# Patient Record
Sex: Female | Born: 1940
Health system: Southern US, Community
[De-identification: ages and names within clinical notes are randomized; demographics above are authoritative.]

## PROBLEM LIST (undated history)

## (undated) DIAGNOSIS — J45909 Unspecified asthma, uncomplicated: Secondary | ICD-10-CM

## (undated) DIAGNOSIS — S91105A Unspecified open wound of left lesser toe(s) without damage to nail, initial encounter: Secondary | ICD-10-CM

## (undated) DIAGNOSIS — K604 Rectal fistula, unspecified: Secondary | ICD-10-CM

## (undated) DIAGNOSIS — H409 Unspecified glaucoma: Secondary | ICD-10-CM

## (undated) DIAGNOSIS — F419 Anxiety disorder, unspecified: Secondary | ICD-10-CM

## (undated) DIAGNOSIS — R0600 Dyspnea, unspecified: Secondary | ICD-10-CM

## (undated) DIAGNOSIS — K589 Irritable bowel syndrome without diarrhea: Secondary | ICD-10-CM

## (undated) DIAGNOSIS — L309 Dermatitis, unspecified: Secondary | ICD-10-CM

## (undated) DIAGNOSIS — M419 Scoliosis, unspecified: Secondary | ICD-10-CM

## (undated) DIAGNOSIS — T7840XA Allergy, unspecified, initial encounter: Secondary | ICD-10-CM

## (undated) DIAGNOSIS — C50919 Malignant neoplasm of unspecified site of unspecified female breast: Secondary | ICD-10-CM

## (undated) DIAGNOSIS — Z9889 Other specified postprocedural states: Secondary | ICD-10-CM

## (undated) DIAGNOSIS — K760 Fatty (change of) liver, not elsewhere classified: Secondary | ICD-10-CM

## (undated) DIAGNOSIS — K5792 Diverticulitis of intestine, part unspecified, without perforation or abscess without bleeding: Secondary | ICD-10-CM

## (undated) DIAGNOSIS — R112 Nausea with vomiting, unspecified: Secondary | ICD-10-CM

## (undated) DIAGNOSIS — G629 Polyneuropathy, unspecified: Secondary | ICD-10-CM

## (undated) DIAGNOSIS — M8580 Other specified disorders of bone density and structure, unspecified site: Secondary | ICD-10-CM

## (undated) DIAGNOSIS — R531 Weakness: Secondary | ICD-10-CM

## (undated) DIAGNOSIS — D649 Anemia, unspecified: Secondary | ICD-10-CM

## (undated) DIAGNOSIS — M2040 Other hammer toe(s) (acquired), unspecified foot: Secondary | ICD-10-CM

## (undated) DIAGNOSIS — G479 Sleep disorder, unspecified: Secondary | ICD-10-CM

## (undated) DIAGNOSIS — E559 Vitamin D deficiency, unspecified: Secondary | ICD-10-CM

## (undated) DIAGNOSIS — B029 Zoster without complications: Secondary | ICD-10-CM

## (undated) DIAGNOSIS — N200 Calculus of kidney: Secondary | ICD-10-CM

## (undated) DIAGNOSIS — Q398 Other congenital malformations of esophagus: Secondary | ICD-10-CM

## (undated) DIAGNOSIS — I10 Essential (primary) hypertension: Secondary | ICD-10-CM

## (undated) DIAGNOSIS — N809 Endometriosis, unspecified: Secondary | ICD-10-CM

## (undated) DIAGNOSIS — E1169 Type 2 diabetes mellitus with other specified complication: Secondary | ICD-10-CM

## (undated) DIAGNOSIS — K2289 Other specified disease of esophagus: Secondary | ICD-10-CM

## (undated) DIAGNOSIS — M2550 Pain in unspecified joint: Secondary | ICD-10-CM

## (undated) DIAGNOSIS — Z87442 Personal history of urinary calculi: Secondary | ICD-10-CM

## (undated) DIAGNOSIS — G63 Polyneuropathy in diseases classified elsewhere: Secondary | ICD-10-CM

## (undated) DIAGNOSIS — H269 Unspecified cataract: Secondary | ICD-10-CM

## (undated) DIAGNOSIS — G2581 Restless legs syndrome: Secondary | ICD-10-CM

## (undated) DIAGNOSIS — M1990 Unspecified osteoarthritis, unspecified site: Secondary | ICD-10-CM

## (undated) DIAGNOSIS — C801 Malignant (primary) neoplasm, unspecified: Secondary | ICD-10-CM

## (undated) DIAGNOSIS — F329 Major depressive disorder, single episode, unspecified: Secondary | ICD-10-CM

## (undated) DIAGNOSIS — S32009A Unspecified fracture of unspecified lumbar vertebra, initial encounter for closed fracture: Secondary | ICD-10-CM

## (undated) DIAGNOSIS — M76829 Posterior tibial tendinitis, unspecified leg: Secondary | ICD-10-CM

## (undated) DIAGNOSIS — M869 Osteomyelitis, unspecified: Secondary | ICD-10-CM

## (undated) DIAGNOSIS — Q399 Congenital malformation of esophagus, unspecified: Secondary | ICD-10-CM

## (undated) DIAGNOSIS — K2970 Gastritis, unspecified, without bleeding: Secondary | ICD-10-CM

## (undated) DIAGNOSIS — R51 Headache: Secondary | ICD-10-CM

## (undated) DIAGNOSIS — E079 Disorder of thyroid, unspecified: Secondary | ICD-10-CM

## (undated) DIAGNOSIS — J479 Bronchiectasis, uncomplicated: Secondary | ICD-10-CM

## (undated) DIAGNOSIS — F32A Depression, unspecified: Secondary | ICD-10-CM

## (undated) HISTORY — DX: Gastritis, unspecified, without bleeding: K29.70

## (undated) HISTORY — PX: FOOT SURGERY: SHX648

## (undated) HISTORY — PX: OTHER SURGICAL HISTORY: SHX169

## (undated) HISTORY — PX: ADENOIDECTOMY: SUR15

## (undated) HISTORY — PX: DILATION AND CURETTAGE OF UTERUS: SHX78

## (undated) HISTORY — DX: Endometriosis, unspecified: N80.9

## (undated) HISTORY — DX: Disorder of thyroid, unspecified: E07.9

## (undated) HISTORY — PX: TONSILLECTOMY: SUR1361

## (undated) HISTORY — DX: Weakness: R53.1

## (undated) HISTORY — PX: HYPERBARIC OXYGEN THERAPY: SHX673

## (undated) HISTORY — DX: Polyneuropathy, unspecified: G62.9

## (undated) HISTORY — DX: Anemia, unspecified: D64.9

## (undated) HISTORY — PX: THYROIDECTOMY: SHX17

## (undated) HISTORY — PX: MASTECTOMY: SHX3

## (undated) HISTORY — DX: Anxiety disorder, unspecified: F41.9

## (undated) HISTORY — DX: Diverticulitis of intestine, part unspecified, without perforation or abscess without bleeding: K57.92

## (undated) HISTORY — DX: Zoster without complications: B02.9

## (undated) HISTORY — DX: Allergy, unspecified, initial encounter: T78.40XA

## (undated) HISTORY — PX: JOINT REPLACEMENT: SHX530

## (undated) HISTORY — PX: LAPAROTOMY: SHX154

## (undated) HISTORY — DX: Unspecified fracture of unspecified lumbar vertebra, initial encounter for closed fracture: S32.009A

## (undated) HISTORY — DX: Essential (primary) hypertension: I10

## (undated) HISTORY — PX: CATARACT EXTRACTION: SUR2

## (undated) HISTORY — DX: Calculus of kidney: N20.0

---
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
## 1973-05-10 HISTORY — PX: THYROIDECTOMY: SHX17

## 1975-05-11 HISTORY — PX: LAPAROTOMY: SHX154

## 1997-10-18 ENCOUNTER — Ambulatory Visit (HOSPITAL_COMMUNITY): Admission: RE | Admit: 1997-10-18 | Discharge: 1997-10-18 | Payer: Self-pay | Admitting: *Deleted

## 1998-09-08 ENCOUNTER — Other Ambulatory Visit: Admission: RE | Admit: 1998-09-08 | Discharge: 1998-09-08 | Payer: Self-pay | Admitting: *Deleted

## 1998-11-20 ENCOUNTER — Ambulatory Visit (HOSPITAL_COMMUNITY): Admission: RE | Admit: 1998-11-20 | Discharge: 1998-11-20 | Payer: Self-pay | Admitting: Internal Medicine

## 1999-07-02 ENCOUNTER — Ambulatory Visit (HOSPITAL_COMMUNITY): Admission: RE | Admit: 1999-07-02 | Discharge: 1999-07-02 | Payer: Self-pay | Admitting: Surgery

## 1999-07-02 ENCOUNTER — Encounter: Payer: Self-pay | Admitting: Surgery

## 1999-07-08 ENCOUNTER — Ambulatory Visit (HOSPITAL_COMMUNITY): Admission: RE | Admit: 1999-07-08 | Discharge: 1999-07-08 | Payer: Self-pay | Admitting: Surgery

## 1999-07-08 ENCOUNTER — Encounter: Payer: Self-pay | Admitting: Surgery

## 1999-07-13 ENCOUNTER — Encounter (INDEPENDENT_AMBULATORY_CARE_PROVIDER_SITE_OTHER): Payer: Self-pay

## 1999-07-13 ENCOUNTER — Ambulatory Visit (HOSPITAL_COMMUNITY): Admission: RE | Admit: 1999-07-13 | Discharge: 1999-07-13 | Payer: Self-pay | Admitting: Surgery

## 1999-09-11 ENCOUNTER — Ambulatory Visit (HOSPITAL_COMMUNITY): Admission: RE | Admit: 1999-09-11 | Discharge: 1999-09-11 | Payer: Self-pay | Admitting: Surgery

## 1999-09-11 ENCOUNTER — Encounter: Payer: Self-pay | Admitting: Surgery

## 1999-09-21 ENCOUNTER — Ambulatory Visit (HOSPITAL_COMMUNITY): Admission: RE | Admit: 1999-09-21 | Discharge: 1999-09-21 | Payer: Self-pay | Admitting: Surgery

## 1999-09-21 ENCOUNTER — Encounter: Payer: Self-pay | Admitting: Surgery

## 1999-09-25 ENCOUNTER — Other Ambulatory Visit: Admission: RE | Admit: 1999-09-25 | Discharge: 1999-09-25 | Payer: Self-pay | Admitting: *Deleted

## 2000-02-19 ENCOUNTER — Emergency Department (HOSPITAL_COMMUNITY): Admission: EM | Admit: 2000-02-19 | Discharge: 2000-02-19 | Payer: Self-pay | Admitting: Emergency Medicine

## 2000-03-24 ENCOUNTER — Encounter (INDEPENDENT_AMBULATORY_CARE_PROVIDER_SITE_OTHER): Payer: Self-pay | Admitting: Specialist

## 2000-03-24 ENCOUNTER — Ambulatory Visit (HOSPITAL_BASED_OUTPATIENT_CLINIC_OR_DEPARTMENT_OTHER): Admission: RE | Admit: 2000-03-24 | Discharge: 2000-03-24 | Payer: Self-pay | Admitting: Surgery

## 2000-09-28 ENCOUNTER — Other Ambulatory Visit: Admission: RE | Admit: 2000-09-28 | Discharge: 2000-09-28 | Payer: Self-pay | Admitting: *Deleted

## 2001-01-08 ENCOUNTER — Emergency Department (HOSPITAL_COMMUNITY): Admission: EM | Admit: 2001-01-08 | Discharge: 2001-01-08 | Payer: Self-pay | Admitting: Emergency Medicine

## 2001-07-12 ENCOUNTER — Encounter: Admission: RE | Admit: 2001-07-12 | Discharge: 2001-10-10 | Payer: Self-pay | Admitting: Internal Medicine

## 2001-10-04 ENCOUNTER — Other Ambulatory Visit: Admission: RE | Admit: 2001-10-04 | Discharge: 2001-10-04 | Payer: Self-pay | Admitting: *Deleted

## 2001-10-09 ENCOUNTER — Ambulatory Visit (HOSPITAL_COMMUNITY): Admission: RE | Admit: 2001-10-09 | Discharge: 2001-10-09 | Payer: Self-pay | Admitting: Oncology

## 2001-10-09 ENCOUNTER — Encounter: Payer: Self-pay | Admitting: Oncology

## 2002-10-10 ENCOUNTER — Other Ambulatory Visit: Admission: RE | Admit: 2002-10-10 | Discharge: 2002-10-10 | Payer: Self-pay | Admitting: *Deleted

## 2002-10-26 ENCOUNTER — Emergency Department (HOSPITAL_COMMUNITY): Admission: EM | Admit: 2002-10-26 | Discharge: 2002-10-26 | Payer: Self-pay | Admitting: Emergency Medicine

## 2002-11-02 ENCOUNTER — Encounter: Payer: Self-pay | Admitting: Internal Medicine

## 2002-11-02 ENCOUNTER — Encounter: Admission: RE | Admit: 2002-11-02 | Discharge: 2002-11-02 | Payer: Self-pay | Admitting: Internal Medicine

## 2002-11-05 ENCOUNTER — Encounter: Admission: RE | Admit: 2002-11-05 | Discharge: 2002-11-05 | Payer: Self-pay | Admitting: Neurosurgery

## 2003-10-11 ENCOUNTER — Other Ambulatory Visit: Admission: RE | Admit: 2003-10-11 | Discharge: 2003-10-11 | Payer: Self-pay | Admitting: *Deleted

## 2004-01-17 ENCOUNTER — Encounter: Admission: RE | Admit: 2004-01-17 | Discharge: 2004-01-17 | Payer: Self-pay | Admitting: Internal Medicine

## 2004-01-26 ENCOUNTER — Encounter: Admission: RE | Admit: 2004-01-26 | Discharge: 2004-01-26 | Payer: Self-pay | Admitting: Internal Medicine

## 2004-03-19 ENCOUNTER — Ambulatory Visit: Payer: Self-pay | Admitting: Internal Medicine

## 2004-06-15 ENCOUNTER — Ambulatory Visit: Payer: Self-pay | Admitting: Internal Medicine

## 2004-09-11 ENCOUNTER — Ambulatory Visit: Payer: Self-pay | Admitting: Internal Medicine

## 2004-10-01 ENCOUNTER — Ambulatory Visit: Payer: Self-pay | Admitting: Internal Medicine

## 2004-11-03 ENCOUNTER — Ambulatory Visit: Payer: Self-pay | Admitting: Internal Medicine

## 2004-11-12 ENCOUNTER — Ambulatory Visit: Payer: Self-pay | Admitting: Internal Medicine

## 2004-12-01 ENCOUNTER — Ambulatory Visit: Admission: RE | Admit: 2004-12-01 | Discharge: 2004-12-01 | Payer: Self-pay | Admitting: Gynecologic Oncology

## 2004-12-08 HISTORY — PX: LAPAROSCOPIC OOPHORECTOMY: SUR783

## 2004-12-22 ENCOUNTER — Ambulatory Visit (HOSPITAL_COMMUNITY): Admission: RE | Admit: 2004-12-22 | Discharge: 2004-12-22 | Payer: Self-pay | Admitting: Gynecologic Oncology

## 2004-12-22 ENCOUNTER — Encounter (INDEPENDENT_AMBULATORY_CARE_PROVIDER_SITE_OTHER): Payer: Self-pay | Admitting: *Deleted

## 2005-01-19 ENCOUNTER — Ambulatory Visit: Admission: RE | Admit: 2005-01-19 | Discharge: 2005-01-19 | Payer: Self-pay | Admitting: Gynecologic Oncology

## 2005-02-01 ENCOUNTER — Ambulatory Visit: Payer: Self-pay | Admitting: Internal Medicine

## 2005-02-08 ENCOUNTER — Ambulatory Visit: Payer: Self-pay | Admitting: Internal Medicine

## 2005-02-26 ENCOUNTER — Encounter: Admission: RE | Admit: 2005-02-26 | Discharge: 2005-02-26 | Payer: Self-pay | Admitting: Neurosurgery

## 2005-06-29 ENCOUNTER — Ambulatory Visit: Payer: Self-pay | Admitting: Internal Medicine

## 2005-07-12 ENCOUNTER — Ambulatory Visit: Payer: Self-pay | Admitting: Internal Medicine

## 2005-10-11 ENCOUNTER — Ambulatory Visit: Payer: Self-pay | Admitting: Internal Medicine

## 2005-10-14 ENCOUNTER — Other Ambulatory Visit: Admission: RE | Admit: 2005-10-14 | Discharge: 2005-10-14 | Payer: Self-pay | Admitting: Obstetrics & Gynecology

## 2005-10-22 ENCOUNTER — Ambulatory Visit: Payer: Self-pay | Admitting: Internal Medicine

## 2005-11-02 ENCOUNTER — Ambulatory Visit: Payer: Self-pay | Admitting: Internal Medicine

## 2005-11-02 HISTORY — PX: COLONOSCOPY: SHX174

## 2005-12-21 ENCOUNTER — Ambulatory Visit: Payer: Self-pay | Admitting: Internal Medicine

## 2005-12-28 ENCOUNTER — Ambulatory Visit: Payer: Self-pay | Admitting: Internal Medicine

## 2006-03-15 ENCOUNTER — Ambulatory Visit: Payer: Self-pay | Admitting: Internal Medicine

## 2006-03-22 ENCOUNTER — Ambulatory Visit: Payer: Self-pay | Admitting: Internal Medicine

## 2006-05-10 HISTORY — PX: OTHER SURGICAL HISTORY: SHX169

## 2006-07-22 ENCOUNTER — Ambulatory Visit: Payer: Self-pay | Admitting: Internal Medicine

## 2006-08-01 ENCOUNTER — Ambulatory Visit: Payer: Self-pay | Admitting: Internal Medicine

## 2006-08-01 LAB — CONVERTED CEMR LAB
Basophils Absolute: 0.1 10*3/uL (ref 0.0–0.1)
Eosinophils Relative: 2.3 % (ref 0.0–5.0)
HCT: 40.7 % (ref 36.0–46.0)
Hemoglobin: 13.5 g/dL (ref 12.0–15.0)
MCHC: 33.2 g/dL (ref 30.0–36.0)
MCV: 91.3 fL (ref 78.0–100.0)
Monocytes Absolute: 0.6 10*3/uL (ref 0.2–0.7)
Monocytes Relative: 7.5 % (ref 3.0–11.0)
Neutro Abs: 4.7 10*3/uL (ref 1.4–7.7)
Neutrophils Relative %: 64.3 % (ref 43.0–77.0)
Platelets: 280 10*3/uL (ref 150–400)
T4, Total: 7.9 ug/dL (ref 5.0–12.5)

## 2006-08-24 ENCOUNTER — Ambulatory Visit (HOSPITAL_BASED_OUTPATIENT_CLINIC_OR_DEPARTMENT_OTHER): Admission: RE | Admit: 2006-08-24 | Discharge: 2006-08-24 | Payer: Self-pay | Admitting: Internal Medicine

## 2006-09-06 ENCOUNTER — Ambulatory Visit: Payer: Self-pay | Admitting: Pulmonary Disease

## 2006-09-16 ENCOUNTER — Ambulatory Visit: Payer: Self-pay | Admitting: Internal Medicine

## 2006-10-20 ENCOUNTER — Other Ambulatory Visit: Admission: RE | Admit: 2006-10-20 | Discharge: 2006-10-20 | Payer: Self-pay | Admitting: Obstetrics and Gynecology

## 2006-10-28 ENCOUNTER — Ambulatory Visit: Payer: Self-pay | Admitting: Internal Medicine

## 2006-12-12 DIAGNOSIS — Z853 Personal history of malignant neoplasm of breast: Secondary | ICD-10-CM | POA: Insufficient documentation

## 2006-12-12 DIAGNOSIS — N951 Menopausal and female climacteric states: Secondary | ICD-10-CM | POA: Insufficient documentation

## 2006-12-12 DIAGNOSIS — M810 Age-related osteoporosis without current pathological fracture: Secondary | ICD-10-CM | POA: Insufficient documentation

## 2006-12-28 ENCOUNTER — Ambulatory Visit: Payer: Self-pay | Admitting: Internal Medicine

## 2006-12-28 DIAGNOSIS — G609 Hereditary and idiopathic neuropathy, unspecified: Secondary | ICD-10-CM | POA: Insufficient documentation

## 2006-12-28 DIAGNOSIS — E1129 Type 2 diabetes mellitus with other diabetic kidney complication: Secondary | ICD-10-CM | POA: Insufficient documentation

## 2006-12-28 DIAGNOSIS — E119 Type 2 diabetes mellitus without complications: Secondary | ICD-10-CM | POA: Insufficient documentation

## 2006-12-28 DIAGNOSIS — G608 Other hereditary and idiopathic neuropathies: Secondary | ICD-10-CM | POA: Insufficient documentation

## 2006-12-28 DIAGNOSIS — E1165 Type 2 diabetes mellitus with hyperglycemia: Secondary | ICD-10-CM | POA: Insufficient documentation

## 2006-12-28 DIAGNOSIS — G2581 Restless legs syndrome: Secondary | ICD-10-CM | POA: Insufficient documentation

## 2006-12-28 LAB — CONVERTED CEMR LAB
Folate: >20 ng/mL
Hgb A1c MFr Bld: 6.3 % — ABNORMAL HIGH (ref 4.6–6.0)

## 2006-12-29 ENCOUNTER — Ambulatory Visit: Payer: Self-pay | Admitting: Internal Medicine

## 2006-12-29 DIAGNOSIS — D518 Other vitamin B12 deficiency anemias: Secondary | ICD-10-CM | POA: Insufficient documentation

## 2007-01-12 ENCOUNTER — Ambulatory Visit: Payer: Self-pay | Admitting: Internal Medicine

## 2007-01-26 ENCOUNTER — Ambulatory Visit: Payer: Self-pay | Admitting: Internal Medicine

## 2007-02-02 ENCOUNTER — Encounter: Payer: Self-pay | Admitting: Internal Medicine

## 2007-02-21 ENCOUNTER — Encounter: Payer: Self-pay | Admitting: Internal Medicine

## 2007-02-21 ENCOUNTER — Encounter: Admission: RE | Admit: 2007-02-21 | Discharge: 2007-03-22 | Payer: Self-pay | Admitting: Internal Medicine

## 2007-03-01 ENCOUNTER — Ambulatory Visit: Payer: Self-pay | Admitting: Internal Medicine

## 2007-03-07 ENCOUNTER — Ambulatory Visit: Payer: Self-pay | Admitting: Internal Medicine

## 2007-03-07 LAB — CONVERTED CEMR LAB
Folate: >20 ng/mL
Hgb A1c MFr Bld: 6.5 % — ABNORMAL HIGH (ref 4.6–6.0)

## 2007-03-22 ENCOUNTER — Encounter: Payer: Self-pay | Admitting: Internal Medicine

## 2007-03-22 ENCOUNTER — Telehealth: Payer: Self-pay | Admitting: Internal Medicine

## 2007-03-30 ENCOUNTER — Ambulatory Visit: Payer: Self-pay | Admitting: Internal Medicine

## 2007-05-02 ENCOUNTER — Ambulatory Visit: Payer: Self-pay | Admitting: Internal Medicine

## 2007-05-02 LAB — CONVERTED CEMR LAB: Microalb Creat Ratio: 28.1 mg/g (ref 0.0–30.0)

## 2007-05-29 ENCOUNTER — Telehealth: Payer: Self-pay | Admitting: Internal Medicine

## 2007-06-02 ENCOUNTER — Ambulatory Visit: Payer: Self-pay | Admitting: Internal Medicine

## 2007-06-08 ENCOUNTER — Encounter: Payer: Self-pay | Admitting: Internal Medicine

## 2007-07-03 ENCOUNTER — Ambulatory Visit: Payer: Self-pay | Admitting: Internal Medicine

## 2007-07-18 ENCOUNTER — Ambulatory Visit: Payer: Self-pay | Admitting: Internal Medicine

## 2007-07-20 ENCOUNTER — Encounter: Admission: RE | Admit: 2007-07-20 | Discharge: 2007-07-20 | Payer: Self-pay | Admitting: Internal Medicine

## 2007-07-21 LAB — CONVERTED CEMR LAB
ALT: 21 units/L (ref 0–35)
AST: 18 units/L (ref 0–37)
Albumin: 3.7 g/dL (ref 3.5–5.2)
Alkaline Phosphatase: 70 units/L (ref 39–117)
Basophils Absolute: 0.1 10*3/uL (ref 0.0–0.1)
Basophils Relative: 0.7 % (ref 0.0–1.0)
Bilirubin, Direct: 0.1 mg/dL (ref 0.0–0.3)
Eosinophils Absolute: 0.2 10*3/uL (ref 0.0–0.6)
Eosinophils Relative: 3 % (ref 0.0–5.0)
Neutro Abs: 5 10*3/uL (ref 1.4–7.7)
Total Bilirubin: 0.5 mg/dL (ref 0.3–1.2)

## 2007-07-26 ENCOUNTER — Ambulatory Visit: Payer: Self-pay | Admitting: Internal Medicine

## 2007-07-26 DIAGNOSIS — E1169 Type 2 diabetes mellitus with other specified complication: Secondary | ICD-10-CM

## 2007-07-26 DIAGNOSIS — E785 Hyperlipidemia, unspecified: Secondary | ICD-10-CM | POA: Insufficient documentation

## 2007-07-26 HISTORY — DX: Type 2 diabetes mellitus with other specified complication: E11.69

## 2007-07-26 LAB — CONVERTED CEMR LAB
HDL: 43 mg/dL (ref >39.0)
LDL Cholesterol: 105 mg/dL — ABNORMAL HIGH (ref 0–99)
Total CHOL/HDL Ratio: 3.9
Triglycerides: 103 mg/dL (ref 0–149)

## 2007-08-01 ENCOUNTER — Telehealth: Payer: Self-pay | Admitting: Internal Medicine

## 2007-08-01 DIAGNOSIS — K8020 Calculus of gallbladder without cholecystitis without obstruction: Secondary | ICD-10-CM | POA: Insufficient documentation

## 2007-08-08 ENCOUNTER — Ambulatory Visit: Payer: Self-pay | Admitting: Internal Medicine

## 2007-08-09 ENCOUNTER — Ambulatory Visit: Payer: Self-pay | Admitting: Internal Medicine

## 2007-08-09 DIAGNOSIS — T887XXA Unspecified adverse effect of drug or medicament, initial encounter: Secondary | ICD-10-CM | POA: Insufficient documentation

## 2007-08-09 LAB — CONVERTED CEMR LAB: Creatinine, Ser: 0.8 mg/dL (ref 0.4–1.2)

## 2007-08-10 ENCOUNTER — Ambulatory Visit: Payer: Self-pay | Admitting: Internal Medicine

## 2007-08-14 ENCOUNTER — Telehealth: Payer: Self-pay | Admitting: Internal Medicine

## 2007-08-29 ENCOUNTER — Ambulatory Visit: Payer: Self-pay | Admitting: Internal Medicine

## 2007-09-20 ENCOUNTER — Ambulatory Visit: Payer: Self-pay | Admitting: Internal Medicine

## 2007-09-27 ENCOUNTER — Telehealth: Payer: Self-pay | Admitting: Internal Medicine

## 2007-10-06 ENCOUNTER — Encounter: Payer: Self-pay | Admitting: Internal Medicine

## 2007-10-16 ENCOUNTER — Ambulatory Visit: Payer: Self-pay | Admitting: Internal Medicine

## 2007-10-26 ENCOUNTER — Encounter: Payer: Self-pay | Admitting: Internal Medicine

## 2007-10-31 ENCOUNTER — Ambulatory Visit: Payer: Self-pay | Admitting: Internal Medicine

## 2007-10-31 LAB — CONVERTED CEMR LAB: Vitamin B-12: 400 pg/mL (ref 211–911)

## 2007-11-07 ENCOUNTER — Ambulatory Visit: Payer: Self-pay | Admitting: Internal Medicine

## 2007-11-13 ENCOUNTER — Telehealth: Payer: Self-pay | Admitting: Internal Medicine

## 2007-11-22 ENCOUNTER — Ambulatory Visit: Payer: Self-pay | Admitting: Internal Medicine

## 2007-11-30 ENCOUNTER — Telehealth: Payer: Self-pay

## 2007-12-08 ENCOUNTER — Observation Stay (HOSPITAL_COMMUNITY): Admission: EM | Admit: 2007-12-08 | Discharge: 2007-12-09 | Payer: Self-pay | Admitting: Emergency Medicine

## 2007-12-08 ENCOUNTER — Ambulatory Visit: Payer: Self-pay | Admitting: Internal Medicine

## 2007-12-11 ENCOUNTER — Encounter: Payer: Self-pay | Admitting: Internal Medicine

## 2007-12-13 ENCOUNTER — Ambulatory Visit: Payer: Self-pay | Admitting: Internal Medicine

## 2007-12-20 ENCOUNTER — Encounter: Admission: RE | Admit: 2007-12-20 | Discharge: 2007-12-20 | Payer: Self-pay | Admitting: Specialist

## 2007-12-20 ENCOUNTER — Encounter: Payer: Self-pay | Admitting: Internal Medicine

## 2007-12-25 ENCOUNTER — Ambulatory Visit: Payer: Self-pay | Admitting: Internal Medicine

## 2007-12-25 DIAGNOSIS — K219 Gastro-esophageal reflux disease without esophagitis: Secondary | ICD-10-CM | POA: Insufficient documentation

## 2007-12-27 ENCOUNTER — Encounter: Payer: Self-pay | Admitting: Internal Medicine

## 2008-01-03 ENCOUNTER — Ambulatory Visit: Payer: Self-pay | Admitting: Internal Medicine

## 2008-01-24 ENCOUNTER — Ambulatory Visit: Payer: Self-pay | Admitting: Internal Medicine

## 2008-02-13 ENCOUNTER — Ambulatory Visit: Payer: Self-pay | Admitting: Internal Medicine

## 2008-03-05 ENCOUNTER — Ambulatory Visit: Payer: Self-pay | Admitting: Internal Medicine

## 2008-03-06 ENCOUNTER — Encounter: Payer: Self-pay | Admitting: Internal Medicine

## 2008-03-14 ENCOUNTER — Telehealth: Payer: Self-pay | Admitting: Internal Medicine

## 2008-03-14 ENCOUNTER — Encounter: Payer: Self-pay | Admitting: Internal Medicine

## 2008-03-19 ENCOUNTER — Ambulatory Visit: Payer: Self-pay | Admitting: Internal Medicine

## 2008-03-19 LAB — CONVERTED CEMR LAB
BUN: 18 mg/dL (ref 6–23)
Calcium: 9.3 mg/dL (ref 8.4–10.5)
Chloride: 108 meq/L (ref 96–112)
GFR calc Af Amer: 107 mL/min
Potassium: 4.8 meq/L (ref 3.5–5.1)
Sodium: 145 meq/L (ref 135–145)

## 2008-03-27 ENCOUNTER — Ambulatory Visit: Payer: Self-pay | Admitting: Internal Medicine

## 2008-04-19 ENCOUNTER — Ambulatory Visit: Payer: Self-pay | Admitting: Internal Medicine

## 2008-05-15 ENCOUNTER — Ambulatory Visit: Payer: Self-pay | Admitting: Internal Medicine

## 2008-06-05 ENCOUNTER — Ambulatory Visit: Payer: Self-pay | Admitting: Internal Medicine

## 2008-06-10 HISTORY — PX: CHOLECYSTECTOMY: SHX55

## 2008-06-26 ENCOUNTER — Ambulatory Visit: Payer: Self-pay | Admitting: Internal Medicine

## 2008-07-02 ENCOUNTER — Encounter (INDEPENDENT_AMBULATORY_CARE_PROVIDER_SITE_OTHER): Payer: Self-pay | Admitting: Surgery

## 2008-07-02 ENCOUNTER — Ambulatory Visit (HOSPITAL_COMMUNITY): Admission: RE | Admit: 2008-07-02 | Discharge: 2008-07-03 | Payer: Self-pay | Admitting: Surgery

## 2008-07-18 ENCOUNTER — Ambulatory Visit: Payer: Self-pay | Admitting: Internal Medicine

## 2008-08-07 ENCOUNTER — Ambulatory Visit: Payer: Self-pay | Admitting: Internal Medicine

## 2008-08-07 LAB — CONVERTED CEMR LAB
Albumin: 3.6 g/dL (ref 3.5–5.2)
Alkaline Phosphatase: 86 units/L (ref 39–117)
BUN: 18 mg/dL (ref 6–23)
CO2: 30 meq/L (ref 19–32)
Chloride: 107 meq/L (ref 96–112)
Cholesterol: 168 mg/dL (ref 0–200)
Creatinine, Ser: 0.7 mg/dL (ref 0.4–1.2)
GFR calc non Af Amer: 88.3 mL/min (ref >60)
Glucose, Bld: 132 mg/dL — ABNORMAL HIGH (ref 70–99)
HDL: 52.7 mg/dL (ref >39.00)
Hgb A1c MFr Bld: 6.3 % (ref 4.6–6.5)
Potassium: 4 meq/L (ref 3.5–5.1)
TSH: 1.64 microintl units/mL (ref 0.35–5.50)
Total Protein: 6.7 g/dL (ref 6.0–8.3)

## 2008-08-12 ENCOUNTER — Encounter: Payer: Self-pay | Admitting: Internal Medicine

## 2008-08-12 ENCOUNTER — Encounter: Payer: Self-pay | Admitting: Nurse Practitioner

## 2008-08-14 ENCOUNTER — Ambulatory Visit: Payer: Self-pay | Admitting: Gastroenterology

## 2008-08-14 ENCOUNTER — Encounter: Payer: Self-pay | Admitting: Internal Medicine

## 2008-08-15 ENCOUNTER — Encounter: Payer: Self-pay | Admitting: Internal Medicine

## 2008-08-15 ENCOUNTER — Ambulatory Visit: Payer: Self-pay | Admitting: Internal Medicine

## 2008-08-17 ENCOUNTER — Encounter: Payer: Self-pay | Admitting: Internal Medicine

## 2008-08-21 ENCOUNTER — Ambulatory Visit: Payer: Self-pay | Admitting: Internal Medicine

## 2008-08-21 DIAGNOSIS — S32009A Unspecified fracture of unspecified lumbar vertebra, initial encounter for closed fracture: Secondary | ICD-10-CM | POA: Insufficient documentation

## 2008-08-21 HISTORY — DX: Unspecified fracture of unspecified lumbar vertebra, initial encounter for closed fracture: S32.009A

## 2008-08-21 LAB — CONVERTED CEMR LAB
HDL goal, serum: 40 mg/dL
LDL Goal: 100 mg/dL

## 2008-08-28 ENCOUNTER — Telehealth (INDEPENDENT_AMBULATORY_CARE_PROVIDER_SITE_OTHER): Payer: Self-pay | Admitting: *Deleted

## 2008-08-28 ENCOUNTER — Ambulatory Visit: Payer: Self-pay | Admitting: Internal Medicine

## 2008-08-28 ENCOUNTER — Telehealth: Payer: Self-pay | Admitting: Internal Medicine

## 2008-08-30 ENCOUNTER — Encounter: Payer: Self-pay | Admitting: Internal Medicine

## 2008-09-03 ENCOUNTER — Telehealth: Payer: Self-pay | Admitting: Internal Medicine

## 2008-09-09 ENCOUNTER — Encounter: Payer: Self-pay | Admitting: Internal Medicine

## 2008-09-18 ENCOUNTER — Ambulatory Visit: Payer: Self-pay | Admitting: Internal Medicine

## 2008-10-23 ENCOUNTER — Ambulatory Visit: Payer: Self-pay | Admitting: Internal Medicine

## 2008-11-20 ENCOUNTER — Ambulatory Visit: Payer: Self-pay | Admitting: Internal Medicine

## 2008-12-09 ENCOUNTER — Ambulatory Visit: Payer: Self-pay | Admitting: Internal Medicine

## 2008-12-16 ENCOUNTER — Telehealth (INDEPENDENT_AMBULATORY_CARE_PROVIDER_SITE_OTHER): Payer: Self-pay | Admitting: *Deleted

## 2008-12-16 ENCOUNTER — Ambulatory Visit: Payer: Self-pay | Admitting: Internal Medicine

## 2008-12-16 DIAGNOSIS — R10811 Right upper quadrant abdominal tenderness: Secondary | ICD-10-CM | POA: Insufficient documentation

## 2008-12-19 ENCOUNTER — Encounter: Admission: RE | Admit: 2008-12-19 | Discharge: 2008-12-19 | Payer: Self-pay | Admitting: Internal Medicine

## 2008-12-20 LAB — CONVERTED CEMR LAB
Alkaline Phosphatase: 72 units/L (ref 39–117)
Basophils Absolute: 0 10*3/uL (ref 0.0–0.1)
Bilirubin, Direct: 0.1 mg/dL (ref 0.0–0.3)
Eosinophils Absolute: 0.2 10*3/uL (ref 0.0–0.7)
Folate: >20 ng/mL
HCT: 37.8 % (ref 36.0–46.0)
Lymphocytes Relative: 25.5 % (ref 12.0–46.0)
Lymphs Abs: 1.6 10*3/uL (ref 0.7–4.0)
MCV: 92.1 fL (ref 78.0–100.0)
Neutrophils Relative %: 63.5 % (ref 43.0–77.0)
Platelets: 226 10*3/uL (ref 150.0–400.0)
RBC: 4.1 M/uL (ref 3.87–5.11)
Total Bilirubin: 0.5 mg/dL (ref 0.3–1.2)
Vitamin B-12: 418 pg/mL (ref 211–911)
WBC: 6.4 10*3/uL (ref 4.5–10.5)

## 2009-01-14 ENCOUNTER — Telehealth: Payer: Self-pay | Admitting: Internal Medicine

## 2009-01-15 ENCOUNTER — Ambulatory Visit: Payer: Self-pay | Admitting: Family Medicine

## 2009-02-05 ENCOUNTER — Ambulatory Visit: Payer: Self-pay | Admitting: Internal Medicine

## 2009-03-10 ENCOUNTER — Ambulatory Visit: Payer: Self-pay | Admitting: Internal Medicine

## 2009-03-18 ENCOUNTER — Ambulatory Visit: Payer: Self-pay | Admitting: Internal Medicine

## 2009-03-18 DIAGNOSIS — K760 Fatty (change of) liver, not elsewhere classified: Secondary | ICD-10-CM | POA: Insufficient documentation

## 2009-03-24 ENCOUNTER — Telehealth: Payer: Self-pay | Admitting: Internal Medicine

## 2009-03-31 ENCOUNTER — Ambulatory Visit: Payer: Self-pay | Admitting: Internal Medicine

## 2009-04-09 ENCOUNTER — Encounter (INDEPENDENT_AMBULATORY_CARE_PROVIDER_SITE_OTHER): Payer: Self-pay | Admitting: *Deleted

## 2009-04-16 ENCOUNTER — Ambulatory Visit: Payer: Self-pay | Admitting: Internal Medicine

## 2009-04-17 LAB — CONVERTED CEMR LAB
Bilirubin, Direct: 0 mg/dL (ref 0.0–0.3)
Direct LDL: 106.4 mg/dL
Pro B Natriuretic peptide (BNP): 22 pg/mL (ref 0.0–100.0)
Total Bilirubin: 0.5 mg/dL (ref 0.3–1.2)
Total Protein: 6.5 g/dL (ref 6.0–8.3)

## 2009-04-22 ENCOUNTER — Ambulatory Visit: Payer: Self-pay | Admitting: Internal Medicine

## 2009-05-21 ENCOUNTER — Ambulatory Visit: Payer: Self-pay | Admitting: Internal Medicine

## 2009-06-18 ENCOUNTER — Ambulatory Visit: Payer: Self-pay | Admitting: Internal Medicine

## 2009-07-16 ENCOUNTER — Ambulatory Visit: Payer: Self-pay | Admitting: Internal Medicine

## 2009-07-24 ENCOUNTER — Encounter: Payer: Self-pay | Admitting: Internal Medicine

## 2009-07-25 ENCOUNTER — Encounter: Payer: Self-pay | Admitting: Internal Medicine

## 2009-07-25 DIAGNOSIS — G4700 Insomnia, unspecified: Secondary | ICD-10-CM | POA: Insufficient documentation

## 2009-08-06 ENCOUNTER — Ambulatory Visit: Payer: Self-pay | Admitting: Internal Medicine

## 2009-08-27 ENCOUNTER — Ambulatory Visit: Payer: Self-pay | Admitting: Internal Medicine

## 2009-09-16 ENCOUNTER — Ambulatory Visit: Payer: Self-pay | Admitting: Internal Medicine

## 2009-09-16 ENCOUNTER — Telehealth: Payer: Self-pay | Admitting: Internal Medicine

## 2009-09-25 ENCOUNTER — Telehealth: Payer: Self-pay | Admitting: Internal Medicine

## 2009-09-26 ENCOUNTER — Encounter: Payer: Self-pay | Admitting: Internal Medicine

## 2009-10-07 ENCOUNTER — Telehealth: Payer: Self-pay | Admitting: Internal Medicine

## 2009-10-28 ENCOUNTER — Ambulatory Visit: Payer: Self-pay | Admitting: Internal Medicine

## 2009-11-18 ENCOUNTER — Ambulatory Visit: Payer: Self-pay | Admitting: Internal Medicine

## 2009-11-26 ENCOUNTER — Ambulatory Visit: Payer: Self-pay | Admitting: Family Medicine

## 2009-12-10 ENCOUNTER — Ambulatory Visit: Payer: Self-pay | Admitting: Internal Medicine

## 2009-12-12 ENCOUNTER — Emergency Department (HOSPITAL_COMMUNITY): Admission: EM | Admit: 2009-12-12 | Discharge: 2009-12-12 | Payer: Self-pay | Admitting: Emergency Medicine

## 2009-12-16 ENCOUNTER — Emergency Department (HOSPITAL_COMMUNITY): Admission: EM | Admit: 2009-12-16 | Discharge: 2009-12-16 | Payer: Self-pay | Admitting: Family Medicine

## 2009-12-22 ENCOUNTER — Encounter (HOSPITAL_BASED_OUTPATIENT_CLINIC_OR_DEPARTMENT_OTHER)
Admission: RE | Admit: 2009-12-22 | Discharge: 2010-02-07 | Payer: Self-pay | Source: Home / Self Care | Admitting: General Surgery

## 2009-12-24 ENCOUNTER — Ambulatory Visit (HOSPITAL_COMMUNITY): Admission: RE | Admit: 2009-12-24 | Discharge: 2009-12-24 | Payer: Self-pay | Admitting: General Surgery

## 2009-12-25 ENCOUNTER — Ambulatory Visit: Payer: Self-pay | Admitting: Internal Medicine

## 2009-12-26 ENCOUNTER — Ambulatory Visit: Payer: Self-pay | Admitting: Surgery

## 2010-01-05 ENCOUNTER — Ambulatory Visit (HOSPITAL_COMMUNITY): Admission: RE | Admit: 2010-01-05 | Discharge: 2010-01-05 | Payer: Self-pay | Admitting: General Surgery

## 2010-01-20 ENCOUNTER — Ambulatory Visit: Payer: Self-pay | Admitting: Internal Medicine

## 2010-02-09 ENCOUNTER — Encounter (HOSPITAL_BASED_OUTPATIENT_CLINIC_OR_DEPARTMENT_OTHER): Admission: RE | Admit: 2010-02-09 | Discharge: 2010-03-03 | Payer: Self-pay | Admitting: General Surgery

## 2010-02-10 ENCOUNTER — Ambulatory Visit: Payer: Self-pay | Admitting: Internal Medicine

## 2010-03-03 ENCOUNTER — Ambulatory Visit: Payer: Self-pay | Admitting: Internal Medicine

## 2010-03-09 ENCOUNTER — Encounter (HOSPITAL_BASED_OUTPATIENT_CLINIC_OR_DEPARTMENT_OTHER)
Admission: RE | Admit: 2010-03-09 | Discharge: 2010-04-10 | Payer: Self-pay | Source: Home / Self Care | Admitting: Internal Medicine

## 2010-03-26 ENCOUNTER — Ambulatory Visit: Payer: Self-pay | Admitting: Internal Medicine

## 2010-04-23 ENCOUNTER — Encounter: Payer: Self-pay | Admitting: Internal Medicine

## 2010-04-28 ENCOUNTER — Ambulatory Visit: Payer: Self-pay | Admitting: Internal Medicine

## 2010-05-19 ENCOUNTER — Ambulatory Visit
Admission: RE | Admit: 2010-05-19 | Discharge: 2010-05-19 | Payer: Self-pay | Source: Home / Self Care | Attending: Internal Medicine | Admitting: Internal Medicine

## 2010-05-31 ENCOUNTER — Encounter: Payer: Self-pay | Admitting: Neurosurgery

## 2010-05-31 ENCOUNTER — Encounter (HOSPITAL_BASED_OUTPATIENT_CLINIC_OR_DEPARTMENT_OTHER): Payer: Self-pay | Admitting: General Surgery

## 2010-06-09 ENCOUNTER — Ambulatory Visit
Admission: RE | Admit: 2010-06-09 | Discharge: 2010-06-09 | Payer: Self-pay | Source: Home / Self Care | Attending: Internal Medicine | Admitting: Internal Medicine

## 2010-06-09 NOTE — Assessment & Plan Note (Signed)
Summary: severe joint pain in foot/very sore and red/swollen/hurts to ...   Vital Signs:  Patient profile:   70 year old female Weight:      229 pounds Temp:     98 degrees F oral BP sitting:   140 / 80  (left arm) Cuff size:   large  Vitals Entered By: Sid Falcon LPN (November 26, 2009 12:08 PM)  History of Present Illness: Patient seen with one-week history of left foot pain.  No injury. Started after some increased ambulation. Pain is centered around the navicular region. Has noticed some slight swelling but no definite warmth. No definite history of gout. Denies other arthralgias. Pain worse with ambulation. Not improved with Tylenol. Has not tried any anti-inflammatories. No recent change of shoe wear. Pain is achy quality and moderate severity.  Allergies: 1)  ! Percocet 2)  Keflex (Cephalexin) 3)  Erythromycin Ethylsuccinate (Erythromycin Ethylsuccinate) 4)  Macrodantin (Nitrofurantoin Macrocrystal)  Past History:  Past Medical History: Last updated: 08/14/2008 Breast cancer, hx of Osteoporosis shingles Diabetes mellitus, type II neuropathy heel spurs Diverticulosis Heartburn PMH reviewed for relevance  Review of Systems  The patient denies anorexia, fever, and weight loss.    Physical Exam  General:  Well-developed,well-nourished,in no acute distress; alert,appropriate and cooperative throughout examination Extremities:  patient has some mild swelling left foot around the navicular region. No definite warmth. Very light pink color to skin no erythema. Mild tenderness to palpation. Good range of motion ankle with no distal tibia tenderness   Impression & Recommendations:  Problem # 1:  FOOT PAIN (ICD-729.5)  ?etiology.  ?post tibial tendonitis.  Doubt gout.  Trial of Aleve twice daily along with icing and 4 inch Ace wrap provided. Consider x-rays and further evaluation in 2 weeks if no better. Patient does have type 2 diabetes and history of neuropathy but  no history of Charcot joint changes  Orders: Ace Wraps 3-5 in/yard  (O5366)  Complete Medication List: 1)  Atenolol 25 Mg Tabs (Atenolol) .... Once daily 2)  Lunesta 3 Mg Tabs (Eszopiclone) .... At bedtime 3)  Mirapex 0.5 Mg Tabs (Pramipexole dihydrochloride) .... One by mouth q hs 4)  Fluoxetine Hcl 20 Mg Caps (Fluoxetine hcl) .... Once daily 5)  Calcium 600/vitamin D 600-200 Mg-unit Tabs (Calcium carbonate-vitamin d) .[name].. 1200 total perday 6)  Fish Oil Oil (Fish oil) .... Once daily 7)  Ester-c 500-550 Mg Tabs (Bioflavonoid products) .[name].. 1 once daily 8)  Bayer Low Strength 81 Mg Tbec (Aspirin) .... Once daily 9)  Co Q-10 120 Mg Caps (Coenzyme q10) .... Once daily 10)  Freestyle Lite Strp (Glucose blood) .... Qd 11)  Lumigan 0.03 % Soln (Bimatoprost) .[name].. 1 drop both eyes at bedtime 12)  Amaryl 4 Mg Tabs (Glimepiride) .... One tab daily 13)  Cobal-1000 1000 Mcg/ml Inj Soln (Cyanocobalamin) .... 1.5 ml every 3 weeks 14)  Omeprazole 20 Mg Cpdr (Omeprazole) .[name].. 1 once daily 15)  Estring 2 Mg Ring (Estradiol) .... As directed 16)  Centrum Silver Tabs (Multiple vitamins-minerals) .... Once daily 17)  Grape Seed Extract 100 Mg Caps (Grape seed) .... Take 150 mg daily 18)  Aspirin 81 Mg Tbec (Aspirin) .... Take as directed 19)  Ecpirin 325 Mg Tbec (Aspirin) .... Take on monday only 20)  Bentyl 20 Mg Tabs (Dicyclomine hcl) .... One by mouth two times a day 21)  Tylenol  .... 2 at bedtime 22)  Sm Flax Seed Oil 1000 Mg Caps (Flaxseed (linseed)) .[name].. 1 once daily 23)  Vitamin D 500  .... 2 once daily 24)  Silver Sulfadiazine 1 % Crea (Silver sulfadiazine) .... Aplly to site bid 25)  Cipro 500 Mg Tabs (Ciprofloxacin hcl) .... One by mouth two times a day x 10 days 26)  Flagyl 500 Mg Tabs (Metronidazole) .... One by mouth two times a day x 10 days  no alcohol

## 2010-06-09 NOTE — Medication Information (Signed)
Summary: Alfonso Patten Approved  Lunesta Approved   Imported By: Maryln Gottron 12/15/2009 10:46:01  _____________________________________________________________________  External Attachment:    Type:   Image     Comment:   External Document

## 2010-06-09 NOTE — Letter (Signed)
Summary: [facility]  [facility]   Imported By: Maryln Gottron 10/29/2009 12:44:05  _____________________________________________________________________  External Attachment:    Type:   Image     Comment:   External Document

## 2010-06-09 NOTE — Letter (Signed)
Summary: [facility]  [facility]   Imported By: Maryln Gottron 07/03/2009 12:14:35  _____________________________________________________________________  External Attachment:    Type:   Image     Comment:   External Document

## 2010-06-09 NOTE — Assessment & Plan Note (Signed)
Summary: b12 inj/njr  Nurse Visit   Allergies: 1)  ! Percocet 2)  Keflex (Cephalexin) 3)  Erythromycin Ethylsuccinate (Erythromycin Ethylsuccinate) 4)  Macrodantin (Nitrofurantoin Macrocrystal)  Medication Administration  Injection # 1:    Medication: Vit B12 1000 mcg    Diagnosis: ANEMIA, B12 DEFICIENCY (ICD-281.1)    Route: IM    Site: R deltoid    Exp Date: 03/06/2010    Lot #: [id number]    Mfr: American Regent    Comments: 1.71ml/1500mcg given    Patient tolerated injection without complications    Given by: Willy Eddy, LPN (December 10, 2009 12:32 PM)  Orders Added: 1)  Vit B12 1000 mcg [J3420] 2)  Admin of Therapeutic Inj  intramuscular or subcutaneous [[id number]]

## 2010-06-09 NOTE — Progress Notes (Signed)
Summary: Pt checking on status of refill for Amaryl  Phone Note Call from Patient   Caller: Patient Summary of Call: Pt called and wanted to check on status of refill for Amaryl. Pt is leaving to go out of town today and needs this called in to [facility] on [address] before she leaves today.  Initial call taken by: Lucy Antigua,  Oct 07, 2009 9:29 AM    Prescriptions: AMARYL 4 MG  TABS (GLIMEPIRIDE) one tab daily  #30 Each x 5   Entered by:   Rudy Jew, RN   Authorized by:   Stacie Glaze MD   Signed by:   Rudy Jew, RN on 10/07/2009   Method used:   Electronically to        [facility]  [phone]* (retail)       [address]       [place], [place]  [address]       Ph: [phone] or [phone]       Fax: [phone]   RxID:   [id number]

## 2010-06-09 NOTE — Assessment & Plan Note (Signed)
Summary: b-12 inj with bonnye/cjr  Nurse Visit   Allergies: 1)  ! Percocet 2)  Keflex (Cephalexin) 3)  Erythromycin Ethylsuccinate (Erythromycin Ethylsuccinate) 4)  Macrodantin (Nitrofurantoin Macrocrystal)  Medication Administration  Injection # 1:    Medication: Vit B12 1000 mcg    Diagnosis: ANEMIA, B12 DEFICIENCY (ICD-281.1)    Route: IM    Site: R deltoid    Exp Date: 12/08/2011    Lot #: [id number]    Mfr: American Regent    Comments: 1.35ml.1500mcg given    Patient tolerated injection without complications    Given by: Willy Eddy, LPN (February 10, 2010 4:10 PM)  Orders Added: 1)  Vit B12 1000 mcg [J3420] 2)  Admin of Therapeutic Inj  intramuscular or subcutaneous [04540]

## 2010-06-09 NOTE — Assessment & Plan Note (Signed)
Summary: b12 inj/njr  Nurse Visit   Allergies: 1)  ! Percocet 2)  Keflex (Cephalexin) 3)  Erythromycin Ethylsuccinate (Erythromycin Ethylsuccinate) 4)  Macrodantin (Nitrofurantoin Macrocrystal)  Medication Administration  Injection # 1:    Medication: Vit B12 1000 mcg    Diagnosis: ANEMIA, B12 DEFICIENCY (ICD-281.1)    Route: IM    Site: L deltoid    Exp Date: 02/06/2011    Lot #: [id number]    Mfr: American Regent    Comments: 1.5/1500mcg given    Patient tolerated injection without complications    Given by: Willy Eddy, LPN (August 27, 2009 12:15 PM)  Orders Added: 1)  Vit B12 1000 mcg [J3420] 2)  Admin of Therapeutic Inj  intramuscular or subcutaneous [16109]

## 2010-06-09 NOTE — Assessment & Plan Note (Signed)
Summary: B-12 INJ/CJR  Nurse Visit   Allergies: 1)  ! Percocet 2)  Keflex (Cephalexin) 3)  Erythromycin Ethylsuccinate (Erythromycin Ethylsuccinate) 4)  Macrodantin (Nitrofurantoin Macrocrystal)

## 2010-06-09 NOTE — Assessment & Plan Note (Signed)
Summary: B-12 INJ/CJR  Nurse Visit   Allergies: 1)  ! Percocet 2)  Keflex (Cephalexin) 3)  Erythromycin Ethylsuccinate (Erythromycin Ethylsuccinate) 4)  Macrodantin (Nitrofurantoin Macrocrystal)  Medication Administration  Injection # 1:    Medication: Vit B12 1000 mcg    Diagnosis: ANEMIA, B12 DEFICIENCY (ICD-281.1)    Route: IM    Site: R deltoid    Exp Date: 12/08/2011    Lot #: [id number]    Mfr: American Regent    Comments: 1.23ml given    Given by: Willy Eddy, LPN (January 20, 2010 12:13 PM)  Orders Added: 1)  Vit B12 1000 mcg [J3420] 2)  Admin of Therapeutic Inj  intramuscular or subcutaneous [96372] 3)  Flu Vaccine 56yrs + MEDICARE PATIENTS [Q2039] 4)  Administration Flu vaccine - MCR [G0008]  Review of Systems       Flu Vaccine Consent Questions     Do you have a history of severe allergic reactions to this vaccine? no    Any prior history of allergic reactions to egg and/or gelatin? no    Do you have a sensitivity to the preservative Thimersol? no    Do you have a past history of Guillan-Barre Syndrome? no    Do you currently have an acute febrile illness? no    Have you ever had a severe reaction to latex? no    Vaccine information given and explained to patient? yes    Are you currently pregnant? no    Lot Number:[id number]   Exp Date:11/07/2010   Site Given  Left Deltoid IM

## 2010-06-09 NOTE — Medication Information (Signed)
Summary: Prior Authorization Request and Approval for Lunesta  Prior Authorization Request and Approval for Lunesta   Imported By: Maryln Gottron 07/30/2009 15:06:53  _____________________________________________________________________  External Attachment:    Type:   Image     Comment:   External Document

## 2010-06-09 NOTE — Assessment & Plan Note (Signed)
Summary: B-12 INJ/CJR  Nurse Visit   Allergies: 1)  ! Percocet 2)  Keflex (Cephalexin) 3)  Erythromycin Ethylsuccinate (Erythromycin Ethylsuccinate) 4)  Macrodantin (Nitrofurantoin Macrocrystal)  Appended Document: Orders Update    Clinical Lists Changes  Orders: Added new Service order of Vit B12 1000 mcg (Z6109) - Signed Added new Service order of Admin of Therapeutic Inj  intramuscular or subcutaneous (60454) - Signed       Medication Administration  Injection # 1:    Medication: Vit B12 1000 mcg    Diagnosis: ANEMIA, B12 DEFICIENCY (ICD-281.1)    Route: IM    Site: L deltoid    Exp Date: 03/10/2011    Lot #: [id number]    Mfr: American Regent    Patient tolerated injection without complications    Given by: Willy Eddy, LPN (January 20, 2010 7:54 AM)  Orders Added: 1)  Vit B12 1000 mcg [J3420] 2)  Admin of Therapeutic Inj  intramuscular or subcutaneous [09811]

## 2010-06-09 NOTE — Assessment & Plan Note (Signed)
Summary: b12 with bonnye//ccm  Nurse Visit   Allergies: 1)  ! Percocet 2)  Keflex (Cephalexin) 3)  Erythromycin Ethylsuccinate (Erythromycin Ethylsuccinate) 4)  Macrodantin (Nitrofurantoin Macrocrystal)  Appended Document: Orders Update    Clinical Lists Changes  Orders: Added new Service order of Vit B12 1000 mcg (Z6109) - Signed Added new Service order of Admin of Therapeutic Inj  intramuscular or subcutaneous (60454) - Signed       Medication Administration  Injection # 1:    Medication: Vit B12 1000 mcg    Diagnosis: ANEMIA, B12 DEFICIENCY (ICD-281.1)    Route: IM    Site: L deltoid    Exp Date: 01/08/2011    Lot #: [id number]    Mfr: American Regent    Comments: 1.66ml.1500mcg given    Patient tolerated injection without complications    Given by: Willy Eddy, LPN (June 12, 2009 12:09 PM)  Orders Added: 1)  Vit B12 1000 mcg [J3420] 2)  Admin of Therapeutic Inj  intramuscular or subcutaneous [19147]

## 2010-06-09 NOTE — Progress Notes (Signed)
Summary: Pt req med called in for Diverticulitis  Phone Note Call from Patient Call back at Home Phone [phone]   Caller: Patient Summary of Call: Pt says her diverticulitis is flaring up and she would like a med called in to [facility] on [address].  Initial call taken by: Lucy Antigua,  Sep 16, 2009 8:31 AM  Follow-up for Phone Call        please triage Follow-up by: Willy Eddy, LPN,  Sep 16, 2009 8:36 AM  Additional Follow-up for Phone Call Additional follow up Details #1::        Pt ate popcorn last night, and has epigasric pain with fever of 102 today.  Feels just like diverticulitis episodes. Additional Follow-up by: Lynann Beaver CMA,  Sep 16, 2009 8:41 AM    Additional Follow-up for Phone Call Additional follow up Details #2::    per dr Lovell Sheehan- may have flagyl 500 two times a day for 10 days and cipro 500 two times a day for 10 days Follow-up by: Willy Eddy, LPN,  Sep 16, 2009 10:30 AM  New/Updated Medications: CIPRO 500 MG TABS (CIPROFLOXACIN HCL) one by mouth two times a day x 10 days FLAGYL 500 MG TABS (METRONIDAZOLE) one by mouth two times a day x 10 days  NO ALCOHOL Prescriptions: FLAGYL 500 MG TABS (METRONIDAZOLE) one by mouth two times a day x 10 days  NO ALCOHOL  #20 x 0   Entered by:   Lynann Beaver CMA   Authorized by:   Stacie Glaze MD   Signed by:   Lynann Beaver CMA on 09/16/2009   Method used:   Electronically to        [facility]  [phone]* (retail)       [address]       [place], [place]  [address]       Ph: [phone] or [phone]       Fax: [phone]   RxID:   [id number] CIPRO 500 MG TABS (CIPROFLOXACIN HCL) one by mouth two times a day x 10 days  #20 x 0   Entered by:   Lynann Beaver CMA   Authorized by:   Stacie Glaze MD   Signed by:   Lynann Beaver CMA on 09/16/2009   Method used:   Electronically to        [facility]  [phone]* (retail)       [address]       [place], [place]  [address]       Ph: [phone] or [phone]       Fax: [phone]   RxID:   [id number]  Pt notified.

## 2010-06-09 NOTE — Assessment & Plan Note (Signed)
Summary: b-12 inj/with bonnye/cjr  Nurse Visit   Allergies: 1)  ! Percocet 2)  Keflex (Cephalexin) 3)  Erythromycin Ethylsuccinate (Erythromycin Ethylsuccinate) 4)  Macrodantin (Nitrofurantoin Macrocrystal)  Medication Administration  Injection # 1:    Medication: Vit B12 1000 mcg    Diagnosis: ANEMIA, B12 DEFICIENCY (ICD-281.1)    Route: IM    Site: R deltoid    Exp Date: 12/08/2011    Lot #: [id number]    Mfr: American Regent    Comments: 1.71ml.1500mcg ggiven    Patient tolerated injection without complications    Given by: Willy Eddy, LPN (March 03, 2010 9:09 AM)  Orders Added: 1)  Vit B12 1000 mcg [J3420] 2)  Admin of Therapeutic Inj  intramuscular or subcutaneous [16109]

## 2010-06-09 NOTE — Medication Information (Signed)
Summary: Coverage Approval for Lunesta  Coverage Approval for Lunesta   Imported By: Maryln Gottron 07/31/2009 12:45:51  _____________________________________________________________________  External Attachment:    Type:   Image     Comment:   External Document

## 2010-06-09 NOTE — Assessment & Plan Note (Signed)
Summary: 2 MTH ROV // RS   Vital Signs:  Patient profile:   70 year old female Height:      71 inches Weight:      235 pounds BMI:     32.89 Temp:     98.2 degrees F oral Pulse rate:   76 / minute Resp:     14 per minute BP sitting:   134 / 80  (left arm) Cuff size:   large  Vitals Entered By: Willy Eddy, LPN (June 18, 2009 10:42 AM)  Nutrition Counseling: Patient's BMI is greater than 25 and therefore counseled on weight management options. CC: ROA   Primary Care Provider:  Darryll Capers, MD  CC:  ROA.  History of Present Illness: the pt presents for follow up for DM and HTN she has not taken any cbgs since her last report was good  Diabetes Management History:      The patient is a 70 years old female who comes in for evaluation of DM Type 2.  She has not been enrolled in the "Diabetic Education Program".  She states understanding of dietary principles and is following her diet appropriately.  No sensory loss is reported.  Self foot exams are being performed.  She is checking home blood sugars.  She says that she is exercising.  Type of exercise includes: walking.  Duration of exercise is estimated to be 15 min.  She is doing this 1 time per week.        Hypoglycemic symptoms are not occurring.  No hyperglycemic symptoms are reported.    Preventive Screening-Counseling & Management  Alcohol-Tobacco     Smoking Status: never     Passive Smoke Exposure: no  Current Problems (verified): 1)  Fatty Liver Disease  (ICD-571.8) 2)  Abdominal Tenderness, Right Upper Quadrant  (ICD-789.61) 3)  Compression Fracture, Lumbar Vertebrae  (ICD-805.4) 4)  Ruq Pain  (ICD-789.01) 5)  Need Prophylactic Vaccination&inoculation Flu  (ICD-V04.81) 6)  Gerd  (ICD-530.81) 7)  Uns Advrs Eff Uns Rx Medicinal&biological Sbstnc  (ICD-995.20) 8)  Acute Bronchitis  (ICD-466.0) 9)  Gallstones  (ICD-574.20) 10)  Hyperlipidemia  (ICD-272.4) 11)  Abdominal Pain Right Upper Quadrant   (ICD-789.01) 12)  Anemia, B12 Deficiency  (ICD-281.1) 13)  Restless Leg Syndrome, Mild  (ICD-333.94) 14)  Neuropathy, Idiopathic Peripheral Nec  (ICD-356.8) 15)  Diabetes Mellitus, Type II  (ICD-250.00) 16)  Menopausal Syndrome  (ICD-627.2) 17)  Osteoporosis  (ICD-733.00) 18)  Breast Cancer, Hx of  (ICD-V10.3)  Allergies: 1)  ! Percocet 2)  Keflex (Cephalexin) 3)  Erythromycin Ethylsuccinate (Erythromycin Ethylsuccinate) 4)  Macrodantin (Nitrofurantoin Macrocrystal)  Past History:  Family History: Last updated: Aug 21, 2008 mother RA died from bleeding complications father  COPD,CHF, HTN and hyperlipidemia  Social History: Last updated: 03/07/2007 Never Smoked Alcohol use-no Drug use-no  Risk Factors: Exercise: yes (03/07/2007)  Risk Factors: Smoking Status: never (06/18/2009) Passive Smoke Exposure: no (06/18/2009)  Past medical, surgical, family and social histories (including risk factors) reviewed, and no changes noted (except as noted below).  Past Medical History: Reviewed history from August 21, 2008 and no changes required. Breast cancer, hx of Osteoporosis shingles Diabetes mellitus, type II neuropathy heel spurs Diverticulosis Heartburn  Past Surgical History: Reviewed history from Aug 21, 2008 and no changes required. [id number] TAH-2005 Colonoscopy-10/2005 Thyroidectomy Tonsillectomy Cholecystectomy 06/2008  Family History: Reviewed history from 2008-08-21 and no changes required. mother RA died from bleeding complications father  COPD,CHF, HTN and hyperlipidemia  Social History: Reviewed history from 03/07/2007 and no  changes required. Never Smoked Alcohol use-no Drug use-no  Review of Systems  The patient denies anorexia, fever, weight loss, weight gain, vision loss, decreased hearing, hoarseness, chest pain, syncope, dyspnea on exertion, peripheral edema, prolonged cough, headaches, hemoptysis, abdominal pain, melena, hematochezia,  severe indigestion/heartburn, hematuria, incontinence, genital sores, muscle weakness, suspicious skin lesions, transient blindness, difficulty walking, depression, unusual weight change, abnormal bleeding, enlarged lymph nodes, angioedema, and breast masses.    Physical Exam  General:  Well developed, well nourished, no acute distress. Head:  Normocephalic and atraumatic. Eyes:  pupils equal and pupils reactive to light.   Ears:  R ear normal and L ear normal.   Nose:  no external deformity and no nasal discharge.   Mouth:  good dentition and pharynx pink and moist.   Neck:  Supple; no masses. Lungs:  Normal respiratory effort, chest expands symmetrically. Lungs are clear to auscultation, no crackles or wheezes. Heart:  normal rate and regular rhythm.   Abdomen:  soft, non-tender, normal bowel sounds, and no distention.    Diabetes Management Exam:    Foot Exam (with socks and/or shoes not present):       Sensory-Pinprick/Light touch:          Left medial foot (L-4): diminished          Left dorsal foot (L-5): diminished          Left lateral foot (S-1): diminished          Right medial foot (L-4): diminished          Right dorsal foot (L-5): diminished          Right lateral foot (S-1): diminished   Impression & Recommendations:  Problem # 1:  DIABETES MELLITUS, TYPE II (ICD-250.00) reveiwed the aic and she is at goal Her updated medication list for this problem includes:    Bayer Low Strength 81 Mg Tbec (Aspirin) ..... Once daily    Amaryl 4 Mg Tabs (Glimepiride) ..... One tab daily    Aspirin 81 Mg Tbec (Aspirin) .[name]... Take as directed    Ecpirin 325 Mg Tbec (Aspirin) .[name]... Take on monday only  Labs Reviewed: Creat: 0.7 (08/07/2008)     Last Eye Exam: glaucoma (08/22/2008) Reviewed HgBA1c results: 6.2 (04/17/2009)  6.2 (12/09/2008)  Problem # 2:  HYPERLIPIDEMIA (ICD-272.4) set goal of 215 Labs Reviewed: SGOT: 22 (04/17/2009)   SGPT: 25 (04/17/2009)  Lipid  Goals: Chol Goal: 200 (08/21/2008)   HDL Goal: 40 (08/21/2008)   LDL Goal: 100 (08/21/2008)   TG Goal: 150 (08/21/2008)  Prior 10 Yr Risk Heart Disease: 13 % (03/18/2009)   HDL:41.50 (04/17/2009), 52.70 (08/07/2008)  LDL:99 (08/07/2008), 105 (63/05/6008)  Chol:166 (04/17/2009), 168 (08/07/2008)  Trig:94.0 (04/17/2009), 81.0 (08/07/2008)  Problem # 3:  FATTY LIVER DISEASE (ICD-571.8) set goal weight of 215  Complete Medication List: 1)  Atenolol 25 Mg Tabs (Atenolol) .... Once daily 2)  Lunesta 3 Mg Tabs (Eszopiclone) .... At bedtime 3)  Mirapex 0.5 Mg Tabs (Pramipexole dihydrochloride) .... One by mouth q hs 4)  Fluoxetine Hcl 20 Mg Caps (Fluoxetine hcl) .... Once daily 5)  Calcium 600/vitamin D 600-200 Mg-unit Tabs (Calcium carbonate-vitamin d) .[name].. 1200 total perday 6)  Fish Oil Oil (Fish oil) .... Once daily 7)  Ester-c 500-550 Mg Tabs (Bioflavonoid products) .[name].. 1 once daily 8)  Bayer Low Strength 81 Mg Tbec (Aspirin) .... Once daily 9)  Co Q-10 120 Mg Caps (Coenzyme q10) .... Once daily 10)  Freestyle Lite Strp (Glucose blood) .Marland KitchenMarland KitchenMarland Kitchen  Qd 11)  Lumigan 0.03 % Soln (Bimatoprost) .[name].. 1 drop both eyes at bedtime 12)  Amaryl 4 Mg Tabs (Glimepiride) .... One tab daily 13)  Cobal-1000 1000 Mcg/ml Inj Soln (Cyanocobalamin) .... 1.5 ml every 3 weeks 14)  Omeprazole 20 Mg Cpdr (Omeprazole) .[name].. 1 once daily 15)  Estring 2 Mg Ring (Estradiol) .... As directed 16)  Centrum Silver Tabs (Multiple vitamins-minerals) .... Once daily 17)  Grape Seed Extract 100 Mg Caps (Grape seed) .... Take 150 mg daily 18)  Aspirin 81 Mg Tbec (Aspirin) .... Take as directed 19)  Ecpirin 325 Mg Tbec (Aspirin) .... Take on monday only 20)  Bentyl 20 Mg Tabs (Dicyclomine hcl) .... One by mouth two times a day 21)  Tylenol  .... 2 at bedtime 22)  Sm Flax Seed Oil 1000 Mg Caps (Flaxseed (linseed)) .[name].. 1 once daily 23)  Vitamin D 500  .... 2 once daily 24)  Silver Sulfadiazine 1 % Crea (Silver sulfadiazine) ....  Aplly to site bid  Other Orders: Admin of Therapeutic Inj  intramuscular or subcutaneous ([address]75) Vit B12 1000 mcg (I4332)  Diabetes Management Assessment/Plan:      The following lipid goals have been established for the patient: Total cholesterol goal of 200; LDL cholesterol goal of 100; HDL cholesterol goal of 40; Triglyceride goal of 150.    Patient Instructions: 1)  Please schedule a follow-up appointment in 4 months. 2)  BMP prior to visit, ICD-9:401.9 3)  Hepatic Panel prior to visit, ICD-9:995.20 4)  Lipid Panel prior to visit, ICD-9:272.4 5)  TSH prior to visit, ICD-9:244.9 6)  HbgA1C prior to visit, ICD-9:250.00   Medication Administration  Injection # 1:    Medication: Vit B12 1000 mcg    Diagnosis: ANEMIA, B12 DEFICIENCY (ICD-281.1)    Route: IM    Site: L deltoid    Exp Date: 01/09/2011    Lot #: [id number]    Mfr: American Regent    Comments: 1.5ML/1500MCG GIVEN    Patient tolerated injection without complications    Given by: Willy Eddy, LPN (June 18, 2009 10:45 AM)  Orders Added: 1)  Admin of Therapeutic Inj  intramuscular or subcutaneous [96372] 2)  Vit B12 1000 mcg [J3420] 3)  Est. Patient Level IV [84166]

## 2010-06-09 NOTE — Assessment & Plan Note (Signed)
Summary: B12 INJ // RS  Nurse Visit   Allergies: 1)  ! Percocet 2)  Keflex (Cephalexin) 3)  Erythromycin Ethylsuccinate (Erythromycin Ethylsuccinate) 4)  Macrodantin (Nitrofurantoin Macrocrystal)  Medication Administration  Injection # 1:    Medication: Vit B12 1000 mcg    Diagnosis: ANEMIA, B12 DEFICIENCY (ICD-281.1)    Route: IM    Site: R deltoid    Exp Date: 02/06/2011    Lot #: [id number]    Mfr: American Regent    Patient tolerated injection without complications    Given by: Willy Eddy, LPN (July 17, 9602 12:18 PM)  Orders Added: 1)  Vit B12 1000 mcg [J3420] 2)  Admin of Therapeutic Inj  intramuscular or subcutaneous [96372]  Appended Document: Orders Update    Clinical Lists Changes  Problems: Added new problem of INSOMNIA, CHRONIC (ICD-307.42)

## 2010-06-09 NOTE — Assessment & Plan Note (Signed)
Summary: b-12 inj/cjr  Nurse Visit   Allergies: 1)  ! Percocet 2)  Keflex (Cephalexin) 3)  Erythromycin Ethylsuccinate (Erythromycin Ethylsuccinate) 4)  Macrodantin (Nitrofurantoin Macrocrystal)  Appended Document: Orders Update    Clinical Lists Changes  Orders: Added new Service order of Vit B12 1000 mcg (E4540) - Signed Added new Service order of Admin of Therapeutic Inj  intramuscular or subcutaneous (98119) - Signed       Medication Administration  Injection # 1:    Medication: Vit B12 1000 mcg    Diagnosis: ANEMIA, B12 DEFICIENCY (ICD-281.1)    Route: IM    Site: R deltoid    Exp Date: 12/08/2010    Lot #: [id number]    Mfr: American Regent    Comments: 1.62ml given    Patient tolerated injection without complications    Given by: Willy Eddy, LPN (March 27, 2010 9:14 AM)  Orders Added: 1)  Vit B12 1000 mcg [J3420] 2)  Admin of Therapeutic Inj  intramuscular or subcutaneous [[id number]]

## 2010-06-09 NOTE — Progress Notes (Signed)
Summary: loose stools and concentrated Urine  Phone Note Call from Patient   Caller: Patient Call For: Stacie Glaze MD Summary of Call: Pt is finishing 10 days of Cipro and Flagyl today, and diverticulitis symptoms are much better.  She is concerned about continued loose stools, and very concentrated Urine.  Leaving for beach Monday, and wants to know if she needs to be seen or do something differently. [phone] Initial call taken by: Lynann Beaver CMA,  Sep 25, 2009 10:01 AM  Follow-up for Phone Call        cipro can cause diarrhea- drink plenty of water and it will get better-watch diet and try to stay on liquids for a few days per dr Lovell Sheehan- pt informed Follow-up by: Willy Eddy, LPN,  Sep 25, 2009 12:17 PM

## 2010-06-09 NOTE — Assessment & Plan Note (Signed)
Summary: b12//ccm/pt rsc/cjr  Nurse Visit   Allergies: 1)  ! Percocet 2)  Keflex (Cephalexin) 3)  Erythromycin Ethylsuccinate (Erythromycin Ethylsuccinate) 4)  Macrodantin (Nitrofurantoin Macrocrystal)  Medication Administration  Injection # 1:    Medication: Vit B12 1000 mcg    Diagnosis: ANEMIA, B12 DEFICIENCY (ICD-281.1)    Route: IM    Site: L deltoid    Exp Date: 02/06/2011    Lot #: [id number]    Mfr: American Regent    Patient tolerated injection without complications    Given by: Willy Eddy, LPN (Sep 16, 2009 12:05 PM)  Orders Added: 1)  Vit B12 1000 mcg [J3420] 2)  Admin of Therapeutic Inj  intramuscular or subcutaneous [[id number]]

## 2010-06-09 NOTE — Assessment & Plan Note (Signed)
Summary: B12 INJ // RS  Nurse Visit   Allergies: 1)  ! Percocet 2)  Keflex (Cephalexin) 3)  Erythromycin Ethylsuccinate (Erythromycin Ethylsuccinate) 4)  Macrodantin (Nitrofurantoin Macrocrystal)  Medication Administration  Injection # 1:    Medication: Vit B12 1000 mcg    Diagnosis: ANEMIA, B12 DEFICIENCY (ICD-281.1)    Route: IM    Site: L deltoid    Exp Date: 02/05/2011    Lot #: [id number]    Mfr: American Regent    Comments: 1.18ml/1500mg     Patient tolerated injection without complications    Given by: Willy Eddy, LPN (October 28, 2009 12:21 PM)  Orders Added: 1)  Vit B12 1000 mcg [J3420] 2)  Admin of Therapeutic Inj  intramuscular or subcutaneous [[id number]]

## 2010-06-09 NOTE — Assessment & Plan Note (Signed)
Summary: B12 INJ // RS  Nurse Visit   Allergies: 1)  ! Percocet 2)  Keflex (Cephalexin) 3)  Erythromycin Ethylsuccinate (Erythromycin Ethylsuccinate) 4)  Macrodantin (Nitrofurantoin Macrocrystal)  Appended Document: Orders Update    Clinical Lists Changes  Orders: Added new Service order of Vit B12 1000 mcg (E4540) - Signed Added new Service order of Admin of Therapeutic Inj  intramuscular or subcutaneous (98119) - Signed       Medication Administration  Injection # 1:    Medication: Vit B12 1000 mcg    Diagnosis: ANEMIA, B12 DEFICIENCY (ICD-281.1)    Route: IM    Site: R deltoid    Exp Date: 03/10/2011    Lot #: [id number]    Mfr: American Regent    Comments: 1.33ml/1500mcg given    Patient tolerated injection without complications    Given by: Willy Eddy, LPN (August 12, 1476 8:15 AM)  Orders Added: 1)  Vit B12 1000 mcg [J3420] 2)  Admin of Therapeutic Inj  intramuscular or subcutaneous [[id number]]

## 2010-06-11 NOTE — Assessment & Plan Note (Signed)
Summary: b-12 inj/cjr  Nurse Visit   Allergies: 1)  ! Percocet 2)  Keflex (Cephalexin) 3)  Erythromycin Ethylsuccinate (Erythromycin Ethylsuccinate) 4)  Macrodantin (Nitrofurantoin Macrocrystal)  Medication Administration  Injection # 1:    Medication: Vit B12 1000 mcg    Diagnosis: ANEMIA, B12 DEFICIENCY (ICD-281.1)    Route: IM    Site: L deltoid    Exp Date: 12/08/2011    Lot #: [id number]    Mfr: American Regent    Comments: 1.95ml/1500mg  given    Patient tolerated injection without complications    Given by: Willy Eddy, LPN (May 19, 2010 12:13 PM)  Orders Added: 1)  Vit B12 1000 mcg [J3420] 2)  Admin of Therapeutic Inj  intramuscular or subcutaneous [04540]

## 2010-06-11 NOTE — Assessment & Plan Note (Signed)
Summary: b12 inj//ccm  Nurse Visit   Allergies: 1)  ! Percocet 2)  Keflex (Cephalexin) 3)  Erythromycin Ethylsuccinate (Erythromycin Ethylsuccinate) 4)  Macrodantin (Nitrofurantoin Macrocrystal)  Medication Administration  Injection # 1:    Medication: Vit B12 1000 mcg    Diagnosis: ANEMIA, B12 DEFICIENCY (ICD-281.1)    Route: IM    Site: R deltoid    Exp Date: 11/2011    Lot #: [id number]    Mfr: American Regent    Patient tolerated injection without complications    Given by: Brenton Grills CMA Duncan Dull) (April 28, 2010 12:29 PM)  Orders Added: 1)  Admin of Therapeutic Inj  intramuscular or subcutaneous [96372] 2)  Vit B12 1000 mcg [J3420]

## 2010-06-11 NOTE — Letter (Signed)
Summary: [facility]  [facility]   Imported By: Maryln Gottron 05/15/2010 14:25:38  _____________________________________________________________________  External Attachment:    Type:   Image     Comment:   External Document

## 2010-06-17 ENCOUNTER — Telehealth: Payer: Self-pay | Admitting: *Deleted

## 2010-06-17 DIAGNOSIS — I10 Essential (primary) hypertension: Secondary | ICD-10-CM

## 2010-06-17 DIAGNOSIS — E538 Deficiency of other specified B group vitamins: Secondary | ICD-10-CM

## 2010-06-17 DIAGNOSIS — E785 Hyperlipidemia, unspecified: Secondary | ICD-10-CM

## 2010-06-17 NOTE — Telephone Encounter (Signed)
We'll check a lipid panel a basic metabolic panel a CBC and a B12 level prior to her office visit

## 2010-06-17 NOTE — Telephone Encounter (Signed)
Please schedule with pt--thanks

## 2010-06-17 NOTE — Telephone Encounter (Signed)
Pt needs to know if she should schedule labs before her next appt in March.

## 2010-06-17 NOTE — Assessment & Plan Note (Signed)
Summary: b-12 inj/cjr  Nurse Visit   Allergies: 1)  ! Percocet 2)  Keflex (Cephalexin) 3)  Erythromycin Ethylsuccinate (Erythromycin Ethylsuccinate) 4)  Macrodantin (Nitrofurantoin Macrocrystal)  Medication Administration  Injection # 1:    Medication: Vit B12 1000 mcg    Diagnosis: ANEMIA, B12 DEFICIENCY (ICD-281.1)    Route: IM    Site: L deltoid    Exp Date: 01/07/2012    Lot #: [id number]    Mfr: American Regent    Patient tolerated injection without complications    Given by: Willy Eddy, LPN (June 09, 2010 12:23 PM)  Orders Added: 1)  Vit B12 1000 mcg [J3420] 2)  Admin of Therapeutic Inj  intramuscular or subcutaneous [[id number]]

## 2010-06-18 NOTE — Telephone Encounter (Signed)
Pt has been sch for labs on 07/10/10 10am as noted.

## 2010-06-18 NOTE — Telephone Encounter (Signed)
Lft message with pts spouse, for pt to call back to sch labs prior to ov in March. Waiting on call back.

## 2010-06-29 ENCOUNTER — Encounter: Payer: Self-pay | Admitting: Internal Medicine

## 2010-06-30 ENCOUNTER — Ambulatory Visit (INDEPENDENT_AMBULATORY_CARE_PROVIDER_SITE_OTHER): Payer: Medicare Other | Admitting: Internal Medicine

## 2010-06-30 DIAGNOSIS — D518 Other vitamin B12 deficiency anemias: Secondary | ICD-10-CM

## 2010-06-30 DIAGNOSIS — D519 Vitamin B12 deficiency anemia, unspecified: Secondary | ICD-10-CM

## 2010-06-30 MED ORDER — CYANOCOBALAMIN 1000 MCG/ML IJ SOLN
1000.0000 ug | Freq: Once | INTRAMUSCULAR | Status: AC
  Administered 2010-06-30: 1000 ug via INTRAMUSCULAR

## 2010-07-06 ENCOUNTER — Other Ambulatory Visit: Payer: Self-pay | Admitting: Internal Medicine

## 2010-07-07 ENCOUNTER — Other Ambulatory Visit: Payer: Self-pay | Admitting: *Deleted

## 2010-07-10 ENCOUNTER — Other Ambulatory Visit (INDEPENDENT_AMBULATORY_CARE_PROVIDER_SITE_OTHER): Payer: Medicare Other | Admitting: Internal Medicine

## 2010-07-10 DIAGNOSIS — D649 Anemia, unspecified: Secondary | ICD-10-CM

## 2010-07-10 DIAGNOSIS — E119 Type 2 diabetes mellitus without complications: Secondary | ICD-10-CM

## 2010-07-10 DIAGNOSIS — D519 Vitamin B12 deficiency anemia, unspecified: Secondary | ICD-10-CM

## 2010-07-10 DIAGNOSIS — E785 Hyperlipidemia, unspecified: Secondary | ICD-10-CM

## 2010-07-10 DIAGNOSIS — E538 Deficiency of other specified B group vitamins: Secondary | ICD-10-CM

## 2010-07-10 DIAGNOSIS — I10 Essential (primary) hypertension: Secondary | ICD-10-CM

## 2010-07-10 LAB — BASIC METABOLIC PANEL
CO2: 30 mEq/L (ref 19–32)
Chloride: 108 mEq/L (ref 96–112)
Creatinine, Ser: 0.8 mg/dL (ref 0.4–1.2)
Potassium: 4.9 mEq/L (ref 3.5–5.1)
Sodium: 144 mEq/L (ref 135–145)

## 2010-07-10 LAB — CBC WITH DIFFERENTIAL/PLATELET
Basophils Relative: 0.6 % (ref 0.0–3.0)
Eosinophils Absolute: 0.2 10*3/uL (ref 0.0–0.7)
Eosinophils Relative: 3.8 % (ref 0.0–5.0)
HCT: 37 % (ref 36.0–46.0)
Hemoglobin: 12.3 g/dL (ref 12.0–15.0)
Lymphs Abs: 1.6 10*3/uL (ref 0.7–4.0)
MCHC: 33.1 g/dL (ref 30.0–36.0)
MCV: 93.3 fl (ref 78.0–100.0)
Monocytes Absolute: 0.5 10*3/uL (ref 0.1–1.0)
Neutro Abs: 3.7 10*3/uL (ref 1.4–7.7)
RBC: 3.97 Mil/uL (ref 3.87–5.11)

## 2010-07-10 LAB — LIPID PANEL
Cholesterol: 164 mg/dL (ref 0–200)
LDL Cholesterol: 96 mg/dL (ref 0–99)
Total CHOL/HDL Ratio: 3

## 2010-07-20 ENCOUNTER — Encounter: Payer: Self-pay | Admitting: Internal Medicine

## 2010-07-20 ENCOUNTER — Ambulatory Visit: Payer: Medicare Other | Admitting: Internal Medicine

## 2010-07-20 DIAGNOSIS — E119 Type 2 diabetes mellitus without complications: Secondary | ICD-10-CM

## 2010-07-20 DIAGNOSIS — E785 Hyperlipidemia, unspecified: Secondary | ICD-10-CM

## 2010-07-20 DIAGNOSIS — G608 Other hereditary and idiopathic neuropathies: Secondary | ICD-10-CM

## 2010-07-20 DIAGNOSIS — G609 Hereditary and idiopathic neuropathy, unspecified: Secondary | ICD-10-CM

## 2010-07-20 DIAGNOSIS — K7689 Other specified diseases of liver: Secondary | ICD-10-CM

## 2010-07-20 DIAGNOSIS — M7750 Other enthesopathy of unspecified foot: Secondary | ICD-10-CM

## 2010-07-20 MED ORDER — SITAGLIPTIN PHOSPHATE 100 MG PO TABS: 100.0000 mg | ORAL_TABLET | Freq: Every day | ORAL | Status: DC

## 2010-07-20 NOTE — Assessment & Plan Note (Signed)
Liver functions are completely normal

## 2010-07-20 NOTE — Assessment & Plan Note (Signed)
The neuropathy posterior history of chemotherapy is stable not progressive but has not improved

## 2010-07-20 NOTE — Assessment & Plan Note (Signed)
She is not able to exercise and she has gained back weight both of these factors influencing hemoglobin A1c increasing to 6.8 and a fasting blood glucose being in the 140 range. The LAD and insulin sensitizers she cannot tolerate metformin therefore we will add Januvia 100 mg by mouth daily and will recheck

## 2010-07-22 LAB — GLUCOSE, CAPILLARY: Glucose-Capillary: 154 mg/dL — ABNORMAL HIGH (ref 70–99)

## 2010-07-23 LAB — GLUCOSE, CAPILLARY
Glucose-Capillary: 103 mg/dL — ABNORMAL HIGH (ref 70–99)
Glucose-Capillary: 108 mg/dL — ABNORMAL HIGH (ref 70–99)
Glucose-Capillary: 117 mg/dL — ABNORMAL HIGH (ref 70–99)
Glucose-Capillary: 124 mg/dL — ABNORMAL HIGH (ref 70–99)
Glucose-Capillary: 128 mg/dL — ABNORMAL HIGH (ref 70–99)
Glucose-Capillary: 130 mg/dL — ABNORMAL HIGH (ref 70–99)
Glucose-Capillary: 131 mg/dL — ABNORMAL HIGH (ref 70–99)
Glucose-Capillary: 132 mg/dL — ABNORMAL HIGH (ref 70–99)
Glucose-Capillary: 132 mg/dL — ABNORMAL HIGH (ref 70–99)
Glucose-Capillary: 136 mg/dL — ABNORMAL HIGH (ref 70–99)
Glucose-Capillary: 136 mg/dL — ABNORMAL HIGH (ref 70–99)
Glucose-Capillary: 137 mg/dL — ABNORMAL HIGH (ref 70–99)
Glucose-Capillary: 137 mg/dL — ABNORMAL HIGH (ref 70–99)
Glucose-Capillary: 138 mg/dL — ABNORMAL HIGH (ref 70–99)
Glucose-Capillary: 153 mg/dL — ABNORMAL HIGH (ref 70–99)
Glucose-Capillary: 166 mg/dL — ABNORMAL HIGH (ref 70–99)
Glucose-Capillary: 168 mg/dL — ABNORMAL HIGH (ref 70–99)
Glucose-Capillary: 170 mg/dL — ABNORMAL HIGH (ref 70–99)
Glucose-Capillary: 172 mg/dL — ABNORMAL HIGH (ref 70–99)
Glucose-Capillary: 172 mg/dL — ABNORMAL HIGH (ref 70–99)
Glucose-Capillary: 172 mg/dL — ABNORMAL HIGH (ref 70–99)
Glucose-Capillary: 179 mg/dL — ABNORMAL HIGH (ref 70–99)
Glucose-Capillary: 182 mg/dL — ABNORMAL HIGH (ref 70–99)
Glucose-Capillary: 187 mg/dL — ABNORMAL HIGH (ref 70–99)
Glucose-Capillary: 191 mg/dL — ABNORMAL HIGH (ref 70–99)
Glucose-Capillary: 196 mg/dL — ABNORMAL HIGH (ref 70–99)

## 2010-07-24 LAB — COMPREHENSIVE METABOLIC PANEL
AST: 19 U/L (ref 0–37)
Albumin: 3.6 g/dL (ref 3.5–5.2)
Alkaline Phosphatase: 76 U/L (ref 39–117)
Chloride: 105 mEq/L (ref 96–112)
GFR calc Af Amer: >60 mL/min (ref >60)
Potassium: 4.1 mEq/L (ref 3.5–5.1)
Sodium: 139 mEq/L (ref 135–145)
Total Bilirubin: 0.4 mg/dL (ref 0.3–1.2)
Total Protein: 6.5 g/dL (ref 6.0–8.3)

## 2010-07-24 LAB — DIFFERENTIAL
Basophils Absolute: 0 10*3/uL (ref 0.0–0.1)
Basophils Relative: 0 % (ref 0–1)
Eosinophils Relative: 3 % (ref 0–5)
Monocytes Absolute: 0.5 10*3/uL (ref 0.1–1.0)
Monocytes Relative: 7 % (ref 3–12)

## 2010-07-24 LAB — CBC
Platelets: 244 10*3/uL (ref 150–400)
RBC: 3.98 MIL/uL (ref 3.87–5.11)
RDW: 15.2 % (ref 11.5–15.5)
WBC: 7.3 10*3/uL (ref 4.0–10.5)

## 2010-07-24 LAB — HEMOGLOBIN A1C: Mean Plasma Glucose: 126 mg/dL — ABNORMAL HIGH (ref <117)

## 2010-08-03 ENCOUNTER — Other Ambulatory Visit: Payer: Self-pay | Admitting: Internal Medicine

## 2010-08-19 ENCOUNTER — Ambulatory Visit (INDEPENDENT_AMBULATORY_CARE_PROVIDER_SITE_OTHER): Payer: Medicare Other | Admitting: Internal Medicine

## 2010-08-19 DIAGNOSIS — E538 Deficiency of other specified B group vitamins: Secondary | ICD-10-CM

## 2010-08-19 LAB — GLUCOSE, CAPILLARY: Glucose-Capillary: 118 mg/dL — ABNORMAL HIGH (ref 70–99)

## 2010-08-19 MED ORDER — CYANOCOBALAMIN 1000 MCG/ML IJ SOLN
1000.0000 ug | Freq: Once | INTRAMUSCULAR | Status: AC
  Administered 2010-08-19: 1000 ug via INTRAMUSCULAR

## 2010-08-25 LAB — CBC
Hemoglobin: 12.3 g/dL (ref 12.0–15.0)
MCHC: 33.1 g/dL (ref 30.0–36.0)
MCV: 91.5 fL (ref 78.0–100.0)
RBC: 4.05 MIL/uL (ref 3.87–5.11)
WBC: 5 10*3/uL (ref 4.0–10.5)

## 2010-08-25 LAB — COMPREHENSIVE METABOLIC PANEL
ALT: 20 U/L (ref 0–35)
AST: 19 U/L (ref 0–37)
CO2: 25 mEq/L (ref 19–32)
Calcium: 9 mg/dL (ref 8.4–10.5)
Chloride: 108 mEq/L (ref 96–112)
GFR calc Af Amer: >60 mL/min (ref >60)
GFR calc non Af Amer: >60 mL/min (ref >60)
Glucose, Bld: 158 mg/dL — ABNORMAL HIGH (ref 70–99)
Sodium: 141 mEq/L (ref 135–145)
Total Bilirubin: 0.6 mg/dL (ref 0.3–1.2)

## 2010-08-25 LAB — URINALYSIS, ROUTINE W REFLEX MICROSCOPIC
Ketones, ur: NEGATIVE mg/dL
Leukocytes, UA: NEGATIVE
Nitrite: NEGATIVE
Protein, ur: 30 mg/dL — AB
Urobilinogen, UA: 0.2 mg/dL (ref 0.0–1.0)

## 2010-08-25 LAB — DIFFERENTIAL
Basophils Absolute: 0 10*3/uL (ref 0.0–0.1)
Basophils Relative: 1 % (ref 0–1)
Eosinophils Absolute: 0.2 10*3/uL (ref 0.0–0.7)
Eosinophils Relative: 3 % (ref 0–5)
Neutrophils Relative %: 67 % (ref 43–77)

## 2010-08-25 LAB — URINE MICROSCOPIC-ADD ON

## 2010-08-25 LAB — GLUCOSE, CAPILLARY: Glucose-Capillary: 138 mg/dL — ABNORMAL HIGH (ref 70–99)

## 2010-08-31 ENCOUNTER — Other Ambulatory Visit: Payer: Self-pay | Admitting: Internal Medicine

## 2010-09-11 ENCOUNTER — Other Ambulatory Visit: Payer: Medicare Other

## 2010-09-18 ENCOUNTER — Ambulatory Visit: Payer: Medicare Other | Admitting: Internal Medicine

## 2010-09-22 NOTE — Op Note (Signed)
[name], [name]              ACCOUNT NO.:  [id number]   MEDICAL RECORD NO.:  [id number]          PATIENT TYPE:  AMB   LOCATION:  DAY                          FACILITY:  [facility]   PHYSICIAN:  Currie Paris, M.D.DATE OF BIRTH:  Aug 17, 1940   DATE OF PROCEDURE:  07/02/2008  DATE OF DISCHARGE:                               OPERATIVE REPORT   Office medical record [phone].   PREOPERATIVE DIAGNOSIS:  Gallstones with recent biliary colic.   POSTOPERATIVE DIAGNOSIS:  Gallstones with recent biliary colic.   OPERATION:  Laparoscopic cholecystectomy with operative cholangiogram.   SURGEON:  Currie Paris, M.D.   ASSISTANT:  Alfonse Ras, M.D.   ANESTHESIA:  General.   CLINICAL HISTORY:  This is a 70 year old lady with biliary type symptoms  and 1 episode of a severe biliary colic that ended up with a cardiac  workup that was otherwise negative.  She elected to proceed to  cholecystectomy.   DESCRIPTION OF PROCEDURE:  The patient was seen in the holding area and  she had no further questions.  We confirmed cholecystectomy as the  planned procedure.   The patient was taken to the operating room and after satisfactory  general endotracheal anesthesia had been obtained, the abdomen was  prepped and draped.  The time-out was done.   I used 0.25% plain Marcaine for each incision.  The umbilical incision  was made, the fascia identified and opened, and the peritoneal cavity  entered under direct vision.  A pursestring was placed, the Hasson  introduced, and the abdomen insufflated to 15.   The patient was placed in reverse Trendelenburg and tilted to the left.  A 10/11 trocar was placed in the epigastrium and two 5's laterally.   The gallbladder had multiple adhesions of omentum, which were taken down  with the cautery and blunt dissection.  Once I got down to the ampulla,  I was able to grasp it and retract it laterally and open the peritoneum  and identify a nice  long segment of cystic duct and cystic artery.  I  thought I saw a stone in the cystic duct.   I put 1 clip on the duct at the junction with the gallbladder and  another on the artery.  The cystic duct was opened and a Cook catheter  introduced and operative cholangiography done.  On the first run, we got  filling of the cystic duct and good filling of the duodenum and common  bile duct, but no filling of the common hepatic duct.  The patient was  then placed in some Trendelenburg and a second set of films done and  this showed good filling of the common hepatic duct and up to the  bifurcation.  On the second run, there was a question of a small air  bubble or stone in the cystic duct, but there was nothing in the common  duct.   The cystic duct catheter was removed.  I carefully inspected the cystic  duct and saw no evidence of any distal stone.  I went ahead and put 3  clips on the  cystic duct and divided it.  The cystic artery was  dissected out a little bit further and 2 additional clips placed and it  was divided, leaving 2 clips on the stay side.   The gallbladder was removed from below to above using coagulation  current of the cautery.  It was placed in the bag.   I irrigated and made sure the bed of the gallbladder was dry.  The  gallbladder was then brought out the umbilical site.  This was occluded  while we did a final irrigation and checked for hemostasis.  The lateral  ports were then removed under direct vision.  The pursestring was used  to close the umbilical site.  The abdomen was deflated through the  epigastric port site.  Skin was closed with 4-0 Monocryl subcuticular  plus Dermabond.   The patient tolerated the procedure well.  There were no operative  complications.  All counts were correct.      Currie Paris, M.D.  Electronically Signed     CJS/MEDQ  D:  07/02/2008  T:  07/03/2008  Job:  [id number]   cc:   Stacie Glaze, MD  [address] [place]  [place] [address]

## 2010-09-22 NOTE — Consult Note (Signed)
NAMEJAIDENCE, [name]              ACCOUNT NO.:  [id number]   MEDICAL RECORD NO.:  [id number]          PATIENT TYPE:  INP   LOCATION:  4707                         FACILITY:  [facility]   PHYSICIAN:  Elmore Guise., M.D.DATE OF BIRTH:  [birth year]   DATE OF CONSULTATION:  12/08/2007  DATE OF DISCHARGE:                                 CONSULTATION   INDICATION FOR CONSULTATION:  Chest discomfort.   PRIMARY CARDIOLOGIST:  Primary cardiologist per patient preference is  Dr. Delfin Edis of [facility]   HISTORY OF PRESENT ILLNESS:  [name] is a very pleasant 70 year old  white female with past medical history of breast cancer, hypertension,  diabetes mellitus, dyslipidemia, osteoarthritis who presents after  waking with substernal chest discomfort.  The patient actually notes  normal state of health.  She is very active with yard work and her daily  activities with no significant limitations.  She does have off-and-on  knee  problems and has undergone injections of her knees, but she is  able to do her normal ADLs without restrictions.  Last night she did her  normal activities with no significant changes in her diet or eating  habits.  She went to bed.  She awoke at 3 a.m. with upper chest burning.  This continued off and on for approximately 30 minutes.  The patient got  up and around with no significant changes.  She denied any shortness of  breath or exertional worsening.  She did have mild nausea.  Her burning  pain in her upper chest subsided and she reported some left chest  pressure.  Still waxing and waning.  No exertional component.  She  presented to the emergency room.  There she was given aspirin and Nitrol  paste with total resolution.  She is now chest pain free.  When she  quantitates her pain, she says that worse at worst, it was 8 out of 10.  However, after she was given her aspirin and nitroglycerin, her pain  subsided.  Other than knee pain, she will also  report some difficulty  with peripheral neuropathy which she has been suffering from for  sometime.  Otherwise review of systems are negative.   CURRENT MEDICATIONS:  1. Atenolol 25 mg daily.  2. Lunesta p.r.n.  3. Mirapex 0.5 mg daily.  4. Fluoxetine 20 mg daily.  5. Fish old once daily.  6. Aspirin once daily.  7. Amaryl 4 mg daily.   ALLERGIES:  ERYTHROMYCIN, KEFLEX, MACRODANTIN.   FAMILY HISTORY:  Positive for peptic ulcer disease, rheumatoid  arthritis.   PAST SURGICAL HISTORY:  '  1. Mastectomy in 1994 and 1995.  2. Total abdominal hysterectomy in 2005.  3. Partial thyroidectomy.  4. She has also had a tonsillectomy   SOCIAL HISTORY:  She is married.  No tobacco or alcohol use.   PHYSICAL EXAMINATION:  VITAL SIGNS:  She is afebrile.  Blood pressure  138/50, heart rate 85 and regular, respirations are 18, sat 93% on room  air.  GENERAL:  She is a very pleasant white female, alert and oriented x4.  No  acute distress.  HEENT:  Appear normal.  NECK:  Supple.  No lymphadenopathy.  2+ carotids with soft bruit on the  left.  She has no JVD.  She has an old thyroidectomy scar noted.  LUNGS:  Clear.  HEART:  Regular with soft 2/6 holosystolic murmur noted.  ABDOMEN:  Obese, soft, nontender, nondistended. No rebound or guarding.  No right upper quadrant pain.  EXTREMITIES:  Warm with 2+ pulses and no edema.  NEUROLOGICAL:  Cranial nerves are intact and strength 5/5 and equal  bilaterally.   LABORATORY DATA:  Her blood work shows a white count of 7.3, hemoglobin  12.6, platelet count of 260,000.  Her D-dimer 0.25.  BNP is less than  30.  Her myoglobin is 56 and 85 with MBs of 1.2 and 1.8, troponin-I less  than 0.05 x3.  Her BUN and creatinine are 16 and 0.8 with potassium  level 3.9.  Her LFTs and lipase are normal.  ECG shows normal sinus  rhythm 85 per minute with no ST or T-wave changes.  Normal ECG noted.  Chest x-ray shows no acute cardiopulmonary disease.  Her last  stress  test was done November 2005 showing normal perfusion and EF of 81%.   IMPRESSION:  1. Chest pain with typical and atypical qualities.  2. Multiple cardiac risk factors (diabetes mellitus, dyslipidemia,      hypertension).   PLAN:  1. At this time I would continue aspirin, Nitro paste as well as her      atenolol.  She is currently chest pain free.  She can hold off on      full anticoagulation unless chest pain returns or her enzymes      become elevated.  She does need serial cardiac markers.  If her      enzymes are negative and no further symptoms, likely okay for      discharge in a.m. with outpatient evaluation.  However, if      continued symptoms or positive enzymes, will need inpatient workup.      Further measures per Dr. Deborah Hahn who will see her in the morning.      Please call if any further cardiac concerns arise this evening.      Elmore Guise., M.D.  Electronically Signed     TWK/MEDQ  D:  12/08/2007  T:  12/08/2007  Job:  [id number]   cc:   [name], M.D.

## 2010-09-22 NOTE — Discharge Summary (Signed)
NAMEGWENDOLYN, [name]              ACCOUNT NO.:  [id number]   MEDICAL RECORD NO.:  [id number]          PATIENT TYPE:  INP   LOCATION:  4707                         FACILITY:  [facility]   PHYSICIAN:  Valetta Mole. Swords, MD    DATE OF BIRTH:  1940/09/16   DATE OF ADMISSION:  12/08/2007  DATE OF DISCHARGE:  12/09/2007                               DISCHARGE SUMMARY   DISCHARGE DIAGNOSES:  1. Chest pain, secondary to gastroesophageal reflux disease and      esophageal spasm.  2. Osteoporosis.  3. B12 deficiency.  4. Hypertension.  5. History of breast cancer.  6. Glaucoma.  7. Peripheral neuropathy.  8. Restless leg syndrome.  9. Depression.  10.Hypertension.  11.Dyslipidemia.  12.Diabetes.   Chest x-ray without acute disease, but there is thought to be some  atelectasis, but needs followup.  This was written on the patient's  discharge sheet.   DISCHARGE MEDICATIONS:  New medication include omeprazole 20 mg p.o.  daily.  She also takes:  1. Vitamin D 1000 units daily.  2. Calcium 600 mg p.o. b.i.d.  3. Aspirin 81 mg p.o. daily.  4. Atenolol 25 mg p.o. daily.  5. Amaryl 4 mg p.o. daily.  6. Mirapex 0.5 mg p.o. daily.  7. Prozac 20 mg p.o. daily.  8. Lunesta 3 mg p.o. nightly.  9. Lumigan eye drops   CONDITION ON DISCHARGE:  Improved.   FOLLOWUP PLANS:  Dr. Ronnald Nian office will call the patient regarding  followup.  She has appointment in 4-6 weeks with Dr. Lovell Sheehan.  She  understands she needs chest x-ray at that time.   HOSPITAL COURSE:  The patient was admitted to the hospital service with  chest discomfort.  The patient was treated with nitroglycerin and proton  pump inhibitor.  Symptoms quickly resolved.  The patient was seen in  consultation with Cardiology.  D-dimer was performed, which was  unremarkable.  Cardiac enzymes remained negative.  The patient is  discharged home safely.  Greater than 30 minutes spent in discharge  planning and discussion with the patient and  family.      Olivia Rexene Edison Swords, MD  Electronically Signed     BHS/MEDQ  D:  12/09/2007  T:  12/09/2007  Job:  [id number]

## 2010-09-22 NOTE — Discharge Summary (Signed)
NAMEGAY, MONCIVAIS              ACCOUNT NO.:  [id number]   MEDICAL RECORD NO.:  [id number]          PATIENT TYPE:  INP   LOCATION:  4707                         FACILITY:  [facility]   PHYSICIAN:  Valetta Mole. Swords, MD    DATE OF BIRTH:  1941-02-26   DATE OF ADMISSION:  12/08/2007  DATE OF DISCHARGE:  12/09/2007                               DISCHARGE SUMMARY   DISCHARGE DIAGNOSES:  1. Chest discomfort, presumed gastroesophageal reflux disease with      esophageal spasm.  2. Diabetes.  3. Dyslipidemia.  4. Hypertension.  5. History of depression.  6. Glaucoma.  7. Peripheral neuropathy.  8. Restless leg syndrome.  9. B12 deficiency.  10.History of breast cancer.  11.Osteoporosis.   DISCHARGE MEDICATIONS:  She takes numerous over-the-counter vitamin  preparations.  She prefers to continue these.  I have asked her to  discuss these with Dr. Lovell Sheehan.  The following are indicated for her  current medical conditions.  She takes:  1. Vitamin D 1000 units daily.  2. Calcium 600 mg p.o. b.i.d.  3. Fish oil 1200 mg b.i.d.  4. Aspirin 81 mg p.o. daily.  5. Atenolol 25 mg p.o. daily.  6. Amaryl 4 mg p.o. daily.  7. Mirapex 0.5 mg p.o. at bedtime.  8. Prozac 20 mg p.o. daily.  9. Lunesta 3 mg p.o. at bedtime.  10.Lumigan eye drops.   New medications:  Omeprazole 20 mg p.o. daily.   Followup plans to be made by Dr. Deborah Chalk, presumed outpatient nuclear  stress test.   Followup plans with Dr. Lovell Sheehan as scheduled in the next 4-6 weeks.   CONDITION ON DISCHARGE:  Improved without chest pain.   HOSPITAL LABORATORIES:  Lipid profile with a cholesterol of 164 and LDL  100.  Cardiac panel negative.  D-dimer normal at 0.25.  Urinalysis  unremarkable.  BNP less than 30.  Lipase normal at 37.  CMET on  admission normal except for a glucose of 108.   Chest x-ray demonstrated no acute cardiopulmonary disease.  There is  stable scarring at the left lung base.  There is presumed right lower  lobe segmental atelectasis.  Recommend followup chest x-ray in 2-4  weeks.  This is written on the patient's discharge summary.  She  understands that this is her responsibility to have this done.   HOSPITAL COURSE:  The patient was admitted to the hospital service on  12/08/2007.  The patient was admitted with chest discomfort.  The  patient was treated with nitroglycerin and proton pump inhibitor.  At  the time of discharge, the patient was feeling well without any chest  discomfort.  The patient will be discharged on current medications.   Other medical problems seem stable.  The patient was seen in  consultation by Cardiology.  Dr. Daneil Dolin office to call the patient to  schedule outpatient followup.   Greater than 30 minutes in discharge planning and discussion with the  patient.      Bruce Rexene Edison Swords, MD  Electronically Signed     BHS/MEDQ  D:  12/09/2007  T:  12/09/2007  Job:  [id number] 

## 2010-09-25 NOTE — Procedures (Signed)
[name], [name]              ACCOUNT NO.:  [id number]   MEDICAL RECORD NO.:  [id number]          PATIENT TYPE:  OUT   LOCATION:  SLEEP CENTER                 FACILITY:  [facility]   PHYSICIAN:  Barbaraann Share, MD,FCCPDATE OF BIRTH:  30-Jan-1940   DATE OF STUDY:  08/24/2006                            NOCTURNAL POLYSOMNOGRAM   REFERRING PHYSICIAN:  Stacie Glaze, MD   INDICATION FOR STUDY:  Hypersomnia with sleep apnea.   EPWORTH SLEEPINESS SCORE:  8.   MEDICATIONS:   SLEEP ARCHITECTURE:  Patient had a total sleep time of 258 minutes with  very little REM and never achieved slow wave sleep.  Sleep onset latency  was very prolonged at 102 minutes, and REM onset was very prolonged at  262 minutes.  Sleep efficiency was decreased at 61%.   RESPIRATORY DATA:  The patient was found to have 18 hypopneas and 1  apnea, for an apnea-hypopnea index of 4 events per hour.  The events  were not positional but there was moderate to loud snoring noted  throughout.   OXYGEN DATA:  There was O2 desaturation transiently to 86% with the  patient's obstructive events.   CARDIAC DATA:  Rare PVCs were noted, but no clinically significant  cardiac arrhythmias.   MOVEMENT-PARASOMNIA:  The patient was found to have 355 leg jerks with  11 per hour resulting in arousal or awakening.   IMPRESSIONS-RECOMMENDATIONS:  1. Small numbers of obstructive events which do not meet the apnea-      hypopnea index criteria for the obstructive sleep apnea syndrome.      Patient did have moderate to loud snoring noted.  2. Rare premature ventricular contraction.  3. Very large numbers of leg jerks with significant sleep disruption.      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  Clinical correlation is suggested.      Barbaraann Share, MD,FCCP  Diplomate, American Board of Sleep  Medicine  Electronically  Signed     K[facility]/MEDQ  D:  09/06/2006 15:56:34  T:  09/06/2006 17:55:14  Job:  [id number]

## 2010-09-25 NOTE — Consult Note (Signed)
[name], [name]              ACCOUNT NO.:  [id number]   MEDICAL RECORD NO.:  [id number]          PATIENT TYPE:  OUT   LOCATION:  GYN                          FACILITY:  [facility]   PHYSICIAN:  Paola A. Duard Brady, MD    DATE OF BIRTH:  Jun 08, 1940   DATE OF CONSULTATION:  12/01/2004  DATE OF DISCHARGE:                                   CONSULTATION   PRIMARY PHYSICIAN:  Dr. Katherine Mantle, [address], [address],  [place], [place], [address].   The patient is seen today in consultation as a self-referral second opinion,  also at the request of Dr. Laurena Bering. [name] is a 70 year old gravida 0 who  has been menopausal since 1994. She was diagnosed with right-sided focal  intraductal carcinoma with microinvasion and underwent a right modified  radical mastectomy in April 1994. Her tumor was ER/PR negative was stage  IIa. Subsequently she underwent chemotherapy and became amenorrheic and  menopausal. In May 2005 she underwent a left-sided modified radical  mastectomy for an ER/PR positive intraductal carcinoma. She states since  that time she has been worried about her ovaries and over the last 10 years  has intermittently consider having an oophorectomy and has undergone a  yearly ultrasound for evaluation. Her most recent ultrasound was October 21, 2004. The uterus measured 6.5 x 2.9 cm with a 1.9 mm endometrial stripe. She  had fibroids measuring 7 x 4 mm, 6 x 7 mm, and 6 x 7 mm. The right adnexa  measured 9.6 mm, the left adnexa 12 x 6 mm. There was no cul-de-sac fluid  and it was felt that she had a normal evaluation. She otherwise has no  complaints.   REVIEW OF SYSTEMS:  She denies any chest pain, shortness of breath, nausea,  vomiting, fevers, chills, headaches, or visual changes. She denies any  change in her bowel or bladder habits. She no longer has any hot flashes.  The only medical therapy she is on is an E-string for vaginal symptoms.   PAST MEDICAL HISTORY:   Significant for hypertension, diabetes, bilateral  breast cancer.   MEDICATIONS:  1.  E-string 2 mg every 3 months.  2.  Fosamax 70 mg weekly.  3.  Calcium.  4.  Multivitamins.  5.  Ambien p.r.n.  6.  Prozac 10 mg daily.  7.  Atenolol 25 mg daily.   ALLERGIES:  1.  ERYTHROMYCIN which causes a rash, nausea, and vomiting.  2.  MACROBID which causes a headache.  3.  KEFLEX - she does not recall the rash.   PAST SURGICAL HISTORY:  1.  Tonsillectomy.  2.  In 1980 she underwent exploratory laparotomy, uterine suspension, and      cautery of endometriosis.  3.  In 1980 - diagnostic laparoscopy for endometriosis.  4.  In 1974 - partial thyroidectomy.  5.  November 1993 - fracture of metacarpal repair.  6.  July 1994 - right foot fracture.  7.  November 1997 - left foot fracture.  8.  In 1995 - bilateral subcutaneous mastectomies.   HEALTH MAINTENANCE:  Her last  Pap smear was June 2005 and it was negative.  She had a negative colonoscopy in June 2000.   FAMILY HISTORY:  Her mother died at a 63 with rheumatoid arthritis. She has  a maternal aunt with breast cancer who is alive and well at the age of 70.  Maternal grandmother and maternal grandfather both died of coronary disease.  Her father died of complications of COPD. She has a half aunt with  premenopausal breast cancer.   SOCIAL HISTORY:  She denies the use of tobacco or alcohol. She is married.   PHYSICAL EXAMINATION:  VITAL SIGNS:  Height 6 feet tall, weight 229 pounds,  blood pressure 152/82, pulse 80, respirations 18.  GENERAL:  Well-nourished, well-developed female in no acute distress.  NECK:  Supple. There is no lymphadenopathy, no thyromegaly.  LUNGS:  Clear to auscultation bilaterally.  CARDIOVASCULAR:  Regular rate and rhythm.  ABDOMEN:  She has a infraumbilical incision. Abdomen is soft, nontender,  nondistended. There are no palpable masses or hepatosplenomegaly. Groins are  negative for adenopathy.  PELVIC:   Bimanual examination:  The corpus of normal size, shape,  consistency. There are no adnexal masses.   ASSESSMENT:  A 70 year old with history of bilateral breast cancers who has  been followed by Dr. Darnelle Catalan for this. She states she has long considered  the possibility of undergoing a therapeutic prophylactic oophorectomy and  wishes to do so at this time. Pros and cons of surgery include injury to  surrounding organs, bleeding, infection, risks of general anesthesia were  discussed with the patient. The also possibility of needing to proceed with  an open procedure were discussed with the patient. She will consider these  issues and get back to [name]. We will try to schedule her for August 15. We  will be in contact with Dr. Felipe Drone office to see if they would like to  perform a concomitant procedure with [name]. She will call me and we will  determine disposition pending these. Alternatives of surgery  included close follow-up with what she has been doing. I do not feel that  she is at significant risk for a primary ovarian carcinoma. I discussed that  her risk is most likely approximately 5%. However, that, she states, is 5%  more than she is wiling to deal with or accept.       PAG/MEDQ  D:  12/02/2004  T:  12/02/2004  Job:  [id number]   cc:   Stacie Glaze, M.D. [facility]   Valentino Hue. Magrinat, M.D.  [address] [facility]  [place]  [place] [address]  Fax: [phone]   Andres Ege, M.D.  [address]  [place]  [place] [address]  Fax: [phone]   Telford Nab, R.N.  [phone]  [place], [place] [address]

## 2010-09-25 NOTE — Consult Note (Signed)
NAMEJAYDAN, CHRETIEN              ACCOUNT NO.:  [id number]   MEDICAL RECORD NO.:  [id number]          PATIENT TYPE:  OUT   LOCATION:  GYN                          FACILITY:  [facility]   PHYSICIAN:  Paola A. Duard Brady, MD    DATE OF BIRTH:  05-Dec-1940   DATE OF CONSULTATION:  01/19/2005  DATE OF DISCHARGE:                                   CONSULTATION   Ms. Butkus is a very pleasant 70 year old with a history of breast cancer.  She wished to proceed with prophylactic/therapeutic oophorectomy.  She  underwent a diagnostic laparoscopy, lysis of adhesions, and laparoscopic  right-sided salpingo-oophorectomy by myself and Dr. Roseanna Rainbow,  on December 22, 2004.  Findings included significant adhesive disease of the  rectosigmoid colon, mesentery to the left ovarian fossa, evidence of a right  uterine suspension of the right ligament.  The right ovary was normal in  appearance, though atrophic.  There was surgically absent right adnexa.  Final pathology was consistent with a benign ovary with surface adhesions  and a benign fallopian tube.  She comes in today for her postoperative  check.  She is overall doing fairly well, except over this weekend she  developed her first shingles outbreak involving her left eye.  She is  currently on Valtrex and using a steroid ointment but otherwise feels that  she recovered from the surgery well.   PHYSICAL EXAMINATION:  VITAL SIGNS:  Weight 223 pounds.  ABDOMEN:  She has well-healed laparoscopy skin incisions.  Abdomen is soft  and nontender.  Estring was replaced at the patient's request.   ASSESSMENT:  A 70 year old with history of breast cancer, status post  laparoscopic right-sided oophorectomy.   PLAN:  She is attempting to obtain her records from when she had her uterine  suspension and other abdominal surgeries to see if she has had her left  ovary removed in the past.  Discussed with her that we spent a significant  amount of time trying to  identify a left adnexa, and it did appear to be  surgically absent.  She will follow up with her  primary gynecologist.  She is interested in considering the possibility of a  compounded Tamoxifen cream rather than an Estring for her vaginal dryness.  She knows that we will be happy to see her in the future should the need  arise.  She will return to see [name] on p.r.n. basis.      Paola A. Duard Brady, MD  Electronically Signed     PAG/MEDQ  D:  01/19/2005  T:  01/19/2005  Job:  [id number]   cc:   Andres Ege, M.D.  [address]  [place]  [place] [address]  Fax: [phone]   Valentino Hue. Magrinat, M.D.  [address] [facility]  [place]  [place] [address]  Fax: [phone]   Telford Nab, R.N.  [phone] N. [address]  [place], [place] [address]   Roseanna Rainbow, M.D.

## 2010-09-25 NOTE — Op Note (Signed)
[name], [name]              ACCOUNT NO.:  [id number]   MEDICAL RECORD NO.:  [id number]          PATIENT TYPE:  AMB   LOCATION:  DAY                          FACILITY:  [facility]   PHYSICIAN:  Paola A. Duard Brady, MD    DATE OF BIRTH:  [birth year]   DATE OF PROCEDURE:  12/22/2004  DATE OF DISCHARGE:                                 OPERATIVE REPORT   PREOPERATIVE DIAGNOSES:  1.  History of breast cancer.  2.  Desire for bilateral salpingo-oophorectomy.   POSTOPERATIVE DIAGNOSES:  1.  History of breast cancer.  2.  Desire for bilateral salpingo-oophorectomy.   PROCEDURE:  1.  Diagnostic laparoscopy.  2.  Lysis of adhesions.  3.  Laparoscopic right salpingo-oophorectomy.   SURGEONS:  Paola A. Duard Brady, M.D.; Roseanna Rainbow, M.D.   ANESTHESIA:  General.   SPECIMENS:  Right tube and ovary.   ESTIMATED BLOOD LOSS:  Minimal.   COMPLICATIONS:  None.   FINDINGS:  Significant adhesive disease of the rectosigmoid colon mesentery  to the left ovarian fossa. Evidence of right uterine suspension of the round  ligament was noted. The right ovary was normal in appearance, though  atrophic. There was adhesive disease of the colon and small bowel to the  anterior abdominal wall.   DESCRIPTION OF PROCEDURE:  The patient was taken to the operating room,  placed in supine position. The arms were tucked at the sides with the  patient awake to ensure she was comfortable. Legs were then placed in the  dorsal lithotomy position, again with the patient asleep to ensure comfort,  and all appropriate precautions were taken. Shoulder blocks were then  placed. General anesthesia was then induced without difficulty. The abdomen  was prepped and draped in usual sterile fashion. The perineum was prepped in  usual sterile fashion. Foley catheter was inserted into the bladder under  sterile conditions. Speculum was placed into the vagina. Cervix was  visualized. It was nulliparous in appearance and  the Hulka was placed  without difficulty. The remainder of the prepping was completed. Time-out  was then performed. The patient had evidence of previous infraumbilical  incision. Then 2 cc of 1% lidocaine was injected into the prior  infraumbilical skin incision. A vertical skin incision was made with the  knife and carried down to the underlying fascia using Mayos. The fascia was  grasped with Kocher clamps, elevated, transected and entered with Mayo  scissors. The posterior sheath was then identified. It was grasped with  Allis clamps, tented and entered. Both the fascia and the posterior sheath  of the fascia were secured using 0 Vicryl on a UR6. The Hasson was then  placed. The abdomen was insufflated with CO2 gas. Never did the patient's  pressure exceed 15 mmHg. She was then placed in Trendelenburg position and  findings as above were noted. Two 5 mm bilateral lower quadrant ports were  placed under direct visualization after confirming appropriate positioning  and placement with lidocaine. The 5 mm ports were placed. The appropriate  suprapubic site was then also identified. She had a prior transverse  suprapubic incision.  Lidocaine was used to infiltrate the area. A 15 mm  incision was made with the knife and the bladeless 10/12 trocar was placed  under direct visualization in the peritoneal cavity.   Our attention was first drawn to the left lower quadrant. There was adhesive  disease of the rectosigmoid colon, epiploica and mesentery to the left  pelvic sidewall. These areas of adhesions were taken down sharply.  Investigation revealed an absent left fallopian tube and a surgically absent  left ovary. The fossa was investigated and there was no evidence of any left  adnexa.   Our attention was then drawn to the patient's right side. The  infundibulopelvic vessels were deviated medially. The round ligament was  grasped. We entered the peritoneal cavity. Retroperitoneal  dissection  revealed the ureter to be medial and inferior to the infundibulopelvic  vessels. A window was made in the peritoneum between the IP and the ureter.  It was extended superiorly and inferiorly. Bipolar cautery was used to  cauterize the IP as well as the utero-ovarian. The utero-ovarian was  transected first with monopolar cautery and sharp dissection.   We then again turned our attention to the IP which was cauterized again with  visualization of the underlying ureter. It was then transected with  monopolar cautery. EndoCatch bag was placed through the suprapubic port and  the specimen was delivered through the suprapubic port without difficulty.  All pedicles were noted to be hemostatic and they were visualized under low  flow. There was no evidence of any bleeding. The trocars were removed under  direct visualization. The fascia was closed and reinforced with 0 Vicryl in  the supra-infraumbilical port site. A figure-of-  eight was used to close the fascia of the suprapubic port. A 4-0 Vicryl was  used to close the skin incisions. Steri-Strips and Band-Aids were applied.  All instrument, needle and Ray-Tec counts were correct x2. The patient  tolerated the procedure well and was taken to the recovery room in stable  condition.      Paola A. Duard Brady, MD  Electronically Signed     PAG/MEDQ  D:  12/22/2004  T:  12/22/2004  Job:  [id number]   cc:   Andres Ege, M.D.  [address]  [place]  [place] [address]  Fax: [phone]   Valentino Hue. Magrinat, M.D.  [address] [facility]  [place]  [place] [address]  Fax: [phone]   Telford Nab, R.N.  [phone] [address]  [place], [place] [address]   Roseanna Rainbow, M.D.

## 2010-09-29 ENCOUNTER — Telehealth: Payer: Self-pay | Admitting: *Deleted

## 2010-09-29 NOTE — Telephone Encounter (Signed)
Would not recommend bentyl for gas  Would recommend pepcid complete BID

## 2010-09-29 NOTE — Telephone Encounter (Signed)
Pt. Is at [facility] and is having a bout of "gastritis".  She is taking Mobic, and does not want to stop it.  Dr. Juanda Chance gave her Bentyl one time and that helped?   What does Dr. Lovell Sheehan think?

## 2010-09-29 NOTE — Telephone Encounter (Signed)
Notified pt. 

## 2010-10-13 ENCOUNTER — Other Ambulatory Visit (INDEPENDENT_AMBULATORY_CARE_PROVIDER_SITE_OTHER): Payer: Medicare Other

## 2010-10-13 DIAGNOSIS — D519 Vitamin B12 deficiency anemia, unspecified: Secondary | ICD-10-CM

## 2010-10-13 DIAGNOSIS — D518 Other vitamin B12 deficiency anemias: Secondary | ICD-10-CM

## 2010-10-13 DIAGNOSIS — E119 Type 2 diabetes mellitus without complications: Secondary | ICD-10-CM

## 2010-10-13 MED ORDER — CYANOCOBALAMIN 1000 MCG/ML IJ SOLN
1500.0000 ug | Freq: Once | INTRAMUSCULAR | Status: AC
  Administered 2010-10-13: 1500 ug via INTRAMUSCULAR

## 2010-10-20 ENCOUNTER — Encounter: Payer: Self-pay | Admitting: Internal Medicine

## 2010-10-20 ENCOUNTER — Ambulatory Visit (INDEPENDENT_AMBULATORY_CARE_PROVIDER_SITE_OTHER): Payer: Medicare Other | Admitting: Internal Medicine

## 2010-10-20 VITALS — BP 140/82 | HR 76 | Temp 98.2°F | Resp 16 | Ht 72.0 in | Wt 242.0 lb

## 2010-10-20 DIAGNOSIS — E785 Hyperlipidemia, unspecified: Secondary | ICD-10-CM

## 2010-10-20 DIAGNOSIS — K7689 Other specified diseases of liver: Secondary | ICD-10-CM

## 2010-10-20 DIAGNOSIS — D518 Other vitamin B12 deficiency anemias: Secondary | ICD-10-CM

## 2010-10-20 DIAGNOSIS — K219 Gastro-esophageal reflux disease without esophagitis: Secondary | ICD-10-CM

## 2010-10-20 DIAGNOSIS — E119 Type 2 diabetes mellitus without complications: Secondary | ICD-10-CM

## 2010-10-20 MED ORDER — DICYCLOMINE HCL 20 MG PO TABS: 20.0000 mg | ORAL_TABLET | Freq: Two times a day (BID) | ORAL | Status: DC

## 2010-10-20 NOTE — Progress Notes (Signed)
  Subjective:    Patient ID: [name], female    DOB: 08-11-1940, 70 y.o.   MRN: [id number]  HPI Increased GERD Hx of endoscopy proven gastritis pepcid helped but resumed after stopping Blood sugars are better She stopped the janumet    Review of Systems     Objective:   Physical Exam        Assessment & Plan:

## 2010-10-27 ENCOUNTER — Ambulatory Visit: Payer: Medicare Other | Admitting: Internal Medicine

## 2010-11-02 ENCOUNTER — Other Ambulatory Visit: Payer: Self-pay | Admitting: Internal Medicine

## 2010-11-03 ENCOUNTER — Ambulatory Visit: Payer: Medicare Other | Admitting: Internal Medicine

## 2010-11-03 DIAGNOSIS — E538 Deficiency of other specified B group vitamins: Secondary | ICD-10-CM

## 2010-11-03 DIAGNOSIS — D519 Vitamin B12 deficiency anemia, unspecified: Secondary | ICD-10-CM

## 2010-11-03 LAB — HM PAP SMEAR: HM Pap smear: NORMAL

## 2010-11-03 MED ORDER — CYANOCOBALAMIN 1000 MCG/ML IJ SOLN: 1000.0000 ug | Freq: Once | INTRAMUSCULAR | Status: DC

## 2010-11-24 ENCOUNTER — Ambulatory Visit (INDEPENDENT_AMBULATORY_CARE_PROVIDER_SITE_OTHER): Payer: Medicare Other | Admitting: Internal Medicine

## 2010-11-24 DIAGNOSIS — D518 Other vitamin B12 deficiency anemias: Secondary | ICD-10-CM

## 2010-11-24 DIAGNOSIS — D519 Vitamin B12 deficiency anemia, unspecified: Secondary | ICD-10-CM

## 2010-11-24 MED ORDER — CYANOCOBALAMIN 1000 MCG/ML IJ SOLN
1000.0000 ug | Freq: Once | INTRAMUSCULAR | Status: AC
  Administered 2010-11-24: 1000 ug via INTRAMUSCULAR

## 2010-12-07 ENCOUNTER — Other Ambulatory Visit: Payer: Self-pay | Admitting: Internal Medicine

## 2010-12-07 MED ORDER — MELOXICAM 15 MG PO TABS: 15.0000 mg | ORAL_TABLET | Freq: Every day | ORAL | Status: DC

## 2010-12-15 ENCOUNTER — Ambulatory Visit (INDEPENDENT_AMBULATORY_CARE_PROVIDER_SITE_OTHER): Payer: Medicare Other | Admitting: Internal Medicine

## 2010-12-15 DIAGNOSIS — D518 Other vitamin B12 deficiency anemias: Secondary | ICD-10-CM

## 2010-12-15 DIAGNOSIS — D519 Vitamin B12 deficiency anemia, unspecified: Secondary | ICD-10-CM

## 2010-12-15 MED ORDER — CYANOCOBALAMIN 1000 MCG/ML IJ SOLN
1000.0000 ug | INTRAMUSCULAR | Status: DC
  Administered 2010-12-15: 1000 ug via INTRAMUSCULAR

## 2010-12-21 ENCOUNTER — Other Ambulatory Visit (INDEPENDENT_AMBULATORY_CARE_PROVIDER_SITE_OTHER): Payer: Medicare Other

## 2010-12-21 DIAGNOSIS — Z131 Encounter for screening for diabetes mellitus: Secondary | ICD-10-CM

## 2010-12-24 ENCOUNTER — Ambulatory Visit (HOSPITAL_COMMUNITY)
Admission: RE | Admit: 2010-12-24 | Discharge: 2010-12-24 | Disposition: A | Discharge status: Home / Self Care | Payer: Medicare Other | Source: Ambulatory Visit | Attending: Internal Medicine | Admitting: Internal Medicine

## 2010-12-24 ENCOUNTER — Other Ambulatory Visit (HOSPITAL_BASED_OUTPATIENT_CLINIC_OR_DEPARTMENT_OTHER): Payer: Self-pay | Admitting: Internal Medicine

## 2010-12-24 ENCOUNTER — Encounter (HOSPITAL_BASED_OUTPATIENT_CLINIC_OR_DEPARTMENT_OTHER): Payer: Medicare Other | Attending: Internal Medicine

## 2010-12-24 DIAGNOSIS — T148XXA Other injury of unspecified body region, initial encounter: Secondary | ICD-10-CM

## 2010-12-24 DIAGNOSIS — G5790 Unspecified mononeuropathy of unspecified lower limb: Secondary | ICD-10-CM | POA: Insufficient documentation

## 2010-12-24 DIAGNOSIS — L97509 Non-pressure chronic ulcer of other part of unspecified foot with unspecified severity: Secondary | ICD-10-CM | POA: Insufficient documentation

## 2010-12-24 DIAGNOSIS — X58XXXA Exposure to other specified factors, initial encounter: Secondary | ICD-10-CM | POA: Insufficient documentation

## 2010-12-24 DIAGNOSIS — Z79899 Other long term (current) drug therapy: Secondary | ICD-10-CM | POA: Insufficient documentation

## 2010-12-24 DIAGNOSIS — S91109A Unspecified open wound of unspecified toe(s) without damage to nail, initial encounter: Secondary | ICD-10-CM | POA: Insufficient documentation

## 2010-12-24 DIAGNOSIS — L84 Corns and callosities: Secondary | ICD-10-CM | POA: Insufficient documentation

## 2010-12-24 DIAGNOSIS — Z853 Personal history of malignant neoplasm of breast: Secondary | ICD-10-CM | POA: Insufficient documentation

## 2010-12-24 DIAGNOSIS — E1169 Type 2 diabetes mellitus with other specified complication: Secondary | ICD-10-CM | POA: Insufficient documentation

## 2010-12-24 DIAGNOSIS — Z9221 Personal history of antineoplastic chemotherapy: Secondary | ICD-10-CM | POA: Insufficient documentation

## 2010-12-24 DIAGNOSIS — Z9010 Acquired absence of unspecified breast and nipple: Secondary | ICD-10-CM | POA: Insufficient documentation

## 2010-12-26 ENCOUNTER — Other Ambulatory Visit: Payer: Self-pay | Admitting: Internal Medicine

## 2010-12-28 ENCOUNTER — Telehealth: Payer: Self-pay | Admitting: *Deleted

## 2010-12-28 NOTE — Telephone Encounter (Signed)
Pharmacy needs new prescription with diagnosis code on it, please.

## 2010-12-29 ENCOUNTER — Telehealth: Payer: Self-pay | Admitting: *Deleted

## 2010-12-29 NOTE — Telephone Encounter (Signed)
done

## 2010-12-29 NOTE — Telephone Encounter (Signed)
completed

## 2010-12-31 ENCOUNTER — Other Ambulatory Visit (HOSPITAL_BASED_OUTPATIENT_CLINIC_OR_DEPARTMENT_OTHER): Payer: Self-pay | Admitting: Internal Medicine

## 2011-01-04 ENCOUNTER — Ambulatory Visit (INDEPENDENT_AMBULATORY_CARE_PROVIDER_SITE_OTHER): Payer: Medicare Other | Admitting: Internal Medicine

## 2011-01-04 ENCOUNTER — Other Ambulatory Visit: Payer: Self-pay | Admitting: Internal Medicine

## 2011-01-04 VITALS — BP 150/70 | HR 76 | Temp 98.2°F | Resp 16 | Ht 72.0 in | Wt 245.0 lb

## 2011-01-04 DIAGNOSIS — E119 Type 2 diabetes mellitus without complications: Secondary | ICD-10-CM

## 2011-01-04 DIAGNOSIS — E1165 Type 2 diabetes mellitus with hyperglycemia: Secondary | ICD-10-CM

## 2011-01-04 DIAGNOSIS — E1169 Type 2 diabetes mellitus with other specified complication: Secondary | ICD-10-CM

## 2011-01-04 DIAGNOSIS — I10 Essential (primary) hypertension: Secondary | ICD-10-CM

## 2011-01-04 NOTE — Progress Notes (Signed)
Subjective:    Patient ID: [name], female    DOB: [birth year], 70 y.o.   MRN: [id number]  HPI Follow up of DM and weight issues Blood pressure is stable reviewed vit d levels Patient has not been able to lose weight she states she has not been exercising due to foot pain we discussed alternative exercises that may not exacerbate her foot pain.  The patient is a moderately elevated blood pressure today on recheck it is still elevated this is secondary to weight gain and lack of exercise discussed salt limitation in diet changes   Review of Systems  Constitutional: Negative for activity change, appetite change and fatigue.  HENT: Negative for ear pain, congestion, neck pain, postnasal drip and sinus pressure.   Eyes: Negative for redness and visual disturbance.  Respiratory: Negative for cough, shortness of breath and wheezing.   Gastrointestinal: Negative for abdominal pain and abdominal distention.  Genitourinary: Negative for dysuria, frequency and menstrual problem.  Musculoskeletal: Negative for myalgias, joint swelling and arthralgias.  Skin: Negative for rash and wound.  Neurological: Negative for dizziness, weakness and headaches.  Hematological: Negative for adenopathy. Does not bruise/bleed easily.  Psychiatric/Behavioral: Negative for sleep disturbance and decreased concentration.   Past Medical History  Diagnosis Date  . Hx of breast cancer   . Osteoporosis   . Shingles   . Diabetes mellitus   . Neuropathy   . AC (acromioclavicular) joint bone spurs   . Diverticulitis   . Heartburn   . Hypertension    Past Surgical History  Procedure Date  . Cholecystectomy 06/2008  . Tonsillectomy   . Thyroidectomy   . Total abdominal hysterectomy 2005  . Mastectomy 1994, 1995    reports that she has never smoked. She does not have any smokeless tobacco history on file. She reports that she does not drink alcohol or use illicit drugs. family history includes Arthritis  in her mother; COPD in her father; Heart disease in her father; Hyperlipidemia in her father; and Hypertension in her father. Allergies  Allergen Reactions  . Cephalexin     REACTION: unspecified  . Erythromycin Ethylsuccinate     REACTION: unspecified  . Nitrofurantoin     REACTION: unspecified  . Oxycodone-Acetaminophen        Objective:   Physical Exam  Nursing note and vitals reviewed. Constitutional: She is oriented to person, place, and time. She appears well-developed and well-nourished. No distress.  HENT:  Head: Normocephalic and atraumatic.  Right Ear: External ear normal.  Left Ear: External ear normal.  Nose: Nose normal.  Mouth/Throat: Oropharynx is clear and moist.  Eyes: Conjunctivae and EOM are normal. Pupils are equal, round, and reactive to light.  Neck: Normal range of motion. Neck supple. No JVD present. No tracheal deviation present. No thyromegaly present.  Cardiovascular: Normal rate, regular rhythm, normal heart sounds and intact distal pulses.   No murmur heard. Pulmonary/Chest: Effort normal and breath sounds normal. She has no wheezes. She exhibits no tenderness.  Abdominal: Soft. Bowel sounds are normal.  Musculoskeletal: Normal range of motion. She exhibits no edema and no tenderness.  Lymphadenopathy:    She has no cervical adenopathy.  Neurological: She is alert and oriented to person, place, and time. She has normal reflexes. No cranial nerve deficit.  Skin: Skin is warm and dry. She is not diaphoretic.  Psychiatric: She has a normal mood and affect. Her behavior is normal.          Assessment & Plan:  Follow up DM with hemoglobin A1c and basic metabolic panel monitoring.  Hypertension is moderately elevated today on her current medications weight gain salt use with moderate volume overload play a role discussed salt limitation and and exercise as keys to control and blood pressure without adding additional medications she will monitor her  blood pressure remains elevated she will call the office back for an increase her blood pressure medications however we are hopeful with dietary salt limitation as well as weight loss and exercise her blood pressure will regain control.

## 2011-01-07 ENCOUNTER — Encounter: Payer: Self-pay | Admitting: Internal Medicine

## 2011-01-14 ENCOUNTER — Encounter (HOSPITAL_BASED_OUTPATIENT_CLINIC_OR_DEPARTMENT_OTHER): Payer: Medicare Other | Attending: Internal Medicine

## 2011-01-14 DIAGNOSIS — L97509 Non-pressure chronic ulcer of other part of unspecified foot with unspecified severity: Secondary | ICD-10-CM | POA: Insufficient documentation

## 2011-01-14 DIAGNOSIS — L84 Corns and callosities: Secondary | ICD-10-CM | POA: Insufficient documentation

## 2011-01-14 DIAGNOSIS — Z853 Personal history of malignant neoplasm of breast: Secondary | ICD-10-CM | POA: Insufficient documentation

## 2011-01-14 DIAGNOSIS — Z79899 Other long term (current) drug therapy: Secondary | ICD-10-CM | POA: Insufficient documentation

## 2011-01-14 DIAGNOSIS — Z9010 Acquired absence of unspecified breast and nipple: Secondary | ICD-10-CM | POA: Insufficient documentation

## 2011-01-14 DIAGNOSIS — Z9221 Personal history of antineoplastic chemotherapy: Secondary | ICD-10-CM | POA: Insufficient documentation

## 2011-01-14 DIAGNOSIS — G5790 Unspecified mononeuropathy of unspecified lower limb: Secondary | ICD-10-CM | POA: Insufficient documentation

## 2011-01-14 DIAGNOSIS — E1169 Type 2 diabetes mellitus with other specified complication: Secondary | ICD-10-CM | POA: Insufficient documentation

## 2011-01-25 ENCOUNTER — Ambulatory Visit: Payer: Medicare Other | Admitting: *Deleted

## 2011-02-05 LAB — LIPID PANEL
Cholesterol: 164
HDL: 42
LDL Cholesterol: 100 — ABNORMAL HIGH
Triglycerides: 111

## 2011-02-05 LAB — CARDIAC PANEL(CRET KIN+CKTOT+MB+TROPI)
CK, MB: 1
Total CK: 48
Troponin I: <0.01

## 2011-02-05 LAB — URINALYSIS, ROUTINE W REFLEX MICROSCOPIC
Glucose, UA: NEGATIVE
Hgb urine dipstick: NEGATIVE
Ketones, ur: NEGATIVE
Protein, ur: NEGATIVE
Urobilinogen, UA: 0.2

## 2011-02-05 LAB — D-DIMER, QUANTITATIVE: D-Dimer, Quant: 0.25

## 2011-02-05 LAB — COMPREHENSIVE METABOLIC PANEL
ALT: 23
Albumin: 3.7
Alkaline Phosphatase: 67
BUN: 16
Potassium: 3.9
Sodium: 139
Total Protein: 6.6

## 2011-02-05 LAB — DIFFERENTIAL
Basophils Relative: 1
Monocytes Absolute: 0.6
Monocytes Relative: 9
Neutro Abs: 4.4

## 2011-02-05 LAB — TROPONIN I: Troponin I: 0.01

## 2011-02-05 LAB — POCT CARDIAC MARKERS
CKMB, poc: 1.2
Myoglobin, poc: 56.8

## 2011-02-05 LAB — CK TOTAL AND CKMB (NOT AT ARMC)
Relative Index: INVALID
Total CK: 61

## 2011-02-05 LAB — CBC
Platelets: 260
RDW: 15

## 2011-02-05 LAB — B-NATRIURETIC PEPTIDE (CONVERTED LAB): Pro B Natriuretic peptide (BNP): <30

## 2011-02-11 ENCOUNTER — Encounter (HOSPITAL_BASED_OUTPATIENT_CLINIC_OR_DEPARTMENT_OTHER): Payer: Medicare Other | Attending: Internal Medicine

## 2011-02-11 DIAGNOSIS — I10 Essential (primary) hypertension: Secondary | ICD-10-CM | POA: Insufficient documentation

## 2011-02-11 DIAGNOSIS — L03119 Cellulitis of unspecified part of limb: Secondary | ICD-10-CM | POA: Insufficient documentation

## 2011-02-11 DIAGNOSIS — Z79899 Other long term (current) drug therapy: Secondary | ICD-10-CM | POA: Insufficient documentation

## 2011-02-11 DIAGNOSIS — Z853 Personal history of malignant neoplasm of breast: Secondary | ICD-10-CM | POA: Insufficient documentation

## 2011-02-11 DIAGNOSIS — E1169 Type 2 diabetes mellitus with other specified complication: Secondary | ICD-10-CM | POA: Insufficient documentation

## 2011-02-11 DIAGNOSIS — L02619 Cutaneous abscess of unspecified foot: Secondary | ICD-10-CM | POA: Insufficient documentation

## 2011-02-11 DIAGNOSIS — L97509 Non-pressure chronic ulcer of other part of unspecified foot with unspecified severity: Secondary | ICD-10-CM | POA: Insufficient documentation

## 2011-02-15 ENCOUNTER — Ambulatory Visit (INDEPENDENT_AMBULATORY_CARE_PROVIDER_SITE_OTHER): Payer: Medicare Other | Admitting: Internal Medicine

## 2011-02-15 DIAGNOSIS — D518 Other vitamin B12 deficiency anemias: Secondary | ICD-10-CM

## 2011-02-15 DIAGNOSIS — D519 Vitamin B12 deficiency anemia, unspecified: Secondary | ICD-10-CM

## 2011-02-15 MED ORDER — CYANOCOBALAMIN 1000 MCG/ML IJ SOLN
1000.0000 ug | INTRAMUSCULAR | Status: DC
  Administered 2011-02-15: 1500 ug via INTRAMUSCULAR

## 2011-03-05 ENCOUNTER — Encounter: Payer: Self-pay | Admitting: Internal Medicine

## 2011-03-05 ENCOUNTER — Ambulatory Visit (INDEPENDENT_AMBULATORY_CARE_PROVIDER_SITE_OTHER): Payer: Medicare Other | Admitting: Internal Medicine

## 2011-03-05 VITALS — BP 122/72 | HR 76 | Temp 98.2°F | Resp 16 | Ht 72.0 in | Wt 245.0 lb

## 2011-03-05 DIAGNOSIS — M1990 Unspecified osteoarthritis, unspecified site: Secondary | ICD-10-CM

## 2011-03-05 DIAGNOSIS — D518 Other vitamin B12 deficiency anemias: Secondary | ICD-10-CM

## 2011-03-05 DIAGNOSIS — T887XXA Unspecified adverse effect of drug or medicament, initial encounter: Secondary | ICD-10-CM

## 2011-03-05 DIAGNOSIS — M79609 Pain in unspecified limb: Secondary | ICD-10-CM

## 2011-03-05 DIAGNOSIS — M79671 Pain in right foot: Secondary | ICD-10-CM

## 2011-03-05 DIAGNOSIS — J40 Bronchitis, not specified as acute or chronic: Secondary | ICD-10-CM

## 2011-03-05 DIAGNOSIS — D519 Vitamin B12 deficiency anemia, unspecified: Secondary | ICD-10-CM

## 2011-03-05 MED ORDER — CYANOCOBALAMIN 1000 MCG/ML IJ SOLN
1000.0000 ug | INTRAMUSCULAR | Status: DC
  Administered 2011-03-05: 1000 ug via INTRAMUSCULAR

## 2011-03-05 MED ORDER — AMOXICILLIN-POT CLAVULANATE 875-125 MG PO TABS: 1.0000 | ORAL_TABLET | Freq: Two times a day (BID) | ORAL | Status: AC

## 2011-03-05 NOTE — Patient Instructions (Signed)
Patient was instructed to continue all medications as prescribed. To stop at the checkout desk and schedule a followup appointment  

## 2011-03-06 LAB — BASIC METABOLIC PANEL
BUN: 19 mg/dL (ref 6–23)
CO2: 29 mEq/L (ref 19–32)
Chloride: 105 mEq/L (ref 96–112)
Creat: 0.71 mg/dL (ref 0.50–1.10)
Glucose, Bld: 88 mg/dL (ref 70–99)

## 2011-03-11 ENCOUNTER — Encounter (HOSPITAL_BASED_OUTPATIENT_CLINIC_OR_DEPARTMENT_OTHER): Payer: Medicare Other | Attending: Internal Medicine

## 2011-03-11 DIAGNOSIS — Z79899 Other long term (current) drug therapy: Secondary | ICD-10-CM | POA: Insufficient documentation

## 2011-03-11 DIAGNOSIS — Z853 Personal history of malignant neoplasm of breast: Secondary | ICD-10-CM | POA: Insufficient documentation

## 2011-03-11 DIAGNOSIS — I10 Essential (primary) hypertension: Secondary | ICD-10-CM | POA: Insufficient documentation

## 2011-03-11 DIAGNOSIS — E1169 Type 2 diabetes mellitus with other specified complication: Secondary | ICD-10-CM | POA: Insufficient documentation

## 2011-03-11 DIAGNOSIS — L02619 Cutaneous abscess of unspecified foot: Secondary | ICD-10-CM | POA: Insufficient documentation

## 2011-03-11 DIAGNOSIS — L97509 Non-pressure chronic ulcer of other part of unspecified foot with unspecified severity: Secondary | ICD-10-CM | POA: Insufficient documentation

## 2011-03-11 DIAGNOSIS — L03119 Cellulitis of unspecified part of limb: Secondary | ICD-10-CM | POA: Insufficient documentation

## 2011-03-26 ENCOUNTER — Other Ambulatory Visit: Payer: Medicare Other

## 2011-03-29 ENCOUNTER — Ambulatory Visit (INDEPENDENT_AMBULATORY_CARE_PROVIDER_SITE_OTHER): Payer: Medicare Other | Admitting: Internal Medicine

## 2011-03-29 DIAGNOSIS — D518 Other vitamin B12 deficiency anemias: Secondary | ICD-10-CM

## 2011-03-29 DIAGNOSIS — D519 Vitamin B12 deficiency anemia, unspecified: Secondary | ICD-10-CM

## 2011-03-29 MED ORDER — CYANOCOBALAMIN 1000 MCG/ML IJ SOLN
1000.0000 ug | INTRAMUSCULAR | Status: DC
  Administered 2011-03-29 – 2011-05-10 (×2): 1500 ug via INTRAMUSCULAR

## 2011-04-05 ENCOUNTER — Other Ambulatory Visit: Payer: Self-pay | Admitting: Internal Medicine

## 2011-04-06 ENCOUNTER — Ambulatory Visit: Payer: Medicare Other | Admitting: Internal Medicine

## 2011-04-27 ENCOUNTER — Telehealth: Payer: Self-pay | Admitting: *Deleted

## 2011-04-27 MED ORDER — OSELTAMIVIR PHOSPHATE 75 MG PO CAPS: 75.0000 mg | ORAL_CAPSULE | Freq: Two times a day (BID) | ORAL | Status: AC

## 2011-04-27 NOTE — Telephone Encounter (Signed)
Rx sent to pharmacy. Pt aware.  

## 2011-04-27 NOTE — Telephone Encounter (Signed)
Pt is having sore throat, fever 102.6, head and chest congestion.  Advise pt that it's probably the flu and to take tylenol and drink plenty of fluids.

## 2011-04-27 NOTE — Telephone Encounter (Signed)
Per dr Lovell Sheehan- may have tamiflu 75 bid for 5 days

## 2011-04-29 ENCOUNTER — Encounter (HOSPITAL_BASED_OUTPATIENT_CLINIC_OR_DEPARTMENT_OTHER): Payer: Medicare Other

## 2011-05-03 ENCOUNTER — Other Ambulatory Visit: Payer: Self-pay | Admitting: Internal Medicine

## 2011-05-10 ENCOUNTER — Ambulatory Visit (INDEPENDENT_AMBULATORY_CARE_PROVIDER_SITE_OTHER): Payer: Medicare Other | Admitting: *Deleted

## 2011-05-10 DIAGNOSIS — D518 Other vitamin B12 deficiency anemias: Secondary | ICD-10-CM

## 2011-05-13 DIAGNOSIS — N905 Atrophy of vulva: Secondary | ICD-10-CM | POA: Diagnosis not present

## 2011-05-24 DIAGNOSIS — L0291 Cutaneous abscess, unspecified: Secondary | ICD-10-CM | POA: Diagnosis not present

## 2011-05-24 DIAGNOSIS — L0390 Cellulitis, unspecified: Secondary | ICD-10-CM | POA: Diagnosis not present

## 2011-05-27 ENCOUNTER — Ambulatory Visit (HOSPITAL_BASED_OUTPATIENT_CLINIC_OR_DEPARTMENT_OTHER): Payer: Medicare Other

## 2011-05-28 ENCOUNTER — Other Ambulatory Visit: Payer: Medicare Other

## 2011-05-31 ENCOUNTER — Other Ambulatory Visit (INDEPENDENT_AMBULATORY_CARE_PROVIDER_SITE_OTHER): Payer: Medicare Other

## 2011-05-31 DIAGNOSIS — E119 Type 2 diabetes mellitus without complications: Secondary | ICD-10-CM | POA: Diagnosis not present

## 2011-05-31 DIAGNOSIS — T887XXA Unspecified adverse effect of drug or medicament, initial encounter: Secondary | ICD-10-CM

## 2011-05-31 LAB — BASIC METABOLIC PANEL
BUN: 18 mg/dL (ref 6–23)
Calcium: 8.8 mg/dL (ref 8.4–10.5)
Creatinine, Ser: 1 mg/dL (ref 0.4–1.2)
GFR: 57.37 mL/min — ABNORMAL LOW (ref >60.00)
Glucose, Bld: 206 mg/dL — ABNORMAL HIGH (ref 70–99)
Potassium: 4.6 mEq/L (ref 3.5–5.1)

## 2011-06-03 DIAGNOSIS — L97509 Non-pressure chronic ulcer of other part of unspecified foot with unspecified severity: Secondary | ICD-10-CM | POA: Diagnosis not present

## 2011-06-03 DIAGNOSIS — M2040 Other hammer toe(s) (acquired), unspecified foot: Secondary | ICD-10-CM | POA: Diagnosis not present

## 2011-06-03 DIAGNOSIS — I739 Peripheral vascular disease, unspecified: Secondary | ICD-10-CM | POA: Diagnosis not present

## 2011-06-03 DIAGNOSIS — E1159 Type 2 diabetes mellitus with other circulatory complications: Secondary | ICD-10-CM | POA: Diagnosis not present

## 2011-06-03 DIAGNOSIS — L608 Other nail disorders: Secondary | ICD-10-CM | POA: Diagnosis not present

## 2011-06-04 ENCOUNTER — Ambulatory Visit (INDEPENDENT_AMBULATORY_CARE_PROVIDER_SITE_OTHER): Payer: Medicare Other | Admitting: Internal Medicine

## 2011-06-04 ENCOUNTER — Encounter: Payer: Self-pay | Admitting: Internal Medicine

## 2011-06-04 VITALS — BP 140/72 | HR 76 | Temp 98.2°F | Resp 16 | Ht 72.0 in | Wt 245.0 lb

## 2011-06-04 DIAGNOSIS — D518 Other vitamin B12 deficiency anemias: Secondary | ICD-10-CM

## 2011-06-04 DIAGNOSIS — G608 Other hereditary and idiopathic neuropathies: Secondary | ICD-10-CM | POA: Diagnosis not present

## 2011-06-04 DIAGNOSIS — N950 Postmenopausal bleeding: Secondary | ICD-10-CM | POA: Diagnosis not present

## 2011-06-04 DIAGNOSIS — E1165 Type 2 diabetes mellitus with hyperglycemia: Secondary | ICD-10-CM

## 2011-06-04 DIAGNOSIS — E1169 Type 2 diabetes mellitus with other specified complication: Secondary | ICD-10-CM

## 2011-06-04 DIAGNOSIS — IMO0002 Reserved for concepts with insufficient information to code with codable children: Secondary | ICD-10-CM | POA: Diagnosis not present

## 2011-06-04 DIAGNOSIS — D519 Vitamin B12 deficiency anemia, unspecified: Secondary | ICD-10-CM

## 2011-06-04 MED ORDER — CYANOCOBALAMIN 1000 MCG/ML IJ SOLN
1000.0000 ug | INTRAMUSCULAR | Status: DC
  Administered 2011-06-04: 1000 ug via INTRAMUSCULAR

## 2011-06-04 NOTE — Progress Notes (Signed)
Subjective:    Patient ID: [name], female    DOB: 05-Jun-1940, 71 y.o.   MRN: [id number]  HPI  The pt has a diabetic ulcer on her left foot second toe ( has a hammer toe). The patients CBG's are running in the 120 range but she does not check often Weight is stable and cannot loose but is not exercising. She has a history of hyperlipidemia B12 deficiency anemia peripheral neuropathy secondary to diabetes and B12 deficiency and a history of restless leg syndrome  Review of Systems  Constitutional: Negative for activity change, appetite change and fatigue.  HENT: Negative for ear pain, congestion, neck pain, postnasal drip and sinus pressure.   Eyes: Negative for redness and visual disturbance.  Respiratory: Negative for cough, shortness of breath and wheezing.   Gastrointestinal: Negative for abdominal pain and abdominal distention.  Genitourinary: Negative for dysuria, frequency and menstrual problem.  Musculoskeletal: Negative for myalgias, joint swelling and arthralgias.  Skin: Negative for rash and wound.  Neurological: Negative for dizziness, weakness and headaches.  Hematological: Negative for adenopathy. Does not bruise/bleed easily.  Psychiatric/Behavioral: Negative for sleep disturbance and decreased concentration.   Past Medical History  Diagnosis Date  . Hx of breast cancer   . Osteoporosis   . Shingles   . Diabetes mellitus   . Neuropathy   . AC (acromioclavicular) joint bone spurs   . Diverticulitis   . Heartburn   . Hypertension     History   Social History  . Marital Status: Married    Spouse Name: N/A    Number of Children: N/A  . Years of Education: N/A   Occupational History  . Not on file.   Social History Main Topics  . Smoking status: Never Smoker   . Smokeless tobacco: Not on file  . Alcohol Use: No  . Drug Use: No  . Sexually Active: Not Currently   Other Topics Concern  . Not on file   Social History Narrative  . No narrative  on file    Past Surgical History  Procedure Date  . Cholecystectomy 06/2008  . Tonsillectomy   . Thyroidectomy   . Total abdominal hysterectomy 2005  . Mastectomy 1994, 1995    Family History  Problem Relation Age of Onset  . Arthritis Mother   . COPD Father   . Heart disease Father   . Hypertension Father   . Hyperlipidemia Father     Allergies  Allergen Reactions  . Cephalexin     REACTION: unspecified  . Erythromycin Ethylsuccinate     REACTION: unspecified  . Nitrofurantoin     REACTION: unspecified  . Oxycodone-Acetaminophen     Current Outpatient Prescriptions on File Prior to Visit  Medication Sig Dispense Refill  . acetaminophen (TYLENOL) 500 MG tablet Take 500 mg by mouth every 6 (six) hours as needed.        [name] APAP-Isometheptene-Caffeine (PRODRIN) 500-130-20 MG TABS Take 2 tablets by mouth 2 (two) times daily as needed.        [name] aspirin 325 MG EC tablet Take 325 mg by mouth once a week.       [name] aspirin 81 MG tablet Take 81 mg by mouth. Daily 6 days a week      . atenolol (TENORMIN) 25 MG tablet TAKE ONE TABLET BY MOUTH EVERY DAY  30 tablet  11  . bimatoprost (LUMIGAN) 0.03 % ophthalmic drops Place 1 drop into both eyes at bedtime.        [name]  Bioflavonoid Products (ESTER-C) 500-550 MG TABS Take by mouth daily.        . Calcium Carbonate-Vitamin D (CALCIUM-VITAMIN D) 500-200 MG-UNIT per tablet Take 1 tablet by mouth 2 (two) times daily with meals.        . DHA-EPA-Coenzyme Q10-Vitamin E 120-180-50-30 CAPS Take by mouth.        . estradiol (ESTRING) 2 MG vaginal ring Place 2 mg vaginally every 3 (three) months. follow package directions       . ESZOPICLONE 3 MG tablet TAKE ONE TABLET BY MOUTH AT BEDTIME  30 tablet  3  . fish oil-omega-3 fatty acids 1000 MG capsule Take 1 g by mouth daily.        . Flaxseed, Linseed, (FLAX SEED OIL) 1000 MG CAPS Take by mouth daily.        [name] FLUoxetine (PROZAC) 20 MG capsule TAKE ONE CAPSULE BY MOUTH EVERY DAY  30 capsule  11  .  FREESTYLE LITE test strip ONE EVERY DAY  100 each  9  . glimepiride (AMARYL) 4 MG tablet TAKE ONE TABLET BY MOUTH EVERY DAY  30 tablet  11  . Grape Seed Extract 100 MG CAPS Take by mouth.        . magnesium gluconate (MAGONATE) 500 MG tablet Take 500 mg by mouth at bedtime.        . meloxicam (MOBIC) 15 MG tablet Take 1 tablet (15 mg total) by mouth daily.  30 tablet  6  . Multiple Vitamins-Minerals (CENTRUM SILVER PO) Take by mouth.        . pramipexole (MIRAPEX) 0.5 MG tablet TAKE ONE TABLET BY MOUTH AT BEDTIME  30 tablet  11  . sitaGLIPtan (JANUVIA) 100 MG tablet Take 1 tablet (100 mg total) by mouth daily.  30 tablet  11   Current Facility-Administered Medications on File Prior to Visit  Medication Dose Route Frequency Provider Last Rate Last Dose  . cyanocobalamin ((VITAMIN B-12)) injection 1,000 mcg  1,000 mcg Intramuscular Q30 days Carrie Mew, MD   1,500 mcg at 05/10/11 1219    BP 140/72  Pulse 76  Temp 98.2 F (36.8 C)  Resp 16  Ht 6' (1.829 m)  Wt 245 lb (111.131 kg)  BMI 33.23 kg/m2       Objective:   Physical Exam  Nursing note and vitals reviewed. Constitutional: She is oriented to person, place, and time. She appears well-developed and well-nourished. No distress.  HENT:  Head: Normocephalic and atraumatic.  Right Ear: External ear normal.  Left Ear: External ear normal.  Nose: Nose normal.  Mouth/Throat: Oropharynx is clear and moist.  Eyes: Conjunctivae and EOM are normal. Pupils are equal, round, and reactive to light.  Neck: Normal range of motion. Neck supple. No JVD present. No tracheal deviation present. No thyromegaly present.  Cardiovascular: Normal rate, regular rhythm, normal heart sounds and intact distal pulses.   No murmur heard. Pulmonary/Chest: Effort normal and breath sounds normal. She has no wheezes. She exhibits no tenderness.  Abdominal: Soft. Bowel sounds are normal.  Musculoskeletal: Normal range of motion. She exhibits no edema  and no tenderness.  Lymphadenopathy:    She has no cervical adenopathy.  Neurological: She is alert and oriented to person, place, and time. She has normal reflexes. No cranial nerve deficit.  Skin: Skin is warm and dry. She is not diaphoretic.  Psychiatric: She has a normal mood and affect. Her behavior is normal.  Assessment & Plan:  The ulcer on her foot it is worrisome because of her diabetes and peripheral neuropathy. We will begin treatment with Bactroban ointment 3 times a day to the site and recommend a podiatrist.  Her B12 deficiency will be treated with a B12 injection today. She has neuropathy which also complicates the foot ulcer treatment.  Her blood pressure is moderately well controlled but she admits to dietary noncompliance especially when it comes to salt we reviewed dietary compliance with patient both of her diabetes and hypertension

## 2011-06-04 NOTE — Patient Instructions (Signed)
The patient is instructed to continue all medications as prescribed. Schedule followup with check out clerk upon leaving the clinic  

## 2011-06-16 DIAGNOSIS — D251 Intramural leiomyoma of uterus: Secondary | ICD-10-CM | POA: Diagnosis not present

## 2011-06-16 DIAGNOSIS — N840 Polyp of corpus uteri: Secondary | ICD-10-CM | POA: Diagnosis not present

## 2011-06-16 DIAGNOSIS — N950 Postmenopausal bleeding: Secondary | ICD-10-CM | POA: Diagnosis not present

## 2011-06-18 ENCOUNTER — Encounter (HOSPITAL_COMMUNITY): Payer: Self-pay | Admitting: Pharmacist

## 2011-06-21 ENCOUNTER — Other Ambulatory Visit: Payer: Self-pay

## 2011-06-21 ENCOUNTER — Encounter (HOSPITAL_COMMUNITY)
Admission: RE | Admit: 2011-06-21 | Discharge: 2011-06-21 | Disposition: A | Discharge status: Home / Self Care | Payer: Medicare Other | Source: Ambulatory Visit | Attending: Obstetrics and Gynecology | Admitting: Obstetrics and Gynecology

## 2011-06-21 ENCOUNTER — Encounter (HOSPITAL_COMMUNITY): Payer: Self-pay

## 2011-06-21 DIAGNOSIS — Z01818 Encounter for other preprocedural examination: Secondary | ICD-10-CM | POA: Diagnosis not present

## 2011-06-21 DIAGNOSIS — N950 Postmenopausal bleeding: Secondary | ICD-10-CM | POA: Diagnosis not present

## 2011-06-21 DIAGNOSIS — D250 Submucous leiomyoma of uterus: Secondary | ICD-10-CM | POA: Diagnosis not present

## 2011-06-21 DIAGNOSIS — Z01812 Encounter for preprocedural laboratory examination: Secondary | ICD-10-CM | POA: Diagnosis not present

## 2011-06-21 HISTORY — DX: Other hammer toe(s) (acquired), unspecified foot: M20.40

## 2011-06-21 HISTORY — DX: Unspecified osteoarthritis, unspecified site: M19.90

## 2011-06-21 HISTORY — DX: Headache: R51

## 2011-06-21 LAB — COMPREHENSIVE METABOLIC PANEL
Alkaline Phosphatase: 76 U/L (ref 39–117)
BUN: 19 mg/dL (ref 6–23)
CO2: 29 mEq/L (ref 19–32)
GFR calc Af Amer: >90 mL/min (ref >90)
GFR calc non Af Amer: 85 mL/min — ABNORMAL LOW (ref >90)
Glucose, Bld: 175 mg/dL — ABNORMAL HIGH (ref 70–99)
Potassium: 4.4 mEq/L (ref 3.5–5.1)
Total Bilirubin: 0.4 mg/dL (ref 0.3–1.2)
Total Protein: 7 g/dL (ref 6.0–8.3)

## 2011-06-21 LAB — CBC
HCT: 41.1 % (ref 36.0–46.0)
Hemoglobin: 13 g/dL (ref 12.0–15.0)
MCHC: 31.6 g/dL (ref 30.0–36.0)
WBC: 7.5 10*3/uL (ref 4.0–10.5)

## 2011-06-21 NOTE — H&P (Signed)
[name] is an 71 y.o. female.   Chief Complaint:  Bleeding per vagina HPI:  71 yo G0 P0 with PMB since early Jan 2013  Past Medical History  Diagnosis Date  . Hx of breast cancer   . Osteoporosis   . Shingles   . Diabetes mellitus   . Neuropathy   . AC (acromioclavicular) joint bone spurs   . Diverticulitis   . Heartburn   . Hypertension   . Arthritis   . Headache     Migraines  . Hammer toe   . Skin ulcer     On end of left foot, second toe    Past Surgical History  Procedure Date  . Cholecystectomy 06/2008  . Tonsillectomy   . Thyroidectomy   . Mastectomy 1994, 1995  . Laparoscopic oophorectomy   . Laparotomy   . Diagnostic laparoscopy     Family History  Problem Relation Age of Onset  . Arthritis Mother   . COPD Father   . Heart disease Father   . Hypertension Father   . Hyperlipidemia Father    Social History:  reports that she has never smoked. She does not have any smokeless tobacco history on file. She reports that she does not drink alcohol or use illicit drugs.  Allergies:  Allergies  Allergen Reactions  . Cephalexin Other (See Comments)    Patient can't remember reaction  . Erythromycin Ethylsuccinate Hives and Diarrhea  . Nitrofurantoin Other (See Comments)    Severe headache    Medications Prior to Admission  Medication Dose Route Frequency Provider Last Rate Last Dose  . DISCONTD: cyanocobalamin ((VITAMIN B-12)) injection 1,000 mcg  1,000 mcg Intramuscular Q30 days Carrie Mew, MD   1,500 mcg at 05/10/11 1219  . DISCONTD: cyanocobalamin ((VITAMIN B-12)) injection 1,000 mcg  1,000 mcg Intramuscular Q30 days Carrie Mew, MD   1,000 mcg at 06/04/11 1110   Medications Prior to Admission  Medication Sig Dispense Refill  . APAP-Isometheptene-Caffeine (PRODRIN) 500-130-20 MG TABS Take 1 tablet by mouth as needed. For migraine       . aspirin 325 MG EC tablet Take 325 mg by mouth once a week. On Sundays      . aspirin 81 MG  tablet Take 81 mg by mouth. Daily Monday through Saturday      . atenolol (TENORMIN) 25 MG tablet TAKE ONE TABLET BY MOUTH EVERY DAY  30 tablet  11  . bimatoprost (LUMIGAN) 0.03 % ophthalmic drops Place 1 drop into both eyes at bedtime.        [name] Bioflavonoid Products (ESTER-C) 500-550 MG TABS Take 1 capsule by mouth daily.       [name] estradiol (ESTRING) 2 MG vaginal ring Place 2 mg vaginally every 3 (three) months. follow package directions       . ESZOPICLONE 3 MG tablet TAKE ONE TABLET BY MOUTH AT BEDTIME  30 tablet  3  . Flaxseed, Linseed, (FLAX SEED OIL) 1000 MG CAPS Take 1 capsule by mouth daily.       [name] FLUoxetine (PROZAC) 20 MG capsule TAKE ONE CAPSULE BY MOUTH EVERY DAY  30 capsule  11  . glimepiride (AMARYL) 4 MG tablet TAKE ONE TABLET BY MOUTH EVERY DAY  30 tablet  11  . Grape Seed Extract 100 MG CAPS Take 100 mg by mouth daily.       . magnesium gluconate (MAGONATE) 500 MG tablet Take 500 mg by mouth at bedtime.        [name]  meloxicam (MOBIC) 15 MG tablet Take 15 mg by mouth every other day.      . Multiple Vitamins-Minerals (CENTRUM SILVER PO) Take 1 tablet by mouth daily.       . pramipexole (MIRAPEX) 0.5 MG tablet TAKE ONE TABLET BY MOUTH AT BEDTIME  30 tablet  11  . sitaGLIPtan (JANUVIA) 100 MG tablet Take 1 tablet (100 mg total) by mouth daily.  30 tablet  11    Results for orders placed during the hospital encounter of 06/21/11 (from the past 48 hour(s))  CBC     Status: Normal   Collection Time   06/21/11 12:15 PM      Component Value Range Comment   WBC 7.5  4.0 - 10.5 (K/uL)    RBC 4.42  3.87 - 5.11 (MIL/uL)    Hemoglobin 13.0  12.0 - 15.0 (g/dL)    HCT 16.1  09.6 - 04.5 (%)    MCV 93.0  78.0 - 100.0 (fL)    MCH 29.4  26.0 - 34.0 (pg)    MCHC 31.6  30.0 - 36.0 (g/dL)    RDW 40.9  81.1 - 91.4 (%)    Platelets 226  150 - 400 (K/uL)   COMPREHENSIVE METABOLIC PANEL     Status: Abnormal   Collection Time   06/21/11 12:15 PM      Component Value Range Comment   Sodium 138  135  - 145 (mEq/L)    Potassium 4.4  3.5 - 5.1 (mEq/L)    Chloride 101  96 - 112 (mEq/L)    CO2 29  19 - 32 (mEq/L)    Glucose, Bld 175 (*) 70 - 99 (mg/dL)    BUN 19  6 - 23 (mg/dL)    Creatinine, Ser 7.82  0.50 - 1.10 (mg/dL)    Calcium 9.3  8.4 - 10.5 (mg/dL)    Total Protein 7.0  6.0 - 8.3 (g/dL)    Albumin 3.6  3.5 - 5.2 (g/dL)    AST 19  0 - 37 (U/L)    ALT 26  0 - 35 (U/L)    Alkaline Phosphatase 76  39 - 117 (U/L)    Total Bilirubin 0.4  0.3 - 1.2 (mg/dL)    GFR calc non Af Amer 85 (*) >90 (mL/min)    GFR calc Af Amer >90  >90 (mL/min)    No results found.  Review of Systems  All other systems reviewed and are negative.    There were no vitals taken for this visit. Physical Exam  Constitutional: She is oriented to person, place, and time. She appears well-developed and well-nourished.       obese  HENT:  Head: Normocephalic and atraumatic.  Eyes: Pupils are equal, round, and reactive to light.  Neck: Normal range of motion.  Cardiovascular: Normal rate and regular rhythm.   Respiratory: Effort normal.  GI: Soft. Bowel sounds are normal.  Genitourinary:       Uterus 6 x 3 x 4 cm with 4 small fibroids about 1 cm each or less.  SHSG showed a 1.4 cm polyp vs fibroid in the cavity.  Adnexa absent surgically.  Neurological: She is alert and oriented to person, place, and time.  Skin: Skin is warm and dry.  Psychiatric: She has a normal mood and affect.     Assessment/Plan Post menopausal bleeding.  Plan hysteroscopic resection of intrauterine mass, D & C.  Emmy Keng P 06/21/2011, 5:31 PM

## 2011-06-21 NOTE — Patient Instructions (Signed)
   Your procedure is scheduled on: Tuesday February 12 at 11:00am  Enter through the Main Entrance of [facility] at: 9:30am Pick up the phone at the desk and dial [phone] and inform us of your arrival.  Please call this number if you have any problems the morning of surgery: [phone]  Remember: Do not eat food after midnight: Monday Do not drink clear liquids after: Monday Take these medicines the morning of surgery with a SIP OF WATER:  Atenolol  Do not wear jewelry, make-up, or FINGER nail polish Do not wear lotions, powders, perfumes or deodorant. Do not shave 48 hours prior to surgery. Do not bring valuables to the hospital.  Patients discharged on the day of surgery will not be allowed to drive home.  Remember to use your hibiclens as instructed.Please shower with 1/2 bottle the evening before your surgery and the other 1/2 bottle the morning of surgery.

## 2011-06-21 NOTE — Anesthesia Preprocedure Evaluation (Addendum)
Anesthesia Evaluation  Patient identified by MRN, date of birth, ID band Patient awake    Reviewed: Allergy & Precautions, H&P , NPO status , Patient's Chart, lab work & pertinent test results, reviewed documented beta blocker date and time   Airway Mallampati: I TM Distance: <3 FB Neck ROM: Full    Dental No notable dental hx.    Pulmonary    Pulmonary exam normal       Cardiovascular hypertension, Pt. on medications and Pt. on home beta blockers     Neuro/Psych  Headaches,  Neuromuscular disease    GI/Hepatic Neg liver ROS, GERD-  ,  Endo/Other  Diabetes mellitus-, Type 2, Oral Hypoglycemic Agents  Renal/GU negative Renal ROS     Musculoskeletal   Abdominal Normal abdominal exam  (+)   Peds negative pediatric ROS (+)  Hematology negative hematology ROS (+)   Anesthesia Other Findings   Reproductive/Obstetrics negative OB ROS                          Anesthesia Physical Anesthesia Plan  ASA: II  Anesthesia Plan: General   Post-op Pain Management:    Induction: Intravenous  Airway Management Planned: LMA  Additional Equipment:   Intra-op Plan:   Post-operative Plan:   Informed Consent: I have reviewed the patients History and Physical, chart, labs and discussed the procedure including the risks, benefits and alternatives for the proposed anesthesia with the patient or authorized representative who has indicated his/her understanding and acceptance.     Plan Discussed with:   Anesthesia Plan Comments: (1. Pt will take her Atenolol the morning of surgery)       Anesthesia Quick Evaluation

## 2011-06-22 ENCOUNTER — Encounter (HOSPITAL_COMMUNITY)
Admission: RE | Disposition: A | Discharge status: Home / Self Care | Payer: Self-pay | Source: Ambulatory Visit | Attending: Obstetrics and Gynecology

## 2011-06-22 ENCOUNTER — Encounter (HOSPITAL_COMMUNITY): Payer: Self-pay | Admitting: *Deleted

## 2011-06-22 ENCOUNTER — Other Ambulatory Visit: Payer: Self-pay | Admitting: Obstetrics and Gynecology

## 2011-06-22 ENCOUNTER — Ambulatory Visit (HOSPITAL_COMMUNITY)
Admission: RE | Admit: 2011-06-22 | Discharge: 2011-06-22 | Disposition: A | Discharge status: Home / Self Care | Payer: Medicare Other | Source: Ambulatory Visit | Attending: Obstetrics and Gynecology | Admitting: Obstetrics and Gynecology

## 2011-06-22 ENCOUNTER — Encounter (HOSPITAL_COMMUNITY): Payer: Self-pay | Admitting: Anesthesiology

## 2011-06-22 ENCOUNTER — Ambulatory Visit (HOSPITAL_COMMUNITY): Payer: Medicare Other | Admitting: Anesthesiology

## 2011-06-22 DIAGNOSIS — Z01812 Encounter for preprocedural laboratory examination: Secondary | ICD-10-CM | POA: Diagnosis not present

## 2011-06-22 DIAGNOSIS — N841 Polyp of cervix uteri: Secondary | ICD-10-CM | POA: Diagnosis not present

## 2011-06-22 DIAGNOSIS — N924 Excessive bleeding in the premenopausal period: Secondary | ICD-10-CM | POA: Diagnosis not present

## 2011-06-22 DIAGNOSIS — Z01818 Encounter for other preprocedural examination: Secondary | ICD-10-CM | POA: Insufficient documentation

## 2011-06-22 DIAGNOSIS — N950 Postmenopausal bleeding: Secondary | ICD-10-CM | POA: Insufficient documentation

## 2011-06-22 DIAGNOSIS — D250 Submucous leiomyoma of uterus: Secondary | ICD-10-CM | POA: Insufficient documentation

## 2011-06-22 HISTORY — PX: HYSTEROSCOPY: SHX211

## 2011-06-22 HISTORY — PX: ENDOMETRIAL BIOPSY: SHX622

## 2011-06-22 LAB — GLUCOSE, CAPILLARY
Glucose-Capillary: 154 mg/dL — ABNORMAL HIGH (ref 70–99)
Glucose-Capillary: 119 mg/dL — ABNORMAL HIGH (ref 70–99)

## 2011-06-22 SURGERY — DILATATION & CURETTAGE/HYSTEROSCOPY WITH RESECTOCOPE
Anesthesia: General

## 2011-06-22 MED ORDER — MIDAZOLAM HCL 5 MG/5ML IJ SOLN
INTRAMUSCULAR | Status: DC | PRN
  Administered 2011-06-22: 1 mg via INTRAVENOUS

## 2011-06-22 MED ORDER — PROMETHAZINE HCL 25 MG/ML IJ SOLN: 6.2500 mg | INTRAMUSCULAR | Status: DC | PRN

## 2011-06-22 MED ORDER — PROPOFOL 10 MG/ML IV EMUL
INTRAVENOUS | Status: AC
  Filled 2011-06-22: qty 20

## 2011-06-22 MED ORDER — EPHEDRINE SULFATE 50 MG/ML IJ SOLN
INTRAMUSCULAR | Status: DC | PRN
  Administered 2011-06-22 (×2): 5 mg via INTRAVENOUS

## 2011-06-22 MED ORDER — FENTANYL CITRATE 0.05 MG/ML IJ SOLN
INTRAMUSCULAR | Status: AC
  Filled 2011-06-22: qty 2

## 2011-06-22 MED ORDER — LACTATED RINGERS IV SOLN
INTRAVENOUS | Status: DC
  Administered 2011-06-22: 10:00:00 via INTRAVENOUS

## 2011-06-22 MED ORDER — FENTANYL CITRATE 0.05 MG/ML IJ SOLN
INTRAMUSCULAR | Status: AC
  Administered 2011-06-22: 50 ug via INTRAVENOUS
  Filled 2011-06-22: qty 2

## 2011-06-22 MED ORDER — MEPERIDINE HCL 25 MG/ML IJ SOLN: 6.2500 mg | INTRAMUSCULAR | Status: DC | PRN

## 2011-06-22 MED ORDER — FAMOTIDINE 20 MG PO TABS
ORAL_TABLET | ORAL | Status: AC
  Administered 2011-06-22: 20 mg via ORAL
  Filled 2011-06-22: qty 1

## 2011-06-22 MED ORDER — LIDOCAINE HCL (CARDIAC) 20 MG/ML IV SOLN
INTRAVENOUS | Status: DC | PRN
  Administered 2011-06-22: 50 mg via INTRAVENOUS

## 2011-06-22 MED ORDER — PROPOFOL 10 MG/ML IV EMUL
INTRAVENOUS | Status: DC | PRN
  Administered 2011-06-22: 150 mg via INTRAVENOUS

## 2011-06-22 MED ORDER — LIDOCAINE HCL (CARDIAC) 20 MG/ML IV SOLN
INTRAVENOUS | Status: AC
  Filled 2011-06-22: qty 5

## 2011-06-22 MED ORDER — FENTANYL CITRATE 0.05 MG/ML IJ SOLN
25.0000 ug | INTRAMUSCULAR | Status: DC | PRN
  Administered 2011-06-22: 50 ug via INTRAVENOUS

## 2011-06-22 MED ORDER — INDIGOTINDISULFONATE SODIUM 8 MG/ML IJ SOLN
INTRAMUSCULAR | Status: AC
  Filled 2011-06-22: qty 5

## 2011-06-22 MED ORDER — FAMOTIDINE 20 MG PO TABS
20.0000 mg | ORAL_TABLET | Freq: Once | ORAL | Status: AC
  Administered 2011-06-22: 20 mg via ORAL

## 2011-06-22 MED ORDER — MIDAZOLAM HCL 2 MG/2ML IJ SOLN
INTRAMUSCULAR | Status: AC
  Filled 2011-06-22: qty 2

## 2011-06-22 MED ORDER — ONDANSETRON HCL 4 MG/2ML IJ SOLN
INTRAMUSCULAR | Status: DC | PRN
  Administered 2011-06-22: 4 mg via INTRAVENOUS

## 2011-06-22 MED ORDER — KETOROLAC TROMETHAMINE 30 MG/ML IJ SOLN: 15.0000 mg | Freq: Once | INTRAMUSCULAR | Status: DC | PRN

## 2011-06-22 MED ORDER — FENTANYL CITRATE 0.05 MG/ML IJ SOLN
INTRAMUSCULAR | Status: DC | PRN
  Administered 2011-06-22: 100 ug via INTRAVENOUS

## 2011-06-22 MED ORDER — EPHEDRINE 5 MG/ML INJ
INTRAVENOUS | Status: AC
  Filled 2011-06-22: qty 10

## 2011-06-22 MED ORDER — ONDANSETRON HCL 4 MG/2ML IJ SOLN
INTRAMUSCULAR | Status: AC
  Filled 2011-06-22: qty 2

## 2011-06-22 SURGICAL SUPPLY — 18 items
CANISTER SUCTION 2500CC (MISCELLANEOUS) ×2 IMPLANT
CATH ROBINSON RED A/P 16FR (CATHETERS) ×2 IMPLANT
CONTAINER PREFILL 10% NBF 60ML (FORM) ×4 IMPLANT
CORD ACTIVE DISPOSABLE (ELECTRODE) ×1
CORD ELECTRO ACTIVE DISP (ELECTRODE) ×1 IMPLANT
ELECT LOOP GYNE PRO 24FR (CUTTING LOOP)
ELECT REM PT RETURN 9FT ADLT (ELECTROSURGICAL) ×2
ELECT VAPORTRODE GRVD BAR (ELECTRODE) IMPLANT
ELECTRODE LOOP GYNE PRO 24FR (CUTTING LOOP) IMPLANT
ELECTRODE REM PT RTRN 9FT ADLT (ELECTROSURGICAL) ×1 IMPLANT
GLOVE BIOGEL PI IND STRL 7.0 (GLOVE) ×2 IMPLANT
GLOVE BIOGEL PI INDICATOR 7.0 (GLOVE) ×2
GLOVE ECLIPSE 6.5 STRL STRAW (GLOVE) ×2 IMPLANT
GOWN PREVENTION PLUS LG XLONG (DISPOSABLE) ×4 IMPLANT
GOWN STRL REIN XL XLG (GOWN DISPOSABLE) ×2 IMPLANT
PACK HYSTEROSCOPY LF (CUSTOM PROCEDURE TRAY) ×2 IMPLANT
TOWEL OR 17X24 6PK STRL BLUE (TOWEL DISPOSABLE) ×4 IMPLANT
WATER STERILE IRR 1000ML POUR (IV SOLUTION) ×2 IMPLANT

## 2011-06-22 NOTE — Op Note (Signed)
Preoperative diagnosis: Postmenopausal bleeding, and known intrauterine mass Postoperative diagnosis: Same, path pending  procedure:  Hysteroscopy with resection of submucous myoma, D&C Surgeon: Dr. Aram Beecham Carsten Carstarphen Anesthesia Gen. by LMA Estimated blood loss: Minimal Glycine deficit: 40 cc Procedure: The patient was taken to the operating room and after induction of adequate general anesthesia by LMA, she was placed in the dorsolithotomy position and prepped and draped in usual fashion the bladder was drained was drained with an in and out catheter.  The vaginal caliber was noted to be quite narrow and a posterior weighted retractor would not fit in the vagina, therefore a bivalve Graves speculum was placed and the vagina and the cervix visualized. The cervix was tilted quite posteriorly and was quite difficult to visualize.  The cervix was grasped on its anterior lip with a single-tooth tenaculum.  The uterus sounded to 7 cm.  The cervix was dilated to #31 Shawnie Pons.  The bivalve speculum was removed and anterior and posteriorDfevers were placed to ease insertion of the hysteroscope.  A tenaculum was placed on the posterior lip of the cervix.  The hysteroscope was introduced into the uterine cavity using glycine as a distention medium with the pressure pump set at 80 mm of mercury.  Hysteroscopy revealed there to be a pedunculated polyp in the lower uterine segment that was on a stalk that was quite thin and this was scraped off with a single loop without requiring cautery.  Further up in the uterus on the left uterine sidewall there were 2 submucous myomas that indented the cavity.  Photographic documentation was taken.  Single loop cautery was used to remove these myomas.  The specimen was sent to pathology.  The hysteroscope was removed.  Gentle sharp curettage was carried out and the specimen sent to pathology.  The instruments were  removed the vagina and the procedure was terminated.  Patient tolerated it  well when satisfactory condition to post anesthesia recovery.

## 2011-06-22 NOTE — Discharge Instructions (Signed)
Hysteroscopy  Hysteroscopy is a procedure used for looking inside the womb (uterus). It may be done for many different reasons.  AFTER THE PROCEDURE  If you had a general anesthetic, you may be groggy for a couple hours after the procedure.   If you had a local anesthetic, you will be advised to rest at the surgical center or caregiver's office until you are stable and feel ready to go home.   You may have some cramping for a couple days.   You may have bleeding, which varies from light spotting for a few days to menstrual-like bleeding for up to 3 to 7 days. This is normal.   Have someone take you home.    HOME CARE INSTRUCTIONS  Do not drive for 24 hours or as instructed.   Only take over-the-counter or prescription medicines for pain, discomfort, or fever as directed by your caregiver.   Do not take aspirin. It can cause or aggravate bleeding.   Do not drive or drink alcohol while taking pain medicine.   You may resume your usual diet.   Do not use tampons, douche, or have sexual intercourse for 2 weeks, or as advised by your caregiver.   Rest and sleep for the first 24 to 48 hours.   Take your temperature twice a day for 4 to 5 days. Write it down. Give these temperatures to your caregiver if they are abnormal (above 98.6 F or 37.0 C).   Take medicines your caregiver has ordered as directed.   Follow your caregiver's advice regarding diet, exercise, lifting, driving, and general activities.   Take showers instead of baths for 2 weeks, or as recommended by your caregiver.   If you develop constipation: Take a mild laxative with the advice of your caregiver.   Eat bran foods.   Drink enough water and fluids to keep your urine clear or pale yellow.   Try to have someone with you or available to you for the first 24 to 48 hours, especially if you had a general anesthetic.   Make sure you and your family understand everything about your operation and recovery.    Follow your caregiver's advice regarding follow-up appointments.   FINDING OUT THE RESULTS OF YOUR TEST Not all test results are available during your visit. If your test results are not back during the visit, make an appointment with your caregiver to find out the results. Do not assume everything is normal if you have not heard from your caregiver or the medical facility. It is important for you to follow up on all of your test results.  SEEK MEDICAL CARE IF:  You feel dizzy or lightheaded.   You feel sick to your stomach (nauseous).   You develop abnormal vaginal discharge.   You develop a rash.   You have an abnormal reaction or allergy to your medicine.   You need stronger pain medicine.    SEEK IMMEDIATE MEDICAL CARE IF:  Bleeding is heavier than a normal menstrual period   You have an oral temperature above 100.5 not controlled by medicine.   You have increasing cramps or pains not relieved with medicine.   You develop belly (abdominal) pain that does not seem to be related to the same area of earlier cramping and pain.   You pass out.   You develop shortness of breath.   MAKE SURE YOU:  Understand these instructions.   Will watch your condition.  Will get help right away if you  are not doing well or get worse.  Hysteroscopy  Hysteroscopy is a procedure used for looking inside the womb (uterus). It may be done for many different reasons.  AFTER THE PROCEDURE If you had a general anesthetic, you may be groggy for a couple hours after the procedure.  If you had a local anesthetic, you will be advised to rest at the surgical center or caregiver's office until you are stable and feel ready to go home.  You may have some cramping for a couple days.  You may have bleeding, which varies from light spotting for a few days to menstrual-like bleeding for up to 3 to 7 days. This is normal.  Have someone take you home.   HOME CARE INSTRUCTIONS Do not drive for 24  hours or as instructed.  Only take over-the-counter or prescription medicines for pain, discomfort, or fever as directed by your caregiver.  Do not take aspirin. It can cause or aggravate bleeding.  Do not drive or drink alcohol while taking pain medicine.  You may resume your usual diet.  Do not use tampons, douche, or have sexual intercourse for 2 weeks, or as advised by your caregiver.  Rest and sleep for the first 24 to 48 hours.  Take your temperature twice a day for 4 to 5 days. Write it down. Give these temperatures to your caregiver if they are abnormal (above 98.6 F or 37.0 C).  Take medicines your caregiver has ordered as directed.  Follow your caregiver's advice regarding diet, exercise, lifting, driving, and general activities.  Take showers instead of baths for 2 weeks, or as recommended by your caregiver.  If you develop constipation: Take a mild laxative with the advice of your caregiver.  Eat bran foods.  Drink enough water and fluids to keep your urine clear or pale yellow.  Try to have someone with you or available to you for the first 24 to 48 hours, especially if you had a general anesthetic.  Make sure you and your family understand everything about your operation and recovery.  Follow your caregiver's advice regarding follow-up appointments.   FINDING OUT THE RESULTS OF YOUR TEST Not all test results are available during your visit. If your test results are not back during the visit, make an appointment with your caregiver to find out the results. Do not assume everything is normal if you have not heard from your caregiver or the medical facility. It is important for you to follow up on all of your test results.  SEEK MEDICAL CARE IF: You feel dizzy or lightheaded.  You feel sick to your stomach (nauseous).  You develop abnormal vaginal discharge.  You develop a rash.  You have an abnormal reaction or allergy to your medicine.  You need stronger pain medicine.     SEEK IMMEDIATE MEDICAL CARE IF: Bleeding is heavier than a normal menstrual period  You have an oral temperature above 100.5 not controlled by medicine.  You have increasing cramps or pains not relieved with medicine.  You develop belly (abdominal) pain that does not seem to be related to the same area of earlier cramping and pain.  You pass out.  You develop shortness of breath.   MAKE SURE YOU: Understand these instructions.  Will watch your condition.   Will get help right away if you are not doing well or get worse.

## 2011-06-22 NOTE — Anesthesia Postprocedure Evaluation (Signed)
Anesthesia Post Note  Patient: [name]  Procedure(s) Performed: Procedure(s) (LRB): DILATATION & CURETTAGE/HYSTEROSCOPY WITH RESECTOCOPE (N/A)  Anesthesia type: General  Patient location: PACU  Post pain: Pain level controlled  Post assessment: Post-op Vital signs reviewed  Last Vitals:  Filed Vitals:   06/22/11 0935  BP: 150/72  Pulse: 81  Temp: 37 C  Resp: 16    Post vital signs: Reviewed  Level of consciousness: sedated  Complications: No apparent anesthesia complicationsfj

## 2011-06-22 NOTE — Transfer of Care (Signed)
Immediate Anesthesia Transfer of Care Note  Patient: [name]  Procedure(s) Performed: Procedure(s) (LRB): DILATATION & CURETTAGE/HYSTEROSCOPY WITH RESECTOCOPE (N/A)  Patient Location: PACU  Anesthesia Type: General  Level of Consciousness: awake, alert  and oriented  Airway & Oxygen Therapy: Patient Spontanous Breathing and Patient connected to nasal cannula oxygen  Post-op Assessment: Report given to PACU RN and Post -op Vital signs reviewed and stable  Post vital signs: Reviewed and stable  Complications: No apparent anesthesia complications

## 2011-06-22 NOTE — Interval H&P Note (Signed)
History and Physical Interval Note:  06/22/2011 10:49 AM  [name]  has presented today for surgery, with the diagnosis of post-menopausal bleeding  The various methods of treatment have been discussed with the patient and family. After consideration of risks, benefits and other options for treatment, the patient has consented to  Procedure(s) (LRB): DILATATION & CURETTAGE/HYSTEROSCOPY WITH RESECTOCOPE (N/A) as a surgical intervention .  The patients' history has been reviewed, patient examined, no change in status, stable for surgery.  I have reviewed the patients' chart and labs.  Questions were answered to the patient's satisfaction.     ROMINE,CYNTHIA P

## 2011-06-22 NOTE — Anesthesia Procedure Notes (Signed)
Procedure Name: LMA Insertion Date/Time: 06/22/2011 11:08 AM Performed by: Karleen Dolphin Pre-anesthesia Checklist: Patient identified, Timeout performed, Emergency Drugs available, Suction available and Patient being monitored Patient Re-evaluated:Patient Re-evaluated prior to inductionOxygen Delivery Method: Circle System Utilized Preoxygenation: Pre-oxygenation with 100% oxygen Intubation Type: IV induction LMA: LMA inserted LMA Size: 4.0 Number of attempts: 1 Placement Confirmation: positive ETCO2 and breath sounds checked- equal and bilateral Tube secured with: Tape Dental Injury: Teeth and Oropharynx as per pre-operative assessment

## 2011-07-15 ENCOUNTER — Ambulatory Visit (INDEPENDENT_AMBULATORY_CARE_PROVIDER_SITE_OTHER): Payer: Medicare Other | Admitting: Internal Medicine

## 2011-07-15 DIAGNOSIS — D518 Other vitamin B12 deficiency anemias: Secondary | ICD-10-CM | POA: Diagnosis not present

## 2011-07-15 DIAGNOSIS — D519 Vitamin B12 deficiency anemia, unspecified: Secondary | ICD-10-CM

## 2011-07-15 DIAGNOSIS — H4011X0 Primary open-angle glaucoma, stage unspecified: Secondary | ICD-10-CM | POA: Diagnosis not present

## 2011-07-15 DIAGNOSIS — H409 Unspecified glaucoma: Secondary | ICD-10-CM | POA: Diagnosis not present

## 2011-07-15 MED ORDER — CYANOCOBALAMIN 1000 MCG/ML IJ SOLN
1500.0000 ug | Freq: Once | INTRAMUSCULAR | Status: AC
  Administered 2011-07-15: 1500 ug via INTRAMUSCULAR

## 2011-08-01 ENCOUNTER — Other Ambulatory Visit: Payer: Self-pay | Admitting: Internal Medicine

## 2011-08-10 ENCOUNTER — Ambulatory Visit (INDEPENDENT_AMBULATORY_CARE_PROVIDER_SITE_OTHER): Payer: Medicare Other | Admitting: Internal Medicine

## 2011-08-10 DIAGNOSIS — D518 Other vitamin B12 deficiency anemias: Secondary | ICD-10-CM | POA: Diagnosis not present

## 2011-08-10 DIAGNOSIS — D519 Vitamin B12 deficiency anemia, unspecified: Secondary | ICD-10-CM

## 2011-08-10 MED ORDER — CYANOCOBALAMIN 1000 MCG/ML IJ SOLN
1500.0000 ug | INTRAMUSCULAR | Status: AC
  Administered 2011-08-10: 1500 ug via INTRAMUSCULAR

## 2011-08-11 DIAGNOSIS — N952 Postmenopausal atrophic vaginitis: Secondary | ICD-10-CM | POA: Diagnosis not present

## 2011-08-12 DIAGNOSIS — L84 Corns and callosities: Secondary | ICD-10-CM | POA: Diagnosis not present

## 2011-08-12 DIAGNOSIS — L608 Other nail disorders: Secondary | ICD-10-CM | POA: Diagnosis not present

## 2011-08-12 DIAGNOSIS — I739 Peripheral vascular disease, unspecified: Secondary | ICD-10-CM | POA: Diagnosis not present

## 2011-08-12 DIAGNOSIS — E1159 Type 2 diabetes mellitus with other circulatory complications: Secondary | ICD-10-CM | POA: Diagnosis not present

## 2011-08-18 DIAGNOSIS — M1710 Unilateral primary osteoarthritis, unspecified knee: Secondary | ICD-10-CM | POA: Diagnosis not present

## 2011-08-18 DIAGNOSIS — IMO0002 Reserved for concepts with insufficient information to code with codable children: Secondary | ICD-10-CM | POA: Diagnosis not present

## 2011-08-25 DIAGNOSIS — M1710 Unilateral primary osteoarthritis, unspecified knee: Secondary | ICD-10-CM | POA: Diagnosis not present

## 2011-08-25 DIAGNOSIS — IMO0002 Reserved for concepts with insufficient information to code with codable children: Secondary | ICD-10-CM | POA: Diagnosis not present

## 2011-08-30 ENCOUNTER — Other Ambulatory Visit: Payer: Self-pay | Admitting: Internal Medicine

## 2011-09-01 DIAGNOSIS — IMO0002 Reserved for concepts with insufficient information to code with codable children: Secondary | ICD-10-CM | POA: Diagnosis not present

## 2011-09-01 DIAGNOSIS — M1710 Unilateral primary osteoarthritis, unspecified knee: Secondary | ICD-10-CM | POA: Diagnosis not present

## 2011-09-03 ENCOUNTER — Ambulatory Visit (INDEPENDENT_AMBULATORY_CARE_PROVIDER_SITE_OTHER): Payer: Medicare Other | Admitting: Internal Medicine

## 2011-09-03 ENCOUNTER — Encounter: Payer: Self-pay | Admitting: Internal Medicine

## 2011-09-03 VITALS — BP 142/80 | HR 80 | Temp 98.6°F | Resp 16 | Ht 72.0 in | Wt 246.0 lb

## 2011-09-03 DIAGNOSIS — G4700 Insomnia, unspecified: Secondary | ICD-10-CM | POA: Diagnosis not present

## 2011-09-03 DIAGNOSIS — Z0000 Encounter for general adult medical examination without abnormal findings: Secondary | ICD-10-CM

## 2011-09-03 DIAGNOSIS — IMO0001 Reserved for inherently not codable concepts without codable children: Secondary | ICD-10-CM

## 2011-09-03 DIAGNOSIS — D518 Other vitamin B12 deficiency anemias: Secondary | ICD-10-CM | POA: Diagnosis not present

## 2011-09-03 MED ORDER — QUETIAPINE FUMARATE 25 MG PO TABS: 25.0000 mg | ORAL_TABLET | Freq: Every day | ORAL | Status: DC

## 2011-09-03 MED ORDER — CYANOCOBALAMIN 1000 MCG/ML IJ SOLN
1000.0000 ug | INTRAMUSCULAR | Status: DC
  Administered 2011-09-03: 11:00:00 via INTRAMUSCULAR

## 2011-09-03 NOTE — Patient Instructions (Signed)
Trial of Seroquel for sleep

## 2011-09-03 NOTE — Progress Notes (Signed)
  Subjective:    Patient ID: [name], female    DOB: [birth year], 71 y.o.   MRN: [id number]  HPI Elevated blood glucose, weight gain, sitting too much Motivation Mood stable but not motivated   Review of Systems  Constitutional: Negative for activity change, appetite change and fatigue.  HENT: Negative for ear pain, congestion, neck pain, postnasal drip and sinus pressure.   Eyes: Negative for redness and visual disturbance.  Respiratory: Negative for cough, shortness of breath and wheezing.   Gastrointestinal: Negative for abdominal pain and abdominal distention.  Genitourinary: Negative for dysuria, frequency and menstrual problem.  Musculoskeletal: Negative for myalgias, joint swelling and arthralgias.  Skin: Negative for rash and wound.  Neurological: Negative for dizziness, weakness and headaches.  Hematological: Negative for adenopathy. Does not bruise/bleed easily.  Psychiatric/Behavioral: Negative for sleep disturbance and decreased concentration.       Objective:   Physical Exam  Constitutional: She is oriented to person, place, and time. She appears well-developed and well-nourished. No distress.  HENT:  Head: Normocephalic and atraumatic.  Right Ear: External ear normal.  Left Ear: External ear normal.  Nose: Nose normal.  Mouth/Throat: Oropharynx is clear and moist.  Eyes: Conjunctivae and EOM are normal. Pupils are equal, round, and reactive to light.  Neck: Normal range of motion. Neck supple. No JVD present. No tracheal deviation present. No thyromegaly present.  Cardiovascular: Normal rate, regular rhythm, normal heart sounds and intact distal pulses.   No murmur heard. Pulmonary/Chest: Effort normal and breath sounds normal. She has no wheezes. She exhibits no tenderness.  Abdominal: Soft. Bowel sounds are normal.  Musculoskeletal: Normal range of motion. She exhibits no edema and no tenderness.  Lymphadenopathy:    She has no cervical adenopathy.    Neurological: She is alert and oriented to person, place, and time. She has normal reflexes. No cranial nerve deficit.  Skin: Skin is warm and dry. She is not diaphoretic.  Psychiatric: She has a normal mood and affect. Her behavior is normal.          Assessment & Plan:  We discussed the role of obesity and hypertension her diabetes and her osteoarthritis primary intervention today is to discuss obesity and the fact that diet and exercise her primary interventions for treating her diabetes and her hypertension.  She'll continue all current medications review the diet plan for diabetes  I have spent more than 30 minutes examining this patient face-to-face of which over half was spent in counseling about weight loss diabetes and hyperlipidemia.

## 2011-09-06 ENCOUNTER — Telehealth: Payer: Self-pay | Admitting: *Deleted

## 2011-09-06 MED ORDER — ESZOPICLONE 3 MG PO TABS: 3.0000 mg | ORAL_TABLET | Freq: Every day | ORAL | Status: DC

## 2011-09-06 NOTE — Telephone Encounter (Signed)
Pt states the Seroquel is not working.  Causing her to urinate frequently, neuropathy has started up again, restless leg is worse.  Try something different? Or change back to Lunesta?

## 2011-09-08 DIAGNOSIS — IMO0002 Reserved for concepts with insufficient information to code with codable children: Secondary | ICD-10-CM | POA: Diagnosis not present

## 2011-09-08 DIAGNOSIS — M1710 Unilateral primary osteoarthritis, unspecified knee: Secondary | ICD-10-CM | POA: Diagnosis not present

## 2011-09-15 DIAGNOSIS — IMO0002 Reserved for concepts with insufficient information to code with codable children: Secondary | ICD-10-CM | POA: Diagnosis not present

## 2011-09-15 DIAGNOSIS — M1710 Unilateral primary osteoarthritis, unspecified knee: Secondary | ICD-10-CM | POA: Diagnosis not present

## 2011-09-30 ENCOUNTER — Ambulatory Visit (INDEPENDENT_AMBULATORY_CARE_PROVIDER_SITE_OTHER): Payer: Medicare Other | Admitting: *Deleted

## 2011-09-30 DIAGNOSIS — D519 Vitamin B12 deficiency anemia, unspecified: Secondary | ICD-10-CM

## 2011-09-30 DIAGNOSIS — D518 Other vitamin B12 deficiency anemias: Secondary | ICD-10-CM | POA: Diagnosis not present

## 2011-09-30 MED ORDER — CYANOCOBALAMIN 1000 MCG/ML IJ SOLN
1500.0000 ug | INTRAMUSCULAR | Status: DC
  Administered 2011-09-30: 1500 ug via INTRAMUSCULAR

## 2011-10-01 ENCOUNTER — Telehealth: Payer: Self-pay | Admitting: *Deleted

## 2011-10-01 MED ORDER — SULFAMETHOXAZOLE-TRIMETHOPRIM 800-160 MG PO TABS: 1.0000 | ORAL_TABLET | Freq: Two times a day (BID) | ORAL | Status: AC

## 2011-10-01 NOTE — Telephone Encounter (Signed)
Rx called in and patient is aware 

## 2011-10-01 NOTE — Telephone Encounter (Signed)
Per dr Lovell Sheehan- may have septra ds bid for 10 days

## 2011-10-01 NOTE — Telephone Encounter (Signed)
Patient is calling because she believes she may have bronchitis again.  She has had a chest cough for about a week with some fever.  She would like a prescription called in if possible.  She states that Septra or the last Antibiotic prescribed worked well for her.  She does not want a Z-Pak.  Statistician

## 2011-10-05 ENCOUNTER — Other Ambulatory Visit: Payer: Self-pay | Admitting: *Deleted

## 2011-10-05 MED ORDER — ATENOLOL 25 MG PO TABS: 25.0000 mg | ORAL_TABLET | Freq: Every day | ORAL | Status: DC

## 2011-10-07 ENCOUNTER — Encounter: Payer: Self-pay | Admitting: Family Medicine

## 2011-10-07 ENCOUNTER — Ambulatory Visit (INDEPENDENT_AMBULATORY_CARE_PROVIDER_SITE_OTHER): Payer: Medicare Other | Admitting: Family Medicine

## 2011-10-07 ENCOUNTER — Telehealth: Payer: Self-pay | Admitting: Internal Medicine

## 2011-10-07 VITALS — BP 138/76 | HR 76 | Temp 98.5°F | Resp 16 | Wt 246.0 lb

## 2011-10-07 DIAGNOSIS — J209 Acute bronchitis, unspecified: Secondary | ICD-10-CM

## 2011-10-07 MED ORDER — HYDROCODONE-HOMATROPINE 5-1.5 MG/5ML PO SYRP: 5.0000 mL | ORAL_SOLUTION | Freq: Four times a day (QID) | ORAL | Status: AC | PRN

## 2011-10-07 NOTE — Progress Notes (Signed)
  Subjective:    Patient ID: [name], female    DOB: Mar 14, 1941, 71 y.o.   MRN: [id number]  HPI  Patient seen with cough over the past couple weeks. Mostly dry but recently productive of some green sputum. 6 days ago to Septra DS called in which she is taking. She's had no fever. No hemoptysis. Possibly mild wheezing off and on. Never smoked. No history of asthma. She has type 2 diabetes among other medical problems. Cough is especially bothersome at night.   Review of Systems  Constitutional: Negative for fever and chills.  HENT: Negative for congestion.   Respiratory: Positive for cough. Negative for shortness of breath.   Cardiovascular: Negative for chest pain, palpitations and leg swelling.       Objective:   Physical Exam  Constitutional: She appears well-developed and well-nourished.  HENT:  Right Ear: External ear normal.  Left Ear: External ear normal.  Mouth/Throat: Oropharynx is clear and moist.  Neck: Neck supple.  Cardiovascular: Normal rate and regular rhythm.   Pulmonary/Chest: Effort normal and breath sounds normal. No respiratory distress. She has no wheezes. She has no rales.  Musculoskeletal: She exhibits no edema.  Lymphadenopathy:    She has no cervical adenopathy.          Assessment & Plan:  Acute bronchitis. Suspect viral. She will finish out Septra DS which has already started. Hydromet cough syrup 1 teaspoon every 6 hours when necessary. Follow up promptly for any fever or worsening symptoms

## 2011-10-07 NOTE — Telephone Encounter (Signed)
Appointment made with dr burchette this  p m

## 2011-10-07 NOTE — Patient Instructions (Signed)

## 2011-10-07 NOTE — Telephone Encounter (Signed)
Patient came in stating that her bronchitis is no better and would like to come in to see the MD. Patient declined to see the NP. Patient would like the advice of the MD. Please advise.

## 2011-10-21 ENCOUNTER — Ambulatory Visit (INDEPENDENT_AMBULATORY_CARE_PROVIDER_SITE_OTHER): Payer: Medicare Other | Admitting: Internal Medicine

## 2011-10-21 ENCOUNTER — Other Ambulatory Visit: Payer: Self-pay | Admitting: *Deleted

## 2011-10-21 DIAGNOSIS — E538 Deficiency of other specified B group vitamins: Secondary | ICD-10-CM

## 2011-10-21 MED ORDER — CYANOCOBALAMIN 1000 MCG/ML IJ SOLN
1500.0000 ug | INTRAMUSCULAR | Status: DC
  Administered 2011-10-21: 1500 ug via INTRAMUSCULAR

## 2011-10-25 DIAGNOSIS — L84 Corns and callosities: Secondary | ICD-10-CM | POA: Diagnosis not present

## 2011-10-25 DIAGNOSIS — L608 Other nail disorders: Secondary | ICD-10-CM | POA: Diagnosis not present

## 2011-10-25 DIAGNOSIS — I739 Peripheral vascular disease, unspecified: Secondary | ICD-10-CM | POA: Diagnosis not present

## 2011-10-25 DIAGNOSIS — E1159 Type 2 diabetes mellitus with other circulatory complications: Secondary | ICD-10-CM | POA: Diagnosis not present

## 2011-10-30 ENCOUNTER — Emergency Department (INDEPENDENT_AMBULATORY_CARE_PROVIDER_SITE_OTHER): Payer: Medicare Other

## 2011-10-30 ENCOUNTER — Encounter (HOSPITAL_COMMUNITY): Payer: Self-pay | Admitting: *Deleted

## 2011-10-30 ENCOUNTER — Emergency Department (INDEPENDENT_AMBULATORY_CARE_PROVIDER_SITE_OTHER)
Admission: EM | Admit: 2011-10-30 | Discharge: 2011-10-30 | Disposition: A | Discharge status: Home / Self Care | Payer: Medicare Other | Source: Home / Self Care | Attending: Emergency Medicine | Admitting: Emergency Medicine

## 2011-10-30 DIAGNOSIS — M25572 Pain in left ankle and joints of left foot: Secondary | ICD-10-CM

## 2011-10-30 DIAGNOSIS — M25579 Pain in unspecified ankle and joints of unspecified foot: Secondary | ICD-10-CM

## 2011-10-30 DIAGNOSIS — M19079 Primary osteoarthritis, unspecified ankle and foot: Secondary | ICD-10-CM | POA: Diagnosis not present

## 2011-10-30 DIAGNOSIS — M7730 Calcaneal spur, unspecified foot: Secondary | ICD-10-CM | POA: Diagnosis not present

## 2011-10-30 DIAGNOSIS — M25879 Other specified joint disorders, unspecified ankle and foot: Secondary | ICD-10-CM | POA: Diagnosis not present

## 2011-10-30 MED ORDER — MELOXICAM 15 MG PO TABS: 15.0000 mg | ORAL_TABLET | Freq: Every day | ORAL | Status: DC

## 2011-10-30 MED ORDER — HYDROCODONE-ACETAMINOPHEN 5-325 MG PO TABS
2.0000 | ORAL_TABLET | ORAL | Status: AC | PRN
Start: 2011-10-30 — End: 2011-11-09

## 2011-10-30 NOTE — Discharge Instructions (Signed)
You need to use a walker or a cane to help stabilize you while wearing the ASO. Keep your ankle elevated above your heart as much as possible. Ice it for 20 minutes a time. You may take meloxicam once daily. Take the Norco only for severe pain. this medicine can increase your risk for fall, as it is very sedating.  You may take 1 g of Tylenol up to 4 times a day for mild to moderate pain. Do not take the Norco and the Tylenol, as they both have Tylenol in them, and too much can hurt your liver. Do not exceed 4 g of Tylenol from all sources per day. Return if you've a fever above 100.4, if you get worse, or any other concerns.

## 2011-10-30 NOTE — ED Provider Notes (Signed)
History     CSN: [id number]  Arrival date & time 10/30/11  1640   First MD Initiated Contact with Patient 10/30/11 1726      Chief Complaint  Patient presents with  . Foot Pain  . Ankle Pain  . Leg Swelling    (Consider location/radiation/quality/duration/timing/severity/associated sxs/prior treatment) HPI Comments: Patient reports several weeks of increasing medial left ankle pain. She states that has become especially bothersome over the past week, when she changed to some less supportive shoes. Reports swelling. Has history of injury to this ankle, and was placed in a Cam Walker by her orthopedic surgeon. She tried using this, but states that her ankle is swollen and painful for her to wear this consistently. No recent injury to the ankle.  Patient is a 71 y.o. female presenting with ankle pain. The history is provided by the patient. No language interpreter was used.  Ankle Pain  The incident occurred more than 1 week ago. The injury mechanism is unknown. The pain is present in the left ankle. The quality of the pain is described as aching. The pain has been constant since onset. Pertinent negatives include no numbness, no loss of sensation and no tingling. The symptoms are aggravated by bearing weight, activity and palpation. She has tried ice, rest and immobilization for the symptoms. The treatment provided no relief.    Past Medical History  Diagnosis Date  . Hx of breast cancer   . Osteoporosis   . Shingles   . Diabetes mellitus   . Neuropathy   . AC (acromioclavicular) joint bone spurs   . Diverticulitis   . Heartburn   . Hypertension   . Arthritis   . Headache     Migraines  . Hammer toe   . Skin ulcer     On end of left foot, second toe    Past Surgical History  Procedure Date  . Cholecystectomy 06/2008  . Tonsillectomy   . Thyroidectomy   . Mastectomy 1994, 1995  . Laparoscopic oophorectomy   . Laparotomy   . Diagnostic laparoscopy     Family History    Problem Relation Age of Onset  . Arthritis Mother   . COPD Father   . Heart disease Father   . Hypertension Father   . Hyperlipidemia Father     History  Substance Use Topics  . Smoking status: Never Smoker   . Smokeless tobacco: Not on file  . Alcohol Use: No    OB History    Grav Para Term Preterm Abortions TAB SAB Ect Mult Living                  Review of Systems  Neurological: Negative for tingling and numbness.    Allergies  Cephalexin; Erythromycin ethylsuccinate; and Nitrofurantoin  Home Medications   Current Outpatient Rx  Name Route Sig Dispense Refill  . ALPHA-LIPOIC ACID 600 MG PO CAPS Oral Take 1 capsule by mouth daily.    [name] APAP-ISOMETHEPTENE-CAFFEINE 500-130-20 MG PO TABS Oral Take 1 tablet by mouth as needed. For migraine     . ASPIRIN 81 MG PO TABS Oral Take 81 mg by mouth. Daily Monday through Saturday    . ATENOLOL 25 MG PO TABS Oral Take 1 tablet (25 mg total) by mouth daily. 90 tablet 3  . BIMATOPROST 0.03 % OP SOLN Both Eyes Place 1 drop into both eyes at bedtime.      . ESTER-C 500-550 MG PO TABS Oral Take 1 capsule  by mouth daily.     [name] BRINZOLAMIDE 1 % OP SUSP Both Eyes Place 1 drop into both eyes 2 (two) times daily.    [name] POSTURE-D PO Oral Take 1 tablet by mouth 2 (two) times daily.    [name] VITAMIN D 1000 UNITS PO TABS Oral Take 1,000 Units by mouth daily.    [name] CO-ENZYME Q-10 50 MG PO CAPS Oral Take 150 mg by mouth daily.    . CYANOCOBALAMIN 1000 MCG/ML IJ SOLN Intramuscular Inject 1,000 mcg into the muscle every 21 ( twenty-one) days. Last dose 1/25    . ESZOPICLONE 3 MG PO TABS Oral Take 1 tablet (3 mg total) by mouth at bedtime. Take immediately before bedtime 30 tablet 2  . FLAX SEED OIL 1000 MG PO CAPS Oral Take 1 capsule by mouth daily.     [name] FLUOXETINE HCL 20 MG PO CAPS  TAKE ONE CAPSULE BY MOUTH EVERY DAY 30 capsule 6  . GLIMEPIRIDE 4 MG PO TABS  TAKE ONE TABLET BY MOUTH EVERY DAY 30 tablet 11  . GRAPE SEED EXTRACT 100 MG PO CAPS Oral  Take 100 mg by mouth daily.     [name] MAGNESIUM GLUCONATE 500 MG PO TABS Oral Take 500 mg by mouth at bedtime.      . MELOXICAM 15 MG PO TABS Oral Take 15 mg by mouth daily.    . METHYLCELLULOSE (LAXATIVE) 500 MG PO TABS Oral Take 2 tablets by mouth daily after supper.    . CENTRUM SILVER PO Oral Take 1 tablet by mouth daily.     [name] FISH OIL 1200 MG PO CAPS Oral Take 1 capsule by mouth 2 (two) times daily.    [name] PRAMIPEXOLE DIHYDROCHLORIDE 0.5 MG PO TABS  TAKE ONE TABLET BY MOUTH AT BEDTIME 30 tablet 11  . SELENIUM 200 MCG PO CAPS Oral Take 200 mcg by mouth daily.    [name] SITAGLIPTIN PHOSPHATE 100 MG PO TABS Oral Take 100 mg by mouth daily.    [name] ESTRADIOL 2 MG VA RING Vaginal Place 2 mg vaginally every 3 (three) months. follow package directions     . HYDROCODONE-ACETAMINOPHEN 5-325 MG PO TABS Oral Take 2 tablets by mouth every 4 (four) hours as needed for pain. 20 tablet 0  . MELOXICAM 15 MG PO TABS Oral Take 1 tablet (15 mg total) by mouth daily. 14 tablet 0    BP 133/90  Pulse 86  Temp 99.1 F (37.3 C)  Resp 18  SpO2 97%  Physical Exam  Nursing note and vitals reviewed. Constitutional: She is oriented to person, place, and time. She appears well-developed and well-nourished. No distress.  HENT:  Head: Normocephalic and atraumatic.  Eyes: Conjunctivae and EOM are normal.  Neck: Normal range of motion.  Cardiovascular: Normal rate.   Pulmonary/Chest: Effort normal.  Abdominal: She exhibits no distension.  Musculoskeletal: Normal range of motion.       Extensive soft tissue swelling medial left ankle, tenderness posterior and inferior to the medial malleolus. No bruising. Distal fibula NT, Medial malleolus NT,  Deltoid ligament NT, Lateral ligaments NT, Achilles NT, Proximal fibula NT, Proximal 5th metatarsal NT, Midfoot NT, distal NVI with baseline sensation / motor to foot intact with CR<2 seconds. Bilateral flatfoot L>R   Neurological: She is alert and oriented to person, place, and time.   Skin: Skin is warm and dry.  Psychiatric: She has a normal mood and affect. Her behavior is normal. Judgment and thought content normal.  ED Course  Procedures (including critical care time)  Labs Reviewed - No data to display Dg Ankle Complete Left  10/30/2011  *RADIOLOGY REPORT*  Clinical Data: Medial left ankle pain and swelling over the past week.  No known injury.  History of breast cancer.  LEFT ANKLE COMPLETE - 3+ VIEW  Comparison: None.  Findings: Marked medial soft tissue swelling.  Well corticated ossific fragment in the soft tissues adjacent to the navicular bone on the oblique image, either an accessory ossicle or a chronic ununited avulsion fragment.  No evidence of acute fracture or dislocation.  Ankle mortise intact with joint space narrowing. Hypertrophic spurring involving the anterior and posterior aspect of the distal tibia at the ankle joint.  Moderate sized plantar calcaneal spur.  Enthesophyte at the insertion of the Achilles tendon.  IMPRESSION: No acute osseous abnormality.  Medial soft tissue swelling. Moderate degenerative changes.  Moderate sized plantar calcaneal spur.  Original Report Authenticated By: Arnell Sieving, M.D.     1. Ankle pain, left     No information on file.   MDM  X-ray reviewed. Osteophyte medial  ankle. No fracture. Full report per radiology  Patient's ankles roll inward, has poor mechanics. She changed shoes recently, but don't have a whole lot of support in them. Suspect repetitive injury. Place in ASO, and will have her start a cane or walker, since the boot hurts too much, but she needs to have some support. She'll followup with Dr. Lestine Box, her orthopedic surgeon. Instructed meloxicam once a day, ice, elevate. Tylenol as well. Norco only for severe pain. caution patient that this will increase her risk for fall. They agree with plan.      Luiz Blare, MD 10/31/11 1126

## 2011-10-30 NOTE — ED Notes (Signed)
Per pt c/o left ankle pain x approx one week progressively worse no known injury -  Has had same type of pain last year seen by ortho dr bednar - boot and physical therapy resolved

## 2011-11-02 DIAGNOSIS — IMO0002 Reserved for concepts with insufficient information to code with codable children: Secondary | ICD-10-CM | POA: Diagnosis not present

## 2011-11-02 DIAGNOSIS — M1710 Unilateral primary osteoarthritis, unspecified knee: Secondary | ICD-10-CM | POA: Diagnosis not present

## 2011-11-04 DIAGNOSIS — N905 Atrophy of vulva: Secondary | ICD-10-CM | POA: Diagnosis not present

## 2011-11-05 ENCOUNTER — Encounter: Payer: Self-pay | Admitting: Internal Medicine

## 2011-11-05 ENCOUNTER — Ambulatory Visit (INDEPENDENT_AMBULATORY_CARE_PROVIDER_SITE_OTHER): Payer: Medicare Other | Admitting: Internal Medicine

## 2011-11-05 VITALS — BP 140/76 | HR 80 | Temp 98.2°F | Resp 16 | Ht 72.0 in | Wt 247.0 lb

## 2011-11-05 DIAGNOSIS — R5381 Other malaise: Secondary | ICD-10-CM

## 2011-11-05 DIAGNOSIS — R5383 Other fatigue: Secondary | ICD-10-CM | POA: Diagnosis not present

## 2011-11-05 DIAGNOSIS — I10 Essential (primary) hypertension: Secondary | ICD-10-CM

## 2011-11-05 DIAGNOSIS — R635 Abnormal weight gain: Secondary | ICD-10-CM | POA: Diagnosis not present

## 2011-11-05 NOTE — Patient Instructions (Signed)
The patient is instructed to continue all medications as prescribed. Schedule followup with check out clerk upon leaving the clinic  

## 2011-11-05 NOTE — Progress Notes (Signed)
Subjective:    Patient ID: [name], female    DOB: October 28, 1940, 71 y.o.   MRN: [id number]  HPI Patient is a 71 year old female who is followed for hypertension diabetes and osteoarthritis.  She also has mild to moderate hyperlipidemia. The syndrome of diabetes hyperlipidemia and hypertension described syndrome attacks.  She has struggled with her weight and her obesity is a primary contributor to all 3 diagnoses.     Review of Systems  Constitutional: Negative for activity change, appetite change and fatigue.  HENT: Negative for ear pain, congestion, neck pain, postnasal drip and sinus pressure.   Eyes: Negative for redness and visual disturbance.  Respiratory: Negative for cough, shortness of breath and wheezing.   Gastrointestinal: Negative for abdominal pain and abdominal distention.  Genitourinary: Negative for dysuria, frequency and menstrual problem.  Musculoskeletal: Negative for myalgias, joint swelling and arthralgias.  Skin: Negative for rash and wound.  Neurological: Negative for dizziness, weakness and headaches.  Hematological: Negative for adenopathy. Does not bruise/bleed easily.  Psychiatric/Behavioral: Negative for disturbed wake/sleep cycle and decreased concentration.   Past Medical History  Diagnosis Date  . Hx of breast cancer   . Osteoporosis   . Shingles   . Diabetes mellitus   . Neuropathy   . AC (acromioclavicular) joint bone spurs   . Diverticulitis   . Heartburn   . Hypertension   . Arthritis   . Headache     Migraines  . Hammer toe   . Skin ulcer     On end of left foot, second toe    History   Social History  . Marital Status: Married    Spouse Name: N/A    Number of Children: N/A  . Years of Education: N/A   Occupational History  . Not on file.   Social History Main Topics  . Smoking status: Never Smoker   . Smokeless tobacco: Not on file  . Alcohol Use: No  . Drug Use: No  . Sexually Active: Not Currently   Other  Topics Concern  . Not on file   Social History Narrative  . No narrative on file    Past Surgical History  Procedure Date  . Cholecystectomy 06/2008  . Tonsillectomy   . Thyroidectomy   . Mastectomy 1994, 1995  . Laparoscopic oophorectomy   . Laparotomy   . Diagnostic laparoscopy     Family History  Problem Relation Age of Onset  . Arthritis Mother   . COPD Father   . Heart disease Father   . Hypertension Father   . Hyperlipidemia Father     Allergies  Allergen Reactions  . Cephalexin Other (See Comments)    Patient can't remember reaction  . Erythromycin Ethylsuccinate Hives and Diarrhea  . Nitrofurantoin Other (See Comments)    Severe headache    Current Outpatient Prescriptions on File Prior to Visit  Medication Sig Dispense Refill  . Alpha-Lipoic Acid 600 MG CAPS Take 1 capsule by mouth daily.      [name] APAP-Isometheptene-Caffeine (PRODRIN) 500-130-20 MG TABS Take 1 tablet by mouth as needed. For migraine       . aspirin 81 MG tablet Take 81 mg by mouth. Daily Monday through Saturday      . atenolol (TENORMIN) 25 MG tablet Take 1 tablet (25 mg total) by mouth daily.  90 tablet  3  . bimatoprost (LUMIGAN) 0.03 % ophthalmic drops Place 1 drop into both eyes at bedtime.        [name]  Bioflavonoid Products (ESTER-C) 500-550 MG TABS Take 1 capsule by mouth daily.       . brinzolamide (AZOPT) 1 % ophthalmic suspension Place 1 drop into both eyes 2 (two) times daily.      . Calcium-Vitamin D (POSTURE-D PO) Take 1 tablet by mouth 2 (two) times daily.      . cholecalciferol (VITAMIN D) 1000 UNITS tablet Take 1,000 Units by mouth daily.      [name] co-enzyme Q-10 50 MG capsule Take 150 mg by mouth daily.      . cyanocobalamin (,VITAMIN B-12,) 1000 MCG/ML injection Inject 1,000 mcg into the muscle every 21 ( twenty-one) days. Last dose 1/25      . estradiol (ESTRING) 2 MG vaginal ring Place 2 mg vaginally every 3 (three) months. follow package directions       . Eszopiclone 3 MG TABS  Take 1 tablet (3 mg total) by mouth at bedtime. Take immediately before bedtime  30 tablet  2  . Flaxseed, Linseed, (FLAX SEED OIL) 1000 MG CAPS Take 1 capsule by mouth daily.       [name] FLUoxetine (PROZAC) 20 MG capsule TAKE ONE CAPSULE BY MOUTH EVERY DAY  30 capsule  6  . glimepiride (AMARYL) 4 MG tablet TAKE ONE TABLET BY MOUTH EVERY DAY  30 tablet  11  . Grape Seed Extract 100 MG CAPS Take 100 mg by mouth daily.       [name] HYDROcodone-acetaminophen (NORCO) 5-325 MG per tablet Take 2 tablets by mouth every 4 (four) hours as needed for pain.  20 tablet  0  . magnesium gluconate (MAGONATE) 500 MG tablet Take 500 mg by mouth at bedtime.        . meloxicam (MOBIC) 15 MG tablet Take 1 tablet (15 mg total) by mouth daily.  14 tablet  0  . Methylcellulose, Laxative, (CITRUCEL) 500 MG TABS Take 2 tablets by mouth daily after supper.      . Multiple Vitamins-Minerals (CENTRUM SILVER PO) Take 1 tablet by mouth daily.       . Omega-3 Fatty Acids (FISH OIL) 1200 MG CAPS Take 1 capsule by mouth 2 (two) times daily.      . pramipexole (MIRAPEX) 0.5 MG tablet TAKE ONE TABLET BY MOUTH AT BEDTIME  30 tablet  11  . Selenium 200 MCG CAPS Take 200 mcg by mouth daily.      . sitaGLIPtin (JANUVIA) 100 MG tablet Take 100 mg by mouth daily.       Current Facility-Administered Medications on File Prior to Visit  Medication Dose Route Frequency Provider Last Rate Last Dose  . cyanocobalamin ((VITAMIN B-12)) injection 1,000 mcg  1,000 mcg Intramuscular Q30 days Stacie Glaze, MD      . cyanocobalamin ((VITAMIN B-12)) injection 1,500 mcg  1,500 mcg Intramuscular Q30 days Stacie Glaze, MD   1,500 mcg at 09/30/11 1003    BP 146/74  Pulse 80  Temp 98.2 F (36.8 C)  Resp 16  Ht 6' (1.829 m)  Wt 247 lb (112.038 kg)  BMI 33.50 kg/m2       Objective:   Physical Exam  Nursing note and vitals reviewed. Constitutional: She is oriented to person, place, and time. She appears well-developed and well-nourished. No  distress.  HENT:  Head: Normocephalic and atraumatic.  Right Ear: External ear normal.  Left Ear: External ear normal.  Nose: Nose normal.  Mouth/Throat: Oropharynx is clear and moist.  Eyes: Conjunctivae and EOM are normal. Pupils are  equal, round, and reactive to light.  Neck: Normal range of motion. Neck supple. No JVD present. No tracheal deviation present. No thyromegaly present.  Cardiovascular: Normal rate, regular rhythm, normal heart sounds and intact distal pulses.   No murmur heard. Pulmonary/Chest: Effort normal and breath sounds normal. She has no wheezes. She exhibits no tenderness.  Abdominal: Soft. Bowel sounds are normal.  Musculoskeletal: Normal range of motion. She exhibits no edema and no tenderness.  Lymphadenopathy:    She has no cervical adenopathy.  Neurological: She is alert and oriented to person, place, and time. She has normal reflexes. No cranial nerve deficit.  Skin: Skin is warm and dry. She is not diaphoretic.  Psychiatric: She has a normal mood and affect. Her behavior is normal.          Assessment & Plan:  We discussed her diabetes and the with worsened control but she has several blood sugars as well as elevated triglycerides.  Weight loss and diet are the key interventions to controlling her diabetes as well as controlling her triglycerides.  She has musculoskeletal issues including chronic joint pain that is interfered with walking we discussed going to the [facility] and participating in water classes as a alternative exercise therapy for her.  We reviewed a gluten-free diet

## 2011-11-10 ENCOUNTER — Ambulatory Visit (INDEPENDENT_AMBULATORY_CARE_PROVIDER_SITE_OTHER): Payer: Medicare Other | Admitting: Internal Medicine

## 2011-11-10 DIAGNOSIS — E538 Deficiency of other specified B group vitamins: Secondary | ICD-10-CM

## 2011-11-10 MED ORDER — CYANOCOBALAMIN 1000 MCG/ML IJ SOLN
1000.0000 ug | Freq: Once | INTRAMUSCULAR | Status: DC
  Administered 2011-11-10: 1500 ug via INTRAMUSCULAR

## 2011-11-25 DIAGNOSIS — H409 Unspecified glaucoma: Secondary | ICD-10-CM | POA: Diagnosis not present

## 2011-11-25 DIAGNOSIS — H4011X0 Primary open-angle glaucoma, stage unspecified: Secondary | ICD-10-CM | POA: Diagnosis not present

## 2011-12-01 ENCOUNTER — Telehealth: Payer: Self-pay | Admitting: Internal Medicine

## 2011-12-01 ENCOUNTER — Other Ambulatory Visit: Payer: Self-pay | Admitting: Internal Medicine

## 2011-12-01 NOTE — Telephone Encounter (Signed)
Pt states she was here for an office visit and we had no samples of Januvia. Pt requesting samples. Please contact

## 2011-12-01 NOTE — Telephone Encounter (Signed)
Non avaiable and pt informed - offered to call in and she refused

## 2011-12-02 ENCOUNTER — Ambulatory Visit (INDEPENDENT_AMBULATORY_CARE_PROVIDER_SITE_OTHER): Payer: Medicare Other | Admitting: Internal Medicine

## 2011-12-02 DIAGNOSIS — D519 Vitamin B12 deficiency anemia, unspecified: Secondary | ICD-10-CM

## 2011-12-02 DIAGNOSIS — D518 Other vitamin B12 deficiency anemias: Secondary | ICD-10-CM | POA: Diagnosis not present

## 2011-12-02 MED ORDER — CYANOCOBALAMIN 1000 MCG/ML IJ SOLN
1000.0000 ug | INTRAMUSCULAR | Status: DC
  Administered 2011-12-02: 1000 ug via INTRAMUSCULAR

## 2011-12-06 ENCOUNTER — Other Ambulatory Visit: Payer: Self-pay | Admitting: *Deleted

## 2011-12-06 MED ORDER — ESZOPICLONE 3 MG PO TABS: 3.0000 mg | ORAL_TABLET | Freq: Every day | ORAL | Status: DC

## 2011-12-20 DIAGNOSIS — M76829 Posterior tibial tendinitis, unspecified leg: Secondary | ICD-10-CM | POA: Diagnosis not present

## 2011-12-25 DIAGNOSIS — X58XXXA Exposure to other specified factors, initial encounter: Secondary | ICD-10-CM | POA: Diagnosis not present

## 2011-12-25 DIAGNOSIS — W010XXA Fall on same level from slipping, tripping and stumbling without subsequent striking against object, initial encounter: Secondary | ICD-10-CM | POA: Diagnosis not present

## 2011-12-25 DIAGNOSIS — M549 Dorsalgia, unspecified: Secondary | ICD-10-CM | POA: Diagnosis not present

## 2011-12-25 DIAGNOSIS — S42213A Unspecified displaced fracture of surgical neck of unspecified humerus, initial encounter for closed fracture: Secondary | ICD-10-CM | POA: Diagnosis not present

## 2011-12-25 DIAGNOSIS — R296 Repeated falls: Secondary | ICD-10-CM | POA: Diagnosis not present

## 2011-12-25 DIAGNOSIS — Z9010 Acquired absence of unspecified breast and nipple: Secondary | ICD-10-CM | POA: Diagnosis not present

## 2011-12-25 DIAGNOSIS — S42293A Other displaced fracture of upper end of unspecified humerus, initial encounter for closed fracture: Secondary | ICD-10-CM | POA: Diagnosis not present

## 2011-12-27 ENCOUNTER — Ambulatory Visit
Admission: RE | Admit: 2011-12-27 | Discharge: 2011-12-27 | Disposition: A | Discharge status: Home / Self Care | Payer: Medicare Other | Source: Ambulatory Visit | Attending: Orthopedic Surgery | Admitting: Orthopedic Surgery

## 2011-12-27 ENCOUNTER — Other Ambulatory Visit: Payer: Self-pay | Admitting: Orthopedic Surgery

## 2011-12-27 DIAGNOSIS — S42293A Other displaced fracture of upper end of unspecified humerus, initial encounter for closed fracture: Secondary | ICD-10-CM | POA: Diagnosis not present

## 2011-12-27 DIAGNOSIS — S42213A Unspecified displaced fracture of surgical neck of unspecified humerus, initial encounter for closed fracture: Secondary | ICD-10-CM | POA: Diagnosis not present

## 2011-12-30 ENCOUNTER — Encounter (HOSPITAL_COMMUNITY): Payer: Self-pay | Admitting: Pharmacy Technician

## 2011-12-31 ENCOUNTER — Encounter (HOSPITAL_COMMUNITY)
Admission: RE | Admit: 2011-12-31 | Discharge: 2011-12-31 | Disposition: A | Discharge status: Home / Self Care | Payer: Medicare Other | Source: Ambulatory Visit | Attending: Orthopedic Surgery | Admitting: Orthopedic Surgery

## 2011-12-31 ENCOUNTER — Ambulatory Visit (HOSPITAL_COMMUNITY)
Admission: RE | Admit: 2011-12-31 | Discharge: 2011-12-31 | Disposition: A | Discharge status: Home / Self Care | Payer: Medicare Other | Source: Ambulatory Visit | Attending: Orthopedic Surgery | Admitting: Orthopedic Surgery

## 2011-12-31 ENCOUNTER — Other Ambulatory Visit: Payer: Self-pay | Admitting: Orthopedic Surgery

## 2011-12-31 ENCOUNTER — Encounter (HOSPITAL_COMMUNITY): Payer: Self-pay

## 2011-12-31 ENCOUNTER — Other Ambulatory Visit (HOSPITAL_COMMUNITY): Payer: Self-pay | Admitting: Orthopedic Surgery

## 2011-12-31 DIAGNOSIS — Z01811 Encounter for preprocedural respiratory examination: Secondary | ICD-10-CM | POA: Diagnosis not present

## 2011-12-31 DIAGNOSIS — E119 Type 2 diabetes mellitus without complications: Secondary | ICD-10-CM | POA: Insufficient documentation

## 2011-12-31 DIAGNOSIS — M4120 Other idiopathic scoliosis, site unspecified: Secondary | ICD-10-CM | POA: Insufficient documentation

## 2011-12-31 DIAGNOSIS — I10 Essential (primary) hypertension: Secondary | ICD-10-CM | POA: Insufficient documentation

## 2011-12-31 DIAGNOSIS — Z01818 Encounter for other preprocedural examination: Secondary | ICD-10-CM | POA: Insufficient documentation

## 2011-12-31 DIAGNOSIS — Z853 Personal history of malignant neoplasm of breast: Secondary | ICD-10-CM | POA: Insufficient documentation

## 2011-12-31 DIAGNOSIS — J9819 Other pulmonary collapse: Secondary | ICD-10-CM | POA: Insufficient documentation

## 2011-12-31 DIAGNOSIS — Z01812 Encounter for preprocedural laboratory examination: Secondary | ICD-10-CM | POA: Insufficient documentation

## 2011-12-31 DIAGNOSIS — I700 Atherosclerosis of aorta: Secondary | ICD-10-CM | POA: Insufficient documentation

## 2011-12-31 HISTORY — DX: Nausea with vomiting, unspecified: R11.2

## 2011-12-31 HISTORY — DX: Vitamin D deficiency, unspecified: E55.9

## 2011-12-31 HISTORY — DX: Irritable bowel syndrome, unspecified: K58.9

## 2011-12-31 HISTORY — DX: Unspecified cataract: H26.9

## 2011-12-31 HISTORY — DX: Malignant (primary) neoplasm, unspecified: C80.1

## 2011-12-31 HISTORY — DX: Scoliosis, unspecified: M41.9

## 2011-12-31 HISTORY — DX: Pain in unspecified joint: M25.50

## 2011-12-31 HISTORY — DX: Other specified postprocedural states: Z98.890

## 2011-12-31 HISTORY — DX: Unspecified glaucoma: H40.9

## 2011-12-31 LAB — BASIC METABOLIC PANEL
CO2: 31 mEq/L (ref 19–32)
Chloride: 97 mEq/L (ref 96–112)
Creatinine, Ser: 0.67 mg/dL (ref 0.50–1.10)
GFR calc Af Amer: >90 mL/min (ref >90)
Potassium: 4.2 mEq/L (ref 3.5–5.1)

## 2011-12-31 LAB — CBC
HCT: 35.4 % — ABNORMAL LOW (ref 36.0–46.0)
Hemoglobin: 11.5 g/dL — ABNORMAL LOW (ref 12.0–15.0)
MCV: 90.8 fL (ref 78.0–100.0)
RBC: 3.9 MIL/uL (ref 3.87–5.11)
RDW: 14 % (ref 11.5–15.5)
WBC: 9.6 10*3/uL (ref 4.0–10.5)

## 2011-12-31 LAB — TYPE AND SCREEN: Antibody Screen: NEGATIVE

## 2011-12-31 LAB — ABO/RH: ABO/RH(D): O POS

## 2011-12-31 LAB — SURGICAL PCR SCREEN
MRSA, PCR: NEGATIVE
Staphylococcus aureus: NEGATIVE

## 2011-12-31 MED ORDER — CHLORHEXIDINE GLUCONATE 4 % EX LIQD: 60.0000 mL | Freq: Once | CUTANEOUS | Status: DC

## 2011-12-31 MED ORDER — VANCOMYCIN HCL 1000 MG IV SOLR
1500.0000 mg | INTRAVENOUS | Status: AC
  Administered 2012-01-01: 1500 mg via INTRAVENOUS
  Filled 2011-12-31 (×2): qty 1500

## 2011-12-31 NOTE — Progress Notes (Signed)
Saw Dr.Tennant 8-71yrs ago-stress test was done that was normal;pt states they were done d/t chest pain but found it was indigestion  Denies ever having an echo or heart cath  Medical Dr.John Jenkins  EKG in epic from 06/2011 CXR done >36yr ago

## 2011-12-31 NOTE — Progress Notes (Signed)
Sleep study in epic from 2008

## 2011-12-31 NOTE — Pre-Procedure Instructions (Signed)
20 [name]  12/31/2011   Your procedure is scheduled on:  Sat, Aug 24 @ 7:30 AM  Report to [facility] at 6:00 AM.  Call this number if you have problems the morning of surgery: [phone]   Remember:   Take these medicines the morning of surgery with A SIP OF WATER: Atenolol(Tenormin),Eye Drops,Prozac(Fluoxetine),and Pain Pill(if needed)   Do not wear jewelry, make-up or nail polish.  Do not wear lotions, powders, or perfumes.   Do not shave 48 hours prior to surgery.  Do not bring valuables to the hospital.  Contacts, dentures or bridgework may not be worn into surgery.  Leave suitcase in the car. After surgery it may be brought to your room.  For patients admitted to the hospital, checkout time is 11:00 AM the day of discharge.   Patients discharged the day of surgery will not be allowed to drive home.    Special Instructions: CHG Shower Use Special Wash: 1/2 bottle night before surgery and 1/2 bottle morning of surgery.   Please read over the following fact sheets that you were given: Pain Booklet, Coughing and Deep Breathing, Blood Transfusion Information, MRSA Information and Surgical Site Infection Prevention

## 2011-12-31 NOTE — Progress Notes (Signed)
Notified dr.ossey that xray tech couldn't get a 2view bc pt couldn't tolerate it;1view will be acceptable

## 2012-01-01 ENCOUNTER — Encounter (HOSPITAL_COMMUNITY): Payer: Self-pay | Admitting: *Deleted

## 2012-01-01 ENCOUNTER — Encounter (HOSPITAL_COMMUNITY): Payer: Self-pay | Admitting: Anesthesiology

## 2012-01-01 ENCOUNTER — Inpatient Hospital Stay (HOSPITAL_COMMUNITY)
Admission: RE | Admit: 2012-01-01 | Discharge: 2012-01-05 | DRG: 483 | Disposition: A | Discharge status: Home Health | Payer: Medicare Other | Source: Ambulatory Visit | Attending: Orthopedic Surgery | Admitting: Orthopedic Surgery

## 2012-01-01 ENCOUNTER — Ambulatory Visit (HOSPITAL_COMMUNITY): Payer: Medicare Other | Admitting: Anesthesiology

## 2012-01-01 ENCOUNTER — Encounter (HOSPITAL_COMMUNITY)
Admission: RE | Disposition: A | Discharge status: Home Health | Payer: Self-pay | Source: Ambulatory Visit | Attending: Orthopedic Surgery

## 2012-01-01 DIAGNOSIS — N951 Menopausal and female climacteric states: Secondary | ICD-10-CM | POA: Diagnosis present

## 2012-01-01 DIAGNOSIS — D62 Acute posthemorrhagic anemia: Secondary | ICD-10-CM | POA: Diagnosis not present

## 2012-01-01 DIAGNOSIS — D518 Other vitamin B12 deficiency anemias: Secondary | ICD-10-CM | POA: Diagnosis present

## 2012-01-01 DIAGNOSIS — G4700 Insomnia, unspecified: Secondary | ICD-10-CM | POA: Diagnosis present

## 2012-01-01 DIAGNOSIS — Z7982 Long term (current) use of aspirin: Secondary | ICD-10-CM

## 2012-01-01 DIAGNOSIS — W1809XA Striking against other object with subsequent fall, initial encounter: Secondary | ICD-10-CM | POA: Diagnosis present

## 2012-01-01 DIAGNOSIS — Z471 Aftercare following joint replacement surgery: Secondary | ICD-10-CM | POA: Diagnosis not present

## 2012-01-01 DIAGNOSIS — Z79899 Other long term (current) drug therapy: Secondary | ICD-10-CM

## 2012-01-01 DIAGNOSIS — Z8249 Family history of ischemic heart disease and other diseases of the circulatory system: Secondary | ICD-10-CM | POA: Diagnosis not present

## 2012-01-01 DIAGNOSIS — M25519 Pain in unspecified shoulder: Secondary | ICD-10-CM | POA: Diagnosis not present

## 2012-01-01 DIAGNOSIS — G2581 Restless legs syndrome: Secondary | ICD-10-CM | POA: Diagnosis present

## 2012-01-01 DIAGNOSIS — Z888 Allergy status to other drugs, medicaments and biological substances status: Secondary | ICD-10-CM | POA: Diagnosis not present

## 2012-01-01 DIAGNOSIS — G8918 Other acute postprocedural pain: Secondary | ICD-10-CM | POA: Diagnosis not present

## 2012-01-01 DIAGNOSIS — G609 Hereditary and idiopathic neuropathy, unspecified: Secondary | ICD-10-CM | POA: Diagnosis present

## 2012-01-01 DIAGNOSIS — Z881 Allergy status to other antibiotic agents status: Secondary | ICD-10-CM

## 2012-01-01 DIAGNOSIS — M659 Synovitis and tenosynovitis, unspecified: Secondary | ICD-10-CM | POA: Diagnosis present

## 2012-01-01 DIAGNOSIS — K219 Gastro-esophageal reflux disease without esophagitis: Secondary | ICD-10-CM | POA: Diagnosis present

## 2012-01-01 DIAGNOSIS — I10 Essential (primary) hypertension: Secondary | ICD-10-CM | POA: Diagnosis present

## 2012-01-01 DIAGNOSIS — Z853 Personal history of malignant neoplasm of breast: Secondary | ICD-10-CM

## 2012-01-01 DIAGNOSIS — K7689 Other specified diseases of liver: Secondary | ICD-10-CM | POA: Diagnosis present

## 2012-01-01 DIAGNOSIS — E119 Type 2 diabetes mellitus without complications: Secondary | ICD-10-CM | POA: Diagnosis not present

## 2012-01-01 DIAGNOSIS — Z8261 Family history of arthritis: Secondary | ICD-10-CM

## 2012-01-01 DIAGNOSIS — M810 Age-related osteoporosis without current pathological fracture: Secondary | ICD-10-CM | POA: Diagnosis present

## 2012-01-01 DIAGNOSIS — S42213A Unspecified displaced fracture of surgical neck of unspecified humerus, initial encounter for closed fracture: Secondary | ICD-10-CM | POA: Diagnosis not present

## 2012-01-01 DIAGNOSIS — Z836 Family history of other diseases of the respiratory system: Secondary | ICD-10-CM | POA: Diagnosis not present

## 2012-01-01 DIAGNOSIS — Z23 Encounter for immunization: Secondary | ICD-10-CM

## 2012-01-01 DIAGNOSIS — M65979 Unspecified synovitis and tenosynovitis, unspecified ankle and foot: Secondary | ICD-10-CM | POA: Diagnosis present

## 2012-01-01 DIAGNOSIS — E785 Hyperlipidemia, unspecified: Secondary | ICD-10-CM | POA: Diagnosis present

## 2012-01-01 DIAGNOSIS — Z9010 Acquired absence of unspecified breast and nipple: Secondary | ICD-10-CM | POA: Diagnosis not present

## 2012-01-01 DIAGNOSIS — S42209A Unspecified fracture of upper end of unspecified humerus, initial encounter for closed fracture: Secondary | ICD-10-CM | POA: Diagnosis not present

## 2012-01-01 DIAGNOSIS — S42293A Other displaced fracture of upper end of unspecified humerus, initial encounter for closed fracture: Secondary | ICD-10-CM | POA: Diagnosis not present

## 2012-01-01 DIAGNOSIS — Z96619 Presence of unspecified artificial shoulder joint: Secondary | ICD-10-CM | POA: Diagnosis not present

## 2012-01-01 DIAGNOSIS — Z01812 Encounter for preprocedural laboratory examination: Secondary | ICD-10-CM | POA: Diagnosis not present

## 2012-01-01 HISTORY — PX: SHOULDER HEMI-ARTHROPLASTY: SHX5049

## 2012-01-01 LAB — GLUCOSE, CAPILLARY
Glucose-Capillary: 212 mg/dL — ABNORMAL HIGH (ref 70–99)
Glucose-Capillary: 171 mg/dL — ABNORMAL HIGH (ref 70–99)

## 2012-01-01 SURGERY — HEMIARTHROPLASTY, SHOULDER
Anesthesia: General | Site: Shoulder | Laterality: Left

## 2012-01-01 MED ORDER — MAGNESIUM GLUCONATE 500 MG PO TABS
500.0000 mg | ORAL_TABLET | Freq: Every day | ORAL | Status: DC
  Administered 2012-01-01 – 2012-01-04 (×4): 500 mg via ORAL
  Filled 2012-01-01 (×5): qty 1

## 2012-01-01 MED ORDER — PNEUMOCOCCAL VAC POLYVALENT 25 MCG/0.5ML IJ INJ
0.5000 mL | INJECTION | INTRAMUSCULAR | Status: AC
  Administered 2012-01-02: 0.5 mL via INTRAMUSCULAR
  Filled 2012-01-01: qty 0.5

## 2012-01-01 MED ORDER — BIMATOPROST 0.01 % OP SOLN
1.0000 [drp] | Freq: Every day | OPHTHALMIC | Status: DC
  Administered 2012-01-01 – 2012-01-04 (×4): 1 [drp] via OPHTHALMIC
  Filled 2012-01-01: qty 2.5

## 2012-01-01 MED ORDER — HYDROMORPHONE HCL PF 1 MG/ML IJ SOLN
INTRAMUSCULAR | Status: AC
  Administered 2012-01-01: 0.5 mg via INTRAVENOUS
  Filled 2012-01-01: qty 1

## 2012-01-01 MED ORDER — ALPRAZOLAM 0.5 MG PO TABS
0.5000 mg | ORAL_TABLET | Freq: Four times a day (QID) | ORAL | Status: DC | PRN
  Administered 2012-01-04 (×2): 0.5 mg via ORAL
  Filled 2012-01-01 (×2): qty 1

## 2012-01-01 MED ORDER — HYDROMORPHONE HCL 2 MG PO TABS
2.0000 mg | ORAL_TABLET | ORAL | Status: DC | PRN
  Administered 2012-01-01: 2 mg via ORAL
  Administered 2012-01-01: 4 mg via ORAL
  Administered 2012-01-01: 2 mg via ORAL
  Administered 2012-01-02: 4 mg via ORAL
  Filled 2012-01-01 (×2): qty 1
  Filled 2012-01-01 (×2): qty 2
  Filled 2012-01-01: qty 1

## 2012-01-01 MED ORDER — FENTANYL CITRATE 0.05 MG/ML IJ SOLN
INTRAMUSCULAR | Status: DC | PRN
  Administered 2012-01-01 (×2): 100 ug via INTRAVENOUS

## 2012-01-01 MED ORDER — BUPIVACAINE-EPINEPHRINE PF 0.25-1:200000 % IJ SOLN
INTRAMUSCULAR | Status: AC
  Filled 2012-01-01: qty 30

## 2012-01-01 MED ORDER — ONDANSETRON HCL 4 MG/2ML IJ SOLN
INTRAMUSCULAR | Status: DC | PRN
  Administered 2012-01-01: 4 mg via INTRAVENOUS

## 2012-01-01 MED ORDER — LINAGLIPTIN 5 MG PO TABS
5.0000 mg | ORAL_TABLET | Freq: Every day | ORAL | Status: DC
  Administered 2012-01-01 – 2012-01-05 (×5): 5 mg via ORAL
  Filled 2012-01-01 (×5): qty 1

## 2012-01-01 MED ORDER — ONDANSETRON HCL 4 MG PO TABS
4.0000 mg | ORAL_TABLET | Freq: Four times a day (QID) | ORAL | Status: DC | PRN
  Administered 2012-01-03: 4 mg via ORAL
  Filled 2012-01-01: qty 1

## 2012-01-01 MED ORDER — ONDANSETRON HCL 4 MG/2ML IJ SOLN: 4.0000 mg | Freq: Four times a day (QID) | INTRAMUSCULAR | Status: DC | PRN

## 2012-01-01 MED ORDER — ASPIRIN EC 325 MG PO TBEC
325.0000 mg | DELAYED_RELEASE_TABLET | Freq: Every day | ORAL | Status: DC
  Administered 2012-01-01 – 2012-01-05 (×5): 325 mg via ORAL
  Filled 2012-01-01 (×5): qty 1

## 2012-01-01 MED ORDER — ROCURONIUM BROMIDE 100 MG/10ML IV SOLN
INTRAVENOUS | Status: DC | PRN
Start: 2012-01-01 — End: 2012-01-01
  Administered 2012-01-01: 50 mg via INTRAVENOUS

## 2012-01-01 MED ORDER — VITAMIN C 500 MG PO TABS
1000.0000 mg | ORAL_TABLET | Freq: Every day | ORAL | Status: DC
  Administered 2012-01-01 – 2012-01-05 (×5): 1000 mg via ORAL
  Filled 2012-01-01 (×5): qty 2

## 2012-01-01 MED ORDER — BUPIVACAINE-EPINEPHRINE PF 0.5-1:200000 % IJ SOLN
INTRAMUSCULAR | Status: DC | PRN
  Administered 2012-01-01: 30 mL

## 2012-01-01 MED ORDER — SODIUM CHLORIDE 0.9 % IR SOLN
Status: DC | PRN
  Administered 2012-01-01: 3000 mL

## 2012-01-01 MED ORDER — HYDROMORPHONE HCL PF 1 MG/ML IJ SOLN: 0.2500 mg | INTRAMUSCULAR | Status: DC | PRN

## 2012-01-01 MED ORDER — FAMOTIDINE 20 MG PO TABS
20.0000 mg | ORAL_TABLET | Freq: Two times a day (BID) | ORAL | Status: DC | PRN
  Filled 2012-01-01: qty 1

## 2012-01-01 MED ORDER — PROPOFOL 10 MG/ML IV EMUL
INTRAVENOUS | Status: DC | PRN
  Administered 2012-01-01: 100 mg via INTRAVENOUS

## 2012-01-01 MED ORDER — LIDOCAINE HCL (CARDIAC) 20 MG/ML IV SOLN
INTRAVENOUS | Status: DC | PRN
  Administered 2012-01-01: 50 mg via INTRAVENOUS

## 2012-01-01 MED ORDER — DIPHENHYDRAMINE HCL 25 MG PO CAPS: 25.0000 mg | ORAL_CAPSULE | Freq: Four times a day (QID) | ORAL | Status: DC | PRN

## 2012-01-01 MED ORDER — MEPERIDINE HCL 25 MG/ML IJ SOLN: 6.2500 mg | INTRAMUSCULAR | Status: DC | PRN

## 2012-01-01 MED ORDER — INSULIN ASPART 100 UNIT/ML ~~LOC~~ SOLN
0.0000 [IU] | Freq: Three times a day (TID) | SUBCUTANEOUS | Status: DC
  Administered 2012-01-01 – 2012-01-03 (×7): 2 [IU] via SUBCUTANEOUS
  Administered 2012-01-04: 1 [IU] via SUBCUTANEOUS
  Administered 2012-01-04: 2 [IU] via SUBCUTANEOUS
  Administered 2012-01-04: 1 [IU] via SUBCUTANEOUS

## 2012-01-01 MED ORDER — BIMATOPROST 0.03 % OP SOLN: 1.0000 [drp] | Freq: Every day | OPHTHALMIC | Status: DC

## 2012-01-01 MED ORDER — NEOSTIGMINE METHYLSULFATE 1 MG/ML IJ SOLN
INTRAMUSCULAR | Status: DC | PRN
  Administered 2012-01-01: 2.5 mg via INTRAVENOUS

## 2012-01-01 MED ORDER — HYDROMORPHONE HCL PF 1 MG/ML IJ SOLN
0.5000 mg | INTRAMUSCULAR | Status: DC | PRN
  Administered 2012-01-01 (×3): 0.5 mg via INTRAVENOUS
  Administered 2012-01-02 (×5): 1 mg via INTRAVENOUS
  Filled 2012-01-01 (×7): qty 1

## 2012-01-01 MED ORDER — METHOCARBAMOL 500 MG PO TABS
500.0000 mg | ORAL_TABLET | Freq: Four times a day (QID) | ORAL | Status: DC | PRN
  Administered 2012-01-01 – 2012-01-05 (×7): 500 mg via ORAL
  Filled 2012-01-01 (×7): qty 1

## 2012-01-01 MED ORDER — TEMAZEPAM 15 MG PO CAPS: 15.0000 mg | ORAL_CAPSULE | Freq: Every evening | ORAL | Status: DC | PRN

## 2012-01-01 MED ORDER — SODIUM CHLORIDE 0.45 % IV SOLN: INTRAVENOUS | Status: DC

## 2012-01-01 MED ORDER — GLIMEPIRIDE 4 MG PO TABS
4.0000 mg | ORAL_TABLET | Freq: Every day | ORAL | Status: DC
  Administered 2012-01-02 – 2012-01-05 (×4): 4 mg via ORAL
  Filled 2012-01-01 (×5): qty 1

## 2012-01-01 MED ORDER — SENNA 8.6 MG PO TABS
1.0000 | ORAL_TABLET | Freq: Two times a day (BID) | ORAL | Status: DC
  Administered 2012-01-01 – 2012-01-05 (×8): 8.6 mg via ORAL
  Filled 2012-01-01 (×10): qty 1

## 2012-01-01 MED ORDER — ATENOLOL 25 MG PO TABS
25.0000 mg | ORAL_TABLET | Freq: Every day | ORAL | Status: DC
  Administered 2012-01-02 – 2012-01-05 (×4): 25 mg via ORAL
  Filled 2012-01-01 (×4): qty 1

## 2012-01-01 MED ORDER — VANCOMYCIN HCL 1000 MG IV SOLR
1500.0000 mg | INTRAVENOUS | Status: DC
  Administered 2012-01-02: 1500 mg via INTRAVENOUS
  Filled 2012-01-01 (×4): qty 1500

## 2012-01-01 MED ORDER — PHENYLEPHRINE HCL 10 MG/ML IJ SOLN
10.0000 mg | INTRAVENOUS | Status: DC | PRN
  Administered 2012-01-01: 25 ug/min via INTRAVENOUS

## 2012-01-01 MED ORDER — PROMETHAZINE HCL 25 MG RE SUPP: 12.5000 mg | Freq: Four times a day (QID) | RECTAL | Status: DC | PRN

## 2012-01-01 MED ORDER — 0.9 % SODIUM CHLORIDE (POUR BTL) OPTIME
TOPICAL | Status: DC | PRN
  Administered 2012-01-01: 1000 mL

## 2012-01-01 MED ORDER — LACTATED RINGERS IV SOLN
INTRAVENOUS | Status: DC | PRN
  Administered 2012-01-01 (×3): via INTRAVENOUS

## 2012-01-01 MED ORDER — FLUOXETINE HCL 20 MG PO CAPS
20.0000 mg | ORAL_CAPSULE | Freq: Every day | ORAL | Status: DC
  Administered 2012-01-02 – 2012-01-05 (×4): 20 mg via ORAL
  Filled 2012-01-01 (×4): qty 1

## 2012-01-01 MED ORDER — LACTATED RINGERS IV SOLN
INTRAVENOUS | Status: DC
  Administered 2012-01-01 – 2012-01-02 (×3): via INTRAVENOUS

## 2012-01-01 MED ORDER — VITAMIN D3 25 MCG (1000 UNIT) PO TABS
1000.0000 [IU] | ORAL_TABLET | Freq: Every day | ORAL | Status: DC
  Administered 2012-01-01 – 2012-01-05 (×5): 1000 [IU] via ORAL
  Filled 2012-01-01 (×5): qty 1

## 2012-01-01 MED ORDER — PHENYLEPHRINE HCL 10 MG/ML IJ SOLN
INTRAMUSCULAR | Status: DC | PRN
  Administered 2012-01-01: 40 ug via INTRAVENOUS
  Administered 2012-01-01 (×2): 80 ug via INTRAVENOUS
  Administered 2012-01-01 (×3): 40 ug via INTRAVENOUS
  Administered 2012-01-01: 80 ug via INTRAVENOUS

## 2012-01-01 MED ORDER — ASPIRIN 325 MG PO TABS: 325.0000 mg | ORAL_TABLET | ORAL | Status: DC

## 2012-01-01 MED ORDER — GLYCOPYRROLATE 0.2 MG/ML IJ SOLN
INTRAMUSCULAR | Status: DC | PRN
  Administered 2012-01-01: 0.4 mg via INTRAVENOUS

## 2012-01-01 MED ORDER — CIPROFLOXACIN HCL 500 MG PO TABS
500.0000 mg | ORAL_TABLET | Freq: Two times a day (BID) | ORAL | Status: DC
  Administered 2012-01-01 – 2012-01-05 (×8): 500 mg via ORAL
  Filled 2012-01-01 (×10): qty 1

## 2012-01-01 MED ORDER — METHOCARBAMOL 100 MG/ML IJ SOLN
500.0000 mg | Freq: Four times a day (QID) | INTRAVENOUS | Status: DC | PRN
  Administered 2012-01-02: 500 mg via INTRAVENOUS
  Filled 2012-01-01 (×2): qty 5

## 2012-01-01 MED ORDER — MIDAZOLAM HCL 5 MG/5ML IJ SOLN
INTRAMUSCULAR | Status: DC | PRN
  Administered 2012-01-01: 2 mg via INTRAVENOUS

## 2012-01-01 MED ORDER — BRINZOLAMIDE 1 % OP SUSP
1.0000 [drp] | Freq: Two times a day (BID) | OPHTHALMIC | Status: DC
  Administered 2012-01-01 – 2012-01-05 (×8): 1 [drp] via OPHTHALMIC
  Filled 2012-01-01 (×2): qty 10

## 2012-01-01 MED ORDER — ONDANSETRON HCL 4 MG/2ML IJ SOLN: 4.0000 mg | Freq: Once | INTRAMUSCULAR | Status: DC | PRN

## 2012-01-01 SURGICAL SUPPLY — 56 items
BENZOIN TINCTURE PRP APPL 2/3 (GAUZE/BANDAGES/DRESSINGS) IMPLANT
BLADE SAW SGTL 83.5X18.5 (BLADE) ×2 IMPLANT
BRUSH FEMORAL CANAL (MISCELLANEOUS) IMPLANT
CEMENT BONE DEPUY (Cement) ×4 IMPLANT
CEMENT RESTRICTOR DEPUY SZ 3 (Cement) ×2 IMPLANT
CHLORAPREP W/TINT 26ML (MISCELLANEOUS) ×4 IMPLANT
CLOTH BEACON ORANGE TIMEOUT ST (SAFETY) ×2 IMPLANT
CLSR STERI-STRIP ANTIMIC 1/2X4 (GAUZE/BANDAGES/DRESSINGS) ×2 IMPLANT
COVER SURGICAL LIGHT HANDLE (MISCELLANEOUS) ×2 IMPLANT
DRAPE C-ARM 42X72 X-RAY (DRAPES) ×2 IMPLANT
DRAPE INCISE IOBAN 66X45 STRL (DRAPES) ×4 IMPLANT
DRAPE OEC MINIVIEW 54X84 (DRAPES) ×2 IMPLANT
DRAPE SURG 17X11 SM STRL (DRAPES) IMPLANT
DRAPE SURG 17X23 STRL (DRAPES) ×2 IMPLANT
DRAPE U-SHAPE 47X51 STRL (DRAPES) ×2 IMPLANT
DRSG ADAPTIC 3X8 NADH LF (GAUZE/BANDAGES/DRESSINGS) IMPLANT
DRSG MEPILEX BORDER 4X12 (GAUZE/BANDAGES/DRESSINGS) ×2 IMPLANT
DRSG PAD ABDOMINAL 8X10 ST (GAUZE/BANDAGES/DRESSINGS) ×2 IMPLANT
DURAPREP 26ML APPLICATOR (WOUND CARE) IMPLANT
ELECT NEEDLE BLADE 2-5/6 (NEEDLE) ×2 IMPLANT
ELECT REM PT RETURN 9FT ADLT (ELECTROSURGICAL) ×2
ELECTRODE REM PT RTRN 9FT ADLT (ELECTROSURGICAL) ×1 IMPLANT
GLOVE BIOGEL M STRL SZ7.5 (GLOVE) ×2 IMPLANT
GLOVE SS BIOGEL STRL SZ 8 (GLOVE) ×3 IMPLANT
GLOVE SUPERSENSE BIOGEL SZ 8 (GLOVE) ×3
GOWN PREVENTION PLUS XLARGE (GOWN DISPOSABLE) ×4 IMPLANT
GOWN STRL NON-REIN LRG LVL3 (GOWN DISPOSABLE) IMPLANT
GOWN STRL REIN XL XLG (GOWN DISPOSABLE) ×2 IMPLANT
HANDPIECE INTERPULSE COAX TIP (DISPOSABLE) ×1
KIT BASIN OR (CUSTOM PROCEDURE TRAY) ×2 IMPLANT
KIT ROOM TURNOVER OR (KITS) ×2 IMPLANT
MANIFOLD NEPTUNE II (INSTRUMENTS) ×2 IMPLANT
NEEDLE HYPO 21X1.5 SAFETY (NEEDLE) IMPLANT
NS IRRIG 1000ML POUR BTL (IV SOLUTION) ×4 IMPLANT
PACK SHOULDER (CUSTOM PROCEDURE TRAY) ×2 IMPLANT
PAD ARMBOARD 7.5X6 YLW CONV (MISCELLANEOUS) ×4 IMPLANT
PASSER SUT SWANSON 36MM LOOP (INSTRUMENTS) ×2 IMPLANT
SET HNDPC FAN SPRY TIP SCT (DISPOSABLE) ×1 IMPLANT
SLING ARM IMMOBILIZER XL (CAST SUPPLIES) ×2 IMPLANT
SPONGE GAUZE 4X4 12PLY (GAUZE/BANDAGES/DRESSINGS) IMPLANT
SPONGE LAP 4X18 X RAY DECT (DISPOSABLE) ×4 IMPLANT
STRIP CLOSURE SKIN 1/2X4 (GAUZE/BANDAGES/DRESSINGS) ×2 IMPLANT
SUCTION FRAZIER TIP 10 FR DISP (SUCTIONS) ×2 IMPLANT
SUT BONE WAX W31G (SUTURE) IMPLANT
SUT FIBERWIRE #2 38 T-5 BLUE (SUTURE) ×26
SUT MNCRL AB 4-0 PS2 18 (SUTURE) IMPLANT
SUT PROLENE 3 0 PS 2 (SUTURE) ×4 IMPLANT
SUT VIC AB 0 CT1 27 (SUTURE) ×1
SUT VIC AB 0 CT1 27XBRD ANBCTR (SUTURE) ×1 IMPLANT
SUT VIC AB 2-0 CT1 36 (SUTURE) ×2 IMPLANT
SUT VIC AB 3-0 X1 27 (SUTURE) ×4 IMPLANT
SUTURE FIBERWR #2 38 T-5 BLUE (SUTURE) ×13 IMPLANT
TOWEL OR 17X24 6PK STRL BLUE (TOWEL DISPOSABLE) ×4 IMPLANT
TOWER CARTRIDGE SMART MIX (DISPOSABLE) ×2 IMPLANT
WATER STERILE IRR 1000ML POUR (IV SOLUTION) IMPLANT
YANKAUER SUCT BULB TIP NO VENT (SUCTIONS) ×2 IMPLANT

## 2012-01-01 NOTE — Anesthesia Preprocedure Evaluation (Addendum)
Anesthesia Evaluation  Patient identified by MRN, date of birth, ID band Patient awake    Reviewed: Allergy & Precautions, H&P , NPO status , Patient's Chart, lab work & pertinent test results, reviewed documented beta blocker date and time   History of Anesthesia Complications Negative for: history of anesthetic complications  Airway Mallampati: II TM Distance: >3 FB Neck ROM: Full    Dental  (+) Teeth Intact and Dental Advisory Given   Pulmonary pneumonia -, resolved,  Pt c/o coughing up green sputum. Not feeling bad. CXR clear. Chest clear to auscultation. Plan proceed. breath sounds clear to auscultation        Cardiovascular hypertension, Pt. on medications and Pt. on home beta blockers     Neuro/Psych  Headaches,  Neuromuscular disease    GI/Hepatic GERD-  Medicated,  Endo/Other  Oral Hypoglycemic Agents  Renal/GU      Musculoskeletal   Abdominal   Peds  Hematology   Anesthesia Other Findings   Reproductive/Obstetrics                          Anesthesia Physical Anesthesia Plan  ASA: III  Anesthesia Plan: General   Post-op Pain Management: MAC Combined w/ Regional for Post-op pain   Induction: Intravenous  Airway Management Planned: Oral ETT  Additional Equipment:   Intra-op Plan:   Post-operative Plan: Extubation in OR  Informed Consent: I have reviewed the patients History and Physical, chart, labs and discussed the procedure including the risks, benefits and alternatives for the proposed anesthesia with the patient or authorized representative who has indicated his/her understanding and acceptance.   Dental advisory given  Plan Discussed with: CRNA  Anesthesia Plan Comments:        Anesthesia Quick Evaluation

## 2012-01-01 NOTE — Progress Notes (Addendum)
ANTIBIOTIC CONSULT NOTE - INITIAL  Pharmacy Consult for vancomycin Indication: post op prophylaxis  Allergies  Allergen Reactions  . Cephalexin Other (See Comments)    Patient can't remember reaction  . Erythromycin Ethylsuccinate Hives and Diarrhea  . Nitrofurantoin Other (See Comments)    Severe headache  . Percocet (Oxycodone-Acetaminophen) Itching    Patient Measurements:   Wt 12/31/11: 111.1 kg  Vital Signs: Temp: 99.1 F (37.3 C) (08/24 1345) Temp src: Oral (08/24 1345) BP: 154/73 mmHg (08/24 1345) Pulse Rate: 107  (08/24 1345) Intake/Output from previous day:   Intake/Output from this shift: Total I/O In: 2000 [I.V.:2000] Out: 400 [Blood:400]  Labs:  [facility] 12/31/11 1453  WBC 9.6  HGB 11.5*  PLT 265  LABCREA --  CREATININE 0.67   The CrCl is unknown because both a height and weight (above a minimum accepted value) are required for this calculation. No results found for this basename: VANCOTROUGH:2,VANCOPEAK:2,VANCORANDOM:2,GENTTROUGH:2,GENTPEAK:2,GENTRANDOM:2,TOBRATROUGH:2,TOBRAPEAK:2,TOBRARND:2,AMIKACINPEAK:2,AMIKACINTROU:2,AMIKACIN:2, in the last 72 hours   Microbiology: Recent Results (from the past 720 hour(s))  SURGICAL PCR SCREEN     Status: Normal   Collection Time   12/31/11  2:53 PM      Component Value Range Status Comment   MRSA, PCR NEGATIVE  NEGATIVE Final    Staphylococcus aureus NEGATIVE  NEGATIVE Final     Medical History: Past Medical History  Diagnosis Date  . Hx of breast cancer   . Osteoporosis   . Shingles   . Neuropathy   . AC (acromioclavicular) joint bone spurs   . Diverticulitis   . Heartburn   . Headache     Migraines  . Hammer toe   . PONV (postoperative nausea and vomiting)   . Hypertension     takes Atenolol daily  . History of bronchitis   . Pneumonia     history of;many yrs ago  . History of migraine     last one about 6-68months ago;Prodrin prn  . Joint pain   . Arthritis     right knee;injections every  6months   . Scoliosis   . Sensitive skin     sees a dermatoligist yrly  . IBS (irritable bowel syndrome)   . Vitamin d deficiency     takes Vit d daily  . Diabetes mellitus     takes Amaryl and Januvia daily  . Early cataracts, bilateral   . Glaucoma   . Cancer     hx of breast ca    Medications:  Prescriptions prior to admission  Medication Sig Dispense Refill  . Alpha-Lipoic Acid 600 MG CAPS Take 1 capsule by mouth daily.      [name] aspirin 325 MG tablet Take 325 mg by mouth once a week. Sunday only      . aspirin 81 MG tablet Take 81 mg by mouth. Daily Monday through Saturday      . atenolol (TENORMIN) 25 MG tablet Take 1 tablet (25 mg total) by mouth daily.  90 tablet  3  . bimatoprost (LUMIGAN) 0.03 % ophthalmic drops Place 1 drop into both eyes at bedtime.        . brinzolamide (AZOPT) 1 % ophthalmic suspension Place 1 drop into both eyes 2 (two) times daily.      . cholecalciferol (VITAMIN D) 1000 UNITS tablet Take 1,000 Units by mouth daily.      [name] co-enzyme Q-10 50 MG capsule Take 150 mg by mouth daily.      . cyanocobalamin (,VITAMIN B-12,) 1000 MCG/ML injection Inject 1,000  mcg into the muscle every 21 ( twenty-one) days. Last dose 1/25      . estradiol (ESTRING) 2 MG vaginal ring Place 2 mg vaginally every 3 (three) months. follow package directions       . Eszopiclone (LUNESTA PO) Take 5 mg by mouth at bedtime. lunesta 5mg       . Flaxseed, Linseed, (FLAX SEED OIL) 1000 MG CAPS Take 1 capsule by mouth daily.       [name] FLUoxetine (PROZAC) 20 MG capsule TAKE ONE CAPSULE BY MOUTH EVERY DAY  30 capsule  6  . glimepiride (AMARYL) 4 MG tablet TAKE ONE TABLET BY MOUTH EVERY DAY  30 tablet  11  . Grape Seed Extract 100 MG CAPS Take 150 mg by mouth daily.       [name] HYDROmorphone (DILAUDID) 2 MG tablet Take 2-4 mg by mouth every 4 (four) hours as needed. For pain      . magnesium gluconate (MAGONATE) 500 MG tablet Take 500 mg by mouth at bedtime.        . meloxicam (MOBIC) 15 MG tablet  Take 1 tablet (15 mg total) by mouth daily.  14 tablet  0  . Methylcellulose, Laxative, (CITRUCEL) 500 MG TABS Take 2 tablets by mouth daily after supper.      . Multiple Vitamins-Minerals (CENTRUM SILVER PO) Take 1 tablet by mouth daily.       . Omega-3 Fatty Acids (FISH OIL) 1200 MG CAPS Take 1 capsule by mouth 2 (two) times daily.      . pramipexole (MIRAPEX) 0.5 MG tablet TAKE ONE TABLET BY MOUTH AT BEDTIME  30 tablet  11  . Selenium 200 MCG CAPS Take 200 mcg by mouth daily.      . sitaGLIPtin (JANUVIA) 100 MG tablet Take 100 mg by mouth daily.      . Vitamin Mixture (ESTER-C) 500-60 MG TABS Take 1 tablet by mouth daily.      [name] APAP-Isometheptene-Caffeine (PRODRIN) 500-130-20 MG TABS Take 2 tablets by mouth daily as needed. At the onset of migraine       Assessment: 71 y/o female s/p left shoulder hemiarthroplasty. Received 1500 mg vanc preop at 0800 today (8/24) and started on cipro post op.  Pt currently afebrile, and WBC wnl.  Normalized CrCl ~59 ml/min.   Goal of Therapy:  Vancomycin trough level 15-20 mcg/ml  Plan:  1. Vancomycin 1500 mg q24 h starting tomorrow. 2. Follow up renal function and troughs as necessary.      Doris Cheadle, PharmD Clinical Pharmacist Pager: [phone] Phone: [phone] 01/01/2012 2:14 PM

## 2012-01-01 NOTE — Anesthesia Postprocedure Evaluation (Signed)
  Anesthesia Post-op Note  Patient: [name]  Procedure(s) Performed: Procedure(s) (LRB): SHOULDER HEMI-ARTHROPLASTY (Left)  Patient Location: PACU  Anesthesia Type: GA combined with regional for post-op pain  Level of Consciousness: awake, alert  and oriented  Airway and Oxygen Therapy: Patient Spontanous Breathing and Patient connected to nasal cannula oxygen  Post-op Pain: none  Post-op Assessment: Post-op Vital signs reviewed and Patient's Cardiovascular Status Stable  Post-op Vital Signs: Reviewed and stable  Complications: No apparent anesthesia complications

## 2012-01-01 NOTE — Anesthesia Postprocedure Evaluation (Signed)
Anesthesia Post Note  Patient: [name]  Procedure(s) Performed: Procedure(s) (LRB): SHOULDER HEMI-ARTHROPLASTY (Left)  Anesthesia type: general  Patient location: PACU  Post pain: Pain level controlled  Post assessment: Patient's Cardiovascular Status Stable  Last Vitals:  Filed Vitals:   01/01/12 1345  BP: 154/73  Pulse: 107  Temp: 37.3 C  Resp: 16    Post vital signs: Reviewed and stable  Level of consciousness: sedated  Complications: No apparent anesthesia complications

## 2012-01-01 NOTE — Op Note (Signed)
[name], [name]              ACCOUNT NO.:  [id number]  MEDICAL RECORD NO.:  [id number]  LOCATION:  [facility]                         FACILITY:  [facility]  PHYSICIAN:  Dionne Ano. Rodgers Likes, M.D.DATE OF BIRTH:  06-Jul-1940  DATE OF PROCEDURE: DATE OF DISCHARGE:                              OPERATIVE REPORT   PREOPERATIVE DIAGNOSIS:  Four-part head splitting comminuted humerus fracture about the glenohumeral joint with intact glenoid.  POSTOPERATIVE DIAGNOSIS:  Four-part head splitting comminuted humerus fracture about the glenohumeral joint with intact glenoid.  PROCEDURE: 1. Hemiarthroplasty left shoulder secondary to four-part head     splitting fracture. 2. Biceps tendon tenolysis. 3. Stress radiography.  SURGEON:  Dionne Ano. Amanda Pea, M.D.  ASSISTANT:  Karie Chimera, P.A.-C.  COMPLICATIONS:  None.  ANESTHESIA:  General.  TOURNIQUET TIME:  0.  ESTIMATED BLOOD LOSS:  Less than 250 mL.  INDICATIONS FOR THE PROCEDURE:  A 71 year old female with history of medical problems who presents for evaluation and treatment.  I have counseled in regards to risks and benefits of surgery.  I discussed with her that given the head splitting four-part fracture that I would recommend hemiarthroplasty.  She understood the risks and benefits and desired to proceed.  OPERATION IN DETAIL:  The patient was seen by myself and Anesthesia, preoperative block was placed, she was taken to the operative suite. She was placed in a semi-Fowler position.  I made sure that we had plenty of room for the arm to be manipulated.  At this time, she was prepped with Hibiclens followed by an additional prep about the skin surface.  Time-out was called.  Pre and postop check list complete and under sterile field with drapes applied a deltopectoral incision was made.  I placed the incision from just below the coracoid process to the anterior aspect of the arm.  Once the incision was made, I located the cephalic  pain and took it with the pectoralis given the fact that she is a bilateral mastectomy patient and has a history of slight lymphedema. I was more concerned about the pectoralis and its venous drainage than the deltoid.  Once this was mobilized, I identified the clavipectoral fascia and at this time evaluated the conjoined tendon.  I made sure to protect the musculocutaneous nerve and placed retractor underneath the conjoined tendon.  Following this, I then palpated the long head of the biceps and the pectoralis.  I recessed the pectoralis major slightly with cautery and following this placed retractors.  At this point in time, I was able to manipulate the arm and the fracture.  I placed tag stitches around the subscapularis and this achieved good fixation of the subscapularis and the bony anchorage about the lesser tuberosity.  I then did a similar procedure for the greater tuberosity and rotator cuff superiorly.  This was done without difficulty.  Following this, the biceps tendon tenolysis, tenosynovectomy and the rotator cuff interval was opened.  I then excised the radial head.  The radial head was trialed and it fit nicely to a 21 mm 48 variety.  This was 48 mm head time with height of 21 mm.  At this time, I took the head, crushed it and  prepared for cancellous bone.  Following this, I then mobilized the area.  I then sequentially performed medullary reaming.  The patient was reamed to a tanned and following this I placed a trial stem size 10, head size 48 mm in place.  This achieved what I felt was a good reduction.  I marked the area for a proper positioning given the degree of retroversion, etc.  I wanted to make sure that the patient did not have excessive anterior or posterior translation or retroversion issues and thus I was very careful with my trialing.  The shaft was very comminuted thus it was difficult to tell precisely the biceps tendon groove, but by keeping the biceps  intact I felt comfortable with this. The patient had the trials removed and at this juncture, I placed irrigation in the wound, pre-placed sutures for tie-down of the prosthesis after cementation.  Once this was done, I then pre-placed the shaft sutures and following this prepared the shaft of the humerus.  A bone plug size 3 was used.  The patient tolerated this well.  Following this, I then placed the stem and cement in place.  Cement was placed followed by the stem.  Cement was allowed to cure and I made sure there was ample room below the fracture surface for incorporation of bone graft and the tuberosities.  Following allowing the cement to harden with third generation cement technique, I then placed the assembled humerus into the joint and checked it.  I was pleased with this.  I checked the joint multiple times and was happy with the look to the joint and all surfaces.  There was no excessive anterior-posterior translation, no tendency towards dislocation, and under live fluoro in the operative suite, I verified a good axillary view.  I was pleased with the findings.  Following this, I then bone grafted the remaining area and tied down with multiple #2 FiberWire sutures, the left serving greater tuberosities as well as the rotator cuff and subscap.  The biceps was left free.  I did not tenodese it.  The patient tolerated this quite well.  There were no complicating features.  X-rays looked excellent.  I was pleased with the range of motion and the stability of the construct.  The patient tolerated the procedure well.  We are going to hold her still and participate in the postop rehabilitation program of course. She had adequate hemostasis and thus we did not place a drain.  She had a preoperative block and thus no further anesthetic administration was given or made.  Following this, the wound was closed with Vicryl in the deltopectoral interval and 3-0 Vicryl in the subcu  followed by subcuticular Prolene. A shoulder immobilizer was placed.  She was awoken from anesthesia and taken to recovery room.  Wound closure went without difficulty.  We did irrigate with greater then 3-4 L of saline at the end of the case and throughout the case we also irrigated.  Overall, I was very pleased with the recreation of the tuberosities and shoulder.  I wanted to go very slow with her given all issues and watch out for any lymphedema of course.  She tolerated the procedure well.  There were no complications.  She did have a rather comminuted complex fracture and thus I will want to be very careful, slow, and cautious with her.  The patient understands the do's and don'ts etc., and all questions have been encouraged and answered.     Dionne Ano.  Amanda Pea, M.D.     [facility]  D:  01/01/2012  T:  01/01/2012  Job:  [id number]

## 2012-01-01 NOTE — Transfer of Care (Signed)
Immediate Anesthesia Transfer of Care Note  Patient: [name]  Procedure(s) Performed: Procedure(s) (LRB): SHOULDER HEMI-ARTHROPLASTY (Left)  Patient Location: PACU  Anesthesia Type: General and GA combined with regional for post-op pain  Level of Consciousness: awake and alert   Airway & Oxygen Therapy: Patient Spontanous Breathing and Patient connected to face mask oxygen  Post-op Assessment: Report given to PACU RN and Post -op Vital signs reviewed and stable  Post vital signs: Reviewed and stable  Complications: No apparent anesthesia complications

## 2012-01-01 NOTE — Anesthesia Procedure Notes (Addendum)
Anesthesia Regional Block:  Interscalene brachial plexus block  Pre-Anesthetic Checklist: ,, timeout performed, Correct Patient, Correct Site, Correct Laterality, Correct Procedure, Correct Position, site marked, Risks and benefits discussed,  Surgical consent,  Pre-op evaluation,  At surgeon's request and post-op pain management  Laterality: Left  Prep: chloraprep       Needles:  Injection technique: Single-shot  Needle Type: Echogenic Stimulator Needle     Needle Length: 5cm 5 cm     Additional Needles:  Procedures: ultrasound guided and nerve stimulator Interscalene brachial plexus block  Nerve Stimulator or Paresthesia:  Response: 0.4 mA,   Additional Responses:   Narrative:  Start time: 01/01/2012 7:15 AM End time: 01/01/2012 7:30 AM  Performed by: Personally  Anesthesiologist: Arta Bruce MD  Additional Notes: Monitors applied. Patient sedated. Sterile prep and drape,hand hygiene and sterile gloves were used. Relevant anatomy identified.Needle position confirmed.Local anesthetic injected incrementally after negative aspiration. Local anesthetic spread visualized around nerve(s). Vascular puncture avoided. No complications. Image printed for medical record.The patient tolerated the procedure well.       Interscalene brachial plexus block Procedure Name: Intubation Date/Time: 01/01/2012 8:16 AM Performed by: Garen Lah Pre-anesthesia Checklist: Patient identified, Timeout performed, Emergency Drugs available, Suction available and Patient being monitored Patient Re-evaluated:Patient Re-evaluated prior to inductionOxygen Delivery Method: Circle system utilized Preoxygenation: Pre-oxygenation with 100% oxygen Intubation Type: IV induction Ventilation: Mask ventilation without difficulty and Oral airway inserted - appropriate to patient size Laryngoscope Size: Mac and 4 Grade View: Grade II Tube type: Oral Tube size: 7.5 mm Number of attempts: 1 Airway  Equipment and Method: Stylet Placement Confirmation: ETT inserted through vocal cords under direct vision,  positive ETCO2 and breath sounds checked- equal and bilateral Secured at: 21 cm Tube secured with: Tape Dental Injury: Teeth and Oropharynx as per pre-operative assessment

## 2012-01-01 NOTE — Op Note (Signed)
See Dictation #[id number] Dominica Severin MD

## 2012-01-01 NOTE — Preoperative (Signed)
Beta Blockers   Reason not to administer Beta Blockers:Not Applicable. Atenolol 25 mg @ 0630 01/01/12

## 2012-01-01 NOTE — H&P (Signed)
.. [name] is an 71 y.o. female.    Chief Complaint: I broke my shoulder HPI: The patient is a very pleasant 71 year old female who presented to [facility] orth[facility] for evaluation of her left shoulder. She unfortunately sustained a fall onto the left upper extremity visiting outside of [place] she was noted to have comminuted four-part humeral head fracture about the left upper extremity. She was placed into a sling and referred for further followup during [place]. Was seen and evaluated at the office setting. She was noted have pain about the left upper extremity she was neurovascularly intact. Radiographs reveal a comminuted 4 part humeral head fracture. We discussed with her these issues, given the degree of comminution, her activity level and need for a good upper extremity for activities of daily living we discussed with her proceeding with surgical intervention in the form of a left shoulder hemiarthroplasty. Risk and benefits of the surgery were discussed at length. The patient desires to proceed. She presents to [facility] cone morning for surgical fixation. We discussed with her all issues preoperatively, all questions were encouraged and answered.  Past Medical History  Diagnosis Date  . Hx of breast cancer   . Osteoporosis   . Shingles   . Neuropathy   . AC (acromioclavicular) joint bone spurs   . Diverticulitis   . Heartburn   . Headache     Migraines  . Hammer toe   . PONV (postoperative nausea and vomiting)   . Hypertension     takes Atenolol daily  . History of bronchitis   . Pneumonia     history of;many yrs ago  . History of migraine     last one about 6-78months ago;Prodrin prn  . Joint pain   . Arthritis     right knee;injections every 6months   . Scoliosis   . Sensitive skin     sees a dermatoligist yrly  . IBS (irritable bowel syndrome)   . Vitamin d deficiency     takes Vit d daily  . Diabetes mellitus     takes Amaryl and Januvia daily  .  Early cataracts, bilateral   . Glaucoma   . Cancer     hx of breast ca    Past Surgical History  Procedure Date  . Cholecystectomy 06/2008  . Tonsillectomy   . Thyroidectomy     54yrs ago  . Mastectomy 1994, 1995  . Laparoscopic oophorectomy   . Laparotomy   . Diagnostic laparoscopy   . Adenoidectomy     at age 51  . Excision removed from neck   . Hysteroscopy   . Colonoscopy     Family History  Problem Relation Age of Onset  . Arthritis Mother   . COPD Father   . Heart disease Father   . Hypertension Father   . Hyperlipidemia Father     Social History:  reports that she has never smoked. She does not have any smokeless tobacco history on file. She reports that she does not drink alcohol or use illicit drugs.  Allergies:  Allergies  Allergen Reactions  . Cephalexin Other (See Comments)    Patient can't remember reaction  . Erythromycin Ethylsuccinate Hives and Diarrhea  . Nitrofurantoin Other (See Comments)    Severe headache  . Percocet (Oxycodone-Acetaminophen) Itching    Medications Prior to Admission  Medication Sig Dispense Refill  . Alpha-Lipoic Acid 600 MG CAPS Take 1 capsule by mouth daily.      [name] aspirin 325  MG tablet Take 325 mg by mouth once a week. Sunday only      . aspirin 81 MG tablet Take 81 mg by mouth. Daily Monday through Saturday      . atenolol (TENORMIN) 25 MG tablet Take 1 tablet (25 mg total) by mouth daily.  90 tablet  3  . bimatoprost (LUMIGAN) 0.03 % ophthalmic drops Place 1 drop into both eyes at bedtime.        . brinzolamide (AZOPT) 1 % ophthalmic suspension Place 1 drop into both eyes 2 (two) times daily.      . cholecalciferol (VITAMIN D) 1000 UNITS tablet Take 1,000 Units by mouth daily.      [name] co-enzyme Q-10 50 MG capsule Take 150 mg by mouth daily.      . cyanocobalamin (,VITAMIN B-12,) 1000 MCG/ML injection Inject 1,000 mcg into the muscle every 21 ( twenty-one) days. Last dose 1/25      . estradiol (ESTRING) 2 MG vaginal ring  Place 2 mg vaginally every 3 (three) months. follow package directions       . Eszopiclone (LUNESTA PO) Take 5 mg by mouth at bedtime. lunesta 5mg       . Flaxseed, Linseed, (FLAX SEED OIL) 1000 MG CAPS Take 1 capsule by mouth daily.       [name] FLUoxetine (PROZAC) 20 MG capsule TAKE ONE CAPSULE BY MOUTH EVERY DAY  30 capsule  6  . glimepiride (AMARYL) 4 MG tablet TAKE ONE TABLET BY MOUTH EVERY DAY  30 tablet  11  . Grape Seed Extract 100 MG CAPS Take 150 mg by mouth daily.       [name] HYDROmorphone (DILAUDID) 2 MG tablet Take 2-4 mg by mouth every 4 (four) hours as needed. For pain      . magnesium gluconate (MAGONATE) 500 MG tablet Take 500 mg by mouth at bedtime.        . meloxicam (MOBIC) 15 MG tablet Take 1 tablet (15 mg total) by mouth daily.  14 tablet  0  . Methylcellulose, Laxative, (CITRUCEL) 500 MG TABS Take 2 tablets by mouth daily after supper.      . Multiple Vitamins-Minerals (CENTRUM SILVER PO) Take 1 tablet by mouth daily.       . Omega-3 Fatty Acids (FISH OIL) 1200 MG CAPS Take 1 capsule by mouth 2 (two) times daily.      . pramipexole (MIRAPEX) 0.5 MG tablet TAKE ONE TABLET BY MOUTH AT BEDTIME  30 tablet  11  . Selenium 200 MCG CAPS Take 200 mcg by mouth daily.      . sitaGLIPtin (JANUVIA) 100 MG tablet Take 100 mg by mouth daily.      . Vitamin Mixture (ESTER-C) 500-60 MG TABS Take 1 tablet by mouth daily.      [name] APAP-Isometheptene-Caffeine (PRODRIN) 500-130-20 MG TABS Take 2 tablets by mouth daily as needed. At the onset of migraine        Results for orders placed during the hospital encounter of 01/01/12 (from the past 48 hour(s))  GLUCOSE, CAPILLARY     Status: Abnormal   Collection Time   01/01/12  6:30 AM      Component Value Range Comment   Glucose-Capillary 212 (*) 70 - 99 mg/dL    Dg Chest 1 View  4/54/0981  *RADIOLOGY REPORT*  Clinical Data: Preoperative assessment for left shoulder hemiarthroplasty; history diabetes, hypertension, breast cancer  CHEST - 1 VIEW   Comparison: 12/24/2010  Findings: Normal heart size, mediastinal contours,  and pulmonary vascularity. Calcified thoracic aorta. Linear subsegmental atelectasis left base. Lungs otherwise clear. No pleural effusion or pneumothorax. Surgical clips within the axillae bilaterally with prior bilateral mastectomy. Mild broad-based levoconvex thoracic scoliosis with scattered degenerative disc disease changes.  IMPRESSION: Subsegmental atelectasis left base.   Original Report Authenticated By: Lollie Marrow, M.D.      Review of systems: Negative For any recent weight loss, malaise, fevers ,chills, chest pain, shortness of breath difficulties with bowel or bladder function. Noted pain about the left upper extremity given her recent fracture process. Blood pressure 146/78, pulse 98, temperature 99.1 F (37.3 C), temperature source Oral, resp. rate 20, SpO2 92.00%.  Head: Atraumatic normocephalic  Chest: Equal expansions noted respirations nonlabored no wheezing or rhonchi present.  Abdomen bowel sounds are positive abdomen is nontender  Upper extremity with notable ecchymosis and associated pain and swelling given her fracture, radial ulnar and median nerve are intact, digital range of motion is intact neurovascularly she is intact lower extremities stable and nontender Assessment/Plan Left comminuted, four-part, humeral head fracture DIABETES MELLITUS, TYPE II HYPERLIPIDEMIA ANEMIA, B12 DEFICIENCY INSOMNIA, CHRONIC RESTLESS LEG SYNDROME, MILD NEUROPATHY, IDIOPATHIC PERIPHERAL NEC GERD FATTY LIVER DISEASE MENOPAUSAL SYNDROME OSTEOPOROSIS ABDOMINAL TENDERNESS, RIGHT UPPER QUADRANT COMPRESSION FRACTURE, LUMBAR VERTEBRAE UNS ADVRS EFF UNS RX MEDICINAL&BIOLOGICAL SBSTNC BREAST CANCER, HX OF Foot tendinitis  .Marland KitchenWe are planning surgery for your upper extremity. The risk and benefits of surgery include risk of bleeding infection anesthesia damage to normal structures and failure of the surgery to accomplish its  intended goals of relieving symptoms and restoring function with this in mind we'll going to proceed. I have specifically discussed with the patient the pre-and postoperative regime and the does and don'ts and risk and benefits in great detail. Risk and benefits of surgery also include risk of dystrophy chronic nerve pain failure of the healing process to go onto completion and other inherent risks of surgery The relavent the pathophysiology of the disease/injury process, as well as the alternatives for treatment and postoperative course of action has been discussed in great detail with the patient who desires to proceed.  We will do everything in our power to help you (the patient) restore function to the upper extremity. Is a pleasure to see this patient today.  Shelbia Scinto L 01/01/2012, 7:33 AM

## 2012-01-01 NOTE — Progress Notes (Signed)
OR ADVISED OF PTS  "READY FOR OR" ALSO AMY WILL SPEAK TO DR. JACKSON ABOUT CBG.

## 2012-01-02 LAB — GLUCOSE, CAPILLARY
Glucose-Capillary: 166 mg/dL — ABNORMAL HIGH (ref 70–99)
Glucose-Capillary: 179 mg/dL — ABNORMAL HIGH (ref 70–99)
Glucose-Capillary: 188 mg/dL — ABNORMAL HIGH (ref 70–99)
Glucose-Capillary: 177 mg/dL — ABNORMAL HIGH (ref 70–99)

## 2012-01-02 MED ORDER — HYDROMORPHONE HCL 2 MG PO TABS
2.0000 mg | ORAL_TABLET | ORAL | Status: DC | PRN
  Administered 2012-01-02 – 2012-01-05 (×11): 2 mg via ORAL
  Filled 2012-01-02 (×11): qty 1

## 2012-01-02 MED ORDER — BISACODYL 5 MG PO TBEC
5.0000 mg | DELAYED_RELEASE_TABLET | Freq: Every day | ORAL | Status: DC | PRN
  Filled 2012-01-02: qty 1

## 2012-01-02 MED ORDER — ACETAMINOPHEN 325 MG PO TABS
325.0000 mg | ORAL_TABLET | Freq: Four times a day (QID) | ORAL | Status: DC | PRN
  Administered 2012-01-02: 650 mg via ORAL
  Filled 2012-01-02: qty 2

## 2012-01-02 MED ORDER — MAGNESIUM CITRATE PO SOLN
1.0000 | Freq: Once | ORAL | Status: AC
  Administered 2012-01-02: 1 via ORAL
  Filled 2012-01-02: qty 296

## 2012-01-02 MED ORDER — KETOROLAC TROMETHAMINE 30 MG/ML IJ SOLN
30.0000 mg | Freq: Once | INTRAMUSCULAR | Status: AC
  Administered 2012-01-02: 30 mg via INTRAVENOUS
  Filled 2012-01-02: qty 1

## 2012-01-02 NOTE — Progress Notes (Signed)
Physical Therapy Evaluation Patient Details Name: [name] MRN: [id number] DOB: 1940/12/18 Today's Date: 01/02/2012 Time: 9528-4132 PT Time Calculation (min): 26 min  PT Assessment / Plan / Recommendation Clinical Impression  71 yo female admitted post fall with L humeral head comminuted fracture, now s/p L shoulder hemiarthroplasty; Presents to PT with decr functional mobility; Will benefot from acute PT to maximize independence and safety with mobiltiy, amb, balance, and stair negotiation to enable safe dc home    PT Assessment  Patient needs continued PT services    Follow Up Recommendations  Home health PT;Supervision/Assistance - 24 hour (Husband educated that he must stay very close to pt whenever pt is on her feet; Will need very close guard when walking or transferring to prevent falls; Husband verbalized understanding)    Barriers to Discharge        Equipment Recommendations  3 in 1 bedside comode (Will defer shower equip recs to OT)    Recommendations for Other Services OT consult (as ordered)   Frequency Min 6X/week    Precautions / Restrictions Precautions Precautions: Shoulder Required Braces or Orthoses: Other Brace/Splint Other Brace/Splint: Sling Restrictions Weight Bearing Restrictions: Yes LUE Weight Bearing: Non weight bearing   Pertinent Vitals/Pain Grimace with supine to sit; was premedicated      Mobility  Bed Mobility Bed Mobility: Supine to Sit;Sitting - Scoot to Edge of Bed Supine to Sit: 4: Min assist;With rails;HOB elevated (slightly elevated) Sitting - Scoot to Edge of Bed: 4: Min guard;With rail Details for Bed Mobility Assistance: Cues for technique and precautions; Of note, pt sleeps in recliner Transfers Transfers: Sit to Stand;Stand to Sit Sit to Stand: 3: Mod assist;From bed;With upper extremity assist Stand to Sit: 4: Min assist;To chair/3-in-1;With upper extremity assist;With armrests Details for Transfer Assistance: Light mod  assist for sit to sand as pt needed anti-gravity lift assist; She reports was dependent on UE assistance for sit to/from stand prior to this fall Ambulation/Gait Ambulation/Gait Assistance: 4: Min assist;3: Mod assist Ambulation Distance (Feet): 25 Feet Assistive device: 1 person hand held assist (second person helpful for IV) Ambulation/Gait Assistance Details: Unsteady gait, with occasional posterior lean; Occasionally requiring mod assist for balance, in particular with turns Gait Pattern: Decreased step length - right;Decreased step length - left    Exercises     PT Diagnosis: Difficulty walking;Generalized weakness;Acute pain  PT Problem List: Decreased strength;Decreased activity tolerance;Decreased balance;Decreased mobility;Decreased knowledge of use of DME;Pain PT Treatment Interventions: DME instruction;Gait training;Stair training;Functional mobility training;Therapeutic activities;Therapeutic exercise;Balance training;Patient/family education   PT Goals Acute Rehab PT Goals PT Goal Formulation: With patient Time For Goal Achievement: 01/16/12 Potential to Achieve Goals: Good Pt will go Supine/Side to Sit: with min assist PT Goal: Supine/Side to Sit - Progress: Goal set today Pt will go Sit to Supine/Side: with min assist PT Goal: Sit to Supine/Side - Progress: Goal set today Pt will go Sit to Stand: with min assist PT Goal: Sit to Stand - Progress: Goal set today Pt will go Stand to Sit: with min assist PT Goal: Stand to Sit - Progress: Goal set today Pt will Ambulate: 51 - 150 feet;with min assist;with cane (Quad cane versus single point cane) PT Goal: Ambulate - Progress: Goal set today Pt will Go Up / Down Stairs: 3-5 stairs;with min assist;with rail(s) (One rail left) PT Goal: Up/Down Stairs - Progress: Goal set today  Visit Information  Last PT Received On: 01/02/12 Assistance Needed: +1 (+2 was helpful with IV ; tall  lady) PT/OT Co-Evaluation/Treatment: Yes      Subjective Data  Subjective: Agreeable to amb Patient Stated Goal: be able to manage   Prior Functioning  Home Living Lives With: Spouse Available Help at Discharge: Family;Available 24 hours/day Type of Home: House Home Access: Stairs to enter [employer] of Steps: 3 Entrance Stairs-Rails: Left Home Layout: One level;Able to live on main level with bedroom/bathroom Bathroom Shower/Tub: Tub/shower unit Bathroom Toilet: Handicapped height Home Adaptive Equipment: Straight cane;Walker - rolling Prior Function Level of Independence: Independent Able to Take Stairs?: Yes Driving: Yes Communication Communication: No difficulties Dominant Hand: Right    Cognition  Overall Cognitive Status: Appears within functional limits for tasks assessed/performed Arousal/Alertness: Awake/alert Orientation Level: Appears intact for tasks assessed Behavior During Session: [facility] for tasks performed    Extremity/Trunk Assessment Right Upper Extremity Assessment RUE ROM/Strength/Tone: [facility] for tasks assessed Left Upper Extremity Assessment LUE ROM/Strength/Tone: Deficits;Due to pain;Due to precautions LUE ROM/Strength/Tone Deficits: NWB left shoulder; in sling; Sling positioned for appropriate fit Right Lower Extremity Assessment RLE ROM/Strength/Tone: Deficits RLE ROM/Strength/Tone Deficits: Grossly decr strength, with dependence on RUE assist for successful sit to/from stand; Reports meniscal problems R knee RLE Sensation: History of peripheral neuropathy Left Lower Extremity Assessment LLE ROM/Strength/Tone: Deficits LLE ROM/Strength/Tone Deficits: Grossly decr strength, with dependence on RUE assist for successful sit to/from stand; Noted Left foot deformity (resembles Charcot-type collapse); Pt is being followed by Dr. Lestine Box, and was considering surgery before shoulder fracture; wears a boot for amb community distances LLE Sensation: History of peripheral neuropathy   Balance     End of Session PT - End of Session Equipment Utilized During Treatment: Gait belt (slign) Activity Tolerance: Patient tolerated treatment well Patient left: in chair;with call bell/phone within reach;with family/visitor present Nurse Communication: Mobility status  GP     [facility] [place], [place] [phone]  01/02/2012, 11:32 AM

## 2012-01-02 NOTE — Progress Notes (Signed)
Occupational Therapy Treatment Patient Details Name: [name] MRN: [id number] DOB: 08-08-1940 Today's Date: 01/02/2012 Time: 7829-5621 OT Time Calculation (min): 12 min  OT Assessment / Plan / Recommendation Comments on Treatment Session Focus of session on digit/wrist/elbow ROM.      Follow Up Recommendations  Home health OT;Supervision/Assistance - 24 hour    Barriers to Discharge       Equipment Recommendations  3 in 1 bedside comode;Tub/shower bench    Recommendations for Other Services    Frequency Min 2X/week   Plan Discharge plan remains appropriate    Precautions / Restrictions Precautions Precautions: Shoulder Type of Shoulder Precautions: MD did not mention ROM limitations in chart.  Limited pt to no shoulder ROM during session until further notified. Precaution Booklet Issued: Yes (comment) Required Braces or Orthoses: Other Brace/Splint Other Brace/Splint: Sling Restrictions Weight Bearing Restrictions: Yes LUE Weight Bearing: Non weight bearing   Pertinent Vitals/Pain See vitals    ADL   Equipment Used:  (LUE sling) Transfers/Ambulation Related to ADLs: not assessed ADL Comments: Pt in chair during session.   Pt concerned about how she will put on her wig. Educated pt that she will be unable to lift L UE to don wig for a while and husband will need to assist.  Focus of session on HEP due to pt too lethargic to benefit from ADL education at this time.    OT Diagnosis: Generalized weakness;Acute pain  OT Problem List: Impaired balance (sitting and/or standing);Decreased strength;Decreased range of motion;Pain;Impaired UE functional use;Decreased knowledge of precautions OT Treatment Interventions: Self-care/ADL training;Therapeutic exercise;Therapeutic activities;Patient/family education;Balance training;DME and/or AE instruction   OT Goals Acute Rehab OT Goals OT Goal Formulation: With patient Time For Goal Achievement: 01/09/12 Potential to Achieve  Goals: Good ADL Goals Miscellaneous OT Goals today Miscellaneous OT Goal #2: Pt will independently perform digit, wrist, elbow AROM. OT Goal: Miscellaneous Goal #2 - Progress: Progressing toward goals  Visit Information  Last OT Received On: 01/02/12 Assistance Needed: +1 (+2 helpful with IV)    Subjective Data      Prior Functioning  Home Living Lives With: Spouse Available Help at Discharge: Family;Available 24 hours/day Type of Home: House Home Access: Stairs to enter [employer] of Steps: 3 Entrance Stairs-Rails: Left Home Layout: One level;Able to live on main level with bedroom/bathroom Bathroom Shower/Tub: [employer] manufacturing systems: Handicapped height Home Adaptive Equipment: Straight cane;Walker - rolling Prior Function Level of Independence: Independent Able to Take Stairs?: Yes Driving: Yes Communication Communication: No difficulties Dominant Hand: Right    Cognition  Overall Cognitive Status: Appears within functional limits for tasks assessed/performed Arousal/Alertness: Lethargic Orientation Level: Appears intact for tasks assessed Behavior During Session: [facility] for tasks performed    Mobility Bed Mobility Bed Mobility: Not assessed  Transfers Transfers: Not assessed l   Exercises General Exercises - Upper Extremity Elbow Flexion: AROM;Left;10 reps;Seated Elbow Extension: AROM;Left;10 reps;Seated Wrist Flexion: AROM;Left;10 reps;Seated Wrist Extension: AROM;Left;10 reps;Seated Digit Composite Flexion: AROM;Left;10 reps;Seated Composite Extension: AROM;Left;10 reps;Seated  Balance    End of Session OT - End of Session Equipment Utilized During Treatment:  (LUE sling) Activity Tolerance: Patient limited by fatigue Patient left: in chair;with call bell/phone within reach;with family/visitor present Nurse Communication: Mobility status  GO    01/02/2012 Cipriano Mile OTR/L Pager [phone] Office [phone]  Cipriano Mile 01/02/2012, 3:56 PM

## 2012-01-02 NOTE — Progress Notes (Addendum)
Occupational Therapy Evaluation Patient Details Name: [name] MRN: [id number] DOB: [birth year] Today's Date: 01/02/2012 Time: 4098-1191 OT Time Calculation (min): 26 min  OT Assessment / Plan / Recommendation Clinical Impression  Pt s/p with L humeral head comminuted fracture, now s/p L shoulder hemiarthroplasty thus affecting PLOF.  Pt has been at home for 1 week post fall with LUE sling prior to surgery.  Will benefit from acute OT services to address below problem list in prep for d/c home with close supervision from husband.    OT Assessment  Patient needs continued OT Services    Follow Up Recommendations  Home health OT;Supervision/Assistance - 24 hour    Barriers to Discharge      Equipment Recommendations  3 in 1 bedside comode;Tub/shower bench    Recommendations for Other Services    Frequency  Min 2X/week    Precautions / Restrictions Precautions Precautions: Shoulder Type of Shoulder Precautions: MD did not mention ROM limitations in chart.  Limited pt to no shoulder ROM during session until further notified. Precaution Booklet Issued: Yes (comment) Required Braces or Orthoses: Other Brace/Splint Other Brace/Splint: Sling Restrictions Weight Bearing Restrictions: Yes LUE Weight Bearing: Non weight bearing   Pertinent Vitals/Pain See vitals    ADL  Upper Body Dressing: Performed;+1 Total assistance Where Assessed - Upper Body Dressing: Unsupported sitting Toilet Transfer: Simulated;Moderate assistance Toilet Transfer Method: Sit to Barista:  (chair) Equipment Used: Gait belt (LUE sling) Transfers/Ambulation Related to ADLs: min -mod assist for ambulation throughout room.  (1 person HHA)  Mod assist required for balance during turns. Occasional posterior lean. ADL Comments: Pt required +1 total assist to reposition LUE sling.  Pt's husband present during session and educated on sling donning/doffing and positioning.    OT  Diagnosis: Generalized weakness;Acute pain  OT Problem List: Impaired balance (sitting and/or standing);Decreased strength;Decreased range of motion;Pain;Impaired UE functional use;Decreased knowledge of precautions OT Treatment Interventions: Self-care/ADL training;Therapeutic exercise;Therapeutic activities;Patient/family education;Balance training;DME and/or AE instruction   OT Goals Acute Rehab OT Goals OT Goal Formulation: With patient Time For Goal Achievement: 01/09/12 Potential to Achieve Goals: Good ADL Goals Pt Will Perform Upper Body Bathing: with supervision;Sitting, chair;Sitting, edge of bed ADL Goal: Upper Body Bathing - Progress: Goal set today Pt Will Perform Upper Body Dressing: with supervision;Sitting, chair;Sitting, bed ADL Goal: Upper Body Dressing - Progress: Goal set today Pt Will Transfer to Toilet: with supervision;Ambulation;3-in-1 ADL Goal: Toilet Transfer - Progress: Goal set today Pt Will Perform Tub/Shower Transfer: Tub transfer;with supervision;Ambulation;Transfer tub bench ADL Goal: Tub/Shower Transfer - Progress: Goal set today Miscellaneous OT Goals Miscellaneous OT Goal #1: Pt's husband will be independent in assisting pt don/doff L UE sling. OT Goal: Miscellaneous Goal #1 - Progress: Goal set today Miscellaneous OT Goal #2: Pt will independently perform digit, wrist, elbow AROM. OT Goal: Miscellaneous Goal #2 - Progress: Goal set today  Visit Information  Last OT Received On: 01/02/12 Assistance Needed: +1 (+2 helpful with IV)    Subjective Data      Prior Functioning  Vision/Perception  Home Living Lives With: Spouse Available Help at Discharge: Family;Available 24 hours/day Type of Home: House Home Access: Stairs to enter [employer] of Steps: 3 Entrance Stairs-Rails: Left Home Layout: One level;Able to live on main level with bedroom/bathroom Bathroom Shower/Tub: Tub/shower unit Bathroom Toilet: Handicapped height Home  Adaptive Equipment: Straight cane;Walker - rolling Prior Function Level of Independence: Independent Able to Take Stairs?: Yes Driving: Yes Communication Communication: No difficulties Dominant Hand:  Right      Cognition  Overall Cognitive Status: Appears within functional limits for tasks assessed/performed Arousal/Alertness: Awake/alert Orientation Level: Appears intact for tasks assessed Behavior During Session: [facility] for tasks performed    Extremity/Trunk Assessment Right Upper Extremity Assessment RUE ROM/Strength/Tone: Within functional levels Left Upper Extremity Assessment LUE ROM/Strength/Tone: Deficits;Unable to fully assess;Due to precautions;Due to pain LUE ROM/Strength/Tone Deficits: Shoulder strength/ROM not assessed. Digits, elbow and wrist ROM WFL.   Mobility Bed Mobility Bed Mobility: Supine to Sit;Sitting - Scoot to Edge of Bed Supine to Sit: 4: Min assist;With rails;HOB elevated (slightly elevated) Sitting - Scoot to Edge of Bed: 4: Min guard;With rail Details for Bed Mobility Assistance: Cues for technique and precautions; Of note, pt sleeps in recliner Transfers Sit to Stand: 3: Mod assist;From bed;With upper extremity assist Stand to Sit: 4: Min assist;To chair/3-in-1;With upper extremity assist;With armrests Details for Transfer Assistance: Light mod assist for sit to sand as pt needed anti-gravity lift assist; She reports was dependent on UE assistance for sit to/from stand prior to this fall   Exercise    Balance    End of Session OT - End of Session Equipment Utilized During Treatment: Gait belt (LUE sling) Activity Tolerance: Patient tolerated treatment well Patient left: in chair;with call bell/phone within reach;with family/visitor present Nurse Communication: Mobility status  GO   01/02/2012 Cipriano Mile OTR/L Pager [phone] Office [phone]   Cipriano Mile 01/02/2012, 3:39 PM

## 2012-01-02 NOTE — Progress Notes (Signed)
Subjective: 1 Day Post-Op Procedure(s) (LRB): SHOULDER HEMI-ARTHROPLASTY (Left) Patient reports pain as moderate.  Patient notes no locking popping catching in her hand she has excellent movement in her hand she states the pain is rather significant. Her block wore off late yesterday. She is icing the shoulder and in her sling. Her husband is at her bedside. There is no complications to date. She is using her incentive spur on occur. She is moving her feet. She's been up to the bathroom and voiding well.  Objective: Vital signs in last 24 hours: Temp:  [98.3 F (36.8 C)-101.8 F (38.8 C)] 99.3 F (37.4 C) (08/25 0626) Pulse Rate:  [97-114] 106  (08/25 0626) Resp:  [11-29] 18  (08/25 0626) BP: (113-163)/(39-73) 148/39 mmHg (08/25 0626) SpO2:  [82 %-100 %] 96 % (08/25 0626)  Intake/Output from previous day: 08/24 0701 - 08/25 0700 In: 2720 [P.O.:720; I.V.:2000] Out: 400 [Blood:400] Intake/Output this shift:     Basename 12/31/11 1453  HGB 11.5*    Basename 12/31/11 1453  WBC 9.6  RBC 3.90  HCT 35.4*  PLT 265    Basename 12/31/11 1453  NA 136  K 4.2  CL 97  CO2 31  BUN 18  CREATININE 0.67  GLUCOSE 139*  CALCIUM 9.5   No results found for this basename: LABPT:2,INR:2 in the last 72 hours She has excellent sensation to the elbow wrist and forearm she has good refill good pulse and excellent motor function. Her shoulder is stable there is no signs of compartment syndrome. She is alert her lower stem examination is benign there's no signs of DVT infection or vascular compromise.  Her abdomen is nontender stable and without complications. Her chest is clear, heart regular rate. HEENT is within normal limits. She is actually doing quite well on her incentive spirometer and her lung fields are clear  today. Neurologically intact Sensation intact distally Intact pulses distally No cellulitis present Compartment soft  Assessment/Plan: 1 Day Post-Op Procedure(s)  (LRB): SHOULDER HEMI-ARTHROPLASTY (Left) Continue antibiotics. We will cover her with Cipro given her pulmonary issues. I would recommend continued pain management. We discussed this. Overall she looks very well given the significant surgery she had. She is generally stable and has no complications. She is voiding well lung fields are stable and will have her out of bed today. The patient I discussed all issues at length. I would expect a 2-3 day hospitalization for postop pain control antibiotics and other measures. Will continue close observatory care.  Cassidi Modesitt III,Treyshawn Muldrew M 01/02/2012, 10:10 AM

## 2012-01-03 ENCOUNTER — Inpatient Hospital Stay (HOSPITAL_COMMUNITY): Payer: Medicare Other

## 2012-01-03 LAB — CBC WITH DIFFERENTIAL/PLATELET
Basophils Absolute: 0 10*3/uL (ref 0.0–0.1)
Lymphocytes Relative: 12 % (ref 12–46)
Neutro Abs: 8.1 10*3/uL — ABNORMAL HIGH (ref 1.7–7.7)
Platelets: 220 10*3/uL (ref 150–400)
RDW: 14.8 % (ref 11.5–15.5)
WBC: 10.4 10*3/uL (ref 4.0–10.5)

## 2012-01-03 LAB — BASIC METABOLIC PANEL
CO2: 30 mEq/L (ref 19–32)
Calcium: 8.3 mg/dL — ABNORMAL LOW (ref 8.4–10.5)
Chloride: 94 mEq/L — ABNORMAL LOW (ref 96–112)
Potassium: 4 mEq/L (ref 3.5–5.1)
Sodium: 131 mEq/L — ABNORMAL LOW (ref 135–145)

## 2012-01-03 LAB — GLUCOSE, CAPILLARY
Glucose-Capillary: 175 mg/dL — ABNORMAL HIGH (ref 70–99)
Glucose-Capillary: 169 mg/dL — ABNORMAL HIGH (ref 70–99)
Glucose-Capillary: 159 mg/dL — ABNORMAL HIGH (ref 70–99)

## 2012-01-03 NOTE — Progress Notes (Signed)
PT Cancellation Note  Treatment cancelled today due to patient's refusal to participate. Pt politely declined PT x 3 today. First attempt pt tired from OT.  Second attempt pt had small child in her room with no one to watch while pt did PT.  Third attempt pt c/o fatigue from having just returned to bed. Acute PT will follow-up tomorrow.     [name] 01/03/2012, 6:19 PM Allan Minotti L. Pistol Kessenich DPT [phone]

## 2012-01-03 NOTE — Progress Notes (Deleted)
Pt had 14 beat run of V Tach at 1609, soon after picc placement.  Pt was asymptomatic, bp was 139/54 heart rate 61.  Ralene Bathe, PA was notified who said to contact [facility].  Dr. Lucia Gaskins notified, he thought VTach was related to picc placement, no further orders received.

## 2012-01-03 NOTE — Progress Notes (Signed)
Subjective: 2 Days Post-Op Procedure(s) (LRB): SHOULDER HEMI-ARTHROPLASTY (Left) Patient reports pain as moderate.  Patient had a better day yesterday. She was able to get to some tremendous pain issues yesterday. She is awake cemented chair. She has no calf pain she moves her feet nicely. She states she is tolerating her diet and had a bowel movement yesterday. Although the pain is improved it is still present. She is still coughing up some degree of sputum to a moderate degree.  She notes no new issues. She actually looks much better than yesterday and feels more comfortable.  Objective: Vital signs in last 24 hours: Temp:  [98.4 F (36.9 C)-101.9 F (38.8 C)] 99 F (37.2 C) (08/26 0512) Pulse Rate:  [74-97] 88  (08/26 0512) Resp:  [18-20] 18  (08/26 0512) BP: (130-161)/(52-67) 161/67 mmHg (08/26 0512) SpO2:  [93 %-99 %] 93 % (08/26 0512)  Intake/Output from previous day: 08/25 0701 - 08/26 0700 In: 240 [P.O.:240] Out: -  Intake/Output this shift:     Basename 01/03/12 0544 12/31/11 1453  HGB 8.4* 11.5*    Basename 01/03/12 0544 12/31/11 1453  WBC 10.4 9.6  RBC 2.75* 3.90  HCT 25.4* 35.4*  PLT 220 265    Basename 01/03/12 0544 12/31/11 1453  NA 131* 136  K 4.0 4.2  CL 94* 97  CO2 30 31  BUN 16 18  CREATININE 0.70 0.67  GLUCOSE 165* 139*  CALCIUM 8.3* 9.5   No results found for this basename: LABPT:2,INR:2 in the last 72 hours Lung fields appear to be clear she has no signs of infection dystrophy or vascular compromise. She is stable about her left upper extremity. She has normal sensation motor function to the hand elbow wrist and forearm left upper extremity. The upper extremities normal lower extremity has no signs of DVT infection or vascular compromise. She has a stable ligamentous appearance to the extremities. Her chest has no wheeze. She is still having some productive sputum at times. She is on Cipro for this. Neurologically intact Neurovascular  intact Sensation intact distally No cellulitis present Compartment soft  Assessment/Plan: 2 Days Post-Op Procedure(s) (LRB): SHOULDER HEMI-ARTHROPLASTY (Left) Up with therapy  She understands that she is not to move her shoulder. She should move her elbow wrist forearm and hand and she is doing already and doing quite well I should note.  I would recommend continued hospitalization for IV antibiotics pain management and other measures. I discussed her I would like for to continue his Cipro after the operation secondary to the productive sputum and we'll obtain a chest x-ray and AP shoulder x-ray today.  I feel she has acute blood loss anemia secondary to the surgery itself and have reviewed her CBC which does show a drop in her hemoglobin.  We will look to discharge date of Tuesday or Thursday. I would recommend home health to see her at home with home PT. This was recommended by therapy department and I would certainly concur.  Patient her family in her stance the plans at length.  Toniette Devera III,Avrian Delfavero M 01/03/2012, 8:03 AM

## 2012-01-03 NOTE — Progress Notes (Signed)
Occupational Therapy Treatment Patient Details Name: [name] MRN: [id number] DOB: 02/22/1941 Today's Date: 01/03/2012 Time: 3086-5784 OT Time Calculation (min): 14 min  OT Assessment / Plan / Recommendation Comments on Treatment Session Pt is aware that she is not to move her L shoulder.  Instructed in AROM of L elbow, wrist, forearm, fingers, sling use. Reviewed hemi techniques for dressing. Pt limited by nausea.    Follow Up Recommendations  Home health OT;Supervision/Assistance - 24 hour    Barriers to Discharge       Equipment Recommendations  3 in 1 bedside comode;Tub/shower bench    Recommendations for Other Services    Frequency Min 2X/week   Plan Discharge plan remains appropriate    Precautions / Restrictions Precautions Precautions: Shoulder;Fall Type of Shoulder Precautions: No shoulder movement, AROM elbow to hand on L. Required Braces or Orthoses: Other Brace/Splint Other Brace/Splint: Sling Restrictions Weight Bearing Restrictions: Yes LUE Weight Bearing: Non weight bearing   Pertinent Vitals/Pain     ADL  Upper Body Dressing: Performed;Minimal assistance Where Assessed - Upper Body Dressing: Unsupported sitting Equipment Used: Gait belt;Other (comment) (sling) ADL Comments: Pt experiencing nausea throughout session, premedicated.  Limited activity to seated grooming, L elbow to hand AROM and sling education.    OT Diagnosis:    OT Problem List:   OT Treatment Interventions:     OT Goals Acute Rehab OT Goals OT Goal Formulation: With patient Time For Goal Achievement: 01/09/12 Potential to Achieve Goals: Good ADL Goals Pt Will Perform Upper Body Bathing: with supervision;Sitting, chair;Sitting, edge of bed Pt Will Perform Upper Body Dressing: with supervision;Sitting, chair;Sitting, bed ADL Goal: Upper Body Dressing - Progress: Progressing toward goals Pt Will Transfer to Toilet: with supervision;Ambulation;3-in-1 Pt Will Perform Tub/Shower  Transfer: Tub transfer;with supervision;Ambulation;Transfer tub bench Miscellaneous OT Goals Miscellaneous OT Goal #1: Pt's husband will be independent in assisting pt don/doff L UE sling. OT Goal: Miscellaneous Goal #1 - Progress: Progressing toward goals Miscellaneous OT Goal #2: Pt will independently perform digit, wrist, elbow AROM. OT Goal: Miscellaneous Goal #2 - Progress: Progressing toward goals  Visit Information  Last OT Received On: 01/03/12 Assistance Needed: +1    Subjective Data      Prior Functioning       Cognition  Overall Cognitive Status: Appears within functional limits for tasks assessed/performed Arousal/Alertness: Awake/alert Orientation Level: Appears intact for tasks assessed Behavior During Session: [facility] for tasks performed    Mobility Bed Mobility Bed Mobility: Not assessed   Exercises General Exercises - Upper Extremity Elbow Flexion: AROM;Left;10 reps;Seated Elbow Extension: AROM;Left;10 reps;Seated Wrist Flexion: AROM;Left;10 reps;Seated Wrist Extension: AROM;Left;10 reps;Seated Digit Composite Flexion: AROM;Left;10 reps;Seated Composite Extension: AROM;Left;10 reps;Seated  Balance    End of Session OT - End of Session Activity Tolerance: Treatment limited secondary to medical complications (Comment) (nausea) Patient left: in chair;with call bell/phone within reach;with family/visitor present  GO     Evern Bio 01/03/2012, 12:55 PM [phone]

## 2012-01-04 ENCOUNTER — Other Ambulatory Visit: Payer: Self-pay | Admitting: Internal Medicine

## 2012-01-04 ENCOUNTER — Encounter (HOSPITAL_COMMUNITY): Payer: Self-pay | Admitting: Orthopedic Surgery

## 2012-01-04 LAB — CBC WITH DIFFERENTIAL/PLATELET
Basophils Absolute: 0 10*3/uL (ref 0.0–0.1)
Basophils Relative: 0 % (ref 0–1)
Eosinophils Absolute: 0.2 10*3/uL (ref 0.0–0.7)
Eosinophils Relative: 2 % (ref 0–5)
MCH: 29.5 pg (ref 26.0–34.0)
MCHC: 32.1 g/dL (ref 30.0–36.0)
MCV: 92 fL (ref 78.0–100.0)
Monocytes Absolute: 0.7 10*3/uL (ref 0.1–1.0)
Platelets: 205 10*3/uL (ref 150–400)
RDW: 14.8 % (ref 11.5–15.5)
WBC: 10.4 10*3/uL (ref 4.0–10.5)

## 2012-01-04 LAB — GLUCOSE, CAPILLARY: Glucose-Capillary: 170 mg/dL — ABNORMAL HIGH (ref 70–99)

## 2012-01-04 MED ORDER — FERROUS SULFATE 325 (65 FE) MG PO TABS
325.0000 mg | ORAL_TABLET | Freq: Two times a day (BID) | ORAL | Status: DC
  Administered 2012-01-04 – 2012-01-05 (×2): 325 mg via ORAL
  Filled 2012-01-04 (×4): qty 1

## 2012-01-04 MED ORDER — SITAGLIPTIN PHOSPHATE 100 MG PO TABS: 100.0000 mg | ORAL_TABLET | Freq: Every day | ORAL | Status: DC

## 2012-01-04 NOTE — Progress Notes (Signed)
Occupational Therapy Treatment Patient Details Name: [name] MRN: [id number] DOB: 30-Jan-1941 Today's Date: 01/04/2012 Time: 5188-4166 OT Time Calculation (min): 35 min  OT Assessment / Plan / Recommendation Comments on Treatment Session Excellent ADL session. Pt wants to explore technique for donning wig. Pt states she has had many questions answered during this session. will see again for ther ex for LUE.     Follow Up Recommendations  Home health OT;Supervision/Assistance - 24 hour    Barriers to Discharge   none    Equipment Recommendations  3 in 1 bedside comode;Tub/shower bench    Recommendations for Other Services  none  Frequency Min 3X/week   Plan Discharge plan remains appropriate    Precautions / Restrictions Precautions Precautions: Shoulder;Fall Type of Shoulder Precautions: No shoulder movement, AROM elbow to hand on L. Precaution Booklet Issued: Yes (comment) Required Braces or Orthoses: Other Brace/Splint Other Brace/Splint: Sling Restrictions LUE Weight Bearing: Non weight bearing   Pertinent Vitals/Pain 3    ADL  Upper Body Bathing: Performed;Minimal assistance Where Assessed - Upper Body Bathing: Supported sitting Lower Body Bathing: Simulated;Minimal assistance;Other (comment) (educated on use of long handled sponge for bathing) Upper Body Dressing: Performed;Minimal assistance Where Assessed - Upper Body Dressing: Unsupported sitting Toilet Transfer: Performed;Minimal assistance Toilet Transfer Method: Sit to stand Toilet Transfer Equipment: Bedside commode Toileting - Clothing Manipulation and Hygiene: Performed;Minimal assistance Where Assessed - Engineer, mining and Hygiene: Standing Tub/Shower Transfer: Performed;Minimal assistance Tub/Shower Transfer Method: Science writer: Counsellor Used: Gait belt;Long-handled sponge;Reacher;Rolling walker Transfers/Ambulation Related to  ADLs: Min A ADL Comments: Educating pt on compensatory techniiques for ADL.    OT Diagnosis:    OT Problem List:   OT Treatment Interventions:     OT Goals Acute Rehab OT Goals OT Goal Formulation: With patient Time For Goal Achievement: 01/09/12 Potential to Achieve Goals: Good ADL Goals Pt Will Perform Upper Body Bathing: with supervision;Sitting, chair;Sitting, edge of bed ADL Goal: Upper Body Bathing - Progress: Progressing toward goals Pt Will Perform Upper Body Dressing: with supervision;Sitting, chair;Sitting, bed ADL Goal: Upper Body Dressing - Progress: Progressing toward goals Pt Will Transfer to Toilet: with supervision;Ambulation;3-in-1 ADL Goal: Toilet Transfer - Progress: Progressing toward goals Pt Will Perform Tub/Shower Transfer: Tub transfer;with supervision;Ambulation;Transfer tub bench ADL Goal: Tub/Shower Transfer - Progress: Progressing toward goals  Visit Information  Last OT Received On: 01/04/12    Subjective Data      Prior Functioning       Cognition  Overall Cognitive Status: Appears within functional limits for tasks assessed/performed Arousal/Alertness: Awake/alert Orientation Level: Appears intact for tasks assessed Behavior During Session: [facility] for tasks performed    Mobility  Shoulder Instructions Transfers Transfers: Sit to Stand;Stand to Sit Sit to Stand: 4: Min assist;With upper extremity assist;From chair/3-in-1 Stand to Sit: 4: Min assist;With upper extremity assist;To chair/3-in-1 Details for Transfer Assistance: tactile cues to reach back with non operated arm and to control descent       Exercises      Balance  min A. Post lean at times   End of Session OT - End of Session Equipment Utilized During Treatment: Gait belt Activity Tolerance: Patient tolerated treatment well Patient left: in chair;with call bell/phone within reach;with family/visitor present Nurse Communication: Mobility status;Precautions  GO      Velta Rockholt,HILLARY 01/04/2012, 4:57 PM [facility], OTR/L  [phone] 01/04/2012

## 2012-01-04 NOTE — Progress Notes (Signed)
Subjective: 3 Days Post-Op Procedure(s) (LRB): SHOULDER HEMI-ARTHROPLASTY (Left) Patient reports pain as improved with her current medication regime. She is tolerating pain medications without difficulties. She denies any current nausea, vomiting, diarrhea, fevers, chills, shortness of breath, productive cough she is tolerating a regular diet at this juncture and voiding without difficulties. She had a bowel movement yesterday without difficulties. Is not yet have an opportunity to work with therapy and will certainly need to do so before discharge. In addition, I should note her hemoglobin today is 7.7, she does have postoperative anemia present but currently is not symptomatic as she does not feel overly fatigue dizzy or short of breath. In fact today she states she fell the day she is felt since her admit. She denies any history of anemia in the past.  Objective: Vital signs in last 24 hours: Temp:  [98.4 F (36.9 C)-99.9 F (37.7 C)] 98.4 F (36.9 C) (08/27 0549) Pulse Rate:  [94-109] 99  (08/27 0549) Resp:  [18-20] 20  (08/27 0549) BP: (133-153)/(54-60) 151/54 mmHg (08/27 0549) SpO2:  [91 %-94 %] 94 % (08/27 0549)  Intake/Output from previous day: 08/26 0701 - 08/27 0700 In: 240 [P.O.:240] Out: -  Intake/Output this shift: Total I/O In: 480 [P.O.:480] Out: -    Basename 01/04/12 0502 01/03/12 0544  HGB 7.7* 8.4*    Basename 01/04/12 0502 01/03/12 0544  WBC 10.4 10.4  RBC 2.61* 2.75*  HCT 24.0* 25.4*  PLT 205 220    Basename 01/03/12 0544  NA 131*  K 4.0  CL 94*  CO2 30  BUN 16  CREATININE 0.70  GLUCOSE 165*  CALCIUM 8.3*   No results found for this basename: LABPT:2,INR:2 in the last 72 hours  Physical examination  Patient is awake, alert and oriented, sitting up in a chair, eating breakfast without difficulties. She is very pleasant and in good spirits today.  Head atraumatic normocephalic  Chest is clear to auscultation bilaterally she has no wheezing or  rhonchi present, she denies productive cough at this juncture as noted at time of her admit.  Heart S1-S2 Abdomen nontender bowel sounds are positive  Left upper extremity shows she has excellent range of motion of the digits wrist and elbow neurovascularly she is intact, dressings are intact, clean and dry, she has no signs of cellulitis or postop infection at this juncture her sling is adjusted to fit more appropriately  Assessment/Plan: 3 Days Post-Op Procedure(s) (LRB): SHOULDER HEMI-ARTHROPLASTY (Left) Acute blood loss postoperatively, hemoglobin 7.7 admit hemoglobin 11.5, patient is asymptomatic  DIABETES MELLITUS, TYPE II HYPERLIPIDEMIA ANEMIA, B12 DEFICIENCY INSOMNIA, CHRONIC RESTLESS LEG SYNDROME, MILD NEUROPATHY, IDIOPATHIC PERIPHERAL NEC GERD FATTY LIVER DISEASE MENOPAUSAL SYNDROME OSTEOPOROSIS ABDOMINAL TENDERNESS, RIGHT UPPER QUADRANT COMPRESSION FRACTURE, LUMBAR VERTEBRAE UNS ADVRS EFF UNS RX MEDICINAL&BIOLOGICAL SBSTNC BREAST CANCER, HX OF Foot tendinitis  Discussed with the patient at length today the need for occupational and physical therapy as she is not therapeutic endeavors as of yet. To insure her gait is appropriate as she's had prior lower extremity difficulties with her foot, one make sure she is stable from a therapy standpoint. In addition, I discussed with her her low blood count she is currently asymptomatic I discussed with her options of close observation versus transfusion, she prefers to avoid a transfusion if possible. We'll continue to watch her hemoglobin daily and in addition we'll begin iron supplementation today. We will continue pain management oral antibiotics implemented therapeutic endeavors and potentially discharge her tomorrow if she looks well. We  discussed all issues with her at length. Spiros Greenfeld L 01/04/2012, 8:12 AM

## 2012-01-04 NOTE — Progress Notes (Signed)
Occupational Therapy Treatment Patient Details Name: [name] MRN: [id number] DOB: January 27, 1941 Today's Date: 01/04/2012 Time: 9147-8295 OT Time Calculation (min): 30 min  OT Assessment / Plan / Recommendation Comments on Treatment Session Pt able to demonstrate AROM LUE with the exception of shoulder. Able to verbalize technique for B/D. Will see again in am prior to  D/C. Written information reviewed. Able to verbalized learned techniques from earlier session this am.    Follow Up Recommendations  Home health OT;Supervision/Assistance - 24 hour    Barriers to Discharge   none    Equipment Recommendations  3 in 1 bedside comode;Tub/shower bench    Recommendations for Other Services  none  Frequency Min 3X/week   Plan Discharge plan remains appropriate    Precautions / Restrictions Precautions Precautions: Shoulder;Fall Type of Shoulder Precautions: No shoulder movement, AROM elbow to hand on L. Precaution Booklet Issued: Yes (comment) Required Braces or Orthoses: Other Brace/Splint Other Brace/Splint: Sling Restrictions LUE Weight Bearing: Non weight bearing   Pertinent Vitals/Pain 4    ADL  Upper Body Bathing: Performed;Minimal assistance Where Assessed - Upper Body Bathing: Supported sitting Lower Body Bathing: Simulated;Minimal assistance;Other (comment) (educated on use of long handled sponge for bathing) Upper Body Dressing: Performed;Minimal assistance Where Assessed - Upper Body Dressing: Unsupported sitting Toilet Transfer: Performed;Minimal assistance Toilet Transfer Method: Sit to stand Toilet Transfer Equipment: Bedside commode Toileting - Clothing Manipulation and Hygiene: Performed;Minimal assistance Where Assessed - Engineer, mining and Hygiene: Standing Tub/Shower Transfer: Performed;Minimal assistance Tub/Shower Transfer Method: Science writer: Counsellor Used: Gait belt;Long-handled  sponge;Reacher;Rolling walker Transfers/Ambulation Related to ADLs: Min A ADL Comments: pt/husband taught how to donn/doff sling. Reviewed with pt that she was not allowed any shoulder ROM. Discussed protocol with PA, who states it was OK to use pendulum type movement for B/D. (Educated on proper positioning of LUE in chair and in standi)    OT Diagnosis:    OT Problem List:   OT Treatment Interventions:     OT Goals Acute Rehab OT Goals OT Goal Formulation: With patient Time For Goal Achievement: 01/09/12 Potential to Achieve Goals: Good ADL Goals Pt Will Perform Upper Body Bathing: with supervision;Sitting, chair;Sitting, edge of bed ADL Goal: Upper Body Bathing - Progress: Progressing toward goals Pt Will Perform Upper Body Dressing: with supervision;Sitting, chair;Sitting, bed ADL Goal: Upper Body Dressing - Progress: Progressing toward goals Pt Will Transfer to Toilet: with supervision;Ambulation;3-in-1 ADL Goal: Toilet Transfer - Progress: Progressing toward goals Pt Will Perform Tub/Shower Transfer: Tub transfer;with supervision;Ambulation;Transfer tub bench ADL Goal: Tub/Shower Transfer - Progress: Progressing toward goals Miscellaneous OT Goals Miscellaneous OT Goal #1: Pt's husband will be independent in assisting pt don/doff L UE sling. OT Goal: Miscellaneous Goal #1 - Progress: Progressing toward goals Miscellaneous OT Goal #2: Pt will independently perform digit, wrist, elbow AROM. OT Goal: Miscellaneous Goal #2 - Progress: Progressing toward goals  Visit Information  Last OT Received On: 01/04/12    Subjective Data      Prior Functioning       Cognition  Overall Cognitive Status: Appears within functional limits for tasks assessed/performed Arousal/Alertness: Awake/alert Orientation Level: Appears intact for tasks assessed Behavior During Session: [facility] for tasks performed    Mobility  Shoulder Instructions Transfers Transfers: Sit to Stand;Stand to Sit Sit  to Stand: 4: Min assist;From chair/3-in-1 Stand to Sit: 4: Min assist;To chair/3-in-1 Details for Transfer Assistance: tactile cues to reach back with non operated arm and to control descent  Exercises  General Exercises - Upper Extremity Elbow Flexion: AROM;Left;10 reps;Standing Elbow Extension: AROM;Left;10 reps;Standing Wrist Flexion: AROM;Left;10 reps;Seated Wrist Extension: AROM;Left;10 reps;Seated Digit Composite Flexion: AROM;Left;10 reps;Seated;Squeeze ball Composite Extension: AROM;Left;10 reps;Seated   Balance  min A   End of Session OT - End of Session Equipment Utilized During Treatment: Gait belt Activity Tolerance: Patient tolerated treatment well Patient left: in chair;with call bell/phone within reach;with family/visitor present Nurse Communication: Mobility status;Precautions  GO     Joel Mericle,HILLARY 01/04/2012, 5:04 PM [facility], OTR/L  [phone] 01/04/2012

## 2012-01-04 NOTE — Progress Notes (Signed)
CARE MANAGEMENT NOTE 01/04/2012  Patient:  [name], [name]   Account Number:  [id number]  Date Initiated:  01/04/2012  Documentation initiated by:  Vance Peper  Subjective/Objective Assessment:   71 yr old female s/p Left shoulder hemiarthroplasty     Action/Plan:   CM spoke with patient and husbandconcerning home health, choice offered.   Anticipated DC Date:  01/05/2012   Anticipated DC Plan:  HOME W HOME HEALTH SERVICES      DC Planning Services  CM consult      PAC Choice  DURABLE MEDICAL EQUIPMENT  HOME HEALTH   Choice offered to / List presented to:  C-1 Patient   DME arranged  3-N-1  TUB BENCH      DME agency  [facility]     HH arranged  HH-2 PT  HH-3 OT      [facility] agency  [facility]   Status of service:  Completed, signed off Medicare Important Message given?   (If response is "NO", the following Medicare IM given date fields will be blank) Date Medicare IM given:   Date Additional Medicare IM given:    Discharge Disposition:  HOME W HOME HEALTH SERVICES  Per UR Regulation:    If discussed at [name] Length of Stay Meetings, dates discussed:    Comments:

## 2012-01-04 NOTE — Progress Notes (Signed)
Physical Therapy Treatment Patient Details Name: [name] MRN: [id number] DOB: [birth year] Today's Date: 01/04/2012 Time: 0900-0930 PT Time Calculation (min): 30 min  PT Assessment / Plan / Recommendation Comments on Treatment Session  Pt showing improvement with use of SPC. Pt able to increase ambulation distance and perform stair negotiation with assistance from pt's husband.      Follow Up Recommendations  Home health PT;Supervision/Assistance - 24 hour    Barriers to Discharge        Equipment Recommendations  3 in 1 bedside comode;Tub/shower bench    Recommendations for Other Services    Frequency Min 6X/week   Plan Discharge plan remains appropriate;Frequency remains appropriate    Precautions / Restrictions Precautions Precautions: Shoulder;Fall Type of Shoulder Precautions: No shoulder movement, AROM elbow to hand on L. Precaution Booklet Issued: Yes (comment) Required Braces or Orthoses: Other Brace/Splint Other Brace/Splint: Sling Restrictions Weight Bearing Restrictions: Yes LUE Weight Bearing: Non weight bearing   Pertinent Vitals/Pain No c/o pain    Mobility  Bed Mobility Bed Mobility: Not assessed (Pt plans to sleep in recliner at home) Transfers Transfers: Sit to Stand;Stand to Sit Sit to Stand: 4: Min assist;From chair/3-in-1;With armrests Stand to Sit: 4: Min guard;To chair/3-in-1 Details for Transfer Assistance: (A) to initiate transfer with cues for hand placement.  Pt's husband able to assist with transfer. Ambulation/Gait Ambulation/Gait Assistance: 4: Min guard;5: Supervision Ambulation Distance (Feet): 100 Feet Assistive device: Straight cane Ambulation/Gait Assistance Details: Minguard for safety progressing to supervision.  Pt with correct gait sequence with use of SPC. Gait Pattern: Decreased step length - right;Decreased step length - left Stairs: Yes Stairs Assistance: 4: Min assist;4: Min guard Stairs Assistance Details (indicate  cue type and reason): (A) to maintain balance. Pt's husband able to assist.  Pt performed stairs x 2:  sideways with left rail, forward with SPC.   Stair Management Technique: One rail Left;Sideways;Forwards;With cane (performed x 2) Number of Stairs: 3     Exercises     PT Diagnosis:    PT Problem List:   PT Treatment Interventions:     PT Goals Acute Rehab PT Goals PT Goal Formulation: With patient Time For Goal Achievement: 01/16/12 Potential to Achieve Goals: Good Pt will go Sit to Stand: with min assist PT Goal: Sit to Stand - Progress: Partly met Pt will go Stand to Sit: with min assist PT Goal: Stand to Sit - Progress: Partly met Pt will Ambulate: 51 - 150 feet;with min assist;with cane PT Goal: Ambulate - Progress: Partly met Pt will Go Up / Down Stairs: 3-5 stairs;with min assist;with rail(s) PT Goal: Up/Down Stairs - Progress: Partly met  Visit Information  Last PT Received On: 01/04/12 Assistance Needed: +1    Subjective Data      Cognition  Overall Cognitive Status: Appears within functional limits for tasks assessed/performed Arousal/Alertness: Awake/alert Orientation Level: Appears intact for tasks assessed Behavior During Session: [facility] for tasks performed    Balance     End of Session PT - End of Session Equipment Utilized During Treatment: Gait belt;Other (comment) (LUE sling) Activity Tolerance: Patient tolerated treatment well Patient left: in chair;with call bell/phone within reach;with family/visitor present Nurse Communication: Mobility status   GP     Anis Degidio 01/04/2012, 9:44 AM Jake Shark, PT DPT [phone]

## 2012-01-04 NOTE — Telephone Encounter (Signed)
Pt needs samples of januvia. Pt in [place]  dx shoulder replacement. If samples not avail [facility].

## 2012-01-04 NOTE — Progress Notes (Signed)
UR COMPLETED  

## 2012-01-04 NOTE — Telephone Encounter (Signed)
No samples available- sent to pharmacy a nd husb and informed

## 2012-01-05 LAB — CBC
Platelets: 244 10*3/uL (ref 150–400)
RBC: 2.76 MIL/uL — ABNORMAL LOW (ref 3.87–5.11)
RDW: 15.2 % (ref 11.5–15.5)
WBC: 7.8 10*3/uL (ref 4.0–10.5)

## 2012-01-05 LAB — GLUCOSE, CAPILLARY: Glucose-Capillary: 99 mg/dL (ref 70–99)

## 2012-01-05 MED ORDER — HYDROMORPHONE HCL 2 MG PO TABS: 2.0000 mg | ORAL_TABLET | ORAL | Status: AC | PRN

## 2012-01-05 MED ORDER — ALPRAZOLAM 0.5 MG PO TABS: 0.5000 mg | ORAL_TABLET | Freq: Four times a day (QID) | ORAL | Status: AC | PRN

## 2012-01-05 MED ORDER — METHOCARBAMOL 500 MG PO TABS: 500.0000 mg | ORAL_TABLET | Freq: Four times a day (QID) | ORAL | Status: AC | PRN

## 2012-01-05 NOTE — Discharge Summary (Signed)
Physician Discharge Summary  Patient ID: [name] MRN: [id number] DOB/AGE: 1940-12-04 71 y.o.  Admit date: 01/01/2012 Discharge date: 01/05/2012  Admission Diagnoses: Status post comminuted, four-part, left humeral head fracture   DIABETES MELLITUS, TYPE II  HYPERLIPIDEMIA  ANEMIA, B12 DEFICIENCY  INSOMNIA, CHRONIC  RESTLESS LEG SYNDROME, MILD  NEUROPATHY, IDIOPATHIC PERIPHERAL NEC  GERD  FATTY LIVER DISEASE  MENOPAUSAL SYNDROME  OSTEOPOROSIS  ABDOMINAL TENDERNESS, RIGHT UPPER QUADRANT  COMPRESSION FRACTURE, LUMBAR VERTEBRAE  UNS ADVRS EFF UNS RX MEDICINAL&BIOLOGICAL SBSTNC  BREAST CANCER, HX OF  Foot tendinitis      Discharge Diagnoses:  status post left shoulder hemiarthroplasty  Acute blood loss secondary to postoperative status  See above admission diagnosis    Discharged Condition: Stable, improved   Hospital Course: the patient is a very pleasant 71 year old female who is admitted to [facility] for a highly comminuted four-part left humeral head fracture. We discussed with her these issues and a recommendation for surgical intervention. The patient was admitted on 8/24, to undergo the above mentioned procedure. Laboratory data and preoperative chest x-ray were stable. The patient underwent a left shoulder hemiarthroplasty on 08/24, she tolerated the procedure quite well there was no complications intraoperatively. Please see operative report for full details.  The patient was admitted to the orthopedic unit with orders for IV pain management, therapeutic endeavors, close observation. It was noted preoperatively that she had had a mild productive cough however she did denied fevers or chills at the time. Thus, in addition to her vancomycin, she was given Cipro for pulmonary coverage. She had no pulmonary complications her cough resolved nicely maintained the sats quite well. Her hospital course was uncomplicated. Her pain was currently controlled with the pain  medications, she tolerated therapy well. It was noted that she did have acute blood loss secondary to her surgery her hemoglobin was noted to stabilize on the day of discharge as she was started on iron supplementation. The patient, however, it was asymptomatic in regards to her postoperative anemia. She tolerated regular diet quite nicely, she was voiding without difficulties, she was noted to have bowel movements during her hospital stay. On postoperative day #4 she was improved overall she stated that her pain was well controlled with the pain medications. She felt improved overall having more energy and, she denied any nausea, fever, chills, shortness of breath, or productive cough. The patient was noted to have a chronic left ankle tendinopathy with surgical endeavors tentatively planned, she noted some mild swelling during her hospital course of the left ankle. She had no calf tenderness or signs of DVT . On postop day 4, patient is doing quite well and was eager for discharge. We've discussed with her at length wound care, utilizing her sling at all times except if she is in sedentary position she can take from out of the sling and place it on up the with the forearm over her stomach which is a safe position. We discussed with her at length wound care, showers versus baths and all issues. We'll discharge her home in stable condition .  Consults: None    Discharge Exam: Blood pressure 121/53, pulse 95, temperature 98.2 F (36.8 C), temperature source Oral, resp. rate 18, SpO2 97.00%. glucose 106, hemoglobin 8.4  The patient is pleasant, alert and oriented, up in a chair today. Her husband accompanies her   head atraumatic normocephalic  Chest has equal bilateral expansion she has no wheezing or rhonchi present  Abdomen soft nontender  Left  upper extremity shows that her wound is clean and intact no signs of infection addition she has mild postoperative swelling and ecchymosis about the shoulder  not unexpected. Neurovascularly she is intact. Lower extremity evaluation shows she has no signs of DVT tenderness she has swelling about the medial aspect of her ankle which is chronic and intermittent in nature given chronic tendinopathy  Disposition: 01-Home or Self Care  Discharge Orders    Future Appointments: Provider: Department: Dept Phone: Center:   03/21/2012 11:00 AM Stacie Glaze, MD [facility] [phone] [facility]     Future Orders Please Complete By Expires   Diet - low sodium heart healthy      Call MD / Call 911      Comments:   If you experience chest pain or shortness of breath, CALL 911 and be transported to the hospital emergency room.  If you develope a fever above 101 F, pus (white drainage) or increased drainage or redness at the wound, or calf pain, call your surgeon's office.   Constipation Prevention      Comments:   Drink plenty of fluids.  Prune juice may be helpful.  You may use a stool softener, such as Colace (over the counter) 100 mg twice a day.  Use MiraLax (over the counter) for constipation as needed.   Increase activity slowly as tolerated      Discharge instructions      Comments:   Marland KitchenMarland KitchenKeep bandage clean and dry.  Call for any problems.  No smoking.  Criteria for driving a car: you should be off your pain medicine for 7-8 hours, able to drive one handed(confident), thinking clearly and feeling able in your judgement to drive. Continue elevation as it will decrease swelling.  If instructed by MD move your fingers within the confines of the bandage/splint.  Use ice if instructed by your MD. Call immediately for any sudden loss of feeling in your hand/arm or change in functional abilities of the extremity. [name].We recommend that you to take vitamin C 1000 mg a day to promote healing we also recommend that if you require her pain medicine that he take a stool softener to prevent constipation as most pain medicines will have constipation side effects. We  recommend either Peri-Colace or Senokot and recommend that you also consider adding MiraLAX to prevent the constipation affects from pain medicine if you are required to use them. These medicines are over the counter and maybe purchased at a local pharmacy. Keep incision clean and dry, wear sling at all times, except when awake and sitting you may rest arm on pillow across your lap.     Medication List  As of 01/05/2012  2:12 PM   TAKE these medications         Alpha-Lipoic Acid 600 MG Caps   Take 1 capsule by mouth daily.      ALPRAZolam 0.5 MG tablet   Commonly known as: XANAX   Take 1 tablet (0.5 mg total) by mouth every 6 (six) hours as needed for sleep.      aspirin 81 MG tablet   Take 81 mg by mouth. Daily Monday through Saturday      aspirin 325 MG tablet   Take 325 mg by mouth once a week. Sunday only      atenolol 25 MG tablet   Commonly known as: TENORMIN   Take 1 tablet (25 mg total) by mouth daily.      bimatoprost 0.03 % ophthalmic solution  Commonly known as: LUMIGAN   Place 1 drop into both eyes at bedtime.      brinzolamide 1 % ophthalmic suspension   Commonly known as: AZOPT   Place 1 drop into both eyes 2 (two) times daily.      CENTRUM SILVER PO   Take 1 tablet by mouth daily.      cholecalciferol 1000 UNITS tablet   Commonly known as: VITAMIN D   Take 1,000 Units by mouth daily.      CITRUCEL 500 MG Tabs   Generic drug: Methylcellulose (Laxative)   Take 2 tablets by mouth daily after supper.      co-enzyme Q-10 50 MG capsule   Take 150 mg by mouth daily.      cyanocobalamin 1000 MCG/ML injection   Commonly known as: (VITAMIN B-12)   Inject 1,000 mcg into the muscle every 21 ( twenty-one) days. Last dose 1/25      Ester-C 500-60 MG Tabs   Take 1 tablet by mouth daily.      ESTRING 2 MG vaginal ring   Generic drug: estradiol   Place 2 mg vaginally every 3 (three) months. follow package directions      Fish Oil 1200 MG Caps   Take 1 capsule  by mouth 2 (two) times daily.      Flax Seed Oil 1000 MG Caps   Take 1 capsule by mouth daily.      FLUoxetine 20 MG capsule   Commonly known as: PROZAC   TAKE ONE CAPSULE BY MOUTH EVERY DAY      glimepiride 4 MG tablet   Commonly known as: AMARYL   TAKE ONE TABLET BY MOUTH EVERY DAY      Grape Seed Extract 100 MG Caps   Take 150 mg by mouth daily.      HYDROmorphone 2 MG tablet   Commonly known as: DILAUDID   Take 1 tablet (2 mg total) by mouth every 3 (three) hours as needed.      magnesium gluconate 500 MG tablet   Commonly known as: MAGONATE   Take 500 mg by mouth at bedtime.      meloxicam 15 MG tablet   Commonly known as: MOBIC   Take 1 tablet (15 mg total) by mouth daily.      methocarbamol 500 MG tablet   Commonly known as: ROBAXIN   Take 1 tablet (500 mg total) by mouth every 6 (six) hours as needed.      pramipexole 0.5 MG tablet   Commonly known as: MIRAPEX   TAKE ONE TABLET BY MOUTH AT BEDTIME      PRODRIN 500-130-20 MG Tabs   Generic drug: APAP-Isometheptene-Caffeine   Take 2 tablets by mouth daily as needed. At the onset of migraine      Selenium 200 MCG Caps   Take 200 mcg by mouth daily.      sitaGLIPtin 100 MG tablet   Commonly known as: JANUVIA   Take 1 tablet (100 mg total) by mouth daily.         ASK your doctor about these medications         LUNESTA PO   Take 5 mg by mouth at bedtime. lunesta 5mg            Follow-up Information    Follow up with Karen Chafe, MD. Schedule an appointment as soon as possible for a visit in 1 week. (call for an appt questions or concerns)    Contact  information:   [address] [place] [address] [phone]          Signed: Sheran Lawless 01/05/2012, 2:12 PM

## 2012-01-05 NOTE — Progress Notes (Signed)
Occupational Therapy Treatment Patient Details Name: BRITANNI YARDE MRN: [id number] DOB: 1940/10/07 Today's Date: 01/05/2012 Time: 9629-5284 OT Time Calculation (min): 35 min  OT Assessment / Plan / Recommendation Comments on Treatment Session Pt has met all OT acute goals. Pt will benefit from continued OT [facility] services    Follow Up Recommendations  Home health OT;Supervision/Assistance - 24 hour    Barriers to Discharge       Equipment Recommendations  3 in 1 bedside comode;Tub/shower bench    Recommendations for Other Services    Frequency Min 3X/week   Plan Discharge plan remains appropriate    Precautions / Restrictions Precautions Precautions: Shoulder;Fall Type of Shoulder Precautions: No shoulder movement, AROM elbow to hand on L. Required Braces or Orthoses: Other Brace/Splint Other Brace/Splint: Sling Restrictions Weight Bearing Restrictions: Yes LUE Weight Bearing: Non weight bearing   Pertinent Vitals/Pain 4    ADL  Grooming: Performed;Wash/dry hands;Teeth care;Brushing hair;Supervision/safety;Other (comment) (compensatory technique for donning/doffing wig 1 handed) Where Assessed - Grooming: Unsupported sitting;Unsupported standing Upper Body Bathing: Performed;Set up Where Assessed - Upper Body Bathing: Unsupported sitting Upper Body Dressing: Performed;Minimal assistance Where Assessed - Upper Body Dressing: Unsupported sitting;Unsupported standing Lower Body Dressing: Performed;Supervision/safety Where Assessed - Lower Body Dressing: Unsupported sit to stand;Other (comment) (with reacher) Toilet Transfer: Performed;Min Pension scheme manager Method: Sit to Barista: Comfort height toilet Transfers/Ambulation Related to ADLs: min Guard ADL Comments: Pt using compensatory techniques for grooming/bathing/dressing    OT Diagnosis:    OT Problem List:   OT Treatment Interventions:     OT Goals Acute Rehab OT Goals OT Goal  Formulation: With patient Time For Goal Achievement: 01/09/12 Potential to Achieve Goals: Good ADL Goals Pt Will Perform Upper Body Bathing: with supervision;Sitting, chair;Sitting, edge of bed ADL Goal: Upper Body Bathing - Progress: Progressing toward goals Pt Will Perform Upper Body Dressing: with supervision;Sitting, chair;Sitting, bed ADL Goal: Upper Body Dressing - Progress: Progressing toward goals Pt Will Transfer to Toilet: with supervision;Ambulation;3-in-1 ADL Goal: Toilet Transfer - Progress: Met Pt Will Perform Tub/Shower Transfer: Tub transfer;with supervision;Ambulation;Transfer tub bench ADL Goal: Tub/Shower Transfer - Progress: Met Miscellaneous OT Goals Miscellaneous OT Goal #1: Pt's husband will be independent in assisting pt don/doff L UE sling. OT Goal: Miscellaneous Goal #1 - Progress: Met Miscellaneous OT Goal #2: Pt will independently perform digit, wrist, elbow AROM. OT Goal: Miscellaneous Goal #2 - Progress: Met  Visit Information  Last OT Received On: 01/05/12 Assistance Needed: +1    Subjective Data      Prior Functioning       Cognition  Overall Cognitive Status: Appears within functional limits for tasks assessed/performed Arousal/Alertness: Awake/alert Orientation Level: Appears intact for tasks assessed Behavior During Session: [facility] for tasks performed    Mobility  Shoulder Instructions Bed Mobility Bed Mobility: Not assessed Transfers Sit to Stand: 6: Modified independent (Device/Increase time);From chair/3-in-1;With upper extremity assist;With armrests Stand to Sit: 6: Modified independent (Device/Increase time);With upper extremity assist;With armrests;To chair/3-in-1 Details for Transfer Assistance: increased time to complete task       Exercises  General Exercises - Upper Extremity Elbow Flexion: AROM;Left;10 reps;Standing Elbow Extension: AROM;Left;10 reps;Standing Wrist Flexion: AROM;Left;10 reps;Seated Wrist Extension:  AROM;Left;10 reps;Seated Digit Composite Flexion: AROM;Left;10 reps;Seated;Squeeze ball Composite Extension: AROM;Left;10 reps;Seated   Balance     End of Session OT - End of Session Activity Tolerance: Patient tolerated treatment well Patient left: Other (comment) (with PT) Nurse Communication: Mobility status;Precautions  GO     [facility] 01/05/2012,  2:41 PM [facility], OTR/L  [phone] 01/05/2012

## 2012-01-05 NOTE — Clinical Documentation Improvement (Signed)
Abnormal Labs Clarification  THIS DOCUMENT IS NOT A PERMANENT PART OF THE MEDICAL RECORD  TO RESPOND TO THE THIS QUERY, FOLLOW THE INSTRUCTIONS BELOW:  1. If needed, update documentation for the patient's encounter via the notes activity.  2. Access this query again and click edit on the [employer].  3. After updating, or not, click F2 to complete all highlighted (required) fields concerning your review. Select "additional documentation in the medical record" OR "no additional documentation provided".  4. Click Sign note button.  5. The deficiency will fall out of your InBasket *Please let us know if you are not able to complete this workflow by phone or e-mail (listed below).  Please update your documentation within the medical record to reflect your response to this query.                                                                                   01/05/12  Dear [facility]  In a better effort to capture your patient's severity of illness, reflect appropriate length of stay and utilization of resources, a review of the medical record has revealed the following indicators.    Based on your clinical judgment, please clarify and document in a progress note and/or discharge summary the clinical condition associated with the following supporting information:  In responding to this query please exercise your independent judgment.  The fact that a query is asked, does not imply that any particular answer is desired or expected.  Abnormal findings (laboratory, x-ray, pathologic, and other diagnostic results) are not coded and reported unless the physician indicates their clinical significance.   The medical record reflects the following clinical findings, please clarify the diagnostic and/or clinical significance:       Noted NA level down to 131 ( 8/26 ) from 136 (8/21 ), patient continues on IVF @ 75 ml/hr.  Please document secondary diagnosis if appropriate.  Thank  you   Possible Clinical Conditions?                                   Hyponatremia  Hyposmolality  Abnormal lab finding  Other Condition  Cannot Clinically Determine     Evaluation: BMET   IVF: LR @ 75 ml/ hr    Reviewed:  no additional documentation provided Pleas Koch for [facility] on 01/12/12   Thank You,  Leonette Most Addison  Clinical Documentation Specialist RN, BSN:  Pager [phone] HIM off # [phone]  Health Information Management [place]

## 2012-01-05 NOTE — Progress Notes (Signed)
Physical Therapy Treatment Patient Details Name: [name] MRN: [id number] DOB: 04-Jan-1941 Today's Date: 01/05/2012 Time: 8413-2440 PT Time Calculation (min): 12 min  PT Assessment / Plan / Recommendation Comments on Treatment Session  Pt for d/c today per pt.  Pt able to increase distance and at supervision level.      Follow Up Recommendations  Home health PT;Supervision/Assistance - 24 hour    Barriers to Discharge        Equipment Recommendations  3 in 1 bedside comode;Tub/shower bench    Recommendations for Other Services    Frequency Min 6X/week   Plan Discharge plan remains appropriate;Frequency remains appropriate    Precautions / Restrictions Precautions Precautions: Shoulder;Fall Type of Shoulder Precautions: No shoulder movement, AROM elbow to hand on L. Required Braces or Orthoses: Other Brace/Splint Other Brace/Splint: Sling Restrictions Weight Bearing Restrictions: Yes LUE Weight Bearing: Non weight bearing   Pertinent Vitals/Pain No c/o during treatment.  Premedicated.    Mobility  Bed Mobility Bed Mobility: Not assessed Transfers Transfers: Sit to Stand;Stand to Sit Sit to Stand: 6: Modified independent (Device/Increase time);From chair/3-in-1;With upper extremity assist;With armrests Stand to Sit: 6: Modified independent (Device/Increase time);With upper extremity assist;With armrests;To chair/3-in-1 Details for Transfer Assistance: increased time to complete task Ambulation/Gait Ambulation/Gait Assistance: 5: Supervision Ambulation Distance (Feet): 150 Feet Assistive device: Straight cane Ambulation/Gait Assistance Details: No assist required for balance.  Supervision for safety.  Gait improved. Gait Pattern: Decreased step length - right;Decreased step length - left Stairs: No    PT Goals Acute Rehab PT Goals PT Goal: Sit to Stand - Progress: Met PT Goal: Stand to Sit - Progress: Met PT Goal: Ambulate - Progress: Met  Visit Information  Last PT Received On: 01/05/12 Assistance Needed: +1    Subjective Data      Cognition  Overall Cognitive Status: Appears within functional limits for tasks assessed/performed Arousal/Alertness: Awake/alert Orientation Level: Appears intact for tasks assessed Behavior During Session: [facility] for tasks performed    Balance     End of Session PT - End of Session Equipment Utilized During Treatment: Other (comment) (sling) Activity Tolerance: Patient tolerated treatment well Patient left: in chair;with call bell/phone within reach;with family/visitor present Nurse Communication: Mobility status   Newell Coral 01/05/2012, 12:32 PM  Newell Coral, PTA [phone]

## 2012-01-06 ENCOUNTER — Ambulatory Visit: Payer: Medicare Other | Admitting: Internal Medicine

## 2012-01-12 DIAGNOSIS — S42293A Other displaced fracture of upper end of unspecified humerus, initial encounter for closed fracture: Secondary | ICD-10-CM | POA: Diagnosis not present

## 2012-01-13 DIAGNOSIS — L821 Other seborrheic keratosis: Secondary | ICD-10-CM | POA: Diagnosis not present

## 2012-01-13 DIAGNOSIS — L608 Other nail disorders: Secondary | ICD-10-CM | POA: Diagnosis not present

## 2012-01-13 DIAGNOSIS — L84 Corns and callosities: Secondary | ICD-10-CM | POA: Diagnosis not present

## 2012-01-13 DIAGNOSIS — E1159 Type 2 diabetes mellitus with other circulatory complications: Secondary | ICD-10-CM | POA: Diagnosis not present

## 2012-01-13 DIAGNOSIS — D239 Other benign neoplasm of skin, unspecified: Secondary | ICD-10-CM | POA: Diagnosis not present

## 2012-01-13 DIAGNOSIS — I739 Peripheral vascular disease, unspecified: Secondary | ICD-10-CM | POA: Diagnosis not present

## 2012-01-28 DIAGNOSIS — S42293A Other displaced fracture of upper end of unspecified humerus, initial encounter for closed fracture: Secondary | ICD-10-CM | POA: Diagnosis not present

## 2012-02-03 DIAGNOSIS — H409 Unspecified glaucoma: Secondary | ICD-10-CM | POA: Diagnosis not present

## 2012-02-03 DIAGNOSIS — H4011X0 Primary open-angle glaucoma, stage unspecified: Secondary | ICD-10-CM | POA: Diagnosis not present

## 2012-02-07 ENCOUNTER — Encounter: Payer: Self-pay | Admitting: Internal Medicine

## 2012-02-07 ENCOUNTER — Ambulatory Visit (INDEPENDENT_AMBULATORY_CARE_PROVIDER_SITE_OTHER): Payer: Medicare Other | Admitting: Internal Medicine

## 2012-02-07 VITALS — BP 146/70 | HR 76 | Temp 98.6°F | Resp 16 | Ht 72.0 in | Wt 241.0 lb

## 2012-02-07 DIAGNOSIS — IMO0002 Reserved for concepts with insufficient information to code with codable children: Secondary | ICD-10-CM | POA: Diagnosis not present

## 2012-02-07 DIAGNOSIS — E1169 Type 2 diabetes mellitus with other specified complication: Secondary | ICD-10-CM

## 2012-02-07 DIAGNOSIS — Z23 Encounter for immunization: Secondary | ICD-10-CM

## 2012-02-07 DIAGNOSIS — E538 Deficiency of other specified B group vitamins: Secondary | ICD-10-CM | POA: Diagnosis not present

## 2012-02-07 DIAGNOSIS — D500 Iron deficiency anemia secondary to blood loss (chronic): Secondary | ICD-10-CM

## 2012-02-07 DIAGNOSIS — E1165 Type 2 diabetes mellitus with hyperglycemia: Secondary | ICD-10-CM | POA: Diagnosis not present

## 2012-02-07 LAB — CBC WITH DIFFERENTIAL/PLATELET
Basophils Absolute: 0 10*3/uL (ref 0.0–0.1)
Basophils Relative: 0.5 % (ref 0.0–3.0)
Eosinophils Relative: 3.4 % (ref 0.0–5.0)
HCT: 37.2 % (ref 36.0–46.0)
Hemoglobin: 11.9 g/dL — ABNORMAL LOW (ref 12.0–15.0)
Lymphocytes Relative: 19.7 % (ref 12.0–46.0)
Lymphs Abs: 1.5 10*3/uL (ref 0.7–4.0)
Monocytes Relative: 6.3 % (ref 3.0–12.0)
Neutro Abs: 5.3 10*3/uL (ref 1.4–7.7)
RBC: 4.01 Mil/uL (ref 3.87–5.11)
RDW: 14.9 % — ABNORMAL HIGH (ref 11.5–14.6)

## 2012-02-07 LAB — IRON: Iron: 62 ug/dL (ref 42–145)

## 2012-02-07 MED ORDER — CYANOCOBALAMIN 1000 MCG/ML IJ SOLN
1000.0000 ug | INTRAMUSCULAR | Status: DC
  Administered 2012-02-07: 1000 ug via INTRAMUSCULAR

## 2012-02-07 NOTE — Patient Instructions (Signed)
The patient is instructed to continue all medications as prescribed. Schedule followup with check out clerk upon leaving the clinic  

## 2012-02-07 NOTE — Progress Notes (Signed)
  Subjective:    Patient ID: [name], female    DOB: 19-Sep-1940, 71 y.o.   MRN: [id number]  HPI Pt has a fall and was seen by orthopedics.... Fall with use of a surgical boot on August 17 and has surgery on August 24th. Lost 3 units of blood and was placed on iron. Has not had follow up CBC and iron Has been out to the [place] and it is now off her formulary   Review of Systems  Constitutional: Negative for activity change, appetite change and fatigue.  HENT: Negative for ear pain, congestion, neck pain, postnasal drip and sinus pressure.   Eyes: Negative for redness and visual disturbance.  Respiratory: Negative for cough, shortness of breath and wheezing.   Gastrointestinal: Negative for abdominal pain and abdominal distention.  Genitourinary: Negative for dysuria, frequency and menstrual problem.  Musculoskeletal: Negative for myalgias, joint swelling and arthralgias.  Skin: Negative for rash and wound.  Neurological: Negative for dizziness, weakness and headaches.  Hematological: Negative for adenopathy. Does not bruise/bleed easily.  Psychiatric/Behavioral: Negative for disturbed wake/sleep cycle and decreased concentration.       Objective:   Physical Exam  Nursing note and vitals reviewed. Constitutional: She is oriented to person, place, and time. She appears well-developed and well-nourished. No distress.  HENT:  Head: Normocephalic and atraumatic.  Left Ear: External ear normal.  Eyes: Pupils are equal, round, and reactive to light.  Neck: Normal range of motion. Neck supple. No JVD present. No tracheal deviation present. No thyromegaly present.  Cardiovascular: Normal rate and regular rhythm.   Murmur heard. Pulmonary/Chest: Effort normal and breath sounds normal. She has no wheezes. She exhibits no tenderness.  Abdominal: Soft. Bowel sounds are normal.  Musculoskeletal: She exhibits edema and tenderness.  Lymphadenopathy:    She has no cervical adenopathy.    Neurological: She is alert and oriented to person, place, and time. She has normal reflexes. No cranial nerve deficit.  Skin: Skin is warm and dry. She is not diaphoretic.  Psychiatric: She has a normal mood and affect. Her behavior is normal.          Assessment & Plan:  The patient lost blood following surgery has not been monitored for iron and CBC seen she got out of the hospital The A1c prior to surgery on August 24 was 6.5 she has tolerated Januvia well and even though it is not on her formulary for next year we will continue Januvia at the beginning of the year which time we can assess formulary alternatives.

## 2012-02-07 NOTE — Progress Notes (Signed)
  Subjective:    Patient ID: [name], female    DOB: 1940-09-10, 71 y.o.   MRN: [id number]  HPI Patient presents to discuss osteoarthritic pain and hypertension she has been on Mobic and has not noticed any increased inflammatory control of her arthritic pain.  Patient's blood pressure is stable   Review of Systems  Constitutional: Negative for activity change, appetite change and fatigue.  HENT: Negative for ear pain, congestion, neck pain, postnasal drip and sinus pressure.   Eyes: Negative for redness and visual disturbance.  Respiratory: Negative for cough, shortness of breath and wheezing.   Gastrointestinal: Negative for abdominal pain and abdominal distention.  Genitourinary: Negative for dysuria, frequency and menstrual problem.  Musculoskeletal: Positive for myalgias, joint swelling and gait problem. Negative for arthralgias.  Skin: Negative for rash and wound.  Neurological: Negative for dizziness, weakness and headaches.  Hematological: Negative for adenopathy. Does not bruise/bleed easily.  Psychiatric/Behavioral: Negative for disturbed wake/sleep cycle and decreased concentration.       Objective:   Physical Exam  Constitutional: She is oriented to person, place, and time. She appears well-developed and well-nourished.  Cardiovascular: Normal rate and regular rhythm.   Pulmonary/Chest: Effort normal and breath sounds normal.  Abdominal: Soft. Bowel sounds are normal.  Musculoskeletal: She exhibits edema and tenderness.  Neurological: She is alert and oriented to person, place, and time.          Assessment & Plan:  Incomplete response to Mobic as an anti-inflammatory for her arthritic pain.  Will increase Mobic to 7.5-50 mg by mouth daily and monitor affect

## 2012-02-08 DIAGNOSIS — S42293A Other displaced fracture of upper end of unspecified humerus, initial encounter for closed fracture: Secondary | ICD-10-CM | POA: Diagnosis not present

## 2012-02-10 DIAGNOSIS — N952 Postmenopausal atrophic vaginitis: Secondary | ICD-10-CM | POA: Diagnosis not present

## 2012-02-11 DIAGNOSIS — S42293A Other displaced fracture of upper end of unspecified humerus, initial encounter for closed fracture: Secondary | ICD-10-CM | POA: Diagnosis not present

## 2012-02-14 DIAGNOSIS — S42293A Other displaced fracture of upper end of unspecified humerus, initial encounter for closed fracture: Secondary | ICD-10-CM | POA: Diagnosis not present

## 2012-02-17 DIAGNOSIS — S42293A Other displaced fracture of upper end of unspecified humerus, initial encounter for closed fracture: Secondary | ICD-10-CM | POA: Diagnosis not present

## 2012-02-18 DIAGNOSIS — S42293A Other displaced fracture of upper end of unspecified humerus, initial encounter for closed fracture: Secondary | ICD-10-CM | POA: Diagnosis not present

## 2012-02-22 ENCOUNTER — Ambulatory Visit (INDEPENDENT_AMBULATORY_CARE_PROVIDER_SITE_OTHER): Payer: Medicare Other | Admitting: Internal Medicine

## 2012-02-22 DIAGNOSIS — E538 Deficiency of other specified B group vitamins: Secondary | ICD-10-CM | POA: Diagnosis not present

## 2012-02-22 MED ORDER — CYANOCOBALAMIN 1000 MCG/ML IJ SOLN
1000.0000 ug | Freq: Once | INTRAMUSCULAR | Status: AC
  Administered 2012-02-22: 1000 ug via INTRAMUSCULAR

## 2012-02-23 DIAGNOSIS — S42293A Other displaced fracture of upper end of unspecified humerus, initial encounter for closed fracture: Secondary | ICD-10-CM | POA: Diagnosis not present

## 2012-02-25 DIAGNOSIS — S42293A Other displaced fracture of upper end of unspecified humerus, initial encounter for closed fracture: Secondary | ICD-10-CM | POA: Diagnosis not present

## 2012-02-29 DIAGNOSIS — S42293A Other displaced fracture of upper end of unspecified humerus, initial encounter for closed fracture: Secondary | ICD-10-CM | POA: Diagnosis not present

## 2012-03-01 DIAGNOSIS — B373 Candidiasis of vulva and vagina: Secondary | ICD-10-CM | POA: Diagnosis not present

## 2012-03-01 DIAGNOSIS — B3731 Acute candidiasis of vulva and vagina: Secondary | ICD-10-CM | POA: Diagnosis not present

## 2012-03-03 DIAGNOSIS — S42293A Other displaced fracture of upper end of unspecified humerus, initial encounter for closed fracture: Secondary | ICD-10-CM | POA: Diagnosis not present

## 2012-03-06 DIAGNOSIS — S42293A Other displaced fracture of upper end of unspecified humerus, initial encounter for closed fracture: Secondary | ICD-10-CM | POA: Diagnosis not present

## 2012-03-07 ENCOUNTER — Other Ambulatory Visit: Payer: Self-pay | Admitting: Internal Medicine

## 2012-03-09 DIAGNOSIS — L259 Unspecified contact dermatitis, unspecified cause: Secondary | ICD-10-CM | POA: Diagnosis not present

## 2012-03-13 ENCOUNTER — Other Ambulatory Visit: Payer: Self-pay | Admitting: Internal Medicine

## 2012-03-13 ENCOUNTER — Ambulatory Visit (INDEPENDENT_AMBULATORY_CARE_PROVIDER_SITE_OTHER): Payer: Medicare Other | Admitting: Internal Medicine

## 2012-03-13 DIAGNOSIS — H66009 Acute suppurative otitis media without spontaneous rupture of ear drum, unspecified ear: Secondary | ICD-10-CM | POA: Diagnosis not present

## 2012-03-13 DIAGNOSIS — D518 Other vitamin B12 deficiency anemias: Secondary | ICD-10-CM | POA: Diagnosis not present

## 2012-03-13 DIAGNOSIS — H6980 Other specified disorders of Eustachian tube, unspecified ear: Secondary | ICD-10-CM | POA: Diagnosis not present

## 2012-03-13 DIAGNOSIS — D519 Vitamin B12 deficiency anemia, unspecified: Secondary | ICD-10-CM

## 2012-03-13 DIAGNOSIS — Z23 Encounter for immunization: Secondary | ICD-10-CM

## 2012-03-13 MED ORDER — CYANOCOBALAMIN 1000 MCG/ML IJ SOLN
1000.0000 ug | INTRAMUSCULAR | Status: AC
  Administered 2012-03-13: 1000 ug via INTRAMUSCULAR

## 2012-03-20 DIAGNOSIS — S42293A Other displaced fracture of upper end of unspecified humerus, initial encounter for closed fracture: Secondary | ICD-10-CM | POA: Diagnosis not present

## 2012-03-21 ENCOUNTER — Ambulatory Visit: Payer: Medicare Other | Admitting: Internal Medicine

## 2012-03-23 DIAGNOSIS — L259 Unspecified contact dermatitis, unspecified cause: Secondary | ICD-10-CM | POA: Diagnosis not present

## 2012-03-23 DIAGNOSIS — S42293A Other displaced fracture of upper end of unspecified humerus, initial encounter for closed fracture: Secondary | ICD-10-CM | POA: Diagnosis not present

## 2012-03-24 ENCOUNTER — Ambulatory Visit (INDEPENDENT_AMBULATORY_CARE_PROVIDER_SITE_OTHER): Payer: Medicare Other | Admitting: Family

## 2012-03-24 ENCOUNTER — Encounter: Payer: Self-pay | Admitting: Family

## 2012-03-24 ENCOUNTER — Telehealth: Payer: Self-pay | Admitting: Internal Medicine

## 2012-03-24 VITALS — BP 140/70 | HR 92 | Temp 98.8°F | Wt 255.0 lb

## 2012-03-24 DIAGNOSIS — R1013 Epigastric pain: Secondary | ICD-10-CM

## 2012-03-24 DIAGNOSIS — E538 Deficiency of other specified B group vitamins: Secondary | ICD-10-CM | POA: Diagnosis not present

## 2012-03-24 DIAGNOSIS — K219 Gastro-esophageal reflux disease without esophagitis: Secondary | ICD-10-CM

## 2012-03-24 LAB — BASIC METABOLIC PANEL
BUN: 19 mg/dL (ref 6–23)
CO2: 29 mEq/L (ref 19–32)
Calcium: 9.3 mg/dL (ref 8.4–10.5)
Chloride: 101 mEq/L (ref 96–112)
Creatinine, Ser: 0.8 mg/dL (ref 0.4–1.2)
Glucose, Bld: 205 mg/dL — ABNORMAL HIGH (ref 70–99)

## 2012-03-24 LAB — CBC WITH DIFFERENTIAL/PLATELET
Basophils Absolute: 0 10*3/uL (ref 0.0–0.1)
Basophils Relative: 0.3 % (ref 0.0–3.0)
Eosinophils Absolute: 0.1 10*3/uL (ref 0.0–0.7)
Hemoglobin: 12.5 g/dL (ref 12.0–15.0)
Lymphocytes Relative: 10.5 % — ABNORMAL LOW (ref 12.0–46.0)
MCHC: 32.4 g/dL (ref 30.0–36.0)
Monocytes Relative: 8.2 % (ref 3.0–12.0)
Neutro Abs: 9.5 10*3/uL — ABNORMAL HIGH (ref 1.4–7.7)
Neutrophils Relative %: 79.9 % — ABNORMAL HIGH (ref 43.0–77.0)
RBC: 4.33 Mil/uL (ref 3.87–5.11)

## 2012-03-24 LAB — HEPATIC FUNCTION PANEL
ALT: 20 U/L (ref 0–35)
Albumin: 3.8 g/dL (ref 3.5–5.2)
Bilirubin, Direct: 0.1 mg/dL (ref 0.0–0.3)
Total Protein: 7.4 g/dL (ref 6.0–8.3)

## 2012-03-24 MED ORDER — CYANOCOBALAMIN 1000 MCG/ML IJ SOLN
1000.0000 ug | Freq: Once | INTRAMUSCULAR | Status: AC
  Administered 2012-03-24: 1000 ug via INTRAMUSCULAR

## 2012-03-24 MED ORDER — DOXYCYCLINE HYCLATE 100 MG PO TABS: 100.0000 mg | ORAL_TABLET | Freq: Two times a day (BID) | ORAL | Status: DC

## 2012-03-24 MED ORDER — OMEPRAZOLE 20 MG PO CPDR: 20.0000 mg | DELAYED_RELEASE_CAPSULE | Freq: Every day | ORAL | Status: DC

## 2012-03-24 NOTE — Telephone Encounter (Signed)
No follow up required, closing note.

## 2012-03-24 NOTE — Patient Instructions (Addendum)
Diet for Gastroesophageal Reflux Disease, Adult  Reflux (acid reflux) is when acid from your stomach flows up into the esophagus. When acid comes in contact with the esophagus, the acid causes irritation and soreness (inflammation) in the esophagus. When reflux happens often or so severely that it causes damage to the esophagus, it is called gastroesophageal reflux disease (GERD). Nutrition therapy can help ease the discomfort of GERD.  FOODS OR DRINKS TO AVOID OR LIMIT   Smoking or chewing tobacco. Nicotine is one of the most potent stimulants to acid production in the gastrointestinal tract.   Caffeinated and decaffeinated coffee and black tea.   Regular or low-calorie carbonated beverages or energy drinks (caffeine-free carbonated beverages are allowed).    Strong spices, such as black pepper, white pepper, red pepper, cayenne, curry powder, and chili powder.   Peppermint or spearmint.   Chocolate.   High-fat foods, including meats and fried foods. Extra added fats including oils, butter, salad dressings, and nuts. Limit these to less than 8 tsp per day.   Fruits and vegetables if they are not tolerated, such as citrus fruits or tomatoes.   Alcohol.   Any food that seems to aggravate your condition.  If you have questions regarding your diet, call your caregiver or a registered dietitian.  OTHER THINGS THAT MAY HELP GERD INCLUDE:    Eating your meals slowly, in a relaxed setting.   Eating 5 to 6 small meals per day instead of 3 large meals.   Eliminating food for a period of time if it causes distress.   Not lying down until 3 hours after eating a meal.   Keeping the head of your bed raised 6 to 9 inches (15 to 23 cm) by using a foam wedge or blocks under the legs of the bed. Lying flat may make symptoms worse.   Being physically active. Weight loss may be helpful in reducing reflux in overweight or obese adults.   Wear loose fitting clothing  EXAMPLE MEAL PLAN  This meal plan is approximately  2,000 calories based on [employer] meal planning guidelines.  Breakfast    cup cooked oatmeal.   1 cup strawberries.   1 cup low-fat milk.   1 oz almonds.  Snack   1 cup cucumber slices.   6 oz yogurt (made from low-fat or fat-free milk).  Lunch   2 slice whole-wheat bread.   2 oz sliced turkey.   2 tsp mayonnaise.   1 cup blueberries.   1 cup snap peas.  Snack   6 whole-wheat crackers.   1 oz string cheese.  Dinner    cup Bugge rice.   1 cup mixed veggies.   1 tsp olive oil.   3 oz grilled fish.  Document Released: 04/26/2005 Document Revised: 07/19/2011 Document Reviewed: 03/12/2011  [employer] Patient Information 2013 [employer].

## 2012-03-24 NOTE — Progress Notes (Signed)
Subjective:    Patient ID: [name], female    DOB: 08/14/1940, 71 y.o.   MRN: [id number]  HPI  71 year old white female, nonsmoker, is in today with c/o abdominal discomfort x 4 days off and on. She has also had nausea but no vomiting. Reports an increase in gas. Has moderate caffeine intake and has been eating more chocolate since halloween. Has a h/o gastritis in the past. No currently taking meds. No alcohol use. Reports having fever 102 only at bedtime, relieved by tylenol.   Review of Systems  Constitutional: Positive for fever. Negative for chills, appetite change, fatigue and unexpected weight change.  HENT: Negative.   Respiratory: Negative.   Cardiovascular: Negative.   Gastrointestinal: Positive for abdominal pain. Negative for diarrhea, constipation and anal bleeding.  Genitourinary: Negative.   Musculoskeletal: Negative.   Skin: Negative.   Neurological: Negative.   Hematological: Negative.   Psychiatric/Behavioral: Negative.    Past Medical History  Diagnosis Date  . Hx of breast cancer   . Osteoporosis   . Shingles   . Neuropathy   . AC (acromioclavicular) joint bone spurs   . Diverticulitis   . Heartburn   . Headache     Migraines  . Hammer toe   . PONV (postoperative nausea and vomiting)   . Hypertension     takes Atenolol daily  . History of bronchitis   . Pneumonia     history of;many yrs ago  . History of migraine     last one about 6-65months ago;Prodrin prn  . Joint pain   . Arthritis     right knee;injections every 6months   . Scoliosis   . Sensitive skin     sees a dermatoligist yrly  . IBS (irritable bowel syndrome)   . Vitamin D deficiency     takes Vit d daily  . Diabetes mellitus     takes Amaryl and Januvia daily  . Early cataracts, bilateral   . Glaucoma   . Cancer     hx of breast ca    History   Social History  . Marital Status: Married    Spouse Name: N/A    Number of Children: N/A  . Years of Education: N/A    Occupational History  . Not on file.   Social History Main Topics  . Smoking status: Never Smoker   . Smokeless tobacco: Not on file  . Alcohol Use: No  . Drug Use: No  . Sexually Active: Not Currently   Other Topics Concern  . Not on file   Social History Narrative  . No narrative on file    Past Surgical History  Procedure Date  . Cholecystectomy 06/2008  . Tonsillectomy   . Thyroidectomy     26yrs ago  . Mastectomy 1994, 1995  . Laparoscopic oophorectomy   . Laparotomy   . Diagnostic laparoscopy   . Adenoidectomy     at age 39  . Excision removed from neck   . Hysteroscopy   . Colonoscopy   . Shoulder hemi-arthroplasty 01/01/2012    Procedure: SHOULDER HEMI-ARTHROPLASTY;  Surgeon: Dominica Severin, MD;  Location: [facility];  Service: Orthopedics;  Laterality: Left;  Left Shoulder Hemi Arthroplasty with Repair and Reconstruction as Necessary     Family History  Problem Relation Age of Onset  . Arthritis Mother   . COPD Father   . Heart disease Father   . Hypertension Father   . Hyperlipidemia Father  Allergies  Allergen Reactions  . Cephalexin Other (See Comments)    Patient can't remember reaction  . Erythromycin Ethylsuccinate Hives and Diarrhea  . Nitrofurantoin Other (See Comments)    Severe headache  . Percocet (Oxycodone-Acetaminophen) Itching    Current Outpatient Prescriptions on File Prior to Visit  Medication Sig Dispense Refill  . Alpha-Lipoic Acid 600 MG CAPS Take 1 capsule by mouth daily.      [name] APAP-Isometheptene-Caffeine (PRODRIN) 500-130-20 MG TABS Take 2 tablets by mouth daily as needed. At the onset of migraine      . aspirin 325 MG tablet Take 325 mg by mouth once a week. Sunday only      . aspirin 81 MG tablet Take 81 mg by mouth. Daily Monday through Saturday      . atenolol (TENORMIN) 25 MG tablet Take 1 tablet (25 mg total) by mouth daily.  90 tablet  3  . bimatoprost (LUMIGAN) 0.03 % ophthalmic drops Place 1 drop into both eyes at  bedtime.        . brinzolamide (AZOPT) 1 % ophthalmic suspension Place 1 drop into both eyes 2 (two) times daily.      . cholecalciferol (VITAMIN D) 1000 UNITS tablet Take 1,000 Units by mouth daily.      [name] co-enzyme Q-10 50 MG capsule Take 150 mg by mouth daily.      . cyanocobalamin (,VITAMIN B-12,) 1000 MCG/ML injection Inject 1,000 mcg into the muscle every 21 ( twenty-one) days. Last dose 1/25      . estradiol (ESTRING) 2 MG vaginal ring Place 2 mg vaginally every 3 (three) months. follow package directions       . ESZOPICLONE 3 MG tablet TAKE ONE TABLET BY MOUTH AT BEDTIME AS NEEDED FOR SLEEP  30 tablet  2  . Flaxseed, Linseed, (FLAX SEED OIL) 1000 MG CAPS Take 1 capsule by mouth daily.       [name] FLUoxetine (PROZAC) 20 MG capsule TAKE ONE CAPSULE BY MOUTH EVERY DAY  30 capsule  6  . glimepiride (AMARYL) 4 MG tablet TAKE ONE TABLET BY MOUTH EVERY DAY  30 tablet  11  . Grape Seed Extract 100 MG CAPS Take 150 mg by mouth daily.       . magnesium gluconate (MAGONATE) 500 MG tablet Take 500 mg by mouth at bedtime.        . meloxicam (MOBIC) 15 MG tablet Take 1 tablet (15 mg total) by mouth daily.  14 tablet  0  . Methylcellulose, Laxative, (CITRUCEL) 500 MG TABS Take 2 tablets by mouth daily after supper.      . Multiple Vitamins-Minerals (CENTRUM SILVER PO) Take 1 tablet by mouth daily.       . Omega-3 Fatty Acids (FISH OIL) 1200 MG CAPS Take 1 capsule by mouth 2 (two) times daily.      . pramipexole (MIRAPEX) 0.5 MG tablet TAKE ONE TABLET BY MOUTH AT BEDTIME  30 tablet  11  . Selenium 200 MCG CAPS Take 200 mcg by mouth daily.      . sitaGLIPtin (JANUVIA) 100 MG tablet Take 1 tablet (100 mg total) by mouth daily.  90 tablet  3  . Vitamin Mixture (ESTER-C) 500-60 MG TABS Take 1 tablet by mouth daily.      [name] omeprazole (PRILOSEC) 20 MG capsule Take 1 capsule (20 mg total) by mouth daily.  30 capsule  3   No current facility-administered medications on file prior to visit.    BP  140/70  Pulse  92  Temp 98.8 F (37.1 C) (Oral)  Wt 255 lb (115.667 kg)  SpO2 93%chart    Objective:   Physical Exam  Constitutional: She is oriented to person, place, and time. She appears well-developed and well-nourished.  Neck: Normal range of motion. Neck supple.  Cardiovascular: Normal rate, regular rhythm and normal heart sounds.   Pulmonary/Chest: Effort normal and breath sounds normal.  Abdominal: Soft. There is tenderness. There is no rebound and no guarding.  Musculoskeletal: Normal range of motion.  Neurological: She is alert and oriented to person, place, and time.  Skin: Skin is warm and dry.  Psychiatric: She has a normal mood and affect.          Assessment & Plan:  Assessment: Abdominal Pain, GERD  Plan: Omeprazole 20mg  once a day. GERD friendly diet. Labs sent. Call the office if symptoms worsen or persist. Recheck as scheduled and sooner as needed.

## 2012-03-24 NOTE — Telephone Encounter (Signed)
Caller: [name]/Patient; Patient Name: [name]; PCP: Darryll Capers (Adults only); Best Callback Phone Number: [phone] Onset- 03/20/12 Temp- 103  Pt c/o fever at night since Monday. She feels nauseated on/off. She has toe on right foot that is swollen and painful. Emergent s/s of Foot non-injury and Diabetes protocol r/o. Pt to see provider within 4hrs. Appointment scheduled today with Orvan Falconer, [place]. at 10:45am.

## 2012-03-27 ENCOUNTER — Other Ambulatory Visit: Payer: Self-pay | Admitting: Family

## 2012-03-27 DIAGNOSIS — L089 Local infection of the skin and subcutaneous tissue, unspecified: Secondary | ICD-10-CM | POA: Diagnosis not present

## 2012-03-27 DIAGNOSIS — Z4789 Encounter for other orthopedic aftercare: Secondary | ICD-10-CM | POA: Diagnosis not present

## 2012-03-27 DIAGNOSIS — L03039 Cellulitis of unspecified toe: Secondary | ICD-10-CM | POA: Diagnosis not present

## 2012-03-27 DIAGNOSIS — H2510 Age-related nuclear cataract, unspecified eye: Secondary | ICD-10-CM | POA: Diagnosis not present

## 2012-03-29 DIAGNOSIS — L03039 Cellulitis of unspecified toe: Secondary | ICD-10-CM | POA: Diagnosis not present

## 2012-04-10 ENCOUNTER — Other Ambulatory Visit: Payer: Self-pay | Admitting: Internal Medicine

## 2012-04-10 DIAGNOSIS — L84 Corns and callosities: Secondary | ICD-10-CM | POA: Diagnosis not present

## 2012-04-10 DIAGNOSIS — S42293A Other displaced fracture of upper end of unspecified humerus, initial encounter for closed fracture: Secondary | ICD-10-CM | POA: Diagnosis not present

## 2012-04-10 DIAGNOSIS — I739 Peripheral vascular disease, unspecified: Secondary | ICD-10-CM | POA: Diagnosis not present

## 2012-04-10 DIAGNOSIS — M2040 Other hammer toe(s) (acquired), unspecified foot: Secondary | ICD-10-CM | POA: Diagnosis not present

## 2012-04-10 DIAGNOSIS — E1159 Type 2 diabetes mellitus with other circulatory complications: Secondary | ICD-10-CM | POA: Diagnosis not present

## 2012-04-12 ENCOUNTER — Ambulatory Visit (INDEPENDENT_AMBULATORY_CARE_PROVIDER_SITE_OTHER): Payer: Medicare Other | Admitting: Internal Medicine

## 2012-04-12 VITALS — BP 146/84 | HR 76 | Temp 98.2°F | Resp 16 | Ht 72.0 in | Wt 245.0 lb

## 2012-04-12 DIAGNOSIS — D500 Iron deficiency anemia secondary to blood loss (chronic): Secondary | ICD-10-CM

## 2012-04-12 DIAGNOSIS — D519 Vitamin B12 deficiency anemia, unspecified: Secondary | ICD-10-CM

## 2012-04-12 DIAGNOSIS — E1165 Type 2 diabetes mellitus with hyperglycemia: Secondary | ICD-10-CM | POA: Diagnosis not present

## 2012-04-12 DIAGNOSIS — M2040 Other hammer toe(s) (acquired), unspecified foot: Secondary | ICD-10-CM

## 2012-04-12 DIAGNOSIS — E1169 Type 2 diabetes mellitus with other specified complication: Secondary | ICD-10-CM | POA: Diagnosis not present

## 2012-04-12 DIAGNOSIS — D518 Other vitamin B12 deficiency anemias: Secondary | ICD-10-CM

## 2012-04-12 DIAGNOSIS — IMO0002 Reserved for concepts with insufficient information to code with codable children: Secondary | ICD-10-CM | POA: Diagnosis not present

## 2012-04-12 LAB — CBC WITH DIFFERENTIAL/PLATELET
Basophils Relative: 0.6 % (ref 0.0–3.0)
Eosinophils Relative: 5.2 % — ABNORMAL HIGH (ref 0.0–5.0)
Hemoglobin: 12 g/dL (ref 12.0–15.0)
Lymphocytes Relative: 24.1 % (ref 12.0–46.0)
MCV: 89.4 fl (ref 78.0–100.0)
Neutro Abs: 3.9 10*3/uL (ref 1.4–7.7)
Neutrophils Relative %: 62.2 % (ref 43.0–77.0)
RBC: 4.12 Mil/uL (ref 3.87–5.11)
WBC: 6.2 10*3/uL (ref 4.5–10.5)

## 2012-04-12 MED ORDER — LIRAGLUTIDE 18 MG/3ML ~~LOC~~ SOLN
1.8000 mg | Freq: Every day | SUBCUTANEOUS | Status: DC
Start: 2012-04-12 — End: 2012-05-23

## 2012-04-12 MED ORDER — CYANOCOBALAMIN 1000 MCG/ML IJ SOLN: 1000.0000 ug | INTRAMUSCULAR | Status: AC

## 2012-04-12 NOTE — Patient Instructions (Addendum)
Inject in abdomin daily as direct  .6 then 1.2 then 1.8 monitor the CBG's and call if they do no drop to below 130 fasting  The patient is instructed to continue all medications as prescribed. Schedule followup with check out clerk upon leaving the clinic

## 2012-04-20 ENCOUNTER — Telehealth: Payer: Self-pay | Admitting: Internal Medicine

## 2012-04-20 NOTE — Telephone Encounter (Signed)
Patient Information:  Caller Name: Prabhjot  Phone: [phone]  Patient: [name], [name]  Gender: Female  DOB: Feb 24, 1941  Age: 71 Years  PCP: Darryll Capers (Adults only)  Office Follow Up:  Does the office need to follow up with this patient?: Yes  Instructions For The Office: Please follow up with patient if any changes need to be made to medication or if advised to come in for evaluation.  RN Note:  No loose stools today only occurs after main meals.  Itching to back is present no rash noted. States has had previous issues with back itching and gastrointestinal issue.  Patient states she is not sure if the itching and loose stools is related to starting the Victosa and Trusopt or if it is related to previous issues.  Patient states that she just wants to bring about awareness since the side effects for these medications are Diarrhea and itching.  Symptoms  Reason For Call & Symptoms: Diarrhea and itching  Reviewed Health History In EMR: Yes  Reviewed Medications In EMR: Yes  Reviewed Allergies In EMR: Yes  Reviewed Surgeries / Procedures: Yes  Date of Onset of Symptoms: 04/13/2012  Guideline(s) Used:  Itching - Localized  Disposition Per Guideline:   Home Care  Reason For Disposition Reached:   Mild localized itching  Advice Given:  Call Back If:  Itching becomes severe  You become worse.

## 2012-04-20 NOTE — Telephone Encounter (Signed)
Give it a week or so and see if it gets better- try imodium for diarrhea

## 2012-04-26 DIAGNOSIS — H60399 Other infective otitis externa, unspecified ear: Secondary | ICD-10-CM | POA: Diagnosis not present

## 2012-04-26 DIAGNOSIS — T169XXA Foreign body in ear, unspecified ear, initial encounter: Secondary | ICD-10-CM | POA: Diagnosis not present

## 2012-04-26 DIAGNOSIS — H6980 Other specified disorders of Eustachian tube, unspecified ear: Secondary | ICD-10-CM | POA: Diagnosis not present

## 2012-04-26 DIAGNOSIS — H7290 Unspecified perforation of tympanic membrane, unspecified ear: Secondary | ICD-10-CM | POA: Diagnosis not present

## 2012-04-28 DIAGNOSIS — M2040 Other hammer toe(s) (acquired), unspecified foot: Secondary | ICD-10-CM | POA: Diagnosis not present

## 2012-04-28 DIAGNOSIS — M76829 Posterior tibial tendinitis, unspecified leg: Secondary | ICD-10-CM | POA: Diagnosis not present

## 2012-04-29 ENCOUNTER — Encounter: Payer: Self-pay | Admitting: Internal Medicine

## 2012-05-01 ENCOUNTER — Ambulatory Visit (INDEPENDENT_AMBULATORY_CARE_PROVIDER_SITE_OTHER): Payer: Medicare Other | Admitting: Internal Medicine

## 2012-05-01 DIAGNOSIS — D518 Other vitamin B12 deficiency anemias: Secondary | ICD-10-CM

## 2012-05-01 MED ORDER — CYANOCOBALAMIN 1000 MCG/ML IJ SOLN
1500.0000 ug | Freq: Once | INTRAMUSCULAR | Status: AC
  Administered 2012-05-01: 1500 ug via INTRAMUSCULAR

## 2012-05-08 ENCOUNTER — Other Ambulatory Visit: Payer: Self-pay | Admitting: Internal Medicine

## 2012-05-08 DIAGNOSIS — H4011X0 Primary open-angle glaucoma, stage unspecified: Secondary | ICD-10-CM | POA: Diagnosis not present

## 2012-05-08 DIAGNOSIS — H409 Unspecified glaucoma: Secondary | ICD-10-CM | POA: Diagnosis not present

## 2012-05-08 DIAGNOSIS — T148XXA Other injury of unspecified body region, initial encounter: Secondary | ICD-10-CM

## 2012-05-08 DIAGNOSIS — M2040 Other hammer toe(s) (acquired), unspecified foot: Secondary | ICD-10-CM

## 2012-05-09 ENCOUNTER — Other Ambulatory Visit: Payer: Self-pay | Admitting: Internal Medicine

## 2012-05-10 ENCOUNTER — Other Ambulatory Visit: Payer: Self-pay | Admitting: Internal Medicine

## 2012-05-10 HISTORY — PX: JOINT REPLACEMENT: SHX530

## 2012-05-11 ENCOUNTER — Other Ambulatory Visit: Payer: Self-pay | Admitting: Internal Medicine

## 2012-05-11 ENCOUNTER — Other Ambulatory Visit: Payer: Self-pay | Admitting: *Deleted

## 2012-05-11 DIAGNOSIS — B373 Candidiasis of vulva and vagina: Secondary | ICD-10-CM | POA: Diagnosis not present

## 2012-05-11 DIAGNOSIS — B3731 Acute candidiasis of vulva and vagina: Secondary | ICD-10-CM | POA: Diagnosis not present

## 2012-05-21 ENCOUNTER — Encounter: Payer: Self-pay | Admitting: Internal Medicine

## 2012-05-21 NOTE — Progress Notes (Signed)
Subjective:    Patient ID: [name], female    DOB: Sep 23, 1940, 72 y.o.   MRN: [id number]  HPI Multiple Musculoskeletal issues including foot surgery Healing complicated by DM poor control Increased obesity GERD flair with pain medications    Review of Systems  Constitutional: Negative for activity change, appetite change and fatigue.       Pale  HENT: Negative for ear pain, congestion, neck pain, postnasal drip and sinus pressure.   Eyes: Negative for redness and visual disturbance.  Respiratory: Negative for cough, shortness of breath and wheezing.   Gastrointestinal: Negative for abdominal pain and abdominal distention.  Genitourinary: Negative for dysuria, frequency and menstrual problem.  Musculoskeletal: Positive for myalgias, joint swelling and gait problem. Negative for arthralgias.  Skin: Negative for rash and wound.  Neurological: Negative for dizziness, weakness and headaches.  Hematological: Negative for adenopathy. Does not bruise/bleed easily.  Psychiatric/Behavioral: Negative for sleep disturbance and decreased concentration.   Past Medical History  Diagnosis Date  . Hx of breast cancer   . Osteoporosis   . Shingles   . Neuropathy   . AC (acromioclavicular) joint bone spurs   . Diverticulitis   . Heartburn   . Headache     Migraines  . Hammer toe   . PONV (postoperative nausea and vomiting)   . Hypertension     takes Atenolol daily  . History of bronchitis   . Pneumonia     history of;many yrs ago  . History of migraine     last one about 6-39months ago;Prodrin prn  . Joint pain   . Arthritis     right knee;injections every 6months   . Scoliosis   . Sensitive skin     sees a dermatoligist yrly  . IBS (irritable bowel syndrome)   . Vitamin D deficiency     takes Vit d daily  . Diabetes mellitus     takes Amaryl and Januvia daily  . Early cataracts, bilateral   . Glaucoma   . Cancer     hx of breast ca    History   Social History  .  Marital Status: Married    Spouse Name: N/A    Number of Children: N/A  . Years of Education: N/A   Occupational History  . Not on file.   Social History Main Topics  . Smoking status: Never Smoker   . Smokeless tobacco: Not on file  . Alcohol Use: No  . Drug Use: No  . Sexually Active: Not Currently   Other Topics Concern  . Not on file   Social History Narrative  . No narrative on file    Past Surgical History  Procedure Date  . Cholecystectomy 06/2008  . Tonsillectomy   . Thyroidectomy     41yrs ago  . Mastectomy 1994, 1995  . Laparoscopic oophorectomy   . Laparotomy   . Diagnostic laparoscopy   . Adenoidectomy     at age 61  . Excision removed from neck   . Hysteroscopy   . Colonoscopy   . Shoulder hemi-arthroplasty 01/01/2012    Procedure: SHOULDER HEMI-ARTHROPLASTY;  Surgeon: Dominica Severin, MD;  Location: [facility] OR;  Service: Orthopedics;  Laterality: Left;  Left Shoulder Hemi Arthroplasty with Repair and Reconstruction as Necessary     Family History  Problem Relation Age of Onset  . Arthritis Mother   . COPD Father   . Heart disease Father   . Hypertension Father   . Hyperlipidemia Father  Allergies  Allergen Reactions  . Cephalexin Other (See Comments)    Patient can't remember reaction  . Erythromycin Ethylsuccinate Hives and Diarrhea  . Nitrofurantoin Other (See Comments)    Severe headache  . Percocet (Oxycodone-Acetaminophen) Itching    Current Outpatient Prescriptions on File Prior to Visit  Medication Sig Dispense Refill  . Alpha-Lipoic Acid 600 MG CAPS Take 1 capsule by mouth daily.      [name] APAP-Isometheptene-Caffeine (PRODRIN) 500-130-20 MG TABS Take 2 tablets by mouth daily as needed. At the onset of migraine      . aspirin 325 MG tablet Take 325 mg by mouth once a week. Sunday only      . aspirin 81 MG tablet Take 81 mg by mouth. Daily Monday through Saturday      . atenolol (TENORMIN) 25 MG tablet Take 1 tablet (25 mg total) by mouth  daily.  90 tablet  3  . bimatoprost (LUMIGAN) 0.03 % ophthalmic drops Place 1 drop into both eyes at bedtime.        . cholecalciferol (VITAMIN D) 1000 UNITS tablet Take 1,000 Units by mouth daily.      [name] co-enzyme Q-10 50 MG capsule Take 150 mg by mouth daily.      . cyanocobalamin (,VITAMIN B-12,) 1000 MCG/ML injection Inject 1,000 mcg into the muscle every 21 ( twenty-one) days. Last dose 1/25      . doxycycline (VIBRA-TABS) 100 MG tablet Take 1 tablet (100 mg total) by mouth 2 (two) times daily.  14 tablet  0  . estradiol (ESTRING) 2 MG vaginal ring Place 2 mg vaginally every 3 (three) months. follow package directions       . Flaxseed, Linseed, (FLAX SEED OIL) 1000 MG CAPS Take 1 capsule by mouth daily.       [name] FLUoxetine (PROZAC) 20 MG capsule TAKE ONE CAPSULE BY MOUTH EVERY DAY  30 capsule  0  . glimepiride (AMARYL) 4 MG tablet TAKE ONE TABLET BY MOUTH EVERY DAY  30 tablet  10  . Grape Seed Extract 100 MG CAPS Take 150 mg by mouth daily.       . Liraglutide (VICTOZA) 18 MG/3ML SOLN Inject 0.3 mLs (1.8 mg total) into the skin daily.  6 mL  11  . magnesium gluconate (MAGONATE) 500 MG tablet Take 500 mg by mouth at bedtime.        . meloxicam (MOBIC) 15 MG tablet Take 1 tablet (15 mg total) by mouth daily.  14 tablet  0  . Methylcellulose, Laxative, (CITRUCEL) 500 MG TABS Take 2 tablets by mouth daily after supper.      . Multiple Vitamins-Minerals (CENTRUM SILVER PO) Take 1 tablet by mouth daily.       . Omega-3 Fatty Acids (FISH OIL) 1200 MG CAPS Take 1 capsule by mouth 2 (two) times daily.      [name] omeprazole (PRILOSEC) 20 MG capsule Take 1 capsule (20 mg total) by mouth daily.  30 capsule  3  . pramipexole (MIRAPEX) 0.5 MG tablet TAKE ONE TABLET BY MOUTH AT BEDTIME  30 tablet  0  . Selenium 200 MCG CAPS Take 200 mcg by mouth daily.      . Vitamin Mixture (ESTER-C) 500-60 MG TABS Take 1 tablet by mouth daily.        BP 146/84  Pulse 76  Temp 98.2 F (36.8 C)  Resp 16  Ht 6' (1.829 m)   Wt 245 lb (111.131 kg)  BMI 33.23 kg/m2  Objective:   Physical Exam  Constitutional: She is oriented to person, place, and time. She appears well-developed and well-nourished.  HENT:  Head: Atraumatic.  Eyes: Conjunctivae normal are normal. Pupils are equal, round, and reactive to light.  Cardiovascular: Normal rate and regular rhythm.   Pulmonary/Chest: Effort normal and breath sounds normal.  Abdominal: Soft. Bowel sounds are normal.  Neurological: She is alert and oriented to person, place, and time.          Assessment & Plan:  Change DM protocol to Victoza trial for both better control and weight management  I have spent more than 30 minutes examining this patient face-to-face of which over half was spent in counseling demonstrated use of and dosing of Victoza and demonstrated injection

## 2012-05-22 ENCOUNTER — Other Ambulatory Visit: Payer: Self-pay | Admitting: Internal Medicine

## 2012-05-22 DIAGNOSIS — Z4789 Encounter for other orthopedic aftercare: Secondary | ICD-10-CM | POA: Diagnosis not present

## 2012-05-23 ENCOUNTER — Other Ambulatory Visit: Payer: Self-pay | Admitting: *Deleted

## 2012-05-23 DIAGNOSIS — E1165 Type 2 diabetes mellitus with hyperglycemia: Secondary | ICD-10-CM

## 2012-05-23 MED ORDER — LIRAGLUTIDE 18 MG/3ML ~~LOC~~ SOLN: 1.8000 mg | Freq: Every day | SUBCUTANEOUS | Status: DC

## 2012-05-30 ENCOUNTER — Ambulatory Visit (INDEPENDENT_AMBULATORY_CARE_PROVIDER_SITE_OTHER): Payer: Medicare Other | Admitting: *Deleted

## 2012-05-30 DIAGNOSIS — D518 Other vitamin B12 deficiency anemias: Secondary | ICD-10-CM

## 2012-05-30 DIAGNOSIS — D519 Vitamin B12 deficiency anemia, unspecified: Secondary | ICD-10-CM

## 2012-05-30 MED ORDER — CYANOCOBALAMIN 1000 MCG/ML IJ SOLN
1000.0000 ug | INTRAMUSCULAR | Status: AC
  Administered 2012-05-30: 1000 ug via INTRAMUSCULAR

## 2012-05-31 DIAGNOSIS — D259 Leiomyoma of uterus, unspecified: Secondary | ICD-10-CM | POA: Diagnosis not present

## 2012-05-31 DIAGNOSIS — N950 Postmenopausal bleeding: Secondary | ICD-10-CM | POA: Diagnosis not present

## 2012-06-08 DIAGNOSIS — M25579 Pain in unspecified ankle and joints of unspecified foot: Secondary | ICD-10-CM | POA: Diagnosis not present

## 2012-06-08 DIAGNOSIS — M899 Disorder of bone, unspecified: Secondary | ICD-10-CM | POA: Diagnosis not present

## 2012-06-08 DIAGNOSIS — M2040 Other hammer toe(s) (acquired), unspecified foot: Secondary | ICD-10-CM | POA: Diagnosis not present

## 2012-06-08 DIAGNOSIS — M79609 Pain in unspecified limb: Secondary | ICD-10-CM | POA: Diagnosis not present

## 2012-06-08 DIAGNOSIS — R52 Pain, unspecified: Secondary | ICD-10-CM | POA: Diagnosis not present

## 2012-06-08 DIAGNOSIS — M6789 Other specified disorders of synovium and tendon, multiple sites: Secondary | ICD-10-CM | POA: Diagnosis not present

## 2012-06-12 ENCOUNTER — Other Ambulatory Visit: Payer: Self-pay | Admitting: Internal Medicine

## 2012-06-20 DIAGNOSIS — I739 Peripheral vascular disease, unspecified: Secondary | ICD-10-CM | POA: Diagnosis not present

## 2012-06-20 DIAGNOSIS — L84 Corns and callosities: Secondary | ICD-10-CM | POA: Diagnosis not present

## 2012-06-20 DIAGNOSIS — E1159 Type 2 diabetes mellitus with other circulatory complications: Secondary | ICD-10-CM | POA: Diagnosis not present

## 2012-07-03 DIAGNOSIS — E1159 Type 2 diabetes mellitus with other circulatory complications: Secondary | ICD-10-CM | POA: Diagnosis not present

## 2012-07-03 DIAGNOSIS — L84 Corns and callosities: Secondary | ICD-10-CM | POA: Diagnosis not present

## 2012-07-03 DIAGNOSIS — I739 Peripheral vascular disease, unspecified: Secondary | ICD-10-CM | POA: Diagnosis not present

## 2012-07-03 DIAGNOSIS — L608 Other nail disorders: Secondary | ICD-10-CM | POA: Diagnosis not present

## 2012-07-04 ENCOUNTER — Other Ambulatory Visit: Payer: Self-pay | Admitting: Internal Medicine

## 2012-07-06 ENCOUNTER — Other Ambulatory Visit: Payer: Self-pay | Admitting: *Deleted

## 2012-07-06 MED ORDER — MELOXICAM 15 MG PO TABS: 15.0000 mg | ORAL_TABLET | Freq: Every day | ORAL | Status: DC

## 2012-07-12 ENCOUNTER — Ambulatory Visit (INDEPENDENT_AMBULATORY_CARE_PROVIDER_SITE_OTHER): Payer: Medicare Other | Admitting: Internal Medicine

## 2012-07-12 DIAGNOSIS — D518 Other vitamin B12 deficiency anemias: Secondary | ICD-10-CM | POA: Diagnosis not present

## 2012-07-12 DIAGNOSIS — D519 Vitamin B12 deficiency anemia, unspecified: Secondary | ICD-10-CM

## 2012-07-12 MED ORDER — CYANOCOBALAMIN 1000 MCG/ML IJ SOLN
1000.0000 ug | INTRAMUSCULAR | Status: AC
  Administered 2012-07-12: 1000 ug via INTRAMUSCULAR

## 2012-07-17 ENCOUNTER — Encounter: Payer: Self-pay | Admitting: Internal Medicine

## 2012-07-17 ENCOUNTER — Ambulatory Visit (INDEPENDENT_AMBULATORY_CARE_PROVIDER_SITE_OTHER): Payer: Medicare Other | Admitting: Internal Medicine

## 2012-07-17 VITALS — BP 140/80 | HR 80 | Temp 98.9°F | Wt 243.0 lb

## 2012-07-17 DIAGNOSIS — E1149 Type 2 diabetes mellitus with other diabetic neurological complication: Secondary | ICD-10-CM

## 2012-07-17 DIAGNOSIS — K219 Gastro-esophageal reflux disease without esophagitis: Secondary | ICD-10-CM | POA: Diagnosis not present

## 2012-07-17 DIAGNOSIS — L84 Corns and callosities: Secondary | ICD-10-CM

## 2012-07-17 DIAGNOSIS — E1142 Type 2 diabetes mellitus with diabetic polyneuropathy: Secondary | ICD-10-CM

## 2012-07-17 DIAGNOSIS — I10 Essential (primary) hypertension: Secondary | ICD-10-CM

## 2012-07-17 LAB — BASIC METABOLIC PANEL
BUN: 19 mg/dL (ref 6–23)
CO2: 26 mEq/L (ref 19–32)
Glucose, Bld: 135 mg/dL — ABNORMAL HIGH (ref 70–99)
Potassium: 4.4 mEq/L (ref 3.5–5.1)
Sodium: 141 mEq/L (ref 135–145)

## 2012-07-17 MED ORDER — FAMOTIDINE 40 MG PO TABS: 40.0000 mg | ORAL_TABLET | Freq: Every day | ORAL | Status: DC

## 2012-07-17 NOTE — Progress Notes (Signed)
Subjective:    Patient ID: [name], female    DOB: [birth year], 72 y.o.   MRN: [id number]  HPI b12 shortage discussed Cannot have b12 shots regularly DM foot ulcer and callus  the right foot reveiwed diet and exercize      Review of Systems  Constitutional: Negative for activity change, appetite change and fatigue.  HENT: Negative for ear pain, congestion, neck pain, postnasal drip and sinus pressure.   Eyes: Negative for redness and visual disturbance.  Respiratory: Negative for cough, shortness of breath and wheezing.   Gastrointestinal: Negative for abdominal pain and abdominal distention.  Genitourinary: Negative for dysuria, frequency and menstrual problem.  Musculoskeletal: Positive for myalgias and joint swelling. Negative for arthralgias.  Skin: Negative for rash and wound.  Neurological: Negative for dizziness, weakness and headaches.  Hematological: Negative for adenopathy. Does not bruise/bleed easily.  Psychiatric/Behavioral: Negative for sleep disturbance and decreased concentration.   Past Medical History  Diagnosis Date  . Hx of breast cancer   . Osteoporosis   . Shingles   . Neuropathy   . AC (acromioclavicular) joint bone spurs   . Diverticulitis   . Heartburn   . Headache     Migraines  . Hammer toe   . PONV (postoperative nausea and vomiting)   . Hypertension     takes Atenolol daily  . History of bronchitis   . Pneumonia     history of;many yrs ago  . History of migraine     last one about 6-46months ago;Prodrin prn  . Joint pain   . Arthritis     right knee;injections every 6months   . Scoliosis   . Sensitive skin     sees a dermatoligist yrly  . IBS (irritable bowel syndrome)   . Vitamin D deficiency     takes Vit d daily  . Diabetes mellitus     takes Amaryl and Januvia daily  . Early cataracts, bilateral   . Glaucoma   . Cancer     hx of breast ca    History   Social History  . Marital Status: Married    Spouse Name:  N/A    Number of Children: N/A  . Years of Education: N/A   Occupational History  . Not on file.   Social History Main Topics  . Smoking status: Never Smoker   . Smokeless tobacco: Not on file  . Alcohol Use: No  . Drug Use: No  . Sexually Active: Not Currently   Other Topics Concern  . Not on file   Social History Narrative  . No narrative on file    Past Surgical History  Procedure Laterality Date  . Cholecystectomy  06/2008  . Tonsillectomy    . Thyroidectomy      24yrs ago  . Mastectomy  1994, 1995  . Laparoscopic oophorectomy    . Laparotomy    . Diagnostic laparoscopy    . Adenoidectomy      at age 4  . Excision removed from neck    . Hysteroscopy    . Colonoscopy    . Shoulder hemi-arthroplasty  01/01/2012    Procedure: SHOULDER HEMI-ARTHROPLASTY;  Surgeon: Dominica Severin, MD;  Location: [facility] OR;  Service: Orthopedics;  Laterality: Left;  Left Shoulder Hemi Arthroplasty with Repair and Reconstruction as Necessary     Family History  Problem Relation Age of Onset  . Arthritis Mother   . COPD Father   . Heart disease Father   .  Hypertension Father   . Hyperlipidemia Father     Allergies  Allergen Reactions  . Cephalexin Other (See Comments)    Patient can't remember reaction  . Erythromycin Ethylsuccinate Hives and Diarrhea  . Nitrofurantoin Other (See Comments)    Severe headache  . Percocet (Oxycodone-Acetaminophen) Itching    Current Outpatient Prescriptions on File Prior to Visit  Medication Sig Dispense Refill  . Alpha-Lipoic Acid 600 MG CAPS Take 1 capsule by mouth daily.      [name] APAP-Isometheptene-Caffeine (PRODRIN) 500-130-20 MG TABS Take 2 tablets by mouth daily as needed. At the onset of migraine      . aspirin 325 MG tablet Take 325 mg by mouth once a week. Sunday only      . aspirin 81 MG tablet Take 81 mg by mouth. Daily Monday through Saturday      . atenolol (TENORMIN) 25 MG tablet Take 1 tablet (25 mg total) by mouth daily.  90 tablet   3  . bimatoprost (LUMIGAN) 0.03 % ophthalmic drops Place 1 drop into both eyes at bedtime.        . cholecalciferol (VITAMIN D) 1000 UNITS tablet Take 1,000 Units by mouth daily.      [name] co-enzyme Q-10 50 MG capsule Take 150 mg by mouth daily.      . cyanocobalamin (,VITAMIN B-12,) 1000 MCG/ML injection Inject 1,000 mcg into the muscle every 21 ( twenty-one) days. Last dose 1/25      . dorzolamide (TRUSOPT) 2 % ophthalmic solution 1 drop 3 (three) times daily.      [name] doxycycline (VIBRA-TABS) 100 MG tablet Take 1 tablet (100 mg total) by mouth 2 (two) times daily.  14 tablet  0  . estradiol (ESTRING) 2 MG vaginal ring Place 2 mg vaginally every 3 (three) months. follow package directions       . ESZOPICLONE 3 MG tablet TAKE ONE TABLET BY MOUTH AT BEDTIME AS NEEDED FOR SLEEP  30 tablet  3  . Flaxseed, Linseed, (FLAX SEED OIL) 1000 MG CAPS Take 1 capsule by mouth daily.       [name] FLUoxetine (PROZAC) 20 MG capsule TAKE ONE CAPSULE BY MOUTH EVERY DAY  30 capsule  0  . FREESTYLE LITE test strip USE ONE STRIP TO CHECK GLUCOSE EVERY DAY  100 each  4  . glimepiride (AMARYL) 4 MG tablet TAKE ONE TABLET BY MOUTH EVERY DAY  30 tablet  10  . Grape Seed Extract 100 MG CAPS Take 150 mg by mouth daily.       . Lancets (FREESTYLE) lancets AS DIRECTED  100 each  0  . Liraglutide (VICTOZA) 18 MG/3ML SOLN Inject 0.3 mLs (1.8 mg total) into the skin daily.  9 mL  11  . magnesium gluconate (MAGONATE) 500 MG tablet Take 500 mg by mouth at bedtime.        . meloxicam (MOBIC) 15 MG tablet Take 1 tablet (15 mg total) by mouth daily.  30 tablet  1  . Methylcellulose, Laxative, (CITRUCEL) 500 MG TABS Take 2 tablets by mouth daily after supper.      . Multiple Vitamins-Minerals (CENTRUM SILVER PO) Take 1 tablet by mouth daily.       . Omega-3 Fatty Acids (FISH OIL) 1200 MG CAPS Take 1 capsule by mouth 2 (two) times daily.      [name] omeprazole (PRILOSEC) 20 MG capsule Take 1 capsule (20 mg total) by mouth daily.  30 capsule  3  .  pramipexole (MIRAPEX)  0.5 MG tablet TAKE ONE TABLET BY MOUTH AT BEDTIME  30 tablet  10  . Selenium 200 MCG CAPS Take 200 mcg by mouth daily.      . Vitamin Mixture (ESTER-C) 500-60 MG TABS Take 1 tablet by mouth daily.       No current facility-administered medications on file prior to visit.    BP 140/80  Pulse 80  Temp(Src) 98.9 F (37.2 C) (Oral)  Wt 243 lb (110.224 kg)  BMI 32.95 kg/m2   '    Objective:   Physical Exam  Nursing note and vitals reviewed. Constitutional: She appears well-developed and well-nourished.  HENT:  Head: Normocephalic and atraumatic.  Cardiovascular: Normal rate.   Murmur heard. Abdominal: Soft. Bowel sounds are normal.  Musculoskeletal: She exhibits edema and tenderness.  Neurological:  neuropathy  Skin: Skin is warm and dry.  Psychiatric: She has a normal mood and affect. Her behavior is normal.   Foot ulcerations and callus formation       Assessment & Plan:  DM with neuropathy and foot pain Hammer toe surgery planned without pins DM foot/ show to prevent callus and ulceration Need for exercise and diet reveiwed

## 2012-08-02 DIAGNOSIS — H4011X0 Primary open-angle glaucoma, stage unspecified: Secondary | ICD-10-CM | POA: Diagnosis not present

## 2012-08-02 DIAGNOSIS — H2510 Age-related nuclear cataract, unspecified eye: Secondary | ICD-10-CM | POA: Diagnosis not present

## 2012-08-02 DIAGNOSIS — H401190 Primary open-angle glaucoma, unspecified eye, stage unspecified: Secondary | ICD-10-CM | POA: Insufficient documentation

## 2012-08-02 DIAGNOSIS — H409 Unspecified glaucoma: Secondary | ICD-10-CM | POA: Diagnosis not present

## 2012-08-04 ENCOUNTER — Encounter: Payer: Self-pay | Admitting: Nurse Practitioner

## 2012-08-07 ENCOUNTER — Ambulatory Visit (INDEPENDENT_AMBULATORY_CARE_PROVIDER_SITE_OTHER): Payer: Medicare Other | Admitting: Internal Medicine

## 2012-08-07 DIAGNOSIS — D518 Other vitamin B12 deficiency anemias: Secondary | ICD-10-CM | POA: Diagnosis not present

## 2012-08-07 MED ORDER — CYANOCOBALAMIN 1000 MCG/ML IJ SOLN
1000.0000 ug | Freq: Once | INTRAMUSCULAR | Status: AC
  Administered 2012-08-07: 1000 ug via INTRAMUSCULAR

## 2012-08-08 ENCOUNTER — Ambulatory Visit (INDEPENDENT_AMBULATORY_CARE_PROVIDER_SITE_OTHER): Payer: Medicare Other | Admitting: Nurse Practitioner

## 2012-08-08 ENCOUNTER — Ambulatory Visit: Payer: Self-pay | Admitting: Nurse Practitioner

## 2012-08-08 ENCOUNTER — Encounter: Payer: Self-pay | Admitting: Nurse Practitioner

## 2012-08-08 ENCOUNTER — Other Ambulatory Visit: Payer: Self-pay | Admitting: Internal Medicine

## 2012-08-08 VITALS — BP 132/86 | HR 78 | Resp 18 | Wt 243.0 lb

## 2012-08-08 DIAGNOSIS — N952 Postmenopausal atrophic vaginitis: Secondary | ICD-10-CM | POA: Diagnosis not present
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
## 2012-08-08 NOTE — Progress Notes (Deleted)
,,,

## 2012-08-09 NOTE — Progress Notes (Signed)
Encounter reviewed by Dr. Savvas Roper Silva.  

## 2012-08-15 ENCOUNTER — Encounter: Payer: Self-pay | Admitting: Internal Medicine

## 2012-08-18 DIAGNOSIS — L821 Other seborrheic keratosis: Secondary | ICD-10-CM | POA: Diagnosis not present

## 2012-08-18 DIAGNOSIS — D1801 Hemangioma of skin and subcutaneous tissue: Secondary | ICD-10-CM | POA: Diagnosis not present

## 2012-08-18 DIAGNOSIS — L819 Disorder of pigmentation, unspecified: Secondary | ICD-10-CM | POA: Diagnosis not present

## 2012-08-18 DIAGNOSIS — L820 Inflamed seborrheic keratosis: Secondary | ICD-10-CM | POA: Diagnosis not present

## 2012-08-18 DIAGNOSIS — D510 Vitamin B12 deficiency anemia due to intrinsic factor deficiency: Secondary | ICD-10-CM | POA: Diagnosis not present

## 2012-09-05 ENCOUNTER — Other Ambulatory Visit: Payer: Self-pay | Admitting: Internal Medicine

## 2012-09-08 DIAGNOSIS — D510 Vitamin B12 deficiency anemia due to intrinsic factor deficiency: Secondary | ICD-10-CM | POA: Diagnosis not present

## 2012-09-11 DIAGNOSIS — I739 Peripheral vascular disease, unspecified: Secondary | ICD-10-CM | POA: Diagnosis not present

## 2012-09-11 DIAGNOSIS — L608 Other nail disorders: Secondary | ICD-10-CM | POA: Diagnosis not present

## 2012-09-11 DIAGNOSIS — L84 Corns and callosities: Secondary | ICD-10-CM | POA: Diagnosis not present

## 2012-09-11 DIAGNOSIS — E1159 Type 2 diabetes mellitus with other circulatory complications: Secondary | ICD-10-CM | POA: Diagnosis not present

## 2012-09-12 DIAGNOSIS — H4011X0 Primary open-angle glaucoma, stage unspecified: Secondary | ICD-10-CM | POA: Diagnosis not present

## 2012-09-12 DIAGNOSIS — H409 Unspecified glaucoma: Secondary | ICD-10-CM | POA: Diagnosis not present

## 2012-09-22 DIAGNOSIS — N390 Urinary tract infection, site not specified: Secondary | ICD-10-CM | POA: Diagnosis not present

## 2012-09-26 ENCOUNTER — Encounter: Payer: Self-pay | Admitting: Internal Medicine

## 2012-10-06 DIAGNOSIS — D510 Vitamin B12 deficiency anemia due to intrinsic factor deficiency: Secondary | ICD-10-CM | POA: Diagnosis not present

## 2012-10-07 ENCOUNTER — Other Ambulatory Visit: Payer: Self-pay | Admitting: Internal Medicine

## 2012-10-07 ENCOUNTER — Other Ambulatory Visit: Payer: Self-pay | Admitting: Family

## 2012-10-09 ENCOUNTER — Other Ambulatory Visit: Payer: Self-pay | Admitting: *Deleted

## 2012-10-09 MED ORDER — MELOXICAM 15 MG PO TABS: 15.0000 mg | ORAL_TABLET | Freq: Every day | ORAL | Status: DC

## 2012-10-30 ENCOUNTER — Encounter: Payer: Self-pay | Admitting: Internal Medicine

## 2012-10-30 ENCOUNTER — Other Ambulatory Visit: Payer: Self-pay | Admitting: *Deleted

## 2012-10-30 ENCOUNTER — Ambulatory Visit (INDEPENDENT_AMBULATORY_CARE_PROVIDER_SITE_OTHER): Payer: Medicare Other | Admitting: Internal Medicine

## 2012-10-30 VITALS — BP 130/80 | HR 76 | Temp 98.2°F | Resp 16 | Ht 72.0 in | Wt 245.0 lb

## 2012-10-30 DIAGNOSIS — IMO0001 Reserved for inherently not codable concepts without codable children: Secondary | ICD-10-CM | POA: Diagnosis not present

## 2012-10-30 DIAGNOSIS — G4700 Insomnia, unspecified: Secondary | ICD-10-CM

## 2012-10-30 DIAGNOSIS — I10 Essential (primary) hypertension: Secondary | ICD-10-CM | POA: Diagnosis not present

## 2012-10-30 LAB — BASIC METABOLIC PANEL
CO2: 29 mEq/L (ref 19–32)
Calcium: 9.3 mg/dL (ref 8.4–10.5)
GFR: 96.73 mL/min (ref >60.00)
Glucose, Bld: 115 mg/dL — ABNORMAL HIGH (ref 70–99)
Potassium: 3.9 mEq/L (ref 3.5–5.1)
Sodium: 140 mEq/L (ref 135–145)

## 2012-10-30 LAB — HEMOGLOBIN A1C: Hgb A1c MFr Bld: 6.1 % (ref 4.6–6.5)

## 2012-10-30 MED ORDER — TEMAZEPAM 15 MG PO CAPS: 15.0000 mg | ORAL_CAPSULE | Freq: Every evening | ORAL | Status: DC | PRN

## 2012-10-30 MED ORDER — ACIDOPHILUS PROBIOTIC 100 MG PO CAPS: 100.0000 mg | ORAL_CAPSULE | Freq: Every day | ORAL | Status: DC

## 2012-10-30 NOTE — Telephone Encounter (Signed)
ok 

## 2012-10-30 NOTE — Patient Instructions (Signed)
The patient is instructed to continue all medications as prescribed. Schedule followup with check out clerk upon leaving the clinic  

## 2012-10-30 NOTE — Progress Notes (Signed)
Subjective:    Patient ID: [name], female    DOB: Apr 04, 1941, 72 y.o.   MRN: [id number]  HPI Right ear hearing loss  With history of tube removed DM with good CBG's and need a1c GERD and obesity multiple orthopedic conditions   Review of Systems  Constitutional: Negative for activity change, appetite change and fatigue.  HENT: Negative for ear pain, congestion, neck pain, postnasal drip and sinus pressure.   Eyes: Negative for redness and visual disturbance.  Respiratory: Negative for cough, shortness of breath and wheezing.   Gastrointestinal: Positive for abdominal pain. Negative for abdominal distention.  Genitourinary: Negative for dysuria, frequency and menstrual problem.  Musculoskeletal: Positive for joint swelling and gait problem. Negative for myalgias and arthralgias.  Skin: Positive for pallor and wound. Negative for rash.  Neurological: Negative for dizziness, weakness and headaches.  Hematological: Negative for adenopathy. Does not bruise/bleed easily.  Psychiatric/Behavioral: Negative for sleep disturbance and decreased concentration.   Past Medical History  Diagnosis Date  . Hx of breast cancer 08/1992 right    intraductal ca/ w/ microinvasion ER/PR -  . Osteoporosis   . Shingles   . Neuropathy   . AC (acromioclavicular) joint bone spurs   . Diverticulitis   . Heartburn   . Headache(784.0)     Migraines  . Hammer toe   . PONV (postoperative nausea and vomiting)   . Hypertension     takes Atenolol daily  . History of bronchitis   . Pneumonia     history of;many yrs ago  . History of migraine     last one about 6-40months ago;Prodrin prn  . Joint pain   . Arthritis     right knee;injections every 6months   . Scoliosis   . Sensitive skin     sees a dermatoligist yrly  . IBS (irritable bowel syndrome)   . Vitamin D deficiency     takes Vit d daily  . Diabetes mellitus     takes Amaryl and Januvia daily  . Early cataracts, bilateral   .  Glaucoma   . Cancer     hx of breast ca  . Endometriosis   . Postmenopausal atrophic vaginitis     uses Estring    History   Social History  . Marital Status: Married    Spouse Name: N/A    Number of Children: N/A  . Years of Education: N/A   Occupational History  . Not on file.   Social History Main Topics  . Smoking status: Never Smoker   . Smokeless tobacco: Never Used  . Alcohol Use: No  . Drug Use: No  . Sexually Active: Not Currently   Other Topics Concern  . Not on file   Social History Narrative  . No narrative on file    Past Surgical History  Procedure Laterality Date  . Cholecystectomy  06/2008  . Tonsillectomy    . Thyroidectomy      59yrs ago  . Mastectomy Bilateral 1994 right, 1995 left  . Laparoscopic oophorectomy Right 12/2004    absent LSO  . Laparotomy    . Diagnostic laparoscopy    . Adenoidectomy      at age 10  . Excision removed from neck    . Colonoscopy  11/02/05    Diverticulosis, recheck 7 years.  Yearly HOC  . Shoulder hemi-arthroplasty  01/01/2012    Procedure: SHOULDER HEMI-ARTHROPLASTY;  Surgeon: Dominica Severin, MD;  Location: [facility] OR;  Service: Orthopedics;  Laterality:  Left;  Left Shoulder Hemi Arthroplasty with Repair and Reconstruction as Necessary   . Endometrial biopsy  06/22/2011    benign polyp, no hyperplasia  . Hysteroscopy  06/22/11    PMB submucosal myoma    Family History  Problem Relation Age of Onset  . Arthritis Mother   . COPD Father   . Heart disease Father   . Hypertension Father   . Hyperlipidemia Father     Allergies  Allergen Reactions  . Cephalexin Diarrhea    Patient can't remember reaction (per chart at [facility] states diarrhea)  . Erythromycin Ethylsuccinate Hives and Diarrhea  . Nitrofurantoin Other (See Comments)    Severe headache  . Percocet (Oxycodone-Acetaminophen) Itching    Current Outpatient Prescriptions on File Prior to Visit  Medication Sig Dispense Refill  . Alpha-Lipoic Acid 600 MG  CAPS Take 1 capsule by mouth daily.      [name] APAP-Isometheptene-Caffeine (PRODRIN) 500-130-20 MG TABS Take 2 tablets by mouth daily as needed. At the onset of migraine      . aspirin 325 MG tablet Take 325 mg by mouth once a week. Sunday only      . aspirin 81 MG tablet Take 81 mg by mouth. Daily Monday through Saturday      . atenolol (TENORMIN) 25 MG tablet TAKE ONE TABLET BY MOUTH DAILY  90 tablet  0  . bimatoprost (LUMIGAN) 0.03 % ophthalmic drops Place 1 drop into both eyes at bedtime.        . cholecalciferol (VITAMIN D) 1000 UNITS tablet Take 1,000 Units by mouth daily.      [name] co-enzyme Q-10 50 MG capsule Take 150 mg by mouth daily.      . cyanocobalamin (,VITAMIN B-12,) 1000 MCG/ML injection Inject 1,000 mcg into the muscle every 21 ( twenty-one) days. Last dose 1/25      . dorzolamide (TRUSOPT) 2 % ophthalmic solution 1 drop 3 (three) times daily.      . dorzolamide-timolol (COSOPT) 22.3-6.8 MG/ML ophthalmic solution 2 (two) times daily.       [name] estradiol (ESTRING) 2 MG vaginal ring Place 2 mg vaginally every 3 (three) months. follow package directions       . Flaxseed, Linseed, (FLAX SEED OIL) 1000 MG CAPS Take 1 capsule by mouth daily.       [name] FLUoxetine (PROZAC) 20 MG capsule TAKE ONE CAPSULE BY MOUTH ONCE DAILY  90 capsule  3  . FREESTYLE LITE test strip USE ONE STRIP TO CHECK GLUCOSE EVERY DAY  100 each  4  . glimepiride (AMARYL) 4 MG tablet TAKE ONE TABLET BY MOUTH EVERY DAY  30 tablet  10  . Grape Seed Extract 100 MG CAPS Take 150 mg by mouth daily.       . Lancets (FREESTYLE) lancets AS DIRECTED  100 each  0  . Liraglutide (VICTOZA) 18 MG/3ML SOLN Inject 0.3 mLs (1.8 mg total) into the skin daily.  9 mL  11  . magnesium gluconate (MAGONATE) 500 MG tablet Take 500 mg by mouth at bedtime.        . meloxicam (MOBIC) 15 MG tablet Take 1 tablet (15 mg total) by mouth daily.  90 tablet  3  . Methylcellulose, Laxative, (CITRUCEL) 500 MG TABS Take 2 tablets by mouth daily after supper.       . Multiple Vitamins-Minerals (CENTRUM SILVER PO) Take 1 tablet by mouth daily.       . Omega-3 Fatty Acids (FISH OIL) 1200 MG CAPS  Take 1 capsule by mouth 2 (two) times daily.      [name] omeprazole (PRILOSEC) 20 MG capsule       . omeprazole (PRILOSEC) 20 MG capsule TAKE ONE CAPSULE BY MOUTH EVERY DAY  90 capsule  0  . pramipexole (MIRAPEX) 0.5 MG tablet TAKE ONE TABLET BY MOUTH AT BEDTIME  30 tablet  10  . Selenium 200 MCG CAPS Take 200 mcg by mouth daily.      . sitaGLIPtin (JANUVIA) 100 MG tablet Take 100 mg by mouth daily.      . Vitamin Mixture (ESTER-C) 500-60 MG TABS Take 1 tablet by mouth daily.       No current facility-administered medications on file prior to visit.    BP 130/80  Pulse 76  Temp(Src) 98.2 F (36.8 C)  Resp 16  Ht 6' (1.829 m)  Wt 245 lb (111.131 kg)  BMI 33.22 kg/m2       Objective:   Physical Exam  Constitutional: She is oriented to person, place, and time. She appears well-developed and well-nourished.  HENT:  Head: Normocephalic and atraumatic.  Wax impaction bilateral  Cardiovascular: Normal rate and regular rhythm.   Murmur heard. Pulmonary/Chest: She has no wheezes. She has no rales.  Abdominal: Soft. Bowel sounds are normal.  Neurological: She is alert and oriented to person, place, and time.          Assessment & Plan:  Wax in ears Lavage  Informed consent was obtained and peroxide gel was inserted into the ears bilaterally using the lavage kit the ears were lavaged until clean.Inspection with a cerumen spoon removed residual wax. Patient tolerated the procedure well. Stable DM monitor A1C HTN stable monitor bmet restoril trial for sleep

## 2012-11-01 ENCOUNTER — Telehealth: Payer: Self-pay | Admitting: Internal Medicine

## 2012-11-01 DIAGNOSIS — H7290 Unspecified perforation of tympanic membrane, unspecified ear: Secondary | ICD-10-CM | POA: Diagnosis not present

## 2012-11-01 DIAGNOSIS — H6980 Other specified disorders of Eustachian tube, unspecified ear: Secondary | ICD-10-CM | POA: Diagnosis not present

## 2012-11-01 DIAGNOSIS — H902 Conductive hearing loss, unspecified: Secondary | ICD-10-CM | POA: Diagnosis not present

## 2012-11-01 NOTE — Telephone Encounter (Signed)
See other note

## 2012-11-01 NOTE — Telephone Encounter (Signed)
Patient Information:  Caller Name: Ayako  Phone: [phone]  Patient: [name], [name]  Gender: Female  DOB: 01-15-1941  Age: 72 Years  PCP: Darryll Capers (Adults only)  Office Follow Up:  Does the office need to follow up with this patient?: Yes  Instructions For The Office: Office please follow up with patient regarding ear drops (should she continue the Cortisporin Otic or be prescribed new drops or be seen by ENT?  RN Note:  RN pulled up patient chart in EPIC and saw where previous RN had triaged patient; so this RN did not retriage.  Symptoms  Reason For Call & Symptoms: pt states that she is using drops that were prescribed by Dr Lovell Sheehan on 10/30/12. Pt used the first time on 10/31/12 and had severe pain (like knives in her ear).  The pain lasted for 1 hour.  Pt is not sure what to do about continuing the drops.  Reviewed Health History In EMR: N/A  Reviewed Medications In EMR: N/A  Reviewed Allergies In EMR: No  Reviewed Surgeries / Procedures: No  Date of Onset of Symptoms: 10/31/2012  Treatments Tried: Tylenol  Treatments Tried Worked: No  Guideline(s) Used:  No Protocol Available - Sick Adult  Disposition Per Guideline:   Discuss with PCP and Callback by Nurse Today  Reason For Disposition Reached:   Nursing judgment  Advice Given:  N/A  Patient Will Follow Care Advice:  YES

## 2012-11-01 NOTE — Telephone Encounter (Signed)
Stop ear drops and call ent that did surgery

## 2012-11-01 NOTE — Telephone Encounter (Signed)
Patient Information:  Caller Name: Nanetta  Phone: [phone]  Patient: [name], [name]  Gender: Female  DOB: [birth year]  Age: 72 Years  PCP: Darryll Capers (Adults only)  Office Follow Up:  Does the office need to follow up with this patient?: Yes  Instructions For The Office: Patient asks only if she needs to continue drops that caused her significant pain for an hour after use and/ or does she need to follow up with ENT who removed her ear tubes.   Symptoms  Reason For Call & Symptoms: Patient reports she has right ear infection; when she instilled Rx drops it caused intense pain in the ear for over an hour. Neo/Poly/HC Otic 1% suspension (Cortisporin Otic) is Rx medication.  Note to office for follow up. Caller asks if she should return to ENT who removed her ear tubes. Pain up to 10 with instilling ear drops; currently 0. See Today in Office per Earache protocol due to "All other earaches".  Caller asks only if she needs to continue Cortisporin drops and/ or if she needs to follow up with ENT.  Reviewed Health History In EMR: Yes  Reviewed Medications In EMR: Yes  Reviewed Allergies In EMR: Yes  Reviewed Surgeries / Procedures: Yes  Date of Onset of Symptoms: 10/31/2012  Treatments Tried: Tylenol  Treatments Tried Worked: Yes  Guideline(s) Used:  Earache  Disposition Per Guideline:   See Today in Office  Reason For Disposition Reached:   All other earaches (Exceptions: earache lasting < 1 hour, and earache from air travel)  Advice Given:  Call Back If  High fever, severe headache, or stiff neck occurs  You become worse.  Contagiousness:  Ear infections are not contagious.  Patient Refused Recommendation:  Patient Refused Care Advice  Patient asks only if she needs to continue drops that caused her significant pain for an hour after use and/ or does she need to follow up with ENT who removed her ear tubes.

## 2012-11-04 DIAGNOSIS — D510 Vitamin B12 deficiency anemia due to intrinsic factor deficiency: Secondary | ICD-10-CM | POA: Diagnosis not present

## 2012-11-04 DIAGNOSIS — R350 Frequency of micturition: Secondary | ICD-10-CM | POA: Diagnosis not present

## 2012-11-06 DIAGNOSIS — H409 Unspecified glaucoma: Secondary | ICD-10-CM | POA: Diagnosis not present

## 2012-11-06 DIAGNOSIS — H4011X0 Primary open-angle glaucoma, stage unspecified: Secondary | ICD-10-CM | POA: Diagnosis not present

## 2012-11-11 ENCOUNTER — Other Ambulatory Visit: Payer: Self-pay | Admitting: Family

## 2012-11-13 ENCOUNTER — Ambulatory Visit (INDEPENDENT_AMBULATORY_CARE_PROVIDER_SITE_OTHER): Payer: Medicare Other | Admitting: Nurse Practitioner

## 2012-11-13 VITALS — BP 128/74 | HR 76 | Resp 14 | Ht 71.5 in | Wt 254.2 lb

## 2012-11-13 DIAGNOSIS — N952 Postmenopausal atrophic vaginitis: Secondary | ICD-10-CM | POA: Diagnosis not present

## 2012-11-13 NOTE — Patient Instructions (Addendum)
Return for annual exam at next visit.

## 2012-11-13 NOTE — Progress Notes (Signed)
Subjective:     Patient ID: [name], female   DOB: Jul 13, 1940, 72 y.o.   MRN: [id number]  HPI 72 yo WM Fe presents for change of Estring vaginal ring. She has done well without urinary symptoms.  She is past due for her AEX.   Estring ring this time was costly because she is in the 'donut hole'.  Her son with bipolar is acting out right now and this has caused her some distress.  Dr. Lovell Sheehan tried her on Restoril 15 mg to help with sleep - but not working.   Review of Systems  Constitutional: Negative for fever, chills, appetite change, fatigue and unexpected weight change.  HENT: Negative.   Respiratory: Negative.   Cardiovascular: Negative.   Gastrointestinal: Negative.   Endocrine: Negative.   Genitourinary: Negative for dysuria, urgency, frequency, hematuria, flank pain, decreased urine volume, vaginal discharge, vaginal pain, pelvic pain and dyspareunia.  Musculoskeletal: Negative.   Skin: Negative.   Psychiatric/Behavioral: Positive for sleep disturbance.       Normally in past has not done well with other sleep med's.       Objective:   Physical Exam  Constitutional: She is oriented to person, place, and time. She appears well-developed and well-nourished.  Abdominal: Soft. She exhibits no distension. There is no tenderness. There is no rebound and no guarding.  Genitourinary:  Estring removed with use of forceps. Some discomfort, no erosions.  New Estring replaced. Normal vaginal tissue and urethra.  Musculoskeletal: Normal range of motion.  Neurological: She is alert and oriented to person, place, and time.  Psychiatric: She has a normal mood and affect. Her behavior is normal. Judgment and thought content normal.       Assessment:     Atrophic Vaginitis Insomnia secondary to situational stressors    Plan:     Estring replaced and will plan for AEX at next recheck in 3 months She will try Restoril at 30 mg every other day for this week and then contact PCP if  either not helping or changing doses.

## 2012-11-14 NOTE — Progress Notes (Signed)
Encounter reviewed by Dr. Brook Silva.  

## 2012-11-16 ENCOUNTER — Encounter: Payer: Self-pay | Admitting: Internal Medicine

## 2012-11-20 ENCOUNTER — Other Ambulatory Visit: Payer: Self-pay | Admitting: *Deleted

## 2012-11-20 DIAGNOSIS — G4700 Insomnia, unspecified: Secondary | ICD-10-CM

## 2012-11-20 MED ORDER — TEMAZEPAM 15 MG PO CAPS: ORAL_CAPSULE | ORAL | Status: DC

## 2012-11-21 DIAGNOSIS — H7290 Unspecified perforation of tympanic membrane, unspecified ear: Secondary | ICD-10-CM | POA: Diagnosis not present

## 2012-11-24 ENCOUNTER — Telehealth: Payer: Self-pay | Admitting: Internal Medicine

## 2012-11-24 DIAGNOSIS — G4700 Insomnia, unspecified: Secondary | ICD-10-CM

## 2012-11-24 NOTE — Telephone Encounter (Signed)
callled in as requested

## 2012-11-24 NOTE — Telephone Encounter (Signed)
Pt states that her insurance will not pay for the temazepam (RESTORIL) 15 MG capsule 1-2 times per day. The rx will not cover the "1-2" SIG. Pt requesting rx be written for 30mg  once daily.  [facility].

## 2012-11-30 DIAGNOSIS — E1159 Type 2 diabetes mellitus with other circulatory complications: Secondary | ICD-10-CM | POA: Diagnosis not present

## 2012-11-30 DIAGNOSIS — L84 Corns and callosities: Secondary | ICD-10-CM | POA: Diagnosis not present

## 2012-11-30 DIAGNOSIS — I739 Peripheral vascular disease, unspecified: Secondary | ICD-10-CM | POA: Diagnosis not present

## 2012-11-30 DIAGNOSIS — L608 Other nail disorders: Secondary | ICD-10-CM | POA: Diagnosis not present

## 2012-12-05 DIAGNOSIS — D510 Vitamin B12 deficiency anemia due to intrinsic factor deficiency: Secondary | ICD-10-CM | POA: Diagnosis not present

## 2012-12-07 ENCOUNTER — Encounter: Payer: Self-pay | Admitting: Internal Medicine

## 2012-12-19 ENCOUNTER — Encounter: Payer: Self-pay | Admitting: Internal Medicine

## 2012-12-20 DIAGNOSIS — M1710 Unilateral primary osteoarthritis, unspecified knee: Secondary | ICD-10-CM | POA: Diagnosis not present

## 2012-12-22 DIAGNOSIS — H7290 Unspecified perforation of tympanic membrane, unspecified ear: Secondary | ICD-10-CM | POA: Diagnosis not present

## 2012-12-26 DIAGNOSIS — D510 Vitamin B12 deficiency anemia due to intrinsic factor deficiency: Secondary | ICD-10-CM | POA: Diagnosis not present

## 2012-12-26 DIAGNOSIS — M1710 Unilateral primary osteoarthritis, unspecified knee: Secondary | ICD-10-CM | POA: Diagnosis not present

## 2013-01-02 DIAGNOSIS — M1710 Unilateral primary osteoarthritis, unspecified knee: Secondary | ICD-10-CM | POA: Diagnosis not present

## 2013-01-03 ENCOUNTER — Encounter: Payer: Self-pay | Admitting: Nurse Practitioner

## 2013-01-09 DIAGNOSIS — M1710 Unilateral primary osteoarthritis, unspecified knee: Secondary | ICD-10-CM | POA: Diagnosis not present

## 2013-01-16 DIAGNOSIS — Z23 Encounter for immunization: Secondary | ICD-10-CM | POA: Diagnosis not present

## 2013-01-16 DIAGNOSIS — D510 Vitamin B12 deficiency anemia due to intrinsic factor deficiency: Secondary | ICD-10-CM | POA: Diagnosis not present

## 2013-01-17 DIAGNOSIS — M1710 Unilateral primary osteoarthritis, unspecified knee: Secondary | ICD-10-CM | POA: Diagnosis not present

## 2013-02-08 ENCOUNTER — Ambulatory Visit: Payer: Medicare Other | Admitting: Nurse Practitioner

## 2013-02-09 ENCOUNTER — Ambulatory Visit: Payer: Medicare Other | Admitting: Nurse Practitioner

## 2013-02-12 ENCOUNTER — Ambulatory Visit: Payer: Medicare Other | Admitting: Nurse Practitioner

## 2013-02-12 ENCOUNTER — Ambulatory Visit (INDEPENDENT_AMBULATORY_CARE_PROVIDER_SITE_OTHER): Payer: Medicare Other | Admitting: Internal Medicine

## 2013-02-12 ENCOUNTER — Encounter: Payer: Self-pay | Admitting: Internal Medicine

## 2013-02-12 VITALS — BP 160/84 | HR 80 | Temp 98.2°F | Resp 16 | Ht 71.5 in | Wt 246.0 lb

## 2013-02-12 DIAGNOSIS — M5416 Radiculopathy, lumbar region: Secondary | ICD-10-CM

## 2013-02-12 DIAGNOSIS — G608 Other hereditary and idiopathic neuropathies: Secondary | ICD-10-CM | POA: Diagnosis not present

## 2013-02-12 DIAGNOSIS — K7689 Other specified diseases of liver: Secondary | ICD-10-CM

## 2013-02-12 DIAGNOSIS — E785 Hyperlipidemia, unspecified: Secondary | ICD-10-CM

## 2013-02-12 DIAGNOSIS — G4700 Insomnia, unspecified: Secondary | ICD-10-CM

## 2013-02-12 DIAGNOSIS — E119 Type 2 diabetes mellitus without complications: Secondary | ICD-10-CM | POA: Diagnosis not present

## 2013-02-12 DIAGNOSIS — IMO0002 Reserved for concepts with insufficient information to code with codable children: Secondary | ICD-10-CM

## 2013-02-12 DIAGNOSIS — J302 Other seasonal allergic rhinitis: Secondary | ICD-10-CM

## 2013-02-12 DIAGNOSIS — J309 Allergic rhinitis, unspecified: Secondary | ICD-10-CM

## 2013-02-12 DIAGNOSIS — D510 Vitamin B12 deficiency anemia due to intrinsic factor deficiency: Secondary | ICD-10-CM | POA: Diagnosis not present

## 2013-02-12 LAB — COMPREHENSIVE METABOLIC PANEL
AST: 22 U/L (ref 0–37)
Albumin: 3.8 g/dL (ref 3.5–5.2)
Alkaline Phosphatase: 72 U/L (ref 39–117)
BUN: 20 mg/dL (ref 6–23)
Calcium: 9 mg/dL (ref 8.4–10.5)
Chloride: 103 mEq/L (ref 96–112)
Glucose, Bld: 98 mg/dL (ref 70–99)
Potassium: 4.1 mEq/L (ref 3.5–5.1)
Sodium: 139 mEq/L (ref 135–145)
Total Protein: 7 g/dL (ref 6.0–8.3)

## 2013-02-12 LAB — LIPID PANEL
Cholesterol: 155 mg/dL (ref 0–200)
Triglycerides: 84 mg/dL (ref 0.0–149.0)

## 2013-02-12 MED ORDER — ZALEPLON 10 MG PO CAPS: 10.0000 mg | ORAL_CAPSULE | Freq: Every day | ORAL | Status: DC

## 2013-02-12 NOTE — Patient Instructions (Signed)
The patient is instructed to continue all medications as prescribed. Schedule followup with check out clerk upon leaving the clinic  

## 2013-02-12 NOTE — Progress Notes (Signed)
Subjective:    Patient ID: [name], female    DOB: Jan 10, 1941, 72 y.o.   MRN: [id number]  HPI monitoring for DM Has neuropathy and radiculopathy on the left leg Marked weakness on the left    Review of Systems  Constitutional: Negative for activity change, appetite change and fatigue.  HENT: Negative for ear pain, congestion, neck pain, postnasal drip and sinus pressure.   Eyes: Negative for redness and visual disturbance.  Respiratory: Negative for cough, shortness of breath and wheezing.   Gastrointestinal: Negative for abdominal pain and abdominal distention.  Genitourinary: Negative for dysuria, frequency and menstrual problem.  Musculoskeletal: Negative for myalgias, joint swelling and arthralgias.  Skin: Negative for rash and wound.  Neurological: Negative for dizziness, weakness and headaches.  Hematological: Negative for adenopathy. Does not bruise/bleed easily.  Psychiatric/Behavioral: Negative for sleep disturbance and decreased concentration.   Past Medical History  Diagnosis Date  . Hx of breast cancer 08/1992 right    intraductal ca/ w/ microinvasion ER/PR -  . Osteoporosis   . Shingles   . Neuropathy   . AC (acromioclavicular) joint bone spurs   . Diverticulitis   . Heartburn   . Headache(784.0)     Migraines  . Hammer toe   . PONV (postoperative nausea and vomiting)   . Hypertension     takes Atenolol daily  . History of bronchitis   . Pneumonia     history of;many yrs ago  . History of migraine     last one about 6-25months ago;Prodrin prn  . Joint pain   . Arthritis     right knee;injections every 6months   . Scoliosis   . Sensitive skin     sees a dermatoligist yrly  . IBS (irritable bowel syndrome)   . Vitamin D deficiency     takes Vit d daily  . Diabetes mellitus     takes Amaryl and Januvia daily  . Early cataracts, bilateral   . Glaucoma   . Cancer     hx of breast ca  . Endometriosis   . Postmenopausal atrophic vaginitis    uses Estring    History   Social History  . Marital Status: Married    Spouse Name: N/A    Number of Children: N/A  . Years of Education: N/A   Occupational History  . Not on file.   Social History Main Topics  . Smoking status: Never Smoker   . Smokeless tobacco: Never Used  . Alcohol Use: No  . Drug Use: No  . Sexual Activity: Not Currently   Other Topics Concern  . Not on file   Social History Narrative  . No narrative on file    Past Surgical History  Procedure Laterality Date  . Cholecystectomy  06/2008  . Tonsillectomy    . Thyroidectomy      75yrs ago  . Mastectomy Bilateral 1994 right, 1995 left  . Laparoscopic oophorectomy Right 12/2004    absent LSO  . Laparotomy    . Diagnostic laparoscopy    . Adenoidectomy      at age 21  . Excision removed from neck    . Colonoscopy  11/02/05    Diverticulosis, recheck 7 years.  Yearly HOC  . Shoulder hemi-arthroplasty  01/01/2012    Procedure: SHOULDER HEMI-ARTHROPLASTY;  Surgeon: Dominica Severin, MD;  Location: [facility] OR;  Service: Orthopedics;  Laterality: Left;  Left Shoulder Hemi Arthroplasty with Repair and Reconstruction as Necessary   . Endometrial biopsy  06/22/2011    benign polyp, no hyperplasia  . Hysteroscopy  06/22/11    PMB submucosal myoma    Family History  Problem Relation Age of Onset  . Arthritis Mother   . COPD Father   . Heart disease Father   . Hypertension Father   . Hyperlipidemia Father     Allergies  Allergen Reactions  . Cephalexin Diarrhea    Patient can't remember reaction (per chart at [facility] states diarrhea)  . Erythromycin Ethylsuccinate Hives and Diarrhea  . Nitrofurantoin Other (See Comments)    Severe headache  . Percocet [Oxycodone-Acetaminophen] Itching    Current Outpatient Prescriptions on File Prior to Visit  Medication Sig Dispense Refill  . Alpha-Lipoic Acid 600 MG CAPS Take 1 capsule by mouth daily.      [name] APAP-Isometheptene-Caffeine (PRODRIN) 500-130-20 MG TABS Take  2 tablets by mouth daily as needed. At the onset of migraine      . aspirin 325 MG tablet Take 325 mg by mouth once a week. Sunday only      . aspirin 81 MG tablet Take 81 mg by mouth. Daily Monday through Saturday      . atenolol (TENORMIN) 25 MG tablet TAKE ONE TABLET BY MOUTH DAILY  90 tablet  0  . bimatoprost (LUMIGAN) 0.03 % ophthalmic drops Place 1 drop into both eyes at bedtime.        . cholecalciferol (VITAMIN D) 1000 UNITS tablet Take 1,000 Units by mouth daily.      [name] co-enzyme Q-10 50 MG capsule Take 150 mg by mouth daily.      . cyanocobalamin (,VITAMIN B-12,) 1000 MCG/ML injection Inject 1,000 mcg into the muscle every 21 ( twenty-one) days. Last dose 1/25      . estradiol (ESTRING) 2 MG vaginal ring Place 2 mg vaginally every 3 (three) months. follow package directions       . Flaxseed, Linseed, (FLAX SEED OIL) 1000 MG CAPS Take 1 capsule by mouth daily.       [name] FLUoxetine (PROZAC) 20 MG capsule TAKE ONE CAPSULE BY MOUTH ONCE DAILY  90 capsule  3  . FREESTYLE LITE test strip USE ONE STRIP TO CHECK GLUCOSE EVERY DAY  100 each  4  . glimepiride (AMARYL) 4 MG tablet TAKE ONE TABLET BY MOUTH EVERY DAY  30 tablet  10  . Grape Seed Extract 100 MG CAPS Take 150 mg by mouth daily.       . Lactobacillus (ACIDOPHILUS PROBIOTIC) 100 MG CAPS Take 1 capsule (100 mg total) by mouth daily at 6 (six) AM.  30 capsule  11  . Lancets (FREESTYLE) lancets AS DIRECTED  100 each  0  . Liraglutide (VICTOZA) 18 MG/3ML SOLN Inject 0.3 mLs (1.8 mg total) into the skin daily.  9 mL  11  . magnesium gluconate (MAGONATE) 500 MG tablet Take 500 mg by mouth at bedtime.        . meloxicam (MOBIC) 15 MG tablet Take 1 tablet (15 mg total) by mouth daily.  90 tablet  3  . Methylcellulose, Laxative, (CITRUCEL) 500 MG TABS Take 2 tablets by mouth daily after supper.      . Multiple Vitamins-Minerals (CENTRUM SILVER PO) Take 1 tablet by mouth daily.       . Omega-3 Fatty Acids (FISH OIL) 1200 MG CAPS Take 1 capsule by  mouth 2 (two) times daily.      [name] omeprazole (PRILOSEC) 20 MG capsule       .  omeprazole (PRILOSEC) 20 MG capsule TAKE ONE CAPSULE BY MOUTH ONCE DAILY  90 capsule  3  . pramipexole (MIRAPEX) 0.5 MG tablet TAKE ONE TABLET BY MOUTH AT BEDTIME  30 tablet  10  . Selenium 200 MCG CAPS Take 200 mcg by mouth daily.      . temazepam (RESTORIL) 30 MG capsule Take 30 mg by mouth at bedtime as needed for sleep.      . Vitamin Mixture (ESTER-C) 500-60 MG TABS Take 1 tablet by mouth daily.       No current facility-administered medications on file prior to visit.    BP 160/84  Pulse 80  Temp(Src) 98.2 F (36.8 C)  Resp 16  Ht 5' 11.5" (1.816 m)  Wt 246 lb (111.585 kg)  BMI 33.84 kg/m2       Objective:   Physical Exam  Nursing note and vitals reviewed. Constitutional: She appears well-developed and well-nourished.  HENT:  Head: Normocephalic and atraumatic.  Cardiovascular: Normal rate and regular rhythm.   Pulmonary/Chest: Effort normal and breath sounds normal.  Skin: Skin is warm and dry.    Muscle wasting and lateralized neuropathy      Assessment & Plan:  Refer to Pt Monitor A1c Lipid monitoring bmet a1c and lipid today

## 2013-02-13 ENCOUNTER — Ambulatory Visit (INDEPENDENT_AMBULATORY_CARE_PROVIDER_SITE_OTHER): Payer: Medicare Other | Admitting: Obstetrics & Gynecology

## 2013-02-13 ENCOUNTER — Encounter: Payer: Self-pay | Admitting: Obstetrics & Gynecology

## 2013-02-13 VITALS — BP 158/82 | HR 80 | Resp 20 | Ht 71.5 in | Wt 252.2 lb

## 2013-02-13 DIAGNOSIS — Z01419 Encounter for gynecological examination (general) (routine) without abnormal findings: Secondary | ICD-10-CM | POA: Diagnosis not present

## 2013-02-13 DIAGNOSIS — Z124 Encounter for screening for malignant neoplasm of cervix: Secondary | ICD-10-CM | POA: Diagnosis not present

## 2013-02-13 MED ORDER — ESTRADIOL 10 MCG VA TABS: 1.0000 | ORAL_TABLET | VAGINAL | Status: DC

## 2013-02-13 NOTE — Patient Instructions (Addendum)

## 2013-02-13 NOTE — Progress Notes (Signed)
72 y.o. G0P0 MarriedCaucasianF here for annual exam.  Has questions about Estring.  Not covered at all.  Brought formulary list with her.  No vaginal bleeding since January.  Had u/s and attempted biopsy in office.  Pt refuses any future office biopsies.  Labs were done yesterday with Dr. Lovell Sheehan.  Reviewed with patient.    Patient's last menstrual period was 05/10/1992.          Sexually active: no  The current method of family planning is none.    Exercising: no  not regularly Smoker:  no  Health Maintenance: Pap:  11/03/10 WNL History of abnormal Pap:  no MMG:  1994 double mastectomy Colonoscopy:  2007 Repeat in 7 years, pt got reminder BMD:   2007, declines any treatemnt TDaP:  2009 Screening Labs: PCP, Hb today: PCP, Urine today: PCP   reports that she has never smoked. She has never used smokeless tobacco. She reports that she does not drink alcohol or use illicit drugs.  Past Medical History  Diagnosis Date  . Hx of breast cancer 08/1992 right    intraductal ca/ w/ microinvasion ER/PR -  . Osteoporosis   . Shingles   . Neuropathy   . AC (acromioclavicular) joint bone spurs   . Diverticulitis   . Heartburn   . Headache(784.0)     Migraines  . Hammer toe   . PONV (postoperative nausea and vomiting)   . Hypertension     takes Atenolol daily  . History of bronchitis   . Pneumonia     history of;many yrs ago  . History of migraine     last one about 6-37months ago;Prodrin prn  . Joint pain   . Arthritis     right knee;injections every 6months   . Scoliosis   . Sensitive skin     sees a dermatoligist yrly  . IBS (irritable bowel syndrome)   . Vitamin D deficiency     takes Vit d daily  . Diabetes mellitus     takes Amaryl and Januvia daily  . Early cataracts, bilateral   . Glaucoma   . Cancer     hx of breast ca  . Endometriosis   . Postmenopausal atrophic vaginitis     uses Estring    Past Surgical History  Procedure Laterality Date  . Cholecystectomy   06/2008  . Tonsillectomy    . Thyroidectomy      42yrs ago  . Mastectomy Bilateral 1994 right, 1995 left  . Laparoscopic oophorectomy Right 12/2004    absent LSO  . Laparotomy    . Diagnostic laparoscopy    . Adenoidectomy      at age 73  . Excision removed from neck      infected lymph node  . Colonoscopy  11/02/05    Diverticulosis, recheck 7 years.  Yearly HOC  . Shoulder hemi-arthroplasty  01/01/2012    Procedure: SHOULDER HEMI-ARTHROPLASTY;  Surgeon: Dominica Severin, MD;  Location: [facility] OR;  Service: Orthopedics;  Laterality: Left;  Left Shoulder Hemi Arthroplasty with Repair and Reconstruction as Necessary   . Endometrial biopsy  06/22/2011    benign polyp, no hyperplasia  . Hysteroscopy  06/22/11    PMB submucosal myoma  . Biopsy on back      benign  . Excision on breast      internal infected suture from breast surgery    Current Outpatient Prescriptions  Medication Sig Dispense Refill  . Alpha-Lipoic Acid 600 MG CAPS Take 1 capsule  by mouth daily.      [name] APAP-Isometheptene-Caffeine (PRODRIN) 500-130-20 MG TABS Take 2 tablets by mouth daily as needed. At the onset of migraine      . aspirin 325 MG tablet Take 325 mg by mouth once a week. Sunday only      . aspirin 81 MG tablet Take 81 mg by mouth. Daily Monday through Saturday      . atenolol (TENORMIN) 25 MG tablet TAKE ONE TABLET BY MOUTH DAILY  90 tablet  0  . bimatoprost (LUMIGAN) 0.03 % ophthalmic drops Place 1 drop into both eyes at bedtime.        . cholecalciferol (VITAMIN D) 1000 UNITS tablet Take 1,000 Units by mouth daily.      [name] co-enzyme Q-10 50 MG capsule Take 150 mg by mouth daily.      . cyanocobalamin (,VITAMIN B-12,) 1000 MCG/ML injection Inject 1,000 mcg into the muscle every 21 ( twenty-one) days. Last dose 1/25      . estradiol (ESTRING) 2 MG vaginal ring Place 2 mg vaginally every 3 (three) months. follow package directions       . Flaxseed, Linseed, (FLAX SEED OIL) 1000 MG CAPS Take 1 capsule by mouth  daily.       [name] FLUoxetine (PROZAC) 20 MG capsule TAKE ONE CAPSULE BY MOUTH ONCE DAILY  90 capsule  3  . FREESTYLE LITE test strip USE ONE STRIP TO CHECK GLUCOSE EVERY DAY  100 each  4  . glimepiride (AMARYL) 4 MG tablet TAKE ONE TABLET BY MOUTH EVERY DAY  30 tablet  10  . Grape Seed Extract 100 MG CAPS Take 150 mg by mouth daily.       . Lactobacillus (ACIDOPHILUS PROBIOTIC) 100 MG CAPS Take 1 capsule (100 mg total) by mouth daily at 6 (six) AM.  30 capsule  11  . Lancets (FREESTYLE) lancets AS DIRECTED  100 each  0  . Liraglutide (VICTOZA) 18 MG/3ML SOLN Inject 0.3 mLs (1.8 mg total) into the skin daily.  9 mL  11  . magnesium gluconate (MAGONATE) 500 MG tablet Take 500 mg by mouth at bedtime.        . meloxicam (MOBIC) 15 MG tablet Take 1 tablet (15 mg total) by mouth daily.  90 tablet  3  . Methylcellulose, Laxative, (CITRUCEL) 500 MG TABS Take 2 tablets by mouth daily after supper.      . Multiple Vitamins-Minerals (CENTRUM SILVER PO) Take 1 tablet by mouth daily.       . Omega-3 Fatty Acids (FISH OIL) 1200 MG CAPS Take 1 capsule by mouth 2 (two) times daily.      [name] omeprazole (PRILOSEC) 20 MG capsule TAKE ONE CAPSULE BY MOUTH ONCE DAILY  90 capsule  3  . pramipexole (MIRAPEX) 0.5 MG tablet TAKE ONE TABLET BY MOUTH AT BEDTIME  30 tablet  10  . Selenium 200 MCG CAPS Take 200 mcg by mouth daily.      . Vitamin Mixture (ESTER-C) 500-60 MG TABS Take 1 tablet by mouth daily.      . zaleplon (SONATA) 10 MG capsule Take 1 capsule (10 mg total) by mouth at bedtime.  30 capsule  5  . dorzolamide-timolol (COSOPT) 22.3-6.8 MG/ML ophthalmic solution        No current facility-administered medications for this visit.    Family History  Problem Relation Age of Onset  . Arthritis Mother   . COPD Father   . Heart disease Father   .  Hypertension Father   . Hyperlipidemia Father     ROS:  Pertinent items are noted in HPI.  Otherwise, a comprehensive ROS was negative.  Exam:   BP 158/82  Pulse  80  Resp 20  Ht 5' 11.5" (1.816 m)  Wt 252 lb 3.2 oz (114.397 kg)  BMI 34.69 kg/m2  LMP 05/10/1992    Height: 5' 11.5" (181.6 cm)  Ht Readings from Last 3 Encounters:  02/13/13 5' 11.5" (1.816 m)  02/12/13 5' 11.5" (1.816 m)  11/13/12 5' 11.5" (1.816 m)    General appearance: alert, cooperative and appears stated age Head: Normocephalic, without obvious abnormality, atraumatic Neck: no adenopathy, supple, symmetrical, trachea midline and thyroid normal to inspection and palpation Lungs: clear to auscultation bilaterally Breasts: bilaterally abent with no masses Heart: regular rate and rhythm Abdomen: soft, non-tender; bowel sounds normal; no masses,  no organomegaly Extremities: extremities normal, atraumatic, no cyanosis or edema Skin: Skin color, texture, turgor normal. No rashes or lesions Lymph nodes: Cervical, supraclavicular, and axillary nodes normal. No abnormal inguinal nodes palpated Neurologic: Grossly normal   Pelvic: External genitalia:  no lesions              Urethra:  normal appearing urethra with no masses, tenderness or lesions              Bartholins and Skenes: normal                 Vagina: normal appearing vagina with normal color and discharge, no lesions              Cervix: no lesions              Pap taken: yes Bimanual Exam:  Uterus:  normal size, contour, position, consistency, mobility, non-tender              Adnexa: normal adnexa and no mass, fullness, tenderness               Rectovaginal: Confirms               Anus:  normal sphincter tone, no lesions  A:  Well Woman with normal exam PMP, No HRT H/O breast cancer s/p bilateral mastectomy 1995 Atrophic changes with dyspareunia H/O RSO 8/06 by Dr. Duard Brady H/O endometriosis and uterine fibroids  P:   Mammogram not indicated due to bilateral mastectomies pap smear today Trial of Vagifem pv twice weekly.  #24/4 RF Pt to call GI about colonoscopy.  Knows due.  Guidelines may have changed  and she may not need for three more yrs but needs to check with GI. return annually or prn  An After Visit Summary was printed and given to the patient.

## 2013-02-14 LAB — IPS PAP SMEAR ONLY

## 2013-02-16 MED ORDER — FLUCONAZOLE 150 MG PO TABS: 150.0000 mg | ORAL_TABLET | Freq: Once | ORAL | Status: DC

## 2013-02-16 NOTE — Addendum Note (Signed)
Addended by: Jerene Bears on: 02/16/2013 05:18 PM   Modules accepted: Orders

## 2013-02-26 DIAGNOSIS — IMO0002 Reserved for concepts with insufficient information to code with codable children: Secondary | ICD-10-CM | POA: Diagnosis not present

## 2013-02-27 ENCOUNTER — Encounter: Payer: Self-pay | Admitting: Obstetrics & Gynecology

## 2013-03-01 DIAGNOSIS — IMO0002 Reserved for concepts with insufficient information to code with codable children: Secondary | ICD-10-CM | POA: Diagnosis not present

## 2013-03-02 ENCOUNTER — Telehealth: Payer: Self-pay | Admitting: Emergency Medicine

## 2013-03-02 NOTE — Telephone Encounter (Signed)
Called for prior authorization review. Requested fax for clinical information.  States that fax can be expected in 10 minutes.  Medicare ID # [id number]

## 2013-03-05 ENCOUNTER — Encounter: Payer: Self-pay | Admitting: Obstetrics & Gynecology

## 2013-03-07 ENCOUNTER — Other Ambulatory Visit: Payer: Self-pay | Admitting: *Deleted

## 2013-03-07 DIAGNOSIS — IMO0002 Reserved for concepts with insufficient information to code with codable children: Secondary | ICD-10-CM | POA: Diagnosis not present

## 2013-03-07 MED ORDER — AMOXICILLIN 500 MG PO CAPS: ORAL_CAPSULE | ORAL | Status: DC

## 2013-03-09 DIAGNOSIS — E1159 Type 2 diabetes mellitus with other circulatory complications: Secondary | ICD-10-CM | POA: Diagnosis not present

## 2013-03-09 DIAGNOSIS — I739 Peripheral vascular disease, unspecified: Secondary | ICD-10-CM | POA: Diagnosis not present

## 2013-03-09 DIAGNOSIS — L84 Corns and callosities: Secondary | ICD-10-CM | POA: Diagnosis not present

## 2013-03-09 DIAGNOSIS — L608 Other nail disorders: Secondary | ICD-10-CM | POA: Diagnosis not present

## 2013-03-09 DIAGNOSIS — IMO0002 Reserved for concepts with insufficient information to code with codable children: Secondary | ICD-10-CM | POA: Diagnosis not present

## 2013-03-12 DIAGNOSIS — IMO0002 Reserved for concepts with insufficient information to code with codable children: Secondary | ICD-10-CM | POA: Diagnosis not present

## 2013-03-13 DIAGNOSIS — D510 Vitamin B12 deficiency anemia due to intrinsic factor deficiency: Secondary | ICD-10-CM | POA: Diagnosis not present

## 2013-03-19 DIAGNOSIS — IMO0002 Reserved for concepts with insufficient information to code with codable children: Secondary | ICD-10-CM | POA: Diagnosis not present

## 2013-03-21 ENCOUNTER — Other Ambulatory Visit: Payer: Self-pay | Admitting: Internal Medicine

## 2013-03-23 DIAGNOSIS — IMO0002 Reserved for concepts with insufficient information to code with codable children: Secondary | ICD-10-CM | POA: Diagnosis not present

## 2013-03-27 DIAGNOSIS — IMO0002 Reserved for concepts with insufficient information to code with codable children: Secondary | ICD-10-CM | POA: Diagnosis not present

## 2013-03-29 DIAGNOSIS — IMO0002 Reserved for concepts with insufficient information to code with codable children: Secondary | ICD-10-CM | POA: Diagnosis not present

## 2013-04-03 DIAGNOSIS — D510 Vitamin B12 deficiency anemia due to intrinsic factor deficiency: Secondary | ICD-10-CM | POA: Diagnosis not present

## 2013-04-04 ENCOUNTER — Other Ambulatory Visit: Payer: Self-pay | Admitting: Internal Medicine

## 2013-04-12 DIAGNOSIS — M7120 Synovial cyst of popliteal space [Baker], unspecified knee: Secondary | ICD-10-CM | POA: Diagnosis not present

## 2013-04-13 DIAGNOSIS — IMO0002 Reserved for concepts with insufficient information to code with codable children: Secondary | ICD-10-CM | POA: Diagnosis not present

## 2013-04-16 DIAGNOSIS — IMO0002 Reserved for concepts with insufficient information to code with codable children: Secondary | ICD-10-CM | POA: Diagnosis not present

## 2013-04-18 ENCOUNTER — Encounter: Payer: Self-pay | Admitting: Internal Medicine

## 2013-04-19 DIAGNOSIS — IMO0002 Reserved for concepts with insufficient information to code with codable children: Secondary | ICD-10-CM | POA: Diagnosis not present

## 2013-04-24 DIAGNOSIS — IMO0002 Reserved for concepts with insufficient information to code with codable children: Secondary | ICD-10-CM | POA: Diagnosis not present

## 2013-04-25 DIAGNOSIS — D510 Vitamin B12 deficiency anemia due to intrinsic factor deficiency: Secondary | ICD-10-CM | POA: Diagnosis not present

## 2013-05-08 ENCOUNTER — Encounter: Payer: Self-pay | Admitting: *Deleted

## 2013-05-08 ENCOUNTER — Encounter: Payer: Self-pay | Admitting: Internal Medicine

## 2013-05-21 ENCOUNTER — Ambulatory Visit: Payer: No Typology Code available for payment source | Admitting: Obstetrics & Gynecology

## 2013-05-22 ENCOUNTER — Other Ambulatory Visit: Payer: Self-pay | Admitting: Internal Medicine

## 2013-05-22 DIAGNOSIS — D510 Vitamin B12 deficiency anemia due to intrinsic factor deficiency: Secondary | ICD-10-CM | POA: Diagnosis not present

## 2013-05-22 DIAGNOSIS — R030 Elevated blood-pressure reading, without diagnosis of hypertension: Secondary | ICD-10-CM | POA: Diagnosis not present

## 2013-05-22 DIAGNOSIS — N390 Urinary tract infection, site not specified: Secondary | ICD-10-CM | POA: Diagnosis not present

## 2013-06-05 ENCOUNTER — Ambulatory Visit (INDEPENDENT_AMBULATORY_CARE_PROVIDER_SITE_OTHER): Payer: Medicare Other | Admitting: Obstetrics & Gynecology

## 2013-06-05 ENCOUNTER — Other Ambulatory Visit: Payer: Self-pay | Admitting: Internal Medicine

## 2013-06-05 VITALS — BP 130/82 | HR 60 | Resp 16 | Ht 71.5 in | Wt 245.0 lb

## 2013-06-05 DIAGNOSIS — R5381 Other malaise: Secondary | ICD-10-CM | POA: Diagnosis not present

## 2013-06-05 DIAGNOSIS — R5383 Other fatigue: Secondary | ICD-10-CM | POA: Diagnosis not present

## 2013-06-05 DIAGNOSIS — N952 Postmenopausal atrophic vaginitis: Secondary | ICD-10-CM

## 2013-06-05 LAB — COMPREHENSIVE METABOLIC PANEL
ALT: 44 U/L — ABNORMAL HIGH (ref 0–35)
AST: 31 U/L (ref 0–37)
Albumin: 3.9 g/dL (ref 3.5–5.2)
Alkaline Phosphatase: 63 U/L (ref 39–117)
BUN: 18 mg/dL (ref 6–23)
CO2: 27 mEq/L (ref 19–32)
CREATININE: 0.75 mg/dL (ref 0.50–1.10)
Calcium: 9.3 mg/dL (ref 8.4–10.5)
Chloride: 103 mEq/L (ref 96–112)
Glucose, Bld: 90 mg/dL (ref 70–99)
Potassium: 4.2 mEq/L (ref 3.5–5.3)
Sodium: 139 mEq/L (ref 135–145)
TOTAL PROTEIN: 6.2 g/dL (ref 6.0–8.3)
Total Bilirubin: 0.4 mg/dL (ref 0.3–1.2)

## 2013-06-05 LAB — CBC
HCT: 38.3 % (ref 36.0–46.0)
Hemoglobin: 12.7 g/dL (ref 12.0–15.0)
MCH: 30 pg (ref 26.0–34.0)
MCHC: 33.2 g/dL (ref 30.0–36.0)
MCV: 90.5 fL (ref 78.0–100.0)
Platelets: 263 10*3/uL (ref 150–400)
RBC: 4.23 MIL/uL (ref 3.87–5.11)
RDW: 14.8 % (ref 11.5–15.5)
WBC: 7.6 10*3/uL (ref 4.0–10.5)

## 2013-06-05 MED ORDER — ESTRADIOL 2 MG VA RING: 2.0000 mg | VAGINAL_RING | VAGINAL | Status: DC

## 2013-06-05 NOTE — Progress Notes (Signed)
Subjective:     Patient ID: [name], female   DOB: 1940/09/16, 73 y.o.   MRN: [id number]  HPI 73 yo G0 MWF with atrophic vaginal changes who is here for estring change.  Patient has not had any trouble with the Estring.  Considered starting oral HRT due to cost but has decided against that.  She just likes the Estring and is so happy with the improvement in symptoms she gets with it.    Had GI virus about 7 days ago.  Had a day where she had diarrhea 16 times.  She is eating fine now and her bowel movements are back to "normal" for her.  She does have some   Dr. Dennie Fetters.  Has appointment with her next week.  Thinks she needs an upper endoscopy.  Review of Systems Complete ROS was negative.    Objective:   Physical Exam Gen:  Alert, NAD CV:  RRR with M/R/G Lungs:  CTA bilaterally Abdomen:  Soft, non tender, normal bowel sounds, no HSM/hernias/masses GYN:  NAEFG Vagina with old Estring which was removed.  No lesions notes.  No bleeding or discharge.  Ring replaced without difficulty with new one.    Assessment:     Symptomatic vaginal atrophy Recent diarrhea and fatigue    Plan:     Rx for Estring 2mg  every three months. CBC and CMP today.  Pt requests I call husband's cell [phone] with results as they will be traveling. Pt has appt with Dr. Olevia Perches next week to discuss possible upper GI.

## 2013-06-07 ENCOUNTER — Encounter: Payer: Self-pay | Admitting: Obstetrics & Gynecology

## 2013-06-11 ENCOUNTER — Telehealth: Payer: Self-pay | Admitting: Obstetrics & Gynecology

## 2013-06-11 DIAGNOSIS — E1159 Type 2 diabetes mellitus with other circulatory complications: Secondary | ICD-10-CM | POA: Diagnosis not present

## 2013-06-11 DIAGNOSIS — H409 Unspecified glaucoma: Secondary | ICD-10-CM | POA: Diagnosis not present

## 2013-06-11 DIAGNOSIS — I739 Peripheral vascular disease, unspecified: Secondary | ICD-10-CM | POA: Diagnosis not present

## 2013-06-11 DIAGNOSIS — H4011X0 Primary open-angle glaucoma, stage unspecified: Secondary | ICD-10-CM | POA: Diagnosis not present

## 2013-06-11 DIAGNOSIS — L84 Corns and callosities: Secondary | ICD-10-CM | POA: Diagnosis not present

## 2013-06-11 DIAGNOSIS — H2510 Age-related nuclear cataract, unspecified eye: Secondary | ICD-10-CM | POA: Diagnosis not present

## 2013-06-11 DIAGNOSIS — L608 Other nail disorders: Secondary | ICD-10-CM | POA: Diagnosis not present

## 2013-06-11 NOTE — Telephone Encounter (Signed)
Call to patient, advised that labs WNL and recent illness does not appear to have adversley affected these lab values.  If Dr Sabra Heck disagrees or has additional information, I will call her back.  She requests results be released on MyChart. Advised that this will be completed by Dr Sabra Heck at final review of this message.

## 2013-06-11 NOTE — Telephone Encounter (Signed)
Patient had appointment with Dr. Sabra Heck last Tuesday 1/27 and is calling for lab results.  Can either call back at [phone] (husband's cell) or send via MyChart.

## 2013-06-12 DIAGNOSIS — D510 Vitamin B12 deficiency anemia due to intrinsic factor deficiency: Secondary | ICD-10-CM | POA: Diagnosis not present

## 2013-06-12 NOTE — Telephone Encounter (Signed)
Looks like they have auto-released.  Encounter closed.

## 2013-06-13 ENCOUNTER — Ambulatory Visit (INDEPENDENT_AMBULATORY_CARE_PROVIDER_SITE_OTHER): Payer: Medicare Other | Admitting: Internal Medicine

## 2013-06-13 ENCOUNTER — Encounter: Payer: Self-pay | Admitting: Internal Medicine

## 2013-06-13 VITALS — BP 130/80 | HR 91 | Ht 71.0 in | Wt 247.0 lb

## 2013-06-13 DIAGNOSIS — K7689 Other specified diseases of liver: Secondary | ICD-10-CM | POA: Diagnosis not present

## 2013-06-13 DIAGNOSIS — Z1211 Encounter for screening for malignant neoplasm of colon: Secondary | ICD-10-CM

## 2013-06-13 DIAGNOSIS — K760 Fatty (change of) liver, not elsewhere classified: Secondary | ICD-10-CM

## 2013-06-13 MED ORDER — MOVIPREP 100 G PO SOLR: 1.0000 | Freq: Once | ORAL | Status: DC

## 2013-06-13 MED ORDER — DICYCLOMINE HCL 20 MG PO TABS: ORAL_TABLET | ORAL | Status: DC

## 2013-06-13 NOTE — Patient Instructions (Addendum)
You have been scheduled for a colonoscopy with propofol. Please follow written instructions given to you at your visit today.  Please pick up your prep kit at the pharmacy within the next 1-3 days. If you use inhalers (even only as needed), please bring them with you on the day of your procedure. Your physician has requested that you go to www.startemmi.com and enter the access code given to you at your visit today. This web site gives a general overview about your procedure. However, you should still follow specific instructions given to you by our office regarding your preparation for the procedure.  We have sent the following medications to your pharmacy for you to pick up at your convenience: Bentyl   Please discontinue taking Magnesium and Prilosec  You have been scheduled for an abdominal ultrasound at [facility] (1st floor of hospital) on Tuesday, 06/19/13 at 11:00 am. Please arrive 15 minutes prior to your appointment for registration. Make certain not to have anything to eat or drink 6 hours prior to your appointment. Should you need to reschedule your appointment, please contact radiology at [phone]. This test typically takes about 30 minutes to perform.  CC: Dr Benay Pillow, Dr Jana Hakim  _ x _   ORAL DIABETIC MEDICATION INSTRUCTIONS  The day before your procedure:  Take your diabetic pill as you do normally  The day of your procedure:  Do not take your diabetic pill   We will check your blood sugar levels during the admission process and again in Recovery before discharging you home  ________________________________________________________________________  _ x _   INSULIN (LONG ACTING) MEDICATION INSTRUCTIONS (Victoza)   The day before your procedure:  Take  your regular evening dose    The day of your procedure:  Do not take your morning dose   (patient advised over the phone regarding victoza instructions on 06/13/13 @ 2:45 pm -Dixon Boos,  [place])

## 2013-06-13 NOTE — Progress Notes (Signed)
[name] February 24, 1941 [id number]  Note: This dictation was prepared with Dragon digital system. Any transcriptional errors that result from this procedure are unintentional.   History of Present Illness:  This is a 73 year old white female with irritable bowel syndrome who has been experiencing urgent bowel movements within 30 minutes after she eats out at a restaurant. The episodes do not occur at home. She is an insulin-dependent diabetic. She has had 2 prior colonoscopies. Her first one was in 2000 and her most recent one was in June 2007 which showed mild diverticulosis and hemorrhoids. She has recently started taking Citrucel 2 tablets daily which resulted in improved bowel habits. An upper abdominal ultrasound in August 2010 showed her to be status post cholecystectomy , liver was fatty.. Her common bile duct was 5 mm and splenic size was 12 cm. She has a history of breast cancer. She is status post bilateral mastectomy in 1994 and 1995. She takes magnesium gluconate daily and Prilosec 20 mg daily.     Past Medical History  Diagnosis Date  . Hx of breast cancer 08/1992 right    intraductal ca/ w/ microinvasion ER/PR -  . Osteoporosis   . Shingles   . Neuropathy   . AC (acromioclavicular) joint bone spurs   . Diverticulitis   . Heartburn   . Headache(784.0)     Migraines  . Hammer toe   . PONV (postoperative nausea and vomiting)   . Hypertension     takes Atenolol daily  . History of bronchitis   . Pneumonia     history of;many yrs ago  . History of migraine     last one about 6-72months ago;Prodrin prn  . Joint pain   . Arthritis     right knee;injections every 65months   . Scoliosis   . Sensitive skin     sees a dermatoligist yrly  . IBS (irritable bowel syndrome)   . Vitamin D deficiency     takes Vit d daily  . Diabetes mellitus     takes Amaryl and Januvia daily  . Early cataracts, bilateral   . Glaucoma   . Cancer     hx of breast ca  . Endometriosis   .  Postmenopausal atrophic vaginitis     uses Estring  . Gastritis     Past Surgical History  Procedure Laterality Date  . Cholecystectomy  06/2008  . Tonsillectomy    . Thyroidectomy      40yrs ago  . Mastectomy Bilateral 1994 right, 1995 left  . Laparoscopic oophorectomy Right 12/2004    absent LSO  . Laparotomy    . Diagnostic laparoscopy    . Adenoidectomy      at age 64  . Excision removed from neck      infected lymph node  . Colonoscopy  11/02/05    Diverticulosis, recheck 7 years.  Yearly HOC  . Shoulder hemi-arthroplasty  01/01/2012    Procedure: SHOULDER HEMI-ARTHROPLASTY;  Surgeon: Roseanne Kaufman, MD;  Location: [facility];  Service: Orthopedics;  Laterality: Left;  Left Shoulder Hemi Arthroplasty with Repair and Reconstruction as Necessary   . Endometrial biopsy  06/22/2011    benign polyp, no hyperplasia  . Hysteroscopy  06/22/11    PMB submucosal myoma  . Biopsy on back      benign  . Excision on breast      internal infected suture from breast surgery    Allergies  Allergen Reactions  . Cephalexin Diarrhea  Patient can't remember reaction (per chart at [facility] states diarrhea)  . Erythromycin Ethylsuccinate Hives and Diarrhea  . Nitrofurantoin Other (See Comments)    Severe headache  . Percocet [Oxycodone-Acetaminophen] Itching    Family history and social history have been reviewed.  Review of Systems: Denies dysphagia heartburn  The remainder of the 10 point ROS is negative except as outlined in the H&P  Physical Exam: General Appearance Well developed, in no distress overweight Eyes  Non icteric  HEENT  Non traumatic, normocephalic  Mouth No lesion, tongue papillated, no cheilosis Neck Supple without adenopathy, thyroid not enlarged, no carotid bruits, no JVD Lungs Clear to auscultation bilaterally COR Normal S1, normal S2, regular rhythm, no murmur, quiet precordium Abdomen protuberant soft nontender. No ascites. Liver edge not palpable. Splenic tip not  palpable. Normal active bowel sounds Rectal normal rectal sphincter tone. Soft Hemoccult negative stool Extremities  No pedal edema Skin No lesions Neurological Alert and oriented x 3 Psychological Normal mood and affect  Assessment and Plan:   Problem #1 Irritable bowel syndrome with gastrocolic reflex following meals specifically after lunch and when eating out . This occurs several times a week. She has been improved on fiber supplements. I have asked her to decrease the size of the meal in the middle of the day . She will take Bentyl 20 mg before lunch before she goes out to eat. She does not have to take it when she is at home. We will also increase her Citrucel to 4 tablets a day instead of 2.. She is due for a colonoscopy which will be scheduled. She will stop magnesium gluconate which she takes for nocturnal rest. and Prilosec.  Problem #2 Fatty liver on ultrasound in 2010 with borderline splenomegaly. We will repeat her upper abdominal ultrasound to followup on the borderline splenomegaly.    Delfin Edis 06/13/2013

## 2013-06-15 ENCOUNTER — Ambulatory Visit (INDEPENDENT_AMBULATORY_CARE_PROVIDER_SITE_OTHER): Payer: Medicare Other | Admitting: Internal Medicine

## 2013-06-15 ENCOUNTER — Encounter: Payer: Self-pay | Admitting: Internal Medicine

## 2013-06-15 VITALS — BP 130/80 | HR 76 | Temp 97.9°F | Resp 16 | Ht 71.0 in | Wt 247.0 lb

## 2013-06-15 DIAGNOSIS — E669 Obesity, unspecified: Secondary | ICD-10-CM | POA: Insufficient documentation

## 2013-06-15 DIAGNOSIS — K7689 Other specified diseases of liver: Secondary | ICD-10-CM

## 2013-06-15 DIAGNOSIS — Z1329 Encounter for screening for other suspected endocrine disorder: Secondary | ICD-10-CM | POA: Diagnosis not present

## 2013-06-15 DIAGNOSIS — E119 Type 2 diabetes mellitus without complications: Secondary | ICD-10-CM | POA: Diagnosis not present

## 2013-06-15 DIAGNOSIS — E039 Hypothyroidism, unspecified: Secondary | ICD-10-CM | POA: Diagnosis not present

## 2013-06-15 DIAGNOSIS — E785 Hyperlipidemia, unspecified: Secondary | ICD-10-CM

## 2013-06-15 DIAGNOSIS — Z79899 Other long term (current) drug therapy: Secondary | ICD-10-CM | POA: Diagnosis not present

## 2013-06-15 LAB — POCT URINALYSIS DIPSTICK
Bilirubin, UA: NEGATIVE
Glucose, UA: NEGATIVE
KETONES UA: NEGATIVE
Leukocytes, UA: NEGATIVE
Nitrite, UA: NEGATIVE
PH UA: 7
RBC UA: NEGATIVE
SPEC GRAV UA: 1.02
Urobilinogen, UA: 0.2

## 2013-06-15 LAB — HEMOGLOBIN A1C: HEMOGLOBIN A1C: 6.4 % (ref 4.6–6.5)

## 2013-06-15 NOTE — Patient Instructions (Signed)
The patient is instructed to continue all medications as prescribed. Schedule followup with check out clerk upon leaving the clinic  

## 2013-06-15 NOTE — Progress Notes (Signed)
Pre visit review using our clinic review tool, if applicable. No additional management support is needed unless otherwise documented below in the visit note. 

## 2013-06-15 NOTE — Progress Notes (Signed)
Subjective:    Patient ID: [name], female    DOB: 1940/11/10, 73 y.o.   MRN: [id number]  Diabetes Pertinent negatives for hypoglycemia include no dizziness or headaches. Associated symptoms include fatigue and polyuria. Pertinent negatives for diabetes include no weakness.  Hypertension Pertinent negatives include no headaches, neck pain or shortness of breath.  Hyperlipidemia Associated symptoms include myalgias. Pertinent negatives include no shortness of breath.   A gastroenterology for fatty liver has an ultrasound of the abdomen plan a colonoscopy for routine not management.  Patient had an elevated SGOT last A1c was in the mid sixes CBG's are under 150    Review of Systems  Constitutional: Positive for activity change, appetite change, fatigue and unexpected weight change.  HENT: Positive for postnasal drip and rhinorrhea. Negative for congestion, ear pain and sinus pressure.   Eyes: Negative.  Negative for redness and visual disturbance.  Respiratory: Negative for cough, shortness of breath and wheezing.   Gastrointestinal: Positive for abdominal pain and abdominal distention.  Endocrine: Positive for polyuria.  Genitourinary: Positive for urgency. Negative for dysuria, frequency and menstrual problem.  Musculoskeletal: Positive for arthralgias, back pain, gait problem and myalgias. Negative for joint swelling and neck pain.  Skin: Negative for rash and wound.  Neurological: Negative for dizziness, weakness and headaches.  Hematological: Negative for adenopathy. Does not bruise/bleed easily.  Psychiatric/Behavioral: Negative for sleep disturbance and decreased concentration.   Past Medical History  Diagnosis Date  . Hx of breast cancer 08/1992 right    intraductal ca/ w/ microinvasion ER/PR -  . Osteoporosis   . Shingles   . Neuropathy   . AC (acromioclavicular) joint bone spurs   . Diverticulitis   . Heartburn   . Headache(784.0)     Migraines  . Hammer  toe   . PONV (postoperative nausea and vomiting)   . Hypertension     takes Atenolol daily  . History of bronchitis   . Pneumonia     history of;many yrs ago  . History of migraine     last one about 6-55months ago;Prodrin prn  . Joint pain   . Arthritis     right knee;injections every 84months   . Scoliosis   . Sensitive skin     sees a dermatoligist yrly  . IBS (irritable bowel syndrome)   . Vitamin D deficiency     takes Vit d daily  . Diabetes mellitus     takes Amaryl and Januvia daily  . Early cataracts, bilateral   . Glaucoma   . Cancer     hx of breast ca  . Endometriosis   . Postmenopausal atrophic vaginitis     uses Estring  . Gastritis     History   Social History  . Marital Status: Married    Spouse Name: N/A    Number of Children: N/A  . Years of Education: N/A   Occupational History  . Not on file.   Social History Main Topics  . Smoking status: Never Smoker   . Smokeless tobacco: Never Used  . Alcohol Use: No  . Drug Use: No  . Sexual Activity: No   Other Topics Concern  . Not on file   Social History Narrative  . No narrative on file    Past Surgical History  Procedure Laterality Date  . Cholecystectomy  06/2008  . Tonsillectomy    . Thyroidectomy      54yrs ago  . Mastectomy Bilateral 1994 right, 1995 left  .  Laparoscopic oophorectomy Right 12/2004    absent LSO  . Laparotomy    . Diagnostic laparoscopy    . Adenoidectomy      at age 66  . Excision removed from neck      infected lymph node  . Colonoscopy  11/02/05    Diverticulosis, recheck 7 years.  Yearly HOC  . Shoulder hemi-arthroplasty  01/01/2012    Procedure: SHOULDER HEMI-ARTHROPLASTY;  Surgeon: Roseanne Kaufman, MD;  Location: [facility];  Service: Orthopedics;  Laterality: Left;  Left Shoulder Hemi Arthroplasty with Repair and Reconstruction as Necessary   . Endometrial biopsy  06/22/2011    benign polyp, no hyperplasia  . Hysteroscopy  06/22/11    PMB submucosal myoma  .  Biopsy on back      benign  . Excision on breast      internal infected suture from breast surgery    Family History  Problem Relation Age of Onset  . Arthritis Mother   . COPD Father   . Heart disease Father   . Hypertension Father   . Hyperlipidemia Father     Allergies  Allergen Reactions  . Cephalexin Diarrhea    Patient can't remember reaction (per chart at [facility] states diarrhea)  . Erythromycin Ethylsuccinate Hives and Diarrhea  . Nitrofurantoin Other (See Comments)    Severe headache  . Percocet [Oxycodone-Acetaminophen] Itching    Current Outpatient Prescriptions on File Prior to Visit  Medication Sig Dispense Refill  . amoxicillin (AMOXIL) 500 MG capsule Take 4 tabs 1/2 to 1 hour prior to dental work  12 capsule  1  . APAP-Isometheptene-Caffeine (PRODRIN) 500-130-20 MG TABS Take 2 tablets by mouth daily as needed. At the onset of migraine      . aspirin 325 MG tablet Take 325 mg by mouth once a week. Sunday only      . aspirin 81 MG tablet Take 81 mg by mouth. Daily Monday through Saturday      . atenolol (TENORMIN) 25 MG tablet TAKE ONE TABLET BY MOUTH ONCE DAILY  90 tablet  3  . bimatoprost (LUMIGAN) 0.03 % ophthalmic drops Place 1 drop into both eyes at bedtime.        . cholecalciferol (VITAMIN D) 1000 UNITS tablet Take 1,000 Units by mouth daily.      [name] co-enzyme Q-10 50 MG capsule Take 150 mg by mouth daily.      . cyanocobalamin (,VITAMIN B-12,) 1000 MCG/ML injection Inject 1,000 mcg into the muscle every 21 ( twenty-one) days. Last dose 1/25      . dicyclomine (BENTYL) 20 MG tablet Take 1 tablet before lunch daily as needed for diarrhea  30 tablet  1  . dorzolamide-timolol (COSOPT) 22.3-6.8 MG/ML ophthalmic solution Place into both eyes 2 (two) times daily.       [name] estradiol (ESTRING) 2 MG vaginal ring Place 2 mg vaginally every 3 (three) months. follow package directions  1 each  4  . Flaxseed, Linseed, (FLAX SEED OIL) 1000 MG CAPS Take 1 capsule by mouth daily.        [name] FLUoxetine (PROZAC) 20 MG capsule TAKE ONE CAPSULE BY MOUTH ONCE DAILY  90 capsule  3  . FREESTYLE LITE test strip USE ONE STRIP TO CHECK GLUCOSE EVERY DAY  100 each  4  . glimepiride (AMARYL) 4 MG tablet TAKE ONE TABLET BY MOUTH EVERY DAY.  30 tablet  11  . Grape Seed Extract 100 MG CAPS Take 150 mg by mouth daily.       [name]  Lactobacillus (ACIDOPHILUS PROBIOTIC) 100 MG CAPS Take 1 capsule (100 mg total) by mouth daily at 6 (six) AM.  30 capsule  11  . Lancets (FREESTYLE) lancets AS DIRECTED  100 each  0  . meloxicam (MOBIC) 15 MG tablet Take 1 tablet (15 mg total) by mouth daily.  90 tablet  3  . Methylcellulose, Laxative, (CITRUCEL) 500 MG TABS Take 4 tablets by mouth daily after supper.       [name] MOVIPREP 100 G SOLR Take 1 kit (200 g total) by mouth once.  1 kit  0  . Multiple Vitamins-Minerals (CENTRUM SILVER PO) Take 1 tablet by mouth daily.       . Omega-3 Fatty Acids (FISH OIL) 1200 MG CAPS Take 1 capsule by mouth 2 (two) times daily.      . pramipexole (MIRAPEX) 0.5 MG tablet TAKE ONE TABLET BY MOUTH AT BEDTIME  30 tablet  10  . Selenium 200 MCG CAPS Take 200 mcg by mouth daily.      . temazepam (RESTORIL) 30 MG capsule TAKE ONE CAPSULE BY MOUTH AT BEDTIME AS NEEDED FOR SLEEP  30 capsule  2  . VICTOZA 18 MG/3ML SOPN INJECT 0.3 MLS (1.8 MG) INTO THE SKIN DAILY  9 mL  6  . Vitamin Mixture (ESTER-C) 500-60 MG TABS Take 1 tablet by mouth daily.       No current facility-administered medications on file prior to visit.    BP 130/80  Pulse 76  Temp(Src) 97.9 F (36.6 C)  Resp 16  Ht $R'5\' 11"'Wm$  (1.803 m)  Wt 247 lb (112.038 kg)  BMI 34.46 kg/m2  LMP 05/10/1992       Objective:   Physical Exam  Nursing note and vitals reviewed. Constitutional: She is oriented to person, place, and time. She appears well-nourished.  HENT:  Head: Normocephalic and atraumatic.  Eyes: Conjunctivae are normal.  Neck: Normal range of motion. Neck supple.  Cardiovascular: Normal rate.   Murmur  heard. Pulmonary/Chest: Effort normal and breath sounds normal.  Abdominal: Bowel sounds are normal. There is tenderness.  Musculoskeletal: She exhibits edema and tenderness.  Neurological: She is alert and oriented to person, place, and time.          Assessment & Plan:  Spiral of inactivity Easy fatigability DM monitoring Check TSH and liver If liver enzymes increased will need to add infection screening  Mood and its roll.

## 2013-06-15 NOTE — Addendum Note (Signed)
Addended by: Elmer Picker on: 06/15/2013 12:43 PM   Modules accepted: Orders

## 2013-06-18 LAB — HEPATIC FUNCTION PANEL
ALT: 39 U/L — ABNORMAL HIGH (ref 0–35)
AST: 32 U/L (ref 0–37)
Albumin: 3.8 g/dL (ref 3.5–5.2)
Alkaline Phosphatase: 65 U/L (ref 39–117)
BILIRUBIN TOTAL: 0.5 mg/dL (ref 0.3–1.2)
Bilirubin, Direct: 0 mg/dL (ref 0.0–0.3)
Total Protein: 7 g/dL (ref 6.0–8.3)

## 2013-06-18 LAB — MICROALBUMIN / CREATININE URINE RATIO
Creatinine,U: 127.3 mg/dL
MICROALB UR: 4 mg/dL — AB (ref 0.0–1.9)
Microalb Creat Ratio: 3.1 mg/g (ref 0.0–30.0)

## 2013-06-18 LAB — T4, FREE: FREE T4: 0.95 ng/dL (ref 0.60–1.60)

## 2013-06-18 LAB — TSH: TSH: 1.25 u[IU]/mL (ref 0.35–5.50)

## 2013-06-19 ENCOUNTER — Ambulatory Visit (HOSPITAL_COMMUNITY)
Admission: RE | Admit: 2013-06-19 | Discharge: 2013-06-19 | Disposition: A | Discharge status: Home / Self Care | Payer: Medicare Other | Source: Ambulatory Visit | Attending: Internal Medicine | Admitting: Internal Medicine

## 2013-06-19 DIAGNOSIS — K7689 Other specified diseases of liver: Secondary | ICD-10-CM | POA: Insufficient documentation

## 2013-06-19 DIAGNOSIS — K760 Fatty (change of) liver, not elsewhere classified: Secondary | ICD-10-CM

## 2013-06-20 ENCOUNTER — Telehealth: Payer: Self-pay

## 2013-06-20 ENCOUNTER — Other Ambulatory Visit: Payer: Self-pay | Admitting: Internal Medicine

## 2013-06-20 NOTE — Telephone Encounter (Signed)
Relevant patient education assigned to patient using Emmi. ° °

## 2013-06-21 ENCOUNTER — Other Ambulatory Visit: Payer: Self-pay | Admitting: Internal Medicine

## 2013-06-22 ENCOUNTER — Ambulatory Visit (AMBULATORY_SURGERY_CENTER): Payer: Medicare Other | Admitting: Internal Medicine

## 2013-06-22 ENCOUNTER — Encounter: Payer: Self-pay | Admitting: Internal Medicine

## 2013-06-22 VITALS — BP 127/72 | HR 82 | Temp 96.2°F | Resp 19

## 2013-06-22 DIAGNOSIS — E669 Obesity, unspecified: Secondary | ICD-10-CM | POA: Diagnosis not present

## 2013-06-22 DIAGNOSIS — K5730 Diverticulosis of large intestine without perforation or abscess without bleeding: Secondary | ICD-10-CM | POA: Diagnosis not present

## 2013-06-22 DIAGNOSIS — Z1211 Encounter for screening for malignant neoplasm of colon: Secondary | ICD-10-CM

## 2013-06-22 DIAGNOSIS — E119 Type 2 diabetes mellitus without complications: Secondary | ICD-10-CM | POA: Diagnosis not present

## 2013-06-22 DIAGNOSIS — I10 Essential (primary) hypertension: Secondary | ICD-10-CM | POA: Diagnosis not present

## 2013-06-22 LAB — GLUCOSE, CAPILLARY
GLUCOSE-CAPILLARY: 124 mg/dL — AB (ref 70–99)
Glucose-Capillary: 106 mg/dL — ABNORMAL HIGH (ref 70–99)

## 2013-06-22 MED ORDER — SODIUM CHLORIDE 0.9 % IV SOLN: 500.0000 mL | INTRAVENOUS | Status: DC

## 2013-06-22 NOTE — Patient Instructions (Signed)
YOU HAD AN ENDOSCOPIC PROCEDURE TODAY AT THE [facility]: Refer to the procedure report that was given to you for any specific questions about what was found during the examination.  If the procedure report does not answer your questions, please call your gastroenterologist to clarify.  If you requested that your care partner not be given the details of your procedure findings, then the procedure report has been included in a sealed envelope for you to review at your convenience later.  YOU SHOULD EXPECT: Some feelings of bloating in the abdomen. Passage of more gas than usual.  Walking can help get rid of the air that was put into your GI tract during the procedure and reduce the bloating. If you had a lower endoscopy (such as a colonoscopy or flexible sigmoidoscopy) you may notice spotting of blood in your stool or on the toilet paper. If you underwent a bowel prep for your procedure, then you may not have a normal bowel movement for a few days.  DIET: Your first meal following the procedure should be a light meal and then it is ok to progress to your normal diet.  A half-sandwich or bowl of soup is an example of a good first meal.  Heavy or fried foods are harder to digest and may make you feel nauseous or bloated.  Likewise meals heavy in dairy and vegetables can cause extra gas to form and this can also increase the bloating.  Drink plenty of fluids but you should avoid alcoholic beverages for 24 hours.  ACTIVITY: Your care partner should take you home directly after the procedure.  You should plan to take it easy, moving slowly for the rest of the day.  You can resume normal activity the day after the procedure however you should NOT DRIVE or use heavy machinery for 24 hours (because of the sedation medicines used during the test).    SYMPTOMS TO REPORT IMMEDIATELY: A gastroenterologist can be reached at any hour.  During normal business hours, 8:30 AM to 5:00 PM Monday through Friday,  call [phone].  After hours and on weekends, please call the GI answering service at [phone] who will take a message and have the physician on call contact you.   Following lower endoscopy (colonoscopy or flexible sigmoidoscopy):  Excessive amounts of blood in the stool  Significant tenderness or worsening of abdominal pains  Swelling of the abdomen that is new, acute  Fever of 100F or higher    FOLLOW UP: If any biopsies were taken you will be contacted by phone or by letter within the next 1-3 weeks.  Call your gastroenterologist if you have not heard about the biopsies in 3 weeks.  Our staff will call the home number listed on your records the next business day following your procedure to check on you and address any questions or concerns that you may have at that time regarding the information given to you following your procedure. This is a courtesy call and so if there is no answer at the home number and we have not heard from you through the emergency physician on call, we will assume that you have returned to your regular daily activities without incident.  SIGNATURES/CONFIDENTIALITY: You and/or your care partner have signed paperwork which will be entered into your electronic medical record.  These signatures attest to the fact that that the information above on your After Visit Summary has been reviewed and is understood.  Full responsibility of the confidentiality   of this discharge information lies with you and/or your care-partner.  Hemorrhoid, diverticulosis and high fiber diet information given.  No further colonoscopies due to age!

## 2013-06-22 NOTE — Op Note (Signed)
[place]  [address] [place], [id number]   COLONOSCOPY PROCEDURE REPORT  PATIENT: Olivia, Hahn  MR#: [id number] BIRTHDATE: 1940/10/08 , 10  yrs. old GENDER: Female ENDOSCOPIST: Lafayette Dragon, MD REFERRED QD:IYME Vear Clock, M.D., Dr G.Magrinat PROCEDURE DATE:  06/22/2013 PROCEDURE:   Colonoscopy, screening First Screening Colonoscopy - Avg.  risk and is 50 yrs.  old or older - No.  Prior Negative Screening - Now for repeat screening. 10 or more years since last screening Prior Negative Screening - Now for repeat screening.  Above average risk  History of Adenoma - Now for follow-up colonoscopy & has been > or = to 3 yrs.  N/A Polyps Removed Today? No.  Recommend repeat exam, <10 yrs? No. ASA CLASS:   Class II INDICATIONS: MEDICATIONS: MAC sedation, administered by CRNA and Propofol (Diprivan) 130 mg IV  DESCRIPTION OF PROCEDURE:   After the risks benefits and alternatives of the procedure were thoroughly explained, informed consent was obtained.  A digital rectal exam revealed no abnormalities of the rectum.   The LB [id number]  endoscope was introduced through the anus and advanced to the cecum, which was identified by both the appendix and ileocecal valve. No adverse events experienced.   The quality of the prep was good, using MoviPrep  The instrument was then slowly withdrawn as the colon was fully examined.      COLON FINDINGS: There were scattered diverticuli in the sigmoid colon Small internal hemorrhoids were found.  Retroflexed views revealed no abnormalities. The time to cecum=1 minutes 59 seconds. Withdrawal time=6 minutes 27 seconds.  The scope was withdrawn and the procedure completed. COMPLICATIONS: There were no complications.  ENDOSCOPIC IMPRESSION: Small internal hemorrhoids Mild sigmoid diverticulosis  RECOMMENDATIONS: high fiber diet no recall  due to age   eSigned:  Lafayette Dragon, MD 06/22/2013 9:46  AM   cc:   PATIENT NAME:  Olivia, Hahn MR#: [id number]

## 2013-06-22 NOTE — Progress Notes (Signed)
Procdure ends, to recovery, report given and VSS.

## 2013-06-22 NOTE — Progress Notes (Signed)
NO EGG OR SOY ALLERGY. EWM NO PROBLEMS WITH PAST SEDATION. EMW 

## 2013-06-25 ENCOUNTER — Telehealth: Payer: Self-pay | Admitting: *Deleted

## 2013-06-25 NOTE — Telephone Encounter (Signed)
  Follow up Call-  Call back number 06/22/2013  Post procedure Call Back phone  # [phone]  Permission to leave phone message Yes     Patient questions:  Do you have a fever, pain , or abdominal swelling? no Pain Score  0 *  Have you tolerated food without any problems? yes  Have you been able to return to your normal activities? yes  Do you have any questions about your discharge instructions: Diet   no Medications  no Follow up visit  no  Do you have questions or concerns about your Care? no  Actions: * If pain score is 4 or above: No action needed, pain <4.

## 2013-07-08 ENCOUNTER — Other Ambulatory Visit: Payer: Self-pay | Admitting: Internal Medicine

## 2013-07-09 DIAGNOSIS — D510 Vitamin B12 deficiency anemia due to intrinsic factor deficiency: Secondary | ICD-10-CM | POA: Diagnosis not present

## 2013-07-17 DIAGNOSIS — M1710 Unilateral primary osteoarthritis, unspecified knee: Secondary | ICD-10-CM | POA: Diagnosis not present

## 2013-07-18 DIAGNOSIS — M1710 Unilateral primary osteoarthritis, unspecified knee: Secondary | ICD-10-CM | POA: Diagnosis not present

## 2013-07-25 DIAGNOSIS — M1710 Unilateral primary osteoarthritis, unspecified knee: Secondary | ICD-10-CM | POA: Diagnosis not present

## 2013-07-31 DIAGNOSIS — D510 Vitamin B12 deficiency anemia due to intrinsic factor deficiency: Secondary | ICD-10-CM | POA: Diagnosis not present

## 2013-08-02 DIAGNOSIS — M1710 Unilateral primary osteoarthritis, unspecified knee: Secondary | ICD-10-CM | POA: Diagnosis not present

## 2013-08-08 DIAGNOSIS — M1710 Unilateral primary osteoarthritis, unspecified knee: Secondary | ICD-10-CM | POA: Diagnosis not present

## 2013-08-16 DIAGNOSIS — M1710 Unilateral primary osteoarthritis, unspecified knee: Secondary | ICD-10-CM | POA: Diagnosis not present

## 2013-08-23 ENCOUNTER — Encounter: Payer: Self-pay | Admitting: Internal Medicine

## 2013-09-03 ENCOUNTER — Other Ambulatory Visit: Payer: Self-pay | Admitting: Internal Medicine

## 2013-09-03 ENCOUNTER — Ambulatory Visit: Payer: Medicare Other | Admitting: Obstetrics & Gynecology

## 2013-09-03 ENCOUNTER — Encounter: Payer: Self-pay | Admitting: Internal Medicine

## 2013-09-03 DIAGNOSIS — R0989 Other specified symptoms and signs involving the circulatory and respiratory systems: Secondary | ICD-10-CM

## 2013-09-07 ENCOUNTER — Encounter (HOSPITAL_COMMUNITY): Payer: Medicare Other

## 2013-09-10 ENCOUNTER — Encounter: Payer: Self-pay | Admitting: Internal Medicine

## 2013-09-10 DIAGNOSIS — E041 Nontoxic single thyroid nodule: Secondary | ICD-10-CM

## 2013-09-11 ENCOUNTER — Encounter: Payer: Self-pay | Admitting: Internal Medicine

## 2013-09-11 DIAGNOSIS — E538 Deficiency of other specified B group vitamins: Secondary | ICD-10-CM | POA: Diagnosis not present

## 2013-09-12 ENCOUNTER — Ambulatory Visit (HOSPITAL_COMMUNITY): Payer: Medicare Other | Attending: Cardiology | Admitting: Cardiology

## 2013-09-12 DIAGNOSIS — E785 Hyperlipidemia, unspecified: Secondary | ICD-10-CM | POA: Insufficient documentation

## 2013-09-12 DIAGNOSIS — E119 Type 2 diabetes mellitus without complications: Secondary | ICD-10-CM | POA: Insufficient documentation

## 2013-09-12 DIAGNOSIS — I6529 Occlusion and stenosis of unspecified carotid artery: Secondary | ICD-10-CM | POA: Diagnosis not present

## 2013-09-12 DIAGNOSIS — I658 Occlusion and stenosis of other precerebral arteries: Secondary | ICD-10-CM | POA: Insufficient documentation

## 2013-09-12 DIAGNOSIS — R0989 Other specified symptoms and signs involving the circulatory and respiratory systems: Secondary | ICD-10-CM | POA: Diagnosis not present

## 2013-09-12 NOTE — Progress Notes (Signed)
Carotid duplex complete 

## 2013-09-13 ENCOUNTER — Encounter: Payer: Self-pay | Admitting: Obstetrics & Gynecology

## 2013-09-13 ENCOUNTER — Ambulatory Visit (INDEPENDENT_AMBULATORY_CARE_PROVIDER_SITE_OTHER): Payer: Medicare Other | Admitting: Obstetrics & Gynecology

## 2013-09-13 VITALS — BP 136/78 | HR 68 | Resp 20 | Ht 71.5 in | Wt 251.0 lb

## 2013-09-13 DIAGNOSIS — N952 Postmenopausal atrophic vaginitis: Secondary | ICD-10-CM

## 2013-09-13 DIAGNOSIS — N766 Ulceration of vulva: Secondary | ICD-10-CM

## 2013-09-13 DIAGNOSIS — I6529 Occlusion and stenosis of unspecified carotid artery: Secondary | ICD-10-CM | POA: Diagnosis not present

## 2013-09-13 MED ORDER — MUPIROCIN 2 % EX OINT: 1.0000 "application " | TOPICAL_OINTMENT | Freq: Four times a day (QID) | CUTANEOUS | Status: DC

## 2013-09-13 NOTE — Progress Notes (Signed)
Patient ID: [name], female   DOB: January 07, 1941, 73 y.o.   MRN: [id number]  Subjective:     Patient ID: [name], female   DOB: April 05, 1941, 73 y.o.   MRN: [id number]  HPI 73 yo G0 MWF with atrophic vaginal changes who is here for estring change.  Patient has not had any trouble with the Estring.  No vaginal bleeding or discharge.  Dr. Arnoldo Morale is going to administration.  Pt wants my opinion about who to see.  She would prefer Internal medicine.    Reports haivng area on right buttocks that feels like it starts as a blister, then "bursts" and then bleeds.  Has bled through a pair of white pants and onto her sheet at night.  Noticed "several weeks ago".  Hasn't told anyone about it except her husband.  Non tender.  Using a steroid ointment on it.  No recent travels.   Review of Systems Complete ROS was negative.    Objective:   Physical Exam Gen:  Alert, NAD GYN:  NAEFG, vaginal without lesions, cervix without lesions, Estring removed without difficulty.  New estring replaced.  No lesions notes.  No bleeding or discharge.      Recommended biopsy of lesion.  Verbal consent obtained.  Risks and benefits disucssed.  Area cleansed with Betadine x 3.  1/2 cc of 1% plain Lidocaine instilled.  47mm punch biopsy obtained.  Silver nitrate used for excellent hemostasis.  Pt tolerated procedure well.  Dressing applied.        Assessment:     Symptomatic vaginal atrophy, using Estring Right buttocks ulceration    Plan:     Estring replaced.  Repeat 3 months. Skin biopsy on right buttocks.  Pt will be called with pathology and plan made for treatment. Bacitracin ointment to lesion.  Rx to pharmacy.

## 2013-09-14 DIAGNOSIS — I739 Peripheral vascular disease, unspecified: Secondary | ICD-10-CM | POA: Diagnosis not present

## 2013-09-14 DIAGNOSIS — L84 Corns and callosities: Secondary | ICD-10-CM | POA: Diagnosis not present

## 2013-09-14 DIAGNOSIS — E1159 Type 2 diabetes mellitus with other circulatory complications: Secondary | ICD-10-CM | POA: Diagnosis not present

## 2013-09-14 DIAGNOSIS — L608 Other nail disorders: Secondary | ICD-10-CM | POA: Diagnosis not present

## 2013-09-17 ENCOUNTER — Other Ambulatory Visit: Payer: Self-pay | Admitting: Internal Medicine

## 2013-09-21 ENCOUNTER — Encounter: Payer: Self-pay | Admitting: Internal Medicine

## 2013-09-21 ENCOUNTER — Encounter: Payer: Self-pay | Admitting: Obstetrics & Gynecology

## 2013-09-21 DIAGNOSIS — E0789 Other specified disorders of thyroid: Secondary | ICD-10-CM

## 2013-09-24 ENCOUNTER — Telehealth: Payer: Self-pay | Admitting: *Deleted

## 2013-09-24 NOTE — Telephone Encounter (Signed)
Follow-up call to patient to check on healing of biopsy site. Patient reports are is not fully healed but her husband is treating it and feels the are is healing and improving every day.  She will call to have dr Sabra Heck look at [facility] if it does not continue to improve. Instructed she should call if are gives her any problems in future as well and she is agreeable.  Routing to provider for final review. Patient agreeable to disposition. Will close encounter

## 2013-09-24 NOTE — Telephone Encounter (Signed)
Can you put on the schedule for recheck one month.  If it is not fully gone then, I will remove.

## 2013-09-26 ENCOUNTER — Encounter: Payer: Self-pay | Admitting: Internal Medicine

## 2013-09-26 ENCOUNTER — Ambulatory Visit
Admission: RE | Admit: 2013-09-26 | Discharge: 2013-09-26 | Disposition: A | Discharge status: Home / Self Care | Payer: Medicare Other | Source: Ambulatory Visit | Attending: Internal Medicine | Admitting: Internal Medicine

## 2013-09-26 DIAGNOSIS — E0789 Other specified disorders of thyroid: Secondary | ICD-10-CM

## 2013-09-26 DIAGNOSIS — E119 Type 2 diabetes mellitus without complications: Secondary | ICD-10-CM

## 2013-09-26 DIAGNOSIS — E042 Nontoxic multinodular goiter: Secondary | ICD-10-CM | POA: Diagnosis not present

## 2013-09-27 ENCOUNTER — Telehealth: Payer: Self-pay | Admitting: Emergency Medicine

## 2013-09-27 NOTE — Telephone Encounter (Signed)
Attempted phone call to home number. Unable to leave message. Home phone only number listed in designated party release form.

## 2013-09-27 NOTE — Telephone Encounter (Signed)
Message copied by Michele Mcalpine on Thu Sep 27, 2013  1:13 PM ------      Message from: Megan Salon      Created: Thu Sep 20, 2013  5:47 PM       Please inform pt her biopsy showed no abnormal cells.  Exact cause of the area is not clear.  If she is still having issues, it needs to be fully excised and I can do this in the office. ------

## 2013-10-02 NOTE — Addendum Note (Signed)
Addended by: Colleen Can on: 10/02/2013 05:15 PM   Modules accepted: Orders

## 2013-10-04 NOTE — Telephone Encounter (Signed)
Patient returned call and message from Dr. Sabra Heck given. She states that "the area is 100% cleared up and I'm feeling much better!". Advised to monitor area of prior lesion and call back if symptoms return or change. She is agreeable.  Routing to provider for final review. Patient agreeable to disposition. Will close encounter

## 2013-10-04 NOTE — Telephone Encounter (Signed)
Message left to return call to Makalah Asberry at [phone].    

## 2013-10-10 ENCOUNTER — Encounter: Payer: Self-pay | Admitting: Internal Medicine

## 2013-10-10 ENCOUNTER — Encounter: Payer: Self-pay | Admitting: Obstetrics & Gynecology

## 2013-10-12 DIAGNOSIS — D2370 Other benign neoplasm of skin of unspecified lower limb, including hip: Secondary | ICD-10-CM | POA: Diagnosis not present

## 2013-10-12 DIAGNOSIS — L738 Other specified follicular disorders: Secondary | ICD-10-CM | POA: Diagnosis not present

## 2013-10-12 DIAGNOSIS — D1801 Hemangioma of skin and subcutaneous tissue: Secondary | ICD-10-CM | POA: Diagnosis not present

## 2013-10-12 DIAGNOSIS — L821 Other seborrheic keratosis: Secondary | ICD-10-CM | POA: Diagnosis not present

## 2013-10-13 ENCOUNTER — Encounter: Payer: Self-pay | Admitting: Obstetrics & Gynecology

## 2013-10-15 ENCOUNTER — Telehealth: Payer: Self-pay | Admitting: Internal Medicine

## 2013-10-15 ENCOUNTER — Other Ambulatory Visit: Payer: Self-pay | Admitting: Internal Medicine

## 2013-10-15 DIAGNOSIS — D518 Other vitamin B12 deficiency anemias: Secondary | ICD-10-CM | POA: Diagnosis not present

## 2013-10-15 NOTE — Telephone Encounter (Signed)
Pt came in.Olivia KitchenMarland KitchenMarland KitchenMarland Kitchenpt is requesting Dr. Legrand Como Altheimer at [facility] for her Endocrinology referral, I do not see a current referral, pt states last referral was cancelled because she was told it was for her diabetes and that is not what she expected.  Pt also states she was supposed to have a biopspy of thyroid and wants to know if this is supposed to take place before or after she sees an endocrinologist.  [name]...Olivia Kitchenpt will be relocating to [facility], Dr. Shon Baton

## 2013-10-16 ENCOUNTER — Telehealth: Payer: Self-pay | Admitting: Internal Medicine

## 2013-10-16 ENCOUNTER — Other Ambulatory Visit: Payer: Self-pay | Admitting: Internal Medicine

## 2013-10-16 DIAGNOSIS — E041 Nontoxic single thyroid nodule: Secondary | ICD-10-CM

## 2013-10-16 NOTE — Telephone Encounter (Signed)
Left message on machine for patient to schedule an appointment with Endo.  Bx scheduled.

## 2013-10-16 NOTE — Telephone Encounter (Signed)
Pt called stating she needs a referral to see an Endocrinologist for her thyroid nodule.  She wants to see Dr. Legrand Como Altheimer (p) [phone] and (f) [phone] DX: 241.0 Thyroid Nodule.  [name] aware of this pt and can help you with any extra details needed.

## 2013-10-22 DIAGNOSIS — H2510 Age-related nuclear cataract, unspecified eye: Secondary | ICD-10-CM | POA: Diagnosis not present

## 2013-10-22 DIAGNOSIS — H4011X0 Primary open-angle glaucoma, stage unspecified: Secondary | ICD-10-CM | POA: Diagnosis not present

## 2013-10-22 DIAGNOSIS — H409 Unspecified glaucoma: Secondary | ICD-10-CM | POA: Diagnosis not present

## 2013-10-23 ENCOUNTER — Ambulatory Visit
Admission: RE | Admit: 2013-10-23 | Discharge: 2013-10-23 | Disposition: A | Discharge status: Home / Self Care | Payer: Medicare Other | Source: Ambulatory Visit | Attending: Internal Medicine | Admitting: Internal Medicine

## 2013-10-23 ENCOUNTER — Encounter: Payer: Self-pay | Admitting: Endocrinology

## 2013-10-23 ENCOUNTER — Other Ambulatory Visit (HOSPITAL_COMMUNITY)
Admission: RE | Admit: 2013-10-23 | Discharge: 2013-10-23 | Disposition: A | Discharge status: Home / Self Care | Payer: Medicare Other | Source: Ambulatory Visit | Attending: Interventional Radiology | Admitting: Interventional Radiology

## 2013-10-23 ENCOUNTER — Other Ambulatory Visit: Payer: Self-pay | Admitting: Internal Medicine

## 2013-10-23 DIAGNOSIS — E041 Nontoxic single thyroid nodule: Secondary | ICD-10-CM

## 2013-10-23 DIAGNOSIS — E042 Nontoxic multinodular goiter: Secondary | ICD-10-CM | POA: Diagnosis not present

## 2013-10-29 ENCOUNTER — Telehealth: Payer: Self-pay | Admitting: Obstetrics & Gynecology

## 2013-10-29 NOTE — Telephone Encounter (Signed)
Patient has a question regarding a past surgery she had with Dr.Romine.

## 2013-10-30 DIAGNOSIS — M7120 Synovial cyst of popliteal space [Baker], unspecified knee: Secondary | ICD-10-CM | POA: Diagnosis not present

## 2013-10-30 NOTE — Telephone Encounter (Signed)
Spoke with patient. She has questions regarding surgery date with Dr. Joan Flores. Per paper chart in office, on 06/22/11 patient had hysteroscopy with resection of polyp done by Dr. Joan Flores. She needs this surgery date for her records.  Date and type of surgery given. Patient will follow up prn.   Routing to provider for final review. Patient agreeable to disposition. Will close encounter

## 2013-11-02 ENCOUNTER — Telehealth: Payer: Self-pay | Admitting: Internal Medicine

## 2013-11-02 NOTE — Telephone Encounter (Signed)
Pt would like a call back about her biopsy

## 2013-11-05 DIAGNOSIS — R5381 Other malaise: Secondary | ICD-10-CM | POA: Diagnosis not present

## 2013-11-05 DIAGNOSIS — R5383 Other fatigue: Secondary | ICD-10-CM | POA: Diagnosis not present

## 2013-11-05 DIAGNOSIS — E042 Nontoxic multinodular goiter: Secondary | ICD-10-CM | POA: Diagnosis not present

## 2013-11-06 ENCOUNTER — Telehealth: Payer: Self-pay | Admitting: Internal Medicine

## 2013-11-06 ENCOUNTER — Telehealth: Payer: Self-pay | Admitting: *Deleted

## 2013-11-06 NOTE — Telephone Encounter (Signed)
Follow up call to patient, see previous phone note.  Recheck biopsy site from [facility] 2015. If not fully healed needs OV. Female states patient is asleep, LMTCB at her convenience, no emergency.

## 2013-11-06 NOTE — Telephone Encounter (Signed)
Pt would like to become new pt to dr kim. Can I sch?

## 2013-11-06 NOTE — Telephone Encounter (Signed)
Patient returned call. She states that biopsy site is well healed and she is not having any issues with biopsy area. She states she has been feeling increased fatigue and states "I just don't feel well."  Patient states she saw Dr. Elyse Hsu yesterday and had labs rechecked with tsh, t3 and t4 and is waiting to hear back about results. States that Dr. Elyse Hsu feels it is mood related and is waiting for results to move forward.  Patient will follow up with our office prn. Routing to provider for final review. Patient agreeable to disposition. Will close encounter

## 2013-11-07 NOTE — Telephone Encounter (Signed)
Advise appt with providers taking over for Dr. Arnoldo Morale - Roslynn Amble or Dr. Yong Channel as next new pt appointment not until 2016.

## 2013-11-08 NOTE — Telephone Encounter (Addendum)
Pt will wait on hunter

## 2013-11-11 ENCOUNTER — Emergency Department (HOSPITAL_COMMUNITY): Payer: Medicare Other

## 2013-11-11 ENCOUNTER — Encounter (HOSPITAL_COMMUNITY): Payer: Self-pay | Admitting: Emergency Medicine

## 2013-11-11 ENCOUNTER — Emergency Department (HOSPITAL_COMMUNITY)
Admission: EM | Admit: 2013-11-11 | Discharge: 2013-11-11 | Disposition: A | Discharge status: Home / Self Care | Payer: Medicare Other | Attending: Emergency Medicine | Admitting: Emergency Medicine

## 2013-11-11 DIAGNOSIS — Z79899 Other long term (current) drug therapy: Secondary | ICD-10-CM | POA: Diagnosis not present

## 2013-11-11 DIAGNOSIS — Z9889 Other specified postprocedural states: Secondary | ICD-10-CM | POA: Insufficient documentation

## 2013-11-11 DIAGNOSIS — Z7982 Long term (current) use of aspirin: Secondary | ICD-10-CM | POA: Insufficient documentation

## 2013-11-11 DIAGNOSIS — Z8669 Personal history of other diseases of the nervous system and sense organs: Secondary | ICD-10-CM | POA: Insufficient documentation

## 2013-11-11 DIAGNOSIS — G43109 Migraine with aura, not intractable, without status migrainosus: Secondary | ICD-10-CM | POA: Insufficient documentation

## 2013-11-11 DIAGNOSIS — Z853 Personal history of malignant neoplasm of breast: Secondary | ICD-10-CM | POA: Insufficient documentation

## 2013-11-11 DIAGNOSIS — H40009 Preglaucoma, unspecified, unspecified eye: Secondary | ICD-10-CM | POA: Insufficient documentation

## 2013-11-11 DIAGNOSIS — Z872 Personal history of diseases of the skin and subcutaneous tissue: Secondary | ICD-10-CM | POA: Insufficient documentation

## 2013-11-11 DIAGNOSIS — M810 Age-related osteoporosis without current pathological fracture: Secondary | ICD-10-CM | POA: Diagnosis not present

## 2013-11-11 DIAGNOSIS — Z8701 Personal history of pneumonia (recurrent): Secondary | ICD-10-CM | POA: Diagnosis not present

## 2013-11-11 DIAGNOSIS — E559 Vitamin D deficiency, unspecified: Secondary | ICD-10-CM | POA: Insufficient documentation

## 2013-11-11 DIAGNOSIS — N35919 Unspecified urethral stricture, male, unspecified site: Secondary | ICD-10-CM | POA: Insufficient documentation

## 2013-11-11 DIAGNOSIS — M129 Arthropathy, unspecified: Secondary | ICD-10-CM | POA: Diagnosis not present

## 2013-11-11 DIAGNOSIS — I10 Essential (primary) hypertension: Secondary | ICD-10-CM | POA: Insufficient documentation

## 2013-11-11 DIAGNOSIS — K589 Irritable bowel syndrome without diarrhea: Secondary | ICD-10-CM | POA: Insufficient documentation

## 2013-11-11 DIAGNOSIS — N201 Calculus of ureter: Secondary | ICD-10-CM | POA: Insufficient documentation

## 2013-11-11 DIAGNOSIS — R109 Unspecified abdominal pain: Secondary | ICD-10-CM | POA: Diagnosis not present

## 2013-11-11 DIAGNOSIS — Z791 Long term (current) use of non-steroidal anti-inflammatories (NSAID): Secondary | ICD-10-CM | POA: Diagnosis not present

## 2013-11-11 DIAGNOSIS — E119 Type 2 diabetes mellitus without complications: Secondary | ICD-10-CM | POA: Insufficient documentation

## 2013-11-11 DIAGNOSIS — Z8709 Personal history of other diseases of the respiratory system: Secondary | ICD-10-CM | POA: Diagnosis not present

## 2013-11-11 DIAGNOSIS — N1330 Unspecified hydronephrosis: Secondary | ICD-10-CM | POA: Insufficient documentation

## 2013-11-11 DIAGNOSIS — N135 Crossing vessel and stricture of ureter without hydronephrosis: Secondary | ICD-10-CM | POA: Diagnosis not present

## 2013-11-11 DIAGNOSIS — Z8742 Personal history of other diseases of the female genital tract: Secondary | ICD-10-CM | POA: Insufficient documentation

## 2013-11-11 HISTORY — DX: Unspecified asthma, uncomplicated: J45.909

## 2013-11-11 LAB — URINE MICROSCOPIC-ADD ON

## 2013-11-11 LAB — COMPREHENSIVE METABOLIC PANEL
ALK PHOS: 81 U/L (ref 39–117)
ALT: 25 U/L (ref 0–35)
AST: 19 U/L (ref 0–37)
Albumin: 3.4 g/dL — ABNORMAL LOW (ref 3.5–5.2)
Anion gap: 14 (ref 5–15)
BUN: 24 mg/dL — ABNORMAL HIGH (ref 6–23)
CHLORIDE: 103 meq/L (ref 96–112)
CO2: 25 meq/L (ref 19–32)
Calcium: 8.9 mg/dL (ref 8.4–10.5)
Creatinine, Ser: 0.78 mg/dL (ref 0.50–1.10)
GFR, EST NON AFRICAN AMERICAN: 81 mL/min — AB (ref >90)
GLUCOSE: 214 mg/dL — AB (ref 70–99)
POTASSIUM: 4.1 meq/L (ref 3.7–5.3)
SODIUM: 142 meq/L (ref 137–147)
Total Bilirubin: 0.3 mg/dL (ref 0.3–1.2)
Total Protein: 6.7 g/dL (ref 6.0–8.3)

## 2013-11-11 LAB — CBC WITH DIFFERENTIAL/PLATELET
BASOS ABS: 0 10*3/uL (ref 0.0–0.1)
Basophils Relative: 0 % (ref 0–1)
Eosinophils Absolute: 0.4 10*3/uL (ref 0.0–0.7)
Eosinophils Relative: 4 % (ref 0–5)
HCT: 38.6 % (ref 36.0–46.0)
Hemoglobin: 12.6 g/dL (ref 12.0–15.0)
LYMPHS ABS: 1.6 10*3/uL (ref 0.7–4.0)
LYMPHS PCT: 16 % (ref 12–46)
MCH: 29.8 pg (ref 26.0–34.0)
MCHC: 32.6 g/dL (ref 30.0–36.0)
MCV: 91.3 fL (ref 78.0–100.0)
Monocytes Absolute: 0.6 10*3/uL (ref 0.1–1.0)
Monocytes Relative: 6 % (ref 3–12)
NEUTROS ABS: 7.7 10*3/uL (ref 1.7–7.7)
NEUTROS PCT: 74 % (ref 43–77)
PLATELETS: 225 10*3/uL (ref 150–400)
RBC: 4.23 MIL/uL (ref 3.87–5.11)
RDW: 13.9 % (ref 11.5–15.5)
WBC: 10.3 10*3/uL (ref 4.0–10.5)

## 2013-11-11 LAB — URINALYSIS, ROUTINE W REFLEX MICROSCOPIC
Bilirubin Urine: NEGATIVE
Glucose, UA: NEGATIVE mg/dL
KETONES UR: 15 mg/dL — AB
LEUKOCYTES UA: NEGATIVE
Nitrite: NEGATIVE
PROTEIN: NEGATIVE mg/dL
Specific Gravity, Urine: >1.046 — ABNORMAL HIGH (ref 1.005–1.030)
Urobilinogen, UA: 0.2 mg/dL (ref 0.0–1.0)
pH: 5 (ref 5.0–8.0)

## 2013-11-11 LAB — LIPASE, BLOOD: Lipase: 32 U/L (ref 11–59)

## 2013-11-11 MED ORDER — KETOROLAC TROMETHAMINE 15 MG/ML IJ SOLN
15.0000 mg | Freq: Once | INTRAMUSCULAR | Status: AC
  Administered 2013-11-11: 15 mg via INTRAVENOUS
  Filled 2013-11-11: qty 1

## 2013-11-11 MED ORDER — ONDANSETRON HCL 4 MG/2ML IJ SOLN
4.0000 mg | Freq: Once | INTRAMUSCULAR | Status: AC
  Administered 2013-11-11: 4 mg via INTRAVENOUS
  Filled 2013-11-11: qty 2

## 2013-11-11 MED ORDER — TAMSULOSIN HCL 0.4 MG PO CAPS: 0.4000 mg | ORAL_CAPSULE | Freq: Every day | ORAL | Status: DC

## 2013-11-11 MED ORDER — MORPHINE SULFATE 4 MG/ML IJ SOLN
4.0000 mg | Freq: Once | INTRAMUSCULAR | Status: AC
  Administered 2013-11-11: 4 mg via INTRAVENOUS
  Filled 2013-11-11: qty 1

## 2013-11-11 MED ORDER — KETOROLAC TROMETHAMINE 30 MG/ML IJ SOLN: 30.0000 mg | Freq: Once | INTRAMUSCULAR | Status: DC

## 2013-11-11 MED ORDER — METOCLOPRAMIDE HCL 5 MG/ML IJ SOLN
10.0000 mg | Freq: Once | INTRAMUSCULAR | Status: AC
  Administered 2013-11-11: 10 mg via INTRAVENOUS
  Filled 2013-11-11: qty 2

## 2013-11-11 MED ORDER — ONDANSETRON HCL 4 MG PO TABS: 4.0000 mg | ORAL_TABLET | Freq: Three times a day (TID) | ORAL | Status: DC | PRN

## 2013-11-11 MED ORDER — SODIUM CHLORIDE 0.9 % IV BOLUS (SEPSIS)
500.0000 mL | Freq: Once | INTRAVENOUS | Status: AC
  Administered 2013-11-11: 500 mL via INTRAVENOUS

## 2013-11-11 MED ORDER — IOHEXOL 300 MG/ML  SOLN
100.0000 mL | Freq: Once | INTRAMUSCULAR | Status: AC | PRN
  Administered 2013-11-11: 100 mL via INTRAVENOUS

## 2013-11-11 MED ORDER — HYDROCODONE-ACETAMINOPHEN 5-325 MG PO TABS: 1.0000 | ORAL_TABLET | ORAL | Status: DC | PRN

## 2013-11-11 MED ORDER — SODIUM CHLORIDE 0.9 % IV SOLN: Freq: Once | INTRAVENOUS | Status: DC

## 2013-11-11 MED ORDER — HYDROMORPHONE HCL PF 1 MG/ML IJ SOLN
0.5000 mg | Freq: Once | INTRAMUSCULAR | Status: AC
  Administered 2013-11-11: 0.5 mg via INTRAVENOUS
  Filled 2013-11-11: qty 1

## 2013-11-11 MED ORDER — IOHEXOL 300 MG/ML  SOLN
25.0000 mL | Freq: Once | INTRAMUSCULAR | Status: AC | PRN
  Administered 2013-11-11: 25 mL via ORAL

## 2013-11-11 NOTE — ED Notes (Signed)
Pt from home with c/o RLQ pain starting at 6 am today radiating to right flank.  Pt has nausea but denies V/D, last BM yesterday.  Pt in NAD, A&O.

## 2013-11-11 NOTE — Discharge Instructions (Signed)
Read the information below.  Use the prescribed medication as directed.  Please discuss all new medications with your pharmacist.  Do not take additional tylenol while taking the prescribed pain medication to avoid overdose.  You may return to the Emergency Department at any time for worsening condition or any new symptoms that concern you.  If you develop high fevers, worsening abdominal pain, uncontrolled vomiting, or are unable to tolerate fluids by mouth, return to the ER for a recheck.      Kidney Stones Kidney stones (urolithiasis) are deposits that form inside your kidneys. The intense pain is caused by the stone moving through the urinary tract. When the stone moves, the ureter goes into spasm around the stone. The stone is usually passed in the urine.  CAUSES   A disorder that makes certain neck glands produce too much parathyroid hormone (primary hyperparathyroidism).  A buildup of uric acid crystals, similar to gout in your joints.  Narrowing (stricture) of the ureter.  A kidney obstruction present at birth (congenital obstruction).  Previous surgery on the kidney or ureters.  Numerous kidney infections. SYMPTOMS   Feeling sick to your stomach (nauseous).  Throwing up (vomiting).  Blood in the urine (hematuria).  Pain that usually spreads (radiates) to the groin.  Frequency or urgency of urination. DIAGNOSIS   Taking a history and physical exam.  Blood or urine tests.  CT scan.  Occasionally, an examination of the inside of the urinary bladder (cystoscopy) is performed. TREATMENT   Observation.  Increasing your fluid intake.  Extracorporeal shock wave lithotripsy--This is a noninvasive procedure that uses shock waves to break up kidney stones.  Surgery may be needed if you have severe pain or persistent obstruction. There are various surgical procedures. Most of the procedures are performed with the use of small instruments. Only small incisions are needed to  accommodate these instruments, so recovery time is minimized. The size, location, and chemical composition are all important variables that will determine the proper choice of action for you. Talk to your health care provider to better understand your situation so that you will minimize the risk of injury to yourself and your kidney.  HOME CARE INSTRUCTIONS   Drink enough water and fluids to keep your urine clear or pale yellow. This will help you to pass the stone or stone fragments.  Strain all urine through the provided strainer. Keep all particulate matter and stones for your health care provider to see. The stone causing the pain may be as small as a grain of salt. It is very important to use the strainer each and every time you pass your urine. The collection of your stone will allow your health care provider to analyze it and verify that a stone has actually passed. The stone analysis will often identify what you can do to reduce the incidence of recurrences.  Only take over-the-counter or prescription medicines for pain, discomfort, or fever as directed by your health care provider.  Make a follow-up appointment with your health care provider as directed.  Get follow-up X-rays if required. The absence of pain does not always mean that the stone has passed. It may have only stopped moving. If the urine remains completely obstructed, it can cause loss of kidney function or even complete destruction of the kidney. It is your responsibility to make sure X-rays and follow-ups are completed. Ultrasounds of the kidney can show blockages and the status of the kidney. Ultrasounds are not associated with any radiation and  can be performed easily in a matter of minutes. SEEK MEDICAL CARE IF:  You experience pain that is progressive and unresponsive to any pain medicine you have been prescribed. SEEK IMMEDIATE MEDICAL CARE IF:   Pain cannot be controlled with the prescribed medicine.  You have a  fever or shaking chills.  The severity or intensity of pain increases over 18 hours and is not relieved by pain medicine.  You develop a new onset of abdominal pain.  You feel faint or pass out.  You are unable to urinate. MAKE SURE YOU:   Understand these instructions.  Will watch your condition.  Will get help right away if you are not doing well or get worse. Document Released: 04/26/2005 Document Revised: 12/27/2012 Document Reviewed: 09/27/2012 [facility] Patient Information 2015 [place], [place]. This information is not intended to replace advice given to you by your health care provider. Make sure you discuss any questions you have with your health care provider.  Hydronephrosis Hydronephrosis is an abnormal enlargement of your kidney. It can affect one or both the kidneys. It results from the backward pressure of urine on the kidneys, when the flow of urine is blocked. Normally, the urine drains from the kidney through the urine tube (ureter), into a sac which holds the urine until urination (bladder). When the urinary flow is blocked, the urine collects above the block. This causes an increase in the pressure inside the kidney, which in turn leads to its enlargement. The block can occur at the point where the kidney joins the ureter. Treatment depends on the cause and location of the block.  CAUSES  The causes of this condition include:  Birth defect of the kidney or ureter.  Kink at the point where the kidney joins the ureter.  Stones and blood clots in the kidney or ureter.  Cancer, injury, or infection of the ureter.  Scar tissue formation.  Backflow of urine (reflux).  Cancer of bladder or prostate gland.  Abnormality of the nerves or muscles of the kidney or ureter.  Lower part of the ureter protruding into the bladder (ureterocele).  Abnormal contractions of the bladder.  Both the kidneys can be affected during pregnancy. This is because the enlarging uterus  presses on the ureters and blocks the flow of urine. SYMPTOMS  The symptoms depend on the location of the block. They also depend on how long the block has been present. You may feel pain on the affected side. Sometimes, you may not have any symptoms. There may be a dull ache or discomfort in the flank. The common symptoms are:  Flank pain.  Swelling of the abdomen.  Pain in the abdomen.  Nausea and vomiting.  Fever.  Pain while passing urine.  Urgency for urination.  Frequent or urgent urination.  Infection of the urinary tract. DIAGNOSIS  Your caregiver will examine you after asking about your symptoms. You may be asked to do blood and urine tests. Your caregiver may order a special X-ray, ultrasound, or CT scan. Sometimes a rigid or flexible telescope (cystoscope) is used to view the site of the blockage.  TREATMENT  Treatment depends on the site, cause, and duration of the block. The goal of treatment is to remove the blockage. Your caregiver will plan the treatment based on your condition. The different types of treatment are:   Putting in a soft plastic tube (ureteral stent) to connect the bladder with the kidney. This will help in draining the urine.  Putting in a  soft tube (nephrostomy tube). This is placed through skin into the kidney. The trapped urine is drained out through the back. A plastic bag is attached to your skin to hold the urine that has drained out.  Antibiotics to treat or prevent infection.  Breaking down of the stone (lithotripsy). HOME CARE INSTRUCTIONS   It may take some time for the hydronephrosis to go away (resolve). Drink fluids as directed by your caregiver , and get a lot of rest.  If you have a drain in, your caregiver will give you directions about how to care for it. Be sure you understand these directions completely before you go home.  Take any antibiotics, pain medications, or other prescriptions exactly as prescribed.  Follow-up with  your caregivers as directed. SEEK MEDICAL CARE IF:   You continue to have flank pain, nausea, or difficulty with urination.  You have any problem with any type of drainage device.  Your urine becomes cloudy or bloody. SEEK IMMEDIATE MEDICAL CARE IF:   You have severe flank and/or abdominal pain.  You develop vomiting and are unable to hold down fluids.  You develop a fever above 100.5 F (38.1 C), or as per your caregiver. MAKE SURE YOU:   Understand these instructions.  Will watch your condition.  Will get help right away if you are not doing well or get worse. Document Released: 02/21/2007 Document Revised: 07/19/2011 Document Reviewed: 04/09/2010 [facility] Patient Information 2015 [place], [place]. This information is not intended to replace advice given to you by your health care provider. Make sure you discuss any questions you have with your health care provider.  Dietary Guidelines to Help Prevent Kidney Stones Your risk of kidney stones can be decreased by adjusting the foods you eat. The most important thing you can do is drink enough fluid. You should drink enough fluid to keep your urine clear or pale yellow. The following guidelines provide specific information for the type of kidney stone you have had. GUIDELINES ACCORDING TO TYPE OF KIDNEY STONE Calcium Oxalate Kidney Stones  Reduce the amount of salt you eat. Foods that have a lot of salt cause your body to release excess calcium into your urine. The excess calcium can combine with a substance called oxalate to form kidney stones.  Reduce the amount of animal protein you eat if the amount you eat is excessive. Animal protein causes your body to release excess calcium into your urine. Ask your dietitian how much protein from animal sources you should be eating.  Avoid foods that are high in oxalates. If you take vitamins, they should have less than 500 mg of vitamin C. Your body turns vitamin C into oxalates. You do not  need to avoid fruits and vegetables high in vitamin C. Calcium Phosphate Kidney Stones  Reduce the amount of salt you eat to help prevent the release of excess calcium into your urine.  Reduce the amount of animal protein you eat if the amount you eat is excessive. Animal protein causes your body to release excess calcium into your urine. Ask your dietitian how much protein from animal sources you should be eating.  Get enough calcium from food or take a calcium supplement (ask your dietitian for recommendations). Food sources of calcium that do not increase your risk of kidney stones include:  Broccoli.  Dairy products, such as cheese and yogurt.  Pudding. Uric Acid Kidney Stones  Do not have more than 6 oz of animal protein per day. FOOD SOURCES Animal  Protein Sources  Meat (all types).  Poultry.  Eggs.  Fish, seafood. Foods High in [employer] seasonings.  Soy sauce.  Teriyaki sauce.  Cured and processed meats.  Salted crackers and snack foods.  Fast food.  Canned soups and most canned foods. Foods High in Oxalates  Grains:  Amaranth.  Barley.  Grits.  Wheat germ.  Bran.  Buckwheat flour.  All bran cereals.  Pretzels.  Whole wheat bread.  Vegetables:  Beans (wax).  Beets and beet greens.  Collard greens.  Eggplant.  [place].  Leeks.  Okra.  Parsley.  Rutabagas.  Spinach.  Swiss chard.  Tomato paste.  Fried potatoes.  Sweet potatoes.  Fruits:  Red currants.  Figs.  Kiwi.  Rhubarb.  Meat and Other Protein Sources:  Beans (dried).  Soy burgers and other soybean products.  Miso.  Nuts (peanuts, almonds, pecans, cashews, hazelnuts).  Nut butters.  Sesame seeds and tahini (paste made of sesame seeds).  Poppy seeds.  Beverages:  Chocolate drink mixes.  Soy milk.  Instant iced tea.  Juices made from high-oxalate fruits or  vegetables.  Other:  Carob.  Chocolate.  Fruitcake.  Marmalades. Document Released: 08/21/2010 Document Revised: 05/01/2013 Document Reviewed: 03/23/2013 [facility] Patient Information 2015 [place], [place]. This information is not intended to replace advice given to you by your health care provider. Make sure you discuss any questions you have with your health care provider.

## 2013-11-11 NOTE — ED Notes (Signed)
Patient transported to X-ray 

## 2013-11-11 NOTE — ED Provider Notes (Signed)
CSN: [id number]     Arrival date & time 11/11/13  0725 History   First MD Initiated Contact with Patient 11/11/13 0730     Chief Complaint  Patient presents with  . Flank Pain  . Abdominal Pain     (Consider location/radiation/quality/duration/timing/severity/associated sxs/prior Treatment) The history is provided by the patient.    Patient reports acute onset right sided abdominal pain that woke her from sleep at 6am.  Associated nausea.  Pain is crampy and constant, worsening, 10/10 intensity.  No exacerbating or palliative factors but feels that she would feel better with BM.  Not passing flatus.  S/P abdominal surgeries: cholecystectomy, bilateral oophorectomy.  Had colonoscopy 3-4 months ago with good results.  No hx kidney stones.  No fevers, no vomiting, no change in bowel habits, no urinary symptoms.  No recent illness.  Yesterday she was feeling completely fine.    Past Medical History  Diagnosis Date  . Hx of breast cancer 08/1992 right    intraductal ca/ w/ microinvasion ER/PR -  . Osteoporosis   . Shingles   . Neuropathy   . AC (acromioclavicular) joint bone spurs   . Diverticulitis   . Heartburn   . Headache(784.0)     Migraines  . Hammer toe   . PONV (postoperative nausea and vomiting)   . Hypertension     takes Atenolol daily  . History of bronchitis   . Pneumonia     history of;many yrs ago  . History of migraine     last one about 6-5months ago;Prodrin prn  . Joint pain   . Arthritis     right knee;injections every 67months   . Scoliosis   . Sensitive skin     sees a dermatoligist yrly  . IBS (irritable bowel syndrome)   . Vitamin D deficiency     takes Vit d daily  . Diabetes mellitus     takes Amaryl and Januvia daily  . Early cataracts, bilateral   . Glaucoma   . Cancer     hx of breast ca  . Endometriosis   . Postmenopausal atrophic vaginitis     uses Estring  . Gastritis    Past Surgical History  Procedure Laterality Date  .  Cholecystectomy  06/2008  . Tonsillectomy    . Thyroidectomy      68yrs ago  . Mastectomy Bilateral 1994 right, 1995 left  . Laparoscopic oophorectomy Right 12/2004    absent LSO  . Laparotomy    . Diagnostic laparoscopy    . Adenoidectomy      at age 61  . Excision removed from neck      infected lymph node  . Colonoscopy  11/02/05    Diverticulosis, recheck 7 years.  Yearly HOC  . Shoulder hemi-arthroplasty  01/01/2012    Procedure: SHOULDER HEMI-ARTHROPLASTY;  Surgeon: Roseanne Kaufman, MD;  Location: [facility];  Service: Orthopedics;  Laterality: Left;  Left Shoulder Hemi Arthroplasty with Repair and Reconstruction as Necessary   . Endometrial biopsy  06/22/2011    benign polyp, no hyperplasia  . Hysteroscopy  06/22/11    PMB submucosal myoma  . Biopsy on back      benign  . Excision on breast      internal infected suture from breast surgery   Family History  Problem Relation Age of Onset  . Arthritis Mother   . COPD Father   . Heart disease Father   . Hypertension Father   . Hyperlipidemia Father   .  Colon cancer Neg Hx   . Esophageal cancer Neg Hx   . Rectal cancer Neg Hx   . Stomach cancer Neg Hx    History  Substance Use Topics  . Smoking status: Never Smoker   . Smokeless tobacco: Never Used  . Alcohol Use: No   OB History   Grav Para Term Preterm Abortions TAB SAB Ect Mult Living   0              Obstetric Comments   1 adopted     Review of Systems  All other systems reviewed and are negative.     Allergies  Cephalexin; Erythromycin ethylsuccinate; Nitrofurantoin; and Percocet  Home Medications   Prior to Admission medications   Medication Sig Start Date End Date Taking? Authorizing Provider  amoxicillin (AMOXIL) 500 MG capsule Take 4 tabs 1/2 to 1 hour prior to dental work 03/07/13   Ricard Dillon, MD  APAP-Isometheptene-Caffeine (PRODRIN) 500-130-20 MG TABS Take 2 tablets by mouth daily as needed. At the onset of migraine    Historical Provider, MD   aspirin 325 MG tablet Take 325 mg by mouth once a week. Monday only    Historical Provider, MD  aspirin 81 MG tablet Take 81 mg by mouth. Daily, except on Mondays    Historical Provider, MD  atenolol (TENORMIN) 25 MG tablet TAKE ONE TABLET BY MOUTH ONCE DAILY 06/05/13   Ricard Dillon, MD  bimatoprost (LUMIGAN) 0.03 % ophthalmic drops Place 1 drop into both eyes at bedtime.      Historical Provider, MD  cholecalciferol (VITAMIN D) 1000 UNITS tablet Take 1,000 Units by mouth daily.    Historical Provider, MD  clobetasol ointment (TEMOVATE) 7.34 % Apply 1 application topically. As needed    Historical Provider, MD  co-enzyme Q-10 50 MG capsule Take 150 mg by mouth daily.    Historical Provider, MD  cyanocobalamin (,VITAMIN B-12,) 1000 MCG/ML injection Inject 1,000 mcg into the muscle every 21 ( twenty-one) days. Last dose 1/25    Historical Provider, MD  dicyclomine (BENTYL) 20 MG tablet Take 1 tablet before lunch daily as needed for diarrhea 06/13/13   Lafayette Dragon, MD  dorzolamide-timolol (COSOPT) 22.3-6.8 MG/ML ophthalmic solution Place into both eyes 2 (two) times daily.  02/08/13   Historical Provider, MD  estradiol (ESTRING) 2 MG vaginal ring Place 2 mg vaginally every 3 (three) months. follow package directions 06/05/13   Lyman Speller, MD  Flaxseed, Linseed, (FLAX SEED OIL) 1000 MG CAPS Take 1 capsule by mouth daily.     Historical Provider, MD  FLUoxetine (PROZAC) 20 MG capsule TAKE ONE CAPSULE BY MOUTH ONCE DAILY 09/17/13   Ricard Dillon, MD  FREESTYLE LITE test strip USE ONE STRIP TO CHECK GLUCOSE EVERY DAY    Ricard Dillon, MD  glimepiride (AMARYL) 4 MG tablet TAKE ONE TABLET BY MOUTH EVERY DAY. 04/04/13   Ricard Dillon, MD  Grape Seed Extract 100 MG CAPS Take 150 mg by mouth daily.     Historical Provider, MD  Lactobacillus (ACIDOPHILUS PROBIOTIC) 100 MG CAPS Take 1 capsule (100 mg total) by mouth daily at 6 (six) AM. 10/30/12   Ricard Dillon, MD  Lancets (FREESTYLE) lancets AS  DIRECTED 05/11/12   Ricard Dillon, MD  meloxicam (MOBIC) 15 MG tablet TAKE ONE TABLET BY MOUTH ONCE DAILY    Ricard Dillon, MD  Methylcellulose, Laxative, (CITRUCEL) 500 MG TABS Take 4 tablets by mouth daily after supper.  Historical Provider, MD  Multiple Vitamins-Minerals (CENTRUM SILVER PO) Take 1 tablet by mouth daily.     Historical Provider, MD  mupirocin ointment (BACTROBAN) 2 % Apply 1 application topically 4 (four) times daily. 09/13/13   Lyman Speller, MD  Omega-3 Fatty Acids (FISH OIL) 1200 MG CAPS Take 1 capsule by mouth 2 (two) times daily.    Historical Provider, MD  pramipexole (MIRAPEX) 0.5 MG tablet TAKE ONE TABLET BY MOUTH AT BEDTIME    Ricard Dillon, MD  Selenium 200 MCG CAPS Take 200 mcg by mouth daily.    Historical Provider, MD  temazepam (RESTORIL) 30 MG capsule TAKE ONE CAPSULE BY MOUTH AT BEDTIME 06/21/13   Ricard Dillon, MD  VICTOZA 18 MG/3ML SOPN INJECT 0.3 MLS (1.8 MG) INTO THE SKIN DAILY 05/22/13   Ricard Dillon, MD  Vitamin Mixture (ESTER-C) 500-60 MG TABS Take 1 tablet by mouth daily.    Historical Provider, MD   BP 162/80  Pulse 89  Temp(Src) 98.3 F (36.8 C) (Oral)  Resp 22  Ht 6' (1.829 m)  Wt 248 lb (112.492 kg)  BMI 33.63 kg/m2  SpO2 94%  LMP 05/10/1992 Physical Exam  Nursing note and vitals reviewed. Constitutional: She appears well-developed and well-nourished. No distress.  HENT:  Head: Normocephalic and atraumatic.  Neck: Neck supple.  Cardiovascular: Normal rate and regular rhythm.   Pulmonary/Chest: Effort normal and breath sounds normal. No respiratory distress. She has no wheezes. She has no rales.  Abdominal: Soft. Bowel sounds are normal. She exhibits distension. There is tenderness. There is no rebound and no guarding.  Diffuse tenderness through right side.   Obese  Neurological: She is alert.  Skin: She is not diaphoretic.    ED Course  Procedures (including critical care time) Labs Review Labs Reviewed  COMPREHENSIVE  METABOLIC PANEL - Abnormal; Notable for the following:    Glucose, Bld 214 (*)    BUN 24 (*)    Albumin 3.4 (*)    GFR calc non Af Amer 81 (*)    All other components within normal limits  URINALYSIS, ROUTINE W REFLEX MICROSCOPIC - Abnormal; Notable for the following:    Specific Gravity, Urine >1.046 (*)    Hgb urine dipstick TRACE (*)    Ketones, ur 15 (*)    All other components within normal limits  URINE MICROSCOPIC-ADD ON - Abnormal; Notable for the following:    Crystals CA OXALATE CRYSTALS (*)    All other components within normal limits  URINE CULTURE  CBC WITH DIFFERENTIAL  LIPASE, BLOOD    Imaging Review Ct Abdomen Pelvis W Contrast  11/11/2013   CLINICAL DATA:  Right lower quadrant abdominal pain. Right flank pain.  EXAM: CT ABDOMEN AND PELVIS WITH CONTRAST  TECHNIQUE: Multidetector CT imaging of the abdomen and pelvis was performed using the standard protocol following bolus administration of intravenous contrast.  CONTRAST:  151mL OMNIPAQUE IOHEXOL 300 MG/ML  SOLN  COMPARISON:  CT abdomen without contrast 08/10/2007  FINDINGS: The lung bases are clear without focal nodule, mass, or airspace disease. Heart size is normal. No significant pleural or pericardial effusion is present.  The liver and spleen are within normal limits. The stomach is normal. There is some inflammation about the second portion of the duodenum which is likely secondary. The pancreas is within normal limits. The common bile duct is dilated measuring 12 mm. The patient is status post cholecystectomy. No obstructing mass lesion or stone is present. The adrenal glands  are normal bilaterally.  Mild to moderate right-sided hydronephrosis is present. There is significant stranding within the retro tear peritoneal space surrounding the right ureter. A punctate obstructing stone is present at the right UPJ. Underlying UPJ stenosis is suspected. The more distal ureter is unremarkable. The left ureter is within normal  limits. The right kidney is positioned somewhat low. Minimal atherosclerotic calcifications are present in the aorta without aneurysm.  Mild diverticular changes are present within the sigmoid colon. The remainder the colon is unremarkable. The appendix is visualized and normal. Small bowel is within normal limits. No significant adenopathy or free fluid is present. The uterus and adnexa are within normal limits for age. A pessary is in place.  A remote L4 compression fracture is noted. Rightward curvature of the lumbar spine is stable. Chronic endplate changes are stable.  IMPRESSION: 1. Obstructing punctate right UPJ stone. Underlying UPJ stenosis is suspected. 2. Congenitally low positioning of the right kidney is stable. 3. Mild dilation of the common bile duct following cholecystectomy. No obstructing lesion is evident. 4. Atherosclerosis. 5. Remote L4 compression fracture. 6. Sigmoid diverticulosis without diverticulitis.   Electronically Signed   By: Lawrence Santiago M.D.   On: 11/11/2013 10:22   Dg Abd Acute W/chest  11/11/2013   CLINICAL DATA:  Abdominal pain  EXAM: ACUTE ABDOMEN SERIES (ABDOMEN 2 VIEW & CHEST 1 VIEW)  COMPARISON:  01/03/2012  FINDINGS: Mild cardiac enlargement is noted. The lungs are clear. Bilateral axillary surgical changes are noted. A left shoulder prosthesis is again noted.  Scattered large and small bowel gas is seen. No obstructive changes are noted. No free air is seen degenerative changes of the lumbar spine are noted.  IMPRESSION: Nonspecific chest and abdomen.   Electronically Signed   By: Inez Catalina M.D.   On: 11/11/2013 09:49     EKG Interpretation None      9:09 AM Dr Reather Converse made aware of the patient.    MDM   Final diagnoses:  Right ureteral stone  Ureteral stricture, right  Hydronephrosis, right    Afebrile nontoxic patient with acute onset right sided abdominal pain, bloating, nausea, not passing flatus.  Prior abdominal surgeries: cholecystectomy,  bilateral oophorectomy.  Found to have right ureteral stone and underlying ureteral stenosis, hydronephrosis.  No infection evident on UA.  Sent for culture.  D/C home with norco, zofran, flomax, urology follow up.  Discussed results, findings, treatment, and follow up  with patient.  Pt given return precautions.  Pt verbalizes understanding and agrees with plan.       [place], PA-C 11/11/13 1303

## 2013-11-11 NOTE — ED Provider Notes (Signed)
Medical screening examination/treatment/procedure(s) were conducted as a shared visit with non-physician practitioner(s) or resident and myself. I personally evaluated the patient during the encounter and agree with the findings and plan unless otherwise indicated.  I have personally reviewed any xrays and/ or EKG's with the provider and I agree with interpretation.  Patient presented with intermittent severe right lower quadrant abdominal pain since this morning, sudden onset. On exam patient has moderate tenderness right lower quadrant and right flank. Mild dry mucous membranes and overall well-appearing. Discussed differential including appendix/colitis versus kidney stone versus other. CT scan ordered showing small kidney stone. Repeat pain medicines and IV fluids urine pending. Plan for outpatient followup with urology.  Right flank pain, right kidney stone    Mariea Clonts, MD 11/11/13 1630

## 2013-11-12 ENCOUNTER — Telehealth: Payer: Self-pay | Admitting: Internal Medicine

## 2013-11-12 LAB — URINE CULTURE
Colony Count: NO GROWTH
Culture: NO GROWTH

## 2013-11-12 NOTE — Telephone Encounter (Signed)
yes

## 2013-11-12 NOTE — Telephone Encounter (Signed)
Pt went to ED 7/5 and has kidney stone in urethra and there is a narrowing that will not allow stone to pass. They gave pt 20 pain pills to get her through.  However, pt will be out in a day. Pt states she is in a lot of pain.  Pt recieved referral for [facility] Money but will need pain med. No appt. Is it ok to work pt in tomorrow am. Or sooner? pls advise.

## 2013-11-13 ENCOUNTER — Encounter: Payer: Self-pay | Admitting: Family

## 2013-11-13 ENCOUNTER — Ambulatory Visit (INDEPENDENT_AMBULATORY_CARE_PROVIDER_SITE_OTHER): Payer: Medicare Other | Admitting: Family

## 2013-11-13 VITALS — BP 146/80 | HR 91 | Temp 99.4°F | Ht 72.0 in | Wt 261.0 lb

## 2013-11-13 DIAGNOSIS — F3289 Other specified depressive episodes: Secondary | ICD-10-CM

## 2013-11-13 DIAGNOSIS — F32A Depression, unspecified: Secondary | ICD-10-CM

## 2013-11-13 DIAGNOSIS — F329 Major depressive disorder, single episode, unspecified: Secondary | ICD-10-CM

## 2013-11-13 DIAGNOSIS — I6529 Occlusion and stenosis of unspecified carotid artery: Secondary | ICD-10-CM

## 2013-11-13 DIAGNOSIS — N200 Calculus of kidney: Secondary | ICD-10-CM

## 2013-11-13 MED ORDER — FLUOXETINE HCL 40 MG PO CAPS: 40.0000 mg | ORAL_CAPSULE | Freq: Every day | ORAL | Status: DC

## 2013-11-13 MED ORDER — HYDROCODONE-ACETAMINOPHEN 5-325 MG PO TABS: 1.0000 | ORAL_TABLET | Freq: Three times a day (TID) | ORAL | Status: DC | PRN

## 2013-11-13 NOTE — Progress Notes (Signed)
Pre visit review using our clinic review tool, if applicable. No additional management support is needed unless otherwise documented below in the visit note. 

## 2013-11-13 NOTE — Telephone Encounter (Signed)
appt scheduled

## 2013-11-13 NOTE — Patient Instructions (Signed)
Follow up with [name] for Psychology Follow up with Urology for Kidney stones Depression, Adult Depression refers to feeling sad, low, down in the dumps, blue, gloomy, or empty. In general, there are two kinds of depression: 1. Depression that we all experience from time to time because of upsetting life experiences, including the loss of a job or the ending of a relationship (normal sadness or normal grief). This kind of depression is considered normal, is short lived, and resolves within a few days to 2 weeks. (Depression experienced after the loss of a loved one is called bereavement. Bereavement often lasts longer than 2 weeks but normally gets better with time.) 2. Clinical depression, which lasts longer than normal sadness or normal grief or interferes with your ability to function at home, at work, and in school. It also interferes with your personal relationships. It affects almost every aspect of your life. Clinical depression is an illness. Symptoms of depression also can be caused by conditions other than normal sadness and grief or clinical depression. Examples of these conditions are listed as follows:  Physical illness--Some physical illnesses, including underactive thyroid gland (hypothyroidism), severe anemia, specific types of cancer, diabetes, uncontrolled seizures, heart and lung problems, strokes, and chronic pain are commonly associated with symptoms of depression.  Side effects of some prescription medicine--In some people, certain types of prescription medicine can cause symptoms of depression.  Substance abuse--Abuse of alcohol and illicit drugs can cause symptoms of depression. SYMPTOMS Symptoms of normal sadness and normal grief include the following:  Feeling sad or crying for short periods of time.  Not caring about anything (apathy).  Difficulty sleeping or sleeping too much.  No longer able to enjoy the things you used to enjoy.  Desire to be by oneself all  the time (social isolation).  Lack of energy or motivation.  Difficulty concentrating or remembering.  Change in appetite or weight.  Restlessness or agitation. Symptoms of clinical depression include the same symptoms of normal sadness or normal grief and also the following symptoms:  Feeling sad or crying all the time.  Feelings of guilt or worthlessness.  Feelings of hopelessness or helplessness.  Thoughts of suicide or the desire to harm yourself (suicidal ideation).  Loss of touch with reality (psychotic symptoms). Seeing or hearing things that are not real (hallucinations) or having false beliefs about your life or the people around you (delusions and paranoia). DIAGNOSIS  The diagnosis of clinical depression usually is based on the severity and duration of the symptoms. Your caregiver also will ask you questions about your medical history and substance use to find out if physical illness, use of prescription medicine, or substance abuse is causing your depression. Your caregiver also may order blood tests. TREATMENT  Typically, normal sadness and normal grief do not require treatment. However, sometimes antidepressant medicine is prescribed for bereavement to ease the depressive symptoms until they resolve. The treatment for clinical depression depends on the severity of your symptoms but typically includes antidepressant medicine, counseling with a mental health professional, or a combination of both. Your caregiver will help to determine what treatment is best for you. Depression caused by physical illness usually goes away with appropriate medical treatment of the illness. If prescription medicine is causing depression, talk with your caregiver about stopping the medicine, decreasing the dose, or substituting another medicine. Depression caused by abuse of alcohol or illicit drugs abuse goes away with abstinence from these substances. Some adults need professional help in order to  stop drinking or using drugs. SEEK IMMEDIATE CARE IF:  You have thoughts about hurting yourself or others.  You lose touch with reality (have psychotic symptoms).  You are taking medicine for depression and have a serious side effect. FOR MORE INFORMATION [employer] Mental [employer]ness: www.nami.[employer]: https://carter.com/ Document Released: 04/23/2000 Document Revised: 10/26/2011 Document Reviewed: 07/26/2011 [facility] Patient Information 2015 [place], [place]. This information is not intended to replace advice given to you by your health care provider. Make sure you discuss any questions you have with your health care provider.

## 2013-11-13 NOTE — Progress Notes (Signed)
Subjective:    Patient ID: [name], female    DOB: [birth year], 73 y.o.   MRN: [id number]  HPI 73 y.o. White female presents for hospital follow up. Pt was seen on July 5th for kidney stones, she was xrayed and showed the stone is currently in the urethra. Pt was given flomax and Norco for pain control and sent home. Pt states she had significant pain the day after discharge and was taking the Vicodin 2 pills Q4 hours, however she had a large urine void at 1 am and has only needed the pain medicine once since. Pt currently denies pain. Pt primary complaint is "depression and fatigue". Pt states she has had depression a long time but currently it is very bad. She states she is not suicidal but does not know why she needs to keep living. She acknowledges fatigue. Denies fever, chills, SOB and change in appetite.      Review of Systems  Constitutional: Positive for fatigue.  HENT: Negative.   Eyes: Negative.   Respiratory: Negative.   Cardiovascular: Negative.   Gastrointestinal: Negative.   Endocrine: Negative.   Genitourinary: Negative.   Musculoskeletal: Negative.   Skin: Negative.   Allergic/Immunologic: Negative.   Neurological: Negative.   Hematological: Negative.   Psychiatric/Behavioral:       Acknowledges depression.    Past Medical History  Diagnosis Date  . Hx of breast cancer 08/1992 right    intraductal ca/ w/ microinvasion ER/PR -  . Osteoporosis   . Shingles   . Neuropathy   . AC (acromioclavicular) joint bone spurs   . Diverticulitis   . Heartburn   . Headache(784.0)     Migraines  . Hammer toe   . PONV (postoperative nausea and vomiting)   . Hypertension     takes Atenolol daily  . History of bronchitis   . Pneumonia     history of;many yrs ago  . History of migraine     last one about 6-51months ago;Prodrin prn  . Joint pain   . Arthritis     right knee;injections every 31months   . Scoliosis   . Sensitive skin     sees a dermatoligist yrly  .  IBS (irritable bowel syndrome)   . Vitamin D deficiency     takes Vit d daily  . Diabetes mellitus     takes Amaryl and Januvia daily  . Early cataracts, bilateral   . Glaucoma   . Cancer     hx of breast ca  . Endometriosis   . Postmenopausal atrophic vaginitis     uses Estring  . Gastritis   . Asthma     History   Social History  . Marital Status: Married    Spouse Name: N/A    Number of Children: N/A  . Years of Education: N/A   Occupational History  . Not on file.   Social History Main Topics  . Smoking status: Never Smoker   . Smokeless tobacco: Never Used  . Alcohol Use: No  . Drug Use: No  . Sexual Activity: No   Other Topics Concern  . Not on file   Social History Narrative  . No narrative on file    Past Surgical History  Procedure Laterality Date  . Cholecystectomy  06/2008  . Tonsillectomy    . Thyroidectomy      58yrs ago  . Mastectomy Bilateral 1994 right, 1995 left  . Laparoscopic oophorectomy Right 12/2004    absent  LSO  . Laparotomy    . Diagnostic laparoscopy    . Adenoidectomy      at age 49  . Excision removed from neck      infected lymph node  . Colonoscopy  11/02/05    Diverticulosis, recheck 7 years.  Yearly HOC  . Shoulder hemi-arthroplasty  01/01/2012    Procedure: SHOULDER HEMI-ARTHROPLASTY;  Surgeon: Roseanne Kaufman, MD;  Location: [facility];  Service: Orthopedics;  Laterality: Left;  Left Shoulder Hemi Arthroplasty with Repair and Reconstruction as Necessary   . Endometrial biopsy  06/22/2011    benign polyp, no hyperplasia  . Hysteroscopy  06/22/11    PMB submucosal myoma  . Biopsy on back      benign  . Excision on breast      internal infected suture from breast surgery    Family History  Problem Relation Age of Onset  . Arthritis Mother   . COPD Father   . Heart disease Father   . Hypertension Father   . Hyperlipidemia Father   . Colon cancer Neg Hx   . Esophageal cancer Neg Hx   . Rectal cancer Neg Hx   . Stomach  cancer Neg Hx     Allergies  Allergen Reactions  . Cephalexin Diarrhea    Patient can't remember reaction (per chart at [facility] states diarrhea)  . Erythromycin Ethylsuccinate Hives and Diarrhea  . Nitrofurantoin Other (See Comments)    Severe headache  . Percocet [Oxycodone-Acetaminophen] Itching    Current Outpatient Prescriptions on File Prior to Visit  Medication Sig Dispense Refill  . aspirin 325 MG tablet Take 325 mg by mouth once a week. Monday only      . aspirin 81 MG tablet Take 81 mg by mouth. Daily, except on Mondays      . atenolol (TENORMIN) 25 MG tablet Take 25 mg by mouth daily.      . bimatoprost (LUMIGAN) 0.03 % ophthalmic drops Place 1 drop into both eyes at bedtime.        . cholecalciferol (VITAMIN D) 1000 UNITS tablet Take 1,000 Units by mouth daily.      [name] co-enzyme Q-10 50 MG capsule Take 150 mg by mouth daily.      . cyanocobalamin (,VITAMIN B-12,) 1000 MCG/ML injection Inject 1,000 mcg into the muscle every 21 ( twenty-one) days. Last dose 1/25      . dorzolamide-timolol (COSOPT) 22.3-6.8 MG/ML ophthalmic solution Place into both eyes 2 (two) times daily.       [name] estradiol (ESTRING) 2 MG vaginal ring Place 2 mg vaginally every 3 (three) months. follow package directions  1 each  4  . Flaxseed, Linseed, (FLAX SEED OIL) 1000 MG CAPS Take 1 capsule by mouth daily.       [name] FREESTYLE LITE test strip USE ONE STRIP TO CHECK GLUCOSE EVERY DAY  100 each  0  . glimepiride (AMARYL) 4 MG tablet Take 4 mg by mouth daily with breakfast.      . Grape Seed Extract 100 MG CAPS Take 150 mg by mouth daily.       . Lactobacillus (ACIDOPHILUS PROBIOTIC) 100 MG CAPS Take 1 capsule (100 mg total) by mouth daily at 6 (six) AM.  30 capsule  11  . Lancets (FREESTYLE) lancets AS DIRECTED  100 each  0  . Liraglutide (VICTOZA) 18 MG/3ML SOPN Inject 1.8 mg into the skin daily.      . meloxicam (MOBIC) 15 MG tablet Take 15 mg by  mouth daily.      . Methylcellulose, Laxative, (CITRUCEL) 500 MG  TABS Take 2 tablets by mouth daily after supper.       . Multiple Vitamins-Minerals (CENTRUM SILVER PO) Take 1 tablet by mouth daily.       . Omega-3 Fatty Acids (FISH OIL) 1200 MG CAPS Take 1 capsule by mouth 2 (two) times daily.      . ondansetron (ZOFRAN) 4 MG tablet Take 1 tablet (4 mg total) by mouth every 8 (eight) hours as needed for nausea or vomiting.  20 tablet  0  . pramipexole (MIRAPEX) 0.5 MG tablet Take 0.5 mg by mouth at bedtime.      . Selenium 200 MCG CAPS Take 200 mcg by mouth daily.      . tamsulosin (FLOMAX) 0.4 MG CAPS capsule Take 1 capsule (0.4 mg total) by mouth daily.  30 capsule  0  . temazepam (RESTORIL) 30 MG capsule Take 30 mg by mouth at bedtime as needed for sleep.      . Vitamin Mixture (ESTER-C) 500-60 MG TABS Take 1 tablet by mouth daily.       No current facility-administered medications on file prior to visit.    BP 146/80  Pulse 91  Temp(Src) 99.4 F (37.4 C) (Oral)  Ht 6' (1.829 m)  Wt 261 lb (118.389 kg)  BMI 35.39 kg/m2  LMP 01/01/1994chart    Objective:   Physical Exam  Constitutional: She is oriented to person, place, and time. She appears well-developed and well-nourished. She is active.  Cardiovascular: Normal rate, regular rhythm, normal heart sounds and normal pulses.   Pulmonary/Chest: Effort normal and breath sounds normal.  Abdominal: Soft. Normal appearance and bowel sounds are normal.  Neurological: She is alert and oriented to person, place, and time.  Skin: Skin is warm, dry and intact.  Psychiatric: Her speech is normal. Judgment and thought content normal. She is slowed and withdrawn. Cognition and memory are normal. She exhibits a depressed mood.          Assessment & Plan:  [name] was seen today for no specified reason.  Diagnoses and associated orders for this visit:  Depression  Kidney stone  Other Orders - Discontinue: FLUoxetine (PROZAC) 40 MG capsule; Take 1 capsule (40 mg total) by mouth  daily. - FLUoxetine (PROZAC) 40 MG capsule; Take 1 capsule (40 mg total) by mouth daily. - HYDROcodone-acetaminophen (NORCO/VICODIN) 5-325 MG per tablet; Take 1 tablet by mouth every 8 (eight) hours as needed for severe pain.   Educaiton: Drink plenty of water to stay hydrated. Take pain medication for severe pain. Follow up with Richardo Priest for psychology and follow up with Urology.   Follow up: 3 weeks for depression recheck.

## 2013-11-14 ENCOUNTER — Ambulatory Visit: Payer: Medicare Other | Admitting: Family

## 2013-11-20 ENCOUNTER — Ambulatory Visit: Payer: Medicare Other | Admitting: Licensed Clinical Social Worker

## 2013-11-20 DIAGNOSIS — M7120 Synovial cyst of popliteal space [Baker], unspecified knee: Secondary | ICD-10-CM | POA: Diagnosis not present

## 2013-11-22 ENCOUNTER — Telehealth: Payer: Self-pay | Admitting: *Deleted

## 2013-11-22 NOTE — Telephone Encounter (Signed)
Follow up call to patient, see previous phone message. Checking to see if vulvar area has healed and if not schedule OV with Dr Sabra Heck. LMTCB.

## 2013-11-26 ENCOUNTER — Other Ambulatory Visit: Payer: Self-pay | Admitting: Internal Medicine

## 2013-11-26 ENCOUNTER — Ambulatory Visit: Payer: Medicare Other | Admitting: Family

## 2013-11-27 NOTE — Telephone Encounter (Signed)
See previous phone messages with instruction from Dr Sabra Heck to check on healing of vulvar lesion.  Return call to patient, She states area is fully healed. Instructed to call if reoccurs. Patient agreeable.  Routing to provider for final review. Patient agreeable to disposition. Will close encounter  Routed to Dr Quincy Simmonds while Dr Sabra Heck out of office.

## 2013-11-27 NOTE — Telephone Encounter (Signed)
Patient is calling [name] back

## 2013-12-04 ENCOUNTER — Encounter: Payer: Self-pay | Admitting: Family

## 2013-12-04 ENCOUNTER — Ambulatory Visit (INDEPENDENT_AMBULATORY_CARE_PROVIDER_SITE_OTHER): Payer: Medicare Other | Admitting: Family

## 2013-12-04 VITALS — BP 130/84 | HR 79 | Ht 72.0 in | Wt 247.0 lb

## 2013-12-04 DIAGNOSIS — F3289 Other specified depressive episodes: Secondary | ICD-10-CM | POA: Diagnosis not present

## 2013-12-04 DIAGNOSIS — IMO0002 Reserved for concepts with insufficient information to code with codable children: Secondary | ICD-10-CM

## 2013-12-04 DIAGNOSIS — S90422A Blister (nonthermal), left great toe, initial encounter: Secondary | ICD-10-CM

## 2013-12-04 DIAGNOSIS — F329 Major depressive disorder, single episode, unspecified: Secondary | ICD-10-CM | POA: Diagnosis not present

## 2013-12-04 DIAGNOSIS — I6529 Occlusion and stenosis of unspecified carotid artery: Secondary | ICD-10-CM | POA: Diagnosis not present

## 2013-12-04 DIAGNOSIS — E119 Type 2 diabetes mellitus without complications: Secondary | ICD-10-CM

## 2013-12-04 DIAGNOSIS — F32A Depression, unspecified: Secondary | ICD-10-CM

## 2013-12-04 MED ORDER — MUPIROCIN 2 % EX OINT: 1.0000 "application " | TOPICAL_OINTMENT | Freq: Two times a day (BID) | CUTANEOUS | Status: DC

## 2013-12-04 NOTE — Progress Notes (Signed)
Subjective:    Patient ID: [name], female    DOB: 06/15/1940, 73 y.o.   MRN: [id number]  HPI 73 year old white female, nonsmoker with a history of type 2 diabetes, depression is in today for recheck of depression. At last office visit we increased Prozac to 20 mg once daily for which she is tolerating well. Reports feeling much better. She was advised to seek therapy she has not pursued. Denies any feelings of helplessness, hopelessness, thoughts of death or dying.  Has concerns of an ulcer that she developed the left great toe 2 weeks ago. Has been applying Bactroban ointment twice a day and it appears to be healing well. Denies any drainage or discharge.     Review of Systems  Constitutional: Negative.   HENT: Negative.   Respiratory: Negative.   Cardiovascular: Negative.   Gastrointestinal: Negative.   Endocrine: Negative for polydipsia and polyphagia.  Genitourinary: Negative.   Musculoskeletal: Negative.   Skin: Positive for wound.       Left great toe healing ulcer   Allergic/Immunologic: Negative.   Psychiatric/Behavioral: Negative.    Past Medical History  Diagnosis Date  . Hx of breast cancer 08/1992 right    intraductal ca/ w/ microinvasion ER/PR -  . Osteoporosis   . Shingles   . Neuropathy   . AC (acromioclavicular) joint bone spurs   . Diverticulitis   . Heartburn   . Headache(784.0)     Migraines  . Hammer toe   . PONV (postoperative nausea and vomiting)   . Hypertension     takes Atenolol daily  . History of bronchitis   . Pneumonia     history of;many yrs ago  . History of migraine     last one about 6-21months ago;Prodrin prn  . Joint pain   . Arthritis     right knee;injections every 58months   . Scoliosis   . Sensitive skin     sees a dermatoligist yrly  . IBS (irritable bowel syndrome)   . Vitamin D deficiency     takes Vit d daily  . Diabetes mellitus     takes Amaryl and Januvia daily  . Early cataracts, bilateral   . Glaucoma     . Cancer     hx of breast ca  . Endometriosis   . Postmenopausal atrophic vaginitis     uses Estring  . Gastritis   . Asthma     History   Social History  . Marital Status: Married    Spouse Name: N/A    Number of Children: N/A  . Years of Education: N/A   Occupational History  . Not on file.   Social History Main Topics  . Smoking status: Never Smoker   . Smokeless tobacco: Never Used  . Alcohol Use: No  . Drug Use: No  . Sexual Activity: No   Other Topics Concern  . Not on file   Social History Narrative  . No narrative on file    Past Surgical History  Procedure Laterality Date  . Cholecystectomy  06/2008  . Tonsillectomy    . Thyroidectomy      35yrs ago  . Mastectomy Bilateral 1994 right, 1995 left  . Laparoscopic oophorectomy Right 12/2004    absent LSO  . Laparotomy    . Diagnostic laparoscopy    . Adenoidectomy      at age 52  . Excision removed from neck      infected lymph node  .  Colonoscopy  11/02/05    Diverticulosis, recheck 7 years.  Yearly HOC  . Shoulder hemi-arthroplasty  01/01/2012    Procedure: SHOULDER HEMI-ARTHROPLASTY;  Surgeon: Roseanne Kaufman, MD;  Location: [facility];  Service: Orthopedics;  Laterality: Left;  Left Shoulder Hemi Arthroplasty with Repair and Reconstruction as Necessary   . Endometrial biopsy  06/22/2011    benign polyp, no hyperplasia  . Hysteroscopy  06/22/11    PMB submucosal myoma  . Biopsy on back      benign  . Excision on breast      internal infected suture from breast surgery    Family History  Problem Relation Age of Onset  . Arthritis Mother   . COPD Father   . Heart disease Father   . Hypertension Father   . Hyperlipidemia Father   . Colon cancer Neg Hx   . Esophageal cancer Neg Hx   . Rectal cancer Neg Hx   . Stomach cancer Neg Hx     Allergies  Allergen Reactions  . Cephalexin Diarrhea    Patient can't remember reaction (per chart at [facility] states diarrhea)  . Erythromycin Ethylsuccinate Hives  and Diarrhea  . Nitrofurantoin Other (See Comments)    Severe headache  . Percocet [Oxycodone-Acetaminophen] Itching    Current Outpatient Prescriptions on File Prior to Visit  Medication Sig Dispense Refill  . aspirin 325 MG tablet Take 325 mg by mouth once a week. Monday only      . aspirin 81 MG tablet Take 81 mg by mouth. Daily, except on Mondays      . atenolol (TENORMIN) 25 MG tablet Take 25 mg by mouth daily.      . bimatoprost (LUMIGAN) 0.03 % ophthalmic drops Place 1 drop into both eyes at bedtime.        . cholecalciferol (VITAMIN D) 1000 UNITS tablet Take 1,000 Units by mouth daily.      [name] co-enzyme Q-10 50 MG capsule Take 150 mg by mouth daily.      . cyanocobalamin (,VITAMIN B-12,) 1000 MCG/ML injection Inject 1,000 mcg into the muscle every 21 ( twenty-one) days. Last dose 1/25      . dorzolamide-timolol (COSOPT) 22.3-6.8 MG/ML ophthalmic solution Place into both eyes 2 (two) times daily.       [name] estradiol (ESTRING) 2 MG vaginal ring Place 2 mg vaginally every 3 (three) months. follow package directions  1 each  4  . Flaxseed, Linseed, (FLAX SEED OIL) 1000 MG CAPS Take 1 capsule by mouth daily.       [name] FLUoxetine (PROZAC) 40 MG capsule Take 1 capsule (40 mg total) by mouth daily.  30 capsule  0  . FREESTYLE LITE test strip USE ONE STRIP TO CHECK GLUCOSE EVERY DAY  100 each  0  . glimepiride (AMARYL) 4 MG tablet Take 4 mg by mouth daily with breakfast.      . Grape Seed Extract 100 MG CAPS Take 150 mg by mouth daily.       [name] HYDROcodone-acetaminophen (NORCO/VICODIN) 5-325 MG per tablet Take 1 tablet by mouth every 8 (eight) hours as needed for severe pain.  12 tablet  0  . Lactobacillus (ACIDOPHILUS PROBIOTIC) 100 MG CAPS Take 1 capsule (100 mg total) by mouth daily at 6 (six) AM.  30 capsule  11  . Lancets (FREESTYLE) lancets AS DIRECTED  100 each  0  . Liraglutide (VICTOZA) 18 MG/3ML SOPN Inject 1.8 mg into the skin daily.      [name]  meloxicam (MOBIC) 15 MG tablet Take 15 mg by  mouth daily.      . meloxicam (MOBIC) 15 MG tablet TAKE ONE TABLET BY MOUTH ONCE DAILY  90 tablet  0  . Methylcellulose, Laxative, (CITRUCEL) 500 MG TABS Take 2 tablets by mouth daily after supper.       . Multiple Vitamins-Minerals (CENTRUM SILVER PO) Take 1 tablet by mouth daily.       . Omega-3 Fatty Acids (FISH OIL) 1200 MG CAPS Take 1 capsule by mouth 2 (two) times daily.      . ondansetron (ZOFRAN) 4 MG tablet Take 1 tablet (4 mg total) by mouth every 8 (eight) hours as needed for nausea or vomiting.  20 tablet  0  . pramipexole (MIRAPEX) 0.5 MG tablet Take 0.5 mg by mouth at bedtime.      . Selenium 200 MCG CAPS Take 200 mcg by mouth daily.      . tamsulosin (FLOMAX) 0.4 MG CAPS capsule Take 1 capsule (0.4 mg total) by mouth daily.  30 capsule  0  . temazepam (RESTORIL) 30 MG capsule Take 30 mg by mouth at bedtime as needed for sleep.      . Vitamin Mixture (ESTER-C) 500-60 MG TABS Take 1 tablet by mouth daily.       No current facility-administered medications on file prior to visit.    BP 130/84  Pulse 79  Ht 6' (1.829 m)  Wt 247 lb (112.038 kg)  BMI 33.49 kg/m2  LMP 01/01/1994chart     Objective:   Physical Exam  Constitutional: She is oriented to person, place, and time. She appears well-developed and well-nourished.  Neck: Normal range of motion. Neck supple.  Cardiovascular: Normal rate, regular rhythm and normal heart sounds.   Pulmonary/Chest: Effort normal and breath sounds normal.  Musculoskeletal: Normal range of motion.  Neurological: She is alert and oriented to person, place, and time.  Skin: Skin is warm and dry.  Left great toe: no drainage or discharge. No erythema. Dime sized healing ulceration noted.   Psychiatric: She has a normal mood and affect.          Assessment & Plan:  See  Dr. Yong Channel to get established. Continue Bactroban as needed twice a day to the left great toe. Warned against signs and symptoms of infection. Call with any concerns.  Continue Prozac at 20 mg.

## 2013-12-04 NOTE — Progress Notes (Signed)
Pre visit review using our clinic review tool, if applicable. No additional management support is needed unless otherwise documented below in the visit note. 

## 2013-12-04 NOTE — Patient Instructions (Signed)
Generalized Anxiety Disorder Generalized anxiety disorder (GAD) is a mental disorder. It interferes with life functions, including relationships, work, and school. GAD is different from normal anxiety, which everyone experiences at some point in their lives in response to specific life events and activities. Normal anxiety actually helps us prepare for and get through these life events and activities. Normal anxiety goes away after the event or activity is over.  GAD causes anxiety that is not necessarily related to specific events or activities. It also causes excess anxiety in proportion to specific events or activities. The anxiety associated with GAD is also difficult to control. GAD can vary from mild to severe. People with severe GAD can have intense waves of anxiety with physical symptoms (panic attacks).  SYMPTOMS The anxiety and worry associated with GAD are difficult to control. This anxiety and worry are related to many life events and activities and also occur more days than not for 6 months or longer. People with GAD also have three or more of the following symptoms (one or more in children):  Restlessness.   Fatigue.  Difficulty concentrating.   Irritability.  Muscle tension.  Difficulty sleeping or unsatisfying sleep. DIAGNOSIS GAD is diagnosed through an assessment by your health care provider. Your health care provider will ask you questions aboutyour mood,physical symptoms, and events in your life. Your health care provider may ask you about your medical history and use of alcohol or drugs, including prescription medicines. Your health care provider may also do a physical exam and blood tests. Certain medical conditions and the use of certain substances can cause symptoms similar to those associated with GAD. Your health care provider may refer you to a mental health specialist for further evaluation. TREATMENT The following therapies are usually used to treat GAD:    Medication. Antidepressant medication usually is prescribed for long-term daily control. Antianxiety medicines may be added in severe cases, especially when panic attacks occur.   Talk therapy (psychotherapy). Certain types of talk therapy can be helpful in treating GAD by providing support, education, and guidance. A form of talk therapy called cognitive behavioral therapy can teach you healthy ways to think about and react to daily life events and activities.  Stress managementtechniques. These include yoga, meditation, and exercise and can be very helpful when they are practiced regularly. A mental health specialist can help determine which treatment is best for you. Some people see improvement with one therapy. However, other people require a combination of therapies. Document Released: 08/21/2012 Document Revised: 09/10/2013 Document Reviewed: 08/21/2012 Exit[employer] Patient Information 2015 [employer]. This information is not intended to replace advice given to you by your health care provider. Make sure you discuss any questions you have with your health care provider.  

## 2013-12-11 DIAGNOSIS — I739 Peripheral vascular disease, unspecified: Secondary | ICD-10-CM | POA: Diagnosis not present

## 2013-12-11 DIAGNOSIS — L84 Corns and callosities: Secondary | ICD-10-CM | POA: Diagnosis not present

## 2013-12-11 DIAGNOSIS — L608 Other nail disorders: Secondary | ICD-10-CM | POA: Diagnosis not present

## 2013-12-11 DIAGNOSIS — E1159 Type 2 diabetes mellitus with other circulatory complications: Secondary | ICD-10-CM | POA: Diagnosis not present

## 2013-12-13 DIAGNOSIS — D518 Other vitamin B12 deficiency anemias: Secondary | ICD-10-CM | POA: Diagnosis not present

## 2013-12-14 ENCOUNTER — Ambulatory Visit (INDEPENDENT_AMBULATORY_CARE_PROVIDER_SITE_OTHER): Payer: Medicare Other | Admitting: Obstetrics & Gynecology

## 2013-12-14 ENCOUNTER — Ambulatory Visit: Payer: Medicare Other | Admitting: Internal Medicine

## 2013-12-14 VITALS — BP 142/58 | HR 72 | Resp 16 | Ht 71.5 in | Wt 247.0 lb

## 2013-12-14 DIAGNOSIS — L98491 Non-pressure chronic ulcer of skin of other sites limited to breakdown of skin: Secondary | ICD-10-CM

## 2013-12-14 DIAGNOSIS — N952 Postmenopausal atrophic vaginitis: Secondary | ICD-10-CM | POA: Diagnosis not present

## 2013-12-14 DIAGNOSIS — L98499 Non-pressure chronic ulcer of skin of other sites with unspecified severity: Secondary | ICD-10-CM | POA: Diagnosis not present

## 2013-12-14 DIAGNOSIS — I6529 Occlusion and stenosis of unspecified carotid artery: Secondary | ICD-10-CM | POA: Diagnosis not present

## 2013-12-14 NOTE — Progress Notes (Signed)
Subjective:     Patient ID: [name], female   DOB: 1940-10-25, 73 y.o.   MRN: [id number]  HPI 73 yo here for estring removal and recheck of ulcerated buttocks lesion.  Pt reports when she uses the Clobetasol ointment, the lesion on the buttocks will completely resolve.  She is not faithful with using it.  Area was biopsied on 09/13/13 showing ulcerations and necrosis consistent with trauma.  She and I have discussed the products she uses but today she admits to using a feminine wipe after BMs and this seems to irritate the area.  Wash clothes or sensitive baby wipes wihtout fragrance suggested.  She will try this.  If area does not improve, will re biopsy at next follow up.  Estring change is due today as pt cannot remove estring on her own.  Paying $200/ring.  D/w decreasing frequency of change from every 3 to every 4 months.  She is in agreement.  Review of Systems  All other systems reviewed and are negative.      Objective:   Physical Exam  Constitutional: She appears well-developed and well-nourished.  Genitourinary: Vagina normal.    There is no rash, tenderness, lesion or injury on the right labia. There is no rash, tenderness, lesion or injury on the left labia. No erythema, tenderness or bleeding around the vagina. No signs of injury around the vagina. No vaginal discharge found.  estring easily removed and new one placed.  Lymphadenopathy:       Right: No inguinal adenopathy present.       Left: No inguinal adenopathy present.       Assessment:     Vaginal atrophic changes with Estring use Skin lesion on buttocks, biopsies 5/15     Plan:     Change estring in 4 months.  If no change to skin at that time, repeat biopsy.  No rx needed right now.

## 2013-12-17 ENCOUNTER — Encounter: Payer: Self-pay | Admitting: Obstetrics & Gynecology

## 2013-12-19 ENCOUNTER — Other Ambulatory Visit: Payer: Self-pay | Admitting: Internal Medicine

## 2014-01-03 ENCOUNTER — Ambulatory Visit (INDEPENDENT_AMBULATORY_CARE_PROVIDER_SITE_OTHER): Payer: Medicare Other | Admitting: Family Medicine

## 2014-01-03 ENCOUNTER — Encounter: Payer: Self-pay | Admitting: Family Medicine

## 2014-01-03 VITALS — BP 148/72 | HR 96 | Temp 98.8°F | Wt 249.0 lb

## 2014-01-03 DIAGNOSIS — F3341 Major depressive disorder, recurrent, in partial remission: Secondary | ICD-10-CM | POA: Insufficient documentation

## 2014-01-03 DIAGNOSIS — M179 Osteoarthritis of knee, unspecified: Secondary | ICD-10-CM | POA: Insufficient documentation

## 2014-01-03 DIAGNOSIS — F3342 Major depressive disorder, recurrent, in full remission: Secondary | ICD-10-CM | POA: Insufficient documentation

## 2014-01-03 DIAGNOSIS — D518 Other vitamin B12 deficiency anemias: Secondary | ICD-10-CM | POA: Diagnosis not present

## 2014-01-03 DIAGNOSIS — M76829 Posterior tibial tendinitis, unspecified leg: Secondary | ICD-10-CM | POA: Insufficient documentation

## 2014-01-03 DIAGNOSIS — I10 Essential (primary) hypertension: Secondary | ICD-10-CM | POA: Insufficient documentation

## 2014-01-03 DIAGNOSIS — M6789 Other specified disorders of synovium and tendon, multiple sites: Secondary | ICD-10-CM

## 2014-01-03 DIAGNOSIS — M1710 Unilateral primary osteoarthritis, unspecified knee: Secondary | ICD-10-CM | POA: Insufficient documentation

## 2014-01-03 DIAGNOSIS — H409 Unspecified glaucoma: Secondary | ICD-10-CM | POA: Insufficient documentation

## 2014-01-03 DIAGNOSIS — I152 Hypertension secondary to endocrine disorders: Secondary | ICD-10-CM | POA: Insufficient documentation

## 2014-01-03 MED ORDER — LISINOPRIL 20 MG PO TABS
10.0000 mg | ORAL_TABLET | Freq: Every day | ORAL | Status: DC
Start: 2014-01-03 — End: 2014-01-22

## 2014-01-03 NOTE — Patient Instructions (Signed)
Blood Pressure  Up some  Start lisinopril  See me in 2 weeks  We will check all your labs in 2 weeks. Don't get the vitamin b12 shot today.   See you soon and great to meet you, Dr. Yong Channel

## 2014-01-03 NOTE — Assessment & Plan Note (Signed)
Poorly controlled on last 3 of 4 measures. Also has CKD stage II. Decision made to start 10mg  lisinopril and follow up in 2 weeks to recheck bmet.

## 2014-01-03 NOTE — Assessment & Plan Note (Signed)
Severe pes planus. Refer to Dr. Oneida Alar for consideration of referral for surgery vs. Orthopedics or other adjustments to help make patient more comfortable.

## 2014-01-03 NOTE — Progress Notes (Signed)
Olivia Reddish, MD Phone: [phone]  Subjective:  Patient presents today to establish care. Chief complaint-noted.   Hypertension BP Readings from Last 3 Encounters:  01/03/14 148/72  12/14/13 142/58  12/04/13 130/84  Home BP monitoring-typically 213Y-865H at home systolic, have never compared cuff here Last 3/4 systolics elevated above 846 and patient with DM Compliant with medications-yes without side effects, atenolol ROS-Denies any CP, HA, SOB.   Vitamin B12 deficiency Last level >1500 3 years ago, despite this has received injections every 3 weeks. Asked patient not to get shot today.  ROS- does have history of neuropathy after chemothreapy in feet, no recent falls  Posterior tibial tendon dysfunction Patient has had long term issues with this. [facility] but adjustment was never to her desired fit. Has had orthotics in past with minimal help. Was told she was not a candidate for surgery due to risks (no evidence of stress testing and DM has been well controlled). Her foot position limits her actvity ROS- denies pain (used to be very painful)  The following were reviewed and entered/updated in epic: Past Medical History  Diagnosis Date  . Hx of breast cancer 08/1992 right    intraductal ca/ w/ microinvasion ER/PR -  . Osteoporosis   . 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 Neuropathy   . AC (acromioclavicular) joint bone spurs   . Diverticulitis   . Headache(784.0)     Migraines  . Hammer toe   . PONV (postoperative nausea and vomiting)   . Hypertension     takes Atenolol daily  . History of bronchitis   . Pneumonia     history of;many yrs ago  . History of migraine     last one about 6-21months ago;Prodrin prn  . Joint pain   . Arthritis     right knee;injections every 57months   . Scoliosis   . Sensitive skin     sees a dermatoligist yrly  . IBS (irritable bowel syndrome)   . Vitamin D  deficiency     takes Vit d daily  . Diabetes mellitus     takes Amaryl and Januvia daily  . Early cataracts, bilateral   . Glaucoma   . Endometriosis   . Postmenopausal atrophic vaginitis     uses Estring  . Gastritis   . Asthma   . COMPRESSION FRACTURE, LUMBAR VERTEBRAE 08/21/2008    Qualifier: Diagnosis of  By: Arnoldo Morale MD, Balinda Quails Kidney stone    Patient Active Problem List   Diagnosis Date Noted  . DIABETES MELLITUS, TYPE II 12/28/2006    Priority: High  . Depression 01/03/2014    Priority: Medium  . Glaucoma 01/03/2014    Priority: Medium  . Essential hypertension, benign 01/03/2014    Priority: Medium  . FATTY LIVER DISEASE 03/18/2009    Priority: Medium  . HYPERLIPIDEMIA 07/26/2007    Priority: Medium  . ANEMIA, B12 DEFICIENCY 12/29/2006    Priority: Medium  . RESTLESS LEG SYNDROME, MILD 12/28/2006    Priority: Medium  . OSTEOPOROSIS 12/12/2006    Priority: Medium  . Posterior tibial tendon dysfunction 01/03/2014    Priority: Low  . Osteoarthritis, knee 01/03/2014    Priority: Low  . Obesity (BMI 30-39.9) 06/15/2013    Priority: Low  . Foot tendinitis 07/20/2010    Priority: Low  . INSOMNIA, CHRONIC 07/25/2009    Priority: Low  . GERD 12/25/2007  Priority: Low  . NEUROPATHY, IDIOPATHIC PERIPHERAL NEC 12/28/2006    Priority: Low  . BREAST CANCER, HX OF 12/12/2006    Priority: Low   Past Surgical History  Procedure Laterality Date  . Cholecystectomy  06/2008  . Tonsillectomy    . Thyroidectomy      51yrs ago. follows endocrine  . Mastectomy Bilateral 1994 right, 1995 left  . Laparoscopic oophorectomy Right 12/2004    absent LSO  . Laparotomy    . Diagnostic laparoscopy    . Adenoidectomy      at age 43  . Excision removed from neck      infected lymph node  . Colonoscopy  11/02/05  . Shoulder hemi-arthroplasty  01/01/2012    Procedure: SHOULDER HEMI-ARTHROPLASTY;  Surgeon: Roseanne Kaufman, MD;  Location: [facility];  Service: Orthopedics;   Laterality: Left;  Left Shoulder Hemi Arthroplasty with Repair and Reconstruction as Necessary   . Endometrial biopsy  06/22/2011    benign polyp, no hyperplasia  . Hysteroscopy  06/22/11    PMB submucosal myoma  . Biopsy on back      benign  . Excision on breast      internal infected suture from breast surgery    Family History  Problem Relation Age of Onset  . Arthritis Mother   . COPD Father   . Heart disease Father     MI 31  . Hypertension Father   . Hyperlipidemia Father   . Colon cancer Neg Hx   . Esophageal cancer Neg Hx   . Rectal cancer Neg Hx   . Stomach cancer Neg Hx     Medications- reviewed and updated Current Outpatient Prescriptions  Medication Sig Dispense Refill  . aspirin 325 MG tablet Take 325 mg by mouth once a week. Monday only      . aspirin 81 MG tablet Take 81 mg by mouth. Daily, except on Mondays      . atenolol (TENORMIN) 25 MG tablet Take 25 mg by mouth daily.      . bimatoprost (LUMIGAN) 0.03 % ophthalmic drops Place 1 drop into both eyes at bedtime.        [name] co-enzyme Q-10 50 MG capsule Take 150 mg by mouth daily.      . cyanocobalamin (,VITAMIN B-12,) 1000 MCG/ML injection Inject 1,000 mcg into the muscle every 21 ( twenty-one) days. Last dose 1/25      . dorzolamide-timolol (COSOPT) 22.3-6.8 MG/ML ophthalmic solution Place into both eyes 2 (two) times daily.       [name] estradiol (ESTRING) 2 MG vaginal ring Place 2 mg vaginally every 3 (three) months. follow package directions  1 each  4  . Flaxseed, Linseed, (FLAX SEED OIL) 1000 MG CAPS Take 1 capsule by mouth daily.       [name] FLUoxetine (PROZAC) 40 MG capsule Take 1 capsule (40 mg total) by mouth daily.  30 capsule  0  . FREESTYLE LITE test strip USE ONE STRIP TO CHECK GLUCOSE EVERY DAY  100 each  0  . glimepiride (AMARYL) 4 MG tablet Take 4 mg by mouth daily with breakfast.      . Grape Seed Extract 100 MG CAPS Take 150 mg by mouth daily.       . Lactobacillus (ACIDOPHILUS PROBIOTIC) 100 MG CAPS  Take 1 capsule (100 mg total) by mouth daily at 6 (six) AM.  30 capsule  11  . Lancets (FREESTYLE) lancets AS DIRECTED  100 each  0  . Liraglutide (  VICTOZA) 18 MG/3ML SOPN Inject 1.8 mg into the skin daily.      . meloxicam (MOBIC) 15 MG tablet TAKE ONE TABLET BY MOUTH ONCE DAILY  90 tablet  0  . Methylcellulose, Laxative, (CITRUCEL) 500 MG TABS Take 2 tablets by mouth daily after supper.       . Multiple Vitamins-Minerals (CENTRUM SILVER PO) Take 1 tablet by mouth daily.       . Omega-3 Fatty Acids (FISH OIL) 1200 MG CAPS Take 1 capsule by mouth 2 (two) times daily.      . pramipexole (MIRAPEX) 0.5 MG tablet Take 0.5 mg by mouth at bedtime.      . Selenium 200 MCG CAPS Take 200 mcg by mouth daily.      . temazepam (RESTORIL) 30 MG capsule TAKE ONE CAPSULE BY MOUTH AT BEDTIME  30 capsule  2  . Vitamin Mixture (ESTER-C) 500-60 MG TABS Take 1 tablet by mouth daily.      [name] lisinopril (PRINIVIL,ZESTRIL) 20 MG tablet Take 0.5 tablets (10 mg total) by mouth daily.  30 tablet  3   No current facility-administered medications for this visit.    Allergies-reviewed and updated Allergies  Allergen Reactions  . Cephalexin Diarrhea    Patient can't remember reaction (per chart at [facility] states diarrhea)  . Erythromycin Ethylsuccinate Hives and Diarrhea  . Nitrofurantoin Other (See Comments)    Severe headache  . Percocet [Oxycodone-Acetaminophen] Itching    History   Social History  . Marital Status: Married    Spouse Name: N/A    Number of Children: N/A  . Years of Education: N/A   Social History Main Topics  . Smoking status: Never Smoker   . Smokeless tobacco: Never Used  . Alcohol Use: No  . Drug Use: No  . Sexual Activity: No   Other Topics Concern  . Not on file   Social History Narrative   Married 39 years in 2015. 1 adopted son. 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   Essential hypertension, benign Poorly controlled on last 3 of 4 measures. Also has CKD stage II. Decision made to start 10mg  lisinopril and follow up in 2 weeks to recheck bmet.   Posterior tibial tendon dysfunction Severe pes planus. Refer to Dr. Oneida Alar for consideration of referral for surgery vs. Orthopedics or other adjustments to help make patient more comfortable.     Orders Placed This Encounter  Procedures  . Ambulatory referral to Sports Medicine    Referral Priority:  Routine    Referral Type:  Consultation    Referred to Provider:  Stefanie Libel, MD    Number of Visits Requested:  1    Meds ordered this encounter  Medications  . lisinopril (PRINIVIL,ZESTRIL) 20 MG tablet    Sig: Take 0.5 tablets (10 mg total) by mouth daily.    Dispense:  30 tablet    Refill:  3

## 2014-01-03 NOTE — Assessment & Plan Note (Signed)
Check b12 at next visit when checking BMET for starting lisinopril.

## 2014-01-15 ENCOUNTER — Other Ambulatory Visit: Payer: Self-pay | Admitting: Internal Medicine

## 2014-01-17 ENCOUNTER — Encounter: Payer: Self-pay | Admitting: Family Medicine

## 2014-01-17 ENCOUNTER — Ambulatory Visit (INDEPENDENT_AMBULATORY_CARE_PROVIDER_SITE_OTHER): Payer: Medicare Other | Admitting: Family Medicine

## 2014-01-17 VITALS — BP 140/62 | HR 88 | Temp 99.8°F | Wt 247.0 lb

## 2014-01-17 DIAGNOSIS — E785 Hyperlipidemia, unspecified: Secondary | ICD-10-CM | POA: Diagnosis not present

## 2014-01-17 DIAGNOSIS — D518 Other vitamin B12 deficiency anemias: Secondary | ICD-10-CM | POA: Diagnosis not present

## 2014-01-17 DIAGNOSIS — Z23 Encounter for immunization: Secondary | ICD-10-CM | POA: Diagnosis not present

## 2014-01-17 DIAGNOSIS — I10 Essential (primary) hypertension: Secondary | ICD-10-CM

## 2014-01-17 DIAGNOSIS — E119 Type 2 diabetes mellitus without complications: Secondary | ICD-10-CM

## 2014-01-17 LAB — HEMOGLOBIN A1C: HEMOGLOBIN A1C: 6.3 % (ref 4.6–6.5)

## 2014-01-17 LAB — BASIC METABOLIC PANEL
BUN: 21 mg/dL (ref 6–23)
CHLORIDE: 104 meq/L (ref 96–112)
CO2: 28 meq/L (ref 19–32)
Calcium: 9.2 mg/dL (ref 8.4–10.5)
Creatinine, Ser: 1 mg/dL (ref 0.4–1.2)
GFR: 56.95 mL/min — ABNORMAL LOW (ref >60.00)
Glucose, Bld: 142 mg/dL — ABNORMAL HIGH (ref 70–99)
Potassium: 4.4 mEq/L (ref 3.5–5.1)
Sodium: 138 mEq/L (ref 135–145)

## 2014-01-17 LAB — VITAMIN B12: Vitamin B-12: 382 pg/mL (ref 211–911)

## 2014-01-17 LAB — LDL CHOLESTEROL, DIRECT: LDL DIRECT: 108.8 mg/dL

## 2014-01-17 MED ORDER — CYANOCOBALAMIN 1000 MCG/ML IJ SOLN: 1000.0000 ug | INTRAMUSCULAR | Status: DC

## 2014-01-17 NOTE — Addendum Note (Signed)
Addended by: Marin Olp on: 01/17/2014 05:05 PM   Modules accepted: Level of Service

## 2014-01-17 NOTE — Patient Instructions (Addendum)
Blood Pressure-take full pill of lisinopril. Check kidney function today.   B12-take once monthly instead of every 3 weeks. Check lab today to see how level is doing. Could also consider trial off medicine and check every 3-6 months.   Cholesterol-well controlled with diet and exercise before. Check LDL to make sure remains well controlle.d   Thanks for sharing about your issues currently going on. We are here for you if you need more support  Prevnar and flu today.

## 2014-01-17 NOTE — Assessment & Plan Note (Signed)
Previously well controlled. Check ldl today. Diet/exercise control

## 2014-01-17 NOTE — Assessment & Plan Note (Signed)
Previously well controlled. unclear underling reason for deficiency. Check b12 today. Gave rx for once monthly instead of q3 weeks with 104mcg. Could consider q 3 month checks if not precipitous drop with 7 weeks no rx.

## 2014-01-17 NOTE — Progress Notes (Signed)
[name], MD Phone: [phone]  Subjective:   [name] is a 73 y.o. year old very pleasant female patient who presents with the following:  Hypertension BP Readings from Last 3 Encounters:  01/17/14 140/62  01/03/14 148/72  12/14/13 142/58  control: goal <140 so slightly above goal Home BP monitoring-no Compliant with medications-yes without side effects ROS-Denies any CP, HA, SOB, blurry vision, LE edema  Vitamin b12 deficiency, ? Pernicious anemia Well controlled previously on q3 week injections. Patient has not had shot in 7 weeks. Not clear if truly has pernicious anemia so plan to check today and consider q3-6 months check if no precipitous drop ROS- no paresthesias. Original symptom was fatigue but did not improve on medicatoin.   Hyperlipidemia LDL several years ago above 100 but on last check <100 within a year-reasonable control. Not on medicatoin ROS- no chest pain or shortness of breath   Past Medical History- Patient Active Problem List   Diagnosis Date Noted  . DIABETES MELLITUS, TYPE II 12/28/2006    Priority: High  . Depression 01/03/2014    Priority: Medium  . Essential hypertension, benign 01/03/2014    Priority: Medium  . FATTY LIVER DISEASE 03/18/2009    Priority: Medium  . HYPERLIPIDEMIA 07/26/2007    Priority: Medium  . ANEMIA, B12 DEFICIENCY 12/29/2006    Priority: Medium  . RESTLESS LEG SYNDROME, MILD 12/28/2006    Priority: Medium  . OSTEOPOROSIS 12/12/2006    Priority: Medium  . Posterior tibial tendon dysfunction 01/03/2014    Priority: Low  . Osteoarthritis, knee 01/03/2014    Priority: Low  . Glaucoma 01/03/2014    Priority: Low  . Obesity (BMI 30-39.9) 06/15/2013    Priority: Low  . Foot tendinitis 07/20/2010    Priority: Low  . INSOMNIA, CHRONIC 07/25/2009    Priority: Low  . GERD 12/25/2007    Priority: Low  . NEUROPATHY, IDIOPATHIC PERIPHERAL NEC 12/28/2006    Priority: Low  . BREAST CANCER, HX OF  12/12/2006    Priority: Low   Medications- reviewed and updated Current Outpatient Prescriptions  Medication Sig Dispense Refill  . aspirin 325 MG tablet Take 325 mg by mouth once a week. Monday only      . aspirin 81 MG tablet Take 81 mg by mouth. Daily, except on Mondays      . atenolol (TENORMIN) 25 MG tablet Take 25 mg by mouth daily.      . bimatoprost (LUMIGAN) 0.03 % ophthalmic drops Place 1 drop into both eyes at bedtime.        [name] co-enzyme Q-10 50 MG capsule Take 150 mg by mouth daily.      . cyanocobalamin (,VITAMIN B-12,) 1000 MCG/ML injection Inject 1 mL (1,000 mcg total) into the muscle every 30 (thirty) days.  1 mL  11  . dorzolamide-timolol (COSOPT) 22.3-6.8 MG/ML ophthalmic solution Place into both eyes 2 (two) times daily.       [name] estradiol (ESTRING) 2 MG vaginal ring Place 2 mg vaginally every 3 (three) months. follow package directions  1 each  4  . Flaxseed, Linseed, (FLAX SEED OIL) 1000 MG CAPS Take 1 capsule by mouth daily.       [name] FLUoxetine (PROZAC) 40 MG capsule Take 1 capsule (40 mg total) by mouth daily.  30 capsule  0  . FREESTYLE LITE test strip USE ONE STRIP TO CHECK GLUCOSE EVERY DAY  100 each  0  . glimepiride (AMARYL) 4 MG tablet Take 4 mg  by mouth daily with breakfast.      . Grape Seed Extract 100 MG CAPS Take 150 mg by mouth daily.       . Lactobacillus (ACIDOPHILUS PROBIOTIC) 100 MG CAPS Take 1 capsule (100 mg total) by mouth daily at 6 (six) AM.  30 capsule  11  . Lancets (FREESTYLE) lancets AS DIRECTED  100 each  0  . Liraglutide (VICTOZA) 18 MG/3ML SOPN Inject 1.8 mg into the skin daily.      [name] lisinopril (PRINIVIL,ZESTRIL) 20 MG tablet Take 0.5 tablets (10 mg total) by mouth daily.  30 tablet  3  . meloxicam (MOBIC) 15 MG tablet TAKE ONE TABLET BY MOUTH ONCE DAILY  90 tablet  0  . Methylcellulose, Laxative, (CITRUCEL) 500 MG TABS Take 2 tablets by mouth daily after supper.       . Multiple Vitamins-Minerals (CENTRUM SILVER PO) Take 1 tablet by mouth  daily.       . Omega-3 Fatty Acids (FISH OIL) 1200 MG CAPS Take 1 capsule by mouth 2 (two) times daily.      . pramipexole (MIRAPEX) 0.5 MG tablet Take 0.5 mg by mouth at bedtime.      . Selenium 200 MCG CAPS Take 200 mcg by mouth daily.      . temazepam (RESTORIL) 30 MG capsule TAKE ONE CAPSULE BY MOUTH AT BEDTIME  30 capsule  2  . VICTOZA 18 MG/3ML SOPN INJECT 0.3MLS (1.8MG ) INTO THE SKIN DAILY  9 pen  1  . Vitamin Mixture (ESTER-C) 500-60 MG TABS Take 1 tablet by mouth daily.       No current facility-administered medications for this visit.    Objective: BP 140/62  Pulse 88  Temp(Src) 99.8 F (37.7 C)  Wt 247 lb (112.038 kg)  LMP 05/10/1992 Gen: NAD, resting comfortably in chair CV: RRR no murmurs rubs or gallops Lungs: CTAB no crackles, wheeze, rhonchi Abdomen: soft/nontender/nondistended/normal bowel sounds.  Ext: no edema Skin: warm, dry, no rash Neuro: grossly normal, moves all extremities  Assessment/Plan:  ANEMIA, B12 DEFICIENCY Previously well controlled. unclear underling reason for deficiency. Check b12 today. Gave rx for once monthly instead of q3 weeks with 1060mcg. Could consider q 3 month checks if not precipitous drop with 7 weeks no rx.   HYPERLIPIDEMIA Previously well controlled. Check ldl today. Diet/exercise control  Essential hypertension, benign Poor control with systolic at 704 with DM. Increase lisinopril to full pill. Follow up 3 months. Check bmet today to ensure no kidney dysfunction on lisinopril.     Orders Placed This Encounter  Procedures  . Pneumococcal conjugate vaccine 13-valent  . LDL cholesterol, direct    [place]  . Basic metabolic panel    [place]  . Vitamin B12  . Hemoglobin A1c    [place]    Meds ordered this encounter  Medications  . cyanocobalamin (,VITAMIN B-12,) 1000 MCG/ML injection    Sig: Inject 1 mL (1,000 mcg total) into the muscle every 30 (thirty) days.    Dispense:  1 mL    Refill:  11

## 2014-01-17 NOTE — Assessment & Plan Note (Signed)
Poor control with systolic at 335 with DM. Increase lisinopril to full pill. Follow up 3 months. Check bmet today to ensure no kidney dysfunction on lisinopril.

## 2014-01-21 ENCOUNTER — Ambulatory Visit (INDEPENDENT_AMBULATORY_CARE_PROVIDER_SITE_OTHER): Payer: Medicare Other | Admitting: *Deleted

## 2014-01-21 VITALS — BP 148/80

## 2014-01-21 DIAGNOSIS — E538 Deficiency of other specified B group vitamins: Secondary | ICD-10-CM | POA: Diagnosis not present

## 2014-01-21 NOTE — Progress Notes (Signed)
Pt comes in today for a nurse visit BP check for the diabetic bundle.  She states that Dr Yong Channel told her to increase her lisinopril 20 mg to 1 tablet once daily.  When she did, she experienced heart palpitations and was a little woozy.  She skipped 1 day and then yesterday she took 1/2 tablet.  She stated she did not experience palpitations but was lightheaded.  Spoke with Dr Yong Channel and Dr Yong Channel stated that he could wait to see her tomorrow 01/22/14.  Per Dr Ansel Bong lab notes he said he was going to consider taking her off of the lisinopril because of her kidney function.  Told pt to take the 1/2 pill tonight and scheduled an OV with Dr Yong Channel at 11 on 01/22/14.  Also gave b12 injection, cause she was due.

## 2014-01-22 ENCOUNTER — Ambulatory Visit (INDEPENDENT_AMBULATORY_CARE_PROVIDER_SITE_OTHER): Payer: Medicare Other | Admitting: Family Medicine

## 2014-01-22 ENCOUNTER — Encounter: Payer: Self-pay | Admitting: Family Medicine

## 2014-01-22 VITALS — BP 120/64 | Temp 99.0°F | Wt 247.0 lb

## 2014-01-22 DIAGNOSIS — I10 Essential (primary) hypertension: Secondary | ICD-10-CM | POA: Diagnosis not present

## 2014-01-22 DIAGNOSIS — N182 Chronic kidney disease, stage 2 (mild): Secondary | ICD-10-CM | POA: Diagnosis not present

## 2014-01-22 LAB — BASIC METABOLIC PANEL
BUN: 19 mg/dL (ref 6–23)
CALCIUM: 9.3 mg/dL (ref 8.4–10.5)
CO2: 29 meq/L (ref 19–32)
Chloride: 102 mEq/L (ref 96–112)
Creatinine, Ser: 0.8 mg/dL (ref 0.4–1.2)
GFR: 77.88 mL/min (ref >60.00)
Glucose, Bld: 135 mg/dL — ABNORMAL HIGH (ref 70–99)
Potassium: 4.5 mEq/L (ref 3.5–5.1)
SODIUM: 139 meq/L (ref 135–145)

## 2014-01-22 MED ORDER — PRAMIPEXOLE DIHYDROCHLORIDE 0.5 MG PO TABS: 0.5000 mg | ORAL_TABLET | Freq: Every day | ORAL | Status: DC

## 2014-01-22 MED ORDER — FLUOXETINE HCL 40 MG PO CAPS: 40.0000 mg | ORAL_CAPSULE | Freq: Every day | ORAL | Status: DC

## 2014-01-22 MED ORDER — GLIMEPIRIDE 4 MG PO TABS: 4.0000 mg | ORAL_TABLET | Freq: Every day | ORAL | Status: DC

## 2014-01-22 MED ORDER — LISINOPRIL 10 MG PO TABS: 10.0000 mg | ORAL_TABLET | Freq: Every day | ORAL | Status: DC

## 2014-01-22 MED ORDER — CYANOCOBALAMIN 1000 MCG/ML IJ SOLN
1000.0000 ug | Freq: Once | INTRAMUSCULAR | Status: AC
  Administered 2014-01-21: 1000 ug via INTRAMUSCULAR

## 2014-01-22 NOTE — Assessment & Plan Note (Addendum)
Hopeful to be able to continue lisinopril. Recheck creatinine today to ensure safety

## 2014-01-22 NOTE — Patient Instructions (Addendum)
Check kidney function today.   Only take 1/2 pill until you run out (10mg ) then I sent in a 10mg  only pill for you.   See you in 3 months

## 2014-01-22 NOTE — Progress Notes (Signed)
[name], MD Phone: [phone]  Subjective:   [name] is a 73 y.o. year old very pleasant female patient who presents with the following:  Hypertension-well controlled CKD stage II-mild worsening with last creatinine check BP Readings from Last 3 Encounters:  01/22/14 120/64  01/21/14 148/80  01/17/14 140/62  Compliant with medications-yes,  With 10 mg of lisinopril but did not tolerate full 20 mg as felt "loopy" and lightheaded. None of these symptoms on half pill Patient did have a slight bump in her creatinine with last check but had been getting over a stomach illness. Discussed need to monitor creatinine by rechecking today. ROS-Denies any CP, HA, SOB, blurry vision, LE edema, transient weakness.   Past Medical History- Patient Active Problem List   Diagnosis Date Noted  . DIABETES MELLITUS, TYPE II 12/28/2006    Priority: High  . CKD (chronic kidney disease), stage II 01/22/2014    Priority: Medium  . Depression 01/03/2014    Priority: Medium  . Essential hypertension, benign 01/03/2014    Priority: Medium  . FATTY LIVER DISEASE 03/18/2009    Priority: Medium  . HYPERLIPIDEMIA 07/26/2007    Priority: Medium  . ANEMIA, B12 DEFICIENCY 12/29/2006    Priority: Medium  . RESTLESS LEG SYNDROME, MILD 12/28/2006    Priority: Medium  . OSTEOPOROSIS 12/12/2006    Priority: Medium  . Posterior tibial tendon dysfunction 01/03/2014    Priority: Low  . Osteoarthritis, knee 01/03/2014    Priority: Low  . Glaucoma 01/03/2014    Priority: Low  . Obesity (BMI 30-39.9) 06/15/2013    Priority: Low  . Foot tendinitis 07/20/2010    Priority: Low  . INSOMNIA, CHRONIC 07/25/2009    Priority: Low  . GERD 12/25/2007    Priority: Low  . NEUROPATHY, IDIOPATHIC PERIPHERAL NEC 12/28/2006    Priority: Low  . BREAST CANCER, HX OF 12/12/2006    Priority: Low   Medications- reviewed and updated Current Outpatient Prescriptions  Medication Sig Dispense Refill  .  aspirin 325 MG tablet Take 325 mg by mouth once a week. Monday only      . aspirin 81 MG tablet Take 81 mg by mouth. Daily, except on Mondays      . atenolol (TENORMIN) 25 MG tablet Take 25 mg by mouth daily.      . bimatoprost (LUMIGAN) 0.03 % ophthalmic drops Place 1 drop into both eyes at bedtime.        [name] co-enzyme Q-10 50 MG capsule Take 150 mg by mouth daily.      . cyanocobalamin (,VITAMIN B-12,) 1000 MCG/ML injection Inject 1 mL (1,000 mcg total) into the muscle every 30 (thirty) days.  1 mL  11  . dorzolamide-timolol (COSOPT) 22.3-6.8 MG/ML ophthalmic solution Place into both eyes 2 (two) times daily.       [name] estradiol (ESTRING) 2 MG vaginal ring Place 2 mg vaginally every 3 (three) months. follow package directions  1 each  4  . Flaxseed, Linseed, (FLAX SEED OIL) 1000 MG CAPS Take 1 capsule by mouth daily.       [name] FLUoxetine (PROZAC) 40 MG capsule Take 1 capsule (40 mg total) by mouth daily.  90 capsule  3  . FREESTYLE LITE test strip USE ONE STRIP TO CHECK GLUCOSE EVERY DAY  100 each  0  . glimepiride (AMARYL) 4 MG tablet Take 1 tablet (4 mg total) by mouth daily with breakfast.  90 tablet  3  . Grape Seed Extract 100 MG  CAPS Take 150 mg by mouth daily.       . Lactobacillus (ACIDOPHILUS PROBIOTIC) 100 MG CAPS Take 1 capsule (100 mg total) by mouth daily at 6 (six) AM.  30 capsule  11  . Lancets (FREESTYLE) lancets AS DIRECTED  100 each  0  . Liraglutide (VICTOZA) 18 MG/3ML SOPN Inject 1.8 mg into the skin daily.      [name] lisinopril (PRINIVIL,ZESTRIL) 10 MG tablet Take 1 tablet (10 mg total) by mouth daily.  90 tablet  3  . meloxicam (MOBIC) 15 MG tablet TAKE ONE TABLET BY MOUTH ONCE DAILY  90 tablet  0  . Methylcellulose, Laxative, (CITRUCEL) 500 MG TABS Take 2 tablets by mouth daily after supper.       . Multiple Vitamins-Minerals (CENTRUM SILVER PO) Take 1 tablet by mouth daily.       . Omega-3 Fatty Acids (FISH OIL) 1200 MG CAPS Take 1 capsule by mouth 2 (two) times daily.      .  pramipexole (MIRAPEX) 0.5 MG tablet Take 1 tablet (0.5 mg total) by mouth at bedtime.  90 tablet  3  . Selenium 200 MCG CAPS Take 200 mcg by mouth daily.      . temazepam (RESTORIL) 30 MG capsule TAKE ONE CAPSULE BY MOUTH AT BEDTIME  30 capsule  2  . VICTOZA 18 MG/3ML SOPN INJECT 0.3MLS (1.8MG ) INTO THE SKIN DAILY  9 pen  1  . Vitamin Mixture (ESTER-C) 500-60 MG TABS Take 1 tablet by mouth daily.       No current facility-administered medications for this visit.    Objective: BP 120/64  Temp(Src) 99 F (37.2 C)  Wt 247 lb (112.038 kg)  LMP 05/10/1992 Gen: NAD, resting comfortably in chair CV: RRR no murmurs rubs or gallops Lungs: CTAB no crackles, wheeze, rhonchi Abdomen: soft/nontender/nondistended/normal bowel sounds.  Ext: no edema Skin: warm, dry Neuro: grossly normal, moves all extremities  Assessment/Plan:  CKD (chronic kidney disease), stage II Hopeful to be able to continue lisinopril. Recheck creatinine today to ensure safety  Essential hypertension, benign Well-controlled with 10 mg lisinopril as well as atenolol 25 mg. Continue. Patient did not tolerate 20 mg lisinopril   Updated medicines for 3 months supply Meds ordered this encounter  Medications  . lisinopril (PRINIVIL,ZESTRIL) 10 MG tablet    Sig: Take 1 tablet (10 mg total) by mouth daily.    Dispense:  90 tablet    Refill:  3  . pramipexole (MIRAPEX) 0.5 MG tablet    Sig: Take 1 tablet (0.5 mg total) by mouth at bedtime.    Dispense:  90 tablet    Refill:  3  . glimepiride (AMARYL) 4 MG tablet    Sig: Take 1 tablet (4 mg total) by mouth daily with breakfast.    Dispense:  90 tablet    Refill:  3  . FLUoxetine (PROZAC) 40 MG capsule    Sig: Take 1 capsule (40 mg total) by mouth daily.    Dispense:  90 capsule    Refill:  3

## 2014-01-22 NOTE — Assessment & Plan Note (Signed)
Well-controlled with 10 mg lisinopril as well as atenolol 25 mg. Continue. Patient did not tolerate 20 mg lisinopril

## 2014-01-24 ENCOUNTER — Encounter: Payer: Self-pay | Admitting: Family Medicine

## 2014-01-30 ENCOUNTER — Ambulatory Visit (INDEPENDENT_AMBULATORY_CARE_PROVIDER_SITE_OTHER): Payer: Medicare Other | Admitting: Sports Medicine

## 2014-01-30 ENCOUNTER — Encounter: Payer: Self-pay | Admitting: Sports Medicine

## 2014-01-30 VITALS — BP 140/56 | Ht 72.0 in | Wt 245.0 lb

## 2014-01-30 DIAGNOSIS — I6529 Occlusion and stenosis of unspecified carotid artery: Secondary | ICD-10-CM

## 2014-01-30 DIAGNOSIS — M25579 Pain in unspecified ankle and joints of unspecified foot: Secondary | ICD-10-CM | POA: Diagnosis not present

## 2014-01-30 DIAGNOSIS — M6789 Other specified disorders of synovium and tendon, multiple sites: Secondary | ICD-10-CM | POA: Diagnosis not present

## 2014-01-30 DIAGNOSIS — M76829 Posterior tibial tendinitis, unspecified leg: Secondary | ICD-10-CM

## 2014-01-30 NOTE — Assessment & Plan Note (Signed)
-  Added soft padded diabetic insoles to her shoes. Added a hammertoe crest to the left 3rd-5th toes. The 2 ulcers were cleaned with betadine and alcohol. Moleskin was formed with central cut-out and applied over 1st toe ulcer to try to reduce the amount of pressure. Tubular foam was cut and fitted around the 1st, 2nd, and 3rd toes. -We fitted and applied a body helix compression sleeve at the left ankle with some correction of her foot deformity. -We will attempt to unload her pressure areas with ulceration. She may end up needing ankle fusion surgery, but her risk factors of diabetes and cardiac risk, as well as her weight make this an understandably challenging clinical situation. -She will follow-up in 1-2 months or sooner if worsening. -Precautions discussed for potentially worsening diabetic pressure ulcers.

## 2014-01-30 NOTE — Progress Notes (Signed)
   Subjective:    Patient ID: [name], female    DOB: [birth year], 73 y.o.   MRN: [id number]  HPI [name] is a 73 year old female who presents at the referral of Dr. [name] with left foot pain and deformity. She has a history of chronic left posterior tibialis tendon dysfunction, which has progressed to cause a callus over the first toe and blistering of possible infection. He just says hammertoe deformities of the lateral 3 toes of her left foot that are somewhat painful. She has been seen at [facility] for consideration of the posterior tibialis tendon surgical correction, but was told that she was not a candidate for surgery due to her diabetes and cardiac risk. She was sent to have a custom rigid left ankle brace orthosis crafted at [facility] in [place]. She says that the rigid brace would cause pain at her medial ankle to to however would rub her foot, and despite multiple adjustments made by the manufacturer, she is still uncomfortable in the custom rigid brace. She has also tried custom foot orthotics, last crafted many years ago that she still wears. She did not bring these with her today. She is now wearing any sort of ankle brace for compression. She is not doing any home exercises. Symptoms are aggravated with walking and relieved with rest. She denies any associated great toe numbness, tingling, or weakness. She denies any joint pain at the great toe.  Past medical, social, medications, and allergies were reviewed and are up-to-date in the chart. Review of Systems 11 point review of systems was performed is otherwise negative, unless noted in the history of present illness.    Objective:   Physical Exam BP 140/56  Ht 6' (1.829 m)  Wt 245 lb (111.131 kg)  BMI 33.22 kg/m2  LMP 05/10/1992 GEN: The patient is well-developed well-nourished female and in no acute distress.  She is awake alert and oriented x3. SKIN: Stage 2 ulcer breakdown at plantar surfaces of left great  toe and 3rd toe. EXTR: No lower extremity edema or calf tenderness Neuro: Strength 5/5 globally. +Diabetic neuropathy throughout bilateral feet. Otherwise sensation intact. DTRs 2/4 bilaterally. No focal deficits. Vasc: +2 bilateral distal pulses. No edema.  MSK: Examination of the bilateral feet reveals a left foot that is held significantly in abduction with passive correction to neutral. She has hammertoe deformities of the left third through fifth toes, and her first toe hyper pronates on the left. She has posterior tibialis tendon weakness. She is barely able to raise her heel off the ground if she is going to stand on her tiptoes on the left. She has a flattened medial longitudinal arch.    Assessment & Plan:  Please see problem based assessment and plan in the problem list.

## 2014-02-21 DIAGNOSIS — L603 Nail dystrophy: Secondary | ICD-10-CM | POA: Diagnosis not present

## 2014-02-21 DIAGNOSIS — L84 Corns and callosities: Secondary | ICD-10-CM | POA: Diagnosis not present

## 2014-02-21 DIAGNOSIS — I739 Peripheral vascular disease, unspecified: Secondary | ICD-10-CM | POA: Diagnosis not present

## 2014-02-21 DIAGNOSIS — L97529 Non-pressure chronic ulcer of other part of left foot with unspecified severity: Secondary | ICD-10-CM | POA: Diagnosis not present

## 2014-02-21 DIAGNOSIS — E1151 Type 2 diabetes mellitus with diabetic peripheral angiopathy without gangrene: Secondary | ICD-10-CM | POA: Diagnosis not present

## 2014-02-22 DIAGNOSIS — D518 Other vitamin B12 deficiency anemias: Secondary | ICD-10-CM | POA: Diagnosis not present

## 2014-03-08 DIAGNOSIS — M7122 Synovial cyst of popliteal space [Baker], left knee: Secondary | ICD-10-CM | POA: Diagnosis not present

## 2014-03-11 ENCOUNTER — Encounter: Payer: Self-pay | Admitting: Sports Medicine

## 2014-03-13 ENCOUNTER — Encounter: Payer: Self-pay | Admitting: Sports Medicine

## 2014-03-13 ENCOUNTER — Ambulatory Visit (INDEPENDENT_AMBULATORY_CARE_PROVIDER_SITE_OTHER): Payer: Medicare Other | Admitting: Sports Medicine

## 2014-03-13 VITALS — BP 132/77 | Ht 72.0 in | Wt 240.0 lb

## 2014-03-13 DIAGNOSIS — M1712 Unilateral primary osteoarthritis, left knee: Secondary | ICD-10-CM | POA: Diagnosis not present

## 2014-03-13 DIAGNOSIS — M1711 Unilateral primary osteoarthritis, right knee: Secondary | ICD-10-CM | POA: Diagnosis not present

## 2014-03-13 DIAGNOSIS — M6789 Other specified disorders of synovium and tendon, multiple sites: Secondary | ICD-10-CM | POA: Diagnosis not present

## 2014-03-13 DIAGNOSIS — E11621 Type 2 diabetes mellitus with foot ulcer: Secondary | ICD-10-CM

## 2014-03-13 DIAGNOSIS — M76829 Posterior tibial tendinitis, unspecified leg: Secondary | ICD-10-CM

## 2014-03-13 DIAGNOSIS — I6529 Occlusion and stenosis of unspecified carotid artery: Secondary | ICD-10-CM | POA: Diagnosis not present

## 2014-03-13 DIAGNOSIS — L97529 Non-pressure chronic ulcer of other part of left foot with unspecified severity: Secondary | ICD-10-CM

## 2014-03-13 NOTE — Assessment & Plan Note (Signed)
-   We are assisting her in arranging an appointment with the [facility] to see if further treatment modalities might be helpful for her first and third left toe ulcers. - We emphasized that if she notices her ulcer is growing in size, fevers, chills, or any concern for infection, that she should get into see the diabetic foot Center, her primary care physician, or return to our clinic if needed. She verbalized understanding and agreement. - She will follow-up with [name] in 3 months or sooner if needed.

## 2014-03-13 NOTE — Progress Notes (Signed)
   Subjective:    Patient ID: [name], female    DOB: [birth year], 73 y.o.   MRN: [id number]  HPI Ms. Viverette presents for follow-up of left foot pain. She has a history of severe posterior tibialis tendon dysfunction, diabetic foot neuropathy, and ulcers of the left first and third toes. Since we last saw her she has been wearing her ankle compression sleeve, which she says has significantly helped her pain. She has been wearing the hammertoe pad, which has helped as well. Despite this, the ulcers on her toes have persisted. She has seen a diabetes counselor, however has not been to the [facility]. She denies any pain which wakes her at night. She is not requiring any medication for her condition. She denies any associated fevers, chills, malaise, or myalgias.  Past medical history, social history, medications, and allergies were reviewed and are up to date in the chart.  Review of Systems 7 point review of systems was performed and was otherwise negative unless noted in the history of present illness.     Objective:   Physical Exam BP 132/77 mmHg  Ht 6' (1.829 m)  Wt 240 lb (108.863 kg)  BMI 32.54 kg/m2  LMP 05/10/1992 GEN: The patient is well-developed well-nourished female and in no acute distress. She is awake alert and oriented x3. SKIN: Stage 2 ulcer breakdown at plantar surfaces of left great toe and 3rd toe, without purulence and minimal surrounding erythema. EXTR: No lower extremity edema or calf tenderness Neuro: Strength 5/5 globally except for PTT weakness on the left. +Diabetic neuropathy throughout bilateral feet. Otherwise sensation intact. DTRs 2/4 bilaterally. No focal deficits. Vasc: +2 bilateral distal pulses. No edema.  MSK: Examination of the bilateral feet reveals a left foot that is held significantly in abduction with passive correction to neutral. She has hammertoe deformities of the left third through fifth toes, and her first toe hyper pronates on  the left. She has posterior tibialis tendon weakness. She is barely able to raise her heel off the ground if she is going to stand on her tiptoes on the left. She has a flattened medial longitudinal arch.     Assessment & Plan:  Please see problem based assessment and plan in the problem list.

## 2014-03-13 NOTE — Assessment & Plan Note (Signed)
-   Scaphoid pads were added to all of the shoes she brought with her to correct some of her medial longitudinal arch flattening. - She was given new hammertoe pads for the left foot as well. - She will continue to wear the left ankle compression sleeve. - Again, she has been told that she would not be a candidate for surgical correction due to her co-morbidities. - She will follow-up with [name] in 3 months or sooner if needed.

## 2014-03-19 ENCOUNTER — Other Ambulatory Visit: Payer: Self-pay | Admitting: Family Medicine

## 2014-03-19 ENCOUNTER — Telehealth: Payer: Self-pay | Admitting: Family Medicine

## 2014-03-19 NOTE — Telephone Encounter (Signed)
[facility] [place], [place] - [address] Is requesting re-fill on temazepam (RESTORIL) 30 MG capsule

## 2014-03-20 DIAGNOSIS — M1711 Unilateral primary osteoarthritis, right knee: Secondary | ICD-10-CM | POA: Diagnosis not present

## 2014-03-20 DIAGNOSIS — M1712 Unilateral primary osteoarthritis, left knee: Secondary | ICD-10-CM | POA: Diagnosis not present

## 2014-03-20 MED ORDER — TEMAZEPAM 30 MG PO CAPS: 30.0000 mg | ORAL_CAPSULE | Freq: Every evening | ORAL | Status: DC | PRN

## 2014-03-20 NOTE — Telephone Encounter (Signed)
yes

## 2014-03-20 NOTE — Telephone Encounter (Signed)
Is this ok to refill?  

## 2014-03-20 NOTE — Telephone Encounter (Signed)
Medication refilled

## 2014-03-26 DIAGNOSIS — H4011X1 Primary open-angle glaucoma, mild stage: Secondary | ICD-10-CM | POA: Diagnosis not present

## 2014-03-27 DIAGNOSIS — D518 Other vitamin B12 deficiency anemias: Secondary | ICD-10-CM | POA: Diagnosis not present

## 2014-03-27 DIAGNOSIS — M170 Bilateral primary osteoarthritis of knee: Secondary | ICD-10-CM | POA: Diagnosis not present

## 2014-04-15 ENCOUNTER — Other Ambulatory Visit: Payer: Self-pay | Admitting: Family Medicine

## 2014-04-16 ENCOUNTER — Ambulatory Visit (INDEPENDENT_AMBULATORY_CARE_PROVIDER_SITE_OTHER): Payer: Medicare Other | Admitting: Obstetrics & Gynecology

## 2014-04-16 VITALS — BP 135/72 | HR 60 | Resp 20 | Wt 243.0 lb

## 2014-04-16 DIAGNOSIS — B373 Candidiasis of vulva and vagina: Secondary | ICD-10-CM | POA: Diagnosis not present

## 2014-04-16 DIAGNOSIS — I6529 Occlusion and stenosis of unspecified carotid artery: Secondary | ICD-10-CM

## 2014-04-16 DIAGNOSIS — N9089 Other specified noninflammatory disorders of vulva and perineum: Secondary | ICD-10-CM

## 2014-04-16 DIAGNOSIS — B3731 Acute candidiasis of vulva and vagina: Secondary | ICD-10-CM

## 2014-04-16 DIAGNOSIS — N952 Postmenopausal atrophic vaginitis: Secondary | ICD-10-CM | POA: Diagnosis not present

## 2014-04-16 MED ORDER — ESTRADIOL 2 MG VA RING: VAGINAL_RING | VAGINAL | Status: DC

## 2014-04-16 MED ORDER — FLUCONAZOLE 150 MG PO TABS: 150.0000 mg | ORAL_TABLET | Freq: Once | ORAL | Status: DC

## 2014-04-16 NOTE — Progress Notes (Signed)
Subjective:     Patient ID: [name], female   DOB: [birth year], 73 y.o.   MRN: [id number]  HPI 73 yo MWF here for estring removal and for vulvar recheck.  Pt has area on right buttocks that was scaly and rough that was biopsies.  No significant finding noted on biopsy with result showing "epidermal hyperplasia and necrosis".  Pt finally figured out that a feminine wipe she was using was the culprit and causing her to have significant irritation.  It is all resolved, she thinks, but wants me to check.  She can't see this area herself, only feel it.    Using Estring every 4 months instead of every 3 so that she can try and have some cost savings.  Denies issues with this change in usage.  No vaginal bleeding.  Pt admits to vaginal discharge that it white and itchy.  Prefers not to use OTC products.   Review of Systems  All other systems reviewed and are negative.      Objective:   Physical Exam  Constitutional: She is oriented to person, place, and time. She appears well-developed and well-nourished.  Genitourinary:    There is no rash, tenderness, lesion or injury on the right labia. There is no rash, tenderness, lesion or injury on the left labia. Right adnexum displays no mass and no tenderness. Left adnexum displays no mass and no tenderness. Vaginal discharge (whitish adherent discharge) found.  Lymphadenopathy:       Right: No inguinal adenopathy present.       Left: No inguinal adenopathy present.  Neurological: She is alert and oriented to person, place, and time.  Skin: Skin is warm and dry.  Psychiatric: She has a normal mood and affect.   Estring removed without difficulty and replaced without difficulty    Assessment:     Vulvar irritation/skin changes that are resolved.  Vaginal atrophic changes Vaginal yeast    Plan:     Diflucan 150mg  po x i, repeat 48 hours if needed  Estring 2mg  every 4 months.  We are stretching this out for pt due to cost.   Return 4  months for replacement

## 2014-04-18 ENCOUNTER — Ambulatory Visit (INDEPENDENT_AMBULATORY_CARE_PROVIDER_SITE_OTHER): Payer: Medicare Other | Admitting: Family Medicine

## 2014-04-18 ENCOUNTER — Encounter: Payer: Self-pay | Admitting: Family Medicine

## 2014-04-18 DIAGNOSIS — F329 Major depressive disorder, single episode, unspecified: Secondary | ICD-10-CM | POA: Diagnosis not present

## 2014-04-18 DIAGNOSIS — F32A Depression, unspecified: Secondary | ICD-10-CM

## 2014-04-18 MED ORDER — MELOXICAM 15 MG PO TABS: 15.0000 mg | ORAL_TABLET | Freq: Every day | ORAL | Status: DC

## 2014-04-18 MED ORDER — TEMAZEPAM 30 MG PO CAPS
30.0000 mg | ORAL_CAPSULE | Freq: Every evening | ORAL | Status: DC | PRN
Start: 2014-04-18 — End: 2014-10-14

## 2014-04-18 NOTE — Progress Notes (Signed)
[name], MD Phone: [phone]  Subjective:   [name] is a 73 y.o. year old very pleasant female patient who presents with the following:  Depression//stress  son with alcohol issue and [place] he [place] on foot to walk back home. While he was there, they were caring for young grandson 39 yo. Now that son is back they are concerned for example for grandson. We discussed many other issues related to this. Patient was considering counseling but is doing much better currently.  ROS- Did have SI by "stopping all medications" for short period but this resolved through talk therapy at [facility].   Past Medical History- Patient Active Problem List   Diagnosis Date Noted  . Diabetes mellitus type II, controlled 12/28/2006    Priority: High  . CKD (chronic kidney disease), stage II 01/22/2014    Priority: Medium  . Depression 01/03/2014    Priority: Medium  . Essential hypertension, benign 01/03/2014    Priority: Medium  . FATTY LIVER DISEASE 03/18/2009    Priority: Medium  . HYPERLIPIDEMIA 07/26/2007    Priority: Medium  . ANEMIA, B12 DEFICIENCY 12/29/2006    Priority: Medium  . RESTLESS LEG SYNDROME, MILD 12/28/2006    Priority: Medium  . OSTEOPOROSIS 12/12/2006    Priority: Medium  . Posterior tibial tendon dysfunction 01/03/2014    Priority: Low  . Osteoarthritis, knee 01/03/2014    Priority: Low  . Glaucoma 01/03/2014    Priority: Low  . Obesity (BMI 30-39.9) 06/15/2013    Priority: Low  . Foot tendinitis 07/20/2010    Priority: Low  . INSOMNIA, CHRONIC 07/25/2009    Priority: Low  . GERD 12/25/2007    Priority: Low  . NEUROPATHY, IDIOPATHIC PERIPHERAL NEC 12/28/2006    Priority: Low  . BREAST CANCER, HX OF 12/12/2006    Priority: Low  . Diabetic ulcer of left foot associated with type 2 diabetes mellitus 03/13/2014   Medications- reviewed and updated Current Outpatient Prescriptions  Medication Sig Dispense Refill  .  atenolol (TENORMIN) 25 MG tablet Take 25 mg by mouth daily.    [name] co-enzyme Q-10 50 MG capsule Take 150 mg by mouth daily.    . cyanocobalamin (,VITAMIN B-12,) 1000 MCG/ML injection Inject 1 mL (1,000 mcg total) into the muscle every 30 (thirty) days. 1 mL 11  . dorzolamide-timolol (COSOPT) 22.3-6.8 MG/ML ophthalmic solution Place into both eyes 2 (two) times daily.     [name] estradiol (ESTRING) 2 MG vaginal ring 1 ring vaginally every four months 1 each 4  . Flaxseed, Linseed, (FLAX SEED OIL) 1000 MG CAPS Take 1 capsule by mouth daily.     . fluconazole (DIFLUCAN) 150 MG tablet Take 1 tablet (150 mg total) by mouth once. Take one tablet.  Repeat in 48 hours if symptoms are not completely resolved. 2 tablet 0  . FLUoxetine (PROZAC) 40 MG capsule Take 1 capsule (40 mg total) by mouth daily. 90 capsule 3  . FREESTYLE LITE test strip USE ONE STRIP TO CHECK GLUCOSE EVERY DAY 100 each 0  . glimepiride (AMARYL) 4 MG tablet Take 1 tablet (4 mg total) by mouth daily with breakfast. 90 tablet 3  . Grape Seed Extract 100 MG CAPS Take 150 mg by mouth daily.     . Lactobacillus (ACIDOPHILUS PROBIOTIC) 100 MG CAPS Take 1 capsule (100 mg total) by mouth daily at 6 (six) AM. 30 capsule 11  . Lancets (FREESTYLE) lancets AS DIRECTED 100 each  0  . lisinopril (PRINIVIL,ZESTRIL) 10 MG tablet Take 1 tablet (10 mg total) by mouth daily. 90 tablet 3  . LUMIGAN 0.01 % SOLN     . meloxicam (MOBIC) 15 MG tablet Take 1 tablet (15 mg total) by mouth daily. 90 tablet 3  . Methylcellulose, Laxative, (CITRUCEL) 500 MG TABS Take 2 tablets by mouth daily after supper.     . Multiple Vitamins-Minerals (CENTRUM SILVER PO) Take 1 tablet by mouth daily.     . mupirocin ointment (BACTROBAN) 2 %     . Omega-3 Fatty Acids (FISH OIL) 1200 MG CAPS Take 1 capsule by mouth 2 (two) times daily.    . pramipexole (MIRAPEX) 0.5 MG tablet Take 1 tablet (0.5 mg total) by mouth at bedtime. 90 tablet 3  . Selenium 200 MCG CAPS Take 200 mcg by mouth  daily.    . temazepam (RESTORIL) 30 MG capsule Take 1 capsule (30 mg total) by mouth at bedtime as needed for sleep. 30 capsule 5  . VICTOZA 18 MG/3ML SOPN INJECT 1.8 MG INTO THE SKIN DAILY 9 pen 0  . Vitamin Mixture (ESTER-C) 500-60 MG TABS Take 1 tablet by mouth daily.     No current facility-administered medications for this visit.     Objective: BP 130/80 mmHg  Pulse 92  Temp(Src) 99 F (37.2 C) (Oral)  Wt 241 lb (109.317 kg)  SpO2 98%  LMP 05/10/1992 Gen: NAD, resting comfortably CV: RRR no murmurs rubs or gallops Lungs: CTAB no crackles, wheeze, rhonchi Abdomen: soft/nontender/nondistended/normal bowel sounds.  Ext: no edema Skin: warm, dry, no rash   Assessment/Plan:  Depression Stressors related to alcoholic adopted son and caring for 35 year old grandson in his absence at times.   >50% of 20 minute office visit was spent on counseling (comforting over diagnosis, CBT techniques) and coordination of care   Return precautions advised. 3 month follow up planned. Refilled temazepam for sleep. Refilled mobic and discussed CKD stage ii -should monitor and consider tramadol as alternate if progresses.   Meds ordered this encounter  Medications  . meloxicam (MOBIC) 15 MG tablet    Sig: Take 1 tablet (15 mg total) by mouth daily.    Dispense:  90 tablet    Refill:  3  . temazepam (RESTORIL) 30 MG capsule    Sig: Take 1 capsule (30 mg total) by mouth at bedtime as needed for sleep.    Dispense:  30 capsule    Refill:  5

## 2014-04-18 NOTE — Assessment & Plan Note (Signed)
Stressors related to alcoholic adopted son and caring for 74 year old grandson in his absence at times.   >50% of 20 minute office visit was spent on counseling (comforting over diagnosis, CBT techniques) and coordination of care

## 2014-04-18 NOTE — Progress Notes (Signed)
Pre visit review using our clinic review tool, if applicable. No additional management support is needed unless otherwise documented below in the visit note. 

## 2014-04-18 NOTE — Patient Instructions (Addendum)
I am glad to hear you had such a positive experience last weekend. Keep using your helper! Your son is truly blessed to have you in his life-keep loving on him like you are!   Refilled temazepam

## 2014-04-26 ENCOUNTER — Encounter: Payer: Self-pay | Admitting: Obstetrics & Gynecology

## 2014-05-06 ENCOUNTER — Other Ambulatory Visit: Payer: Self-pay | Admitting: Family

## 2014-05-17 ENCOUNTER — Other Ambulatory Visit: Payer: Self-pay | Admitting: Family Medicine

## 2014-05-20 DIAGNOSIS — E042 Nontoxic multinodular goiter: Secondary | ICD-10-CM | POA: Diagnosis not present

## 2014-05-22 DIAGNOSIS — E042 Nontoxic multinodular goiter: Secondary | ICD-10-CM | POA: Diagnosis not present

## 2014-05-22 DIAGNOSIS — R5382 Chronic fatigue, unspecified: Secondary | ICD-10-CM | POA: Diagnosis not present

## 2014-05-23 DIAGNOSIS — L84 Corns and callosities: Secondary | ICD-10-CM | POA: Diagnosis not present

## 2014-05-23 DIAGNOSIS — L603 Nail dystrophy: Secondary | ICD-10-CM | POA: Diagnosis not present

## 2014-05-23 DIAGNOSIS — I739 Peripheral vascular disease, unspecified: Secondary | ICD-10-CM | POA: Diagnosis not present

## 2014-05-23 DIAGNOSIS — E1151 Type 2 diabetes mellitus with diabetic peripheral angiopathy without gangrene: Secondary | ICD-10-CM | POA: Diagnosis not present

## 2014-05-24 DIAGNOSIS — D518 Other vitamin B12 deficiency anemias: Secondary | ICD-10-CM | POA: Diagnosis not present

## 2014-05-27 DIAGNOSIS — M7122 Synovial cyst of popliteal space [Baker], left knee: Secondary | ICD-10-CM | POA: Diagnosis not present

## 2014-06-04 ENCOUNTER — Other Ambulatory Visit: Payer: Self-pay

## 2014-06-04 MED ORDER — ATENOLOL 25 MG PO TABS: 25.0000 mg | ORAL_TABLET | Freq: Every day | ORAL | Status: DC

## 2014-06-28 DIAGNOSIS — D518 Other vitamin B12 deficiency anemias: Secondary | ICD-10-CM | POA: Diagnosis not present

## 2014-06-28 DIAGNOSIS — D511 Vitamin B12 deficiency anemia due to selective vitamin B12 malabsorption with proteinuria: Secondary | ICD-10-CM | POA: Diagnosis not present

## 2014-07-15 ENCOUNTER — Other Ambulatory Visit: Payer: Self-pay | Admitting: Family Medicine

## 2014-07-17 ENCOUNTER — Ambulatory Visit (INDEPENDENT_AMBULATORY_CARE_PROVIDER_SITE_OTHER): Payer: Medicare Other | Admitting: Family Medicine

## 2014-07-17 ENCOUNTER — Encounter: Payer: Self-pay | Admitting: Family Medicine

## 2014-07-17 VITALS — BP 128/68 | Temp 98.3°F | Wt 243.0 lb

## 2014-07-17 DIAGNOSIS — L97529 Non-pressure chronic ulcer of other part of left foot with unspecified severity: Secondary | ICD-10-CM

## 2014-07-17 DIAGNOSIS — K589 Irritable bowel syndrome without diarrhea: Secondary | ICD-10-CM

## 2014-07-17 DIAGNOSIS — E11621 Type 2 diabetes mellitus with foot ulcer: Secondary | ICD-10-CM

## 2014-07-17 DIAGNOSIS — R29898 Other symptoms and signs involving the musculoskeletal system: Secondary | ICD-10-CM

## 2014-07-17 DIAGNOSIS — Z789 Other specified health status: Secondary | ICD-10-CM

## 2014-07-17 DIAGNOSIS — M6281 Muscle weakness (generalized): Secondary | ICD-10-CM | POA: Diagnosis not present

## 2014-07-17 NOTE — Progress Notes (Signed)
Olivia Reddish, MD Phone: [phone]  Subjective:   [name] is a 74 y.o. year old very pleasant female patient who presents with the following:  Low back weakness. Sits after 5 mins due to feeling of back weakness and mild pain. Had seen PT previously and requests to return. Core exercises were very helpful but later devleoped depression and did not continue to go to visits.  ROS- no fecal or urinary incontinence or leg weakness.   Dm foot ulcer. Worsening.  l foot toe toe 1 and 3. Had seen sports medicine and had recommended wound care who she had seen before. She held off on referral at that time but requests my opinion today. No pain but has no sensation in her feet due to neuropathy after prior chemo from breast cancer. Does not wear diabetic shoes-always wants to wear higher fashion shoes.  ROS- denies pain, fever ,expanding redness  IBS-D mild poor control Diarrhea after most meals. Has tried bentyl per GI but causes constipation ROS- denies melena or brbpr. Colonoscopy up to date 2015.   Past Medical History- Patient Active Problem List   Diagnosis Date Noted  . Diabetic ulcer of left foot associated with type 2 diabetes mellitus 03/13/2014    Priority: High  . Diabetes mellitus type II, controlled 12/28/2006    Priority: High  . IBS (irritable bowel syndrome) 07/17/2014    Priority: Medium  . Depression 01/03/2014    Priority: Medium  . Essential hypertension, benign 01/03/2014    Priority: Medium  . FATTY LIVER DISEASE 03/18/2009    Priority: Medium  . HYPERLIPIDEMIA 07/26/2007    Priority: Medium  . ANEMIA, B12 DEFICIENCY 12/29/2006    Priority: Medium  . RESTLESS LEG SYNDROME, MILD 12/28/2006    Priority: Medium  . OSTEOPOROSIS 12/12/2006    Priority: Medium  . CKD (chronic kidney disease), stage II 01/22/2014    Priority: Low  . Posterior tibial tendon dysfunction 01/03/2014    Priority: Low  . Osteoarthritis, knee 01/03/2014    Priority: Low  .  Glaucoma 01/03/2014    Priority: Low  . Obesity (BMI 30-39.9) 06/15/2013    Priority: Low  . Foot tendinitis 07/20/2010    Priority: Low  . INSOMNIA, CHRONIC 07/25/2009    Priority: Low  . GERD 12/25/2007    Priority: Low  . NEUROPATHY, IDIOPATHIC PERIPHERAL NEC 12/28/2006    Priority: Low  . BREAST CANCER, HX OF 12/12/2006    Priority: Low   Medications- reviewed and updated Current Outpatient Prescriptions  Medication Sig Dispense Refill  . atenolol (TENORMIN) 25 MG tablet Take 1 tablet (25 mg total) by mouth daily. 90 tablet 1  . co-enzyme Q-10 50 MG capsule Take 150 mg by mouth daily.    . cyanocobalamin (,VITAMIN B-12,) 1000 MCG/ML injection Inject 1 mL (1,000 mcg total) into the muscle every 30 (thirty) days. 1 mL 11  . dorzolamide-timolol (COSOPT) 22.3-6.8 MG/ML ophthalmic solution Place into both eyes 2 (two) times daily.     [name] estradiol (ESTRING) 2 MG vaginal ring 1 ring vaginally every four months 1 each 4  . Flaxseed, Linseed, (FLAX SEED OIL) 1000 MG CAPS Take 1 capsule by mouth daily.     [name] FLUoxetine (PROZAC) 40 MG capsule Take 1 capsule (40 mg total) by mouth daily. 90 capsule 3  . FREESTYLE LITE test strip USE ONE STRIP TO CHECK GLUCOSE EVERY DAY 100 each 0  . glimepiride (AMARYL) 4 MG tablet Take 1 tablet (4 mg total) by  mouth daily with breakfast. 90 tablet 3  . Grape Seed Extract 100 MG CAPS Take 150 mg by mouth daily.     . Lactobacillus (ACIDOPHILUS PROBIOTIC) 100 MG CAPS Take 1 capsule (100 mg total) by mouth daily at 6 (six) AM. 30 capsule 11  . Lancets (FREESTYLE) lancets AS DIRECTED 100 each 0  . lisinopril (PRINIVIL,ZESTRIL) 10 MG tablet Take 1 tablet (10 mg total) by mouth daily. 90 tablet 3  . meloxicam (MOBIC) 15 MG tablet Take 1 tablet (15 mg total) by mouth daily. 90 tablet 3  . Methylcellulose, Laxative, (CITRUCEL) 500 MG TABS Take 2 tablets by mouth daily after supper.     . Multiple Vitamins-Minerals (CENTRUM SILVER PO) Take 1 tablet by mouth daily.      . Omega-3 Fatty Acids (FISH OIL) 1200 MG CAPS Take 1 capsule by mouth 2 (two) times daily.    . pramipexole (MIRAPEX) 0.5 MG tablet Take 1 tablet (0.5 mg total) by mouth at bedtime. 90 tablet 3  . Selenium 200 MCG CAPS Take 200 mcg by mouth daily.    . temazepam (RESTORIL) 30 MG capsule Take 1 capsule (30 mg total) by mouth at bedtime as needed for sleep. 30 capsule 5  . VICTOZA 18 MG/3ML SOPN INJECT 1.8 MG INTO THE SKIN DAILY 9 pen 3  . Vitamin Mixture (ESTER-C) 500-60 MG TABS Take 1 tablet by mouth daily.    . fluconazole (DIFLUCAN) 150 MG tablet Take 1 tablet (150 mg total) by mouth once. Take one tablet.  Repeat in 48 hours if symptoms are not completely resolved. (Patient not taking: Reported on 07/17/2014) 2 tablet 0  . LUMIGAN 0.01 % SOLN     . mupirocin ointment (BACTROBAN) 2 %     . mupirocin ointment (BACTROBAN) 2 % PLACE ONE APPLICATION INTO THE NOSE 2 TIMES DAILY (Patient not taking: Reported on 07/17/2014) 15 g 0   No current facility-administered medications for this visit.    Objective: BP 128/68 mmHg  Temp(Src) 98.3 F (36.8 C)  Wt 243 lb (110.224 kg)  LMP 05/10/1992 Gen: NAD, resting comfortably CV: RRR no murmurs rubs or gallops Lungs: CTAB no crackles, wheeze, rhonchi Abdomen: soft/nontender/nondistended/normal bowel sounds.  Ext: no edema Skin: warm, dry, no rash  L foot on 1st toe has at least 1 cm open ulceration with 2 cm diameter callous (including open and unopened portion. Good granulation tissue underneath. Also 5 mm open ulceration on toe 3.   Diabetic Foot Exam - Simple   Simple Foot Form  Visual Inspection  See comments:  Yes  Sensation Testing  See comments:  Yes  Pulse Check  Posterior Tibialis and Dorsalis pulse intact bilaterally:  Yes  Comments  Ulceration as noted above. Also callous formation toe 2 on left foot. No sensation to monofilament until above ankle.      Assessment/Plan:  IBS (irritable bowel syndrome) Recommended trial of  immodium instead of bentyl to see if less constipation.    Diabetic ulcer of left foot associated with type 2 diabetes mellitus L foot 1st and 3rd toes. Refer to wound care. Needs diabetic shoes ultimately.    Low back weakness. Refer to PT  3 month follow up but sooner Return precautions advised.   Orders Placed This Encounter  Procedures  . AMB referral to wound care center    Referral Priority:  Routine    Referral Type:  Consultation    Number of Visits Requested:  1

## 2014-07-17 NOTE — Assessment & Plan Note (Signed)
Recommended trial of immodium instead of bentyl to see if less constipation.

## 2014-07-17 NOTE — Assessment & Plan Note (Signed)
L foot 1st and 3rd toes. Refer to wound care. Needs diabetic shoes ultimately.

## 2014-07-17 NOTE — Patient Instructions (Addendum)
Diabetic foot exam today.  Eye exam scheduled for 07/29/14, have them send records of exam. Fax # [phone].  Declined any more DEXA scans  Send me the name of the physical therapist and we can refer you  Referred to wound care center  Try immodium 45 minutes before meals instead of bentyl to see if that helps without causing constipation  See you in 3 months. Check a1c then or 6 months.

## 2014-07-27 DIAGNOSIS — D518 Other vitamin B12 deficiency anemias: Secondary | ICD-10-CM | POA: Diagnosis not present

## 2014-07-29 DIAGNOSIS — H4011X3 Primary open-angle glaucoma, severe stage: Secondary | ICD-10-CM | POA: Diagnosis not present

## 2014-07-29 DIAGNOSIS — H4011X1 Primary open-angle glaucoma, mild stage: Secondary | ICD-10-CM | POA: Diagnosis not present

## 2014-07-29 DIAGNOSIS — H2513 Age-related nuclear cataract, bilateral: Secondary | ICD-10-CM | POA: Diagnosis not present

## 2014-08-02 ENCOUNTER — Other Ambulatory Visit: Payer: Self-pay | Admitting: Internal Medicine

## 2014-08-02 ENCOUNTER — Ambulatory Visit (HOSPITAL_COMMUNITY)
Admission: RE | Admit: 2014-08-02 | Discharge: 2014-08-02 | Disposition: A | Discharge status: Home / Self Care | Payer: Medicare Other | Source: Ambulatory Visit | Attending: Internal Medicine | Admitting: Internal Medicine

## 2014-08-02 ENCOUNTER — Encounter (HOSPITAL_BASED_OUTPATIENT_CLINIC_OR_DEPARTMENT_OTHER): Payer: Medicare Other | Attending: Internal Medicine

## 2014-08-02 DIAGNOSIS — M869 Osteomyelitis, unspecified: Secondary | ICD-10-CM

## 2014-08-02 DIAGNOSIS — E1140 Type 2 diabetes mellitus with diabetic neuropathy, unspecified: Secondary | ICD-10-CM | POA: Insufficient documentation

## 2014-08-02 DIAGNOSIS — L97521 Non-pressure chronic ulcer of other part of left foot limited to breakdown of skin: Secondary | ICD-10-CM | POA: Diagnosis not present

## 2014-08-02 DIAGNOSIS — Z9221 Personal history of antineoplastic chemotherapy: Secondary | ICD-10-CM | POA: Insufficient documentation

## 2014-08-02 DIAGNOSIS — E11621 Type 2 diabetes mellitus with foot ulcer: Secondary | ICD-10-CM | POA: Diagnosis not present

## 2014-08-02 DIAGNOSIS — L97529 Non-pressure chronic ulcer of other part of left foot with unspecified severity: Secondary | ICD-10-CM | POA: Diagnosis not present

## 2014-08-02 DIAGNOSIS — Z794 Long term (current) use of insulin: Secondary | ICD-10-CM | POA: Insufficient documentation

## 2014-08-02 DIAGNOSIS — M7989 Other specified soft tissue disorders: Secondary | ICD-10-CM | POA: Diagnosis not present

## 2014-08-02 DIAGNOSIS — Z853 Personal history of malignant neoplasm of breast: Secondary | ICD-10-CM | POA: Diagnosis not present

## 2014-08-02 DIAGNOSIS — Z9013 Acquired absence of bilateral breasts and nipples: Secondary | ICD-10-CM | POA: Insufficient documentation

## 2014-08-05 DIAGNOSIS — I739 Peripheral vascular disease, unspecified: Secondary | ICD-10-CM | POA: Diagnosis not present

## 2014-08-05 DIAGNOSIS — E1151 Type 2 diabetes mellitus with diabetic peripheral angiopathy without gangrene: Secondary | ICD-10-CM | POA: Diagnosis not present

## 2014-08-05 DIAGNOSIS — L603 Nail dystrophy: Secondary | ICD-10-CM | POA: Diagnosis not present

## 2014-08-05 DIAGNOSIS — L84 Corns and callosities: Secondary | ICD-10-CM | POA: Diagnosis not present

## 2014-08-15 ENCOUNTER — Encounter (HOSPITAL_BASED_OUTPATIENT_CLINIC_OR_DEPARTMENT_OTHER): Payer: Medicare Other | Attending: Internal Medicine

## 2014-08-15 ENCOUNTER — Other Ambulatory Visit: Payer: Self-pay | Admitting: Internal Medicine

## 2014-08-15 DIAGNOSIS — L97521 Non-pressure chronic ulcer of other part of left foot limited to breakdown of skin: Secondary | ICD-10-CM | POA: Diagnosis not present

## 2014-08-15 DIAGNOSIS — L84 Corns and callosities: Secondary | ICD-10-CM | POA: Diagnosis not present

## 2014-08-15 DIAGNOSIS — E1140 Type 2 diabetes mellitus with diabetic neuropathy, unspecified: Secondary | ICD-10-CM | POA: Diagnosis not present

## 2014-08-15 DIAGNOSIS — E11621 Type 2 diabetes mellitus with foot ulcer: Secondary | ICD-10-CM | POA: Diagnosis not present

## 2014-08-15 DIAGNOSIS — M2040 Other hammer toe(s) (acquired), unspecified foot: Secondary | ICD-10-CM | POA: Diagnosis not present

## 2014-08-15 DIAGNOSIS — R52 Pain, unspecified: Secondary | ICD-10-CM

## 2014-08-15 DIAGNOSIS — M2140 Flat foot [pes planus] (acquired), unspecified foot: Secondary | ICD-10-CM | POA: Diagnosis not present

## 2014-08-16 ENCOUNTER — Ambulatory Visit (INDEPENDENT_AMBULATORY_CARE_PROVIDER_SITE_OTHER): Payer: Medicare Other | Admitting: Obstetrics & Gynecology

## 2014-08-16 ENCOUNTER — Encounter: Payer: Self-pay | Admitting: Obstetrics & Gynecology

## 2014-08-16 VITALS — BP 122/58 | HR 68 | Resp 16 | Wt 245.0 lb

## 2014-08-16 DIAGNOSIS — N952 Postmenopausal atrophic vaginitis: Secondary | ICD-10-CM

## 2014-08-16 NOTE — Progress Notes (Signed)
Subjective:     Patient ID: [name], female   DOB: 04-05-1941, 74 y.o.   MRN: [id number]  HPI 74 yo G0 (adopted 1) MWF here for Estring removal and replacement.  Using every four months instead of three for some cost savings.  Hasn't really noted any difference doing it this way.  Denies vaginal bleeding or pain.  Reports has tried several other wipes for cleanliness and had irritation with all of them.  Decided not to use any more wipes and is using toilet paper and wash clothes only.  Review of Systems  All other systems reviewed and are negative.      Objective:   Physical Exam  Constitutional: She appears well-developed and well-nourished.  Genitourinary: Vagina normal and uterus normal. There is no rash, tenderness, lesion or injury on the right labia. There is no rash, tenderness, lesion or injury on the left labia. Uterus is not fixed and not tender. Cervix exhibits no motion tenderness.  Vulvar lesion that was previously biopsies appears completely normal.  Estring was removed without difficulty and new one was replaced.    Lymphadenopathy:       Right: No inguinal adenopathy present.       Left: No inguinal adenopathy present.  Skin: Skin is warm and dry.  Psychiatric: She has a normal mood and affect.       Assessment:     Vaginal atrophic changes     Plan:     Plan removal and replacement of Estring in 4 months.  No RX needed.

## 2014-08-18 ENCOUNTER — Encounter: Payer: Self-pay | Admitting: Obstetrics & Gynecology

## 2014-08-19 ENCOUNTER — Other Ambulatory Visit (INDEPENDENT_AMBULATORY_CARE_PROVIDER_SITE_OTHER): Payer: Medicare Other

## 2014-08-19 DIAGNOSIS — E11621 Type 2 diabetes mellitus with foot ulcer: Secondary | ICD-10-CM

## 2014-08-19 DIAGNOSIS — E118 Type 2 diabetes mellitus with unspecified complications: Secondary | ICD-10-CM

## 2014-08-19 LAB — BASIC METABOLIC PANEL
BUN: 23 mg/dL (ref 6–23)
CO2: 27 meq/L (ref 19–32)
CREATININE: 0.62 mg/dL (ref 0.40–1.20)
Calcium: 9.1 mg/dL (ref 8.4–10.5)
Chloride: 105 mEq/L (ref 96–112)
GFR: 99.85 mL/min (ref >60.00)
Glucose, Bld: 143 mg/dL — ABNORMAL HIGH (ref 70–99)
Potassium: 4 mEq/L (ref 3.5–5.1)
SODIUM: 137 meq/L (ref 135–145)

## 2014-08-21 ENCOUNTER — Ambulatory Visit (HOSPITAL_COMMUNITY)
Admission: RE | Admit: 2014-08-21 | Discharge: 2014-08-21 | Disposition: A | Discharge status: Home / Self Care | Payer: Medicare Other | Source: Ambulatory Visit | Attending: Internal Medicine | Admitting: Internal Medicine

## 2014-08-21 DIAGNOSIS — E11621 Type 2 diabetes mellitus with foot ulcer: Secondary | ICD-10-CM | POA: Insufficient documentation

## 2014-08-21 DIAGNOSIS — R52 Pain, unspecified: Secondary | ICD-10-CM

## 2014-08-21 DIAGNOSIS — M2040 Other hammer toe(s) (acquired), unspecified foot: Secondary | ICD-10-CM | POA: Diagnosis not present

## 2014-08-21 DIAGNOSIS — M868X7 Other osteomyelitis, ankle and foot: Secondary | ICD-10-CM | POA: Diagnosis not present

## 2014-08-21 DIAGNOSIS — L97529 Non-pressure chronic ulcer of other part of left foot with unspecified severity: Secondary | ICD-10-CM | POA: Diagnosis not present

## 2014-08-21 MED ORDER — GADOBENATE DIMEGLUMINE 529 MG/ML IV SOLN
20.0000 mL | Freq: Once | INTRAVENOUS | Status: AC | PRN
  Administered 2014-08-21: 20 mL via INTRAVENOUS

## 2014-08-22 ENCOUNTER — Encounter: Payer: Self-pay | Admitting: Family Medicine

## 2014-08-22 DIAGNOSIS — M2140 Flat foot [pes planus] (acquired), unspecified foot: Secondary | ICD-10-CM | POA: Diagnosis not present

## 2014-08-22 DIAGNOSIS — E1140 Type 2 diabetes mellitus with diabetic neuropathy, unspecified: Secondary | ICD-10-CM | POA: Diagnosis not present

## 2014-08-22 DIAGNOSIS — L97521 Non-pressure chronic ulcer of other part of left foot limited to breakdown of skin: Secondary | ICD-10-CM | POA: Diagnosis not present

## 2014-08-22 DIAGNOSIS — M2040 Other hammer toe(s) (acquired), unspecified foot: Secondary | ICD-10-CM | POA: Diagnosis not present

## 2014-08-22 DIAGNOSIS — E11621 Type 2 diabetes mellitus with foot ulcer: Secondary | ICD-10-CM | POA: Diagnosis not present

## 2014-08-22 DIAGNOSIS — L84 Corns and callosities: Secondary | ICD-10-CM | POA: Diagnosis not present

## 2014-08-23 ENCOUNTER — Other Ambulatory Visit: Payer: Self-pay | Admitting: *Deleted

## 2014-08-23 ENCOUNTER — Encounter: Payer: Self-pay | Admitting: Family Medicine

## 2014-08-23 MED ORDER — GLUCOSE BLOOD VI STRP: ORAL_STRIP | Status: DC

## 2014-08-29 ENCOUNTER — Other Ambulatory Visit: Payer: Self-pay | Admitting: Internal Medicine

## 2014-08-29 ENCOUNTER — Ambulatory Visit (HOSPITAL_COMMUNITY)
Admission: RE | Admit: 2014-08-29 | Discharge: 2014-08-29 | Disposition: A | Discharge status: Home / Self Care | Payer: Medicare Other | Source: Ambulatory Visit | Attending: Internal Medicine | Admitting: Internal Medicine

## 2014-08-29 DIAGNOSIS — Z01818 Encounter for other preprocedural examination: Secondary | ICD-10-CM | POA: Diagnosis not present

## 2014-08-29 DIAGNOSIS — R05 Cough: Secondary | ICD-10-CM | POA: Diagnosis not present

## 2014-08-29 DIAGNOSIS — L97529 Non-pressure chronic ulcer of other part of left foot with unspecified severity: Principal | ICD-10-CM

## 2014-08-29 DIAGNOSIS — E1140 Type 2 diabetes mellitus with diabetic neuropathy, unspecified: Secondary | ICD-10-CM | POA: Diagnosis not present

## 2014-08-29 DIAGNOSIS — E11621 Type 2 diabetes mellitus with foot ulcer: Secondary | ICD-10-CM

## 2014-08-29 DIAGNOSIS — L84 Corns and callosities: Secondary | ICD-10-CM | POA: Diagnosis not present

## 2014-08-29 DIAGNOSIS — M2040 Other hammer toe(s) (acquired), unspecified foot: Secondary | ICD-10-CM | POA: Diagnosis not present

## 2014-08-29 DIAGNOSIS — R509 Fever, unspecified: Secondary | ICD-10-CM | POA: Insufficient documentation

## 2014-08-29 DIAGNOSIS — M2140 Flat foot [pes planus] (acquired), unspecified foot: Secondary | ICD-10-CM | POA: Diagnosis not present

## 2014-08-29 DIAGNOSIS — L97521 Non-pressure chronic ulcer of other part of left foot limited to breakdown of skin: Secondary | ICD-10-CM | POA: Diagnosis not present

## 2014-09-02 ENCOUNTER — Encounter (HOSPITAL_COMMUNITY): Payer: Self-pay | Admitting: Emergency Medicine

## 2014-09-02 ENCOUNTER — Emergency Department (HOSPITAL_COMMUNITY): Payer: Medicare Other

## 2014-09-02 ENCOUNTER — Emergency Department (HOSPITAL_COMMUNITY)
Admission: EM | Admit: 2014-09-02 | Discharge: 2014-09-02 | Disposition: A | Discharge status: Home / Self Care | Payer: Medicare Other | Attending: Emergency Medicine | Admitting: Emergency Medicine

## 2014-09-02 DIAGNOSIS — Z79899 Other long term (current) drug therapy: Secondary | ICD-10-CM | POA: Diagnosis not present

## 2014-09-02 DIAGNOSIS — Z8776 Personal history of (corrected) congenital malformations of integument, limbs and musculoskeletal system: Secondary | ICD-10-CM | POA: Diagnosis not present

## 2014-09-02 DIAGNOSIS — G43909 Migraine, unspecified, not intractable, without status migrainosus: Secondary | ICD-10-CM | POA: Diagnosis not present

## 2014-09-02 DIAGNOSIS — J45909 Unspecified asthma, uncomplicated: Secondary | ICD-10-CM | POA: Diagnosis not present

## 2014-09-02 DIAGNOSIS — Z8781 Personal history of (healed) traumatic fracture: Secondary | ICD-10-CM | POA: Diagnosis not present

## 2014-09-02 DIAGNOSIS — Z853 Personal history of malignant neoplasm of breast: Secondary | ICD-10-CM | POA: Diagnosis not present

## 2014-09-02 DIAGNOSIS — J208 Acute bronchitis due to other specified organisms: Secondary | ICD-10-CM

## 2014-09-02 DIAGNOSIS — Z8669 Personal history of other diseases of the nervous system and sense organs: Secondary | ICD-10-CM | POA: Insufficient documentation

## 2014-09-02 DIAGNOSIS — Z8701 Personal history of pneumonia (recurrent): Secondary | ICD-10-CM | POA: Insufficient documentation

## 2014-09-02 DIAGNOSIS — Z792 Long term (current) use of antibiotics: Secondary | ICD-10-CM | POA: Insufficient documentation

## 2014-09-02 DIAGNOSIS — Z87442 Personal history of urinary calculi: Secondary | ICD-10-CM | POA: Diagnosis not present

## 2014-09-02 DIAGNOSIS — H409 Unspecified glaucoma: Secondary | ICD-10-CM | POA: Insufficient documentation

## 2014-09-02 DIAGNOSIS — Z791 Long term (current) use of non-steroidal anti-inflammatories (NSAID): Secondary | ICD-10-CM | POA: Diagnosis not present

## 2014-09-02 DIAGNOSIS — Z8619 Personal history of other infectious and parasitic diseases: Secondary | ICD-10-CM | POA: Diagnosis not present

## 2014-09-02 DIAGNOSIS — M1711 Unilateral primary osteoarthritis, right knee: Secondary | ICD-10-CM | POA: Diagnosis not present

## 2014-09-02 DIAGNOSIS — R05 Cough: Secondary | ICD-10-CM | POA: Diagnosis present

## 2014-09-02 DIAGNOSIS — H269 Unspecified cataract: Secondary | ICD-10-CM | POA: Diagnosis not present

## 2014-09-02 DIAGNOSIS — E119 Type 2 diabetes mellitus without complications: Secondary | ICD-10-CM | POA: Insufficient documentation

## 2014-09-02 DIAGNOSIS — R0989 Other specified symptoms and signs involving the circulatory and respiratory systems: Secondary | ICD-10-CM | POA: Diagnosis not present

## 2014-09-02 DIAGNOSIS — Z8742 Personal history of other diseases of the female genital tract: Secondary | ICD-10-CM | POA: Diagnosis not present

## 2014-09-02 DIAGNOSIS — I10 Essential (primary) hypertension: Secondary | ICD-10-CM | POA: Diagnosis not present

## 2014-09-02 NOTE — Discharge Instructions (Signed)

## 2014-09-02 NOTE — ED Provider Notes (Signed)
CSN: [id number]     Arrival date & time 09/02/14  1455 History  This chart was scribed for non-physician practitioner, Etta Quill, NP working with Orlie Dakin, MD by Frederich Balding, ED scribe. This patient was seen in room TR06C/TR06C and the patient's care was started at 4:15 PM.    Chief Complaint  Patient presents with  . Cough   The history is provided by the patient. No language interpreter was used.    HPI Comments: [name] is a 74 y.o. female who presents to the Emergency Department complaining of a possible foreign body in her throat. Pt was eating an apple and almonds last night, choked, and aspirated something. It now feels like something is stuck in her throat. Pt has been having cough over the last week but thinks it is due to a virus. She denies trouble swallowing, difficulty breathing.  Past Medical History  Diagnosis Date  . Hx of breast cancer 08/1992 right    intraductal ca/ w/ microinvasion ER/PR -  . Osteoporosis   . 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 Neuropathy   . AC (acromioclavicular) joint bone spurs   . Diverticulitis   . Headache(784.0)     Migraines  . Hammer toe   . PONV (postoperative nausea and vomiting)   . Hypertension     takes Atenolol daily  . History of bronchitis   . Pneumonia     history of;many yrs ago  . History of migraine     last one about 6-37months ago;Prodrin prn  . Joint pain   . Arthritis     right knee;injections every 57months   . Scoliosis   . Sensitive skin     sees a dermatoligist yrly  . IBS (irritable bowel syndrome)   . Vitamin D deficiency     takes Vit d daily  . Diabetes mellitus     takes Amaryl and Januvia daily  . Early cataracts, bilateral   . Glaucoma   . Endometriosis   . Postmenopausal atrophic vaginitis     uses Estring  . Gastritis   . Asthma   . COMPRESSION FRACTURE, LUMBAR VERTEBRAE 08/21/2008    Qualifier: Diagnosis of  By: Arnoldo Morale MD, Balinda Quails  Kidney stone    Past Surgical History  Procedure Laterality Date  . Cholecystectomy  06/2008  . Tonsillectomy    . Thyroidectomy      38yrs ago. follows endocrine  . Mastectomy Bilateral 1994 right, 1995 left  . Laparoscopic oophorectomy Right 12/2004    absent LSO  . Laparotomy    . Diagnostic laparoscopy    . Adenoidectomy      at age 52  . Excision removed from neck      infected lymph node  . Colonoscopy  11/02/05  . Shoulder hemi-arthroplasty  01/01/2012    Procedure: SHOULDER HEMI-ARTHROPLASTY;  Surgeon: Roseanne Kaufman, MD;  Location: [facility];  Service: Orthopedics;  Laterality: Left;  Left Shoulder Hemi Arthroplasty with Repair and Reconstruction as Necessary   . Endometrial biopsy  06/22/2011    benign polyp, no hyperplasia  . Hysteroscopy  06/22/11    PMB submucosal myoma  . Biopsy on back      benign  . Excision on breast      internal infected suture from breast surgery  . Foot surgery      left foot-cleaning out areas on toes at the wound center-having MRI  to determine if osteomyelitis   Family History  Problem Relation Age of Onset  . Arthritis Mother   . COPD Father   . Heart disease Father     MI 13  . Hypertension Father   . Hyperlipidemia Father   . Colon cancer Neg Hx   . Esophageal cancer Neg Hx   . Rectal cancer Neg Hx   . Stomach cancer Neg Hx    History  Substance Use Topics  . Smoking status: Never Smoker   . Smokeless tobacco: Never Used  . Alcohol Use: No   OB History    Gravida Para Term Preterm AB TAB SAB Ectopic Multiple Living   0               Obstetric Comments   1 adopted     Review of Systems  HENT: Negative for trouble swallowing.   Respiratory: Positive for cough.   All other systems reviewed and are negative.  Allergies  Cephalexin; Erythromycin ethylsuccinate; Nitrofurantoin; and Percocet  Home Medications   Prior to Admission medications   Medication Sig Start Date End Date Taking? Authorizing Provider  atenolol  (TENORMIN) 25 MG tablet Take 1 tablet (25 mg total) by mouth daily. 06/04/14  Yes Marin Olp, MD  co-enzyme Q-10 50 MG capsule Take 150 mg by mouth daily.   Yes Historical Provider, MD  cyanocobalamin (,VITAMIN B-12,) 1000 MCG/ML injection Inject 1 mL (1,000 mcg total) into the muscle every 30 (thirty) days. 01/17/14  Yes Marin Olp, MD  dorzolamide-timolol (COSOPT) 22.3-6.8 MG/ML ophthalmic solution Place 1 drop into both eyes 2 (two) times daily.  02/08/13  Yes Historical Provider, MD  estradiol (ESTRING) 2 MG vaginal ring 1 ring vaginally every four months 04/16/14  Yes Megan Salon, MD  Flaxseed, Linseed, (FLAX SEED OIL) 1000 MG CAPS Take 1 capsule by mouth daily.    Yes Historical Provider, MD  FLUoxetine (PROZAC) 40 MG capsule Take 1 capsule (40 mg total) by mouth daily. 01/22/14  Yes Marin Olp, MD  glimepiride (AMARYL) 4 MG tablet Take 1 tablet (4 mg total) by mouth daily with breakfast. 01/22/14  Yes Marin Olp, MD  Grape Seed Extract 100 MG CAPS Take 150 mg by mouth daily.    Yes Historical Provider, MD  Lactobacillus (ACIDOPHILUS PROBIOTIC) 100 MG CAPS Take 1 capsule (100 mg total) by mouth daily at 6 (six) AM. 10/30/12  Yes Ricard Dillon, MD  levofloxacin (LEVAQUIN) 500 MG tablet Take 500 mg by mouth daily.   Yes Historical Provider, MD  lisinopril (PRINIVIL,ZESTRIL) 10 MG tablet Take 1 tablet (10 mg total) by mouth daily. 01/22/14  Yes Marin Olp, MD  LUMIGAN 0.01 % SOLN Place 1 drop into both eyes at bedtime.  03/26/14  Yes Historical Provider, MD  meloxicam (MOBIC) 15 MG tablet Take 1 tablet (15 mg total) by mouth daily. 04/18/14  Yes Marin Olp, MD  Methylcellulose, Laxative, (CITRUCEL) 500 MG TABS Take 2 tablets by mouth daily after supper.    Yes Historical Provider, MD  Multiple Vitamins-Minerals (CENTRUM SILVER PO) Take 1 tablet by mouth daily.    Yes Historical Provider, MD  mupirocin ointment (BACTROBAN) 2 % Apply 1 application topically daily as  needed. For rash 12/04/13  Yes Historical Provider, MD  Omega-3 Fatty Acids (FISH OIL) 1200 MG CAPS Take 1 capsule by mouth 2 (two) times daily.   Yes Historical Provider, MD  pramipexole (MIRAPEX) 0.5 MG tablet Take 1 tablet (  0.5 mg total) by mouth at bedtime. 01/22/14  Yes Marin Olp, MD  Selenium 200 MCG CAPS Take 200 mcg by mouth daily.   Yes Historical Provider, MD  temazepam (RESTORIL) 30 MG capsule Take 1 capsule (30 mg total) by mouth at bedtime as needed for sleep. 04/18/14  Yes Marin Olp, MD  VICTOZA 18 MG/3ML SOPN INJECT 1.8 MG INTO THE SKIN DAILY 07/15/14  Yes Marin Olp, MD  Vitamin Mixture (ESTER-C) 500-60 MG TABS Take 1 tablet by mouth daily.   Yes Historical Provider, MD  fluconazole (DIFLUCAN) 150 MG tablet Take 1 tablet (150 mg total) by mouth once. Take one tablet.  Repeat in 48 hours if symptoms are not completely resolved. Patient not taking: Reported on 09/02/2014 04/16/14   Megan Salon, MD  glucose blood (FREESTYLE LITE) test strip USE TO CHECK BLOOD SUGAR DAILY AND PRN 08/23/14   Marin Olp, MD  Lancets (FREESTYLE) lancets AS DIRECTED 05/11/12   Ricard Dillon, MD  mupirocin ointment (BACTROBAN) 2 % PLACE ONE APPLICATION INTO THE NOSE 2 TIMES DAILY Patient not taking: Reported on 09/02/2014 05/06/14   Marin Olp, MD   BP 144/60 mmHg  Pulse 88  Temp(Src) 97.9 F (36.6 C) (Oral)  Resp 22  Ht 6' (1.829 m)  Wt 245 lb (111.131 kg)  BMI 33.22 kg/m2  SpO2 94%  LMP 05/10/1992   Physical Exam  Constitutional: She is oriented to person, place, and time. She appears well-developed and well-nourished. No distress.  HENT:  Head: Normocephalic and atraumatic.  Mouth/Throat: Uvula is midline and oropharynx is clear and moist.  Eyes: Conjunctivae and EOM are normal.  Neck: Neck supple. No tracheal deviation present.  Cardiovascular: Normal rate, regular rhythm and normal heart sounds.   Pulmonary/Chest: Effort normal and breath sounds normal. No  respiratory distress.  Musculoskeletal: Normal range of motion.  Neurological: She is alert and oriented to person, place, and time.  Skin: Skin is warm and dry.  Psychiatric: She has a normal mood and affect. Her behavior is normal.  Nursing note and vitals reviewed.   ED Course  Procedures (including critical care time)  DIAGNOSTIC STUDIES: Oxygen Saturation is 94% on RA, adequate by my interpretation.    COORDINATION OF CARE: 4:18 PM-Discussed treatment plan which includes xray with pt at bedside and pt agreed to plan.   Labs Review Labs Reviewed - No data to display  Imaging Review Dg Chest 2 View  09/02/2014   CLINICAL DATA:  Choking episode with possible aspiration.  EXAM: CHEST  2 VIEW  COMPARISON:  08/29/2014; 11/11/2013  FINDINGS: Grossly unchanged cardiac silhouette and mediastinal contours with minimal atherosclerotic plaque within a tortuous thoracic aorta. The lungs are hyperexpanded with flattening the bilateral diaphragms. There is mild diffuse slightly nodular thickening of the pulmonary interstitium. No discrete focal airspace opacities. No pleural effusion or pneumothorax. No evidence of edema. Unchanged bones including thin flowing syndesmophytes throughout the thoracic spine. Surgical clips overlie the axilla bilaterally. Post left-sided hemi humeral arthroplasty, incompletely evaluated.  IMPRESSION: Findings suggestive of mild lung hyperexpansion and airways disease / bronchitis. No focal airspace opacities to suggest pneumonia or aspiration.   Electronically Signed   By: Sandi Mariscal M.D.   On: 09/02/2014 16:47     EKG Interpretation None     Radiology results reviewed and shared with patient. No indication of foreign body or aspiration pneumonia.  Bronchitic changes noted.  Discharged home, follow-up with PCP, return precautions discussed. MDM  Final diagnoses:  None    Bronchitis.  I personally performed the services described in this documentation,  which was scribed in my presence. The recorded information has been reviewed and is accurate.   Etta Quill, NP 09/02/14 [id number]  Orlie Dakin, MD 09/03/14 Lupita Shutter

## 2014-09-02 NOTE — ED Notes (Signed)
Pt st's she was eating a apple and almonds last night and got choked.  Pt st's she thinks she aspirated and just wants to be checked out.  Pt thinks something is lodged.  No difficulty swallowing and resp distress present

## 2014-09-05 ENCOUNTER — Telehealth: Payer: Self-pay

## 2014-09-05 DIAGNOSIS — L97521 Non-pressure chronic ulcer of other part of left foot limited to breakdown of skin: Secondary | ICD-10-CM | POA: Diagnosis not present

## 2014-09-05 DIAGNOSIS — L84 Corns and callosities: Secondary | ICD-10-CM | POA: Diagnosis not present

## 2014-09-05 DIAGNOSIS — E1140 Type 2 diabetes mellitus with diabetic neuropathy, unspecified: Secondary | ICD-10-CM | POA: Diagnosis not present

## 2014-09-05 DIAGNOSIS — M2040 Other hammer toe(s) (acquired), unspecified foot: Secondary | ICD-10-CM | POA: Diagnosis not present

## 2014-09-05 DIAGNOSIS — M2140 Flat foot [pes planus] (acquired), unspecified foot: Secondary | ICD-10-CM | POA: Diagnosis not present

## 2014-09-05 DIAGNOSIS — E11621 Type 2 diabetes mellitus with foot ulcer: Secondary | ICD-10-CM | POA: Diagnosis not present

## 2014-09-05 NOTE — Telephone Encounter (Signed)
Pt states that the wound care center is currently prescribing that medicine so shes not sure why they sent it to [name].

## 2014-09-05 NOTE — Telephone Encounter (Signed)
Refill request for Levaquin 500mg  #14. Should pt still be on this?

## 2014-09-05 NOTE — Telephone Encounter (Signed)
Likely yes, she has osteomyelitis (infection of bone). Wound care center is prescribing this though from my understanding. Would be my preference that they prescribe it as they are managing the infection-can you ask her to ask them?

## 2014-09-09 ENCOUNTER — Encounter (HOSPITAL_BASED_OUTPATIENT_CLINIC_OR_DEPARTMENT_OTHER): Payer: Medicare Other | Attending: Internal Medicine

## 2014-09-09 DIAGNOSIS — E1140 Type 2 diabetes mellitus with diabetic neuropathy, unspecified: Secondary | ICD-10-CM | POA: Diagnosis not present

## 2014-09-09 DIAGNOSIS — L84 Corns and callosities: Secondary | ICD-10-CM | POA: Insufficient documentation

## 2014-09-09 DIAGNOSIS — L97521 Non-pressure chronic ulcer of other part of left foot limited to breakdown of skin: Secondary | ICD-10-CM | POA: Insufficient documentation

## 2014-09-09 DIAGNOSIS — E11621 Type 2 diabetes mellitus with foot ulcer: Secondary | ICD-10-CM | POA: Insufficient documentation

## 2014-09-09 DIAGNOSIS — M2140 Flat foot [pes planus] (acquired), unspecified foot: Secondary | ICD-10-CM | POA: Diagnosis not present

## 2014-09-09 LAB — GLUCOSE, CAPILLARY
GLUCOSE-CAPILLARY: 178 mg/dL — AB (ref 70–99)
Glucose-Capillary: 122 mg/dL — ABNORMAL HIGH (ref 70–99)

## 2014-09-10 DIAGNOSIS — M2140 Flat foot [pes planus] (acquired), unspecified foot: Secondary | ICD-10-CM | POA: Diagnosis not present

## 2014-09-10 DIAGNOSIS — E1140 Type 2 diabetes mellitus with diabetic neuropathy, unspecified: Secondary | ICD-10-CM | POA: Diagnosis not present

## 2014-09-10 DIAGNOSIS — L84 Corns and callosities: Secondary | ICD-10-CM | POA: Diagnosis not present

## 2014-09-10 DIAGNOSIS — E11621 Type 2 diabetes mellitus with foot ulcer: Secondary | ICD-10-CM | POA: Diagnosis not present

## 2014-09-10 DIAGNOSIS — L97521 Non-pressure chronic ulcer of other part of left foot limited to breakdown of skin: Secondary | ICD-10-CM | POA: Diagnosis not present

## 2014-09-10 LAB — GLUCOSE, CAPILLARY
GLUCOSE-CAPILLARY: 193 mg/dL — AB (ref 70–99)
Glucose-Capillary: 167 mg/dL — ABNORMAL HIGH (ref 70–99)

## 2014-09-11 DIAGNOSIS — E11621 Type 2 diabetes mellitus with foot ulcer: Secondary | ICD-10-CM | POA: Diagnosis not present

## 2014-09-11 DIAGNOSIS — M2140 Flat foot [pes planus] (acquired), unspecified foot: Secondary | ICD-10-CM | POA: Diagnosis not present

## 2014-09-11 DIAGNOSIS — E1140 Type 2 diabetes mellitus with diabetic neuropathy, unspecified: Secondary | ICD-10-CM | POA: Diagnosis not present

## 2014-09-11 DIAGNOSIS — L97521 Non-pressure chronic ulcer of other part of left foot limited to breakdown of skin: Secondary | ICD-10-CM | POA: Diagnosis not present

## 2014-09-11 DIAGNOSIS — L84 Corns and callosities: Secondary | ICD-10-CM | POA: Diagnosis not present

## 2014-09-11 DIAGNOSIS — E0842 Diabetes mellitus due to underlying condition with diabetic polyneuropathy: Secondary | ICD-10-CM | POA: Diagnosis not present

## 2014-09-11 LAB — GLUCOSE, CAPILLARY
Glucose-Capillary: 128 mg/dL — ABNORMAL HIGH (ref 70–99)
Glucose-Capillary: 115 mg/dL — ABNORMAL HIGH (ref 70–99)

## 2014-09-12 DIAGNOSIS — E1140 Type 2 diabetes mellitus with diabetic neuropathy, unspecified: Secondary | ICD-10-CM | POA: Diagnosis not present

## 2014-09-12 DIAGNOSIS — L97521 Non-pressure chronic ulcer of other part of left foot limited to breakdown of skin: Secondary | ICD-10-CM | POA: Diagnosis not present

## 2014-09-12 DIAGNOSIS — E11621 Type 2 diabetes mellitus with foot ulcer: Secondary | ICD-10-CM | POA: Diagnosis not present

## 2014-09-12 DIAGNOSIS — L84 Corns and callosities: Secondary | ICD-10-CM | POA: Diagnosis not present

## 2014-09-12 DIAGNOSIS — M2140 Flat foot [pes planus] (acquired), unspecified foot: Secondary | ICD-10-CM | POA: Diagnosis not present

## 2014-09-12 LAB — GLUCOSE, CAPILLARY
Glucose-Capillary: 159 mg/dL — ABNORMAL HIGH (ref 70–99)
Glucose-Capillary: 131 mg/dL — ABNORMAL HIGH (ref 70–99)

## 2014-09-13 DIAGNOSIS — L97521 Non-pressure chronic ulcer of other part of left foot limited to breakdown of skin: Secondary | ICD-10-CM | POA: Diagnosis not present

## 2014-09-13 DIAGNOSIS — M2140 Flat foot [pes planus] (acquired), unspecified foot: Secondary | ICD-10-CM | POA: Diagnosis not present

## 2014-09-13 DIAGNOSIS — L84 Corns and callosities: Secondary | ICD-10-CM | POA: Diagnosis not present

## 2014-09-13 DIAGNOSIS — E11621 Type 2 diabetes mellitus with foot ulcer: Secondary | ICD-10-CM | POA: Diagnosis not present

## 2014-09-13 DIAGNOSIS — E1140 Type 2 diabetes mellitus with diabetic neuropathy, unspecified: Secondary | ICD-10-CM | POA: Diagnosis not present

## 2014-09-13 LAB — GLUCOSE, CAPILLARY
Glucose-Capillary: 126 mg/dL — ABNORMAL HIGH (ref 70–99)
Glucose-Capillary: 81 mg/dL (ref 70–99)

## 2014-09-14 DIAGNOSIS — D518 Other vitamin B12 deficiency anemias: Secondary | ICD-10-CM | POA: Diagnosis not present

## 2014-09-16 DIAGNOSIS — L84 Corns and callosities: Secondary | ICD-10-CM | POA: Diagnosis not present

## 2014-09-16 DIAGNOSIS — E11621 Type 2 diabetes mellitus with foot ulcer: Secondary | ICD-10-CM | POA: Diagnosis not present

## 2014-09-16 DIAGNOSIS — L97521 Non-pressure chronic ulcer of other part of left foot limited to breakdown of skin: Secondary | ICD-10-CM | POA: Diagnosis not present

## 2014-09-16 DIAGNOSIS — E1140 Type 2 diabetes mellitus with diabetic neuropathy, unspecified: Secondary | ICD-10-CM | POA: Diagnosis not present

## 2014-09-16 DIAGNOSIS — M2140 Flat foot [pes planus] (acquired), unspecified foot: Secondary | ICD-10-CM | POA: Diagnosis not present

## 2014-09-17 ENCOUNTER — Encounter: Payer: Self-pay | Admitting: Family Medicine

## 2014-09-17 DIAGNOSIS — E11621 Type 2 diabetes mellitus with foot ulcer: Secondary | ICD-10-CM | POA: Diagnosis not present

## 2014-09-17 DIAGNOSIS — M2140 Flat foot [pes planus] (acquired), unspecified foot: Secondary | ICD-10-CM | POA: Diagnosis not present

## 2014-09-17 DIAGNOSIS — E1140 Type 2 diabetes mellitus with diabetic neuropathy, unspecified: Secondary | ICD-10-CM | POA: Diagnosis not present

## 2014-09-17 DIAGNOSIS — L97521 Non-pressure chronic ulcer of other part of left foot limited to breakdown of skin: Secondary | ICD-10-CM | POA: Diagnosis not present

## 2014-09-17 DIAGNOSIS — L84 Corns and callosities: Secondary | ICD-10-CM | POA: Diagnosis not present

## 2014-09-17 LAB — GLUCOSE, CAPILLARY
GLUCOSE-CAPILLARY: 140 mg/dL — AB (ref 70–99)
Glucose-Capillary: 171 mg/dL — ABNORMAL HIGH (ref 70–99)
Glucose-Capillary: 91 mg/dL (ref 70–99)
Glucose-Capillary: 88 mg/dL (ref 70–99)

## 2014-09-18 DIAGNOSIS — L84 Corns and callosities: Secondary | ICD-10-CM | POA: Diagnosis not present

## 2014-09-18 DIAGNOSIS — L97521 Non-pressure chronic ulcer of other part of left foot limited to breakdown of skin: Secondary | ICD-10-CM | POA: Diagnosis not present

## 2014-09-18 DIAGNOSIS — E11621 Type 2 diabetes mellitus with foot ulcer: Secondary | ICD-10-CM | POA: Diagnosis not present

## 2014-09-18 DIAGNOSIS — M2140 Flat foot [pes planus] (acquired), unspecified foot: Secondary | ICD-10-CM | POA: Diagnosis not present

## 2014-09-18 DIAGNOSIS — E1140 Type 2 diabetes mellitus with diabetic neuropathy, unspecified: Secondary | ICD-10-CM | POA: Diagnosis not present

## 2014-09-18 LAB — GLUCOSE, CAPILLARY
Glucose-Capillary: 224 mg/dL — ABNORMAL HIGH (ref 70–99)
Glucose-Capillary: 127 mg/dL — ABNORMAL HIGH (ref 70–99)

## 2014-09-19 DIAGNOSIS — E11621 Type 2 diabetes mellitus with foot ulcer: Secondary | ICD-10-CM | POA: Diagnosis not present

## 2014-09-19 DIAGNOSIS — E1140 Type 2 diabetes mellitus with diabetic neuropathy, unspecified: Secondary | ICD-10-CM | POA: Diagnosis not present

## 2014-09-19 DIAGNOSIS — L84 Corns and callosities: Secondary | ICD-10-CM | POA: Diagnosis not present

## 2014-09-19 DIAGNOSIS — L97521 Non-pressure chronic ulcer of other part of left foot limited to breakdown of skin: Secondary | ICD-10-CM | POA: Diagnosis not present

## 2014-09-19 DIAGNOSIS — M2140 Flat foot [pes planus] (acquired), unspecified foot: Secondary | ICD-10-CM | POA: Diagnosis not present

## 2014-09-19 LAB — GLUCOSE, CAPILLARY
Glucose-Capillary: 191 mg/dL — ABNORMAL HIGH (ref 65–99)
Glucose-Capillary: 150 mg/dL — ABNORMAL HIGH (ref 65–99)

## 2014-09-20 ENCOUNTER — Ambulatory Visit (INDEPENDENT_AMBULATORY_CARE_PROVIDER_SITE_OTHER): Payer: Medicare Other | Admitting: Family Medicine

## 2014-09-20 ENCOUNTER — Encounter: Payer: Self-pay | Admitting: Family Medicine

## 2014-09-20 VITALS — BP 110/48 | HR 86 | Temp 98.6°F | Wt 245.0 lb

## 2014-09-20 DIAGNOSIS — M2550 Pain in unspecified joint: Secondary | ICD-10-CM | POA: Diagnosis not present

## 2014-09-20 DIAGNOSIS — E1140 Type 2 diabetes mellitus with diabetic neuropathy, unspecified: Secondary | ICD-10-CM | POA: Diagnosis not present

## 2014-09-20 DIAGNOSIS — E11621 Type 2 diabetes mellitus with foot ulcer: Secondary | ICD-10-CM | POA: Diagnosis not present

## 2014-09-20 DIAGNOSIS — L97521 Non-pressure chronic ulcer of other part of left foot limited to breakdown of skin: Secondary | ICD-10-CM | POA: Diagnosis not present

## 2014-09-20 DIAGNOSIS — M2140 Flat foot [pes planus] (acquired), unspecified foot: Secondary | ICD-10-CM | POA: Diagnosis not present

## 2014-09-20 DIAGNOSIS — L84 Corns and callosities: Secondary | ICD-10-CM | POA: Diagnosis not present

## 2014-09-20 LAB — GLUCOSE, CAPILLARY
GLUCOSE-CAPILLARY: 173 mg/dL — AB (ref 65–99)
GLUCOSE-CAPILLARY: 116 mg/dL — AB (ref 65–99)

## 2014-09-20 MED ORDER — TRAMADOL HCL 50 MG PO TABS: 25.0000 mg | ORAL_TABLET | Freq: Three times a day (TID) | ORAL | Status: DC | PRN

## 2014-09-20 NOTE — Progress Notes (Signed)
[name], MD  Subjective:  [name] is a 74 y.o. year old very pleasant female patient who presents with:  Joint pain -Waking up in the middle of the night at times with body aches, usually starts out with severe R knee with planned replacement 12/29/13. Taking meloxicam 15 mg for arthritis in knees , hands, shoulders, elbows, wrists. This feels like she is aching in all those areas Has not happened in last 2 days. Has had this issue for a few years but increased frequency since going through hyperbaric over last 2 weeks. Sporadic. Took extra aleve and it helped some. On levaquin for 2-3 weeks  ROS- no night sweats, fevers, unintentional weight loss  Past Medical History- DM, diabetic ulcer currently under care of wound center for osteomyelitis which may have led to amputation but improving, HLD, restless legs, fatty liver, HTN, IBS  Medications- reviewed and updated Current Outpatient Prescriptions  Medication Sig Dispense Refill  . atenolol (TENORMIN) 25 MG tablet Take 1 tablet (25 mg total) by mouth daily. 90 tablet 1  . co-enzyme Q-10 50 MG capsule Take 150 mg by mouth daily.    . cyanocobalamin (,VITAMIN B-12,) 1000 MCG/ML injection Inject 1 mL (1,000 mcg total) into the muscle every 30 (thirty) days. 1 mL 11  . dorzolamide-timolol (COSOPT) 22.3-6.8 MG/ML ophthalmic solution Place 1 drop into both eyes 2 (two) times daily.     [name] estradiol (ESTRING) 2 MG vaginal ring 1 ring vaginally every four months 1 each 4  . Flaxseed, Linseed, (FLAX SEED OIL) 1000 MG CAPS Take 1 capsule by mouth daily.     [name] FLUoxetine (PROZAC) 40 MG capsule Take 1 capsule (40 mg total) by mouth daily. 90 capsule 3  . glimepiride (AMARYL) 4 MG tablet Take 1 tablet (4 mg total) by mouth daily with breakfast. 90 tablet 3  . glucose blood (FREESTYLE LITE) test strip USE TO CHECK BLOOD SUGAR DAILY AND PRN 100 each 12  . Grape Seed Extract 100 MG CAPS Take 150 mg by mouth daily.     . Lactobacillus  (ACIDOPHILUS PROBIOTIC) 100 MG CAPS Take 1 capsule (100 mg total) by mouth daily at 6 (six) AM. 30 capsule 11  . Lancets (FREESTYLE) lancets AS DIRECTED 100 each 0  . levofloxacin (LEVAQUIN) 500 MG tablet Take 500 mg by mouth daily.    [name] lisinopril (PRINIVIL,ZESTRIL) 10 MG tablet Take 1 tablet (10 mg total) by mouth daily. 90 tablet 3  . LUMIGAN 0.01 % SOLN Place 1 drop into both eyes at bedtime.     . meloxicam (MOBIC) 15 MG tablet Take 1 tablet (15 mg total) by mouth daily. 90 tablet 3  . Methylcellulose, Laxative, (CITRUCEL) 500 MG TABS Take 2 tablets by mouth daily after supper.     . Multiple Vitamins-Minerals (CENTRUM SILVER PO) Take 1 tablet by mouth daily.     . mupirocin ointment (BACTROBAN) 2 % Apply 1 application topically daily as needed. For rash    . mupirocin ointment (BACTROBAN) 2 % PLACE ONE APPLICATION INTO THE NOSE 2 TIMES DAILY 15 g 0  . Omega-3 Fatty Acids (FISH OIL) 1200 MG CAPS Take 1 capsule by mouth 2 (two) times daily.    . pramipexole (MIRAPEX) 0.5 MG tablet Take 1 tablet (0.5 mg total) by mouth at bedtime. 90 tablet 3  . Selenium 200 MCG CAPS Take 200 mcg by mouth daily.    . temazepam (RESTORIL) 30 MG capsule Take 1 capsule (30 mg total) by  mouth at bedtime as needed for sleep. 30 capsule 5  . VICTOZA 18 MG/3ML SOPN INJECT 1.8 MG INTO THE SKIN DAILY 9 pen 3  . Vitamin Mixture (ESTER-C) 500-60 MG TABS Take 1 tablet by mouth daily.     No current facility-administered medications for this visit.    Objective: BP 110/48 mmHg  Pulse 86  Temp(Src) 98.6 F (37 C)  Wt 245 lb (111.131 kg)  LMP 05/10/1992 Gen: NAD, resting comfortably, not with make up as normal CV: RRR no murmurs rubs or gallops Lungs: CTAB no crackles, wheeze, rhonchi Abdomen: soft/nontender/nondistended/normal bowel sounds. Ext: no edema, L foot in post op shoe with toes 1 and 3 covered Skin: warm, dry  Neuro: grossly normal, moves all extremities, walks with slight limp with post op shoe on    Assessment/Plan:  Joint pain I am suspicious levaquin may be causing the increased arthralgias given reported time course. That being said, she is making imrpovement with her osteomyelitis and think it is important to remain on this. For this reason, will trial tramadol as temporizing pain measure to help her deal with difficulties. This is to avoid the extra aleve she has been taking on top of mobic 15mg .   Meds ordered this encounter  Medications  . traMADol (ULTRAM) 50 MG tablet    Sig: Take 0.5-1 tablets (25-50 mg total) by mouth every 8 (eight) hours as needed.    Dispense:  60 tablet    Refill:  1

## 2014-09-20 NOTE — Patient Instructions (Signed)
Try tramadol for pain. Think flare in pain could be related to levaquin but with your toe progress, don't think we should stop at this point. Try 1/2 tab at night if you wake up.

## 2014-09-23 DIAGNOSIS — M2140 Flat foot [pes planus] (acquired), unspecified foot: Secondary | ICD-10-CM | POA: Diagnosis not present

## 2014-09-23 DIAGNOSIS — E1140 Type 2 diabetes mellitus with diabetic neuropathy, unspecified: Secondary | ICD-10-CM | POA: Diagnosis not present

## 2014-09-23 DIAGNOSIS — L97521 Non-pressure chronic ulcer of other part of left foot limited to breakdown of skin: Secondary | ICD-10-CM | POA: Diagnosis not present

## 2014-09-23 DIAGNOSIS — L84 Corns and callosities: Secondary | ICD-10-CM | POA: Diagnosis not present

## 2014-09-23 DIAGNOSIS — E11621 Type 2 diabetes mellitus with foot ulcer: Secondary | ICD-10-CM | POA: Diagnosis not present

## 2014-09-23 LAB — GLUCOSE, CAPILLARY
Glucose-Capillary: 115 mg/dL — ABNORMAL HIGH (ref 65–99)
Glucose-Capillary: 128 mg/dL — ABNORMAL HIGH (ref 65–99)

## 2014-09-24 ENCOUNTER — Other Ambulatory Visit: Payer: Self-pay | Admitting: Family Medicine

## 2014-09-24 DIAGNOSIS — L97521 Non-pressure chronic ulcer of other part of left foot limited to breakdown of skin: Secondary | ICD-10-CM | POA: Diagnosis not present

## 2014-09-24 DIAGNOSIS — M2140 Flat foot [pes planus] (acquired), unspecified foot: Secondary | ICD-10-CM | POA: Diagnosis not present

## 2014-09-24 DIAGNOSIS — E1140 Type 2 diabetes mellitus with diabetic neuropathy, unspecified: Secondary | ICD-10-CM | POA: Diagnosis not present

## 2014-09-24 DIAGNOSIS — E11621 Type 2 diabetes mellitus with foot ulcer: Secondary | ICD-10-CM | POA: Diagnosis not present

## 2014-09-24 DIAGNOSIS — L84 Corns and callosities: Secondary | ICD-10-CM | POA: Diagnosis not present

## 2014-09-24 LAB — GLUCOSE, CAPILLARY
GLUCOSE-CAPILLARY: 117 mg/dL — AB (ref 65–99)
Glucose-Capillary: 219 mg/dL — ABNORMAL HIGH (ref 65–99)

## 2014-09-25 DIAGNOSIS — E11621 Type 2 diabetes mellitus with foot ulcer: Secondary | ICD-10-CM | POA: Diagnosis not present

## 2014-09-25 DIAGNOSIS — M2140 Flat foot [pes planus] (acquired), unspecified foot: Secondary | ICD-10-CM | POA: Diagnosis not present

## 2014-09-25 DIAGNOSIS — L97521 Non-pressure chronic ulcer of other part of left foot limited to breakdown of skin: Secondary | ICD-10-CM | POA: Diagnosis not present

## 2014-09-25 DIAGNOSIS — E0842 Diabetes mellitus due to underlying condition with diabetic polyneuropathy: Secondary | ICD-10-CM | POA: Diagnosis not present

## 2014-09-25 DIAGNOSIS — E1140 Type 2 diabetes mellitus with diabetic neuropathy, unspecified: Secondary | ICD-10-CM | POA: Diagnosis not present

## 2014-09-25 DIAGNOSIS — L84 Corns and callosities: Secondary | ICD-10-CM | POA: Diagnosis not present

## 2014-09-25 LAB — GLUCOSE, CAPILLARY
Glucose-Capillary: 184 mg/dL — ABNORMAL HIGH (ref 65–99)
Glucose-Capillary: 118 mg/dL — ABNORMAL HIGH (ref 65–99)

## 2014-09-26 DIAGNOSIS — E1140 Type 2 diabetes mellitus with diabetic neuropathy, unspecified: Secondary | ICD-10-CM | POA: Diagnosis not present

## 2014-09-26 DIAGNOSIS — L84 Corns and callosities: Secondary | ICD-10-CM | POA: Diagnosis not present

## 2014-09-26 DIAGNOSIS — E11621 Type 2 diabetes mellitus with foot ulcer: Secondary | ICD-10-CM | POA: Diagnosis not present

## 2014-09-26 DIAGNOSIS — M2140 Flat foot [pes planus] (acquired), unspecified foot: Secondary | ICD-10-CM | POA: Diagnosis not present

## 2014-09-26 DIAGNOSIS — L97521 Non-pressure chronic ulcer of other part of left foot limited to breakdown of skin: Secondary | ICD-10-CM | POA: Diagnosis not present

## 2014-09-26 LAB — GLUCOSE, CAPILLARY
GLUCOSE-CAPILLARY: 163 mg/dL — AB (ref 65–99)
Glucose-Capillary: 103 mg/dL — ABNORMAL HIGH (ref 65–99)

## 2014-09-27 DIAGNOSIS — M2140 Flat foot [pes planus] (acquired), unspecified foot: Secondary | ICD-10-CM | POA: Diagnosis not present

## 2014-09-27 DIAGNOSIS — L97521 Non-pressure chronic ulcer of other part of left foot limited to breakdown of skin: Secondary | ICD-10-CM | POA: Diagnosis not present

## 2014-09-27 DIAGNOSIS — E11621 Type 2 diabetes mellitus with foot ulcer: Secondary | ICD-10-CM | POA: Diagnosis not present

## 2014-09-27 DIAGNOSIS — L84 Corns and callosities: Secondary | ICD-10-CM | POA: Diagnosis not present

## 2014-09-27 DIAGNOSIS — E1140 Type 2 diabetes mellitus with diabetic neuropathy, unspecified: Secondary | ICD-10-CM | POA: Diagnosis not present

## 2014-09-30 DIAGNOSIS — L97521 Non-pressure chronic ulcer of other part of left foot limited to breakdown of skin: Secondary | ICD-10-CM | POA: Diagnosis not present

## 2014-09-30 DIAGNOSIS — M2140 Flat foot [pes planus] (acquired), unspecified foot: Secondary | ICD-10-CM | POA: Diagnosis not present

## 2014-09-30 DIAGNOSIS — L84 Corns and callosities: Secondary | ICD-10-CM | POA: Diagnosis not present

## 2014-09-30 DIAGNOSIS — E11621 Type 2 diabetes mellitus with foot ulcer: Secondary | ICD-10-CM | POA: Diagnosis not present

## 2014-09-30 DIAGNOSIS — E1140 Type 2 diabetes mellitus with diabetic neuropathy, unspecified: Secondary | ICD-10-CM | POA: Diagnosis not present

## 2014-09-30 LAB — GLUCOSE, CAPILLARY
GLUCOSE-CAPILLARY: 101 mg/dL — AB (ref 65–99)
GLUCOSE-CAPILLARY: 115 mg/dL — AB (ref 65–99)
Glucose-Capillary: 136 mg/dL — ABNORMAL HIGH (ref 65–99)
Glucose-Capillary: 182 mg/dL — ABNORMAL HIGH (ref 65–99)

## 2014-10-01 DIAGNOSIS — L84 Corns and callosities: Secondary | ICD-10-CM | POA: Diagnosis not present

## 2014-10-01 DIAGNOSIS — M2140 Flat foot [pes planus] (acquired), unspecified foot: Secondary | ICD-10-CM | POA: Diagnosis not present

## 2014-10-01 DIAGNOSIS — E11621 Type 2 diabetes mellitus with foot ulcer: Secondary | ICD-10-CM | POA: Diagnosis not present

## 2014-10-01 DIAGNOSIS — L97521 Non-pressure chronic ulcer of other part of left foot limited to breakdown of skin: Secondary | ICD-10-CM | POA: Diagnosis not present

## 2014-10-01 DIAGNOSIS — E1140 Type 2 diabetes mellitus with diabetic neuropathy, unspecified: Secondary | ICD-10-CM | POA: Diagnosis not present

## 2014-10-01 LAB — GLUCOSE, CAPILLARY
Glucose-Capillary: 184 mg/dL — ABNORMAL HIGH (ref 65–99)
Glucose-Capillary: 118 mg/dL — ABNORMAL HIGH (ref 65–99)

## 2014-10-02 DIAGNOSIS — E1140 Type 2 diabetes mellitus with diabetic neuropathy, unspecified: Secondary | ICD-10-CM | POA: Diagnosis not present

## 2014-10-02 DIAGNOSIS — E11621 Type 2 diabetes mellitus with foot ulcer: Secondary | ICD-10-CM | POA: Diagnosis not present

## 2014-10-02 DIAGNOSIS — E0842 Diabetes mellitus due to underlying condition with diabetic polyneuropathy: Secondary | ICD-10-CM | POA: Diagnosis not present

## 2014-10-02 DIAGNOSIS — L84 Corns and callosities: Secondary | ICD-10-CM | POA: Diagnosis not present

## 2014-10-02 DIAGNOSIS — M2140 Flat foot [pes planus] (acquired), unspecified foot: Secondary | ICD-10-CM | POA: Diagnosis not present

## 2014-10-02 DIAGNOSIS — L97521 Non-pressure chronic ulcer of other part of left foot limited to breakdown of skin: Secondary | ICD-10-CM | POA: Diagnosis not present

## 2014-10-03 DIAGNOSIS — E1140 Type 2 diabetes mellitus with diabetic neuropathy, unspecified: Secondary | ICD-10-CM | POA: Diagnosis not present

## 2014-10-03 DIAGNOSIS — L84 Corns and callosities: Secondary | ICD-10-CM | POA: Diagnosis not present

## 2014-10-03 DIAGNOSIS — L97521 Non-pressure chronic ulcer of other part of left foot limited to breakdown of skin: Secondary | ICD-10-CM | POA: Diagnosis not present

## 2014-10-03 DIAGNOSIS — M2140 Flat foot [pes planus] (acquired), unspecified foot: Secondary | ICD-10-CM | POA: Diagnosis not present

## 2014-10-03 DIAGNOSIS — E11621 Type 2 diabetes mellitus with foot ulcer: Secondary | ICD-10-CM | POA: Diagnosis not present

## 2014-10-03 LAB — GLUCOSE, CAPILLARY
GLUCOSE-CAPILLARY: 197 mg/dL — AB (ref 65–99)
Glucose-Capillary: 207 mg/dL — ABNORMAL HIGH (ref 65–99)
Glucose-Capillary: 139 mg/dL — ABNORMAL HIGH (ref 65–99)
Glucose-Capillary: 136 mg/dL — ABNORMAL HIGH (ref 65–99)

## 2014-10-04 DIAGNOSIS — L97521 Non-pressure chronic ulcer of other part of left foot limited to breakdown of skin: Secondary | ICD-10-CM | POA: Diagnosis not present

## 2014-10-04 DIAGNOSIS — E1140 Type 2 diabetes mellitus with diabetic neuropathy, unspecified: Secondary | ICD-10-CM | POA: Diagnosis not present

## 2014-10-04 DIAGNOSIS — L84 Corns and callosities: Secondary | ICD-10-CM | POA: Diagnosis not present

## 2014-10-04 DIAGNOSIS — M2140 Flat foot [pes planus] (acquired), unspecified foot: Secondary | ICD-10-CM | POA: Diagnosis not present

## 2014-10-04 DIAGNOSIS — E11621 Type 2 diabetes mellitus with foot ulcer: Secondary | ICD-10-CM | POA: Diagnosis not present

## 2014-10-08 DIAGNOSIS — L84 Corns and callosities: Secondary | ICD-10-CM | POA: Diagnosis not present

## 2014-10-08 DIAGNOSIS — M2140 Flat foot [pes planus] (acquired), unspecified foot: Secondary | ICD-10-CM | POA: Diagnosis not present

## 2014-10-08 DIAGNOSIS — L97521 Non-pressure chronic ulcer of other part of left foot limited to breakdown of skin: Secondary | ICD-10-CM | POA: Diagnosis not present

## 2014-10-08 DIAGNOSIS — E11621 Type 2 diabetes mellitus with foot ulcer: Secondary | ICD-10-CM | POA: Diagnosis not present

## 2014-10-08 DIAGNOSIS — E1140 Type 2 diabetes mellitus with diabetic neuropathy, unspecified: Secondary | ICD-10-CM | POA: Diagnosis not present

## 2014-10-08 LAB — GLUCOSE, CAPILLARY
GLUCOSE-CAPILLARY: 110 mg/dL — AB (ref 65–99)
GLUCOSE-CAPILLARY: 172 mg/dL — AB (ref 65–99)
GLUCOSE-CAPILLARY: 137 mg/dL — AB (ref 65–99)
Glucose-Capillary: 152 mg/dL — ABNORMAL HIGH (ref 65–99)

## 2014-10-09 ENCOUNTER — Encounter (HOSPITAL_BASED_OUTPATIENT_CLINIC_OR_DEPARTMENT_OTHER): Payer: Medicare Other | Attending: Internal Medicine

## 2014-10-09 DIAGNOSIS — H409 Unspecified glaucoma: Secondary | ICD-10-CM | POA: Diagnosis not present

## 2014-10-09 DIAGNOSIS — M1990 Unspecified osteoarthritis, unspecified site: Secondary | ICD-10-CM | POA: Diagnosis not present

## 2014-10-09 DIAGNOSIS — L309 Dermatitis, unspecified: Secondary | ICD-10-CM | POA: Diagnosis not present

## 2014-10-09 DIAGNOSIS — E0842 Diabetes mellitus due to underlying condition with diabetic polyneuropathy: Secondary | ICD-10-CM | POA: Diagnosis not present

## 2014-10-09 DIAGNOSIS — I890 Lymphedema, not elsewhere classified: Secondary | ICD-10-CM | POA: Diagnosis not present

## 2014-10-09 DIAGNOSIS — E1142 Type 2 diabetes mellitus with diabetic polyneuropathy: Secondary | ICD-10-CM | POA: Insufficient documentation

## 2014-10-09 DIAGNOSIS — L97521 Non-pressure chronic ulcer of other part of left foot limited to breakdown of skin: Secondary | ICD-10-CM | POA: Diagnosis not present

## 2014-10-09 DIAGNOSIS — I10 Essential (primary) hypertension: Secondary | ICD-10-CM | POA: Diagnosis not present

## 2014-10-09 DIAGNOSIS — E11621 Type 2 diabetes mellitus with foot ulcer: Secondary | ICD-10-CM | POA: Insufficient documentation

## 2014-10-09 LAB — GLUCOSE, CAPILLARY
GLUCOSE-CAPILLARY: 172 mg/dL — AB (ref 65–99)
Glucose-Capillary: 81 mg/dL (ref 65–99)

## 2014-10-10 DIAGNOSIS — E11621 Type 2 diabetes mellitus with foot ulcer: Secondary | ICD-10-CM | POA: Diagnosis not present

## 2014-10-10 DIAGNOSIS — E1142 Type 2 diabetes mellitus with diabetic polyneuropathy: Secondary | ICD-10-CM | POA: Diagnosis not present

## 2014-10-10 DIAGNOSIS — L97521 Non-pressure chronic ulcer of other part of left foot limited to breakdown of skin: Secondary | ICD-10-CM | POA: Diagnosis not present

## 2014-10-10 DIAGNOSIS — I890 Lymphedema, not elsewhere classified: Secondary | ICD-10-CM | POA: Diagnosis not present

## 2014-10-10 DIAGNOSIS — I10 Essential (primary) hypertension: Secondary | ICD-10-CM | POA: Diagnosis not present

## 2014-10-10 DIAGNOSIS — H409 Unspecified glaucoma: Secondary | ICD-10-CM | POA: Diagnosis not present

## 2014-10-10 LAB — GLUCOSE, CAPILLARY
GLUCOSE-CAPILLARY: 166 mg/dL — AB (ref 65–99)
Glucose-Capillary: 91 mg/dL (ref 65–99)

## 2014-10-11 DIAGNOSIS — I890 Lymphedema, not elsewhere classified: Secondary | ICD-10-CM | POA: Diagnosis not present

## 2014-10-11 DIAGNOSIS — H409 Unspecified glaucoma: Secondary | ICD-10-CM | POA: Diagnosis not present

## 2014-10-11 DIAGNOSIS — L97521 Non-pressure chronic ulcer of other part of left foot limited to breakdown of skin: Secondary | ICD-10-CM | POA: Diagnosis not present

## 2014-10-11 DIAGNOSIS — E1142 Type 2 diabetes mellitus with diabetic polyneuropathy: Secondary | ICD-10-CM | POA: Diagnosis not present

## 2014-10-11 DIAGNOSIS — E11621 Type 2 diabetes mellitus with foot ulcer: Secondary | ICD-10-CM | POA: Diagnosis not present

## 2014-10-11 DIAGNOSIS — I10 Essential (primary) hypertension: Secondary | ICD-10-CM | POA: Diagnosis not present

## 2014-10-14 ENCOUNTER — Telehealth: Payer: Self-pay | Admitting: Family Medicine

## 2014-10-14 ENCOUNTER — Telehealth: Payer: Self-pay

## 2014-10-14 DIAGNOSIS — E1142 Type 2 diabetes mellitus with diabetic polyneuropathy: Secondary | ICD-10-CM | POA: Diagnosis not present

## 2014-10-14 DIAGNOSIS — I890 Lymphedema, not elsewhere classified: Secondary | ICD-10-CM | POA: Diagnosis not present

## 2014-10-14 DIAGNOSIS — E11621 Type 2 diabetes mellitus with foot ulcer: Secondary | ICD-10-CM | POA: Diagnosis not present

## 2014-10-14 DIAGNOSIS — I10 Essential (primary) hypertension: Secondary | ICD-10-CM | POA: Diagnosis not present

## 2014-10-14 DIAGNOSIS — H409 Unspecified glaucoma: Secondary | ICD-10-CM | POA: Diagnosis not present

## 2014-10-14 DIAGNOSIS — L97521 Non-pressure chronic ulcer of other part of left foot limited to breakdown of skin: Secondary | ICD-10-CM | POA: Diagnosis not present

## 2014-10-14 LAB — GLUCOSE, CAPILLARY
Glucose-Capillary: 138 mg/dL — ABNORMAL HIGH (ref 65–99)
Glucose-Capillary: 195 mg/dL — ABNORMAL HIGH (ref 65–99)

## 2014-10-14 MED ORDER — TEMAZEPAM 30 MG PO CAPS: 30.0000 mg | ORAL_CAPSULE | Freq: Every evening | ORAL | Status: DC | PRN

## 2014-10-14 NOTE — Telephone Encounter (Signed)
Ok to fill 

## 2014-10-14 NOTE — Telephone Encounter (Signed)
Medication called in 

## 2014-10-14 NOTE — Telephone Encounter (Signed)
Patient would like re-fill on temazepam (RESTORIL) 30 MG capsule sent to [facility] [place], [place] - [address]

## 2014-10-14 NOTE — Telephone Encounter (Signed)
yes

## 2014-10-14 NOTE — Telephone Encounter (Signed)
Left message to call us back (concerning overdue mammogram.

## 2014-10-14 NOTE — Telephone Encounter (Signed)
History of breast cancer, bi lateral mastectomy.  Has no breast.

## 2014-10-15 DIAGNOSIS — H409 Unspecified glaucoma: Secondary | ICD-10-CM | POA: Diagnosis not present

## 2014-10-15 DIAGNOSIS — I10 Essential (primary) hypertension: Secondary | ICD-10-CM | POA: Diagnosis not present

## 2014-10-15 DIAGNOSIS — E1142 Type 2 diabetes mellitus with diabetic polyneuropathy: Secondary | ICD-10-CM | POA: Diagnosis not present

## 2014-10-15 DIAGNOSIS — L97521 Non-pressure chronic ulcer of other part of left foot limited to breakdown of skin: Secondary | ICD-10-CM | POA: Diagnosis not present

## 2014-10-15 DIAGNOSIS — I890 Lymphedema, not elsewhere classified: Secondary | ICD-10-CM | POA: Diagnosis not present

## 2014-10-15 DIAGNOSIS — E11621 Type 2 diabetes mellitus with foot ulcer: Secondary | ICD-10-CM | POA: Diagnosis not present

## 2014-10-15 LAB — GLUCOSE, CAPILLARY
GLUCOSE-CAPILLARY: 77 mg/dL (ref 65–99)
GLUCOSE-CAPILLARY: 84 mg/dL (ref 65–99)
Glucose-Capillary: 137 mg/dL — ABNORMAL HIGH (ref 65–99)
Glucose-Capillary: 165 mg/dL — ABNORMAL HIGH (ref 65–99)

## 2014-10-16 DIAGNOSIS — L97521 Non-pressure chronic ulcer of other part of left foot limited to breakdown of skin: Secondary | ICD-10-CM | POA: Diagnosis not present

## 2014-10-16 DIAGNOSIS — I890 Lymphedema, not elsewhere classified: Secondary | ICD-10-CM | POA: Diagnosis not present

## 2014-10-16 DIAGNOSIS — E0842 Diabetes mellitus due to underlying condition with diabetic polyneuropathy: Secondary | ICD-10-CM | POA: Diagnosis not present

## 2014-10-16 DIAGNOSIS — E1142 Type 2 diabetes mellitus with diabetic polyneuropathy: Secondary | ICD-10-CM | POA: Diagnosis not present

## 2014-10-16 DIAGNOSIS — H409 Unspecified glaucoma: Secondary | ICD-10-CM | POA: Diagnosis not present

## 2014-10-16 DIAGNOSIS — I10 Essential (primary) hypertension: Secondary | ICD-10-CM | POA: Diagnosis not present

## 2014-10-16 DIAGNOSIS — E11621 Type 2 diabetes mellitus with foot ulcer: Secondary | ICD-10-CM | POA: Diagnosis not present

## 2014-10-17 ENCOUNTER — Ambulatory Visit (INDEPENDENT_AMBULATORY_CARE_PROVIDER_SITE_OTHER): Payer: Medicare Other | Admitting: Family Medicine

## 2014-10-17 ENCOUNTER — Encounter: Payer: Self-pay | Admitting: Family Medicine

## 2014-10-17 VITALS — BP 106/64 | HR 98 | Temp 98.7°F | Wt 245.0 lb

## 2014-10-17 DIAGNOSIS — L97529 Non-pressure chronic ulcer of other part of left foot with unspecified severity: Secondary | ICD-10-CM

## 2014-10-17 DIAGNOSIS — H409 Unspecified glaucoma: Secondary | ICD-10-CM | POA: Diagnosis not present

## 2014-10-17 DIAGNOSIS — I10 Essential (primary) hypertension: Secondary | ICD-10-CM

## 2014-10-17 DIAGNOSIS — E11621 Type 2 diabetes mellitus with foot ulcer: Secondary | ICD-10-CM | POA: Diagnosis not present

## 2014-10-17 DIAGNOSIS — E785 Hyperlipidemia, unspecified: Secondary | ICD-10-CM | POA: Diagnosis not present

## 2014-10-17 DIAGNOSIS — D518 Other vitamin B12 deficiency anemias: Secondary | ICD-10-CM

## 2014-10-17 DIAGNOSIS — E119 Type 2 diabetes mellitus without complications: Secondary | ICD-10-CM | POA: Diagnosis not present

## 2014-10-17 DIAGNOSIS — K760 Fatty (change of) liver, not elsewhere classified: Secondary | ICD-10-CM | POA: Diagnosis not present

## 2014-10-17 DIAGNOSIS — L97521 Non-pressure chronic ulcer of other part of left foot limited to breakdown of skin: Secondary | ICD-10-CM | POA: Diagnosis not present

## 2014-10-17 DIAGNOSIS — I890 Lymphedema, not elsewhere classified: Secondary | ICD-10-CM | POA: Diagnosis not present

## 2014-10-17 DIAGNOSIS — E1142 Type 2 diabetes mellitus with diabetic polyneuropathy: Secondary | ICD-10-CM | POA: Diagnosis not present

## 2014-10-17 LAB — HEPATIC FUNCTION PANEL
ALK PHOS: 80 U/L (ref 39–117)
ALT: 15 U/L (ref 0–35)
AST: 12 U/L (ref 0–37)
Albumin: 3.8 g/dL (ref 3.5–5.2)
BILIRUBIN DIRECT: 0.1 mg/dL (ref 0.0–0.3)
Total Bilirubin: 0.4 mg/dL (ref 0.2–1.2)
Total Protein: 6.5 g/dL (ref 6.0–8.3)

## 2014-10-17 LAB — GLUCOSE, CAPILLARY
GLUCOSE-CAPILLARY: 158 mg/dL — AB (ref 65–99)
GLUCOSE-CAPILLARY: 151 mg/dL — AB (ref 65–99)
Glucose-Capillary: 104 mg/dL — ABNORMAL HIGH (ref 65–99)
Glucose-Capillary: 118 mg/dL — ABNORMAL HIGH (ref 65–99)

## 2014-10-17 LAB — HEMOGLOBIN A1C: Hgb A1c MFr Bld: 6 % (ref 4.6–6.5)

## 2014-10-17 LAB — LDL CHOLESTEROL, DIRECT: LDL DIRECT: 74 mg/dL

## 2014-10-17 NOTE — Progress Notes (Signed)
[name], MD  Subjective:  [name] is a 74 y.o. year old very pleasant female patient who presents with:  Patient presents for evaluation for medical maximization of surgery for knee replacement on 12/30/14. See problem oriented charting for assessment of pertinent medical problems.  -Of note, Nuclear stress test 12/27/2007 for unknown reason. Normal nuclear stress test. No chest pain or shortness of breath walking up inclined hill or up stairs.  ROS- no chest pain or shortness of breath as above, no fever, chills, nausea, vomiting, unintentional weigh tloss.   Past Medical History- depression, IBS, osteoporosis, restless legs, history breast cancer  Medications- reviewed and updated Current Outpatient Prescriptions  Medication Sig Dispense Refill  . atenolol (TENORMIN) 25 MG tablet TAKE ONE TABLET BY MOUTH ONCE DAILY 90 tablet 3  . co-enzyme Q-10 50 MG capsule Take 150 mg by mouth daily.    . cyanocobalamin (,VITAMIN B-12,) 1000 MCG/ML injection Inject 1 mL (1,000 mcg total) into the muscle every 30 (thirty) days. 1 mL 11  . estradiol (ESTRING) 2 MG vaginal ring 1 ring vaginally every four months 1 each 4  . Flaxseed, Linseed, (FLAX SEED OIL) 1000 MG CAPS Take 1 capsule by mouth daily.     [name] FLUoxetine (PROZAC) 40 MG capsule Take 1 capsule (40 mg total) by mouth daily. 90 capsule 3  . glimepiride (AMARYL) 4 MG tablet Take 1 tablet (4 mg total) by mouth daily with breakfast. 90 tablet 3  . glucose blood (FREESTYLE LITE) test strip USE TO CHECK BLOOD SUGAR DAILY AND PRN 100 each 12  . Grape Seed Extract 100 MG CAPS Take 150 mg by mouth daily.     . Lactobacillus (ACIDOPHILUS PROBIOTIC) 100 MG CAPS Take 1 capsule (100 mg total) by mouth daily at 6 (six) AM. 30 capsule 11  . Lancets (FREESTYLE) lancets AS DIRECTED 100 each 0  . levofloxacin (LEVAQUIN) 500 MG tablet Take 500 mg by mouth daily.    [name] lisinopril (PRINIVIL,ZESTRIL) 10 MG tablet Take 1 tablet (10 mg total) by mouth  daily. 90 tablet 3  . LUMIGAN 0.01 % SOLN Place 1 drop into both eyes at bedtime.     . meloxicam (MOBIC) 15 MG tablet Take 1 tablet (15 mg total) by mouth daily. 90 tablet 3  . Methylcellulose, Laxative, (CITRUCEL) 500 MG TABS Take 2 tablets by mouth daily after supper.     . Multiple Vitamins-Minerals (CENTRUM SILVER PO) Take 1 tablet by mouth daily.     . mupirocin ointment (BACTROBAN) 2 % Apply 1 application topically daily as needed. For rash    . mupirocin ointment (BACTROBAN) 2 % PLACE ONE APPLICATION INTO THE NOSE 2 TIMES DAILY 15 g 0  . Omega-3 Fatty Acids (FISH OIL) 1200 MG CAPS Take 1 capsule by mouth 2 (two) times daily.    . pramipexole (MIRAPEX) 0.5 MG tablet TAKE ONE TABLET BY MOUTH AT BEDTIME 90 tablet 3  . Selenium 200 MCG CAPS Take 200 mcg by mouth daily.    [name] VICTOZA 18 MG/3ML SOPN INJECT 1.8 MG INTO THE SKIN DAILY 9 pen 3  . Vitamin Mixture (ESTER-C) 500-60 MG TABS Take 1 tablet by mouth daily.    . dorzolamide-timolol (COSOPT) 22.3-6.8 MG/ML ophthalmic solution Place 1 drop into both eyes 2 (two) times daily.     . temazepam (RESTORIL) 30 MG capsule Take 1 capsule (30 mg total) by mouth at bedtime as needed for sleep. (Patient not taking: Reported on 10/17/2014) 30 capsule 5  .  traMADol (ULTRAM) 50 MG tablet Take 0.5-1 tablets (25-50 mg total) by mouth every 8 (eight) hours as needed. (Patient not taking: Reported on 10/17/2014) 60 tablet 1   Objective: BP 106/64 mmHg  Pulse 98  Temp(Src) 98.7 F (37.1 C)  Wt 245 lb (111.131 kg)  LMP 05/10/1992 Gen: NAD, resting comfortably CV: RRR no murmurs rubs or gallops Lungs: CTAB no crackles, wheeze, rhonchi Abdomen: soft/nontender/nondistended/normal bowel sounds. Obese.   Ext: no edema, healing ulcers on bottom of foot- completely closed Skin: warm, dry, no rash other than healing ulcers Neuro: grossly normal, moves all extremities   Assessment/Plan:  WIll Route this note to Dr. Sedalia Muta orthopedics. In regards to  medical maximization for surgery, please read H+P above as well as commentary below on each pertinent medical problem. Patient is able to complete 4 METS without symptoms as well. Do not believe needs further cardiac clearance/EKG/CXR. Patient is medically maximized for surgery.   Diabetes mellitus type II, controlled S: controlled on amaryl 4mg  and victoza with a1c of 6 today.  A/P: very well controlled, honestly could even consider cutting amaryl to 2mg , especially if had hypoglycemia (has not)   Diabetic ulcer of left foot associated with type 2 diabetes mellitus S: healing wounds on 1st and 3rd toes. Finished prolonged course of antibiotics for osteomyelitis. Has 2 sessions hyperbaric left. levaquin was apparently causing arthralgias as drastically improved once patient stopped taking.  A/P: Drastic improvement and will be well healed before surgical date. Safe to proceed.    ANEMIA, B12 DEFICIENCY S: Anemia resolved on b12 injections A/P: continue once a month b12 injections   Essential hypertension, benign S:controlled on Atenolol 25mg , lisinopril 10mg  A/P: continue current rx   Fatty liver LFTs normalized-needs weight loss. Surgery should help patient achieve this in long run due to increased mobility  Hyperlipidemia S: Diet/exercise controlled in past but last LDL near 110 A/P: repeat LDL today <80. Maximized for surgery without statin.     Return precautions advised.   Orders Placed This Encounter  Procedures  . LDL cholesterol, direct    [place]  . Hemoglobin A1c    [place]  . Hepatic Function Panel    No orders of the defined types were placed in this encounter.

## 2014-10-17 NOTE — Assessment & Plan Note (Signed)
S: Diet/exercise controlled in past but last LDL near 110 A/P: repeat LDL today <80. Maximized for surgery without statin.

## 2014-10-17 NOTE — Patient Instructions (Signed)
We will write a letter that you are medically maximized for surgery to Dr. Maureen Ralphs  Thrilled you are feeling better off the levaquin.   Ok to use the tramadol as needed for the knee pain until your surgery.

## 2014-10-17 NOTE — Assessment & Plan Note (Signed)
S: Anemia resolved on b12 injections A/P: continue once a month b12 injections

## 2014-10-17 NOTE — Assessment & Plan Note (Signed)
S:controlled on Atenolol 25mg , lisinopril 10mg  A/P: continue current rx

## 2014-10-17 NOTE — Assessment & Plan Note (Addendum)
S: healing wounds on 1st and 3rd toes. Finished prolonged course of antibiotics for osteomyelitis. Has 2 sessions hyperbaric left. levaquin was apparently causing arthralgias as drastically improved once patient stopped taking.  A/P: Drastic improvement and will be well healed before surgical date. Safe to proceed.

## 2014-10-17 NOTE — Assessment & Plan Note (Signed)
LFTs normalized-needs weight loss. Surgery should help patient achieve this in long run due to increased mobility

## 2014-10-17 NOTE — Assessment & Plan Note (Signed)
S: controlled on amaryl 4mg  and victoza with a1c of 6 today.  A/P: very well controlled, honestly could even consider cutting amaryl to 2mg , especially if had hypoglycemia (has not)

## 2014-10-18 DIAGNOSIS — I10 Essential (primary) hypertension: Secondary | ICD-10-CM | POA: Diagnosis not present

## 2014-10-18 DIAGNOSIS — E11621 Type 2 diabetes mellitus with foot ulcer: Secondary | ICD-10-CM | POA: Diagnosis not present

## 2014-10-18 DIAGNOSIS — I890 Lymphedema, not elsewhere classified: Secondary | ICD-10-CM | POA: Diagnosis not present

## 2014-10-18 DIAGNOSIS — H409 Unspecified glaucoma: Secondary | ICD-10-CM | POA: Diagnosis not present

## 2014-10-18 DIAGNOSIS — E1142 Type 2 diabetes mellitus with diabetic polyneuropathy: Secondary | ICD-10-CM | POA: Diagnosis not present

## 2014-10-18 DIAGNOSIS — L97521 Non-pressure chronic ulcer of other part of left foot limited to breakdown of skin: Secondary | ICD-10-CM | POA: Diagnosis not present

## 2014-10-18 NOTE — Progress Notes (Signed)
Note faxed to Dr. Synthia Innocent

## 2014-10-19 DIAGNOSIS — D518 Other vitamin B12 deficiency anemias: Secondary | ICD-10-CM | POA: Diagnosis not present

## 2014-10-21 DIAGNOSIS — E11621 Type 2 diabetes mellitus with foot ulcer: Secondary | ICD-10-CM | POA: Diagnosis not present

## 2014-10-21 DIAGNOSIS — I10 Essential (primary) hypertension: Secondary | ICD-10-CM | POA: Diagnosis not present

## 2014-10-21 DIAGNOSIS — L97521 Non-pressure chronic ulcer of other part of left foot limited to breakdown of skin: Secondary | ICD-10-CM | POA: Diagnosis not present

## 2014-10-21 DIAGNOSIS — H409 Unspecified glaucoma: Secondary | ICD-10-CM | POA: Diagnosis not present

## 2014-10-21 DIAGNOSIS — E1142 Type 2 diabetes mellitus with diabetic polyneuropathy: Secondary | ICD-10-CM | POA: Diagnosis not present

## 2014-10-21 DIAGNOSIS — I890 Lymphedema, not elsewhere classified: Secondary | ICD-10-CM | POA: Diagnosis not present

## 2014-10-21 LAB — GLUCOSE, CAPILLARY
GLUCOSE-CAPILLARY: 113 mg/dL — AB (ref 65–99)
GLUCOSE-CAPILLARY: 211 mg/dL — AB (ref 65–99)
Glucose-Capillary: 133 mg/dL — ABNORMAL HIGH (ref 65–99)
Glucose-Capillary: 172 mg/dL — ABNORMAL HIGH (ref 65–99)

## 2014-10-25 DIAGNOSIS — L309 Dermatitis, unspecified: Secondary | ICD-10-CM | POA: Diagnosis not present

## 2014-11-16 DIAGNOSIS — D518 Other vitamin B12 deficiency anemias: Secondary | ICD-10-CM | POA: Diagnosis not present

## 2014-11-19 ENCOUNTER — Other Ambulatory Visit: Payer: Self-pay | Admitting: Family Medicine

## 2014-11-19 DIAGNOSIS — E042 Nontoxic multinodular goiter: Secondary | ICD-10-CM | POA: Diagnosis not present

## 2014-11-20 ENCOUNTER — Other Ambulatory Visit: Payer: Self-pay | Admitting: *Deleted

## 2014-11-21 DIAGNOSIS — R5382 Chronic fatigue, unspecified: Secondary | ICD-10-CM | POA: Diagnosis not present

## 2014-11-21 DIAGNOSIS — E042 Nontoxic multinodular goiter: Secondary | ICD-10-CM | POA: Diagnosis not present

## 2014-11-27 DIAGNOSIS — L309 Dermatitis, unspecified: Secondary | ICD-10-CM | POA: Diagnosis not present

## 2014-11-30 ENCOUNTER — Encounter: Payer: Self-pay | Admitting: Family Medicine

## 2014-12-04 DIAGNOSIS — M1711 Unilateral primary osteoarthritis, right knee: Secondary | ICD-10-CM | POA: Diagnosis not present

## 2014-12-05 ENCOUNTER — Telehealth: Payer: Self-pay | Admitting: *Deleted

## 2014-12-05 NOTE — Telephone Encounter (Signed)
Patient called to reschedule her appointment from 12/17/14 with Dr. Sabra Heck due to her having a knee replacement on 12/30/14 and per her physician she needs to have her estring removed 3 days before surgery. Patient said she was okay to see Ms. Patty, scheduled her for 12/27/14 at 10:15.  Routed to Ms. Patty CC: Dr. Sabra Heck

## 2014-12-06 ENCOUNTER — Ambulatory Visit: Payer: Self-pay | Admitting: Orthopedic Surgery

## 2014-12-06 NOTE — H&P (Signed)
Midge Minium. Hanover DOB: [birth year] Married / Language: English / Race: White Female Date of Admission:  12/30/2014 CC:  Right Knee Pain History of Present Illness The patient is a 74 year old female who comes in for a preoperative History and Physical. The patient is scheduled for a right total knee arthroplasty to be performed by Dr. Dione Plover. Aluisio, MD at [facility] on 12/30/2014. The patient is a 74 year old female who presentedfor follow up of their knee. The patient is being followed for their right knee pain and Baker's Cyst. They have undegone several gel injection series. Symptoms reported include: pain and difficulty ambulating. The patient feels that they are doing poorly and report their pain level to be moderate. The patient has reported only temporary improvement of their symptoms with: viscosupplementation. Trenia comes in for recheck of her left and right knees and Baker cyst on the left. This round of viscosupplementation only lasted about 3-4 weeks and has worn off. She had her Baker cyst aspirated several times by Dr. Delilah Shan and had it injected with some breif relief. She states that the aspiration helped, but she did not feel that the cortisone injection really helped much. She has now reached a point where she would like to get something more permanant done about her knee. They have been treated conservatively in the past for the above stated problem and despite conservative measures, they continue to have progressive pain and severe functional limitations and dysfunction. They have failed non-operative management including home exercise, medications, and injections. It is felt that they would benefit from undergoing total joint replacement. Risks and benefits of the procedure have been discussed with the patient and they elect to proceed with surgery. There are no active contraindications to surgery such as ongoing infection or rapidly progressive neurological  disease.  Problem List/Past Medical Hammer toe (M20.40) Foot infection (L08.9) Flat foot (M21.40) Posterior tibial tendinitis (I43.329) Primary osteoarthritis of left knee (M17.12) Synovial cyst of popliteal space, left (M71.22) Fracture, humerus, head (J18.841Y) Hyperthyroidism Migraine Headache Peripheral Neuropathy Osteoarthritis High blood pressure Diabetes Mellitus Hypertension Autoimmune disorder mother Breast Cancer Shingles Depression Glaucoma Cataracts History of Bronchitis History of Pneumonia Hemorrhoids Irritable Bowel Syndrome Diverticulosis History of Cystitis Measles Mumps Rubella Eczema  Allergies Macrodantin *URINARY ANTI-INFECTIVES* Keflex *CEPHALOSPORINS* Erythromycin *MACROLIDES Percocet - Itching Levaquin - Tendon Pain  Family History Chronic Obstructive Lung Disease father Heart Disease father Osteoarthritis father Osteoporosis mother Rheumatoid Arthritis Mother. mother  Social History No alcohol use Tobacco use Never smoker. never smoker Alcohol use never consumed alcohol Children 1 Current work status retired Engineer, agricultural (Currently) no Drug/Alcohol Rehab (Previously) no Exercise Exercises rarely Illicit drug use no Living situation live with spouse Marital status married Number of flights of stairs before winded greater than 5 Pain Contract no Living Will and Healthcare POA  Medication History  Acidophilus Aspirin Atenolol Bimatoprost Ophthalmic Centrum Silver Citrucel Co-Q-10 Vit B-12 injection Dorzolamide-timolol opthalmic Ester-C Estradiol Ring Fish Oil Flax Seed Oil Fluoxetine Glimepiride Grape Seed Extract Meloxicam Pramipexole Selenium Temazepam Victoza  Past Surgical History  Mastectomy - Both Gallbladder Surgery laporoscopic Cholecystectomy Dilation and Curettage of Uterus Ovary Removal - Both Tonsillectomy Mastectomy - Bilateral  bilateral Thyroidectomy; Total Hysterectomy partial (non-cancerous)  Review of Systems  Constitutional: Negative.   HENT: Negative.   Respiratory: Negative.   Cardiovascular: Negative.   Gastrointestinal: Positive for diarrhea.  Genitourinary: Negative.   Musculoskeletal: Positive for joint pain.  Psychiatric/Behavioral: The patient has insomnia.    Vitals  12/05/2014  3:38 PM Weight: 245 lb Height: 72in Weight was reported by patient. Height was reported by patient. Body Surface Area: 2.32 m Body Mass Index: 33.23 kg/m  BP: 132/72 (Sitting, Right Arm, Standard)  Physical Exam  General Mental Status -Alert, cooperative and good historian. General Appearance-pleasant, Not in acute distress. Orientation-Oriented X3. Build & Nutrition-Well nourished and Well developed.  Head and Neck Head-normocephalic, atraumatic . Neck Global Assessment - supple, no bruit auscultated on the right, no bruit auscultated on the left.  Eye Pupil - Bilateral-Regular and Round. Motion - Bilateral-EOMI.  Chest and Lung Exam Auscultation Breath sounds - clear at anterior chest wall and clear at posterior chest wall. Adventitious sounds - No Adventitious sounds.  Cardiovascular Auscultation Rhythm - Regular rate and rhythm. Heart Sounds - S1 WNL and S2 WNL. Murmurs & Other Heart Sounds - Auscultation of the heart reveals - No Murmurs.  Abdomen Palpation/Percussion Tenderness - Abdomen is non-tender to palpation. Rigidity (guarding) - Abdomen is soft. Auscultation Auscultation of the abdomen reveals - Bowel sounds normal.  Female Genitourinary Note: Not done, not pertinent to present illness  Musculoskeletal Note: Right Knee: Inspection and Palpation - Tenderness - lateral knee tender to palpation, no tenderness to palpation of the medial knee, no tenderness to palpation of the posterior knee. Swelling - mild. ROM: Flexion - AROM - 115 . Extension - AROM - -10  . Gait and Station: Evaluation of Gait - Gait Pattern - antalgic.  Assessment & Plan Primary osteoarthritis of right knee (M17.11) Note:Surgical Plans: Right Total Knee Replacement  Disposition: Home  PCP: Dr. Yong Channel - Patient has been seen preop and felt to be stable for dsurgery  Topical TXA - History of Breast Cancer  Arlee Muslim, PA-C

## 2014-12-15 DIAGNOSIS — D518 Other vitamin B12 deficiency anemias: Secondary | ICD-10-CM | POA: Diagnosis not present

## 2014-12-17 ENCOUNTER — Ambulatory Visit: Payer: Self-pay | Admitting: Orthopedic Surgery

## 2014-12-17 ENCOUNTER — Ambulatory Visit: Payer: Medicare Other | Admitting: Obstetrics & Gynecology

## 2014-12-17 DIAGNOSIS — M7122 Synovial cyst of popliteal space [Baker], left knee: Secondary | ICD-10-CM | POA: Diagnosis not present

## 2014-12-17 NOTE — Progress Notes (Signed)
Preoperative surgical orders have been place into the Epic hospital system for [name] on 12/17/2014, 12:24 PM  by Mickel Crow for surgery on 12/30/2014.  Preop Total Knee orders including Experal, IV Tylenol, and IV Decadron as long as there are no contraindications to the above medications. Arlee Muslim, PA-C

## 2014-12-20 NOTE — Patient Instructions (Addendum)
YOUR PROCEDURE IS SCHEDULED ON : 12/30/14  REPORT TO [facility] MAIN ENTRANCE FOLLOW SIGNS TO EAST ELEVATOR - GO TO 3rd FLOOR CHECK IN AT 3 EAST NURSES STATION (SHORT STAY) AT:  9:40 AM  CALL THIS NUMBER IF YOU HAVE PROBLEMS THE MORNING OF SURGERY [phone]  REMEMBER:ONLY 1 PER PERSON MAY GO TO SHORT STAY WITH YOU TO GET READY THE MORNING OF YOUR SURGERY  DO NOT EAT FOOD OR DRINK LIQUIDS AFTER MIDNIGHT  TAKE THESE MEDICINES THE MORNING OF SURGERY:  FLUOXETINE / ATENOLOL  MAY HAVE WATER UNTIL 6:40 AM  YOU MAY NOT HAVE ANY METAL ON YOUR BODY INCLUDING HAIR PINS AND PIERCING'S. DO NOT WEAR JEWELRY, MAKEUP, LOTIONS, POWDERS OR PERFUMES. DO NOT WEAR NAIL POLISH. DO NOT SHAVE 48 HRS PRIOR TO SURGERY. MEN MAY SHAVE FACE AND NECK.  DO NOT [place]. [facility] IS NOT RESPONSIBLE FOR VALUABLES.  CONTACTS, DENTURES OR PARTIALS MAY NOT BE WORN TO SURGERY. LEAVE SUITCASE IN CAR. CAN BE BROUGHT TO ROOM AFTER SURGERY.  PATIENTS DISCHARGED THE DAY OF SURGERY WILL NOT BE ALLOWED TO DRIVE HOME.  PLEASE READ OVER THE FOLLOWING INSTRUCTION SHEETS _________________________________________________________________________________                                          [facility] - PREPARING FOR SURGERY  Before surgery, you can play an important role.  Because skin is not sterile, your skin needs to be as free of germs as possible.  You can reduce the number of germs on your skin by washing with CHG (chlorahexidine gluconate) soap before surgery.  CHG is an antiseptic cleaner which kills germs and bonds with the skin to continue killing germs even after washing. Please DO NOT use if you have an allergy to CHG or antibacterial soaps.  If your skin becomes reddened/irritated stop using the CHG and inform your nurse when you arrive at Short Stay. Do not shave (including legs and underarms) for at least 48 hours prior to the first CHG shower.  You may shave your  face. Please follow these instructions carefully:   1.  Shower with CHG Soap the night before surgery and the  morning of Surgery.   2.  If you choose to wash your hair, wash your hair first as usual with your  normal  Shampoo.   3.  After you shampoo, rinse your hair and body thoroughly to remove the  shampoo.                                         4.  Use CHG as you would any other liquid soap.  You can apply chg directly  to the skin and wash . Gently wash with scrungie or clean wascloth    5.  Apply the CHG Soap to your body ONLY FROM THE NECK DOWN.   Do not use on open                           Wound or open sores. Avoid contact with eyes, ears mouth and genitals (private parts).  Genitals (private parts) with your normal soap.              6.  Wash thoroughly, paying special attention to the area where your surgery  will be performed.   7.  Thoroughly rinse your body with warm water from the neck down.   8.  DO NOT shower/wash with your normal soap after using and rinsing off  the CHG Soap .                9.  Pat yourself dry with a clean towel.             10.  Wear clean night clothes to bed after shower             11.  Place clean sheets on your bed the night of your first shower and do not  sleep with pets.  Day of Surgery : Do not apply any lotions/deodorants the morning of surgery.  Please wear clean clothes to the hospital/surgery center.  FAILURE TO FOLLOW THESE INSTRUCTIONS MAY RESULT IN THE CANCELLATION OF YOUR SURGERY    PATIENT SIGNATURE_________________________________  ______________________________________________________________________     Adam Phenix  An incentive spirometer is a tool that can help keep your lungs clear and active. This tool measures how well you are filling your lungs with each breath. Taking long deep breaths may help reverse or decrease the chance of developing breathing (pulmonary) problems  (especially infection) following:  A long period of time when you are unable to move or be active. BEFORE THE PROCEDURE   If the spirometer includes an indicator to show your best effort, your nurse or respiratory therapist will set it to a desired goal.  If possible, sit up straight or lean slightly forward. Try not to slouch.  Hold the incentive spirometer in an upright position. INSTRUCTIONS FOR USE   Sit on the edge of your bed if possible, or sit up as far as you can in bed or on a chair.  Hold the incentive spirometer in an upright position.  Breathe out normally.  Place the mouthpiece in your mouth and seal your lips tightly around it.  Breathe in slowly and as deeply as possible, raising the piston or the ball toward the top of the column.  Hold your breath for 3-5 seconds or for as long as possible. Allow the piston or ball to fall to the bottom of the column.  Remove the mouthpiece from your mouth and breathe out normally.  Rest for a few seconds and repeat Steps 1 through 7 at least 10 times every 1-2 hours when you are awake. Take your time and take a few normal breaths between deep breaths.  The spirometer may include an indicator to show your best effort. Use the indicator as a goal to work toward during each repetition.  After each set of 10 deep breaths, practice coughing to be sure your lungs are clear. If you have an incision (the cut made at the time of surgery), support your incision when coughing by placing a pillow or rolled up towels firmly against it. Once you are able to get out of bed, walk around indoors and cough well. You may stop using the incentive spirometer when instructed by your caregiver.  RISKS AND COMPLICATIONS  Take your time so you do not get dizzy or light-headed.  If you are in pain, you may need to take or ask for pain medication before doing incentive spirometry. It is  harder to take a deep breath if you are having pain. AFTER  USE  Rest and breathe slowly and easily.  It can be helpful to keep track of a log of your progress. Your caregiver can provide you with a simple table to help with this. If you are using the spirometer at home, follow these instructions: [place] IF:   You are having difficultly using the spirometer.  You have trouble using the spirometer as often as instructed.  Your pain medication is not giving enough relief while using the spirometer.  You develop fever of 100.5 F (38.1 C) or higher. SEEK IMMEDIATE MEDICAL CARE IF:   You cough up bloody sputum that had not been present before.  You develop fever of 102 F (38.9 C) or greater.  You develop worsening pain at or near the incision site. MAKE SURE YOU:   Understand these instructions.  Will watch your condition.  Will get help right away if you are not doing well or get worse. Document Released: 09/06/2006 Document Revised: 07/19/2011 Document Reviewed: 11/07/2006 ExitCare Patient Information 2014 ExitCare, [place].   ________________________________________________________________________  WHAT IS A BLOOD TRANSFUSION? Blood Transfusion Information  A transfusion is the replacement of blood or some of its parts. Blood is made up of multiple cells which provide different functions.  Red blood cells carry oxygen and are used for blood loss replacement.  White blood cells fight against infection.  Platelets control bleeding.  Plasma helps clot blood.  Other blood products are available for specialized needs, such as hemophilia or other clotting disorders. BEFORE THE TRANSFUSION  Who gives blood for transfusions?   Healthy volunteers who are fully evaluated to make sure their blood is safe. This is blood bank blood. Transfusion therapy is the safest it has ever been in the practice of medicine. Before blood is taken from a donor, a complete history is taken to make sure that person has no history of diseases  nor engages in risky social behavior (examples are intravenous drug use or sexual activity with multiple partners). The donor's travel history is screened to minimize risk of transmitting infections, such as malaria. The donated blood is tested for signs of infectious diseases, such as HIV and hepatitis. The blood is then tested to be sure it is compatible with you in order to minimize the chance of a transfusion reaction. If you or a relative donates blood, this is often done in anticipation of surgery and is not appropriate for emergency situations. It takes many days to process the donated blood. RISKS AND COMPLICATIONS Although transfusion therapy is very safe and saves many lives, the main dangers of transfusion include:   Getting an infectious disease.  Developing a transfusion reaction. This is an allergic reaction to something in the blood you were given. Every precaution is taken to prevent this. The decision to have a blood transfusion has been considered carefully by your caregiver before blood is given. Blood is not given unless the benefits outweigh the risks. AFTER THE TRANSFUSION  Right after receiving a blood transfusion, you will usually feel much better and more energetic. This is especially true if your red blood cells have gotten low (anemic). The transfusion raises the level of the red blood cells which carry oxygen, and this usually causes an energy increase.  The nurse administering the transfusion will monitor you carefully for complications. HOME CARE INSTRUCTIONS  No special instructions are needed after a transfusion. You may find your energy is better. Speak  with your caregiver about any limitations on activity for underlying diseases you may have. SEEK MEDICAL CARE IF:   Your condition is not improving after your transfusion.  You develop redness or irritation at the intravenous (IV) site. SEEK IMMEDIATE MEDICAL CARE IF:  Any of the following symptoms occur over the  next 12 hours:  Shaking chills.  You have a temperature by mouth above 102 F (38.9 C), not controlled by medicine.  Chest, back, or muscle pain.  People around you feel you are not acting correctly or are confused.  Shortness of breath or difficulty breathing.  Dizziness and fainting.  You get a rash or develop hives.  You have a decrease in urine output.  Your urine turns a dark color or changes to pink, red, or [place]. Any of the following symptoms occur over the next 10 days:  You have a temperature by mouth above 102 F (38.9 C), not controlled by medicine.  Shortness of breath.  Weakness after normal activity.  The white part of the eye turns yellow (jaundice).  You have a decrease in the amount of urine or are urinating less often.  Your urine turns a dark color or changes to pink, red, or [place]. Document Released: 04/23/2000 Document Revised: 07/19/2011 Document Reviewed: 12/11/2007 [facility] Patient Information 2014 [place], [place].  _______________________________________________________________________

## 2014-12-23 ENCOUNTER — Encounter: Payer: Self-pay | Admitting: Family Medicine

## 2014-12-24 ENCOUNTER — Encounter (HOSPITAL_COMMUNITY): Payer: Self-pay

## 2014-12-24 ENCOUNTER — Encounter (HOSPITAL_COMMUNITY)
Admission: RE | Admit: 2014-12-24 | Discharge: 2014-12-24 | Disposition: A | Discharge status: Home / Self Care | Payer: Medicare Other | Source: Ambulatory Visit | Attending: Orthopedic Surgery | Admitting: Orthopedic Surgery

## 2014-12-24 DIAGNOSIS — Z01818 Encounter for other preprocedural examination: Secondary | ICD-10-CM | POA: Insufficient documentation

## 2014-12-24 DIAGNOSIS — M179 Osteoarthritis of knee, unspecified: Secondary | ICD-10-CM | POA: Diagnosis not present

## 2014-12-24 DIAGNOSIS — Z7901 Long term (current) use of anticoagulants: Secondary | ICD-10-CM | POA: Insufficient documentation

## 2014-12-24 HISTORY — DX: Osteomyelitis, unspecified: M86.9

## 2014-12-24 HISTORY — DX: Dermatitis, unspecified: L30.9

## 2014-12-24 HISTORY — DX: Posterior tibial tendinitis, unspecified leg: M76.829

## 2014-12-24 HISTORY — DX: Depression, unspecified: F32.A

## 2014-12-24 HISTORY — DX: Major depressive disorder, single episode, unspecified: F32.9

## 2014-12-24 HISTORY — DX: Sleep disorder, unspecified: G47.9

## 2014-12-24 HISTORY — DX: Unspecified open wound of left lesser toe(s) without damage to nail, initial encounter: S91.105A

## 2014-12-24 HISTORY — DX: Other specified disorders of bone density and structure, unspecified site: M85.80

## 2014-12-24 LAB — URINALYSIS, ROUTINE W REFLEX MICROSCOPIC
BILIRUBIN URINE: NEGATIVE
Glucose, UA: NEGATIVE mg/dL
Hgb urine dipstick: NEGATIVE
Ketones, ur: NEGATIVE mg/dL
LEUKOCYTES UA: NEGATIVE
NITRITE: NEGATIVE
Protein, ur: NEGATIVE mg/dL
SPECIFIC GRAVITY, URINE: 1.015 (ref 1.005–1.030)
UROBILINOGEN UA: 1 mg/dL (ref 0.0–1.0)
pH: 7.5 (ref 5.0–8.0)

## 2014-12-24 LAB — SURGICAL PCR SCREEN
MRSA, PCR: NEGATIVE
Staphylococcus aureus: NEGATIVE

## 2014-12-24 LAB — CBC
HEMATOCRIT: 40.7 % (ref 36.0–46.0)
HEMOGLOBIN: 13.2 g/dL (ref 12.0–15.0)
MCH: 30.4 pg (ref 26.0–34.0)
MCHC: 32.4 g/dL (ref 30.0–36.0)
MCV: 93.8 fL (ref 78.0–100.0)
Platelets: 262 10*3/uL (ref 150–400)
RBC: 4.34 MIL/uL (ref 3.87–5.11)
RDW: 14.2 % (ref 11.5–15.5)
WBC: 9.4 10*3/uL (ref 4.0–10.5)

## 2014-12-24 LAB — COMPREHENSIVE METABOLIC PANEL
ALBUMIN: 3.8 g/dL (ref 3.5–5.0)
ALK PHOS: 71 U/L (ref 38–126)
ALT: 26 U/L (ref 14–54)
AST: 24 U/L (ref 15–41)
Anion gap: 7 (ref 5–15)
BILIRUBIN TOTAL: 0.6 mg/dL (ref 0.3–1.2)
BUN: 21 mg/dL — AB (ref 6–20)
CALCIUM: 9.2 mg/dL (ref 8.9–10.3)
CO2: 28 mmol/L (ref 22–32)
CREATININE: 0.49 mg/dL (ref 0.44–1.00)
Chloride: 104 mmol/L (ref 101–111)
GFR calc Af Amer: >60 mL/min (ref >60)
GLUCOSE: 119 mg/dL — AB (ref 65–99)
POTASSIUM: 4.2 mmol/L (ref 3.5–5.1)
Sodium: 139 mmol/L (ref 135–145)
TOTAL PROTEIN: 7.2 g/dL (ref 6.5–8.1)

## 2014-12-24 LAB — APTT: APTT: 26 s (ref 24–37)

## 2014-12-24 LAB — PROTIME-INR
INR: 0.99 (ref 0.00–1.49)
Prothrombin Time: 13.3 seconds (ref 11.6–15.2)

## 2014-12-24 LAB — ABO/RH: ABO/RH(D): O POS

## 2014-12-24 NOTE — Progress Notes (Signed)
Spoke with [name] concerning the pt's left 2nd toe. Pt has been to wound clinic and had hyperbaric treatments.Last visit at wound clinic 10/21/14. Toe is slightly pink with pin size opening at tip and pt states there is drainage from wound. [name] said pt needs to go to wound clinic appt Friday 12/27/14 for evaluation and must be cleared from their standpoint or surgery will need to be canceled. Pt was informed and will go to clinic appt as scheduled.

## 2014-12-24 NOTE — Progress Notes (Signed)
EKG reviewed by Dr.Ewell - no action needed 

## 2014-12-24 NOTE — Progress Notes (Signed)
   12/24/14 1437  OBSTRUCTIVE SLEEP APNEA  Have you ever been diagnosed with sleep apnea through a sleep study? No (had sleep study done 10 yrs ago - no sleep apnea per pt)  Do you snore loudly (loud enough to be heard through closed doors)?  1  Do you often feel tired, fatigued, or sleepy during the daytime? 1  Has anyone observed you stop breathing during your sleep? 0  Do you have, or are you being treated for high blood pressure? 1  BMI more than 35 kg/m2? 0  Age over 60 years old? 1  Neck circumference greater than 40 cm/16 inches? 1  Gender: 0

## 2014-12-25 ENCOUNTER — Encounter (HOSPITAL_COMMUNITY): Payer: Self-pay

## 2014-12-25 ENCOUNTER — Ambulatory Visit (INDEPENDENT_AMBULATORY_CARE_PROVIDER_SITE_OTHER): Payer: Medicare Other | Admitting: Family Medicine

## 2014-12-25 ENCOUNTER — Encounter: Payer: Self-pay | Admitting: Family Medicine

## 2014-12-25 ENCOUNTER — Ambulatory Visit: Payer: Self-pay | Admitting: Family Medicine

## 2014-12-25 VITALS — BP 112/70 | HR 88 | Temp 98.8°F | Wt 245.0 lb

## 2014-12-25 DIAGNOSIS — L97529 Non-pressure chronic ulcer of other part of left foot with unspecified severity: Secondary | ICD-10-CM

## 2014-12-25 DIAGNOSIS — D2272 Melanocytic nevi of left lower limb, including hip: Secondary | ICD-10-CM | POA: Diagnosis not present

## 2014-12-25 DIAGNOSIS — L720 Epidermal cyst: Secondary | ICD-10-CM | POA: Diagnosis not present

## 2014-12-25 DIAGNOSIS — D225 Melanocytic nevi of trunk: Secondary | ICD-10-CM | POA: Diagnosis not present

## 2014-12-25 DIAGNOSIS — L820 Inflamed seborrheic keratosis: Secondary | ICD-10-CM | POA: Diagnosis not present

## 2014-12-25 DIAGNOSIS — L309 Dermatitis, unspecified: Secondary | ICD-10-CM | POA: Diagnosis not present

## 2014-12-25 DIAGNOSIS — L821 Other seborrheic keratosis: Secondary | ICD-10-CM | POA: Diagnosis not present

## 2014-12-25 DIAGNOSIS — L814 Other melanin hyperpigmentation: Secondary | ICD-10-CM | POA: Diagnosis not present

## 2014-12-25 DIAGNOSIS — E11621 Type 2 diabetes mellitus with foot ulcer: Secondary | ICD-10-CM

## 2014-12-25 DIAGNOSIS — D1801 Hemangioma of skin and subcutaneous tissue: Secondary | ICD-10-CM | POA: Diagnosis not present

## 2014-12-25 NOTE — Patient Instructions (Signed)
Need MRI first then plan from there. We will be in touch after results

## 2014-12-25 NOTE — Progress Notes (Signed)
[name], MD  Subjective:  [name] is a 74 y.o. year old very pleasant female patient who presents with:  Diabetic Ulcer of left foot with concern for osteomyelitis -R knee surgery planned 12/30/14. Woke up 2 nights ago with pain in left 3rd toe/middle toe. History of osteomyelitis 3rd toe left foot treated with prolonged antibiotic course and hyperbaric treatment through wound care center. Has been told cannot have knee replacement without infection being healed first. Was to return to wound care on Friday but would not give enough time to know if infected. Presentst to me for my opinion.   ROS- no worsening pain or redness, no expansion of wound- about 2-3 mm opening, no fever, chills, nausea, vomiting  Past Medical History- Diabetes controlled, restless legs, IBS, HLD, fatty liver, HTN, depression, b12 deficiency  Medications- reviewed and updated Current Outpatient Prescriptions  Medication Sig Dispense Refill  . atenolol (TENORMIN) 25 MG tablet TAKE ONE TABLET BY MOUTH ONCE DAILY 90 tablet 3  . betamethasone dipropionate (DIPROLENE) 0.05 % cream Apply 1 application topically 2 (two) times daily as needed.    [name] co-enzyme Q-10 50 MG capsule Take 150 mg by mouth daily.    . cyanocobalamin (,VITAMIN B-12,) 1000 MCG/ML injection Inject 1 mL (1,000 mcg total) into the muscle every 30 (thirty) days. 1 mL 11  . dorzolamide-timolol (COSOPT) 22.3-6.8 MG/ML ophthalmic solution Place 1 drop into both eyes 2 (two) times daily.     [name] estradiol (ESTRING) 2 MG vaginal ring 1 ring vaginally every four months 1 each 4  . Flaxseed, Linseed, (FLAX SEED OIL) 1000 MG CAPS Take 1 capsule by mouth daily.     [name] FLUoxetine (PROZAC) 40 MG capsule Take 1 capsule (40 mg total) by mouth daily. 90 capsule 3  . glimepiride (AMARYL) 4 MG tablet Take 1 tablet (4 mg total) by mouth daily with breakfast. 90 tablet 3  . Grape Seed Extract 100 MG CAPS Take 150 mg by mouth daily.     . Lactobacillus  (ACIDOPHILUS PROBIOTIC) 100 MG CAPS Take 1 capsule (100 mg total) by mouth daily at 6 (six) AM. 30 capsule 11  . lisinopril (PRINIVIL,ZESTRIL) 10 MG tablet Take 1 tablet (10 mg total) by mouth daily. 90 tablet 3  . LUMIGAN 0.01 % SOLN Place 1 drop into both eyes at bedtime.     . meloxicam (MOBIC) 15 MG tablet Take 1 tablet (15 mg total) by mouth daily. 90 tablet 3  . Methylcellulose, Laxative, (CITRUCEL) 500 MG TABS Take 2 tablets by mouth daily after supper.     . Multiple Vitamins-Minerals (CENTRUM SILVER PO) Take 1 tablet by mouth daily.     . mupirocin ointment (BACTROBAN) 2 % PLACE ONE APPLICATION INTO THE NOSE 2 TIMES DAILY (Patient not taking: Reported on 12/18/2014) 15 g 0  . Omega-3 Fatty Acids (FISH OIL) 1200 MG CAPS Take 1 capsule by mouth 2 (two) times daily.    . pramipexole (MIRAPEX) 0.5 MG tablet TAKE ONE TABLET BY MOUTH AT BEDTIME 90 tablet 3  . Selenium 200 MCG CAPS Take 200 mcg by mouth daily.    . temazepam (RESTORIL) 30 MG capsule Take 1 capsule (30 mg total) by mouth at bedtime as needed for sleep. 30 capsule 5  . traMADol (ULTRAM) 50 MG tablet Take 0.5-1 tablets (25-50 mg total) by mouth every 8 (eight) hours as needed. (Patient not taking: Reported on 10/17/2014) 60 tablet 1  . VICTOZA 18 MG/3ML SOPN INJECT 1.8MG  INTO THE  SKIN DAILY 9 pen 1  . Vitamin Mixture (ESTER-C) 500-60 MG TABS Take 1 tablet by mouth daily.     No current facility-administered medications for this visit.    Objective: BP 112/70 mmHg  Pulse 88  Temp(Src) 98.8 F (37.1 C)  Wt 245 lb (111.131 kg)  LMP 05/10/1992 Gen: NAD, resting comfortably CV: RRR no murmurs rubs or gallops Lungs: CTAB no crackles, wheeze, rhonchi Ext: no edema Skin: warm, dry Left 3rd toe on dorsal side with 2-3 mm ulceration, with thickened callus around, nontender, no active drainage Neuro: grossly normal, moves all extremities, antalgic gait  Assessment/Plan:  Diabetic ulcer of left foot associated with type 2 diabetes  mellitus MRI Left foot ordered -concern for osteo. Stat ordered to see if no osteo before surgery for R knee. If osteo, will have to cancel surgery. Would likely refer to either Dr. Paulla Dolly of podiatry or Dr. Sharol Given of orthopedics. Will discuss pending results- results will be evaluated by me, Wound care on Friday, and likely ortho as well [place] [facility].    Phone number to be used [phone]  Orders Placed This Encounter  Procedures  . MR Foot Left W Wo Contrast    Standing Status: Future     Number of Occurrences:      Standing Expiration Date: 02/24/2016    Scheduling Instructions:     Patient has surgery 12/30/14. Want to have MRI done today or tomorrow as sees wound care on Friday. Number for patient is [phone]    Order Specific Question:  Reason for Exam (SYMPTOM  OR DIAGNOSIS REQUIRED)    Answer:  history osteomyelitis L 3rd toe. Upcoming R knee surgery- will not complete without clearane of infection. Concern for recurrence after prolonged antibiotics and hyperbaric treatment.    Order Specific Question:  Preferred imaging location?    Answer:  [facility]    Order Specific Question:  Does the patient have a pacemaker or implanted devices?    Answer:  No    Order Specific Question:  What is the patient's sedation requirement?    Answer:  No Sedation

## 2014-12-25 NOTE — Assessment & Plan Note (Signed)
MRI Left foot ordered -concern for osteo. Stat ordered to see if no osteo before surgery for R knee. If osteo, will have to cancel surgery. Would likely refer to either Dr. Paulla Dolly of podiatry or Dr. Sharol Given of orthopedics. Will discuss pending results- results will be evaluated by me, Wound care on Friday, and likely ortho as well [place] [facility].

## 2014-12-26 ENCOUNTER — Ambulatory Visit
Admission: RE | Admit: 2014-12-26 | Discharge: 2014-12-26 | Disposition: A | Discharge status: Home / Self Care | Payer: Medicare Other | Source: Ambulatory Visit | Attending: Family Medicine | Admitting: Family Medicine

## 2014-12-26 DIAGNOSIS — E11621 Type 2 diabetes mellitus with foot ulcer: Secondary | ICD-10-CM

## 2014-12-26 DIAGNOSIS — M19072 Primary osteoarthritis, left ankle and foot: Secondary | ICD-10-CM | POA: Diagnosis not present

## 2014-12-26 DIAGNOSIS — L97529 Non-pressure chronic ulcer of other part of left foot with unspecified severity: Principal | ICD-10-CM

## 2014-12-26 MED ORDER — GADOBENATE DIMEGLUMINE 529 MG/ML IV SOLN
10.0000 mL | Freq: Once | INTRAVENOUS | Status: AC | PRN
  Administered 2014-12-26: 10 mL via INTRAVENOUS

## 2014-12-27 ENCOUNTER — Encounter: Payer: Self-pay | Admitting: Nurse Practitioner

## 2014-12-27 ENCOUNTER — Ambulatory Visit (INDEPENDENT_AMBULATORY_CARE_PROVIDER_SITE_OTHER): Payer: Medicare Other | Admitting: Nurse Practitioner

## 2014-12-27 ENCOUNTER — Encounter: Payer: Self-pay | Admitting: Family Medicine

## 2014-12-27 ENCOUNTER — Encounter (HOSPITAL_BASED_OUTPATIENT_CLINIC_OR_DEPARTMENT_OTHER): Payer: Medicare Other

## 2014-12-27 VITALS — BP 124/70 | Resp 20 | Wt 245.0 lb

## 2014-12-27 DIAGNOSIS — N952 Postmenopausal atrophic vaginitis: Secondary | ICD-10-CM | POA: Diagnosis not present

## 2014-12-27 DIAGNOSIS — M1711 Unilateral primary osteoarthritis, right knee: Secondary | ICD-10-CM | POA: Diagnosis not present

## 2014-12-27 DIAGNOSIS — M2042 Other hammer toe(s) (acquired), left foot: Secondary | ICD-10-CM | POA: Diagnosis not present

## 2014-12-27 NOTE — Progress Notes (Signed)
S:  This 74 yo MW Female presents to have her vaginal Estring removed.  She is now leaving the ring in for 4 months at a time secondary to cost.  This one has been in place since 08/16/14. Will be having right knee replacement on Monday.  So she is here today to have the ring removed as she is aware of risk factors post surgery for having DVT.  She plans to have another one replaced in 2-3 months after recovery.  She feels well otherwise except for bilateral knee pain and is ready to proceed with surgery.  She has an apt. Today to see surgeon.  O: External: no lesions  Vaginal: No vaginal discharge - she is quite dry and a ring forceps is used to remove the ring.  There are no excoriations or lesions.  No tenderness at the bladder.  A:   Atrophic vaginitis with use of vaginal estrogen  Upcoming right knee replacement on Monday  Plan: She can re insert the vaginal ring on her own in 2-3 months after recovery  She is aware of risk of DVT and will mobilize ASAP

## 2014-12-27 NOTE — Patient Instructions (Signed)
As discussed leave ring out for surgery

## 2014-12-27 NOTE — Progress Notes (Addendum)
SEE MRI NOTE 12-26-14 EPIC, LEFT MESSAGE FOR SHERRY WILL IS THERE A CLEARANCE NOTE FOR SURGERY FOR Monday 12-30-14 CONCERNING OSTEOMYELITIS OF 2nd  LEFT toe AND TO FAX CLEARANACE NOTE IF NOTE IS AVAILABLE.spoke with wound care center and patient cancelled appointment for 12-27-2014

## 2014-12-27 NOTE — Progress Notes (Signed)
[facility] note dr hunter on chart for 12-30-14 surgery

## 2014-12-29 NOTE — Progress Notes (Signed)
Encounter reviewed by Dr. Brook Amundson C. Silva.  

## 2014-12-29 NOTE — H&P (Signed)
[name]. Barthel DOB: 1940-12-31 Married / Language: English / Race: White Female Date of Admission: 12/30/2014 CC: Right Knee Pain History of Present Illness The patient is a 74 year old female who comes in for a preoperative History and Physical. The patient is scheduled for a right total knee arthroplasty to be performed by Dr. Dione Plover. Aluisio, MD at [facility] on 12/30/2014. The patient is a 74 year old female who presentedfor follow up of their knee. The patient is being followed for their right knee pain and Baker's Cyst. They have undegone several gel injection series. Symptoms reported include: pain and difficulty ambulating. The patient feels that they are doing poorly and report their pain level to be moderate. The patient has reported only temporary improvement of their symptoms with: viscosupplementation. [name] comes in for recheck of her left and right knees and Baker cyst on the left. This round of viscosupplementation only lasted about 3-4 weeks and has worn off. She had her Baker cyst aspirated several times by Dr. Delilah Shan and had it injected with some breif relief. She states that the aspiration helped, but she did not feel that the cortisone injection really helped much. She has now reached a point where she would like to get something more permanant done about her knee. They have been treated conservatively in the past for the above stated problem and despite conservative measures, they continue to have progressive pain and severe functional limitations and dysfunction. They have failed non-operative management including home exercise, medications, and injections. It is felt that they would benefit from undergoing total joint replacement. Risks and benefits of the procedure have been discussed with the patient and they elect to proceed with surgery. There are no active contraindications to surgery such as ongoing infection or rapidly progressive neurological  disease.  Problem List/Past Medical Hammer toe (M20.40) Foot infection (L08.9) Flat foot (M21.40) Posterior tibial tendinitis (V03.500) Primary osteoarthritis of left knee (M17.12) Synovial cyst of popliteal space, left (M71.22) Fracture, humerus, head (X38.182X) Hyperthyroidism Migraine Headache Peripheral Neuropathy Osteoarthritis High blood pressure Diabetes Mellitus Hypertension Autoimmune disorder mother Breast Cancer Shingles Depression Glaucoma Cataracts History of Bronchitis History of Pneumonia Hemorrhoids Irritable Bowel Syndrome Diverticulosis History of Cystitis Measles Mumps Rubella Eczema  Allergies Macrodantin *URINARY ANTI-INFECTIVES* Keflex *CEPHALOSPORINS* Erythromycin *MACROLIDES Percocet - Itching Levaquin - Tendon Pain  Family History Chronic Obstructive Lung Disease father Heart Disease father Osteoarthritis father Osteoporosis mother Rheumatoid Arthritis Mother. mother  Social History No alcohol use Tobacco use Never smoker. never smoker Alcohol use never consumed alcohol Children 1 Current work status retired Engineer, agricultural (Currently) no Drug/Alcohol Rehab (Previously) no Exercise Exercises rarely Illicit drug use no Living situation live with spouse Marital status married Number of flights of stairs before winded greater than 5 Pain Contract no Living Will and Healthcare POA  Medication History  Acidophilus Aspirin Atenolol Bimatoprost Ophthalmic Centrum Silver Citrucel Co-Q-10 Vit B-12 injection Dorzolamide-timolol opthalmic Ester-C Estradiol Ring Fish Oil Flax Seed Oil Fluoxetine Glimepiride Grape Seed Extract Meloxicam Pramipexole Selenium Temazepam Victoza  Past Surgical History  Mastectomy - Both Gallbladder Surgery laporoscopic Cholecystectomy Dilation and Curettage of Uterus Ovary Removal - Both Tonsillectomy Mastectomy - Bilateral  bilateral Thyroidectomy; Total Hysterectomy partial (non-cancerous)  Review of Systems  Constitutional: Negative.  HENT: Negative.  Respiratory: Negative.  Cardiovascular: Negative.  Gastrointestinal: Positive for diarrhea.  Genitourinary: Negative.  Musculoskeletal: Positive for joint pain.  Psychiatric/Behavioral: The patient has insomnia.   Vitals  12/05/2014 3:38 PM Weight: 245 lb Height: 72in Weight  was reported by patient. Height was reported by patient. Body Surface Area: 2.32 m Body Mass Index: 33.23 kg/m  BP: 132/72 (Sitting, Right Arm, Standard)  Physical Exam  General Mental Status -Alert, cooperative and good historian. General Appearance-pleasant, Not in acute distress. Orientation-Oriented X3. Build & Nutrition-Well nourished and Well developed.  Head and Neck Head-normocephalic, atraumatic . Neck Global Assessment - supple, no bruit auscultated on the right, no bruit auscultated on the left.  Eye Pupil - Bilateral-Regular and Round. Motion - Bilateral-EOMI.  Chest and Lung Exam Auscultation Breath sounds - clear at anterior chest wall and clear at posterior chest wall. Adventitious sounds - No Adventitious sounds.  Cardiovascular Auscultation Rhythm - Regular rate and rhythm. Heart Sounds - S1 WNL and S2 WNL. Murmurs & Other Heart Sounds - Auscultation of the heart reveals - No Murmurs.  Abdomen Palpation/Percussion Tenderness - Abdomen is non-tender to palpation. Rigidity (guarding) - Abdomen is soft. Auscultation Auscultation of the abdomen reveals - Bowel sounds normal.  Female Genitourinary Note: Not done, not pertinent to present illness  Musculoskeletal Note: Right Knee: Inspection and Palpation - Tenderness - lateral knee tender to palpation, no tenderness to palpation of the medial knee, no tenderness to palpation of the posterior knee. Swelling - mild. ROM: Flexion - AROM - 115 . Extension - AROM -  -10 . Gait and Station: Evaluation of Gait - Gait Pattern - antalgic.  Assessment & Plan Primary osteoarthritis of right knee (M17.11) Note:Surgical Plans: Right Total Knee Replacement  Disposition: Home  PCP: Dr. Yong Channel - Patient has been seen preop and felt to be stable for dsurgery  Topical TXA - History of Breast Cancer  Arlee Muslim, PA-C

## 2014-12-30 ENCOUNTER — Inpatient Hospital Stay (HOSPITAL_COMMUNITY)
Admission: RE | Admit: 2014-12-30 | Discharge: 2015-01-02 | DRG: 470 | Disposition: A | Discharge status: Home Health | Payer: Medicare Other | Source: Ambulatory Visit | Attending: Orthopedic Surgery | Admitting: Orthopedic Surgery

## 2014-12-30 ENCOUNTER — Encounter (HOSPITAL_COMMUNITY): Payer: Self-pay | Admitting: Certified Registered"

## 2014-12-30 ENCOUNTER — Inpatient Hospital Stay (HOSPITAL_COMMUNITY): Payer: Medicare Other | Admitting: Certified Registered"

## 2014-12-30 ENCOUNTER — Encounter (HOSPITAL_COMMUNITY)
Admission: RE | Disposition: A | Discharge status: Home Health | Payer: Self-pay | Source: Ambulatory Visit | Attending: Orthopedic Surgery

## 2014-12-30 DIAGNOSIS — Z8261 Family history of arthritis: Secondary | ICD-10-CM

## 2014-12-30 DIAGNOSIS — Z7982 Long term (current) use of aspirin: Secondary | ICD-10-CM

## 2014-12-30 DIAGNOSIS — M1710 Unilateral primary osteoarthritis, unspecified knee: Secondary | ICD-10-CM | POA: Diagnosis present

## 2014-12-30 DIAGNOSIS — Z9049 Acquired absence of other specified parts of digestive tract: Secondary | ICD-10-CM | POA: Diagnosis present

## 2014-12-30 DIAGNOSIS — E119 Type 2 diabetes mellitus without complications: Secondary | ICD-10-CM | POA: Diagnosis not present

## 2014-12-30 DIAGNOSIS — I10 Essential (primary) hypertension: Secondary | ICD-10-CM | POA: Diagnosis present

## 2014-12-30 DIAGNOSIS — Z853 Personal history of malignant neoplasm of breast: Secondary | ICD-10-CM

## 2014-12-30 DIAGNOSIS — M1711 Unilateral primary osteoarthritis, right knee: Secondary | ICD-10-CM | POA: Diagnosis not present

## 2014-12-30 DIAGNOSIS — G629 Polyneuropathy, unspecified: Secondary | ICD-10-CM | POA: Diagnosis present

## 2014-12-30 DIAGNOSIS — Z9013 Acquired absence of bilateral breasts and nipples: Secondary | ICD-10-CM | POA: Diagnosis present

## 2014-12-30 DIAGNOSIS — M25561 Pain in right knee: Secondary | ICD-10-CM | POA: Diagnosis not present

## 2014-12-30 DIAGNOSIS — Z01812 Encounter for preprocedural laboratory examination: Secondary | ICD-10-CM

## 2014-12-30 DIAGNOSIS — M8580 Other specified disorders of bone density and structure, unspecified site: Secondary | ICD-10-CM | POA: Diagnosis present

## 2014-12-30 DIAGNOSIS — E0590 Thyrotoxicosis, unspecified without thyrotoxic crisis or storm: Secondary | ICD-10-CM | POA: Diagnosis present

## 2014-12-30 DIAGNOSIS — M179 Osteoarthritis of knee, unspecified: Secondary | ICD-10-CM | POA: Diagnosis not present

## 2014-12-30 DIAGNOSIS — M7120 Synovial cyst of popliteal space [Baker], unspecified knee: Secondary | ICD-10-CM | POA: Diagnosis present

## 2014-12-30 DIAGNOSIS — E559 Vitamin D deficiency, unspecified: Secondary | ICD-10-CM | POA: Diagnosis present

## 2014-12-30 DIAGNOSIS — Z79899 Other long term (current) drug therapy: Secondary | ICD-10-CM

## 2014-12-30 HISTORY — PX: TOTAL KNEE ARTHROPLASTY: SHX125

## 2014-12-30 LAB — GLUCOSE, CAPILLARY
GLUCOSE-CAPILLARY: 228 mg/dL — AB (ref 65–99)
Glucose-Capillary: 142 mg/dL — ABNORMAL HIGH (ref 65–99)
Glucose-Capillary: 90 mg/dL (ref 65–99)
Glucose-Capillary: 143 mg/dL — ABNORMAL HIGH (ref 65–99)

## 2014-12-30 LAB — TYPE AND SCREEN
ABO/RH(D): O POS
ANTIBODY SCREEN: NEGATIVE

## 2014-12-30 SURGERY — ARTHROPLASTY, KNEE, TOTAL
Anesthesia: Monitor Anesthesia Care | Site: Knee | Laterality: Right

## 2014-12-30 MED ORDER — ACETAMINOPHEN 650 MG RE SUPP: 650.0000 mg | Freq: Four times a day (QID) | RECTAL | Status: DC | PRN

## 2014-12-30 MED ORDER — BUPIVACAINE LIPOSOME 1.3 % IJ SUSP
20.0000 mL | Freq: Once | INTRAMUSCULAR | Status: DC
  Filled 2014-12-30: qty 20

## 2014-12-30 MED ORDER — DORZOLAMIDE HCL-TIMOLOL MAL 2-0.5 % OP SOLN
1.0000 [drp] | Freq: Two times a day (BID) | OPHTHALMIC | Status: DC
  Administered 2014-12-30 – 2015-01-02 (×6): 1 [drp] via OPHTHALMIC
  Filled 2014-12-30: qty 10

## 2014-12-30 MED ORDER — SODIUM CHLORIDE 0.9 % IJ SOLN
INTRAMUSCULAR | Status: DC | PRN
  Administered 2014-12-30: 50 mL

## 2014-12-30 MED ORDER — DEXAMETHASONE SODIUM PHOSPHATE 10 MG/ML IJ SOLN
10.0000 mg | Freq: Once | INTRAMUSCULAR | Status: AC
  Administered 2014-12-30: 10 mg via INTRAVENOUS

## 2014-12-30 MED ORDER — DEXAMETHASONE SODIUM PHOSPHATE 10 MG/ML IJ SOLN
10.0000 mg | Freq: Once | INTRAMUSCULAR | Status: AC
  Administered 2014-12-31: 10 mg via INTRAVENOUS
  Filled 2014-12-30: qty 1

## 2014-12-30 MED ORDER — TRANEXAMIC ACID 1000 MG/10ML IV SOLN
2000.0000 mg | INTRAVENOUS | Status: DC | PRN
  Administered 2014-12-30: 2000 mg via TOPICAL

## 2014-12-30 MED ORDER — FLEET ENEMA 7-19 GM/118ML RE ENEM: 1.0000 | ENEMA | Freq: Once | RECTAL | Status: DC | PRN

## 2014-12-30 MED ORDER — METOCLOPRAMIDE HCL 10 MG PO TABS: 5.0000 mg | ORAL_TABLET | Freq: Three times a day (TID) | ORAL | Status: DC | PRN

## 2014-12-30 MED ORDER — DIPHENHYDRAMINE HCL 12.5 MG/5ML PO ELIX: 12.5000 mg | ORAL_SOLUTION | ORAL | Status: DC | PRN

## 2014-12-30 MED ORDER — CEFAZOLIN SODIUM-DEXTROSE 2-3 GM-% IV SOLR
INTRAVENOUS | Status: AC
  Filled 2014-12-30: qty 50

## 2014-12-30 MED ORDER — ATENOLOL 25 MG PO TABS
25.0000 mg | ORAL_TABLET | Freq: Every day | ORAL | Status: DC
  Administered 2014-12-31 – 2015-01-02 (×3): 25 mg via ORAL
  Filled 2014-12-30 (×3): qty 1

## 2014-12-30 MED ORDER — GLIMEPIRIDE 4 MG PO TABS
4.0000 mg | ORAL_TABLET | Freq: Every day | ORAL | Status: DC
  Administered 2014-12-31 – 2015-01-02 (×3): 4 mg via ORAL
  Filled 2014-12-30 (×4): qty 1

## 2014-12-30 MED ORDER — PRAMIPEXOLE DIHYDROCHLORIDE 0.25 MG PO TABS
0.5000 mg | ORAL_TABLET | Freq: Every day | ORAL | Status: DC
  Administered 2014-12-30 – 2015-01-01 (×3): 0.5 mg via ORAL
  Filled 2014-12-30 (×4): qty 2

## 2014-12-30 MED ORDER — PHENYLEPHRINE HCL 10 MG/ML IJ SOLN
INTRAMUSCULAR | Status: DC | PRN
  Administered 2014-12-30 (×3): 80 ug via INTRAVENOUS
  Administered 2014-12-30 (×3): 40 ug via INTRAVENOUS
  Administered 2014-12-30: 80 ug via INTRAVENOUS

## 2014-12-30 MED ORDER — SODIUM CHLORIDE 0.9 % IR SOLN
Status: DC | PRN
  Administered 2014-12-30: 1000 mL

## 2014-12-30 MED ORDER — MIDAZOLAM HCL 5 MG/5ML IJ SOLN
INTRAMUSCULAR | Status: DC | PRN
  Administered 2014-12-30: 2 mg via INTRAVENOUS

## 2014-12-30 MED ORDER — METHOCARBAMOL 1000 MG/10ML IJ SOLN
500.0000 mg | Freq: Four times a day (QID) | INTRAVENOUS | Status: DC | PRN
  Filled 2014-12-30: qty 5

## 2014-12-30 MED ORDER — 0.9 % SODIUM CHLORIDE (POUR BTL) OPTIME
TOPICAL | Status: DC | PRN
  Administered 2014-12-30: 1000 mL

## 2014-12-30 MED ORDER — FLUOXETINE HCL 20 MG PO CAPS
40.0000 mg | ORAL_CAPSULE | Freq: Every day | ORAL | Status: DC
  Administered 2014-12-31 – 2015-01-02 (×3): 40 mg via ORAL
  Filled 2014-12-30 (×3): qty 2

## 2014-12-30 MED ORDER — METHOCARBAMOL 500 MG PO TABS
500.0000 mg | ORAL_TABLET | Freq: Four times a day (QID) | ORAL | Status: DC | PRN
  Administered 2014-12-30 – 2015-01-02 (×8): 500 mg via ORAL
  Filled 2014-12-30 (×8): qty 1

## 2014-12-30 MED ORDER — SODIUM CHLORIDE 0.9 % IV SOLN: INTRAVENOUS | Status: DC

## 2014-12-30 MED ORDER — INSULIN ASPART 100 UNIT/ML ~~LOC~~ SOLN
0.0000 [IU] | Freq: Three times a day (TID) | SUBCUTANEOUS | Status: DC
  Administered 2014-12-30: 2 [IU] via SUBCUTANEOUS
  Administered 2014-12-31: 5 [IU] via SUBCUTANEOUS
  Administered 2014-12-31: 2 [IU] via SUBCUTANEOUS
  Administered 2014-12-31 – 2015-01-02 (×4): 3 [IU] via SUBCUTANEOUS

## 2014-12-30 MED ORDER — MIDAZOLAM HCL 2 MG/2ML IJ SOLN
INTRAMUSCULAR | Status: AC
  Filled 2014-12-30: qty 4

## 2014-12-30 MED ORDER — DEXAMETHASONE SODIUM PHOSPHATE 10 MG/ML IJ SOLN
INTRAMUSCULAR | Status: AC
  Filled 2014-12-30: qty 1

## 2014-12-30 MED ORDER — LACTATED RINGERS IV SOLN: INTRAVENOUS | Status: DC

## 2014-12-30 MED ORDER — BUPIVACAINE LIPOSOME 1.3 % IJ SUSP
INTRAMUSCULAR | Status: DC | PRN
  Administered 2014-12-30: 20 mL

## 2014-12-30 MED ORDER — PROPOFOL INFUSION 10 MG/ML OPTIME
INTRAVENOUS | Status: DC | PRN
  Administered 2014-12-30: 75 ug/kg/min via INTRAVENOUS
  Administered 2014-12-30: 50 ug/kg/min via INTRAVENOUS

## 2014-12-30 MED ORDER — SODIUM CHLORIDE 0.9 % IJ SOLN
INTRAMUSCULAR | Status: AC
  Filled 2014-12-30: qty 50

## 2014-12-30 MED ORDER — HYDROMORPHONE HCL 1 MG/ML IJ SOLN
0.5000 mg | INTRAMUSCULAR | Status: DC | PRN
  Administered 2014-12-30: 1 mg via INTRAVENOUS
  Administered 2014-12-30: 0.5 mg via INTRAVENOUS
  Administered 2014-12-31: 1 mg via INTRAVENOUS
  Filled 2014-12-30 (×3): qty 1

## 2014-12-30 MED ORDER — ONDANSETRON HCL 4 MG/2ML IJ SOLN: 4.0000 mg | Freq: Four times a day (QID) | INTRAMUSCULAR | Status: DC | PRN

## 2014-12-30 MED ORDER — MENTHOL 3 MG MT LOZG: 1.0000 | LOZENGE | OROMUCOSAL | Status: DC | PRN

## 2014-12-30 MED ORDER — VANCOMYCIN HCL IN DEXTROSE 1-5 GM/200ML-% IV SOLN
1000.0000 mg | Freq: Two times a day (BID) | INTRAVENOUS | Status: AC
  Administered 2014-12-31: 1000 mg via INTRAVENOUS
  Filled 2014-12-30: qty 200

## 2014-12-30 MED ORDER — ONDANSETRON HCL 4 MG/2ML IJ SOLN
INTRAMUSCULAR | Status: DC | PRN
  Administered 2014-12-30: 4 mg via INTRAVENOUS

## 2014-12-30 MED ORDER — PROPOFOL 10 MG/ML IV BOLUS
INTRAVENOUS | Status: AC
  Filled 2014-12-30: qty 20

## 2014-12-30 MED ORDER — HYDROMORPHONE HCL 2 MG PO TABS
2.0000 mg | ORAL_TABLET | ORAL | Status: DC | PRN
  Administered 2014-12-30 – 2015-01-01 (×7): 4 mg via ORAL
  Filled 2014-12-30 (×8): qty 2

## 2014-12-30 MED ORDER — TRANEXAMIC ACID 1000 MG/10ML IV SOLN
2000.0000 mg | Freq: Once | INTRAVENOUS | Status: DC
  Filled 2014-12-30: qty 20

## 2014-12-30 MED ORDER — POLYETHYLENE GLYCOL 3350 17 G PO PACK: 17.0000 g | PACK | Freq: Every day | ORAL | Status: DC | PRN

## 2014-12-30 MED ORDER — ONDANSETRON HCL 4 MG/2ML IJ SOLN
INTRAMUSCULAR | Status: AC
  Filled 2014-12-30: qty 2

## 2014-12-30 MED ORDER — SODIUM CHLORIDE 0.9 % IV SOLN
INTRAVENOUS | Status: DC
  Administered 2014-12-30: 16:00:00 via INTRAVENOUS

## 2014-12-30 MED ORDER — TEMAZEPAM 15 MG PO CAPS
30.0000 mg | ORAL_CAPSULE | Freq: Every evening | ORAL | Status: DC | PRN
  Administered 2014-12-31 – 2015-01-01 (×2): 30 mg via ORAL
  Filled 2014-12-30 (×3): qty 2

## 2014-12-30 MED ORDER — DOCUSATE SODIUM 100 MG PO CAPS
100.0000 mg | ORAL_CAPSULE | Freq: Two times a day (BID) | ORAL | Status: DC
  Administered 2014-12-30 – 2015-01-02 (×6): 100 mg via ORAL

## 2014-12-30 MED ORDER — CEFAZOLIN SODIUM-DEXTROSE 2-3 GM-% IV SOLR
2.0000 g | INTRAVENOUS | Status: AC
  Administered 2014-12-30: 2 g via INTRAVENOUS

## 2014-12-30 MED ORDER — BUPIVACAINE HCL 0.25 % IJ SOLN
INTRAMUSCULAR | Status: DC | PRN
Start: 2014-12-30 — End: 2014-12-30
  Administered 2014-12-30: 20 mL

## 2014-12-30 MED ORDER — METOCLOPRAMIDE HCL 5 MG/ML IJ SOLN: 5.0000 mg | Freq: Three times a day (TID) | INTRAMUSCULAR | Status: DC | PRN

## 2014-12-30 MED ORDER — BISACODYL 10 MG RE SUPP: 10.0000 mg | Freq: Every day | RECTAL | Status: DC | PRN

## 2014-12-30 MED ORDER — BUPIVACAINE IN DEXTROSE 0.75-8.25 % IT SOLN
INTRATHECAL | Status: DC | PRN
  Administered 2014-12-30: 1.8 mL via INTRATHECAL

## 2014-12-30 MED ORDER — ONDANSETRON HCL 4 MG PO TABS: 4.0000 mg | ORAL_TABLET | Freq: Four times a day (QID) | ORAL | Status: DC | PRN

## 2014-12-30 MED ORDER — TRAMADOL HCL 50 MG PO TABS
50.0000 mg | ORAL_TABLET | Freq: Four times a day (QID) | ORAL | Status: DC | PRN
Start: 2014-12-30 — End: 2015-01-02
  Administered 2014-12-30 – 2015-01-01 (×4): 100 mg via ORAL
  Administered 2015-01-01: 50 mg via ORAL
  Administered 2015-01-02 (×2): 100 mg via ORAL
  Filled 2014-12-30: qty 1
  Filled 2014-12-30 (×5): qty 2
  Filled 2014-12-30: qty 1
  Filled 2014-12-30 (×2): qty 2

## 2014-12-30 MED ORDER — FENTANYL CITRATE (PF) 100 MCG/2ML IJ SOLN
INTRAMUSCULAR | Status: DC | PRN
  Administered 2014-12-30: 100 ug via INTRAVENOUS

## 2014-12-30 MED ORDER — ACETAMINOPHEN 500 MG PO TABS
1000.0000 mg | ORAL_TABLET | Freq: Four times a day (QID) | ORAL | Status: AC
  Administered 2014-12-30 – 2014-12-31 (×3): 1000 mg via ORAL
  Filled 2014-12-30 (×5): qty 2

## 2014-12-30 MED ORDER — ACETAMINOPHEN 10 MG/ML IV SOLN
INTRAVENOUS | Status: AC
  Filled 2014-12-30: qty 100

## 2014-12-30 MED ORDER — BUPIVACAINE HCL (PF) 0.25 % IJ SOLN
INTRAMUSCULAR | Status: AC
  Filled 2014-12-30: qty 30

## 2014-12-30 MED ORDER — PHENYLEPHRINE 40 MCG/ML (10ML) SYRINGE FOR IV PUSH (FOR BLOOD PRESSURE SUPPORT)
PREFILLED_SYRINGE | INTRAVENOUS | Status: AC
  Filled 2014-12-30: qty 10

## 2014-12-30 MED ORDER — LIDOCAINE HCL (CARDIAC) 20 MG/ML IV SOLN
INTRAVENOUS | Status: AC
  Filled 2014-12-30: qty 5

## 2014-12-30 MED ORDER — FENTANYL CITRATE (PF) 100 MCG/2ML IJ SOLN: 25.0000 ug | INTRAMUSCULAR | Status: DC | PRN

## 2014-12-30 MED ORDER — ACETAMINOPHEN 325 MG PO TABS: 650.0000 mg | ORAL_TABLET | Freq: Four times a day (QID) | ORAL | Status: DC | PRN

## 2014-12-30 MED ORDER — FENTANYL CITRATE (PF) 100 MCG/2ML IJ SOLN
INTRAMUSCULAR | Status: AC
  Filled 2014-12-30: qty 4

## 2014-12-30 MED ORDER — LACTATED RINGERS IV SOLN
INTRAVENOUS | Status: DC | PRN
  Administered 2014-12-30 (×3): via INTRAVENOUS

## 2014-12-30 MED ORDER — LIDOCAINE HCL (CARDIAC) 20 MG/ML IV SOLN
INTRAVENOUS | Status: DC | PRN
  Administered 2014-12-30: 50 mg via INTRAVENOUS

## 2014-12-30 MED ORDER — LATANOPROST 0.005 % OP SOLN
1.0000 [drp] | Freq: Every day | OPHTHALMIC | Status: DC
  Administered 2014-12-30 – 2015-01-01 (×3): 1 [drp] via OPHTHALMIC
  Filled 2014-12-30: qty 2.5

## 2014-12-30 MED ORDER — CHLORHEXIDINE GLUCONATE 4 % EX LIQD: 60.0000 mL | Freq: Once | CUTANEOUS | Status: DC

## 2014-12-30 MED ORDER — PHENOL 1.4 % MT LIQD
1.0000 | OROMUCOSAL | Status: DC | PRN
  Filled 2014-12-30: qty 177

## 2014-12-30 MED ORDER — ACETAMINOPHEN 10 MG/ML IV SOLN
1000.0000 mg | Freq: Once | INTRAVENOUS | Status: AC
  Administered 2014-12-30: 1000 mg via INTRAVENOUS
  Filled 2014-12-30: qty 100

## 2014-12-30 MED ORDER — RIVAROXABAN 10 MG PO TABS
10.0000 mg | ORAL_TABLET | Freq: Every day | ORAL | Status: DC
  Administered 2014-12-31 – 2015-01-02 (×3): 10 mg via ORAL
  Filled 2014-12-30 (×4): qty 1

## 2014-12-30 SURGICAL SUPPLY — 59 items
BAG DECANTER FOR FLEXI CONT (MISCELLANEOUS) ×2 IMPLANT
BAG ZIPLOCK 12X15 (MISCELLANEOUS) ×2 IMPLANT
BANDAGE ELASTIC 6 VELCRO ST LF (GAUZE/BANDAGES/DRESSINGS) ×2 IMPLANT
BANDAGE ESMARK 6X9 LF (GAUZE/BANDAGES/DRESSINGS) ×1 IMPLANT
BLADE SAG 18X100X1.27 (BLADE) ×2 IMPLANT
BLADE SAW SGTL 11.0X1.19X90.0M (BLADE) ×2 IMPLANT
BNDG ESMARK 6X9 LF (GAUZE/BANDAGES/DRESSINGS) ×2
BOWL SMART MIX CTS (DISPOSABLE) ×2 IMPLANT
CAP KNEE TOTAL 3 SIGMA ×2 IMPLANT
CEMENT HV SMART SET (Cement) ×4 IMPLANT
CUFF TOURN SGL QUICK 34 (TOURNIQUET CUFF) ×1
CUFF TRNQT CYL 34X4X40X1 (TOURNIQUET CUFF) ×1 IMPLANT
DECANTER SPIKE VIAL GLASS SM (MISCELLANEOUS) ×2 IMPLANT
DRAPE EXTREMITY T 121X128X90 (DRAPE) ×2 IMPLANT
DRAPE POUCH INSTRU U-SHP 10X18 (DRAPES) ×2 IMPLANT
DRAPE U-SHAPE 47X51 STRL (DRAPES) ×2 IMPLANT
DRSG ADAPTIC 3X8 NADH LF (GAUZE/BANDAGES/DRESSINGS) ×2 IMPLANT
DURAPREP 26ML APPLICATOR (WOUND CARE) ×2 IMPLANT
ELECT REM PT RETURN 9FT ADLT (ELECTROSURGICAL) ×2
ELECTRODE REM PT RTRN 9FT ADLT (ELECTROSURGICAL) ×1 IMPLANT
EVACUATOR 1/8 PVC DRAIN (DRAIN) ×2 IMPLANT
FACESHIELD WRAPAROUND (MASK) ×10 IMPLANT
GAUZE SPONGE 4X4 12PLY STRL (GAUZE/BANDAGES/DRESSINGS) ×2 IMPLANT
GLOVE BIO SURGEON STRL SZ7.5 (GLOVE) IMPLANT
GLOVE BIO SURGEON STRL SZ8 (GLOVE) ×2 IMPLANT
GLOVE BIOGEL PI IND STRL 6.5 (GLOVE) IMPLANT
GLOVE BIOGEL PI IND STRL 8 (GLOVE) ×1 IMPLANT
GLOVE BIOGEL PI INDICATOR 6.5 (GLOVE)
GLOVE BIOGEL PI INDICATOR 8 (GLOVE) ×1
GLOVE SURG SS PI 6.5 STRL IVOR (GLOVE) IMPLANT
GOWN STRL REUS W/TWL LRG LVL3 (GOWN DISPOSABLE) ×2 IMPLANT
GOWN STRL REUS W/TWL XL LVL3 (GOWN DISPOSABLE) IMPLANT
HANDPIECE INTERPULSE COAX TIP (DISPOSABLE) ×1
IMMOBILIZER KNEE 22 UNIV (SOFTGOODS) ×2 IMPLANT
KIT BASIN OR (CUSTOM PROCEDURE TRAY) ×2 IMPLANT
MANIFOLD NEPTUNE II (INSTRUMENTS) ×2 IMPLANT
NDL SAFETY ECLIPSE 18X1.5 (NEEDLE) ×3 IMPLANT
NEEDLE HYPO 18GX1.5 SHARP (NEEDLE) ×3
NS IRRIG 1000ML POUR BTL (IV SOLUTION) ×2 IMPLANT
PACK TOTAL JOINT (CUSTOM PROCEDURE TRAY) ×2 IMPLANT
PAD ABD 8X10 STRL (GAUZE/BANDAGES/DRESSINGS) ×2 IMPLANT
PADDING CAST COTTON 6X4 STRL (CAST SUPPLIES) ×2 IMPLANT
PEN SKIN MARKING BROAD (MISCELLANEOUS) ×2 IMPLANT
POSITIONER SURGICAL ARM (MISCELLANEOUS) ×2 IMPLANT
SET HNDPC FAN SPRY TIP SCT (DISPOSABLE) ×1 IMPLANT
STRIP CLOSURE SKIN 1/2X4 (GAUZE/BANDAGES/DRESSINGS) ×2 IMPLANT
SUCTION FRAZIER 12FR DISP (SUCTIONS) ×2 IMPLANT
SUT MNCRL AB 4-0 PS2 18 (SUTURE) ×2 IMPLANT
SUT VIC AB 2-0 CT1 27 (SUTURE) ×3
SUT VIC AB 2-0 CT1 TAPERPNT 27 (SUTURE) ×3 IMPLANT
SUT VLOC 180 0 24IN GS25 (SUTURE) ×2 IMPLANT
SYR 20CC LL (SYRINGE) ×2 IMPLANT
SYR 50ML LL SCALE MARK (SYRINGE) ×2 IMPLANT
TOWEL OR 17X26 10 PK STRL BLUE (TOWEL DISPOSABLE) ×2 IMPLANT
TOWEL OR NON WOVEN STRL DISP B (DISPOSABLE) IMPLANT
TRAY FOLEY W/METER SILVER 14FR (SET/KITS/TRAYS/PACK) ×2 IMPLANT
WATER STERILE IRR 1500ML POUR (IV SOLUTION) ×2 IMPLANT
WRAP KNEE MAXI GEL POST OP (GAUZE/BANDAGES/DRESSINGS) ×2 IMPLANT
YANKAUER SUCT BULB TIP 10FT TU (MISCELLANEOUS) ×2 IMPLANT

## 2014-12-30 NOTE — Transfer of Care (Signed)
Immediate Anesthesia Transfer of Care Note  Patient: [name]  Procedure(s) Performed: Procedure(s): RIGHT TOTAL KNEE ARTHROPLASTY (Right)  Patient Location: PACU  Anesthesia Type:Regional  Level of Consciousness: awake, alert  and oriented  Airway & Oxygen Therapy: Patient Spontanous Breathing and Patient connected to face mask oxygen  Post-op Assessment: Report given to RN and Post -op Vital signs reviewed and stable  Post vital signs: Reviewed and stable  Last Vitals:  Filed Vitals:   12/30/14 1008  BP: 147/69  Pulse: 85  Temp: 36.7 C  Resp: 16    Complications: No apparent anesthesia complications

## 2014-12-30 NOTE — Anesthesia Postprocedure Evaluation (Signed)
  Anesthesia Post-op Note  Patient: [name]  Procedure(s) Performed: Procedure(s): RIGHT TOTAL KNEE ARTHROPLASTY (Right)  Patient Location: PACU  Anesthesia Type:Spinal  Level of Consciousness: awake, alert  and oriented  Airway and Oxygen Therapy: Patient Spontanous Breathing  Post-op Pain: none  Post-op Assessment: Post-op Vital signs reviewed, Patient's Cardiovascular Status Stable and Respiratory Function Stable LLE Motor Response: Purposeful movement LLE Sensation: Tingling RLE Motor Response: Purposeful movement RLE Sensation: Tingling L Sensory Level: S1-Sole of foot, small toes R Sensory Level: S1-Sole of foot, small toes  Post-op Vital Signs: Reviewed and stable  Last Vitals:  Filed Vitals:   12/30/14 1631  BP: 123/42  Pulse: 83  Temp: 36.6 C  Resp: 16    Complications: No apparent anesthesia complications

## 2014-12-30 NOTE — Plan of Care (Signed)
Problem: Consults Goal: Diagnosis- Total Joint Replacement Outcome: Completed/Met Date Met:  12/30/14 Primary Total Knee

## 2014-12-30 NOTE — Interval H&P Note (Signed)
History and Physical Interval Note:  12/30/2014 12:42 PM  [name]  has presented today for surgery, with the diagnosis of right knee osteoarthritis  The various methods of treatment have been discussed with the patient and family. After consideration of risks, benefits and other options for treatment, the patient has consented to  Procedure(s): RIGHT TOTAL KNEE ARTHROPLASTY (Right) as a surgical intervention .  The patient's history has been reviewed, patient examined, no change in status, stable for surgery.  I have reviewed the patient's chart and labs.  Questions were answered to the patient's satisfaction.     [name]

## 2014-12-30 NOTE — Anesthesia Procedure Notes (Signed)
Spinal Patient location during procedure: OR End time: 12/30/2014 12:54 PM Staffing Resident/CRNA: Noralyn Pick D Performed by: anesthesiologist and resident/CRNA  Preanesthetic Checklist Completed: patient identified, site marked, surgical consent, pre-op evaluation, timeout performed, IV checked, risks and benefits discussed and monitors and equipment checked Spinal Block Patient position: sitting Prep: Betadine Patient monitoring: heart rate, continuous pulse ox and blood pressure Approach: midline Location: L3-4 Injection technique: single-shot Needle Needle type: Spinocan  Needle gauge: 22 G Needle length: 9 cm Additional Notes Expiration date of kit checked and confirmed. Patient tolerated procedure well, without complications.

## 2014-12-30 NOTE — Op Note (Signed)
Pre-operative diagnosis- Osteoarthritis  Right knee(s)  Post-operative diagnosis- Osteoarthritis Right knee(s)  Procedure-  Right  Total Knee Arthroplasty  Surgeon- Dione Plover. Duaine Radin, MD  Assistant- Arlee Muslim, PA-C   Anesthesia-  Spinal  EBL-* No blood loss amount entered *   Drains Hemovac  Tourniquet time-  Total Tourniquet Time Documented: Thigh (Right) - 28 minutes Total: Thigh (Right) - 28 minutes     Complications- None  Condition-PACU - hemodynamically stable.   Brief Clinical Note  [name] is a 74 y.o. year old female with end stage OA of her right knee with progressively worsening pain and dysfunction. She has constant pain, with activity and at rest and significant functional deficits with difficulties even with ADLs. She has had extensive non-op management including analgesics, injections of cortisone and viscosupplements, and home exercise program, but remains in significant pain with significant dysfunction.Radiographs show bone on bone arthritis medial and patellofemoral. She presents now for right Total Knee Arthroplasty.    Procedure in detail---   The patient is brought into the operating room and positioned supine on the operating table. After successful administration of  Spinal,   a tourniquet is placed high on the  Right thigh(s) and the lower extremity is prepped and draped in the usual sterile fashion. Time out is performed by the operating team and then the  Right lower extremity is wrapped in Esmarch, knee flexed and the tourniquet inflated to 300 mmHg.       A midline incision is made with a ten blade through the subcutaneous tissue to the level of the extensor mechanism. A fresh blade is used to make a medial parapatellar arthrotomy. Soft tissue over the proximal medial tibia is subperiosteally elevated to the joint line with a knife and into the semimembranosus bursa with a Cobb elevator. Soft tissue over the proximal lateral tibia is elevated  with attention being paid to avoiding the patellar tendon on the tibial tubercle. The patella is everted, knee flexed 90 degrees and the ACL and PCL are removed. Findings are bone on bone all 3 compartments with large global osteophytes.        The drill is used to create a starting hole in the distal femur and the canal is thoroughly irrigated with sterile saline to remove the fatty contents. The 5 degree Right  valgus alignment guide is placed into the femoral canal and the distal femoral cutting block is pinned to remove 10 mm off the distal femur. Resection is made with an oscillating saw.      The tibia is subluxed forward and the menisci are removed. The extramedullary alignment guide is placed referencing proximally at the medial aspect of the tibial tubercle and distally along the second metatarsal axis and tibial crest. The block is pinned to remove 42mm off the more deficient medial  side. Resection is made with an oscillating saw. Size 5is the most appropriate size for the tibia and the proximal tibia is prepared with the modular drill and keel punch for that size.      The femoral sizing guide is placed and size 5 is most appropriate. Rotation is marked off the epicondylar axis and confirmed by creating a rectangular flexion gap at 90 degrees. The size 5 cutting block is pinned in this rotation and the anterior, posterior and chamfer cuts are made with the oscillating saw. The intercondylar block is then placed and that cut is made.      Trial size 5 tibial component, trial  size 5 posterior stabilized femur and a 10  mm posterior stabilized rotating platform insert trial is placed. Full extension is achieved with excellent varus/valgus and anterior/posterior balance throughout full range of motion. The patella is everted and thickness measured to be 22  mm. Free hand resection is taken to 12 mm, a 38 template is placed, lug holes are drilled, trial patella is placed, and it tracks normally.  Osteophytes are removed off the posterior femur with the trial in place. All trials are removed and the cut bone surfaces prepared with pulsatile lavage. Cement is mixed and once ready for implantation, the size 5 tibial implant, size  5 posterior stabilized femoral component, and the size 38 patella are cemented in place and the patella is held with the clamp. The trial insert is placed and the knee held in full extension. The Exparel (20 ml mixed with 30 ml saline) and .25% Bupivicaine, are injected into the extensor mechanism, posterior capsule, medial and lateral gutters and subcutaneous tissues.  All extruded cement is removed and once the cement is hard the permanent 10 mm posterior stabilized rotating platform insert is placed into the tibial tray.      The wound is copiously irrigated with saline solution and the extensor mechanism closed over a hemovac drain with #1 V-loc suture. The tourniquet is released for a total tourniquet time of 28  minutes. Flexion against gravity is 140 degrees and the patella tracks normally. Subcutaneous tissue is closed with 2.0 vicryl and subcuticular with running 4.0 Monocryl. The incision is cleaned and dried and steri-strips and a bulky sterile dressing are applied. The limb is placed into a knee immobilizer and the patient is awakened and transported to recovery in stable condition.      Please note that a surgical assistant was a medical necessity for this procedure in order to perform it in a safe and expeditious manner. Surgical assistant was necessary to retract the ligaments and vital neurovascular structures to prevent injury to them and also necessary for proper positioning of the limb to allow for anatomic placement of the prosthesis.   Dione Plover Ellenor Wisniewski, MD    12/30/2014, 1:49 PM

## 2014-12-30 NOTE — Anesthesia Preprocedure Evaluation (Signed)
Anesthesia Evaluation  Patient identified by MRN, date of birth, ID band Patient awake    Reviewed: Allergy & Precautions, NPO status , Patient's Chart, lab work & pertinent test results  History of Anesthesia Complications (+) PONV and history of anesthetic complications  Airway Mallampati: II   Neck ROM: full    Dental   Pulmonary asthma ,  breath sounds clear to auscultation        Cardiovascular hypertension, Rhythm:regular Rate:Normal     Neuro/Psych  Headaches, Depression  Neuromuscular disease    GI/Hepatic GERD-  ,  Endo/Other  diabetes, Type 2obese  Renal/GU      Musculoskeletal  (+) Arthritis -,   Abdominal   Peds  Hematology   Anesthesia Other Findings   Reproductive/Obstetrics                             Anesthesia Physical Anesthesia Plan  ASA: II  Anesthesia Plan: MAC and Spinal   Post-op Pain Management:    Induction: Intravenous  Airway Management Planned: Simple Face Mask  Additional Equipment:   Intra-op Plan:   Post-operative Plan:   Informed Consent: I have reviewed the patients History and Physical, chart, labs and discussed the procedure including the risks, benefits and alternatives for the proposed anesthesia with the patient or authorized representative who has indicated his/her understanding and acceptance.     Plan Discussed with: CRNA, Anesthesiologist and Surgeon  Anesthesia Plan Comments:         Anesthesia Quick Evaluation

## 2014-12-31 ENCOUNTER — Encounter (HOSPITAL_COMMUNITY): Payer: Self-pay | Admitting: Orthopedic Surgery

## 2014-12-31 LAB — CBC
HCT: 32.6 % — ABNORMAL LOW (ref 36.0–46.0)
HEMOGLOBIN: 10.6 g/dL — AB (ref 12.0–15.0)
MCH: 30.3 pg (ref 26.0–34.0)
MCHC: 32.5 g/dL (ref 30.0–36.0)
MCV: 93.1 fL (ref 78.0–100.0)
Platelets: 191 10*3/uL (ref 150–400)
RBC: 3.5 MIL/uL — ABNORMAL LOW (ref 3.87–5.11)
RDW: 13.6 % (ref 11.5–15.5)
WBC: 9.7 10*3/uL (ref 4.0–10.5)

## 2014-12-31 LAB — GLUCOSE, CAPILLARY
GLUCOSE-CAPILLARY: 148 mg/dL — AB (ref 65–99)
GLUCOSE-CAPILLARY: 236 mg/dL — AB (ref 65–99)
GLUCOSE-CAPILLARY: 185 mg/dL — AB (ref 65–99)
Glucose-Capillary: 153 mg/dL — ABNORMAL HIGH (ref 65–99)

## 2014-12-31 LAB — BASIC METABOLIC PANEL
Anion gap: 4 — ABNORMAL LOW (ref 5–15)
BUN: 13 mg/dL (ref 6–20)
CHLORIDE: 101 mmol/L (ref 101–111)
CO2: 31 mmol/L (ref 22–32)
CREATININE: 0.53 mg/dL (ref 0.44–1.00)
Calcium: 8.4 mg/dL — ABNORMAL LOW (ref 8.9–10.3)
GFR calc Af Amer: >60 mL/min (ref >60)
GFR calc non Af Amer: >60 mL/min (ref >60)
GLUCOSE: 164 mg/dL — AB (ref 65–99)
Potassium: 4.4 mmol/L (ref 3.5–5.1)
SODIUM: 136 mmol/L (ref 135–145)

## 2014-12-31 NOTE — Progress Notes (Signed)
Subjective: 1 Day Post-Op Procedure(s) (LRB): RIGHT TOTAL KNEE ARTHROPLASTY (Right) Patient reports pain as mild.   Patient seen in rounds with Dr. Wynelle Link. Sitting up in bed eating breakfast.  Husband in room at bedside. Patient is well, but has had some minor complaints of pain in the knee, requiring pain medications We will start therapy today.  Plan is to go Home after hospital stay.  Objective: Vital signs in last 24 hours: Temp:  [97.5 F (36.4 C)-98.3 F (36.8 C)] 98.3 F (36.8 C) (08/23 0602) Pulse Rate:  [64-85] 72 (08/23 0602) Resp:  [14-21] 14 (08/23 0602) BP: (112-147)/(42-70) 124/50 mmHg (08/23 0602) SpO2:  [92 %-100 %] 97 % (08/23 0602) Weight:  [111.131 kg (245 lb)] 111.131 kg (245 lb) (08/22 0958)  Intake/Output from previous day:  Intake/Output Summary (Last 24 hours) at 12/31/14 0909 Last data filed at 12/31/14 0743  Gross per 24 hour  Intake 6063.33 ml  Output   2020 ml  Net 4043.33 ml    Intake/Output this shift: Total I/O In: 575 [P.O.:480; I.V.:95] Out: -   Labs:  Recent Labs  12/31/14 0500  HGB 10.6*    Recent Labs  12/31/14 0500  WBC 9.7  RBC 3.50*  HCT 32.6*  PLT 191    Recent Labs  12/31/14 0500  NA 136  K 4.4  CL 101  CO2 31  BUN 13  CREATININE 0.53  GLUCOSE 164*  CALCIUM 8.4*   No results for input(s): LABPT, INR in the last 72 hours.  EXAM General - Patient is Alert, Appropriate and Oriented Extremity - Neurovascular intact Sensation intact distally Dorsiflexion/Plantar flexion intact Dressing - dressing C/D/I Motor Function - intact, moving foot and toes well on exam.  Hemovac pulled without difficulty.  Past Medical History  Diagnosis Date  . 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 Neuropathy   . Diverticulitis   . Headache(784.0)     Migraines  . Hammer toe   . PONV (postoperative nausea and vomiting)   . Hypertension     takes Atenolol daily  . Joint pain     . Arthritis     right knee;injections every 53months   . Scoliosis   . IBS (irritable bowel syndrome)   . Vitamin D deficiency     takes Vit d daily  . Diabetes mellitus     takes Amaryl and Januvia daily  . Early cataracts, bilateral   . Glaucoma   . Endometriosis   . Gastritis   . Asthma   . COMPRESSION FRACTURE, LUMBAR VERTEBRAE 08/21/2008    Qualifier: Diagnosis of  By: Arnoldo Morale MD, Balinda Quails   . Kidney stone   . Cancer     HX BREAST CANCER  . Osteopenia   . Posterior tibial tendon dysfunction     left foot  . Eczema   . Osteomyelitis     left 2nd toe  . Difficulty sleeping   . Depression   . Open wound of second toe of left foot     Assessment/Plan: 1 Day Post-Op Procedure(s) (LRB): RIGHT TOTAL KNEE ARTHROPLASTY (Right) Principal Problem:   OA (osteoarthritis) of knee  Estimated body mass index is 33.22 kg/(m^2) as calculated from the following:   Height as of this encounter: 6' (1.829 m).   Weight as of this encounter: 111.131 kg (245 lb). Advance diet Up with therapy Plan for discharge tomorrow Discharge home with home health  DVT Prophylaxis -  Xarelto Weight-Bearing as tolerated to right leg D/C O2 and Pulse OX and try on Room Air  Arlee Muslim, PA-C Orthopaedic Surgery 12/31/2014, 9:09 AM

## 2014-12-31 NOTE — Discharge Instructions (Addendum)
° °Dr. Frank Aluisio °Total Joint Specialist °[facility] °[address] °[place], [place] [address] °[phone] ° °TOTAL KNEE REPLACEMENT POSTOPERATIVE DIRECTIONS ° °Knee Rehabilitation, Guidelines Following Surgery  °Results after knee surgery are often greatly improved when you follow the exercise, range of motion and muscle strengthening exercises prescribed by your doctor. Safety measures are also important to protect the knee from further injury. Any time any of these exercises cause you to have increased pain or swelling in your knee joint, decrease the amount until you are comfortable again and slowly increase them. If you have problems or questions, call your caregiver or physical therapist for advice.  ° °HOME CARE INSTRUCTIONS  °Remove items at home which could result in a fall. This includes throw rugs or furniture in walking pathways.  °· ICE to the affected knee every three hours for 30 minutes at a time and then as needed for pain and swelling.  Continue to use ice on the knee for pain and swelling from surgery. You may notice swelling that will progress down to the foot and ankle.  This is normal after surgery.  Elevate the leg when you are not up walking on it.   °· Continue to use the breathing machine which will help keep your temperature down.  It is common for your temperature to cycle up and down following surgery, especially at night when you are not up moving around and exerting yourself.  The breathing machine keeps your lungs expanded and your temperature down. °· Do not place pillow under knee, focus on keeping the knee straight while resting ° °DIET °You may resume your previous home diet once your are discharged from the hospital. ° °DRESSING / WOUND CARE / SHOWERING °You may shower 3 days after surgery, but keep the wounds dry during showering.  You may use an occlusive plastic wrap (Press'n Seal for example), NO SOAKING/SUBMERGING IN THE BATHTUB.  If the  bandage gets wet, change with a clean dry gauze.  If the incision gets wet, pat the wound dry with a clean towel. °You may start showering once you are discharged home but do not submerge the incision under water. Just pat the incision dry and apply a dry gauze dressing on daily. °Change the surgical dressing daily and reapply a dry dressing each time. ° °ACTIVITY °Walk with your walker as instructed. °Use walker as long as suggested by your caregivers. °Avoid periods of inactivity such as sitting longer than an hour when not asleep. This helps prevent blood clots.  °You may resume a sexual relationship in one month or when given the OK by your doctor.  °You may return to work once you are cleared by your doctor.  °Do not drive a car for 6 weeks or until released by you surgeon.  °Do not drive while taking narcotics. ° °WEIGHT BEARING °Weight bearing as tolerated with assist device (walker, cane, etc) as directed, use it as long as suggested by your surgeon or therapist, typically at least 4-6 weeks. ° °POSTOPERATIVE CONSTIPATION PROTOCOL °Constipation - defined medically as fewer than three stools per week and severe constipation as less than one stool per week. ° °One of the most common issues patients have following surgery is constipation.  Even if you have a regular bowel pattern at home, your normal regimen is likely to be disrupted due to multiple reasons following surgery.  Combination of anesthesia, postoperative narcotics, change in appetite and fluid intake all can affect your bowels.    In order to avoid complications following surgery, here are some recommendations in order to help you during your recovery period. ° °Colace (docusate) - Pick up an over-the-counter form of Colace or another stool softener and take twice a day as long as you are requiring postoperative pain medications.  Take with a full glass of water daily.  If you experience loose stools or diarrhea, hold the colace until you stool forms  back up.  If your symptoms do not get better within 1 week or if they get worse, check with your doctor. ° °Dulcolax (bisacodyl) - Pick up over-the-counter and take as directed by the product packaging as needed to assist with the movement of your bowels.  Take with a full glass of water.  Use this product as needed if not relieved by Colace only.  ° °MiraLax (polyethylene glycol) - Pick up over-the-counter to have on hand.  MiraLax is a solution that will increase the amount of water in your bowels to assist with bowel movements.  Take as directed and can mix with a glass of water, juice, soda, coffee, or tea.  Take if you go more than two days without a movement. °Do not use MiraLax more than once per day. Call your doctor if you are still constipated or irregular after using this medication for 7 days in a row. ° °If you continue to have problems with postoperative constipation, please contact the office for further assistance and recommendations.  If you experience "the worst abdominal pain ever" or develop nausea or vomiting, please contact the office immediatly for further recommendations for treatment. ° °ITCHING ° If you experience itching with your medications, try taking only a single pain pill, or even half a pain pill at a time.  You can also use Benadryl over the counter for itching or also to help with sleep.  ° °TED HOSE STOCKINGS °Wear the elastic stockings on both legs for three weeks following surgery during the day but you may remove then at night for sleeping. ° °MEDICATIONS °See your medication summary on the “After Visit Summary” that the nursing staff will review with you prior to discharge.  You may have some home medications which will be placed on hold until you complete the course of blood thinner medication.  It is important for you to complete the blood thinner medication as prescribed by your surgeon.  Continue your approved medications as instructed at time of  discharge. ° °PRECAUTIONS °If you experience chest pain or shortness of breath - call 911 immediately for transfer to the hospital emergency department.  °If you develop a fever greater that 101 F, purulent drainage from wound, increased redness or drainage from wound, foul odor from the wound/dressing, or calf pain - CONTACT YOUR SURGEON.   °                                                °FOLLOW-UP APPOINTMENTS °Make sure you keep all of your appointments after your operation with your surgeon and caregivers. You should call the office at the above phone number and make an appointment for approximately two weeks after the date of your surgery or on the date instructed by your surgeon outlined in the "After Visit Summary". ° ° °RANGE OF MOTION AND STRENGTHENING EXERCISES  °Rehabilitation of the knee is important following a knee injury or   an operation. After just a few days of immobilization, the muscles of the thigh which control the knee become weakened and shrink (atrophy). Knee exercises are designed to build up the tone and strength of the thigh muscles and to improve knee motion. Often times heat used for twenty to thirty minutes before working out will loosen up your tissues and help with improving the range of motion but do not use heat for the first two weeks following surgery. These exercises can be done on a training (exercise) mat, on the floor, on a table or on a bed. Use what ever works the best and is most comfortable for you Knee exercises include:  °Leg Lifts - While your knee is still immobilized in a splint or cast, you can do straight leg raises. Lift the leg to 60 degrees, hold for 3 sec, and slowly lower the leg. Repeat 10-20 times 2-3 times daily. Perform this exercise against resistance later as your knee gets better.  °Quad and Hamstring Sets - Tighten up the muscle on the front of the thigh (Quad) and hold for 5-10 sec. Repeat this 10-20 times hourly. Hamstring sets are done by pushing the  foot backward against an object and holding for 5-10 sec. Repeat as with quad sets.  °· Leg Slides: Lying on your back, slowly slide your foot toward your buttocks, bending your knee up off the floor (only go as far as is comfortable). Then slowly slide your foot back down until your leg is flat on the floor again. °· Angel Wings: Lying on your back spread your legs to the side as far apart as you can without causing discomfort.  °A rehabilitation program following serious knee injuries can speed recovery and prevent re-injury in the future due to weakened muscles. Contact your doctor or a physical therapist for more information on knee rehabilitation.  ° °IF YOU ARE TRANSFERRED TO A SKILLED REHAB FACILITY °If the patient is transferred to a skilled rehab facility following release from the hospital, a list of the current medications will be sent to the facility for the patient to continue.  When discharged from the skilled rehab facility, please have the facility set up the patient's Home Health Physical Therapy prior to being released. Also, the skilled facility will be responsible for providing the patient with their medications at time of release from the facility to include their pain medication, the muscle relaxants, and their blood thinner medication. If the patient is still at the rehab facility at time of the two week follow up appointment, the skilled rehab facility will also need to assist the patient in arranging follow up appointment in our office and any transportation needs. ° °MAKE SURE YOU:  °Understand these instructions.  °Get help right away if you are not doing well or get worse.  ° ° °Pick up stool softner and laxative for home use following surgery while on pain medications. °Do not submerge incision under water. °Please use good hand washing techniques while changing dressing each day. °May shower starting three days after surgery. °Please use a clean towel to pat the incision dry following  showers. °Continue to use ice for pain and swelling after surgery. °Do not use any lotions or creams on the incision until instructed by your surgeon. ° °Take Xarelto for two and a half more weeks, then discontinue Xarelto. °Once the patient has completed the blood thinner regimen, then take a Baby 81 mg Aspirin daily for three more weeks. ° ° °Information   on my medicine - XARELTO (Rivaroxaban)  This medication education was reviewed with me or my healthcare representative as part of my discharge preparation.  The pharmacist that spoke with me during my hospital stay was:  Luiz Ochoa [facility]  Why was Xarelto prescribed for you? Xarelto was prescribed for you to reduce the risk of blood clots forming after orthopedic surgery. The medical term for these abnormal blood clots is venous thromboembolism (VTE).  What do you need to know about xarelto ? Take your Xarelto ONCE DAILY at the same time every day. You may take it either with or without food.  If you have difficulty swallowing the tablet whole, you may crush it and mix in applesauce just prior to taking your dose.  Take Xarelto exactly as prescribed by your doctor and DO NOT stop taking Xarelto without talking to the doctor who prescribed the medication.  Stopping without other VTE prevention medication to take the place of Xarelto may increase your risk of developing a clot.  After discharge, you should have regular check-up appointments with your healthcare provider that is prescribing your Xarelto.    What do you do if you miss a dose? If you miss a dose, take it as soon as you remember on the same day then continue your regularly scheduled once daily regimen the next day. Do not take two doses of Xarelto on the same day.   Important Safety Information A possible side effect of Xarelto is bleeding. You should call your healthcare provider right away if you experience any of the following: ? Bleeding from an injury or your  nose that does not stop. ? Unusual colored urine (red or dark Legere) or unusual colored stools (red or black). ? Unusual bruising for unknown reasons. ? A serious fall or if you hit your head (even if there is no bleeding).  Some medicines may interact with Xarelto and might increase your risk of bleeding while on Xarelto. To help avoid this, consult your healthcare provider or pharmacist prior to using any new prescription or non-prescription medications, including herbals, vitamins, non-steroidal anti-inflammatory drugs (NSAIDs) and supplements.  This website has more information on Xarelto: [email or web address]

## 2014-12-31 NOTE — Evaluation (Signed)
Physical Therapy Evaluation Patient Details Name: [name] MRN: [id number] DOB: October 27, 1940 Today's Date: 12/31/2014   History of Present Illness  s/p R TKA  Clinical Impression  Pt admitted with above diagnosis. Pt currently with functional limitations due to the deficits listed below (see PT Problem List).  Pt will benefit from skilled PT to increase their independence and safety with mobility to allow discharge to the venue listed below.  Pt will benefit from HHPT at D/C, needs RW     Follow Up Recommendations Home health PT    Equipment Recommendations  Rolling walker with 5" wheels    Recommendations for Other Services       Precautions / Restrictions Precautions Precautions: Knee Required Braces or Orthoses: Knee Immobilizer - Right Restrictions Weight Bearing Restrictions: No Other Position/Activity Restrictions: WBAT      Mobility  Bed Mobility Overal bed mobility: Needs Assistance Bed Mobility: Supine to Sit     Supine to sit: Min assist     General bed mobility comments: cues for technique  Transfers Overall transfer level: Needs assistance Equipment used: Rolling walker (2 wheeled) Transfers: Sit to/from Stand Sit to Stand: Min assist         General transfer comment: cues for  hand placement and RLE position  Ambulation/Gait Ambulation/Gait assistance: Min assist;Min guard Ambulation Distance (Feet): 40 Feet Assistive device: Rolling walker (2 wheeled) Gait Pattern/deviations: Antalgic;Trunk flexed;Step-to pattern     General Gait Details: cues for sequence, posture, RW position from self  Stairs            Wheelchair Mobility    Modified Rankin (Stroke Patients Only)       Balance Overall balance assessment: Needs assistance           Standing balance-Leahy Scale: Poor                               Pertinent Vitals/Pain Pain Assessment: 0-10 Pain Score: 2  Pain Location: right knee Pain Descriptors  / Indicators: Aching;Sore Pain Intervention(s): Limited activity within patient's tolerance;Monitored during session;Premedicated before session    [place] expects to be discharged to:: Private residence Living Arrangements: Spouse/significant other Available Help at Discharge: Family Type of Home: House Home Access: Stairs to enter   [employer] of Steps: 1 and threshold Home Layout: One level;Able to live on main level with bedroom/bathroom Home Equipment: Kasandra [place]ud[place] - single point      Prior Function Level of Independence: Independent;Independent with assistive device(s)         Comments: using cane     Hand Dominance        Extremity/Trunk Assessment               Lower Extremity Assessment: RLE deficits/detail RLE Deficits / Details: knee extension and hip flexion 2/5; ankle grossly WFL; AAROM 10-30*       Communication   Communication: No difficulties  Cognition Arousal/Alertness: Awake/alert Behavior During Therapy: WFL for tasks assessed/performed Overall Cognitive Status: Within Functional Limits for tasks assessed                      General Comments      Exercises        Assessment/Plan    PT Assessment Patient needs continued PT services  PT Diagnosis Difficulty walking   PT Problem List Decreased strength;Decreased range of motion;Decreased activity tolerance;Decreased mobility;Pain  PT Treatment Interventions DME  instruction;Gait training;Functional mobility training;Stair training;Therapeutic activities;Patient/family education;Therapeutic exercise   PT Goals (Current goals can be found in the Care Plan section) Acute Rehab PT Goals Patient Stated Goal:  back to I with less pain PT Goal Formulation: With patient Time For Goal Achievement: 12/27/14 Potential to Achieve Goals: Good    Frequency 7X/week   Barriers to discharge        Co-evaluation               End of Session Equipment  Utilized During Treatment: Gait belt Activity Tolerance: Patient tolerated treatment well Patient left: with call bell/phone within reach;in chair;with family/visitor present           Time: 9381-8299 PT Time Calculation (min) (ACUTE ONLY): 27 min   Charges:   PT Evaluation $Initial PT Evaluation Tier I: 1 Procedure PT Treatments $Gait Training: 8-22 mins   PT G Codes:        Trevia Nop 01/22/2015, 11:36 AM

## 2014-12-31 NOTE — Progress Notes (Signed)
Occupational Therapy Evaluation Patient Details Name: [name] MRN: [id number] DOB: 07-26-1940 Today's Date: 12/31/2014    History of Present Illness s/p R TKA   Clinical Impression   Patient presents to OT with decreased ADL independence s/p R TKA. Will benefit from skilled OT to maximize independence and to facilitate a safe discharge.    Follow Up Recommendations  No OT follow up;Supervision/Assistance - 24 hour    Equipment Recommendations  3 in 1 bedside comode    Recommendations for Other Services       Precautions / Restrictions Precautions Precautions: Knee Required Braces or Orthoses: Knee Immobilizer - Right Restrictions Weight Bearing Restrictions: No Other Position/Activity Restrictions: WBAT      Mobility Bed Mobility Overal bed mobility: Needs Assistance Bed Mobility: Supine to Sit     Supine to sit: Min assist     General bed mobility comments: cues for technique  Transfers Overall transfer level: Needs assistance Equipment used: Rolling walker (2 wheeled) Transfers: Sit to/from Stand Sit to Stand: Min assist         General transfer comment: cues for  hand placement and RLE position    Balance Overall balance assessment: Needs assistance           Standing balance-Leahy Scale: Poor                              ADL Overall ADL's : Needs assistance/impaired Eating/Feeding: Set up;Sitting   Grooming: Wash/dry hands;Set up;Sitting   Upper Body Bathing: Set up;Sitting   Lower Body Bathing: Moderate assistance;Sit to/from stand   Upper Body Dressing : Set up;Sitting   Lower Body Dressing: Moderate assistance;Sit to/from stand   Toilet Transfer: Minimal assistance;Ambulation;BSC;RW   Toileting- Clothing Manipulation and Hygiene: Minimal assistance;Sit to/from stand       Functional mobility during ADLs: Minimal assistance;Rolling walker General ADL Comments: Patient received up in recliner. Agreeable to  try toileting. Ambulated to bathroom with RW min A. Toilet transfer min A. Toileting min A. Patient reports she needs 3 in 1 for home as her set up will not provide enough assistance. While organizing furniture, this therapist accidently pulled her IV. It did not come out, but nurse made aware and will check it. OT will follow for LB dressing techniques (though no AE due to husband will assist), tub transfer with bench (pt already has), general walker safety.      Vision     Perception     Praxis      Pertinent Vitals/Pain Pain Assessment: 0-10 Pain Score: 2  Pain Location: R knee Pain Descriptors / Indicators: Aching;Sore Pain Intervention(s): Limited activity within patient's tolerance;Monitored during session     Hand Dominance Right   Extremity/Trunk Assessment Upper Extremity Assessment Upper Extremity Assessment: LUE deficits/detail LUE Deficits / Details: shoulder injury but functional   Lower Extremity Assessment Lower Extremity Assessment: Defer to PT evaluation RLE Deficits / Details: knee extension and hip flexion 2/5; ankle grossly WFL; AAROM 10-30* RLE: Unable to fully assess due to pain       Communication Communication Communication: No difficulties   Cognition Arousal/Alertness: Awake/alert Behavior During Therapy: WFL for tasks assessed/performed Overall Cognitive Status: Within Functional Limits for tasks assessed                     General Comments       Exercises       Shoulder Instructions  Home Living Family/patient expects to be discharged to:: Private residence Living Arrangements: Spouse/significant other Available Help at Discharge: Family Type of Home: House Home Access: Stairs to enter [employer] of Steps: 1 and threshold   Home Layout: One level;Able to live on main level with bedroom/bathroom         Bathroom Toilet: Handicapped height     Home Equipment: [place] - single point;Tub bench   Additional  Comments: has low rails on toilet but patient does not think they will work and would like a BSC      Prior Functioning/Environment Level of Independence: Independent;Independent with assistive device(s)        Comments: using cane    OT Diagnosis: Acute pain   OT Problem List: Decreased strength;Decreased range of motion;Impaired balance (sitting and/or standing);Decreased knowledge of use of DME or AE;Pain   OT Treatment/Interventions: Self-care/ADL training;Therapeutic exercise;DME and/or AE instruction;Therapeutic activities;Patient/family education    OT Goals(Current goals can be found in the care plan section) Acute Rehab OT Goals Patient Stated Goal:  back to I with less pain OT Goal Formulation: With patient Time For Goal Achievement: 01/07/15 Potential to Achieve Goals: Good  OT Frequency: Min 2X/week   Barriers to D/C:            Co-evaluation              End of Session Equipment Utilized During Treatment: Rolling walker;Right knee immobilizer CPM Right Knee CPM Right Knee: Off Nurse Communication: Mobility status;Other (comment) (check pt's IV)  Activity Tolerance: Patient tolerated treatment well Patient left: in chair;with call bell/phone within reach;with family/visitor present   Time: 6659-9357 OT Time Calculation (min): 23 min Charges:  OT General Charges $OT Visit: 1 Procedure OT Evaluation $Initial OT Evaluation Tier I: 1 Procedure OT Treatments $Self Care/Home Management : 8-22 mins G-Codes:    Cruzito Standre A 2015-01-09, 11:59 AM

## 2014-12-31 NOTE — Progress Notes (Signed)
Physical Therapy Treatment Patient Details Name: [name] MRN: [id number] DOB: 02-20-1941 Today's Date: 12/31/2014    History of Present Illness s/p R TKA    PT Comments    Pt  Doing well  But slightly limited by pain this pm;  Will see in am, should be ready to D/C tomorrow from PT standpoint  Follow Up Recommendations  Home health PT     Equipment Recommendations  Rolling walker with 5" wheels    Recommendations for Other Services       Precautions / Restrictions Precautions Precautions: Knee Precaution Comments: I SLR this pm Required Braces or Orthoses: Knee Immobilizer - Right Knee Immobilizer - Right: Discontinue once straight leg raise with < 10 degree lag Restrictions Weight Bearing Restrictions: No Other Position/Activity Restrictions: WBAT    Mobility  Bed Mobility Overal bed mobility: Needs Assistance Bed Mobility: Sit to Supine     Supine to sit: Min assist Sit to supine: Min assist   General bed mobility comments: cues for technique  Transfers Overall transfer level: Needs assistance Equipment used: Rolling walker (2 wheeled) Transfers: Sit to/from Stand Sit to Stand: Min assist         General transfer comment: cues for  hand placement and RLE position  Ambulation/Gait Ambulation/Gait assistance: Min guard Ambulation Distance (Feet): 45 Feet (10' more) Assistive device: Rolling walker (2 wheeled) Gait Pattern/deviations: Step-to pattern;Antalgic     General Gait Details: cues for sequence, posture, RW position from self   Stairs            Wheelchair Mobility    Modified Rankin (Stroke Patients Only)       Balance Overall balance assessment: Needs assistance           Standing balance-Leahy Scale: Poor                      Cognition Arousal/Alertness: Awake/alert Behavior During Therapy: WFL for tasks assessed/performed Overall Cognitive Status: Within Functional Limits for tasks assessed                       Exercises Total Joint Exercises Ankle Circles/Pumps: AROM;10 reps;Right Quad Sets: AROM;Strengthening;Right;10 reps Heel Slides: AAROM;Right;5 reps    General Comments        Pertinent Vitals/Pain Pain Assessment: 0-10 Pain Score: 4  Pain Location: R knee Pain Descriptors / Indicators: Aching;Sore Pain Intervention(s): Limited activity within patient's tolerance;Monitored during session;Repositioned;Ice applied    Home Living Family/patient expects to be discharged to:: Private residence Living Arrangements: Spouse/significant other Available Help at Discharge: Family Type of Home: House Home Access: Stairs to enter   Home Layout: One level;Able to live on main level with bedroom/bathroom Home Equipment: Kasandra Knudsen - single point;Tub bench Additional Comments: has low rails on toilet but patient does not think they will work and would like a BSC    Prior Function Level of Independence: Independent;Independent with assistive device(s)      Comments: using cane   PT Goals (current goals can now be found in the care plan section) Acute Rehab PT Goals Patient Stated Goal:  back to I with less pain PT Goal Formulation: With patient Time For Goal Achievement: 12/27/14 Potential to Achieve Goals: Good Progress towards PT goals: Progressing toward goals    Frequency  7X/week    PT Plan Current plan remains appropriate    Co-evaluation             End of Session Equipment  Utilized During Treatment: Gait belt Activity Tolerance: Patient tolerated treatment well Patient left: in bed;with call bell/phone within reach     Time: 1456-1521 PT Time Calculation (min) (ACUTE ONLY): 25 min  Charges:  $Gait Training: 8-22 mins $Therapeutic Activity: 8-22 mins                    G Codes:      [name] 01/14/2015, 3:30 PM

## 2015-01-01 LAB — BASIC METABOLIC PANEL
Anion gap: 5 (ref 5–15)
BUN: 16 mg/dL (ref 6–20)
CHLORIDE: 100 mmol/L — AB (ref 101–111)
CO2: 32 mmol/L (ref 22–32)
CREATININE: 0.51 mg/dL (ref 0.44–1.00)
Calcium: 8.7 mg/dL — ABNORMAL LOW (ref 8.9–10.3)
Glucose, Bld: 151 mg/dL — ABNORMAL HIGH (ref 65–99)
POTASSIUM: 3.8 mmol/L (ref 3.5–5.1)
SODIUM: 137 mmol/L (ref 135–145)

## 2015-01-01 LAB — CBC
HCT: 32.2 % — ABNORMAL LOW (ref 36.0–46.0)
HEMOGLOBIN: 10.4 g/dL — AB (ref 12.0–15.0)
MCH: 30.1 pg (ref 26.0–34.0)
MCHC: 32.3 g/dL (ref 30.0–36.0)
MCV: 93.3 fL (ref 78.0–100.0)
Platelets: 214 10*3/uL (ref 150–400)
RBC: 3.45 MIL/uL — AB (ref 3.87–5.11)
RDW: 13.8 % (ref 11.5–15.5)
WBC: 11.5 10*3/uL — ABNORMAL HIGH (ref 4.0–10.5)

## 2015-01-01 LAB — GLUCOSE, CAPILLARY
GLUCOSE-CAPILLARY: 173 mg/dL — AB (ref 65–99)
Glucose-Capillary: 114 mg/dL — ABNORMAL HIGH (ref 65–99)
Glucose-Capillary: 133 mg/dL — ABNORMAL HIGH (ref 65–99)
Glucose-Capillary: 191 mg/dL — ABNORMAL HIGH (ref 65–99)

## 2015-01-01 MED ORDER — HYDROCODONE-ACETAMINOPHEN 7.5-325 MG PO TABS: 1.0000 | ORAL_TABLET | ORAL | Status: DC | PRN

## 2015-01-01 MED ORDER — TRAMADOL HCL 50 MG PO TABS: 50.0000 mg | ORAL_TABLET | Freq: Four times a day (QID) | ORAL | Status: DC | PRN

## 2015-01-01 MED ORDER — HYDROCODONE-ACETAMINOPHEN 7.5-325 MG PO TABS
1.0000 | ORAL_TABLET | Freq: Four times a day (QID) | ORAL | Status: DC | PRN
  Administered 2015-01-01 (×2): 1 via ORAL
  Administered 2015-01-01 – 2015-01-02 (×2): 2 via ORAL
  Filled 2015-01-01: qty 2
  Filled 2015-01-01: qty 1
  Filled 2015-01-01 (×2): qty 2

## 2015-01-01 MED ORDER — RIVAROXABAN 10 MG PO TABS: 10.0000 mg | ORAL_TABLET | Freq: Every day | ORAL | Status: DC

## 2015-01-01 MED ORDER — METHOCARBAMOL 500 MG PO TABS: 500.0000 mg | ORAL_TABLET | Freq: Four times a day (QID) | ORAL | Status: DC | PRN

## 2015-01-01 NOTE — Progress Notes (Signed)
Occupational Therapy Treatment Patient Details Name: [name] MRN: [id number] DOB: [birth year] Today's Date: 01/01/2015    History of present illness s/p R TKA   OT comments  Pt with increased pain when standing but willing to participate in OT  Follow Up Recommendations  No OT follow up;Supervision/Assistance - 24 hour    Equipment Recommendations  3 in 1 bedside comode (pt is checking to see if she has this)    Recommendations for Other Services      Precautions / Restrictions Precautions Precautions: Knee Precaution Comments: could not do SLR--used KI today Required Braces or Orthoses: Knee Immobilizer - Right Knee Immobilizer - Right: Discontinue once straight leg raise with < 10 degree lag Restrictions Weight Bearing Restrictions: No Other Position/Activity Restrictions: WBAT       Mobility Bed Mobility           Sit to supine: Min assist   General bed mobility comments: alternated between assistance for leg (light) and trunk.  HOB was raised.  Pt tends to sleep in recliner  Transfers   Equipment used: Rolling walker (2 wheeled) Transfers: Sit to/from Stand Sit to Stand: Min assist         General transfer comment: cues for  hand placement and RLE position    Balance                                   ADL                           Toilet Transfer: Minimal assistance;Ambulation;BSC;RW             General ADL Comments: Pt initially needs min A assistance to stand/min guard for steps; cues for LE placement. Also needed cues for walker distance when ambulating to bathroom.  Pt has a reacher, but plans to have husband assist her at home.  She has been using tub bench at home.  Pt sits in a recliner with pillows but armrests are low:  she is not sure if she has a sturdy chair she can place next to it to use chair back to push up from.      Vision                     Perception     Praxis      Cognition    Behavior During Therapy: WFL for tasks assessed/performed Overall Cognitive Status: Within Functional Limits for tasks assessed                       Extremity/Trunk Assessment               Exercises     Shoulder Instructions       General Comments      Pertinent Vitals/ Pain       Pain Score: 8  (with weightbearing back to 5 in chair) Pain Location: R knee Pain Descriptors / Indicators: Sore Pain Intervention(s): Limited activity within patient's tolerance  Home Living                                          Prior Functioning/Environment              Frequency Min  2X/week     Progress Toward Goals  OT Goals(current goals can now be found in the care plan section)  Progress towards OT goals: Progressing toward goals     Plan      Co-evaluation                 End of Session CPM Right Knee CPM Right Knee: Off   Activity Tolerance Patient tolerated treatment well   Patient Left in chair;with call bell/phone within reach;with family/visitor present   Nurse Communication          Time: 1610-9604 OT Time Calculation (min): 23 min  Charges: OT General Charges $OT Visit: 1 Procedure OT Treatments $Self Care/Home Management : 8-22 mins $Therapeutic Activity: 8-22 mins  [name] 01/01/2015, 10:20 AM  [name], OTR/L [phone] 01/01/2015

## 2015-01-01 NOTE — Care Management Note (Signed)
Case Management Note  Patient Details  Name: SHEMEKA WARDLE MRN: [id number] Date of Birth: 1940-11-11  Subjective/Objective:                   RIGHT TOTAL KNEE ARTHROPLASTY (Right) Action/Plan:  discharge planning Expected Discharge Date:  01/01/15              Expected Discharge Plan:  [place]  In-House Referral:     Discharge planning Services  CM Consult  Post Acute Care Choice:  Home Health Choice offered to:  Patient  DME Arranged:  Walker rolling DME Agency:  [place] Arranged:  PT [facility] Agency:  [place]  Status of Service:  Completed, signed off  Medicare Important Message Given:    Date Medicare IM Given:    Medicare IM give by:    Date Additional Medicare IM Given:    Additional Medicare Important Message give by:     If discussed at [place] of Stay Meetings, dates discussed:    Additional Comments: CM met with pt in room to offer choice of home health agency.  Pt chooses [facility] to render HHPT.  Address and contact information verified by pt.  Referral given to [facility] rep, Tim (on unit).  CM called [facility] DME rep, Lecretia to please deliver the rolling walker to room so pt can discharge.  No other CM needs were communicated. Dellie Catholic, RN 01/01/2015, 1:30 PM

## 2015-01-01 NOTE — Progress Notes (Signed)
Physical Therapy Treatment Patient Details Name: BLAKELEY SCHEIER MRN: [id number] DOB: April 11, 1941 Today's Date: 2015-01-18    History of Present Illness s/p R TKA    PT Comments    Pt progressing with ambulation and also performed exercises this morning.  Pt did not feel able to attempt stairs, so will return to practice steps prior to d/c.  Follow Up Recommendations  Home health PT     Equipment Recommendations  Rolling walker with 5" wheels    Recommendations for Other Services       Precautions / Restrictions Precautions Precautions: Knee Precaution Comments: able to perform SLR Required Braces or Orthoses: Knee Immobilizer - Right Knee Immobilizer - Right: Discontinue once straight leg raise with < 10 degree lag Restrictions Weight Bearing Restrictions: No Other Position/Activity Restrictions: WBAT    Mobility  Bed Mobility           Sit to supine: Min assist   General bed mobility comments: pt in recliner on arrival  Transfers Overall transfer level: Needs assistance Equipment used: Rolling walker (2 wheeled) Transfers: Sit to/from Stand Sit to Stand: Min guard         General transfer comment: verbal cues for UE and LE positioning  Ambulation/Gait Ambulation/Gait assistance: Min guard Ambulation Distance (Feet): 60 Feet Assistive device: Rolling walker (2 wheeled) Gait Pattern/deviations: Step-to pattern;Antalgic     General Gait Details: verbal cues for sequence, RW positioning, posture   Stairs            Wheelchair Mobility    Modified Rankin (Stroke Patients Only)       Balance                                    Cognition Arousal/Alertness: Awake/alert Behavior During Therapy: WFL for tasks assessed/performed Overall Cognitive Status: Within Functional Limits for tasks assessed                      Exercises Total Joint Exercises Ankle Circles/Pumps: AROM;10 reps;Right Quad Sets:  AROM;Strengthening;Right;10 reps Short Arc Quad: AROM;Right;10 reps Heel Slides: AAROM;Right;10 reps Hip ABduction/ADduction: AROM;Right;10 reps Straight Leg Raises: AROM;Right;10 reps    General Comments        Pertinent Vitals/Pain Pain Assessment: 0-10 Pain Score: 6  Pain Location: R knee Pain Descriptors / Indicators: Sore Pain Intervention(s): Limited activity within patient's tolerance;Monitored during session;Repositioned;Ice applied    Home Living                      Prior Function            PT Goals (current goals can now be found in the care plan section) Progress towards PT goals: Progressing toward goals    Frequency  7X/week    PT Plan Current plan remains appropriate    Co-evaluation             End of Session Equipment Utilized During Treatment: Gait belt Activity Tolerance: Patient tolerated treatment well Patient left: with call bell/phone within reach;in chair     Time: 1013-1036 PT Time Calculation (min) (ACUTE ONLY): 23 min  Charges:  $Gait Training: 8-22 mins $Therapeutic Exercise: 8-22 mins                    G Codes:      Ernesteen Mihalic,KATHrine E 18-Jan-2015, 11:05 AM Carmelia Bake, PT, DPT 2015-01-18 Pager: [phone]

## 2015-01-01 NOTE — Progress Notes (Signed)
Subjective: 2 Days Post-Op Procedure(s) (LRB): RIGHT TOTAL KNEE ARTHROPLASTY (Right) Patient reports pain as mild and moderate.   Patient seen in rounds for Dr. Wynelle Link. Patient is having problems with pain in the knee, requiring pain medications. Increase in pain today and felt that the Dilaudid was not helping.  Will try Norco. Patient is concerned about being ready to go home today.  Will get two sessions of therapy today and see how she does.  Objective: Vital signs in last 24 hours: Temp:  [97.6 F (36.4 C)-98.7 F (37.1 C)] 98.7 F (37.1 C) (08/24 0524) Pulse Rate:  [66-80] 73 (08/24 0524) Resp:  [15-16] 16 (08/24 0524) BP: (108-121)/(47-65) 121/64 mmHg (08/24 0524) SpO2:  [94 %-97 %] 94 % (08/24 0524)  Intake/Output from previous day:  Intake/Output Summary (Last 24 hours) at 01/01/15 0839 Last data filed at 01/01/15 0524  Gross per 24 hour  Intake    400 ml  Output    850 ml  Net   -450 ml    Labs:  Recent Labs  12/31/14 0500 01/01/15 0423  HGB 10.6* 10.4*    Recent Labs  12/31/14 0500 01/01/15 0423  WBC 9.7 11.5*  RBC 3.50* 3.45*  HCT 32.6* 32.2*  PLT 191 214    Recent Labs  12/31/14 0500 01/01/15 0423  NA 136 137  K 4.4 3.8  CL 101 100*  CO2 31 32  BUN 13 16  CREATININE 0.53 0.51  GLUCOSE 164* 151*  CALCIUM 8.4* 8.7*   No results for input(s): LABPT, INR in the last 72 hours.  EXAM: General - Patient is Alert, Appropriate and Oriented Extremity - Neurovascular intact Sensation intact distally Incision - clean, dry, no drainage Motor Function - intact, moving foot and toes well on exam.   Assessment/Plan: 2 Days Post-Op Procedure(s) (LRB): RIGHT TOTAL KNEE ARTHROPLASTY (Right) Procedure(s) (LRB): RIGHT TOTAL KNEE ARTHROPLASTY (Right) Past Medical History  Diagnosis Date  . 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 Neuropathy   . Diverticulitis   . Headache(784.0)     Migraines  . Hammer  toe   . PONV (postoperative nausea and vomiting)   . Hypertension     takes Atenolol daily  . Joint pain   . Arthritis     right knee;injections every 32months   . Scoliosis   . IBS (irritable bowel syndrome)   . Vitamin D deficiency     takes Vit d daily  . Diabetes mellitus     takes Amaryl and Januvia daily  . Early cataracts, bilateral   . Glaucoma   . Endometriosis   . Gastritis   . Asthma   . COMPRESSION FRACTURE, LUMBAR VERTEBRAE 08/21/2008    Qualifier: Diagnosis of  By: Arnoldo Morale MD, Balinda Quails   . Kidney stone   . Cancer     HX BREAST CANCER  . Osteopenia   . Posterior tibial tendon dysfunction     left foot  . Eczema   . Osteomyelitis     left 2nd toe  . Difficulty sleeping   . Depression   . Open wound of second toe of left foot    Principal Problem:   OA (osteoarthritis) of knee  Estimated body mass index is 33.22 kg/(m^2) as calculated from the following:   Height as of this encounter: 6' (1.829 m).   Weight as of this encounter: 111.131 kg (245 lb). Up with therapy Discharge home with home  health if she meets all goals Diet - Cardiac diet and Diabetic diet Follow up - in 2 weeks Activity - WBAT Disposition - Home Condition Upon Discharge - pending D/C Meds - See DC Summary DVT Prophylaxis - Xarelto  Arlee Muslim, PA-C Orthopaedic Surgery 01/01/2015, 8:39 AM

## 2015-01-01 NOTE — Care Management Important Message (Signed)
Important Message  Patient Details  Name: [name] MRN: [id number] Date of Birth: [birth year]   Medicare Important Message Given:  [facility] notification given    [name] 01/01/2015, 2:05 [place] Message  Patient Details  Name: [name] MRN: [id number] Date of Birth: 02-16-1941   Medicare Important Message Given:  Yes-second notification given    [name] 01/01/2015, 2:05 PM

## 2015-01-01 NOTE — Discharge Summary (Signed)
Physician Discharge Summary   Patient ID: [name] MRN: [id number] DOB/AGE: 1940/12/10 74 y.o.  Admit date: 12/30/2014 Discharge date: 01/02/2015  Primary Diagnosis:  Osteoarthritis Right knee(s)  Admission Diagnoses:  Past Medical History  Diagnosis Date  . 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 Neuropathy   . Diverticulitis   . Headache(784.0)     Migraines  . Hammer toe   . PONV (postoperative nausea and vomiting)   . Hypertension     takes Atenolol daily  . Joint pain   . Arthritis     right knee;injections every 69months   . Scoliosis   . IBS (irritable bowel syndrome)   . Vitamin D deficiency     takes Vit d daily  . Diabetes mellitus     takes Amaryl and Januvia daily  . Early cataracts, bilateral   . Glaucoma   . Endometriosis   . Gastritis   . Asthma   . COMPRESSION FRACTURE, LUMBAR VERTEBRAE 08/21/2008    Qualifier: Diagnosis of  By: Arnoldo Morale MD, Balinda Quails   . Kidney stone   . Cancer     HX BREAST CANCER  . Osteopenia   . Posterior tibial tendon dysfunction     left foot  . Eczema   . Osteomyelitis     left 2nd toe  . Difficulty sleeping   . Depression   . Open wound of second toe of left foot    Discharge Diagnoses:   Principal Problem:   OA (osteoarthritis) of knee  Estimated body mass index is 33.22 kg/(m^2) as calculated from the following:   Height as of this encounter: 6' (1.829 m).   Weight as of this encounter: 111.131 kg (245 lb).  Procedure:  Procedure(s) (LRB): RIGHT TOTAL KNEE ARTHROPLASTY (Right)   Consults: None  HPI: [name] is a 74 y.o. year old female with end stage OA of her right knee with progressively worsening pain and dysfunction. She has constant pain, with activity and at rest and significant functional deficits with difficulties even with ADLs. She has had extensive non-op management including analgesics, injections of cortisone and viscosupplements, and home exercise  program, but remains in significant pain with significant dysfunction.Radiographs show bone on bone arthritis medial and patellofemoral. She presents now for right Total Knee Arthroplasty.  Laboratory Data: Admission on 12/30/2014  Component Date Value Ref Range Status  . Glucose-Capillary 12/30/2014 142* 65 - 99 mg/dL Final  . Glucose-Capillary 12/30/2014 90  65 - 99 mg/dL Final  . Glucose-Capillary 12/30/2014 143* 65 - 99 mg/dL Final  . WBC 12/31/2014 9.7  4.0 - 10.5 K/uL Final  . RBC 12/31/2014 3.50* 3.87 - 5.11 MIL/uL Final  . Hemoglobin 12/31/2014 10.6* 12.0 - 15.0 g/dL Final  . HCT 12/31/2014 32.6* 36.0 - 46.0 % Final  . MCV 12/31/2014 93.1  78.0 - 100.0 fL Final  . MCH 12/31/2014 30.3  26.0 - 34.0 pg Final  . MCHC 12/31/2014 32.5  30.0 - 36.0 g/dL Final  . RDW 12/31/2014 13.6  11.5 - 15.5 % Final  . Platelets 12/31/2014 191  150 - 400 K/uL Final  . Sodium 12/31/2014 136  135 - 145 mmol/L Final  . Potassium 12/31/2014 4.4  3.5 - 5.1 mmol/L Final  . Chloride 12/31/2014 101  101 - 111 mmol/L Final  . CO2 12/31/2014 31  22 - 32 mmol/L Final  . Glucose, Bld 12/31/2014 164* 65 - 99 mg/dL Final  .  BUN 12/31/2014 13  6 - 20 mg/dL Final  . Creatinine, Ser 12/31/2014 0.53  0.44 - 1.00 mg/dL Final  . Calcium 12/31/2014 8.4* 8.9 - 10.3 mg/dL Final  . GFR calc non Af Amer 12/31/2014 >60  >60 mL/min Final  . GFR calc Af Amer 12/31/2014 >60  >60 mL/min Final   Comment: (NOTE) The eGFR has been calculated using the CKD EPI equation. This calculation has not been validated in all clinical situations. eGFR's persistently <60 mL/min signify possible Chronic Kidney Disease.   . Anion gap 12/31/2014 4* 5 - 15 Final  . Glucose-Capillary 12/30/2014 228* 65 - 99 mg/dL Final  . Comment 1 12/30/2014 Notify RN   Final  . Glucose-Capillary 12/31/2014 153* 65 - 99 mg/dL Final  . Glucose-Capillary 12/31/2014 148* 65 - 99 mg/dL Final  . WBC 01/01/2015 11.5* 4.0 - 10.5 K/uL Final  . RBC 01/01/2015  3.45* 3.87 - 5.11 MIL/uL Final  . Hemoglobin 01/01/2015 10.4* 12.0 - 15.0 g/dL Final  . HCT 01/01/2015 32.2* 36.0 - 46.0 % Final  . MCV 01/01/2015 93.3  78.0 - 100.0 fL Final  . MCH 01/01/2015 30.1  26.0 - 34.0 pg Final  . MCHC 01/01/2015 32.3  30.0 - 36.0 g/dL Final  . RDW 01/01/2015 13.8  11.5 - 15.5 % Final  . Platelets 01/01/2015 214  150 - 400 K/uL Final  . Sodium 01/01/2015 137  135 - 145 mmol/L Final  . Potassium 01/01/2015 3.8  3.5 - 5.1 mmol/L Final  . Chloride 01/01/2015 100* 101 - 111 mmol/L Final  . CO2 01/01/2015 32  22 - 32 mmol/L Final  . Glucose, Bld 01/01/2015 151* 65 - 99 mg/dL Final  . BUN 01/01/2015 16  6 - 20 mg/dL Final  . Creatinine, Ser 01/01/2015 0.51  0.44 - 1.00 mg/dL Final  . Calcium 01/01/2015 8.7* 8.9 - 10.3 mg/dL Final  . GFR calc non Af Amer 01/01/2015 >60  >60 mL/min Final  . GFR calc Af Amer 01/01/2015 >60  >60 mL/min Final   Comment: (NOTE) The eGFR has been calculated using the CKD EPI equation. This calculation has not been validated in all clinical situations. eGFR's persistently <60 mL/min signify possible Chronic Kidney Disease.   . Anion gap 01/01/2015 5  5 - 15 Final  . Glucose-Capillary 12/31/2014 236* 65 - 99 mg/dL Final  . Glucose-Capillary 12/31/2014 185* 65 - 99 mg/dL Final  . Comment 1 12/31/2014 Notify RN   Final  . Glucose-Capillary 01/01/2015 114* 65 - 99 mg/dL Final  . Glucose-Capillary 01/01/2015 173* 65 - 99 mg/dL Final  Hospital Outpatient Visit on 12/24/2014  Component Date Value Ref Range Status  . MRSA, PCR 12/24/2014 NEGATIVE  NEGATIVE Final  . Staphylococcus aureus 12/24/2014 NEGATIVE  NEGATIVE Final   Comment:        The Xpert SA Assay (FDA approved for NASAL specimens in patients over 9 years of age), is one component of a comprehensive surveillance program.  Test performance has been validated by [facility] for patients greater than or equal to 14 year old. It is not intended to diagnose infection nor  to guide or monitor treatment.   [name] aPTT 12/24/2014 26  24 - 37 seconds Final  . WBC 12/24/2014 9.4  4.0 - 10.5 K/uL Final  . RBC 12/24/2014 4.34  3.87 - 5.11 MIL/uL Final  . Hemoglobin 12/24/2014 13.2  12.0 - 15.0 g/dL Final  . HCT 12/24/2014 40.7  36.0 - 46.0 % Final  . MCV  12/24/2014 93.8  78.0 - 100.0 fL Final  . MCH 12/24/2014 30.4  26.0 - 34.0 pg Final  . MCHC 12/24/2014 32.4  30.0 - 36.0 g/dL Final  . RDW 12/24/2014 14.2  11.5 - 15.5 % Final  . Platelets 12/24/2014 262  150 - 400 K/uL Final  . Sodium 12/24/2014 139  135 - 145 mmol/L Final  . Potassium 12/24/2014 4.2  3.5 - 5.1 mmol/L Final  . Chloride 12/24/2014 104  101 - 111 mmol/L Final  . CO2 12/24/2014 28  22 - 32 mmol/L Final  . Glucose, Bld 12/24/2014 119* 65 - 99 mg/dL Final  . BUN 12/24/2014 21* 6 - 20 mg/dL Final  . Creatinine, Ser 12/24/2014 0.49  0.44 - 1.00 mg/dL Final  . Calcium 12/24/2014 9.2  8.9 - 10.3 mg/dL Final  . Total Protein 12/24/2014 7.2  6.5 - 8.1 g/dL Final  . Albumin 12/24/2014 3.8  3.5 - 5.0 g/dL Final  . AST 12/24/2014 24  15 - 41 U/L Final  . ALT 12/24/2014 26  14 - 54 U/L Final  . Alkaline Phosphatase 12/24/2014 71  38 - 126 U/L Final  . Total Bilirubin 12/24/2014 0.6  0.3 - 1.2 mg/dL Final  . GFR calc non Af Amer 12/24/2014 >60  >60 mL/min Final  . GFR calc Af Amer 12/24/2014 >60  >60 mL/min Final   Comment: (NOTE) The eGFR has been calculated using the CKD EPI equation. This calculation has not been validated in all clinical situations. eGFR's persistently <60 mL/min signify possible Chronic Kidney Disease.   . Anion gap 12/24/2014 7  5 - 15 Final  . Prothrombin Time 12/24/2014 13.3  11.6 - 15.2 seconds Final  . INR 12/24/2014 0.99  0.00 - 1.49 Final  . ABO/RH(D) 12/24/2014 O POS   Final  . Antibody Screen 12/24/2014 NEG   Final  . Sample Expiration 12/24/2014 01/02/2015   Final  . Color, Urine 12/24/2014 YELLOW  YELLOW Final  . APPearance 12/24/2014 CLEAR  CLEAR Final  . Specific  Gravity, Urine 12/24/2014 1.015  1.005 - 1.030 Final  . pH 12/24/2014 7.5  5.0 - 8.0 Final  . Glucose, UA 12/24/2014 NEGATIVE  NEGATIVE mg/dL Final  . Hgb urine dipstick 12/24/2014 NEGATIVE  NEGATIVE Final  . Bilirubin Urine 12/24/2014 NEGATIVE  NEGATIVE Final  . Ketones, ur 12/24/2014 NEGATIVE  NEGATIVE mg/dL Final  . Protein, ur 12/24/2014 NEGATIVE  NEGATIVE mg/dL Final  . Urobilinogen, UA 12/24/2014 1.0  0.0 - 1.0 mg/dL Final  . Nitrite 12/24/2014 NEGATIVE  NEGATIVE Final  . Leukocytes, UA 12/24/2014 NEGATIVE  NEGATIVE Final   MICROSCOPIC NOT DONE ON URINES WITH NEGATIVE PROTEIN, BLOOD, LEUKOCYTES, NITRITE, OR GLUCOSE <1000 mg/dL.  . ABO/RH(D) 12/24/2014 O POS   Final     X-Rays:Mr Foot Left W Wo Contrast  12/26/2014   CLINICAL DATA:  PRIOR LEFT THIRD TOE OSTEOTOMY FOR OSTEOMYELITIS. REVALUATION FOR INFECTION.  EXAM: MRI OF THE LEFT FOREFOOT WITHOUT AND WITH CONTRAST  TECHNIQUE: Multiplanar, multisequence MR imaging was performed both before and after administration of intravenous contrast.  CONTRAST:  53mL MULTIHANCE GADOBENATE DIMEGLUMINE 529 MG/ML IV SOLN  COMPARISON:  None.  FINDINGS: There is significant patient motion degrading image quality limiting evaluation.  There is no focal marrow signal abnormality. There is no fracture or dislocation. There is evidence of prior osteotomy of the third distal phalanx. There is no area of abnormal marrow enhancement.  There is mild osteoarthritis of the first TMT joint.  There is no  fluid collection or hematoma. There is no abscess. There is a healed ulcer versus necrotic tissue at the tip of the great toe. There is no underlying osseous abnormality. The visualized flexor and extensor compartment tendons are grossly intact. The muscles are normal in signal.  IMPRESSION: There is significant patient motion degrading image quality limiting evaluation.  1. No evidence of osteomyelitis of the left foot.   Electronically Signed   By: Kathreen Devoid   On:  12/26/2014 11:38    EKG: Orders placed or performed during the hospital encounter of 12/24/14  . EKG 12-Lead  . EKG 12-Lead     Hospital Course: [name] is a 74 y.o. who was admitted to [facility]. They were brought to the operating room on 12/30/2014 and underwent Procedure(s): RIGHT TOTAL KNEE ARTHROPLASTY.  Patient tolerated the procedure well and was later transferred to the recovery room and then to the orthopaedic floor for postoperative care.  They were given PO and IV analgesics for pain control following their surgery.  They were given 24 hours of postoperative antibiotics of  Anti-infectives    Start     Dose/Rate Route Frequency Ordered Stop   12/31/14 0100  vancomycin (VANCOCIN) IVPB 1000 mg/200 mL premix     1,000 mg 200 mL/hr over 60 Minutes Intravenous Every 12 hours 12/30/14 1541 12/31/14 0120   12/30/14 0957  ceFAZolin (ANCEF) IVPB 2 g/50 mL premix     2 g 100 mL/hr over 30 Minutes Intravenous On call to O.R. 12/30/14 0957 12/30/14 1256     and started on DVT prophylaxis in the form of Xarelto.   PT and OT were ordered for total joint protocol.  Discharge planning consulted to help with postop disposition and equipment needs.  Patient had a decent night on the evening of surgery.  They started to get up OOB with therapy on day one. Hemovac drain was pulled without difficulty.  Continued to work with therapy into day two.  Dressing was changed on day two and the incision was healing well.  By day three, the patient had progressed with therapy and meeting their goals.  Incision was healing well.  Patient was seen in rounds and was ready to go home.  Discharge home with home health Diet - Cardiac diet and Diabetic diet Follow up - in 2 weeks Activity - WBAT Disposition - Home Condition Upon Discharge - Good D/C Meds - See DC Summary DVT Prophylaxis - Xarelto   Discharge Instructions    Call MD / Call 911    Complete by:  As directed   If you  experience chest pain or shortness of breath, CALL 911 and be transported to the hospital emergency room.  If you develope a fever above 101 F, pus (white drainage) or increased drainage or redness at the wound, or calf pain, call your surgeon's office.     Change dressing    Complete by:  As directed   Change dressing daily with sterile 4 x 4 inch gauze dressing and apply TED hose. Do not submerge the incision under water.     Constipation Prevention    Complete by:  As directed   Drink plenty of fluids.  Prune juice may be helpful.  You may use a stool softener, such as Colace (over the counter) 100 mg twice a day.  Use MiraLax (over the counter) for constipation as needed.     Diet - low sodium heart healthy    Complete  by:  As directed      Diet Carb Modified    Complete by:  As directed      Discharge instructions    Complete by:  As directed   Pick up stool softner and laxative for home use following surgery while on pain medications. Do not submerge incision under water. Please use good hand washing techniques while changing dressing each day. May shower starting three days after surgery. Please use a clean towel to pat the incision dry following showers. Continue to use ice for pain and swelling after surgery. Do not use any lotions or creams on the incision until instructed by your surgeon.  Take Xarelto for two and a half more weeks, then discontinue Xarelto. Once the patient has completed the blood thinner regimen, then take a Baby 81 mg Aspirin daily for three more weeks.  Postoperative Constipation Protocol  Constipation - defined medically as fewer than three stools per week and severe constipation as less than one stool per week.  One of the most common issues patients have following surgery is constipation.  Even if you have a regular bowel pattern at home, your normal regimen is likely to be disrupted due to multiple reasons following surgery.  Combination of anesthesia,  postoperative narcotics, change in appetite and fluid intake all can affect your bowels.  In order to avoid complications following surgery, here are some recommendations in order to help you during your recovery period.  Colace (docusate) - Pick up an over-the-counter form of Colace or another stool softener and take twice a day as long as you are requiring postoperative pain medications.  Take with a full glass of water daily.  If you experience loose stools or diarrhea, hold the colace until you stool forms back up.  If your symptoms do not get better within 1 week or if they get worse, check with your doctor.  Dulcolax (bisacodyl) - Pick up over-the-counter and take as directed by the product packaging as needed to assist with the movement of your bowels.  Take with a full glass of water.  Use this product as needed if not relieved by Colace only.   MiraLax (polyethylene glycol) - Pick up over-the-counter to have on hand.  MiraLax is a solution that will increase the amount of water in your bowels to assist with bowel movements.  Take as directed and can mix with a glass of water, juice, soda, coffee, or tea.  Take if you go more than two days without a movement. Do not use MiraLax more than once per day. Call your doctor if you are still constipated or irregular after using this medication for 7 days in a row.  If you continue to have problems with postoperative constipation, please contact the office for further assistance and recommendations.  If you experience "the worst abdominal pain ever" or develop nausea or vomiting, please contact the office immediatly for further recommendations for treatment.     Do not put a pillow under the knee. Place it under the heel.    Complete by:  As directed      Do not sit on low chairs, stoools or toilet seats, as it may be difficult to get up from low surfaces    Complete by:  As directed      Driving restrictions    Complete by:  As directed   No driving  until released by the physician.     Increase activity slowly as tolerated  Complete by:  As directed      Lifting restrictions    Complete by:  As directed   No lifting until released by the physician.     Patient may shower    Complete by:  As directed   You may shower without a dressing once there is no drainage.  Do not wash over the wound.  If drainage remains, do not shower until drainage stops.     TED hose    Complete by:  As directed   Use stockings (TED hose) for 3 weeks on both leg(s).  You may remove them at night for sleeping.     Weight bearing as tolerated    Complete by:  As directed   Laterality:  right  Extremity:  Lower            Medication List    STOP taking these medications        ACIDOPHILUS PROBIOTIC 100 MG Caps     CENTRUM SILVER PO     co-enzyme Q-10 50 MG capsule     cyanocobalamin 1000 MCG/ML injection  Commonly known as:  (VITAMIN B-12)     ESTER-C 500-60 MG Tabs     estradiol 2 MG vaginal ring  Commonly known as:  ESTRING     Fish Oil 1200 MG Caps     Flax Seed Oil 1000 MG Caps     Grape Seed Extract 100 MG Caps     meloxicam 15 MG tablet  Commonly known as:  MOBIC     mupirocin ointment 2 %  Commonly known as:  BACTROBAN     Selenium 200 MCG Caps      TAKE these medications        atenolol 25 MG tablet  Commonly known as:  TENORMIN  TAKE ONE TABLET BY MOUTH ONCE DAILY     betamethasone dipropionate 0.05 % cream  Commonly known as:  DIPROLENE  Apply 1 application topically 2 (two) times daily as needed.     CITRUCEL 500 MG Tabs  Generic drug:  Methylcellulose (Laxative)  Take 2 tablets by mouth daily after supper.     dorzolamide-timolol 22.3-6.8 MG/ML ophthalmic solution  Commonly known as:  COSOPT  Place 1 drop into both eyes 2 (two) times daily.     FLUoxetine 40 MG capsule  Commonly known as:  PROZAC  Take 1 capsule (40 mg total) by mouth daily.     glimepiride 4 MG tablet  Commonly known as:  AMARYL    Take 1 tablet (4 mg total) by mouth daily with breakfast.     HYDROcodone-acetaminophen 7.5-325 MG per tablet  Commonly known as:  NORCO  Take 1-2 tablets by mouth every 4 (four) hours as needed for moderate pain or severe pain.     lisinopril 10 MG tablet  Commonly known as:  PRINIVIL,ZESTRIL  Take 1 tablet (10 mg total) by mouth daily.     LUMIGAN 0.01 % Soln  Generic drug:  bimatoprost  Place 1 drop into both eyes at bedtime.     methocarbamol 500 MG tablet  Commonly known as:  ROBAXIN  Take 1 tablet (500 mg total) by mouth every 6 (six) hours as needed for muscle spasms.     pramipexole 0.5 MG tablet  Commonly known as:  MIRAPEX  TAKE ONE TABLET BY MOUTH AT BEDTIME     rivaroxaban 10 MG Tabs tablet  Commonly known as:  XARELTO  Take 1 tablet (10 mg total)  by mouth daily with breakfast. Take Xarelto for two and a half more weeks, then discontinue Xarelto. Once the patient has completed the blood thinner regimen, then take a Baby 81 mg Aspirin daily for three more weeks.     temazepam 30 MG capsule  Commonly known as:  RESTORIL  Take 1 capsule (30 mg total) by mouth at bedtime as needed for sleep.     traMADol 50 MG tablet  Commonly known as:  ULTRAM  Take 1-2 tablets (50-100 mg total) by mouth every 6 (six) hours as needed for moderate pain.     VICTOZA 18 MG/3ML Sopn  Generic drug:  Liraglutide  INJECT 1.$RemoveB'8MG'[id number]$  INTO THE SKIN DAILY           Follow-up Information    Follow up with [facility].   Why:  home health physical therapy   Contact information:   [address] [place] [address] [phone]       Follow up with [facility].   Why:  rolling walker   Contact information:   [address] [place] [address] [phone]       Follow up with [name], MD On 01/14/2015.   Specialty:  Orthopedic Surgery   Why:  Call office at [phone] to setup appointment on Tuesday 01/14/2015 with Dr. Wynelle Link.    Contact information:   [address] [place] [address] [phone]       Signed: Arlee Muslim, PA-C Orthopaedic Surgery 01/01/2015, 3:14 PM

## 2015-01-02 LAB — CBC
HEMATOCRIT: 30.4 % — AB (ref 36.0–46.0)
HEMOGLOBIN: 9.9 g/dL — AB (ref 12.0–15.0)
MCH: 30.4 pg (ref 26.0–34.0)
MCHC: 32.6 g/dL (ref 30.0–36.0)
MCV: 93.3 fL (ref 78.0–100.0)
Platelets: 211 10*3/uL (ref 150–400)
RBC: 3.26 MIL/uL — AB (ref 3.87–5.11)
RDW: 13.9 % (ref 11.5–15.5)
WBC: 7.9 10*3/uL (ref 4.0–10.5)

## 2015-01-02 LAB — GLUCOSE, CAPILLARY
GLUCOSE-CAPILLARY: 176 mg/dL — AB (ref 65–99)
Glucose-Capillary: 162 mg/dL — ABNORMAL HIGH (ref 65–99)

## 2015-01-02 MED ORDER — HYDROCODONE-ACETAMINOPHEN 7.5-325 MG PO TABS
1.0000 | ORAL_TABLET | ORAL | Status: DC | PRN
  Administered 2015-01-02: 1 via ORAL
  Filled 2015-01-02: qty 1

## 2015-01-02 NOTE — Plan of Care (Signed)
Problem: Phase III Progression Outcomes Goal: Discharge plan remains appropriate-arrangements made Outcome: Completed/Met Date Met:  01/02/15 . Goal: Anticoagulant follow-up in place Outcome: Not Applicable Date Met:  26/83/41 Xarelto VTE, no f/u needed.

## 2015-01-02 NOTE — Progress Notes (Signed)
Physical Therapy Treatment Note    01/01/15 1600  PT Visit Information  Last PT Received On 01/02/15  Assistance Needed +1  History of Present Illness s/p R TKA  PT Time Calculation  PT Start Time (ACUTE ONLY) 1403  PT Stop Time (ACUTE ONLY) 1425  PT Time Calculation (min) (ACUTE ONLY) 22 min  Subjective Data  Subjective Pt ambulated short distance and practiced safe stair technique.  Pt used KI due to 7/10 pain.  Precautions  Precautions Knee  Required Braces or Orthoses Knee Immobilizer - Right  Knee Immobilizer - Right Discontinue once straight leg raise with < 10 degree lag  Restrictions  Other Position/Activity Restrictions WBAT  Pain Assessment  Pain Assessment 0-10  Pain Score 7  Pain Location R knee  Pain Descriptors / Indicators Sore  Pain Intervention(s) Limited activity within patient's tolerance;Monitored during session;Premedicated before session;Repositioned;Ice applied  Cognition  Arousal/Alertness Awake/alert  Behavior During Therapy WFL for tasks assessed/performed  Overall Cognitive Status Within Functional Limits for tasks assessed  Bed Mobility  General bed mobility comments pt in recliner on arrival  Transfers  Overall transfer level Needs assistance  Equipment used Rolling walker (2 wheeled)  Transfers Sit to/from Stand  Sit to Stand Min guard  General transfer comment verbal cues for UE and LE positioning  Ambulation/Gait  Ambulation/Gait assistance Min guard  Ambulation Distance (Feet) 40 Feet  Assistive device Rolling walker (2 wheeled)  Gait Pattern/deviations Step-to pattern;Antalgic  General Gait Details verbal cues for short steps, RW positioning; distance limited due to pain  Stairs Yes  Stairs assistance Min guard  Stair Management Step to pattern;Backwards;With walker  Number of Stairs 2  General stair comments verbal cues for sequence, safety, RW positioning, provided handout  PT - End of Session  Equipment Utilized During Treatment  Gait belt  Activity Tolerance Patient limited by pain  Patient left in chair;with call bell/phone within reach  PT - Assessment/Plan  PT Plan Current plan remains appropriate  PT Frequency (ACUTE ONLY) 7X/week  Follow Up Recommendations Home health PT  PT equipment Rolling walker with 5" wheels  PT Goal Progression  Progress towards PT goals Progressing toward goals  PT General Charges  $$ ACUTE PT VISIT 1 Procedure  PT Treatments  $Gait Training 8-22 mins   Carmelia Bake, PT, DPT 01/02/2015 Pager: [phone]  (Late entry due to EPIC downtime 8/24)

## 2015-01-02 NOTE — Progress Notes (Signed)
Physical Therapy Treatment Note    01/02/15 1300  PT Visit Information  Last PT Received On 01/02/15  Assistance Needed +1  History of Present Illness s/p R TKA  PT Time Calculation  PT Start Time (ACUTE ONLY) 1130  PT Stop Time (ACUTE ONLY) 1144  PT Time Calculation (min) (ACUTE ONLY) 14 min  Subjective Data  Subjective Pt reports spouse to arrive after lunch so performed LE exercises this morning and will return when spouse available to observe ambulation and steps.  Precautions  Precautions Knee  Required Braces or Orthoses Knee Immobilizer - Right  Knee Immobilizer - Right Discontinue once straight leg raise with < 10 degree lag  Restrictions  Other Position/Activity Restrictions WBAT  Pain Assessment  Pain Assessment 0-10  Pain Score 3  Pain Location R knee  Pain Descriptors / Indicators Sore  Pain Intervention(s) Limited activity within patient's tolerance;Ice applied  Cognition  Arousal/Alertness Awake/alert  Behavior During Therapy WFL for tasks assessed/performed  Overall Cognitive Status Within Functional Limits for tasks assessed  Exercises  Exercises Total Joint  Total Joint Exercises  Ankle Circles/Pumps AROM;Both;15 reps  Quad Sets AROM;Both;15 reps  Heel Slides AAROM;Right;15 reps;Supine  Short Arc Quad AAROM;Right;15 reps  Hip ABduction/ADduction AROM;Right;15 reps  Straight Leg Raises AAROM;Right;10 reps  Towel Squeeze AROM;Both;15 reps  Goniometric ROM approx 70* AAROM knee flexion during heel slides  PT - End of Session  Activity Tolerance Patient tolerated treatment well  Patient left in chair;with call bell/phone within reach  PT - Assessment/Plan  PT Plan Current plan remains appropriate  PT Frequency (ACUTE ONLY) 7X/week  Follow Up Recommendations Home health PT  PT equipment Rolling walker with 5" wheels  PT Goal Progression  Progress towards PT goals Progressing toward goals  PT General Charges  $$ ACUTE PT VISIT 1 Procedure  PT Treatments   $Therapeutic Exercise 8-22 mins   Carmelia Bake, PT, DPT 01/02/2015 Pager: [phone]

## 2015-01-02 NOTE — Progress Notes (Signed)
Occupational Therapy Treatment Patient Details Name: AZADEH HYDER MRN: [id number] DOB: 12-01-1940 Today's Date: 01/02/2015    History of present illness s/p R TKA   OT comments  Did much better than yesterday. Will not need follow up OT  Follow Up Recommendations  No OT follow up;Supervision/Assistance - 24 hour    Equipment Recommendations  3 in 1 bedside comode (delivered)    Recommendations for Other Services      Precautions / Restrictions Precautions Precautions: Knee Required Braces or Orthoses: Knee Immobilizer - Right Knee Immobilizer - Right: Discontinue once straight leg raise with < 10 degree lag Restrictions Weight Bearing Restrictions: No Other Position/Activity Restrictions: WBAT       Mobility Bed Mobility               General bed mobility comments: oob  Transfers   Equipment used: Rolling walker (2 wheeled) Transfers: Sit to/from Stand Sit to Stand: Min guard;Min assist         General transfer comment: min A from recliner    Balance                                   ADL       Grooming: Oral care;Supervision/safety;Standing                   Toilet Transfer: Min guard;Minimal assistance;BSC;RW             General ADL Comments: ambulated to bathroom wtih min guard, cues for walker distance.  Stood from recliner with min A and from 3:1 with min guard assistance.        Vision                     Perception     Praxis      Cognition   Behavior During Therapy: WFL for tasks assessed/performed Overall Cognitive Status: Within Functional Limits for tasks assessed                       Extremity/Trunk Assessment               Exercises     Shoulder Instructions       General Comments      Pertinent Vitals/ Pain       Pain Score: 2  Pain Location: R knee Pain Descriptors / Indicators: Sore Pain Intervention(s): Limited activity within patient's tolerance  Home  Living                                          Prior Functioning/Environment              Frequency       Progress Toward Goals  OT Goals(current goals can now be found in the care plan section)  Progress towards OT goals: Progressing toward goals (no further OT is needed)     Plan      Co-evaluation                 End of Session CPM Right Knee CPM Right Knee: Off   Activity Tolerance Patient tolerated treatment well   Patient Left in chair;with call bell/phone within reach;with family/visitor present   Nurse Communication          Time:  4621-9471 OT Time Calculation (min): 22 min  Charges: OT General Charges $OT Visit: 1 Procedure OT Treatments $Self Care/Home Management : 8-22 mins  Merridy Pascoe 01/02/2015, 10:04 AM  Lesle Chris, OTR/L [phone] 01/02/2015

## 2015-01-02 NOTE — Progress Notes (Signed)
Physical Therapy Treatment Note    01/02/15 1400  PT Visit Information  Last PT Received On 01/02/15  Assistance Needed +1  History of Present Illness s/p R TKA  PT Time Calculation  PT Start Time (ACUTE ONLY) 1332  PT Stop Time (ACUTE ONLY) 1345  PT Time Calculation (min) (ACUTE ONLY) 13 min  Subjective Data  Subjective Pt ambulated short distance and practiced safe stair technique with spouse present and assisting with holding RW.  Pt used KI due to 7/10 pain and unable to perform SLR today.  Pt feels ready to d/c home and spouse and pt had no further questions.  Precautions  Precautions Knee  Required Braces or Orthoses Knee Immobilizer - Right  Knee Immobilizer - Right Discontinue once straight leg raise with < 10 degree lag  Restrictions  Other Position/Activity Restrictions WBAT  Pain Assessment  Pain Assessment 0-10  Pain Score 7  Pain Location R knee  Pain Descriptors / Indicators Sore  Pain Intervention(s) Limited activity within patient's tolerance;Monitored during session;Repositioned;Patient requesting pain meds-RN notified  Cognition  Arousal/Alertness Awake/alert  Behavior During Therapy WFL for tasks assessed/performed  Overall Cognitive Status Within Functional Limits for tasks assessed  Bed Mobility  General bed mobility comments pt in recliner on arrival  Transfers  Overall transfer level Needs assistance  Equipment used Rolling walker (2 wheeled)  Sit to Stand Supervision  General transfer comment verbal cues for UE and LE positioning  Ambulation/Gait  Ambulation/Gait assistance Min guard;Supervision  Ambulation Distance (Feet) 25 Feet  Assistive device Rolling walker (2 wheeled)  General Gait Details verbal cues for short steps, RW positioning; distance limited due to pain and to conserve energy for getting home later  Stairs Yes  Stairs assistance Min guard  Stair Management Backwards;Step to pattern;With walker  Number of Stairs 2  General stair  comments verbal cues for sequence, safety, RW positioning, spouse present and educated  PT - End of Session  Activity Tolerance Patient limited by pain  Patient left in chair;with call bell/phone within reach  PT - Assessment/Plan  PT Plan Current plan remains appropriate  PT Frequency (ACUTE ONLY) 7X/week  Follow Up Recommendations Home health PT  PT equipment Rolling walker with 5" wheels  PT Goal Progression  Progress towards PT goals Progressing toward goals  PT General Charges  $$ ACUTE PT VISIT 1 Procedure  PT Treatments  $Gait Training 8-22 mins   Carmelia Bake, PT, DPT 01/02/2015 Pager: [phone]

## 2015-01-02 NOTE — Progress Notes (Signed)
Subjective: 3 Days Post-Op Procedure(s) (LRB): RIGHT TOTAL KNEE ARTHROPLASTY (Right) Patient reports pain as mild.   Patient seen in rounds for Dr. Wynelle Link. Patient is well, but has had some minor complaints of pain in the knee, requiring pain medications Patient is ready to go home later today.  Feels much better after the one day.  Objective: Vital signs in last 24 hours: Temp:  [98.4 F (36.9 C)-99 F (37.2 C)] 98.9 F (37.2 C) (08/25 0639) Pulse Rate:  [74-92] 92 (08/25 0639) Resp:  [16-18] 18 (08/25 0639) BP: (120-152)/(58-67) 152/67 mmHg (08/25 0639) SpO2:  [94 %-97 %] 94 % (08/25 0639)  Intake/Output from previous day:  Intake/Output Summary (Last 24 hours) at 01/02/15 0926 Last data filed at 01/02/15 0640  Gross per 24 hour  Intake   1160 ml  Output      0 ml  Net   1160 ml     Labs:  Recent Labs  12/31/14 0500 01/01/15 0423 01/02/15 0436  HGB 10.6* 10.4* 9.9*    Recent Labs  01/01/15 0423 01/02/15 0436  WBC 11.5* 7.9  RBC 3.45* 3.26*  HCT 32.2* 30.4*  PLT 214 211    Recent Labs  12/31/14 0500 01/01/15 0423  NA 136 137  K 4.4 3.8  CL 101 100*  CO2 31 32  BUN 13 16  CREATININE 0.53 0.51  GLUCOSE 164* 151*  CALCIUM 8.4* 8.7*   No results for input(s): LABPT, INR in the last 72 hours.  EXAM: General - Patient is Alert, Appropriate and Oriented Extremity - Neurovascular intact Sensation intact distally Dorsiflexion/Plantar flexion intact Incision - clean, dry, no drainage Motor Function - intact, moving foot and toes well on exam.   Assessment/Plan: 3 Days Post-Op Procedure(s) (LRB): RIGHT TOTAL KNEE ARTHROPLASTY (Right) Procedure(s) (LRB): RIGHT TOTAL KNEE ARTHROPLASTY (Right) Past Medical History  Diagnosis Date  . 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 Neuropathy   . Diverticulitis   . Headache(784.0)     Migraines  . Hammer toe   . PONV (postoperative nausea and vomiting)   .  Hypertension     takes Atenolol daily  . Joint pain   . Arthritis     right knee;injections every 10months   . Scoliosis   . IBS (irritable bowel syndrome)   . Vitamin D deficiency     takes Vit d daily  . Diabetes mellitus     takes Amaryl and Januvia daily  . Early cataracts, bilateral   . Glaucoma   . Endometriosis   . Gastritis   . Asthma   . COMPRESSION FRACTURE, LUMBAR VERTEBRAE 08/21/2008    Qualifier: Diagnosis of  By: Arnoldo Morale MD, Balinda Quails   . Kidney stone   . Cancer     HX BREAST CANCER  . Osteopenia   . Posterior tibial tendon dysfunction     left foot  . Eczema   . Osteomyelitis     left 2nd toe  . Difficulty sleeping   . Depression   . Open wound of second toe of left foot    Principal Problem:   OA (osteoarthritis) of knee  Estimated body mass index is 33.22 kg/(m^2) as calculated from the following:   Height as of this encounter: 6' (1.829 m).   Weight as of this encounter: 111.131 kg (245 lb). Up with therapy Discharge home with home health Diet - Cardiac diet and Diabetic diet Follow up - in  2 weeks Activity - WBAT Disposition - Home Condition Upon Discharge - Good D/C Meds - See DC Summary DVT Prophylaxis - Xarelto  Arlee Muslim, PA-C Orthopaedic Surgery 01/02/2015, 9:26 AM

## 2015-01-03 DIAGNOSIS — M7122 Synovial cyst of popliteal space [Baker], left knee: Secondary | ICD-10-CM | POA: Diagnosis not present

## 2015-01-03 DIAGNOSIS — I129 Hypertensive chronic kidney disease with stage 1 through stage 4 chronic kidney disease, or unspecified chronic kidney disease: Secondary | ICD-10-CM | POA: Diagnosis not present

## 2015-01-03 DIAGNOSIS — E119 Type 2 diabetes mellitus without complications: Secondary | ICD-10-CM | POA: Diagnosis not present

## 2015-01-03 DIAGNOSIS — G629 Polyneuropathy, unspecified: Secondary | ICD-10-CM | POA: Diagnosis not present

## 2015-01-03 DIAGNOSIS — Z471 Aftercare following joint replacement surgery: Secondary | ICD-10-CM | POA: Diagnosis not present

## 2015-01-03 DIAGNOSIS — M1712 Unilateral primary osteoarthritis, left knee: Secondary | ICD-10-CM | POA: Diagnosis not present

## 2015-01-06 DIAGNOSIS — G629 Polyneuropathy, unspecified: Secondary | ICD-10-CM | POA: Diagnosis not present

## 2015-01-06 DIAGNOSIS — M7122 Synovial cyst of popliteal space [Baker], left knee: Secondary | ICD-10-CM | POA: Diagnosis not present

## 2015-01-06 DIAGNOSIS — M1712 Unilateral primary osteoarthritis, left knee: Secondary | ICD-10-CM | POA: Diagnosis not present

## 2015-01-06 DIAGNOSIS — Z471 Aftercare following joint replacement surgery: Secondary | ICD-10-CM | POA: Diagnosis not present

## 2015-01-06 DIAGNOSIS — I129 Hypertensive chronic kidney disease with stage 1 through stage 4 chronic kidney disease, or unspecified chronic kidney disease: Secondary | ICD-10-CM | POA: Diagnosis not present

## 2015-01-06 DIAGNOSIS — E119 Type 2 diabetes mellitus without complications: Secondary | ICD-10-CM | POA: Diagnosis not present

## 2015-01-09 DIAGNOSIS — G629 Polyneuropathy, unspecified: Secondary | ICD-10-CM | POA: Diagnosis not present

## 2015-01-09 DIAGNOSIS — Z471 Aftercare following joint replacement surgery: Secondary | ICD-10-CM | POA: Diagnosis not present

## 2015-01-09 DIAGNOSIS — M1712 Unilateral primary osteoarthritis, left knee: Secondary | ICD-10-CM | POA: Diagnosis not present

## 2015-01-09 DIAGNOSIS — E119 Type 2 diabetes mellitus without complications: Secondary | ICD-10-CM | POA: Diagnosis not present

## 2015-01-09 DIAGNOSIS — I129 Hypertensive chronic kidney disease with stage 1 through stage 4 chronic kidney disease, or unspecified chronic kidney disease: Secondary | ICD-10-CM | POA: Diagnosis not present

## 2015-01-09 DIAGNOSIS — M7122 Synovial cyst of popliteal space [Baker], left knee: Secondary | ICD-10-CM | POA: Diagnosis not present

## 2015-01-10 DIAGNOSIS — M1712 Unilateral primary osteoarthritis, left knee: Secondary | ICD-10-CM | POA: Diagnosis not present

## 2015-01-10 DIAGNOSIS — M7122 Synovial cyst of popliteal space [Baker], left knee: Secondary | ICD-10-CM | POA: Diagnosis not present

## 2015-01-10 DIAGNOSIS — E119 Type 2 diabetes mellitus without complications: Secondary | ICD-10-CM | POA: Diagnosis not present

## 2015-01-10 DIAGNOSIS — I129 Hypertensive chronic kidney disease with stage 1 through stage 4 chronic kidney disease, or unspecified chronic kidney disease: Secondary | ICD-10-CM | POA: Diagnosis not present

## 2015-01-10 DIAGNOSIS — G629 Polyneuropathy, unspecified: Secondary | ICD-10-CM | POA: Diagnosis not present

## 2015-01-10 DIAGNOSIS — Z471 Aftercare following joint replacement surgery: Secondary | ICD-10-CM | POA: Diagnosis not present

## 2015-01-13 DIAGNOSIS — G629 Polyneuropathy, unspecified: Secondary | ICD-10-CM | POA: Diagnosis not present

## 2015-01-13 DIAGNOSIS — I129 Hypertensive chronic kidney disease with stage 1 through stage 4 chronic kidney disease, or unspecified chronic kidney disease: Secondary | ICD-10-CM | POA: Diagnosis not present

## 2015-01-13 DIAGNOSIS — E119 Type 2 diabetes mellitus without complications: Secondary | ICD-10-CM | POA: Diagnosis not present

## 2015-01-13 DIAGNOSIS — Z471 Aftercare following joint replacement surgery: Secondary | ICD-10-CM | POA: Diagnosis not present

## 2015-01-13 DIAGNOSIS — M1712 Unilateral primary osteoarthritis, left knee: Secondary | ICD-10-CM | POA: Diagnosis not present

## 2015-01-13 DIAGNOSIS — M7122 Synovial cyst of popliteal space [Baker], left knee: Secondary | ICD-10-CM | POA: Diagnosis not present

## 2015-01-14 DIAGNOSIS — Z471 Aftercare following joint replacement surgery: Secondary | ICD-10-CM | POA: Diagnosis not present

## 2015-01-14 DIAGNOSIS — Z96651 Presence of right artificial knee joint: Secondary | ICD-10-CM | POA: Diagnosis not present

## 2015-01-14 DIAGNOSIS — M1711 Unilateral primary osteoarthritis, right knee: Secondary | ICD-10-CM | POA: Diagnosis not present

## 2015-01-15 ENCOUNTER — Ambulatory Visit: Payer: Medicare Other | Admitting: Family Medicine

## 2015-01-16 DIAGNOSIS — Z471 Aftercare following joint replacement surgery: Secondary | ICD-10-CM | POA: Diagnosis not present

## 2015-01-16 DIAGNOSIS — Z96651 Presence of right artificial knee joint: Secondary | ICD-10-CM | POA: Diagnosis not present

## 2015-01-20 ENCOUNTER — Other Ambulatory Visit: Payer: Self-pay | Admitting: Family Medicine

## 2015-01-23 DIAGNOSIS — M1711 Unilateral primary osteoarthritis, right knee: Secondary | ICD-10-CM | POA: Diagnosis not present

## 2015-01-25 ENCOUNTER — Other Ambulatory Visit: Payer: Self-pay | Admitting: Family Medicine

## 2015-02-03 DIAGNOSIS — I739 Peripheral vascular disease, unspecified: Secondary | ICD-10-CM | POA: Diagnosis not present

## 2015-02-03 DIAGNOSIS — E1151 Type 2 diabetes mellitus with diabetic peripheral angiopathy without gangrene: Secondary | ICD-10-CM | POA: Diagnosis not present

## 2015-02-03 DIAGNOSIS — L603 Nail dystrophy: Secondary | ICD-10-CM | POA: Diagnosis not present

## 2015-02-04 DIAGNOSIS — Z471 Aftercare following joint replacement surgery: Secondary | ICD-10-CM | POA: Diagnosis not present

## 2015-02-04 DIAGNOSIS — Z96651 Presence of right artificial knee joint: Secondary | ICD-10-CM | POA: Diagnosis not present

## 2015-02-10 ENCOUNTER — Encounter: Payer: Self-pay | Admitting: Family Medicine

## 2015-02-10 MED ORDER — CIPROFLOXACIN HCL 500 MG PO TABS: 500.0000 mg | ORAL_TABLET | Freq: Two times a day (BID) | ORAL | Status: DC

## 2015-02-10 MED ORDER — METRONIDAZOLE 500 MG PO TABS: 500.0000 mg | ORAL_TABLET | Freq: Three times a day (TID) | ORAL | Status: DC

## 2015-02-10 NOTE — Telephone Encounter (Signed)
Patient with symptoms by phone consistent with diverticulitis (fever now resolved if on tylenol but with abdominal pain consistent with prior episodes). Advised patient to see me at this time. She states would be incredibly difficult with her knee. I agreed to cipro/flagyl but if no improvement within 48 hours or worsening needs to be seen. Also advised 10 day follow up to see if need to extend course.

## 2015-02-13 DIAGNOSIS — D518 Other vitamin B12 deficiency anemias: Secondary | ICD-10-CM | POA: Diagnosis not present

## 2015-02-14 ENCOUNTER — Ambulatory Visit (INDEPENDENT_AMBULATORY_CARE_PROVIDER_SITE_OTHER): Payer: Medicare Other | Admitting: Family Medicine

## 2015-02-14 ENCOUNTER — Encounter: Payer: Self-pay | Admitting: Family Medicine

## 2015-02-14 ENCOUNTER — Telehealth: Payer: Self-pay | Admitting: Family Medicine

## 2015-02-14 VITALS — BP 140/78 | HR 83 | Temp 98.4°F

## 2015-02-14 DIAGNOSIS — K5732 Diverticulitis of large intestine without perforation or abscess without bleeding: Secondary | ICD-10-CM

## 2015-02-14 DIAGNOSIS — R197 Diarrhea, unspecified: Secondary | ICD-10-CM

## 2015-02-14 NOTE — Telephone Encounter (Signed)
Patient Name: [name]  DOB: 06-26-1940    Initial Comment Caller states she has been on cipro and flagel since Monday, now having watery diarrhea.   Nurse Assessment  Nurse: Verlin Fester RN, [facility]ney Date/Time Eilene Ghazi Time): 02/14/2015 12:59:25 PM  Confirm and document reason for call. If symptomatic, describe symptoms. ---Patient states she has diverticulitis and she is taking Flagyl and Cipro since Monday and she is having watery diarrhea X 6-7 today.  Has the patient traveled out of the country within the last 30 days? ---No  Does the patient have any new or worsening symptoms? ---Yes  Will a triage be completed? ---Yes  Related visit to physician within the last 2 weeks? ---No  Does the PT have any chronic conditions? (i.e. diabetes, asthma, etc.) ---Yes  List chronic conditions. ---"diabetes, HTN, recent knee replacement     Guidelines    Guideline Title Affirmed Question Affirmed Notes  Diarrhea [1] SEVERE diarrhea (e.g., 7 or more times / day more than normal) AND [2] age > 60 years    Final Disposition User   See Physician within 4 Hours (or PCP triage) Verlin Fester, RN, [facility]ney    Comments  Unable to schedule appointment with Dr. Yong Channel for this afternoon. Patient refused to go to [facility] and states she will only see Dr. Yong Channel. Instructed her someone from the office will call her back   Referrals  REFERRED TO PCP OFFICE   Disagree/Comply: Disagree  Disagree/Comply Reason: Disagree with instructions

## 2015-02-14 NOTE — Telephone Encounter (Signed)
Mrs. Malachy Mood per Dr. Yong Channel can you put pt on schedule and have her come in today, he will work her in but she may have to wait a few minutes.

## 2015-02-14 NOTE — Patient Instructions (Addendum)
I think this is antibiotic related diarrhea  Diverticulitis seems drastically improved but still want you to get 7 days worth so take through Sunday evening  If you are still having diarrhea by next Thursday, then we will likely get stool studies a tthat time  imodium is ok to take (not more than 3 in 24 hours)   If you have fever or increasing diarrhea please still call before thursday

## 2015-02-14 NOTE — Telephone Encounter (Signed)
Pt coming in at 4:15 today to see Dr. Yong Channel per MRs. Malachy Mood

## 2015-02-14 NOTE — Progress Notes (Signed)
[name], MD  Subjective:  [name] is a 74 y.o. year old very pleasant female patient who presents with:  Diverticulitis/diarrhea -Started with epigastric abdominal pain and fever up to 103, typical of prior transverse  Colon diverticulitis that she had been treated for and responded to previously. Pain was 8/10 cramping feeling. Within a day of antibiotics- cipro/flagyl- fever broke completely. Within a few days pain had completely resolved. Last night started with watery diarrhea 6-7 times. Took immodium once earlier today and has not had any more diarrhea. Eating soups/watery food. States last food before diarrhea was yogurt. Not probiotic yogurt.   ROS- No blood or mucus in stool. No longer with fever, chills, nausea, vomiting. No chest pain or shortness of breath.   Past Medical History-  Patient Active Problem List   Diagnosis Date Noted  . Diabetic ulcer of left foot associated with type 2 diabetes mellitus ([place]) 03/13/2014    Priority: High  . Diabetes mellitus type II, controlled ([place]) 12/28/2006    Priority: High  . IBS (irritable bowel syndrome) 07/17/2014    Priority: Medium  . Depression 01/03/2014    Priority: Medium  . Essential hypertension, benign 01/03/2014    Priority: Medium  . Fatty liver 03/18/2009    Priority: Medium  . Hyperlipidemia 07/26/2007    Priority: Medium  . ANEMIA, B12 DEFICIENCY 12/29/2006    Priority: Medium  . RESTLESS LEG SYNDROME, MILD 12/28/2006    Priority: Medium  . OSTEOPOROSIS 12/12/2006    Priority: Medium  . CKD (chronic kidney disease), stage II 01/22/2014    Priority: Low  . Posterior tibial tendon dysfunction 01/03/2014    Priority: Low  . Osteoarthritis, knee 01/03/2014    Priority: Low  . Glaucoma 01/03/2014    Priority: Low  . Obesity (BMI 30-39.9) 06/15/2013    Priority: Low  . Foot tendinitis 07/20/2010    Priority: Low  . INSOMNIA, CHRONIC 07/25/2009    Priority: Low  . GERD 12/25/2007    Priority:  Low  . NEUROPATHY, IDIOPATHIC PERIPHERAL NEC 12/28/2006    Priority: Low  . BREAST CANCER, HX OF 12/12/2006    Priority: Low  . OA (osteoarthritis) of knee 12/30/2014    Medications- reviewed and updated Current Outpatient Prescriptions  Medication Sig Dispense Refill  . atenolol (TENORMIN) 25 MG tablet TAKE ONE TABLET BY MOUTH ONCE DAILY 90 tablet 3  . betamethasone dipropionate (DIPROLENE) 0.05 % cream Apply 1 application topically 2 (two) times daily as needed.    . dorzolamide-timolol (COSOPT) 22.3-6.8 MG/ML ophthalmic solution Place 1 drop into both eyes 2 (two) times daily.     [name] FLUoxetine (PROZAC) 40 MG capsule Take 1 capsule (40 mg total) by mouth daily. 90 capsule 3  . glimepiride (AMARYL) 4 MG tablet TAKE ONE TABLET BY MOUTH DAILY WITH BREAKFAST. 90 tablet 3  . lisinopril (PRINIVIL,ZESTRIL) 10 MG tablet TAKE ONE TABLET BY MOUTH DAILY 90 tablet 3  . LUMIGAN 0.01 % SOLN Place 1 drop into both eyes at bedtime.     . methocarbamol (ROBAXIN) 500 MG tablet Take 1 tablet (500 mg total) by mouth every 6 (six) hours as needed for muscle spasms. 80 tablet 0  . Methylcellulose, Laxative, (CITRUCEL) 500 MG TABS Take 2 tablets by mouth daily after supper.     . pramipexole (MIRAPEX) 0.5 MG tablet TAKE ONE TABLET BY MOUTH AT BEDTIME 90 tablet 3  . rivaroxaban (XARELTO) 10 MG TABS tablet Take 1 tablet (10 mg total) by mouth  daily with breakfast. Take Xarelto for two and a half more weeks, then discontinue Xarelto. Once the patient has completed the blood thinner regimen, then take a Baby 81 mg Aspirin daily for three more weeks. 19 tablet 0  . VICTOZA 18 MG/3ML SOPN INJECT 1.8 MG INTO THE SKIN DAILY 9 pen 6  . ciprofloxacin (CIPRO) 500 MG tablet Take 1 tablet (500 mg total) by mouth 2 (two) times daily. 20 tablet 0  . HYDROcodone-acetaminophen (NORCO) 7.5-325 MG per tablet Take 1-2 tablets by mouth every 4 (four) hours as needed for moderate pain or severe pain. (Patient not taking: Reported on  02/14/2015) 80 tablet 0  . metroNIDAZOLE (FLAGYL) 500 MG tablet Take 1 tablet (500 mg total) by mouth 3 (three) times daily. 30 tablet 0  . temazepam (RESTORIL) 30 MG capsule Take 1 capsule (30 mg total) by mouth at bedtime as needed for sleep. (Patient not taking: Reported on 02/14/2015) 30 capsule 5  . traMADol (ULTRAM) 50 MG tablet Take 1-2 tablets (50-100 mg total) by mouth every 6 (six) hours as needed for moderate pain. (Patient not taking: Reported on 02/14/2015) 80 tablet 0     Objective: BP 140/78 mmHg  Pulse 83  Temp(Src) 98.4 F (36.9 C)  LMP 05/10/1992 Gen: NAD, resting comfortably Mucous membranes are moist. CV: RRR no murmurs rubs or gallops Lungs: CTAB no crackles, wheeze, rhonchi Abdomen: soft/nontender/nondistended/normal bowel sounds. No rebound or guarding.  Ext: no edema Skin: warm, dry, no rash over abdomen Neuro: grossly normal, moves all extremities  Assessment/Plan:  Diverticulitis/diarrhea Diverticulitis is resolving but would prefer 7 days of treatment at least still. Likely antibiotic associated diarrhea. Given on flagyl doubt this is C. Diff. Patient will finish week course through Sunday of abx and if continued symptoms by Thursday of diarrhea alone, get stool studies at that time- patient to call to update me. Advised consider probiotics.   Sooner Return precautions advised.

## 2015-02-17 DIAGNOSIS — L97529 Non-pressure chronic ulcer of other part of left foot with unspecified severity: Secondary | ICD-10-CM | POA: Diagnosis not present

## 2015-02-17 DIAGNOSIS — M79675 Pain in left toe(s): Secondary | ICD-10-CM | POA: Diagnosis not present

## 2015-03-04 DIAGNOSIS — M25561 Pain in right knee: Secondary | ICD-10-CM | POA: Diagnosis not present

## 2015-03-04 DIAGNOSIS — M179 Osteoarthritis of knee, unspecified: Secondary | ICD-10-CM | POA: Diagnosis not present

## 2015-03-05 DIAGNOSIS — E119 Type 2 diabetes mellitus without complications: Secondary | ICD-10-CM | POA: Diagnosis not present

## 2015-03-05 DIAGNOSIS — H25813 Combined forms of age-related cataract, bilateral: Secondary | ICD-10-CM | POA: Diagnosis not present

## 2015-03-05 DIAGNOSIS — H401130 Primary open-angle glaucoma, bilateral, stage unspecified: Secondary | ICD-10-CM | POA: Diagnosis not present

## 2015-03-06 DIAGNOSIS — M25561 Pain in right knee: Secondary | ICD-10-CM | POA: Diagnosis not present

## 2015-03-06 DIAGNOSIS — M179 Osteoarthritis of knee, unspecified: Secondary | ICD-10-CM | POA: Diagnosis not present

## 2015-03-10 DIAGNOSIS — M25561 Pain in right knee: Secondary | ICD-10-CM | POA: Diagnosis not present

## 2015-03-10 DIAGNOSIS — M179 Osteoarthritis of knee, unspecified: Secondary | ICD-10-CM | POA: Diagnosis not present

## 2015-03-11 ENCOUNTER — Encounter: Payer: Self-pay | Admitting: Internal Medicine

## 2015-03-11 DIAGNOSIS — Z96651 Presence of right artificial knee joint: Secondary | ICD-10-CM | POA: Diagnosis not present

## 2015-03-11 DIAGNOSIS — Z471 Aftercare following joint replacement surgery: Secondary | ICD-10-CM | POA: Diagnosis not present

## 2015-03-12 DIAGNOSIS — M179 Osteoarthritis of knee, unspecified: Secondary | ICD-10-CM | POA: Diagnosis not present

## 2015-03-12 DIAGNOSIS — M25561 Pain in right knee: Secondary | ICD-10-CM | POA: Diagnosis not present

## 2015-03-18 DIAGNOSIS — M25561 Pain in right knee: Secondary | ICD-10-CM | POA: Diagnosis not present

## 2015-03-18 DIAGNOSIS — M179 Osteoarthritis of knee, unspecified: Secondary | ICD-10-CM | POA: Diagnosis not present

## 2015-03-20 DIAGNOSIS — M179 Osteoarthritis of knee, unspecified: Secondary | ICD-10-CM | POA: Diagnosis not present

## 2015-03-20 DIAGNOSIS — M25561 Pain in right knee: Secondary | ICD-10-CM | POA: Diagnosis not present

## 2015-03-24 DIAGNOSIS — M179 Osteoarthritis of knee, unspecified: Secondary | ICD-10-CM | POA: Diagnosis not present

## 2015-03-24 DIAGNOSIS — M25561 Pain in right knee: Secondary | ICD-10-CM | POA: Diagnosis not present

## 2015-03-27 ENCOUNTER — Ambulatory Visit (INDEPENDENT_AMBULATORY_CARE_PROVIDER_SITE_OTHER): Payer: Medicare Other | Admitting: Family Medicine

## 2015-03-27 DIAGNOSIS — Z23 Encounter for immunization: Secondary | ICD-10-CM | POA: Diagnosis not present

## 2015-03-27 DIAGNOSIS — M179 Osteoarthritis of knee, unspecified: Secondary | ICD-10-CM | POA: Diagnosis not present

## 2015-03-27 DIAGNOSIS — M25561 Pain in right knee: Secondary | ICD-10-CM | POA: Diagnosis not present

## 2015-04-07 ENCOUNTER — Other Ambulatory Visit: Payer: Self-pay | Admitting: Family Medicine

## 2015-04-07 DIAGNOSIS — M25561 Pain in right knee: Secondary | ICD-10-CM | POA: Diagnosis not present

## 2015-04-07 DIAGNOSIS — M179 Osteoarthritis of knee, unspecified: Secondary | ICD-10-CM | POA: Diagnosis not present

## 2015-04-07 NOTE — Telephone Encounter (Signed)
Yes thanks 

## 2015-04-07 NOTE — Telephone Encounter (Signed)
Ok to refill Restoril?

## 2015-04-09 DIAGNOSIS — M25561 Pain in right knee: Secondary | ICD-10-CM | POA: Diagnosis not present

## 2015-04-09 DIAGNOSIS — M179 Osteoarthritis of knee, unspecified: Secondary | ICD-10-CM | POA: Diagnosis not present

## 2015-04-10 DIAGNOSIS — Z96651 Presence of right artificial knee joint: Secondary | ICD-10-CM | POA: Diagnosis not present

## 2015-04-10 DIAGNOSIS — Z471 Aftercare following joint replacement surgery: Secondary | ICD-10-CM | POA: Diagnosis not present

## 2015-04-10 DIAGNOSIS — M1712 Unilateral primary osteoarthritis, left knee: Secondary | ICD-10-CM | POA: Diagnosis not present

## 2015-04-21 ENCOUNTER — Other Ambulatory Visit: Payer: Self-pay | Admitting: Family Medicine

## 2015-04-21 DIAGNOSIS — M25561 Pain in right knee: Secondary | ICD-10-CM | POA: Diagnosis not present

## 2015-04-21 DIAGNOSIS — M179 Osteoarthritis of knee, unspecified: Secondary | ICD-10-CM | POA: Diagnosis not present

## 2015-04-23 DIAGNOSIS — M179 Osteoarthritis of knee, unspecified: Secondary | ICD-10-CM | POA: Diagnosis not present

## 2015-04-23 DIAGNOSIS — M25561 Pain in right knee: Secondary | ICD-10-CM | POA: Diagnosis not present

## 2015-04-28 DIAGNOSIS — D518 Other vitamin B12 deficiency anemias: Secondary | ICD-10-CM | POA: Diagnosis not present

## 2015-04-30 DIAGNOSIS — M179 Osteoarthritis of knee, unspecified: Secondary | ICD-10-CM | POA: Diagnosis not present

## 2015-04-30 DIAGNOSIS — M25561 Pain in right knee: Secondary | ICD-10-CM | POA: Diagnosis not present

## 2015-05-01 DIAGNOSIS — L97529 Non-pressure chronic ulcer of other part of left foot with unspecified severity: Secondary | ICD-10-CM | POA: Diagnosis not present

## 2015-05-01 DIAGNOSIS — I739 Peripheral vascular disease, unspecified: Secondary | ICD-10-CM | POA: Diagnosis not present

## 2015-05-01 DIAGNOSIS — L603 Nail dystrophy: Secondary | ICD-10-CM | POA: Diagnosis not present

## 2015-05-01 DIAGNOSIS — E1151 Type 2 diabetes mellitus with diabetic peripheral angiopathy without gangrene: Secondary | ICD-10-CM | POA: Diagnosis not present

## 2015-05-05 ENCOUNTER — Other Ambulatory Visit: Payer: Self-pay | Admitting: Family Medicine

## 2015-05-06 NOTE — Telephone Encounter (Signed)
Yes thanks 

## 2015-05-06 NOTE — Telephone Encounter (Signed)
Refill ok? 

## 2015-05-07 ENCOUNTER — Encounter: Payer: Self-pay | Admitting: *Deleted

## 2015-05-07 ENCOUNTER — Encounter: Payer: Self-pay | Admitting: Family Medicine

## 2015-05-08 DIAGNOSIS — M25561 Pain in right knee: Secondary | ICD-10-CM | POA: Diagnosis not present

## 2015-05-08 DIAGNOSIS — M179 Osteoarthritis of knee, unspecified: Secondary | ICD-10-CM | POA: Diagnosis not present

## 2015-05-14 ENCOUNTER — Other Ambulatory Visit: Payer: Self-pay | Admitting: Obstetrics & Gynecology

## 2015-05-14 NOTE — Telephone Encounter (Signed)
Medication refill request: Estring  Last OV: 12-27-14  Next AEX: 05-20-15  Last MMG (if hormonal medication request):?  Refill authorized: please advise

## 2015-05-15 DIAGNOSIS — M179 Osteoarthritis of knee, unspecified: Secondary | ICD-10-CM | POA: Diagnosis not present

## 2015-05-15 DIAGNOSIS — M25561 Pain in right knee: Secondary | ICD-10-CM | POA: Diagnosis not present

## 2015-05-20 ENCOUNTER — Ambulatory Visit: Payer: Self-pay | Admitting: Obstetrics & Gynecology

## 2015-05-21 ENCOUNTER — Other Ambulatory Visit: Payer: Self-pay | Admitting: Family Medicine

## 2015-05-21 MED ORDER — CYANOCOBALAMIN 1000 MCG/ML IJ SOLN: 1000.0000 ug | INTRAMUSCULAR | Status: DC

## 2015-05-26 ENCOUNTER — Ambulatory Visit (INDEPENDENT_AMBULATORY_CARE_PROVIDER_SITE_OTHER)
Admission: RE | Admit: 2015-05-26 | Discharge: 2015-05-26 | Disposition: A | Discharge status: Home / Self Care | Payer: Medicare Other | Source: Ambulatory Visit | Attending: Family Medicine | Admitting: Family Medicine

## 2015-05-26 ENCOUNTER — Other Ambulatory Visit: Payer: Self-pay | Admitting: Family Medicine

## 2015-05-26 ENCOUNTER — Ambulatory Visit (INDEPENDENT_AMBULATORY_CARE_PROVIDER_SITE_OTHER): Payer: Medicare Other | Admitting: Family Medicine

## 2015-05-26 ENCOUNTER — Encounter: Payer: Self-pay | Admitting: Family Medicine

## 2015-05-26 VITALS — BP 122/60 | HR 101 | Temp 98.4°F

## 2015-05-26 DIAGNOSIS — R05 Cough: Secondary | ICD-10-CM

## 2015-05-26 DIAGNOSIS — J40 Bronchitis, not specified as acute or chronic: Secondary | ICD-10-CM

## 2015-05-26 DIAGNOSIS — R509 Fever, unspecified: Secondary | ICD-10-CM

## 2015-05-26 DIAGNOSIS — R059 Cough, unspecified: Secondary | ICD-10-CM

## 2015-05-26 MED ORDER — BENZONATATE 100 MG PO CAPS: 100.0000 mg | ORAL_CAPSULE | Freq: Three times a day (TID) | ORAL | Status: DC | PRN

## 2015-05-26 MED ORDER — ALBUTEROL SULFATE HFA 108 (90 BASE) MCG/ACT IN AERS: 2.0000 | INHALATION_SPRAY | Freq: Four times a day (QID) | RESPIRATORY_TRACT | Status: DC | PRN

## 2015-05-26 NOTE — Patient Instructions (Signed)
Suspect Bronchitis but rule out pneumonia with chest x-ray  Symptomatic care- albuterol for cough or wheeze Mucinex to help loosen secretions Tessalon pearls to help with cough  If fever gets above 101.5 or worsening shortness of breath, seek care immediately. I am still tempted to treat you with antibiotics even if negative for pneumonia but I worry for you in the long run if we use our antibiotics on a not clear infection (likely viral over bacterial)

## 2015-05-26 NOTE — Progress Notes (Signed)
PCP: [name], MD  Subjective:  [name] is a 75 y.o. year old very pleasant female patient who presents with concern for bonchitis with symptoms including cough, fatigue, fever at peak 100.5, nasal congestion -started: January 7th (day 10 today) - symptoms are worsening in regards to fatigue. Cough is largely stable. States has temperature 99 to up to 100.5 each night and not increasing in elevation. She states she is sore in her outer ribs from couhing so much -previous treatments: delsym (not helping) -sick contacts/travel/risks: denies flu exposure or other sick contacts   ROS-denies SOB (does endorse overall fatigue), NVD, tooth pain or sinus pressure  Pertinent Past Medical History- diabets controlled, depression, hypertension, hyperlipidemia, IBS, osteoporosis  Medications- reviewed  Current Outpatient Prescriptions  Medication Sig Dispense Refill  . atenolol (TENORMIN) 25 MG tablet TAKE ONE TABLET BY MOUTH ONCE DAILY 90 tablet 3  . betamethasone dipropionate (DIPROLENE) 0.05 % cream Apply 1 application topically 2 (two) times daily as needed.    . cyanocobalamin (,VITAMIN B-12,) 1000 MCG/ML injection Inject 1 mL (1,000 mcg total) into the muscle every 30 (thirty) days. 1 mL 11  . dorzolamide-timolol (COSOPT) 22.3-6.8 MG/ML ophthalmic solution Place 1 drop into both eyes 2 (two) times daily.     [name] ESTRING 2 MG vaginal ring INSERT 1 RING VAGINALLY EVERY 4 MONTHS 1 each 3  . FLUoxetine (PROZAC) 40 MG capsule TAKE ONE CAPSULE BY MOUTH DAILY 90 capsule 1  . glimepiride (AMARYL) 4 MG tablet TAKE ONE TABLET BY MOUTH DAILY WITH BREAKFAST. 90 tablet 3  . lisinopril (PRINIVIL,ZESTRIL) 10 MG tablet TAKE ONE TABLET BY MOUTH DAILY 90 tablet 3  . LUMIGAN 0.01 % SOLN Place 1 drop into both eyes at bedtime.     . meloxicam (MOBIC) 15 MG tablet TAKE ONE TABLET BY MOUTH ONCE DAILY 90 tablet 2  . Methylcellulose, Laxative, (CITRUCEL) 500 MG TABS Take 2 tablets by mouth daily after  supper.     . pramipexole (MIRAPEX) 0.5 MG tablet TAKE ONE TABLET BY MOUTH AT BEDTIME 90 tablet 3  . VICTOZA 18 MG/3ML SOPN INJECT 1.8 MG INTO THE SKIN DAILY 9 pen 6  . HYDROcodone-acetaminophen (NORCO) 7.5-325 MG per tablet Take 1-2 tablets by mouth every 4 (four) hours as needed for moderate pain or severe pain. (Patient not taking: Reported on 02/14/2015) 80 tablet 0  . methocarbamol (ROBAXIN) 500 MG tablet Take 1 tablet (500 mg total) by mouth every 6 (six) hours as needed for muscle spasms. (Patient not taking: Reported on 05/26/2015) 80 tablet 0  . temazepam (RESTORIL) 30 MG capsule TAKE ONE CAPSULE BY MOUTH AT BEDTIME AS NEEDED FOR SLEEP (Patient not taking: Reported on 05/26/2015) 30 capsule 5  . traMADol (ULTRAM) 50 MG tablet Take 1-2 tablets (50-100 mg total) by mouth every 6 (six) hours as needed for moderate pain. (Patient not taking: Reported on 02/14/2015) 80 tablet 0   Objective: BP 122/60 mmHg  Pulse 101  Temp(Src) 98.4 F (36.9 C)  Wt   SpO2 93%  LMP 05/10/1992 Gen: NAD, resting comfortably HEENT: Turbinatesmildly  erythematous, TM normal, pharynx mildly erythematous with no tonsilar exudate or edema, no sinus tenderness CV: RRR no murmurs rubs or gallops Lungs: diffuse rhonchi noted. No wheeze. No crackles or decreased breath sounds. No signs respiratory distress.  Abdomen: soft/nontender/nondistended/normal bowel sounds. No rebound or guarding.  Ext: no edema Skin: warm, dry, no rash Neuro: grossly normal, moves all extremities  Assessment/Plan:  Cough, fever Suspect bronchitis given breath  sounds, fatigue, continued cough at 10 days without improvement. Given fevers noted at home, get CXR to rule out pneumonia. On my exam patient ranges from 92-95% Pulse ox. She has apparently had similar illnesses in past almost on yearly basis and tended to recover from antibiotics. Stated chance of this being bacterial if no pneumonia is low but given overall clinical picture would be  willing to send in doxycycline if symptoms worsen or if are not more manageable with symptomatic care By Wednesday. If it is not- we discussed this would likely last from start 4-6 weeks unfortunately  Symptomatic treatment with: tessalon pearls, albuterol. We held off on prednisone with her diabetes.   Finally, we reviewed reasons to return to care including if symptoms worsen or persist or new concerns arise.  Meds ordered this encounter  Medications  . albuterol (PROVENTIL HFA;VENTOLIN HFA) 108 (90 Base) MCG/ACT inhaler    Sig: Inhale 2 puffs into the lungs every 6 (six) hours as needed for wheezing or shortness of breath.    Dispense:  1 Inhaler    Refill:  0  . benzonatate (TESSALON) 100 MG capsule    Sig: Take 1 capsule (100 mg total) by mouth 3 (three) times daily as needed for cough.    Dispense:  40 capsule    Refill:  0

## 2015-05-28 ENCOUNTER — Telehealth: Payer: Self-pay | Admitting: Family Medicine

## 2015-05-28 MED ORDER — GUAIFENESIN-CODEINE 100-10 MG/5ML PO SOLN: 5.0000 mL | Freq: Four times a day (QID) | ORAL | Status: DC | PRN

## 2015-05-28 MED ORDER — DOXYCYCLINE HYCLATE 100 MG PO TABS: 100.0000 mg | ORAL_TABLET | Freq: Two times a day (BID) | ORAL | Status: DC

## 2015-05-28 NOTE — Telephone Encounter (Signed)
Patient said the doctor wanted to know how she was feeling before he started seeing patients today.  She expressed that it looks like she is going to need an antibiotic because she has a temperature of 100 and she needs a cough syrup with codeine in it.

## 2015-05-28 NOTE — Telephone Encounter (Signed)
See below

## 2015-05-28 NOTE — Telephone Encounter (Signed)
Sent in doxycycline. Can you fax the codeine in? Make sure [facility] can fill that way. If not- she needs to come pick it up

## 2015-05-28 NOTE — Telephone Encounter (Signed)
Pt notified and Rx upfront for pick up.

## 2015-05-28 NOTE — Telephone Encounter (Signed)
Pt following up on request. [facility]/ [address]

## 2015-06-02 ENCOUNTER — Telehealth: Payer: Self-pay | Admitting: Obstetrics & Gynecology

## 2015-06-02 NOTE — Telephone Encounter (Signed)
Patient last here 12-2014 for Estring removal prior to bilateral knee replacement surgery. Patient requesting office visit for reinsertion with Dr Sabra Heck. Last office visit with Dr Sabra Heck 08-2014. Appointment scheduled for 06-06-15-at 1pm.  Routing to provider for final review. Patient agreeable to disposition. Will close encounter.

## 2015-06-02 NOTE — Telephone Encounter (Signed)
Patient wants to schedule an appointment to have Estring put in.

## 2015-06-03 ENCOUNTER — Encounter: Payer: Self-pay | Admitting: Family Medicine

## 2015-06-06 ENCOUNTER — Encounter: Payer: Self-pay | Admitting: Obstetrics & Gynecology

## 2015-06-06 ENCOUNTER — Ambulatory Visit (INDEPENDENT_AMBULATORY_CARE_PROVIDER_SITE_OTHER): Payer: Medicare Other | Admitting: Obstetrics & Gynecology

## 2015-06-06 VITALS — BP 132/70 | HR 92 | Resp 16 | Ht 72.0 in | Wt 245.0 lb

## 2015-06-06 DIAGNOSIS — B3731 Acute candidiasis of vulva and vagina: Secondary | ICD-10-CM

## 2015-06-06 DIAGNOSIS — N952 Postmenopausal atrophic vaginitis: Secondary | ICD-10-CM

## 2015-06-06 DIAGNOSIS — B373 Candidiasis of vulva and vagina: Secondary | ICD-10-CM

## 2015-06-06 MED ORDER — FLUCONAZOLE 150 MG PO TABS: 150.0000 mg | ORAL_TABLET | Freq: Once | ORAL | Status: DC

## 2015-06-06 MED ORDER — TERCONAZOLE 0.4 % VA CREA: 1.0000 | TOPICAL_CREAM | Freq: Every day | VAGINAL | Status: DC

## 2015-06-06 NOTE — Patient Instructions (Signed)
Leeroy Bock, office manager

## 2015-06-07 LAB — WET PREP BY MOLECULAR PROBE
CANDIDA SPECIES: POSITIVE — AB
GARDNERELLA VAGINALIS: POSITIVE — AB
Trichomonas vaginosis: NEGATIVE

## 2015-06-09 NOTE — Telephone Encounter (Signed)
Issue is no space this week then I am out of town until 14th. Bumping appointment up would not be by much unfortunately so i would just keep that slot

## 2015-06-10 ENCOUNTER — Encounter: Payer: Self-pay | Admitting: Family Medicine

## 2015-06-10 ENCOUNTER — Encounter: Payer: Self-pay | Admitting: Obstetrics & Gynecology

## 2015-06-11 ENCOUNTER — Ambulatory Visit (INDEPENDENT_AMBULATORY_CARE_PROVIDER_SITE_OTHER): Payer: Medicare Other | Admitting: Family Medicine

## 2015-06-11 ENCOUNTER — Encounter: Payer: Self-pay | Admitting: Family Medicine

## 2015-06-11 VITALS — BP 150/80 | HR 97 | Temp 98.9°F | Wt 245.0 lb

## 2015-06-11 DIAGNOSIS — J0101 Acute recurrent maxillary sinusitis: Secondary | ICD-10-CM

## 2015-06-11 MED ORDER — AMOXICILLIN-POT CLAVULANATE 875-125 MG PO TABS: 1.0000 | ORAL_TABLET | Freq: Two times a day (BID) | ORAL | Status: DC

## 2015-06-11 NOTE — Patient Instructions (Signed)
Bacterial Sinus infection possibly. Hard to tell at day 2-3 honestly and even more confusing in context of prior illness.   Antibiotic augmentin given. I want you to take this only if you have a fever over 100.5, symptoms worsen, or have not improved by next Wednesday  If symptoms recur after antibiotics- would advise you to be see again. See [name] for new symptoms other than those listed above

## 2015-06-11 NOTE — Progress Notes (Signed)
PCP: [name], MD  Subjective:  [name] is a 75 y.o. year old very pleasant female patient who presents with concern for bronchitis but also with sinusitis symptoms including nasal congestion, sinus tenderness.   1/16 and had 10 days of cough, congestion, shortness of breath. Treated on 05/28/15 with doxycycline for 7 days for bronchitis- we discussed high probability viral but has only had response to antibiotics in past reportedly. She did have significant improvement and Patient was only coughing up clear phlegm and this continued for 4-5 days then for 2 days has had worsening cough, more fatigue, coughing up  yellowish to greenish sputum with same nasal discharge. Denies reexposure to sick contact. Does spend a lot of time with 78 year old grandson. She does report more clearly this time that she had sinus congestion and pressure that had improved with prior doxycycline. Symptoms are now worsening daily.   -sick contacts/travel/risks: denies flu or sinusitis or URI exposure.   ROS-denies fever, SOB (no longer noting), NVD, tooth pain  Pertinent Past Medical History-  Patient Active Problem List   Diagnosis Date Noted  . Diabetic ulcer of left foot associated with type 2 diabetes mellitus ([place]) 03/13/2014    Priority: High  . Diabetes mellitus type II, controlled ([place]) 12/28/2006    Priority: High  . IBS (irritable bowel syndrome) 07/17/2014    Priority: Medium  . Depression 01/03/2014    Priority: Medium  . Essential hypertension, benign 01/03/2014    Priority: Medium  . Fatty liver 03/18/2009    Priority: Medium  . Hyperlipidemia 07/26/2007    Priority: Medium  . ANEMIA, B12 DEFICIENCY 12/29/2006    Priority: Medium  . RESTLESS LEG SYNDROME, MILD 12/28/2006    Priority: Medium  . OSTEOPOROSIS 12/12/2006    Priority: Medium  . CKD (chronic kidney disease), stage II 01/22/2014    Priority: Low  . Posterior tibial tendon dysfunction 01/03/2014    Priority: Low  .  Osteoarthritis, knee 01/03/2014    Priority: Low  . Glaucoma 01/03/2014    Priority: Low  . Obesity (BMI 30-39.9) 06/15/2013    Priority: Low  . Foot tendinitis 07/20/2010    Priority: Low  . INSOMNIA, CHRONIC 07/25/2009    Priority: Low  . GERD 12/25/2007    Priority: Low  . NEUROPATHY, IDIOPATHIC PERIPHERAL NEC 12/28/2006    Priority: Low  . BREAST CANCER, HX OF 12/12/2006    Priority: Low  . OA (osteoarthritis) of knee 12/30/2014    Medications- reviewed  Current Outpatient Prescriptions  Medication Sig Dispense Refill  . atenolol (TENORMIN) 25 MG tablet TAKE ONE TABLET BY MOUTH ONCE DAILY 90 tablet 3  . betamethasone dipropionate (DIPROLENE) 0.05 % cream Apply 1 application topically 2 (two) times daily as needed.    . cyanocobalamin (,VITAMIN B-12,) 1000 MCG/ML injection Inject 1 mL (1,000 mcg total) into the muscle every 30 (thirty) days. 1 mL 11  . dorzolamide-timolol (COSOPT) 22.3-6.8 MG/ML ophthalmic solution Place 1 drop into both eyes 2 (two) times daily.     [name] ESTRING 2 MG vaginal ring INSERT 1 RING VAGINALLY EVERY 4 MONTHS 1 each 3  . fluconazole (DIFLUCAN) 150 MG tablet Take 1 tablet (150 mg total) by mouth once. Take one tablet.  Repeat in 72 hours if symptoms are not completely resolved. 2 tablet 0  . FLUoxetine (PROZAC) 40 MG capsule TAKE ONE CAPSULE BY MOUTH DAILY 90 capsule 1  . glimepiride (AMARYL) 4 MG tablet TAKE ONE TABLET BY MOUTH  DAILY WITH BREAKFAST. 90 tablet 3  . lisinopril (PRINIVIL,ZESTRIL) 10 MG tablet TAKE ONE TABLET BY MOUTH DAILY 90 tablet 3  . LUMIGAN 0.01 % SOLN Place 1 drop into both eyes at bedtime.     . meloxicam (MOBIC) 15 MG tablet TAKE ONE TABLET BY MOUTH ONCE DAILY 90 tablet 2  . Methylcellulose, Laxative, (CITRUCEL) 500 MG TABS Take 2 tablets by mouth daily after supper.     . pramipexole (MIRAPEX) 0.5 MG tablet TAKE ONE TABLET BY MOUTH AT BEDTIME 90 tablet 3  . terconazole (TERAZOL 7) 0.4 % vaginal cream Place 1 applicator vaginally at  bedtime. 45 g 0  . VICTOZA 18 MG/3ML SOPN INJECT 1.8 MG INTO THE SKIN DAILY 9 pen 6  . albuterol (PROVENTIL HFA;VENTOLIN HFA) 108 (90 Base) MCG/ACT inhaler Inhale 2 puffs into the lungs every 6 (six) hours as needed for wheezing or shortness of breath. (Patient not taking: Reported on 06/11/2015) 1 Inhaler 0  . amoxicillin-clavulanate (AUGMENTIN) 875-125 MG tablet Take 1 tablet by mouth 2 (two) times daily. 14 tablet 0  . temazepam (RESTORIL) 30 MG capsule TAKE ONE CAPSULE BY MOUTH AT BEDTIME AS NEEDED FOR SLEEP (Patient not taking: Reported on 05/26/2015) 30 capsule 5   No current facility-administered medications for this visit.    Objective: BP 150/80 mmHg  Pulse 97  Temp(Src) 98.9 F (37.2 C)  Wt 245 lb (111.131 kg)  SpO2 95%  LMP 05/10/1992 Gen: NAD, resting comfortably HEENT: Turbinates erythematous with yellow drainage, TM normal, pharynx mildly erythematous with no tonsilar exudate or edema, maxillary sinus tenderness CV: RRR no murmurs rubs or gallops Lungs: CTAB no crackles, wheeze, rhonchi Abdomen: soft/nontender/nondistended/normal bowel sounds. Ext: no edema Skin: warm, dry, no rash Neuro: grossly normal, moves all extremities  Assessment/Plan:  Sinsusitis Bacterial based on: Symptoms >10 days, double sickening though it is tough to tell if this could be a new infection entirely but never had full clearing of nasal congestion so favor this as double sickening. Regardless, antibiotic options for her are few and would not want to reuse doxycyline. States azithromycin never works for her.   Patient was concerned about bronchitis but symptoms have largely improved with no shortness of breath, afebrile, lungs now clear when previously with rhonchi.   Treatment: -considered steroid: patient opted out with diabetes -other symptomatic care with mucinex -Antibiotic indicated: yes potentially. We ultimately decided to only treat with augmentin if febrile, worsening symptoms, or  persist for another week. Printed rx given.   Finally, we reviewed reasons to return to care including if symptoms worsen or persist or new concerns arise. She would need another office visit if symptoms either recur or continue  Meds ordered this encounter  Medications  . amoxicillin-clavulanate (AUGMENTIN) 875-125 MG tablet    Sig: Take 1 tablet by mouth 2 (two) times daily.    Dispense:  14 tablet    Refill:  0   >50% of 15 minute office visit was spent on counseling (discussing antibiotic use in her and avoiding resistance, another prolonged discussion that bronchitis likely viral though i was thankful she improved on antibiotics) and coordination of care

## 2015-06-12 ENCOUNTER — Other Ambulatory Visit: Payer: Self-pay | Admitting: *Deleted

## 2015-06-12 MED ORDER — METRONIDAZOLE 0.75 % VA GEL: 1.0000 | Freq: Every day | VAGINAL | Status: DC

## 2015-06-12 MED ORDER — FLUCONAZOLE 150 MG PO TABS: 150.0000 mg | ORAL_TABLET | Freq: Once | ORAL | Status: DC

## 2015-06-13 ENCOUNTER — Telehealth: Payer: Self-pay | Admitting: Emergency Medicine

## 2015-06-13 NOTE — Telephone Encounter (Signed)
Telephone call for triage created to discuss message with patient and disposition as appropriate.   

## 2015-06-13 NOTE — Telephone Encounter (Signed)
I do not know any of the policies of practices.  I'm sorry.  So I do not know what to suggest except to call and find out individually.

## 2015-06-13 NOTE — Telephone Encounter (Signed)
Chief Complaint  Patient presents with  . Advice Only    Patient sent mychart message     ===View-only below this line===   ----- Message -----    From: [name]    Sent: 06/10/2015 11:10 PM EST      To: Lyman Speller, MD Subject: Non-Urgent Medical Question  Amber and I were disappointed to learn that your practice does not accept Medicare supplement plans.  I would appreciate any suggested name of a  gynecologist that accepts this insurance whom you would deem acceptable to handle the problems she has which I described to you. Thank you.  By the way ,my yeast infection has abated.

## 2015-06-13 NOTE — Telephone Encounter (Signed)
Routing to Dr. Sabra Heck to review.  Do you have suggestions for local gyn I can offer? Will advise patient to check with plan to ensure this provider accepts her insurance plan.

## 2015-06-15 NOTE — Progress Notes (Signed)
Subjective:     Patient ID: [name], female   DOB: [birth year], 75 y.o.   MRN: [id number]  HPI 75 y.o. Married Caucasian female [id number] here for Estring removal and replacement.  Pt cannot do this on her own.  Pt also reports she's had vulvar itching and irritation for several weeks.  She reports there seems to be a place on the left vulva that is particularly itching.  Not sure is she's having discharge.  It's a little difficult for her to reach her vulva due to flexibility.  Denies vaginal odor.  Denies dysuria.  Denies pelvic pain  Review of Systems  All other systems reviewed and are negative.      Objective:   Physical Exam  Constitutional: She is oriented to person, place, and time. She appears well-developed and well-nourished.  Genitourinary: Uterus normal.    There is no rash, tenderness, lesion or injury on the right labia. There is rash on the left labia. There is no tenderness, lesion or injury on the left labia. Cervix exhibits no motion tenderness, no discharge and no friability. Right adnexum displays no mass, no tenderness and no fullness. Left adnexum displays no mass, no tenderness and no fullness. There is erythema and tenderness in the vagina. No bleeding in the vagina. No foreign body around the vagina. No signs of injury around the vagina. Vaginal discharge (white, copious, without odor) found.  Estring removed and replaced without difficulty.  Lymphadenopathy:       Right: No inguinal adenopathy present.       Left: No inguinal adenopathy present.  Neurological: She is alert and oriented to person, place, and time.  Skin: Skin is warm and dry.  Psychiatric: She has a normal mood and affect.   Wet smear and affirm obtained due to amount of discharge present.  Wet smear:  Ph 5.0, saline: neg clue cells, neg trich, atrophic epithelial cells, ++WBCs.  KOH: neg whiff, +yeast hyphae and buds    Assessment:     Vaginal atrophy, using estring Yeast  vaginitis/vulvitis Skin changes most likely from excoriation     Plan:     Affirm pending Terazol 7 qhs x 7 days Pt knows to call if symptoms do not resolve Estring was removed and replaced

## 2015-06-16 NOTE — Telephone Encounter (Signed)
Called and spoke with [name]. She is advised of our policy regarding new Medicare patients.   Gave patient number of [facility] referrals at [phone] to help patient try to find new provider for her family member.  Routing to provider for final review. Patient agreeable to disposition. Will close encounter.

## 2015-06-19 ENCOUNTER — Other Ambulatory Visit: Payer: Self-pay | Admitting: Family Medicine

## 2015-06-19 NOTE — Telephone Encounter (Signed)
Yes thanks may call in

## 2015-06-20 ENCOUNTER — Other Ambulatory Visit: Payer: Self-pay | Admitting: Family Medicine

## 2015-06-20 NOTE — Telephone Encounter (Signed)
Pt said she contacted the pharmacy and they said they don't have it guaiFENesin-codeine 100-10 MG/5ML syrup    [place]

## 2015-06-20 NOTE — Telephone Encounter (Signed)
I called the pharmacy and spoke with Maudie Mercury and she stated the Rx is ready for pick up.  I called the pt and informed her of this as well.

## 2015-07-03 DIAGNOSIS — Z96651 Presence of right artificial knee joint: Secondary | ICD-10-CM | POA: Diagnosis not present

## 2015-07-03 DIAGNOSIS — Z471 Aftercare following joint replacement surgery: Secondary | ICD-10-CM | POA: Diagnosis not present

## 2015-07-03 DIAGNOSIS — M1712 Unilateral primary osteoarthritis, left knee: Secondary | ICD-10-CM | POA: Diagnosis not present

## 2015-07-07 DIAGNOSIS — D518 Other vitamin B12 deficiency anemias: Secondary | ICD-10-CM | POA: Diagnosis not present

## 2015-07-24 DIAGNOSIS — H25813 Combined forms of age-related cataract, bilateral: Secondary | ICD-10-CM | POA: Diagnosis not present

## 2015-07-24 DIAGNOSIS — H524 Presbyopia: Secondary | ICD-10-CM | POA: Diagnosis not present

## 2015-07-24 DIAGNOSIS — H401133 Primary open-angle glaucoma, bilateral, severe stage: Secondary | ICD-10-CM | POA: Diagnosis not present

## 2015-07-24 DIAGNOSIS — E119 Type 2 diabetes mellitus without complications: Secondary | ICD-10-CM | POA: Diagnosis not present

## 2015-07-28 DIAGNOSIS — I739 Peripheral vascular disease, unspecified: Secondary | ICD-10-CM | POA: Diagnosis not present

## 2015-07-28 DIAGNOSIS — L603 Nail dystrophy: Secondary | ICD-10-CM | POA: Diagnosis not present

## 2015-07-28 DIAGNOSIS — E1151 Type 2 diabetes mellitus with diabetic peripheral angiopathy without gangrene: Secondary | ICD-10-CM | POA: Diagnosis not present

## 2015-07-28 DIAGNOSIS — L84 Corns and callosities: Secondary | ICD-10-CM | POA: Diagnosis not present

## 2015-08-04 DIAGNOSIS — D518 Other vitamin B12 deficiency anemias: Secondary | ICD-10-CM | POA: Diagnosis not present

## 2015-08-13 ENCOUNTER — Ambulatory Visit (INDEPENDENT_AMBULATORY_CARE_PROVIDER_SITE_OTHER)
Admission: RE | Admit: 2015-08-13 | Discharge: 2015-08-13 | Disposition: A | Discharge status: Home / Self Care | Payer: Medicare Other | Source: Ambulatory Visit | Attending: Family Medicine | Admitting: Family Medicine

## 2015-08-13 ENCOUNTER — Ambulatory Visit (INDEPENDENT_AMBULATORY_CARE_PROVIDER_SITE_OTHER): Payer: Medicare Other | Admitting: Family Medicine

## 2015-08-13 ENCOUNTER — Encounter: Payer: Self-pay | Admitting: Family Medicine

## 2015-08-13 VITALS — BP 140/64 | HR 99 | Temp 99.0°F | Wt 241.0 lb

## 2015-08-13 DIAGNOSIS — R0602 Shortness of breath: Secondary | ICD-10-CM

## 2015-08-13 DIAGNOSIS — R509 Fever, unspecified: Secondary | ICD-10-CM

## 2015-08-13 DIAGNOSIS — R059 Cough, unspecified: Secondary | ICD-10-CM

## 2015-08-13 DIAGNOSIS — R05 Cough: Secondary | ICD-10-CM

## 2015-08-13 MED ORDER — AMOXICILLIN-POT CLAVULANATE 875-125 MG PO TABS: 1.0000 | ORAL_TABLET | Freq: Two times a day (BID) | ORAL | Status: DC

## 2015-08-13 NOTE — Patient Instructions (Addendum)
Potential sinusitis- treat with augmentin for 7 days  Potential pneumonia- get x-ray  Potential flu - if x-rays are negative for pneumonia I may go ahead and treat you with tamiflu given your fevers though generally doesn't help if not given in first 48 hours of illness  If you get more short of breath- go to hospital  See me Friday morning in one of the same day slots for a recheck (11:15 or 11:30). Sign up at check out before you leave

## 2015-08-13 NOTE — Progress Notes (Signed)
[name], MD  Subjective:  [name] is a 75 y.o. year old very pleasant female patient who presents for/with See problem oriented charting ROS- reports fever and chills. dyspnea on exertion noted. No chest pain. Some sinus pressure and congestion.   Past Medical History-  Patient Active Problem List   Diagnosis Date Noted  . Diabetic ulcer of left foot associated with type 2 diabetes mellitus ([place]) 03/13/2014    Priority: High  . Diabetes mellitus type II, controlled ([place]) 12/28/2006    Priority: High  . IBS (irritable bowel syndrome) 07/17/2014    Priority: Medium  . Depression 01/03/2014    Priority: Medium  . Essential hypertension, benign 01/03/2014    Priority: Medium  . Fatty liver 03/18/2009    Priority: Medium  . Hyperlipidemia 07/26/2007    Priority: Medium  . ANEMIA, B12 DEFICIENCY 12/29/2006    Priority: Medium  . RESTLESS LEG SYNDROME, MILD 12/28/2006    Priority: Medium  . OSTEOPOROSIS 12/12/2006    Priority: Medium  . CKD (chronic kidney disease), stage II 01/22/2014    Priority: Low  . Posterior tibial tendon dysfunction 01/03/2014    Priority: Low  . Osteoarthritis, knee 01/03/2014    Priority: Low  . Glaucoma 01/03/2014    Priority: Low  . Obesity (BMI 30-39.9) 06/15/2013    Priority: Low  . Foot tendinitis 07/20/2010    Priority: Low  . INSOMNIA, CHRONIC 07/25/2009    Priority: Low  . GERD 12/25/2007    Priority: Low  . NEUROPATHY, IDIOPATHIC PERIPHERAL NEC 12/28/2006    Priority: Low  . BREAST CANCER, HX OF 12/12/2006    Priority: Low  . OA (osteoarthritis) of knee 12/30/2014    Medications- reviewed and updated Current Outpatient Prescriptions  Medication Sig Dispense Refill  . atenolol (TENORMIN) 25 MG tablet TAKE ONE TABLET BY MOUTH ONCE DAILY 90 tablet 3  . betamethasone dipropionate (DIPROLENE) 0.05 % cream Apply 1 application topically 2 (two) times daily as needed.    . cyanocobalamin (,VITAMIN B-12,) 1000 MCG/ML  injection Inject 1 mL (1,000 mcg total) into the muscle every 30 (thirty) days. 1 mL 11  . dorzolamide-timolol (COSOPT) 22.3-6.8 MG/ML ophthalmic solution Place 1 drop into both eyes 2 (two) times daily.     [name] ESTRING 2 MG vaginal ring INSERT 1 RING VAGINALLY EVERY 4 MONTHS 1 each 3  . FLUoxetine (PROZAC) 40 MG capsule TAKE ONE CAPSULE BY MOUTH DAILY 90 capsule 1  . glimepiride (AMARYL) 4 MG tablet TAKE ONE TABLET BY MOUTH DAILY WITH BREAKFAST. 90 tablet 3  . lisinopril (PRINIVIL,ZESTRIL) 10 MG tablet TAKE ONE TABLET BY MOUTH DAILY 90 tablet 3  . LUMIGAN 0.01 % SOLN Place 1 drop into both eyes at bedtime.     . meloxicam (MOBIC) 15 MG tablet TAKE ONE TABLET BY MOUTH ONCE DAILY 90 tablet 2  . Methylcellulose, Laxative, (CITRUCEL) 500 MG TABS Take 2 tablets by mouth daily after supper.     . metroNIDAZOLE (METROGEL) 0.75 % vaginal gel Place 1 Applicatorful vaginally at bedtime. For 5 nights only 70 g 0  . pramipexole (MIRAPEX) 0.5 MG tablet TAKE ONE TABLET BY MOUTH AT BEDTIME 90 tablet 3  . terconazole (TERAZOL 7) 0.4 % vaginal cream Place 1 applicator vaginally at bedtime. 45 g 0  . VICTOZA 18 MG/3ML SOPN INJECT 1.8 MG INTO THE SKIN DAILY 9 pen 6  . albuterol (PROVENTIL HFA;VENTOLIN HFA) 108 (90 Base) MCG/ACT inhaler Inhale 2 puffs into the lungs every 6 (  six) hours as needed for wheezing or shortness of breath. (Patient not taking: Reported on 06/11/2015) 1 Inhaler 0  . amoxicillin-clavulanate (AUGMENTIN) 875-125 MG tablet Take 1 tablet by mouth 2 (two) times daily. 14 tablet 0  . guaiFENesin-codeine 100-10 MG/5ML syrup TAKE 5 MLS BY MOUTH EVERY 6 HOURS AS NEEDED FOR COUGH (Patient not taking: Reported on 08/13/2015) 180 mL 0  . temazepam (RESTORIL) 30 MG capsule TAKE ONE CAPSULE BY MOUTH AT BEDTIME AS NEEDED FOR SLEEP (Patient not taking: Reported on 05/26/2015) 30 capsule 5   No current facility-administered medications for this visit.    Objective: BP 140/64 mmHg  Pulse 99  Temp(Src) 99 F  (37.2 C)  Wt 241 lb (109.317 kg)  SpO2 92%  LMP 05/10/1992 Gen: NAD, resting comfortably when seated, appears fatigued climbing onto table TM perforation chronic on right, TM normal on left. Oropharynx normal, no obvious sinus tenderness CV: RRR no murmurs rubs or gallops Lungs: Diffuse rhonchi and wheeze bilaterally. no crackles.  Abdomen: soft/nontender/nondistended/normal bowel sounds. No rebound or guarding.  Skin: warm, dry, no rash Neuro: grossly normal, moves all extremities  Assessment/Plan:  Cough - Fever, unspecified fever cause  (new acute illness) Shortness of breath - Plan: DG Chest 2 View S: Started symptoms a week ago on Tuesday. Started out with some sinus pressure and then felt like it went down into chest. 101.6 for 3 nights in a row recently. Chills. Very fatigued. Feels short of breath with any activity but ok at rest.. Had sinusitis in February and augmentin cleared symptoms completely. Bronchitis in January- eventually we treated with doxycycline and that cleared symptoms completely. Her symptoms seem to be worsening.  A/P: I suspected pneumonia with fever and shortness of breath but no clear crackles on exam. Will get CXR. With continued congestion, sinus symptoms will cover with augmentin for potential bacterial sinusitis while awaiting x-ray. If positive, consider adding azithromycin. If negative, consider adding tamiflu (we are out of flu swabs and outside of window 48 hours but given her age and comorbidities, fever, and worsening symptoms would treat anyway). Could continue augmentin in 2nd case.   Friday in office follow up planned. Return precautions advised.   Orders Placed This Encounter  Procedures  . DG Chest 2 View    Standing Status: Future     Number of Occurrences:      Standing Expiration Date: 10/12/2016    Order Specific Question:  Reason for Exam (SYMPTOM  OR DIAGNOSIS REQUIRED)    Answer:  shortness of breath, fever- rule out pneumonia.    Order  Specific Question:  Preferred imaging location?    Answer:  [facility]    Meds ordered this encounter  Medications  . amoxicillin-clavulanate (AUGMENTIN) 875-125 MG tablet    Sig: Take 1 tablet by mouth 2 (two) times daily.    Dispense:  14 tablet    Refill:  0

## 2015-08-15 ENCOUNTER — Encounter: Payer: Self-pay | Admitting: Family Medicine

## 2015-08-15 ENCOUNTER — Ambulatory Visit (INDEPENDENT_AMBULATORY_CARE_PROVIDER_SITE_OTHER): Payer: Medicare Other | Admitting: Family Medicine

## 2015-08-15 VITALS — BP 142/66 | HR 90 | Temp 98.7°F | Wt 241.0 lb

## 2015-08-15 DIAGNOSIS — J209 Acute bronchitis, unspecified: Secondary | ICD-10-CM

## 2015-08-15 NOTE — Progress Notes (Signed)
[name], MD  Subjective:  [name] is a 75 y.o. year old very pleasant female patient who presents for/with See problem oriented charting ROS- fever curve has bent - no fever for first time last night at 100.3 max. Still waith fatigue. dyspnea on exertion has resolved. No chest pain. sinus pressure and congestion improved  Past Medical History-  Patient Active Problem List   Diagnosis Date Noted  . Diabetic ulcer of left foot associated with type 2 diabetes mellitus ([place]) 03/13/2014    Priority: High  . Diabetes mellitus type II, controlled ([place]) 12/28/2006    Priority: High  . IBS (irritable bowel syndrome) 07/17/2014    Priority: Medium  . Depression 01/03/2014    Priority: Medium  . Essential hypertension, benign 01/03/2014    Priority: Medium  . Fatty liver 03/18/2009    Priority: Medium  . Hyperlipidemia 07/26/2007    Priority: Medium  . ANEMIA, B12 DEFICIENCY 12/29/2006    Priority: Medium  . RESTLESS LEG SYNDROME, MILD 12/28/2006    Priority: Medium  . OSTEOPOROSIS 12/12/2006    Priority: Medium  . CKD (chronic kidney disease), stage II 01/22/2014    Priority: Low  . Posterior tibial tendon dysfunction 01/03/2014    Priority: Low  . Osteoarthritis, knee 01/03/2014    Priority: Low  . Glaucoma 01/03/2014    Priority: Low  . Obesity (BMI 30-39.9) 06/15/2013    Priority: Low  . Foot tendinitis 07/20/2010    Priority: Low  . INSOMNIA, CHRONIC 07/25/2009    Priority: Low  . GERD 12/25/2007    Priority: Low  . NEUROPATHY, IDIOPATHIC PERIPHERAL NEC 12/28/2006    Priority: Low  . BREAST CANCER, HX OF 12/12/2006    Priority: Low  . OA (osteoarthritis) of knee 12/30/2014    Medications- reviewed and updated Current Outpatient Prescriptions  Medication Sig Dispense Refill  . amoxicillin-clavulanate (AUGMENTIN) 875-125 MG tablet Take 1 tablet by mouth 2 (two) times daily. 14 tablet 0  . atenolol (TENORMIN) 25 MG tablet TAKE ONE TABLET BY MOUTH ONCE  DAILY 90 tablet 3  . betamethasone dipropionate (DIPROLENE) 0.05 % cream Apply 1 application topically 2 (two) times daily as needed.    . cyanocobalamin (,VITAMIN B-12,) 1000 MCG/ML injection Inject 1 mL (1,000 mcg total) into the muscle every 30 (thirty) days. 1 mL 11  . dorzolamide-timolol (COSOPT) 22.3-6.8 MG/ML ophthalmic solution Place 1 drop into both eyes 2 (two) times daily.     [name] ESTRING 2 MG vaginal ring INSERT 1 RING VAGINALLY EVERY 4 MONTHS 1 each 3  . FLUoxetine (PROZAC) 40 MG capsule TAKE ONE CAPSULE BY MOUTH DAILY 90 capsule 1  . glimepiride (AMARYL) 4 MG tablet TAKE ONE TABLET BY MOUTH DAILY WITH BREAKFAST. 90 tablet 3  . lisinopril (PRINIVIL,ZESTRIL) 10 MG tablet TAKE ONE TABLET BY MOUTH DAILY 90 tablet 3  . LUMIGAN 0.01 % SOLN Place 1 drop into both eyes at bedtime.     . meloxicam (MOBIC) 15 MG tablet TAKE ONE TABLET BY MOUTH ONCE DAILY 90 tablet 2  . Methylcellulose, Laxative, (CITRUCEL) 500 MG TABS Take 2 tablets by mouth daily after supper.     . metroNIDAZOLE (METROGEL) 0.75 % vaginal gel Place 1 Applicatorful vaginally at bedtime. For 5 nights only 70 g 0  . pramipexole (MIRAPEX) 0.5 MG tablet TAKE ONE TABLET BY MOUTH AT BEDTIME 90 tablet 3  . terconazole (TERAZOL 7) 0.4 % vaginal cream Place 1 applicator vaginally at bedtime. 45 g 0  .  VICTOZA 18 MG/3ML SOPN INJECT 1.8 MG INTO THE SKIN DAILY 9 pen 6  . albuterol (PROVENTIL HFA;VENTOLIN HFA) 108 (90 Base) MCG/ACT inhaler Inhale 2 puffs into the lungs every 6 (six) hours as needed for wheezing or shortness of breath. (Patient not taking: Reported on 06/11/2015) 1 Inhaler 0  . guaiFENesin-codeine 100-10 MG/5ML syrup TAKE 5 MLS BY MOUTH EVERY 6 HOURS AS NEEDED FOR COUGH (Patient not taking: Reported on 08/13/2015) 180 mL 0  . temazepam (RESTORIL) 30 MG capsule TAKE ONE CAPSULE BY MOUTH AT BEDTIME AS NEEDED FOR SLEEP (Patient not taking: Reported on 05/26/2015) 30 capsule 5   No current facility-administered medications for this  visit.    Objective: BP 142/66 mmHg  Pulse 90  Temp(Src) 98.7 F (37.1 C)  Wt 241 lb (109.317 kg)  SpO2 93%  LMP 05/10/1992 Gen: NAD, resting comfortably when seated and with movement (improved) TM perforation chronic on right, TM normal on left. Oropharynx normal, no sinus tenderness CV: RRR no murmurs rubs or gallops Lungs: rhonchi in bilateral bases- no crackles.   Abdomen: soft/nontender/nondistended/normal bowel sounds. No rebound or guarding.  Skin: warm, dry, no rash Neuro: grossly normal, moves all extremities  Assessment/Plan:  Bronchitis follow up  S: Seen 2 days ago with concern for pneumonia given fever and SOB but x-ray negative for pneumonia. Had rhonchi so likely bronchitis. She was treated with augmentin as also had worsening sinus congestion to cover for sinusitis (also patient generally only responds to antibiotics for bronchitis). She remains fatigued but improving. Shortness of breath have improved and she had her first night without fever last night A/P: Improving on augmentin- treat for full 7 days. We discussed referral to pulmonology given 3 episodes of bronchitis this year (but review really shows 2 episodes with 3rd- being sinusiitis after prior bronchitis treatment). She has been on inhalers in past under Dr. Arnoldo Morale. If were to have bronchitis every 1-2 months on continued basis would refer to pulmonology  Prn follow up. Discussed inf continued issues- likely viral condition instead of bacterial.  Return precautions advised.   The duration of face-to-face time during this visit was 20 minutes. Greater than 50% of this time was spent in counseling, explanation of diagnosis, planning of further management, and/or coordination of care.

## 2015-08-15 NOTE — Patient Instructions (Signed)
Glad you are doing better.   I think a lot of your illness is coming from caring for grandson.   I would still consider pulmonology if you are having multiple continued cases through the summer and fall

## 2015-08-20 ENCOUNTER — Ambulatory Visit: Payer: Medicare Other | Admitting: Family Medicine

## 2015-08-25 DIAGNOSIS — H2511 Age-related nuclear cataract, right eye: Secondary | ICD-10-CM | POA: Diagnosis not present

## 2015-08-27 ENCOUNTER — Telehealth: Payer: Self-pay | Admitting: Obstetrics & Gynecology

## 2015-08-27 ENCOUNTER — Telehealth: Payer: Self-pay | Admitting: Family Medicine

## 2015-08-27 ENCOUNTER — Other Ambulatory Visit: Payer: Self-pay

## 2015-08-27 MED ORDER — FLUCONAZOLE 150 MG PO TABS: 150.0000 mg | ORAL_TABLET | Freq: Once | ORAL | Status: DC

## 2015-08-27 NOTE — Telephone Encounter (Signed)
Spoke with patient. Patient states that she was recently treated for Bronchitis with antibiotics by her PCP. OV note is available in EPIC dated 08/15/2015. Patient reports she has developed a yeast infection from taking her antibiotics. "It is like every time I take an antibiotic I get a yeast infection if I do not get ahead of it with eating yogurt." Reports vaginal itching and white vaginal discharge. Patient is requesting an rx for Diflucan for yeast infection. Reports this has occurred in the past and Dr.Miller has prescribed medication for her. Advised we typically do not treat over the phone and require an office visit for evaluation. She recommends I speak with the provider and return call.  Routing to Dr.Silva for review as Dr.Miller is out of the office today.

## 2015-08-27 NOTE — Telephone Encounter (Signed)
May order diflucan 150mg  PO to be taken once #1. The metronidazole is for bacterial vaginosis- would want to confirm that on wet prep before treatment. If she has continued issues after diflucan have her come in

## 2015-08-27 NOTE — Telephone Encounter (Signed)
Please make an appointment for a vaginitis check.  Patient has been treated for both yeast infection and bacterial vaginosis in the past.  It is hard to know by phone which it is that is occurring currently.  Thank you!

## 2015-08-27 NOTE — Telephone Encounter (Signed)
Pt was on abx and would like diflucan and metronidazole vaginal gel .75 % for yeast infection. [facility] on [address].

## 2015-08-27 NOTE — Telephone Encounter (Signed)
Called and lm on pt vm. 

## 2015-08-27 NOTE — Telephone Encounter (Signed)
Spoke with patient. Advised of message as seen below from [place]. Patient would like to contact her PCP prior to scheduling an appointment with our office as he prescribed her the antibiotic. She will return call to the office to schedule an appointment with Dr.Miller if needed.  Cc: Dr.Miller  Routing to provider for final review. Patient agreeable to disposition. Will close encounter.

## 2015-08-27 NOTE — Telephone Encounter (Signed)
Patient is asking to talk with Dr.Miller's nurse about her recurring yeast infections. Patient declined an appointment today.

## 2015-08-27 NOTE — Telephone Encounter (Signed)
See below

## 2015-08-28 ENCOUNTER — Encounter: Payer: Self-pay | Admitting: Obstetrics & Gynecology

## 2015-08-28 ENCOUNTER — Ambulatory Visit (INDEPENDENT_AMBULATORY_CARE_PROVIDER_SITE_OTHER): Payer: Medicare Other | Admitting: Obstetrics & Gynecology

## 2015-08-28 VITALS — BP 130/62 | HR 88 | Resp 16 | Wt 249.0 lb

## 2015-08-28 DIAGNOSIS — B373 Candidiasis of vulva and vagina: Secondary | ICD-10-CM | POA: Diagnosis not present

## 2015-08-28 DIAGNOSIS — B3731 Acute candidiasis of vulva and vagina: Secondary | ICD-10-CM

## 2015-08-28 MED ORDER — FLUCONAZOLE 150 MG PO TABS: ORAL_TABLET | ORAL | Status: DC

## 2015-08-28 MED ORDER — TERCONAZOLE 0.4 % VA CREA: 1.0000 | TOPICAL_CREAM | Freq: Every day | VAGINAL | Status: DC

## 2015-08-28 NOTE — Progress Notes (Signed)
GYNECOLOGY  VISIT   HPI: 75 y.o. G0P0 Married Caucasian female with 4 day hx of vulvar and vaginal itching.  She thinks there is a little odor as well that is associated.  Pt is unsure of the presence of vaginal discharge.  She feels this is related to being on Augmentin for a bronchitis for seven days earlier in the month.  Pt called her PCP who called in a Diflucan.  Reports symptoms are some improved but not gone. She took this yesterday.    Denies fever, vaginal bleeding, back or pelvic pain, or urinary changes.  GYNECOLOGIC HISTORY: Patient's last menstrual period was 05/10/1992.  Past Medical History  Diagnosis Date  . 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 Neuropathy ([place])   . Diverticulitis   . Headache(784.0)     Migraines  . Hammer toe   . PONV (postoperative nausea and vomiting)   . Hypertension     takes Atenolol daily  . Joint pain   . Arthritis     right knee;injections every 39months   . Scoliosis   . IBS (irritable bowel syndrome)   . Vitamin D deficiency     takes Vit d daily  . Diabetes mellitus     takes Amaryl and Januvia daily  . Early cataracts, bilateral   . Glaucoma   . Endometriosis   . Gastritis   . Asthma   . COMPRESSION FRACTURE, LUMBAR VERTEBRAE 08/21/2008    Qualifier: Diagnosis of  By: Arnoldo Morale MD, Balinda Quails   . Kidney stone   . Cancer (HCC)     HX BREAST CANCER  . Osteopenia   . Posterior tibial tendon dysfunction     left foot  . Eczema   . Osteomyelitis ([place])     left 2nd toe  . Difficulty sleeping   . Depression   . Open wound of second toe of left foot     Past Surgical History  Procedure Laterality Date  . Cholecystectomy  06/2008  . Tonsillectomy    . Thyroidectomy      80yrs ago. follows endocrine  . Mastectomy Bilateral 1994 right, 1995 left  . Laparoscopic oophorectomy Right 12/2004    absent LSO  . Laparotomy    . Diagnostic laparoscopy    . Adenoidectomy      at age 61  . Excision  removed from neck      infected lymph node  . Colonoscopy  11/02/05  . Shoulder hemi-arthroplasty  01/01/2012    Procedure: SHOULDER HEMI-ARTHROPLASTY;  Surgeon: Roseanne Kaufman, MD;  Location: [facility];  Service: Orthopedics;  Laterality: Left;  Left Shoulder Hemi Arthroplasty with Repair and Reconstruction as Necessary   . Endometrial biopsy  06/22/2011    benign polyp, no hyperplasia  . Hysteroscopy  06/22/11    PMB submucosal myoma  . Biopsy on back      benign  . Excision on breast      internal infected suture from breast surgery  . Foot surgery      left foot-cleaning out areas on toes at the wound center-having MRI to determine if osteomyelitis  . Joint replacement      left total shoulder  . Dilation and curettage of uterus    . Total knee arthroplasty Right 12/30/2014    Procedure: RIGHT TOTAL KNEE ARTHROPLASTY;  Surgeon: Gaynelle Arabian, MD;  Location: [facility];  Service: Orthopedics;  Laterality: Right;    MEDS:  Reviewed  in EPIC and UTD  ALLERGIES: Cephalexin; Erythromycin ethylsuccinate; Levaquin; Nitrofurantoin; and Percocet  Family History  Problem Relation Age of Onset  . Arthritis Mother   . COPD Father   . Heart disease Father     MI 81  . Hypertension Father   . Hyperlipidemia Father   . Colon cancer Neg Hx   . Esophageal cancer Neg Hx   . Rectal cancer Neg Hx   . Stomach cancer Neg Hx     SH:  Married, non smoker  Review of Systems  Genitourinary: Negative for dysuria, urgency, frequency and hematuria.  All other systems reviewed and are negative.  PHYSICAL EXAMINATION:    BP 130/62 mmHg  Pulse 88  Resp 16  Wt 249 lb (112.946 kg)  LMP 05/10/1992     Physical Exam  Constitutional: She is oriented to person, place, and time. She appears well-developed and well-nourished.  Abdominal: Soft. Bowel sounds are normal.  Genitourinary: Uterus normal.    No labial fusion. There is rash on the right labia. There is no tenderness, lesion or injury on the right  labia. There is rash on the left labia. There is no tenderness, lesion or injury on the left labia. Cervix exhibits no motion tenderness. Right adnexum displays no mass and no tenderness. Left adnexum displays no mass and no tenderness. Vaginal discharge (whitish around Estring) found.  Lymphadenopathy:       Right: No inguinal adenopathy present.       Left: No inguinal adenopathy present.  Neurological: She is alert and oriented to person, place, and time.  Skin: Skin is warm and dry.  Psychiatric: She has a normal mood and affect.   Wet smear:  PH:  5.0.  Saline: epithelial cells noted, no WBCs, no clue cells, no trich.  KOH:  + yeast.  Neg Whiff.  Chaperone was present for exam.  Assessment: Yeast vulvitis Diabetes PMP, no HRT, but uses estring for vaginal atrophic changes Incontinence H/O breast cnacer s/p bilateral mastectomy 1995 H/O RSO 8/06 by Dr. Alycia Rossetti H/O endometriosis and uterine fibroids  Plan: Diflucan 150mg  po x 1, repeat 72 hrs.  Can repeat for up to two dosages. Terazol 7 qhs nightly for 7 nights.  Can use externally two to three times daily.  Pt aware to call if symptoms do not fully resolve.

## 2015-09-02 DIAGNOSIS — D518 Other vitamin B12 deficiency anemias: Secondary | ICD-10-CM | POA: Diagnosis not present

## 2015-09-05 ENCOUNTER — Other Ambulatory Visit: Payer: Self-pay | Admitting: Family Medicine

## 2015-09-08 DIAGNOSIS — H2512 Age-related nuclear cataract, left eye: Secondary | ICD-10-CM | POA: Diagnosis not present

## 2015-09-08 DIAGNOSIS — H25811 Combined forms of age-related cataract, right eye: Secondary | ICD-10-CM | POA: Diagnosis not present

## 2015-09-08 DIAGNOSIS — H2511 Age-related nuclear cataract, right eye: Secondary | ICD-10-CM | POA: Diagnosis not present

## 2015-09-10 DIAGNOSIS — M7122 Synovial cyst of popliteal space [Baker], left knee: Secondary | ICD-10-CM | POA: Diagnosis not present

## 2015-09-10 DIAGNOSIS — M1712 Unilateral primary osteoarthritis, left knee: Secondary | ICD-10-CM | POA: Diagnosis not present

## 2015-09-12 ENCOUNTER — Encounter: Payer: Self-pay | Admitting: Obstetrics & Gynecology

## 2015-09-12 ENCOUNTER — Encounter: Payer: Self-pay | Admitting: Family Medicine

## 2015-09-14 NOTE — Telephone Encounter (Signed)
Call to patient. Advised that office had been closed due to water issue in building and apologized for delay in response. Have reviewed message with Dr Sabra Heck who recommends office visit for evaluation since previous Diflucan and Terazol have not worked. Patient agreeable but states symptoms have improved significantly since she sent message. Itching is better but not gone so would like appointment. Appointment scheduled for 09-15-15 at 1145. Patient advised if she continues to improve and decides she does not need appointment, she may call back and cancel without cancel charge.   Routing to provider for final review. Patient agreeable to disposition. Will close encounter.

## 2015-09-15 ENCOUNTER — Ambulatory Visit: Payer: Medicare Other | Admitting: Obstetrics & Gynecology

## 2015-09-15 ENCOUNTER — Telehealth: Payer: Self-pay | Admitting: Obstetrics & Gynecology

## 2015-09-15 NOTE — Telephone Encounter (Signed)
Patient cancelled her appointment for a "possible yeast infection" today with Dr. Sabra Heck. She said, "I am feeling better and don't think I need this appointment. I'll call back if I need to."

## 2015-09-16 NOTE — Telephone Encounter (Signed)
Ok to close encounter.  Thanks. 

## 2015-09-22 ENCOUNTER — Other Ambulatory Visit: Payer: Self-pay | Admitting: Family Medicine

## 2015-09-22 ENCOUNTER — Other Ambulatory Visit: Payer: Self-pay | Admitting: Internal Medicine

## 2015-09-22 DIAGNOSIS — I6523 Occlusion and stenosis of bilateral carotid arteries: Secondary | ICD-10-CM

## 2015-09-29 ENCOUNTER — Other Ambulatory Visit: Payer: Self-pay | Admitting: Family Medicine

## 2015-09-30 ENCOUNTER — Telehealth: Payer: Self-pay | Admitting: Pediatrics

## 2015-09-30 NOTE — Telephone Encounter (Signed)
Yes thanks 

## 2015-09-30 NOTE — Telephone Encounter (Signed)
Okay to refill Temazepam?

## 2015-09-30 NOTE — Telephone Encounter (Signed)
Rx for Temazeam called to pharmacy.

## 2015-10-03 DIAGNOSIS — D518 Other vitamin B12 deficiency anemias: Secondary | ICD-10-CM | POA: Diagnosis not present

## 2015-10-04 ENCOUNTER — Ambulatory Visit (INDEPENDENT_AMBULATORY_CARE_PROVIDER_SITE_OTHER): Payer: Medicare Other

## 2015-10-04 ENCOUNTER — Ambulatory Visit (INDEPENDENT_AMBULATORY_CARE_PROVIDER_SITE_OTHER)
Admission: EM | Admit: 2015-10-04 | Discharge: 2015-10-04 | Disposition: A | Discharge status: Nursing Home (Includes ICF) | Payer: Medicare Other | Source: Home / Self Care

## 2015-10-04 ENCOUNTER — Emergency Department (HOSPITAL_COMMUNITY): Payer: Medicare Other

## 2015-10-04 ENCOUNTER — Inpatient Hospital Stay (HOSPITAL_COMMUNITY)
Admission: EM | Admit: 2015-10-04 | Discharge: 2015-10-08 | DRG: 392 | Disposition: A | Discharge status: Home / Self Care | Payer: Medicare Other | Attending: Surgery | Admitting: Surgery

## 2015-10-04 ENCOUNTER — Encounter (HOSPITAL_COMMUNITY): Payer: Self-pay | Admitting: *Deleted

## 2015-10-04 ENCOUNTER — Encounter (HOSPITAL_COMMUNITY): Payer: Self-pay | Admitting: Emergency Medicine

## 2015-10-04 DIAGNOSIS — N182 Chronic kidney disease, stage 2 (mild): Secondary | ICD-10-CM | POA: Diagnosis not present

## 2015-10-04 DIAGNOSIS — Z96612 Presence of left artificial shoulder joint: Secondary | ICD-10-CM | POA: Diagnosis present

## 2015-10-04 DIAGNOSIS — K5720 Diverticulitis of large intestine with perforation and abscess without bleeding: Principal | ICD-10-CM | POA: Diagnosis present

## 2015-10-04 DIAGNOSIS — R0602 Shortness of breath: Secondary | ICD-10-CM

## 2015-10-04 DIAGNOSIS — E1165 Type 2 diabetes mellitus with hyperglycemia: Secondary | ICD-10-CM

## 2015-10-04 DIAGNOSIS — Z888 Allergy status to other drugs, medicaments and biological substances status: Secondary | ICD-10-CM

## 2015-10-04 DIAGNOSIS — R05 Cough: Secondary | ICD-10-CM | POA: Diagnosis not present

## 2015-10-04 DIAGNOSIS — R509 Fever, unspecified: Secondary | ICD-10-CM

## 2015-10-04 DIAGNOSIS — R062 Wheezing: Secondary | ICD-10-CM

## 2015-10-04 DIAGNOSIS — Z8249 Family history of ischemic heart disease and other diseases of the circulatory system: Secondary | ICD-10-CM

## 2015-10-04 DIAGNOSIS — E559 Vitamin D deficiency, unspecified: Secondary | ICD-10-CM | POA: Diagnosis present

## 2015-10-04 DIAGNOSIS — K5792 Diverticulitis of intestine, part unspecified, without perforation or abscess without bleeding: Secondary | ICD-10-CM | POA: Diagnosis present

## 2015-10-04 DIAGNOSIS — E119 Type 2 diabetes mellitus without complications: Secondary | ICD-10-CM

## 2015-10-04 DIAGNOSIS — Z881 Allergy status to other antibiotic agents status: Secondary | ICD-10-CM

## 2015-10-04 DIAGNOSIS — M419 Scoliosis, unspecified: Secondary | ICD-10-CM | POA: Diagnosis present

## 2015-10-04 DIAGNOSIS — Z79899 Other long term (current) drug therapy: Secondary | ICD-10-CM

## 2015-10-04 DIAGNOSIS — I129 Hypertensive chronic kidney disease with stage 1 through stage 4 chronic kidney disease, or unspecified chronic kidney disease: Secondary | ICD-10-CM | POA: Diagnosis not present

## 2015-10-04 DIAGNOSIS — E1122 Type 2 diabetes mellitus with diabetic chronic kidney disease: Secondary | ICD-10-CM | POA: Diagnosis present

## 2015-10-04 DIAGNOSIS — K589 Irritable bowel syndrome without diarrhea: Secondary | ICD-10-CM | POA: Diagnosis present

## 2015-10-04 DIAGNOSIS — Z885 Allergy status to narcotic agent status: Secondary | ICD-10-CM

## 2015-10-04 DIAGNOSIS — J40 Bronchitis, not specified as acute or chronic: Secondary | ICD-10-CM

## 2015-10-04 DIAGNOSIS — Z9013 Acquired absence of bilateral breasts and nipples: Secondary | ICD-10-CM

## 2015-10-04 DIAGNOSIS — F329 Major depressive disorder, single episode, unspecified: Secondary | ICD-10-CM | POA: Diagnosis present

## 2015-10-04 DIAGNOSIS — E1129 Type 2 diabetes mellitus with other diabetic kidney complication: Secondary | ICD-10-CM

## 2015-10-04 DIAGNOSIS — Z853 Personal history of malignant neoplasm of breast: Secondary | ICD-10-CM

## 2015-10-04 DIAGNOSIS — R1030 Lower abdominal pain, unspecified: Secondary | ICD-10-CM | POA: Diagnosis not present

## 2015-10-04 DIAGNOSIS — H409 Unspecified glaucoma: Secondary | ICD-10-CM | POA: Diagnosis present

## 2015-10-04 LAB — CBC WITH DIFFERENTIAL/PLATELET
BASOS PCT: 0 %
Basophils Absolute: 0 10*3/uL (ref 0.0–0.1)
Eosinophils Absolute: 0 10*3/uL (ref 0.0–0.7)
Eosinophils Relative: 0 %
HCT: 40.7 % (ref 36.0–46.0)
Hemoglobin: 13 g/dL (ref 12.0–15.0)
LYMPHS ABS: 1.3 10*3/uL (ref 0.7–4.0)
Lymphocytes Relative: 9 %
MCH: 28.7 pg (ref 26.0–34.0)
MCHC: 31.9 g/dL (ref 30.0–36.0)
MCV: 89.8 fL (ref 78.0–100.0)
MONO ABS: 0.7 10*3/uL (ref 0.1–1.0)
Monocytes Relative: 5 %
NEUTROS PCT: 86 %
Neutro Abs: 12.1 10*3/uL — ABNORMAL HIGH (ref 1.7–7.7)
PLATELETS: 250 10*3/uL (ref 150–400)
RBC: 4.53 MIL/uL (ref 3.87–5.11)
RDW: 14.2 % (ref 11.5–15.5)
WBC: 14.1 10*3/uL — AB (ref 4.0–10.5)

## 2015-10-04 LAB — URINALYSIS, ROUTINE W REFLEX MICROSCOPIC
Bilirubin Urine: NEGATIVE
Glucose, UA: NEGATIVE mg/dL
HGB URINE DIPSTICK: NEGATIVE
Ketones, ur: 15 mg/dL — AB
Leukocytes, UA: NEGATIVE
Nitrite: NEGATIVE
PH: 5.5 (ref 5.0–8.0)
Protein, ur: 100 mg/dL — AB
SPECIFIC GRAVITY, URINE: 1.024 (ref 1.005–1.030)

## 2015-10-04 LAB — URINE MICROSCOPIC-ADD ON: RBC / HPF: NONE SEEN RBC/hpf (ref 0–5)

## 2015-10-04 LAB — COMPREHENSIVE METABOLIC PANEL
ALBUMIN: 3.4 g/dL — AB (ref 3.5–5.0)
ALK PHOS: 68 U/L (ref 38–126)
ALT: 22 U/L (ref 14–54)
AST: 23 U/L (ref 15–41)
Anion gap: 11 (ref 5–15)
BUN: 16 mg/dL (ref 6–20)
CALCIUM: 9 mg/dL (ref 8.9–10.3)
CHLORIDE: 100 mmol/L — AB (ref 101–111)
CO2: 22 mmol/L (ref 22–32)
CREATININE: 0.8 mg/dL (ref 0.44–1.00)
GFR calc non Af Amer: >60 mL/min (ref >60)
GLUCOSE: 204 mg/dL — AB (ref 65–99)
Potassium: 4.3 mmol/L (ref 3.5–5.1)
SODIUM: 133 mmol/L — AB (ref 135–145)
Total Bilirubin: 0.6 mg/dL (ref 0.3–1.2)
Total Protein: 6.8 g/dL (ref 6.5–8.1)

## 2015-10-04 LAB — LIPASE, BLOOD: Lipase: 22 U/L (ref 11–51)

## 2015-10-04 MED ORDER — ALBUTEROL SULFATE (2.5 MG/3ML) 0.083% IN NEBU
INHALATION_SOLUTION | RESPIRATORY_TRACT | Status: AC
  Filled 2015-10-04: qty 3

## 2015-10-04 MED ORDER — IPRATROPIUM BROMIDE 0.02 % IN SOLN
0.5000 mg | Freq: Once | RESPIRATORY_TRACT | Status: AC
  Administered 2015-10-04: 0.5 mg via RESPIRATORY_TRACT

## 2015-10-04 MED ORDER — IPRATROPIUM-ALBUTEROL 0.5-2.5 (3) MG/3ML IN SOLN
RESPIRATORY_TRACT | Status: AC
  Filled 2015-10-04: qty 3

## 2015-10-04 MED ORDER — ALBUTEROL SULFATE (2.5 MG/3ML) 0.083% IN NEBU
5.0000 mg | INHALATION_SOLUTION | Freq: Once | RESPIRATORY_TRACT | Status: AC
  Administered 2015-10-04: 5 mg via RESPIRATORY_TRACT

## 2015-10-04 MED ORDER — SODIUM CHLORIDE 0.9 % IN NEBU
INHALATION_SOLUTION | RESPIRATORY_TRACT | Status: AC
  Filled 2015-10-04: qty 3

## 2015-10-04 MED ORDER — IOPAMIDOL (ISOVUE-300) INJECTION 61%
INTRAVENOUS | Status: AC
  Administered 2015-10-04: 100 mL
  Filled 2015-10-04: qty 100

## 2015-10-04 NOTE — ED Provider Notes (Signed)
CSN: [id number]     Arrival date & time 10/04/15  1853 History   None    Chief Complaint  Patient presents with  . Fever  . Abdominal Pain  . Cough   (Consider location/radiation/quality/duration/timing/severity/associated sxs/prior Treatment)  HPI   Patient is a 75 year old female presenting today with complaints of shortness of breath cough, congestion, fever 3 days. Patient states she has a frequent history of bronchitis with last diagnosis just a few months ago. Patient states that she does not move much and stays in her recliner most days. Reports some lower abdominal pain with an onset of "severe diarrhea" today. States she has been febrile on and off for several days.     Past Medical History  Diagnosis Date  . 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 Neuropathy ([place])   . Diverticulitis   . Headache(784.0)     Migraines  . Hammer toe   . PONV (postoperative nausea and vomiting)   . Hypertension     takes Atenolol daily  . Joint pain   . Arthritis     right knee;injections every 46months   . Scoliosis   . IBS (irritable bowel syndrome)   . Vitamin D deficiency     takes Vit d daily  . Diabetes mellitus     takes Amaryl and Januvia daily  . Early cataracts, bilateral   . Glaucoma   . Endometriosis   . Gastritis   . Asthma   . COMPRESSION FRACTURE, LUMBAR VERTEBRAE 08/21/2008    Qualifier: Diagnosis of  By: Arnoldo Morale MD, Balinda Quails   . Kidney stone   . Osteopenia   . Posterior tibial tendon dysfunction     left foot  . Eczema   . Osteomyelitis ([place])     left 2nd toe  . Difficulty sleeping   . Depression   . Open wound of second toe of left foot   . Cancer [facility])     HX BREAST CANCER   Past Surgical History  Procedure Laterality Date  . Cholecystectomy  06/2008  . Tonsillectomy    . Thyroidectomy      16yrs ago. follows endocrine  . Mastectomy Bilateral 1994 right, 1995 left  . Laparoscopic oophorectomy Right 12/2004   absent LSO  . Laparotomy    . Diagnostic laparoscopy    . Adenoidectomy      at age 28  . Excision removed from neck      infected lymph node  . Colonoscopy  11/02/05  . Shoulder hemi-arthroplasty  01/01/2012    Procedure: SHOULDER HEMI-ARTHROPLASTY;  Surgeon: Roseanne Kaufman, MD;  Location: [facility];  Service: Orthopedics;  Laterality: Left;  Left Shoulder Hemi Arthroplasty with Repair and Reconstruction as Necessary   . Endometrial biopsy  06/22/2011    benign polyp, no hyperplasia  . Hysteroscopy  06/22/11    PMB submucosal myoma  . Biopsy on back      benign  . Excision on breast      internal infected suture from breast surgery  . Foot surgery      left foot-cleaning out areas on toes at the wound center-having MRI to determine if osteomyelitis  . Joint replacement      left total shoulder  . Dilation and curettage of uterus    . Total knee arthroplasty Right 12/30/2014    Procedure: RIGHT TOTAL KNEE ARTHROPLASTY;  Surgeon: Gaynelle Arabian, MD;  Location: [facility];  Service: Orthopedics;  Laterality: Right;  . Cataract extraction    . Breast surgery    . Mastectomy      Bilateral   Family History  Problem Relation Age of Onset  . Arthritis Mother   . COPD Father   . Heart disease Father     MI 40  . Hypertension Father   . Hyperlipidemia Father   . Colon cancer Neg Hx   . Esophageal cancer Neg Hx   . Rectal cancer Neg Hx   . Stomach cancer Neg Hx    Social History  Substance Use Topics  . Smoking status: Never Smoker   . Smokeless tobacco: Never Used  . Alcohol Use: No   OB History    Gravida Para Term Preterm AB TAB SAB Ectopic Multiple Living   0               Obstetric Comments   1 adopted     Review of Systems  Constitutional: Negative.  Negative for fever and fatigue.  HENT: Negative.   Eyes: Negative.  Negative for visual disturbance.  Respiratory: Negative.  Negative for cough and shortness of breath.   Cardiovascular: Negative.  Negative for chest pain and  leg swelling.  Gastrointestinal: Negative.   Endocrine: Negative.   Genitourinary: Negative.   Musculoskeletal: Negative.  Negative for gait problem and neck stiffness.  Skin: Negative.   Allergic/Immunologic: Negative.   Neurological: Negative.  Negative for dizziness and headaches.  Hematological: Negative.   Psychiatric/Behavioral: Negative.     Allergies  Cephalexin; Erythromycin ethylsuccinate; Levaquin; Nitrofurantoin; and Percocet  Home Medications   Prior to Admission medications   Medication Sig Start Date End Date Taking? Authorizing Provider  atenolol (TENORMIN) 25 MG tablet TAKE ONE TABLET BY MOUTH ONCE DAILY 09/24/14  Yes Marin Olp, MD  cyanocobalamin (,VITAMIN B-12,) 1000 MCG/ML injection Inject 1 mL (1,000 mcg total) into the muscle every 30 (thirty) days. 05/21/15 05/05/16 Yes Marin Olp, MD  dorzolamide-timolol (COSOPT) 22.3-6.8 MG/ML ophthalmic solution Place 1 drop into both eyes 2 (two) times daily.  02/08/13  Yes Historical Provider, MD  ESTRING 2 MG vaginal ring INSERT 1 RING VAGINALLY EVERY 4 MONTHS 05/14/15  Yes Megan Salon, MD  FLUoxetine (PROZAC) 40 MG capsule TAKE ONE CAPSULE BY MOUTH DAILY 04/07/15  Yes Marin Olp, MD  FREESTYLE LITE test strip USE TO CHECK BLOOD SUGAR DAILY AND AS NEEDED 09/30/15  Yes Marin Olp, MD  glimepiride (AMARYL) 4 MG tablet TAKE ONE TABLET BY MOUTH DAILY WITH BREAKFAST. 01/20/15  Yes Marin Olp, MD  lisinopril (PRINIVIL,ZESTRIL) 10 MG tablet TAKE ONE TABLET BY MOUTH DAILY 01/20/15  Yes Marin Olp, MD  LUMIGAN 0.01 % SOLN Place 1 drop into both eyes at bedtime.  03/26/14  Yes Historical Provider, MD  meloxicam (MOBIC) 15 MG tablet TAKE ONE TABLET BY MOUTH ONCE DAILY 04/21/15  Yes Marin Olp, MD  Methylcellulose, Laxative, (CITRUCEL) 500 MG TABS Take 2 tablets by mouth daily after supper.    Yes Historical Provider, MD  pramipexole (MIRAPEX) 0.5 MG tablet TAKE ONE TABLET BY MOUTH AT BEDTIME 09/24/14   Yes Marin Olp, MD  temazepam (RESTORIL) 30 MG capsule TAKE ONE CAPSULE BY MOUTH AT BEDTIME AS NEEDED FOR SLEEP 09/30/15  Yes Marin Olp, MD  VICTOZA 18 MG/3ML SOPN INJECT 1.8MG  INTO THE SKIN DAILY. 09/05/15  Yes Marin Olp, MD  albuterol (PROVENTIL HFA;VENTOLIN HFA) 108 (90 Base) MCG/ACT inhaler Inhale 2 puffs into  the lungs every 6 (six) hours as needed for wheezing or shortness of breath. 05/26/15   Marin Olp, MD  betamethasone dipropionate (DIPROLENE) 0.05 % cream Apply 1 application topically 2 (two) times daily as needed. 10/09/14   Historical Provider, MD  fluconazole (DIFLUCAN) 150 MG tablet Take one every three days 08/28/15   Megan Salon, MD  terconazole (TERAZOL 7) 0.4 % vaginal cream Place 1 applicator vaginally at bedtime. 08/28/15   Megan Salon, MD   Meds Ordered and Administered this Visit   Medications  albuterol (PROVENTIL) (2.5 MG/3ML) 0.083% nebulizer solution 5 mg (5 mg Nebulization Given 10/04/15 1958)  ipratropium (ATROVENT) nebulizer solution 0.5 mg (0.5 mg Nebulization Given 10/04/15 1958)    BP 137/81 mmHg  Pulse 113  Temp(Src) 99.5 F (37.5 C) (Oral)  Resp 18  SpO2 94%  LMP 05/10/1992 No data found.   Physical Exam  Constitutional: She is oriented to person, place, and time. She appears well-developed and well-nourished.  Eyes: Conjunctivae are normal. Pupils are equal, round, and reactive to light. Right eye exhibits no discharge. Left eye exhibits no discharge. No scleral icterus.  Neck: Normal range of motion. Neck supple. No thyromegaly present.  Cardiovascular: Normal rate, regular rhythm, normal heart sounds and intact distal pulses.  Exam reveals no gallop and no friction rub.   No murmur heard. Pulmonary/Chest: Effort normal. No respiratory distress. She has wheezes. She has rales. She exhibits no tenderness.  There is increased work of breathing noted. Rales noted bilaterally in bases particularly in right lower lobe.  They do  not clear to coughing.  Abdominal: Soft. She exhibits no distension.  Diminished bowel sounds RU and RL quadrants.   Neurological: She is alert and oriented to person, place, and time.  Skin: Skin is warm. No rash noted. She is diaphoretic.  The patient is diaphoretic.    Nursing note and vitals reviewed.  Patient states she "feels better" following breathing treatment however coarse lung sounds noted throughout. The patient's pulse ox on room air is 92% and does not increase significantly with good full deep breathing.  The patient appears tired.  ED Course  Procedures (including critical care time)  Labs Review Labs Reviewed - No data to display  Imaging Review Dg Chest 2 View  10/04/2015  CLINICAL DATA:  Cough and congestion for 3 days EXAM: CHEST  2 VIEW COMPARISON:  08/13/2015 FINDINGS: Cardiac shadow is within normal limits. The lungs are well aerated bilaterally. No focal infiltrate or sizable effusion is seen. Bilateral surgical changes are noted within the axilla as well as in the left shoulder. No acute bony abnormality seen. IMPRESSION: No acute abnormality noted. Electronically Signed   By: Inez Catalina M.D.   On: 10/04/2015 20:24    MDM   1. Bronchitis   2. Wheezing   3. Shortness of breath   4. Fever, unspecified fever cause    Plan of care discussed with Dr. Bridgett Larsson.  Patient agrees to transfer to [facility] ED for further evaluation of fever and breathing.  She does not have a nebulizer at home and states her inhaler does not help.  The patient verbalizes understanding and agrees to plan of care.       Nehemiah Settle, NP 10/04/15 2035

## 2015-10-04 NOTE — ED Notes (Signed)
Patient transported to CT 

## 2015-10-04 NOTE — ED Notes (Signed)
C/O bronchitis 4x this yr; started 2 days ago with productive cough and wheezing.  Has not used previous inhaler - feels it never helped before for bronchitis.  Started with fever and chills today.  Also started with low abd pain at 1200 today with one episode of liquidy stool.  Pain continues.

## 2015-10-04 NOTE — ED Notes (Signed)
Per pt, she had a temp of 103, with a productive cough, c/o abdominal pain and diarrhea x 1. O2 sat at 89 upon arrival.

## 2015-10-04 NOTE — ED Notes (Signed)
Pt ambulated to the bathroom, maintained a 90-94% O2 on room air. Pt currently sitting on the bed with an O2 sat of 96% room air.

## 2015-10-04 NOTE — ED Notes (Signed)
Report called to [facility], ED Triage RN.

## 2015-10-04 NOTE — ED Provider Notes (Signed)
CSN: [id number]     Arrival date & time 10/04/15  2053 History   First MD Initiated Contact with Patient 10/04/15 2138     Chief Complaint  Patient presents with  . Cough   HPI  Hx of chronic bronchitis.  1-2 days onset of sx.  Fever to 102.7.  Sharp abd pains in b/l lower abd with bloating sensation.  Improved with BM but still has the pain and bloating.  Has hx of diverticulitis.  HAd diarrhea today.  No blood in stool.  No emesis. +nausea.  No chest pain.  +SOB mild with wheezing. Coughing with sputum clear, no blood.   Has chronic bronchitis. Not on O2 at home.  No dysuria or hematuria.  No constipation.  HAs had laparascopy and oophorectomy.     Past Medical History  Diagnosis Date  . 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 Neuropathy ([place])   . Diverticulitis   . Headache(784.0)     Migraines  . Hammer toe   . PONV (postoperative nausea and vomiting)   . Hypertension     takes Atenolol daily  . Joint pain   . Arthritis     right knee;injections every 68months   . Scoliosis   . IBS (irritable bowel syndrome)   . Vitamin D deficiency     takes Vit d daily  . Diabetes mellitus     takes Amaryl and Januvia daily  . Early cataracts, bilateral   . Glaucoma   . Endometriosis   . Gastritis   . Asthma   . COMPRESSION FRACTURE, LUMBAR VERTEBRAE 08/21/2008    Qualifier: Diagnosis of  By: Arnoldo Morale MD, Balinda Quails   . Kidney stone   . Osteopenia   . Posterior tibial tendon dysfunction     left foot  . Eczema   . Osteomyelitis ([place])     left 2nd toe  . Difficulty sleeping   . Depression   . Open wound of second toe of left foot   . Cancer [facility])     HX BREAST CANCER   Past Surgical History  Procedure Laterality Date  . Cholecystectomy  06/2008  . Tonsillectomy    . Thyroidectomy      11yrs ago. follows endocrine  . Mastectomy Bilateral 1994 right, 1995 left  . Laparoscopic oophorectomy Right 12/2004    absent LSO  . Laparotomy    .  Diagnostic laparoscopy    . Adenoidectomy      at age 59  . Excision removed from neck      infected lymph node  . Colonoscopy  11/02/05  . Shoulder hemi-arthroplasty  01/01/2012    Procedure: SHOULDER HEMI-ARTHROPLASTY;  Surgeon: Roseanne Kaufman, MD;  Location: [place];  Service: Orthopedics;  Laterality: Left;  Left Shoulder Hemi Arthroplasty with Repair and Reconstruction as Necessary   . Endometrial biopsy  06/22/2011    benign polyp, no hyperplasia  . Hysteroscopy  06/22/11    PMB submucosal myoma  . Biopsy on back      benign  . Excision on breast      internal infected suture from breast surgery  . Foot surgery      left foot-cleaning out areas on toes at the wound center-having MRI to determine if osteomyelitis  . Joint replacement      left total shoulder  . Dilation and curettage of uterus    . Total knee arthroplasty Right 12/30/2014  Procedure: RIGHT TOTAL KNEE ARTHROPLASTY;  Surgeon: Gaynelle Arabian, MD;  Location: [facility];  Service: Orthopedics;  Laterality: Right;  . Cataract extraction    . Breast surgery    . Mastectomy      Bilateral   Family History  Problem Relation Age of Onset  . Arthritis Mother   . COPD Father   . Heart disease Father     MI 36  . Hypertension Father   . Hyperlipidemia Father   . Colon cancer Neg Hx   . Esophageal cancer Neg Hx   . Rectal cancer Neg Hx   . Stomach cancer Neg Hx    Social History  Substance Use Topics  . Smoking status: Never Smoker   . Smokeless tobacco: Never Used  . Alcohol Use: No   OB History    Gravida Para Term Preterm AB TAB SAB Ectopic Multiple Living   0               Obstetric Comments   1 adopted     Review of Systems  Constitutional: Positive for fever, appetite change and fatigue. Negative for chills.  Respiratory: Positive for cough and shortness of breath.   Cardiovascular: Negative for chest pain.  Gastrointestinal: Positive for nausea, abdominal pain and diarrhea. Negative for vomiting, blood  in stool and abdominal distention.  Genitourinary: Negative for dysuria and flank pain.  Musculoskeletal: Negative for back pain.  Skin: Negative for rash.  Psychiatric/Behavioral: Negative for confusion.  All other systems reviewed and are negative.     Allergies  Cephalexin; Erythromycin ethylsuccinate; Levaquin; Nitrofurantoin; and Percocet  Home Medications   Prior to Admission medications   Medication Sig Start Date End Date Taking? Authorizing Provider  albuterol (PROVENTIL HFA;VENTOLIN HFA) 108 (90 Base) MCG/ACT inhaler Inhale 2 puffs into the lungs every 6 (six) hours as needed for wheezing or shortness of breath. 05/26/15  Yes Marin Olp, MD  atenolol (TENORMIN) 25 MG tablet TAKE ONE TABLET BY MOUTH ONCE DAILY 09/24/14  Yes Marin Olp, MD  betamethasone dipropionate (DIPROLENE) 0.05 % cream Apply 1 application topically 2 (two) times daily as needed (dermatitis).  10/09/14  Yes Historical Provider, MD  cyanocobalamin (,VITAMIN B-12,) 1000 MCG/ML injection Inject 1 mL (1,000 mcg total) into the muscle every 30 (thirty) days. 05/21/15 05/05/16 Yes Marin Olp, MD  Difluprednate (DUREZOL) 0.05 % EMUL Place 1 drop into the right eye every morning.   Yes Historical Provider, MD  dorzolamide-timolol (COSOPT) 22.3-6.8 MG/ML ophthalmic solution Place 1 drop into both eyes 2 (two) times daily.  02/08/13  Yes Historical Provider, MD  ESTRING 2 MG vaginal ring INSERT 1 RING VAGINALLY EVERY 4 MONTHS 05/14/15  Yes Megan Salon, MD  Flaxseed, Linseed, (FLAX SEED OIL PO) Take 1 tablet by mouth daily.   Yes Historical Provider, MD  FLUoxetine (PROZAC) 40 MG capsule TAKE ONE CAPSULE BY MOUTH DAILY 04/07/15  Yes Marin Olp, MD  glimepiride (AMARYL) 4 MG tablet TAKE ONE TABLET BY MOUTH DAILY WITH BREAKFAST. 01/20/15  Yes Marin Olp, MD  lisinopril (PRINIVIL,ZESTRIL) 10 MG tablet TAKE ONE TABLET BY MOUTH DAILY 01/20/15  Yes Marin Olp, MD  Loteprednol Etabonate (LOTEMAX)  0.5 % GEL Place 1 drop into the right eye 2 (two) times daily.   Yes Historical Provider, MD  LUMIGAN 0.01 % SOLN Place 1 drop into both eyes at bedtime.  03/26/14  Yes Historical Provider, MD  meloxicam (MOBIC) 15 MG tablet TAKE ONE TABLET BY  MOUTH ONCE DAILY 04/21/15  Yes Marin Olp, MD  Methylcellulose, Laxative, (CITRUCEL) 500 MG TABS Take 2 tablets by mouth daily after supper.    Yes Historical Provider, MD  Omega-3 Fatty Acids (FISH OIL PO) Take 2 tablets by mouth daily.   Yes Historical Provider, MD  pramipexole (MIRAPEX) 0.5 MG tablet TAKE ONE TABLET BY MOUTH AT BEDTIME 09/24/14  Yes Marin Olp, MD  temazepam (RESTORIL) 30 MG capsule TAKE ONE CAPSULE BY MOUTH AT BEDTIME AS NEEDED FOR SLEEP Patient taking differently: TAKE ONE CAPSULE BY MOUTH AT BEDTIME. 09/30/15  Yes Marin Olp, MD  terconazole (TERAZOL 7) 0.4 % vaginal cream Place 1 applicator vaginally at bedtime. Patient taking differently: Place 1 applicator vaginally daily as needed (problems).  08/28/15  Yes Megan Salon, MD  VICTOZA 18 MG/3ML SOPN INJECT 1.8MG  INTO THE SKIN DAILY. 09/05/15  Yes Marin Olp, MD   BP 142/47 mmHg  Pulse 97  Temp(Src) 98.2 F (36.8 C) (Oral)  Resp 17  Ht 6' (1.829 m)  Wt 110.859 kg  BMI 33.14 kg/m2  SpO2 96%  LMP 05/10/1992 Physical Exam  Constitutional: She is oriented to person, place, and time. She appears well-developed and well-nourished. No distress.  comfortable  HENT:  Head: Normocephalic and atraumatic.  Nose: Nose normal.  Eyes: Conjunctivae are normal.  Neck: Normal range of motion. Neck supple. No tracheal deviation present.  Cardiovascular: Normal rate, regular rhythm and normal heart sounds.   No murmur heard. Pulmonary/Chest: Effort normal and breath sounds normal. No respiratory distress. She has no wheezes. She has no rales.  nml effort and exp phase  Abdominal: Soft. Bowel sounds are normal. She exhibits no distension and no mass. There is tenderness  (LLQ). There is no rebound and no guarding.  Musculoskeletal: Normal range of motion. She exhibits no edema.  Neurological: She is alert and oriented to person, place, and time.  Skin: Skin is warm and dry. No rash noted.  Psychiatric: She has a normal mood and affect.  Nursing note and vitals reviewed.   ED Course  Procedures (including critical care time) Labs Review Labs Reviewed  COMPREHENSIVE METABOLIC PANEL - Abnormal; Notable for the following:    Sodium 133 (*)    Chloride 100 (*)    Glucose, Bld 204 (*)    Albumin 3.4 (*)    All other components within normal limits  URINALYSIS, ROUTINE W REFLEX MICROSCOPIC (NOT AT [facility]) - Abnormal; Notable for the following:    Color, Urine AMBER (*)    APPearance CLOUDY (*)    Ketones, ur 15 (*)    Protein, ur 100 (*)    All other components within normal limits  CBC WITH DIFFERENTIAL/PLATELET - Abnormal; Notable for the following:    WBC 14.1 (*)    Neutro Abs 12.1 (*)    All other components within normal limits  URINE MICROSCOPIC-ADD ON - Abnormal; Notable for the following:    Squamous Epithelial / LPF 0-5 (*)    Bacteria, UA RARE (*)    Casts HYALINE CASTS (*)    All other components within normal limits  LIPASE, BLOOD  COMPREHENSIVE METABOLIC PANEL  CBC    Imaging Review Dg Chest 2 View  10/04/2015  CLINICAL DATA:  Cough and congestion for 3 days EXAM: CHEST  2 VIEW COMPARISON:  08/13/2015 FINDINGS: Cardiac shadow is within normal limits. The lungs are well aerated bilaterally. No focal infiltrate or sizable effusion is seen. Bilateral surgical changes are  noted within the axilla as well as in the left shoulder. No acute bony abnormality seen. IMPRESSION: No acute abnormality noted. Electronically Signed   By: Inez Catalina M.D.   On: 10/04/2015 20:24   Ct Abdomen Pelvis W Contrast  10/05/2015  CLINICAL DATA:  Lower abdominal pain and tenderness. Diarrhea. History diverticulitis. Cholecystectomy. EXAM: CT ABDOMEN AND PELVIS  WITH CONTRAST TECHNIQUE: Multidetector CT imaging of the abdomen and pelvis was performed using the standard protocol following bolus administration of intravenous contrast. CONTRAST:  140mL ISOVUE-300 IOPAMIDOL (ISOVUE-300) INJECTION 61% COMPARISON:  11/11/2013 CT abdomen/ pelvis. FINDINGS: Lower chest: Mild bronchiectasis at the medial left lung base, which appears worsened. Hepatobiliary: Diffuse hepatic steatosis. Normal liver size. No liver mass. Cholecystectomy. No intrahepatic biliary ductal dilatation. Common bile duct diameter 8 mm, within expected post cholecystectomy limits. No radiopaque choledocholithiasis. Pancreas: Normal, with no mass or duct dilation. Spleen: Normal size. No mass. Adrenals/Urinary Tract: Normal adrenals. Normal kidneys with no hydronephrosis and no renal mass. Normal bladder. Stomach/Bowel: Small hiatal hernia. Otherwise collapsed and grossly normal stomach. Normal caliber small bowel with no small bowel wall thickening. Normal appendix . Mild sigmoid diverticulosis. There is wall thickening, 1 wall hyperenhancement and associated pericolonic fat stranding in the proximal sigmoid colon, consistent with acute sigmoid diverticulitis. There is a tiny amount of pericolonic gas anteriorly at the site of diverticulitis without focal fluid collection. Vascular/Lymphatic: Atherosclerotic nonaneurysmal abdominal aorta. Patent portal, splenic, hepatic and renal veins. No pathologically enlarged lymph nodes in the abdomen or pelvis. Reproductive: Grossly normal uterus. No adnexal masses. Vaginal pessary is in place. Other: No ascites. Musculoskeletal: No aggressive appearing focal osseous lesions. Severe degenerative changes in the visualized thoracolumbar spine. Stable chronic moderate L4 compression deformity. IMPRESSION: 1. Acute perforated diverticulitis of the proximal sigmoid colon. No abscess. 2. Additional findings include diffuse hepatic steatosis, small hiatal hernia and mild medial  left lung base bronchiectasis. Electronically Signed   By: Ilona Sorrel M.D.   On: 10/05/2015 00:04   I have personally reviewed and evaluated these images and lab results as part of my medical decision-making.   EKG Interpretation   Date/Time:  Saturday Oct 04 2015 21:33:44 EDT Ventricular Rate:  102 PR Interval:  185 QRS Duration: 88 QT Interval:  349 QTC Calculation: 455 R Axis:   86 Text Interpretation:  Sinus tachycardia Borderline right axis deviation No  significant change since last tracing Confirmed by [facility] MD, [place]  ([id number]) on 10/04/2015 11:24:44 PM      MDM   Final diagnoses:  Acute diverticulitis  Perforated diverticulum of large intestine  Fever and chills    CT proven perforated diverticulitis. Abdomen without signs of peritonitis or systemic toxicity on examination. Surgery consultation. Given IV antibiotics. I reviewed outside urgent care chest x-ray without evidence of pneumonia. Ambulated in the ED maintain O2 sats 94%.  Her lungs are clear to auscultation. Not in COPD exacerbation. Surgery to evaluate and disposition.   Tammy Sours, MD 10/05/15 [id number]  Tammy Sours, MD 10/05/15 [id number]  Gareth Morgan, MD 10/06/15 1302

## 2015-10-05 DIAGNOSIS — E559 Vitamin D deficiency, unspecified: Secondary | ICD-10-CM | POA: Diagnosis present

## 2015-10-05 DIAGNOSIS — R1032 Left lower quadrant pain: Secondary | ICD-10-CM | POA: Diagnosis not present

## 2015-10-05 DIAGNOSIS — M419 Scoliosis, unspecified: Secondary | ICD-10-CM | POA: Diagnosis present

## 2015-10-05 DIAGNOSIS — F329 Major depressive disorder, single episode, unspecified: Secondary | ICD-10-CM | POA: Diagnosis present

## 2015-10-05 DIAGNOSIS — Z885 Allergy status to narcotic agent status: Secondary | ICD-10-CM | POA: Diagnosis not present

## 2015-10-05 DIAGNOSIS — K5792 Diverticulitis of intestine, part unspecified, without perforation or abscess without bleeding: Secondary | ICD-10-CM | POA: Diagnosis present

## 2015-10-05 DIAGNOSIS — H409 Unspecified glaucoma: Secondary | ICD-10-CM | POA: Diagnosis present

## 2015-10-05 DIAGNOSIS — Z79899 Other long term (current) drug therapy: Secondary | ICD-10-CM | POA: Diagnosis not present

## 2015-10-05 DIAGNOSIS — Z853 Personal history of malignant neoplasm of breast: Secondary | ICD-10-CM | POA: Diagnosis not present

## 2015-10-05 DIAGNOSIS — N182 Chronic kidney disease, stage 2 (mild): Secondary | ICD-10-CM | POA: Diagnosis present

## 2015-10-05 DIAGNOSIS — Z96612 Presence of left artificial shoulder joint: Secondary | ICD-10-CM | POA: Diagnosis present

## 2015-10-05 DIAGNOSIS — Z881 Allergy status to other antibiotic agents status: Secondary | ICD-10-CM | POA: Diagnosis not present

## 2015-10-05 DIAGNOSIS — E1122 Type 2 diabetes mellitus with diabetic chronic kidney disease: Secondary | ICD-10-CM | POA: Diagnosis present

## 2015-10-05 DIAGNOSIS — I129 Hypertensive chronic kidney disease with stage 1 through stage 4 chronic kidney disease, or unspecified chronic kidney disease: Secondary | ICD-10-CM | POA: Diagnosis present

## 2015-10-05 DIAGNOSIS — Z888 Allergy status to other drugs, medicaments and biological substances status: Secondary | ICD-10-CM | POA: Diagnosis not present

## 2015-10-05 DIAGNOSIS — Z8249 Family history of ischemic heart disease and other diseases of the circulatory system: Secondary | ICD-10-CM | POA: Diagnosis not present

## 2015-10-05 DIAGNOSIS — K5720 Diverticulitis of large intestine with perforation and abscess without bleeding: Secondary | ICD-10-CM | POA: Diagnosis not present

## 2015-10-05 DIAGNOSIS — K589 Irritable bowel syndrome without diarrhea: Secondary | ICD-10-CM | POA: Diagnosis present

## 2015-10-05 DIAGNOSIS — Z9013 Acquired absence of bilateral breasts and nipples: Secondary | ICD-10-CM | POA: Diagnosis not present

## 2015-10-05 LAB — GLUCOSE, CAPILLARY
GLUCOSE-CAPILLARY: 113 mg/dL — AB (ref 65–99)
GLUCOSE-CAPILLARY: 158 mg/dL — AB (ref 65–99)
GLUCOSE-CAPILLARY: 168 mg/dL — AB (ref 65–99)
Glucose-Capillary: 157 mg/dL — ABNORMAL HIGH (ref 65–99)
Glucose-Capillary: 164 mg/dL — ABNORMAL HIGH (ref 65–99)
Glucose-Capillary: 182 mg/dL — ABNORMAL HIGH (ref 65–99)
Glucose-Capillary: 150 mg/dL — ABNORMAL HIGH (ref 65–99)

## 2015-10-05 LAB — COMPREHENSIVE METABOLIC PANEL
ALT: 20 U/L (ref 14–54)
AST: 20 U/L (ref 15–41)
Albumin: 3 g/dL — ABNORMAL LOW (ref 3.5–5.0)
Alkaline Phosphatase: 60 U/L (ref 38–126)
Anion gap: 9 (ref 5–15)
BILIRUBIN TOTAL: 1.2 mg/dL (ref 0.3–1.2)
BUN: 14 mg/dL (ref 6–20)
CO2: 25 mmol/L (ref 22–32)
CREATININE: 0.73 mg/dL (ref 0.44–1.00)
Calcium: 8.8 mg/dL — ABNORMAL LOW (ref 8.9–10.3)
Chloride: 98 mmol/L — ABNORMAL LOW (ref 101–111)
GFR calc Af Amer: >60 mL/min (ref >60)
Glucose, Bld: 160 mg/dL — ABNORMAL HIGH (ref 65–99)
Potassium: 3.6 mmol/L (ref 3.5–5.1)
Sodium: 132 mmol/L — ABNORMAL LOW (ref 135–145)
TOTAL PROTEIN: 6 g/dL — AB (ref 6.5–8.1)

## 2015-10-05 LAB — CBC
HCT: 37.3 % (ref 36.0–46.0)
Hemoglobin: 11.8 g/dL — ABNORMAL LOW (ref 12.0–15.0)
MCH: 28.6 pg (ref 26.0–34.0)
MCHC: 31.6 g/dL (ref 30.0–36.0)
MCV: 90.5 fL (ref 78.0–100.0)
PLATELETS: 232 10*3/uL (ref 150–400)
RBC: 4.12 MIL/uL (ref 3.87–5.11)
RDW: 14.4 % (ref 11.5–15.5)
WBC: 11 10*3/uL — AB (ref 4.0–10.5)

## 2015-10-05 MED ORDER — METHOCARBAMOL 500 MG PO TABS: 500.0000 mg | ORAL_TABLET | Freq: Four times a day (QID) | ORAL | Status: DC | PRN

## 2015-10-05 MED ORDER — LOTEPREDNOL ETABONATE 0.5 % OP SUSP
1.0000 [drp] | Freq: Two times a day (BID) | OPHTHALMIC | Status: DC
  Administered 2015-10-05 – 2015-10-08 (×8): 1 [drp] via OPHTHALMIC
  Filled 2015-10-05: qty 5

## 2015-10-05 MED ORDER — LISINOPRIL 10 MG PO TABS
10.0000 mg | ORAL_TABLET | Freq: Every day | ORAL | Status: DC
  Administered 2015-10-05 – 2015-10-08 (×4): 10 mg via ORAL
  Filled 2015-10-05 (×4): qty 1

## 2015-10-05 MED ORDER — ACETAMINOPHEN 325 MG PO TABS: 650.0000 mg | ORAL_TABLET | Freq: Four times a day (QID) | ORAL | Status: DC | PRN

## 2015-10-05 MED ORDER — ONDANSETRON HCL 4 MG/2ML IJ SOLN: 4.0000 mg | Freq: Four times a day (QID) | INTRAMUSCULAR | Status: DC | PRN

## 2015-10-05 MED ORDER — TEMAZEPAM 15 MG PO CAPS
30.0000 mg | ORAL_CAPSULE | Freq: Every evening | ORAL | Status: DC | PRN
  Administered 2015-10-05 – 2015-10-07 (×4): 30 mg via ORAL
  Filled 2015-10-05 (×4): qty 2

## 2015-10-05 MED ORDER — PIPERACILLIN-TAZOBACTAM 3.375 G IVPB
3.3750 g | Freq: Three times a day (TID) | INTRAVENOUS | Status: DC
  Administered 2015-10-05 – 2015-10-07 (×7): 3.375 g via INTRAVENOUS
  Filled 2015-10-05 (×9): qty 50

## 2015-10-05 MED ORDER — PRAMIPEXOLE DIHYDROCHLORIDE 0.25 MG PO TABS
0.5000 mg | ORAL_TABLET | Freq: Every day | ORAL | Status: DC
  Administered 2015-10-05 – 2015-10-07 (×4): 0.5 mg via ORAL
  Filled 2015-10-05 (×4): qty 2

## 2015-10-05 MED ORDER — DIFLUPREDNATE 0.05 % OP EMUL: 1.0000 [drp] | OPHTHALMIC | Status: DC

## 2015-10-05 MED ORDER — HYDROMORPHONE HCL 1 MG/ML IJ SOLN: 1.0000 mg | INTRAMUSCULAR | Status: DC | PRN

## 2015-10-05 MED ORDER — PIPERACILLIN-TAZOBACTAM 3.375 G IVPB 30 MIN
3.3750 g | Freq: Once | INTRAVENOUS | Status: AC
  Administered 2015-10-05: 3.375 g via INTRAVENOUS
  Filled 2015-10-05: qty 50

## 2015-10-05 MED ORDER — GLIMEPIRIDE 2 MG PO TABS
2.0000 mg | ORAL_TABLET | Freq: Every day | ORAL | Status: DC
  Administered 2015-10-05 – 2015-10-08 (×4): 2 mg via ORAL
  Filled 2015-10-05 (×4): qty 1

## 2015-10-05 MED ORDER — ACETAMINOPHEN 650 MG RE SUPP: 650.0000 mg | Freq: Four times a day (QID) | RECTAL | Status: DC | PRN

## 2015-10-05 MED ORDER — ATENOLOL 25 MG PO TABS
25.0000 mg | ORAL_TABLET | Freq: Every day | ORAL | Status: DC
  Administered 2015-10-05 – 2015-10-08 (×4): 25 mg via ORAL
  Filled 2015-10-05 (×4): qty 1

## 2015-10-05 MED ORDER — ONDANSETRON 4 MG PO TBDP: 4.0000 mg | ORAL_TABLET | Freq: Four times a day (QID) | ORAL | Status: DC | PRN

## 2015-10-05 MED ORDER — INSULIN ASPART 100 UNIT/ML ~~LOC~~ SOLN
0.0000 [IU] | SUBCUTANEOUS | Status: DC
  Administered 2015-10-05 (×4): 4 [IU] via SUBCUTANEOUS
  Administered 2015-10-05: 3 [IU] via SUBCUTANEOUS
  Administered 2015-10-05: 4 [IU] via SUBCUTANEOUS
  Administered 2015-10-06: 3 [IU] via SUBCUTANEOUS
  Administered 2015-10-06 (×2): 4 [IU] via SUBCUTANEOUS
  Administered 2015-10-06 – 2015-10-07 (×3): 3 [IU] via SUBCUTANEOUS
  Administered 2015-10-07: 4 [IU] via SUBCUTANEOUS
  Administered 2015-10-07 – 2015-10-08 (×2): 3 [IU] via SUBCUTANEOUS

## 2015-10-05 MED ORDER — LATANOPROST 0.005 % OP SOLN
1.0000 [drp] | Freq: Every day | OPHTHALMIC | Status: DC
  Administered 2015-10-05 – 2015-10-07 (×4): 1 [drp] via OPHTHALMIC
  Filled 2015-10-05: qty 2.5

## 2015-10-05 MED ORDER — TRIAMCINOLONE ACETONIDE 0.5 % EX CREA
TOPICAL_CREAM | Freq: Two times a day (BID) | CUTANEOUS | Status: DC | PRN
  Administered 2015-10-05: 03:00:00 via TOPICAL
  Filled 2015-10-05: qty 15

## 2015-10-05 MED ORDER — ALBUTEROL SULFATE (2.5 MG/3ML) 0.083% IN NEBU
2.5000 mg | INHALATION_SOLUTION | Freq: Four times a day (QID) | RESPIRATORY_TRACT | Status: DC | PRN
Start: 2015-10-05 — End: 2015-10-08

## 2015-10-05 MED ORDER — KCL IN DEXTROSE-NACL 20-5-0.9 MEQ/L-%-% IV SOLN
INTRAVENOUS | Status: DC
  Administered 2015-10-05 – 2015-10-06 (×4): via INTRAVENOUS
  Filled 2015-10-05 (×7): qty 1000

## 2015-10-05 MED ORDER — DORZOLAMIDE HCL-TIMOLOL MAL 2-0.5 % OP SOLN
1.0000 [drp] | Freq: Two times a day (BID) | OPHTHALMIC | Status: DC
  Administered 2015-10-05 – 2015-10-08 (×8): 1 [drp] via OPHTHALMIC
  Filled 2015-10-05: qty 10

## 2015-10-05 MED ORDER — ENOXAPARIN SODIUM 40 MG/0.4ML ~~LOC~~ SOLN
40.0000 mg | SUBCUTANEOUS | Status: DC
  Administered 2015-10-05 – 2015-10-08 (×4): 40 mg via SUBCUTANEOUS
  Filled 2015-10-05 (×4): qty 0.4

## 2015-10-05 MED ORDER — FLUOXETINE HCL 20 MG PO CAPS
40.0000 mg | ORAL_CAPSULE | Freq: Every day | ORAL | Status: DC
  Administered 2015-10-05 – 2015-10-08 (×4): 40 mg via ORAL
  Filled 2015-10-05 (×4): qty 2

## 2015-10-05 MED ORDER — HYDRALAZINE HCL 20 MG/ML IJ SOLN
10.0000 mg | INTRAMUSCULAR | Status: DC | PRN
Start: 2015-10-05 — End: 2015-10-08

## 2015-10-05 NOTE — H&P (Signed)
[name] is an 75 y.o. female.   Chief Complaint: abdominal pain HPI: asked to see pt per EDP for 1 day hx of LLQ abdominal pain  No vomiting and some diarrrhea  CT showed diverticulitis with microperforation.  Pain is left lower abdomen sharp made worse with movement or compression.  Pt had a fever   Past Medical History  Diagnosis Date  . 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 Neuropathy ([place])   . Diverticulitis   . Headache(784.0)     Migraines  . Hammer toe   . PONV (postoperative nausea and vomiting)   . Hypertension     takes Atenolol daily  . Joint pain   . Arthritis     right knee;injections every 59month   . Scoliosis   . IBS (irritable bowel syndrome)   . Vitamin D deficiency     takes Vit d daily  . Diabetes mellitus     takes Amaryl and Januvia daily  . Early cataracts, bilateral   . Glaucoma   . Endometriosis   . Gastritis   . Asthma   . COMPRESSION FRACTURE, LUMBAR VERTEBRAE 08/21/2008    Qualifier: Diagnosis of  By: JArnoldo MoraleMD, JBalinda Quails  . Kidney stone   . Osteopenia   . Posterior tibial tendon dysfunction     left foot  . Eczema   . Osteomyelitis ([place]     left 2nd toe  . Difficulty sleeping   . Depression   . Open wound of second toe of left foot   . Cancer ([facility]     HX BREAST CANCER    Past Surgical History  Procedure Laterality Date  . Cholecystectomy  06/2008  . Tonsillectomy    . Thyroidectomy      463[place]. follows endocrine  . Mastectomy Bilateral 1994 right, 1995 left  . Laparoscopic oophorectomy Right 12/2004    absent LSO  . Laparotomy    . Diagnostic laparoscopy    . Adenoidectomy      at age [age]. Excision removed from neck      infected lymph node  . Colonoscopy  11/02/05  . Shoulder hemi-arthroplasty  01/01/2012    Procedure: SHOULDER HEMI-ARTHROPLASTY;  Surgeon: WiRoseanne KaufmanMD;  [facility];  Laterality: Left;  Left Shoulder Hemi Arthroplasty with  Repair and Reconstruction as Necessary   . Endometrial biopsy  06/22/2011    benign polyp, no hyperplasia  . Hysteroscopy  06/22/11    PMB submucosal myoma  . Biopsy on back      benign  . Excision on breast      internal infected suture from breast surgery  . Foot surgery      left foot-cleaning out areas on toes at the wound center-having MRI to determine if osteomyelitis  . Joint replacement      left total shoulder  . Dilation and curettage of uterus    . Total knee arthroplasty Right 12/30/2014    Procedure: RIGHT TOTAL KNEE ARTHROPLASTY;  Surgeon: FrGaynelle ArabianMD;  Location: [facility];  Service: Orthopedics;  Laterality: Right;  . Cataract extraction    . Breast surgery    . Mastectomy      Bilateral    Family History  Problem Relation Age of Onset  . Arthritis Mother   . COPD Father   . Heart disease Father     MI 8510. Hypertension  Father   . Hyperlipidemia Father   . Colon cancer Neg Hx   . Esophageal cancer Neg Hx   . Rectal cancer Neg Hx   . Stomach cancer Neg Hx    Social History:  reports that she has never smoked. She has never used smokeless tobacco. She reports that she does not drink alcohol or use illicit drugs.  Allergies:  Allergies  Allergen Reactions  . Cephalexin Diarrhea    Patient can't remember reaction (per chart at [facility] states diarrhea)  . Erythromycin Ethylsuccinate Hives and Diarrhea  . Levaquin [Levofloxacin In D5w] Other (See Comments)    Pain in tendons   . Nitrofurantoin Other (See Comments)    Severe headache  . Percocet [Oxycodone-Acetaminophen] Itching     (Not in a hospital admission)  Results for orders placed or performed during the hospital encounter of 10/04/15 (from the past 48 hour(s))  Comprehensive metabolic panel     Status: Abnormal   Collection Time: 10/04/15  9:58 PM  Result Value Ref Range   Sodium 133 (L) 135 - 145 mmol/L   Potassium 4.3 3.5 - 5.1 mmol/L   Chloride 100 (L) 101 - 111 mmol/L   CO2 22 22 - 32  mmol/L   Glucose, Bld 204 (H) 65 - 99 mg/dL   BUN 16 6 - 20 mg/dL   Creatinine, Ser 0.80 0.44 - 1.00 mg/dL   Calcium 9.0 8.9 - 10.3 mg/dL   Total Protein 6.8 6.5 - 8.1 g/dL   Albumin 3.4 (L) 3.5 - 5.0 g/dL   AST 23 15 - 41 U/L   ALT 22 14 - 54 U/L   Alkaline Phosphatase 68 38 - 126 U/L   Total Bilirubin 0.6 0.3 - 1.2 mg/dL   GFR calc non Af Amer >60 >60 mL/min   GFR calc Af Amer >60 >60 mL/min    Comment: (NOTE) The eGFR has been calculated using the CKD EPI equation. This calculation has not been validated in all clinical situations. eGFR's persistently <60 mL/min signify possible Chronic Kidney Disease.    Anion gap 11 5 - 15  Lipase, blood     Status: None   Collection Time: 10/04/15  9:58 PM  Result Value Ref Range   Lipase 22 11 - 51 U/L  CBC WITH DIFFERENTIAL     Status: Abnormal   Collection Time: 10/04/15  9:58 PM  Result Value Ref Range   WBC 14.1 (H) 4.0 - 10.5 K/uL   RBC 4.53 3.87 - 5.11 MIL/uL   Hemoglobin 13.0 12.0 - 15.0 g/dL   HCT 40.7 36.0 - 46.0 %   MCV 89.8 78.0 - 100.0 fL   MCH 28.7 26.0 - 34.0 pg   MCHC 31.9 30.0 - 36.0 g/dL   RDW 14.2 11.5 - 15.5 %   Platelets 250 150 - 400 K/uL   Neutrophils Relative % 86 %   Lymphocytes Relative 9 %   Monocytes Relative 5 %   Eosinophils Relative 0 %   Basophils Relative 0 %   Neutro Abs 12.1 (H) 1.7 - 7.7 K/uL   Lymphs Abs 1.3 0.7 - 4.0 K/uL   Monocytes Absolute 0.7 0.1 - 1.0 K/uL   Eosinophils Absolute 0.0 0.0 - 0.7 K/uL   Basophils Absolute 0.0 0.0 - 0.1 K/uL   Smear Review MORPHOLOGY UNREMARKABLE   Urinalysis, Routine w reflex microscopic (not at [facility])     Status: Abnormal   Collection Time: 10/04/15 10:15 PM  Result Value Ref Range  Color, Urine AMBER (A) YELLOW    Comment: BIOCHEMICALS MAY BE AFFECTED BY COLOR   APPearance CLOUDY (A) CLEAR   Specific Gravity, Urine 1.024 1.005 - 1.030   pH 5.5 5.0 - 8.0   Glucose, UA NEGATIVE NEGATIVE mg/dL   Hgb urine dipstick NEGATIVE NEGATIVE   Bilirubin Urine  NEGATIVE NEGATIVE   Ketones, ur 15 (A) NEGATIVE mg/dL   Protein, ur 100 (A) NEGATIVE mg/dL   Nitrite NEGATIVE NEGATIVE   Leukocytes, UA NEGATIVE NEGATIVE  Urine microscopic-add on     Status: Abnormal   Collection Time: 10/04/15 10:15 PM  Result Value Ref Range   Squamous Epithelial / LPF 0-5 (A) NONE SEEN   WBC, UA 0-5 0 - 5 WBC/hpf   RBC / HPF NONE SEEN 0 - 5 RBC/hpf   Bacteria, UA RARE (A) NONE SEEN   Casts HYALINE CASTS (A) NEGATIVE   Dg Chest 2 View  10/04/2015  CLINICAL DATA:  Cough and congestion for 3 days EXAM: CHEST  2 VIEW COMPARISON:  08/13/2015 FINDINGS: Cardiac shadow is within normal limits. The lungs are well aerated bilaterally. No focal infiltrate or sizable effusion is seen. Bilateral surgical changes are noted within the axilla as well as in the left shoulder. No acute bony abnormality seen. IMPRESSION: No acute abnormality noted. Electronically Signed   By: Inez Catalina M.D.   On: 10/04/2015 20:24   Ct Abdomen Pelvis W Contrast  10/05/2015  CLINICAL DATA:  Lower abdominal pain and tenderness. Diarrhea. History diverticulitis. Cholecystectomy. EXAM: CT ABDOMEN AND PELVIS WITH CONTRAST TECHNIQUE: Multidetector CT imaging of the abdomen and pelvis was performed using the standard protocol following bolus administration of intravenous contrast. CONTRAST:  171m ISOVUE-300 IOPAMIDOL (ISOVUE-300) INJECTION 61% COMPARISON:  11/11/2013 CT abdomen/ pelvis. FINDINGS: Lower chest: Mild bronchiectasis at the medial left lung base, which appears worsened. Hepatobiliary: Diffuse hepatic steatosis. Normal liver size. No liver mass. Cholecystectomy. No intrahepatic biliary ductal dilatation. Common bile duct diameter 8 mm, within expected post cholecystectomy limits. No radiopaque choledocholithiasis. Pancreas: Normal, with no mass or duct dilation. Spleen: Normal size. No mass. Adrenals/Urinary Tract: Normal adrenals. Normal kidneys with no hydronephrosis and no renal mass. Normal bladder.  Stomach/Bowel: Small hiatal hernia. Otherwise collapsed and grossly normal stomach. Normal caliber small bowel with no small bowel wall thickening. Normal appendix . Mild sigmoid diverticulosis. There is wall thickening, 1 wall hyperenhancement and associated pericolonic fat stranding in the proximal sigmoid colon, consistent with acute sigmoid diverticulitis. There is a tiny amount of pericolonic gas anteriorly at the site of diverticulitis without focal fluid collection. Vascular/Lymphatic: Atherosclerotic nonaneurysmal abdominal aorta. Patent portal, splenic, hepatic and renal veins. No pathologically enlarged lymph nodes in the abdomen or pelvis. Reproductive: Grossly normal uterus. No adnexal masses. Vaginal pessary is in place. Other: No ascites. Musculoskeletal: No aggressive appearing focal osseous lesions. Severe degenerative changes in the visualized thoracolumbar spine. Stable chronic moderate L4 compression deformity. IMPRESSION: 1. Acute perforated diverticulitis of the proximal sigmoid colon. No abscess. 2. Additional findings include diffuse hepatic steatosis, small hiatal hernia and mild medial left lung base bronchiectasis. Electronically Signed   By: JIlona SorrelM.D.   On: 10/05/2015 00:04    Review of Systems  Constitutional: Positive for fever and malaise/fatigue.  Respiratory: Positive for cough.   Cardiovascular: Negative for chest pain.  Gastrointestinal: Positive for abdominal pain and diarrhea.  Genitourinary: Negative for dysuria.  Musculoskeletal: Negative for myalgias.  Skin: Negative.   Neurological: Negative for dizziness.  Endo/Heme/Allergies: Does not bruise/bleed easily.  Psychiatric/Behavioral: Negative for depression.    Blood pressure 128/49, pulse 95, temperature 98.8 F (37.1 C), temperature source Oral, resp. rate 14, height 6' (1.829 m), weight 111.131 kg (245 lb), last menstrual period 05/10/1992, SpO2 94 %. Physical Exam  Constitutional: She is oriented  to person, place, and time. She appears well-developed and well-nourished. No distress.  HENT:  Head: Normocephalic and atraumatic.  Eyes: Pupils are equal, round, and reactive to light.  Neck: Normal range of motion. Neck supple.  Cardiovascular: Normal rate and regular rhythm.   Respiratory: Effort normal and breath sounds normal. No respiratory distress.  GI: She exhibits distension. There is tenderness in the suprapubic area and left lower quadrant. There is no rigidity, no rebound and no guarding.  Musculoskeletal: Normal range of motion.  Neurological: She is alert and oriented to person, place, and time.  Skin: Skin is warm and dry.  Psychiatric: She has a normal mood and affect. Her behavior is normal. Judgment and thought content normal.     Assessment/Plan Diverticulitis with microperforation  Admit for IV ABX and bowel rest No acute surgical need   Patient Active Problem List   Diagnosis Date Noted  . OA (osteoarthritis) of knee 12/30/2014  . IBS (irritable bowel syndrome) 07/17/2014  . Diabetic ulcer of left foot associated with type 2 diabetes mellitus ([place]) 03/13/2014  . CKD (chronic kidney disease), stage II 01/22/2014  . Depression 01/03/2014  . Posterior tibial tendon dysfunction 01/03/2014  . Osteoarthritis, knee 01/03/2014  . Glaucoma 01/03/2014  . Essential hypertension, benign 01/03/2014  . Obesity (BMI 30-39.9) 06/15/2013  . Foot tendinitis 07/20/2010  . INSOMNIA, CHRONIC 07/25/2009  . Fatty liver 03/18/2009  . GERD 12/25/2007  . Hyperlipidemia 07/26/2007  . ANEMIA, B12 DEFICIENCY 12/29/2006  . Diabetes mellitus type II, controlled ([place]) 12/28/2006  . RESTLESS LEG SYNDROME, MILD 12/28/2006  . NEUROPATHY, IDIOPATHIC PERIPHERAL NEC 12/28/2006  . OSTEOPOROSIS 12/12/2006  . BREAST CANCER, HX OF 12/12/2006    Derel Mcglasson A., MD 10/05/2015, 12:57 AM

## 2015-10-05 NOTE — Progress Notes (Addendum)
Subjective: LLQ soreness.  No n/v.    Objective: Vital signs in last 24 hours: Temp:  [98.2 F (36.8 C)-99.5 F (37.5 C)] 99 F (37.2 C) (05/28 0446) Pulse Rate:  [95-113] 100 (05/28 0446) Resp:  [14-25] 18 (05/28 0446) BP: (112-152)/(47-81) 152/57 mmHg (05/28 0446) SpO2:  [92 %-97 %] 92 % (05/28 0446) Weight:  [110.859 kg (244 lb 6.4 oz)-111.131 kg (245 lb)] 110.859 kg (244 lb 6.4 oz) (05/28 0201) Last BM Date: 10/04/15  Intake/Output from previous day: 05/27 0701 - 05/28 0700 In: 120 [P.O.:120] Out: -  Intake/Output this shift:    PE: General- In NAD Abdomen-soft, mild to moderated LLQ tenderness  Lab Results:   Recent Labs  10/04/15 2158 10/05/15 0527  WBC 14.1* 11.0*  HGB 13.0 11.8*  HCT 40.7 37.3  PLT 250 232   BMET  Recent Labs  10/04/15 2158 10/05/15 0527  NA 133* 132*  K 4.3 3.6  CL 100* 98*  CO2 22 25  GLUCOSE 204* 160*  BUN 16 14  CREATININE 0.80 0.73  CALCIUM 9.0 8.8*   PT/INR No results for input(s): LABPROT, INR in the last 72 hours. Comprehensive Metabolic Panel:    Component Value Date/Time   NA 132* 10/05/2015 0527   NA 133* 10/04/2015 2158   K 3.6 10/05/2015 0527   K 4.3 10/04/2015 2158   CL 98* 10/05/2015 0527   CL 100* 10/04/2015 2158   CO2 25 10/05/2015 0527   CO2 22 10/04/2015 2158   BUN 14 10/05/2015 0527   BUN 16 10/04/2015 2158   CREATININE 0.73 10/05/2015 0527   CREATININE 0.80 10/04/2015 2158   CREATININE 0.75 06/05/2013 1558   CREATININE 0.71 03/05/2011 1724   GLUCOSE 160* 10/05/2015 0527   GLUCOSE 204* 10/04/2015 2158   CALCIUM 8.8* 10/05/2015 0527   CALCIUM 9.0 10/04/2015 2158   AST 20 10/05/2015 0527   AST 23 10/04/2015 2158   ALT 20 10/05/2015 0527   ALT 22 10/04/2015 2158   ALKPHOS 60 10/05/2015 0527   ALKPHOS 68 10/04/2015 2158   BILITOT 1.2 10/05/2015 0527   BILITOT 0.6 10/04/2015 2158   PROT 6.0* 10/05/2015 0527   PROT 6.8 10/04/2015 2158   ALBUMIN 3.0* 10/05/2015 0527   ALBUMIN 3.4*  10/04/2015 2158     Studies/Results: Dg Chest 2 View  10/04/2015  CLINICAL DATA:  Cough and congestion for 3 days EXAM: CHEST  2 VIEW COMPARISON:  08/13/2015 FINDINGS: Cardiac shadow is within normal limits. The lungs are well aerated bilaterally. No focal infiltrate or sizable effusion is seen. Bilateral surgical changes are noted within the axilla as well as in the left shoulder. No acute bony abnormality seen. IMPRESSION: No acute abnormality noted. Electronically Signed   By: Inez Catalina M.D.   On: 10/04/2015 20:24   Ct Abdomen Pelvis W Contrast  10/05/2015  CLINICAL DATA:  Lower abdominal pain and tenderness. Diarrhea. History diverticulitis. Cholecystectomy. EXAM: CT ABDOMEN AND PELVIS WITH CONTRAST TECHNIQUE: Multidetector CT imaging of the abdomen and pelvis was performed using the standard protocol following bolus administration of intravenous contrast. CONTRAST:  131mL ISOVUE-300 IOPAMIDOL (ISOVUE-300) INJECTION 61% COMPARISON:  11/11/2013 CT abdomen/ pelvis. FINDINGS: Lower chest: Mild bronchiectasis at the medial left lung base, which appears worsened. Hepatobiliary: Diffuse hepatic steatosis. Normal liver size. No liver mass. Cholecystectomy. No intrahepatic biliary ductal dilatation. Common bile duct diameter 8 mm, within expected post cholecystectomy limits. No radiopaque choledocholithiasis. Pancreas: Normal, with no mass or duct dilation. Spleen: Normal size. No mass. Adrenals/Urinary  Tract: Normal adrenals. Normal kidneys with no hydronephrosis and no renal mass. Normal bladder. Stomach/Bowel: Small hiatal hernia. Otherwise collapsed and grossly normal stomach. Normal caliber small bowel with no small bowel wall thickening. Normal appendix . Mild sigmoid diverticulosis. There is wall thickening, 1 wall hyperenhancement and associated pericolonic fat stranding in the proximal sigmoid colon, consistent with acute sigmoid diverticulitis. There is a tiny amount of pericolonic gas anteriorly  at the site of diverticulitis without focal fluid collection. Vascular/Lymphatic: Atherosclerotic nonaneurysmal abdominal aorta. Patent portal, splenic, hepatic and renal veins. No pathologically enlarged lymph nodes in the abdomen or pelvis. Reproductive: Grossly normal uterus. No adnexal masses. Vaginal pessary is in place. Other: No ascites. Musculoskeletal: No aggressive appearing focal osseous lesions. Severe degenerative changes in the visualized thoracolumbar spine. Stable chronic moderate L4 compression deformity. IMPRESSION: 1. Acute perforated diverticulitis of the proximal sigmoid colon. No abscess. 2. Additional findings include diffuse hepatic steatosis, small hiatal hernia and mild medial left lung base bronchiectasis. Electronically Signed   By: Ilona Sorrel M.D.   On: 10/05/2015 00:04    Anti-infectives: Anti-infectives    Start     Dose/Rate Route Frequency Ordered Stop   10/05/15 0700  piperacillin-tazobactam (ZOSYN) IVPB 3.375 g     3.375 g 12.5 mL/hr over 240 Minutes Intravenous Every 8 hours 10/05/15 0118     10/05/15 0030  piperacillin-tazobactam (ZOSYN) IVPB 3.375 g     3.375 g 100 mL/hr over 30 Minutes Intravenous  Once 10/05/15 0026 10/05/15 0125      Assessment Principal Problem:   Acute sigmoid diverticulitis-first confirmed episode Active Problems:   Diabetes mellitus type II, controlled (HCC)-cbgs  157-168    LOS: 0 days   Plan: Clear liquids.  IV abxs.  Recheck WBC tomorrow.   [name] 10/05/2015

## 2015-10-06 LAB — GLUCOSE, CAPILLARY
GLUCOSE-CAPILLARY: 127 mg/dL — AB (ref 65–99)
GLUCOSE-CAPILLARY: 176 mg/dL — AB (ref 65–99)
GLUCOSE-CAPILLARY: 148 mg/dL — AB (ref 65–99)
GLUCOSE-CAPILLARY: 142 mg/dL — AB (ref 65–99)
Glucose-Capillary: 169 mg/dL — ABNORMAL HIGH (ref 65–99)

## 2015-10-06 LAB — CBC
HCT: 34.2 % — ABNORMAL LOW (ref 36.0–46.0)
HEMOGLOBIN: 10.7 g/dL — AB (ref 12.0–15.0)
MCH: 28.4 pg (ref 26.0–34.0)
MCHC: 31.3 g/dL (ref 30.0–36.0)
MCV: 90.7 fL (ref 78.0–100.0)
Platelets: 210 10*3/uL (ref 150–400)
RBC: 3.77 MIL/uL — AB (ref 3.87–5.11)
RDW: 14.6 % (ref 11.5–15.5)
WBC: 8.8 10*3/uL (ref 4.0–10.5)

## 2015-10-06 NOTE — Progress Notes (Signed)
Subjective: Feeling much better.     Objective: Vital signs in last 24 hours: Temp:  [98.2 F (36.8 C)-98.9 F (37.2 C)] 98.3 F (36.8 C) (05/29 0622) Pulse Rate:  [86-101] 86 (05/29 0622) Resp:  [18-19] 18 (05/29 0622) BP: (123-144)/(43-66) 142/59 mmHg (05/29 0622) SpO2:  [94 %-96 %] 96 % (05/29 0622) Last BM Date: 10/06/15  Intake/Output from previous day: 05/28 0701 - 05/29 0700 In: 1680 [P.O.:480; I.V.:1200] Out: 775 [Urine:775] Intake/Output this shift:    PE: General- In NAD Abdomen-soft, minimal LLQ tenderness  Lab Results:   Recent Labs  10/05/15 0527 10/06/15 0324  WBC 11.0* 8.8  HGB 11.8* 10.7*  HCT 37.3 34.2*  PLT 232 210   BMET  Recent Labs  10/04/15 2158 10/05/15 0527  NA 133* 132*  K 4.3 3.6  CL 100* 98*  CO2 22 25  GLUCOSE 204* 160*  BUN 16 14  CREATININE 0.80 0.73  CALCIUM 9.0 8.8*   PT/INR No results for input(s): LABPROT, INR in the last 72 hours. Comprehensive Metabolic Panel:    Component Value Date/Time   NA 132* 10/05/2015 0527   NA 133* 10/04/2015 2158   K 3.6 10/05/2015 0527   K 4.3 10/04/2015 2158   CL 98* 10/05/2015 0527   CL 100* 10/04/2015 2158   CO2 25 10/05/2015 0527   CO2 22 10/04/2015 2158   BUN 14 10/05/2015 0527   BUN 16 10/04/2015 2158   CREATININE 0.73 10/05/2015 0527   CREATININE 0.80 10/04/2015 2158   CREATININE 0.75 06/05/2013 1558   CREATININE 0.71 03/05/2011 1724   GLUCOSE 160* 10/05/2015 0527   GLUCOSE 204* 10/04/2015 2158   CALCIUM 8.8* 10/05/2015 0527   CALCIUM 9.0 10/04/2015 2158   AST 20 10/05/2015 0527   AST 23 10/04/2015 2158   ALT 20 10/05/2015 0527   ALT 22 10/04/2015 2158   ALKPHOS 60 10/05/2015 0527   ALKPHOS 68 10/04/2015 2158   BILITOT 1.2 10/05/2015 0527   BILITOT 0.6 10/04/2015 2158   PROT 6.0* 10/05/2015 0527   PROT 6.8 10/04/2015 2158   ALBUMIN 3.0* 10/05/2015 0527   ALBUMIN 3.4* 10/04/2015 2158     Studies/Results: Dg Chest 2 View  10/04/2015  CLINICAL DATA:   Cough and congestion for 3 days EXAM: CHEST  2 VIEW COMPARISON:  08/13/2015 FINDINGS: Cardiac shadow is within normal limits. The lungs are well aerated bilaterally. No focal infiltrate or sizable effusion is seen. Bilateral surgical changes are noted within the axilla as well as in the left shoulder. No acute bony abnormality seen. IMPRESSION: No acute abnormality noted. Electronically Signed   By: Inez Catalina M.D.   On: 10/04/2015 20:24   Ct Abdomen Pelvis W Contrast  10/05/2015  CLINICAL DATA:  Lower abdominal pain and tenderness. Diarrhea. History diverticulitis. Cholecystectomy. EXAM: CT ABDOMEN AND PELVIS WITH CONTRAST TECHNIQUE: Multidetector CT imaging of the abdomen and pelvis was performed using the standard protocol following bolus administration of intravenous contrast. CONTRAST:  151mL ISOVUE-300 IOPAMIDOL (ISOVUE-300) INJECTION 61% COMPARISON:  11/11/2013 CT abdomen/ pelvis. FINDINGS: Lower chest: Mild bronchiectasis at the medial left lung base, which appears worsened. Hepatobiliary: Diffuse hepatic steatosis. Normal liver size. No liver mass. Cholecystectomy. No intrahepatic biliary ductal dilatation. Common bile duct diameter 8 mm, within expected post cholecystectomy limits. No radiopaque choledocholithiasis. Pancreas: Normal, with no mass or duct dilation. Spleen: Normal size. No mass. Adrenals/Urinary Tract: Normal adrenals. Normal kidneys with no hydronephrosis and no renal mass. Normal bladder. Stomach/Bowel: Small hiatal hernia. Otherwise collapsed and  grossly normal stomach. Normal caliber small bowel with no small bowel wall thickening. Normal appendix . Mild sigmoid diverticulosis. There is wall thickening, 1 wall hyperenhancement and associated pericolonic fat stranding in the proximal sigmoid colon, consistent with acute sigmoid diverticulitis. There is a tiny amount of pericolonic gas anteriorly at the site of diverticulitis without focal fluid collection. Vascular/Lymphatic:  Atherosclerotic nonaneurysmal abdominal aorta. Patent portal, splenic, hepatic and renal veins. No pathologically enlarged lymph nodes in the abdomen or pelvis. Reproductive: Grossly normal uterus. No adnexal masses. Vaginal pessary is in place. Other: No ascites. Musculoskeletal: No aggressive appearing focal osseous lesions. Severe degenerative changes in the visualized thoracolumbar spine. Stable chronic moderate L4 compression deformity. IMPRESSION: 1. Acute perforated diverticulitis of the proximal sigmoid colon. No abscess. 2. Additional findings include diffuse hepatic steatosis, small hiatal hernia and mild medial left lung base bronchiectasis. Electronically Signed   By: Ilona Sorrel M.D.   On: 10/05/2015 00:04    Anti-infectives: Anti-infectives    Start     Dose/Rate Route Frequency Ordered Stop   10/05/15 0700  piperacillin-tazobactam (ZOSYN) IVPB 3.375 g     3.375 g 12.5 mL/hr over 240 Minutes Intravenous Every 8 hours 10/05/15 0118     10/05/15 0030  piperacillin-tazobactam (ZOSYN) IVPB 3.375 g     3.375 g 100 mL/hr over 30 Minutes Intravenous  Once 10/05/15 0026 10/05/15 0125      Assessment Principal Problem:   Acute sigmoid diverticulitis-improving on current regimen; WBC normal. Active Problems:   Diabetes mellitus type II, controlled (HCC)-cbgs  113-176    LOS: 1 day   Plan: Advance to full liquids.  Continue IV abxs.   Jadore Veals J 10/06/2015

## 2015-10-06 NOTE — Care Management Note (Signed)
Case Management Note  Patient Details  Name: [name] MRN: [id number] Date of Birth: 1940-11-01  Subjective/Objective:                    Action/Plan:  Diverticulitis with microperforation  Admit for IV ABX and bowel rest No acute surgical need  Expected Discharge Date:                  Expected Discharge Plan:  Home/Self Care  In-House Referral:     Discharge planning Services     Post Acute Care Choice:    Choice offered to:     DME Arranged:    DME Agency:     HH Arranged:    HH Agency:     Status of Service:  In process, will continue to follow  Medicare Important Message Given:    Date Medicare IM Given:    Medicare IM give by:    Date Additional Medicare IM Given:    Additional Medicare Important Message give by:     If discussed at [place] of Stay Meetings, dates discussed:    Additional Comments:  Marilu Favre, RN 10/06/2015, 8:13 AM

## 2015-10-06 NOTE — Progress Notes (Signed)
Pt. Stated BM was a bit watery

## 2015-10-07 ENCOUNTER — Other Ambulatory Visit: Payer: Self-pay | Admitting: *Deleted

## 2015-10-07 LAB — GLUCOSE, CAPILLARY
GLUCOSE-CAPILLARY: 114 mg/dL — AB (ref 65–99)
GLUCOSE-CAPILLARY: 130 mg/dL — AB (ref 65–99)
GLUCOSE-CAPILLARY: 99 mg/dL (ref 65–99)
Glucose-Capillary: 111 mg/dL — ABNORMAL HIGH (ref 65–99)
Glucose-Capillary: 119 mg/dL — ABNORMAL HIGH (ref 65–99)
Glucose-Capillary: 139 mg/dL — ABNORMAL HIGH (ref 65–99)
Glucose-Capillary: 143 mg/dL — ABNORMAL HIGH (ref 65–99)
Glucose-Capillary: 158 mg/dL — ABNORMAL HIGH (ref 65–99)

## 2015-10-07 MED ORDER — SODIUM CHLORIDE 0.9% FLUSH
3.0000 mL | Freq: Two times a day (BID) | INTRAVENOUS | Status: DC
  Administered 2015-10-07 (×2): 3 mL via INTRAVENOUS

## 2015-10-07 MED ORDER — AMOXICILLIN-POT CLAVULANATE 875-125 MG PO TABS
1.0000 | ORAL_TABLET | Freq: Two times a day (BID) | ORAL | Status: DC
  Administered 2015-10-07 – 2015-10-08 (×3): 1 via ORAL
  Filled 2015-10-07 (×3): qty 1

## 2015-10-07 MED ORDER — SODIUM CHLORIDE 0.9% FLUSH: 3.0000 mL | INTRAVENOUS | Status: DC | PRN

## 2015-10-07 MED ORDER — GLUCOSE BLOOD VI STRP: ORAL_STRIP | Status: DC

## 2015-10-07 MED ORDER — SACCHAROMYCES BOULARDII 250 MG PO CAPS
250.0000 mg | ORAL_CAPSULE | Freq: Two times a day (BID) | ORAL | Status: DC
  Administered 2015-10-07 – 2015-10-08 (×3): 250 mg via ORAL
  Filled 2015-10-07 (×3): qty 1

## 2015-10-07 NOTE — Progress Notes (Signed)
Patient ID: [name], female   DOB: [birth year], 75 y.o.   MRN: [id number]     [address] [place], [place] [id number]    Phone: [phone] FAX: [phone]     Subjective: No pain, but still some bloating.  2 loose BMs this AM. No n/v.  Tolerating fulls. Afebrile.  VSS.  Walking in the hallways.   Objective:  Vital signs:  Filed Vitals:   10/06/15 0622 10/06/15 2011 10/07/15 0436 10/07/15 0936  BP: 142/59 132/51 153/73 124/49  Pulse: 86 86 85 65  Temp: 98.3 F (36.8 C) 98.4 F (36.9 C) 98.5 F (36.9 C)   TempSrc: Oral Oral Oral   Resp: 18 17 18    Height:      Weight:      SpO2: 96% 96% 98%     Last BM Date: 10/06/15  Intake/Output   Yesterday:  05/29 0701 - 05/30 0700 In: 3241.7 [P.O.:700; I.V.:2391.7; IV Piggyback:150] Out: 650 [Urine:650] This shift: I/O last 3 completed shifts: In: 4681.7 [P.O.:940; I.V.:3591.7; IV Piggyback:150] Out: [id number] [Urine:925]    Physical Exam: General: Pt awake/alert/oriented x4 in no acute distress Chest: cta.  No chest wall pain w good excursion CV:  Pulses intact.  Regular rhythm MS: Normal AROM mjr joints.  No obvious deformity Abdomen: Soft.  Nondistended.  Non tender.  No evidence of peritonitis.  No incarcerated hernias. Ext:  SCDs BLE.  No mjr edema.  No cyanosis Skin: No petechiae / purpura   Problem List:   Principal Problem:   Acute diverticulitis Active Problems:   Diabetes mellitus type II, controlled ([place])    Results:   Labs: Results for orders placed or performed during the hospital encounter of 10/04/15 (from the past 48 hour(s))  Glucose, capillary     Status: Abnormal   Collection Time: 10/05/15 12:13 PM  Result Value Ref Range   Glucose-Capillary 164 (H) 65 - 99 mg/dL  Glucose, capillary     Status: Abnormal   Collection Time: 10/05/15  4:00 PM  Result Value Ref Range   Glucose-Capillary 182 (H) 65 - 99 mg/dL  Glucose,  capillary     Status: Abnormal   Collection Time: 10/05/15  8:10 PM  Result Value Ref Range   Glucose-Capillary 150 (H) 65 - 99 mg/dL   Comment 1 Notify RN    Comment 2 Document in Chart   Glucose, capillary     Status: Abnormal   Collection Time: 10/05/15 11:56 PM  Result Value Ref Range   Glucose-Capillary 113 (H) 65 - 99 mg/dL   Comment 1 Notify RN    Comment 2 Document in Chart   CBC     Status: Abnormal   Collection Time: 10/06/15  3:24 AM  Result Value Ref Range   WBC 8.8 4.0 - 10.5 K/uL   RBC 3.77 (L) 3.87 - 5.11 MIL/uL   Hemoglobin 10.7 (L) 12.0 - 15.0 g/dL   HCT 34.2 (L) 36.0 - 46.0 %   MCV 90.7 78.0 - 100.0 fL   MCH 28.4 26.0 - 34.0 pg   MCHC 31.3 30.0 - 36.0 g/dL   RDW 14.6 11.5 - 15.5 %   Platelets 210 150 - 400 K/uL  Glucose, capillary     Status: Abnormal   Collection Time: 10/06/15  4:08 AM  Result Value Ref Range   Glucose-Capillary 143 (H) 65 - 99 mg/dL   Comment 1 Notify RN  Comment 2 Document in Chart   Glucose, capillary     Status: Abnormal   Collection Time: 10/06/15  4:19 AM  Result Value Ref Range   Glucose-Capillary 127 (H) 65 - 99 mg/dL   Comment 1 Notify RN    Comment 2 Document in Chart   Glucose, capillary     Status: Abnormal   Collection Time: 10/06/15  8:01 AM  Result Value Ref Range   Glucose-Capillary 176 (H) 65 - 99 mg/dL  Glucose, capillary     Status: Abnormal   Collection Time: 10/06/15 11:34 AM  Result Value Ref Range   Glucose-Capillary 148 (H) 65 - 99 mg/dL  Glucose, capillary     Status: Abnormal   Collection Time: 10/06/15  5:56 PM  Result Value Ref Range   Glucose-Capillary 169 (H) 65 - 99 mg/dL   Comment 1 Notify RN   Glucose, capillary     Status: Abnormal   Collection Time: 10/06/15  8:09 PM  Result Value Ref Range   Glucose-Capillary 142 (H) 65 - 99 mg/dL   Comment 1 Notify RN   Glucose, capillary     Status: Abnormal   Collection Time: 10/06/15 11:59 PM  Result Value Ref Range   Glucose-Capillary 111 (H) 65 -  99 mg/dL  Glucose, capillary     Status: Abnormal   Collection Time: 10/07/15  4:09 AM  Result Value Ref Range   Glucose-Capillary 139 (H) 65 - 99 mg/dL  Glucose, capillary     Status: Abnormal   Collection Time: 10/07/15  8:18 AM  Result Value Ref Range   Glucose-Capillary 130 (H) 65 - 99 mg/dL    Imaging / Studies: No results found.  Medications / Allergies:  Scheduled Meds: . amoxicillin-clavulanate  1 tablet Oral Q12H  . atenolol  25 mg Oral Daily  . dorzolamide-timolol  1 drop Both Eyes BID  . enoxaparin (LOVENOX) injection  40 mg Subcutaneous Q24H  . FLUoxetine  40 mg Oral Daily  . glimepiride  2 mg Oral Q breakfast  . insulin aspart  0-20 Units Subcutaneous Q4H  . latanoprost  1 drop Both Eyes QHS  . lisinopril  10 mg Oral Daily  . loteprednol  1 drop Right Eye BID  . pramipexole  0.5 mg Oral QHS  . saccharomyces boulardii  250 mg Oral BID   Continuous Infusions: . dextrose 5 % and 0.9 % NaCl with KCl 20 mEq/L 100 mL/hr at 10/06/15 2337   PRN Meds:.acetaminophen **OR** acetaminophen, albuterol, hydrALAZINE, HYDROmorphone (DILAUDID) injection, methocarbamol, ondansetron **OR** ondansetron (ZOFRAN) IV, temazepam, triamcinolone cream  Antibiotics: Anti-infectives    Start     Dose/Rate Route Frequency Ordered Stop   10/07/15 1045  amoxicillin-clavulanate (AUGMENTIN) 875-125 MG per tablet 1 tablet     1 tablet Oral Every 12 hours 10/07/15 1037     10/05/15 0700  piperacillin-tazobactam (ZOSYN) IVPB 3.375 g  Status:  Discontinued     3.375 g 12.5 mL/hr over 240 Minutes Intravenous Every 8 hours 10/05/15 0118 10/07/15 1037   10/05/15 0030  piperacillin-tazobactam (ZOSYN) IVPB 3.375 g     3.375 g 100 mL/hr over 30 Minutes Intravenous  Once 10/05/15 0026 10/05/15 0125        Assessment/Plan Acute sigmoid diverticulitis-improving.  Benign abdominal exam.  Advance to a soft diet.  Change zosyn to augmentin and add florastor.  CBC in AM.  Nutrition  consult. -colonoscopy 06/2013-Dr. Olevia Perches FEN-soft diet, sliv DVT prophylaxis-scd/lovenox Dispo-anticipate in AM.  OP f/u Dr. Zella Richer  Erby Pian, [facility] Surgery Pager [phone]) For consults and floor pages call [phone])  10/07/2015 10:59 AM

## 2015-10-07 NOTE — Telephone Encounter (Signed)
Rx sent with diagnosis also.

## 2015-10-07 NOTE — Plan of Care (Signed)
Problem: Food- and Nutrition-Related Knowledge Deficit (NB-1.1) Goal: Nutrition education Formal process to instruct or train a patient/client in a skill or to impart knowledge to help patients/clients voluntarily manage or modify food choices and eating behavior to maintain or improve health. Outcome: Adequate for Discharge Nutrition Education Note  RD consulted for nutrition education regarding diverticulitis.  RD provided "Fiber Restricted Nutrition Therapy" handout from the Academy of [employer]etics. Reviewed patient's dietary recall. Provided examples on ways to decrease fiber intake in diet. Discouraged fresh fruits and vegetables as well as whole grain sources of carbohydrates to minimize fiber intake. Provided specific examples of ways pt could modify food choices to become compliant with diet recommendations.  RD discussed why it is important for patient to adhere to diet recommendations. Also discussed importance of slowly adding back high fiber foods and compliance to a high fiber diet per MD discretion (typically 4-6 weeks) to prevent future flare-ups. Teach back method used.  Expect fair to good compliance.  Body mass index is 33.14 kg/(m^2). Pt meets criteria for obesity, class I based on current BMI.  Current diet order is soft, patient is consuming approximately 50-100% of meals at this time. Labs and medications reviewed. No further nutrition interventions warranted at this time. RD contact information provided. If additional nutrition issues arise, please re-consult RD.   Lilla Callejo A. Jimmye Norman, RD, LDN, CDE Pager: [phone] After hours Pager: [phone]

## 2015-10-07 NOTE — Care Management Important Message (Signed)
Important Message  Patient Details  Name: [name] MRN: [id number] Date of Birth: 29-Mar-1941   Medicare Important Message Given:  Yes    [name] 10/07/2015, 8:05 AM

## 2015-10-08 LAB — CBC
HCT: 32.4 % — ABNORMAL LOW (ref 36.0–46.0)
Hemoglobin: 10.2 g/dL — ABNORMAL LOW (ref 12.0–15.0)
MCH: 28.3 pg (ref 26.0–34.0)
MCHC: 31.5 g/dL (ref 30.0–36.0)
MCV: 90 fL (ref 78.0–100.0)
PLATELETS: 247 10*3/uL (ref 150–400)
RBC: 3.6 MIL/uL — AB (ref 3.87–5.11)
RDW: 14.1 % (ref 11.5–15.5)
WBC: 5.9 10*3/uL (ref 4.0–10.5)

## 2015-10-08 LAB — GLUCOSE, CAPILLARY
GLUCOSE-CAPILLARY: 115 mg/dL — AB (ref 65–99)
GLUCOSE-CAPILLARY: 144 mg/dL — AB (ref 65–99)
Glucose-Capillary: 109 mg/dL — ABNORMAL HIGH (ref 65–99)

## 2015-10-08 MED ORDER — SACCHAROMYCES BOULARDII 250 MG PO CAPS
250.0000 mg | ORAL_CAPSULE | Freq: Two times a day (BID) | ORAL | Status: DC
Start: 2015-10-08 — End: 2016-02-16

## 2015-10-08 MED ORDER — AMOXICILLIN-POT CLAVULANATE 875-125 MG PO TABS: 1.0000 | ORAL_TABLET | Freq: Two times a day (BID) | ORAL | Status: DC

## 2015-10-08 NOTE — Discharge Summary (Signed)
Physician Discharge Summary  [name] [id number] DOB: September 05, 1940 DOA: 10/04/2015  PCP: [name], MD  Consultation: none  Admit date: 10/04/2015 Discharge date: 10/08/2015  Recommendations for Outpatient Follow-up:   Follow-up Information    Follow up with Odis Hollingshead, MD On 10/22/2015.   Specialty:  General Surgery   Why:  arrive by 9AM for a 9:30AM follow up for your diverticulitis    Contact information:   [address] [place] [address] [phone]       Follow up with [name], MD In 2 weeks.   Specialty:  Family Medicine   Contact information:   [address] [place]  [address] [phone]      Discharge Diagnoses:  1. Acute sigmoid diverticulitis   Surgical Procedure: non3  Discharge Condition: stable Disposition: home  Diet recommendation: low fiber  [facility] Weights   10/04/15 2104 10/05/15 0201  Weight: 111.131 kg (245 lb) 110.859 kg (244 lb 6.4 oz)    Filed Vitals:   10/07/15 1937 10/08/15 0356  BP: 149/61 136/49  Pulse: 85 76  Temp: 98.8 F (37.1 C) 98.5 F (36.9 C)  Resp: 18 18      Hospital Course:  [name] is a 75 year old female who presented to [facility] with abdominal pain.  CT showed diverticulitis.  She was admitted for IV antibiotics and bowel rest.  Her symptoms improved and diet was advanced.  She was transitioned from zosyn from Augmentin and florastor.  Nutritionist was consulted for help with diet at discharged.  On HD#4 she was felt stable for discharge home with additional 11 days of antibiotics.  Medication risks, benefits and therapeutic alternatives were reviewed with the patient.  She/he verbalizes understanding.  A follow up was arranged at her request with Dr. Zella Richer.  She was asked to follow up with her PCP as well.  Warning signs that warrant further evaluation were discussed.  Physical Exam: General: Pt awake/alert/oriented x4 in no acute distress Chest: cta. No  chest wall pain w good excursion CV: Pulses intact. Regular rhythm MS: Normal AROM mjr joints. No obvious deformity Abdomen: Soft. Nondistended. Non tender. No evidence of peritonitis. No incarcerated hernias. Ext: SCDs BLE. No mjr edema. No cyanosis Skin: No petechiae / purpura   Discharge Instructions     Medication List    TAKE these medications        albuterol 108 (90 Base) MCG/ACT inhaler  Commonly known as:  PROVENTIL HFA;VENTOLIN HFA  Inhale 2 puffs into the lungs every 6 (six) hours as needed for wheezing or shortness of breath.     amoxicillin-clavulanate 875-125 MG tablet  Commonly known as:  AUGMENTIN  Take 1 tablet by mouth every 12 (twelve) hours.     atenolol 25 MG tablet  Commonly known as:  TENORMIN  TAKE ONE TABLET BY MOUTH ONCE DAILY     betamethasone dipropionate 0.05 % cream  Commonly known as:  DIPROLENE  Apply 1 application topically 2 (two) times daily as needed (dermatitis).     CITRUCEL 500 MG Tabs  Generic drug:  Methylcellulose (Laxative)  Take 2 tablets by mouth daily after supper.     cyanocobalamin 1000 MCG/ML injection  Commonly known as:  (VITAMIN B-12)  Inject 1 mL (1,000 mcg total) into the muscle every 30 (thirty) days.     dorzolamide-timolol 22.3-6.8 MG/ML ophthalmic solution  Commonly known as:  COSOPT  Place 1 drop into both eyes 2 (two) times daily.  DUREZOL 0.05 % Emul  Generic drug:  Difluprednate  Place 1 drop into the right eye every morning.     ESTRING 2 MG vaginal ring  Generic drug:  estradiol  INSERT 1 RING VAGINALLY EVERY 4 MONTHS     FISH OIL PO  Take 2 tablets by mouth daily.     FLAX SEED OIL PO  Take 1 tablet by mouth daily.     FLUoxetine 40 MG capsule  Commonly known as:  PROZAC  TAKE ONE CAPSULE BY MOUTH DAILY     glimepiride 4 MG tablet  Commonly known as:  AMARYL  TAKE ONE TABLET BY MOUTH DAILY WITH BREAKFAST.     glucose blood test strip  Commonly known as:  FREESTYLE LITE   Use as instructed to check blood sugar once a day     lisinopril 10 MG tablet  Commonly known as:  PRINIVIL,ZESTRIL  TAKE ONE TABLET BY MOUTH DAILY     LOTEMAX 0.5 % Gel  Generic drug:  Loteprednol Etabonate  Place 1 drop into the right eye 2 (two) times daily.     LUMIGAN 0.01 % Soln  Generic drug:  bimatoprost  Place 1 drop into both eyes at bedtime.     meloxicam 15 MG tablet  Commonly known as:  MOBIC  TAKE ONE TABLET BY MOUTH ONCE DAILY     pramipexole 0.5 MG tablet  Commonly known as:  MIRAPEX  TAKE ONE TABLET BY MOUTH AT BEDTIME     saccharomyces boulardii 250 MG capsule  Commonly known as:  FLORASTOR  Take 1 capsule (250 mg total) by mouth 2 (two) times daily.     temazepam 30 MG capsule  Commonly known as:  RESTORIL  TAKE ONE CAPSULE BY MOUTH AT BEDTIME AS NEEDED FOR SLEEP     terconazole 0.4 % vaginal cream  Commonly known as:  TERAZOL 7  Place 1 applicator vaginally at bedtime.     VICTOZA 18 MG/3ML Sopn  Generic drug:  Liraglutide  INJECT 1.8MG  INTO THE SKIN DAILY.           Follow-up Information    Follow up with Odis Hollingshead, MD.   Specialty:  General Surgery   Contact information:   [place] [address] [phone]        The results of significant diagnostics from this hospitalization (including imaging, microbiology, ancillary and laboratory) are listed below for reference.    Significant Diagnostic Studies: Dg Chest 2 View  10/04/2015  CLINICAL DATA:  Cough and congestion for 3 days EXAM: CHEST  2 VIEW COMPARISON:  08/13/2015 FINDINGS: Cardiac shadow is within normal limits. The lungs are well aerated bilaterally. No focal infiltrate or sizable effusion is seen. Bilateral surgical changes are noted within the axilla as well as in the left shoulder. No acute bony abnormality seen. IMPRESSION: No acute abnormality noted. Electronically Signed   By: Inez Catalina M.D.   On: 10/04/2015 20:24   Ct Abdomen Pelvis  W Contrast  10/05/2015  CLINICAL DATA:  Lower abdominal pain and tenderness. Diarrhea. History diverticulitis. Cholecystectomy. EXAM: CT ABDOMEN AND PELVIS WITH CONTRAST TECHNIQUE: Multidetector CT imaging of the abdomen and pelvis was performed using the standard protocol following bolus administration of intravenous contrast. CONTRAST:  127mL ISOVUE-300 IOPAMIDOL (ISOVUE-300) INJECTION 61% COMPARISON:  11/11/2013 CT abdomen/ pelvis. FINDINGS: Lower chest: Mild bronchiectasis at the medial left lung base, which appears worsened. Hepatobiliary: Diffuse hepatic steatosis. Normal liver size. No liver mass. Cholecystectomy.  No intrahepatic biliary ductal dilatation. Common bile duct diameter 8 mm, within expected post cholecystectomy limits. No radiopaque choledocholithiasis. Pancreas: Normal, with no mass or duct dilation. Spleen: Normal size. No mass. Adrenals/Urinary Tract: Normal adrenals. Normal kidneys with no hydronephrosis and no renal mass. Normal bladder. Stomach/Bowel: Small hiatal hernia. Otherwise collapsed and grossly normal stomach. Normal caliber small bowel with no small bowel wall thickening. Normal appendix . Mild sigmoid diverticulosis. There is wall thickening, 1 wall hyperenhancement and associated pericolonic fat stranding in the proximal sigmoid colon, consistent with acute sigmoid diverticulitis. There is a tiny amount of pericolonic gas anteriorly at the site of diverticulitis without focal fluid collection. Vascular/Lymphatic: Atherosclerotic nonaneurysmal abdominal aorta. Patent portal, splenic, hepatic and renal veins. No pathologically enlarged lymph nodes in the abdomen or pelvis. Reproductive: Grossly normal uterus. No adnexal masses. Vaginal pessary is in place. Other: No ascites. Musculoskeletal: No aggressive appearing focal osseous lesions. Severe degenerative changes in the visualized thoracolumbar spine. Stable chronic moderate L4 compression deformity. IMPRESSION: 1. Acute  perforated diverticulitis of the proximal sigmoid colon. No abscess. 2. Additional findings include diffuse hepatic steatosis, small hiatal hernia and mild medial left lung base bronchiectasis. Electronically Signed   By: Ilona Sorrel M.D.   On: 10/05/2015 00:04    Microbiology: No results found for this or any previous visit (from the past 240 hour(s)).   Labs: Basic Metabolic Panel:  Recent Labs Lab 10/04/15 2158 10/05/15 0527  NA 133* 132*  K 4.3 3.6  CL 100* 98*  CO2 22 25  GLUCOSE 204* 160*  BUN 16 14  CREATININE 0.80 0.73  CALCIUM 9.0 8.8*   Liver Function Tests:  Recent Labs Lab 10/04/15 2158 10/05/15 0527  AST 23 20  ALT 22 20  ALKPHOS 68 60  BILITOT 0.6 1.2  PROT 6.8 6.0*  ALBUMIN 3.4* 3.0*    Recent Labs Lab 10/04/15 2158  LIPASE 22   No results for input(s): AMMONIA in the last 168 hours. CBC:  Recent Labs Lab 10/04/15 2158 10/05/15 0527 10/06/15 0324 10/08/15 0519  WBC 14.1* 11.0* 8.8 5.9  NEUTROABS 12.1*  --   --   --   HGB 13.0 11.8* 10.7* 10.2*  HCT 40.7 37.3 34.2* 32.4*  MCV 89.8 90.5 90.7 90.0  PLT 250 232 210 247   Cardiac Enzymes: No results for input(s): CKTOTAL, CKMB, CKMBINDEX, TROPONINI in the last 168 hours. BNP: BNP (last 3 results) No results for input(s): BNP in the last 8760 hours.  ProBNP (last 3 results) No results for input(s): PROBNP in the last 8760 hours.  CBG:  Recent Labs Lab 10/07/15 1708 10/07/15 1931 10/07/15 2343 10/08/15 0355 10/08/15 0741  GLUCAP 119* 158* 99 115* 109*    Principal Problem:   Acute diverticulitis Active Problems:   Diabetes mellitus type II, controlled ([place])   Time coordinating discharge: <17min  Signed:  Daisean Brodhead, ANP-BC

## 2015-10-08 NOTE — Progress Notes (Signed)
Discharge instructions reviewed with pt and instructed on where to pick up prescriptions. Pt verbalized understanding and had no questions.  Pt discharged in stable condition via wheelchair with husband.  [name]   

## 2015-10-08 NOTE — Discharge Instructions (Signed)
Diverticulitis °Diverticulitis is inflammation or infection of small pouches in your colon that form when you have a condition called diverticulosis. The pouches in your colon are called diverticula. Your colon, or large intestine, is where water is absorbed and stool is formed. °Complications of diverticulitis can include: °· Bleeding. °· Severe infection. °· Severe pain. °· Perforation of your colon. °· Obstruction of your colon. °CAUSES  °Diverticulitis is caused by bacteria. °Diverticulitis happens when stool becomes trapped in diverticula. This allows bacteria to grow in the diverticula, which can lead to inflammation and infection. °RISK FACTORS °People with diverticulosis are at risk for diverticulitis. Eating a diet that does not include enough fiber from fruits and vegetables may make diverticulitis more likely to develop. °SYMPTOMS  °Symptoms of diverticulitis may include: °· Abdominal pain and tenderness. The pain is normally located on the left side of the abdomen, but may occur in other areas. °· Fever and chills. °· Bloating. °· Cramping. °· Nausea. °· Vomiting. °· Constipation. °· Diarrhea. °· Blood in your stool. °DIAGNOSIS  °Your health care provider will ask you about your medical history and do a physical exam. You may need to have tests done because many medical conditions can cause the same symptoms as diverticulitis. Tests may include: °· Blood tests. °· Urine tests. °· Imaging tests of the abdomen, including X-rays and CT scans. °When your condition is under control, your health care provider may recommend that you have a colonoscopy. A colonoscopy can show how severe your diverticula are and whether something else is causing your symptoms. °TREATMENT  °Most cases of diverticulitis are mild and can be treated at home. Treatment may include: °· Taking over-the-counter pain medicines. °· Following a clear liquid diet. °· Taking antibiotic medicines by mouth for 7-10 days. °More severe cases may  be treated at a hospital. Treatment may include: °· Not eating or drinking. °· Taking prescription pain medicine. °· Receiving antibiotic medicines through an IV tube. °· Receiving fluids and nutrition through an IV tube. °· Surgery. °HOME CARE INSTRUCTIONS  °· Follow your health care provider's instructions carefully. °· Follow a full liquid diet or other diet as directed by your health care provider. After your symptoms improve, your health care provider may tell you to change your diet. He or she may recommend you eat a high-fiber diet. Fruits and vegetables are good sources of fiber. Fiber makes it easier to pass stool. °· Take fiber supplements or probiotics as directed by your health care provider. °· Only take medicines as directed by your health care provider. °· Keep all your follow-up appointments. °SEEK MEDICAL CARE IF:  °· Your pain does not improve. °· You have a hard time eating food. °· Your bowel movements do not return to normal. °SEEK IMMEDIATE MEDICAL CARE IF:  °· Your pain becomes worse. °· Your symptoms do not get better. °· Your symptoms suddenly get worse. °· You have a fever. °· You have repeated vomiting. °· You have bloody or black, tarry stools. °MAKE SURE YOU:  °· Understand these instructions. °· Will watch your condition. °· Will get help right away if you are not doing well or get worse. °  °This information is not intended to replace advice given to you by your health care provider. Make sure you discuss any questions you have with your health care provider. °  °Document Released: 02/03/2005 Document Revised: 05/01/2013 Document Reviewed: 03/21/2013 °Elsevier Interactive Patient Education ©2016 [employer] ° °

## 2015-10-09 ENCOUNTER — Telehealth: Payer: Self-pay | Admitting: General Practice

## 2015-10-09 NOTE — Telephone Encounter (Signed)
Transition Care Management Follow-up Telephone Call   Date discharged? 10/08/2015   How have you been since you were released from the hospital? Doing better.   Do you understand why you were in the hospital? yes   Do you understand the discharge instructions? yes   Where were you discharged to? Home   Items Reviewed:  Medications reviewed: yes  Allergies reviewed: yes  Dietary changes reviewed: yes  Referrals reviewed: yes   Functional Questionnaire:   Activities of Daily Living (ADLs):   She states they are independent in the following: ambulation, bathing and hygiene, feeding, continence, grooming, toileting and dressing States they require assistance with the following: n/a   Any transportation issues/concerns?: no   Any patient concerns? no   Confirmed importance and date/time of follow-up visits scheduled yes  Provider Appointment booked with Dr. Yong Channel on 6/5 @ 4:30.  Confirmed with patient if condition begins to worsen call PCP or go to the ER.  Patient was given the office number and encouraged to call back with question or concerns.  : yes

## 2015-10-11 ENCOUNTER — Other Ambulatory Visit: Payer: Self-pay | Admitting: Family Medicine

## 2015-10-13 ENCOUNTER — Telehealth: Payer: Self-pay | Admitting: Family Medicine

## 2015-10-13 ENCOUNTER — Ambulatory Visit (INDEPENDENT_AMBULATORY_CARE_PROVIDER_SITE_OTHER): Payer: Medicare Other | Admitting: Family Medicine

## 2015-10-13 ENCOUNTER — Encounter: Payer: Self-pay | Admitting: Family Medicine

## 2015-10-13 VITALS — BP 140/78 | HR 76 | Temp 97.9°F | Ht 72.0 in | Wt 243.0 lb

## 2015-10-13 DIAGNOSIS — I6523 Occlusion and stenosis of bilateral carotid arteries: Secondary | ICD-10-CM | POA: Diagnosis not present

## 2015-10-13 DIAGNOSIS — R062 Wheezing: Secondary | ICD-10-CM | POA: Diagnosis not present

## 2015-10-13 DIAGNOSIS — J40 Bronchitis, not specified as acute or chronic: Secondary | ICD-10-CM

## 2015-10-13 DIAGNOSIS — E119 Type 2 diabetes mellitus without complications: Secondary | ICD-10-CM | POA: Diagnosis not present

## 2015-10-13 LAB — POCT GLYCOSYLATED HEMOGLOBIN (HGB A1C): Hemoglobin A1C: 6

## 2015-10-13 MED ORDER — ISOMETHEPTENE-CAFFEINE-APAP 65-20-325 MG PO TABS: ORAL_TABLET | ORAL | Status: DC

## 2015-10-13 MED ORDER — ALBUTEROL SULFATE HFA 108 (90 BASE) MCG/ACT IN AERS: 2.0000 | INHALATION_SPRAY | Freq: Four times a day (QID) | RESPIRATORY_TRACT | Status: DC | PRN

## 2015-10-13 NOTE — Telephone Encounter (Signed)
Pt has been scheduled for today. Pt states she will discuss with you if she needs to keep the Friday appointment, but for  now I have left the Friday hospital follow up appointment as is. Thank you.

## 2015-10-13 NOTE — Telephone Encounter (Signed)
Absolutely to same day- can use 4 30 today (if did not already use for other patient I just messaged you about). Or other.

## 2015-10-13 NOTE — Patient Instructions (Signed)
Refilled albuterol  Continue augmentin  See you on Friday  We will call you within a week about your referral to pulmonology. If you do not hear within 2 weeks, give [name] a call.

## 2015-10-13 NOTE — Telephone Encounter (Signed)
Pt was dc'd from the hospital last wed, 5/31. Pt was in for perforated colon,. However now pt has developed an upper respiratory issue with wheezing and would like to see you asap. Pt refused another provider.  Pt has hospital follow up on Friday, but does not feel this can wait until Friday. Ok to use a same day?

## 2015-10-13 NOTE — Progress Notes (Signed)
Subjective:  [name] is a 75 y.o. year old very pleasant female patient who presents for/with See problem oriented charting ROS- see any ROS included in HPI as well.   Past Medical History-  Patient Active Problem List   Diagnosis Date Noted  . Diabetic ulcer of left foot associated with type 2 diabetes mellitus ([place]) 03/13/2014    Priority: High  . Diabetes mellitus type II, controlled ([place]) 12/28/2006    Priority: High  . IBS (irritable bowel syndrome) 07/17/2014    Priority: Medium  . Depression 01/03/2014    Priority: Medium  . Essential hypertension, benign 01/03/2014    Priority: Medium  . Fatty liver 03/18/2009    Priority: Medium  . Hyperlipidemia 07/26/2007    Priority: Medium  . ANEMIA, B12 DEFICIENCY 12/29/2006    Priority: Medium  . RESTLESS LEG SYNDROME, MILD 12/28/2006    Priority: Medium  . OSTEOPOROSIS 12/12/2006    Priority: Medium  . CKD (chronic kidney disease), stage II 01/22/2014    Priority: Low  . Posterior tibial tendon dysfunction 01/03/2014    Priority: Low  . Osteoarthritis, knee 01/03/2014    Priority: Low  . Glaucoma 01/03/2014    Priority: Low  . Obesity (BMI 30-39.9) 06/15/2013    Priority: Low  . Foot tendinitis 07/20/2010    Priority: Low  . INSOMNIA, CHRONIC 07/25/2009    Priority: Low  . GERD 12/25/2007    Priority: Low  . NEUROPATHY, IDIOPATHIC PERIPHERAL NEC 12/28/2006    Priority: Low  . BREAST CANCER, HX OF 12/12/2006    Priority: Low  . Acute diverticulitis 10/05/2015  . OA (osteoarthritis) of knee 12/30/2014    Medications- reviewed and updated Current Outpatient Prescriptions  Medication Sig Dispense Refill  . albuterol (PROVENTIL HFA;VENTOLIN HFA) 108 (90 Base) MCG/ACT inhaler Inhale 2 puffs into the lungs every 6 (six) hours as needed for wheezing or shortness of breath. 1 Inhaler 2  . amoxicillin-clavulanate (AUGMENTIN) 875-125 MG tablet Take 1 tablet by mouth every 12 (twelve) hours. 22 tablet 0  . atenolol  (TENORMIN) 25 MG tablet TAKE ONE TABLET BY MOUTH ONCE DAILY 90 tablet 3  . betamethasone dipropionate (DIPROLENE) 0.05 % cream Apply 1 application topically 2 (two) times daily as needed (dermatitis).     . cyanocobalamin (,VITAMIN B-12,) 1000 MCG/ML injection Inject 1 mL (1,000 mcg total) into the muscle every 30 (thirty) days. 1 mL 11  . Difluprednate (DUREZOL) 0.05 % EMUL Place 1 drop into the right eye every morning.    . dorzolamide-timolol (COSOPT) 22.3-6.8 MG/ML ophthalmic solution Place 1 drop into both eyes 2 (two) times daily.     [name] ESTRING 2 MG vaginal ring INSERT 1 RING VAGINALLY EVERY 4 MONTHS 1 each 3  . Flaxseed, Linseed, (FLAX SEED OIL PO) Take 1 tablet by mouth daily.    [name] FLUoxetine (PROZAC) 40 MG capsule TAKE ONE CAPSULE BY MOUTH DAILY 90 capsule 1  . glimepiride (AMARYL) 4 MG tablet TAKE ONE TABLET BY MOUTH DAILY WITH BREAKFAST. 90 tablet 3  . glucose blood (FREESTYLE LITE) test strip Use as instructed to check blood sugar once a day 100 each 1  . lisinopril (PRINIVIL,ZESTRIL) 10 MG tablet TAKE ONE TABLET BY MOUTH DAILY 90 tablet 3  . Loteprednol Etabonate (LOTEMAX) 0.5 % GEL Place 1 drop into the right eye 2 (two) times daily.    [name] LUMIGAN 0.01 % SOLN Place 1 drop into both eyes at bedtime.     . meloxicam (MOBIC) 15  MG tablet TAKE ONE TABLET BY MOUTH ONCE DAILY 90 tablet 2  . Methylcellulose, Laxative, (CITRUCEL) 500 MG TABS Take 2 tablets by mouth daily after supper.     . Omega-3 Fatty Acids (FISH OIL PO) Take 2 tablets by mouth daily.    . pramipexole (MIRAPEX) 0.5 MG tablet TAKE ONE TABLET BY MOUTH AT BEDTIME 90 tablet 3  . saccharomyces boulardii (FLORASTOR) 250 MG capsule Take 1 capsule (250 mg total) by mouth 2 (two) times daily. 60 capsule 0  . temazepam (RESTORIL) 30 MG capsule TAKE ONE CAPSULE BY MOUTH AT BEDTIME AS NEEDED FOR SLEEP (Patient taking differently: TAKE ONE CAPSULE BY MOUTH AT BEDTIME.) 30 capsule 0  . terconazole (TERAZOL 7) 0.4 % vaginal cream Place 1  applicator vaginally at bedtime. (Patient taking differently: Place 1 applicator vaginally daily as needed (problems). ) 45 g 0  . VICTOZA 18 MG/3ML SOPN INJECT 1.8MG  INTO THE SKIN DAILY. 9 pen 6  . Isometheptene-Caffeine-APAP 65-20-325 MG TABS Take 1-2 tablets as needed for migraine twice a day maximum 30 tablet 5   No current facility-administered medications for this visit.    Objective: BP 140/78 mmHg  Pulse 76  Temp(Src) 97.9 F (36.6 C) (Oral)  Ht 6' (1.829 m)  Wt 243 lb (110.224 kg)  BMI 32.95 kg/m2  SpO2 94%  LMP 05/10/1992 Gen: NAD, resting comfortably, Occasional hacking cough Oropharynx normal, nares normal CV: RRR no murmurs rubs or gallops Lungs: Diffuse rhonchi and wheeze. No crackles  Abdomen: soft/nontender/nondistended/normal bowel sounds. Obese Ext: no edema Skin: warm, dry, no rash Neuro: grossly normal, moves all extremities  Assessment/Plan:  Recurrent bronchitis S: This is now the fourth or fifth episode this year where patient has presented with cough, mild shortness of breath and wheeze. Previously had been healthy from respiratory standpoint. She seems to be more sick sense having exposure to her grandson who is 7. On prior visits as well as Today, she has rhonchi on exam as well as wheeze. 2 separate x-rays have shown no pneumonia.. She states she has had 2 weeks of wheeze at least. Nebulizer in urgent care but not in hospital. She was hospitalized last week for diverticulitis but was having these symptoms during that time and has upcoming TCM visit on Friday but was concerned about her breathing pattern. She could not find her inhaler which usually helps her. Not on oxygen. About the same since starting antibiotics if not slightly worse-she had been on Zosyn and is now on Augmentin and Augmentin previously helped her with similar illness. Also had migraine with recent illness and requests a refill on her prodrin. ROS-no fever, chills, nausea, vomiting, lower  abdominal tenderness. A/P: Discussed once again that most bronchitis is viral. She is already on Augmentin and would not change antibiotics. Her primary request today is for refill on albuterol. We refill this. We also provided a pulmonology referral given 4- 5 episodes of bronchitis within a 6 month for consideration of pulmonary function testing although she is not former smoker.  Return precautions advised.   Orders Placed This Encounter  Procedures  . Ambulatory referral to Pulmonology    Referral Priority:  Routine    Referral Type:  Consultation    Referral Reason:  Specialty Services Required    Requested Specialty:  Pulmonary Disease    Number of Visits Requested:  1  . POCT glycosylated hemoglobin (Hb A1C)    Meds ordered this encounter  Medications  . albuterol (PROVENTIL HFA;VENTOLIN HFA) 108 (90  Base) MCG/ACT inhaler    Sig: Inhale 2 puffs into the lungs every 6 (six) hours as needed for wheezing or shortness of breath.    Dispense:  1 Inhaler    Refill:  2  . Isometheptene-Caffeine-APAP 65-20-325 MG TABS    Sig: Take 1-2 tablets as needed for migraine twice a day maximum    Dispense:  30 tablet    Refill:  5   The duration of face-to-face time during this visit was 25 minutes. Greater than 50% of this time was spent in counseling, explanation of diagnosis, planning of further management, and/or coordination of care.   [name], MD

## 2015-10-14 ENCOUNTER — Ambulatory Visit (HOSPITAL_COMMUNITY)
Admission: RE | Admit: 2015-10-14 | Discharge: 2015-10-14 | Disposition: A | Discharge status: Home / Self Care | Payer: Medicare Other | Source: Ambulatory Visit | Attending: Cardiovascular Disease | Admitting: Cardiovascular Disease

## 2015-10-14 DIAGNOSIS — I10 Essential (primary) hypertension: Secondary | ICD-10-CM | POA: Diagnosis not present

## 2015-10-14 DIAGNOSIS — E1142 Type 2 diabetes mellitus with diabetic polyneuropathy: Secondary | ICD-10-CM | POA: Diagnosis not present

## 2015-10-14 DIAGNOSIS — F329 Major depressive disorder, single episode, unspecified: Secondary | ICD-10-CM | POA: Diagnosis not present

## 2015-10-14 DIAGNOSIS — I6523 Occlusion and stenosis of bilateral carotid arteries: Secondary | ICD-10-CM | POA: Diagnosis not present

## 2015-10-15 DIAGNOSIS — E1151 Type 2 diabetes mellitus with diabetic peripheral angiopathy without gangrene: Secondary | ICD-10-CM | POA: Diagnosis not present

## 2015-10-15 DIAGNOSIS — L603 Nail dystrophy: Secondary | ICD-10-CM | POA: Diagnosis not present

## 2015-10-15 DIAGNOSIS — L84 Corns and callosities: Secondary | ICD-10-CM | POA: Diagnosis not present

## 2015-10-15 DIAGNOSIS — I739 Peripheral vascular disease, unspecified: Secondary | ICD-10-CM | POA: Diagnosis not present

## 2015-10-15 NOTE — Telephone Encounter (Signed)
Appt 10/17/15, discuss refill at that time

## 2015-10-16 ENCOUNTER — Other Ambulatory Visit: Payer: Self-pay | Admitting: General Practice

## 2015-10-16 MED ORDER — FLUOXETINE HCL 40 MG PO CAPS
40.0000 mg | ORAL_CAPSULE | Freq: Every day | ORAL | Status: DC
Start: 2015-10-16 — End: 2015-10-17

## 2015-10-17 ENCOUNTER — Ambulatory Visit (INDEPENDENT_AMBULATORY_CARE_PROVIDER_SITE_OTHER): Payer: Medicare Other | Admitting: Family Medicine

## 2015-10-17 ENCOUNTER — Encounter: Payer: Self-pay | Admitting: Family Medicine

## 2015-10-17 VITALS — BP 148/72 | HR 84 | Temp 98.0°F | Ht 72.0 in

## 2015-10-17 DIAGNOSIS — K5792 Diverticulitis of intestine, part unspecified, without perforation or abscess without bleeding: Secondary | ICD-10-CM

## 2015-10-17 DIAGNOSIS — Z5189 Encounter for other specified aftercare: Secondary | ICD-10-CM | POA: Diagnosis not present

## 2015-10-17 DIAGNOSIS — J40 Bronchitis, not specified as acute or chronic: Secondary | ICD-10-CM | POA: Diagnosis not present

## 2015-10-17 MED ORDER — FLUOXETINE HCL 40 MG PO CAPS: 40.0000 mg | ORAL_CAPSULE | Freq: Every day | ORAL | Status: DC

## 2015-10-17 NOTE — Patient Instructions (Signed)
Very pleased with your progress! Lungs are clearer, abdominal pain better. Finish antibiotics, move albuterol to as needed  Call us with any change in your status

## 2015-10-17 NOTE — Progress Notes (Signed)
Pre visit review using our clinic review tool, if applicable. No additional management support is needed unless otherwise documented below in the visit note. 

## 2015-10-17 NOTE — Progress Notes (Signed)
Subjective:  [name] is a 75 y.o. year old very pleasant female patient who presents for transitional care management and hospital follow up for diverticulitis with perforation. Patient was hospitalized from 10/04/15 to 10/08/15. A TCM phone call was completed on 10/09/15. Medical complexity medium  Patient presented to [facility] urgent care on 10/04/15 with complaints of SOB, congestion, fever for 3 days. History of recurrent bronchitis this year. Patient had primarily just been sitting in her recliner for 3 days. She also noted some lowre abdoiminal pain with severe diarrhea on the day of the visit. She was found to be wheezing and sats 92%- albuterol was not helping so decision made to transfer to [facility]. At [facility], CT abd/pelvis done and showing " 1. Acute perforated diverticulitis of the proximal sigmoid colon. No abscess. 2. Additional findings include diffuse hepatic steatosis, small hiatal hernia and mild medial left lung base bronchiectasis.".   She has known about fatty livr for some time. Patient was treated with IV zosyn and bowel rest with slow advancement of diet. She was transitioned to augmentin and also placed on florastor before discharge with 14 day antibiotic course planned. She has follow up with Dr. Zella Richer planned but has not seen yet. She was seen by me earlier this week for acute bronchitis like symptoms and had run out of albuterol. We restarted albuterol but did not place on antibiotic as already on augmentin and high probability of viral cause for viral symptoms. Did place a referral to pulmonary given several cases of bronchitis this year (at least 3-4) and bronchiectasis noted on CT. Patient states her abdominal pain has completely resolved. She will finish antibiotics on Sunday. She has been able to advance her diet. She states from pulmonary perspective that albuterol does not help much and not improving but we noted during visit much less frequent cough frequency, and  she appears more comfortable ovrall and with no wheeze on exam- after examination she admits that perhaps she has made significant improvements.    Past Medical History-  Patient Active Problem List   Diagnosis Date Noted  . Diabetic ulcer of left foot associated with type 2 diabetes mellitus ([place]) 03/13/2014    Priority: High  . Diabetes mellitus type II, controlled ([place]) 12/28/2006    Priority: High  . IBS (irritable bowel syndrome) 07/17/2014    Priority: Medium  . Depression 01/03/2014    Priority: Medium  . Essential hypertension, benign 01/03/2014    Priority: Medium  . Fatty liver 03/18/2009    Priority: Medium  . Hyperlipidemia 07/26/2007    Priority: Medium  . ANEMIA, B12 DEFICIENCY 12/29/2006    Priority: Medium  . RESTLESS LEG SYNDROME, MILD 12/28/2006    Priority: Medium  . OSTEOPOROSIS 12/12/2006    Priority: Medium  . CKD (chronic kidney disease), stage II 01/22/2014    Priority: Low  . Posterior tibial tendon dysfunction 01/03/2014    Priority: Low  . Osteoarthritis, knee 01/03/2014    Priority: Low  . Glaucoma 01/03/2014    Priority: Low  . Obesity (BMI 30-39.9) 06/15/2013    Priority: Low  . Foot tendinitis 07/20/2010    Priority: Low  . INSOMNIA, CHRONIC 07/25/2009    Priority: Low  . GERD 12/25/2007    Priority: Low  . NEUROPATHY, IDIOPATHIC PERIPHERAL NEC 12/28/2006    Priority: Low  . BREAST CANCER, HX OF 12/12/2006    Priority: Low  . Bronchitis 10/18/2015  . Acute diverticulitis 10/05/2015  . OA (osteoarthritis)  of knee 12/30/2014    Medications- reviewed and updated Current Outpatient Prescriptions  Medication Sig Dispense Refill  . albuterol (PROVENTIL HFA;VENTOLIN HFA) 108 (90 Base) MCG/ACT inhaler Inhale 2 puffs into the lungs every 6 (six) hours as needed for wheezing or shortness of breath. 1 Inhaler 2  . amoxicillin-clavulanate (AUGMENTIN) 875-125 MG tablet Take 1 tablet by mouth every 12 (twelve) hours. 22 tablet 0  . atenolol  (TENORMIN) 25 MG tablet TAKE ONE TABLET BY MOUTH ONCE DAILY 90 tablet 3  . betamethasone dipropionate (DIPROLENE) 0.05 % cream Apply 1 application topically 2 (two) times daily as needed (dermatitis).     . cyanocobalamin (,VITAMIN B-12,) 1000 MCG/ML injection Inject 1 mL (1,000 mcg total) into the muscle every 30 (thirty) days. 1 mL 11  . Difluprednate (DUREZOL) 0.05 % EMUL Place 1 drop into the right eye every morning.    . dorzolamide-timolol (COSOPT) 22.3-6.8 MG/ML ophthalmic solution Place 1 drop into both eyes 2 (two) times daily.     [name] ESTRING 2 MG vaginal ring INSERT 1 RING VAGINALLY EVERY 4 MONTHS 1 each 3  . Flaxseed, Linseed, (FLAX SEED OIL PO) Take 1 tablet by mouth daily.    [name] FLUoxetine (PROZAC) 40 MG capsule Take 1 capsule (40 mg total) by mouth daily. 90 capsule 3  . glimepiride (AMARYL) 4 MG tablet TAKE ONE TABLET BY MOUTH DAILY WITH BREAKFAST. 90 tablet 3  . glucose blood (FREESTYLE LITE) test strip Use as instructed to check blood sugar once a day 100 each 1  . Isometheptene-Caffeine-APAP 65-20-325 MG TABS Take 1-2 tablets as needed for migraine twice a day maximum 30 tablet 5  . lisinopril (PRINIVIL,ZESTRIL) 10 MG tablet TAKE ONE TABLET BY MOUTH DAILY 90 tablet 3  . Loteprednol Etabonate (LOTEMAX) 0.5 % GEL Place 1 drop into the right eye 2 (two) times daily.    [name] LUMIGAN 0.01 % SOLN Place 1 drop into both eyes at bedtime.     . meloxicam (MOBIC) 15 MG tablet TAKE ONE TABLET BY MOUTH ONCE DAILY 90 tablet 2  . Methylcellulose, Laxative, (CITRUCEL) 500 MG TABS Take 2 tablets by mouth daily after supper.     . Omega-3 Fatty Acids (FISH OIL PO) Take 2 tablets by mouth daily.    . pramipexole (MIRAPEX) 0.5 MG tablet TAKE ONE TABLET BY MOUTH AT BEDTIME 90 tablet 3  . saccharomyces boulardii (FLORASTOR) 250 MG capsule Take 1 capsule (250 mg total) by mouth 2 (two) times daily. 60 capsule 0  . temazepam (RESTORIL) 30 MG capsule TAKE ONE CAPSULE BY MOUTH AT BEDTIME AS NEEDED FOR SLEEP  (Patient taking differently: TAKE ONE CAPSULE BY MOUTH AT BEDTIME.) 30 capsule 0  . terconazole (TERAZOL 7) 0.4 % vaginal cream Place 1 applicator vaginally at bedtime. (Patient taking differently: Place 1 applicator vaginally daily as needed (problems). ) 45 g 0  . VICTOZA 18 MG/3ML SOPN INJECT 1.8MG  INTO THE SKIN DAILY. 9 pen 6   No current facility-administered medications for this visit.    Objective: BP 148/72 mmHg  Pulse 84  Temp(Src) 98 F (36.7 C) (Oral)  Ht 6' (1.829 m)  SpO2 97%  LMP 05/10/1992 Gen: NAD, resting comfortably, appears less fatigued CV: RRR no murmurs rubs or gallops Lungs: CTAB no crackles, wheeze, rhonchi Abdomen: soft/nontender/nondistended/normal bowel sounds. No rebound or guarding. obese Ext: no edema Skin: warm, dry Neuro: grossly normal, moves all extremities, needs assist by hand to get on table  Assessment/Plan:  Acute diverticulitis Patient's symptoms have  completely resolved. She did have perforation noted but no signs of worsening or untreated condition today. She will finish 14 days of augmentin and is aware to follow up for new or worsening symptoms. Did encourage her to follow through with surgery follow up  Bronchitis Patient episodes of bronchitis included notes from 10/04/15. 08/15/15. 06/11/15- thought this was more of sinusitis. 05/26/15. She is improving from most recent episode with no wheeze on exam and improved cough frequency. Other concern is the "medial left lung base bronchiectasis on CT". She has scheduled to see Dr. Lake Bells later this month to discuss her case further. Return precautions given    Return in about 4 months (around 02/16/2016). Return precautions advised.   Meds ordered this encounter  Medications  . FLUoxetine (PROZAC) 40 MG capsule    Sig: Take 1 capsule (40 mg total) by mouth daily.    Dispense:  90 capsule    Refill:  3    [name], MD

## 2015-10-18 DIAGNOSIS — J479 Bronchiectasis, uncomplicated: Secondary | ICD-10-CM | POA: Insufficient documentation

## 2015-10-18 NOTE — Assessment & Plan Note (Signed)
Patient's symptoms have completely resolved. She did have perforation noted but no signs of worsening or untreated condition today. She will finish 14 days of augmentin and is aware to follow up for new or worsening symptoms. Did encourage her to follow through with surgery follow up

## 2015-10-18 NOTE — Assessment & Plan Note (Signed)
Patient episodes of bronchitis included notes from 10/04/15. 08/15/15. 06/11/15- thought this was more of sinusitis. 05/26/15. She is improving from most recent episode with no wheeze on exam and improved cough frequency. Other concern is the "medial left lung base bronchiectasis on CT". She has scheduled to see Dr. Lake Bells later this month to discuss her case further. Return precautions given

## 2015-10-22 ENCOUNTER — Telehealth: Payer: Self-pay | Admitting: Family Medicine

## 2015-10-22 DIAGNOSIS — K5792 Diverticulitis of intestine, part unspecified, without perforation or abscess without bleeding: Secondary | ICD-10-CM | POA: Diagnosis not present

## 2015-10-22 NOTE — Telephone Encounter (Signed)
Pt need dr.Hunter to send authorization to the pharmacy for her fluoxetine 40 MG Capsule that was lost. Need asap send to [place].

## 2015-10-23 NOTE — Telephone Encounter (Signed)
Sopke with patient for clearification, Pt's husband picked up the Rx on 10/16/15, lost the filled bottle of medication.  Advised pt that she would have to pay cost for another Rx but there are refills at that pharmacy available, she'll just need to contact them to let them know she's willing to pay cash.

## 2015-10-30 DIAGNOSIS — H2512 Age-related nuclear cataract, left eye: Secondary | ICD-10-CM | POA: Diagnosis not present

## 2015-10-31 ENCOUNTER — Encounter: Payer: Self-pay | Admitting: Obstetrics & Gynecology

## 2015-10-31 ENCOUNTER — Ambulatory Visit (INDEPENDENT_AMBULATORY_CARE_PROVIDER_SITE_OTHER): Payer: Medicare Other | Admitting: Obstetrics & Gynecology

## 2015-10-31 VITALS — BP 108/58 | HR 88 | Resp 16

## 2015-10-31 DIAGNOSIS — B373 Candidiasis of vulva and vagina: Secondary | ICD-10-CM

## 2015-10-31 DIAGNOSIS — I6523 Occlusion and stenosis of bilateral carotid arteries: Secondary | ICD-10-CM | POA: Diagnosis not present

## 2015-10-31 DIAGNOSIS — B3731 Acute candidiasis of vulva and vagina: Secondary | ICD-10-CM

## 2015-10-31 DIAGNOSIS — N952 Postmenopausal atrophic vaginitis: Secondary | ICD-10-CM

## 2015-10-31 MED ORDER — TERCONAZOLE 0.4 % VA CREA: 1.0000 | TOPICAL_CREAM | Freq: Every day | VAGINAL | Status: DC

## 2015-10-31 MED ORDER — NYSTATIN 100000 UNIT/GM EX CREA: 1.0000 "application " | TOPICAL_CREAM | Freq: Two times a day (BID) | CUTANEOUS | Status: DC

## 2015-10-31 MED ORDER — FLUCONAZOLE 150 MG PO TABS: ORAL_TABLET | ORAL | Status: DC

## 2015-11-01 ENCOUNTER — Other Ambulatory Visit: Payer: Self-pay | Admitting: Family Medicine

## 2015-11-01 NOTE — Progress Notes (Signed)
GYNECOLOGY  VISIT   HPI: 75 y.o. G0P0 Married Caucasian female here for Estring removal and replacement.  She cannot do this on her own.  Reports having perforated diverticula in late May.  Was hospitalized and on antibiotics, IV, for four days.  Then treated with outpatient antibiotics for another 10 days.  She then had a Bronchitis in June and was on antibiotics again.  She gets a yeast infection with almost any antibiotics and states "it's a mess down there".  She's having itching and discharge.  Denies vaginal bleeding.  Denies dysuria, urgency or hesitancy.    Patient's last menstrual period was 05/10/1992.    Patient Active Problem List   Diagnosis Date Noted  . Bronchitis 10/18/2015  . Acute diverticulitis 10/05/2015  . OA (osteoarthritis) of knee 12/30/2014  . IBS (irritable bowel syndrome) 07/17/2014  . Diabetic ulcer of left foot associated with type 2 diabetes mellitus ([place]) 03/13/2014  . CKD (chronic kidney disease), stage II 01/22/2014  . Depression 01/03/2014  . Posterior tibial tendon dysfunction 01/03/2014  . Osteoarthritis, knee 01/03/2014  . Glaucoma 01/03/2014  . Essential hypertension, benign 01/03/2014  . Obesity (BMI 30-39.9) 06/15/2013  . Foot tendinitis 07/20/2010  . INSOMNIA, CHRONIC 07/25/2009  . Fatty liver 03/18/2009  . GERD 12/25/2007  . Hyperlipidemia 07/26/2007  . ANEMIA, B12 DEFICIENCY 12/29/2006  . Diabetes mellitus type II, controlled ([place]) 12/28/2006  . RESTLESS LEG SYNDROME, MILD 12/28/2006  . NEUROPATHY, IDIOPATHIC PERIPHERAL NEC 12/28/2006  . OSTEOPOROSIS 12/12/2006  . BREAST CANCER, HX OF 12/12/2006    Past Medical History  Diagnosis Date  . 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 Neuropathy ([place])   . Diverticulitis   . Headache(784.0)     Migraines  . Hammer toe   . PONV (postoperative nausea and vomiting)   . Hypertension     takes Atenolol daily  . Joint pain   . Arthritis     right  knee;injections every 33months   . Scoliosis   . IBS (irritable bowel syndrome)   . Vitamin D deficiency     takes Vit d daily  . Diabetes mellitus     takes Amaryl and Januvia daily  . Early cataracts, bilateral   . Glaucoma   . Endometriosis   . Gastritis   . Asthma   . COMPRESSION FRACTURE, LUMBAR VERTEBRAE 08/21/2008    Qualifier: Diagnosis of  By: Arnoldo Morale MD, Balinda Quails   . Kidney stone   . Osteopenia   . Posterior tibial tendon dysfunction     left foot  . Eczema   . Osteomyelitis ([place])     left 2nd toe  . Difficulty sleeping   . Depression   . Open wound of second toe of left foot   . Cancer [facility])     HX BREAST CANCER    Past Surgical History  Procedure Laterality Date  . Cholecystectomy  06/2008  . Tonsillectomy    . Thyroidectomy      40yrs ago. follows endocrine  . Mastectomy Bilateral 1994 right, 1995 left  . Laparoscopic oophorectomy Right 12/2004    absent LSO  . Laparotomy    . Diagnostic laparoscopy    . Adenoidectomy      at age 42  . Excision removed from neck      infected lymph node  . Colonoscopy  11/02/05  . Shoulder hemi-arthroplasty  01/01/2012    Procedure: SHOULDER HEMI-ARTHROPLASTY;  Surgeon: Roseanne Kaufman,  MD;  Location: [facility];  Service: Orthopedics;  Laterality: Left;  Left Shoulder Hemi Arthroplasty with Repair and Reconstruction as Necessary   . Endometrial biopsy  06/22/2011    benign polyp, no hyperplasia  . Hysteroscopy  06/22/11    PMB submucosal myoma  . Biopsy on back      benign  . Excision on breast      internal infected suture from breast surgery  . Foot surgery      left foot-cleaning out areas on toes at the wound center-having MRI to determine if osteomyelitis  . Joint replacement      left total shoulder  . Dilation and curettage of uterus    . Total knee arthroplasty Right 12/30/2014    Procedure: RIGHT TOTAL KNEE ARTHROPLASTY;  Surgeon: Gaynelle Arabian, MD;  Location: [facility];  Service: Orthopedics;  Laterality: Right;  .  Cataract extraction    . Breast surgery    . Mastectomy      Bilateral    MEDS:  Reviewed in EPIC and UTD  ALLERGIES: Cephalexin; Erythromycin ethylsuccinate; Levaquin; Nitrofurantoin; and Percocet  Family History  Problem Relation Age of Onset  . Arthritis Mother   . COPD Father   . Heart disease Father     MI 63  . Hypertension Father   . Hyperlipidemia Father   . Colon cancer Neg Hx   . Esophageal cancer Neg Hx   . Rectal cancer Neg Hx   . Stomach cancer Neg Hx     SH:  Married, non smoker  Review of Systems  All other systems reviewed and are negative.   PHYSICAL EXAMINATION:    BP 108/58 mmHg  Pulse 88  Resp 16  Wt   LMP 05/10/1992    Physical Exam  Constitutional: She appears well-developed and well-nourished.  GI: Soft. Bowel sounds are normal. She exhibits no distension. There is no tenderness. There is no rebound and no guarding.  Genitourinary: There is no rash, tenderness, lesion or injury on the right labia. There is no rash, tenderness, lesion or injury on the left labia. There is erythema in the vagina. Vaginal discharge found.  Estring removed without difficulty.  Significant whitish discharge noted.  Lymphadenopathy:       Right: No inguinal adenopathy present.   Wet smear:  Ph: 5.0, KOH with yeast.  Assessment: Atrophic vaginal findings Yeast vaginitis Estring replaced today  Plan: Pt given rx for Diflucan 150mg  po x 1, repeat in 72 hours for two more doses. Nystatin powder for external skin creases given. As well, Terazol 7 qhs x 7 days given.  She can use this for external itching as well.

## 2015-11-03 ENCOUNTER — Other Ambulatory Visit: Payer: Self-pay | Admitting: Family Medicine

## 2015-11-03 NOTE — Telephone Encounter (Signed)
Yes thanks, 5 refills ok

## 2015-11-04 ENCOUNTER — Telehealth: Payer: Self-pay | Admitting: *Deleted

## 2015-11-04 NOTE — Telephone Encounter (Signed)
Received prior authorization request for Restoril 30mg  from [facility].  Submitted PA via phone.  PA approved from 05/09/15 through 05/09/16.

## 2015-11-05 DIAGNOSIS — D518 Other vitamin B12 deficiency anemias: Secondary | ICD-10-CM | POA: Diagnosis not present

## 2015-12-01 ENCOUNTER — Encounter: Payer: Self-pay | Admitting: Pulmonary Disease

## 2015-12-01 ENCOUNTER — Ambulatory Visit (INDEPENDENT_AMBULATORY_CARE_PROVIDER_SITE_OTHER): Payer: Medicare Other | Admitting: Pulmonary Disease

## 2015-12-01 VITALS — BP 132/80 | HR 85 | Ht 72.0 in | Wt 252.0 lb

## 2015-12-01 DIAGNOSIS — J40 Bronchitis, not specified as acute or chronic: Secondary | ICD-10-CM

## 2015-12-01 DIAGNOSIS — R05 Cough: Secondary | ICD-10-CM

## 2015-12-01 DIAGNOSIS — R0602 Shortness of breath: Secondary | ICD-10-CM

## 2015-12-01 DIAGNOSIS — I6523 Occlusion and stenosis of bilateral carotid arteries: Secondary | ICD-10-CM | POA: Diagnosis not present

## 2015-12-01 DIAGNOSIS — R059 Cough, unspecified: Secondary | ICD-10-CM

## 2015-12-01 NOTE — Assessment & Plan Note (Signed)
She has had recurrent episodes of bronchitis for most of her life at this is been more severe here recently. Objectively, her lungs are clear on exam but her CT imaging revealed some bronchiectasis in the left lower lobe. Overall, her symptoms would be consistent with a diagnosis of bronchiectasis. We have yet to diagnose this as she has not had a high-resolution CT scan of the chest. The differential diagnosis would include asthma or an atypical infection but I think bronchiectasis is most likely.  Plan: High-resolution CT scan of the chest Pulmonary function test Follow-up in 2-3 weeks to go over those results  If she is seen here by one of our nurse practitioners and the a CT scan confirms a diagnosis of bronchiectasis I recommend the following: Sputum culture for bacteria, fungal, AFB Educational mucociliary clearance with a flutter valve Trial of a long-acting bronchodilator and inhaled corticosteroid As needed albuterol Serum IgM, IgG, IgA levels, alpha-1 antitrypsin testing, and consideration of cystic fibrosis testing.

## 2015-12-01 NOTE — Patient Instructions (Signed)
We will call you with the results of the CT scan the lung function test We will see you back in 2-3 weeks ago over these results

## 2015-12-01 NOTE — Progress Notes (Signed)
Subjective:    Patient ID: [name], female    DOB: 1941/02/12, 75 y.o.   MRN: [id number]  HPI Chief Complaint  Patient presents with  . Advice Only    Referred by Dr. Yong Channel for Wheezing.  Pt has had 4 rounds of bronchitis since Jan. 2017.   Joycelyn SchmidMargie") was sent to me by Dr. Yong Channel for further evaluation of recurrent bronchitis.  She notes that she has had problems with recurrent bronchitis ever since childhood but she was never told why this was the case.  She grew up in [place] and she lived in [place], [place].    She notes that she had been doing fairly well for a number of years without too much difficulty.  She says that in the last few months things had been worse since her grandson has been living with them.  He is six and is frequently ill.  She has never been told that she had asthma. She never smoked, but she has been around second hand smoke. 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  Her father had COPD after smoking his entire life.  Her mother had rheumatoid arthritis.  She has diabetes and hypertension.  When she has the bronchitis she wheezes and produces large amounts of mucus.  She has tried various medicines including mucinex but they don't help much.  Even in between episodes of bronchitis she may wheeze.  She "always" needs an antibiotic to get over her bronchitis.      Past Medical History:  Diagnosis Date  . Arthritis    right knee;injections every 48months   . Asthma   . Cancer (HCC)    HX BREAST CANCER  . COMPRESSION FRACTURE, LUMBAR VERTEBRAE 08/21/2008   Qualifier: Diagnosis of  By: Arnoldo Morale MD, Balinda Quails   . Depression   . Diabetes mellitus    takes Amaryl and Januvia daily  . Difficulty sleeping   . Diverticulitis   . Early cataracts, bilateral   . Eczema   . Endometriosis   . Gastritis   . Glaucoma   . Hammer toe   . Headache(784.0)    Migraines  . Hypertension      takes Atenolol daily  . IBS (irritable bowel syndrome)   . Joint pain   . Kidney stone   . Neuropathy ([place])   . Open wound of second toe of left foot   . Osteomyelitis ([place])    left 2nd toe  . Osteopenia   . PONV (postoperative nausea and vomiting)   . Posterior tibial tendon dysfunction    left foot  . Scoliosis   . 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 Vitamin D deficiency    takes Vit d daily     Family History  Problem Relation Age of Onset  . Arthritis Mother   . COPD Father   . Heart disease Father     MI 54  . Hypertension Father   . Hyperlipidemia Father   . Colon cancer Neg Hx   . Esophageal cancer Neg Hx   . Rectal cancer Neg Hx   . Stomach cancer Neg Hx      Social History   Social History  . Marital status: Married    Spouse name: N/A  . Number of children: N/A  . Years of education: N/A   Occupational  History  . Not on file.   Social History Main Topics  . Smoking status: Never Smoker  . Smokeless tobacco: Never Used  . Alcohol use No  . Drug use: No  . Sexual activity: Not on file   Other Topics Concern  . Not on file   Social History Narrative   Married 39 years in 2015. 1 adopted son. 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 Cephalexin Diarrhea    Patient can't remember reaction (per chart at [facility] states diarrhea)  . Erythromycin Ethylsuccinate Hives and Diarrhea  . Levaquin [Levofloxacin In D5w] Other (See Comments)    Pain in tendons   . Nitrofurantoin Other (See Comments)    Severe headache  . Percocet [Oxycodone-Acetaminophen] Itching     Outpatient Medications Prior to Visit  Medication Sig Dispense Refill  . albuterol (PROVENTIL HFA;VENTOLIN HFA) 108 (90 Base) MCG/ACT inhaler Inhale 2 puffs into the lungs every 6 (six) hours as needed for wheezing or shortness of breath. 1  Inhaler 2  . atenolol (TENORMIN) 25 MG tablet TAKE ONE TABLET BY MOUTH ONCE DAILY 90 tablet 3  . betamethasone dipropionate (DIPROLENE) 0.05 % cream Apply 1 application topically 2 (two) times daily as needed (dermatitis).     . cyanocobalamin (,VITAMIN B-12,) 1000 MCG/ML injection Inject 1 mL (1,000 mcg total) into the muscle every 30 (thirty) days. 1 mL 11  . Difluprednate (DUREZOL) 0.05 % EMUL Place 1 drop into the right eye every morning.    . dorzolamide-timolol (COSOPT) 22.3-6.8 MG/ML ophthalmic solution Place 1 drop into both eyes 2 (two) times daily.     [name] ESTRING 2 MG vaginal ring INSERT 1 RING VAGINALLY EVERY 4 MONTHS 1 each 3  . Flaxseed, Linseed, (FLAX SEED OIL PO) Take 1 tablet by mouth daily.    [name] FLUoxetine (PROZAC) 40 MG capsule Take 1 capsule (40 mg total) by mouth daily. 90 capsule 3  . glimepiride (AMARYL) 4 MG tablet TAKE ONE TABLET BY MOUTH DAILY WITH BREAKFAST. 90 tablet 3  . glucose blood (FREESTYLE LITE) test strip Use as instructed to check blood sugar once a day 100 each 1  . Isometheptene-Caffeine-APAP 65-20-325 MG TABS Take 1-2 tablets as needed for migraine twice a day maximum 30 tablet 5  . lisinopril (PRINIVIL,ZESTRIL) 10 MG tablet TAKE ONE TABLET BY MOUTH DAILY 90 tablet 3  . Loteprednol Etabonate (LOTEMAX) 0.5 % GEL Place 1 drop into the right eye 2 (two) times daily.    [name] LUMIGAN 0.01 % SOLN Place 1 drop into both eyes at bedtime.     . meloxicam (MOBIC) 15 MG tablet TAKE ONE TABLET BY MOUTH ONCE DAILY 90 tablet 2  . Methylcellulose, Laxative, (CITRUCEL) 500 MG TABS Take 2 tablets by mouth daily after supper.     . nystatin cream (MYCOSTATIN) Apply 1 application topically 2 (two) times daily. Apply to affected area BID for up to 7 days.  Use externally. 30 g 1  . Omega-3 Fatty Acids (FISH OIL PO) Take 2 tablets by mouth daily.    . pramipexole (MIRAPEX) 0.5 MG tablet TAKE ONE TABLET BY MOUTH AT BEDTIME 90 tablet 0  . saccharomyces boulardii (FLORASTOR) 250 MG  capsule Take 1 capsule (250 mg total) by mouth 2 (two) times daily. 60 capsule 0  . temazepam (RESTORIL) 30 MG capsule TAKE  ONE CAPSULE BY MOUTH AT BEDTIME AS NEEDED FOR SLEEP 30 capsule 5  . terconazole (TERAZOL 7) 0.4 % vaginal cream Place 1 applicator vaginally at bedtime. 45 g 1  . VICTOZA 18 MG/3ML SOPN INJECT 1.8MG  INTO THE SKIN DAILY. 9 pen 6  . fluconazole (DIFLUCAN) 150 MG tablet 1 tab po x 1, repeat in 3 days and then again in 3 days (Patient not taking: Reported on 12/01/2015) 3 tablet 1   No facility-administered medications prior to visit.       Review of Systems  Constitutional: Negative for fever and unexpected weight change.  HENT: Negative for congestion, dental problem, ear pain, nosebleeds, postnasal drip, rhinorrhea, sinus pressure, sneezing, sore throat and trouble swallowing.   Eyes: Negative for redness and itching.  Respiratory: Positive for cough, shortness of breath and wheezing. Negative for chest tightness.   Cardiovascular: Negative for palpitations and leg swelling.  Gastrointestinal: Negative for nausea and vomiting.  Genitourinary: Negative for dysuria.  Musculoskeletal: Negative for joint swelling.  Skin: Negative for rash.  Neurological: Negative for headaches.  Hematological: Does not bruise/bleed easily.  Psychiatric/Behavioral: Negative for dysphoric mood. The patient is not nervous/anxious.        Objective:   Physical Exam Vitals:   12/01/15 1606  BP: 132/80  Pulse: 85  SpO2: 96%  Weight: 252 lb (114.3 kg)  Height: 6' (1.829 m)   RA  Gen: well appearing, no acute distress HENT: NCAT, OP clear, neck supple without masses Eyes: PERRL, EOMi Lymph: no cervical lymphadenopathy PULM: CTA B CV: RRR, no mgr, no JVD GI: BS+, soft, nontender, no hsm Derm: no rash or skin breakdown MSK: normal bulk and tone Neuro: A&Ox4, CN II-XII intact, strength 5/5 in all 4 extremities Psyche: normal mood and affect  June 2017 records from Dr. Rushie Chestnut reviewed where she was referred to our office. He notes that she has had multiple rounds of bronchitis this year and on a CT scan of her abdomen she was noted to have bronchiectasis. She was also hospitalized for diverticulitis this year.    Assessment & Plan:  Bronchitis She has had recurrent episodes of bronchitis for most of her life at this is been more severe here recently. Objectively, her lungs are clear on exam but her CT imaging revealed some bronchiectasis in the left lower lobe. Overall, her symptoms would be consistent with a diagnosis of bronchiectasis. We have yet to diagnose this as she has not had a high-resolution CT scan of the chest. The differential diagnosis would include asthma or an atypical infection but I think bronchiectasis is most likely.  Plan: High-resolution CT scan of the chest Pulmonary function test Follow-up in 2-3 weeks to go over those results  If she is seen here by one of our nurse practitioners and the a CT scan confirms a diagnosis of bronchiectasis I recommend the following: Sputum culture for bacteria, fungal, AFB Educational mucociliary clearance with a flutter valve Trial of a long-acting bronchodilator and inhaled corticosteroid As needed albuterol Serum IgM, IgG, IgA levels, alpha-1 antitrypsin testing, and consideration of cystic fibrosis testing.    Current Outpatient Prescriptions:  .  albuterol (PROVENTIL HFA;VENTOLIN HFA) 108 (90 Base) MCG/ACT inhaler, Inhale 2 puffs into the lungs every 6 (six) hours as needed for wheezing or shortness of breath., Disp: 1 Inhaler, Rfl: 2 .  atenolol (TENORMIN) 25 MG tablet, TAKE ONE TABLET BY MOUTH ONCE DAILY, Disp: 90 tablet, Rfl: 3 .  betamethasone dipropionate (DIPROLENE) 0.05 % cream,  Apply 1 application topically 2 (two) times daily as needed (dermatitis). , Disp: , Rfl:  .  cyanocobalamin (,VITAMIN B-12,) 1000 MCG/ML injection, Inject 1 mL (1,000 mcg total) into the muscle every 30 (thirty)  days., Disp: 1 mL, Rfl: 11 .  Difluprednate (DUREZOL) 0.05 % EMUL, Place 1 drop into the right eye every morning., Disp: , Rfl:  .  dorzolamide-timolol (COSOPT) 22.3-6.8 MG/ML ophthalmic solution, Place 1 drop into both eyes 2 (two) times daily. , Disp: , Rfl:  .  ESTRING 2 MG vaginal ring, INSERT 1 RING VAGINALLY EVERY 4 MONTHS, Disp: 1 each, Rfl: 3 .  Flaxseed, Linseed, (FLAX SEED OIL PO), Take 1 tablet by mouth daily., Disp: , Rfl:  .  FLUoxetine (PROZAC) 40 MG capsule, Take 1 capsule (40 mg total) by mouth daily., Disp: 90 capsule, Rfl: 3 .  glimepiride (AMARYL) 4 MG tablet, TAKE ONE TABLET BY MOUTH DAILY WITH BREAKFAST., Disp: 90 tablet, Rfl: 3 .  glucose blood (FREESTYLE LITE) test strip, Use as instructed to check blood sugar once a day, Disp: 100 each, Rfl: 1 .  Isometheptene-Caffeine-APAP 65-20-325 MG TABS, Take 1-2 tablets as needed for migraine twice a day maximum, Disp: 30 tablet, Rfl: 5 .  lisinopril (PRINIVIL,ZESTRIL) 10 MG tablet, TAKE ONE TABLET BY MOUTH DAILY, Disp: 90 tablet, Rfl: 3 .  Loteprednol Etabonate (LOTEMAX) 0.5 % GEL, Place 1 drop into the right eye 2 (two) times daily., Disp: , Rfl:  .  LUMIGAN 0.01 % SOLN, Place 1 drop into both eyes at bedtime. , Disp: , Rfl:  .  meloxicam (MOBIC) 15 MG tablet, TAKE ONE TABLET BY MOUTH ONCE DAILY, Disp: 90 tablet, Rfl: 2 .  Methylcellulose, Laxative, (CITRUCEL) 500 MG TABS, Take 2 tablets by mouth daily after supper. , Disp: , Rfl:  .  nystatin cream (MYCOSTATIN), Apply 1 application topically 2 (two) times daily. Apply to affected area BID for up to 7 days.  Use externally., Disp: 30 g, Rfl: 1 .  Omega-3 Fatty Acids (FISH OIL PO), Take 2 tablets by mouth daily., Disp: , Rfl:  .  pramipexole (MIRAPEX) 0.5 MG tablet, TAKE ONE TABLET BY MOUTH AT BEDTIME, Disp: 90 tablet, Rfl: 0 .  saccharomyces boulardii (FLORASTOR) 250 MG capsule, Take 1 capsule (250 mg total) by mouth 2 (two) times daily., Disp: 60 capsule, Rfl: 0 .  temazepam  (RESTORIL) 30 MG capsule, TAKE ONE CAPSULE BY MOUTH AT BEDTIME AS NEEDED FOR SLEEP, Disp: 30 capsule, Rfl: 5 .  terconazole (TERAZOL 7) 0.4 % vaginal cream, Place 1 applicator vaginally at bedtime., Disp: 45 g, Rfl: 1 .  VICTOZA 18 MG/3ML SOPN, INJECT 1.8MG  INTO THE SKIN DAILY., Disp: 9 pen, Rfl: 6

## 2015-12-02 ENCOUNTER — Encounter (INDEPENDENT_AMBULATORY_CARE_PROVIDER_SITE_OTHER): Payer: Medicare Other | Admitting: Pulmonary Disease

## 2015-12-02 DIAGNOSIS — R0602 Shortness of breath: Secondary | ICD-10-CM

## 2015-12-02 DIAGNOSIS — R059 Cough, unspecified: Secondary | ICD-10-CM

## 2015-12-02 DIAGNOSIS — R05 Cough: Secondary | ICD-10-CM

## 2015-12-02 LAB — PULMONARY FUNCTION TEST
DL/VA % PRED: 94 %
DL/VA: 5.28 ml/min/mmHg/L
DLCO cor % pred: 75 %
DLCO cor: 25.96 ml/min/mmHg
DLCO unc % pred: 74 %
DLCO unc: 25.72 ml/min/mmHg
FEF 25-75 Post: 1.84 L/sec
FEF 25-75 Pre: 1.75 L/sec
FEF2575-%CHANGE-POST: 5 %
FEF2575-%Pred-Post: 89 %
FEF2575-%Pred-Pre: 84 %
FEV1-%Change-Post: 3 %
FEV1-%PRED-PRE: 68 %
FEV1-%Pred-Post: 71 %
FEV1-PRE: 1.94 L
FEV1-Post: 2.02 L
FEV1FVC-%CHANGE-POST: 0 %
FEV1FVC-%Pred-Pre: 102 %
FEV6-%CHANGE-POST: 5 %
FEV6-%PRED-PRE: 68 %
FEV6-%Pred-Post: 72 %
FEV6-POST: 2.6 L
FEV6-PRE: 2.47 L
FEV6FVC-%Change-Post: 0 %
FEV6FVC-%PRED-PRE: 105 %
FEV6FVC-%Pred-Post: 104 %
FVC-%CHANGE-POST: 4 %
FVC-%PRED-POST: 70 %
FVC-%PRED-PRE: 67 %
FVC-POST: 2.63 L
FVC-Pre: 2.52 L
POST FEV6/FVC RATIO: 99 %
Post FEV1/FVC ratio: 77 %
Pre FEV1/FVC ratio: 77 %
Pre FEV6/FVC Ratio: 100 %
RV % PRED: 91 %
RV: 2.45 L
TLC % pred: 81 %
TLC: 5.02 L

## 2015-12-04 ENCOUNTER — Ambulatory Visit (HOSPITAL_COMMUNITY)
Admission: RE | Admit: 2015-12-04 | Discharge: 2015-12-04 | Disposition: A | Discharge status: Home / Self Care | Payer: Medicare Other | Source: Ambulatory Visit | Attending: Pulmonary Disease | Admitting: Pulmonary Disease

## 2015-12-04 DIAGNOSIS — R05 Cough: Secondary | ICD-10-CM | POA: Insufficient documentation

## 2015-12-04 DIAGNOSIS — I2510 Atherosclerotic heart disease of native coronary artery without angina pectoris: Secondary | ICD-10-CM | POA: Insufficient documentation

## 2015-12-04 DIAGNOSIS — R0602 Shortness of breath: Secondary | ICD-10-CM | POA: Insufficient documentation

## 2015-12-04 DIAGNOSIS — R918 Other nonspecific abnormal finding of lung field: Secondary | ICD-10-CM | POA: Diagnosis not present

## 2015-12-04 DIAGNOSIS — J479 Bronchiectasis, uncomplicated: Secondary | ICD-10-CM | POA: Diagnosis not present

## 2015-12-04 DIAGNOSIS — I700 Atherosclerosis of aorta: Secondary | ICD-10-CM | POA: Diagnosis not present

## 2015-12-04 DIAGNOSIS — R059 Cough, unspecified: Secondary | ICD-10-CM

## 2015-12-04 DIAGNOSIS — J398 Other specified diseases of upper respiratory tract: Secondary | ICD-10-CM | POA: Diagnosis not present

## 2015-12-05 ENCOUNTER — Encounter: Payer: Self-pay | Admitting: Pulmonary Disease

## 2015-12-05 ENCOUNTER — Other Ambulatory Visit: Payer: Self-pay | Admitting: Family Medicine

## 2015-12-05 DIAGNOSIS — J398 Other specified diseases of upper respiratory tract: Secondary | ICD-10-CM | POA: Insufficient documentation

## 2015-12-08 DIAGNOSIS — H25812 Combined forms of age-related cataract, left eye: Secondary | ICD-10-CM | POA: Diagnosis not present

## 2015-12-08 DIAGNOSIS — H2512 Age-related nuclear cataract, left eye: Secondary | ICD-10-CM | POA: Diagnosis not present

## 2015-12-09 DIAGNOSIS — D518 Other vitamin B12 deficiency anemias: Secondary | ICD-10-CM | POA: Diagnosis not present

## 2015-12-11 ENCOUNTER — Encounter: Payer: Self-pay | Admitting: Family Medicine

## 2015-12-11 ENCOUNTER — Other Ambulatory Visit: Payer: Self-pay

## 2015-12-11 DIAGNOSIS — J479 Bronchiectasis, uncomplicated: Secondary | ICD-10-CM

## 2015-12-15 ENCOUNTER — Other Ambulatory Visit: Payer: Medicare Other

## 2015-12-15 DIAGNOSIS — J479 Bronchiectasis, uncomplicated: Secondary | ICD-10-CM | POA: Diagnosis not present

## 2015-12-16 LAB — IGG, IGA, IGM
IGA: 270 mg/dL (ref 81–463)
IgG (Immunoglobin G), Serum: 943 mg/dL (ref 694–1618)
IgM, Serum: 67 mg/dL (ref 48–271)

## 2015-12-18 ENCOUNTER — Telehealth: Payer: Self-pay | Admitting: Pulmonary Disease

## 2015-12-18 NOTE — Telephone Encounter (Signed)
Per lab result note: A,  Please let the patient know this was OK  Thanks,  B  -----  LMTCB x1

## 2015-12-19 NOTE — Telephone Encounter (Signed)
Called and spoke with pt and she is aware of lab results per BQ. Nothing further is needed.

## 2015-12-20 LAB — ALPHA-1 ANTITRYPSIN PHENOTYPE: A-1 Antitrypsin: 99 mg/dL (ref 83–199)

## 2015-12-25 ENCOUNTER — Other Ambulatory Visit: Payer: Self-pay | Admitting: Family Medicine

## 2015-12-29 ENCOUNTER — Telehealth: Payer: Self-pay | Admitting: Family Medicine

## 2015-12-29 NOTE — Telephone Encounter (Signed)
Pt request refill  FREESTYLE LITE test strip  We sent last week, but they needs DX code and rx cannot have more than 5 refills. Can you resend?  [facility]/ [address]

## 2015-12-30 ENCOUNTER — Other Ambulatory Visit: Payer: Self-pay

## 2015-12-30 DIAGNOSIS — E119 Type 2 diabetes mellitus without complications: Secondary | ICD-10-CM

## 2015-12-30 DIAGNOSIS — Z794 Long term (current) use of insulin: Principal | ICD-10-CM

## 2015-12-30 MED ORDER — GLUCOSE BLOOD VI STRP: ORAL_STRIP | 5 refills | Status: DC

## 2015-12-30 NOTE — Telephone Encounter (Signed)
Prescription sent to pharmacy as requested.

## 2015-12-31 ENCOUNTER — Other Ambulatory Visit: Payer: Self-pay | Admitting: *Deleted

## 2016-01-03 ENCOUNTER — Ambulatory Visit (HOSPITAL_COMMUNITY)
Admission: EM | Admit: 2016-01-03 | Discharge: 2016-01-03 | Disposition: A | Discharge status: Home / Self Care | Payer: Medicare Other | Attending: Radiology | Admitting: Radiology

## 2016-01-03 ENCOUNTER — Encounter (HOSPITAL_COMMUNITY): Payer: Self-pay | Admitting: Emergency Medicine

## 2016-01-03 DIAGNOSIS — L03818 Cellulitis of other sites: Secondary | ICD-10-CM

## 2016-01-03 MED ORDER — SULFAMETHOXAZOLE-TRIMETHOPRIM 800-160 MG PO TABS: 1.0000 | ORAL_TABLET | Freq: Two times a day (BID) | ORAL | 0 refills | Status: DC

## 2016-01-03 MED ORDER — BETAMETHASONE DIPROPIONATE 0.05 % EX CREA: 1.0000 "application " | TOPICAL_CREAM | Freq: Two times a day (BID) | CUTANEOUS | 1 refills | Status: DC | PRN

## 2016-01-03 NOTE — ED Notes (Signed)
Pt d/c by Anderson Malta, NP

## 2016-01-03 NOTE — ED Provider Notes (Signed)
CSN: [id number]     Arrival date & time 01/03/16  1603 History   First MD Initiated Contact with Patient 01/03/16 1833     Chief Complaint  Patient presents with  . Insect Bite   (Consider location/radiation/quality/duration/timing/severity/associated sxs/prior Treatment) 75 y.o. Female  presents with erythem to lateral aspect of right arm after what is thought to be a bug bite 2 days prior. Condition is acute in nature. Condition is made better by nothing. Condition is made worse by nothing. Patient denies any treatment prior to there arrival at this facility. Patient denies any fevers.       Past Medical History:  Diagnosis Date  . Arthritis    right knee;injections every 79months   . Asthma   . Cancer (HCC)    HX BREAST CANCER  . COMPRESSION FRACTURE, LUMBAR VERTEBRAE 08/21/2008   Qualifier: Diagnosis of  By: Arnoldo Morale MD, Balinda Quails   . Depression   . Diabetes mellitus    takes Amaryl and Januvia daily  . Difficulty sleeping   . Diverticulitis   . Early cataracts, bilateral   . Eczema   . Endometriosis   . Gastritis   . Glaucoma   . Hammer toe   . Headache(784.0)    Migraines  . Hypertension    takes Atenolol daily  . IBS (irritable bowel syndrome)   . Joint pain   . Kidney stone   . Neuropathy ([place])   . Open wound of second toe of left foot   . Osteomyelitis ([place])    left 2nd toe  . Osteopenia   . PONV (postoperative nausea and vomiting)   . Posterior tibial tendon dysfunction    left foot  . Scoliosis   . 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 Vitamin D deficiency    takes Vit d daily   Past Surgical History:  Procedure Laterality Date  . ADENOIDECTOMY     at age 52  . biopsy on back     benign  . BREAST SURGERY    . CATARACT EXTRACTION    . CHOLECYSTECTOMY  06/2008  . COLONOSCOPY  11/02/05  . DIAGNOSTIC LAPAROSCOPY    . DILATION AND CURETTAGE OF UTERUS    . ENDOMETRIAL BIOPSY  06/22/2011   benign polyp, no  hyperplasia  . excision on breast     internal infected suture from breast surgery  . excision removed from neck     infected lymph node  . FOOT SURGERY     left foot-cleaning out areas on toes at the wound center-having MRI to determine if osteomyelitis  . HYSTEROSCOPY  06/22/11   PMB submucosal myoma  . JOINT REPLACEMENT     left total shoulder  . LAPAROSCOPIC OOPHORECTOMY Right 12/2004   absent LSO  . LAPAROTOMY    . MASTECTOMY Bilateral 1994 right, 1995 left  . MASTECTOMY     Bilateral  . SHOULDER HEMI-ARTHROPLASTY  01/01/2012   Procedure: SHOULDER HEMI-ARTHROPLASTY;  Surgeon: Roseanne Kaufman, MD;  Location: [facility];  Service: Orthopedics;  Laterality: Left;  Left Shoulder Hemi Arthroplasty with Repair and Reconstruction as Necessary   . THYROIDECTOMY     21yrs ago. follows endocrine  . TONSILLECTOMY    . TOTAL KNEE ARTHROPLASTY Right 12/30/2014   Procedure: RIGHT TOTAL KNEE ARTHROPLASTY;  Surgeon: Gaynelle Arabian, MD;  Location: [facility];  Service: Orthopedics;  Laterality: Right;   Family History  Problem Relation Age of Onset  . Arthritis  Mother   . COPD Father   . Heart disease Father     MI 37  . Hypertension Father   . Hyperlipidemia Father   . Colon cancer Neg Hx   . Esophageal cancer Neg Hx   . Rectal cancer Neg Hx   . Stomach cancer Neg Hx    Social History  Substance Use Topics  . Smoking status: Never Smoker  . Smokeless tobacco: Never Used  . Alcohol use No   OB History    Gravida Para Term Preterm AB Living   0             SAB TAB Ectopic Multiple Live Births                  Obstetric Comments   1 adopted     Review of Systems  Constitutional: Negative.   Skin: Negative for color change.       Redness to lateral portion of right upper arm.    Allergies  Cephalexin; Erythromycin ethylsuccinate; Levaquin [levofloxacin in d5w]; Nitrofurantoin; and Percocet [oxycodone-acetaminophen]  Home Medications   Prior to Admission medications   Medication  Sig Start Date End Date Taking? Authorizing Provider  atenolol (TENORMIN) 25 MG tablet TAKE ONE TABLET BY MOUTH ONCE DAILY 12/08/15  Yes Marin Olp, MD  cyanocobalamin (,VITAMIN B-12,) 1000 MCG/ML injection Inject 1 mL (1,000 mcg total) into the muscle every 30 (thirty) days. 05/21/15 05/05/16 Yes Marin Olp, MD  Difluprednate (DUREZOL) 0.05 % EMUL Place 1 drop into the right eye every morning.   Yes Historical Provider, MD  dorzolamide-timolol (COSOPT) 22.3-6.8 MG/ML ophthalmic solution Place 1 drop into both eyes 2 (two) times daily.  02/08/13  Yes Historical Provider, MD  ESTRING 2 MG vaginal ring INSERT 1 RING VAGINALLY EVERY 4 MONTHS 05/14/15  Yes Megan Salon, MD  Flaxseed, Linseed, (FLAX SEED OIL PO) Take 1 tablet by mouth daily.   Yes Historical Provider, MD  FLUoxetine (PROZAC) 40 MG capsule Take 1 capsule (40 mg total) by mouth daily. 10/17/15  Yes Marin Olp, MD  glimepiride (AMARYL) 4 MG tablet TAKE ONE TABLET BY MOUTH DAILY WITH BREAKFAST. 01/20/15  Yes Marin Olp, MD  glucose blood (FREESTYLE LITE) test strip USE TO CHECK BLOOD SUGAR DAILY AND AS NEEDED 12/30/15  Yes Marin Olp, MD  lisinopril (PRINIVIL,ZESTRIL) 10 MG tablet TAKE ONE TABLET BY MOUTH DAILY 01/20/15  Yes Marin Olp, MD  Loteprednol Etabonate (LOTEMAX) 0.5 % GEL Place 1 drop into the right eye 2 (two) times daily.   Yes Historical Provider, MD  LUMIGAN 0.01 % SOLN Place 1 drop into both eyes at bedtime.  03/26/14  Yes Historical Provider, MD  meloxicam (MOBIC) 15 MG tablet TAKE ONE TABLET BY MOUTH ONCE DAILY 04/21/15  Yes Marin Olp, MD  Methylcellulose, Laxative, (CITRUCEL) 500 MG TABS Take 2 tablets by mouth daily after supper.    Yes Historical Provider, MD  Omega-3 Fatty Acids (FISH OIL PO) Take 2 tablets by mouth daily.   Yes Historical Provider, MD  pramipexole (MIRAPEX) 0.5 MG tablet TAKE ONE TABLET BY MOUTH AT BEDTIME 11/03/15  Yes Marin Olp, MD  temazepam (RESTORIL) 30 MG  capsule TAKE ONE CAPSULE BY MOUTH AT BEDTIME AS NEEDED FOR SLEEP 11/04/15  Yes Marin Olp, MD  VICTOZA 18 MG/3ML SOPN INJECT 1.8MG  INTO THE SKIN DAILY. 09/05/15  Yes Marin Olp, MD  albuterol (PROVENTIL HFA;VENTOLIN HFA) 108 (90 Base) MCG/ACT inhaler  Inhale 2 puffs into the lungs every 6 (six) hours as needed for wheezing or shortness of breath. 10/13/15   Marin Olp, MD  betamethasone dipropionate (DIPROLENE) 0.05 % cream Apply 1 application topically 2 (two) times daily as needed (dermatitis). 01/03/16   Jacqualine Mau, NP  Isometheptene-Caffeine-APAP 65-20-325 MG TABS Take 1-2 tablets as needed for migraine twice a day maximum 10/13/15   Marin Olp, MD  nystatin cream (MYCOSTATIN) Apply 1 application topically 2 (two) times daily. Apply to affected area BID for up to 7 days.  Use externally. 10/31/15   Megan Salon, MD  saccharomyces boulardii (FLORASTOR) 250 MG capsule Take 1 capsule (250 mg total) by mouth 2 (two) times daily. 10/08/15   Emina Riebock, NP  sulfamethoxazole-trimethoprim (BACTRIM DS,SEPTRA DS) 800-160 MG tablet Take 1 tablet by mouth 2 (two) times daily. 01/03/16 01/10/16  Jacqualine Mau, NP  terconazole (TERAZOL 7) 0.4 % vaginal cream Place 1 applicator vaginally at bedtime. 10/31/15   Megan Salon, MD   Meds Ordered and Administered this Visit  Medications - No data to display  BP 135/67 (BP Location: Left Wrist)   Pulse 89   Temp 98.2 F (36.8 C) (Oral)   Resp 12   LMP 05/10/1992   SpO2 100%  No data found.   Physical Exam  Constitutional: She appears well-developed and well-nourished.  Pulmonary/Chest: Effort normal and breath sounds normal.  Musculoskeletal: Normal range of motion.  Skin: Erythema: warmth and erythema noted to lateral aspect of right upper arm.     Urgent Care Course   Clinical Course    Procedures (including critical care time)  Labs Review Labs Reviewed - No data to display  Imaging Review No results  found.   Visual Acuity Review  Right Eye Distance:   Left Eye Distance:   Bilateral Distance:    Right Eye Near:   Left Eye Near:    Bilateral Near:         MDM   1. Cellulitis of other specified site       Jacqualine Mau, NP 01/03/16 1839

## 2016-01-03 NOTE — ED Triage Notes (Signed)
Here for insect bite right arm onset 2 days w/poss infections  Sx today include: swelling, redness, itching, localized fever  A&O x4... NAD

## 2016-01-03 NOTE — Discharge Instructions (Signed)
Continue to monitor for signs of improvement. Seek follow up if condition worsens

## 2016-01-08 ENCOUNTER — Ambulatory Visit (INDEPENDENT_AMBULATORY_CARE_PROVIDER_SITE_OTHER): Payer: Medicare Other | Admitting: Pulmonary Disease

## 2016-01-08 ENCOUNTER — Encounter: Payer: Self-pay | Admitting: Pulmonary Disease

## 2016-01-08 DIAGNOSIS — R911 Solitary pulmonary nodule: Secondary | ICD-10-CM

## 2016-01-08 DIAGNOSIS — L603 Nail dystrophy: Secondary | ICD-10-CM | POA: Diagnosis not present

## 2016-01-08 DIAGNOSIS — L84 Corns and callosities: Secondary | ICD-10-CM | POA: Diagnosis not present

## 2016-01-08 DIAGNOSIS — L97529 Non-pressure chronic ulcer of other part of left foot with unspecified severity: Secondary | ICD-10-CM | POA: Diagnosis not present

## 2016-01-08 DIAGNOSIS — J470 Bronchiectasis with acute lower respiratory infection: Secondary | ICD-10-CM | POA: Diagnosis not present

## 2016-01-08 DIAGNOSIS — I6523 Occlusion and stenosis of bilateral carotid arteries: Secondary | ICD-10-CM | POA: Diagnosis not present

## 2016-01-08 DIAGNOSIS — E1151 Type 2 diabetes mellitus with diabetic peripheral angiopathy without gangrene: Secondary | ICD-10-CM | POA: Diagnosis not present

## 2016-01-08 DIAGNOSIS — I739 Peripheral vascular disease, unspecified: Secondary | ICD-10-CM | POA: Diagnosis not present

## 2016-01-08 DIAGNOSIS — J398 Other specified diseases of upper respiratory tract: Secondary | ICD-10-CM | POA: Diagnosis not present

## 2016-01-08 DIAGNOSIS — M2042 Other hammer toe(s) (acquired), left foot: Secondary | ICD-10-CM | POA: Diagnosis not present

## 2016-01-08 MED ORDER — FLUTTER DEVI: 0 refills | Status: DC

## 2016-01-08 MED ORDER — BUDESONIDE-FORMOTEROL FUMARATE 160-4.5 MCG/ACT IN AERO: 2.0000 | INHALATION_SPRAY | Freq: Two times a day (BID) | RESPIRATORY_TRACT | 0 refills | Status: DC

## 2016-01-08 MED ORDER — AMOXICILLIN-POT CLAVULANATE 875-125 MG PO TABS: 1.0000 | ORAL_TABLET | Freq: Two times a day (BID) | ORAL | 0 refills | Status: DC

## 2016-01-08 NOTE — Assessment & Plan Note (Signed)
I believe that her bronchiectasis is post postinfectious from childhood illnesses but the problem is mechanically she severely limited by severe tracheobronchomalacia. See discussion below. So she's more susceptible to recurrent bacterial infections from her bronchiectasis within unfortunately its exceedingly difficult for her to clear mucus because of the tracheobronchomalacia seen on her CT chest. Fortunately there is no other evidence of an interstitial lung disease.  Moving forward she needs to use mucociliary clearance measures (start with flutter valve today) and will add Symbicort to see if this helps. Fortunately, she does not have significant mucus production when not ill but she does have recurrent respiratory infections which is due to her bronchiectasis.  Plan summary Start Symbicort trial, hopefully we'll see that this will improve symptoms of chest congestion and shortness of breath when she's having a cold As she is having a cold now I'm going to give her a prescription of Augmentin to take if she starts coughing up purulent mucus Use flutter valve 4-5 breaths, 4-5 times a day Follow-up 2 weeks to go over response to Symbicort with our nurse practitioner

## 2016-01-08 NOTE — Assessment & Plan Note (Signed)
4 mm right upper lobe pulmonary nodule is likely benign but giving her breast cancer history and age we need to repeat this in one year.

## 2016-01-08 NOTE — Assessment & Plan Note (Signed)
This is severe, noted on her July 2017 CT chest. At this time she has no signs or symptoms of obstructive sleep apnea so using CPAP to help stent open the airways is not indicated. That's really the only way we can treat this condition. The problem is that it makes it difficult for her to bring up mucus. There is no role for any sort of mechanical stenting.

## 2016-01-08 NOTE — Addendum Note (Signed)
Addended by: Len Blalock on: 01/08/2016 11:49 AM   Modules accepted: Orders

## 2016-01-08 NOTE — Progress Notes (Signed)
Subjective:    Patient ID: [name], female    DOB: October 24, 1940, 75 y.o.   MRN: [id number]  Synopsis: First evaluated by [facility] in 2017 in the setting of a chronic cough and recurrent respiratory infections. She had many childhood illnesses, but she doesn't recall one severe childhood illness.  She was found in July 2017 on high-resolution CT scanning of the chest to have bronchiectasis and severe tracheomalacia. Immunoglobulin testing an alpha-1 testing were normal. July 2017 pulmonary function testing ratio 77%, FEV1 2.02 L, 71% predicted, FVC 2.63 L 70% predicted, total lung capacity 5.02 L, 81% predicted, DLCO 25.7 274% predicted  HPI Chief Complaint  Patient presents with  . Follow-up    review labs, ct.  pt c/o sinus congestion, sneezing.      Cosima says that yesterday she started to have a scratchy throat, she is worried this will progress to her chest.  She has been coughing more lately, no mucus production.  No fever or chills.  No sinus congesiton, just feels run down, weak.  No sick contacts recently.   When she is not ill she does not have promised the shortness of breath or cough.  She is doing fine up until this recent problem.   Past Medical History:  Diagnosis Date  . Arthritis    right knee;injections every 23months   . Asthma   . Cancer (HCC)    HX BREAST CANCER  . COMPRESSION FRACTURE, LUMBAR VERTEBRAE 08/21/2008   Qualifier: Diagnosis of  By: Arnoldo Morale MD, Balinda Quails   . Depression   . Diabetes mellitus    takes Amaryl and Januvia daily  . Difficulty sleeping   . Diverticulitis   . Early cataracts, bilateral   . Eczema   . Endometriosis   . Gastritis   . Glaucoma   . Hammer toe   . Headache(784.0)    Migraines  . Hypertension    takes Atenolol daily  . IBS (irritable bowel syndrome)   . Joint pain   . Kidney stone   . Neuropathy ([place])   . Open wound of second toe of left foot   . Osteomyelitis ([place])    left 2nd toe  . Osteopenia     . PONV (postoperative nausea and vomiting)   . Posterior tibial tendon dysfunction    left foot  . Scoliosis   . 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 Vitamin D deficiency    takes Vit d daily      Review of Systems  Constitutional: Negative for chills, fatigue and fever.  HENT: Positive for sore throat. Negative for postnasal drip, rhinorrhea and sinus pressure.   Respiratory: Positive for cough and shortness of breath. Negative for wheezing.   Cardiovascular: Negative for chest pain and leg swelling.       Objective:   Physical Exam Vitals:   01/08/16 1113  BP: 118/74  Pulse: 82  SpO2: 95%  Weight: 247 lb 9.6 oz (112.3 kg)  Height: 6' (1.829 m)  RA  Gen: well appearing HENT: OP clear, TM's clear, neck supple PULM: CTA B, normal percussion CV: RRR, no mgr, trace edema GI: BS+, soft, nontender Derm: no cyanosis or rash Psyche: normal mood and affect  CBC    Component Value Date/Time   WBC 5.9 10/08/2015 0519   RBC 3.60 (L) 10/08/2015 0519   HGB 10.2 (L) 10/08/2015 0519   HCT 32.4 (L) 10/08/2015 [id number]  PLT 247 10/08/2015 0519   MCV 90.0 10/08/2015 0519   MCH 28.3 10/08/2015 0519   MCHC 31.5 10/08/2015 0519   RDW 14.1 10/08/2015 0519   LYMPHSABS 1.3 10/04/2015 2158   MONOABS 0.7 10/04/2015 2158   EOSABS 0.0 10/04/2015 2158   BASOSABS 0.0 10/04/2015 2158     August 2017 high-resolution CT chest images personally reviewed showing 4.4 mm nodule in the right upper lobe mild bronchiectasis worse in the lower lobes bilaterally no evidence of an underlying connective tissue disease     Assessment & Plan:  Bronchiectasis without acute exacerbation ([place]) I believe that her bronchiectasis is post postinfectious from childhood illnesses but the problem is mechanically she severely limited by severe tracheobronchomalacia. See discussion below. So she's more susceptible to recurrent bacterial infections from her bronchiectasis  within unfortunately its exceedingly difficult for her to clear mucus because of the tracheobronchomalacia seen on her CT chest. Fortunately there is no other evidence of an interstitial lung disease.  Moving forward she needs to use mucociliary clearance measures (start with flutter valve today) and will add Symbicort to see if this helps. Fortunately, she does not have significant mucus production when not ill but she does have recurrent respiratory infections which is due to her bronchiectasis.  Plan summary Start Symbicort trial, hopefully we'll see that this will improve symptoms of chest congestion and shortness of breath when she's having a cold As she is having a cold now I'm going to give her a prescription of Augmentin to take if she starts coughing up purulent mucus Use flutter valve 4-5 breaths, 4-5 times a day Follow-up 2 weeks to go over response to Symbicort with our nurse practitioner  Tracheomalacia This is severe, noted on her July 2017 CT chest. At this time she has no signs or symptoms of obstructive sleep apnea so using CPAP to help stent open the airways is not indicated. That's really the only way we can treat this condition. The problem is that it makes it difficult for her to bring up mucus. There is no role for any sort of mechanical stenting.  Solitary pulmonary nodule 4 mm right upper lobe pulmonary nodule is likely benign but giving her breast cancer history and age we need to repeat this in one year.    Current Outpatient Prescriptions:  .  albuterol (PROVENTIL HFA;VENTOLIN HFA) 108 (90 Base) MCG/ACT inhaler, Inhale 2 puffs into the lungs every 6 (six) hours as needed for wheezing or shortness of breath., Disp: 1 Inhaler, Rfl: 2 .  atenolol (TENORMIN) 25 MG tablet, TAKE ONE TABLET BY MOUTH ONCE DAILY, Disp: 90 tablet, Rfl: 1 .  betamethasone dipropionate (DIPROLENE) 0.05 % cream, Apply 1 application topically 2 (two) times daily as needed (dermatitis)., Disp: 30 g,  Rfl: 1 .  cyanocobalamin (,VITAMIN B-12,) 1000 MCG/ML injection, Inject 1 mL (1,000 mcg total) into the muscle every 30 (thirty) days., Disp: 1 mL, Rfl: 11 .  Difluprednate (DUREZOL) 0.05 % EMUL, Place 1 drop into the right eye every morning., Disp: , Rfl:  .  dorzolamide-timolol (COSOPT) 22.3-6.8 MG/ML ophthalmic solution, Place 1 drop into both eyes 2 (two) times daily. , Disp: , Rfl:  .  ESTRING 2 MG vaginal ring, INSERT 1 RING VAGINALLY EVERY 4 MONTHS, Disp: 1 each, Rfl: 3 .  Flaxseed, Linseed, (FLAX SEED OIL PO), Take 1 tablet by mouth daily., Disp: , Rfl:  .  FLUoxetine (PROZAC) 40 MG capsule, Take 1 capsule (40 mg total) by mouth daily., Disp: 90  capsule, Rfl: 3 .  glimepiride (AMARYL) 4 MG tablet, TAKE ONE TABLET BY MOUTH DAILY WITH BREAKFAST., Disp: 90 tablet, Rfl: 3 .  glucose blood (FREESTYLE LITE) test strip, USE TO CHECK BLOOD SUGAR DAILY AND AS NEEDED, Disp: 100 each, Rfl: 5 .  Isometheptene-Caffeine-APAP 65-20-325 MG TABS, Take 1-2 tablets as needed for migraine twice a day maximum, Disp: 30 tablet, Rfl: 5 .  lisinopril (PRINIVIL,ZESTRIL) 10 MG tablet, TAKE ONE TABLET BY MOUTH DAILY, Disp: 90 tablet, Rfl: 3 .  Loteprednol Etabonate (LOTEMAX) 0.5 % GEL, Place 1 drop into the right eye 2 (two) times daily., Disp: , Rfl:  .  LUMIGAN 0.01 % SOLN, Place 1 drop into both eyes at bedtime. , Disp: , Rfl:  .  meloxicam (MOBIC) 15 MG tablet, TAKE ONE TABLET BY MOUTH ONCE DAILY, Disp: 90 tablet, Rfl: 2 .  Methylcellulose, Laxative, (CITRUCEL) 500 MG TABS, Take 2 tablets by mouth daily after supper. , Disp: , Rfl:  .  nystatin cream (MYCOSTATIN), Apply 1 application topically 2 (two) times daily. Apply to affected area BID for up to 7 days.  Use externally., Disp: 30 g, Rfl: 1 .  Omega-3 Fatty Acids (FISH OIL PO), Take 2 tablets by mouth daily., Disp: , Rfl:  .  pramipexole (MIRAPEX) 0.5 MG tablet, TAKE ONE TABLET BY MOUTH AT BEDTIME, Disp: 90 tablet, Rfl: 0 .  saccharomyces boulardii  (FLORASTOR) 250 MG capsule, Take 1 capsule (250 mg total) by mouth 2 (two) times daily., Disp: 60 capsule, Rfl: 0 .  sulfamethoxazole-trimethoprim (BACTRIM DS,SEPTRA DS) 800-160 MG tablet, Take 1 tablet by mouth 2 (two) times daily., Disp: 14 tablet, Rfl: 0 .  temazepam (RESTORIL) 30 MG capsule, TAKE ONE CAPSULE BY MOUTH AT BEDTIME AS NEEDED FOR SLEEP, Disp: 30 capsule, Rfl: 5 .  terconazole (TERAZOL 7) 0.4 % vaginal cream, Place 1 applicator vaginally at bedtime., Disp: 45 g, Rfl: 1 .  VICTOZA 18 MG/3ML SOPN, INJECT 1.8MG  INTO THE SKIN DAILY., Disp: 9 pen, Rfl: 6 .  amoxicillin-clavulanate (AUGMENTIN) 875-125 MG tablet, Take 1 tablet by mouth 2 (two) times daily., Disp: 14 tablet, Rfl: 0 .  budesonide-formoterol (SYMBICORT) 160-4.5 MCG/ACT inhaler, Inhale 2 puffs into the lungs 2 (two) times daily., Disp: 2 Inhaler, Rfl: 0

## 2016-01-08 NOTE — Patient Instructions (Signed)
Use the flutter valve 4-5 breath, 4-5 times per day The Symbicort 2 sprays twice a day If your symptoms do not improve by Monday I recommend you take the antibiotic Follow-up in 2 weeks to go over your response to Symbicort with our nurse practitioner We will arrange for a CT chest in one year to evaluate the pulmonary nodule

## 2016-01-22 ENCOUNTER — Ambulatory Visit (INDEPENDENT_AMBULATORY_CARE_PROVIDER_SITE_OTHER): Payer: Medicare Other | Admitting: Adult Health

## 2016-01-22 ENCOUNTER — Encounter: Payer: Self-pay | Admitting: Adult Health

## 2016-01-22 DIAGNOSIS — I6523 Occlusion and stenosis of bilateral carotid arteries: Secondary | ICD-10-CM

## 2016-01-22 DIAGNOSIS — R911 Solitary pulmonary nodule: Secondary | ICD-10-CM

## 2016-01-22 DIAGNOSIS — J479 Bronchiectasis, uncomplicated: Secondary | ICD-10-CM

## 2016-01-22 NOTE — Patient Instructions (Addendum)
Use the flutter valve 4-5 breath, 4-5 times per day  Symbicort 2 sprays twice a day We will arrange for a CT chest in one year to evaluate the pulmonary nodule Follow up with Dr. Lake Bells in 4 months and As needed

## 2016-01-22 NOTE — Progress Notes (Signed)
Subjective:    Patient ID: [name], female    DOB: 08-Jan-1941, 75 y.o.   MRN: [id number]  HPI Synopsis: First evaluated by [facility] in 2017 in the setting of a chronic cough and recurrent respiratory infections. She had many childhood illnesses, but she doesn't recall one severe childhood illness.  She was found in July 2017 on high-resolution CT scanning of the chest to have bronchiectasis and severe tracheomalacia. Immunoglobulin testing an alpha-1 testing were normal. July 2017 pulmonary function testing ratio 77%, FEV1 2.02 L, 71% predicted, FVC 2.63 L 70% predicted, total lung capacity 5.02 L, 81% predicted, DLCO 25.7 274% predicted   01/22/16  Follow up : Bronchiectasis  Pt returns for 2 weeks follow up . She was seen 2 weeks ago, started on Symbicort.  She says she is doing some better. Her CT chest was reviewed: last ov  CT chest 12/04/15 neg for ILD, severe tracheobronchomalacia, mild bronchiectasiis , tiny scattered pulmonary nodules . Plans for repeat CT chest in 1 year.  She remains on flutter valve. .  No hemoptyiss, chest pain, orthopnea or edema.     Past Medical History:  Diagnosis Date  . Arthritis    right knee;injections every 16months   . Asthma   . Cancer (HCC)    HX BREAST CANCER  . COMPRESSION FRACTURE, LUMBAR VERTEBRAE 08/21/2008   Qualifier: Diagnosis of  By: Arnoldo Morale MD, Balinda Quails   . Depression   . Diabetes mellitus    takes Amaryl and Januvia daily  . Difficulty sleeping   . Diverticulitis   . Early cataracts, bilateral   . Eczema   . Endometriosis   . Gastritis   . Glaucoma   . Hammer toe   . Headache(784.0)    Migraines  . Hypertension    takes Atenolol daily  . IBS (irritable bowel syndrome)   . Joint pain   . Kidney stone   . Neuropathy ([place])   . Open wound of second toe of left foot   . Osteomyelitis ([place])    left 2nd toe  . Osteopenia   . PONV (postoperative nausea and vomiting)   . Posterior tibial tendon dysfunction    left foot  . Scoliosis   . 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 Vitamin D deficiency    takes Vit d daily   Current Outpatient Prescriptions on File Prior to Visit  Medication Sig Dispense Refill  . atenolol (TENORMIN) 25 MG tablet TAKE ONE TABLET BY MOUTH ONCE DAILY 90 tablet 1  . betamethasone dipropionate (DIPROLENE) 0.05 % cream Apply 1 application topically 2 (two) times daily as needed (dermatitis). 30 g 1  . cyanocobalamin (,VITAMIN B-12,) 1000 MCG/ML injection Inject 1 mL (1,000 mcg total) into the muscle every 30 (thirty) days. 1 mL 11  . dorzolamide-timolol (COSOPT) 22.3-6.8 MG/ML ophthalmic solution Place 1 drop into both eyes 2 (two) times daily.     [name] ESTRING 2 MG vaginal ring INSERT 1 RING VAGINALLY EVERY 4 MONTHS 1 each 3  . Flaxseed, Linseed, (FLAX SEED OIL PO) Take 1 tablet by mouth daily.    [name] FLUoxetine (PROZAC) 40 MG capsule Take 1 capsule (40 mg total) by mouth daily. 90 capsule 3  . glimepiride (AMARYL) 4 MG tablet TAKE ONE TABLET BY MOUTH DAILY WITH BREAKFAST. 90 tablet 3  . glucose blood (FREESTYLE LITE) test strip USE TO CHECK BLOOD SUGAR DAILY AND AS NEEDED 100 each 5  .  Isometheptene-Caffeine-APAP 65-20-325 MG TABS Take 1-2 tablets as needed for migraine twice a day maximum 30 tablet 5  . lisinopril (PRINIVIL,ZESTRIL) 10 MG tablet TAKE ONE TABLET BY MOUTH DAILY 90 tablet 3  . Loteprednol Etabonate (LOTEMAX) 0.5 % GEL Place 1 drop into the right eye 2 (two) times daily.    [name] LUMIGAN 0.01 % SOLN Place 1 drop into both eyes at bedtime.     . meloxicam (MOBIC) 15 MG tablet TAKE ONE TABLET BY MOUTH ONCE DAILY 90 tablet 2  . Methylcellulose, Laxative, (CITRUCEL) 500 MG TABS Take 2 tablets by mouth daily after supper.     . Omega-3 Fatty Acids (FISH OIL PO) Take 2 tablets by mouth daily.    . pramipexole (MIRAPEX) 0.5 MG tablet TAKE ONE TABLET BY MOUTH AT BEDTIME 90 tablet 0  . Respiratory Therapy Supplies (FLUTTER) DEVI Use as  directed 1 each 0  . temazepam (RESTORIL) 30 MG capsule TAKE ONE CAPSULE BY MOUTH AT BEDTIME AS NEEDED FOR SLEEP 30 capsule 5  . terconazole (TERAZOL 7) 0.4 % vaginal cream Place 1 applicator vaginally at bedtime. 45 g 1  . VICTOZA 18 MG/3ML SOPN INJECT 1.8MG  INTO THE SKIN DAILY. 9 pen 6  . albuterol (PROVENTIL HFA;VENTOLIN HFA) 108 (90 Base) MCG/ACT inhaler Inhale 2 puffs into the lungs every 6 (six) hours as needed for wheezing or shortness of breath. (Patient not taking: Reported on 01/22/2016) 1 Inhaler 2  . amoxicillin-clavulanate (AUGMENTIN) 875-125 MG tablet Take 1 tablet by mouth 2 (two) times daily. (Patient not taking: Reported on 01/22/2016) 14 tablet 0  . budesonide-formoterol (SYMBICORT) 160-4.5 MCG/ACT inhaler Inhale 2 puffs into the lungs 2 (two) times daily. (Patient not taking: Reported on 01/22/2016) 2 Inhaler 0  . Difluprednate (DUREZOL) 0.05 % EMUL Place 1 drop into the right eye every morning.    . saccharomyces boulardii (FLORASTOR) 250 MG capsule Take 1 capsule (250 mg total) by mouth 2 (two) times daily. (Patient not taking: Reported on 01/22/2016) 60 capsule 0   No current facility-administered medications on file prior to visit.      Review of Systems Constitutional:   No  weight loss, night sweats,  Fevers, chills, + fatigue, or  lassitude.  HEENT:   No headaches,  Difficulty swallowing,  Tooth/dental problems, or  Sore throat,                No sneezing, itching, ear ache, nasal congestion, post nasal drip,   CV:  No chest pain,  Orthopnea, PND, swelling in lower extremities, anasarca, dizziness, palpitations, syncope.   GI  No heartburn, indigestion, abdominal pain, nausea, vomiting, diarrhea, change in bowel habits, loss of appetite, bloody stools.   Resp:   No chest wall deformity  Skin: no rash or lesions.  GU: no dysuria, change in color of urine, no urgency or frequency.  No flank pain, no hematuria   MS:  No joint pain or swelling.  No decreased range of  motion.  No back pain.  Psych:  No change in mood or affect. No depression or anxiety.  No memory loss.         Objective:   Physical Exam Vitals:   01/22/16 1724  BP: 124/62  Pulse: 85  Temp: 98.2 F (36.8 C)  TempSrc: Oral  SpO2: 95%  Weight: 249 lb (112.9 kg)  Height: 6' (1.829 m)   GEN: A/Ox3; pleasant , NAD, elderly and obese    HEENT:  [place]/AT,  EACs-clear, TMs-wnl, NOSE-clear, THROAT-clear,  no lesions, no postnasal drip or exudate noted.   NECK:  Supple w/ fair ROM; no JVD; normal carotid impulses w/o bruits; no thyromegaly or nodules palpated; no lymphadenopathy.    RESP  Clear  P & A; w/o, wheezes/ rales/ or rhonchi. no accessory muscle use, no dullness to percussion  CARD:  RRR, no m/r/g  , no peripheral edema, pulses intact, no cyanosis or clubbing.  GI:   Soft & nt; nml bowel sounds; no organomegaly or masses detected.   Musco: Warm bil, no deformities or joint swelling noted.   Neuro: alert, no focal deficits noted.    Skin: Warm, no lesions or rashes  Marv Alfrey NP-C  [facility]  01/27/2016         Assessment & Plan:

## 2016-01-27 NOTE — Assessment & Plan Note (Signed)
Follow-up CT chest in 1 year 

## 2016-01-27 NOTE — Assessment & Plan Note (Signed)
Stable without flar e  Plan  Patient Instructions  Use the flutter valve 4-5 breath, 4-5 times per day  Symbicort 2 sprays twice a day We will arrange for a CT chest in one year to evaluate the pulmonary nodule Follow up with Dr. Lake Bells in 4 months and As needed

## 2016-01-30 ENCOUNTER — Other Ambulatory Visit: Payer: Self-pay | Admitting: Family Medicine

## 2016-02-06 ENCOUNTER — Other Ambulatory Visit: Payer: Self-pay | Admitting: Family Medicine

## 2016-02-09 DIAGNOSIS — D518 Other vitamin B12 deficiency anemias: Secondary | ICD-10-CM | POA: Diagnosis not present

## 2016-02-16 ENCOUNTER — Ambulatory Visit (INDEPENDENT_AMBULATORY_CARE_PROVIDER_SITE_OTHER): Payer: Medicare Other | Admitting: Family Medicine

## 2016-02-16 ENCOUNTER — Other Ambulatory Visit: Payer: Self-pay

## 2016-02-16 ENCOUNTER — Encounter: Payer: Self-pay | Admitting: Family Medicine

## 2016-02-16 VITALS — BP 136/72 | HR 86 | Temp 98.2°F | Wt 245.0 lb

## 2016-02-16 DIAGNOSIS — I700 Atherosclerosis of aorta: Secondary | ICD-10-CM | POA: Diagnosis not present

## 2016-02-16 DIAGNOSIS — E119 Type 2 diabetes mellitus without complications: Secondary | ICD-10-CM

## 2016-02-16 DIAGNOSIS — I6523 Occlusion and stenosis of bilateral carotid arteries: Secondary | ICD-10-CM

## 2016-02-16 DIAGNOSIS — E785 Hyperlipidemia, unspecified: Secondary | ICD-10-CM | POA: Diagnosis not present

## 2016-02-16 DIAGNOSIS — K580 Irritable bowel syndrome with diarrhea: Secondary | ICD-10-CM

## 2016-02-16 MED ORDER — CYANOCOBALAMIN 1000 MCG/ML IJ SOLN: 1000.0000 ug | INTRAMUSCULAR | 11 refills | Status: DC

## 2016-02-16 NOTE — Progress Notes (Signed)
Subjective:  [name] is a 75 y.o. year old very pleasant female patient who presents for/with See problem oriented charting ROS- No chest pain or shortness of breath. No headache or blurry vision. .see any ROS included in HPI as well.   Past Medical History-  Patient Active Problem List   Diagnosis Date Noted  . 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    Priority: High  . Bronchiectasis without acute exacerbation ([place]) 10/18/2015    Priority: High  . Diabetic ulcer of left foot associated with type 2 diabetes mellitus ([place]) 03/13/2014    Priority: High  . Diabetes mellitus type II, controlled ([place]) 12/28/2006    Priority: High  . Aortic atherosclerosis ([place]) 02/16/2016    Priority: Medium  . Solitary pulmonary nodule 01/08/2016    Priority: Medium  . IBS (irritable bowel syndrome) 07/17/2014    Priority: Medium  . Depression 01/03/2014    Priority: Medium  . Essential hypertension, benign 01/03/2014    Priority: Medium  . Fatty liver 03/18/2009    Priority: Medium  . Hyperlipidemia 07/26/2007    Priority: Medium  . ANEMIA, B12 DEFICIENCY 12/29/2006    Priority: Medium  . RESTLESS LEG SYNDROME, MILD 12/28/2006    Priority: Medium  . OSTEOPOROSIS 12/12/2006    Priority: Medium  . Acute diverticulitis 10/05/2015    Priority: Low  . CKD (chronic kidney disease), stage II 01/22/2014    Priority: Low  . Posterior tibial tendon dysfunction 01/03/2014    Priority: Low  . Osteoarthritis, knee 01/03/2014    Priority: Low  . Glaucoma 01/03/2014    Priority: Low  . Obesity (BMI 30-39.9) 06/15/2013    Priority: Low  . Foot tendinitis 07/20/2010    Priority: Low  . INSOMNIA, CHRONIC 07/25/2009    Priority: Low  . GERD 12/25/2007    Priority: Low  . NEUROPATHY, IDIOPATHIC PERIPHERAL NEC 12/28/2006    Priority: Low  . BREAST CANCER, HX OF 12/12/2006    Priority: Low    Medications- reviewed and updated Current Outpatient Prescriptions  Medication Sig Dispense Refill  .  albuterol (PROVENTIL HFA;VENTOLIN HFA) 108 (90 Base) MCG/ACT inhaler Inhale 2 puffs into the lungs every 6 (six) hours as needed for wheezing or shortness of breath. 1 Inhaler 2  . atenolol (TENORMIN) 25 MG tablet TAKE ONE TABLET BY MOUTH ONCE DAILY 90 tablet 1  . betamethasone dipropionate (DIPROLENE) 0.05 % cream Apply 1 application topically 2 (two) times daily as needed (dermatitis). 30 g 1  . cyanocobalamin (,VITAMIN B-12,) 1000 MCG/ML injection Inject 1 mL (1,000 mcg total) into the muscle every 30 (thirty) days. 1 mL 11  . Difluprednate (DUREZOL) 0.05 % EMUL Place 1 drop into the right eye every morning.    . dorzolamide-timolol (COSOPT) 22.3-6.8 MG/ML ophthalmic solution Place 1 drop into both eyes 2 (two) times daily.     [name] ESTRING 2 MG vaginal ring INSERT 1 RING VAGINALLY EVERY 4 MONTHS 1 each 3  . Flaxseed, Linseed, (FLAX SEED OIL PO) Take 1 tablet by mouth daily.    [name] FLUoxetine (PROZAC) 40 MG capsule Take 1 capsule (40 mg total) by mouth daily. 90 capsule 3  . glimepiride (AMARYL) 4 MG tablet TAKE ONE TABLET BY MOUTH ONCE DAILY WITH BREAKFAST 90 tablet 3  . glucose blood (FREESTYLE LITE) test strip USE TO CHECK BLOOD SUGAR DAILY AND AS NEEDED 100 each 5  . Isometheptene-Caffeine-APAP 65-20-325 MG TABS Take 1-2 tablets as needed for migraine twice a day maximum 30 tablet 5  .  lisinopril (PRINIVIL,ZESTRIL) 10 MG tablet TAKE ONE TABLET BY MOUTH DAILY 90 tablet 3  . Loteprednol Etabonate (LOTEMAX) 0.5 % GEL Place 1 drop into the right eye 2 (two) times daily.    [name] LUMIGAN 0.01 % SOLN Place 1 drop into both eyes at bedtime.     . Methylcellulose, Laxative, (CITRUCEL) 500 MG TABS Take 2 tablets by mouth daily after supper.     . Omega-3 Fatty Acids (FISH OIL PO) Take 2 tablets by mouth daily.    . pramipexole (MIRAPEX) 0.5 MG tablet TAKE ONE TABLET BY MOUTH AT BEDTIME 90 tablet 0  . Respiratory Therapy Supplies (FLUTTER) DEVI Use as directed 1 each 0  . temazepam (RESTORIL) 30 MG capsule  TAKE ONE CAPSULE BY MOUTH AT BEDTIME AS NEEDED FOR SLEEP 30 capsule 5  . terconazole (TERAZOL 7) 0.4 % vaginal cream Place 1 applicator vaginally at bedtime. 45 g 1  . VICTOZA 18 MG/3ML SOPN INJECT 1.8MG  INTO THE SKIN DAILY. 9 pen 6  . budesonide-formoterol (SYMBICORT) 160-4.5 MCG/ACT inhaler Inhale 2 puffs into the lungs 2 (two) times daily. (Patient not taking: Reported on 01/22/2016) 2 Inhaler 0   Objective: BP 136/72 (BP Location: Right Arm, Patient Position: Sitting, Cuff Size: Large)   Pulse 86   Temp 98.2 F (36.8 C) (Oral)   Wt 245 lb (111.1 kg)   LMP 05/10/1992   SpO2 94%   BMI 33.23 kg/m  Gen: NAD, resting comfortably CV: RRR no murmurs rubs or gallops Lungs: CTAB no crackles, wheeze, rhonchi. No respiratory distress Ext: no edema Skin: warm, dry, no rash  Assessment/Plan:  Diabetes mellitus type II, controlled ([place]) S: well controlled. On amaryl 4mg  and victoza Exercise and diet- patient states cannot lose weight because cannot be active with arthritis. Discussed diet primary driver for weigh tloss and she has low motivation with stress of caring for grandson (guardians) and helping with their adopted son.  Lab Results  Component Value Date   HGBA1C 6.0 10/13/2015   HGBA1C 6.0 10/17/2014   HGBA1C 6.3 01/17/2014   A/P: update a1c today   Aortic atherosclerosis (HCC) Hyperlipidemia S: noted on CT. We discussed importance of BP control and she has done well from this perspective. ALso lipid control- LDL 74 and discussed pushing to under 70 reasonable- she does not want to take a statin and add any meds at this point so we discussed weight loss- she is hesitant here- A/P: get direct LDL and discuss importance of weight loss with recheck vs. Statin and recheck. I would prefer weight changes if patient can become more motivated but may have to work on anxiety portion and stress eating before weight loss can be achieved.    IBS (irritable bowel syndrome) S:IBS has been  very difficult. Anxiety has been up some. This morning regular BM and then ate and had abdominal pain- rushed to go to bathroom and couldn't make it. Relief of symptoms afterwards A/P: lunch worst meal- add Imodium 2mg  30-45 minutes before lunch  Wants to return to Dr. Oneida Alar to get opinion about her potential hammertoe surgery  4 months  Orders Placed This Encounter  Procedures  . CBC    [place]  . Comprehensive metabolic panel    [place]  . LDL cholesterol, direct    [place]  . Hemoglobin A1c    [place] [facility]    Meds ordered this encounter  Medications  . cyanocobalamin (,VITAMIN B-12,) 1000 MCG/ML injection    Sig: Inject 1 mL (1,000 mcg total)  into the muscle every 30 (thirty) days.    Dispense:  1 mL    Refill:  11   Return precautions advised.  [name], MD

## 2016-02-16 NOTE — Patient Instructions (Addendum)
Take imodium 2mg  before lunch 30-45 minutes.   Consider cholesterol medicine depending on labs vs. Weight loss/healthier diet. Or we can focus on this when anxiety in better place. We discussed because of plaque on arteries that controlling cholesterol with bad cholesterol under 70 is idea  Call [facility]  Call Dr. Oneida Alar if you want another opinion (with more experience) on the foot surgery option  Labs before you go  Roselyn Reef will refill- Atenolol, prozac, amaryl, strips, lisinopril, victoza (90 day supplies)

## 2016-02-16 NOTE — Assessment & Plan Note (Addendum)
Hyperlipidemia S: noted on CT. We discussed importance of BP control and she has done well from this perspective. ALso lipid control- LDL 74 and discussed pushing to under 70 reasonable- she does not want to take a statin and add any meds at this point so we discussed weight loss- she is hesitant here- A/P: get direct LDL and discuss importance of weight loss with recheck vs. Statin and recheck. I would prefer weight changes if patient can become more motivated but may have to work on anxiety portion and stress eating before weight loss can be achieved.

## 2016-02-16 NOTE — Assessment & Plan Note (Signed)
S: well controlled. On amaryl 4mg  and victoza Exercise and diet- patient states cannot lose weight because cannot be active with arthritis. Discussed diet primary driver for weigh tloss and she has low motivation with stress of caring for grandson (guardians) and helping with their adopted son.  Lab Results  Component Value Date   HGBA1C 6.0 10/13/2015   HGBA1C 6.0 10/17/2014   HGBA1C 6.3 01/17/2014   A/P: update a1c today

## 2016-02-16 NOTE — Progress Notes (Signed)
Pre visit review using our clinic review tool, if applicable. No additional management support is needed unless otherwise documented below in the visit note. 

## 2016-02-16 NOTE — Assessment & Plan Note (Signed)
S:IBS has been very difficult. Anxiety has been up some. This morning regular BM and then ate and had abdominal pain- rushed to go to bathroom and couldn't make it. Relief of symptoms afterwards A/P: lunch worst meal- add Imodium 2mg  30-45 minutes before lunch

## 2016-02-17 ENCOUNTER — Other Ambulatory Visit: Payer: Self-pay

## 2016-02-17 DIAGNOSIS — E119 Type 2 diabetes mellitus without complications: Secondary | ICD-10-CM

## 2016-02-17 DIAGNOSIS — Z794 Long term (current) use of insulin: Principal | ICD-10-CM

## 2016-02-17 LAB — CBC
HCT: 40.4 % (ref 36.0–46.0)
HEMOGLOBIN: 13.5 g/dL (ref 12.0–15.0)
MCHC: 33.4 g/dL (ref 30.0–36.0)
MCV: 89.7 fl (ref 78.0–100.0)
PLATELETS: 265 10*3/uL (ref 150.0–400.0)
RBC: 4.5 Mil/uL (ref 3.87–5.11)
RDW: 14.5 % (ref 11.5–15.5)
WBC: 10.8 10*3/uL — AB (ref 4.0–10.5)

## 2016-02-17 LAB — COMPREHENSIVE METABOLIC PANEL
ALK PHOS: 81 U/L (ref 39–117)
ALT: 21 U/L (ref 0–35)
AST: 16 U/L (ref 0–37)
Albumin: 3.9 g/dL (ref 3.5–5.2)
BILIRUBIN TOTAL: 0.4 mg/dL (ref 0.2–1.2)
BUN: 23 mg/dL (ref 6–23)
CALCIUM: 9.6 mg/dL (ref 8.4–10.5)
CO2: 32 mEq/L (ref 19–32)
CREATININE: 0.7 mg/dL (ref 0.40–1.20)
Chloride: 101 mEq/L (ref 96–112)
GFR: 86.45 mL/min (ref >60.00)
Glucose, Bld: 106 mg/dL — ABNORMAL HIGH (ref 70–99)
Potassium: 4.8 mEq/L (ref 3.5–5.1)
Sodium: 141 mEq/L (ref 135–145)
TOTAL PROTEIN: 6.6 g/dL (ref 6.0–8.3)

## 2016-02-17 LAB — HEMOGLOBIN A1C: HEMOGLOBIN A1C: 6.1 % (ref 4.6–6.5)

## 2016-02-17 LAB — LDL CHOLESTEROL, DIRECT: Direct LDL: 77 mg/dL

## 2016-02-17 MED ORDER — LIRAGLUTIDE 18 MG/3ML ~~LOC~~ SOPN: PEN_INJECTOR | SUBCUTANEOUS | 6 refills | Status: DC

## 2016-02-17 MED ORDER — FLUOXETINE HCL 40 MG PO CAPS: 40.0000 mg | ORAL_CAPSULE | Freq: Every day | ORAL | 3 refills | Status: DC

## 2016-02-17 MED ORDER — GLIMEPIRIDE 4 MG PO TABS: ORAL_TABLET | ORAL | 3 refills | Status: DC

## 2016-02-17 MED ORDER — LISINOPRIL 10 MG PO TABS: 10.0000 mg | ORAL_TABLET | Freq: Every day | ORAL | 3 refills | Status: DC

## 2016-02-17 MED ORDER — GLUCOSE BLOOD VI STRP: ORAL_STRIP | 5 refills | Status: DC

## 2016-02-17 MED ORDER — ATENOLOL 25 MG PO TABS: 25.0000 mg | ORAL_TABLET | Freq: Every day | ORAL | 3 refills | Status: DC

## 2016-02-17 NOTE — Progress Notes (Signed)
Atenolol, Prozac, Amaryl, Strips, Lisinopril, and Victoza sent to [place]

## 2016-02-19 ENCOUNTER — Encounter: Payer: Self-pay | Admitting: Family Medicine

## 2016-02-19 ENCOUNTER — Telehealth: Payer: Self-pay | Admitting: Family Medicine

## 2016-02-19 DIAGNOSIS — K580 Irritable bowel syndrome with diarrhea: Secondary | ICD-10-CM

## 2016-02-19 NOTE — Telephone Encounter (Signed)
Pt saw dr hunter on Tuesday and had her rxs refilled. However,all scripts [facility]. [facility]. Pt would like this order stopped and all meds sent to  [facility]  [facility] [id number]

## 2016-02-20 ENCOUNTER — Other Ambulatory Visit: Payer: Self-pay

## 2016-02-20 DIAGNOSIS — E119 Type 2 diabetes mellitus without complications: Secondary | ICD-10-CM

## 2016-02-20 DIAGNOSIS — Z794 Long term (current) use of insulin: Principal | ICD-10-CM

## 2016-02-20 MED ORDER — GLUCOSE BLOOD VI STRP: ORAL_STRIP | 5 refills | Status: DC

## 2016-02-20 MED ORDER — LISINOPRIL 10 MG PO TABS: 10.0000 mg | ORAL_TABLET | Freq: Every day | ORAL | 3 refills | Status: DC

## 2016-02-20 MED ORDER — GLIMEPIRIDE 4 MG PO TABS: ORAL_TABLET | ORAL | 3 refills | Status: DC

## 2016-02-20 MED ORDER — LIRAGLUTIDE 18 MG/3ML ~~LOC~~ SOPN: PEN_INJECTOR | SUBCUTANEOUS | 6 refills | Status: DC

## 2016-02-20 MED ORDER — FLUOXETINE HCL 40 MG PO CAPS: 40.0000 mg | ORAL_CAPSULE | Freq: Every day | ORAL | 3 refills | Status: DC

## 2016-02-20 MED ORDER — ATENOLOL 25 MG PO TABS: 25.0000 mg | ORAL_TABLET | Freq: Every day | ORAL | 3 refills | Status: DC

## 2016-02-20 NOTE — Telephone Encounter (Signed)
Prescriptions sent to [facility] as requested.

## 2016-02-26 ENCOUNTER — Telehealth: Payer: Self-pay | Admitting: Pulmonary Disease

## 2016-02-26 MED ORDER — BUDESONIDE-FORMOTEROL FUMARATE 160-4.5 MCG/ACT IN AERO: 2.0000 | INHALATION_SPRAY | Freq: Two times a day (BID) | RESPIRATORY_TRACT | 5 refills | Status: DC

## 2016-02-26 NOTE — Telephone Encounter (Signed)
rx sent to preferred pharmacy. Pt aware and voiced understanding. Nothing further needed.  

## 2016-02-26 NOTE — Telephone Encounter (Signed)
lmtcb x1 for pt. 

## 2016-02-26 NOTE — Telephone Encounter (Signed)
Patient returned call, CB is [phone].

## 2016-02-27 ENCOUNTER — Telehealth: Payer: Self-pay | Admitting: Pulmonary Disease

## 2016-02-27 NOTE — Telephone Encounter (Signed)
BQ   [employer] called back and stated that the symbicort is not covered by the pts insurance but Breo and Advair are.  Would you be willing to change the symibicort to one of the other meds?  Please advise. Thanks  Allergies  Allergen Reactions  . Cephalexin Diarrhea    Patient can't remember reaction (per chart at [facility] states diarrhea)  . Erythromycin Ethylsuccinate Hives and Diarrhea  . Levaquin [Levofloxacin In D5w] Other (See Comments)    Pain in tendons   . Nitrofurantoin Other (See Comments)    Severe headache  . Percocet [Oxycodone-Acetaminophen] Itching

## 2016-03-01 MED ORDER — FLUTICASONE FUROATE-VILANTEROL 100-25 MCG/INH IN AEPB: 1.0000 | INHALATION_SPRAY | Freq: Every day | RESPIRATORY_TRACT | 5 refills | Status: DC

## 2016-03-01 NOTE — Telephone Encounter (Signed)
Spoke with pt. She is aware of the medication change. Rx has been sent in. Nothing further was needed. 

## 2016-03-01 NOTE — Telephone Encounter (Signed)
Patient called states that Symbicort not covered but she will have new insurance as of 05/2016 and would like samples to help get her to that point -pr

## 2016-03-01 NOTE — Telephone Encounter (Signed)
Breo 1 puff daily, 100 dose

## 2016-03-01 NOTE — Telephone Encounter (Signed)
Spoke with pt. Advised her that we can't supply her with samples to last until January due to the fact that we might not have them when she needs them. Her insurance covers Breo and Advair.  BQ - please advise on an alternative to Symbicort. Thanks.

## 2016-03-02 ENCOUNTER — Encounter: Payer: Self-pay | Admitting: Obstetrics & Gynecology

## 2016-03-02 ENCOUNTER — Ambulatory Visit (INDEPENDENT_AMBULATORY_CARE_PROVIDER_SITE_OTHER): Payer: Medicare Other | Admitting: Obstetrics & Gynecology

## 2016-03-02 VITALS — BP 142/68 | HR 102 | Resp 14 | Ht 72.0 in | Wt 251.6 lb

## 2016-03-02 DIAGNOSIS — I6523 Occlusion and stenosis of bilateral carotid arteries: Secondary | ICD-10-CM

## 2016-03-02 DIAGNOSIS — B373 Candidiasis of vulva and vagina: Secondary | ICD-10-CM | POA: Diagnosis not present

## 2016-03-02 DIAGNOSIS — B3731 Acute candidiasis of vulva and vagina: Secondary | ICD-10-CM

## 2016-03-02 MED ORDER — TERCONAZOLE 0.4 % VA CREA: 1.0000 | TOPICAL_CREAM | Freq: Every day | VAGINAL | 1 refills | Status: DC

## 2016-03-02 MED ORDER — ESTRADIOL 2 MG VA RING: 2.0000 mg | VAGINAL_RING | VAGINAL | 4 refills | Status: DC

## 2016-03-02 MED ORDER — FLUCONAZOLE 150 MG PO TABS: 150.0000 mg | ORAL_TABLET | Freq: Once | ORAL | 0 refills | Status: AC

## 2016-03-02 NOTE — Progress Notes (Signed)
GYNECOLOGY  VISIT   HPI: 75 y.o. G0P0 Married Caucasian female with a week's hx of vulvar itching.  She reports she's been using Vagisil and Vagisil anti-itch cream.  Pt states she had two Diflucan tablets at home and she took these the last three days. Denies urinary symptoms.  No pelvic pain.  No fever.  She does have her estring with her and needs this replaced.   Denies vaginal bleeding.  GYNECOLOGIC HISTORY: Patient's last menstrual period was 05/10/1992.  Past Medical History:  Diagnosis Date  . Arthritis    right knee;injections every 99months   . Asthma   . Cancer (HCC)    HX BREAST CANCER  . COMPRESSION FRACTURE, LUMBAR VERTEBRAE 08/21/2008   Qualifier: Diagnosis of  By: Arnoldo Morale MD, Balinda Quails   . Depression   . Diabetes mellitus    takes Amaryl and Januvia daily  . Difficulty sleeping   . Diverticulitis   . Early cataracts, bilateral   . Eczema   . Endometriosis   . Gastritis   . Glaucoma   . Hammer toe   . Headache(784.0)    Migraines  . Hypertension    takes Atenolol daily  . IBS (irritable bowel syndrome)   . Joint pain   . Kidney stone   . Neuropathy ([place])   . Open wound of second toe of left foot   . Osteomyelitis ([place])    left 2nd toe  . Osteopenia   . PONV (postoperative nausea and vomiting)   . Posterior tibial tendon dysfunction    left foot  . Scoliosis   . 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 Vitamin D deficiency    takes Vit d daily    Past Surgical History:  Procedure Laterality Date  . ADENOIDECTOMY     at age 72  . biopsy on back     benign  . BREAST SURGERY    . CATARACT EXTRACTION    . CHOLECYSTECTOMY  06/2008  . COLONOSCOPY  11/02/05  . DIAGNOSTIC LAPAROSCOPY    . DILATION AND CURETTAGE OF UTERUS    . ENDOMETRIAL BIOPSY  06/22/2011   benign polyp, no hyperplasia  . excision on breast     internal infected suture from breast surgery  . excision removed from neck     infected lymph node  .  FOOT SURGERY     left foot-cleaning out areas on toes at the wound center-having MRI to determine if osteomyelitis  . HYSTEROSCOPY  06/22/11   PMB submucosal myoma  . JOINT REPLACEMENT     left total shoulder  . LAPAROSCOPIC OOPHORECTOMY Right 12/2004   absent LSO  . LAPAROTOMY    . MASTECTOMY Bilateral 1994 right, 1995 left  . MASTECTOMY     Bilateral  . SHOULDER HEMI-ARTHROPLASTY  01/01/2012   Procedure: SHOULDER HEMI-ARTHROPLASTY;  Surgeon: Roseanne Kaufman, MD;  Location: [place];  Service: Orthopedics;  Laterality: Left;  Left Shoulder Hemi Arthroplasty with Repair and Reconstruction as Necessary   . THYROIDECTOMY     78yrs ago. follows endocrine  . TONSILLECTOMY    . TOTAL KNEE ARTHROPLASTY Right 12/30/2014   Procedure: RIGHT TOTAL KNEE ARTHROPLASTY;  Surgeon: Gaynelle Arabian, MD;  Location: [facility];  Service: Orthopedics;  Laterality: Right;    MEDS:  Reviewed in EPIC and UTD  ALLERGIES: Cephalexin; Erythromycin ethylsuccinate; Levaquin [levofloxacin in d5w]; Nitrofurantoin; and Percocet [oxycodone-acetaminophen]  Family History  Problem Relation Age of Onset  .  Arthritis Mother   . COPD Father   . Heart disease Father     MI 42  . Hypertension Father   . Hyperlipidemia Father   . Colon cancer Neg Hx   . Esophageal cancer Neg Hx   . Rectal cancer Neg Hx   . Stomach cancer Neg Hx     SH:  Married, non smoker  Review of Systems  Constitutional: Negative.   Gastrointestinal: Negative.   Genitourinary:       Vaginal and vulvar itching    PHYSICAL EXAMINATION:    BP (!) 142/68 (BP Location: Right Arm) Comment: recheck for patient  Pulse (!) 102   Resp 14   Ht 6' (1.829 m)   Wt 251 lb 9.6 oz (114.1 kg)   LMP 05/10/1992   BMI 34.12 kg/m     General appearance: alert, cooperative and appears stated age  Pelvic: External genitalia:  no lesions              Urethra:  normal appearing urethra with no masses, tenderness or lesions              Bartholins and Skenes:  normal                 Vagina: normal appearing vagina with normal color and adherent whitish discharge present, no lesions              Cervix: no lesions              Bimanual Exam:  Uterus:  normal size, contour, position, consistency, mobility, non-tender              Adnexa: no mass, fullness, tenderness              Anus:  normal sphincter tone, no lesions  Estring removed and new one replaced.  Chaperone was present for exam.  Assessment: Yeast vaginitis Vaginal atrophic symptoms, uses estring  Plan: Continue estring use.  Rx given for 2mg  q 3 months.  #1/4RF Rx for terazol to pharmacy (with one RF and Diflucan 150mg  po x 1, repeat 72 hrs as needed.  #2/0RF.

## 2016-03-03 ENCOUNTER — Encounter: Payer: Self-pay | Admitting: Sports Medicine

## 2016-03-03 ENCOUNTER — Ambulatory Visit (INDEPENDENT_AMBULATORY_CARE_PROVIDER_SITE_OTHER): Payer: Medicare Other | Admitting: Sports Medicine

## 2016-03-03 VITALS — BP 148/74 | Ht 72.0 in | Wt 245.0 lb

## 2016-03-03 DIAGNOSIS — M19079 Primary osteoarthritis, unspecified ankle and foot: Secondary | ICD-10-CM

## 2016-03-03 DIAGNOSIS — M79672 Pain in left foot: Secondary | ICD-10-CM | POA: Diagnosis not present

## 2016-03-03 DIAGNOSIS — M2042 Other hammer toe(s) (acquired), left foot: Secondary | ICD-10-CM | POA: Diagnosis not present

## 2016-03-03 DIAGNOSIS — M2041 Other hammer toe(s) (acquired), right foot: Secondary | ICD-10-CM | POA: Diagnosis not present

## 2016-03-03 DIAGNOSIS — I6523 Occlusion and stenosis of bilateral carotid arteries: Secondary | ICD-10-CM | POA: Diagnosis not present

## 2016-03-03 NOTE — Progress Notes (Signed)
CC: Left foot hammertoes  Patient has significant chronic breakdown of the left foot The foot is externally rotated and subluxed at the subtalar joint She has severe hammertoes from toes 2-5  These are now causing her to get ulcers She has diabetes and these have been difficult to heal She was advised by a podiatrist consider an office procedure to straighten the hammertoes one at a time  Dr. Yong Channel suggested that she come back to see me for my opinion   Past history Osteoarthritis of the knees with replacement of the right knee Left knee has grade 4 changes Type 2 diabetes with history of foot ulcers A number of chronic medical problems as reviewed in her problem list  Social history She is married and lives with her husband  Review of systems She does get some pain and burning in her feet Pain at the dorsum of the PIP joints of the foot Pain at the tips of the toes particularly on the left foot No fever or chills  Physical examination Pleasant, obese white female in no acute distress BP (!) 148/74   Ht 6' (1.829 m)   Wt 245 lb (111.1 kg)   LMP 05/10/1992   BMI 33.23 kg/m   Left foot Understanding the foot subluxes at the subtalar joint There is external rotation of the foot by at least 30 The navicular and midfoot have pronated with loss of support from both the posterior tibialis tendon and the spring ligament There is arthritic change at the first TMT joint There is arthritic change at the first MTP joint and slight valgus shift There are significant hammertoes on 23 and 4  Bursal nodules on the PIP joints Calluses at the tip of the second and third toes Fifth toe is rigidly plantar flexed and hammered

## 2016-03-03 NOTE — Assessment & Plan Note (Signed)
She needs good orthotic support to stabilize her foot during walking  I suspect she may be developing some Charcot changes

## 2016-03-03 NOTE — Assessment & Plan Note (Signed)
Considering her diabetes and history of ulcers I would be very concerned about an office procedure to try to straighten these I am concerned that unless we also focused on the arthritic breakdown of the rest of the foot she might not come out with a functional gait  I suggested seeing Dr. Daylene Katayama, who is a foot and ankle trained podiatrist for his opinion  She has had other opinions at [facility] and at [facility] ortho

## 2016-03-10 DIAGNOSIS — M1712 Unilateral primary osteoarthritis, left knee: Secondary | ICD-10-CM | POA: Diagnosis not present

## 2016-03-10 DIAGNOSIS — M7122 Synovial cyst of popliteal space [Baker], left knee: Secondary | ICD-10-CM | POA: Diagnosis not present

## 2016-03-12 DIAGNOSIS — D692 Other nonthrombocytopenic purpura: Secondary | ICD-10-CM | POA: Diagnosis not present

## 2016-03-12 DIAGNOSIS — D1801 Hemangioma of skin and subcutaneous tissue: Secondary | ICD-10-CM | POA: Diagnosis not present

## 2016-03-12 DIAGNOSIS — D1724 Benign lipomatous neoplasm of skin and subcutaneous tissue of left leg: Secondary | ICD-10-CM | POA: Diagnosis not present

## 2016-03-12 DIAGNOSIS — L821 Other seborrheic keratosis: Secondary | ICD-10-CM | POA: Diagnosis not present

## 2016-03-12 DIAGNOSIS — L814 Other melanin hyperpigmentation: Secondary | ICD-10-CM | POA: Diagnosis not present

## 2016-03-12 DIAGNOSIS — D2239 Melanocytic nevi of other parts of face: Secondary | ICD-10-CM | POA: Diagnosis not present

## 2016-03-12 DIAGNOSIS — D225 Melanocytic nevi of trunk: Secondary | ICD-10-CM | POA: Diagnosis not present

## 2016-03-17 ENCOUNTER — Ambulatory Visit (INDEPENDENT_AMBULATORY_CARE_PROVIDER_SITE_OTHER): Payer: Medicare Other

## 2016-03-17 ENCOUNTER — Ambulatory Visit (INDEPENDENT_AMBULATORY_CARE_PROVIDER_SITE_OTHER): Payer: Medicare Other | Admitting: Podiatry

## 2016-03-17 ENCOUNTER — Encounter: Payer: Self-pay | Admitting: Podiatry

## 2016-03-17 VITALS — BP 100/66 | HR 99

## 2016-03-17 DIAGNOSIS — R52 Pain, unspecified: Secondary | ICD-10-CM

## 2016-03-17 DIAGNOSIS — I6523 Occlusion and stenosis of bilateral carotid arteries: Secondary | ICD-10-CM | POA: Diagnosis not present

## 2016-03-17 DIAGNOSIS — M2040 Other hammer toe(s) (acquired), unspecified foot: Secondary | ICD-10-CM

## 2016-03-17 DIAGNOSIS — M19072 Primary osteoarthritis, left ankle and foot: Secondary | ICD-10-CM

## 2016-03-17 DIAGNOSIS — M2142 Flat foot [pes planus] (acquired), left foot: Secondary | ICD-10-CM

## 2016-03-17 DIAGNOSIS — M2042 Other hammer toe(s) (acquired), left foot: Secondary | ICD-10-CM | POA: Diagnosis not present

## 2016-03-17 DIAGNOSIS — M76822 Posterior tibial tendinitis, left leg: Secondary | ICD-10-CM

## 2016-03-19 DIAGNOSIS — D518 Other vitamin B12 deficiency anemias: Secondary | ICD-10-CM | POA: Diagnosis not present

## 2016-03-21 NOTE — Progress Notes (Signed)
Subjective:  Patient presents today as a referral from Dr. Oneida Alar, sports medicine, for evaluation of a symptomatic flatfoot to the left lower extremity. Patient states that she noticed a collapse of her arch in the left foot over the past few years. Patient notices significant pain associated with a flat foot. Patient also noticed hammertoe deformities which developed concurrently.  Patient has attempted multiple conservative options including custom molded AFO, shoe gear modifications, physical therapy modalities, all of which have failed to alleviate symptoms.  Patient presents today for further treatment and evaluation and possible surgical consult.    Objective/Physical Exam General: The patient is alert and oriented x3 in no acute distress.  Dermatology: Skin is warm, dry and supple bilateral lower extremities. Negative for open lesions or macerations.  Vascular: Palpable pedal pulses bilaterally. No edema or erythema noted. Capillary refill within normal limits.  Neurological: Epicritic and protective threshold grossly intact bilaterally.   Musculoskeletal Exam: Medial longitudinal arch collapse with excessive pronation and rear foot valgus upon mid stance left lower extremity. Rigid hammertoe contracture digits 2-5 left foot. Pain on palpation also noted along the posterior tibial tendon left foot.  Radiographic Exam:  Generalized degenerative joint disease noted throughout the joints of the ankle and foot left lower extremity. Radiographic evidence of rigid hammertoe contracture digits 2-5 left foot.  Assessment: #1 posterior tibial tendon dysfunction with arthritic changes left lower extremity #2 adult acquired flatfoot deformity left lower extremity #3 hammertoes digits 2-5 left #4 pain in left foot   Plan of Care:  #1 Patient was evaluated. #2 discussed at length, and in detail the conservative versus surgical management of posterior tibial tendon dysfunction. The patient  is currently considering surgical option which would include triple arthrodesis to the left foot. #3 all patient questions were answered, and thorough, detail explanation of procedure and recovery were explained. No guarantees were expressed or implied. #4 recommended to the patient that she goes home and discusses the surgical option with her husband. The patient will likely need surgical correction to correct for posterior tibial dysfunction with degenerative changes of the left foot. #5 return to clinic in 4 weeks   Dr. Edrick Kins, [place]

## 2016-04-08 DIAGNOSIS — I739 Peripheral vascular disease, unspecified: Secondary | ICD-10-CM | POA: Diagnosis not present

## 2016-04-08 DIAGNOSIS — E1151 Type 2 diabetes mellitus with diabetic peripheral angiopathy without gangrene: Secondary | ICD-10-CM | POA: Diagnosis not present

## 2016-04-08 DIAGNOSIS — L84 Corns and callosities: Secondary | ICD-10-CM | POA: Diagnosis not present

## 2016-04-08 DIAGNOSIS — L603 Nail dystrophy: Secondary | ICD-10-CM | POA: Diagnosis not present

## 2016-04-09 DIAGNOSIS — M79672 Pain in left foot: Secondary | ICD-10-CM | POA: Diagnosis not present

## 2016-04-12 ENCOUNTER — Ambulatory Visit: Payer: Medicare Other | Admitting: Podiatry

## 2016-04-20 DIAGNOSIS — D518 Other vitamin B12 deficiency anemias: Secondary | ICD-10-CM | POA: Diagnosis not present

## 2016-04-29 ENCOUNTER — Other Ambulatory Visit: Payer: Self-pay | Admitting: Family Medicine

## 2016-04-29 ENCOUNTER — Ambulatory Visit (INDEPENDENT_AMBULATORY_CARE_PROVIDER_SITE_OTHER): Payer: Medicare Other | Admitting: Gastroenterology

## 2016-04-29 ENCOUNTER — Encounter: Payer: Self-pay | Admitting: Gastroenterology

## 2016-04-29 VITALS — BP 132/78 | HR 80 | Ht 72.0 in | Wt 256.8 lb

## 2016-04-29 DIAGNOSIS — I6523 Occlusion and stenosis of bilateral carotid arteries: Secondary | ICD-10-CM

## 2016-04-29 DIAGNOSIS — K580 Irritable bowel syndrome with diarrhea: Secondary | ICD-10-CM | POA: Diagnosis not present

## 2016-04-29 DIAGNOSIS — R152 Fecal urgency: Secondary | ICD-10-CM

## 2016-04-29 DIAGNOSIS — R197 Diarrhea, unspecified: Secondary | ICD-10-CM | POA: Diagnosis not present

## 2016-04-29 MED ORDER — CHOLESTYRAMINE 4 G PO PACK: 4.0000 g | PACK | Freq: Three times a day (TID) | ORAL | 3 refills | Status: DC

## 2016-04-29 NOTE — Progress Notes (Signed)
[name]    [id number]    [birth year]  Primary Care Physician:Stephen Yong Channel, MD  Referring Physician: Marin Olp, MD [address] [place], [place] [address]  Chief complaint:  Diarrhea  HPI: 75 year-old white female with history of irritable bowel syndrome predominant diarrhea , previously followed by Dr. Olevia Perches last seen in office in February 2015 is here for follow-up visit . Her main complaint is fecal urgency daily basis, is worse  after she eats out at [employer]. She is afraid to eat out. She is an insulin-dependent diabetic. She has had 3 prior colonoscopies. Her first one was in 2000 and her most recent one was in February 2015 which showed mild diverticulosis and hemorrhoids.  She does drink milk and also eats cheese and frozen yogurt on daily basis. Denies any nausea, vomiting, abdominal pain, melena or bright red blood per rectum  Other pertinent history: An upper abdominal ultrasound in August 2010 showed her to be status post cholecystectomy , liver was fatty.. Her common bile duct was 5 mm and splenic size was 12 cm. She has a history of breast cancer. She is status post bilateral mastectomy in 1994 and 1995.       Outpatient Encounter Prescriptions as of 04/29/2016  Medication Sig  . albuterol (PROVENTIL HFA;VENTOLIN HFA) 108 (90 Base) MCG/ACT inhaler Inhale 2 puffs into the lungs every 6 (six) hours as needed for wheezing or shortness of breath.  [name] atenolol (TENORMIN) 25 MG tablet Take 1 tablet (25 mg total) by mouth daily.  . betamethasone dipropionate (DIPROLENE) 0.05 % cream Apply 1 application topically 2 (two) times daily as needed (dermatitis).  . cyanocobalamin (,VITAMIN B-12,) 1000 MCG/ML injection Inject 1 mL (1,000 mcg total) into the muscle every 30 (thirty) days.  . dorzolamide-timolol (COSOPT) 22.3-6.8 MG/ML ophthalmic solution Place 1 drop into both eyes 2 (two) times daily.   [name] estradiol (ESTRING) 2 MG vaginal ring Place  2 mg vaginally every 3 (three) months. follow package directions  . Flaxseed, Linseed, (FLAX SEED OIL PO) Take 1 tablet by mouth daily.  . fluconazole (DIFLUCAN) 150 MG tablet   . FLUoxetine (PROZAC) 40 MG capsule Take 1 capsule (40 mg total) by mouth daily.  [name] glimepiride (AMARYL) 4 MG tablet TAKE ONE TABLET BY MOUTH ONCE DAILY WITH BREAKFAST  . glucose blood (FREESTYLE LITE) test strip USE TO CHECK BLOOD SUGAR DAILY AND AS NEEDED  . Isometheptene-Caffeine-APAP 65-20-325 MG TABS Take 1-2 tablets as needed for migraine twice a day maximum  . liraglutide (VICTOZA) 18 MG/3ML SOPN INJECT 1.8MG  INTO THE SKIN DAILY.  [name] lisinopril (PRINIVIL,ZESTRIL) 10 MG tablet Take 1 tablet (10 mg total) by mouth daily.  [name] LUMIGAN 0.01 % SOLN Place 1 drop into both eyes at bedtime.   . Methylcellulose, Laxative, (CITRUCEL) 500 MG TABS Take 2 tablets by mouth daily after supper.   . Omega-3 Fatty Acids (FISH OIL PO) Take 2 tablets by mouth daily.  . pramipexole (MIRAPEX) 0.5 MG tablet TAKE ONE TABLET BY MOUTH AT BEDTIME  . Respiratory Therapy Supplies (FLUTTER) DEVI Use as directed  . temazepam (RESTORIL) 30 MG capsule TAKE ONE CAPSULE BY MOUTH AT BEDTIME AS NEEDED FOR SLEEP  . terconazole (TERAZOL 7) 0.4 % vaginal cream Place 1 applicator vaginally at bedtime.  . budesonide-formoterol (SYMBICORT) 160-4.5 MCG/ACT inhaler Inhale 2 puffs into the lungs 2 (two) times daily.  . [DISCONTINUED] Difluprednate (DUREZOL) 0.05 % EMUL Place 1  drop into the right eye every morning.  . [DISCONTINUED] fluticasone furoate-vilanterol (BREO ELLIPTA) 100-25 MCG/INH AEPB Inhale 1 puff into the lungs daily. (Patient not taking: Reported on 03/17/2016)   No facility-administered encounter medications on file as of 04/29/2016.     Allergies as of 04/29/2016 - Review Complete 04/29/2016  Allergen Reaction Noted  . Cephalexin Diarrhea 10/14/2006  . Erythromycin ethylsuccinate Hives and Diarrhea 10/14/2006  . Levaquin [levofloxacin in  d5w] Other (See Comments) 12/18/2014  . Nitrofurantoin Other (See Comments) 10/14/2006  . Percocet [oxycodone-acetaminophen] Itching 12/31/2011    Past Medical History:  Diagnosis Date  . Arthritis    right knee;injections every 36months   . Asthma   . Cancer (HCC)    HX BREAST CANCER  . COMPRESSION FRACTURE, LUMBAR VERTEBRAE 08/21/2008   Qualifier: Diagnosis of  By: Arnoldo Morale MD, Balinda Quails   . Depression   . Diabetes mellitus    takes Amaryl and Januvia daily  . Difficulty sleeping   . Diverticulitis   . Early cataracts, bilateral   . Eczema   . Endometriosis   . Gastritis   . Glaucoma   . Hammer toe   . Headache(784.0)    Migraines  . Hypertension    takes Atenolol daily  . IBS (irritable bowel syndrome)   . Joint pain   . Kidney stone   . Neuropathy ([place])   . Open wound of second toe of left foot   . Osteomyelitis ([place])    left 2nd toe  . Osteopenia   . PONV (postoperative nausea and vomiting)   . Posterior tibial tendon dysfunction    left foot  . Scoliosis   . 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 Vitamin D deficiency    takes Vit d daily    Past Surgical History:  Procedure Laterality Date  . ADENOIDECTOMY     at age 73  . biopsy on back     benign  . CATARACT EXTRACTION    . CHOLECYSTECTOMY  06/2008  . COLONOSCOPY  11/02/05  . DILATION AND CURETTAGE OF UTERUS    . ENDOMETRIAL BIOPSY  06/22/2011   benign polyp, no hyperplasia  . excision on breast     internal infected suture from breast surgery  . excision removed from neck     infected lymph node  . FOOT SURGERY     left foot-cleaning out areas on toes at the wound center-having MRI to determine if osteomyelitis  . HYSTEROSCOPY  06/22/11   PMB submucosal myoma  . JOINT REPLACEMENT     left total shoulder  . LAPAROSCOPIC OOPHORECTOMY Right 12/2004   absent LSO  . LAPAROTOMY    . MASTECTOMY Bilateral 1994 right, 1995 left  . SHOULDER HEMI-ARTHROPLASTY  01/01/2012     Procedure: SHOULDER HEMI-ARTHROPLASTY;  Surgeon: Roseanne Kaufman, MD;  Location: [facility];  Service: Orthopedics;  Laterality: Left;  Left Shoulder Hemi Arthroplasty with Repair and Reconstruction as Necessary   . THYROIDECTOMY     73yrs ago. follows endocrine  . TONSILLECTOMY    . TOTAL KNEE ARTHROPLASTY Right 12/30/2014   Procedure: RIGHT TOTAL KNEE ARTHROPLASTY;  Surgeon: Gaynelle Arabian, MD;  Location: [facility];  Service: Orthopedics;  Laterality: Right;    Family History  Problem Relation Age of Onset  . Arthritis Mother   . COPD Father   . Heart disease Father     MI 96  . Hypertension Father   . Hyperlipidemia Father   .  Colon cancer Neg Hx   . Esophageal cancer Neg Hx   . Rectal cancer Neg Hx   . Stomach cancer Neg Hx     Social History   Social History  . Marital status: Married    Spouse name: N/A  . Number of children: N/A  . Years of education: N/A   Occupational History  . Not on file.   Social History Main Topics  . Smoking status: Never Smoker  . Smokeless tobacco: Never Used  . Alcohol use No  . Drug use: No  . Sexual activity: Not on file   Other Topics Concern  . Not on file   Social History Narrative   Married 39 years in 2015. 1 adopted son. 1 grandkid (76 yo grandson)      Retired from Development worker, international aid at [employer]: fashion, tv shopping, sewing, decorate      Review of systems: Review of Systems  Constitutional: Negative for fever and chills.  HENT: Negative.   Eyes: Negative for blurred vision.  Respiratory: Negative for cough, shortness of breath and wheezing.   Cardiovascular: Negative for chest pain and palpitations.  Gastrointestinal: as per HPI Genitourinary: Negative for dysuria, urgency, frequency and hematuria.  Musculoskeletal: Negative for myalgias, back pain and joint pain.  Skin: Negative for itching and rash.  Neurological: Negative for dizziness, tremors, focal weakness, seizures and loss of consciousness.   Endo/Heme/Allergies: Positive for seasonal allergies.  Psychiatric/Behavioral: Negative for depression, suicidal ideas and hallucinations.  All other systems reviewed and are negative.   Physical Exam: Vitals:   04/29/16 1425  BP: 132/78  Pulse: 80   Body mass index is 34.83 kg/m. Gen:      No acute distress HEENT:  EOMI, sclera anicteric Neck:     No masses; no thyromegaly Lungs:    Clear to auscultation bilaterally; normal respiratory effort CV:         Regular rate and rhythm; no murmurs Abd:      + bowel sounds; soft, non-tender; no palpable masses, no distension Ext:    No edema; adequate peripheral perfusion Skin:      Warm and dry; no rash Neuro: alert and oriented x 3 Psych: normal mood and affect  Data Reviewed:  Reviewed labs, radiology imaging, old records and pertinent past GI work up   Assessment and Plan/Recommendations:  75 year old female with history of irritable bowel syndrome predominant diarrhea here with complaints of worsening fecal urgency Patient may have a complement of lactose intolerance making her IBS-D symptoms worse Advised patient to follow a lactose-free diet She is status post cholecystectomy, we will do a trial of cholestyramine to see if bile acid diarrhea is playing a role Return in 3 months or sooner if needed 25 minutes was spent face-to-face with the patient. Greater than 50% of the time used for counseling as well as treatment plan and follow-up. She had multiple questions which were answered to her satisfaction  K. Denzil Magnuson , MD [phone] Mon-Fri 8a-5p [phone] after 5p, weekends, holidays  CC: Marin Olp, MD

## 2016-04-29 NOTE — Patient Instructions (Signed)
Lactose-Free Diet, Adult Introduction If you have lactose intolerance, you are not able to digest lactose. Lactose is a natural sugar found mainly in milk and milk products. You may need to avoid all foods and beverages that contain lactose. A lactose-free diet can help you do this. What do I need to know about this diet?  Do not consume foods, beverages, vitamins, minerals, or medicines with lactose. Read ingredients lists carefully.  Look for the words "lactose-free" on labels.  Use lactase enzyme drops or tablets as directed by your health care provider.  Use lactose-free milk or a milk alternative, such as soy milk, for drinking and cooking.  Make sure you get enough calcium and vitamin D in your diet. A lactose-free eating plan can be lacking in these important nutrients.  Take calcium and vitamin D supplements as directed by your health care provider. Talk to your provider about supplements if you are not able to get enough calcium and vitamin D from food. Which foods have lactose? Lactose is found in:  Milk and foods made from milk.  Yogurt.  Cheese.  Butter.  Margarine.  Sour cream.  Cream.  Whipped toppings and nondairy creamers.  Ice cream and other milk-based desserts. Lactose is also found in foods or products made with milk or milk ingredients. To find out whether a food contains milk or a milk ingredient, look at the ingredients list. Avoid foods with the statement "May contain milk" and foods that contain:  Butter.  Cream.  Milk.  Milk solids.  Milk powder.  Whey.  Curd.  Caseinate.  Lactose.  Lactalbumin.  Lactoglobulin. What are some alternatives to milk and foods made with milk products?  Lactose-free milk.  Soy milk with added calcium and vitamin D.  Almond, coconut, or rice milk with added calcium and vitamin D. Note that these are low in protein.  Soy products, such as soy yogurt, soy cheese, soy ice cream, and soy-based sour  cream. Which foods can I eat? Grains  Breads and rolls made without milk, such as [place], [place], or [place] bread, bagels, pita, and [employer]. Corn tortillas, corn meal, grits, and polenta. Crackers without lactose or milk solids, such as soda crackers and graham crackers. Cooked or dry cereals without lactose or milk solids. Pasta, quinoa, couscous, barley, oats, bulgur, farro, rice, wild rice, or other grains prepared without milk or lactose. Plain popcorn. Vegetables  Fresh, frozen, and canned vegetables without cheese, cream, or butter sauces. Fruits  All fresh, canned, frozen, or dried fruits that are not processed with lactose. Meats and Other Protein Sources  Plain beef, chicken, fish, [place], lamb, veal, pork, wild game, or ham. Kosher-prepared meat products. Strained or junior meats that do not contain milk. Eggs. Soy meat substitutes. Beans, lentils, and hummus. Tofu. Nuts and seeds. Peanut or other nut butters without lactose. Soups, casseroles, and mixed dishes without cheese, cream, or milk. Dairy  Lactose-free milk. Soy, rice, or almond milk with added calcium and vitamin D. Soy cheese and yogurt. Beverages  Carbonated drinks. Tea. Coffee, freeze-dried coffee, and some instant coffees. Fruit and vegetable juices. Condiments  Soy sauce. Carob powder. Olives. Gravy made with water. Baker's cocoa. Angie Fava. Pure seasonings and spices. Ketchup. Mustard. Bouillon. Broth. Sweets and Desserts  Water and fruit ices. Gelatin. Cookies, pies, or cakes made from allowed ingredients, such as angel food cake. Pudding made with water or a milk substitute. Lactose-free tofu desserts. Soy, coconut milk, or rice-milk-based frozen desserts. Sugar. Honey. Jam, jelly, and marmalade.  Molasses. Pure sugar candy. Dark chocolate without milk. Marshmallows. Fats and Oils  Margarines and salad dressings that do not contain milk. Berniece Salines. Vegetable oils. Shortening. Mayonnaise. Soy or coconut-based cream. The items  listed above may not be a complete list of recommended foods or beverages. Contact your dietitian for more options.  Which foods are not recommended? Grains  Breads and rolls that contain milk. Toaster pastries. Muffins, biscuits, waffles, cornbread, and pancakes. These can be prepared at home, commercial, or from mixes. Sweet rolls, donuts, English muffins, fry bread, lefse, flour tortillas with lactose, or [place] toast made with milk or milk ingredients. Crackers that contain lactose. Corn curls. Cooked or dry cereals with lactose. Vegetables  Creamed or breaded vegetables. Vegetables in a cheese or butter sauce or with lactose-containing margarines. Instant potatoes. [place] fries. Scalloped or au gratin potatoes. Fruits  None. Meats and Other Protein Sources  Scrambled eggs, omelets, and souffles that contain milk. Creamed or breaded meat, fish, chicken, or [place]. Sausage products, such as wieners and liver sausage. Cold cuts that contain milk solids. Cheese, cottage cheese, ricotta cheese, and cheese spreads. Lasagna and macaroni and cheese. Pizza. Peanut or other nut butters with added milk solids. Casseroles or mixed dishes containing milk or cheese. Dairy  All dairy products, including milk, goat's milk, buttermilk, kefir, acidophilus milk, flavored milk, evaporated milk, condensed milk, dulce de Reedy, eggnog, yogurt, cheese, and cheese spreads. Beverages  Hot chocolate. Cocoa with lactose. Instant iced teas. Powdered fruit drinks. Smoothies made with milk or yogurt. Condiments  Chewing gum that has lactose. Cocoa that has lactose. Spice blends if they contain milk products. Artificial sweeteners that contain lactose. Nondairy creamers. Sweets and Desserts  Ice cream, ice milk, gelato, sherbet, and frozen yogurt. Custard, pudding, and mousse. Cake, cream pies, cookies, and other desserts containing milk, cream, cream cheese, or milk chocolate. Pie crust made with milk-containing margarine  or butter. Reduced-calorie desserts made with a sugar substitute that contains lactose. Toffee and butterscotch. Milk, white, or dark chocolate that contains milk. Fudge. Caramel. Fats and Oils  Margarines and salad dressings that contain milk or cheese. Cream. Half and half. Cream cheese. Sour cream. Chip dips made with sour cream or yogurt. The items listed above may not be a complete list of foods and beverages to avoid. Contact your dietitian for more information.  Am I getting enough calcium? Calcium is found in many foods that contain lactose and is important for bone health. The amount of calcium you need depends on your age:  Adults younger than 50 years: 1000 mg of calcium a day.  Adults older than 50 years: 1200 mg of calcium a day. If you are not getting enough calcium, other calcium sources include:  Orange juice with calcium added. There are 300-350 mg of calcium in 1 cup of orange juice.  Sardines with edible bones. There are 325 mg of calcium in 3 oz of sardines.  Calcium-fortified soy milk. There are 300-400 mg of calcium in 1 cup of calcium-fortified soy milk.  Calcium-fortified rice or almond milk. There are 300 mg of calcium in 1 cup of calcium-fortified rice or almond milk.  Canned salmon with edible bones. There are 180 mg of calcium in 3 oz of canned salmon with edible bones.  Calcium-fortified breakfast cereals. There are [phone] mg of calcium in calcium-fortified breakfast cereals.  Tofu set with calcium sulfate. There are 250 mg of calcium in  cup of tofu set with calcium sulfate.  Spinach, cooked. There are  145 mg of calcium in  cup of cooked spinach.  Edamame, cooked. There are 130 mg of calcium in  cup of cooked edamame.  Collard greens, cooked. There are 125 mg of calcium in  cup of cooked collard greens.  Kale, frozen or cooked. There are 90 mg of calcium in  cup of cooked or frozen kale.  Almonds. There are 95 mg of calcium in  cup of  almonds.  Broccoli, cooked. There are 60 mg of calcium in 1 cup of cooked broccoli. This information is not intended to replace advice given to you by your health care provider. Make sure you discuss any questions you have with your health care provider. Document Released: 10/16/2001 Document Revised: 10/02/2015 Document Reviewed: 07/27/2013  2017 Elsevier  We will send in your prescription to your pharmacy today

## 2016-05-05 ENCOUNTER — Telehealth: Payer: Self-pay | Admitting: Family Medicine

## 2016-05-06 NOTE — Telephone Encounter (Signed)
Yes thanks 

## 2016-05-06 NOTE — Telephone Encounter (Signed)
It is it okay to refill? Please advise

## 2016-05-07 ENCOUNTER — Other Ambulatory Visit: Payer: Self-pay | Admitting: Emergency Medicine

## 2016-05-07 MED ORDER — PRAMIPEXOLE DIHYDROCHLORIDE 0.5 MG PO TABS: 0.5000 mg | ORAL_TABLET | Freq: Every day | ORAL | 1 refills | Status: DC

## 2016-05-07 NOTE — Telephone Encounter (Addendum)
Pt states [facility]/ [address]  Never received the Rx pramipexole (MIRAPEX) 0.5 MG tablet  Also needs the  temazepam (RESTORIL) 30 MG capsule  Both asap  [facility]

## 2016-05-13 ENCOUNTER — Telehealth: Payer: Self-pay | Admitting: Family Medicine

## 2016-05-13 NOTE — Telephone Encounter (Signed)
° ° ° ° °  Pt said she think she has a UTI and is asking if her husband can come pick up the cup and bring it back. Would like a call back

## 2016-05-13 NOTE — Telephone Encounter (Signed)
Needs office visit.

## 2016-05-14 DIAGNOSIS — M1712 Unilateral primary osteoarthritis, left knee: Secondary | ICD-10-CM | POA: Diagnosis not present

## 2016-05-14 NOTE — Telephone Encounter (Signed)
pt is aware that she will need to come in for an appt

## 2016-05-24 IMAGING — MR MR FOOT*L* WO/W CM
4 of 9 series · 19 of 40 positions shown · IV contrast (multihance)
Comparison: Radiographs 08/02/2014.

CLINICAL DATA: Diabetes and hammer toe. Diabetic foot ulcer is.
Abnormal radiographs with possible areas of osteolysis.

EXAM:
MRI OF THE LEFT FOREFOOT WITHOUT AND WITH CONTRAST
TECHNIQUE: Multiplanar, multisequence MR imaging was performed both before and
after administration of intravenous contrast.
CONTRAST:  20mL MULTIHANCE GADOBENATE DIMEGLUMINE 529 MG/ML IV SOLN

[Series 3: T1 · coronal · 4.0mm · 0.29mm/px · 7 of 40 slices shown (1 of 2)]
[im 1/40]
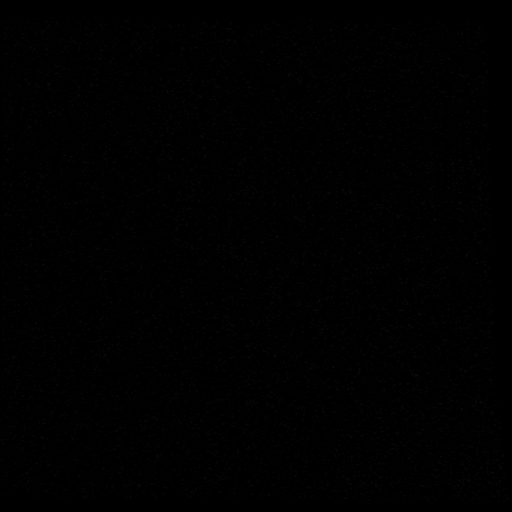
[im 7/40]
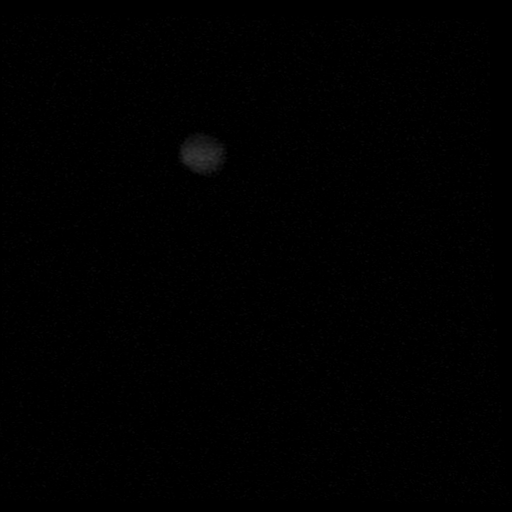
[im 14/40]
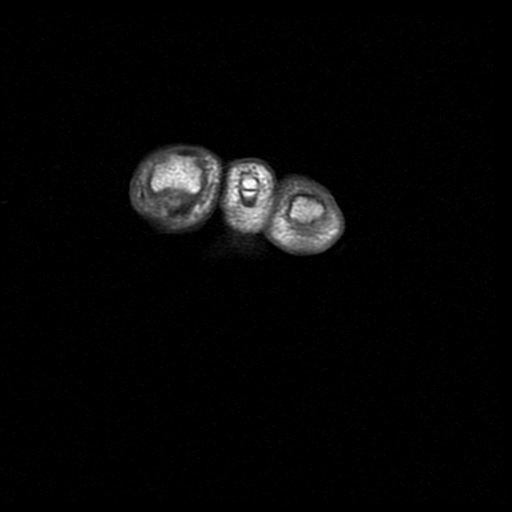
[im 20/40]
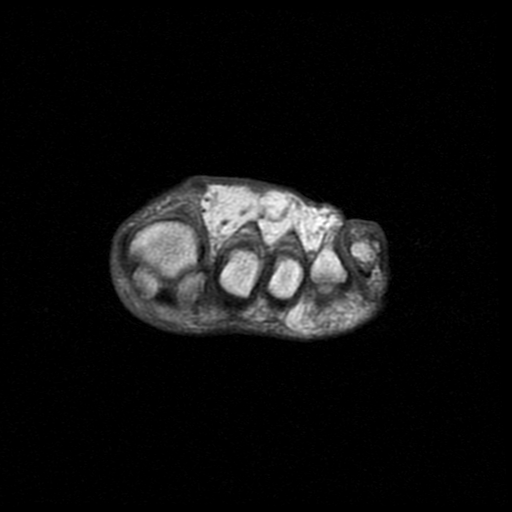
[im 27/40]
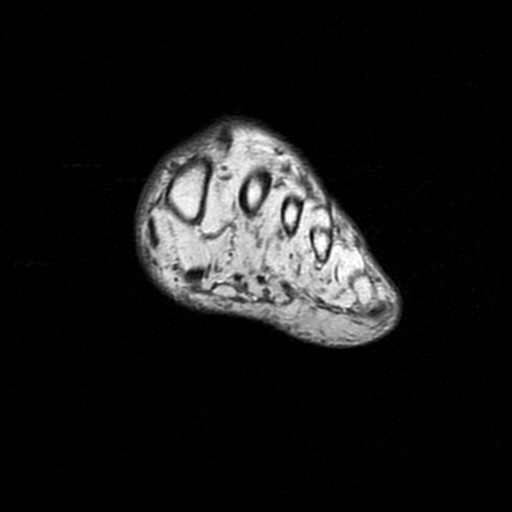
[im 33/40]
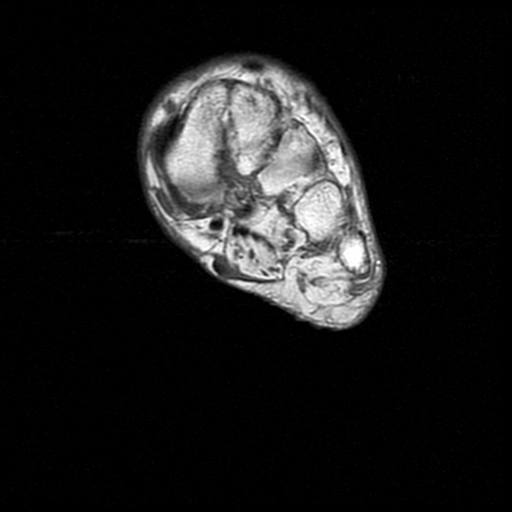
[im 40/40]
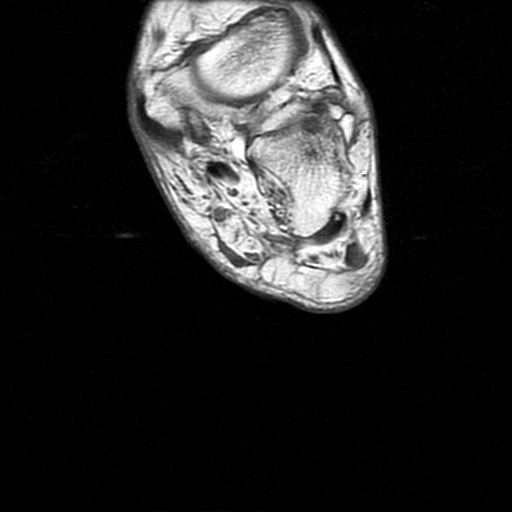

[Series 6: T1 · axial · 4.0mm · 0.29mm/px · z∈[-88,-7]mm · 3 of 21 slices shown (2 of 2)]
[im 1/21]
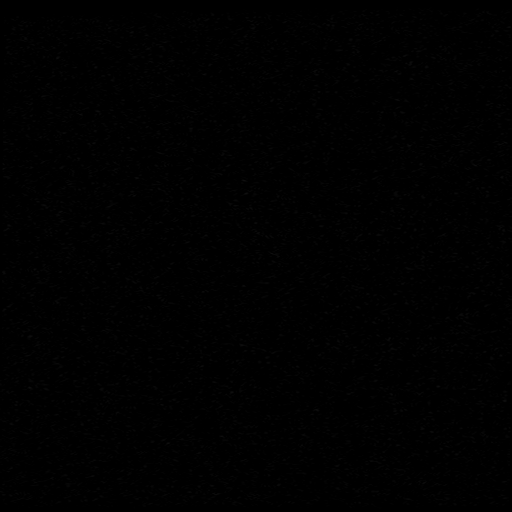
[im 11/21]
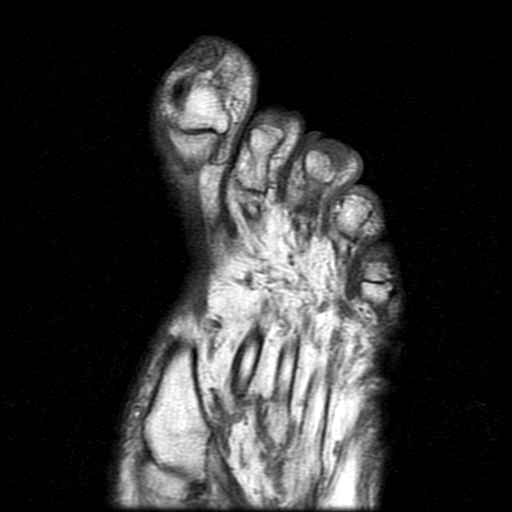
[im 21/21]
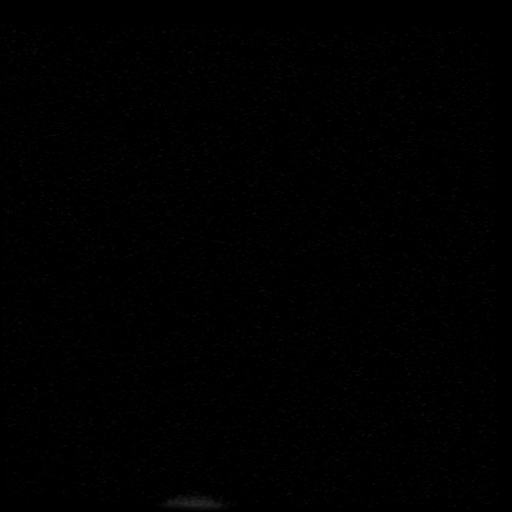

[Series 9: T1 fat-sat post-contrast · coronal · 4.0mm · 0.29mm/px · 6 of 39 slices shown]
[im 1/39]
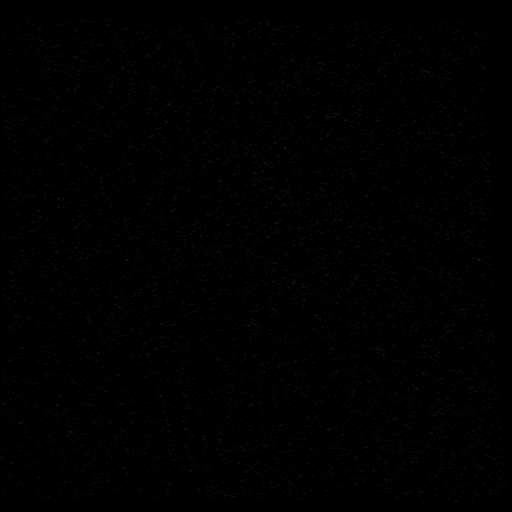
[im 8/39]
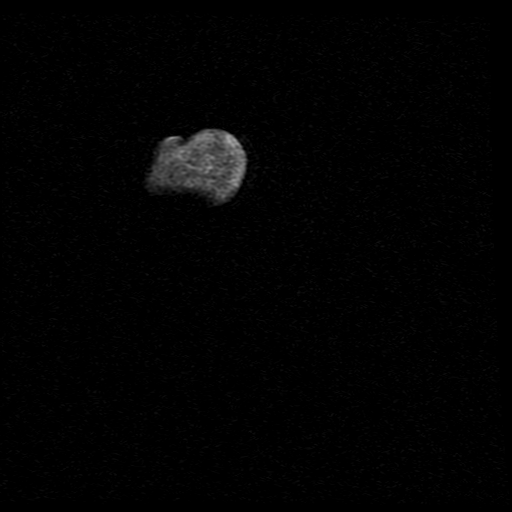
[im 16/39]
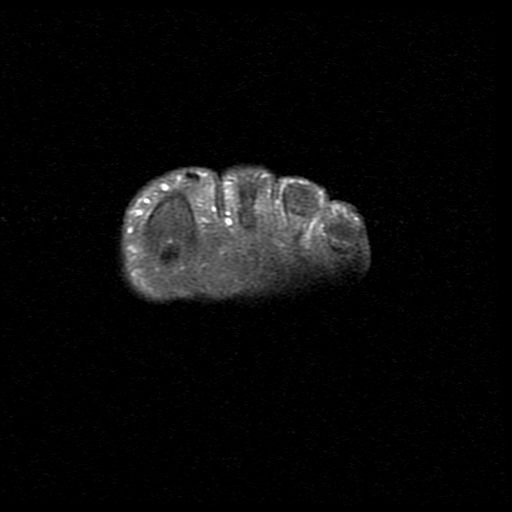
[im 23/39]
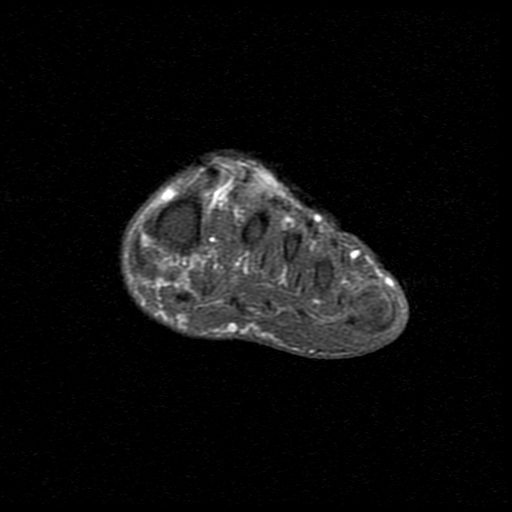
[im 31/39]
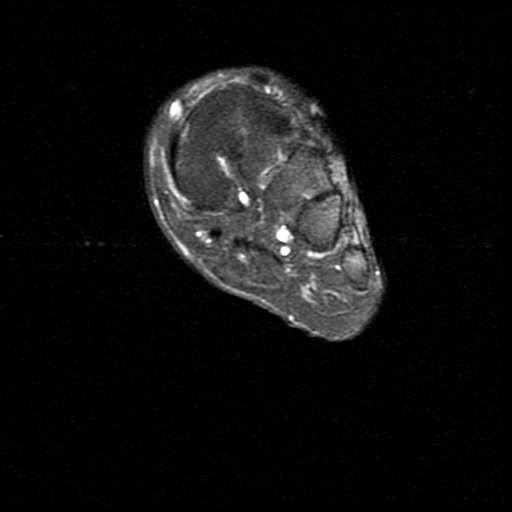
[im 39/39]
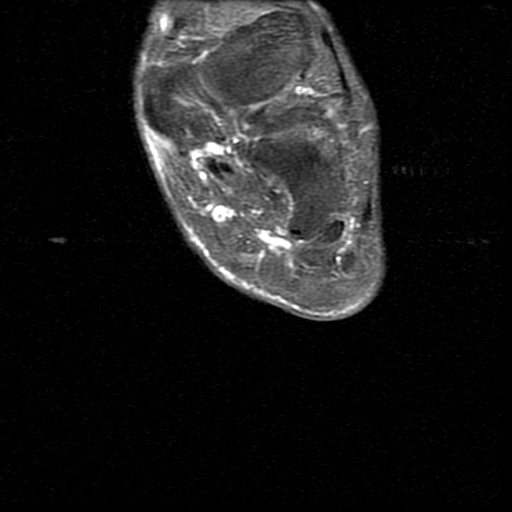

[Series 10: T1 post-contrast · axial · 4.0mm · 0.29mm/px · z∈[-88,-7]mm · 3 of 21 slices shown]
[im 1/21]
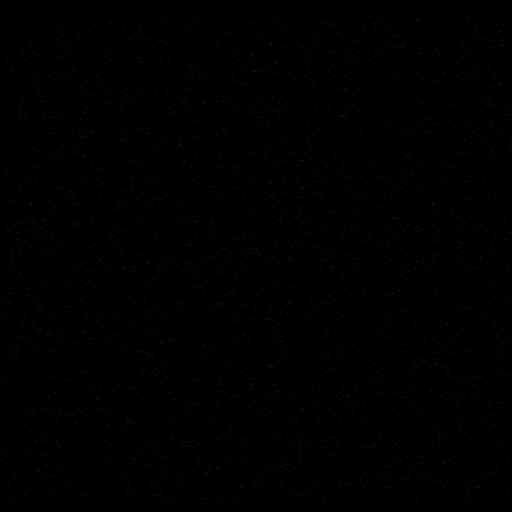
[im 11/21]
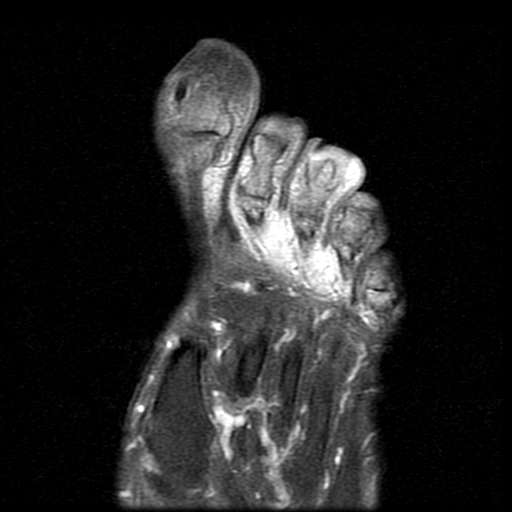
[im 21/21]
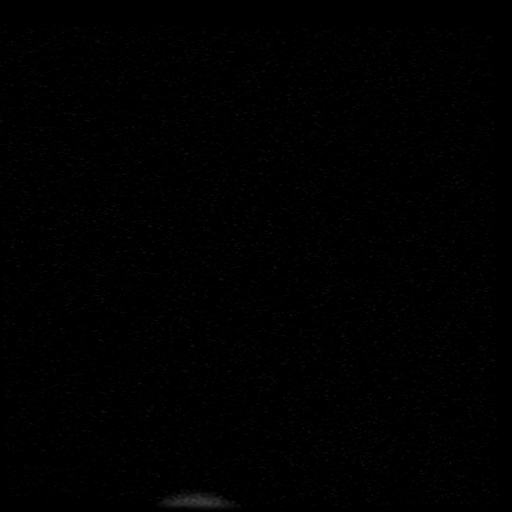

[19 of 40 positions shown; findings below may reference images not displayed]

FINDINGS: Atrophy of the plantar foot musculature, compatible with diabetic
myopathy. Corresponding the MRI with prior foot radiographs, the
areas of osteolysis represent osteopenia.

There is osteomyelitis of the terminal phalanx of the small toe with
bone marrow edema and effacement of normal fatty marrow in the
terminal phalanx (image 12 series 6). This shows expected post
gadolinium enhancement. There is no osteomyelitis of the great toe.
Soft tissue edema and enhancement is present in the plantar aspect
of the great toe around an area of ulceration. Tarsometatarsal joint
osteoarthritis is present. The exostosis extending off the navicular
bone is not visible by MRI.

Flexor and extensor tendons appear within normal limits.
IMPRESSION: Osteomyelitis of the terminal phalanx of the third toe. Ulcer on the
plantar great toe without osteomyelitis.

## 2016-05-27 ENCOUNTER — Ambulatory Visit: Payer: Medicare Other | Admitting: Pulmonary Disease

## 2016-06-06 ENCOUNTER — Other Ambulatory Visit: Payer: Self-pay | Admitting: Family Medicine

## 2016-06-08 ENCOUNTER — Telehealth: Payer: Self-pay | Admitting: Family Medicine

## 2016-06-08 NOTE — Telephone Encounter (Signed)
Pt states [facility] [address]  does not have her refill on temazepam

## 2016-06-08 NOTE — Telephone Encounter (Signed)
Prescription was faxed 06/07/2016. I am refaxing now.

## 2016-06-08 NOTE — Telephone Encounter (Signed)
[facility] not [facility] this rx  temazepam (RESTORIL) 30 MG capsule  Has not been  Received.  Her husband is on his way  here to pick up a hard copy to take to the pharmacy.  She say she cannot go another night without her sleeping pills.

## 2016-06-08 NOTE — Telephone Encounter (Signed)
Prescription was faxed twice. Husband was provided prescription and both fax confirmations received from [facility]

## 2016-06-18 ENCOUNTER — Ambulatory Visit: Payer: Medicare Other | Admitting: Family Medicine

## 2016-06-26 ENCOUNTER — Telehealth: Payer: Self-pay | Admitting: Emergency Medicine

## 2016-06-26 NOTE — Telephone Encounter (Signed)
Followed by Dr [place] Bells with bronchiectasis Pt reports URI exposed to husband's illness, evolving purulent sputum  Does not tolerate Levaquin. Will start Augmentin 875mg  bid x 7 days  Instructed her to call us if worse in any way.   Baltazar Apo, MD, PhD 06/26/2016, 11:01 AM [facility] [phone] or if no answer [phone]

## 2016-06-29 ENCOUNTER — Other Ambulatory Visit: Payer: Self-pay | Admitting: Obstetrics & Gynecology

## 2016-06-30 ENCOUNTER — Other Ambulatory Visit: Payer: Self-pay | Admitting: Obstetrics & Gynecology

## 2016-06-30 NOTE — Telephone Encounter (Signed)
Spoke with patient. She found the prescription that Dr. Sabra Heck wrote her on 03/02/16. Patient's husband, Clair Gulling, is going to take it to the pharmacy to be filled.

## 2016-06-30 NOTE — Telephone Encounter (Signed)
Patient is calling to check on the request for estrace. She is using [facility]. She states she is completely out and has an appointment for 07/02/16.

## 2016-07-02 ENCOUNTER — Ambulatory Visit (INDEPENDENT_AMBULATORY_CARE_PROVIDER_SITE_OTHER): Payer: Medicare Other | Admitting: Obstetrics & Gynecology

## 2016-07-02 ENCOUNTER — Encounter: Payer: Self-pay | Admitting: Obstetrics & Gynecology

## 2016-07-02 VITALS — BP 154/70 | HR 110 | Resp 16 | Ht 72.0 in | Wt 256.0 lb

## 2016-07-02 DIAGNOSIS — B373 Candidiasis of vulva and vagina: Secondary | ICD-10-CM | POA: Diagnosis not present

## 2016-07-02 DIAGNOSIS — N952 Postmenopausal atrophic vaginitis: Secondary | ICD-10-CM | POA: Diagnosis not present

## 2016-07-02 DIAGNOSIS — B3731 Acute candidiasis of vulva and vagina: Secondary | ICD-10-CM

## 2016-07-02 MED ORDER — TERCONAZOLE 0.4 % VA CREA: 1.0000 | TOPICAL_CREAM | Freq: Every day | VAGINAL | 0 refills | Status: DC

## 2016-07-02 MED ORDER — FLUCONAZOLE 150 MG PO TABS: ORAL_TABLET | ORAL | 1 refills | Status: DC

## 2016-07-02 NOTE — Progress Notes (Signed)
GYNECOLOGY  VISIT   HPI: 76 y.o. G0P0 Married Caucasian female here for estring change.  Pt cannot remove this on her own.  Reports she's had severe bronchitis this winter and has been off and on antibiotics.  Now with vaginal discharge and itching.  Denies vaginal bleeding.  Took one dilfucan she had on hand at home.  Denies dysuria or fever.  Having some rectal irritation as well due to diarrhea that often occurs with antibiotic use.    GYNECOLOGIC HISTORY: Patient's last menstrual period was 05/10/1992. Menopausal hormone therapy: estring only  Patient Active Problem List   Diagnosis Date Noted  . Arthritis of midfoot 03/03/2016  . Hammertoes of both feet 03/03/2016  . Aortic atherosclerosis ([place]) 02/16/2016  . Solitary pulmonary nodule 01/08/2016  . 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  . Bronchiectasis without acute exacerbation ([place]) 10/18/2015  . Acute diverticulitis 10/05/2015  . IBS (irritable bowel syndrome) 07/17/2014  . Diabetic ulcer of left foot associated with type 2 diabetes mellitus ([place]) 03/13/2014  . CKD (chronic kidney disease), stage II 01/22/2014  . Depression 01/03/2014  . Posterior tibial tendon dysfunction 01/03/2014  . Osteoarthritis, knee 01/03/2014  . Glaucoma 01/03/2014  . Essential hypertension, benign 01/03/2014  . Obesity (BMI 30-39.9) 06/15/2013  . Foot tendinitis 07/20/2010  . INSOMNIA, CHRONIC 07/25/2009  . Fatty liver 03/18/2009  . GERD 12/25/2007  . Hyperlipidemia 07/26/2007  . ANEMIA, B12 DEFICIENCY 12/29/2006  . Diabetes mellitus type II, controlled ([place]) 12/28/2006  . RESTLESS LEG SYNDROME, MILD 12/28/2006  . NEUROPATHY, IDIOPATHIC PERIPHERAL NEC 12/28/2006  . OSTEOPOROSIS 12/12/2006  . BREAST CANCER, HX OF 12/12/2006    Past Medical History:  Diagnosis Date  . Arthritis    right knee;injections every 32months   . Asthma   . Cancer (HCC)    HX BREAST CANCER  . COMPRESSION FRACTURE, LUMBAR VERTEBRAE 08/21/2008   Qualifier: Diagnosis of  By:  Arnoldo Morale MD, Balinda Quails   . Depression   . Diabetes mellitus    takes Amaryl and Januvia daily  . Difficulty sleeping   . Diverticulitis   . Early cataracts, bilateral   . Eczema   . Endometriosis   . Gastritis   . Glaucoma   . Hammer toe   . Headache(784.0)    Migraines  . Hypertension    takes Atenolol daily  . IBS (irritable bowel syndrome)   . Joint pain   . Kidney stone   . Neuropathy ([place])   . Open wound of second toe of left foot   . Osteomyelitis ([place])    left 2nd toe  . Osteopenia   . PONV (postoperative nausea and vomiting)   . Posterior tibial tendon dysfunction    left foot  . Scoliosis   . 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 Vitamin D deficiency    takes Vit d daily    Past Surgical History:  Procedure Laterality Date  . ADENOIDECTOMY     at age 77  . biopsy on back     benign  . CATARACT EXTRACTION    . CHOLECYSTECTOMY  06/2008  . COLONOSCOPY  11/02/05  . DILATION AND CURETTAGE OF UTERUS    . ENDOMETRIAL BIOPSY  06/22/2011   benign polyp, no hyperplasia  . excision on breast     internal infected suture from breast surgery  . excision removed from neck     infected lymph node  . FOOT SURGERY     left foot-cleaning out  areas on toes at the wound center-having MRI to determine if osteomyelitis  . HYSTEROSCOPY  06/22/11   PMB submucosal myoma  . JOINT REPLACEMENT     left total shoulder  . LAPAROSCOPIC OOPHORECTOMY Right 12/2004   absent LSO  . LAPAROTOMY    . MASTECTOMY Bilateral 1994 right, 1995 left  . SHOULDER HEMI-ARTHROPLASTY  01/01/2012   Procedure: SHOULDER HEMI-ARTHROPLASTY;  Surgeon: Roseanne Kaufman, MD;  Location: [facility];  Service: Orthopedics;  Laterality: Left;  Left Shoulder Hemi Arthroplasty with Repair and Reconstruction as Necessary   . THYROIDECTOMY     70yrs ago. follows endocrine  . TONSILLECTOMY    . TOTAL KNEE ARTHROPLASTY Right 12/30/2014   Procedure: RIGHT TOTAL KNEE ARTHROPLASTY;  Surgeon:  Gaynelle Arabian, MD;  Location: [facility];  Service: Orthopedics;  Laterality: Right;    MEDS:  Reviewed in EPIC and UTD  ALLERGIES: Cephalexin; Erythromycin ethylsuccinate; Levaquin [levofloxacin in d5w]; Nitrofurantoin; and Percocet [oxycodone-acetaminophen]  Family History  Problem Relation Age of Onset  . Arthritis Mother   . COPD Father   . Heart disease Father     MI 48  . Hypertension Father   . Hyperlipidemia Father   . Colon cancer Neg Hx   . Esophageal cancer Neg Hx   . Rectal cancer Neg Hx   . Stomach cancer Neg Hx     SH:  Married, non smoker  Review of Systems  Respiratory: Negative for cough and wheezing.   Cardiovascular: Negative for chest pain and palpitations.  Neurological: Positive for weakness.    PHYSICAL EXAMINATION:    BP (!) 154/70 (BP Location: Right Arm, Patient Position: Sitting, Cuff Size: Large)   Pulse (!) 110   Resp 16   Ht 6' (1.829 m)   Wt 256 lb (116.1 kg)   LMP 05/10/1992   BMI 34.72 kg/m     General appearance: alert, cooperative and appears stated age Abdomen: soft, non-tender; bowel sounds normal; no masses,  no organomegaly  Pelvic: External genitalia:  no lesions              Urethra:  normal appearing urethra with no masses, tenderness or lesions              Bartholins and Skenes: normal                 Vagina: normal appearing vagina with significant white discharge, Estring removed and replaced              Cervix: no lesions              Bimanual Exam:  Uterus:  normal size, contour, position, consistency, mobility, non-tender              Adnexa: no mass, fullness, tenderness              Anus:  no lesions  Chaperone was present for exam.  Assessment: Vaginal atrophy treated with Estring Yeast vaginitis from recent antibiotic use, likely  Plan: terazol cream to be used nightly x 7 nights Dfilucan 150mg  po x 1, repeat 72 hours.  #2/RF Recheck 3 months

## 2016-07-05 ENCOUNTER — Ambulatory Visit (INDEPENDENT_AMBULATORY_CARE_PROVIDER_SITE_OTHER): Payer: Medicare Other | Admitting: Gastroenterology

## 2016-07-05 ENCOUNTER — Encounter: Payer: Self-pay | Admitting: Gastroenterology

## 2016-07-05 VITALS — BP 116/60 | HR 88 | Ht 71.0 in | Wt 255.5 lb

## 2016-07-05 DIAGNOSIS — K580 Irritable bowel syndrome with diarrhea: Secondary | ICD-10-CM

## 2016-07-05 MED ORDER — CHOLESTYRAMINE 4 G PO PACK: 4.0000 g | PACK | Freq: Three times a day (TID) | ORAL | 6 refills | Status: DC

## 2016-07-05 NOTE — Progress Notes (Signed)
[name]    HO:[id number]    [birth year]  Primary Care Physician:Stephen Yong Channel, MD  Referring Physician: Marin Olp, MD [place], [place] [address]  Chief complaint: Diarrhea  HPI: 76 year old female with history of irritable bowel syndrome predominant diarrhea is here for follow-up visit. She was last seen in office 04/29/2016. Patient tried a lactose-free diet but was not completely adherent to it, didn't notice much difference. She took cholestyramine with lunch and has noticed improvement on most days that she takes it but she feels its inconvenient to take it and does skips some days. Denies any abdominal pain, nausea, vomiting or blood per rectum. She continues to have increased bowel frequency, mostly in the morning with multiple bowel movements postprandial.    Pertinent GI history: She has had 3 prior colonoscopies. Her first one was in 2000 and her most recent one was in February 2015 which showed mild diverticulosis and hemorrhoids.  An upper abdominal ultrasound in August 2010 showed her to be status post cholecystectomy , liver was fatty.. Her common bile duct was 5 mm and splenic size was 12 cm. She has a history of breast cancer. She is status post bilateral mastectomy in 1994 and 1995   Outpatient Encounter Prescriptions as of 07/05/2016  Medication Sig  . albuterol (PROVENTIL HFA;VENTOLIN HFA) 108 (90 Base) MCG/ACT inhaler Inhale 2 puffs into the lungs every 6 (six) hours as needed for wheezing or shortness of breath.  [name] amoxicillin-clavulanate (AUGMENTIN) 875-125 MG tablet Take 1 tablet by mouth 2 (two) times daily.  [name] atenolol (TENORMIN) 25 MG tablet Take 1 tablet (25 mg total) by mouth daily.  . betamethasone dipropionate (DIPROLENE) 0.05 % cream Apply 1 application topically 2 (two) times daily as needed (dermatitis).  . budesonide-formoterol (SYMBICORT) 160-4.5 MCG/ACT inhaler Inhale 2 puffs into the lungs 2 (two) times daily.   . cholestyramine (QUESTRAN) 4 g packet Take 1 packet (4 g total) by mouth 3 (three) times daily with meals.  . cyanocobalamin (,VITAMIN B-12,) 1000 MCG/ML injection Inject 1 mL (1,000 mcg total) into the muscle every 30 (thirty) days.  . dorzolamide-timolol (COSOPT) 22.3-6.8 MG/ML ophthalmic solution Place 1 drop into both eyes 2 (two) times daily.   [name] estradiol (ESTRING) 2 MG vaginal ring Place 2 mg vaginally every 3 (three) months. follow package directions  . Flaxseed, Linseed, (FLAX SEED OIL PO) Take 1 tablet by mouth daily.  [name] FLUoxetine (PROZAC) 40 MG capsule Take 1 capsule (40 mg total) by mouth daily.  [name] glimepiride (AMARYL) 4 MG tablet TAKE ONE TABLET BY MOUTH ONCE DAILY WITH BREAKFAST  . glucose blood (FREESTYLE LITE) test strip USE TO CHECK BLOOD SUGAR DAILY AND AS NEEDED  . Isometheptene-Caffeine-APAP 65-20-325 MG TABS Take 1-2 tablets as needed for migraine twice a day maximum  . liraglutide (VICTOZA) 18 MG/3ML SOPN INJECT 1.8MG  INTO THE SKIN DAILY.  [name] lisinopril (PRINIVIL,ZESTRIL) 10 MG tablet Take 1 tablet (10 mg total) by mouth daily.  [name] LUMIGAN 0.01 % SOLN Place 1 drop into both eyes at bedtime.   . Methylcellulose, Laxative, (CITRUCEL) 500 MG TABS Take 2 tablets by mouth daily after supper.   . Omega-3 Fatty Acids (FISH OIL PO) Take 2 tablets by mouth daily.  . pramipexole (MIRAPEX) 0.5 MG tablet Take 1 tablet (0.5 mg total) by mouth at bedtime.  [name] Respiratory Therapy Supplies (FLUTTER) DEVI Use as directed  . temazepam (RESTORIL) 30 MG capsule TAKE  ONE CAPSULE BY MOUTH AT BEDTIME AS NEEDED FOR SLEEP  . terconazole (TERAZOL 7) 0.4 % vaginal cream Place 1 applicator vaginally at bedtime.  . [DISCONTINUED] fluconazole (DIFLUCAN) 150 MG tablet Take 1 tab, repeat 72 hours   No facility-administered encounter medications on file as of 07/05/2016.     Allergies as of 07/05/2016 - Review Complete 07/05/2016  Allergen Reaction Noted  . Cephalexin Diarrhea 10/14/2006  .  Erythromycin ethylsuccinate Hives and Diarrhea 10/14/2006  . Levaquin [levofloxacin in d5w] Other (See Comments) 12/18/2014  . Nitrofurantoin Other (See Comments) 10/14/2006  . Percocet [oxycodone-acetaminophen] Itching 12/31/2011    Past Medical History:  Diagnosis Date  . Arthritis    right knee;injections every 72months   . Asthma   . Cancer (HCC)    HX BREAST CANCER  . COMPRESSION FRACTURE, LUMBAR VERTEBRAE 08/21/2008   Qualifier: Diagnosis of  By: Arnoldo Morale MD, Balinda Quails   . Depression   . Diabetes mellitus    takes Amaryl and Januvia daily  . Difficulty sleeping   . Diverticulitis   . Early cataracts, bilateral   . Eczema   . Endometriosis   . Gastritis   . Glaucoma   . Hammer toe   . Headache(784.0)    Migraines  . Hypertension    takes Atenolol daily  . IBS (irritable bowel syndrome)   . Joint pain   . Kidney stone   . Neuropathy ([place])   . Open wound of second toe of left foot   . Osteomyelitis ([place])    left 2nd toe  . Osteopenia   . PONV (postoperative nausea and vomiting)   . Posterior tibial tendon dysfunction    left foot  . Scoliosis   . 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 Vitamin D deficiency    takes Vit d daily    Past Surgical History:  Procedure Laterality Date  . ADENOIDECTOMY     at age 51  . biopsy on back     benign  . CATARACT EXTRACTION    . CHOLECYSTECTOMY  06/2008  . COLONOSCOPY  11/02/05  . DILATION AND CURETTAGE OF UTERUS    . ENDOMETRIAL BIOPSY  06/22/2011   benign polyp, no hyperplasia  . excision on breast     internal infected suture from breast surgery  . excision removed from neck     infected lymph node  . FOOT SURGERY     left foot-cleaning out areas on toes at the wound center-having MRI to determine if osteomyelitis  . HYSTEROSCOPY  06/22/11   PMB submucosal myoma  . JOINT REPLACEMENT     left total shoulder  . LAPAROSCOPIC OOPHORECTOMY Right 12/2004   absent LSO  . LAPAROTOMY      . MASTECTOMY Bilateral 1994 right, 1995 left  . SHOULDER HEMI-ARTHROPLASTY  01/01/2012   Procedure: SHOULDER HEMI-ARTHROPLASTY;  Surgeon: Roseanne Kaufman, MD;  Location: [facility];  Service: Orthopedics;  Laterality: Left;  Left Shoulder Hemi Arthroplasty with Repair and Reconstruction as Necessary   . THYROIDECTOMY     71yrs ago. follows endocrine  . TONSILLECTOMY    . TOTAL KNEE ARTHROPLASTY Right 12/30/2014   Procedure: RIGHT TOTAL KNEE ARTHROPLASTY;  Surgeon: Gaynelle Arabian, MD;  Location: [facility];  Service: Orthopedics;  Laterality: Right;    Family History  Problem Relation Age of Onset  . Arthritis Mother   . COPD Father   . Heart disease Father     MI 22  .  Hypertension Father   . Hyperlipidemia Father   . Colon cancer Neg Hx   . Esophageal cancer Neg Hx   . Rectal cancer Neg Hx   . Stomach cancer Neg Hx     Social History   Social History  . Marital status: Married    Spouse name: N/A  . Number of children: N/A  . Years of education: N/A   Occupational History  . Not on file.   Social History Main Topics  . Smoking status: Never Smoker  . Smokeless tobacco: Never Used  . Alcohol use No  . Drug use: No  . Sexual activity: Not on file   Other Topics Concern  . Not on file   Social History Narrative   Married 39 years in 2015. 1 adopted son. 1 grandkid (61 yo grandson)      Retired from Development worker, international aid at [employer]: fashion, tv shopping, sewing, decorate      Review of systems: Review of Systems  Constitutional: Negative for fever and chills.  HENT: Negative.   Eyes: Negative for blurred vision.  Respiratory: Negative for cough, shortness of breath and wheezing.   Cardiovascular: Negative for chest pain and palpitations.  Gastrointestinal: as per HPI Genitourinary: Negative for dysuria, urgency, frequency and hematuria.  Musculoskeletal: Positive for myalgias, back pain and joint pain.  Skin: Negative for itching and rash.  Neurological: Negative for  dizziness, tremors, focal weakness, seizures and loss of consciousness.  Endo/Heme/Allergies: Positive for seasonal allergies.  Psychiatric/Behavioral: Negative for depression, suicidal ideas and hallucinations.  All other systems reviewed and are negative.   Physical Exam: Vitals:   07/05/16 1437  BP: 116/60  Pulse: 88   Body mass index is 35.64 kg/m. Gen:      No acute distress, wheel chair HEENT:  EOMI, sclera anicteric Neck:     No masses; no thyromegaly Abd:      + bowel sounds; soft, non-tender; no palpable masses, no distension Ext:    No edema; adequate peripheral perfusion Skin:      Warm and dry; no rash Neuro: alert and oriented x 3 Psych: normal mood and affect  Data Reviewed:  Reviewed labs, radiology imaging, old records and pertinent past GI work up   Assessment and Plan/Recommendations:  76 year old female with history of irritable bowel syndrome predominant diarrhea Advised patient to continue cholestyramine 3 times daily with meals Benefiber 1 tablespoon 3 times a day with meals Avoid soda, high fructose corn syrup and artificial sweeteners Return in 3 months or sooner if needed 25 minutes was spent face-to-face with the patient. Greater than 50% of the time used for counseling as well as treatment plan and follow-up. She had multiple questions which were answered to her satisfaction  K. Denzil Magnuson , MD [phone] Mon-Fri 8a-5p [phone] after 5p, weekends, holidays  CC: Marin Olp, MD

## 2016-07-05 NOTE — Patient Instructions (Signed)
Take IBGard 1-2 capsules three times a day  Use Benefiber 1 tablespoon three times a a day with meals  We have sent the following medications to your pharmacy for you to pick up at your convenience:  [name]

## 2016-07-07 ENCOUNTER — Ambulatory Visit: Payer: Medicare Other | Admitting: Gastroenterology

## 2016-07-23 ENCOUNTER — Ambulatory Visit (INDEPENDENT_AMBULATORY_CARE_PROVIDER_SITE_OTHER): Payer: Medicare Other | Admitting: Family Medicine

## 2016-07-23 ENCOUNTER — Encounter: Payer: Self-pay | Admitting: Family Medicine

## 2016-07-23 DIAGNOSIS — E11621 Type 2 diabetes mellitus with foot ulcer: Secondary | ICD-10-CM

## 2016-07-23 DIAGNOSIS — E119 Type 2 diabetes mellitus without complications: Secondary | ICD-10-CM | POA: Diagnosis not present

## 2016-07-23 DIAGNOSIS — L97521 Non-pressure chronic ulcer of other part of left foot limited to breakdown of skin: Secondary | ICD-10-CM | POA: Diagnosis not present

## 2016-07-23 DIAGNOSIS — F331 Major depressive disorder, recurrent, moderate: Secondary | ICD-10-CM | POA: Diagnosis not present

## 2016-07-23 DIAGNOSIS — E785 Hyperlipidemia, unspecified: Secondary | ICD-10-CM

## 2016-07-23 LAB — POCT GLYCOSYLATED HEMOGLOBIN (HGB A1C): HEMOGLOBIN A1C: 6.3

## 2016-07-23 MED ORDER — TEMAZEPAM 30 MG PO CAPS: 30.0000 mg | ORAL_CAPSULE | Freq: Every evening | ORAL | 5 refills | Status: DC | PRN

## 2016-07-23 NOTE — Patient Instructions (Addendum)
we opted to change from prozac 40mg  to lexapro 20mg . Follow up 6 weeks. Let us know if any thoughts of hurting yourself.   Lab Results  Component Value Date   HGBA1C 6.3 07/23/2016  Getting out, getting moving will help with diabetes curve as well as with depression

## 2016-07-23 NOTE — Assessment & Plan Note (Signed)
S: well controlled. On victoza and amaryl 4mg  CBGs- usually 100-120. Always under 150 fasting. No low blood sugars Exercise and diet- exercise has been limited due to arthritic pains Lab Results  Component Value Date   HGBA1C 6.1 02/16/2016   HGBA1C 6.0 10/13/2015   HGBA1C 6.0 10/17/2014   A/P: update poc a1c today- suspect controlled

## 2016-07-23 NOTE — Assessment & Plan Note (Signed)
S: has been indoors due to flu season for most part. stresssors- Husband a fib, son seizure issues, grandson not doing well . Feels like depression has singificantly worsened. PHQ9 of 14. No suicidal thoughts- fleeting thoughts of "lord take me home" but would never injure herself A/P: we opted to change from prozac 40mg  to lexapro 20mg . Follow up 6 weeks. Let us know if any thoughts of hurting yourself.  Declines counseling. Advised to try to get out more and do more of the activitie sshe ejjoys

## 2016-07-23 NOTE — Progress Notes (Signed)
Subjective:  [name] is a 76 y.o. year old very pleasant female patient who presents for/with See problem oriented charting ROS- No chest pain or shortness of breath but has minimal exertion. No headache or blurry vision.    Past Medical History-  Patient Active Problem List   Diagnosis Date Noted  . 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    Priority: High  . Bronchiectasis without acute exacerbation ([place]) 10/18/2015    Priority: High  . Diabetic ulcer of left foot associated with type 2 diabetes mellitus ([place]) 03/13/2014    Priority: High  . Diabetes mellitus type II, controlled ([place]) 12/28/2006    Priority: High  . Aortic atherosclerosis ([place]) 02/16/2016    Priority: Medium  . Solitary pulmonary nodule 01/08/2016    Priority: Medium  . IBS (irritable bowel syndrome) 07/17/2014    Priority: Medium  . Depression 01/03/2014    Priority: Medium  . Essential hypertension, benign 01/03/2014    Priority: Medium  . Fatty liver 03/18/2009    Priority: Medium  . Hyperlipidemia 07/26/2007    Priority: Medium  . ANEMIA, B12 DEFICIENCY 12/29/2006    Priority: Medium  . RESTLESS LEG SYNDROME, MILD 12/28/2006    Priority: Medium  . OSTEOPOROSIS 12/12/2006    Priority: Medium  . Acute diverticulitis 10/05/2015    Priority: Low  . CKD (chronic kidney disease), stage II 01/22/2014    Priority: Low  . Posterior tibial tendon dysfunction 01/03/2014    Priority: Low  . Osteoarthritis, knee 01/03/2014    Priority: Low  . Glaucoma 01/03/2014    Priority: Low  . Obesity (BMI 30-39.9) 06/15/2013    Priority: Low  . Foot tendinitis 07/20/2010    Priority: Low  . INSOMNIA, CHRONIC 07/25/2009    Priority: Low  . GERD 12/25/2007    Priority: Low  . NEUROPATHY, IDIOPATHIC PERIPHERAL NEC 12/28/2006    Priority: Low  . BREAST CANCER, HX OF 12/12/2006    Priority: Low  . Arthritis of midfoot 03/03/2016  . Hammertoes of both feet 03/03/2016    Medications- reviewed and updated Current  Outpatient Prescriptions  Medication Sig Dispense Refill  . albuterol (PROVENTIL HFA;VENTOLIN HFA) 108 (90 Base) MCG/ACT inhaler Inhale 2 puffs into the lungs every 6 (six) hours as needed for wheezing or shortness of breath. 1 Inhaler 2  . atenolol (TENORMIN) 25 MG tablet Take 1 tablet (25 mg total) by mouth daily. 90 tablet 3  . betamethasone dipropionate (DIPROLENE) 0.05 % cream Apply 1 application topically 2 (two) times daily as needed (dermatitis). 30 g 1  . cholestyramine (QUESTRAN) 4 g packet Take 1 packet (4 g total) by mouth 3 (three) times daily with meals. 60 each 3  . cholestyramine (QUESTRAN) 4 g packet Take 1 packet (4 g total) by mouth 3 (three) times daily with meals. 90 each 6  . cyanocobalamin (,VITAMIN B-12,) 1000 MCG/ML injection Inject 1 mL (1,000 mcg total) into the muscle every 30 (thirty) days. 1 mL 11  . dorzolamide-timolol (COSOPT) 22.3-6.8 MG/ML ophthalmic solution Place 1 drop into both eyes 2 (two) times daily.     [name] estradiol (ESTRING) 2 MG vaginal ring Place 2 mg vaginally every 3 (three) months. follow package directions 1 each 4  . Flaxseed, Linseed, (FLAX SEED OIL PO) Take 1 tablet by mouth daily.    [name] glimepiride (AMARYL) 4 MG tablet TAKE ONE TABLET BY MOUTH ONCE DAILY WITH BREAKFAST 90 tablet 3  . glucose blood (FREESTYLE LITE) test strip USE TO CHECK BLOOD SUGAR  DAILY AND AS NEEDED 100 each 5  . Isometheptene-Caffeine-APAP 65-20-325 MG TABS Take 1-2 tablets as needed for migraine twice a day maximum 30 tablet 5  . liraglutide (VICTOZA) 18 MG/3ML SOPN INJECT 1.8MG  INTO THE SKIN DAILY. 9 pen 6  . lisinopril (PRINIVIL,ZESTRIL) 10 MG tablet Take 1 tablet (10 mg total) by mouth daily. 90 tablet 3  . LUMIGAN 0.01 % SOLN Place 1 drop into both eyes at bedtime.     . Methylcellulose, Laxative, (CITRUCEL) 500 MG TABS Take 2 tablets by mouth daily after supper.     . Omega-3 Fatty Acids (FISH OIL PO) Take 2 tablets by mouth daily.    . pramipexole (MIRAPEX) 0.5 MG  tablet Take 1 tablet (0.5 mg total) by mouth at bedtime. 90 tablet 1  . Respiratory Therapy Supplies (FLUTTER) DEVI Use as directed 1 each 0  . temazepam (RESTORIL) 30 MG capsule Take 1 capsule (30 mg total) by mouth at bedtime as needed. for sleep 30 capsule 5  . terconazole (TERAZOL 7) 0.4 % vaginal cream Place 1 applicator vaginally at bedtime. 45 g 0  . budesonide-formoterol (SYMBICORT) 160-4.5 MCG/ACT inhaler Inhale 2 puffs into the lungs 2 (two) times daily. 1 Inhaler 5  prozac 40mg  prior to visit No current facility-administered medications for this visit.     Objective: BP 106/60 (BP Location: Left Arm, Patient Position: Sitting, Cuff Size: Large)   Pulse 94   Temp 98.1 F (36.7 C) (Oral)   Ht 5\' 11"  (1.803 m)   Wt 246 lb (111.6 kg)   LMP 05/10/1992   SpO2 94%   BMI 34.31 kg/m  Gen: NAD, resting comfortably CV: RRR no murmurs rubs or gallops Lungs: CTAB no crackles, wheeze, rhonchi Ext: no edema, prior ulcerations on left foot on hammertoes have resolved. Covered by gel sleeve Skin: warm, dry Neuro: grossly normal, moves all extremities  Assessment/Plan:  Diabetes mellitus type II, controlled ([place]) S: well controlled. On victoza and amaryl 4mg  CBGs- usually 100-120. Always under 150 fasting. No low blood sugars Exercise and diet- exercise has been limited due to arthritic pains Lab Results  Component Value Date   HGBA1C 6.1 02/16/2016   HGBA1C 6.0 10/13/2015   HGBA1C 6.0 10/17/2014   A/P: update poc a1c today- suspect controlled  Depression S: has been indoors due to flu season for most part. stresssors- Husband a fib, son seizure issues, grandson not doing well . Feels like depression has singificantly worsened. PHQ9 of 14. No suicidal thoughts- fleeting thoughts of "lord take me home" but would never injure herself A/P: we opted to change from prozac 40mg  to lexapro 20mg . Follow up 6 weeks. Let us know if any thoughts of hurting yourself.  Declines counseling.  Advised to try to get out more and do more of the activitie sshe ejjoys  Hyperlipidemia S: due to stressors appetite has been low- has lost at least 10 lbs over last few months A/P: lipids controlled last visit without meds- will continue without as likely diet/exercise controlled   Diabetic ulcer of left foot associated with type 2 diabetes mellitus S: patient was seen by sports medicine and podiatry. She was advised to use some gel toe covers.  A/P: remarkably this has resolved her ulceration due to hammer toes. She plans to continue this to allow her to avoid ulceration. Will remove issue at this point  For upcoming knee replacement-Cant clear off activity level for cardiac for knee replacement (cannot do 4 mets) would consider stress testing or  cardiology referral  No Follow-up on file.  Orders Placed This Encounter  Procedures  . POCT glycosylated hemoglobin (Hb A1C)    Meds ordered this encounter  Medications  . temazepam (RESTORIL) 30 MG capsule    Sig: Take 1 capsule (30 mg total) by mouth at bedtime as needed. for sleep    Dispense:  30 capsule    Refill:  5    Return precautions advised.  [name], MD

## 2016-07-23 NOTE — Assessment & Plan Note (Signed)
S: due to stressors appetite has been low- has lost at least 10 lbs over last few months A/P: lipids controlled last visit without meds- will continue without as likely diet/exercise controlled

## 2016-07-23 NOTE — Progress Notes (Signed)
Pre visit review using our clinic review tool, if applicable. No additional management support is needed unless otherwise documented below in the visit note. 

## 2016-07-23 NOTE — Assessment & Plan Note (Signed)
S: patient was seen by sports medicine and podiatry. She was advised to use some gel toe covers.  A/P: remarkably this has resolved her ulceration due to hammer toes. She plans to continue this to allow her to avoid ulceration. Will remove issue at this point

## 2016-07-24 ENCOUNTER — Encounter: Payer: Self-pay | Admitting: Family Medicine

## 2016-07-26 ENCOUNTER — Other Ambulatory Visit: Payer: Self-pay

## 2016-07-26 MED ORDER — ESCITALOPRAM OXALATE 20 MG PO TABS: 20.0000 mg | ORAL_TABLET | Freq: Every day | ORAL | 1 refills | Status: DC

## 2016-07-26 NOTE — Telephone Encounter (Signed)
Pt is waiting on Rx Lexapro   Pharm:  [facility].  Pt would like to have a call once this is done.

## 2016-07-28 ENCOUNTER — Encounter: Payer: Self-pay | Admitting: Family Medicine

## 2016-07-28 DIAGNOSIS — F331 Major depressive disorder, recurrent, moderate: Secondary | ICD-10-CM

## 2016-07-29 MED ORDER — SERTRALINE HCL 100 MG PO TABS: 100.0000 mg | ORAL_TABLET | Freq: Every day | ORAL | 3 refills | Status: DC

## 2016-07-29 NOTE — Assessment & Plan Note (Signed)
Prozac 40mg -->lexapro 20mg  (GI distress reported by mychart)-->zoloft 100mg  trial

## 2016-07-30 ENCOUNTER — Telehealth: Payer: Self-pay | Admitting: Obstetrics & Gynecology

## 2016-07-30 NOTE — Telephone Encounter (Signed)
Patient wants to speak with you about a surgery she has had to postpone.  So Dr Sabra Heck Does not come to the hospital to remove her Estring.

## 2016-08-02 ENCOUNTER — Encounter: Payer: Self-pay | Admitting: Family Medicine

## 2016-08-03 ENCOUNTER — Telehealth: Payer: Self-pay | Admitting: Family Medicine

## 2016-08-03 DIAGNOSIS — E1151 Type 2 diabetes mellitus with diabetic peripheral angiopathy without gangrene: Secondary | ICD-10-CM | POA: Diagnosis not present

## 2016-08-03 DIAGNOSIS — L603 Nail dystrophy: Secondary | ICD-10-CM | POA: Diagnosis not present

## 2016-08-03 DIAGNOSIS — L84 Corns and callosities: Secondary | ICD-10-CM | POA: Diagnosis not present

## 2016-08-03 DIAGNOSIS — I739 Peripheral vascular disease, unspecified: Secondary | ICD-10-CM | POA: Diagnosis not present

## 2016-08-03 DIAGNOSIS — Z01810 Encounter for preprocedural cardiovascular examination: Secondary | ICD-10-CM

## 2016-08-03 NOTE — Telephone Encounter (Signed)
For upcoming knee replacement-Cant clear off activity level for cardiac for knee replacement (cannot do 4 mets) would consider stress testing or cardiology referral  I am happy to place referral. Please provide dx

## 2016-08-03 NOTE — Telephone Encounter (Signed)
° ° ° °  Pt said she contacted [facility] on [facility] and they told her she need a referral

## 2016-08-03 NOTE — Telephone Encounter (Signed)
I placed referral. Please inform patient.

## 2016-08-04 ENCOUNTER — Ambulatory Visit (INDEPENDENT_AMBULATORY_CARE_PROVIDER_SITE_OTHER): Payer: Medicare Other | Admitting: Pulmonary Disease

## 2016-08-04 ENCOUNTER — Encounter: Payer: Self-pay | Admitting: Pulmonary Disease

## 2016-08-04 VITALS — BP 134/70 | HR 92

## 2016-08-04 DIAGNOSIS — R911 Solitary pulmonary nodule: Secondary | ICD-10-CM | POA: Diagnosis not present

## 2016-08-04 NOTE — Patient Instructions (Addendum)
For your bronchiectasis: I recommend that you perform mucociliary clearance measures twice a day: This can include using the flutter valve, exercise, chest physiotherapy, or the intentional cough technique I described today in clinic  From my standpoint it's okay for you to have knee surgery this summer  Use Symbicort as you are doing, I just recommend that when you use it use it for up to 3 days twice a day  I will see back in 6 months after your CT scan or sooner if needed

## 2016-08-04 NOTE — Assessment & Plan Note (Signed)
This is been a stable interval for her. She has bronchiectasis complicated by tracheomalacia.  She had one exacerbation in the last year but otherwise nothing else worrisome.  Her lung function is relatively well preserved so she is low risk for a perioperative pulmonary complication. I explained to her today that going into her knee replacement she's going to need to get out of bed right away, use the flutter valve regularly, and uses incentive spirometry.  In regards to her Symbicort use, I'm okay with her using it on an as-needed basis as there is some data to support this. However, I have recommended that she use it twice a day for at least 3 days when she feels increasing chest congestion and wheezing.  Today I reminded her that it's important for her to use mucociliary clearance measures twice a day. We reviewed intentional cough technique, flutter valve technique and chest physiotherapy. Unfortunately she's not able to use exercise because of her knee problems.  > 50% of this 27 minute visit spent face to face

## 2016-08-04 NOTE — Progress Notes (Signed)
Subjective:    Patient ID: [name], female    DOB: 18-Aug-1940, 76 y.o.   MRN: [id number]  Synopsis: First evaluated by [facility] in 2017 in the setting of a chronic cough and recurrent respiratory infections. She had many childhood illnesses, but she doesn't recall one severe childhood illness.  She was found in July 2017 on high-resolution CT scanning of the chest to have bronchiectasis and severe tracheomalacia. Immunoglobulin testing an alpha-1 testing were normal. July 2017 pulmonary function testing ratio 77%, FEV1 2.02 L, 71% predicted, FVC 2.63 L 70% predicted, total lung capacity 5.02 L, 81% predicted, DLCO 25.7 274% predicted  HPI Chief Complaint  Patient presents with  . Follow-up    pt c/o increased mucus production- mucus is clear and worse qam.     [name] has struggled with knee pain and is supposed to have a knee surgery in June.  She says that her breathing is bad on some days, better on others. She says that she has a dry cough.  She is currently taking pulmicort on an as needed basis.  She takes it when she has cough a lot.  She says that when she has a lot of mucus production she takes it, it helps.  She has not been out trying to avoid flu.  She had a round of bronchitis a few months.    Her mucus production is unpredictable.  She has not exercised.      Past Medical History:  Diagnosis Date  . Arthritis    right knee;injections every 60months   . Asthma   . Cancer (HCC)    HX BREAST CANCER  . COMPRESSION FRACTURE, LUMBAR VERTEBRAE 08/21/2008   Qualifier: Diagnosis of  By: Arnoldo Morale MD, Balinda Quails   . Depression   . Diabetes mellitus    takes Amaryl and Januvia daily  . Difficulty sleeping   . Diverticulitis   . Early cataracts, bilateral   . Eczema   . Endometriosis   . Gastritis   . Glaucoma   . Hammer toe   . Headache(784.0)    Migraines  . Hypertension    takes Atenolol daily  . IBS (irritable bowel syndrome)   . Joint pain   .  Kidney stone   . Neuropathy ([place])   . Open wound of second toe of left foot   . Osteomyelitis ([place])    left 2nd toe  . Osteopenia   . PONV (postoperative nausea and vomiting)   . Posterior tibial tendon dysfunction    left foot  . Scoliosis   . 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 Vitamin D deficiency    takes Vit d daily      Review of Systems  Constitutional: Negative for chills, fatigue and fever.  HENT: Positive for sore throat. Negative for postnasal drip, rhinorrhea and sinus pressure.   Respiratory: Positive for cough and shortness of breath. Negative for wheezing.   Cardiovascular: Negative for chest pain and leg swelling.       Objective:   Physical Exam Vitals:   08/04/16 1626  BP: 134/70  Pulse: 92  SpO2: 92%  RA  Gen: well appearing HENT: OP clear, TM's clear, neck supple PULM: CTA B, normal percussion CV: RRR, no mgr, trace edema GI: BS+, soft, nontender Derm: no cyanosis or rash Psyche: normal mood and affect   CBC    Component Value Date/Time   WBC 10.8 (H) 02/16/2016  1646   RBC 4.50 02/16/2016 1646   HGB 13.5 02/16/2016 1646   HCT 40.4 02/16/2016 1646   PLT 265.0 02/16/2016 1646   MCV 89.7 02/16/2016 1646   MCH 28.3 10/08/2015 0519   MCHC 33.4 02/16/2016 1646   RDW 14.5 02/16/2016 1646   LYMPHSABS 1.3 10/04/2015 2158   MONOABS 0.7 10/04/2015 2158   EOSABS 0.0 10/04/2015 2158   BASOSABS 0.0 10/04/2015 2158    Records from her visit with her primary care physician reviewed from 2 weeks ago where she was treated for diabetic foot ulcer, depression.     Assessment & Plan:  Bronchiectasis without acute exacerbation ([place]) This is been a stable interval for her. She has bronchiectasis complicated by tracheomalacia.  She had one exacerbation in the last year but otherwise nothing else worrisome.  Her lung function is relatively well preserved so she is low risk for a perioperative pulmonary  complication. I explained to her today that going into her knee replacement she's going to need to get out of bed right away, use the flutter valve regularly, and uses incentive spirometry.  In regards to her Symbicort use, I'm okay with her using it on an as-needed basis as there is some data to support this. However, I have recommended that she use it twice a day for at least 3 days when she feels increasing chest congestion and wheezing.  Today I reminded her that it's important for her to use mucociliary clearance measures twice a day. We reviewed intentional cough technique, flutter valve technique and chest physiotherapy. Unfortunately she's not able to use exercise because of her knee problems.  > 50% of this 27 minute visit spent face to face  Solitary pulmonary nodule Repeat CT chest in 01/2017    Current Outpatient Prescriptions:  .  albuterol (PROVENTIL HFA;VENTOLIN HFA) 108 (90 Base) MCG/ACT inhaler, Inhale 2 puffs into the lungs every 6 (six) hours as needed for wheezing or shortness of breath., Disp: 1 Inhaler, Rfl: 2 .  atenolol (TENORMIN) 25 MG tablet, Take 1 tablet (25 mg total) by mouth daily., Disp: 90 tablet, Rfl: 3 .  betamethasone dipropionate (DIPROLENE) 0.05 % cream, Apply 1 application topically 2 (two) times daily as needed (dermatitis)., Disp: 30 g, Rfl: 1 .  budesonide-formoterol (SYMBICORT) 160-4.5 MCG/ACT inhaler, Inhale 2 puffs into the lungs 2 (two) times daily., Disp: 1 Inhaler, Rfl: 5 .  cholestyramine (QUESTRAN) 4 g packet, Take 1 packet (4 g total) by mouth 3 (three) times daily with meals., Disp: 90 each, Rfl: 6 .  cyanocobalamin (,VITAMIN B-12,) 1000 MCG/ML injection, Inject 1 mL (1,000 mcg total) into the muscle every 30 (thirty) days., Disp: 1 mL, Rfl: 11 .  dorzolamide-timolol (COSOPT) 22.3-6.8 MG/ML ophthalmic solution, Place 1 drop into both eyes 2 (two) times daily. , Disp: , Rfl:  .  estradiol (ESTRING) 2 MG vaginal ring, Place 2 mg vaginally every 3  (three) months. follow package directions, Disp: 1 each, Rfl: 4 .  Flaxseed, Linseed, (FLAX SEED OIL PO), Take 1 tablet by mouth daily., Disp: , Rfl:  .  FLUoxetine (PROZAC) 40 MG capsule, Take 40 mg by mouth daily., Disp: , Rfl:  .  glimepiride (AMARYL) 4 MG tablet, TAKE ONE TABLET BY MOUTH ONCE DAILY WITH BREAKFAST, Disp: 90 tablet, Rfl: 3 .  glucose blood (FREESTYLE LITE) test strip, USE TO CHECK BLOOD SUGAR DAILY AND AS NEEDED, Disp: 100 each, Rfl: 5 .  Isometheptene-Caffeine-APAP 65-20-325 MG TABS, Take 1-2 tablets as needed for migraine  twice a day maximum, Disp: 30 tablet, Rfl: 5 .  liraglutide (VICTOZA) 18 MG/3ML SOPN, INJECT 1.8MG  INTO THE SKIN DAILY., Disp: 9 pen, Rfl: 6 .  lisinopril (PRINIVIL,ZESTRIL) 10 MG tablet, Take 1 tablet (10 mg total) by mouth daily., Disp: 90 tablet, Rfl: 3 .  LUMIGAN 0.01 % SOLN, Place 1 drop into both eyes at bedtime. , Disp: , Rfl:  .  Methylcellulose, Laxative, (CITRUCEL) 500 MG TABS, Take 2 tablets by mouth daily after supper. , Disp: , Rfl:  .  Omega-3 Fatty Acids (FISH OIL PO), Take 2 tablets by mouth daily., Disp: , Rfl:  .  pramipexole (MIRAPEX) 0.5 MG tablet, Take 1 tablet (0.5 mg total) by mouth at bedtime., Disp: 90 tablet, Rfl: 1 .  Respiratory Therapy Supplies (FLUTTER) DEVI, Use as directed, Disp: 1 each, Rfl: 0 .  temazepam (RESTORIL) 30 MG capsule, Take 1 capsule (30 mg total) by mouth at bedtime as needed. for sleep, Disp: 30 capsule, Rfl: 5 .  terconazole (TERAZOL 7) 0.4 % vaginal cream, Place 1 applicator vaginally at bedtime., Disp: 45 g, Rfl: 0

## 2016-08-04 NOTE — Telephone Encounter (Signed)
Spoke with husband Clair Gulling who verbalized understanding and stated he would let her know

## 2016-08-04 NOTE — Assessment & Plan Note (Signed)
Repeat CT chest in 01/2017

## 2016-08-05 ENCOUNTER — Ambulatory Visit: Payer: Self-pay | Admitting: Orthopedic Surgery

## 2016-08-05 DIAGNOSIS — H524 Presbyopia: Secondary | ICD-10-CM | POA: Diagnosis not present

## 2016-08-05 DIAGNOSIS — H26492 Other secondary cataract, left eye: Secondary | ICD-10-CM | POA: Diagnosis not present

## 2016-08-10 NOTE — Telephone Encounter (Signed)
Patient calling to let us know her surgery originally scheduled for 08-23-16 has been moved to 10-18-16. States Dr Sabra Heck was coming to OR to remove her Estring for her prior to the surgery and she didn't want her to show up on 08-23-16. Patient states Sherron Flemings is changed Q4 months. Last seen on 07-02-16. Surgery is scheduled for 10-18-16 at [place] at [facility].  Advised will review with Dr Sabra Heck for instructions on removal.

## 2016-08-10 NOTE — Telephone Encounter (Signed)
Can you let her know this is an OR day for me at [facility].  She should let me just take it out before surgery and leave it out until she can bend her knee enough for replacement.

## 2016-08-11 NOTE — Telephone Encounter (Signed)
Call to patient. Message given to patient as seen below from Dr. Sabra Heck. Verbalized understanding. Patient scheduled for estring removal on Friday 10/15/16 at 1430. Patient agreeable to date and time of appointment.   Routing to provider for final review. Patient agreeable to disposition. Will close encounter.

## 2016-08-18 ENCOUNTER — Encounter: Payer: Self-pay | Admitting: Cardiology

## 2016-08-23 ENCOUNTER — Inpatient Hospital Stay: Admit: 2016-08-23 | Payer: Medicare Other | Admitting: Orthopedic Surgery

## 2016-08-23 SURGERY — ARTHROPLASTY, KNEE, TOTAL
Anesthesia: Choice | Site: Knee | Laterality: Left

## 2016-08-24 ENCOUNTER — Telehealth: Payer: Self-pay | Admitting: Family Medicine

## 2016-08-26 DIAGNOSIS — H26492 Other secondary cataract, left eye: Secondary | ICD-10-CM | POA: Diagnosis not present

## 2016-08-26 DIAGNOSIS — H524 Presbyopia: Secondary | ICD-10-CM | POA: Diagnosis not present

## 2016-09-03 ENCOUNTER — Encounter: Payer: Self-pay | Admitting: Family Medicine

## 2016-09-03 ENCOUNTER — Ambulatory Visit (INDEPENDENT_AMBULATORY_CARE_PROVIDER_SITE_OTHER): Payer: Medicare Other | Admitting: Family Medicine

## 2016-09-03 DIAGNOSIS — F330 Major depressive disorder, recurrent, mild: Secondary | ICD-10-CM

## 2016-09-03 NOTE — Patient Instructions (Signed)
Continue prozac and your chats with God. Let us know if you want to try counseling  In the next few weeks I would also like for you to sign up for an annual wellness visit with our nurse, Manuela Schwartz, who specializes in the annual wellness exam. This is a free benefit under medicare that may help [name] find additional ways to help you. Some highlights are reviewing medications, lifestyle, and doing a dementia screen.

## 2016-09-03 NOTE — Progress Notes (Signed)
Pre visit review using our clinic review tool, if applicable. No additional management support is needed unless otherwise documented below in the visit note. 

## 2016-09-03 NOTE — Progress Notes (Signed)
Subjective:  [name] is a 76 y.o. year old very pleasant female patient who presents for/with See problem oriented charting ROS- no SI. Some depressed mood and anhedonia. Does feel like napping in day and sleeps poorly at night as result. Admits to poor appetite.    Past Medical History-  Patient Active Problem List   Diagnosis Date Noted  . 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    Priority: High  . Bronchiectasis without acute exacerbation ([place]) 10/18/2015    Priority: High  . Diabetes mellitus type II, controlled ([place]) 12/28/2006    Priority: High  . Aortic atherosclerosis ([place]) 02/16/2016    Priority: Medium  . Solitary pulmonary nodule 01/08/2016    Priority: Medium  . IBS (irritable bowel syndrome) 07/17/2014    Priority: Medium  . Depression 01/03/2014    Priority: Medium  . Essential hypertension, benign 01/03/2014    Priority: Medium  . Fatty liver 03/18/2009    Priority: Medium  . Hyperlipidemia 07/26/2007    Priority: Medium  . ANEMIA, B12 DEFICIENCY 12/29/2006    Priority: Medium  . RESTLESS LEG SYNDROME, MILD 12/28/2006    Priority: Medium  . OSTEOPOROSIS 12/12/2006    Priority: Medium  . Acute diverticulitis 10/05/2015    Priority: Low  . CKD (chronic kidney disease), stage II 01/22/2014    Priority: Low  . Posterior tibial tendon dysfunction 01/03/2014    Priority: Low  . Osteoarthritis, knee 01/03/2014    Priority: Low  . Glaucoma 01/03/2014    Priority: Low  . Obesity (BMI 30-39.9) 06/15/2013    Priority: Low  . Foot tendinitis 07/20/2010    Priority: Low  . INSOMNIA, CHRONIC 07/25/2009    Priority: Low  . GERD 12/25/2007    Priority: Low  . NEUROPATHY, IDIOPATHIC PERIPHERAL NEC 12/28/2006    Priority: Low  . BREAST CANCER, HX OF 12/12/2006    Priority: Low  . Arthritis of midfoot 03/03/2016  . Hammertoes of both feet 03/03/2016    Medications- reviewed and updated Current Outpatient Prescriptions  Medication Sig Dispense Refill  . atenolol  (TENORMIN) 25 MG tablet Take 1 tablet (25 mg total) by mouth daily. 90 tablet 3  . betamethasone dipropionate (DIPROLENE) 0.05 % cream Apply 1 application topically 2 (two) times daily as needed (dermatitis). 30 g 1  . cholestyramine (QUESTRAN) 4 g packet Take 1 packet (4 g total) by mouth 3 (three) times daily with meals. 90 each 6  . cyanocobalamin (,VITAMIN B-12,) 1000 MCG/ML injection Inject 1 mL (1,000 mcg total) into the muscle every 30 (thirty) days. 1 mL 11  . dorzolamide-timolol (COSOPT) 22.3-6.8 MG/ML ophthalmic solution Place 1 drop into both eyes 2 (two) times daily.     [name] estradiol (ESTRING) 2 MG vaginal ring Place 2 mg vaginally every 3 (three) months. follow package directions 1 each 4  . Flaxseed, Linseed, (FLAX SEED OIL PO) Take 1 tablet by mouth daily.    [name] FLUoxetine (PROZAC) 40 MG capsule Take 40 mg by mouth daily.    [name] glimepiride (AMARYL) 4 MG tablet TAKE ONE TABLET BY MOUTH ONCE DAILY WITH BREAKFAST 90 tablet 3  . glucose blood (FREESTYLE LITE) test strip USE TO CHECK BLOOD SUGAR DAILY AND AS NEEDED 100 each 5  . Isometheptene-Caffeine-APAP 65-20-325 MG TABS Take 1-2 tablets as needed for migraine twice a day maximum 30 tablet 5  . liraglutide (VICTOZA) 18 MG/3ML SOPN INJECT 1.8MG  INTO THE SKIN DAILY. 9 pen 6  . lisinopril (PRINIVIL,ZESTRIL) 10 MG tablet Take 1 tablet (  10 mg total) by mouth daily. 90 tablet 3  . LUMIGAN 0.01 % SOLN Place 1 drop into both eyes at bedtime.     . Methylcellulose, Laxative, (CITRUCEL) 500 MG TABS Take 2 tablets by mouth daily after supper.     . Omega-3 Fatty Acids (FISH OIL PO) Take 2 tablets by mouth daily.    . pramipexole (MIRAPEX) 0.5 MG tablet Take 1 tablet (0.5 mg total) by mouth at bedtime. 90 tablet 1  . Respiratory Therapy Supplies (FLUTTER) DEVI Use as directed 1 each 0  . SYMBICORT 160-4.5 MCG/ACT inhaler Take 2 puffs by mouth 2 (two) times daily.    . temazepam (RESTORIL) 30 MG capsule Take 1 capsule (30 mg total) by mouth at  bedtime as needed. for sleep 30 capsule 5  . terconazole (TERAZOL 7) 0.4 % vaginal cream Place 1 applicator vaginally at bedtime. 45 g 0  . budesonide-formoterol (SYMBICORT) 160-4.5 MCG/ACT inhaler Inhale 2 puffs into the lungs 2 (two) times daily. 1 Inhaler 5   No current facility-administered medications for this visit.     Objective: BP 132/72 (BP Location: Left Arm, Patient Position: Sitting, Cuff Size: Large)   Pulse 83   Temp 98.5 F (36.9 C) (Oral)   Ht 5\' 11"  (1.803 m)   Wt 252 lb (114.3 kg)   LMP 05/10/1992   SpO2 95%   BMI 35.15 kg/m  Gen: NAD, resting comfortably Not tearful or anxious appearing  Assessment/Plan:  Depression S: PHQ9 last visit of 14 without SI. We changed from prozac 40mg  to lexapro 20mg . Declined counseling last visit. She had GI distress on lexapro. zoloft also with Gi distress. Went back to prozac. She has tried to get out more. The days being longer/brighter have helped. Not being as worried about flu and getting out of house has been helpful to her. Most important for her have been time in prayer and releasing things that she is not in control of.  No SI. Still having stress of son with seizures daily but knows nothing she can do other than help him with follow up.  A/P: PHQ9 has improved to 9 with phq2 of 2- she feels much better. 6 of her points are for poor appetite and feeling tired- she has gained weight though and admits to not eating right foods. Also she takes naps in day making sleeping harder at night. Advised healthier diet and avoiding napping- she will trial this.   Counseling provided today- she feels encouraged by her improvement and wants to continue the same regimen. We discussed 4 month follow up but sooner if worsening symptoms  Return in about 4 months (around 01/03/2017) for follow up- or sooner if needed.  The duration of face-to-face time during this visit was greater than 15 minutes. Greater than 50% of this time was spent in  counseling, explanation of diagnosis, planning of further management, and/or coordination of care including stress management, counseling on depression.    Meds ordered this encounter  Medications  . SYMBICORT 160-4.5 MCG/ACT inhaler    Sig: Take 2 puffs by mouth 2 (two) times daily.   Return precautions advised.  [name], MD

## 2016-09-03 NOTE — Assessment & Plan Note (Signed)
S: PHQ9 last visit of 14 without SI. We changed from prozac 40mg  to lexapro 20mg . Declined counseling last visit. She had GI distress on lexapro. zoloft also with Gi distress. Went back to prozac. She has tried to get out more. The days being longer/brighter have helped. Not being as worried about flu and getting out of house has been helpful to her. Most important for her have been time in prayer and releasing things that she is not in control of.  No SI. Still having stress of son with seizures daily but knows nothing she can do other than help him with follow up.  A/P: PHQ9 has improved to 9 with phq2 of 2- she feels much better. 6 of her points are for poor appetite and feeling tired- she has gained weight though and admits to not eating right foods. Also she takes naps in day making sleeping harder at night. Advised healthier diet and avoiding napping- she will trial this.   Counseling provided today- she feels encouraged by her improvement and wants to continue the same regimen. We discussed 4 month follow up but sooner if worsening symptoms

## 2016-09-06 ENCOUNTER — Ambulatory Visit (INDEPENDENT_AMBULATORY_CARE_PROVIDER_SITE_OTHER): Payer: Medicare Other | Admitting: Cardiology

## 2016-09-06 ENCOUNTER — Encounter: Payer: Self-pay | Admitting: Cardiology

## 2016-09-06 ENCOUNTER — Telehealth (HOSPITAL_COMMUNITY): Payer: Self-pay | Admitting: *Deleted

## 2016-09-06 ENCOUNTER — Encounter (HOSPITAL_COMMUNITY): Payer: Medicare Other

## 2016-09-06 VITALS — BP 132/68 | HR 91 | Ht 72.0 in | Wt 251.0 lb

## 2016-09-06 DIAGNOSIS — J479 Bronchiectasis, uncomplicated: Secondary | ICD-10-CM | POA: Diagnosis not present

## 2016-09-06 DIAGNOSIS — I10 Essential (primary) hypertension: Secondary | ICD-10-CM

## 2016-09-06 DIAGNOSIS — E119 Type 2 diabetes mellitus without complications: Secondary | ICD-10-CM

## 2016-09-06 DIAGNOSIS — R0602 Shortness of breath: Secondary | ICD-10-CM

## 2016-09-06 DIAGNOSIS — Z01810 Encounter for preprocedural cardiovascular examination: Secondary | ICD-10-CM

## 2016-09-06 NOTE — Patient Instructions (Signed)
Medication Instructions:  The current medical regimen is effective;  continue present plan and medications.  Testing/Procedures: Your physician has requested that you have a lexiscan myoview. For further information please visit [email or web address]. Please follow instruction sheet, as given.  Follow-Up: Follow up as needed after testing.  Thank you for choosing [facility]!!     

## 2016-09-06 NOTE — Progress Notes (Signed)
Cardiology Office Note:    Date:  09/06/2016   ID:  [name], [name] November 13, 1940, MRN [id number]  PCP:  [name], MD  Cardiologist:  Candee Furbish, MD    Referring MD: Marin Olp, MD     History of Present Illness:    [name] is a 76 y.o. female here for preoperative evaluation for knee surgery at the request of Dr. Yong Channel and Dr. Wynelle Link.  She has no prior cardiac history. She does have diabetes, bronchiectasis, essential hypertension, aortic atherosclerosis. She had been seen in pulmonary clinic by Dr. Lake Bells. 77% FEV1. Mucus production noted. He stated that it was fine for her to proceed from pulmonary standpoint for knee surgery. Use Symbicort as needed. Dr. Doreatha Lew had seen her in the past.  Unfortunately given her musculoskeletal issues, she is not able to adequately climb a flight of stairs or walk a city block. She does feel some shortness of breath with activity. She denies chest pain, no angina, no syncope, no palpitations. No bleeding problems.  Father MI in 67's. Mother died of RA.   Her husband is here with her, he has atrial fibrillation and is seen by Dr. Rayann Heman.   Past Medical History:  Diagnosis Date  . Arthritis    right knee;injections every 73months   . Asthma   . Cancer (HCC)    HX BREAST CANCER  . COMPRESSION FRACTURE, LUMBAR VERTEBRAE 08/21/2008   Qualifier: Diagnosis of  By: Arnoldo Morale MD, Balinda Quails   . Depression   . Diabetes mellitus    takes Amaryl and Januvia daily  . Difficulty sleeping   . Diverticulitis   . Early cataracts, bilateral   . Eczema   . Endometriosis   . Gastritis   . Glaucoma   . Hammer toe   . Headache(784.0)    Migraines  . Hypertension    takes Atenolol daily  . IBS (irritable bowel syndrome)   . Joint pain   . Kidney stone   . Neuropathy   . Open wound of second toe of left foot   . Osteomyelitis ([place])    left 2nd toe  . Osteopenia   . PONV (postoperative nausea and vomiting)   . Posterior  tibial tendon dysfunction    left foot  . Scoliosis   . 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 Vitamin D deficiency    takes Vit d daily    Past Surgical History:  Procedure Laterality Date  . ADENOIDECTOMY     at age 58  . biopsy on back     benign  . CATARACT EXTRACTION    . CHOLECYSTECTOMY  06/2008  . COLONOSCOPY  11/02/05  . DILATION AND CURETTAGE OF UTERUS    . ENDOMETRIAL BIOPSY  06/22/2011   benign polyp, no hyperplasia  . excision on breast     internal infected suture from breast surgery  . excision removed from neck     infected lymph node  . FOOT SURGERY     left foot-cleaning out areas on toes at the wound center-having MRI to determine if osteomyelitis  . HYSTEROSCOPY  06/22/11   PMB submucosal myoma  . JOINT REPLACEMENT     left total shoulder  . LAPAROSCOPIC OOPHORECTOMY Right 12/2004   absent LSO  . LAPAROTOMY    . MASTECTOMY Bilateral 1994 right, 1995 left  . SHOULDER HEMI-ARTHROPLASTY  01/01/2012   Procedure: SHOULDER HEMI-ARTHROPLASTY;  Surgeon: Roseanne Kaufman,  MD;  Location: [facility];  Service: Orthopedics;  Laterality: Left;  Left Shoulder Hemi Arthroplasty with Repair and Reconstruction as Necessary   . THYROIDECTOMY     62yrs ago. follows endocrine  . TONSILLECTOMY    . TOTAL KNEE ARTHROPLASTY Right 12/30/2014   Procedure: RIGHT TOTAL KNEE ARTHROPLASTY;  Surgeon: Gaynelle Arabian, MD;  Location: [facility];  Service: Orthopedics;  Laterality: Right;    Current Medications: Current Meds  Medication Sig  . atenolol (TENORMIN) 25 MG tablet Take 1 tablet (25 mg total) by mouth daily.  . betamethasone dipropionate (DIPROLENE) 0.05 % cream Apply 1 application topically 2 (two) times daily as needed (dermatitis).  . budesonide-formoterol (SYMBICORT) 160-4.5 MCG/ACT inhaler Inhale 2 puffs into the lungs 2 (two) times daily.  . cyanocobalamin (,VITAMIN B-12,) 1000 MCG/ML injection Inject 1 mL (1,000 mcg total) into the muscle every 30  (thirty) days.  . dorzolamide-timolol (COSOPT) 22.3-6.8 MG/ML ophthalmic solution Place 1 drop into both eyes 2 (two) times daily.   [name] estradiol (ESTRING) 2 MG vaginal ring Place 2 mg vaginally every 3 (three) months. follow package directions  . Flaxseed, Linseed, (FLAX SEED OIL PO) Take 1 tablet by mouth daily.  [name] FLUoxetine (PROZAC) 40 MG capsule Take 40 mg by mouth daily.  [name] glimepiride (AMARYL) 4 MG tablet TAKE ONE TABLET BY MOUTH ONCE DAILY WITH BREAKFAST  . glucose blood (FREESTYLE LITE) test strip USE TO CHECK BLOOD SUGAR DAILY AND AS NEEDED  . Isometheptene-Caffeine-APAP 65-20-325 MG TABS Take 1-2 tablets as needed for migraine twice a day maximum  . liraglutide (VICTOZA) 18 MG/3ML SOPN INJECT 1.8MG  INTO THE SKIN DAILY.  [name] lisinopril (PRINIVIL,ZESTRIL) 10 MG tablet Take 1 tablet (10 mg total) by mouth daily.  [name] LUMIGAN 0.01 % SOLN Place 1 drop into both eyes at bedtime.   . Methylcellulose, Laxative, (CITRUCEL) 500 MG TABS Take 2 tablets by mouth daily after supper.   . Omega-3 Fatty Acids (FISH OIL PO) Take 2 tablets by mouth daily.  . pramipexole (MIRAPEX) 0.5 MG tablet Take 1 tablet (0.5 mg total) by mouth at bedtime.  [name] Respiratory Therapy Supplies (FLUTTER) DEVI Use as directed  . temazepam (RESTORIL) 30 MG capsule Take 1 capsule (30 mg total) by mouth at bedtime as needed. for sleep     Allergies:   Cephalexin; Erythromycin ethylsuccinate; Levaquin [levofloxacin in d5w]; Nitrofurantoin; and Percocet [oxycodone-acetaminophen]   Social History   Social History  . Marital status: Married    Spouse name: N/A  . Number of children: N/A  . Years of education: N/A   Social History Main Topics  . Smoking status: Never Smoker  . Smokeless tobacco: Never Used  . Alcohol use No  . Drug use: No  . Sexual activity: Not Asked   Other Topics Concern  . None   Social History Narrative   Married 39 years in 2015. 1 adopted son. 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 There is no history of Colon cancer, Esophageal cancer, Rectal cancer, or Stomach cancer. ROS:   Please see the history of present illness.   IBS, depression, wheezing  All other systems reviewed and are negative.   EKGs/Labs/Other Studies Reviewed:    EKG:  EKG is  ordered today.  The ekg ordered today demonstrates 09/06/16-sinus rhythm 85 with no other abnormalities personally viewed  Recent Labs: 02/16/2016: ALT 21; BUN 23; Creatinine, Ser 0.70; Hemoglobin 13.5; Platelets 265.0; Potassium 4.8; Sodium 141   Recent Lipid Panel    Component Value Date/Time   CHOL 155 02/12/2013 1202   TRIG 84.0 02/12/2013 1202   HDL 47.30 02/12/2013 1202   CHOLHDL 3 02/12/2013 1202   VLDL 16.8 02/12/2013 1202   LDLCALC 91 02/12/2013 1202   LDLDIRECT 77.0 02/16/2016 1646    Physical Exam:    VS:  BP 132/68   Pulse 91   Ht 6' (1.829 m)   Wt 251 lb (113.9 kg)   LMP 05/10/1992   SpO2 94%   BMI 34.04 kg/m     Wt Readings from Last 3 Encounters:  09/06/16 251 lb (113.9 kg)  09/03/16 252 lb (114.3 kg)  07/23/16 246 lb (111.6 kg)     GEN:  Well nourished, well developed in no acute distress, in wheel chair. Jewelry noted HEENT: Normal NECK: No JVD; No carotid bruits LYMPHATICS: No lymphadenopathy CARDIAC: RRR, no murmurs, rubs, gallops RESPIRATORY:  Clear to auscultation without rales, wheezing or rhonchi  ABDOMEN: Soft, non-tender, non-distended, overweight MUSCULOSKELETAL:  No edema; left ankle deformity noted  SKIN: Warm and dry NEUROLOGIC:  Alert and oriented x 3 PSYCHIATRIC:  Normal affect   ASSESSMENT:    1. Pre-operative cardiovascular examination   2. Shortness of breath   3. Bronchiectasis without complication ([place])   4. Diabetes mellitus with  coincident hypertension (HCC)    PLAN:    In order of problems listed above:  Preoperative risk stratification prior to knee replacement  - She is unable to complete greater than 4 METS of activity, i.e. she cannot go up a flight of stairs currently, cannot walk a city block because of her musculoskeletal issues. She does feel some shortness of breath with activity she is pinning on deconditioning.  - I will proceed with pharmacologic stress test. She has had one previously with Dr. Doreatha Lew many years ago.  Bronchiectasis  - Symbicort when necessary. Followed by pulmonary.  - They have given the go ahead for surgery if necessary from a pulmonary standpoint  Diabetes with hypertension  - Well controlled per primary team. No changes.  Knee osteoarthritis  - She does note that she had a hard time with rehabilitation post last knee arthroplasty.  Carotid artery atherosclerosis/plaque  - Continue with treatment of diabetes, hypertension. Mild plaque noted on 10/14/15. No need for future vascular study unless indicated by symptoms or exam.  - Given her diabetes, I would recommend statin therapy. She can discuss this further with Dr. Yong Channel. She did note that many of her friends have told stories about troubles with statins. I stated that anybody with diabetes since it is a coronary artery disease equivalent deserves statin therapy and have shown benefit.  Medication Adjustments/Labs and Tests Ordered: Current medicines are reviewed at length with the patient today.  Concerns regarding medicines are outlined above. Labs and tests ordered and medication changes are outlined in the patient instructions below:  Patient Instructions  Medication Instructions:  The current medical regimen is effective;  continue present plan and medications.  Testing/Procedures: Your physician has requested that you have a lexiscan myoview. For further information please visit [email or web address]. Please follow  instruction sheet, as given.  Follow-Up: Follow up as needed after testing.  Thank you for choosing [facility]!!  Signed, Candee Furbish, MD  09/06/2016 11:19 AM    [facility]

## 2016-09-06 NOTE — Telephone Encounter (Signed)
Patient given detailed instructions per Myocardial Perfusion Study Information Sheet for the test on 09/07/16. Patient notified to arrive 15 minutes early and that it is imperative to arrive on time for appointment to keep from having the test rescheduled.  If you need to cancel or reschedule your appointment, please call the office within 24 hours of your appointment. Failure to do so may result in a cancellation of your appointment, and a $50 no show fee. Patient verbalized understanding.[name] Jacqueline    

## 2016-09-07 ENCOUNTER — Ambulatory Visit (HOSPITAL_COMMUNITY): Payer: Medicare Other | Attending: Cardiovascular Disease

## 2016-09-07 DIAGNOSIS — Z01810 Encounter for preprocedural cardiovascular examination: Secondary | ICD-10-CM

## 2016-09-07 DIAGNOSIS — R0602 Shortness of breath: Secondary | ICD-10-CM | POA: Diagnosis not present

## 2016-09-07 MED ORDER — TECHNETIUM TC 99M TETROFOSMIN IV KIT
32.8000 | PACK | Freq: Once | INTRAVENOUS | Status: AC | PRN
  Administered 2016-09-07: 32.8 via INTRAVENOUS
  Filled 2016-09-07: qty 33

## 2016-09-07 MED ORDER — REGADENOSON 0.4 MG/5ML IV SOLN
0.4000 mg | Freq: Once | INTRAVENOUS | Status: AC
  Administered 2016-09-07: 0.4 mg via INTRAVENOUS

## 2016-09-08 ENCOUNTER — Ambulatory Visit (HOSPITAL_COMMUNITY): Payer: Medicare Other | Attending: Cardiology

## 2016-09-08 LAB — MYOCARDIAL PERFUSION IMAGING
CHL CUP NUCLEAR SDS: 6
CSEPPHR: 100 {beats}/min
LVDIAVOL: 65 mL (ref 46–106)
LVSYSVOL: 7 mL
RATE: 0.31
Rest HR: 88 {beats}/min
SRS: 10
SSS: 16
TID: 0.8

## 2016-09-08 MED ORDER — TECHNETIUM TC 99M TETROFOSMIN IV KIT
32.8000 | PACK | Freq: Once | INTRAVENOUS | Status: AC | PRN
  Administered 2016-09-08: 32.8 via INTRAVENOUS
  Filled 2016-09-08: qty 33

## 2016-09-09 ENCOUNTER — Telehealth: Payer: Self-pay | Admitting: Cardiology

## 2016-09-09 DIAGNOSIS — D518 Other vitamin B12 deficiency anemias: Secondary | ICD-10-CM | POA: Diagnosis not present

## 2016-09-09 NOTE — Telephone Encounter (Signed)
Pt aware of results and that she has been cleared for surgery.

## 2016-09-09 NOTE — Telephone Encounter (Signed)
New message ° ° ° ° ° °Calling to get stress test results °

## 2016-09-21 NOTE — Telephone Encounter (Signed)
Called to see if pt wanted to schedule awv - left message.  

## 2016-09-29 ENCOUNTER — Ambulatory Visit: Payer: Medicare Other

## 2016-10-06 ENCOUNTER — Telehealth: Payer: Self-pay | Admitting: Obstetrics & Gynecology

## 2016-10-06 NOTE — Telephone Encounter (Signed)
Patient is scheduled to have a total knee arthroplasty on 11/29/2016. Patient's Estring was last replaced on 07/02/2016. Asking if it is okay to leave her Estring in until surgery. She currently has an appointment for Estring removal on 10/15/2016. Routing to [place] for review and advise.

## 2016-10-06 NOTE — Telephone Encounter (Signed)
I would recommend just having it removed now so there is no issue with having it in place for several months as it is unknown at this time when she will have enough knee flexion to return for removal.  Thanks.

## 2016-10-06 NOTE — Telephone Encounter (Signed)
Spoke with patient's husband [name], okay per ROI. Husband requests a return call back after 2 pm as they are driving in the car at this time.

## 2016-10-06 NOTE — Telephone Encounter (Signed)
Patient wants to know if it is okay to leave her Estring in until her surgery.

## 2016-10-06 NOTE — Telephone Encounter (Signed)
Spoke with patient. Patient verbalizes understanding of recommendations from [place]. Asking if she will need a new ring replaced on 10/15/2016 to get her to her surgery and have it pilled right before then or if she can leave her current ring in and move her appointment to have it pulled right before her surgery in July.

## 2016-10-07 NOTE — Telephone Encounter (Signed)
I would just remove the ring and put in a new one when she is done with surgery.  It is a small amount of estrogen but I don't want her to have any additional increased risks for her to develop a DVT or PE post op.

## 2016-10-07 NOTE — Telephone Encounter (Signed)
Spoke with patient. Advised of message as seen below from [place]. Patient is agreeable and verbalizes understanding. Would like to move appointment for Estring removal out a few weeks. Appointment moved to 10/29/2016 at 2:30pm with Dr.Miller. Patient is agreeable to date and time.

## 2016-10-07 NOTE — Telephone Encounter (Signed)
Patient following up on yesterday's conversation

## 2016-10-10 DIAGNOSIS — D518 Other vitamin B12 deficiency anemias: Secondary | ICD-10-CM | POA: Diagnosis not present

## 2016-10-12 ENCOUNTER — Encounter: Payer: Self-pay | Admitting: Family Medicine

## 2016-10-13 ENCOUNTER — Other Ambulatory Visit: Payer: Self-pay

## 2016-10-13 DIAGNOSIS — E1151 Type 2 diabetes mellitus with diabetic peripheral angiopathy without gangrene: Secondary | ICD-10-CM | POA: Diagnosis not present

## 2016-10-13 DIAGNOSIS — L97529 Non-pressure chronic ulcer of other part of left foot with unspecified severity: Secondary | ICD-10-CM | POA: Diagnosis not present

## 2016-10-13 DIAGNOSIS — D72829 Elevated white blood cell count, unspecified: Secondary | ICD-10-CM

## 2016-10-13 DIAGNOSIS — E119 Type 2 diabetes mellitus without complications: Secondary | ICD-10-CM

## 2016-10-13 DIAGNOSIS — E538 Deficiency of other specified B group vitamins: Secondary | ICD-10-CM

## 2016-10-13 DIAGNOSIS — I739 Peripheral vascular disease, unspecified: Secondary | ICD-10-CM | POA: Diagnosis not present

## 2016-10-13 DIAGNOSIS — L600 Ingrowing nail: Secondary | ICD-10-CM | POA: Diagnosis not present

## 2016-10-13 DIAGNOSIS — L603 Nail dystrophy: Secondary | ICD-10-CM | POA: Diagnosis not present

## 2016-10-14 ENCOUNTER — Telehealth: Payer: Self-pay | Admitting: Obstetrics & Gynecology

## 2016-10-14 NOTE — Addendum Note (Signed)
Addended by: Honor Loh on: 10/14/2016 08:39 AM   Modules accepted: Orders

## 2016-10-14 NOTE — Telephone Encounter (Signed)
Patient is on antibiotics for foot surgery and would like a prescription sent to her pharmacy to have for a yeast infection. [facility] on [address] at [phone]

## 2016-10-14 NOTE — Telephone Encounter (Signed)
Dr.Miller, okay to send in rx for Diflucan 150 mg po x 1 repeat in 72 hours if symptoms persist #2 0RF?

## 2016-10-15 ENCOUNTER — Other Ambulatory Visit: Payer: Medicare Other

## 2016-10-15 ENCOUNTER — Ambulatory Visit: Payer: Self-pay | Admitting: Obstetrics & Gynecology

## 2016-10-15 MED ORDER — FLUCONAZOLE 150 MG PO TABS
150.0000 mg | ORAL_TABLET | Freq: Once | ORAL | 0 refills | Status: AC
Start: 2016-10-15 — End: 2016-10-15

## 2016-10-15 NOTE — Telephone Encounter (Signed)
Patient calling again regarding previous request.

## 2016-10-15 NOTE — Telephone Encounter (Signed)
Patient is calling to check on her request for medication for a yeast infection.

## 2016-10-15 NOTE — Telephone Encounter (Signed)
Rx sent for Diflican 481 mg po once repeat in 72 hours if symptoms persist #2 0RF sent to pharmacy on file. Husband notified rx has been sent as he came to the office to discuss medication this afternoon, okay to talk to husband per ROI.  Routing to provider for final review. Patient agreeable to disposition. Will close encounter.

## 2016-10-21 ENCOUNTER — Ambulatory Visit: Payer: Medicare Other | Admitting: Gastroenterology

## 2016-10-21 ENCOUNTER — Other Ambulatory Visit (INDEPENDENT_AMBULATORY_CARE_PROVIDER_SITE_OTHER): Payer: Medicare Other

## 2016-10-21 DIAGNOSIS — D72829 Elevated white blood cell count, unspecified: Secondary | ICD-10-CM

## 2016-10-21 DIAGNOSIS — E538 Deficiency of other specified B group vitamins: Secondary | ICD-10-CM

## 2016-10-21 DIAGNOSIS — E119 Type 2 diabetes mellitus without complications: Secondary | ICD-10-CM

## 2016-10-21 LAB — CBC WITH DIFFERENTIAL/PLATELET
BASOS ABS: 0.1 10*3/uL (ref 0.0–0.1)
Basophils Relative: 0.8 % (ref 0.0–3.0)
Eosinophils Absolute: 0.3 10*3/uL (ref 0.0–0.7)
Eosinophils Relative: 3.8 % (ref 0.0–5.0)
HEMATOCRIT: 41.6 % (ref 36.0–46.0)
HEMOGLOBIN: 13.5 g/dL (ref 12.0–15.0)
LYMPHS PCT: 18.1 % (ref 12.0–46.0)
Lymphs Abs: 1.4 10*3/uL (ref 0.7–4.0)
MCHC: 32.5 g/dL (ref 30.0–36.0)
MCV: 90.7 fl (ref 78.0–100.0)
MONOS PCT: 7.2 % (ref 3.0–12.0)
Monocytes Absolute: 0.6 10*3/uL (ref 0.1–1.0)
NEUTROS ABS: 5.6 10*3/uL (ref 1.4–7.7)
Neutrophils Relative %: 70.1 % (ref 43.0–77.0)
PLATELETS: 287 10*3/uL (ref 150.0–400.0)
RBC: 4.59 Mil/uL (ref 3.87–5.11)
RDW: 14.1 % (ref 11.5–15.5)
WBC: 7.9 10*3/uL (ref 4.0–10.5)

## 2016-10-21 LAB — HEMOGLOBIN A1C: Hgb A1c MFr Bld: 6.9 % — ABNORMAL HIGH (ref 4.6–6.5)

## 2016-10-21 LAB — VITAMIN B12: Vitamin B-12: 286 pg/mL (ref 211–911)

## 2016-10-27 DIAGNOSIS — M71572 Other bursitis, not elsewhere classified, left ankle and foot: Secondary | ICD-10-CM | POA: Diagnosis not present

## 2016-10-27 DIAGNOSIS — M2042 Other hammer toe(s) (acquired), left foot: Secondary | ICD-10-CM | POA: Diagnosis not present

## 2016-10-29 ENCOUNTER — Ambulatory Visit (INDEPENDENT_AMBULATORY_CARE_PROVIDER_SITE_OTHER): Payer: Medicare Other | Admitting: Obstetrics & Gynecology

## 2016-10-29 VITALS — BP 122/60 | HR 88 | Resp 16 | Ht 72.0 in | Wt 268.0 lb

## 2016-10-29 DIAGNOSIS — N898 Other specified noninflammatory disorders of vagina: Secondary | ICD-10-CM

## 2016-10-29 MED ORDER — FLUCONAZOLE 150 MG PO TABS: 150.0000 mg | ORAL_TABLET | Freq: Once | ORAL | 0 refills | Status: AC

## 2016-10-29 NOTE — Progress Notes (Signed)
GYNECOLOGY  VISIT   HPI: 76 y.o. G0P0 Married Caucasian female here for removal of her Estring before knee replacement.  She's had this current estring for over four months.  She is complaining of vaginal discharge and irritation that feels like a yeast infection to her.  She denies vaginal bleeding or pelvic pain.  She is having some urinary incontinence that is worsening.  She doesn't really want to investigate this right now as her biggest issue is the upcoming knee surgery and being prepared for this.  I have advised to have the [place] removed prior to surgery as she is unsure how long it will before she will be able to have it replaced and I didn't feel like it was a good idea to have this foreign body present for months without any hormonal benefit.  GYNECOLOGIC HISTORY: Patient's last menstrual period was 05/10/1992. Contraception: post menopausal Menopausal hormone therapy: estring  Patient Active Problem List   Diagnosis Date Noted  . Arthritis of midfoot 03/03/2016  . Hammertoes of both feet 03/03/2016  . Aortic atherosclerosis ([place]) 02/16/2016  . Solitary pulmonary nodule 01/08/2016  . 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  . Bronchiectasis without acute exacerbation ([place]) 10/18/2015  . Acute diverticulitis 10/05/2015  . IBS (irritable bowel syndrome) 07/17/2014  . CKD (chronic kidney disease), stage II 01/22/2014  . Depression 01/03/2014  . Posterior tibial tendon dysfunction 01/03/2014  . Osteoarthritis, knee 01/03/2014  . Glaucoma 01/03/2014  . Essential hypertension, benign 01/03/2014  . Obesity (BMI 30-39.9) 06/15/2013  . Foot tendinitis 07/20/2010  . INSOMNIA, CHRONIC 07/25/2009  . Fatty liver 03/18/2009  . GERD 12/25/2007  . Hyperlipidemia 07/26/2007  . ANEMIA, B12 DEFICIENCY 12/29/2006  . Diabetes mellitus type II, controlled ([place]) 12/28/2006  . RESTLESS LEG SYNDROME, MILD 12/28/2006  . NEUROPATHY, IDIOPATHIC PERIPHERAL NEC 12/28/2006  . OSTEOPOROSIS 12/12/2006  . BREAST  CANCER, HX OF 12/12/2006    Past Medical History:  Diagnosis Date  . Arthritis    right knee;injections every 5months   . Asthma   . Cancer (HCC)    HX BREAST CANCER  . COMPRESSION FRACTURE, LUMBAR VERTEBRAE 08/21/2008   Qualifier: Diagnosis of  By: Arnoldo Morale MD, Balinda Quails   . Depression   . Diabetes mellitus    takes Amaryl and Januvia daily  . Difficulty sleeping   . Diverticulitis   . Early cataracts, bilateral   . Eczema   . Endometriosis   . Gastritis   . Glaucoma   . Hammer toe   . Headache(784.0)    Migraines  . Hypertension    takes Atenolol daily  . IBS (irritable bowel syndrome)   . Joint pain   . Kidney stone   . Neuropathy   . Open wound of second toe of left foot   . Osteomyelitis ([place])    left 2nd toe  . Osteopenia   . PONV (postoperative nausea and vomiting)   . Posterior tibial tendon dysfunction    left foot  . Scoliosis   . 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 Vitamin D deficiency    takes Vit d daily    Past Surgical History:  Procedure Laterality Date  . ADENOIDECTOMY     at age 13  . biopsy on back     benign  . CATARACT EXTRACTION    . CHOLECYSTECTOMY  06/2008  . COLONOSCOPY  11/02/05  . DILATION AND CURETTAGE OF UTERUS    . ENDOMETRIAL BIOPSY  06/22/2011  benign polyp, no hyperplasia  . excision on breast     internal infected suture from breast surgery  . excision removed from neck     infected lymph node  . FOOT SURGERY     left foot-cleaning out areas on toes at the wound center-having MRI to determine if osteomyelitis  . HYSTEROSCOPY  06/22/11   PMB submucosal myoma  . JOINT REPLACEMENT     left total shoulder  . LAPAROSCOPIC OOPHORECTOMY Right 12/2004   absent LSO  . LAPAROTOMY    . MASTECTOMY Bilateral 1994 right, 1995 left  . SHOULDER HEMI-ARTHROPLASTY  01/01/2012   Procedure: SHOULDER HEMI-ARTHROPLASTY;  Surgeon: Roseanne Kaufman, MD;  Location: [facility];  Service: Orthopedics;  Laterality:  Left;  Left Shoulder Hemi Arthroplasty with Repair and Reconstruction as Necessary   . THYROIDECTOMY     71yrs ago. follows endocrine  . TONSILLECTOMY    . TOTAL KNEE ARTHROPLASTY Right 12/30/2014   Procedure: RIGHT TOTAL KNEE ARTHROPLASTY;  Surgeon: Gaynelle Arabian, MD;  [facility];  Service: Orthopedics;  Laterality: Right;    MEDS:  Reviewed in EPIC and UTD  ALLERGIES: Cephalexin; Erythromycin ethylsuccinate; Levaquin [levofloxacin in d5w]; Nitrofurantoin; and Percocet [oxycodone-acetaminophen]  Family History  Problem Relation Age of Onset  . Arthritis Mother   . COPD Father   . Heart disease Father        MI 65  . Hypertension Father   . Hyperlipidemia Father   . Colon cancer Neg Hx   . Esophageal cancer Neg Hx   . Rectal cancer Neg Hx   . Stomach cancer Neg Hx    SH:  Married, non smoker  Review of Systems  All other systems reviewed and are negative.   PHYSICAL EXAMINATION:    BP 122/60 (BP Location: Right Arm, Patient Position: Sitting, Cuff Size: Large)   Pulse 88   Resp 16   Ht 6' (1.829 m)   Wt 268 lb (121.6 kg)   LMP 05/10/1992   BMI 36.35 kg/m     General appearance: alert, cooperative and appears stated age  Pelvic: External genitalia:  no lesions              Urethra:  normal appearing urethra with no masses, tenderness or lesions              Bartholins and Skenes: normal                 Vagina: erythema and whitish discharge present, Estring removed and NOT replaced              Cervix: no lesions              Bimanual Exam:  Uterus:  normal size, contour, position, consistency, mobility, non-tender              Adnexa: no mass, fullness, tenderness              Anus:  no lesions  Chaperone was present for exam.  Assessment: H/o atrophic vaginitis Possible yeast vaginitis Estring removal today  Plan:  Diflucan 150mg  po x 1, repeat 72 hours Estring removed.  Pt will call when ready for replacement Vaginitis DNA probe pending

## 2016-10-30 LAB — VAGINITIS/VAGINOSIS, DNA PROBE
CANDIDA SPECIES: NEGATIVE
GARDNERELLA VAGINALIS: NEGATIVE
TRICHOMONAS VAG: NEGATIVE

## 2016-11-01 ENCOUNTER — Encounter: Payer: Self-pay | Admitting: Obstetrics & Gynecology

## 2016-11-02 ENCOUNTER — Telehealth: Payer: Self-pay

## 2016-11-02 NOTE — Telephone Encounter (Signed)
-----   Message from Megan Salon, MD sent at 11/01/2016 11:55 PM EDT ----- Please let pt know the testing done in office on Friday was negative for yeast or BV.  I think the discharge was due to the [place] being overdue for removal and replacement.  Please see if symptoms are improved.  What day is her surgery this week (knee replacement)?  Thanks.

## 2016-11-02 NOTE — Telephone Encounter (Signed)
Spoke with patient. Advised of results as seen below from [place]. Patient is agreeable and verbalizes understanding. Reports she is still experiencing a vaginal odor, but is not having any additional symptoms at this time. Patient's knee replacement is scheduled for 11/29/2016.

## 2016-11-04 NOTE — Telephone Encounter (Signed)
As vaginitis testing was negative, I would not recommend treating with anything at this time.  Encounter closed.

## 2016-11-05 ENCOUNTER — Other Ambulatory Visit: Payer: Self-pay | Admitting: Family Medicine

## 2016-11-08 NOTE — Progress Notes (Signed)
Please place orders in EPIC as patient is being scheduled for a pre-op appointment! Thank you! 

## 2016-11-11 DIAGNOSIS — D518 Other vitamin B12 deficiency anemias: Secondary | ICD-10-CM | POA: Diagnosis not present

## 2016-11-16 ENCOUNTER — Ambulatory Visit: Payer: Self-pay | Admitting: Orthopedic Surgery

## 2016-11-22 ENCOUNTER — Other Ambulatory Visit (HOSPITAL_COMMUNITY): Payer: Self-pay | Admitting: Emergency Medicine

## 2016-11-22 NOTE — Progress Notes (Signed)
[facility] cardiology clearance Dr Marlou Porch 09-06-16 epic  EKG 09-06-16 epic  Stress test 09-07-16 epic reads "Normal resting and stress perfusion. No ischemia or infarction EF Small LV cavity EF 90%  She may proceed with surgery. Low risk stress test. Candee Furbish, MD"  HgA1c 10-21-16 epic  CT chest 12-04-15 epic

## 2016-11-22 NOTE — Patient Instructions (Signed)
[name]  11/22/2016   Your procedure is scheduled on: 11-29-16  Report to [facility] Main  Entrance    Report to admitting at [facility]   Call this number if you have problems the morning of surgery [phone]   Remember: ONLY 1 PERSON MAY GO WITH YOU TO SHORT STAY TO GET  READY MORNING OF YOUR SURGERY.  Do not eat food or drink liquids :After Midnight.     Take these medicines the morning of surgery with A SIP OF WATER: atenolol(tenormin), fluoxetine(prozac), cetirizine, eye drops, inhaler as needed (may bring to hospital)                                  You may not have any metal on your body including hair pins and              piercings  Do not wear jewelry, make-up, lotions, powders or perfumes, deodorant             Do not wear nail polish.  Do not shave  48 hours prior to surgery.              Do not bring valuables to the hospital. [facility].  Contacts, dentures or bridgework may not be worn into surgery.  Leave suitcase in the car. After surgery it may be brought to your room.                Please read over the following fact sheets you were given: _____________________________________________________________________           How to Manage Your Diabetes Before and After Surgery  Why is it important to control my blood sugar before and after surgery? . Improving blood sugar levels before and after surgery helps healing and can limit problems. . A way of improving blood sugar control is eating a healthy diet by: o  Eating less sugar and carbohydrates o  Increasing activity/exercise o  Talking with your doctor about reaching your blood sugar goals . High blood sugars (greater than 180 mg/dL) can raise your risk of infections and slow your recovery, so you will need to focus on controlling your diabetes during the weeks before surgery. . Make sure that the doctor who takes care of your  diabetes knows about your planned surgery including the date and location.  How do I manage my blood sugar before surgery? . Check your blood sugar at least 4 times a day, starting 2 days before surgery, to make sure that the level is not too high or low. o Check your blood sugar the morning of your surgery when you wake up and every 2 hours until you get to the Short Stay unit. . If your blood sugar is less than 70 mg/dL, you will need to treat for low blood sugar: o Do not take insulin. o Treat a low blood sugar (less than 70 mg/dL) with  cup of clear juice (cranberry or apple), 4 glucose tablets, OR glucose gel. o Recheck blood sugar in 15 minutes after treatment (to make sure it is greater than 70 mg/dL). If your blood sugar is not greater than 70 mg/dL on recheck, call [phone] for further instructions. . Report  your blood sugar to the short stay nurse when you get to Short Stay.  . If you are admitted to the hospital after surgery: o Your blood sugar will be checked by the staff and you will probably be given insulin after surgery (instead of oral diabetes medicines) to make sure you have good blood sugar levels. o The goal for blood sugar control after surgery is 80-180 mg/dL.   WHAT DO I DO ABOUT MY DIABETES MEDICATION?   . THE DAY BEFORE SURGERY 11-28-16 ,    Take your GLIMEPIRIDE as normal     Take you VICTOZA as normal    . THE MORNING OF SURGERY 11-29-16,   DO NOT TAKE ANY MEDICATIONS FOR DIABETES   Patient Signature:  Date:   Nurse Signature:  Date:   Reviewed and Endorsed by [facility] Patient Education Committee, August 2015  [facility] - Preparing for Surgery Before surgery, you can play an important role.  Because skin is not sterile, your skin needs to be as free of germs as possible.  You can reduce the number of germs on your skin by washing with CHG (chlorahexidine gluconate) soap before surgery.  CHG is an antiseptic cleaner which kills germs and bonds  with the skin to continue killing germs even after washing. Please DO NOT use if you have an allergy to CHG or antibacterial soaps.  If your skin becomes reddened/irritated stop using the CHG and inform your nurse when you arrive at Short Stay. Do not shave (including legs and underarms) for at least 48 hours prior to the first CHG shower.  You may shave your face/neck. Please follow these instructions carefully:  1.  Shower with CHG Soap the night before surgery and the  morning of Surgery.  2.  If you choose to wash your hair, wash your hair first as usual with your  normal  shampoo.  3.  After you shampoo, rinse your hair and body thoroughly to remove the  shampoo.                           4.  Use CHG as you would any other liquid soap.  You can apply chg directly  to the skin and wash                       Gently with a scrungie or clean washcloth.  5.  Apply the CHG Soap to your body ONLY FROM THE NECK DOWN.   Do not use on face/ open                           Wound or open sores. Avoid contact with eyes, ears mouth and genitals (private parts).                       Wash face,  Genitals (private parts) with your normal soap.             6.  Wash thoroughly, paying special attention to the area where your surgery  will be performed.  7.  Thoroughly rinse your body with warm water from the neck down.  8.  DO NOT shower/wash with your normal soap after using and rinsing off  the CHG Soap.                9.  Pat yourself dry with a clean  towel.            10.  Wear clean pajamas.            11.  Place clean sheets on your bed the night of your first shower and do not  sleep with pets. Day of Surgery : Do not apply any lotions/deodorants the morning of surgery.  Please wear clean clothes to the hospital/surgery center.  FAILURE TO FOLLOW THESE INSTRUCTIONS MAY RESULT IN THE CANCELLATION OF YOUR SURGERY PATIENT SIGNATURE_________________________________  NURSE  SIGNATURE__________________________________  ________________________________________________________________________   Adam Phenix  An incentive spirometer is a tool that can help keep your lungs clear and active. This tool measures how well you are filling your lungs with each breath. Taking long deep breaths may help reverse or decrease the chance of developing breathing (pulmonary) problems (especially infection) following:  A long period of time when you are unable to move or be active. BEFORE THE PROCEDURE   If the spirometer includes an indicator to show your best effort, your nurse or respiratory therapist will set it to a desired goal.  If possible, sit up straight or lean slightly forward. Try not to slouch.  Hold the incentive spirometer in an upright position. INSTRUCTIONS FOR USE  1. Sit on the edge of your bed if possible, or sit up as far as you can in bed or on a chair. 2. Hold the incentive spirometer in an upright position. 3. Breathe out normally. 4. Place the mouthpiece in your mouth and seal your lips tightly around it. 5. Breathe in slowly and as deeply as possible, raising the piston or the ball toward the top of the column. 6. Hold your breath for 3-5 seconds or for as long as possible. Allow the piston or ball to fall to the bottom of the column. 7. Remove the mouthpiece from your mouth and breathe out normally. 8. Rest for a few seconds and repeat Steps 1 through 7 at least 10 times every 1-2 hours when you are awake. Take your time and take a few normal breaths between deep breaths. 9. The spirometer may include an indicator to show your best effort. Use the indicator as a goal to work toward during each repetition. 10. After each set of 10 deep breaths, practice coughing to be sure your lungs are clear. If you have an incision (the cut made at the time of surgery), support your incision when coughing by placing a pillow or rolled up towels firmly  against it. Once you are able to get out of bed, walk around indoors and cough well. You may stop using the incentive spirometer when instructed by your caregiver.  RISKS AND COMPLICATIONS  Take your time so you do not get dizzy or light-headed.  If you are in pain, you may need to take or ask for pain medication before doing incentive spirometry. It is harder to take a deep breath if you are having pain. AFTER USE  Rest and breathe slowly and easily.  It can be helpful to keep track of a log of your progress. Your caregiver can provide you with a simple table to help with this. If you are using the spirometer at home, follow these instructions: [place] IF:   You are having difficultly using the spirometer.  You have trouble using the spirometer as often as instructed.  Your pain medication is not giving enough relief while using the spirometer.  You develop fever of 100.5 F (38.1 C) or  higher. SEEK IMMEDIATE MEDICAL CARE IF:   You cough up bloody sputum that had not been present before.  You develop fever of 102 F (38.9 C) or greater.  You develop worsening pain at or near the incision site. MAKE SURE YOU:   Understand these instructions.  Will watch your condition.  Will get help right away if you are not doing well or get worse. Document Released: 09/06/2006 Document Revised: 07/19/2011 Document Reviewed: 11/07/2006 ExitCare Patient Information 2014 Exit[employer], [place].   ________________________________________________________________________  WHAT IS A BLOOD TRANSFUSION? Blood Transfusion Information  A transfusion is the replacement of blood or some of its parts. Blood is made up of multiple cells which provide different functions.  Red blood cells carry oxygen and are used for blood loss replacement.  White blood cells fight against infection.  Platelets control bleeding.  Plasma helps clot blood.  Other blood products are available for  specialized needs, such as hemophilia or other clotting disorders. BEFORE THE TRANSFUSION  Who gives blood for transfusions?   Healthy volunteers who are fully evaluated to make sure their blood is safe. This is blood bank blood. Transfusion therapy is the safest it has ever been in the practice of medicine. Before blood is taken from a donor, a complete history is taken to make sure that person has no history of diseases nor engages in risky social behavior (examples are intravenous drug use or sexual activity with multiple partners). The donor's travel history is screened to minimize risk of transmitting infections, such as malaria. The donated blood is tested for signs of infectious diseases, such as HIV and hepatitis. The blood is then tested to be sure it is compatible with you in order to minimize the chance of a transfusion reaction. If you or a relative donates blood, this is often done in anticipation of surgery and is not appropriate for emergency situations. It takes many days to process the donated blood. RISKS AND COMPLICATIONS Although transfusion therapy is very safe and saves many lives, the main dangers of transfusion include:   Getting an infectious disease.  Developing a transfusion reaction. This is an allergic reaction to something in the blood you were given. Every precaution is taken to prevent this. The decision to have a blood transfusion has been considered carefully by your caregiver before blood is given. Blood is not given unless the benefits outweigh the risks. AFTER THE TRANSFUSION  Right after receiving a blood transfusion, you will usually feel much better and more energetic. This is especially true if your red blood cells have gotten low (anemic). The transfusion raises the level of the red blood cells which carry oxygen, and this usually causes an energy increase.  The nurse administering the transfusion will monitor you carefully for complications. HOME CARE  INSTRUCTIONS  No special instructions are needed after a transfusion. You may find your energy is better. Speak with your caregiver about any limitations on activity for underlying diseases you may have. SEEK MEDICAL CARE IF:   Your condition is not improving after your transfusion.  You develop redness or irritation at the intravenous (IV) site. SEEK IMMEDIATE MEDICAL CARE IF:  Any of the following symptoms occur over the next 12 hours:  Shaking chills.  You have a temperature by mouth above 102 F (38.9 C), not controlled by medicine.  Chest, back, or muscle pain.  People around you feel you are not acting correctly or are confused.  Shortness of breath or difficulty breathing.  Dizziness and fainting.  You get a rash or develop hives.  You have a decrease in urine output.  Your urine turns a dark color or changes to pink, red, or Cast[place]. Any of the following symptoms occur over the next 10 days:  You have a temperature by mouth above 102 F (38.9 C), not controlled by medicine.  Shortness of breath.  Weakness after normal activity.  The white part of the eye turns yellow (jaundice).  You have a decrease in the amount of urine or are urinating less often.  Your urine turns a dark color or changes to pink, red, or [place]. Document Released: 04/23/2000 Document Revised: 07/19/2011 Document Reviewed: 12/11/2007 [facility] Patient Information 2014 [place], [place].  _______________________________________________________________________

## 2016-11-24 ENCOUNTER — Encounter (HOSPITAL_COMMUNITY): Payer: Self-pay

## 2016-11-24 ENCOUNTER — Encounter (HOSPITAL_COMMUNITY)
Admission: RE | Admit: 2016-11-24 | Discharge: 2016-11-24 | Disposition: A | Discharge status: Home / Self Care | Payer: Medicare Other | Source: Ambulatory Visit | Attending: Orthopedic Surgery | Admitting: Orthopedic Surgery

## 2016-11-24 DIAGNOSIS — Z01812 Encounter for preprocedural laboratory examination: Secondary | ICD-10-CM | POA: Insufficient documentation

## 2016-11-24 DIAGNOSIS — M1712 Unilateral primary osteoarthritis, left knee: Secondary | ICD-10-CM | POA: Diagnosis not present

## 2016-11-24 DIAGNOSIS — E119 Type 2 diabetes mellitus without complications: Secondary | ICD-10-CM | POA: Diagnosis not present

## 2016-11-24 HISTORY — DX: Congenital malformation of esophagus, unspecified: Q39.9

## 2016-11-24 HISTORY — DX: Bronchiectasis, uncomplicated: J47.9

## 2016-11-24 HISTORY — DX: Other congenital malformations of esophagus: Q39.8

## 2016-11-24 HISTORY — DX: Other specified disease of esophagus: K22.89

## 2016-11-24 LAB — COMPREHENSIVE METABOLIC PANEL
ALBUMIN: 3.7 g/dL (ref 3.5–5.0)
ALK PHOS: 75 U/L (ref 38–126)
ALT: 19 U/L (ref 14–54)
AST: 19 U/L (ref 15–41)
Anion gap: 8 (ref 5–15)
BUN: 16 mg/dL (ref 6–20)
CALCIUM: 9.2 mg/dL (ref 8.9–10.3)
CO2: 29 mmol/L (ref 22–32)
CREATININE: 0.67 mg/dL (ref 0.44–1.00)
Chloride: 105 mmol/L (ref 101–111)
GFR calc non Af Amer: >60 mL/min (ref >60)
GLUCOSE: 105 mg/dL — AB (ref 65–99)
Potassium: 4.4 mmol/L (ref 3.5–5.1)
SODIUM: 142 mmol/L (ref 135–145)
Total Bilirubin: 0.5 mg/dL (ref 0.3–1.2)
Total Protein: 6.9 g/dL (ref 6.5–8.1)

## 2016-11-24 LAB — CBC
HCT: 38.9 % (ref 36.0–46.0)
HEMOGLOBIN: 12.7 g/dL (ref 12.0–15.0)
MCH: 29.5 pg (ref 26.0–34.0)
MCHC: 32.6 g/dL (ref 30.0–36.0)
MCV: 90.5 fL (ref 78.0–100.0)
Platelets: 237 10*3/uL (ref 150–400)
RBC: 4.3 MIL/uL (ref 3.87–5.11)
RDW: 14 % (ref 11.5–15.5)
WBC: 8.9 10*3/uL (ref 4.0–10.5)

## 2016-11-24 LAB — GLUCOSE, CAPILLARY: GLUCOSE-CAPILLARY: 107 mg/dL — AB (ref 65–99)

## 2016-11-24 LAB — PROTIME-INR
INR: 0.98
Prothrombin Time: 13 seconds (ref 11.4–15.2)

## 2016-11-24 LAB — APTT: aPTT: 27 seconds (ref 24–36)

## 2016-11-26 ENCOUNTER — Other Ambulatory Visit (HOSPITAL_COMMUNITY): Payer: Self-pay | Admitting: Emergency Medicine

## 2016-11-26 NOTE — Progress Notes (Addendum)
Pulmonology clearance Dr Lake Bells on chart ; recommends "OOB soon, flutter valve BID, incentive spirometry"

## 2016-11-26 NOTE — Progress Notes (Signed)
Surgical PCR collected at pre-op appt. RN noticed no results in epic greater than 24 hours after collection.  RN called down to lab and was told that they could not find any specimen for that patient, although specimen was delivered personally by RN and given to short blonde woman in a lab coat that had other specimens in her hand  (did not get her name ). RN inquired,  if lab had seen the order and supposedly no specimen was received, why lab did not call to investigate as they are known to do quite often for over 24 hours .RN was told maybe it was missed. RN will place new order for PCR to be done D.O.S

## 2016-11-27 ENCOUNTER — Ambulatory Visit: Payer: Self-pay | Admitting: Orthopedic Surgery

## 2016-11-27 NOTE — H&P (Signed)
[name] DOB: Sep 26, 1940 Married / Language: English / Race: White Female Date of Admission:  11/29/2016 CC:  Left knee pain History of Present Illness The patient is a 76 year old female who comes in  for a preoperative History and Physical. The patient is scheduled for a left total knee arthroplasty to be performed by Dr. Dione Hahn. Aluisio, MD at [facility] on 11/29/2016. The patient is a 76 year old female who presented for follow up of their knee. The patient is being followed for their left knee pain and osteoarthritis. They are now month(s) out from s/p aspiration of baker cyst (Dr [name]). Symptoms reported include: pain, stiffness, giving way, instability, pain with weightbearing and difficulty ambulating. She reported her pain level to be mild. The following medication has been used for pain control: none. [name] unfortunately has had progressively worsening problems with her left knee. It is getting much harder for her to get around now. The knee hurts with all activities. It is limiting what she can and cannot do. She also has significant posterior tibial tendon insufficiency of that left foot and ankle. Radiographs were reviewed, AP and lateral of left knee. She has got bone-on-bone arthritis, lateral and patellofemoral with some medial narrowing also. She has got advanced arthritic change. Injection is no longer beneficial. At this point, the most predictable means of improving pain and function is total knee arthroplasty. The procedure, risks, potential complications and rehab course are discussed in detail and the patient elects to proceed. She is very familiar from having the other side done. They have been treated conservatively in the past for the above stated problem and despite conservative measures, they continue to have progressive pain and severe functional limitations and dysfunction. They have failed non-operative management including home exercise,  medications, and injections. It is felt that they would benefit from undergoing total joint replacement. Risks and benefits of the procedure have been discussed with the patient and they elect to proceed with surgery. There are no active contraindications to surgery such as ongoing infection or rapidly progressive neurological disease.   Problem List/Past Medical  Hammer toe (M20.40)  Fracture, humerus, head (S42.293A)  Foot infection (L08.9)  Flat foot (M21.40)  Status post total right knee replacement (W10.932)  Synovial cyst of popliteal space, left (M71.22)  Tibialis tendonitis (T55.732)  Breast Cancer  Hypertension  Diabetes Mellitus, Type I  Peripheral Neuropathy  Migraine Headache  Hyperthyroidism  High blood pressure  Osteoarthritis  Diabetes Mellitus, Type II  Primary localized osteoarthritis of right knee (M17.11)  Migraine Headache  Anxiety Disorder  Depression  Glaucoma  Cataract  Bronchiectasis  Hemorrhoids  Irritable bowel syndrome  Diverticulosis  Urinary Incontinence  Kidney Stone  Cystitis  Obesity  Carotid Artery Stenosis  Measles  Mumps  Eczema  B12 Deficiency Anemia  Insomnia  Restless Leg Syndrome  Neuropathic Idiopathic Peripheral Neuropathy  Gastroesophageal Reflux Disease  Fatty Liver  Chronic kidney disease  Stage II Diverticulitis Of Colon  Solitary Pulmonary Nodule  Aortic Atherosclerosis  Vitamin D Deficiency   Allergies Macrodantin *URINARY ANTI-INFECTIVES*  Headache. Erythromycin *MACROLIDES*  Diarrhea, Hives. Keflex *CEPHALOSPORINS*  Diarrhea. Levaquin *FLUOROQUINOLONES*  Tendon Issues OxyCODONE HCl *ANALGESICS - OPIOID*  Itching.  Family History  Rheumatoid Arthritis  Mother. mother Osteoporosis  mother Heart Disease  father Chronic Obstructive Lung Disease  father Osteoarthritis  father Father  Deceased. age 34 Mother  Deceased. age 41  Social History  Pain  Contract  no Tobacco use  Never smoker. never smoker Number of flights of stairs before winded  greater than 5 Marital status  married No alcohol use  Drug/Alcohol Rehab (Previously)  no Drug/Alcohol Rehab (Currently)  no Current work status  retired Living situation  live with spouse Illicit drug use  no Exercise  Exercises rarely Children  1 Alcohol use  never consumed alcohol Post-Surgical Plans  Home With Caregiver, Home with [place]. Advance Directives  Living Will, Healthcare Power of [place].  Medication History  FreeStyle Lancets Active. PROzac (20MG  Capsule, Oral) Active. Temazepam (15MG  Capsule, Oral) Active. Mirapex (0.5MG  Tablet, Oral) Active. Estring (2MG  Ring, Vaginal) Active. Symbicort (160-4.5MCG/ACT Aerosol, Inhalation) Active. Atenolol (25MG  Tablet, Oral) Active. Amaryl (4MG  Tablet, Oral) Active. Victoza (Subcutaneous) Specific strength unknown - Active. Lisinopril (20MG  Tablet, Oral) Active. Vitamin B12 (1000MCG Tablet ER, Oral) Active. Diprolene (0.05% Cream, External) Active. Isometheptene-Caffeine-APAP (65-20-325MG  Tablet, Oral) Active. Fish Oil (Oral) Specific strength unknown - Active. Flax Seed Oil (Oral) Specific strength unknown - Active. Lumigan (0.03% Solution, Ophthalmic) Active. Cosopt (22.3-6.8MG /ML Solution, Ophthalmic) Active. Citrucel (500MG  Tablet, Oral) Active.  Past Surgical History Dilation and Curettage of Uterus  Hysterectomy  partial (non-cancerous) Gallbladder Surgery  laporoscopic Mastectomy - Both  Mastectomy - Bilateral  bilateral Total Knee Replacement - Right  Cholecystectomy  Tonsillectomy  Thyroidectomy; Total  Ovary Removal - Both  Laparotomy  Adenoidectomy  Colonoscopy  Shoulder Hemiarthroplasty  Foot Surgery  Cataract removal    Review of Systems General Present- Tiredness and Weight Gain. Not Present- Chills, Fatigue, Fever, Memory Loss,  Night Sweats and Weight Loss. Skin Present- Eczema and Hair Loss. Not Present- Hives, Itching, Lesions and Rash. HEENT Present- Glaucoma and Headache. Not Present- Dentures, Double Vision, Hearing Loss, Tinnitus and Visual Loss. Respiratory Present- Shortness of breath, Shortness of breath with exertion and Wheezing. Not Present- Allergies, Chronic Cough, Coughing up blood and Shortness of breath at rest. Cardiovascular Present- Hypertension. Not Present- Chest Pain, Difficulty Breathing Lying Down, Murmur, Palpitations, Racing/skipping heartbeats and Swelling. Gastrointestinal Present- Bloating, Diarrhea and Hemorrhoids. Not Present- Abdominal Pain, Bloody Stool, Constipation, Difficulty Swallowing, Heartburn, Jaundice, Loss of appetitie, Nausea and Vomiting. Female Genitourinary Present- Loss of bladder control. Not Present- Blood in Urine, Discharge, Flank Pain, Incontinence, Painful Urination, Urgency, Urinary frequency, Urinary Retention, Urinating at Night and Weak urinary stream. Musculoskeletal Present- Joint Pain. Not Present- Back Pain, Joint Swelling, Morning Stiffness, Muscle Pain, Muscle Weakness and Spasms. Neurological Not Present- Blackout spells, Difficulty with balance, Dizziness, Paralysis, Tremor and Weakness. Psychiatric Present- Anxiety and Depression. Not Present- Insomnia. Hematology Present- Anemia.  Vitals  Weight: 259 lb Height: 72in Body Surface Area: 2.38 m Body Mass Index: 35.13 kg/m  Pulse: 84 (Regular)  BP: 128/72 (Sitting, Right Arm, Standard)   Physical Exam  General Mental Status -Alert, cooperative and good historian. General Appearance-pleasant, Not in acute distress. Orientation-Oriented X3. Build & Nutrition-Well nourished and Well developed.  Head and Neck Head-normocephalic, atraumatic . Neck Global Assessment - supple, no bruit auscultated on the right, no bruit auscultated on the left.  Eye Pupil - Bilateral-Regular and  Round. Motion - Bilateral-EOMI.  Chest and Lung Exam Auscultation Breath sounds - clear at anterior chest wall and clear at posterior chest wall. Adventitious sounds - No Adventitious sounds.  Cardiovascular Auscultation Rhythm - Regular rate and rhythm. Heart Sounds - S1 WNL and S2 WNL. Murmurs & Other Heart Sounds - Auscultation of the heart reveals - No Murmurs.  Abdomen Palpation/Percussion Tenderness - Abdomen is non-tender to palpation. Rigidity (guarding) - Abdomen  is soft. Auscultation Auscultation of the abdomen reveals - Bowel sounds normal.  Female Genitourinary Note: Not done, not pertinent to present illness   Musculoskeletal Note: On exam, she is in no distress. Her left knee shows no effusion. Range is about 5 to 130. She is tender lateral and medial. There is marked crepitus on range of motion with no instability. Her replaced knee on the right has range 0 to 125 and no tenderness or instability.  DIAGNOSTIC IMAGING Radiographs were reviewed, AP and lateral of left knee. She has got bone-on-bone arthritis, lateral and patellofemoral with some medial narrowing also.   Assessment & Plan Status post total right knee replacement (N19.166) Primary osteoarthritis of left knee (M17.12)  Note:Surgical Plans: Left Total Knee Replacement  Disposition: Home, In-Home [facility]  PCP: Dr. Ansel Bong Cards: Dr. Derl Barrow Pulm: Dr. Lake Bells  Topical TXA - History of Breast Cancer  Anesthesia Issues: No  Patient was instructed on what medications to stop prior to surgery.  Signed electronically by Joelene Millin, III PA-C

## 2016-11-28 MED ORDER — DEXTROSE 5 % IV SOLN
3.0000 g | INTRAVENOUS | Status: AC
  Administered 2016-11-29: 3 g via INTRAVENOUS
  Filled 2016-11-28: qty 3000
  Filled 2016-11-28: qty 3

## 2016-11-29 ENCOUNTER — Inpatient Hospital Stay (HOSPITAL_COMMUNITY): Payer: Medicare Other | Admitting: Anesthesiology

## 2016-11-29 ENCOUNTER — Encounter (HOSPITAL_COMMUNITY)
Admission: RE | Disposition: A | Discharge status: Home / Self Care | Payer: Self-pay | Source: Ambulatory Visit | Attending: Orthopedic Surgery

## 2016-11-29 ENCOUNTER — Encounter (HOSPITAL_COMMUNITY): Payer: Self-pay | Admitting: *Deleted

## 2016-11-29 ENCOUNTER — Inpatient Hospital Stay (HOSPITAL_COMMUNITY)
Admission: RE | Admit: 2016-11-29 | Discharge: 2016-12-02 | DRG: 470 | Disposition: A | Discharge status: Home / Self Care | Payer: Medicare Other | Source: Ambulatory Visit | Attending: Orthopedic Surgery | Admitting: Orthopedic Surgery

## 2016-11-29 DIAGNOSIS — K219 Gastro-esophageal reflux disease without esophagitis: Secondary | ICD-10-CM | POA: Diagnosis present

## 2016-11-29 DIAGNOSIS — N182 Chronic kidney disease, stage 2 (mild): Secondary | ICD-10-CM | POA: Diagnosis not present

## 2016-11-29 DIAGNOSIS — E1022 Type 1 diabetes mellitus with diabetic chronic kidney disease: Secondary | ICD-10-CM | POA: Diagnosis present

## 2016-11-29 DIAGNOSIS — M1712 Unilateral primary osteoarthritis, left knee: Principal | ICD-10-CM | POA: Diagnosis present

## 2016-11-29 DIAGNOSIS — F419 Anxiety disorder, unspecified: Secondary | ICD-10-CM | POA: Diagnosis not present

## 2016-11-29 DIAGNOSIS — Z794 Long term (current) use of insulin: Secondary | ICD-10-CM

## 2016-11-29 DIAGNOSIS — I129 Hypertensive chronic kidney disease with stage 1 through stage 4 chronic kidney disease, or unspecified chronic kidney disease: Secondary | ICD-10-CM | POA: Diagnosis present

## 2016-11-29 DIAGNOSIS — E1042 Type 1 diabetes mellitus with diabetic polyneuropathy: Secondary | ICD-10-CM | POA: Diagnosis present

## 2016-11-29 DIAGNOSIS — E1036 Type 1 diabetes mellitus with diabetic cataract: Secondary | ICD-10-CM | POA: Diagnosis not present

## 2016-11-29 DIAGNOSIS — G2581 Restless legs syndrome: Secondary | ICD-10-CM | POA: Diagnosis present

## 2016-11-29 DIAGNOSIS — J45909 Unspecified asthma, uncomplicated: Secondary | ICD-10-CM | POA: Diagnosis present

## 2016-11-29 DIAGNOSIS — G8918 Other acute postprocedural pain: Secondary | ICD-10-CM | POA: Diagnosis not present

## 2016-11-29 DIAGNOSIS — Z79899 Other long term (current) drug therapy: Secondary | ICD-10-CM | POA: Diagnosis not present

## 2016-11-29 DIAGNOSIS — I700 Atherosclerosis of aorta: Secondary | ICD-10-CM | POA: Diagnosis present

## 2016-11-29 DIAGNOSIS — M179 Osteoarthritis of knee, unspecified: Secondary | ICD-10-CM | POA: Diagnosis present

## 2016-11-29 DIAGNOSIS — M25562 Pain in left knee: Secondary | ICD-10-CM | POA: Diagnosis not present

## 2016-11-29 DIAGNOSIS — H409 Unspecified glaucoma: Secondary | ICD-10-CM | POA: Diagnosis present

## 2016-11-29 DIAGNOSIS — I10 Essential (primary) hypertension: Secondary | ICD-10-CM | POA: Diagnosis not present

## 2016-11-29 DIAGNOSIS — F329 Major depressive disorder, single episode, unspecified: Secondary | ICD-10-CM | POA: Diagnosis not present

## 2016-11-29 DIAGNOSIS — E119 Type 2 diabetes mellitus without complications: Secondary | ICD-10-CM | POA: Diagnosis not present

## 2016-11-29 DIAGNOSIS — Z96653 Presence of artificial knee joint, bilateral: Secondary | ICD-10-CM | POA: Diagnosis present

## 2016-11-29 DIAGNOSIS — Z6835 Body mass index (BMI) 35.0-35.9, adult: Secondary | ICD-10-CM | POA: Diagnosis not present

## 2016-11-29 DIAGNOSIS — E669 Obesity, unspecified: Secondary | ICD-10-CM | POA: Diagnosis present

## 2016-11-29 DIAGNOSIS — Z853 Personal history of malignant neoplasm of breast: Secondary | ICD-10-CM | POA: Diagnosis not present

## 2016-11-29 DIAGNOSIS — M1710 Unilateral primary osteoarthritis, unspecified knee: Secondary | ICD-10-CM | POA: Diagnosis present

## 2016-11-29 HISTORY — PX: TOTAL KNEE ARTHROPLASTY: SHX125

## 2016-11-29 LAB — GLUCOSE, CAPILLARY
GLUCOSE-CAPILLARY: 104 mg/dL — AB (ref 65–99)
Glucose-Capillary: 146 mg/dL — ABNORMAL HIGH (ref 65–99)
Glucose-Capillary: 316 mg/dL — ABNORMAL HIGH (ref 65–99)

## 2016-11-29 LAB — TYPE AND SCREEN
ABO/RH(D): O POS
Antibody Screen: NEGATIVE

## 2016-11-29 LAB — SURGICAL PCR SCREEN
MRSA, PCR: NEGATIVE
STAPHYLOCOCCUS AUREUS: NEGATIVE

## 2016-11-29 SURGERY — ARTHROPLASTY, KNEE, TOTAL
Anesthesia: Spinal | Site: Knee | Laterality: Left

## 2016-11-29 MED ORDER — FLEET ENEMA 7-19 GM/118ML RE ENEM: 1.0000 | ENEMA | Freq: Once | RECTAL | Status: DC | PRN

## 2016-11-29 MED ORDER — SODIUM CHLORIDE 0.9 % IJ SOLN
INTRAMUSCULAR | Status: AC
  Filled 2016-11-29: qty 10

## 2016-11-29 MED ORDER — SODIUM CHLORIDE 0.9 % IJ SOLN
INTRAMUSCULAR | Status: AC
  Filled 2016-11-29: qty 50

## 2016-11-29 MED ORDER — PROMETHAZINE HCL 25 MG/ML IJ SOLN: 6.2500 mg | INTRAMUSCULAR | Status: DC | PRN

## 2016-11-29 MED ORDER — DEXAMETHASONE SODIUM PHOSPHATE 10 MG/ML IJ SOLN
10.0000 mg | Freq: Once | INTRAMUSCULAR | Status: DC
  Filled 2016-11-29: qty 1

## 2016-11-29 MED ORDER — ONDANSETRON HCL 4 MG/2ML IJ SOLN
INTRAMUSCULAR | Status: DC | PRN
  Administered 2016-11-29: 4 mg via INTRAVENOUS

## 2016-11-29 MED ORDER — SODIUM CHLORIDE 0.9 % IV SOLN
INTRAVENOUS | Status: DC
  Administered 2016-11-29: 75 mL/h via INTRAVENOUS
  Administered 2016-11-30: 02:00:00 via INTRAVENOUS

## 2016-11-29 MED ORDER — ATENOLOL 25 MG PO TABS
25.0000 mg | ORAL_TABLET | Freq: Every day | ORAL | Status: DC
  Administered 2016-11-30 – 2016-12-02 (×3): 25 mg via ORAL
  Filled 2016-11-29 (×3): qty 1

## 2016-11-29 MED ORDER — PROPOFOL 10 MG/ML IV BOLUS
INTRAVENOUS | Status: AC
  Filled 2016-11-29: qty 60

## 2016-11-29 MED ORDER — CEFAZOLIN SODIUM-DEXTROSE 1-4 GM/50ML-% IV SOLN
1.0000 g | Freq: Four times a day (QID) | INTRAVENOUS | Status: AC
  Administered 2016-11-29 (×2): 1 g via INTRAVENOUS
  Filled 2016-11-29 (×2): qty 50

## 2016-11-29 MED ORDER — ISOMETHEPTENE-CAFFEINE-APAP 65-20-325 MG PO TABS: 1.0000 | ORAL_TABLET | Freq: Every day | ORAL | Status: DC | PRN

## 2016-11-29 MED ORDER — MIDAZOLAM HCL 2 MG/2ML IJ SOLN
INTRAMUSCULAR | Status: AC
  Administered 2016-11-29: 1 mg via INTRAVENOUS
  Filled 2016-11-29: qty 2

## 2016-11-29 MED ORDER — DEXAMETHASONE SODIUM PHOSPHATE 10 MG/ML IJ SOLN
10.0000 mg | Freq: Once | INTRAMUSCULAR | Status: AC
  Administered 2016-11-29: 10 mg via INTRAVENOUS

## 2016-11-29 MED ORDER — MIDAZOLAM HCL 2 MG/2ML IJ SOLN
1.0000 mg | INTRAMUSCULAR | Status: DC | PRN
  Administered 2016-11-29: 1 mg via INTRAVENOUS
  Filled 2016-11-29: qty 2

## 2016-11-29 MED ORDER — DIPHENHYDRAMINE HCL 12.5 MG/5ML PO ELIX: 12.5000 mg | ORAL_SOLUTION | ORAL | Status: DC | PRN

## 2016-11-29 MED ORDER — SODIUM CHLORIDE 0.9 % IR SOLN
Status: DC | PRN
  Administered 2016-11-29: 1000 mL

## 2016-11-29 MED ORDER — FENTANYL CITRATE (PF) 100 MCG/2ML IJ SOLN
INTRAMUSCULAR | Status: AC
  Filled 2016-11-29: qty 2

## 2016-11-29 MED ORDER — ACETAMINOPHEN 650 MG RE SUPP: 650.0000 mg | Freq: Four times a day (QID) | RECTAL | Status: DC | PRN

## 2016-11-29 MED ORDER — METOCLOPRAMIDE HCL 5 MG/ML IJ SOLN: 5.0000 mg | Freq: Three times a day (TID) | INTRAMUSCULAR | Status: DC | PRN

## 2016-11-29 MED ORDER — SODIUM CHLORIDE 0.9 % IJ SOLN
INTRAMUSCULAR | Status: DC | PRN
  Administered 2016-11-29: 50 mL
  Administered 2016-11-29: 10 mL

## 2016-11-29 MED ORDER — INSULIN ASPART 100 UNIT/ML ~~LOC~~ SOLN
0.0000 [IU] | Freq: Three times a day (TID) | SUBCUTANEOUS | Status: DC
  Administered 2016-11-29 – 2016-11-30 (×3): 3 [IU] via SUBCUTANEOUS
  Administered 2016-11-30: 2 [IU] via SUBCUTANEOUS
  Administered 2016-12-02: 3 [IU] via SUBCUTANEOUS

## 2016-11-29 MED ORDER — LATANOPROST 0.005 % OP SOLN
1.0000 [drp] | Freq: Every day | OPHTHALMIC | Status: DC
  Administered 2016-11-29 – 2016-12-01 (×3): 1 [drp] via OPHTHALMIC
  Filled 2016-11-29: qty 2.5

## 2016-11-29 MED ORDER — METHOCARBAMOL 500 MG PO TABS
500.0000 mg | ORAL_TABLET | Freq: Four times a day (QID) | ORAL | Status: DC | PRN
  Administered 2016-11-29 – 2016-12-01 (×7): 500 mg via ORAL
  Filled 2016-11-29 (×7): qty 1

## 2016-11-29 MED ORDER — DORZOLAMIDE HCL-TIMOLOL MAL 2-0.5 % OP SOLN
1.0000 [drp] | Freq: Two times a day (BID) | OPHTHALMIC | Status: DC
  Administered 2016-11-29 – 2016-12-02 (×7): 1 [drp] via OPHTHALMIC
  Filled 2016-11-29: qty 10

## 2016-11-29 MED ORDER — TRANEXAMIC ACID 1000 MG/10ML IV SOLN
2000.0000 mg | Freq: Once | INTRAVENOUS | Status: DC
  Filled 2016-11-29: qty 20

## 2016-11-29 MED ORDER — PROPOFOL 500 MG/50ML IV EMUL
INTRAVENOUS | Status: DC | PRN
  Administered 2016-11-29: 100 ug/kg/min via INTRAVENOUS

## 2016-11-29 MED ORDER — RIVAROXABAN 10 MG PO TABS
10.0000 mg | ORAL_TABLET | Freq: Every day | ORAL | Status: DC
  Administered 2016-11-30 – 2016-12-02 (×3): 10 mg via ORAL
  Filled 2016-11-29 (×3): qty 1

## 2016-11-29 MED ORDER — PRAMIPEXOLE DIHYDROCHLORIDE 0.25 MG PO TABS
0.5000 mg | ORAL_TABLET | Freq: Every day | ORAL | Status: DC
  Administered 2016-11-29 – 2016-12-01 (×3): 0.5 mg via ORAL
  Filled 2016-11-29 (×3): qty 2

## 2016-11-29 MED ORDER — DEXAMETHASONE SODIUM PHOSPHATE 10 MG/ML IJ SOLN
INTRAMUSCULAR | Status: AC
  Filled 2016-11-29: qty 1

## 2016-11-29 MED ORDER — MIDAZOLAM HCL 2 MG/2ML IJ SOLN
INTRAMUSCULAR | Status: AC
  Filled 2016-11-29: qty 2

## 2016-11-29 MED ORDER — BUPIVACAINE LIPOSOME 1.3 % IJ SUSP
INTRAMUSCULAR | Status: DC | PRN
  Administered 2016-11-29: 20 mL

## 2016-11-29 MED ORDER — ONDANSETRON HCL 4 MG/2ML IJ SOLN: 4.0000 mg | Freq: Four times a day (QID) | INTRAMUSCULAR | Status: DC | PRN

## 2016-11-29 MED ORDER — MENTHOL 3 MG MT LOZG
1.0000 | LOZENGE | OROMUCOSAL | Status: DC | PRN
  Filled 2016-11-29: qty 9

## 2016-11-29 MED ORDER — METOCLOPRAMIDE HCL 5 MG PO TABS: 5.0000 mg | ORAL_TABLET | Freq: Three times a day (TID) | ORAL | Status: DC | PRN

## 2016-11-29 MED ORDER — ROPIVACAINE HCL 5 MG/ML IJ SOLN
INTRAMUSCULAR | Status: DC | PRN
  Administered 2016-11-29: 30 mL via PERINEURAL

## 2016-11-29 MED ORDER — FLUOXETINE HCL 20 MG PO CAPS
40.0000 mg | ORAL_CAPSULE | Freq: Every day | ORAL | Status: DC
  Administered 2016-11-30 – 2016-12-02 (×3): 40 mg via ORAL
  Filled 2016-11-29 (×3): qty 2

## 2016-11-29 MED ORDER — HYDROMORPHONE HCL-NACL 0.5-0.9 MG/ML-% IV SOSY
0.5000 mg | PREFILLED_SYRINGE | INTRAVENOUS | Status: DC | PRN
  Administered 2016-11-30: 0.5 mg via INTRAVENOUS

## 2016-11-29 MED ORDER — DEXTROSE 5 % IV SOLN
INTRAVENOUS | Status: DC | PRN
  Administered 2016-11-29: 50 ug/min via INTRAVENOUS

## 2016-11-29 MED ORDER — BISACODYL 10 MG RE SUPP: 10.0000 mg | Freq: Every day | RECTAL | Status: DC | PRN

## 2016-11-29 MED ORDER — LIDOCAINE 2% (20 MG/ML) 5 ML SYRINGE
INTRAMUSCULAR | Status: AC
  Filled 2016-11-29: qty 5

## 2016-11-29 MED ORDER — TRANEXAMIC ACID 1000 MG/10ML IV SOLN
INTRAVENOUS | Status: AC | PRN
  Administered 2016-11-29: 2000 mg via TOPICAL

## 2016-11-29 MED ORDER — ONDANSETRON HCL 4 MG PO TABS: 4.0000 mg | ORAL_TABLET | Freq: Four times a day (QID) | ORAL | Status: DC | PRN

## 2016-11-29 MED ORDER — FENTANYL CITRATE (PF) 100 MCG/2ML IJ SOLN
50.0000 ug | INTRAMUSCULAR | Status: DC | PRN
  Administered 2016-11-29: 50 ug via INTRAVENOUS

## 2016-11-29 MED ORDER — GLIMEPIRIDE 4 MG PO TABS
4.0000 mg | ORAL_TABLET | Freq: Every day | ORAL | Status: DC
  Administered 2016-11-30 – 2016-12-02 (×3): 4 mg via ORAL
  Filled 2016-11-29 (×3): qty 1

## 2016-11-29 MED ORDER — PHENOL 1.4 % MT LIQD
1.0000 | OROMUCOSAL | Status: DC | PRN
  Filled 2016-11-29: qty 177

## 2016-11-29 MED ORDER — ONDANSETRON HCL 4 MG/2ML IJ SOLN
INTRAMUSCULAR | Status: AC
  Filled 2016-11-29: qty 2

## 2016-11-29 MED ORDER — LORATADINE 10 MG PO TABS
10.0000 mg | ORAL_TABLET | Freq: Every day | ORAL | Status: DC
  Administered 2016-11-30 – 2016-12-02 (×3): 10 mg via ORAL
  Filled 2016-11-29 (×3): qty 1

## 2016-11-29 MED ORDER — LIRAGLUTIDE 18 MG/3ML ~~LOC~~ SOPN
1.8000 mg | PEN_INJECTOR | Freq: Every day | SUBCUTANEOUS | Status: DC
  Administered 2016-11-30 – 2016-12-02 (×3): 1.8 mg via SUBCUTANEOUS

## 2016-11-29 MED ORDER — MIDAZOLAM HCL 5 MG/5ML IJ SOLN
INTRAMUSCULAR | Status: DC | PRN
  Administered 2016-11-29: 1 mg via INTRAVENOUS

## 2016-11-29 MED ORDER — BUPIVACAINE LIPOSOME 1.3 % IJ SUSP
20.0000 mL | Freq: Once | INTRAMUSCULAR | Status: DC
  Filled 2016-11-29: qty 20

## 2016-11-29 MED ORDER — HYDROMORPHONE HCL 2 MG PO TABS
2.0000 mg | ORAL_TABLET | ORAL | Status: DC | PRN
  Administered 2016-11-29 (×4): 2 mg via ORAL
  Administered 2016-11-30 (×4): 4 mg via ORAL
  Administered 2016-11-30: 2 mg via ORAL
  Administered 2016-12-01 – 2016-12-02 (×7): 4 mg via ORAL
  Administered 2016-12-02 (×2): 2 mg via ORAL
  Filled 2016-11-29 (×6): qty 2
  Filled 2016-11-29: qty 1
  Filled 2016-11-29 (×5): qty 2
  Filled 2016-11-29: qty 1
  Filled 2016-11-29 (×4): qty 2

## 2016-11-29 MED ORDER — TEMAZEPAM 15 MG PO CAPS
30.0000 mg | ORAL_CAPSULE | Freq: Every day | ORAL | Status: DC
  Administered 2016-11-29 – 2016-12-01 (×3): 30 mg via ORAL
  Filled 2016-11-29 (×3): qty 2

## 2016-11-29 MED ORDER — LACTATED RINGERS IV SOLN
INTRAVENOUS | Status: DC
  Administered 2016-11-29 (×2): via INTRAVENOUS

## 2016-11-29 MED ORDER — HYDROMORPHONE HCL-NACL 0.5-0.9 MG/ML-% IV SOSY
0.2500 mg | PREFILLED_SYRINGE | INTRAVENOUS | Status: DC | PRN
  Filled 2016-11-29: qty 1

## 2016-11-29 MED ORDER — FENTANYL CITRATE (PF) 100 MCG/2ML IJ SOLN
INTRAMUSCULAR | Status: DC | PRN
  Administered 2016-11-29: 50 ug via INTRAVENOUS

## 2016-11-29 MED ORDER — METHOCARBAMOL 1000 MG/10ML IJ SOLN
500.0000 mg | Freq: Four times a day (QID) | INTRAVENOUS | Status: DC | PRN
  Administered 2016-11-29: 500 mg via INTRAVENOUS
  Filled 2016-11-29: qty 550

## 2016-11-29 MED ORDER — DOCUSATE SODIUM 100 MG PO CAPS
100.0000 mg | ORAL_CAPSULE | Freq: Two times a day (BID) | ORAL | Status: DC
  Administered 2016-11-29 – 2016-12-02 (×6): 100 mg via ORAL
  Filled 2016-11-29 (×6): qty 1

## 2016-11-29 MED ORDER — POLYETHYLENE GLYCOL 3350 17 G PO PACK: 17.0000 g | PACK | Freq: Every day | ORAL | Status: DC | PRN

## 2016-11-29 MED ORDER — ACETAMINOPHEN 325 MG PO TABS
650.0000 mg | ORAL_TABLET | Freq: Four times a day (QID) | ORAL | Status: DC | PRN
  Administered 2016-12-01: 650 mg via ORAL
  Filled 2016-11-29: qty 2

## 2016-11-29 MED ORDER — ACETAMINOPHEN 10 MG/ML IV SOLN
INTRAVENOUS | Status: AC
  Filled 2016-11-29: qty 100

## 2016-11-29 MED ORDER — BUPIVACAINE IN DEXTROSE 0.75-8.25 % IT SOLN
INTRATHECAL | Status: DC | PRN
  Administered 2016-11-29: 1.8 mL via INTRATHECAL

## 2016-11-29 MED ORDER — FENTANYL CITRATE (PF) 100 MCG/2ML IJ SOLN
INTRAMUSCULAR | Status: AC
  Administered 2016-11-29: 50 ug via INTRAVENOUS
  Filled 2016-11-29: qty 2

## 2016-11-29 MED ORDER — ACETAMINOPHEN 500 MG PO TABS
1000.0000 mg | ORAL_TABLET | Freq: Four times a day (QID) | ORAL | Status: AC
  Administered 2016-11-29 – 2016-11-30 (×4): 1000 mg via ORAL
  Filled 2016-11-29 (×4): qty 2

## 2016-11-29 MED ORDER — CHLORHEXIDINE GLUCONATE 4 % EX LIQD: 60.0000 mL | Freq: Once | CUTANEOUS | Status: DC

## 2016-11-29 MED ORDER — PHENYLEPHRINE HCL 10 MG/ML IJ SOLN
INTRAMUSCULAR | Status: AC
  Filled 2016-11-29: qty 1

## 2016-11-29 MED ORDER — ACETAMINOPHEN 10 MG/ML IV SOLN
1000.0000 mg | Freq: Once | INTRAVENOUS | Status: AC
  Administered 2016-11-29: 1000 mg via INTRAVENOUS

## 2016-11-29 SURGICAL SUPPLY — 48 items
BAG DECANTER FOR FLEXI CONT (MISCELLANEOUS) ×2 IMPLANT
BAG ZIPLOCK 12X15 (MISCELLANEOUS) ×2 IMPLANT
BANDAGE ACE 6X5 VEL STRL LF (GAUZE/BANDAGES/DRESSINGS) ×2 IMPLANT
BLADE SAG 18X100X1.27 (BLADE) ×2 IMPLANT
BLADE SAW SGTL 11.0X1.19X90.0M (BLADE) ×2 IMPLANT
BOWL SMART MIX CTS (DISPOSABLE) ×2 IMPLANT
CAP KNEE TOTAL 3 SIGMA ×2 IMPLANT
CEMENT HV SMART SET (Cement) ×2 IMPLANT
COVER SURGICAL LIGHT HANDLE (MISCELLANEOUS) ×2 IMPLANT
CUFF TOURN SGL QUICK 34 (TOURNIQUET CUFF) ×1
CUFF TRNQT CYL 34X4X40X1 (TOURNIQUET CUFF) ×1 IMPLANT
DECANTER SPIKE VIAL GLASS SM (MISCELLANEOUS) ×2 IMPLANT
DRAPE U-SHAPE 47X51 STRL (DRAPES) ×2 IMPLANT
DRSG ADAPTIC 3X8 NADH LF (GAUZE/BANDAGES/DRESSINGS) ×2 IMPLANT
DRSG PAD ABDOMINAL 8X10 ST (GAUZE/BANDAGES/DRESSINGS) ×2 IMPLANT
DURAPREP 26ML APPLICATOR (WOUND CARE) ×2 IMPLANT
ELECT REM PT RETURN 15FT ADLT (MISCELLANEOUS) ×2 IMPLANT
EVACUATOR 1/8 PVC DRAIN (DRAIN) ×2 IMPLANT
GAUZE SPONGE 4X4 12PLY STRL (GAUZE/BANDAGES/DRESSINGS) ×2 IMPLANT
GLOVE BIO SURGEON STRL SZ7.5 (GLOVE) IMPLANT
GLOVE BIO SURGEON STRL SZ8 (GLOVE) ×2 IMPLANT
GLOVE BIOGEL PI IND STRL 6.5 (GLOVE) IMPLANT
GLOVE BIOGEL PI IND STRL 8 (GLOVE) ×1 IMPLANT
GLOVE BIOGEL PI INDICATOR 6.5 (GLOVE)
GLOVE BIOGEL PI INDICATOR 8 (GLOVE) ×1
GLOVE SURG SS PI 6.5 STRL IVOR (GLOVE) ×12 IMPLANT
GOWN STRL REUS W/TWL LRG LVL3 (GOWN DISPOSABLE) ×4 IMPLANT
GOWN STRL REUS W/TWL XL LVL3 (GOWN DISPOSABLE) ×4 IMPLANT
HANDPIECE INTERPULSE COAX TIP (DISPOSABLE) ×1
IMMOBILIZER KNEE 20 (SOFTGOODS) ×2
IMMOBILIZER KNEE 20 THIGH 36 (SOFTGOODS) ×1 IMPLANT
MANIFOLD NEPTUNE II (INSTRUMENTS) ×2 IMPLANT
NS IRRIG 1000ML POUR BTL (IV SOLUTION) ×2 IMPLANT
PACK TOTAL KNEE CUSTOM (KITS) ×2 IMPLANT
PADDING CAST COTTON 6X4 STRL (CAST SUPPLIES) ×2 IMPLANT
POSITIONER SURGICAL ARM (MISCELLANEOUS) ×2 IMPLANT
SET HNDPC FAN SPRY TIP SCT (DISPOSABLE) ×1 IMPLANT
STRIP CLOSURE SKIN 1/2X4 (GAUZE/BANDAGES/DRESSINGS) ×2 IMPLANT
SUT MNCRL AB 4-0 PS2 18 (SUTURE) ×2 IMPLANT
SUT STRATAFIX 0 PDS 27 VIOLET (SUTURE) ×2
SUT VIC AB 2-0 CT1 27 (SUTURE) ×3
SUT VIC AB 2-0 CT1 TAPERPNT 27 (SUTURE) ×3 IMPLANT
SUTURE STRATFX 0 PDS 27 VIOLET (SUTURE) ×1 IMPLANT
SYR 30ML LL (SYRINGE) ×4 IMPLANT
TRAY FOLEY W/METER SILVER 16FR (SET/KITS/TRAYS/PACK) ×2 IMPLANT
WATER STERILE IRR 1000ML POUR (IV SOLUTION) ×4 IMPLANT
WRAP KNEE MAXI GEL POST OP (GAUZE/BANDAGES/DRESSINGS) ×2 IMPLANT
YANKAUER SUCT BULB TIP 10FT TU (MISCELLANEOUS) ×2 IMPLANT

## 2016-11-29 NOTE — Transfer of Care (Signed)
Immediate Anesthesia Transfer of Care Note  Patient: [name]  Procedure(s) Performed: Procedure(s): LEFT TOTAL KNEE ARTHROPLASTY (Left)  Patient Location: PACU  Anesthesia Type:Spinal  Level of Consciousness: awake, alert , oriented and patient cooperative  Airway & Oxygen Therapy: Patient Spontanous Breathing and Patient connected to face mask oxygen  Post-op Assessment: Report given to RN and Post -op Vital signs reviewed and stable  Post vital signs: stable  Last Vitals:  Vitals:   11/29/16 1041 11/29/16 1042  BP:  (!) 101/46  Pulse: 77 77  Resp: 12 13  Temp:      Last Pain:  Vitals:   11/29/16 0947  TempSrc: Oral      Patients Stated Pain Goal: 3 (86/77/37 3668)  Complications: No apparent anesthesia complications

## 2016-11-29 NOTE — Discharge Instructions (Addendum)
°  ° °Dr. Frank Aluisio °Total Joint Specialist °[facility] °[address] °[place], [place] [address] °[phone] ° °TOTAL KNEE REPLACEMENT POSTOPERATIVE DIRECTIONS ° °Knee Rehabilitation, Guidelines Following Surgery  °Results after knee surgery are often greatly improved when you follow the exercise, range of motion and muscle strengthening exercises prescribed by your doctor. Safety measures are also important to protect the knee from further injury. Any time any of these exercises cause you to have increased pain or swelling in your knee joint, decrease the amount until you are comfortable again and slowly increase them. If you have problems or questions, call your caregiver or physical therapist for advice.  ° °HOME CARE INSTRUCTIONS  °Remove items at home which could result in a fall. This includes throw rugs or furniture in walking pathways.  °· ICE to the affected knee every three hours for 30 minutes at a time and then as needed for pain and swelling.  Continue to use ice on the knee for pain and swelling from surgery. You may notice swelling that will progress down to the foot and ankle.  This is normal after surgery.  Elevate the leg when you are not up walking on it.   °· Continue to use the breathing machine which will help keep your temperature down.  It is common for your temperature to cycle up and down following surgery, especially at night when you are not up moving around and exerting yourself.  The breathing machine keeps your lungs expanded and your temperature down. °· Do not place pillow under knee, focus on keeping the knee straight while resting ° °DIET °You may resume your previous home diet once your are discharged from the hospital. ° °DRESSING / WOUND CARE / SHOWERING °You may shower 3 days after surgery, but keep the wounds dry during showering.  You may use an occlusive plastic wrap (Press'n Seal for example), NO SOAKING/SUBMERGING IN THE BATHTUB.  If the  bandage gets wet, change with a clean dry gauze.  If the incision gets wet, pat the wound dry with a clean towel. °You may start showering once you are discharged home but do not submerge the incision under water. Just pat the incision dry and apply a dry gauze dressing on daily. °Change the surgical dressing daily and reapply a dry dressing each time. ° °ACTIVITY °Walk with your walker as instructed. °Use walker as long as suggested by your caregivers. °Avoid periods of inactivity such as sitting longer than an hour when not asleep. This helps prevent blood clots.  °You may resume a sexual relationship in one month or when given the OK by your doctor.  °You may return to work once you are cleared by your doctor.  °Do not drive a car for 6 weeks or until released by you surgeon.  °Do not drive while taking narcotics. ° °WEIGHT BEARING °Weight bearing as tolerated with assist device (walker, cane, etc) as directed, use it as long as suggested by your surgeon or therapist, typically at least 4-6 weeks. ° °POSTOPERATIVE CONSTIPATION PROTOCOL °Constipation - defined medically as fewer than three stools per week and severe constipation as less than one stool per week. ° °One of the most common issues patients have following surgery is constipation.  Even if you have a regular bowel pattern at home, your normal regimen is likely to be disrupted due to multiple reasons following surgery.  Combination of anesthesia, postoperative narcotics, change in appetite and fluid intake all can affect your   bowels.  In order to avoid complications following surgery, here are some recommendations in order to help you during your recovery period. ° °Colace (docusate) - Pick up an over-the-counter form of Colace or another stool softener and take twice a day as long as you are requiring postoperative pain medications.  Take with a full glass of water daily.  If you experience loose stools or diarrhea, hold the colace until you stool forms  back up.  If your symptoms do not get better within 1 week or if they get worse, check with your doctor. ° °Dulcolax (bisacodyl) - Pick up over-the-counter and take as directed by the product packaging as needed to assist with the movement of your bowels.  Take with a full glass of water.  Use this product as needed if not relieved by Colace only.  ° °MiraLax (polyethylene glycol) - Pick up over-the-counter to have on hand.  MiraLax is a solution that will increase the amount of water in your bowels to assist with bowel movements.  Take as directed and can mix with a glass of water, juice, soda, coffee, or tea.  Take if you go more than two days without a movement. °Do not use MiraLax more than once per day. Call your doctor if you are still constipated or irregular after using this medication for 7 days in a row. ° °If you continue to have problems with postoperative constipation, please contact the office for further assistance and recommendations.  If you experience "the worst abdominal pain ever" or develop nausea or vomiting, please contact the office immediatly for further recommendations for treatment. ° °ITCHING ° If you experience itching with your medications, try taking only a single pain pill, or even half a pain pill at a time.  You can also use Benadryl over the counter for itching or also to help with sleep.  ° °TED HOSE STOCKINGS °Wear the elastic stockings on both legs for three weeks following surgery during the day but you may remove then at night for sleeping. ° °MEDICATIONS °See your medication summary on the “After Visit Summary” that the nursing staff will review with you prior to discharge.  You may have some home medications which will be placed on hold until you complete the course of blood thinner medication.  It is important for you to complete the blood thinner medication as prescribed by your surgeon.  Continue your approved medications as instructed at time of  discharge. ° °PRECAUTIONS °If you experience chest pain or shortness of breath - call 911 immediately for transfer to the hospital emergency department.  °If you develop a fever greater that 101 F, purulent drainage from wound, increased redness or drainage from wound, foul odor from the wound/dressing, or calf pain - CONTACT YOUR SURGEON.   °                                                °FOLLOW-UP APPOINTMENTS °Make sure you keep all of your appointments after your operation with your surgeon and caregivers. You should call the office at the above phone number and make an appointment for approximately two weeks after the date of your surgery or on the date instructed by your surgeon outlined in the "After Visit Summary". ° ° °RANGE OF MOTION AND STRENGTHENING EXERCISES  °Rehabilitation of the knee is important following a knee   injury or an operation. After just a few days of immobilization, the muscles of the thigh which control the knee become weakened and shrink (atrophy). Knee exercises are designed to build up the tone and strength of the thigh muscles and to improve knee motion. Often times heat used for twenty to thirty minutes before working out will loosen up your tissues and help with improving the range of motion but do not use heat for the first two weeks following surgery. These exercises can be done on a training (exercise) mat, on the floor, on a table or on a bed. Use what ever works the best and is most comfortable for you Knee exercises include:  °Leg Lifts - While your knee is still immobilized in a splint or cast, you can do straight leg raises. Lift the leg to 60 degrees, hold for 3 sec, and slowly lower the leg. Repeat 10-20 times 2-3 times daily. Perform this exercise against resistance later as your knee gets better.  °Quad and Hamstring Sets - Tighten up the muscle on the front of the thigh (Quad) and hold for 5-10 sec. Repeat this 10-20 times hourly. Hamstring sets are done by pushing the  foot backward against an object and holding for 5-10 sec. Repeat as with quad sets.  °· Leg Slides: Lying on your back, slowly slide your foot toward your buttocks, bending your knee up off the floor (only go as far as is comfortable). Then slowly slide your foot back down until your leg is flat on the floor again. °· Angel Wings: Lying on your back spread your legs to the side as far apart as you can without causing discomfort.  °A rehabilitation program following serious knee injuries can speed recovery and prevent re-injury in the future due to weakened muscles. Contact your doctor or a physical therapist for more information on knee rehabilitation.  ° °IF YOU ARE TRANSFERRED TO A SKILLED REHAB FACILITY °If the patient is transferred to a skilled rehab facility following release from the hospital, a list of the current medications will be sent to the facility for the patient to continue.  When discharged from the skilled rehab facility, please have the facility set up the patient's Home Health Physical Therapy prior to being released. Also, the skilled facility will be responsible for providing the patient with their medications at time of release from the facility to include their pain medication, the muscle relaxants, and their blood thinner medication. If the patient is still at the rehab facility at time of the two week follow up appointment, the skilled rehab facility will also need to assist the patient in arranging follow up appointment in our office and any transportation needs. ° °MAKE SURE YOU:  °Understand these instructions.  °Get help right away if you are not doing well or get worse.  ° ° °Pick up stool softner and laxative for home use following surgery while on pain medications. °Do not submerge incision under water. °Please use good hand washing techniques while changing dressing each day. °May shower starting three days after surgery. °Please use a clean towel to pat the incision dry following  showers. °Continue to use ice for pain and swelling after surgery. °Do not use any lotions or creams on the incision until instructed by your surgeon. ° °Take Xarelto for two and a half more weeks following discharge from the hospital, then discontinue Xarelto. °Once the patient has completed the blood thinner regimen, then take a Baby 81 mg Aspirin daily   for three more weeks. ° ° °Information on my medicine - XARELTO® (Rivaroxaban) ° ° °Why was Xarelto® prescribed for you? °Xarelto® was prescribed for you to reduce the risk of blood clots forming after orthopedic surgery. The medical term for these abnormal blood clots is venous thromboembolism (VTE). ° °What do you need to know about xarelto® ? °Take your Xarelto® ONCE DAILY at the same time every day. °You may take it either with or without food. ° °If you have difficulty swallowing the tablet whole, you may crush it and mix in applesauce just prior to taking your dose. ° °Take Xarelto® exactly as prescribed by your doctor and DO NOT stop taking Xarelto® without talking to the doctor who prescribed the medication.  Stopping without other VTE prevention medication to take the place of Xarelto® may increase your risk of developing a clot. ° °After discharge, you should have regular check-up appointments with your healthcare provider that is prescribing your Xarelto®.   ° °What do you do if you miss a dose? °If you miss a dose, take it as soon as you remember on the same day then continue your regularly scheduled once daily regimen the next day. Do not take two doses of Xarelto® on the same day.  ° °Important Safety Information °A possible side effect of Xarelto® is bleeding. You should call your healthcare provider right away if you experience any of the following: °? Bleeding from an injury or your nose that does not stop. °? Unusual colored urine (red or dark [place]) or unusual colored stools (red or black). °? Unusual bruising for unknown reasons. °? A serious  fall or if you hit your head (even if there is no bleeding). ° °Some medicines may interact with Xarelto® and might increase your risk of bleeding while on Xarelto®. To help avoid this, consult your healthcare provider or pharmacist prior to using any new prescription or non-prescription medications, including herbals, vitamins, non-steroidal anti-inflammatory drugs (NSAIDs) and supplements. ° °This website has more information on Xarelto®: www.xarelto.com. ° ° °

## 2016-11-29 NOTE — Progress Notes (Signed)
Assisted Dr. Rose with left, ultrasound guided, adductor canal block. Side rails up, monitors on throughout procedure. See vital signs in flow sheet. Tolerated Procedure well.  

## 2016-11-29 NOTE — Anesthesia Procedure Notes (Signed)
Procedure Name: MAC Date/Time: 11/29/2016 11:05 AM Performed by: Lissa Morales Pre-anesthesia Checklist: Patient identified, Emergency Drugs available, Suction available and Patient being monitored Oxygen Delivery Method: Simple face mask Airway Equipment and Method: Oral airway Placement Confirmation: positive ETCO2 Dental Injury: Teeth and Oropharynx as per pre-operative assessment

## 2016-11-29 NOTE — Anesthesia Preprocedure Evaluation (Signed)
Anesthesia Evaluation  Patient identified by MRN, date of birth, ID band Patient awake    Reviewed: Allergy & Precautions, NPO status , Patient's Chart, lab work & pertinent test results  History of Anesthesia Complications (+) PONV  Airway Mallampati: II  TM Distance: >3 FB Neck ROM: Full    Dental no notable dental hx.    Pulmonary neg pulmonary ROS,    Pulmonary exam normal breath sounds clear to auscultation       Cardiovascular hypertension, Pt. on medications and Pt. on home beta blockers Normal cardiovascular exam Rhythm:Regular Rate:Normal     Neuro/Psych negative neurological ROS  negative psych ROS   GI/Hepatic negative GI ROS, Neg liver ROS,   Endo/Other  diabetes  Renal/GU negative Renal ROS  negative genitourinary   Musculoskeletal negative musculoskeletal ROS (+)   Abdominal   Peds negative pediatric ROS (+)  Hematology negative hematology ROS (+)   Anesthesia Other Findings   Reproductive/Obstetrics negative OB ROS                             Anesthesia Physical Anesthesia Plan  ASA: II  Anesthesia Plan: Spinal   Post-op Pain Management:    Induction: Intravenous  PONV Risk Score and Plan: 2 and Ondansetron, Dexamethasone and Propofol  Airway Management Planned: Simple Face Mask  Additional Equipment:   Intra-op Plan:   Post-operative Plan:   Informed Consent: I have reviewed the patients History and Physical, chart, labs and discussed the procedure including the risks, benefits and alternatives for the proposed anesthesia with the patient or authorized representative who has indicated his/her understanding and acceptance.   Dental advisory given  Plan Discussed with: CRNA and Surgeon  Anesthesia Plan Comments:         Anesthesia Quick Evaluation

## 2016-11-29 NOTE — Anesthesia Procedure Notes (Signed)
Anesthesia Procedure Image    

## 2016-11-29 NOTE — H&P (View-Only) (Signed)
[name] DOB: Jan 04, 1941 Married / Language: English / Race: White Female Date of Admission:  11/29/2016 CC:  Left knee pain History of Present Illness The patient is a 76 year old female who comes in  for a preoperative History and Physical. The patient is scheduled for a left total knee arthroplasty to be performed by Dr. Dione Plover. Aluisio, MD at [facility] on 11/29/2016. The patient is a 76 year old female who presented for follow up of their knee. The patient is being followed for their left knee pain and osteoarthritis. They are now month(s) out from s/p aspiration of baker cyst (Dr Delilah Shan). Symptoms reported include: pain, stiffness, giving way, instability, pain with weightbearing and difficulty ambulating. She reported her pain level to be mild. The following medication has been used for pain control: none. Ms. Scorza unfortunately has had progressively worsening problems with her left knee. It is getting much harder for her to get around now. The knee hurts with all activities. It is limiting what she can and cannot do. She also has significant posterior tibial tendon insufficiency of that left foot and ankle. Radiographs were reviewed, AP and lateral of left knee. She has got bone-on-bone arthritis, lateral and patellofemoral with some medial narrowing also. She has got advanced arthritic change. Injection is no longer beneficial. At this point, the most predictable means of improving pain and function is total knee arthroplasty. The procedure, risks, potential complications and rehab course are discussed in detail and the patient elects to proceed. She is very familiar from having the other side done. They have been treated conservatively in the past for the above stated problem and despite conservative measures, they continue to have progressive pain and severe functional limitations and dysfunction. They have failed non-operative management including home exercise,  medications, and injections. It is felt that they would benefit from undergoing total joint replacement. Risks and benefits of the procedure have been discussed with the patient and they elect to proceed with surgery. There are no active contraindications to surgery such as ongoing infection or rapidly progressive neurological disease.   Problem List/Past Medical  Hammer toe (M20.40)  Fracture, humerus, head (S42.293A)  Foot infection (L08.9)  Flat foot (M21.40)  Status post total right knee replacement (H96.222)  Synovial cyst of popliteal space, left (M71.22)  Tibialis tendonitis (L79.892)  Breast Cancer  Hypertension  Diabetes Mellitus, Type I  Peripheral Neuropathy  Migraine Headache  Hyperthyroidism  High blood pressure  Osteoarthritis  Diabetes Mellitus, Type II  Primary localized osteoarthritis of right knee (M17.11)  Migraine Headache  Anxiety Disorder  Depression  Glaucoma  Cataract  Bronchiectasis  Hemorrhoids  Irritable bowel syndrome  Diverticulosis  Urinary Incontinence  Kidney Stone  Cystitis  Obesity  Carotid Artery Stenosis  Measles  Mumps  Eczema  B12 Deficiency Anemia  Insomnia  Restless Leg Syndrome  Neuropathic Idiopathic Peripheral Neuropathy  Gastroesophageal Reflux Disease  Fatty Liver  Chronic kidney disease  Stage II Diverticulitis Of Colon  Solitary Pulmonary Nodule  Aortic Atherosclerosis  Vitamin D Deficiency   Allergies Macrodantin *URINARY ANTI-INFECTIVES*  Headache. Erythromycin *MACROLIDES*  Diarrhea, Hives. Keflex *CEPHALOSPORINS*  Diarrhea. Levaquin *FLUOROQUINOLONES*  Tendon Issues OxyCODONE HCl *ANALGESICS - OPIOID*  Itching.  Family History  Rheumatoid Arthritis  Mother. mother Osteoporosis  mother Heart Disease  father Chronic Obstructive Lung Disease  father Osteoarthritis  father Father  Deceased. age 7 Mother  Deceased. age 59  Social History  Pain  Contract  no Tobacco use  Never smoker. never smoker Number of flights of stairs before winded  greater than 5 Marital status  married No alcohol use  Drug/Alcohol Rehab (Previously)  no Drug/Alcohol Rehab (Currently)  no Current work status  retired Living situation  live with spouse Illicit drug use  no Exercise  Exercises rarely Children  1 Alcohol use  never consumed alcohol Post-Surgical Plans  Home With Caregiver, Home with [place]. Advance Directives  Living Will, Healthcare Power of [place].  Medication History  FreeStyle Lancets Active. PROzac (20MG  Capsule, Oral) Active. Temazepam (15MG  Capsule, Oral) Active. Mirapex (0.5MG  Tablet, Oral) Active. Estring (2MG  Ring, Vaginal) Active. Symbicort (160-4.5MCG/ACT Aerosol, Inhalation) Active. Atenolol (25MG  Tablet, Oral) Active. Amaryl (4MG  Tablet, Oral) Active. Victoza (Subcutaneous) Specific strength unknown - Active. Lisinopril (20MG  Tablet, Oral) Active. Vitamin B12 (1000MCG Tablet ER, Oral) Active. Diprolene (0.05% Cream, External) Active. Isometheptene-Caffeine-APAP (65-20-325MG  Tablet, Oral) Active. Fish Oil (Oral) Specific strength unknown - Active. Flax Seed Oil (Oral) Specific strength unknown - Active. Lumigan (0.03% Solution, Ophthalmic) Active. Cosopt (22.3-6.8MG /ML Solution, Ophthalmic) Active. Citrucel (500MG  Tablet, Oral) Active.  Past Surgical History Dilation and Curettage of Uterus  Hysterectomy  partial (non-cancerous) Gallbladder Surgery  laporoscopic Mastectomy - Both  Mastectomy - Bilateral  bilateral Total Knee Replacement - Right  Cholecystectomy  Tonsillectomy  Thyroidectomy; Total  Ovary Removal - Both  Laparotomy  Adenoidectomy  Colonoscopy  Shoulder Hemiarthroplasty  Foot Surgery  Cataract removal    Review of Systems General Present- Tiredness and Weight Gain. Not Present- Chills, Fatigue, Fever, Memory Loss,  Night Sweats and Weight Loss. Skin Present- Eczema and Hair Loss. Not Present- Hives, Itching, Lesions and Rash. HEENT Present- Glaucoma and Headache. Not Present- Dentures, Double Vision, Hearing Loss, Tinnitus and Visual Loss. Respiratory Present- Shortness of breath, Shortness of breath with exertion and Wheezing. Not Present- Allergies, Chronic Cough, Coughing up blood and Shortness of breath at rest. Cardiovascular Present- Hypertension. Not Present- Chest Pain, Difficulty Breathing Lying Down, Murmur, Palpitations, Racing/skipping heartbeats and Swelling. Gastrointestinal Present- Bloating, Diarrhea and Hemorrhoids. Not Present- Abdominal Pain, Bloody Stool, Constipation, Difficulty Swallowing, Heartburn, Jaundice, Loss of appetitie, Nausea and Vomiting. Female Genitourinary Present- Loss of bladder control. Not Present- Blood in Urine, Discharge, Flank Pain, Incontinence, Painful Urination, Urgency, Urinary frequency, Urinary Retention, Urinating at Night and Weak urinary stream. Musculoskeletal Present- Joint Pain. Not Present- Back Pain, Joint Swelling, Morning Stiffness, Muscle Pain, Muscle Weakness and Spasms. Neurological Not Present- Blackout spells, Difficulty with balance, Dizziness, Paralysis, Tremor and Weakness. Psychiatric Present- Anxiety and Depression. Not Present- Insomnia. Hematology Present- Anemia.  Vitals  Weight: 259 lb Height: 72in Body Surface Area: 2.38 m Body Mass Index: 35.13 kg/m  Pulse: 84 (Regular)  BP: 128/72 (Sitting, Right Arm, Standard)   Physical Exam  General Mental Status -Alert, cooperative and good historian. General Appearance-pleasant, Not in acute distress. Orientation-Oriented X3. Build & Nutrition-Well nourished and Well developed.  Head and Neck Head-normocephalic, atraumatic . Neck Global Assessment - supple, no bruit auscultated on the right, no bruit auscultated on the left.  Eye Pupil - Bilateral-Regular and  Round. Motion - Bilateral-EOMI.  Chest and Lung Exam Auscultation Breath sounds - clear at anterior chest wall and clear at posterior chest wall. Adventitious sounds - No Adventitious sounds.  Cardiovascular Auscultation Rhythm - Regular rate and rhythm. Heart Sounds - S1 WNL and S2 WNL. Murmurs & Other Heart Sounds - Auscultation of the heart reveals - No Murmurs.  Abdomen Palpation/Percussion Tenderness - Abdomen is non-tender to palpation. Rigidity (guarding) - Abdomen  is soft. Auscultation Auscultation of the abdomen reveals - Bowel sounds normal.  Female Genitourinary Note: Not done, not pertinent to present illness   Musculoskeletal Note: On exam, she is in no distress. Her left knee shows no effusion. Range is about 5 to 130. She is tender lateral and medial. There is marked crepitus on range of motion with no instability. Her replaced knee on the right has range 0 to 125 and no tenderness or instability.  DIAGNOSTIC IMAGING Radiographs were reviewed, AP and lateral of left knee. She has got bone-on-bone arthritis, lateral and patellofemoral with some medial narrowing also.   Assessment & Plan Status post total right knee replacement (G95.621) Primary osteoarthritis of left knee (M17.12)  Note:Surgical Plans: Left Total Knee Replacement  Disposition: Home, In-Home [facility]  PCP: Dr. Ansel Bong Cards: Dr. Derl Barrow Pulm: Dr. Lake Bells  Topical TXA - History of Breast Cancer  Anesthesia Issues: No  Patient was instructed on what medications to stop prior to surgery.  Signed electronically by Joelene Millin, III PA-C

## 2016-11-29 NOTE — Op Note (Signed)
OPERATIVE REPORT-TOTAL KNEE ARTHROPLASTY   Pre-operative diagnosis- Osteoarthritis  Left knee(s)  Post-operative diagnosis- Osteoarthritis Left knee(s)  Procedure-  Left  Total Knee Arthroplasty  Surgeon- Olivia Plover. Husein Guedes, MD  Assistant- Ardeen Jourdain, PA-C   Anesthesia-  Adductor canal block and spinal  EBL-* No blood loss amount entered *   Drains Hemovac  Tourniquet time-  Total Tourniquet Time Documented: Thigh (Left) - 34 minutes Total: Thigh (Left) - 34 minutes     Complications- None  Condition-PACU - hemodynamically stable.   Brief Clinical Note  [name] is a 76 y.o. year old female with end stage OA of her left knee with progressively worsening pain and dysfunction. She has constant pain, with activity and at rest and significant functional deficits with difficulties even with ADLs. She has had extensive non-op management including analgesics, injections of cortisone and viscosupplements, and home exercise program, but remains in significant pain with significant dysfunction. Radiographs show bone on bone arthritis all 3 compartments. She presents now for left Total Knee Arthroplasty.    Procedure in detail---   The patient is brought into the operating room and positioned supine on the operating table. After successful administration of  Adductor canal block and spinal,   a tourniquet is placed high on the  Left thigh(s) and the lower extremity is prepped and draped in the usual sterile fashion. Time out is performed by the operating team and then the  Left lower extremity is wrapped in Esmarch, knee flexed and the tourniquet inflated to 300 mmHg.       A midline incision is made with a ten blade through the subcutaneous tissue to the level of the extensor mechanism. A fresh blade is used to make a medial parapatellar arthrotomy. Soft tissue over the proximal medial tibia is subperiosteally elevated to the joint line with a knife and into the semimembranosus  bursa with a Cobb elevator. Soft tissue over the proximal lateral tibia is elevated with attention being paid to avoiding the patellar tendon on the tibial tubercle. The patella is everted, knee flexed 90 degrees and the ACL and PCL are removed. Findings are bone on bone all 3 compartments with massive global osteophytes.        The drill is used to create a starting hole in the distal femur and the canal is thoroughly irrigated with sterile saline to remove the fatty contents. The 5 degree Left  valgus alignment guide is placed into the femoral canal and the distal femoral cutting block is pinned to remove 10 mm off the distal femur. Resection is made with an oscillating saw.      The tibia is subluxed forward and the menisci are removed. The extramedullary alignment guide is placed referencing proximally at the medial aspect of the tibial tubercle and distally along the second metatarsal axis and tibial crest. The block is pinned to remove 62mm off the more deficient lateral  side. Resection is made with an oscillating saw. Size 4is the most appropriate size for the tibia and the proximal tibia is prepared with the modular drill and keel punch for that size.      The femoral sizing guide is placed and size 5 is most appropriate. Rotation is marked off the epicondylar axis and confirmed by creating a rectangular flexion gap at 90 degrees. The size 5 cutting block is pinned in this rotation and the anterior, posterior and chamfer cuts are made with the oscillating saw. The intercondylar block is then placed and  that cut is made.      Trial size 4 tibial component, trial size 5 posterior stabilized femur and a 10  mm posterior stabilized rotating platform insert trial is placed. Full extension is achieved with excellent varus/valgus and anterior/posterior balance throughout full range of motion. The patella is everted and thickness measured to be 22  mm. Free hand resection is taken to 12 mm, a 38 template is  placed, lug holes are drilled, trial patella is placed, and it tracks normally. Osteophytes are removed off the posterior femur with the trial in place. All trials are removed and the cut bone surfaces prepared with pulsatile lavage. Cement is mixed and once ready for implantation, the size 4 tibial implant, size  5 posterior stabilized femoral component, and the size 38 patella are cemented in place and the patella is held with the clamp. The trial insert is placed and the knee held in full extension. The Exparel (20 ml mixed with 60 ml saline) is injected into the extensor mechanism, posterior capsule, medial and lateral gutters and subcutaneous tissues.  All extruded cement is removed and once the cement is hard the permanent 10 mm posterior stabilized rotating platform insert is placed into the tibial tray.      The wound is copiously irrigated with saline solution and the extensor mechanism closed over a hemovac drain with #1 V-loc suture. The tourniquet is released for a total tourniquet time of 34  minutes. Flexion against gravity is 140 degrees and the patella tracks normally. Subcutaneous tissue is closed with 2.0 vicryl and subcuticular with running 4.0 Monocryl. The incision is cleaned and dried and steri-strips and a bulky sterile dressing are applied. The limb is placed into a knee immobilizer and the patient is awakened and transported to recovery in stable condition.      Please note that a surgical assistant was a medical necessity for this procedure in order to perform it in a safe and expeditious manner. Surgical assistant was necessary to retract the ligaments and vital neurovascular structures to prevent injury to them and also necessary for proper positioning of the limb to allow for anatomic placement of the prosthesis.   Olivia Plover Mayleigh Tetrault, MD    11/29/2016, 12:21 PM

## 2016-11-29 NOTE — Interval H&P Note (Signed)
History and Physical Interval Note:  11/29/2016 10:11 AM  [name]  has presented today for surgery, with the diagnosis of Osteoarthritis Left Knee  The various methods of treatment have been discussed with the patient and family. After consideration of risks, benefits and other options for treatment, the patient has consented to  Procedure(s): LEFT TOTAL KNEE ARTHROPLASTY (Left) as a surgical intervention .  The patient's history has been reviewed, patient examined, no change in status, stable for surgery.  I have reviewed the patient's chart and labs.  Questions were answered to the patient's satisfaction.     [name]

## 2016-11-29 NOTE — Anesthesia Procedure Notes (Signed)
Performed by: Ramiyah Mcclenahan E       

## 2016-11-29 NOTE — Anesthesia Postprocedure Evaluation (Signed)
Anesthesia Post Note  Patient: [name]  Procedure(s) Performed: Procedure(s) (LRB): LEFT TOTAL KNEE ARTHROPLASTY (Left)     Patient location during evaluation: PACU Anesthesia Type: Spinal Level of consciousness: oriented and awake and alert Pain management: pain level controlled Vital Signs Assessment: post-procedure vital signs reviewed and stable Respiratory status: spontaneous breathing, respiratory function stable and patient connected to nasal cannula oxygen Cardiovascular status: blood pressure returned to baseline and stable Postop Assessment: no headache and no backache Anesthetic complications: no    Last Vitals:  Vitals:   11/29/16 1309 11/29/16 1315  BP:    Pulse: 82 82  Resp: 17 17  Temp:      Last Pain:  Vitals:   11/29/16 0947  TempSrc: Oral    LLE Motor Response: No movement due to regional block (11/29/16 1315) LLE Sensation: No sensation (absent) (11/29/16 1315) RLE Motor Response: No movement due to regional block (11/29/16 1315) RLE Sensation: No sensation (absent) (11/29/16 1315) L Sensory Level: L1-Inguinal (groin) region (11/29/16 1315) R Sensory Level: L1-Inguinal (groin) region (11/29/16 1315)  Shonia Skilling S

## 2016-11-29 NOTE — Anesthesia Procedure Notes (Signed)
Anesthesia Regional Block: Adductor canal block   Pre-Anesthetic Checklist: ,, timeout performed, Correct Patient, Correct Site, Correct Laterality, Correct Procedure, Correct Position, site marked, Risks and benefits discussed,  Surgical consent,  Pre-op evaluation,  At surgeon's request and post-op pain management  Laterality: Left  Prep: chloraprep       Needles:  Injection technique: Single-shot  Needle Type: Echogenic Needle     Needle Length: 9cm      Additional Needles:   Procedures: ultrasound guided,,,,,,,,  Narrative:  Start time: 11/29/2016 10:31 AM End time: 11/29/2016 10:40 AM Injection made incrementally with aspirations every 5 mL.  Performed by: Personally  Anesthesiologist: Symphani Eckstrom  Additional Notes: Patient tolerated the procedure well without complications

## 2016-11-29 NOTE — Anesthesia Procedure Notes (Signed)
Spinal  Patient location during procedure: OR Start time: 11/29/2016 11:10 AM End time: 11/29/2016 11:20 AM Staffing Anesthesiologist: Alla Sloma, Iona Beard Performed: anesthesiologist  Preanesthetic Checklist Completed: patient identified, site marked, surgical consent, pre-op evaluation, timeout performed, IV checked, risks and benefits discussed and monitors and equipment checked Spinal Block Patient position: sitting Prep: Betadine Patient monitoring: heart rate, continuous pulse ox and blood pressure Injection technique: single-shot Needle Needle type: Whitacre  Needle gauge: 22 G Needle length: 9 cm Additional Notes Expiration date of kit checked and confirmed. Patient tolerated procedure well, without complications.

## 2016-11-30 LAB — BASIC METABOLIC PANEL
Anion gap: 6 (ref 5–15)
BUN: 15 mg/dL (ref 6–20)
CHLORIDE: 103 mmol/L (ref 101–111)
CO2: 27 mmol/L (ref 22–32)
CREATININE: 0.67 mg/dL (ref 0.44–1.00)
Calcium: 8.2 mg/dL — ABNORMAL LOW (ref 8.9–10.3)
GFR calc Af Amer: >60 mL/min (ref >60)
GFR calc non Af Amer: >60 mL/min (ref >60)
GLUCOSE: 206 mg/dL — AB (ref 65–99)
Potassium: 4.5 mmol/L (ref 3.5–5.1)
Sodium: 136 mmol/L (ref 135–145)

## 2016-11-30 LAB — CBC
HEMATOCRIT: 32.2 % — AB (ref 36.0–46.0)
HEMOGLOBIN: 10.4 g/dL — AB (ref 12.0–15.0)
MCH: 29.4 pg (ref 26.0–34.0)
MCHC: 32.3 g/dL (ref 30.0–36.0)
MCV: 91 fL (ref 78.0–100.0)
Platelets: 196 10*3/uL (ref 150–400)
RBC: 3.54 MIL/uL — ABNORMAL LOW (ref 3.87–5.11)
RDW: 13.7 % (ref 11.5–15.5)
WBC: 11.8 10*3/uL — ABNORMAL HIGH (ref 4.0–10.5)

## 2016-11-30 LAB — GLUCOSE, CAPILLARY
GLUCOSE-CAPILLARY: 183 mg/dL — AB (ref 65–99)
GLUCOSE-CAPILLARY: 148 mg/dL — AB (ref 65–99)
Glucose-Capillary: 190 mg/dL — ABNORMAL HIGH (ref 65–99)
Glucose-Capillary: 129 mg/dL — ABNORMAL HIGH (ref 65–99)

## 2016-11-30 MED ORDER — RIVAROXABAN 10 MG PO TABS: 10.0000 mg | ORAL_TABLET | Freq: Every day | ORAL | 0 refills | Status: DC

## 2016-11-30 MED ORDER — SODIUM CHLORIDE 0.9 % IV BOLUS (SEPSIS)
250.0000 mL | Freq: Once | INTRAVENOUS | Status: AC
  Administered 2016-11-30: 250 mL via INTRAVENOUS

## 2016-11-30 MED ORDER — METHOCARBAMOL 500 MG PO TABS: 500.0000 mg | ORAL_TABLET | Freq: Four times a day (QID) | ORAL | 0 refills | Status: DC | PRN

## 2016-11-30 MED ORDER — FLUCONAZOLE 150 MG PO TABS
150.0000 mg | ORAL_TABLET | Freq: Once | ORAL | Status: AC
  Administered 2016-11-30: 150 mg via ORAL
  Filled 2016-11-30: qty 1

## 2016-11-30 MED ORDER — HYDROMORPHONE HCL 2 MG PO TABS: 2.0000 mg | ORAL_TABLET | ORAL | 0 refills | Status: DC | PRN

## 2016-11-30 NOTE — Progress Notes (Signed)
Physical Therapy Treatment Patient Details Name: [name] MRN: [id number] DOB: 12-18-1940 Today's Date: 11/30/2016    History of Present Illness s/p L TKA, h/o R TKA '16; peripheral neuropathy, DM, anxiety    PT Comments    <Much difficulty standing from low recliner. Patient reports having recliner on blocks and sleeps in recliner. Plans DC tomorrow.   Follow Up Recommendations  DC plan and follow up therapy as arranged by surgeon [name])     Equipment Recommendations  None recommended by PT    Recommendations for Other Services       Precautions / Restrictions Precautions Precautions: Knee;Fall Required Braces or Orthoses: Knee Immobilizer - Left Knee Immobilizer - Left: Discontinue once straight leg raise with < 10 degree lag    Mobility  Bed Mobility   Bed Mobility: Sit to Supine       Sit to supine: Min assist   General bed mobility comments: assist with left leg  Transfers Overall transfer level: Needs assistance Equipment used: Rolling walker (2 wheeled) Transfers: Sit to/from Stand Sit to Stand: Max assist;+2 physical assistance;+2 safety/equipment         General transfer comment: much difficulty from recliner, required 2 assist to stand. did better from [facility] height.   Ambulation/Gait Ambulation/Gait assistance: Min assist Ambulation Distance (Feet): 90 Feet Assistive device: Rolling walker (2 wheeled) Gait Pattern/deviations: Step-to pattern;Step-through pattern     General Gait Details: cues for sequence, safety, tends to hit right foot as it turns outward.   Stairs            Wheelchair Mobility    Modified Rankin (Stroke Patients Only)       Balance                                            Cognition Arousal/Alertness: Awake/alert                                            Exercises    General Comments        Pertinent Vitals/Pain Pain Score: 3  Pain Location: L knee   Pain Descriptors / Indicators: Aching;Sore Pain Intervention(s): Monitored during session;Premedicated before session;Repositioned;Ice applied    Home Living                      Prior Function Level of Independence: Independent with assistive device(s)          PT Goals (current goals can now be found in the care plan section) Acute Rehab PT Goals Patient Stated Goal: home tomorrow PT Goal Formulation: With patient/family Time For Goal Achievement: 12/03/16 Potential to Achieve Goals: Good Progress towards PT goals: Progressing toward goals    Frequency    7X/week      PT Plan Current plan remains appropriate    Co-evaluation              AM-PAC PT "6 Clicks" Daily Activity  Outcome Measure  Difficulty turning over in bed (including adjusting bedclothes, sheets and blankets)?: Total Difficulty moving from lying on back to sitting on the side of the bed? : Total Difficulty sitting down on and standing up from a chair with arms (e.g., wheelchair, bedside commode, etc,.)?: Total Help needed moving to  and from a bed to chair (including a wheelchair)?: Total Help needed walking in hospital room?: Total Help needed climbing 3-5 steps with a railing? : Total 6 Click Score: 6    End of Session Equipment Utilized During Treatment: Left knee immobilizer Activity Tolerance: Patient tolerated treatment well Patient left: in bed;with call bell/phone within reach;with family/visitor present Nurse Communication: Mobility status PT Visit Diagnosis: Difficulty in walking, not elsewhere classified (R26.2);Pain Pain - Right/Left: Left Pain - part of body: Knee     Time: 1446-1510 PT Time Calculation (min) (ACUTE ONLY): 24 min  Charges:  $Gait Training: 23-37 mins $Therapeutic Exercise: 8-22 mins                    G CodesTresa Hahn PT [phone]    [name] 11/30/2016, 5:06 PM

## 2016-11-30 NOTE — Evaluation (Signed)
Occupational Therapy Evaluation Patient Details Name: [name] MRN: [id number] DOB: [birth year] Today's Date: 11/30/2016    History of Present Illness s/p L TKA, h/o R TKA '16; peripheral neuropathy, DM, anxiety   Clinical Impression   This 76 year old female was admitted for the above sx. She was mod I with adls prior to admission. She has a Secondary school teacher and husband will also assist at home with adls. Pt will benefit from continued OT in acute setting to increase safety and independence with adls as well as toilet transfers. Goals are for min guard to min A    Follow Up Recommendations  Supervision/Assistance - 24 hour;DC plan and follow up therapy as arranged by surgeon    Equipment Recommendations  None recommended by OT    Recommendations for Other Services       Precautions / Restrictions Precautions Precautions: Knee;Fall Restrictions Weight Bearing Restrictions: No      Mobility Bed Mobility Overal bed mobility: Needs Assistance Bed Mobility: Supine to Sit     Supine to sit: Min assist;HOB elevated     General bed mobility comments: for LLE  Transfers Overall transfer level: Needs assistance Equipment used: Rolling walker (2 wheeled) Transfers: Sit to/from Stand Sit to Stand: Min assist;From elevated surface         General transfer comment: cues for UE/LE placement.  Assist to rise and stabilize. Transitions are slow; cues to weight shift forward    Balance                                           ADL either performed or assessed with clinical judgement   ADL Overall ADL's : Needs assistance/impaired     Grooming: Oral care;Set up;Sitting   Upper Body Bathing: Set up;Sitting   Lower Body Bathing: Moderate assistance;Sit to/from stand   Upper Body Dressing : Minimal assistance;Standing   Lower Body Dressing: Maximal assistance;Sit to/from stand   Toilet Transfer: Minimal assistance;Ambulation;BSC;RW              General ADL Comments: Pt has catheter in place. Walked to bathroom and practiced transfer onto commode.  Pt has a Secondary school teacher at home     Vision         Perception     Praxis      Pertinent Vitals/Pain Pain Assessment: 0-10 Pain Score: 5  Pain Location: L knee  Pain Descriptors / Indicators: Aching Pain Intervention(s): Limited activity within patient's tolerance;Monitored during session;Premedicated before session;Repositioned     Hand Dominance     Extremity/Trunk Assessment Upper Extremity Assessment Upper Extremity Assessment: Overall WFL for tasks assessed       Cervical / Trunk Assessment Cervical / Trunk Assessment:  (pt states she has a "weak back"; flexed posture)   Communication Communication Communication: No difficulties   Cognition Arousal/Alertness: Awake/alert Behavior During Therapy: WFL for tasks assessed/performed Overall Cognitive Status: Within Functional Limits for tasks assessed                                     General Comments       Exercises     Shoulder Instructions      Home Living Family/patient expects to be discharged to:: Private residence Living Arrangements: Spouse/significant other Available Help at Discharge: Family  Bathroom Shower/Tub: Teacher, early years/pre: Standard     Home Equipment: Tub bench;Walker - 2 wheels;Bedside commode          Prior Functioning/Environment Level of Independence: Independent with assistive device(s)                 OT Problem List: Decreased activity tolerance;Decreased strength;Pain      OT Treatment/Interventions: Self-care/ADL training;DME and/or AE instruction;Therapeutic activities;Patient/family education    OT Goals(Current goals can be found in the care plan section) Acute Rehab OT Goals Patient Stated Goal: home tomorrow OT Goal Formulation: With patient Time For Goal Achievement: 12/07/16 Potential to Achieve Goals:  Good ADL Goals Pt Will Perform Lower Body Bathing: with min assist;sit to/from stand;with adaptive equipment Pt Will Perform Lower Body Dressing: with min assist;with adaptive equipment;sit to/from stand (pants/underwear) Pt Will Transfer to Toilet: with min guard assist;ambulating;bedside commode Pt Will Perform Toileting - Clothing Manipulation and hygiene: with min assist;sit to/from stand  OT Frequency: Min 2X/week   Barriers to D/C:            Co-evaluation              AM-PAC PT "6 Clicks" Daily Activity     Outcome Measure Help from another person eating meals?: None Help from another person taking care of personal grooming?: A Little Help from another person toileting, which includes using toliet, bedpan, or urinal?: A Little Help from another person bathing (including washing, rinsing, drying)?: A Lot Help from another person to put on and taking off regular upper body clothing?: A Little Help from another person to put on and taking off regular lower body clothing?: A Lot 6 Click Score: 17   End of Session    Activity Tolerance: Patient tolerated treatment well Patient left:  (handed off to PT)  OT Visit Diagnosis: Pain Pain - Right/Left: Left Pain - part of body: Knee                Time: 4287-6811 OT Time Calculation (min): 22 min Charges:  OT General Charges $OT Visit: 1 Procedure OT Evaluation $OT Eval Low Complexity: 1 Procedure G-Codes:     [place], OTR/L [phone] 11/30/2016  [name] 11/30/2016, 12:30 PM

## 2016-11-30 NOTE — Progress Notes (Signed)
Subjective: 1 Day Post-Op Procedure(s) (LRB): LEFT TOTAL KNEE ARTHROPLASTY (Left) Patient reports pain as mild.   Patient seen in rounds for Dr. Wynelle Link. Patient is well, but has had some minor complaints of pain in the knee, requiring pain medications We will start therapy today.  Plan is to go Home after hospital stay.  Objective: Vital signs in last 24 hours: Temp:  [97.8 F (36.6 C)-98.8 F (37.1 C)] 98.1 F (36.7 C) (07/24 2038) Pulse Rate:  [70-85] 78 (07/24 2038) Resp:  [16-18] 18 (07/24 2038) BP: (91-123)/(44-76) 118/48 (07/24 2038) SpO2:  [93 %-95 %] 94 % (07/24 2038)  Intake/Output from previous day:  Intake/Output Summary (Last 24 hours) at 11/30/16 2103 Last data filed at 11/30/16 2038  Gross per 24 hour  Intake             1965 ml  Output             1500 ml  Net              465 ml    Intake/Output this shift: Total I/O In: 300 [P.O.:300] Out: 400 [Urine:400]  Labs:  Recent Labs  11/30/16 0517  HGB 10.4*    Recent Labs  11/30/16 0517  WBC 11.8*  RBC 3.54*  HCT 32.2*  PLT 196    Recent Labs  11/30/16 0517  NA 136  K 4.5  CL 103  CO2 27  BUN 15  CREATININE 0.67  GLUCOSE 206*  CALCIUM 8.2*   No results for input(s): LABPT, INR in the last 72 hours.  EXAM General - Patient is Alert, Appropriate and Oriented Extremity - Neurovascular intact Sensation intact distally Intact pulses distally Dorsiflexion/Plantar flexion intact Dressing - dressing C/D/I Motor Function - intact, moving foot and toes well on exam.  Hemovac pulled without difficulty.  Past Medical History:  Diagnosis Date  . Arthritis    right knee;injections every 29months   . Asthma   . Bronchiectasis ([place])   . Cancer ([facility])    HX BREAST CANCER  . COMPRESSION FRACTURE, LUMBAR VERTEBRAE 08/21/2008   Qualifier: Diagnosis of  By: Arnoldo Morale MD, Balinda Quails   . Depression   . Diabetes mellitus    takes Amaryl and Januvia daily  . Difficulty sleeping   . Diverticulitis    . Early cataracts, bilateral   . Eczema   . Endometriosis   . Gastritis   . Glaucoma   . Hammer toe   . Headache(784.0)    Migraines  . Hypertension    takes Atenolol daily  . IBS (irritable bowel syndrome)   . Joint pain   . Kidney stone   . Neuropathy   . Open wound of second toe of left foot    at pre-op appt, wound appears clear of infection 1 month post removal of toenail, no obvious exudate present nor any redness,  . Osteomyelitis ([place])    left 2nd toe  . Osteopenia   . PONV (postoperative nausea and vomiting)   . Posterior tibial tendon dysfunction    left foot  . Scoliosis   . Severe esophageal dysplasia   . 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 Vitamin D deficiency    takes Vit d daily    Assessment/Plan: 1 Day Post-Op Procedure(s) (LRB): LEFT TOTAL KNEE ARTHROPLASTY (Left) Principal Problem:   OA (osteoarthritis) of knee  Estimated body mass index is 34.86 kg/m as calculated from the following:  Height as of this encounter: 6' (1.829 m).   Weight as of this encounter: 116.6 kg (257 lb). Advance diet Up with therapy Plan for discharge tomorrow  Plan for In-Home VERA Therapy  DVT Prophylaxis - Xarelto Weight-Bearing as tolerated to left leg D/C O2 and Pulse OX and try on Room Air  Arlee Muslim, PA-C Orthopaedic Surgery 11/30/2016, 9:03 PM

## 2016-11-30 NOTE — Discharge Summary (Signed)
Physician Discharge Summary   Patient ID: [name] MRN: [id number] DOB/AGE: 1941/04/24 76 y.o.  Admit date: 11/29/2016 Discharge date: 12/02/2016  Primary Diagnosis:  Osteoarthritis  Left knee(s)  Admission Diagnoses:  Past Medical History:  Diagnosis Date  . Arthritis    right knee;injections every 75month   . Asthma   . [place]ct[place]   . Cancer (HCC)    HX BREAST CANCER  . COMPRESSION FRACTURE, LUMBAR VERTEBRAE 08/21/2008   Qualifier: Diagnosis of  By: JArnoldo MoraleMD, JBalinda Quails  . Depression   . Diabetes mellitus    takes Amaryl and Januvia daily  . Difficulty sleeping   . Diverticulitis   . Early cataracts, bilateral   . Eczema   . Endometriosis   . Gastritis   . Glaucoma   . Hammer toe   . Headache(784.0)    Migraines  . Hypertension    takes Atenolol daily  . IBS (irritable bowel syndrome)   . Joint pain   . Kidney stone   . Neuropathy   . Open wound of second toe of left foot    at pre-op appt, wound appears clear of infection 1 month post removal of toenail, no obvious exudate present nor any redness,  . Osteomyelitis ([facility]    left 2nd toe  . Osteopenia   . PONV (postoperative nausea and vomiting)   . Posterior tibial tendon dysfunction    left foot  . Scoliosis   . Severe esophageal dysplasia   . 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 Vitamin D deficiency    takes Vit d daily   Discharge Diagnoses:   Principal Problem:   OA (osteoarthritis) of knee  Estimated body mass index is 34.86 kg/m as calculated from the following:   Height as of this encounter: 6' (1.829 m).   Weight as of this encounter: 116.6 kg (257 lb).  Procedure:  Procedure(s) (LRB): LEFT TOTAL KNEE ARTHROPLASTY (Left)   Consults: None  HPI: Olivia MERTZis a 76y.o. year old female with end stage OA of her left knee with progressively worsening pain and dysfunction. She has constant pain, with activity and at rest and significant  functional deficits with difficulties even with ADLs. She has had extensive non-op management including analgesics, injections of cortisone and viscosupplements, and home exercise program, but remains in significant pain with significant dysfunction. Radiographs show bone on bone arthritis all 3 compartments. She presents now for left Total Knee Arthroplasty.    Laboratory Data: Admission on 11/29/2016  Component Date Value Ref Range Status  . MRSA, PCR 11/29/2016 NEGATIVE  NEGATIVE Final  . Staphylococcus aureus 11/29/2016 NEGATIVE  NEGATIVE Final   Comment:        The Xpert SA Assay (FDA approved for NASAL specimens in patients over 267years of age), is one component of a comprehensive surveillance program.  Test performance has been validated [facility] patients greater than or equal to 125year old. It is not intended to diagnose infection nor to guide or monitor treatment.   . Glucose-Capillary 11/29/2016 146* 65 - 99 mg/dL Final  . Comment 1 11/29/2016 Notify RN   Final  . Glucose-Capillary 11/29/2016 104* 65 - 99 mg/dL Final  . WBC 11/30/2016 11.8* 4.0 - 10.5 K/uL Final  . RBC 11/30/2016 3.54* 3.87 - 5.11 MIL/uL Final  . Hemoglobin 11/30/2016 10.4* 12.0 - 15.0 g/dL Final  . HCT 11/30/2016 32.2* 36.0 - 46.0 %  Final  . MCV 11/30/2016 91.0  78.0 - 100.0 fL Final  . MCH 11/30/2016 29.4  26.0 - 34.0 pg Final  . MCHC 11/30/2016 32.3  30.0 - 36.0 g/dL Final  . RDW 11/30/2016 13.7  11.5 - 15.5 % Final  . Platelets 11/30/2016 196  150 - 400 K/uL Final  . Sodium 11/30/2016 136  135 - 145 mmol/L Final  . Potassium 11/30/2016 4.5  3.5 - 5.1 mmol/L Final  . Chloride 11/30/2016 103  101 - 111 mmol/L Final  . CO2 11/30/2016 27  22 - 32 mmol/L Final  . Glucose, Bld 11/30/2016 206* 65 - 99 mg/dL Final  . BUN 11/30/2016 15  6 - 20 mg/dL Final  . Creatinine, Ser 11/30/2016 0.67  0.44 - 1.00 mg/dL Final  . Calcium 11/30/2016 8.2* 8.9 - 10.3 mg/dL Final  . GFR calc non Af Amer  11/30/2016 >60  >60 mL/min Final  . GFR calc Af Amer 11/30/2016 >60  >60 mL/min Final   Comment: (NOTE) The eGFR has been calculated using the CKD EPI equation. This calculation has not been validated in all clinical situations. eGFR's persistently <60 mL/min signify possible Chronic Kidney Disease.   . Anion gap 11/30/2016 6  5 - 15 Final  . Glucose-Capillary 11/29/2016 316* 65 - 99 mg/dL Final  . Glucose-Capillary 11/30/2016 190* 65 - 99 mg/dL Final  . Glucose-Capillary 11/30/2016 183* 65 - 99 mg/dL Final  . Glucose-Capillary 11/30/2016 129* 65 - 99 mg/dL Final  Hospital Outpatient Visit on 11/24/2016  Component Date Value Ref Range Status  . Glucose-Capillary 11/24/2016 107* 65 - 99 mg/dL Final  . aPTT 11/24/2016 27  24 - 36 seconds Final  . WBC 11/24/2016 8.9  4.0 - 10.5 K/uL Final  . RBC 11/24/2016 4.30  3.87 - 5.11 MIL/uL Final  . Hemoglobin 11/24/2016 12.7  12.0 - 15.0 g/dL Final  . HCT 11/24/2016 38.9  36.0 - 46.0 % Final  . MCV 11/24/2016 90.5  78.0 - 100.0 fL Final  . MCH 11/24/2016 29.5  26.0 - 34.0 pg Final  . MCHC 11/24/2016 32.6  30.0 - 36.0 g/dL Final  . RDW 11/24/2016 14.0  11.5 - 15.5 % Final  . Platelets 11/24/2016 237  150 - 400 K/uL Final  . Sodium 11/24/2016 142  135 - 145 mmol/L Final  . Potassium 11/24/2016 4.4  3.5 - 5.1 mmol/L Final  . Chloride 11/24/2016 105  101 - 111 mmol/L Final  . CO2 11/24/2016 29  22 - 32 mmol/L Final  . Glucose, Bld 11/24/2016 105* 65 - 99 mg/dL Final  . BUN 11/24/2016 16  6 - 20 mg/dL Final  . Creatinine, Ser 11/24/2016 0.67  0.44 - 1.00 mg/dL Final  . Calcium 11/24/2016 9.2  8.9 - 10.3 mg/dL Final  . Total Protein 11/24/2016 6.9  6.5 - 8.1 g/dL Final  . Albumin 11/24/2016 3.7  3.5 - 5.0 g/dL Final  . AST 11/24/2016 19  15 - 41 U/L Final  . ALT 11/24/2016 19  14 - 54 U/L Final  . Alkaline Phosphatase 11/24/2016 75  38 - 126 U/L Final  . Total Bilirubin 11/24/2016 0.5  0.3 - 1.2 mg/dL Final  . GFR calc non Af Amer 11/24/2016  >60  >60 mL/min Final  . GFR calc Af Amer 11/24/2016 >60  >60 mL/min Final   Comment: (NOTE) The eGFR has been calculated using the CKD EPI equation. This calculation has not been validated in all clinical situations. eGFR's persistently <60 mL/min  signify possible Chronic Kidney Disease.   . Anion gap 11/24/2016 8  5 - 15 Final  . Prothrombin Time 11/24/2016 13.0  11.4 - 15.2 seconds Final  . INR 11/24/2016 0.98   Final  . ABO/RH(D) 11/24/2016 O POS   Final  . Antibody Screen 11/24/2016 NEG   Final  . Sample Expiration 11/24/2016 12/02/2016   Final  . Extend sample reason 11/24/2016 NO TRANSFUSIONS OR PREGNANCY IN THE PAST 3 MONTHS   Final  Office Visit on 10/29/2016  Component Date Value Ref Range Status  . Candida Species 10/29/2016 Negative  Negative Final  . Gardnerella vaginalis 10/29/2016 Negative  Negative Final  . Trichomonas vaginosis 10/29/2016 Negative  Negative Final  Lab on 10/21/2016  Component Date Value Ref Range Status  . Hgb A1c MFr Bld 10/21/2016 6.9* 4.6 - 6.5 % Final   Glycemic Control Guidelines for People with Diabetes:Non Diabetic:  <6%Goal of Therapy: <7%Additional Action Suggested:  >8%   . WBC 10/21/2016 7.9  4.0 - 10.5 K/uL Final  . RBC 10/21/2016 4.59  3.87 - 5.11 Mil/uL Final  . Hemoglobin 10/21/2016 13.5  12.0 - 15.0 g/dL Final  . HCT 10/21/2016 41.6  36.0 - 46.0 % Final  . MCV 10/21/2016 90.7  78.0 - 100.0 fl Final  . MCHC 10/21/2016 32.5  30.0 - 36.0 g/dL Final  . RDW 10/21/2016 14.1  11.5 - 15.5 % Final  . Platelets 10/21/2016 287.0  150.0 - 400.0 K/uL Final  . Neutrophils Relative % 10/21/2016 70.1  43.0 - 77.0 % Final  . Lymphocytes Relative 10/21/2016 18.1  12.0 - 46.0 % Final  . Monocytes Relative 10/21/2016 7.2  3.0 - 12.0 % Final  . Eosinophils Relative 10/21/2016 3.8  0.0 - 5.0 % Final  . Basophils Relative 10/21/2016 0.8  0.0 - 3.0 % Final  . Neutro Abs 10/21/2016 5.6  1.4 - 7.7 K/uL Final  . Lymphs Abs 10/21/2016 1.4  0.7 - 4.0 K/uL  Final  . Monocytes Absolute 10/21/2016 0.6  0.1 - 1.0 K/uL Final  . Eosinophils Absolute 10/21/2016 0.3  0.0 - 0.7 K/uL Final  . Basophils Absolute 10/21/2016 0.1  0.0 - 0.1 K/uL Final  . Vitamin B-12 10/21/2016 286  211 - 911 pg/mL Final     X-Rays:No results found.  EKG: Orders placed or performed in visit on 09/06/16  . EKG 12-Lead     Hospital Course: [name] is a 76 y.o. who was admitted to [facility]. They were brought to the operating room on 11/29/2016 and underwent Procedure(s): LEFT TOTAL KNEE ARTHROPLASTY.  Patient tolerated the procedure well and was later transferred to the recovery room and then to the orthopaedic floor for postoperative care.  They were given PO and IV analgesics for pain control following their surgery.  They were given 24 hours of postoperative antibiotics of  Anti-infectives    Start     Dose/Rate Route Frequency Ordered Stop   11/30/16 1000  fluconazole (DIFLUCAN) tablet 150 mg     150 mg Oral  Once 11/30/16 0858 11/30/16 0925   11/29/16 1700  ceFAZolin (ANCEF) IVPB 1 g/50 mL premix     1 g 100 mL/hr over 30 Minutes Intravenous Every 6 hours 11/29/16 1440 11/29/16 2249   11/29/16 0600  ceFAZolin (ANCEF) 3 g in dextrose 5 % 50 mL IVPB     3 g 130 mL/hr over 30 Minutes Intravenous On call to O.R. 11/28/16 1143 11/29/16 1108  and started on DVT prophylaxis in the form of Xarelto.   PT and OT were ordered for total joint protocol.  Discharge planning consulted to help with postop disposition and equipment needs.  Patient had a decent night on the evening of surgery.  They started to get up OOB with therapy on day one. Hemovac drain was pulled without difficulty.  Continued to work with therapy into day two but had some increased pain requiring IV meds.  Dressing was changed on day two and the incision was healing well.  By day three, the patient had progressed with therapy and meeting their goals.  Incision was healing well.  Patient was  seen in rounds by Dr. Wynelle Link and was ready to go home.   Discharge home - Plan for In-Home VERA Therapy Diet - Cardiac diet and Diabetic diet Follow up - in 2 weeks Activity - WBAT Disposition - Home Condition Upon Discharge - Good D/C Meds - See DC Summary DVT Prophylaxis - Xarelto  Discharge Instructions    Call MD / Call 911    Complete by:  As directed    If you experience chest pain or shortness of breath, CALL 911 and be transported to the hospital emergency room.  If you develope a fever above 101 F, pus (white drainage) or increased drainage or redness at the wound, or calf pain, call your surgeon's office.   Change dressing    Complete by:  As directed    Change dressing daily with sterile 4 x 4 inch gauze dressing and apply TED hose. Do not submerge the incision under water.   Constipation Prevention    Complete by:  As directed    Drink plenty of fluids.  Prune juice may be helpful.  You may use a stool softener, such as Colace (over the counter) 100 mg twice a day.  Use MiraLax (over the counter) for constipation as needed.   Diet - low sodium heart healthy    Complete by:  As directed    Discharge instructions    Complete by:  As directed    Take Xarelto for two and a half more weeks, then discontinue Xarelto. Once the patient has completed the blood thinner regimen, then take a Baby 81 mg Aspirin daily for three more weeks.   Pick up stool softner and laxative for home use following surgery while on pain medications. Do not submerge incision under water. Please use good hand washing techniques while changing dressing each day. May shower starting three days after surgery. Please use a clean towel to pat the incision dry following showers. Continue to use ice for pain and swelling after surgery. Do not use any lotions or creams on the incision until instructed by your surgeon.  Wear both TED hose on both legs during the day every day for three weeks, but may remove  the TED hose at night at home.  Postoperative Constipation Protocol  Constipation - defined medically as fewer than three stools per week and severe constipation as less than one stool per week.  One of the most common issues patients have following surgery is constipation.  Even if you have a regular bowel pattern at home, your normal regimen is likely to be disrupted due to multiple reasons following surgery.  Combination of anesthesia, postoperative narcotics, change in appetite and fluid intake all can affect your bowels.  In order to avoid complications following surgery, here are some recommendations in order to help you during your recovery  period.  Colace (docusate) - Pick up an over-the-counter form of Colace or another stool softener and take twice a day as long as you are requiring postoperative pain medications.  Take with a full glass of water daily.  If you experience loose stools or diarrhea, hold the colace until you stool forms back up.  If your symptoms do not get better within 1 week or if they get worse, check with your doctor.  Dulcolax (bisacodyl) - Pick up over-the-counter and take as directed by the product packaging as needed to assist with the movement of your bowels.  Take with a full glass of water.  Use this product as needed if not relieved by Colace only.   MiraLax (polyethylene glycol) - Pick up over-the-counter to have on hand.  MiraLax is a solution that will increase the amount of water in your bowels to assist with bowel movements.  Take as directed and can mix with a glass of water, juice, soda, coffee, or tea.  Take if you go more than two days without a movement. Do not use MiraLax more than once per day. Call your doctor if you are still constipated or irregular after using this medication for 7 days in a row.  If you continue to have problems with postoperative constipation, please contact the office for further assistance and recommendations.  If you  experience "the worst abdominal pain ever" or develop nausea or vomiting, please contact the office immediatly for further recommendations for treatment.   Do not put a pillow under the knee. Place it under the heel.    Complete by:  As directed    Do not sit on low chairs, stoools or toilet seats, as it may be difficult to get up from low surfaces    Complete by:  As directed    Driving restrictions    Complete by:  As directed    No driving until released by the physician.   Increase activity slowly as tolerated    Complete by:  As directed    Lifting restrictions    Complete by:  As directed    No lifting until released by the physician.   Patient may shower    Complete by:  As directed    You may shower without a dressing once there is no drainage.  Do not wash over the wound.  If drainage remains, do not shower until drainage stops.   TED hose    Complete by:  As directed    Use stockings (TED hose) for 3 weeks on both leg(s).  You may remove them at night for sleeping.   Weight bearing as tolerated    Complete by:  As directed    Laterality:  left   Extremity:  Lower     Allergies as of 11/30/2016      Reactions   Cephalexin Diarrhea   Patient can't remember reaction (per chart at [facility] states diarrhea)   Erythromycin Ethylsuccinate Hives, Diarrhea   Levaquin [levofloxacin In D5w] Other (See Comments)   Pain in tendons    Nitrofurantoin Other (See Comments)   Severe headache   Percocet [oxycodone-acetaminophen] Itching      Medication List    STOP taking these medications   COQ10 PO   cyanocobalamin 1000 MCG/ML injection Commonly known as:  (VITAMIN B-12)   estradiol 2 MG vaginal ring Commonly known as:  ESTRING   FISH OIL PO   FLAX SEED OIL PO   multivitamin with minerals Tabs  tablet   vitamin B-12 1000 MCG tablet Commonly known as:  CYANOCOBALAMIN     TAKE these medications   atenolol 25 MG tablet Commonly known as:  TENORMIN Take 1 tablet (25 mg  total) by mouth daily.   betamethasone dipropionate 0.05 % cream Commonly known as:  DIPROLENE Apply 1 application topically 2 (two) times daily as needed (dermatitis).   budesonide-formoterol 160-4.5 MCG/ACT inhaler Commonly known as:  SYMBICORT Inhale 2 puffs into the lungs 2 (two) times daily.   CITRUCEL 500 MG Tabs Generic drug:  Methylcellulose (Laxative) Take 2 tablets by mouth at bedtime.   dorzolamide-timolol 22.3-6.8 MG/ML ophthalmic solution Commonly known as:  COSOPT Place 1 drop into both eyes 2 (two) times daily.   FLUoxetine 40 MG capsule Commonly known as:  PROZAC Take 40 mg by mouth daily.   FLUTTER Devi Use as directed   glimepiride 4 MG tablet Commonly known as:  AMARYL TAKE ONE TABLET BY MOUTH ONCE DAILY WITH BREAKFAST What changed:  how much to take  how to take this  when to take this  additional instructions   glucose blood test strip Commonly known as:  FREESTYLE LITE USE TO CHECK BLOOD SUGAR DAILY AND AS NEEDED   HYDROmorphone 2 MG tablet Commonly known as:  DILAUDID Take 1-2 tablets (2-4 mg total) by mouth every 4 (four) hours as needed for severe pain.   IMODIUM PO Take 1 tablet by mouth daily.   Isometheptene-Caffeine-APAP 65-20-325 MG Tabs Take 1-2 tablets as needed for migraine twice a day maximum What changed:  how much to take  how to take this  when to take this  reasons to take this  additional instructions   KLS ALLER-TEC 10 MG tablet Generic drug:  cetirizine Take 10 mg by mouth daily.   lisinopril 10 MG tablet Commonly known as:  PRINIVIL,ZESTRIL Take 1 tablet (10 mg total) by mouth daily. What changed:  when to take this   LUMIGAN 0.01 % Soln Generic drug:  bimatoprost Place 1 drop into both eyes at bedtime.   methocarbamol 500 MG tablet Commonly known as:  ROBAXIN Take 1 tablet (500 mg total) by mouth every 6 (six) hours as needed for muscle spasms.   pramipexole 0.5 MG tablet Commonly known as:   MIRAPEX Take 1 tablet (0.5 mg total) by mouth at bedtime.   rivaroxaban 10 MG Tabs tablet Commonly known as:  XARELTO Take 1 tablet (10 mg total) by mouth daily with breakfast. Take Xarelto for two and a half more weeks following discharge from the hospital, then discontinue Xarelto. Once the patient has completed the blood thinner regimen, then take a Baby 81 mg Aspirin daily for three more weeks.   temazepam 30 MG capsule Commonly known as:  RESTORIL Take 1 capsule (30 mg total) by mouth at bedtime as needed. for sleep What changed:  when to take this  additional instructions   VICTOZA 18 MG/3ML Sopn Generic drug:  liraglutide INJECT 1.'8MG'$  INTO THE SKIN DAILY      Follow-up Information    Gaynelle Arabian, MD. Schedule an appointment as soon as possible for a visit on 12/14/2016.   Specialty:  Orthopedic Surgery Contact information: [address] [place] [address] [phone]           Signed: Arlee Muslim, PA-C Orthopaedic Surgery 11/30/2016, 9:15 PM

## 2016-11-30 NOTE — Care Management Note (Signed)
Case Management Note  Patient Details  Name: [name] MRN: [id number] Date of Birth: 1940-09-12  Subjective/Objective:75 y/o f admitted w/OA L knee. POD#1 LTKA. From home. Per orthopedics CM Curley Spice has dme,scheduled for virtual PT. No further CM needs.                    Action/Plan:d/c plan home.   Expected Discharge Date:                  Expected Discharge Plan:  OP Rehab  In-House Referral:     Discharge planning Services  CM Consult  Post Acute Care Choice:    Choice offered to:     DME Arranged:    DME Agency:     HH Arranged:    HH Agency:     Status of Service:  In process, will continue to follow  If discussed at Long Length of Stay Meetings, dates discussed:    Additional Comments:  Dessa Phi, RN 11/30/2016, 11:46 AM

## 2016-11-30 NOTE — Evaluation (Signed)
Physical Therapy Evaluation Patient Details Name: [name] MRN: [id number] DOB: 1941-03-15 Today's Date: 11/30/2016   History of Present Illness  s/p L TKA, h/o R TKA '16; peripheral neuropathy, DM, anxiety  Clinical Impression  The patient ambulated x 545' today. Plans Dc with Virtual therapy. Pt admitted with above diagnosis. Pt currently with functional limitations due to the deficits listed below (see PT Problem List).  Pt will benefit from skilled PT to increase their independence and safety with mobility to allow discharge to the venue listed below.       Follow Up Recommendations  (Virtual therapy)    Equipment Recommendations  None recommended by PT    Recommendations for Other Services       Precautions / Restrictions Precautions Precautions: Knee;Fall Required Braces or Orthoses: Knee Immobilizer - Left Knee Immobilizer - Left: Discontinue once straight leg raise with < 10 degree lag Restrictions Weight Bearing Restrictions: No      Mobility  Bed Mobility Overal bed mobility: Needs Assistance Bed Mobility: Supine to Sit     Supine to sit: Min assist;HOB elevated     General bed mobility comments: up with OT  Transfers Overall transfer level: Needs assistance Equipment used: Rolling walker (2 wheeled) Transfers: Sit to/from Stand Sit to Stand: Min assist;From elevated surface         General transfer comment: cues for UE/LE placement.  Assist to rise and stabilize  Ambulation/Gait Ambulation/Gait assistance: Min assist Ambulation Distance (Feet): 45 Feet Assistive device: Rolling walker (2 wheeled) Gait Pattern/deviations: Step-to pattern        Stairs            Wheelchair Mobility    Modified Rankin (Stroke Patients Only)       Balance                                             Pertinent Vitals/Pain Pain Assessment: 0-10 Pain Score: 6  Pain Location: L knee  Pain Descriptors / Indicators:  Aching Pain Intervention(s): Monitored during session;Premedicated before session;Repositioned;Ice applied    Home Living Family/patient expects to be discharged to:: Private residence Living Arrangements: Spouse/significant other Available Help at Discharge: Family           Home Equipment: Tub bench;Walker - 2 wheels;Bedside commode      Prior Function Level of Independence: Independent with assistive device(s)               Hand Dominance        Extremity/Trunk Assessment   Upper Extremity Assessment Upper Extremity Assessment: Defer to OT evaluation    Lower Extremity Assessment Lower Extremity Assessment: LLE deficits/detail LLE Deficits / Details: 10-50, noted Charcot like foot deformity    Cervical / Trunk Assessment Cervical / Trunk Assessment: Normal  Communication   Communication: No difficulties  Cognition Arousal/Alertness: Awake/alert Behavior During Therapy: WFL for tasks assessed/performed Overall Cognitive Status: Within Functional Limits for tasks assessed                                        General Comments      Exercises Total Joint Exercises Ankle Circles/Pumps: AROM;Both;10 reps Quad Sets: AROM;Both;10 reps Towel Squeeze: AROM;Left;10 reps Short Arc Quad: AROM;Left;10 reps Heel Slides: AAROM;Left;10 reps Hip ABduction/ADduction: AROM;Left;10 reps  Straight Leg Raises: AAROM;Left;10 reps   Assessment/Plan    PT Assessment Patient needs continued PT services  PT Problem List Decreased strength;Decreased range of motion;Decreased activity tolerance;Decreased mobility;Decreased knowledge of precautions;Decreased safety awareness;Decreased knowledge of use of DME;Pain       PT Treatment Interventions DME instruction;Gait training;Stair training;Functional mobility training;Patient/family education;Therapeutic activities;Therapeutic exercise    PT Goals (Current goals can be found in the Care Plan section)  Acute  Rehab PT Goals Patient Stated Goal: home tomorrow PT Goal Formulation: With patient/family Time For Goal Achievement: 12/03/16 Potential to Achieve Goals: Good    Frequency 7X/week   Barriers to discharge        Co-evaluation               AM-PAC PT "6 Clicks" Daily Activity  Outcome Measure Difficulty turning over in bed (including adjusting bedclothes, sheets and blankets)?: Total Difficulty moving from lying on back to sitting on the side of the bed? : Total Difficulty sitting down on and standing up from a chair with arms (e.g., wheelchair, bedside commode, etc,.)?: Total Help needed moving to and from a bed to chair (including a wheelchair)?: A Lot Help needed walking in hospital room?: A Lot Help needed climbing 3-5 steps with a railing? : A Lot 6 Click Score: 9    End of Session Equipment Utilized During Treatment: Left knee immobilizer Activity Tolerance: Patient tolerated treatment well Patient left: in chair;with call bell/phone within reach;with family/visitor present Nurse Communication: Mobility status PT Visit Diagnosis: Difficulty in walking, not elsewhere classified (R26.2);Pain Pain - Right/Left: Left Pain - part of body: Knee    Time: 1201-1238 PT Time Calculation (min) (ACUTE ONLY): 37 min   Charges:   PT Evaluation $PT Eval Low Complexity: 1 Procedure PT Treatments $Gait Training: 8-22 mins $Therapeutic Exercise: 8-22 mins   PT G CodesTresa Endo PT [phone]   Claretha Cooper 11/30/2016, 1:30 PM

## 2016-12-01 ENCOUNTER — Encounter (HOSPITAL_COMMUNITY): Payer: Self-pay | Admitting: Orthopedic Surgery

## 2016-12-01 LAB — BASIC METABOLIC PANEL
ANION GAP: 6 (ref 5–15)
BUN: 14 mg/dL (ref 6–20)
CALCIUM: 8.1 mg/dL — AB (ref 8.9–10.3)
CO2: 30 mmol/L (ref 22–32)
Chloride: 101 mmol/L (ref 101–111)
Creatinine, Ser: 0.7 mg/dL (ref 0.44–1.00)
GLUCOSE: 155 mg/dL — AB (ref 65–99)
POTASSIUM: 4.3 mmol/L (ref 3.5–5.1)
Sodium: 137 mmol/L (ref 135–145)

## 2016-12-01 LAB — GLUCOSE, CAPILLARY
GLUCOSE-CAPILLARY: 279 mg/dL — AB (ref 65–99)
GLUCOSE-CAPILLARY: 152 mg/dL — AB (ref 65–99)
Glucose-Capillary: 154 mg/dL — ABNORMAL HIGH (ref 65–99)
Glucose-Capillary: 120 mg/dL — ABNORMAL HIGH (ref 65–99)
Glucose-Capillary: 155 mg/dL — ABNORMAL HIGH (ref 65–99)

## 2016-12-01 LAB — CBC
HEMATOCRIT: 30.2 % — AB (ref 36.0–46.0)
Hemoglobin: 9.8 g/dL — ABNORMAL LOW (ref 12.0–15.0)
MCH: 29.2 pg (ref 26.0–34.0)
MCHC: 32.5 g/dL (ref 30.0–36.0)
MCV: 89.9 fL (ref 78.0–100.0)
PLATELETS: 182 10*3/uL (ref 150–400)
RBC: 3.36 MIL/uL — AB (ref 3.87–5.11)
RDW: 14 % (ref 11.5–15.5)
WBC: 10.6 10*3/uL — AB (ref 4.0–10.5)

## 2016-12-01 NOTE — Progress Notes (Signed)
OT Cancellation Note  Patient Details Name: [name] MRN: [id number] DOB: 12/13/1940   Cancelled Treatment:    Reason Eval/Treat Not Completed: Other (comment).  Pt states she feels comfortable with using reacher for adls and she has been to the bathroom several times and feels comfortable with this. Will sign off.  Amina Menchaca 12/01/2016, 1:59 PM  Lesle Chris, OTR/L [phone] 12/01/2016

## 2016-12-01 NOTE — Progress Notes (Signed)
OT Cancellation Note  Patient Details Name: [name] MRN: [id number] DOB: 1941-01-14   Cancelled Treatment:    Reason Eval/Treat Not Completed: Pain limiting ability to participate. Will check back this pm.    Eulalah Rupert 12/01/2016, 10:04 AM  Lesle Chris, OTR/L [phone] 12/01/2016

## 2016-12-01 NOTE — Progress Notes (Signed)
Physical Therapy Treatment Patient Details Name: [name] MRN: [id number] DOB: [birth year] Today's Date: 12/01/2016    History of Present Illness s/p L TKA, h/o R TKA '16; peripheral neuropathy, DM, anxiety    PT Comments    POD #2 am session Applied KI and instructed on use for long walks and to wear for increased stability with one step she has to enter her home.  Assisted with ambulation then returned to room to perform TKR TE's followed by ICE.  Pt progressing slowly and not yet ready for a safe D/C.  Will need another PT session today to address one step she has to enter her home.   Follow Up Recommendations  DC plan and follow up therapy as arranged by surgeon (VERA + OP)     Equipment Recommendations  None recommended by PT    Recommendations for Other Services       Precautions / Restrictions Precautions Precautions: Knee;Fall Precaution Comments: instructed on KI use for one step Required Braces or Orthoses: Knee Immobilizer - Left Restrictions Weight Bearing Restrictions: No Other Position/Activity Restrictions: WBAT    Mobility  Bed Mobility               General bed mobility comments: OOB in recliner  Transfers Overall transfer level: Needs assistance Equipment used: Rolling walker (2 wheeled) Transfers: Sit to/from Stand Sit to Stand: Max assist;+2 physical assistance;+2 safety/equipment         General transfer comment: much difficulty from recliner, required 2 assist to stand. did better from [facility] height.   Ambulation/Gait Ambulation/Gait assistance: Min guard;Supervision Ambulation Distance (Feet): 45 Feet   Gait Pattern/deviations: Step-to pattern;Step-through pattern Gait velocity: decreased   General Gait Details: cues for sequence, safety, tends to hit right foot as it turns outward.     required increased time   Financial trader Rankin (Stroke Patients Only)       Balance                                             Cognition Arousal/Alertness: Awake/alert Behavior During Therapy: WFL for tasks assessed/performed Overall Cognitive Status: Within Functional Limits for tasks assessed                                        Exercises   Total Knee Replacement TE's 10 reps B LE ankle pumps 10 reps towel squeezes 10 reps knee presses 10 reps heel slides  10 reps SAQ's 10 reps SLR's 10 reps ABD Followed by ICE     General Comments        Pertinent Vitals/Pain Pain Assessment: 0-10 Pain Score: 5  Pain Location: L knee Pain Descriptors / Indicators: Aching;Sore;Operative site guarding Pain Intervention(s): Monitored during session;Repositioned;Premedicated before session;Ice applied    Home Living                      Prior Function            PT Goals (current goals can now be found in the care plan section) Progress towards PT goals: Progressing toward goals    Frequency    7X/week      PT Plan  Current plan remains appropriate    Co-evaluation              AM-PAC PT "6 Clicks" Daily Activity  Outcome Measure  Difficulty turning over in bed (including adjusting bedclothes, sheets and blankets)?: Total Difficulty moving from lying on back to sitting on the side of the bed? : Total Difficulty sitting down on and standing up from a chair with arms (e.g., wheelchair, bedside commode, etc,.)?: Total Help needed moving to and from a bed to chair (including a wheelchair)?: Total Help needed walking in hospital room?: Total Help needed climbing 3-5 steps with a railing? : Total 6 Click Score: 6    End of Session Equipment Utilized During Treatment: Left knee immobilizer Activity Tolerance: Patient limited by fatigue Patient left: in chair;with call bell/phone within reach;with family/visitor present Nurse Communication: Mobility status PT Visit Diagnosis: Difficulty in walking, not elsewhere  classified (R26.2);Pain Pain - Right/Left: Left Pain - part of body: Knee     Time: 1142-1207 PT Time Calculation (min) (ACUTE ONLY): 25 min  Charges:  $Gait Training: 8-22 mins $Therapeutic Exercise: 8-22 mins                    G Codes:       Rica Koyanagi  PTA [facility]  Acute  Rehab Pager      [phone]

## 2016-12-01 NOTE — Progress Notes (Signed)
Physical Therapy Treatment Patient Details Name: [name] MRN: [id number] DOB: 1940/10/05 Today's Date: 12/01/2016    History of Present Illness s/p L TKA, h/o R TKA '16; peripheral neuropathy, DM, anxiety    PT Comments    POD # 2 Pt was unable to rise out of recliner + one therapist so with NT assisted from recliner to [facility].  During sit to stand pt present x 2 posterior LOB therapist recovered.  amb a decreased distance due to fatigue.  Attempted to practice one step pt has to enter her home however unable to push her self upward despite + 2 assist.  Returned to room and applied ICE. Pt has not met goals to safely D/C to home today.  Spouse does not feel comfortable with current mobility level.   Follow Up Recommendations  DC plan and follow up therapy as arranged by surgeon (VERA + OP)     Equipment Recommendations  None recommended by PT    Recommendations for Other Services       Precautions / Restrictions Precautions Precautions: Knee;Fall Precaution Comments: instructed on KI use for one step Required Braces or Orthoses: Knee Immobilizer - Left Restrictions Weight Bearing Restrictions: No Other Position/Activity Restrictions: WBAT    Mobility  Bed Mobility               General bed mobility comments: OOB in recliner  Transfers Overall transfer level: Needs assistance Equipment used: Rolling walker (2 wheeled) Transfers: Sit to/from Stand Sit to Stand: Max assist;+2 physical assistance;+2 safety/equipment         General transfer comment: posterior LOB x 2 getting up from elevated BSC.  Therapist recovered.  Near Fall.    Ambulation/Gait Ambulation/Gait assistance: Min guard;Supervision Ambulation Distance (Feet): 14 Feet   Gait Pattern/deviations: Step-to pattern;Step-through pattern Gait velocity: decreased   General Gait Details: decreased amb distance due to increased c/o fatigue and weakness.     Stairs Stairs:  (attempted one step  however unable to complete full step up despite + 2 assist)          Wheelchair Mobility    Modified Rankin (Stroke Patients Only)       Balance                                            Cognition Arousal/Alertness: Awake/alert Behavior During Therapy: WFL for tasks assessed/performed Overall Cognitive Status: Within Functional Limits for tasks assessed                                        Exercises      General Comments        Pertinent Vitals/Pain Pain Assessment: 0-10 Pain Score: 5  Pain Location: L knee Pain Descriptors / Indicators: Aching;Sore;Operative site guarding Pain Intervention(s): Monitored during session;Repositioned;Premedicated before session;Ice applied    Home Living                      Prior Function            PT Goals (current goals can now be found in the care plan section) Progress towards PT goals: Progressing toward goals    Frequency    7X/week      PT Plan Current plan remains appropriate  Co-evaluation              AM-PAC PT "6 Clicks" Daily Activity  Outcome Measure  Difficulty turning over in bed (including adjusting bedclothes, sheets and blankets)?: Total Difficulty moving from lying on back to sitting on the side of the bed? : Total Difficulty sitting down on and standing up from a chair with arms (e.g., wheelchair, bedside commode, etc,.)?: Total Help needed moving to and from a bed to chair (including a wheelchair)?: Total Help needed walking in hospital room?: Total Help needed climbing 3-5 steps with a railing? : Total 6 Click Score: 6    End of Session Equipment Utilized During Treatment: Left knee immobilizer Activity Tolerance: Patient limited by fatigue Patient left: in chair;with call bell/phone within reach;with family/visitor present Nurse Communication: Mobility status PT Visit Diagnosis: Difficulty in walking, not elsewhere classified  (R26.2);Pain Pain - Right/Left: Left Pain - part of body: Knee     Time: 1412-1440 PT Time Calculation (min) (ACUTE ONLY): 28 min  Charges:  $Gait Training: 8-22 mins $Therapeutic Activity: 8-22 mins                    G Codes:       Rica Koyanagi  PTA [facility]  Acute  Rehab Pager      [phone]

## 2016-12-01 NOTE — Progress Notes (Signed)
Subjective: 2 Days Post-Op Procedure(s) (LRB): LEFT TOTAL KNEE ARTHROPLASTY (Left) Patient reports pain as moderate and severe early this morning requiring IV pain medication  Patient seen in rounds for Dr. Wynelle Link. Patient is well, but has had some minor complaints of pain in the knee, requiring pain medications Plan is to go Home after hospital stay.  Objective: Vital signs in last 24 hours: Temp:  [98.1 F (36.7 C)] 98.1 F (36.7 C) (07/24 2038) Pulse Rate:  [78-99] 99 (07/25 0840) Resp:  [18] 18 (07/24 2038) BP: (118-140)/(48-59) 140/59 (07/25 0840) SpO2:  [94 %] 94 % (07/24 2038)  Intake/Output from previous day:  Intake/Output Summary (Last 24 hours) at 12/01/16 1547 Last data filed at 12/01/16 0847  Gross per 24 hour  Intake             1050 ml  Output              825 ml  Net              225 ml    Intake/Output this shift: Total I/O In: 240 [P.O.:240] Out: 200 [Urine:200]  Labs:  Recent Labs  11/30/16 0517 12/01/16 0529  HGB 10.4* 9.8*    Recent Labs  11/30/16 0517 12/01/16 0529  WBC 11.8* 10.6*  RBC 3.54* 3.36*  HCT 32.2* 30.2*  PLT 196 182    Recent Labs  11/30/16 0517 12/01/16 0529  NA 136 137  K 4.5 4.3  CL 103 101  CO2 27 30  BUN 15 14  CREATININE 0.67 0.70  GLUCOSE 206* 155*  CALCIUM 8.2* 8.1*   No results for input(s): LABPT, INR in the last 72 hours.  EXAM General - Patient is Alert, Appropriate and Oriented Extremity - Neurovascular intact Sensation intact distally Intact pulses distally Dorsiflexion/Plantar flexion intact Dressing/Incision - clean, dry, no drainage Motor Function - intact, moving foot and toes well on exam.   Past Medical History:  Diagnosis Date  . Arthritis    right knee;injections every 18months   . Asthma   . Bronchiectasis ([place])   . Cancer (HCC)    HX BREAST CANCER  . COMPRESSION FRACTURE, LUMBAR VERTEBRAE 08/21/2008   Qualifier: Diagnosis of  By: Arnoldo Morale MD, Balinda Quails   . Depression   .  Diabetes mellitus    takes Amaryl and Januvia daily  . Difficulty sleeping   . Diverticulitis   . Early cataracts, bilateral   . Eczema   . Endometriosis   . Gastritis   . Glaucoma   . Hammer toe   . Headache(784.0)    Migraines  . Hypertension    takes Atenolol daily  . IBS (irritable bowel syndrome)   . Joint pain   . Kidney stone   . Neuropathy   . Open wound of second toe of left foot    at pre-op appt, wound appears clear of infection 1 month post removal of toenail, no obvious exudate present nor any redness,  . Osteomyelitis ([place])    left 2nd toe  . Osteopenia   . PONV (postoperative nausea and vomiting)   . Posterior tibial tendon dysfunction    left foot  . Scoliosis   . Severe esophageal dysplasia   . 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 Vitamin D deficiency    takes Vit d daily    Assessment/Plan: 2 Days Post-Op Procedure(s) (LRB): LEFT TOTAL KNEE ARTHROPLASTY (Left) Principal Problem:   OA (osteoarthritis)  of knee  Estimated body mass index is 34.86 kg/m as calculated from the following:   Height as of this encounter: 6' (1.829 m).   Weight as of this encounter: 116.6 kg (257 lb). Up with therapy Plan for discharge tomorrow Plan for In-Home VERA Therapy  DVT Prophylaxis - Xarelto Weight-Bearing as tolerated to left leg  Arlee Muslim, PA-C Orthopaedic Surgery 12/01/2016, 3:47 PM

## 2016-12-02 LAB — CBC
HEMATOCRIT: 30.6 % — AB (ref 36.0–46.0)
Hemoglobin: 9.9 g/dL — ABNORMAL LOW (ref 12.0–15.0)
MCH: 29.7 pg (ref 26.0–34.0)
MCHC: 32.4 g/dL (ref 30.0–36.0)
MCV: 91.9 fL (ref 78.0–100.0)
Platelets: 207 10*3/uL (ref 150–400)
RBC: 3.33 MIL/uL — ABNORMAL LOW (ref 3.87–5.11)
RDW: 14.2 % (ref 11.5–15.5)
WBC: 10 10*3/uL (ref 4.0–10.5)

## 2016-12-02 LAB — GLUCOSE, CAPILLARY
Glucose-Capillary: 170 mg/dL — ABNORMAL HIGH (ref 65–99)
Glucose-Capillary: 198 mg/dL — ABNORMAL HIGH (ref 65–99)

## 2016-12-02 NOTE — Progress Notes (Signed)
Subjective: 3 Days Post-Op Procedure(s) (LRB): LEFT TOTAL KNEE ARTHROPLASTY (Left) Patient reports pain as mild.   Patient seen in rounds with Dr. Wynelle Link.  Doing better today. Patient is well, but has had some minor complaints of pain in the knee, requiring pain medications Patient is ready to go home  Objective: Vital signs in last 24 hours: Temp:  [98.9 F (37.2 C)-99.4 F (37.4 C)] 99.4 F (37.4 C) (07/26 0538) Pulse Rate:  [99-108] 108 (07/26 0538) Resp:  [18] 18 (07/26 0538) BP: (133-151)/(55-62) 151/62 (07/26 0538) SpO2:  [94 %-95 %] 94 % (07/26 0538)  Intake/Output from previous day:  Intake/Output Summary (Last 24 hours) at 12/02/16 0829 Last data filed at 12/02/16 0538  Gross per 24 hour  Intake              600 ml  Output             1050 ml  Net             -450 ml    Intake/Output this shift: No intake/output data recorded.  Labs:  Recent Labs  11/30/16 0517 12/01/16 0529 12/02/16 0554  HGB 10.4* 9.8* 9.9*    Recent Labs  12/01/16 0529 12/02/16 0554  WBC 10.6* 10.0  RBC 3.36* 3.33*  HCT 30.2* 30.6*  PLT 182 207    Recent Labs  11/30/16 0517 12/01/16 0529  NA 136 137  K 4.5 4.3  CL 103 101  CO2 27 30  BUN 15 14  CREATININE 0.67 0.70  GLUCOSE 206* 155*  CALCIUM 8.2* 8.1*   No results for input(s): LABPT, INR in the last 72 hours.  EXAM: General - Patient is Alert, Appropriate and Oriented Extremity - Neurovascular intact Sensation intact distally Intact pulses distally Dorsiflexion/Plantar flexion intact Incision - clean, dry, no drainage Motor Function - intact, moving foot and toes well on exam.   Assessment/Plan: 3 Days Post-Op Procedure(s) (LRB): LEFT TOTAL KNEE ARTHROPLASTY (Left) Procedure(s) (LRB): LEFT TOTAL KNEE ARTHROPLASTY (Left) Past Medical History:  Diagnosis Date  . Arthritis    right knee;injections every 20months   . Asthma   . Bronchiectasis ([place])   . Cancer (HCC)    HX BREAST CANCER  . COMPRESSION  FRACTURE, LUMBAR VERTEBRAE 08/21/2008   Qualifier: Diagnosis of  By: Arnoldo Morale MD, Balinda Quails   . Depression   . Diabetes mellitus    takes Amaryl and Januvia daily  . Difficulty sleeping   . Diverticulitis   . Early cataracts, bilateral   . Eczema   . Endometriosis   . Gastritis   . Glaucoma   . Hammer toe   . Headache(784.0)    Migraines  . Hypertension    takes Atenolol daily  . IBS (irritable bowel syndrome)   . Joint pain   . Kidney stone   . Neuropathy   . Open wound of second toe of left foot    at pre-op appt, wound appears clear of infection 1 month post removal of toenail, no obvious exudate present nor any redness,  . Osteomyelitis ([place])    left 2nd toe  . Osteopenia   . PONV (postoperative nausea and vomiting)   . Posterior tibial tendon dysfunction    left foot  . Scoliosis   . Severe esophageal dysplasia   . 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 Vitamin D deficiency    takes Vit d daily   Principal Problem:  OA (osteoarthritis) of knee  Estimated body mass index is 34.86 kg/m as calculated from the following:   Height as of this encounter: 6' (1.829 m).   Weight as of this encounter: 116.6 kg (257 lb). Up with therapy Discharge home - Plan for In-Home [facility]A Therapy Diet - Cardiac diet and Diabetic diet Follow up - in 2 weeks Activity - WBAT Disposition - Home Condition Upon Discharge - Good D/C Meds - See DC Summary DVT Prophylaxis - Xarelto  Arlee Muslim, PA-C Orthopaedic Surgery 12/02/2016, 8:29 AM

## 2016-12-02 NOTE — Progress Notes (Signed)
Physical Therapy Treatment Patient Details Name: [name] MRN: [id number] DOB: Jun 20, 1940 Today's Date: 12/02/2016    History of Present Illness s/p L TKA, h/o R TKA '16; peripheral neuropathy, DM, anxiety    PT Comments    POD # 3 Pt feeling better and eager to go home and rest.  With spouse present, assisted with amb in hallway and practiced one step backward "hands on" with spouse.  Pt performed but would be best she have + 2 assist.  Spouse stated he can get their son to help get pt into house today.  Pt also given HEP secondary to VERA.     Follow Up Recommendations  DC plan and follow up therapy as arranged by surgeon     Equipment Recommendations  None recommended by PT    Recommendations for Other Services       Precautions / Restrictions Precautions Precautions: Knee;Fall Precaution Comments: instructed on KI use for one step Required Braces or Orthoses: Knee Immobilizer - Left Knee Immobilizer - Left: Discontinue once straight leg raise with < 10 degree lag Restrictions Weight Bearing Restrictions: No Other Position/Activity Restrictions: WBAT    Mobility  Bed Mobility               General bed mobility comments: OOB in recliner  Transfers Overall transfer level: Needs assistance Equipment used: Rolling walker (2 wheeled) Transfers: Sit to/from Stand Sit to Stand: Min assist         General transfer comment: increased ability to self rise  Ambulation/Gait Ambulation/Gait assistance: Supervision;Min guard Ambulation Distance (Feet): 16 Feet Assistive device: Rolling walker (2 wheeled) Gait Pattern/deviations: Step-to pattern;Step-through pattern Gait velocity: decreased   General Gait Details: amb with spouse "hands on" 25% VC's to increase upright posture   Stairs Stairs: Yes   Stair Management: No rails;Step to pattern;Backwards;With walker Number of Stairs: 1 General stair comments: with spouse "hands on" up backward one step  with much effort and 75% VC's on proper tech and safety.  Advised spouse to have 2 assist to safely get pt into home today.  Spouse stated he can get his son to help.    Wheelchair Mobility    Modified Rankin (Stroke Patients Only)       Balance                                            Cognition Arousal/Alertness: Awake/alert Behavior During Therapy: WFL for tasks assessed/performed Overall Cognitive Status: Within Functional Limits for tasks assessed                                        Exercises      General Comments        Pertinent Vitals/Pain Pain Assessment: 0-10 Pain Score: 3  Pain Location: L knee Pain Descriptors / Indicators: Aching;Sore;Operative site guarding Pain Intervention(s): Monitored during session;Repositioned;Ice applied;Premedicated before session    Home Living                      Prior Function            PT Goals (current goals can now be found in the care plan section) Progress towards PT goals: Progressing toward goals    Frequency  PT Plan Current plan remains appropriate    Co-evaluation              AM-PAC PT "6 Clicks" Daily Activity  Outcome Measure  Difficulty turning over in bed (including adjusting bedclothes, sheets and blankets)?: Total Difficulty moving from lying on back to sitting on the side of the bed? : Total Difficulty sitting down on and standing up from a chair with arms (e.g., wheelchair, bedside commode, etc,.)?: Total Help needed moving to and from a bed to chair (including a wheelchair)?: Total Help needed walking in hospital room?: Total Help needed climbing 3-5 steps with a railing? : Total 6 Click Score: 6    End of Session Equipment Utilized During Treatment: Left knee immobilizer Activity Tolerance: Patient tolerated treatment well Patient left: in chair;with call bell/phone within reach Nurse Communication:  (pt ready for D/C to  home) PT Visit Diagnosis: Difficulty in walking, not elsewhere classified (R26.2);Pain     Time: 2820-6015 PT Time Calculation (min) (ACUTE ONLY): 25 min  Charges:  $Gait Training: 8-22 mins $Therapeutic Activity: 8-22 mins                    G Codes:      Rica Koyanagi  PTA [facility]  Acute  Rehab Pager      [phone]

## 2016-12-05 ENCOUNTER — Emergency Department (HOSPITAL_COMMUNITY): Payer: Medicare Other

## 2016-12-05 ENCOUNTER — Emergency Department (HOSPITAL_COMMUNITY)
Admission: EM | Admit: 2016-12-05 | Discharge: 2016-12-05 | Disposition: A | Discharge status: Home / Self Care | Payer: Medicare Other | Attending: Emergency Medicine | Admitting: Emergency Medicine

## 2016-12-05 ENCOUNTER — Encounter (HOSPITAL_COMMUNITY): Payer: Self-pay | Admitting: Emergency Medicine

## 2016-12-05 ENCOUNTER — Emergency Department (HOSPITAL_BASED_OUTPATIENT_CLINIC_OR_DEPARTMENT_OTHER)
Admission: EM | Admit: 2016-12-05 | Discharge: 2016-12-05 | Disposition: A | Discharge status: Home / Self Care | Payer: Medicare Other | Source: Home / Self Care

## 2016-12-05 DIAGNOSIS — Z853 Personal history of malignant neoplasm of breast: Secondary | ICD-10-CM | POA: Insufficient documentation

## 2016-12-05 DIAGNOSIS — G8918 Other acute postprocedural pain: Secondary | ICD-10-CM | POA: Diagnosis not present

## 2016-12-05 DIAGNOSIS — J45909 Unspecified asthma, uncomplicated: Secondary | ICD-10-CM | POA: Diagnosis not present

## 2016-12-05 DIAGNOSIS — Z79899 Other long term (current) drug therapy: Secondary | ICD-10-CM | POA: Diagnosis not present

## 2016-12-05 DIAGNOSIS — I10 Essential (primary) hypertension: Secondary | ICD-10-CM | POA: Insufficient documentation

## 2016-12-05 DIAGNOSIS — Z96652 Presence of left artificial knee joint: Secondary | ICD-10-CM | POA: Insufficient documentation

## 2016-12-05 DIAGNOSIS — E119 Type 2 diabetes mellitus without complications: Secondary | ICD-10-CM | POA: Diagnosis not present

## 2016-12-05 DIAGNOSIS — M79609 Pain in unspecified limb: Secondary | ICD-10-CM

## 2016-12-05 DIAGNOSIS — M7989 Other specified soft tissue disorders: Secondary | ICD-10-CM | POA: Diagnosis not present

## 2016-12-05 DIAGNOSIS — Z7984 Long term (current) use of oral hypoglycemic drugs: Secondary | ICD-10-CM | POA: Insufficient documentation

## 2016-12-05 DIAGNOSIS — Z9889 Other specified postprocedural states: Secondary | ICD-10-CM

## 2016-12-05 DIAGNOSIS — Z471 Aftercare following joint replacement surgery: Secondary | ICD-10-CM | POA: Diagnosis not present

## 2016-12-05 DIAGNOSIS — M25562 Pain in left knee: Secondary | ICD-10-CM

## 2016-12-05 MED ORDER — HYDROCODONE-ACETAMINOPHEN 5-325 MG PO TABS
2.0000 | ORAL_TABLET | Freq: Once | ORAL | Status: AC
  Administered 2016-12-05: 2 via ORAL
  Filled 2016-12-05: qty 2

## 2016-12-05 MED ORDER — DOXYCYCLINE HYCLATE 100 MG PO CAPS: 100.0000 mg | ORAL_CAPSULE | Freq: Two times a day (BID) | ORAL | 0 refills | Status: DC

## 2016-12-05 NOTE — Progress Notes (Addendum)
VASCULAR LAB PRELIMINARY  PRELIMINARY  PRELIMINARY  PRELIMINARY  Left lower extremity venous duplex completed.    Preliminary report:  Left:  No evidence of DVTor superficial thrombosis. There appears to be a fluid collection in the popliteal fossa which may be a known Baker's cyst.  Aleksandar Duve, RVS 12/05/2016, 1:34 PM

## 2016-12-05 NOTE — ED Notes (Signed)
Pt did sign out after discharge instructions; signature pad not working properly

## 2016-12-05 NOTE — ED Triage Notes (Signed)
Pt with L knee surgery, TKR, one week ago and patient d/c from hospital 4 days ago. Pt fell onto R knee getting into home. Pt states knee has become painful, red and pain to posterior knee.

## 2016-12-05 NOTE — ED Provider Notes (Signed)
[facility] DEPT Provider Note   CSN: [id number] Arrival date & time: 12/05/16  1151  By signing my name below, I, [name], attest that this documentation has been prepared under the direction and in the presence of Olivia Manifold, Hahn. Electronically Signed: Reola Hahn, ED Scribe. 12/05/16. 12:41 PM.  History   Chief Complaint Chief Complaint  Patient presents with  . Knee Pain    L knee   The history is provided by the patient. No language interpreter was used.   HPI Comments: [name] is a 76 y.o. female who is ~1 week s/p TKR of the left knee (performed by Dr [name]), with a PMHx of arthritis, breast cancer, DM, HTN, CKD stage II, who presents to the Emergency Department complaining of persistent, worsening left knee swelling, pain, and redness beginning three days ago. Pt reports that one week ago she had a TKR to the left knee. She states that three days ago immediately following being discharged from the hospital that she fell directly onto the knee and since then she has had swelling, pain, and redness to the left knee. She reports that her swelling extends into her left ankle. She has been minimally ambulatory and WBAT since her surgery. Pt is currently on Xarelto everyday which she was placed on prophylactically. She denies fever, chills, or any other associated symptoms.   Past Medical History:  Diagnosis Date  . Arthritis    right knee;injections every 71months   . Asthma   . Bronchiectasis ([place])   . Cancer (HCC)    HX BREAST CANCER  . COMPRESSION FRACTURE, LUMBAR VERTEBRAE 08/21/2008   Qualifier: Diagnosis of  By: [name], [name]   . Depression   . Diabetes mellitus    takes Amaryl and Januvia daily  . Difficulty sleeping   . Diverticulitis   . Early cataracts, bilateral   . Eczema   . Endometriosis   . Gastritis   . Glaucoma   . Hammer toe   . Headache(784.0)    Migraines  . Hypertension    takes Atenolol daily  . IBS  (irritable bowel syndrome)   . Joint pain   . Kidney stone   . Neuropathy   . Open wound of second toe of left foot    at pre-op appt, wound appears clear of infection 1 month post removal of toenail, no obvious exudate present nor any redness,  . Osteomyelitis ([place])    left 2nd toe  . Osteopenia   . PONV (postoperative nausea and vomiting)   . Posterior tibial tendon dysfunction    left foot  . Scoliosis   . Severe esophageal dysplasia   . 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 Vitamin D deficiency    takes Vit d daily   Patient Active Problem List   Diagnosis Date Noted  . Arthritis of midfoot 03/03/2016  . Hammertoes of both feet 03/03/2016  . Aortic atherosclerosis ([place]) 02/16/2016  . Solitary pulmonary nodule 01/08/2016  . 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  . Bronchiectasis without acute exacerbation ([place]) 10/18/2015  . Acute diverticulitis 10/05/2015  . OA (osteoarthritis) of knee 12/30/2014  . IBS (irritable bowel syndrome) 07/17/2014  . CKD (chronic kidney disease), stage II 01/22/2014  . Depression 01/03/2014  . Posterior tibial tendon dysfunction 01/03/2014  . Osteoarthritis, knee 01/03/2014  . Glaucoma 01/03/2014  . Essential hypertension, benign 01/03/2014  . Obesity (BMI 30-39.9) 06/15/2013  . Foot tendinitis 07/20/2010  .  INSOMNIA, CHRONIC 07/25/2009  . Fatty liver 03/18/2009  . GERD 12/25/2007  . Hyperlipidemia 07/26/2007  . ANEMIA, B12 DEFICIENCY 12/29/2006  . Diabetes mellitus type II, controlled ([name]) 12/28/2006  . RESTLESS LEG SYNDROME, MILD 12/28/2006  . NEUROPATHY, IDIOPATHIC PERIPHERAL NEC 12/28/2006  . OSTEOPOROSIS 12/12/2006  . BREAST CANCER, HX OF 12/12/2006   Past Surgical History:  Procedure Laterality Date  . ADENOIDECTOMY     at age 46  . biopsy on back     benign  . CATARACT EXTRACTION    . CHOLECYSTECTOMY  06/2008  . COLONOSCOPY  11/02/05  . DILATION AND CURETTAGE OF UTERUS    . ENDOMETRIAL BIOPSY   06/22/2011   benign polyp, no hyperplasia  . excision on breast     internal infected suture from breast surgery  . excision removed from neck     infected lymph node  . FOOT SURGERY     left foot-cleaning out areas on toes at the wound center-having MRI to determine if osteomyelitis  . HYSTEROSCOPY  06/22/11   PMB submucosal myoma  . JOINT REPLACEMENT     left total shoulder  . LAPAROSCOPIC OOPHORECTOMY Right 12/2004   absent LSO  . LAPAROTOMY    . MASTECTOMY Bilateral 1994 right, 1995 left  . SHOULDER HEMI-ARTHROPLASTY  01/01/2012   Procedure: SHOULDER HEMI-ARTHROPLASTY;  Surgeon: Olivia Kaufman, Hahn;  Location: [facility];  Service: Orthopedics;  Laterality: Left;  Left Shoulder Hemi Arthroplasty with Repair and Reconstruction as Necessary   . THYROIDECTOMY     74yrs ago. follows endocrine  . TONSILLECTOMY    . TOTAL KNEE ARTHROPLASTY Right 12/30/2014   Procedure: RIGHT TOTAL KNEE ARTHROPLASTY;  Surgeon: Olivia Arabian, Hahn;  Location: [facility];  Service: Orthopedics;  Laterality: Right;  . TOTAL KNEE ARTHROPLASTY Left 11/29/2016   Procedure: LEFT TOTAL KNEE ARTHROPLASTY;  Surgeon: Olivia Arabian, Hahn;  Location: [facility];  Service: Orthopedics;  Laterality: Left;   OB History    Gravida Para Term Preterm AB Living   0             SAB TAB Ectopic Multiple Live Births                  Obstetric Comments   1 adopted     Home Medications    Prior to Admission medications   Medication Sig Start Date End Date Taking? Authorizing Provider  atenolol (TENORMIN) 25 MG tablet Take 1 tablet (25 mg total) by mouth daily. 02/20/16   Olivia Olp, Hahn  betamethasone dipropionate (DIPROLENE) 0.05 % cream Apply 1 application topically 2 (two) times daily as needed (dermatitis). 01/03/16   Olivia Mau, NP  budesonide-formoterol (SYMBICORT) 160-4.5 MCG/ACT inhaler Inhale 2 puffs into the lungs 2 (two) times daily. 02/26/16 11/22/16  Olivia Doom, Hahn  cetirizine (KLS ALLER-TEC) 10 MG tablet  Take 10 mg by mouth daily.    Provider, Historical, Hahn  dorzolamide-timolol (COSOPT) 22.3-6.8 MG/ML ophthalmic solution Place 1 drop into both eyes 2 (two) times daily.  02/08/13   Provider, Historical, Hahn  FLUoxetine (PROZAC) 40 MG capsule Take 40 mg by mouth daily.    Provider, Historical, Hahn  glimepiride (AMARYL) 4 MG tablet TAKE ONE TABLET BY MOUTH ONCE DAILY WITH BREAKFAST Patient taking differently: Take 4 mg by mouth daily with breakfast. TAKE ONE TABLET BY MOUTH ONCE DAILY WITH BREAKFAST 02/20/16   Olivia Olp, Hahn  glucose blood (FREESTYLE LITE) test strip USE TO CHECK BLOOD SUGAR DAILY AND AS  NEEDED 02/20/16   Olivia Olp, Hahn  HYDROmorphone (DILAUDID) 2 MG tablet Take 1-2 tablets (2-4 mg total) by mouth every 4 (four) hours as needed for severe pain. 11/30/16   Perkins, Alexzandrew L, PA-C  Isometheptene-Caffeine-APAP 65-20-325 MG TABS Take 1-2 tablets as needed for migraine twice a day maximum Patient taking differently: Take 1-2 tablets by mouth daily as needed (migraines). Take 1-2 tablets as needed for migraine twice a day maximum 10/13/15   Olivia Olp, Hahn  lisinopril (PRINIVIL,ZESTRIL) 10 MG tablet Take 1 tablet (10 mg total) by mouth daily. Patient taking differently: Take 10 mg by mouth every evening.  02/20/16   Olivia Olp, Hahn  Loperamide HCl (IMODIUM PO) Take 1 tablet by mouth daily.    Provider, Historical, Hahn  LUMIGAN 0.01 % SOLN Place 1 drop into both eyes at bedtime.  03/26/14   Provider, Historical, Hahn  methocarbamol (ROBAXIN) 500 MG tablet Take 1 tablet (500 mg total) by mouth every 6 (six) hours as needed for muscle spasms. 11/30/16   Perkins, Alexzandrew L, PA-C  Methylcellulose, Laxative, (CITRUCEL) 500 MG TABS Take 2 tablets by mouth at bedtime.     Provider, Historical, Hahn  pramipexole (MIRAPEX) 0.5 MG tablet Take 1 tablet (0.5 mg total) by mouth at bedtime. 05/07/16   Olivia Olp, Hahn  Respiratory Therapy Supplies (FLUTTER) DEVI Use as directed  01/08/16   Olivia Doom, Hahn  rivaroxaban (XARELTO) 10 MG TABS tablet Take 1 tablet (10 mg total) by mouth daily with breakfast. Take Xarelto for two and a half more weeks following discharge from the hospital, then discontinue Xarelto. Once the patient has completed the blood thinner regimen, then take a Baby 81 mg Aspirin daily for three more weeks. 12/01/16   Perkins, Alexzandrew L, PA-C  temazepam (RESTORIL) 30 MG capsule Take 1 capsule (30 mg total) by mouth at bedtime as needed. for sleep Patient taking differently: Take 30 mg by mouth at bedtime. for sleep 07/23/16   Olivia Olp, Hahn  VICTOZA 18 MG/3ML SOPN INJECT 1.8MG  INTO THE SKIN DAILY 11/05/16   Olivia Olp, Hahn   Family History Family History  Problem Relation Age of Onset  . Arthritis Mother   . COPD Father   . Heart disease Father        MI 25  . Hypertension Father   . Hyperlipidemia Father   . Colon cancer Neg Hx   . Esophageal cancer Neg Hx   . Rectal cancer Neg Hx   . Stomach cancer Neg Hx    Social History Social History  Substance Use Topics  . Smoking status: Never Smoker  . Smokeless tobacco: Never Used  . Alcohol use No   Allergies   Cephalexin; Erythromycin ethylsuccinate; Levaquin [levofloxacin in d5w]; Nitrofurantoin; and Percocet [oxycodone-acetaminophen]  Review of Systems Review of Systems  Constitutional: Negative for chills and fever.  Musculoskeletal: Positive for arthralgias, joint swelling and myalgias.  Skin: Positive for color change.  All other systems reviewed and are negative.  Physical Exam Updated Vital Signs BP (!) 138/110 (BP Location: Left Arm)   Pulse 93   Temp 98.2 F (36.8 C) (Oral)   Resp 18   LMP 05/10/1992   SpO2 92%   Physical Exam  Constitutional: She appears well-developed and well-nourished.  HENT:  Head: Normocephalic.  Right Ear: External ear normal.  Left Ear: External ear normal.  Nose: Nose normal.  Mouth/Throat: Oropharynx is clear and  moist.  Eyes: Conjunctivae are  normal. Right eye exhibits no discharge. Left eye exhibits no discharge.  Neck: Normal range of motion.  Cardiovascular: Normal rate, regular rhythm and normal heart sounds.   No murmur heard. Pulmonary/Chest: Effort normal and breath sounds normal. No respiratory distress. She has no wheezes. She has no rales.  Abdominal: Soft. She exhibits no distension. There is no tenderness. There is no rebound and no guarding.  Musculoskeletal: She exhibits edema.  Anterior left knee with surgical incision. Steri strips in place. Small amount of dried blood. Erythema to the anterior knee and extending inferiorly to the the site of the incision. Diffuse swelling into the left ankle. Palpable DP pulse.   Neurological: She is alert. No cranial nerve deficit. Coordination normal.  Skin: Skin is warm and dry. No rash noted. There is erythema. No pallor.  Psychiatric: She has a normal mood and affect. Her behavior is normal.  Nursing note and vitals reviewed.  ED Treatments / Results  DIAGNOSTIC STUDIES: Oxygen Saturation is 92% on RA, low by my interpretation.   COORDINATION OF CARE: 12:33 PM-Discussed next steps with pt. Pt verbalized understanding and is agreeable with the plan.   Labs (all labs ordered are listed, but only abnormal results are displayed) Labs Reviewed - No data to display  EKG  EKG Interpretation None      Radiology No results found.  Procedures Procedures   Medications Ordered in ED Medications - No data to display  Initial Impression / Assessment and Plan / ED Course  I have reviewed the triage vital signs and the nursing notes.  Pertinent labs & imaging results that were available during my care of the patient were reviewed by me and considered in my medical decision making (see chart for details).     L knee pain 1w post-op from L total knee replacement.  She reports increasing pain and swelling since fall just after leaving  hospital. No DVT. XR as above. Timing seems too acute to visualize osseous changes like this from an infectious process? Will discuss with orthopedics in terms of weight bearing status. There is swelling and some erythema which doesn't seem out of expected range for 1w post-op. She says it has been worsening though. If anything, I think possible cellulitis is more plausible than septic joint. Seems more like soft tissue swelling than joint effusion. Can range actively although limited as expected. Afebrile.   Final Clinical Impressions(s) / ED Diagnoses   Final diagnoses:  Acute pain of left knee  Post-operative state   New Prescriptions New Prescriptions   No medications on file   I personally preformed the services scribed in my presence. The recorded information has been reviewed is accurate. Olivia Manifold, Hahn.      Olivia Manifold, Hahn 12/12/16 [phone]

## 2016-12-10 ENCOUNTER — Telehealth: Payer: Self-pay | Admitting: Family Medicine

## 2016-12-10 NOTE — Telephone Encounter (Signed)
Pt just had knee replacement and thinks she has uti. Pt is requesting to drop urine sample

## 2016-12-10 NOTE — Telephone Encounter (Signed)
Spoke with pt and she c/o urinary frequency. She denies pain with urination, burning, abd/flank pain or fever. She is concerned about a UTI and wanted to bring in a sample. Advised pt that we do not generally accept drop-off samples as most providers like to be able to assess the pt and their complaints. She states that she had TKA about one week ago and still cannot leave the house. Advised pt that she could schedule with the Saturday clinic or monitor her symptoms and if develops pain/burning/fever can call office to schedule visit. Also advised pt that she can increase hydration, add cranberry juice and OTC Azo if she has increased symptoms and until she can come in for OV. Pt voiced understanding. Nothing further needed at this time.

## 2016-12-14 DIAGNOSIS — M1712 Unilateral primary osteoarthritis, left knee: Secondary | ICD-10-CM | POA: Diagnosis not present

## 2016-12-22 ENCOUNTER — Ambulatory Visit (HOSPITAL_COMMUNITY)
Admission: RE | Admit: 2016-12-22 | Discharge: 2016-12-22 | Disposition: A | Discharge status: Home / Self Care | Payer: Medicare Other | Source: Ambulatory Visit | Attending: Cardiovascular Disease | Admitting: Cardiovascular Disease

## 2016-12-22 ENCOUNTER — Other Ambulatory Visit (HOSPITAL_COMMUNITY): Payer: Self-pay | Admitting: Orthopedic Surgery

## 2016-12-22 DIAGNOSIS — M79605 Pain in left leg: Secondary | ICD-10-CM | POA: Diagnosis not present

## 2016-12-22 DIAGNOSIS — M79662 Pain in left lower leg: Secondary | ICD-10-CM | POA: Diagnosis not present

## 2016-12-22 DIAGNOSIS — M7989 Other specified soft tissue disorders: Secondary | ICD-10-CM | POA: Diagnosis not present

## 2016-12-29 ENCOUNTER — Telehealth: Payer: Self-pay | Admitting: Obstetrics & Gynecology

## 2016-12-29 NOTE — Telephone Encounter (Signed)
Patient says she need to come in to have a estring inserted.

## 2016-12-29 NOTE — Telephone Encounter (Signed)
Spoke with patient. Patient would like to come in for Estring placement the first week in September. Patient had Estring removed 10/29/2016, has not placed new ring since due to knee surgery. Appointment scheduled for 01/14/2017 at 3 pm with Dr.Miller. Patient is agreeable to date and time.  Routing to provider for final review. Patient agreeable to disposition. Will close encounter.

## 2017-01-03 ENCOUNTER — Ambulatory Visit: Payer: Medicare Other | Admitting: Family Medicine

## 2017-01-03 NOTE — Progress Notes (Deleted)
Subjective:  [name] is a 76 y.o. year old very pleasant female patient who presents for/with See problem oriented charting ROS- ***   Past Medical History-  Patient Active Problem List   Diagnosis Date Noted  . 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    Priority: High  . Bronchiectasis without acute exacerbation ([place]) 10/18/2015    Priority: High  . Diabetes mellitus type II, controlled ([place]) 12/28/2006    Priority: High  . Aortic atherosclerosis ([place]) 02/16/2016    Priority: Medium  . Solitary pulmonary nodule 01/08/2016    Priority: Medium  . IBS (irritable bowel syndrome) 07/17/2014    Priority: Medium  . Depression 01/03/2014    Priority: Medium  . Essential hypertension, benign 01/03/2014    Priority: Medium  . Fatty liver 03/18/2009    Priority: Medium  . Hyperlipidemia 07/26/2007    Priority: Medium  . ANEMIA, B12 DEFICIENCY 12/29/2006    Priority: Medium  . RESTLESS LEG SYNDROME, MILD 12/28/2006    Priority: Medium  . OSTEOPOROSIS 12/12/2006    Priority: Medium  . Acute diverticulitis 10/05/2015    Priority: Low  . CKD (chronic kidney disease), stage II 01/22/2014    Priority: Low  . Posterior tibial tendon dysfunction 01/03/2014    Priority: Low  . Osteoarthritis, knee 01/03/2014    Priority: Low  . Glaucoma 01/03/2014    Priority: Low  . Obesity (BMI 30-39.9) 06/15/2013    Priority: Low  . Foot tendinitis 07/20/2010    Priority: Low  . INSOMNIA, CHRONIC 07/25/2009    Priority: Low  . GERD 12/25/2007    Priority: Low  . NEUROPATHY, IDIOPATHIC PERIPHERAL NEC 12/28/2006    Priority: Low  . BREAST CANCER, HX OF 12/12/2006    Priority: Low  . Arthritis of midfoot 03/03/2016  . Hammertoes of both feet 03/03/2016  . OA (osteoarthritis) of knee 12/30/2014    Medications- reviewed and updated Current Outpatient Prescriptions  Medication Sig Dispense Refill  . ALPRAZolam (XANAX) 0.25 MG tablet Take 0.25 mg by mouth daily as needed.    [name] atenolol (TENORMIN)  25 MG tablet Take 1 tablet (25 mg total) by mouth daily. 90 tablet 3  . budesonide-formoterol (SYMBICORT) 160-4.5 MCG/ACT inhaler Inhale 2 puffs into the lungs 2 (two) times daily. 1 Inhaler 5  . cetirizine (KLS ALLER-TEC) 10 MG tablet Take 10 mg by mouth daily.    . dorzolamide-timolol (COSOPT) 22.3-6.8 MG/ML ophthalmic solution Place 1 drop into both eyes 2 (two) times daily.     [name] doxycycline (VIBRAMYCIN) 100 MG capsule Take 1 capsule (100 mg total) by mouth 2 (two) times daily. 20 capsule 0  . FLUoxetine (PROZAC) 40 MG capsule Take 40 mg by mouth daily.    [name] glimepiride (AMARYL) 4 MG tablet TAKE ONE TABLET BY MOUTH ONCE DAILY WITH BREAKFAST (Patient taking differently: Take 4 mg by mouth daily with breakfast. TAKE ONE TABLET BY MOUTH ONCE DAILY WITH BREAKFAST) 90 tablet 3  . glucose blood (FREESTYLE LITE) test strip USE TO CHECK BLOOD SUGAR DAILY AND AS NEEDED 100 each 5  . HYDROmorphone (DILAUDID) 2 MG tablet Take 1-2 tablets (2-4 mg total) by mouth every 4 (four) hours as needed for severe pain. 80 tablet 0  . lisinopril (PRINIVIL,ZESTRIL) 10 MG tablet Take 1 tablet (10 mg total) by mouth daily. (Patient taking differently: Take 10 mg by mouth every evening. ) 90 tablet 3  . Loperamide HCl (IMODIUM PO) Take 1 tablet by mouth daily.    [name] LUMIGAN 0.01 %  SOLN Place 1 drop into both eyes at bedtime.     . methocarbamol (ROBAXIN) 500 MG tablet Take 1 tablet (500 mg total) by mouth every 6 (six) hours as needed for muscle spasms. 80 tablet 0  . pramipexole (MIRAPEX) 0.5 MG tablet Take 1 tablet (0.5 mg total) by mouth at bedtime. 90 tablet 1  . Respiratory Therapy Supplies (FLUTTER) DEVI Use as directed 1 each 0  . rivaroxaban (XARELTO) 10 MG TABS tablet Take 1 tablet (10 mg total) by mouth daily with breakfast. Take Xarelto for two and a half more weeks following discharge from the hospital, then discontinue Xarelto. Once the patient has completed the blood thinner regimen, then take a Baby 81 mg  Aspirin daily for three more weeks. (Patient taking differently: Take 10 mg by mouth daily with breakfast. ) 19 tablet 0  . temazepam (RESTORIL) 30 MG capsule Take 1 capsule (30 mg total) by mouth at bedtime as needed. for sleep (Patient taking differently: Take 30 mg by mouth at bedtime. for sleep) 30 capsule 5  . VICTOZA 18 MG/3ML SOPN INJECT 1.8MG  INTO THE SKIN DAILY 3 mL 6   No current facility-administered medications for this visit.     Objective: LMP 05/10/1992  Gen: NAD, resting comfortably CV: RRR no murmurs rubs or gallops Lungs: CTAB no crackles, wheeze, rhonchi Abdomen: soft/nontender/nondistended/normal bowel sounds. No rebound or guarding.  Ext: no edema Skin: warm, dry Neuro: grossly normal, moves all extremities  ***  Assessment/Plan:  S: PHQ9 of ***. Last visit was 9 (poor appetite and fatigue 6 of the 9 points). This was with change form prozac 40mg  to lexapro 20mg . She has declined counseling.   Getting out more, longer,brighter days helped.   Son with seizures and stressful for her.  A/P: ***. NO SI ***  S: controlled on atenolol 25mg  and lisinopril 10mg  ***.  BP Readings from Last 3 Encounters:  12/05/16 (!) 133/50  12/02/16 (!) 134/58  11/24/16 140/61  A/P: We discussed blood pressure goal of <1***40/90. Continue current meds:  ***   S: reasonably controlled. On victozza and amaryl 4mg . Arthritis limits her exercise CBGs- *** last visit 100-120 typically with peak 150 fasting. Denies hypoglycemia *** Lab Results  Component Value Date   HGBA1C 6.9 (H) 10/21/2016   HGBA1C 6.3 07/23/2016   HGBA1C 6.1 02/16/2016   A/P: ***  S: reasonably  controlled on no rx with weight loss in her geriatric years- last LDL 77 October 2017. No myalgias.  A/P: ***  S: insomnia controlled with prn temazepam *** A/P:   There are no Patient Instructions on file for this visit.  No problem-specific Assessment & Plan notes found for this encounter.   Future  Appointments Date Time Provider [place]  01/03/2017 1:30 PM Marin Olp, MD [facility] [place]  01/14/2017 3:00 PM Megan Salon, MD [place] None  01/26/2017 11:00 AM [facility] 1 [facility]  01/31/2017 11:30 AM Juanito Doom, MD [facility] None   No Follow-up on file.  No orders of the defined types were placed in this encounter.   No orders of the defined types were placed in this encounter.   Return precautions advised.  [name], MD

## 2017-01-04 DIAGNOSIS — Z471 Aftercare following joint replacement surgery: Secondary | ICD-10-CM | POA: Diagnosis not present

## 2017-01-04 DIAGNOSIS — Z96652 Presence of left artificial knee joint: Secondary | ICD-10-CM | POA: Diagnosis not present

## 2017-01-04 DIAGNOSIS — M1712 Unilateral primary osteoarthritis, left knee: Secondary | ICD-10-CM | POA: Diagnosis not present

## 2017-01-05 ENCOUNTER — Other Ambulatory Visit: Payer: Medicare Other

## 2017-01-05 ENCOUNTER — Ambulatory Visit (INDEPENDENT_AMBULATORY_CARE_PROVIDER_SITE_OTHER): Payer: Medicare Other | Admitting: Physician Assistant

## 2017-01-05 ENCOUNTER — Encounter: Payer: Self-pay | Admitting: Physician Assistant

## 2017-01-05 VITALS — BP 118/68 | HR 72 | Ht 72.0 in | Wt 250.0 lb

## 2017-01-05 DIAGNOSIS — R110 Nausea: Secondary | ICD-10-CM

## 2017-01-05 DIAGNOSIS — R140 Abdominal distension (gaseous): Secondary | ICD-10-CM

## 2017-01-05 DIAGNOSIS — R197 Diarrhea, unspecified: Secondary | ICD-10-CM | POA: Diagnosis not present

## 2017-01-05 NOTE — Progress Notes (Signed)
Chief Complaint: Diarrhea, nausea, bloating  HPI:  [name] is a 76 year old Caucasian female with a known history of IBS-D, who follows with Dr. Silverio Decamp,  and presents to clinic today with a complaint of increased diarrhea, nausea and bloating.    Patient has had 3 prior colonoscopies, most recently in February 2015 which showed mild diverticulosis and hemorrhoids. When last seen fiber was added as well as cholestyramine.   Today, the patient explains that she recently had a left total knee replacement 11/29/16 and feels like ever since that time she has had a change in bowel habits. She tells me that she has always had problems with diarrhea and tried the "powders" that she was given last appointment but these seem to just sit in her stomach. Patient had been using Imodium, but this is no longer working for her. Patient is having at least 3 or 4 watery stools which are sometimes uncontrollable since being in the hospital for knee surgery. She was on antibiotics then and again within the past month for a bladder infection. Patient describes that she feels bloating not relieved with a bowel movement and generally feels "icky". Associated symptoms include nausea which "comes and goes". She also describes a decrease in appetite over the past month or so.   Patient denies fever, chills, blood in her stool, melena, weight loss, anorexia, vomiting, heartburn, reflux or symptoms that awaken her at night.  Past Medical History:  Diagnosis Date  . Arthritis    right knee;injections every 79months   . Asthma   . Bronchiectasis ([place])   . Cancer (HCC)    HX BREAST CANCER  . COMPRESSION FRACTURE, LUMBAR VERTEBRAE 08/21/2008   Qualifier: Diagnosis of  By: Arnoldo Morale MD, Balinda Quails   . Depression   . Diabetes mellitus    takes Amaryl and Januvia daily  . Difficulty sleeping   . Diverticulitis   . Early cataracts, bilateral   . Eczema   . Endometriosis   . Gastritis   . Glaucoma   . Hammer toe   .  Headache(784.0)    Migraines  . Hypertension    takes Atenolol daily  . IBS (irritable bowel syndrome)   . Joint pain   . Kidney stone   . Neuropathy   . Open wound of second toe of left foot    at pre-op appt, wound appears clear of infection 1 month post removal of toenail, no obvious exudate present nor any redness,  . Osteomyelitis ([place])    left 2nd toe  . Osteopenia   . PONV (postoperative nausea and vomiting)   . Posterior tibial tendon dysfunction    left foot  . Scoliosis   . Severe esophageal dysplasia   . 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 Vitamin D deficiency    takes Vit d daily    Past Surgical History:  Procedure Laterality Date  . ADENOIDECTOMY     at age 41  . biopsy on back     benign  . CATARACT EXTRACTION    . CHOLECYSTECTOMY  06/2008  . COLONOSCOPY  11/02/05  . DILATION AND CURETTAGE OF UTERUS    . ENDOMETRIAL BIOPSY  06/22/2011   benign polyp, no hyperplasia  . excision on breast     internal infected suture from breast surgery  . excision removed from neck     infected lymph node  . FOOT SURGERY     left foot-cleaning out  areas on toes at the wound center-having MRI to determine if osteomyelitis  . HYSTEROSCOPY  06/22/11   PMB submucosal myoma  . JOINT REPLACEMENT     left total shoulder  . LAPAROSCOPIC OOPHORECTOMY Right 12/2004   absent LSO  . LAPAROTOMY    . MASTECTOMY Bilateral 1994 right, 1995 left  . SHOULDER HEMI-ARTHROPLASTY  01/01/2012   Procedure: SHOULDER HEMI-ARTHROPLASTY;  Surgeon: Roseanne Kaufman, MD;  Location: [facility];  Service: Orthopedics;  Laterality: Left;  Left Shoulder Hemi Arthroplasty with Repair and Reconstruction as Necessary   . THYROIDECTOMY     62yrs ago. follows endocrine  . TONSILLECTOMY    . TOTAL KNEE ARTHROPLASTY Right 12/30/2014   Procedure: RIGHT TOTAL KNEE ARTHROPLASTY;  Surgeon: Gaynelle Arabian, MD;  Location: [facility];  Service: Orthopedics;  Laterality: Right;  . TOTAL KNEE  ARTHROPLASTY Left 11/29/2016   Procedure: LEFT TOTAL KNEE ARTHROPLASTY;  Surgeon: Gaynelle Arabian, MD;  Location: [facility];  Service: Orthopedics;  Laterality: Left;    Current Outpatient Prescriptions  Medication Sig Dispense Refill  . ALPRAZolam (XANAX) 0.25 MG tablet Take 0.25 mg by mouth daily as needed.    [name] atenolol (TENORMIN) 25 MG tablet Take 1 tablet (25 mg total) by mouth daily. 90 tablet 3  . dorzolamide-timolol (COSOPT) 22.3-6.8 MG/ML ophthalmic solution Place 1 drop into both eyes 2 (two) times daily.     [name] FLUoxetine (PROZAC) 40 MG capsule Take 40 mg by mouth daily.    [name] glimepiride (AMARYL) 4 MG tablet TAKE ONE TABLET BY MOUTH ONCE DAILY WITH BREAKFAST (Patient taking differently: Take 4 mg by mouth daily with breakfast. TAKE ONE TABLET BY MOUTH ONCE DAILY WITH BREAKFAST) 90 tablet 3  . glucose blood (FREESTYLE LITE) test strip USE TO CHECK BLOOD SUGAR DAILY AND AS NEEDED 100 each 5  . lisinopril (PRINIVIL,ZESTRIL) 10 MG tablet Take 1 tablet (10 mg total) by mouth daily. (Patient taking differently: Take 10 mg by mouth every evening. ) 90 tablet 3  . Loperamide HCl (IMODIUM PO) Take 1 tablet by mouth daily.    [name] LUMIGAN 0.01 % SOLN Place 1 drop into both eyes at bedtime.     . methocarbamol (ROBAXIN) 500 MG tablet Take 1 tablet (500 mg total) by mouth every 6 (six) hours as needed for muscle spasms. 80 tablet 0  . pramipexole (MIRAPEX) 0.5 MG tablet Take 1 tablet (0.5 mg total) by mouth at bedtime. 90 tablet 1  . Respiratory Therapy Supplies (FLUTTER) DEVI Use as directed 1 each 0  . temazepam (RESTORIL) 30 MG capsule Take 1 capsule (30 mg total) by mouth at bedtime as needed. for sleep (Patient taking differently: Take 30 mg by mouth at bedtime. for sleep) 30 capsule 5  . VICTOZA 18 MG/3ML SOPN INJECT 1.8MG  INTO THE SKIN DAILY 3 mL 6  . budesonide-formoterol (SYMBICORT) 160-4.5 MCG/ACT inhaler Inhale 2 puffs into the lungs 2 (two) times daily. 1 Inhaler 5  . HYDROmorphone (DILAUDID)  2 MG tablet Take 1-2 tablets (2-4 mg total) by mouth every 4 (four) hours as needed for severe pain. (Patient not taking: Reported on 01/05/2017) 80 tablet 0   No current facility-administered medications for this visit.     Allergies as of 01/05/2017 - Review Complete 01/05/2017  Allergen Reaction Noted  . Cephalexin Diarrhea 10/14/2006  . Erythromycin ethylsuccinate Hives and Diarrhea 10/14/2006  . Levaquin [levofloxacin in d5w] Other (See Comments) 12/18/2014  . Nitrofurantoin Other (See Comments) 10/14/2006  . Percocet [oxycodone-acetaminophen] Itching 12/31/2011  Family History  Problem Relation Age of Onset  . Arthritis Mother   . COPD Father   . Heart disease Father        MI 74  . Hypertension Father   . Hyperlipidemia Father   . Colon cancer Neg Hx   . Esophageal cancer Neg Hx   . Rectal cancer Neg Hx   . Stomach cancer Neg Hx     Social History   Social History  . Marital status: Married    Spouse name: N/A  . Number of children: N/A  . Years of education: N/A   Occupational History  . Not on file.   Social History Main Topics  . Smoking status: Never Smoker  . Smokeless tobacco: Never Used  . Alcohol use No  . Drug use: No  . Sexual activity: Not on file   Other Topics Concern  . Not on file   Social History Narrative   Married 39 years in 2015. 1 adopted son. 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 Lungs clear to auscultation bilaterally.   No wheezes, crackles, or rhonchi.  Cardiovascular: Normal S1, S2. No MRG. Regular rate and rhythm. No peripheral edema, cyanosis or pallor.  Gastrointestinal:  Soft, nondistended, nontender. No rebound or guarding. Normal bowel sounds. No appreciable masses or hepatomegaly. Psychiatric: Demonstrates good judgement and reason without abnormal affect or behaviors.  MOST RECENT LABS AND IMAGING: CBC    Component Value Date/Time   WBC 10.0 12/02/2016 0554   RBC 3.33 (L) 12/02/2016 0554   HGB 9.9 (L) 12/02/2016 0554   HCT 30.6 (L) 12/02/2016 0554   PLT 207 12/02/2016 0554   MCV 91.9 12/02/2016 0554   MCH 29.7 12/02/2016 0554   MCHC 32.4 12/02/2016 0554   RDW 14.2 12/02/2016 0554   LYMPHSABS 1.4 10/21/2016 1251   MONOABS 0.6 10/21/2016 1251   EOSABS 0.3 10/21/2016 1251   BASOSABS 0.1 10/21/2016 1251    CMP     Component Value Date/Time   NA 137 12/01/2016 0529   K 4.3 12/01/2016 0529   CL 101 12/01/2016 0529   CO2 30 12/01/2016 0529   GLUCOSE 155 (H) 12/01/2016 0529   BUN 14 12/01/2016 0529   CREATININE 0.70 12/01/2016 0529   CREATININE 0.75 06/05/2013 1558   CALCIUM 8.1 (L) 12/01/2016 0529   PROT 6.9 11/24/2016 1442   ALBUMIN 3.7 11/24/2016 1442   AST 19 11/24/2016 1442   ALT 19 11/24/2016 1442   ALKPHOS 75 11/24/2016 1442   BILITOT 0.5 11/24/2016 1442   GFRNONAA >60 12/01/2016 0529   GFRAA >60 12/01/2016 0529    Assessment: 1. Diarrhea: Worsening of diarrhea since recent hospitalization and multiple antibiotics, now 3-4 watery stools per day with a lot of bloating and some nausea; concern for C. difficile vs infectious cause vs exacerbation of IBS-D 2. Nausea 3. Bloating  Plan: 1. Ordered stool studies to include a GI pathogen panel, C. difficile PCR and O&P 2. Patient declined any antinausea medicine or antispasmodic. 3. Patient to follow in clinic per recommendations after stool studies above with Dr. Silverio Decamp  or myself  Ellouise Newer, PA-C [facility] 01/05/2017, 2:19 PM  Cc: Marin Olp, MD

## 2017-01-05 NOTE — Patient Instructions (Signed)
Your physician has requested that you go to the basement for  lab work before leaving today.   

## 2017-01-06 ENCOUNTER — Other Ambulatory Visit: Payer: Medicare Other

## 2017-01-06 DIAGNOSIS — R197 Diarrhea, unspecified: Secondary | ICD-10-CM | POA: Diagnosis not present

## 2017-01-06 DIAGNOSIS — R110 Nausea: Secondary | ICD-10-CM

## 2017-01-06 DIAGNOSIS — R140 Abdominal distension (gaseous): Secondary | ICD-10-CM

## 2017-01-07 LAB — OVA AND PARASITE EXAMINATION: OP: NONE SEEN

## 2017-01-07 LAB — CLOSTRIDIUM DIFFICILE BY PCR: CDIFFPCR: NOT DETECTED

## 2017-01-07 NOTE — Progress Notes (Signed)
Reviewed and agree with documentation and assessment and plan. K. Veena Kyndel Egger , MD   

## 2017-01-11 ENCOUNTER — Telehealth: Payer: Self-pay | Admitting: Physician Assistant

## 2017-01-11 NOTE — Telephone Encounter (Signed)
Pt has been advised that we are still waiting on GI pathogen panel results.  She will be called as soon as reviewed.

## 2017-01-12 ENCOUNTER — Other Ambulatory Visit: Payer: Self-pay

## 2017-01-12 LAB — GASTROINTESTINAL PATHOGEN PANEL PCR
C. DIFFICILE TOX A/B, PCR: NOT DETECTED
CAMPYLOBACTER, PCR: NOT DETECTED
CRYPTOSPORIDIUM, PCR: NOT DETECTED
E COLI 0157, PCR: NOT DETECTED
E coli (ETEC) LT/ST PCR: NOT DETECTED
E coli (STEC) stx1/stx2, PCR: NOT DETECTED
Giardia lamblia, PCR: NOT DETECTED
NOROVIRUS, PCR: NOT DETECTED
Rotavirus A, PCR: NOT DETECTED
SALMONELLA, PCR: NOT DETECTED
SHIGELLA, PCR: NOT DETECTED

## 2017-01-12 MED ORDER — DIPHENOXYLATE-ATROPINE 2.5-0.025 MG PO TABS: 1.0000 | ORAL_TABLET | ORAL | 2 refills | Status: DC | PRN

## 2017-01-13 ENCOUNTER — Telehealth: Payer: Self-pay | Admitting: Physician Assistant

## 2017-01-13 DIAGNOSIS — L84 Corns and callosities: Secondary | ICD-10-CM | POA: Diagnosis not present

## 2017-01-13 DIAGNOSIS — I739 Peripheral vascular disease, unspecified: Secondary | ICD-10-CM | POA: Diagnosis not present

## 2017-01-13 DIAGNOSIS — E1151 Type 2 diabetes mellitus with diabetic peripheral angiopathy without gangrene: Secondary | ICD-10-CM | POA: Diagnosis not present

## 2017-01-13 DIAGNOSIS — L603 Nail dystrophy: Secondary | ICD-10-CM | POA: Diagnosis not present

## 2017-01-13 NOTE — Telephone Encounter (Signed)
The pt has only been taking 1 lomotil every 5 hours.  I have instructed her to take 2 every 4-6 and call tomorrow with an update.

## 2017-01-14 ENCOUNTER — Ambulatory Visit (INDEPENDENT_AMBULATORY_CARE_PROVIDER_SITE_OTHER): Payer: Medicare Other | Admitting: Obstetrics & Gynecology

## 2017-01-14 ENCOUNTER — Encounter: Payer: Self-pay | Admitting: Obstetrics & Gynecology

## 2017-01-14 VITALS — BP 124/56 | HR 86 | Resp 16 | Wt 253.0 lb

## 2017-01-14 DIAGNOSIS — N952 Postmenopausal atrophic vaginitis: Secondary | ICD-10-CM

## 2017-01-14 MED ORDER — ESTRING 2 MG VA RING: VAGINAL_RING | VAGINAL | 4 refills | Status: DC

## 2017-01-14 NOTE — Progress Notes (Signed)
GYNECOLOGY  VISIT   HPI: 76 y.o. G0P0 Married Caucasian female here for estring placement.  Pt has been using this  for treatment of vaginal atrophy and for h/o recurrent yeast vaginitis.  H/O left total knee replacement.  Doing well.  Pain is under good control.  Good flexion and extension of knee.  Biggest issues has been post-op diarrhea.  Has been tested for C diff and several other possible causes (reviewed in EPIC) and all of this was negative.  Started on Immodium which is making her feel sort of loopy and a little off-balance.  Used walker today to come to visit for fear of falling.  Highly encouraged pt to decrease dosage she is taking.  Has not had a BM in almost two days anyway.    Denies vaginal bleeding.  Denies vaginal discharge or itching.  GYNECOLOGIC HISTORY: Patient's last menstrual period was 05/10/1992. Contraception: PMP Menopausal hormone therapy: none  Patient Active Problem List   Diagnosis Date Noted  . Arthritis of midfoot 03/03/2016  . Hammertoes of both feet 03/03/2016  . Aortic atherosclerosis ([place]) 02/16/2016  . Solitary pulmonary nodule 01/08/2016  . 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  . Bronchiectasis without acute exacerbation ([place]) 10/18/2015  . Acute diverticulitis 10/05/2015  . OA (osteoarthritis) of knee 12/30/2014  . IBS (irritable bowel syndrome) 07/17/2014  . CKD (chronic kidney disease), stage II 01/22/2014  . Depression 01/03/2014  . Posterior tibial tendon dysfunction 01/03/2014  . Osteoarthritis, knee 01/03/2014  . Glaucoma 01/03/2014  . Essential hypertension, benign 01/03/2014  . Obesity (BMI 30-39.9) 06/15/2013  . Foot tendinitis 07/20/2010  . INSOMNIA, CHRONIC 07/25/2009  . Fatty liver 03/18/2009  . GERD 12/25/2007  . Hyperlipidemia 07/26/2007  . ANEMIA, B12 DEFICIENCY 12/29/2006  . Diabetes mellitus type II, controlled ([place]) 12/28/2006  . RESTLESS LEG SYNDROME, MILD 12/28/2006  . NEUROPATHY, IDIOPATHIC PERIPHERAL NEC 12/28/2006  .  OSTEOPOROSIS 12/12/2006  . BREAST CANCER, HX OF 12/12/2006    Past Medical History:  Diagnosis Date  . Arthritis    right knee;injections every 58months   . Asthma   . Bronchiectasis ([place])   . Cancer (HCC)    HX BREAST CANCER  . COMPRESSION FRACTURE, LUMBAR VERTEBRAE 08/21/2008   Qualifier: Diagnosis of  By: Arnoldo Morale MD, Balinda Quails   . Depression   . Diabetes mellitus    takes Amaryl and Januvia daily  . Difficulty sleeping   . Diverticulitis   . Early cataracts, bilateral   . Eczema   . Endometriosis   . Gastritis   . Glaucoma   . Hammer toe   . Headache(784.0)    Migraines  . Hypertension    takes Atenolol daily  . IBS (irritable bowel syndrome)   . Joint pain   . Kidney stone   . Neuropathy   . Open wound of second toe of left foot    at pre-op appt, wound appears clear of infection 1 month post removal of toenail, no obvious exudate present nor any redness,  . Osteomyelitis ([place])    left 2nd toe  . Osteopenia   . PONV (postoperative nausea and vomiting)   . Posterior tibial tendon dysfunction    left foot  . Scoliosis   . Severe esophageal dysplasia   . 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 Vitamin D deficiency    takes Vit d daily    Past Surgical History:  Procedure Laterality Date  . ADENOIDECTOMY     at  age 85  . biopsy on back     benign  . CATARACT EXTRACTION    . CHOLECYSTECTOMY  06/2008  . COLONOSCOPY  11/02/05  . DILATION AND CURETTAGE OF UTERUS    . ENDOMETRIAL BIOPSY  06/22/2011   benign polyp, no hyperplasia  . excision on breast     internal infected suture from breast surgery  . excision removed from neck     infected lymph node  . FOOT SURGERY     left foot-cleaning out areas on toes at the wound center-having MRI to determine if osteomyelitis  . HYSTEROSCOPY  06/22/11   PMB submucosal myoma  . JOINT REPLACEMENT     left total shoulder  . LAPAROSCOPIC OOPHORECTOMY Right 12/2004   absent LSO  .  LAPAROTOMY    . MASTECTOMY Bilateral 1994 right, 1995 left  . SHOULDER HEMI-ARTHROPLASTY  01/01/2012   Procedure: SHOULDER HEMI-ARTHROPLASTY;  Surgeon: Roseanne Kaufman, MD;  Location: [facility];  Service: Orthopedics;  Laterality: Left;  Left Shoulder Hemi Arthroplasty with Repair and Reconstruction as Necessary   . THYROIDECTOMY     50yrs ago. follows endocrine  . TONSILLECTOMY    . TOTAL KNEE ARTHROPLASTY Right 12/30/2014   Procedure: RIGHT TOTAL KNEE ARTHROPLASTY;  Surgeon: Gaynelle Arabian, MD;  Location: [facility];  Service: Orthopedics;  Laterality: Right;  . TOTAL KNEE ARTHROPLASTY Left 11/29/2016   Procedure: LEFT TOTAL KNEE ARTHROPLASTY;  Surgeon: Gaynelle Arabian, MD;  Location: [facility];  Service: Orthopedics;  Laterality: Left;    MEDS:   Current Outpatient Prescriptions on File Prior to Visit  Medication Sig Dispense Refill  . ALPRAZolam (XANAX) 0.25 MG tablet Take 0.25 mg by mouth daily as needed.    [name] atenolol (TENORMIN) 25 MG tablet Take 1 tablet (25 mg total) by mouth daily. 90 tablet 3  . diphenoxylate-atropine (LOMOTIL) 2.5-0.025 MG tablet Take 1-2 tablets by mouth every 5 (five) hours as needed for diarrhea or loose stools. 30 tablet 2  . dorzolamide-timolol (COSOPT) 22.3-6.8 MG/ML ophthalmic solution Place 1 drop into both eyes 2 (two) times daily.     [name] FLUoxetine (PROZAC) 40 MG capsule Take 40 mg by mouth daily.    [name] glimepiride (AMARYL) 4 MG tablet TAKE ONE TABLET BY MOUTH ONCE DAILY WITH BREAKFAST (Patient taking differently: Take 4 mg by mouth daily with breakfast. TAKE ONE TABLET BY MOUTH ONCE DAILY WITH BREAKFAST) 90 tablet 3  . glucose blood (FREESTYLE LITE) test strip USE TO CHECK BLOOD SUGAR DAILY AND AS NEEDED 100 each 5  . HYDROmorphone (DILAUDID) 2 MG tablet Take 1-2 tablets (2-4 mg total) by mouth every 4 (four) hours as needed for severe pain. 80 tablet 0  . lisinopril (PRINIVIL,ZESTRIL) 10 MG tablet Take 1 tablet (10 mg total) by mouth daily. (Patient taking differently:  Take 10 mg by mouth every evening. ) 90 tablet 3  . Loperamide HCl (IMODIUM PO) Take 1 tablet by mouth daily.    [name] LUMIGAN 0.01 % SOLN Place 1 drop into both eyes at bedtime.     . methocarbamol (ROBAXIN) 500 MG tablet Take 1 tablet (500 mg total) by mouth every 6 (six) hours as needed for muscle spasms. 80 tablet 0  . pramipexole (MIRAPEX) 0.5 MG tablet Take 1 tablet (0.5 mg total) by mouth at bedtime. 90 tablet 1  . Respiratory Therapy Supplies (FLUTTER) DEVI Use as directed 1 each 0  . temazepam (RESTORIL) 30 MG capsule Take 1 capsule (30 mg total) by mouth at bedtime as  needed. for sleep (Patient taking differently: Take 30 mg by mouth at bedtime. for sleep) 30 capsule 5  . VICTOZA 18 MG/3ML SOPN INJECT 1.8MG  INTO THE SKIN DAILY 3 mL 6  . budesonide-formoterol (SYMBICORT) 160-4.5 MCG/ACT inhaler Inhale 2 puffs into the lungs 2 (two) times daily. 1 Inhaler 5   No current facility-administered medications on file prior to visit.     ALLERGIES: Cephalexin; Erythromycin ethylsuccinate; Levaquin [levofloxacin in d5w]; Nitrofurantoin; and Percocet [oxycodone-acetaminophen]  Family History  Problem Relation Age of Onset  . Arthritis Mother   . COPD Father   . Heart disease Father        MI 67  . Hypertension Father   . Hyperlipidemia Father   . Colon cancer Neg Hx   . Esophageal cancer Neg Hx   . Rectal cancer Neg Hx   . Stomach cancer Neg Hx     SH:  Married, non smoker  Review of Systems  Constitutional: Positive for malaise/fatigue.  Respiratory: Negative.   Cardiovascular: Negative.   Gastrointestinal: Positive for diarrhea.  Genitourinary: Negative.   Neurological: Positive for weakness.    PHYSICAL EXAMINATION:    BP (!) 124/56 (BP Location: Right Arm, Patient Position: Sitting, Cuff Size: Large)   Pulse 86   Resp 16   Wt 253 lb (114.8 kg)   LMP 05/10/1992   BMI 34.31 kg/m     General appearance: alert, cooperative and appears stated age Abdomen: soft, non-tender;  bowel sounds normal; no masses,  no organomegaly  Pelvic: External genitalia:  no lesions              Urethra:  normal appearing urethra with no masses, tenderness or lesions              Bartholins and Skenes: normal                 Vagina: normal appearing vagina with normal color and discharge, no lesions              Cervix: no lesions              Estring placed without difficulty.  Chaperone was present for exam.  Assessment: Vaginal atrophic changes H/O post operative diarrhea, now on Immodium  Plan: Return 3 months.   Rx for Estring 2mg  every three months, #1/4RF to pharmacy Highly encouraged pt to decrease immodium use due to constipation and neurologic side effects she is experiencing.   ~15 minutes spent with patient >50% of time was in face to face discussion of above.

## 2017-01-17 DIAGNOSIS — H04123 Dry eye syndrome of bilateral lacrimal glands: Secondary | ICD-10-CM | POA: Diagnosis not present

## 2017-01-17 DIAGNOSIS — H401133 Primary open-angle glaucoma, bilateral, severe stage: Secondary | ICD-10-CM | POA: Diagnosis not present

## 2017-01-18 ENCOUNTER — Ambulatory Visit: Payer: Medicare Other | Admitting: Nurse Practitioner

## 2017-01-19 ENCOUNTER — Telehealth: Payer: Self-pay | Admitting: Gastroenterology

## 2017-01-19 NOTE — Telephone Encounter (Signed)
The pt has been advised that lomotil may make her sleepy and she can try to take it at bedtime.  She has an appt next week with Anderson Malta and will keep that appt to discuss.

## 2017-01-26 ENCOUNTER — Inpatient Hospital Stay: Admission: RE | Admit: 2017-01-26 | Payer: Medicare Other | Source: Ambulatory Visit

## 2017-01-26 ENCOUNTER — Ambulatory Visit (INDEPENDENT_AMBULATORY_CARE_PROVIDER_SITE_OTHER): Payer: Medicare Other | Admitting: Physician Assistant

## 2017-01-26 ENCOUNTER — Ambulatory Visit (INDEPENDENT_AMBULATORY_CARE_PROVIDER_SITE_OTHER)
Admission: RE | Admit: 2017-01-26 | Discharge: 2017-01-26 | Disposition: A | Discharge status: Home / Self Care | Payer: Medicare Other | Source: Ambulatory Visit | Attending: Pulmonary Disease | Admitting: Pulmonary Disease

## 2017-01-26 ENCOUNTER — Encounter: Payer: Self-pay | Admitting: Physician Assistant

## 2017-01-26 VITALS — BP 106/62 | HR 80 | Ht 72.0 in | Wt 252.0 lb

## 2017-01-26 DIAGNOSIS — R197 Diarrhea, unspecified: Secondary | ICD-10-CM

## 2017-01-26 DIAGNOSIS — R918 Other nonspecific abnormal finding of lung field: Secondary | ICD-10-CM | POA: Diagnosis not present

## 2017-01-26 DIAGNOSIS — R911 Solitary pulmonary nodule: Secondary | ICD-10-CM | POA: Diagnosis not present

## 2017-01-26 MED ORDER — DIFENOXIN-ATROPINE 1-0.025 MG PO TABS: 1.0000 | ORAL_TABLET | ORAL | 0 refills | Status: DC

## 2017-01-26 NOTE — Patient Instructions (Addendum)
We have sent the following medications to your pharmacy for you to pick up at your convenience: Motofen 1 tablet every 4 hours    We have given you a low FODMAP diet.   Please call the office with an update of your symptoms in the next two weeks and ask for Katina Degree, RN.

## 2017-01-26 NOTE — Progress Notes (Signed)
Chief Complaint: Diarrhea  HPI:  [name] is a 76 year old Caucasian female with a known history of IBS-D, who follows with Dr. Silverio Decamp and presents to clinic today for continued complaints of diarrhea and bloating.   Please recall patient has had 3 prior colonoscopies, most recently in February 2015 which showed mild diverticulosis and hemorrhoids. At time of last visit 01/05/17 patient described a recent left total knee replacement 11/29/16 and felt that ever since that time she had a change in bowel habits. She has chronic problems with diarrhea but an increase in symptoms with 3-4 watery stools which were sometimes uncontrollable since being in the hospital for her knee surgery. Her Imodium was no longer working and she described a lot of accompanying gas. At that time stool studies were ordered including C. difficile, O&P and GI pathogen panel. These returned negative. Patient was started on Lomotil and has been taking 2 tabs every morning and 2 tabs daily at bedtime.   Patient presents to clinic today accompanied by her husband and tells me that nothing has changed. She has continued with a few episodes of fecal incontinence, the last yesterday while waiting in the car for her husband and has at least 2 if not 5 loose bowel movements per day irregardless of using her Lomotil 2 in the morning and 2 at night. Patient not had a solid bowel movement since time of her knee replacement surgery. She is afraid to eat due to loose stools afterwards. She continues with a lot of bloating and gas and abdominal discomfort. Patient is depressed as she is not able to do her physical therapy for her knee    Patient denies fever, chills, blood in her stool, melena, weight loss, anorexia, nausea, vomiting, heartburn, reflux or symptoms that awaken her at night.  Past Medical History:  Diagnosis Date  . Arthritis    right knee;injections every 3months   . Asthma   . Bronchiectasis ([place])   . Cancer (HCC)    HX  BREAST CANCER  . COMPRESSION FRACTURE, LUMBAR VERTEBRAE 08/21/2008   Qualifier: Diagnosis of  By: Arnoldo Morale MD, Balinda Quails   . Depression   . Diabetes mellitus    takes Amaryl and Januvia daily  . Difficulty sleeping   . Diverticulitis   . Early cataracts, bilateral   . Eczema   . Endometriosis   . Gastritis   . Glaucoma   . Hammer toe   . Headache(784.0)    Migraines  . Hypertension    takes Atenolol daily  . IBS (irritable bowel syndrome)   . Joint pain   . Kidney stone   . Neuropathy   . Open wound of second toe of left foot    at pre-op appt, wound appears clear of infection 1 month post removal of toenail, no obvious exudate present nor any redness,  . Osteomyelitis ([place])    left 2nd toe  . Osteopenia   . PONV (postoperative nausea and vomiting)   . Posterior tibial tendon dysfunction    left foot  . Scoliosis   . Severe esophageal dysplasia   . 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 Vitamin D deficiency    takes Vit d daily    Past Surgical History:  Procedure Laterality Date  . ADENOIDECTOMY     at age 39  . biopsy on back     benign  . CATARACT EXTRACTION    . CHOLECYSTECTOMY  06/2008  .  COLONOSCOPY  11/02/05  . DILATION AND CURETTAGE OF UTERUS    . ENDOMETRIAL BIOPSY  06/22/2011   benign polyp, no hyperplasia  . excision on breast     internal infected suture from breast surgery  . excision removed from neck     infected lymph node  . FOOT SURGERY     left foot-cleaning out areas on toes at the wound center-having MRI to determine if osteomyelitis  . HYSTEROSCOPY  06/22/11   PMB submucosal myoma  . JOINT REPLACEMENT     left total shoulder  . LAPAROSCOPIC OOPHORECTOMY Right 12/2004   absent LSO  . LAPAROTOMY    . MASTECTOMY Bilateral 1994 right, 1995 left  . SHOULDER HEMI-ARTHROPLASTY  01/01/2012   Procedure: SHOULDER HEMI-ARTHROPLASTY;  Surgeon: Roseanne Kaufman, MD;  Location: [facility];  Service: Orthopedics;  Laterality:  Left;  Left Shoulder Hemi Arthroplasty with Repair and Reconstruction as Necessary   . THYROIDECTOMY     32yrs ago. follows endocrine  . TONSILLECTOMY    . TOTAL KNEE ARTHROPLASTY Right 12/30/2014   Procedure: RIGHT TOTAL KNEE ARTHROPLASTY;  Surgeon: Gaynelle Arabian, MD;  Location: [facility];  Service: Orthopedics;  Laterality: Right;  . TOTAL KNEE ARTHROPLASTY Left 11/29/2016   Procedure: LEFT TOTAL KNEE ARTHROPLASTY;  Surgeon: Gaynelle Arabian, MD;  Location: [facility];  Service: Orthopedics;  Laterality: Left;    Current Outpatient Prescriptions  Medication Sig Dispense Refill  . ALPRAZolam (XANAX) 0.25 MG tablet Take 0.25 mg by mouth daily as needed.    [name] atenolol (TENORMIN) 25 MG tablet Take 1 tablet (25 mg total) by mouth daily. 90 tablet 3  . diphenoxylate-atropine (LOMOTIL) 2.5-0.025 MG tablet Take 1-2 tablets by mouth every 5 (five) hours as needed for diarrhea or loose stools. 30 tablet 2  . dorzolamide-timolol (COSOPT) 22.3-6.8 MG/ML ophthalmic solution Place 1 drop into both eyes 2 (two) times daily.     [name] ESTRING 2 MG vaginal ring 1 ring pv every 3 months 1 each 4  . FLUoxetine (PROZAC) 40 MG capsule Take 40 mg by mouth daily.    [name] glimepiride (AMARYL) 4 MG tablet TAKE ONE TABLET BY MOUTH ONCE DAILY WITH BREAKFAST (Patient taking differently: Take 4 mg by mouth daily with breakfast. TAKE ONE TABLET BY MOUTH ONCE DAILY WITH BREAKFAST) 90 tablet 3  . glucose blood (FREESTYLE LITE) test strip USE TO CHECK BLOOD SUGAR DAILY AND AS NEEDED 100 each 5  . lisinopril (PRINIVIL,ZESTRIL) 10 MG tablet Take 1 tablet (10 mg total) by mouth daily. (Patient taking differently: Take 10 mg by mouth every evening. ) 90 tablet 3  . Loperamide HCl (IMODIUM PO) Take 1 tablet by mouth daily.    [name] LUMIGAN 0.01 % SOLN Place 1 drop into both eyes at bedtime.     . pramipexole (MIRAPEX) 0.5 MG tablet Take 1 tablet (0.5 mg total) by mouth at bedtime. 90 tablet 1  . Respiratory Therapy Supplies (FLUTTER) DEVI Use as  directed 1 each 0  . temazepam (RESTORIL) 30 MG capsule Take 1 capsule (30 mg total) by mouth at bedtime as needed. for sleep (Patient taking differently: Take 30 mg by mouth at bedtime. for sleep) 30 capsule 5  . VICTOZA 18 MG/3ML SOPN INJECT 1.8MG  INTO THE SKIN DAILY 3 mL 6  . budesonide-formoterol (SYMBICORT) 160-4.5 MCG/ACT inhaler Inhale 2 puffs into the lungs 2 (two) times daily. 1 Inhaler 5  . Difenoxin-Atropine (MOTOFEN) 1-0.025 MG TABS Take 1 tablet by mouth every 4 (four) hours. 120 each  0   No current facility-administered medications for this visit.     Allergies as of 01/26/2017 - Review Complete 01/26/2017  Allergen Reaction Noted  . Cephalexin Diarrhea 10/14/2006  . Erythromycin ethylsuccinate Hives and Diarrhea 10/14/2006  . Levaquin [levofloxacin in d5w] Other (See Comments) 12/18/2014  . Nitrofurantoin Other (See Comments) 10/14/2006  . Percocet [oxycodone-acetaminophen] Itching 12/31/2011    Family History  Problem Relation Age of Onset  . Arthritis Mother   . COPD Father   . Heart disease Father        MI 53  . Hypertension Father   . Hyperlipidemia Father   . Colon cancer Neg Hx   . Esophageal cancer Neg Hx   . Rectal cancer Neg Hx   . Stomach cancer Neg Hx     Social History   Social History  . Marital status: Married    Spouse name: N/A  . Number of children: N/A  . Years of education: N/A   Occupational History  . Not on file.   Social History Main Topics  . Smoking status: Never Smoker  . Smokeless tobacco: Never Used  . Alcohol use No  . Drug use: No  . Sexual activity: Not on file   Other Topics Concern  . Not on file   Social History Narrative   Married 39 years in 2015. 1 adopted son. 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 Lungs clear to auscultation bilaterally.   No wheezes, crackles, or rhonchi.  Cardiovascular: Normal S1, S2. No MRG. Regular rate and rhythm. No peripheral edema, cyanosis or pallor.  Gastrointestinal:  Soft, nondistended, nontender. No rebound or guarding. Normal bowel sounds. No appreciable masses or hepatomegaly. Psychiatric: Demonstrates good judgement and reason without abnormal affect or behaviors.  MOST RECENT LABS AND IMAGING: CBC    Component Value Date/Time   WBC 10.0 12/02/2016 0554   RBC 3.33 (L) 12/02/2016 0554   HGB 9.9 (L) 12/02/2016 0554   HCT 30.6 (L) 12/02/2016 0554   PLT 207 12/02/2016 0554   MCV 91.9 12/02/2016 0554   MCH 29.7 12/02/2016 0554   MCHC 32.4 12/02/2016 0554   RDW 14.2 12/02/2016 0554   LYMPHSABS 1.4 10/21/2016 1251   MONOABS 0.6 10/21/2016 1251   EOSABS 0.3 10/21/2016 1251   BASOSABS 0.1 10/21/2016 1251    CMP     Component Value Date/Time   NA 137 12/01/2016 0529   K 4.3 12/01/2016 0529   CL 101 12/01/2016 0529   CO2 30 12/01/2016 0529   GLUCOSE 155 (H) 12/01/2016 0529   BUN 14 12/01/2016 0529   CREATININE 0.70 12/01/2016 0529   CREATININE 0.75 06/05/2013 1558   CALCIUM 8.1 (L) 12/01/2016 0529   PROT 6.9 11/24/2016 1442   ALBUMIN 3.7 11/24/2016 1442   AST 19 11/24/2016 1442   ALT 19 11/24/2016 1442   ALKPHOS  75 11/24/2016 1442   BILITOT 0.5 11/24/2016 1442   GFRNONAA >60 12/01/2016 0529   GFRAA >60 12/01/2016 0529    Assessment: 1. Diarrhea: Patient continues with diarrhea 2-5 stools per day and some incontinence irregardless of Lomotil 2 AM and 2 PM, recent stool studies including GI  pathogen panel, C. difficile and O&P were all negative, patient has had exacerbation of symptoms ever since a knee surgery in July; again this likely represents patient's IBS-D  Plan: 1. Discussed IBS-D in detail. Did provide patient with low FODMAP diet. Instructed her that she could at least look the do not eat side to see if there was anything she was eating a lot of which could be irritating her stomach. 2. Changed patient from Lomotil to Motofen 1 tablet every 4 hours 3. If above does not work within the next week can add Dicyclomine 20 mg 20-30 minutes before eating and at bedtime 4. Patient is to call our office/we will check an in 1 week to see how her symptoms are 5. Did discuss repeat colonoscopy, I do not believe this would be of very high yield, patient would like to hold as she has had so much anesthesia lately. 6. Made an appointment with Dr. Silverio Decamp for the patient at her next available time slot in late November  Ellouise Newer, PA-C [facility] 01/26/2017, 3:10 PM  Cc: Marin Olp, MD

## 2017-01-27 ENCOUNTER — Telehealth: Payer: Self-pay | Admitting: Emergency Medicine

## 2017-01-27 NOTE — Telephone Encounter (Signed)
Spoke to [employer] [facility] pharmacist to obtain a prior authorization for [place]. Prior Auth was obtained until May 09, 2017.

## 2017-01-28 NOTE — Progress Notes (Signed)
Reviewed and agree with documentation and assessment and plan. K. Veena Nandigam , MD   

## 2017-01-31 ENCOUNTER — Telehealth: Payer: Self-pay | Admitting: Pulmonary Disease

## 2017-01-31 ENCOUNTER — Ambulatory Visit (INDEPENDENT_AMBULATORY_CARE_PROVIDER_SITE_OTHER): Payer: Medicare Other | Admitting: Pulmonary Disease

## 2017-01-31 ENCOUNTER — Encounter: Payer: Self-pay | Admitting: Pulmonary Disease

## 2017-01-31 ENCOUNTER — Telehealth: Payer: Self-pay | Admitting: Physician Assistant

## 2017-01-31 VITALS — BP 114/62 | HR 88 | Ht 72.0 in | Wt 252.2 lb

## 2017-01-31 DIAGNOSIS — J479 Bronchiectasis, uncomplicated: Secondary | ICD-10-CM

## 2017-01-31 DIAGNOSIS — Z23 Encounter for immunization: Secondary | ICD-10-CM

## 2017-01-31 DIAGNOSIS — J301 Allergic rhinitis due to pollen: Secondary | ICD-10-CM

## 2017-01-31 NOTE — Progress Notes (Signed)
Subjective:    Patient ID: [name], female    DOB: [birth year], 76 y.o.   MRN: [id number]  Synopsis: First evaluated by [facility] in 2017 in the setting of a chronic cough and recurrent respiratory infections. She had many childhood illnesses, but she doesn't recall one severe childhood illness.  She was found in July 2017 on high-resolution CT scanning of the chest to have bronchiectasis and severe tracheomalacia. Immunoglobulin testing an alpha-1 testing were normal.   HPI Chief Complaint  Patient presents with  . Follow-up    CT scan done 01/26/17. Pt c/o hoarseness as well as prod. cough with clear to yellow mucus along with SOB. Denies any CP.   [name] says she has been having more mucus in her throat lately.  No heartburn, sinus complaints.  No new fevers, chills weight loss.  She notes a ragweed allergy.  She takes an "allertec" pill from [employer], perhaps for zyrtec.    She had a left total knee in 12/2016 by Dr. Ricki Rodriguez.  She did her physical therapy by Skype.      Past Medical History:  Diagnosis Date  . Arthritis    right knee;injections every 34months   . Asthma   . Bronchiectasis ([place])   . Cancer (HCC)    HX BREAST CANCER  . COMPRESSION FRACTURE, LUMBAR VERTEBRAE 08/21/2008   Qualifier: Diagnosis of  By: Arnoldo Morale MD, Balinda Quails   . Depression   . Diabetes mellitus    takes Amaryl and Januvia daily  . Difficulty sleeping   . Diverticulitis   . Early cataracts, bilateral   . Eczema   . Endometriosis   . Gastritis   . Glaucoma   . Hammer toe   . Headache(784.0)    Migraines  . Hypertension    takes Atenolol daily  . IBS (irritable bowel syndrome)   . Joint pain   . Kidney stone   . Neuropathy   . Open wound of second toe of left foot    at pre-op appt, wound appears clear of infection 1 month post removal of toenail, no obvious exudate present nor any redness,  . Osteomyelitis ([place])    left 2nd toe  . Osteopenia   . PONV (postoperative nausea and  vomiting)   . Posterior tibial tendon dysfunction    left foot  . Scoliosis   . Severe esophageal dysplasia   . 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 Vitamin D deficiency    takes Vit d daily      Review of Systems  Constitutional: Negative for chills, fatigue and fever.  HENT: Negative for postnasal drip, rhinorrhea, sinus pressure and sore throat.   Respiratory: Positive for shortness of breath. Negative for cough and wheezing.   Cardiovascular: Negative for chest pain and leg swelling.       Objective:   Physical Exam Vitals:   01/31/17 1150  BP: 114/62  Pulse: 88  SpO2: 94%  Weight: 252 lb 3.2 oz (114.4 kg)  Height: 6' (1.829 m)  RA  Gen: well appearing HENT: OP clear, TM's clear, neck supple PULM: CTA B, normal percussion CV: RRR, no mgr, trace edema GI: BS+, soft, nontender Derm: no cyanosis or rash Psyche: normal mood and affect    CBC    Component Value Date/Time   WBC 10.0 12/02/2016 0554   RBC 3.33 (L) 12/02/2016 0554   HGB 9.9 (L) 12/02/2016 0554   HCT 30.6 (  L) 12/02/2016 0554   PLT 207 12/02/2016 0554   MCV 91.9 12/02/2016 0554   MCH 29.7 12/02/2016 0554   MCHC 32.4 12/02/2016 0554   RDW 14.2 12/02/2016 0554   LYMPHSABS 1.4 10/21/2016 1251   MONOABS 0.6 10/21/2016 1251   EOSABS 0.3 10/21/2016 1251   BASOSABS 0.1 10/21/2016 1251   PFT: July 2017 pulmonary function testing ratio 77%, FEV1 2.02 L, 71% predicted, FVC 2.63 L 70% predicted, total lung capacity 5.02 L, 81% predicted, DLCO 25.7 274% predicted       Assessment & Plan:   Bronchiectasis without complication (HCC)  Seasonal allergic rhinitis due to pollen  Discussion: This has been a stable interval for [facility]. The CT scan of her chest last week showed no change in the pulmonary nodule so there is no indication for further imaging at this point.  She has some mucus in her throat right now but she does not have signs or symptoms of a  bronchiectasis flare up. I advised her that if she has fevers, chills, weight loss, or increasing shortness of breath with increasing mucus production this would be suggestive of a bronchiectasis flare and we would need to see her and treat her with antibiotics.  Plan: For bronchiectasis Flu shot today Call us if you have increasing mucus production, fevers, chills, weight loss, or shortness of breath  For congestion in your throat, likely ragweed allergy Continue generic Zyrtec Start generic fluticasone 2 sprays each nostril every day  We will see you back in 1 year or sooner if needed    Current Outpatient Prescriptions:  .  ALPRAZolam (XANAX) 0.25 MG tablet, Take 0.25 mg by mouth daily as needed., Disp: , Rfl:  .  atenolol (TENORMIN) 25 MG tablet, Take 1 tablet (25 mg total) by mouth daily., Disp: 90 tablet, Rfl: 3 .  budesonide-formoterol (SYMBICORT) 160-4.5 MCG/ACT inhaler, Inhale 2 puffs into the lungs 2 (two) times daily., Disp: , Rfl:  .  Difenoxin-Atropine (MOTOFEN) 1-0.025 MG TABS, Take 1 tablet by mouth every 4 (four) hours., Disp: 120 each, Rfl: 0 .  dorzolamide-timolol (COSOPT) 22.3-6.8 MG/ML ophthalmic solution, Place 1 drop into both eyes 2 (two) times daily. , Disp: , Rfl:  .  ESTRING 2 MG vaginal ring, 1 ring pv every 3 months, Disp: 1 each, Rfl: 4 .  FLUoxetine (PROZAC) 40 MG capsule, Take 40 mg by mouth daily., Disp: , Rfl:  .  glimepiride (AMARYL) 4 MG tablet, TAKE ONE TABLET BY MOUTH ONCE DAILY WITH BREAKFAST (Patient taking differently: Take 4 mg by mouth daily with breakfast. TAKE ONE TABLET BY MOUTH ONCE DAILY WITH BREAKFAST), Disp: 90 tablet, Rfl: 3 .  glucose blood (FREESTYLE LITE) test strip, USE TO CHECK BLOOD SUGAR DAILY AND AS NEEDED, Disp: 100 each, Rfl: 5 .  lisinopril (PRINIVIL,ZESTRIL) 10 MG tablet, Take 1 tablet (10 mg total) by mouth daily. (Patient taking differently: Take 10 mg by mouth every evening. ), Disp: 90 tablet, Rfl: 3 .  LUMIGAN 0.01 % SOLN,  Place 1 drop into both eyes at bedtime. , Disp: , Rfl:  .  pramipexole (MIRAPEX) 0.5 MG tablet, Take 1 tablet (0.5 mg total) by mouth at bedtime., Disp: 90 tablet, Rfl: 1 .  Respiratory Therapy Supplies (FLUTTER) DEVI, Use as directed, Disp: 1 each, Rfl: 0 .  temazepam (RESTORIL) 30 MG capsule, Take 1 capsule (30 mg total) by mouth at bedtime as needed. for sleep (Patient taking differently: Take 30 mg by mouth at bedtime. for sleep), Disp:  30 capsule, Rfl: 5 .  VICTOZA 18 MG/3ML SOPN, INJECT 1.8MG  INTO THE SKIN DAILY, Disp: 3 mL, Rfl: 6 .  budesonide-formoterol (SYMBICORT) 160-4.5 MCG/ACT inhaler, Inhale 2 puffs into the lungs 2 (two) times daily., Disp: 1 Inhaler, Rfl: 5

## 2017-01-31 NOTE — Patient Instructions (Signed)
For bronchiectasis Flu shot today Call us if you have increasing mucus production, fevers, chills, weight loss, or shortness of breath  For congestion in your throat, likely ragweed allergy Continue generic Zyrtec Start generic fluticasone 2 sprays each nostril every day  We will see you back in 1 year or sooner if needed

## 2017-01-31 NOTE — Telephone Encounter (Signed)
The pt states that with the motofen every 4 hours she has had a decrease in frequency but continues to have very watery stools about 3 daily.  Per your note do you want to add dicyclomine 20 mg to her regimen?  Please advise.

## 2017-01-31 NOTE — Telephone Encounter (Signed)
Pt advised and nothing further is needed.

## 2017-01-31 NOTE — Telephone Encounter (Signed)
Notes recorded by Juanito Doom, MD on 01/28/2017 at 3:13 PM EDT A, Please let the patient know this was OK Thanks, B

## 2017-02-01 ENCOUNTER — Ambulatory Visit (INDEPENDENT_AMBULATORY_CARE_PROVIDER_SITE_OTHER): Payer: Medicare Other | Admitting: Family Medicine

## 2017-02-01 ENCOUNTER — Encounter: Payer: Self-pay | Admitting: Family Medicine

## 2017-02-01 VITALS — BP 118/62 | HR 88 | Temp 98.8°F | Ht 72.0 in | Wt 251.2 lb

## 2017-02-01 DIAGNOSIS — E119 Type 2 diabetes mellitus without complications: Secondary | ICD-10-CM | POA: Diagnosis not present

## 2017-02-01 DIAGNOSIS — I10 Essential (primary) hypertension: Secondary | ICD-10-CM | POA: Diagnosis not present

## 2017-02-01 DIAGNOSIS — E538 Deficiency of other specified B group vitamins: Secondary | ICD-10-CM | POA: Diagnosis not present

## 2017-02-01 DIAGNOSIS — F325 Major depressive disorder, single episode, in full remission: Secondary | ICD-10-CM

## 2017-02-01 LAB — COMPREHENSIVE METABOLIC PANEL
ALK PHOS: 81 U/L (ref 39–117)
ALT: 12 U/L (ref 0–35)
AST: 12 U/L (ref 0–37)
Albumin: 3.9 g/dL (ref 3.5–5.2)
BILIRUBIN TOTAL: 0.6 mg/dL (ref 0.2–1.2)
BUN: 9 mg/dL (ref 6–23)
CO2: 33 meq/L — AB (ref 19–32)
CREATININE: 0.64 mg/dL (ref 0.40–1.20)
Calcium: 9.4 mg/dL (ref 8.4–10.5)
Chloride: 100 mEq/L (ref 96–112)
GFR: 95.62 mL/min (ref >60.00)
Glucose, Bld: 121 mg/dL — ABNORMAL HIGH (ref 70–99)
Potassium: 4.3 mEq/L (ref 3.5–5.1)
Sodium: 139 mEq/L (ref 135–145)
TOTAL PROTEIN: 6.3 g/dL (ref 6.0–8.3)

## 2017-02-01 LAB — CBC WITH DIFFERENTIAL/PLATELET
BASOS ABS: 0 10*3/uL (ref 0.0–0.1)
Basophils Relative: 0.5 % (ref 0.0–3.0)
EOS ABS: 0.3 10*3/uL (ref 0.0–0.7)
Eosinophils Relative: 3.5 % (ref 0.0–5.0)
HCT: 40.8 % (ref 36.0–46.0)
Hemoglobin: 13 g/dL (ref 12.0–15.0)
LYMPHS ABS: 1.4 10*3/uL (ref 0.7–4.0)
LYMPHS PCT: 14.1 % (ref 12.0–46.0)
MCHC: 32 g/dL (ref 30.0–36.0)
MCV: 91.7 fl (ref 78.0–100.0)
MONOS PCT: 7 % (ref 3.0–12.0)
Monocytes Absolute: 0.7 10*3/uL (ref 0.1–1.0)
NEUTROS ABS: 7.2 10*3/uL (ref 1.4–7.7)
NEUTROS PCT: 74.9 % (ref 43.0–77.0)
PLATELETS: 292 10*3/uL (ref 150.0–400.0)
RBC: 4.45 Mil/uL (ref 3.87–5.11)
RDW: 15.2 % (ref 11.5–15.5)
WBC: 9.6 10*3/uL (ref 4.0–10.5)

## 2017-02-01 LAB — HEMOGLOBIN A1C: Hgb A1c MFr Bld: 6.1 % (ref 4.6–6.5)

## 2017-02-01 MED ORDER — CYANOCOBALAMIN 1000 MCG/ML IJ SOLN
1000.0000 ug | Freq: Once | INTRAMUSCULAR | Status: AC
  Administered 2017-02-01: 1000 ug via INTRAMUSCULAR

## 2017-02-01 MED ORDER — DICYCLOMINE HCL 10 MG PO CAPS: 20.0000 mg | ORAL_CAPSULE | Freq: Three times a day (TID) | ORAL | 3 refills | Status: DC

## 2017-02-01 NOTE — Telephone Encounter (Signed)
Yes, please add dicyclomine 20mg  QID before meals and at bedtime. Thanks-JLL

## 2017-02-01 NOTE — Telephone Encounter (Signed)
The pt is aware and will try the dicyclomine and return call in 1-2 weeks with an update.

## 2017-02-01 NOTE — Patient Instructions (Signed)
Please stop by lab before you go  Continue your home PT exercises. Hopefully you can be released for strengthening of the leg more aggressively. I like the trainer idea- with PT in person back up if gait and balance continue to be an issue

## 2017-02-01 NOTE — Progress Notes (Signed)
Subjective:  [name] is a 76 y.o. year old very pleasant female patient who presents for/with See problem oriented charting ROS- admits to fatigue. States no SI. Minimal anhedonia or depressed mood- mainly depressed mood about IBS symptoms and urgency of defecation   Past Medical History-  Patient Active Problem List   Diagnosis Date Noted  . 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    Priority: High  . Bronchiectasis without acute exacerbation ([name]) 10/18/2015    Priority: High  . Diabetes mellitus type II, controlled ([name]) 12/28/2006    Priority: High  . Aortic atherosclerosis ([name]) 02/16/2016    Priority: Medium  . Solitary pulmonary nodule 01/08/2016    Priority: Medium  . IBS (irritable bowel syndrome) 07/17/2014    Priority: Medium  . Depression, major, single episode, complete remission ([name]) 01/03/2014    Priority: Medium  . Essential hypertension, benign 01/03/2014    Priority: Medium  . Fatty liver 03/18/2009    Priority: Medium  . Hyperlipidemia 07/26/2007    Priority: Medium  . ANEMIA, B12 DEFICIENCY 12/29/2006    Priority: Medium  . RESTLESS LEG SYNDROME, MILD 12/28/2006    Priority: Medium  . OSTEOPOROSIS 12/12/2006    Priority: Medium  . Acute diverticulitis 10/05/2015    Priority: Low  . CKD (chronic kidney disease), stage II 01/22/2014    Priority: Low  . Posterior tibial tendon dysfunction 01/03/2014    Priority: Low  . Osteoarthritis, knee 01/03/2014    Priority: Low  . Glaucoma 01/03/2014    Priority: Low  . Obesity (BMI 30-39.9) 06/15/2013    Priority: Low  . Foot tendinitis 07/20/2010    Priority: Low  . INSOMNIA, CHRONIC 07/25/2009    Priority: Low  . GERD 12/25/2007    Priority: Low  . NEUROPATHY, IDIOPATHIC PERIPHERAL NEC 12/28/2006    Priority: Low  . BREAST CANCER, HX OF 12/12/2006    Priority: Low  . Arthritis of midfoot 03/03/2016  . Hammertoes of both feet 03/03/2016  . OA (osteoarthritis) of knee 12/30/2014    Medications- reviewed  and updated Current Outpatient Prescriptions  Medication Sig Dispense Refill  . ALPRAZolam (XANAX) 0.25 MG tablet Take 0.25 mg by mouth daily as needed.    [name] atenolol (TENORMIN) 25 MG tablet Take 1 tablet (25 mg total) by mouth daily. 90 tablet 3  . budesonide-formoterol (SYMBICORT) 160-4.5 MCG/ACT inhaler Inhale 2 puffs into the lungs 2 (two) times daily.    [name] dicyclomine (BENTYL) 10 MG capsule Take 2 capsules (20 mg total) by mouth 4 (four) times daily -  before meals and at bedtime. 240 capsule 3  . Difenoxin-Atropine (MOTOFEN) 1-0.025 MG TABS Take 1 tablet by mouth every 4 (four) hours. 120 each 0  . dorzolamide-timolol (COSOPT) 22.3-6.8 MG/ML ophthalmic solution Place 1 drop into both eyes 2 (two) times daily.     [name] ESTRING 2 MG vaginal ring 1 ring pv every 3 months 1 each 4  . FLUoxetine (PROZAC) 40 MG capsule Take 40 mg by mouth daily.    [name] glimepiride (AMARYL) 4 MG tablet TAKE ONE TABLET BY MOUTH ONCE DAILY WITH BREAKFAST (Patient taking differently: Take 4 mg by mouth daily with breakfast. TAKE ONE TABLET BY MOUTH ONCE DAILY WITH BREAKFAST) 90 tablet 3  . glucose blood (FREESTYLE LITE) test strip USE TO CHECK BLOOD SUGAR DAILY AND AS NEEDED 100 each 5  . lisinopril (PRINIVIL,ZESTRIL) 10 MG tablet Take 1 tablet (10 mg total) by mouth daily. (Patient taking differently: Take 10 mg by mouth every  evening. ) 90 tablet 3  . LUMIGAN 0.01 % SOLN Place 1 drop into both eyes at bedtime.     . pramipexole (MIRAPEX) 0.5 MG tablet Take 1 tablet (0.5 mg total) by mouth at bedtime. 90 tablet 1  . Respiratory Therapy Supplies (FLUTTER) DEVI Use as directed 1 each 0  . temazepam (RESTORIL) 30 MG capsule Take 1 capsule (30 mg total) by mouth at bedtime as needed. for sleep (Patient taking differently: Take 30 mg by mouth at bedtime. for sleep) 30 capsule 5  . VICTOZA 18 MG/3ML SOPN INJECT 1.8MG  INTO THE SKIN DAILY 3 mL 6  . budesonide-formoterol (SYMBICORT) 160-4.5 MCG/ACT inhaler Inhale 2 puffs into the  lungs 2 (two) times daily. 1 Inhaler 5   No current facility-administered medications for this visit.     Objective: BP 118/62 (BP Location: Left Arm, Patient Position: Sitting, Cuff Size: Large)   Pulse 88   Temp 98.8 F (37.1 C) (Oral)   Ht 6' (1.829 m)   Wt 251 lb 3.2 oz (113.9 kg)   LMP 05/10/1992   SpO2 94%   BMI 34.07 kg/m  Gen: NAD, resting comfortably CV: RRR no murmurs rubs or gallops Lungs: CTAB no crackles, wheeze, rhonchi Ext: no edema Skin: warm, dry Msk: well healed left knee scar from total knee replacement  Assessment/Plan:  IBS issues emotionally exhausting for her. She has been sedentary since knee surgery. Trainer as next step, online PT- may need to go to formal PT. Also see avs  b12 deficiency- injection today- last before July then to return to [facility] for monthly.   Depression, major, single episode, complete remission ([name]) S: PHQ9 last visit was down to 9, Today improved further to 5 and if not for GI issues she states would be even better. She did not want to change regimen due to drastic improvement at last visit- thrilled she has improved even more. She was compliant with lexapro 20mg  at that time. We talked about avoiding napping to encourage better sleep in the evening. Uses temazepam for sleep A/P: she feels much improved, continue current medications. PHQ9 at goal of 5 or less   Essential hypertension, benign S: controlled on  atenolol 25mg , lisinopril 10mg  BP Readings from Last 3 Encounters:  02/01/17 118/62  01/31/17 114/62  01/26/17 106/62  A/P: We discussed blood pressure goal of <140/90. Continue current meds  Diabetes mellitus type II, controlled ([name]) S: well controlled. On victoza 1.8mg , amaryl 4mg  Lab Results  Component Value Date   HGBA1C 6.1 02/01/2017   HGBA1C 6.9 (H) 10/21/2016   HGBA1C 6.3 07/23/2016   A/P: improved from last visit- continue current meds  4 -6 months reasonable due to well controlled DM though did not set  specific follow up dates  Future Appointments Date Time Provider [place]  04/05/2017 3:00 PM Mauri Pole, MD [facility]  04/14/2017 2:45 PM Megan Salon, MD [place] None    Orders Placed This Encounter  Procedures  . CBC with Differential/Platelet  . Comprehensive metabolic panel    [place]  . Hemoglobin A1c    [place]    Meds ordered this encounter  Medications  . cyanocobalamin ((VITAMIN B-12)) injection 1,000 mcg    Return precautions advised.  [name], MD

## 2017-02-02 NOTE — Assessment & Plan Note (Signed)
S: PHQ9 last visit was down to 9, Today improved further to 5 and if not for GI issues she states would be even better. She did not want to change regimen due to drastic improvement at last visit- thrilled she has improved even more. She was compliant with lexapro 20mg  at that time. We talked about avoiding napping to encourage better sleep in the evening. Uses temazepam for sleep A/P: she feels much improved, continue current medications. PHQ9 at goal of 5 or less

## 2017-02-02 NOTE — Assessment & Plan Note (Signed)
S: well controlled. On victoza 1.8mg , amaryl 4mg  Lab Results  Component Value Date   HGBA1C 6.1 02/01/2017   HGBA1C 6.9 (H) 10/21/2016   HGBA1C 6.3 07/23/2016   A/P: improved from last visit- continue current meds

## 2017-02-02 NOTE — Assessment & Plan Note (Signed)
S: controlled on  atenolol 25mg , lisinopril 10mg  BP Readings from Last 3 Encounters:  02/01/17 118/62  01/31/17 114/62  01/26/17 106/62  A/P: We discussed blood pressure goal of <140/90. Continue current meds

## 2017-02-03 ENCOUNTER — Other Ambulatory Visit: Payer: Self-pay | Admitting: Family Medicine

## 2017-02-14 DIAGNOSIS — R2681 Unsteadiness on feet: Secondary | ICD-10-CM | POA: Diagnosis not present

## 2017-02-14 DIAGNOSIS — M6281 Muscle weakness (generalized): Secondary | ICD-10-CM | POA: Diagnosis not present

## 2017-02-14 DIAGNOSIS — M47896 Other spondylosis, lumbar region: Secondary | ICD-10-CM | POA: Diagnosis not present

## 2017-02-15 DIAGNOSIS — M1712 Unilateral primary osteoarthritis, left knee: Secondary | ICD-10-CM | POA: Diagnosis not present

## 2017-02-20 ENCOUNTER — Other Ambulatory Visit: Payer: Self-pay | Admitting: Family Medicine

## 2017-03-07 ENCOUNTER — Encounter: Payer: Self-pay | Admitting: Family Medicine

## 2017-03-08 DIAGNOSIS — M47896 Other spondylosis, lumbar region: Secondary | ICD-10-CM | POA: Diagnosis not present

## 2017-03-08 DIAGNOSIS — M6281 Muscle weakness (generalized): Secondary | ICD-10-CM | POA: Diagnosis not present

## 2017-03-08 DIAGNOSIS — R2681 Unsteadiness on feet: Secondary | ICD-10-CM | POA: Diagnosis not present

## 2017-03-13 ENCOUNTER — Other Ambulatory Visit: Payer: Self-pay | Admitting: Pulmonary Disease

## 2017-03-14 DIAGNOSIS — M6281 Muscle weakness (generalized): Secondary | ICD-10-CM | POA: Diagnosis not present

## 2017-03-14 DIAGNOSIS — R2681 Unsteadiness on feet: Secondary | ICD-10-CM | POA: Diagnosis not present

## 2017-03-14 DIAGNOSIS — M47896 Other spondylosis, lumbar region: Secondary | ICD-10-CM | POA: Diagnosis not present

## 2017-03-15 ENCOUNTER — Other Ambulatory Visit: Payer: Self-pay | Admitting: Family Medicine

## 2017-03-15 DIAGNOSIS — Z794 Long term (current) use of insulin: Principal | ICD-10-CM

## 2017-03-15 DIAGNOSIS — E119 Type 2 diabetes mellitus without complications: Secondary | ICD-10-CM

## 2017-03-16 DIAGNOSIS — M6281 Muscle weakness (generalized): Secondary | ICD-10-CM | POA: Diagnosis not present

## 2017-03-16 DIAGNOSIS — M47896 Other spondylosis, lumbar region: Secondary | ICD-10-CM | POA: Diagnosis not present

## 2017-03-16 DIAGNOSIS — R2681 Unsteadiness on feet: Secondary | ICD-10-CM | POA: Diagnosis not present

## 2017-03-17 DIAGNOSIS — D1801 Hemangioma of skin and subcutaneous tissue: Secondary | ICD-10-CM | POA: Diagnosis not present

## 2017-03-17 DIAGNOSIS — L821 Other seborrheic keratosis: Secondary | ICD-10-CM | POA: Diagnosis not present

## 2017-03-17 DIAGNOSIS — L814 Other melanin hyperpigmentation: Secondary | ICD-10-CM | POA: Diagnosis not present

## 2017-03-22 ENCOUNTER — Other Ambulatory Visit: Payer: Self-pay | Admitting: Family Medicine

## 2017-03-28 DIAGNOSIS — M47896 Other spondylosis, lumbar region: Secondary | ICD-10-CM | POA: Diagnosis not present

## 2017-03-28 DIAGNOSIS — R2681 Unsteadiness on feet: Secondary | ICD-10-CM | POA: Diagnosis not present

## 2017-03-28 DIAGNOSIS — M6281 Muscle weakness (generalized): Secondary | ICD-10-CM | POA: Diagnosis not present

## 2017-04-04 DIAGNOSIS — M47896 Other spondylosis, lumbar region: Secondary | ICD-10-CM | POA: Diagnosis not present

## 2017-04-04 DIAGNOSIS — R2681 Unsteadiness on feet: Secondary | ICD-10-CM | POA: Diagnosis not present

## 2017-04-04 DIAGNOSIS — M6281 Muscle weakness (generalized): Secondary | ICD-10-CM | POA: Diagnosis not present

## 2017-04-05 ENCOUNTER — Ambulatory Visit (INDEPENDENT_AMBULATORY_CARE_PROVIDER_SITE_OTHER): Payer: Medicare Other | Admitting: Gastroenterology

## 2017-04-05 ENCOUNTER — Encounter: Payer: Self-pay | Admitting: Gastroenterology

## 2017-04-05 VITALS — BP 144/82 | HR 72 | Ht 72.0 in | Wt 243.6 lb

## 2017-04-05 DIAGNOSIS — R197 Diarrhea, unspecified: Secondary | ICD-10-CM

## 2017-04-05 MED ORDER — ALPRAZOLAM 0.25 MG PO TABS: 0.2500 mg | ORAL_TABLET | Freq: Every day | ORAL | 0 refills | Status: DC

## 2017-04-05 NOTE — Patient Instructions (Signed)
You will be contacted by Bed Placement for Colonoscopy prep 1 day prior to your procedure, Be expecting a call from [facility] on 05/16/2017 before noon. Their number is [phone] option 2 for [place] if you have any questions that day.  Your procedure Colonoscopy has been scheduled on 05-17-2017 at 8:30am   We have given you a printed prescription of Xanax today with 0 refills

## 2017-04-05 NOTE — Progress Notes (Signed)
[name]    [id number]    12-05-1940  Primary Care Physician:Hunter, Brayton Mars, MD  Referring Physician: Marin Olp, MD [place], [place] [address]  Chief complaint:  Diarrhea  HPI: 76 year old female here for follow-up visit.  She was last seen by Earnie Larsson on January 26, 2017.  She continues to have persistent diarrhea despite taking Lomotil, she is having 3-4 watery bowel movements, only thing that seems to improve is Imodium.  Her symptoms are worse since she had knee surgery.  She has not had a formed bowel movement since then.  Last colonoscopy February 2015 with diverticulosis and hemorrhoids.  She is taking dicyclomine 20 mg every 6 hours with some improvement of abdominal cramps.  She is afraid to eat or go anywhere outside the house.  She started eating coconut cookies because she read somewhere in the magazine column that coconut macron's can improve IBS symptoms, no significant improvement. GI pathogen panel, O&P negative. Denies any nausea, vomiting, abdominal pain, melena or bright red blood per rectum      Outpatient Encounter Medications as of 04/05/2017  Medication Sig  . ALPRAZolam (XANAX) 0.25 MG tablet Take 0.25 mg by mouth daily as needed.  [name] atenolol (TENORMIN) 25 MG tablet TAKE ONE TABLET BY MOUTH ONCE DAILY  . budesonide-formoterol (SYMBICORT) 160-4.5 MCG/ACT inhaler Inhale 2 puffs into the lungs 2 (two) times daily.  . Difenoxin-Atropine (MOTOFEN) 1-0.025 MG TABS Take 1 tablet by mouth every 4 (four) hours.  . dorzolamide-timolol (COSOPT) 22.3-6.8 MG/ML ophthalmic solution Place 1 drop into both eyes 2 (two) times daily.   [name] ESTRING 2 MG vaginal ring 1 ring pv every 3 months  . FLUoxetine (PROZAC) 40 MG capsule Take 40 mg by mouth daily.  [name] glimepiride (AMARYL) 4 MG tablet TAKE ONE TABLET BY MOUTH ONCE DAILY WITH BREAKFAST (Patient taking differently: Take 4 mg by mouth daily with breakfast. TAKE ONE TABLET BY  MOUTH ONCE DAILY WITH BREAKFAST)  . glucose blood (FREESTYLE LITE) test strip USE TO CHECK BLOOD SUGAR DAILY AND AS NEEDED  . lisinopril (PRINIVIL,ZESTRIL) 10 MG tablet Take 1 tablet (10 mg total) by mouth daily. (Patient taking differently: Take 10 mg by mouth every evening. )  . LUMIGAN 0.01 % SOLN Place 1 drop into both eyes at bedtime.   . pramipexole (MIRAPEX) 0.5 MG tablet Take 1 tablet (0.5 mg total) by mouth at bedtime.  [name] Respiratory Therapy Supplies (FLUTTER) DEVI Use as directed  . temazepam (RESTORIL) 30 MG capsule TAKE 1 CAPSULE BY MOUTH AT BEDTIME AS NEEDED FOR SLEEP  . VICTOZA 18 MG/3ML SOPN INJECT 1.8MG  INTO THE SKIN DAILY  . dicyclomine (BENTYL) 10 MG capsule Take 2 capsules (20 mg total) by mouth 4 (four) times daily -  before meals and at bedtime.  . [DISCONTINUED] lisinopril (PRINIVIL,ZESTRIL) 10 MG tablet TAKE ONE TABLET BY MOUTH ONCE DAILY  . [DISCONTINUED] SYMBICORT 160-4.5 MCG/ACT inhaler INHALE TWO PUFFS BY MOUTH TWICE DAILY   No facility-administered encounter medications on file as of 04/05/2017.     Allergies as of 04/05/2017 - Review Complete 04/05/2017  Allergen Reaction Noted  . Cephalexin Diarrhea 10/14/2006  . Erythromycin ethylsuccinate Hives and Diarrhea 10/14/2006  . Levaquin [levofloxacin in d5w] Other (See Comments) 12/18/2014  . Nitrofurantoin Other (See Comments) 10/14/2006  . Percocet [oxycodone-acetaminophen] Itching 12/31/2011    Past Medical History:  Diagnosis Date  . Arthritis    right knee;injections  every 39months   . Asthma   . Bronchiectasis ([place])   . Cancer (HCC)    HX BREAST CANCER  . COMPRESSION FRACTURE, LUMBAR VERTEBRAE 08/21/2008   Qualifier: Diagnosis of  By: Arnoldo Morale MD, Balinda Quails   . Depression   . Diabetes mellitus    takes Amaryl and Januvia daily  . Difficulty sleeping   . Diverticulitis   . Early cataracts, bilateral   . Eczema   . Endometriosis   . Gastritis   . Glaucoma   . Hammer toe   . Headache(784.0)     Migraines  . Hypertension    takes Atenolol daily  . IBS (irritable bowel syndrome)   . Joint pain   . Kidney stone   . Neuropathy   . Open wound of second toe of left foot    at pre-op appt, wound appears clear of infection 1 month post removal of toenail, no obvious exudate present nor any redness,  . Osteomyelitis ([place])    left 2nd toe  . Osteopenia   . PONV (postoperative nausea and vomiting)   . Posterior tibial tendon dysfunction    left foot  . Scoliosis   . Severe esophageal dysplasia   . 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 Vitamin D deficiency    takes Vit d daily    Past Surgical History:  Procedure Laterality Date  . ADENOIDECTOMY     at age 46  . biopsy on back     benign  . CATARACT EXTRACTION    . CHOLECYSTECTOMY  06/2008  . COLONOSCOPY  11/02/05  . DILATION AND CURETTAGE OF UTERUS    . ENDOMETRIAL BIOPSY  06/22/2011   benign polyp, no hyperplasia  . excision on breast     internal infected suture from breast surgery  . excision removed from neck     infected lymph node  . FOOT SURGERY     left foot-cleaning out areas on toes at the wound center-having MRI to determine if osteomyelitis  . HYSTEROSCOPY  06/22/11   PMB submucosal myoma  . JOINT REPLACEMENT     left total shoulder  . LAPAROSCOPIC OOPHORECTOMY Right 12/2004   absent LSO  . LAPAROTOMY    . MASTECTOMY Bilateral 1994 right, 1995 left  . SHOULDER HEMI-ARTHROPLASTY  01/01/2012   Procedure: SHOULDER HEMI-ARTHROPLASTY;  Surgeon: Roseanne Kaufman, MD;  Location: [facility];  Service: Orthopedics;  Laterality: Left;  Left Shoulder Hemi Arthroplasty with Repair and Reconstruction as Necessary   . THYROIDECTOMY     34yrs ago. follows endocrine  . TONSILLECTOMY    . TOTAL KNEE ARTHROPLASTY Right 12/30/2014   Procedure: RIGHT TOTAL KNEE ARTHROPLASTY;  Surgeon: Gaynelle Arabian, MD;  Location: [facility];  Service: Orthopedics;  Laterality: Right;  . TOTAL KNEE ARTHROPLASTY Left  11/29/2016   Procedure: LEFT TOTAL KNEE ARTHROPLASTY;  Surgeon: Gaynelle Arabian, MD;  Location: [facility];  Service: Orthopedics;  Laterality: Left;    Family History  Problem Relation Age of Onset  . Arthritis Mother   . COPD Father   . Heart disease Father        MI 36  . Hypertension Father   . Hyperlipidemia Father   . Colon cancer Neg Hx   . Esophageal cancer Neg Hx   . Rectal cancer Neg Hx   . Stomach cancer Neg Hx     Social History   Socioeconomic History  . Marital status: Married  Spouse name: Not on file  . Number of children: Not on file  . Years of education: Not on file  . Highest education level: Not on file  Social Needs  . Financial resource strain: Not on file  . Food insecurity - worry: Not on file  . Food insecurity - inability: Not on file  . Transportation needs - medical: Not on file  . Transportation needs - non-medical: Not on file  Occupational History  . Not on file  Tobacco Use  . Smoking status: Never Smoker  . Smokeless tobacco: Never Used  Substance and Sexual Activity  . Alcohol use: No  . Drug use: No  . Sexual activity: Not on file  Other Topics Concern  . Not on file  Social History Narrative   Married 39 years in 2015. 1 adopted son. 1 grandkid (78 yo grandson)      Retired from Development worker, international aid at [employer]: fashion, tv shopping, sewing, decorate      Review of systems: Review of Systems  Constitutional: Negative for fever and chills. Positive for lack of energy HENT: Negative.   Eyes: Negative for blurred vision.  Respiratory: Negative for cough, shortness of breath and positive for wheezing.   Cardiovascular: Negative for chest pain and palpitations.  Gastrointestinal: as per HPI Genitourinary: Negative for dysuria, urgency, frequency and hematuria.  Musculoskeletal: Negative for myalgias, back pain and joint pain.  Skin: Negative for itching and rash.  Neurological: Negative for dizziness, tremors, focal weakness,  seizures and loss of consciousness.  Endo/Heme/Allergies: Positive for seasonal allergies.  Psychiatric/Behavioral: Negative for depression, suicidal ideas and hallucinations. Positive for anxiety All other systems reviewed and are negative.   Physical Exam: Vitals:   04/05/17 1450  BP: (!) 144/82  Pulse: 72   Body mass index is 33.04 kg/m. Gen:      No acute distress HEENT:  EOMI, sclera anicteric Neck:     No masses; no thyromegaly Lungs:    Clear to auscultation bilaterally; normal respiratory effort CV:         Regular rate and rhythm; no murmurs Abd:      + bowel sounds; soft, non-tender; no palpable masses, no distension Ext:    No edema; adequate peripheral perfusion Skin:      Warm and dry; no rash Neuro: alert and oriented x 3 Psych: normal mood and affect  Data Reviewed:  Reviewed labs, radiology imaging, old records and pertinent past GI work up   Assessment and Plan/Recommendations:  76 year old female with complaints of persistent chronic diarrhea We will schedule for colonoscopy to evaluate, obtain biopsies to exclude microscopic colitis given no improvement Patient did not tolerate cholestyramine, no improvement with Lomotil or dicyclomine She did not tolerate low fod map diet Patient has limited mobility status post knee surgery, she has steps in her apartment, was concerned if she will be able to do the prep at home.  Will admit patient to the hospital day prior to the colonoscopy to complete the bowel prep  The risks and benefits as well as alternatives of endoscopic procedure(s) have been discussed and reviewed. All questions answered. The patient agrees to proceed.    Damaris Hippo , MD [phone] Mon-Fri 8a-5p [phone] after 5p, weekends, holidays  CC: Marin Olp, MD

## 2017-04-07 DIAGNOSIS — M47896 Other spondylosis, lumbar region: Secondary | ICD-10-CM | POA: Diagnosis not present

## 2017-04-07 DIAGNOSIS — R2681 Unsteadiness on feet: Secondary | ICD-10-CM | POA: Diagnosis not present

## 2017-04-07 DIAGNOSIS — M6281 Muscle weakness (generalized): Secondary | ICD-10-CM | POA: Diagnosis not present

## 2017-04-13 DIAGNOSIS — E1151 Type 2 diabetes mellitus with diabetic peripheral angiopathy without gangrene: Secondary | ICD-10-CM | POA: Diagnosis not present

## 2017-04-13 DIAGNOSIS — L84 Corns and callosities: Secondary | ICD-10-CM | POA: Diagnosis not present

## 2017-04-13 DIAGNOSIS — L603 Nail dystrophy: Secondary | ICD-10-CM | POA: Diagnosis not present

## 2017-04-13 DIAGNOSIS — I739 Peripheral vascular disease, unspecified: Secondary | ICD-10-CM | POA: Diagnosis not present

## 2017-04-14 ENCOUNTER — Ambulatory Visit (INDEPENDENT_AMBULATORY_CARE_PROVIDER_SITE_OTHER): Payer: Medicare Other | Admitting: Obstetrics & Gynecology

## 2017-04-14 ENCOUNTER — Encounter: Payer: Self-pay | Admitting: Obstetrics & Gynecology

## 2017-04-14 VITALS — BP 110/60 | HR 80 | Resp 16 | Ht 72.0 in | Wt 245.0 lb

## 2017-04-14 DIAGNOSIS — N898 Other specified noninflammatory disorders of vagina: Secondary | ICD-10-CM | POA: Diagnosis not present

## 2017-04-14 DIAGNOSIS — N760 Acute vaginitis: Secondary | ICD-10-CM

## 2017-04-14 DIAGNOSIS — N952 Postmenopausal atrophic vaginitis: Secondary | ICD-10-CM | POA: Diagnosis not present

## 2017-04-14 MED ORDER — FLUCONAZOLE 150 MG PO TABS: 150.0000 mg | ORAL_TABLET | Freq: Once | ORAL | 0 refills | Status: AC

## 2017-04-14 NOTE — Progress Notes (Signed)
GYNECOLOGY  VISIT  CC:   Estring removal  HPI: 76 y.o. G0P0 Married Caucasian female here for estring change.  Having issues with her back.  Doing physical therapy.  This has occurred since knee surgery.  Still using cane for walking.  Reports she is having some vaginal discharge.  No odor.  No vaginal bleeding.  No back pain.  No changes in bowel or bladder function.  Has not taken anything for this.  GYNECOLOGIC HISTORY: Patient's last menstrual period was 05/10/1992. Contraception: post menopausal  Menopausal hormone therapy: estring   Patient Active Problem List   Diagnosis Date Noted  . Arthritis of midfoot 03/03/2016  . Hammertoes of both feet 03/03/2016  . Aortic atherosclerosis ([place]) 02/16/2016  . Solitary pulmonary nodule 01/08/2016  . 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  . Bronchiectasis without acute exacerbation ([place]) 10/18/2015  . Acute diverticulitis 10/05/2015  . OA (osteoarthritis) of knee 12/30/2014  . IBS (irritable bowel syndrome) 07/17/2014  . CKD (chronic kidney disease), stage II 01/22/2014  . Depression, major, single episode, complete remission ([place]) 01/03/2014  . Posterior tibial tendon dysfunction 01/03/2014  . Osteoarthritis, knee 01/03/2014  . Glaucoma 01/03/2014  . Essential hypertension, benign 01/03/2014  . Obesity (BMI 30-39.9) 06/15/2013  . Foot tendinitis 07/20/2010  . INSOMNIA, CHRONIC 07/25/2009  . Fatty liver 03/18/2009  . GERD 12/25/2007  . Hyperlipidemia 07/26/2007  . ANEMIA, B12 DEFICIENCY 12/29/2006  . Diabetes mellitus type II, controlled ([place]) 12/28/2006  . RESTLESS LEG SYNDROME, MILD 12/28/2006  . NEUROPATHY, IDIOPATHIC PERIPHERAL NEC 12/28/2006  . OSTEOPOROSIS 12/12/2006  . BREAST CANCER, HX OF 12/12/2006    Past Medical History:  Diagnosis Date  . Arthritis    right knee;injections every 10months   . Asthma   . Bronchiectasis ([place])   . Cancer (HCC)    HX BREAST CANCER  . COMPRESSION FRACTURE, LUMBAR VERTEBRAE 08/21/2008    Qualifier: Diagnosis of  By: Arnoldo Morale MD, Balinda Quails   . Depression   . Diabetes mellitus    takes Amaryl and Januvia daily  . Difficulty sleeping   . Diverticulitis   . Early cataracts, bilateral   . Eczema   . Endometriosis   . Gastritis   . Glaucoma   . Hammer toe   . Headache(784.0)    Migraines  . Hypertension    takes Atenolol daily  . IBS (irritable bowel syndrome)   . Joint pain   . Kidney stone   . Neuropathy   . Open wound of second toe of left foot    at pre-op appt, wound appears clear of infection 1 month post removal of toenail, no obvious exudate present nor any redness,  . Osteomyelitis ([place])    left 2nd toe  . Osteopenia   . PONV (postoperative nausea and vomiting)   . Posterior tibial tendon dysfunction    left foot  . Scoliosis   . Severe esophageal dysplasia   . 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 Vitamin D deficiency    takes Vit d daily    Past Surgical History:  Procedure Laterality Date  . ADENOIDECTOMY     at age 89  . biopsy on back     benign  . CATARACT EXTRACTION    . CHOLECYSTECTOMY  06/2008  . COLONOSCOPY  11/02/05  . DILATION AND CURETTAGE OF UTERUS    . ENDOMETRIAL BIOPSY  06/22/2011   benign polyp, no hyperplasia  . excision on breast  internal infected suture from breast surgery  . excision removed from neck     infected lymph node  . FOOT SURGERY     left foot-cleaning out areas on toes at the wound center-having MRI to determine if osteomyelitis  . HYSTEROSCOPY  06/22/11   PMB submucosal myoma  . JOINT REPLACEMENT     left total shoulder  . LAPAROSCOPIC OOPHORECTOMY Right 12/2004   absent LSO  . LAPAROTOMY    . MASTECTOMY Bilateral 1994 right, 1995 left  . SHOULDER HEMI-ARTHROPLASTY  01/01/2012   Procedure: SHOULDER HEMI-ARTHROPLASTY;  Surgeon: Roseanne Kaufman, MD;  Location: [facility];  Service: Orthopedics;  Laterality: Left;  Left Shoulder Hemi Arthroplasty with Repair and Reconstruction  as Necessary   . THYROIDECTOMY     9yrs ago. follows endocrine  . TONSILLECTOMY    . TOTAL KNEE ARTHROPLASTY Right 12/30/2014   Procedure: RIGHT TOTAL KNEE ARTHROPLASTY;  Surgeon: Gaynelle Arabian, MD;  Location: [facility];  Service: Orthopedics;  Laterality: Right;  . TOTAL KNEE ARTHROPLASTY Left 11/29/2016   Procedure: LEFT TOTAL KNEE ARTHROPLASTY;  Surgeon: Gaynelle Arabian, MD;  [facility];  Service: Orthopedics;  Laterality: Left;    MEDS:   Current Outpatient Medications on File Prior to Visit  Medication Sig Dispense Refill  . ALPRAZolam (XANAX) 0.25 MG tablet Take 0.25 mg by mouth daily as needed.    [name] atenolol (TENORMIN) 25 MG tablet TAKE ONE TABLET BY MOUTH ONCE DAILY 90 tablet 3  . budesonide-formoterol (SYMBICORT) 160-4.5 MCG/ACT inhaler Inhale 2 puffs into the lungs 2 (two) times daily.    . dorzolamide-timolol (COSOPT) 22.3-6.8 MG/ML ophthalmic solution Place 1 drop into both eyes 2 (two) times daily.     [name] ESTRING 2 MG vaginal ring 1 ring pv every 3 months 1 each 4  . FLUoxetine (PROZAC) 40 MG capsule Take 40 mg by mouth daily.    [name] glimepiride (AMARYL) 4 MG tablet TAKE ONE TABLET BY MOUTH ONCE DAILY WITH BREAKFAST (Patient taking differently: Take 4 mg by mouth daily with breakfast. TAKE ONE TABLET BY MOUTH ONCE DAILY WITH BREAKFAST) 90 tablet 3  . glucose blood (FREESTYLE LITE) test strip USE TO CHECK BLOOD SUGAR DAILY AND AS NEEDED 50 each 11  . lisinopril (PRINIVIL,ZESTRIL) 10 MG tablet Take 1 tablet (10 mg total) by mouth daily. (Patient taking differently: Take 10 mg by mouth every evening. ) 90 tablet 3  . LUMIGAN 0.01 % SOLN Place 1 drop into both eyes at bedtime.     . pramipexole (MIRAPEX) 0.5 MG tablet Take 1 tablet (0.5 mg total) by mouth at bedtime. 90 tablet 1  . Respiratory Therapy Supplies (FLUTTER) DEVI Use as directed 1 each 0  . temazepam (RESTORIL) 30 MG capsule TAKE 1 CAPSULE BY MOUTH AT BEDTIME AS NEEDED FOR SLEEP 30 capsule 5  . VICTOZA 18 MG/3ML SOPN  INJECT 1.8MG  INTO THE SKIN DAILY 3 mL 6  . dicyclomine (BENTYL) 10 MG capsule Take 2 capsules (20 mg total) by mouth 4 (four) times daily -  before meals and at bedtime. 240 capsule 3   No current facility-administered medications on file prior to visit.     ALLERGIES: Cephalexin; Erythromycin ethylsuccinate; Levaquin [levofloxacin in d5w]; Nitrofurantoin; and Percocet [oxycodone-acetaminophen]  Family History  Problem Relation Age of Onset  . Arthritis Mother   . COPD Father   . Heart disease Father        MI 67  . Hypertension Father   . Hyperlipidemia Father   .  Colon cancer Neg Hx   . Esophageal cancer Neg Hx   . Rectal cancer Neg Hx   . Stomach cancer Neg Hx     SH:  Married, non smoker  Review of Systems  All other systems reviewed and are negative.   PHYSICAL EXAMINATION:    BP 110/60 (BP Location: Right Arm, Patient Position: Sitting, Cuff Size: Large)   Pulse 80   Resp 16   Ht 6' (1.829 m)   Wt 245 lb (111.1 kg)   LMP 05/10/1992   BMI 33.23 kg/m     General appearance: alert, cooperative and appears stated age Inguinal:  No LAD  Pelvic: External genitalia:  no lesions              Urethra:  normal appearing urethra with no masses, tenderness or lesions              Bartholins and Skenes: normal                 Vagina: normal appearing vagina with normal color and whitish adherent discharg, no lesions              Cervix: no lesions              Bimanual Exam:  Uterus:  normal size, contour, position, consistency, mobility, non-tender              Estring was removed without difficulty.  New Estring replaced.  Chaperone was present for exam.  Assessment: Vaginal discharge Recurrent yeast vaginitis Vaginal atrophic changes, estring use.  Inability to remove on her own. Type 2 Diabetes  Plan: New Estring placed today.  Follow up 3 months. Diflucan 150mg  po x 1 repeat 72 hours if needed.

## 2017-04-17 ENCOUNTER — Other Ambulatory Visit: Payer: Self-pay | Admitting: Family Medicine

## 2017-04-20 NOTE — Telephone Encounter (Signed)
Per OV note dated 07/23/16  we opted to change from prozac 40mg  to lexapro 20mg 

## 2017-04-23 ENCOUNTER — Encounter: Payer: Self-pay | Admitting: Family Medicine

## 2017-04-25 ENCOUNTER — Other Ambulatory Visit: Payer: Self-pay

## 2017-04-25 MED ORDER — FLUOXETINE HCL 40 MG PO CAPS: 40.0000 mg | ORAL_CAPSULE | Freq: Every day | ORAL | 3 refills | Status: DC

## 2017-05-01 ENCOUNTER — Other Ambulatory Visit: Payer: Self-pay | Admitting: Family Medicine

## 2017-05-09 ENCOUNTER — Telehealth: Payer: Self-pay | Admitting: Gastroenterology

## 2017-05-09 NOTE — Telephone Encounter (Signed)
Spouse here at the office. The patient has decided she can prep at home. Does not want to be in the hospital due to flu. Appointment made for pre-visit. Hospital bed placement contacted and instructed to cancel.

## 2017-05-09 NOTE — Telephone Encounter (Deleted)
DPR on file. Message left on her voicemail.

## 2017-05-09 NOTE — Telephone Encounter (Signed)
Left a message to call. Patient is a pre-admit for colonoscopy prep. Colonoscopy is scheduled 05/17/17. She should be admitted on 05/16/17.

## 2017-05-11 ENCOUNTER — Ambulatory Visit (AMBULATORY_SURGERY_CENTER): Payer: Self-pay

## 2017-05-11 ENCOUNTER — Other Ambulatory Visit: Payer: Self-pay

## 2017-05-11 VITALS — Ht 72.0 in | Wt 246.6 lb

## 2017-05-11 DIAGNOSIS — Z86010 Personal history of colonic polyps: Secondary | ICD-10-CM

## 2017-05-11 MED ORDER — NA SULFATE-K SULFATE-MG SULF 17.5-3.13-1.6 GM/177ML PO SOLN: 1.0000 | Freq: Once | ORAL | 0 refills | Status: AC

## 2017-05-11 NOTE — Progress Notes (Signed)
Denies allergies to eggs or soy products. Denies complication of anesthesia or sedation. Denies use of weight loss medication. Denies use of O2.   Emmi instructions declined.  

## 2017-05-15 ENCOUNTER — Other Ambulatory Visit: Payer: Self-pay | Admitting: Family Medicine

## 2017-05-16 NOTE — Anesthesia Preprocedure Evaluation (Addendum)
Anesthesia Evaluation  Patient identified by MRN, date of birth, ID band Patient awake    Reviewed: Allergy & Precautions, NPO status , Patient's Chart, lab work & pertinent test results  Airway Mallampati: I  TM Distance: >3 FB Neck ROM: Full    Dental no notable dental hx. (+) Teeth Intact   Pulmonary neg pulmonary ROS, asthma ,    Pulmonary exam normal breath sounds clear to auscultation       Cardiovascular hypertension, negative cardio ROS Normal cardiovascular exam Rhythm:Regular Rate:Normal     Neuro/Psych negative neurological ROS  negative psych ROS   GI/Hepatic negative GI ROS, Neg liver ROS, GERD  ,  Endo/Other  negative endocrine ROSdiabetes, Type 2  Renal/GU negative Renal ROS  negative genitourinary   Musculoskeletal negative musculoskeletal ROS (+)   Abdominal   Peds negative pediatric ROS (+)  Hematology negative hematology ROS (+) anemia , Fe Deficiency   Anesthesia Other Findings   Reproductive/Obstetrics negative OB ROS                            Anesthesia Physical Anesthesia Plan  ASA: III  Anesthesia Plan: MAC   Post-op Pain Management:    Induction: Intravenous  PONV Risk Score and Plan: 3 and Treatment may vary due to age or medical condition  Airway Management Planned: Mask and Natural Airway  Additional Equipment:   Intra-op Plan:   Post-operative Plan:   Informed Consent: I have reviewed the patients History and Physical, chart, labs and discussed the procedure including the risks, benefits and alternatives for the proposed anesthesia with the patient or authorized representative who has indicated his/her understanding and acceptance.     Plan Discussed with: CRNA  Anesthesia Plan Comments:        Anesthesia Quick Evaluation

## 2017-05-17 ENCOUNTER — Ambulatory Visit (HOSPITAL_COMMUNITY)
Admission: RE | Admit: 2017-05-17 | Discharge: 2017-05-17 | Disposition: A | Discharge status: Home / Self Care | Payer: Medicare Other | Source: Ambulatory Visit | Attending: Gastroenterology | Admitting: Gastroenterology

## 2017-05-17 ENCOUNTER — Ambulatory Visit (HOSPITAL_COMMUNITY): Payer: Medicare Other | Admitting: Anesthesiology

## 2017-05-17 ENCOUNTER — Other Ambulatory Visit: Payer: Self-pay

## 2017-05-17 ENCOUNTER — Encounter (HOSPITAL_COMMUNITY): Payer: Self-pay | Admitting: *Deleted

## 2017-05-17 ENCOUNTER — Encounter (HOSPITAL_COMMUNITY)
Admission: RE | Disposition: A | Discharge status: Home / Self Care | Payer: Self-pay | Source: Ambulatory Visit | Attending: Gastroenterology

## 2017-05-17 DIAGNOSIS — Z888 Allergy status to other drugs, medicaments and biological substances status: Secondary | ICD-10-CM | POA: Insufficient documentation

## 2017-05-17 DIAGNOSIS — K580 Irritable bowel syndrome with diarrhea: Secondary | ICD-10-CM | POA: Insufficient documentation

## 2017-05-17 DIAGNOSIS — Z79899 Other long term (current) drug therapy: Secondary | ICD-10-CM | POA: Diagnosis not present

## 2017-05-17 DIAGNOSIS — F419 Anxiety disorder, unspecified: Secondary | ICD-10-CM | POA: Insufficient documentation

## 2017-05-17 DIAGNOSIS — Z7984 Long term (current) use of oral hypoglycemic drugs: Secondary | ICD-10-CM | POA: Insufficient documentation

## 2017-05-17 DIAGNOSIS — I10 Essential (primary) hypertension: Secondary | ICD-10-CM | POA: Insufficient documentation

## 2017-05-17 DIAGNOSIS — E119 Type 2 diabetes mellitus without complications: Secondary | ICD-10-CM | POA: Diagnosis not present

## 2017-05-17 DIAGNOSIS — F329 Major depressive disorder, single episode, unspecified: Secondary | ICD-10-CM | POA: Diagnosis not present

## 2017-05-17 DIAGNOSIS — K5730 Diverticulosis of large intestine without perforation or abscess without bleeding: Secondary | ICD-10-CM

## 2017-05-17 DIAGNOSIS — D122 Benign neoplasm of ascending colon: Secondary | ICD-10-CM

## 2017-05-17 DIAGNOSIS — K648 Other hemorrhoids: Secondary | ICD-10-CM | POA: Diagnosis not present

## 2017-05-17 DIAGNOSIS — K635 Polyp of colon: Secondary | ICD-10-CM | POA: Diagnosis not present

## 2017-05-17 DIAGNOSIS — J45909 Unspecified asthma, uncomplicated: Secondary | ICD-10-CM | POA: Diagnosis not present

## 2017-05-17 DIAGNOSIS — R197 Diarrhea, unspecified: Secondary | ICD-10-CM | POA: Diagnosis not present

## 2017-05-17 DIAGNOSIS — Z885 Allergy status to narcotic agent status: Secondary | ICD-10-CM | POA: Diagnosis not present

## 2017-05-17 DIAGNOSIS — Z853 Personal history of malignant neoplasm of breast: Secondary | ICD-10-CM | POA: Insufficient documentation

## 2017-05-17 HISTORY — PX: COLONOSCOPY WITH PROPOFOL: SHX5780

## 2017-05-17 LAB — GLUCOSE, CAPILLARY: GLUCOSE-CAPILLARY: 150 mg/dL — AB (ref 65–99)

## 2017-05-17 SURGERY — COLONOSCOPY WITH PROPOFOL
Anesthesia: Monitor Anesthesia Care

## 2017-05-17 MED ORDER — LACTATED RINGERS IV SOLN
INTRAVENOUS | Status: DC
  Administered 2017-05-17: 1000 mL via INTRAVENOUS

## 2017-05-17 MED ORDER — STERILE WATER FOR IRRIGATION IR SOLN
Status: DC | PRN
  Administered 2017-05-17: 7 mL

## 2017-05-17 MED ORDER — LIDOCAINE 2% (20 MG/ML) 5 ML SYRINGE
INTRAMUSCULAR | Status: DC | PRN
  Administered 2017-05-17: 60 mg via INTRAVENOUS

## 2017-05-17 MED ORDER — PROPOFOL 10 MG/ML IV BOLUS
INTRAVENOUS | Status: AC
  Filled 2017-05-17: qty 20

## 2017-05-17 MED ORDER — PROPOFOL 500 MG/50ML IV EMUL
INTRAVENOUS | Status: DC | PRN
  Administered 2017-05-17: 120 ug/kg/min via INTRAVENOUS

## 2017-05-17 MED ORDER — PROPOFOL 10 MG/ML IV BOLUS
INTRAVENOUS | Status: AC
  Filled 2017-05-17: qty 40

## 2017-05-17 MED ORDER — PROPOFOL 10 MG/ML IV BOLUS
INTRAVENOUS | Status: DC | PRN
  Administered 2017-05-17: 10 mg via INTRAVENOUS
  Administered 2017-05-17: 30 mg via INTRAVENOUS

## 2017-05-17 SURGICAL SUPPLY — 21 items

## 2017-05-17 NOTE — Anesthesia Postprocedure Evaluation (Signed)
Anesthesia Post Note  Patient: [name]  Procedure(s) Performed: COLONOSCOPY WITH PROPOFOL (N/A )     Patient location during evaluation: Endoscopy Anesthesia Type: MAC Level of consciousness: awake and alert Pain management: pain level controlled Vital Signs Assessment: post-procedure vital signs reviewed and stable Respiratory status: spontaneous breathing, nonlabored ventilation, respiratory function stable and patient connected to nasal cannula oxygen Cardiovascular status: stable and blood pressure returned to baseline Postop Assessment: no apparent nausea or vomiting Anesthetic complications: no    Last Vitals:  Vitals:   05/17/17 0738 05/17/17 0918  BP: (!) 146/53 (!) 118/51  Pulse: 83 87  Resp: 12 19  Temp: 36.8 C 36.6 C  SpO2: 94% 100%    Last Pain:  Vitals:   05/17/17 0918  TempSrc: Oral                 Barnet Glasgow

## 2017-05-17 NOTE — H&P (Signed)
[facility] History and Physical   Primary Care Physician:  Marin Olp, MD   Reason for Procedure:   Diarrhea  Plan:    Colonoscopy with biopsies The risks and benefits as well as alternatives of endoscopic procedure(s) have been discussed and reviewed. All questions answered. The patient agrees to proceed.      HPI: [name] is a 77 y.o. female with DM, obesity with chronic diarrhea . Colonoscopy to evaluate and obtain biopsies to exclude microscopic colitis.  Screening colonoscopy in 2015 showed sigmoid diverticulosis and internal hemorrhoids   Past Medical History:  Diagnosis Date  . Allergy   . Anemia   . Anxiety   . Arthritis    right knee;injections every 44months   . Asthma   . Bronchiectasis ([place])   . Cancer (HCC)    HX BREAST CANCER  . COMPRESSION FRACTURE, LUMBAR VERTEBRAE 08/21/2008   Qualifier: Diagnosis of  By: Arnoldo Morale MD, Balinda Quails   . Depression   . Diabetes mellitus    takes Amaryl and Januvia daily  . Difficulty sleeping   . Diverticulitis   . Early cataracts, bilateral   . Eczema   . Endometriosis   . Gastritis   . Glaucoma   . Hammer toe   . Headache(784.0)    Migraines  . Hypertension    takes Atenolol daily  . IBS (irritable bowel syndrome)   . Joint pain   . Kidney stone   . Neuropathy   . Open wound of second toe of left foot    at pre-op appt, wound appears clear of infection 1 month post removal of toenail, no obvious exudate present nor any redness,  . Osteomyelitis ([place])    left 2nd toe  . Osteopenia   . PONV (postoperative nausea and vomiting)   . Posterior tibial tendon dysfunction    left foot  . Scoliosis   . Severe esophageal dysplasia   . 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 Thyroid disease   . Vitamin D deficiency    takes Vit d daily    Past Surgical History:  Procedure Laterality Date  . ADENOIDECTOMY     at age 61  . biopsy on back     benign  .  CATARACT EXTRACTION    . CHOLECYSTECTOMY  06/2008  . COLONOSCOPY  11/02/05  . DILATION AND CURETTAGE OF UTERUS    . ENDOMETRIAL BIOPSY  06/22/2011   benign polyp, no hyperplasia  . excision on breast     internal infected suture from breast surgery  . excision removed from neck     infected lymph node  . FOOT SURGERY     left foot-cleaning out areas on toes at the wound center-having MRI to determine if osteomyelitis  . HYSTEROSCOPY  06/22/11   PMB submucosal myoma  . JOINT REPLACEMENT     left total shoulder  . LAPAROSCOPIC OOPHORECTOMY Right 12/2004   absent LSO  . LAPAROTOMY    . MASTECTOMY Bilateral 1994 right, 1995 left  . SHOULDER HEMI-ARTHROPLASTY  01/01/2012   Procedure: SHOULDER HEMI-ARTHROPLASTY;  Surgeon: Roseanne Kaufman, MD;  Location: [facility];  Service: Orthopedics;  Laterality: Left;  Left Shoulder Hemi Arthroplasty with Repair and Reconstruction as Necessary   . THYROIDECTOMY     3yrs ago. follows endocrine  . TONSILLECTOMY    . TOTAL KNEE ARTHROPLASTY Right 12/30/2014   Procedure: RIGHT TOTAL KNEE ARTHROPLASTY;  Surgeon: Gaynelle Arabian, MD;  Location: [facility];  Service: Orthopedics;  Laterality: Right;  . TOTAL KNEE ARTHROPLASTY Left 11/29/2016   Procedure: LEFT TOTAL KNEE ARTHROPLASTY;  Surgeon: Gaynelle Arabian, MD;  Location: [facility];  Service: Orthopedics;  Laterality: Left;    Prior to Admission medications   Medication Sig Start Date End Date Taking? Authorizing Provider  ALPRAZolam Duanne Moron) 0.25 MG tablet Take 0.25 mg by mouth daily as needed for anxiety.  11/26/16  Yes [provider]  atenolol (TENORMIN) 25 MG tablet TAKE ONE TABLET BY MOUTH ONCE DAILY 03/22/17  Yes Marin Olp, MD  budesonide-formoterol [facility]) 160-4.5 MCG/ACT inhaler Inhale 2 puffs into the lungs 2 (two) times daily.   Yes [provider]  cetirizine (ZYRTEC) 10 MG tablet Take 10 mg by mouth daily.   Yes [provider]  Cholecalciferol (VITAMIN D3) 2000 units  capsule Take 2,000 Units by mouth daily.   Yes [provider]  Coenzyme Q10 300 MG CAPS Take 300 mg by mouth daily.   Yes [provider]  dicyclomine (BENTYL) 10 MG capsule Take 2 capsules (20 mg total) by mouth 4 (four) times daily -  before meals and at bedtime. 02/01/17 05/11/17 Yes Lemmon, Lavone Nian, PA  dorzolamide-timolol (COSOPT) 22.3-6.8 MG/ML ophthalmic solution Place 1 drop into both eyes 2 (two) times daily.  02/08/13  Yes [provider]  ESTRING 2 MG vaginal ring 1 ring pv every 3 months 01/14/17  Yes Megan Salon, MD  Flaxseed, Linseed, (FLAX SEEDS PO) Take 1,400 mg by mouth daily.   Yes [provider]  FLUoxetine (PROZAC) 40 MG capsule Take 1 capsule (40 mg total) by mouth daily. 04/25/17  Yes Marin Olp, MD  lisinopril (PRINIVIL,ZESTRIL) 10 MG tablet Take 1 tablet (10 mg total) by mouth daily. Patient taking differently: Take 10 mg by mouth every evening.  02/20/16  Yes Marin Olp, MD  LUMIGAN 0.01 % SOLN Place 1 drop into both eyes at bedtime.  03/26/14  Yes [provider]  Misc Natural Products ([place]) CAPS Take 1 capsule by mouth daily.   Yes [provider]  Multiple Vitamin (MULTIVITAMIN WITH MINERALS) TABS tablet Take 1 tablet by mouth daily.   Yes [provider]  Omega-3 Fatty Acids (FISH OIL) 1200 MG CAPS Take 1,200 mg by mouth daily.   Yes [provider]  Polyvinyl Alcohol-Povidone (REFRESH OP) Apply 1 drop to eye daily as needed (dry eyes).   Yes [provider]  pramipexole (MIRAPEX) 0.5 MG tablet TAKE ONE TABLET BY MOUTH AT BEDTIME 05/02/17  Yes Marin Olp, MD  Selenium 200 MCG CAPS Take 200 mcg by mouth daily.   Yes [provider]  temazepam (RESTORIL) 30 MG capsule TAKE 1 CAPSULE BY MOUTH AT BEDTIME AS NEEDED FOR SLEEP Patient taking differently: Take 30 mgs by mouth at bedtime 02/04/17  Yes Marin Olp, MD  VICTOZA 18 MG/3ML SOPN  INJECT 1.8MG  INTO THE SKIN DAILY 11/05/16  Yes Marin Olp, MD  vitamin B-12 (CYANOCOBALAMIN) 1000 MCG tablet Take 1,000 mcg by mouth daily.   Yes [provider]  glimepiride (AMARYL) 4 MG tablet TAKE ONE TABLET BY MOUTH ONCE DAILY WITH BREAKFAST 05/16/17   Marin Olp, MD  glucose blood (FREESTYLE LITE) test strip USE TO CHECK BLOOD SUGAR DAILY AND AS NEEDED 03/15/17   Marin Olp, MD  Respiratory Therapy Supplies (FLUTTER) DEVI Use as directed 01/08/16   Juanito Doom, MD  Current Facility-Administered Medications  Medication Dose Route Frequency Provider Last Rate Last Dose  . lactated ringers infusion   Intravenous Continuous Mauri Pole, MD        Allergies as of 04/05/2017 - Review Complete 04/05/2017  Allergen Reaction Noted  . Cephalexin Diarrhea 10/14/2006  . Erythromycin ethylsuccinate Hives and Diarrhea 10/14/2006  . Levaquin [levofloxacin in d5w] Other (See Comments) 12/18/2014  . Nitrofurantoin Other (See Comments) 10/14/2006  . Percocet [oxycodone-acetaminophen] Itching 12/31/2011    Family History  Problem Relation Age of Onset  . Arthritis Mother   . COPD Father   . Heart disease Father        MI 36  . Hypertension Father   . Hyperlipidemia Father   . Pancreatic cancer Paternal Grandfather   . Colon cancer Neg Hx   . Esophageal cancer Neg Hx   . Rectal cancer Neg Hx   . Stomach cancer Neg Hx     Social History   Socioeconomic History  . Marital status: Married    Spouse name: Not on file  . Number of children: Not on file  . Years of education: Not on file  . Highest education level: Not on file  Social Needs  . Financial resource strain: Not on file  . Food insecurity - worry: Not on file  . Food insecurity - inability: Not on file  . Transportation needs - medical: Not on file  . Transportation needs - non-medical: Not on file  Occupational History  . Not on file  Tobacco Use  . Smoking status: Never Smoker   . Smokeless tobacco: Never Used  Substance and Sexual Activity  . Alcohol use: No  . Drug use: No  . Sexual activity: Not on file  Other Topics Concern  . Not on file  Social History Narrative   Married 39 years in 2015. 1 adopted son. 1 grandkid (3 yo grandson)      Retired from Development worker, international aid at [employer]: fashion, tv shopping, sewing, decorate    Review of Systems:  All other review of systems negative except as mentioned in the HPI.  Physical Exam: Vital signs in last 24 hours: BP (!) 146/53   Pulse 83   Temp 98.2 F (36.8 C) (Oral)   Resp 12   Ht 6' (1.829 m)   Wt 246 lb (111.6 kg)   LMP 05/10/1992   SpO2 94%   BMI 33.36 kg/m      Body mass index is 33.36 kg/m.  General:   Alert,  Well-developed, well-nourished, pleasant and cooperative in NAD Lungs:  Clear throughout to auscultation.   Heart:  Regular rate and rhythm; no murmurs, clicks, rubs,  or gallops. Abdomen:  Soft, nontender and nondistended. Normal bowel sounds.   Neuro/Psych:  Alert and cooperative. Normal mood and affect. A and O x 3   @K .Denzil Magnuson, MD [phone] Mon-Fri 8a-5p [phone] after 5p, weekends, holidays 05/17/2017 7:23 AM@

## 2017-05-17 NOTE — Op Note (Signed)
[facility] Patient Name: [name] Procedure Date: 05/17/2017 MRN: [id number] Attending MD: Mauri Pole , MD Date of Birth: June 19, 1940 CSN: [id number] Age: 77 Admit Type: Outpatient Procedure:                Colonoscopy Indications:              Clinically significant diarrhea of unexplained                            origin Providers:                Mauri Pole, MD, Kingsley Plan, RN, Janie                            Billups, Technician, Courtney Heys Arm[place], CRNA Referring MD:              Medicines:                Monitored Anesthesia Care Complications:            No immediate complications. Estimated Blood Loss:     Estimated blood loss was minimal. Procedure:                Pre-Anesthesia Assessment:                           - Prior to the procedure, a History and Physical                            was performed, and patient medications and                            allergies were reviewed. The patient's tolerance of                            previous anesthesia was also reviewed. The risks                            and benefits of the procedure and the sedation                            options and risks were discussed with the patient.                            All questions were answered, and informed consent                            was obtained. Prior Anticoagulants: The patient has                            taken no previous anticoagulant or antiplatelet                            agents. ASA Grade Assessment: III - A patient with  severe systemic disease. After reviewing the risks                            and benefits, the patient was deemed in                            satisfactory condition to undergo the procedure.                           After obtaining informed consent, the colonoscope                            was passed under direct vision. Throughout the   procedure, the patient's blood pressure, pulse, and                            oxygen saturations were monitored continuously. The                            EC-[id number] ([id number]) scope was introduced through                            the anus and advanced to the the terminal ileum,                            with identification of the appendiceal orifice and                            IC valve. The colonoscopy was performed without                            difficulty. The patient tolerated the procedure                            well. The quality of the bowel preparation was                            excellent. The terminal ileum, ileocecal valve,                            appendiceal orifice, and rectum were photographed. Scope In: 8:48:26 AM Scope Out: 9:13:03 AM Scope Withdrawal Time: 0 hours 20 minutes 54 seconds  Total Procedure Duration: 0 hours 24 minutes 37 seconds  Findings:      The perianal and digital rectal examinations were normal.      A 18 mm polyp was found in the ascending colon. The polyp was granular       lateral spreading. Preparations were made for mucosal resection. 7 mL of       [place] was injected with adequate lift of the lesion from the       muscularis propria. Snare mucosal resection with suction (via the       working channel) retrieval was performed. A 9-11 mm area was resected.       Resection and retrieval were complete. There was no bleeding at the end  of the procedure.      The terminal ileum appeared normal.      Normal mucosa was found in the entire colon. Biopsies for histology were       taken with a cold forceps from the right colon and left colon for       evaluation of microscopic colitis.      A few small and large-mouthed diverticula were found in the sigmoid       colon and descending colon.      Non-bleeding internal hemorrhoids were found during retroflexion. The       hemorrhoids were small. Impression:               - One 18  mm polyp in the ascending colon, removed                            with mucosal resection. Resected and retrieved.                           - The examined portion of the ileum was normal.                           - Normal mucosa in the entire examined colon.                            Biopsied.                           - Diverticulosis in the sigmoid colon and in the                            descending colon.                           - Non-bleeding internal hemorrhoids.                           - Mucosal resection was performed. Resection and                            retrieval were complete. Moderate Sedation:      N/A- Per Anesthesia Care Recommendation:           - Patient has a contact number available for                            emergencies. The signs and symptoms of potential                            delayed complications were discussed with the                            patient. Return to normal activities tomorrow.                            Written discharge instructions were provided to the  patient.                           - Resume previous diet.                           - Continue present medications.                           - No ibuprofen, naproxen, or other non-steroidal                            anti-inflammatory drugs for 2 weeks.                           - Await pathology results.                           - Repeat colonoscopy in 1-3 years for surveillance                            based on pathology results. Procedure Code(s):        --- Professional ---                           [phone], Colonoscopy, flexible; with endoscopic                            mucosal resection                           45380, 74, Colonoscopy, flexible; with biopsy,                            single or multiple Diagnosis Code(s):        --- Professional ---                           D12.2, Benign neoplasm of ascending colon                            K64.8, Other hemorrhoids                           R19.7, Diarrhea, unspecified                           K57.30, Diverticulosis of large intestine without                            perforation or abscess without bleeding CPT copyright 2016 American Medical Association. All rights reserved. The codes documented in this report are preliminary and upon coder review may  be revised to meet current compliance requirements. Mauri Pole, MD 05/17/2017 9:21:02 AM This report has been signed electronically. Number of Addenda: 0

## 2017-05-17 NOTE — Transfer of Care (Signed)
Immediate Anesthesia Transfer of Care Note  Patient: RILIE GLANZ  Procedure(s) Performed: COLONOSCOPY WITH PROPOFOL (N/A )  Patient Location: PACU and Endoscopy Unit  Anesthesia Type:MAC  Level of Consciousness: awake, alert , oriented and patient cooperative  Airway & Oxygen Therapy: Patient Spontanous Breathing and Patient connected to face mask oxygen  Post-op Assessment: Report given to RN, Post -op Vital signs reviewed and stable and Patient moving all extremities  Post vital signs: Reviewed and stable  Last Vitals:  Vitals:   05/17/17 0738  BP: (!) 146/53  Pulse: 83  Resp: 12  Temp: 36.8 C  SpO2: 94%    Last Pain:  Vitals:   05/17/17 0738  TempSrc: Oral         Complications: No apparent anesthesia complications

## 2017-05-17 NOTE — Discharge Instructions (Signed)

## 2017-05-18 ENCOUNTER — Encounter: Payer: Self-pay | Admitting: Family Medicine

## 2017-05-18 DIAGNOSIS — Z860100 Personal history of colon polyps, unspecified: Secondary | ICD-10-CM | POA: Insufficient documentation

## 2017-05-19 ENCOUNTER — Encounter (HOSPITAL_COMMUNITY): Payer: Self-pay | Admitting: Gastroenterology

## 2017-05-24 ENCOUNTER — Telehealth: Payer: Self-pay | Admitting: Gastroenterology

## 2017-05-25 NOTE — Telephone Encounter (Signed)
The polyp removed was precancerous (tubular adenoma). Repeat colonoscopy for surveillance in 1 year. Polyp was removed piecemeal. Please inform patient the results. Thanks

## 2017-05-25 NOTE — Telephone Encounter (Signed)
Left a message to call back.

## 2017-05-25 NOTE — Telephone Encounter (Signed)
Explained her results from pathology. She states she is glad she had the colonoscopy. Also asks what is the plan for her 2 times a day soft urgent bowel movements. She mentions IBS. Thank you

## 2017-05-25 NOTE — Telephone Encounter (Signed)
She has a pathology report in her chart that she is calling about. Thanks

## 2017-05-25 NOTE — Telephone Encounter (Signed)
Please advise her to start Colestid 2gm twice daily to see if it helps with her symptoms given she continues to have some symptoms. No evidence of microscopic colitis based on biopsies. Thanks

## 2017-05-26 ENCOUNTER — Other Ambulatory Visit: Payer: Self-pay

## 2017-05-26 MED ORDER — COLESTIPOL HCL 1 G PO TABS: 2.0000 g | ORAL_TABLET | Freq: Two times a day (BID) | ORAL | 0 refills | Status: DC

## 2017-05-26 NOTE — Telephone Encounter (Signed)
Patient is advised of the medication. She agrees to try this and give [name] feedback.

## 2017-05-30 ENCOUNTER — Telehealth: Payer: Self-pay | Admitting: Gastroenterology

## 2017-05-31 NOTE — Telephone Encounter (Signed)
Please advise patient to try FD gard 1 capsule TID. Thanks

## 2017-05-31 NOTE — Telephone Encounter (Signed)
She has not tolerated several medications or the low fodmap diet. Are there other options.

## 2017-06-01 NOTE — Telephone Encounter (Signed)
Discussed this option with the patient. She will try this. She thinks she may have done this, but is willing to try it again if she has.

## 2017-06-07 ENCOUNTER — Other Ambulatory Visit: Payer: Self-pay | Admitting: Family Medicine

## 2017-06-22 DIAGNOSIS — L603 Nail dystrophy: Secondary | ICD-10-CM | POA: Diagnosis not present

## 2017-06-22 DIAGNOSIS — L84 Corns and callosities: Secondary | ICD-10-CM | POA: Diagnosis not present

## 2017-06-22 DIAGNOSIS — E1151 Type 2 diabetes mellitus with diabetic peripheral angiopathy without gangrene: Secondary | ICD-10-CM | POA: Diagnosis not present

## 2017-06-22 DIAGNOSIS — I739 Peripheral vascular disease, unspecified: Secondary | ICD-10-CM | POA: Diagnosis not present

## 2017-07-13 ENCOUNTER — Telehealth: Payer: Self-pay | Admitting: Family Medicine

## 2017-07-13 NOTE — Telephone Encounter (Signed)
Copied from [place] [phone]. Topic: Quick Communication - Rx Refill/Question >> Jul 13, 2017  5:46 PM Oliver Pila B wrote: Medication: temazepam (RESTORIL) 30 MG capsule [[id number]]    Has the patient contacted their pharmacy? Yes.     (Agent: If no, request that the patient contact the pharmacy for the refill.)   Preferred Pharmacy (with phone number or street name): [facility]   Agent: Please be advised that RX refills may take up to 3 business days. We ask that you follow-up with your pharmacy.

## 2017-07-14 ENCOUNTER — Encounter: Payer: Self-pay | Admitting: Obstetrics & Gynecology

## 2017-07-14 ENCOUNTER — Other Ambulatory Visit: Payer: Self-pay

## 2017-07-14 ENCOUNTER — Ambulatory Visit (INDEPENDENT_AMBULATORY_CARE_PROVIDER_SITE_OTHER): Payer: Medicare Other | Admitting: Obstetrics & Gynecology

## 2017-07-14 VITALS — BP 128/66 | HR 100 | Resp 16 | Wt 245.0 lb

## 2017-07-14 DIAGNOSIS — N952 Postmenopausal atrophic vaginitis: Secondary | ICD-10-CM

## 2017-07-14 MED ORDER — ALPRAZOLAM 0.25 MG PO TABS: 0.2500 mg | ORAL_TABLET | Freq: Every day | ORAL | 0 refills | Status: DC | PRN

## 2017-07-14 MED ORDER — TEMAZEPAM 15 MG PO CAPS: 30.0000 mg | ORAL_CAPSULE | Freq: Every evening | ORAL | 5 refills | Status: DC | PRN

## 2017-07-14 NOTE — Progress Notes (Signed)
GYNECOLOGY  VISIT  CC:   Estring replacement  HPI: 77 y.o. G0P0 Married Caucasian female here for estring change.  Denies vaginal/vular complaints.  Has no itching, vaginal discharge or vaginal bleeding.  Still using a walker.  Breathing is much better.    GYNECOLOGIC HISTORY: Patient's last menstrual period was 05/10/1992. Contraception: post menopausal  Menopausal hormone therapy: none  Patient Active Problem List   Diagnosis Date Noted  . History of adenomatous polyp of colon 05/18/2017  . Diarrhea   . Polyp of ascending colon   . Arthritis of midfoot 03/03/2016  . Hammertoes of both feet 03/03/2016  . Aortic atherosclerosis ([place]) 02/16/2016  . Solitary pulmonary nodule 01/08/2016  . 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  . Bronchiectasis without acute exacerbation ([place]) 10/18/2015  . Acute diverticulitis 10/05/2015  . OA (osteoarthritis) of knee 12/30/2014  . IBS (irritable bowel syndrome) 07/17/2014  . CKD (chronic kidney disease), stage II 01/22/2014  . Depression, major, single episode, complete remission ([place]) 01/03/2014  . Posterior tibial tendon dysfunction 01/03/2014  . Osteoarthritis, knee 01/03/2014  . Glaucoma 01/03/2014  . Essential hypertension, benign 01/03/2014  . Obesity (BMI 30-39.9) 06/15/2013  . Primary open-angle glaucoma 08/02/2012  . Foot tendinitis 07/20/2010  . INSOMNIA, CHRONIC 07/25/2009  . Fatty liver 03/18/2009  . GERD 12/25/2007  . Hyperlipidemia 07/26/2007  . ANEMIA, B12 DEFICIENCY 12/29/2006  . Diabetes mellitus type II, controlled ([place]) 12/28/2006  . RESTLESS LEG SYNDROME, MILD 12/28/2006  . NEUROPATHY, IDIOPATHIC PERIPHERAL NEC 12/28/2006  . OSTEOPOROSIS 12/12/2006  . BREAST CANCER, HX OF 12/12/2006    Past Medical History:  Diagnosis Date  . Allergy   . Anemia   . Anxiety   . Arthritis    right knee;injections every 60months   . Asthma   . Bronchiectasis ([place])   . Cancer (HCC)    HX BREAST CANCER  . COMPRESSION FRACTURE, LUMBAR  VERTEBRAE 08/21/2008   Qualifier: Diagnosis of  By: Arnoldo Morale MD, Balinda Quails   . Depression   . Diabetes mellitus    takes Amaryl and Januvia daily  . Difficulty sleeping   . Diverticulitis   . Early cataracts, bilateral   . Eczema   . Endometriosis   . Gastritis   . Glaucoma   . Hammer toe   . Headache(784.0)    Migraines  . Hypertension    takes Atenolol daily  . IBS (irritable bowel syndrome)   . Joint pain   . Kidney stone   . Neuropathy   . Open wound of second toe of left foot    at pre-op appt, wound appears clear of infection 1 month post removal of toenail, no obvious exudate present nor any redness,  . Osteomyelitis ([place])    left 2nd toe  . Osteopenia   . PONV (postoperative nausea and vomiting)   . Posterior tibial tendon dysfunction    left foot  . Scoliosis   . Severe esophageal dysplasia   . 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 Thyroid disease   . Vitamin D deficiency    takes Vit d daily    Past Surgical History:  Procedure Laterality Date  . ADENOIDECTOMY     at age 55  . biopsy on back     benign  . CATARACT EXTRACTION    . CHOLECYSTECTOMY  06/2008  . COLONOSCOPY  11/02/05  . COLONOSCOPY WITH PROPOFOL N/A 05/17/2017   Procedure: COLONOSCOPY WITH PROPOFOL;  Surgeon: Mauri Pole, MD;  Location: [facility];  Service: Endoscopy;  Laterality: N/A;  PT WILL BE ADMITTED THE DAY BEFORE FOR PREP PER ROBIN KB  . DILATION AND CURETTAGE OF UTERUS    . ENDOMETRIAL BIOPSY  06/22/2011   benign polyp, no hyperplasia  . excision on breast     internal infected suture from breast surgery  . excision removed from neck     infected lymph node  . FOOT SURGERY     left foot-cleaning out areas on toes at the wound center-having MRI to determine if osteomyelitis  . HYSTEROSCOPY  06/22/11   PMB submucosal myoma  . JOINT REPLACEMENT     left total shoulder  . LAPAROSCOPIC OOPHORECTOMY Right 12/2004   absent LSO  . LAPAROTOMY     . MASTECTOMY Bilateral 1994 right, 1995 left  . SHOULDER HEMI-ARTHROPLASTY  01/01/2012   Procedure: SHOULDER HEMI-ARTHROPLASTY;  Surgeon: Roseanne Kaufman, MD;  Location: [facility];  Service: Orthopedics;  Laterality: Left;  Left Shoulder Hemi Arthroplasty with Repair and Reconstruction as Necessary   . THYROIDECTOMY     83yrs ago. follows endocrine  . TONSILLECTOMY    . TOTAL KNEE ARTHROPLASTY Right 12/30/2014   Procedure: RIGHT TOTAL KNEE ARTHROPLASTY;  Surgeon: Gaynelle Arabian, MD;  [facility];  Service: Orthopedics;  Laterality: Right;  . TOTAL KNEE ARTHROPLASTY Left 11/29/2016   Procedure: LEFT TOTAL KNEE ARTHROPLASTY;  Surgeon: Gaynelle Arabian, MD;  [facility];  Service: Orthopedics;  Laterality: Left;    MEDS:   Current Outpatient Medications on File Prior to Visit  Medication Sig Dispense Refill  . ALPRAZolam (XANAX) 0.25 MG tablet Take 0.25 mg by mouth daily as needed for anxiety.     [name] atenolol (TENORMIN) 25 MG tablet TAKE ONE TABLET BY MOUTH ONCE DAILY 90 tablet 3  . budesonide-formoterol (SYMBICORT) 160-4.5 MCG/ACT inhaler Inhale 2 puffs into the lungs 2 (two) times daily.    . cetirizine (ZYRTEC) 10 MG tablet Take 10 mg by mouth daily.    . Cholecalciferol (VITAMIN D3) 2000 units capsule Take 2,000 Units by mouth daily.    . Coenzyme Q10 300 MG CAPS Take 300 mg by mouth daily.    . colestipol (COLESTID) 1 g tablet Take 2 tablets (2 g total) by mouth 2 (two) times daily. 120 tablet 0  . dorzolamide-timolol (COSOPT) 22.3-6.8 MG/ML ophthalmic solution Place 1 drop into both eyes 2 (two) times daily.     [name] ESTRING 2 MG vaginal ring 1 ring pv every 3 months 1 each 4  . Flaxseed, Linseed, (FLAX SEEDS PO) Take 1,400 mg by mouth daily.    [name] FLUoxetine (PROZAC) 40 MG capsule Take 1 capsule (40 mg total) by mouth daily. 90 capsule 3  . glimepiride (AMARYL) 4 MG tablet TAKE ONE TABLET BY MOUTH ONCE DAILY WITH BREAKFAST 90 tablet 3  . glucose blood (FREESTYLE LITE) test strip USE TO  CHECK BLOOD SUGAR DAILY AND AS NEEDED 50 each 11  . lisinopril (PRINIVIL,ZESTRIL) 10 MG tablet Take 1 tablet (10 mg total) by mouth daily. (Patient taking differently: Take 10 mg by mouth every evening. ) 90 tablet 3  . LUMIGAN 0.01 % SOLN Place 1 drop into both eyes at bedtime.     . Misc Natural Products (GRAPE SEED COMPLEX) CAPS Take 1 capsule by mouth daily.    . Multiple Vitamin (MULTIVITAMIN WITH MINERALS) TABS tablet Take 1 tablet by mouth daily.    . Omega-3 Fatty Acids (FISH OIL) 1200 MG CAPS Take 1,200 mg by  mouth daily.    . Polyvinyl Alcohol-Povidone (REFRESH OP) Apply 1 drop to eye daily as needed (dry eyes).    . pramipexole (MIRAPEX) 0.5 MG tablet TAKE ONE TABLET BY MOUTH AT BEDTIME 90 tablet 1  . Respiratory Therapy Supplies (FLUTTER) DEVI Use as directed 1 each 0  . Selenium 200 MCG CAPS Take 200 mcg by mouth daily.    [name] VICTOZA 18 MG/3ML SOPN INJECT 1.8MG  INTO THE SKIN DAILY 9 pen 6  . vitamin B-12 (CYANOCOBALAMIN) 1000 MCG tablet Take 1,000 mcg by mouth daily.    [name] dicyclomine (BENTYL) 10 MG capsule Take 2 capsules (20 mg total) by mouth 4 (four) times daily -  before meals and at bedtime. 240 capsule 3   No current facility-administered medications on file prior to visit.     ALLERGIES: Cephalexin; Erythromycin ethylsuccinate; Levaquin [levofloxacin in d5w]; Nitrofurantoin; and Percocet [oxycodone-acetaminophen]  Family History  Problem Relation Age of Onset  . Arthritis Mother   . COPD Father   . Heart disease Father        MI 31  . Hypertension Father   . Hyperlipidemia Father   . Pancreatic cancer Paternal Grandfather   . Colon cancer Neg Hx   . Esophageal cancer Neg Hx   . Rectal cancer Neg Hx   . Stomach cancer Neg Hx     SH:  Married, non smoker  Review of Systems  All other systems reviewed and are negative.   PHYSICAL EXAMINATION:    BP 128/66 (BP Location: Right Arm, Patient Position: Sitting, Cuff Size: Large)   Pulse 100   Resp 16   Wt 245  lb (111.1 kg)   LMP 05/10/1992   BMI 33.23 kg/m     General appearance: alert, cooperative and appears stated age  Pelvic: External genitalia:  no lesions              Urethra:  normal appearing urethra with no masses, tenderness or lesions              Bartholins and Skenes: normal                 Vagina: normal appearing vagina with normal color and discharge, no lesions              Cervix: no lesions              Bimanual Exam:  Uterus:  normal size, contour, position, consistency, mobility, non-tender              Adnexa: normal adnexa and no mass, fullness, tenderness  Estring removed and new one replaced.    Chaperone was present for exam.  Assessment: Vaginal atrophic, Estring use with inability to remove on her own  Plan: Recheck 3 months or sooner with any new issues/concerns

## 2017-07-15 DIAGNOSIS — D518 Other vitamin B12 deficiency anemias: Secondary | ICD-10-CM | POA: Diagnosis not present

## 2017-07-16 ENCOUNTER — Encounter: Payer: Self-pay | Admitting: Family Medicine

## 2017-07-18 ENCOUNTER — Other Ambulatory Visit: Payer: Self-pay

## 2017-07-18 MED ORDER — TEMAZEPAM 15 MG PO CAPS: 30.0000 mg | ORAL_CAPSULE | Freq: Every evening | ORAL | 5 refills | Status: DC | PRN

## 2017-07-25 ENCOUNTER — Encounter: Payer: Self-pay | Admitting: Family Medicine

## 2017-07-29 ENCOUNTER — Telehealth: Payer: Self-pay | Admitting: Gastroenterology

## 2017-08-01 NOTE — Telephone Encounter (Signed)
Report no improvement with IB Guard. She is now trying dicyclomine before meals, Benefiber every pm, and Imodium every morning. Reports she will go 2 or 3 days and have control and normal bowel movements. She will on other days have urgent loose bowel movements. She requests an appointment with Dr Silverio Decamp.

## 2017-08-01 NOTE — Telephone Encounter (Signed)
Left message to call back to discuss her symptoms. 

## 2017-08-02 ENCOUNTER — Encounter: Payer: Self-pay | Admitting: Family Medicine

## 2017-08-05 ENCOUNTER — Other Ambulatory Visit: Payer: Self-pay | Admitting: Physician Assistant

## 2017-08-05 NOTE — Telephone Encounter (Signed)
Confirmed the appointment with the patient.

## 2017-08-05 NOTE — Telephone Encounter (Signed)
Called to patient's home to offer an appointment on 08/10/17. No answer. No voicemail.

## 2017-08-06 ENCOUNTER — Encounter: Payer: Self-pay | Admitting: Family Medicine

## 2017-08-10 ENCOUNTER — Encounter: Payer: Self-pay | Admitting: Gastroenterology

## 2017-08-10 ENCOUNTER — Ambulatory Visit (INDEPENDENT_AMBULATORY_CARE_PROVIDER_SITE_OTHER): Payer: Medicare Other | Admitting: Gastroenterology

## 2017-08-10 VITALS — BP 110/66 | HR 76 | Ht 72.0 in | Wt 252.0 lb

## 2017-08-10 DIAGNOSIS — K580 Irritable bowel syndrome with diarrhea: Secondary | ICD-10-CM

## 2017-08-10 MED ORDER — DICYCLOMINE HCL 10 MG PO CAPS: 20.0000 mg | ORAL_CAPSULE | Freq: Four times a day (QID) | ORAL | 3 refills | Status: DC | PRN

## 2017-08-10 MED ORDER — COLESEVELAM HCL 625 MG PO TABS: 625.0000 mg | ORAL_TABLET | Freq: Three times a day (TID) | ORAL | 2 refills | Status: DC

## 2017-08-10 NOTE — Progress Notes (Signed)
[name]    [id number]    17-Jun-1940  Primary Care Physician:Hunter, Brayton Mars, MD  Referring Physician: Marin Olp, MD [place], [place] [address]  Chief complaint: Diarrhea, fecal urgency HPI: 76 year old female with history of type 2 diabetes, chronic kidney disease, hypertension, obesity, chronic GERD and irritable bowel syndrome predominant diarrhea here for follow-up visit.  Patient feels her symptoms and not well controlled.  She continues to have loose to semi-formed bowel movements intermittently.  On most days she has 1-2 bowel movements in the morning but she is bothered by fecal urgency associated with a semi-formed stool. She did not tolerate Lomotil or cholestyramine.  She is trying to do a lactose-free diet.  She is not sure which foods make her symptoms worse.  She also states that recently she has been under tremendous stress and anxiety due to her son's medical and behavioral issues and also worried about her grandchild, does think her anxiety is making her symptoms worse. Currently taking Citrucel at bedtime and dicyclomine 3-4 times daily as needed.  Imodium 1 tablet daily on regular basis. Denies any blood per rectum, abdominal pain, vomiting, dysphagia, melena.    She wants to know if she could be seen in [facility], is interested in taking part in any clinical trials or  any new medications that she could try to improve her symptoms.                                                                                                  Outpatient Encounter Medications as of 08/10/2017  Medication Sig  . ALPRAZolam (XANAX) 0.25 MG tablet Take 1 tablet (0.25 mg total) by mouth daily as needed for anxiety.  [name] atenolol (TENORMIN) 25 MG tablet TAKE ONE TABLET BY MOUTH ONCE DAILY  . budesonide-formoterol (SYMBICORT) 160-4.5 MCG/ACT inhaler Inhale 2 puffs into the lungs 2 (two) times daily.  . cetirizine (ZYRTEC) 10 MG tablet Take 10 mg  by mouth daily.  . Cholecalciferol (VITAMIN D3) 2000 units capsule Take 2,000 Units by mouth daily.  . Coenzyme Q10 300 MG CAPS Take 300 mg by mouth daily.  . colestipol (COLESTID) 1 g tablet Take 2 tablets (2 g total) by mouth 2 (two) times daily.  . dorzolamide-timolol (COSOPT) 22.3-6.8 MG/ML ophthalmic solution Place 1 drop into both eyes 2 (two) times daily.   [name] ESTRING 2 MG vaginal ring 1 ring pv every 3 months  . Flaxseed, Linseed, (FLAX SEEDS PO) Take 1,400 mg by mouth daily.  [name] FLUoxetine (PROZAC) 40 MG capsule Take 1 capsule (40 mg total) by mouth daily.  [name] glimepiride (AMARYL) 4 MG tablet TAKE ONE TABLET BY MOUTH ONCE DAILY WITH BREAKFAST  . glucose blood (FREESTYLE LITE) test strip USE TO CHECK BLOOD SUGAR DAILY AND AS NEEDED  . lisinopril (PRINIVIL,ZESTRIL) 10 MG tablet Take 1 tablet (10 mg total) by mouth daily. (Patient taking differently: Take 10 mg by mouth every evening. )  . LUMIGAN 0.01 % SOLN Place 1 drop into both eyes at  bedtime.   . Misc Natural Products (GRAPE SEED COMPLEX) CAPS Take 1 capsule by mouth daily.  . Multiple Vitamin (MULTIVITAMIN WITH MINERALS) TABS tablet Take 1 tablet by mouth daily.  . Omega-3 Fatty Acids (FISH OIL) 1200 MG CAPS Take 1,200 mg by mouth daily.  . Polyvinyl Alcohol-Povidone (REFRESH OP) Apply 1 drop to eye daily as needed (dry eyes).  . pramipexole (MIRAPEX) 0.5 MG tablet TAKE ONE TABLET BY MOUTH AT BEDTIME  . Respiratory Therapy Supplies (FLUTTER) DEVI Use as directed  . Selenium 200 MCG CAPS Take 200 mcg by mouth daily.  . temazepam (RESTORIL) 15 MG capsule Take 2 capsules (30 mg total) by mouth at bedtime as needed for sleep.  [name] VICTOZA 18 MG/3ML SOPN INJECT 1.8MG  INTO THE SKIN DAILY  . vitamin B-12 (CYANOCOBALAMIN) 1000 MCG tablet Take 1,000 mcg by mouth daily.  [name] dicyclomine (BENTYL) 10 MG capsule Take 2 capsules (20 mg total) by mouth 4 (four) times daily -  before meals and at bedtime.   No facility-administered encounter  medications on file as of 08/10/2017.     Allergies as of 08/10/2017 - Review Complete 08/10/2017  Allergen Reaction Noted  . Cephalexin Diarrhea 10/14/2006  . Erythromycin ethylsuccinate Hives and Diarrhea 10/14/2006  . Levaquin [levofloxacin in d5w] Other (See Comments) 12/18/2014  . Nitrofurantoin Other (See Comments) 10/14/2006  . Percocet [oxycodone-acetaminophen] Itching 12/31/2011    Past Medical History:  Diagnosis Date  . Allergy   . Anemia   . Anxiety   . Arthritis    right knee;injections every 58months   . Asthma   . Bronchiectasis ([place])   . Cancer (HCC)    HX BREAST CANCER  . COMPRESSION FRACTURE, LUMBAR VERTEBRAE 08/21/2008   Qualifier: Diagnosis of  By: Arnoldo Morale MD, Balinda Quails   . Depression   . Diabetes mellitus    takes Amaryl and Januvia daily  . Difficulty sleeping   . Diverticulitis   . Early cataracts, bilateral   . Eczema   . Endometriosis   . Gastritis   . Glaucoma   . Hammer toe   . Headache(784.0)    Migraines  . Hypertension    takes Atenolol daily  . IBS (irritable bowel syndrome)   . Joint pain   . Kidney stone   . Neuropathy   . Open wound of second toe of left foot    at pre-op appt, wound appears clear of infection 1 month post removal of toenail, no obvious exudate present nor any redness,  . Osteomyelitis ([place])    left 2nd toe  . Osteopenia   . PONV (postoperative nausea and vomiting)   . Posterior tibial tendon dysfunction    left foot  . Scoliosis   . Severe esophageal dysplasia   . 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 Thyroid disease   . Vitamin D deficiency    takes Vit d daily    Past Surgical History:  Procedure Laterality Date  . ADENOIDECTOMY     at age 62  . biopsy on back     benign  . CATARACT EXTRACTION    . CHOLECYSTECTOMY  06/2008  . COLONOSCOPY  11/02/05  . COLONOSCOPY WITH PROPOFOL N/A 05/17/2017   Procedure: COLONOSCOPY WITH PROPOFOL;  Surgeon: Mauri Pole, MD;   [facility];  Service: Endoscopy;  Laterality: N/A;  PT WILL BE ADMITTED THE DAY BEFORE FOR PREP PER ROBIN KB  . DILATION AND CURETTAGE OF  UTERUS    . ENDOMETRIAL BIOPSY  06/22/2011   benign polyp, no hyperplasia  . excision on breast     internal infected suture from breast surgery  . excision removed from neck     infected lymph node  . FOOT SURGERY     left foot-cleaning out areas on toes at the wound center-having MRI to determine if osteomyelitis  . HYSTEROSCOPY  06/22/11   PMB submucosal myoma  . JOINT REPLACEMENT     left total shoulder  . LAPAROSCOPIC OOPHORECTOMY Right 12/2004   absent LSO  . LAPAROTOMY    . MASTECTOMY Bilateral 1994 right, 1995 left  . SHOULDER HEMI-ARTHROPLASTY  01/01/2012   Procedure: SHOULDER HEMI-ARTHROPLASTY;  Surgeon: Roseanne Kaufman, MD;  Location: [facility];  Service: Orthopedics;  Laterality: Left;  Left Shoulder Hemi Arthroplasty with Repair and Reconstruction as Necessary   . THYROIDECTOMY     33yrs ago. follows endocrine  . TONSILLECTOMY    . TOTAL KNEE ARTHROPLASTY Right 12/30/2014   Procedure: RIGHT TOTAL KNEE ARTHROPLASTY;  Surgeon: Gaynelle Arabian, MD;  [facility];  Service: Orthopedics;  Laterality: Right;  . TOTAL KNEE ARTHROPLASTY Left 11/29/2016   Procedure: LEFT TOTAL KNEE ARTHROPLASTY;  Surgeon: Gaynelle Arabian, MD;  [facility];  Service: Orthopedics;  Laterality: Left;    Family History  Problem Relation Age of Onset  . Arthritis Mother   . COPD Father   . Heart disease Father        MI 72  . Hypertension Father   . Hyperlipidemia Father   . Pancreatic cancer Paternal Grandfather   . Colon cancer Neg Hx   . Esophageal cancer Neg Hx   . Rectal cancer Neg Hx   . Stomach cancer Neg Hx     Social History   Socioeconomic History  . Marital status: Married    Spouse name: Not on file  . Number of children: Not on file  . Years of education: Not on file  . Highest education level: Not on file  Occupational History   . Not on file  Social Needs  . Financial resource strain: Not on file  . Food insecurity:    Worry: Not on file    Inability: Not on file  . Transportation needs:    Medical: Not on file    Non-medical: Not on file  Tobacco Use  . Smoking status: Never Smoker  . Smokeless tobacco: Never Used  Substance and Sexual Activity  . Alcohol use: No  . Drug use: No  . Sexual activity: Not on file  Lifestyle  . Physical activity:    Days per week: Not on file    Minutes per session: Not on file  . Stress: Not on file  Relationships  . Social connections:    Talks on phone: Not on file    Gets together: Not on file    Attends religious service: Not on file    Active member of club or organization: Not on file    Attends meetings of clubs or organizations: Not on file    Relationship status: Not on file  . Intimate partner violence:    Fear of current or ex partner: Not on file    Emotionally abused: Not on file    Physically abused: Not on file    Forced sexual activity: Not on file  Other Topics Concern  . Not on file  Social History Narrative   Married 39 years in 2015. 1 adopted son.  1 grandkid (17 yo grandson)      Retired from Development worker, international aid at [employer]: fashion, tv shopping, sewing, decorate      Review of systems: Review of Systems  Constitutional: Negative for fever and chills. Positive for lack of energy HENT: Negative.   Eyes: Negative for blurred vision.  Respiratory: Negative for cough, shortness of breath and wheezing.   Cardiovascular: Negative for chest pain and palpitations.  Gastrointestinal: as per HPI Genitourinary: Negative for dysuria, urgency, frequency and hematuria.  Musculoskeletal: Negative for myalgias, back pain and joint pain.  Skin: Negative for itching and rash.  Neurological: Negative for dizziness, tremors, focal weakness, seizures and loss of consciousness. Positive for problems with balance Endo/Heme/Allergies: Positive for seasonal  allergies.  Psychiatric/Behavioral: Negative for depression, suicidal ideas and hallucinations. Positive for irritability and insomnia All other systems reviewed and are negative.   Physical Exam: Vitals:   08/10/17 0949  BP: 110/66  Pulse: 76   Body mass index is 34.18 kg/m. Gen:      No acute distress, walks with a walker HEENT:  EOMI, sclera anicteric Neck:     No masses; no thyromegaly Lungs:    Clear to auscultation bilaterally; normal respiratory effort CV:         Regular rate and rhythm; no murmurs Abd:      + bowel sounds; soft, non-tender; no palpable masses, no distension Ext:    No edema; adequate peripheral perfusion Skin:      Warm and dry; no rash Neuro: alert and oriented x 3 Psych: normal mood and affect  Data Reviewed:  Reviewed labs, radiology imaging, old records and pertinent past GI work up   Assessment and Plan/Recommendations:  77 year old female with history of obesity, hypertension, diabetes GERD, irritable bowel syndrome predominant diarrhea here for follow-up visit Patient did not tolerate cholestyramine or Lomotil. Will try WelChol 1 tablet 3 times daily with meals Creon 72,000 units with meals and 36,000 units with snack for 2 days to see if she has a component of pancreatic insufficiency Continue Citrucel Continue dicyclomine as needed Probiotic, align 1 capsule daily for 4-6 weeks Patient had refused pelvic floor physical therapy in the past to improve anal sphincter tone, will revisit again in the near future if she will be willing to do it Will refer patient to [facility] as per her request  25 minutes was spent face-to-face with the patient. Greater than 50% of the time used for counseling as well as treatment plan and follow-up. She had multiple questions which were answered to her satisfaction  K. Denzil Magnuson , MD [phone] Mon-Fri 8a-5p [phone] after 5p, weekends, holidays  CC: Marin Olp, MD

## 2017-08-10 NOTE — Patient Instructions (Addendum)
Normal BMI (Body Mass Index- based on height and weight) is between 23 and 30. Your BMI today is Body mass index is 34.18 kg/m. [name] Please consider follow up  regarding your BMI with your Primary Care Provider.  We have sent medications to your pharmacy for you to pick up at your convenience  Please purchase the following medications over the counter and take as directed:Imodium AD as needed daily Align 1 cap daily  Continue Citracel daily at bedtime.  You will be referred to [facility] We will contact you with that appointment. :

## 2017-08-11 ENCOUNTER — Telehealth: Payer: Self-pay | Admitting: *Deleted

## 2017-08-11 NOTE — Telephone Encounter (Signed)
Need to refer patient to [facility] for diarrhea IBS-D   Dr Silverio Decamp is this referral for a second opinion ?

## 2017-08-11 NOTE — Telephone Encounter (Signed)
Patient wants to know if they have any ongoing clinical trials or medications to help with IBS-D?

## 2017-08-12 ENCOUNTER — Encounter: Payer: Self-pay | Admitting: Gastroenterology

## 2017-08-12 NOTE — Telephone Encounter (Signed)
Waiting on office note to do referral  Contact [phone]

## 2017-08-15 NOTE — Telephone Encounter (Signed)
Faxed all records and referral form to [facility] today

## 2017-08-29 DIAGNOSIS — D518 Other vitamin B12 deficiency anemias: Secondary | ICD-10-CM | POA: Diagnosis not present

## 2017-08-29 DIAGNOSIS — D511 Vitamin B12 deficiency anemia due to selective vitamin B12 malabsorption with proteinuria: Secondary | ICD-10-CM | POA: Diagnosis not present

## 2017-08-30 ENCOUNTER — Encounter: Payer: Self-pay | Admitting: Family Medicine

## 2017-09-01 DIAGNOSIS — H401133 Primary open-angle glaucoma, bilateral, severe stage: Secondary | ICD-10-CM | POA: Diagnosis not present

## 2017-09-01 DIAGNOSIS — H04123 Dry eye syndrome of bilateral lacrimal glands: Secondary | ICD-10-CM | POA: Diagnosis not present

## 2017-09-01 DIAGNOSIS — H524 Presbyopia: Secondary | ICD-10-CM | POA: Diagnosis not present

## 2017-09-02 LAB — HM DIABETES EYE EXAM

## 2017-09-06 ENCOUNTER — Encounter: Payer: Self-pay | Admitting: Family Medicine

## 2017-09-08 ENCOUNTER — Ambulatory Visit: Payer: Medicare Other | Admitting: Gastroenterology

## 2017-09-08 DIAGNOSIS — L603 Nail dystrophy: Secondary | ICD-10-CM | POA: Diagnosis not present

## 2017-09-08 DIAGNOSIS — E1151 Type 2 diabetes mellitus with diabetic peripheral angiopathy without gangrene: Secondary | ICD-10-CM | POA: Diagnosis not present

## 2017-09-08 DIAGNOSIS — L84 Corns and callosities: Secondary | ICD-10-CM | POA: Diagnosis not present

## 2017-09-08 DIAGNOSIS — I739 Peripheral vascular disease, unspecified: Secondary | ICD-10-CM | POA: Diagnosis not present

## 2017-09-23 ENCOUNTER — Ambulatory Visit: Payer: Medicare Other | Admitting: Family Medicine

## 2017-10-06 ENCOUNTER — Ambulatory Visit (INDEPENDENT_AMBULATORY_CARE_PROVIDER_SITE_OTHER): Payer: Medicare Other | Admitting: Family Medicine

## 2017-10-06 ENCOUNTER — Encounter: Payer: Self-pay | Admitting: Family Medicine

## 2017-10-06 VITALS — BP 130/70 | HR 87 | Temp 99.2°F | Ht 72.0 in | Wt 245.0 lb

## 2017-10-06 DIAGNOSIS — R350 Frequency of micturition: Secondary | ICD-10-CM

## 2017-10-06 DIAGNOSIS — I700 Atherosclerosis of aorta: Secondary | ICD-10-CM | POA: Diagnosis not present

## 2017-10-06 DIAGNOSIS — I10 Essential (primary) hypertension: Secondary | ICD-10-CM

## 2017-10-06 DIAGNOSIS — D518 Other vitamin B12 deficiency anemias: Secondary | ICD-10-CM

## 2017-10-06 DIAGNOSIS — J479 Bronchiectasis, uncomplicated: Secondary | ICD-10-CM | POA: Diagnosis not present

## 2017-10-06 DIAGNOSIS — F325 Major depressive disorder, single episode, in full remission: Secondary | ICD-10-CM | POA: Diagnosis not present

## 2017-10-06 DIAGNOSIS — R5383 Other fatigue: Secondary | ICD-10-CM

## 2017-10-06 DIAGNOSIS — E119 Type 2 diabetes mellitus without complications: Secondary | ICD-10-CM

## 2017-10-06 LAB — POC URINALSYSI DIPSTICK (AUTOMATED)
Bilirubin, UA: NEGATIVE
Glucose, UA: NEGATIVE
Ketones, UA: NEGATIVE
NITRITE UA: NEGATIVE
Protein, UA: POSITIVE — AB
RBC UA: NEGATIVE
Spec Grav, UA: >=1.03 — AB (ref 1.010–1.025)
UROBILINOGEN UA: 0.2 U/dL
pH, UA: 5 (ref 5.0–8.0)

## 2017-10-06 NOTE — Assessment & Plan Note (Signed)
S: phq9 of last visit at 26- now it is 5. Stress having to find adoptive home for her grandson who they are caring for now but know they cannot long term. We tried to change rx and didn't help much- now on prozac 40mg  A/P: Mild poor control today.  Multiple stressors particularly finding a long-term home for her grandson.  She does not want to increase or change medications.  We discussed adding counseling through [facility]-she is considering

## 2017-10-06 NOTE — Assessment & Plan Note (Signed)
S:  controlled on amaryl 4mg  and victoza. Doesn't do well with metformin due to GI/IBS issues.  Lab Results  Component Value Date   HGBA1C 6.1 02/01/2017   HGBA1C 6.9 (H) 10/21/2016   HGBA1C 6.3 07/23/2016   A/P: Update A1c-continue current medications

## 2017-10-06 NOTE — Patient Instructions (Addendum)
Please stop by lab before you go  We should know by Monday if you have a true urinary tract infection. You were slightly dehydrated on urine- so make sure to stay well hydrated  Get back to [facility] with PT for core strengthening/help with the weak back  Consider adding counseling  By the way- blood pressure looks great once gain!   See me in 2 months so we can check in on fatigue and depressed mood. We will try to get foot exam done at that time.

## 2017-10-06 NOTE — Assessment & Plan Note (Signed)
S:  she remains on b12 injections on monthly basis A/P: update b12 today Lab Results  Component Value Date   VITAMINB12 286 10/21/2016

## 2017-10-06 NOTE — Assessment & Plan Note (Signed)
S: controlled on atenolol 25 mg, lisinopril 10 mg BP Readings from Last 3 Encounters:  10/06/17 130/70  08/10/17 110/66  07/14/17 128/66  A/P: We discussed blood pressure goal of <140/90. Continue current meds

## 2017-10-06 NOTE — Assessment & Plan Note (Signed)
She is following up with Dr. Lake Bells each fall.  No recent worsening of symptoms

## 2017-10-06 NOTE — Progress Notes (Signed)
Subjective:  [name] is a 77 y.o. year old very pleasant female patient who presents for/with See problem oriented charting ROS- stable SOB, no chest pain.  Admits to fatigue.  Denies hypoglycemia.  Past Medical History-  Patient Active Problem List   Diagnosis Date Noted  . 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    Priority: High  . Bronchiectasis without acute exacerbation ([place]) 10/18/2015    Priority: High  . Diabetes mellitus type II, controlled ([place]) 12/28/2006    Priority: High  . Aortic atherosclerosis ([place]) 02/16/2016    Priority: Medium  . Solitary pulmonary nodule 01/08/2016    Priority: Medium  . IBS (irritable bowel syndrome) 07/17/2014    Priority: Medium  . Depression, major, single episode, complete remission ([place]) 01/03/2014    Priority: Medium  . Essential hypertension, benign 01/03/2014    Priority: Medium  . Fatty liver 03/18/2009    Priority: Medium  . Hyperlipidemia 07/26/2007    Priority: Medium  . ANEMIA, B12 DEFICIENCY 12/29/2006    Priority: Medium  . RESTLESS LEG SYNDROME, MILD 12/28/2006    Priority: Medium  . OSTEOPOROSIS 12/12/2006    Priority: Medium  . Acute diverticulitis 10/05/2015    Priority: Low  . CKD (chronic kidney disease), stage II 01/22/2014    Priority: Low  . Posterior tibial tendon dysfunction 01/03/2014    Priority: Low  . Osteoarthritis, knee 01/03/2014    Priority: Low  . Glaucoma 01/03/2014    Priority: Low  . Obesity (BMI 30-39.9) 06/15/2013    Priority: Low  . Foot tendinitis 07/20/2010    Priority: Low  . INSOMNIA, CHRONIC 07/25/2009    Priority: Low  . GERD 12/25/2007    Priority: Low  . NEUROPATHY, IDIOPATHIC PERIPHERAL NEC 12/28/2006    Priority: Low  . BREAST CANCER, HX OF 12/12/2006    Priority: Low  . History of adenomatous polyp of colon 05/18/2017  . Diarrhea   . Polyp of ascending colon   . Arthritis of midfoot 03/03/2016  . Hammertoes of both feet 03/03/2016  . OA (osteoarthritis) of knee 12/30/2014   . Primary open-angle glaucoma 08/02/2012    Medications- reviewed and updated Current Outpatient Medications  Medication Sig Dispense Refill  . ALPRAZolam (XANAX) 0.25 MG tablet Take 1 tablet (0.25 mg total) by mouth daily as needed for anxiety. 30 tablet 0  . atenolol (TENORMIN) 25 MG tablet TAKE ONE TABLET BY MOUTH ONCE DAILY 90 tablet 3  . budesonide-formoterol (SYMBICORT) 160-4.5 MCG/ACT inhaler Inhale 2 puffs into the lungs 2 (two) times daily.    . cetirizine (ZYRTEC) 10 MG tablet Take 10 mg by mouth daily.    . Cholecalciferol (VITAMIN D3) 2000 units capsule Take 2,000 Units by mouth daily.    . Coenzyme Q10 300 MG CAPS Take 300 mg by mouth daily.    . colesevelam (WELCHOL) 625 MG tablet Take 1 tablet (625 mg total) by mouth 3 (three) times daily. 90 tablet 2  . colestipol (COLESTID) 1 g tablet Take 2 tablets (2 g total) by mouth 2 (two) times daily. 120 tablet 0  . dorzolamide-timolol (COSOPT) 22.3-6.8 MG/ML ophthalmic solution Place 1 drop into both eyes 2 (two) times daily.     [name] ESTRING 2 MG vaginal ring 1 ring pv every 3 months 1 each 4  . Flaxseed, Linseed, (FLAX SEEDS PO) Take 1,400 mg by mouth daily.    [name] FLUoxetine (PROZAC) 40 MG capsule Take 1 capsule (40 mg total) by mouth daily. 90 capsule 3  . glimepiride (  AMARYL) 4 MG tablet TAKE ONE TABLET BY MOUTH ONCE DAILY WITH BREAKFAST 90 tablet 3  . glucose blood (FREESTYLE LITE) test strip USE TO CHECK BLOOD SUGAR DAILY AND AS NEEDED 50 each 11  . lisinopril (PRINIVIL,ZESTRIL) 10 MG tablet Take 1 tablet (10 mg total) by mouth daily. (Patient taking differently: Take 10 mg by mouth every evening. ) 90 tablet 3  . LUMIGAN 0.01 % SOLN Place 1 drop into both eyes at bedtime.     . Misc Natural Products (GRAPE SEED COMPLEX) CAPS Take 1 capsule by mouth daily.    . Multiple Vitamin (MULTIVITAMIN WITH MINERALS) TABS tablet Take 1 tablet by mouth daily.    . Omega-3 Fatty Acids (FISH OIL) 1200 MG CAPS Take 1,200 mg by mouth daily.    .  Polyvinyl Alcohol-Povidone (REFRESH OP) Apply 1 drop to eye daily as needed (dry eyes).    . pramipexole (MIRAPEX) 0.5 MG tablet TAKE ONE TABLET BY MOUTH AT BEDTIME 90 tablet 1  . Respiratory Therapy Supplies (FLUTTER) DEVI Use as directed 1 each 0  . Selenium 200 MCG CAPS Take 200 mcg by mouth daily.    . temazepam (RESTORIL) 15 MG capsule Take 2 capsules (30 mg total) by mouth at bedtime as needed for sleep. 60 capsule 5  . VICTOZA 18 MG/3ML SOPN INJECT 1.'8MG'$  INTO THE SKIN DAILY 9 pen 6  . vitamin B-12 (CYANOCOBALAMIN) 1000 MCG tablet Take 1,000 mcg by mouth daily.    [name] dicyclomine (BENTYL) 10 MG capsule Take 2 capsules (20 mg total) by mouth 4 (four) times daily as needed for spasms. 120 capsule 3   No current facility-administered medications for this visit.     Objective: BP 130/70 (BP Location: Left Arm, Patient Position: Sitting, Cuff Size: Large)   Pulse 87   Temp 99.2 F (37.3 C) (Oral)   Ht 6' (1.829 m)   Wt 245 lb (111.1 kg)   LMP 05/10/1992   SpO2 93%   BMI 33.23 kg/m  Gen: NAD, resting comfortably CV: RRR no murmurs rubs or gallops Lungs: CTAB no crackles, wheeze, rhonchi Abdomen: soft/nontender/overweight Ext: no edema Skin: warm, dry Neuro: Walks with walker Down mood  Results for orders placed or performed in visit on 10/06/17 (from the past 24 hour(s))  POCT Urinalysis Dipstick (Automated)     Status: Abnormal   Collection Time: 10/06/17  2:24 PM  Result Value Ref Range   Color, UA Yellow    Clarity, UA Clear    Glucose, UA Negative Negative   Bilirubin, UA Negative    Ketones, UA Negative    Spec Grav, UA >=1.030 (A) 1.010 - 1.025   Blood, UA Negative    pH, UA 5.0 5.0 - 8.0   Protein, UA Positive (A) Negative   Urobilinogen, UA 0.2 0.2 or 1.0 E.U./dL   Nitrite, UA Negative    Leukocytes, UA Moderate (2+) (A) Negative   Assessment/Plan:  Other notes: 1. IBS issues. Continue- doesn't tolerate metformin 2. In July sees ortho- encouraged her to be  seen sooner for her back.  Low back pain for a few months- seems to "give out" has done PT but not core  3.  Admits to some increased fatigue.  No chest pain or increased shortness of breath.  We will update labs as below.  Could be related to poorly controlled depression  Polyuria S: increased urination for 2 weeks. Mildest of burning. Not worsening.  A/P: UA with possible UTI.  We will get  urine culture given mild symptoms over 2 weeks and only treat if urine culture positive  Diabetes mellitus type II, controlled ([place]) S:  controlled on amaryl '4mg'$  and victoza. Doesn't do well with metformin due to GI/IBS issues.  Lab Results  Component Value Date   HGBA1C 6.1 02/01/2017   HGBA1C 6.9 (H) 10/21/2016   HGBA1C 6.3 07/23/2016   A/P: Update A1c-continue current medications  ANEMIA, B12 DEFICIENCY S:  she remains on b12 injections on monthly basis A/P: update b12 today Lab Results  Component Value Date   VITAMINB12 286 10/21/2016     Depression, major, single episode, complete remission ([place]) S: phq9 of last visit at 32- now it is 75. Stress having to find adoptive home for her grandson who they are caring for now but know they cannot long term. We tried to change rx and didn't help much- now on prozac '40mg'$  A/P: Mild poor control today.  Multiple stressors particularly finding a long-term home for her grandson.  She does not want to increase or change medications.  We discussed adding counseling through [facility]-she is considering  Essential hypertension, benign S: controlled on atenolol 25 mg, lisinopril 10 mg BP Readings from Last 3 Encounters:  10/06/17 130/70  08/10/17 110/66  07/14/17 128/66  A/P: We discussed blood pressure goal of <140/90. Continue current meds   Future Appointments  Date Time Provider [place]  10/18/2017  3:00 PM Megan Salon, MD [place] None  10/25/2017  2:30 PM Mauri Pole, MD [facility]  12/12/2017  2:30 PM  Yong Channel Brayton Mars, MD [facility]   We agreed to 68-monthfollow-up due to fatigue she is experiencing and depressed mood  Lab/Order associations: Urinary frequency - Plan: POCT Urinalysis Dipstick (Automated), Urine Culture  Controlled type 2 diabetes mellitus without complication, without long-term current use of insulin ([place] - Plan: HgB A1c  ANEMIA, B12 DEFICIENCY - Plan: B12  Fatigue, unspecified type - Plan: CBC, Comp Met (CMET), TSH, TSH  Return precautions advised.  SGarret Reddish MD

## 2017-10-06 NOTE — Assessment & Plan Note (Signed)
Continue risk factor modification.  Blood pressure controlled.  Lipids have been: Controlled without medicine.  Could update lipids soon

## 2017-10-07 LAB — COMPREHENSIVE METABOLIC PANEL
ALBUMIN: 4.1 g/dL (ref 3.5–5.2)
ALK PHOS: 77 U/L (ref 39–117)
ALT: 26 U/L (ref 0–35)
AST: 20 U/L (ref 0–37)
BUN: 12 mg/dL (ref 6–23)
CALCIUM: 9.7 mg/dL (ref 8.4–10.5)
CO2: 30 mEq/L (ref 19–32)
CREATININE: 0.67 mg/dL (ref 0.40–1.20)
Chloride: 101 mEq/L (ref 96–112)
GFR: 90.54 mL/min (ref >60.00)
Glucose, Bld: 127 mg/dL — ABNORMAL HIGH (ref 70–99)
Potassium: 4.9 mEq/L (ref 3.5–5.1)
Sodium: 141 mEq/L (ref 135–145)
TOTAL PROTEIN: 6.8 g/dL (ref 6.0–8.3)
Total Bilirubin: 0.4 mg/dL (ref 0.2–1.2)

## 2017-10-07 LAB — CBC
HCT: 41.6 % (ref 36.0–46.0)
Hemoglobin: 13.7 g/dL (ref 12.0–15.0)
MCHC: 32.8 g/dL (ref 30.0–36.0)
MCV: 94.3 fl (ref 78.0–100.0)
PLATELETS: 280 10*3/uL (ref 150.0–400.0)
RBC: 4.42 Mil/uL (ref 3.87–5.11)
RDW: 15.1 % (ref 11.5–15.5)
WBC: 8.9 10*3/uL (ref 4.0–10.5)

## 2017-10-07 LAB — VITAMIN B12: Vitamin B-12: 707 pg/mL (ref 211–911)

## 2017-10-07 LAB — HEMOGLOBIN A1C: HEMOGLOBIN A1C: 6.3 % (ref 4.6–6.5)

## 2017-10-07 LAB — TSH: TSH: 2.14 u[IU]/mL (ref 0.35–4.50)

## 2017-10-08 LAB — URINE CULTURE
MICRO NUMBER:: 90652529
SPECIMEN QUALITY: ADEQUATE

## 2017-10-08 NOTE — Progress Notes (Signed)
Your CBC was normal (blood counts, infection fighting cells, platelets). Your CMET was normal (kidney, liver, and electrolytes, blood sugar) for someone with diabetes  your B12 was normal Your thyroid was normal. Your hemoglobin A1c remains well controlled at 6.3

## 2017-10-18 ENCOUNTER — Ambulatory Visit: Payer: Medicare Other | Admitting: Obstetrics & Gynecology

## 2017-10-21 ENCOUNTER — Encounter: Payer: Self-pay | Admitting: Obstetrics & Gynecology

## 2017-10-21 ENCOUNTER — Ambulatory Visit (INDEPENDENT_AMBULATORY_CARE_PROVIDER_SITE_OTHER): Payer: Medicare Other | Admitting: Obstetrics & Gynecology

## 2017-10-21 ENCOUNTER — Encounter: Payer: Self-pay | Admitting: Family Medicine

## 2017-10-21 VITALS — BP 124/60 | HR 92 | Resp 16 | Ht 72.0 in | Wt 243.0 lb

## 2017-10-21 DIAGNOSIS — N952 Postmenopausal atrophic vaginitis: Secondary | ICD-10-CM

## 2017-10-21 DIAGNOSIS — N898 Other specified noninflammatory disorders of vagina: Secondary | ICD-10-CM

## 2017-10-21 MED ORDER — FLUCONAZOLE 150 MG PO TABS: ORAL_TABLET | ORAL | 0 refills | Status: DC

## 2017-10-21 NOTE — Progress Notes (Signed)
GYNECOLOGY  VISIT  CC:   estring removal/replacement  HPI: 77 y.o. G0P0 Married Caucasian female here for estring placement and removal or Estring.  Reports she's been experiencing some vaginal and vulvar itching.  She did use some Diflucan she had at home without any improvement in symptoms.  Denies significant discharge.  Denies vaginal bleeding.  Has no new urinary symptoms.    GYNECOLOGIC HISTORY: Patient's last menstrual period was 05/10/1992. Contraception: post menopausal  Menopausal hormone therapy: none  Patient Active Problem List   Diagnosis Date Noted  . History of adenomatous polyp of colon 05/18/2017  . Diarrhea   . Polyp of ascending colon   . Arthritis of midfoot 03/03/2016  . Hammertoes of both feet 03/03/2016  . Aortic atherosclerosis ([place]) 02/16/2016  . Solitary pulmonary nodule 01/08/2016  . 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  . Bronchiectasis without acute exacerbation ([place]) 10/18/2015  . Acute diverticulitis 10/05/2015  . OA (osteoarthritis) of knee 12/30/2014  . IBS (irritable bowel syndrome) 07/17/2014  . CKD (chronic kidney disease), stage II 01/22/2014  . Depression, major, single episode, complete remission ([place]) 01/03/2014  . Posterior tibial tendon dysfunction 01/03/2014  . Osteoarthritis, knee 01/03/2014  . Glaucoma 01/03/2014  . Essential hypertension, benign 01/03/2014  . Obesity (BMI 30-39.9) 06/15/2013  . Primary open-angle glaucoma 08/02/2012  . Foot tendinitis 07/20/2010  . INSOMNIA, CHRONIC 07/25/2009  . Fatty liver 03/18/2009  . GERD 12/25/2007  . Hyperlipidemia 07/26/2007  . ANEMIA, B12 DEFICIENCY 12/29/2006  . Diabetes mellitus type II, controlled ([place]) 12/28/2006  . RESTLESS LEG SYNDROME, MILD 12/28/2006  . NEUROPATHY, IDIOPATHIC PERIPHERAL NEC 12/28/2006  . OSTEOPOROSIS 12/12/2006  . BREAST CANCER, HX OF 12/12/2006    Past Medical History:  Diagnosis Date  . Allergy   . Anemia   . Anxiety   . Arthritis    right knee;injections  every 32months   . Asthma   . Bronchiectasis ([place])   . Cancer (H[facility])    HX BREAST CANCER  . COMPRESSION FRACTURE, LUMBAR VERTEBRAE 08/21/2008   Qualifier: Diagnosis of  By: Arnoldo Morale MD, Balinda Quails   . Depression   . Diabetes mellitus    takes Amaryl and Januvia daily  . Difficulty sleeping   . Diverticulitis   . Early cataracts, bilateral   . Eczema   . Endometriosis   . Gastritis   . Glaucoma   . Hammer toe   . Headache(784.0)    Migraines  . Hypertension    takes Atenolol daily  . IBS (irritable bowel syndrome)   . Joint pain   . Kidney stone   . Neuropathy   . Open wound of second toe of left foot    at pre-op appt, wound appears clear of infection 1 month post removal of toenail, no obvious exudate present nor any redness,  . Osteomyelitis ([place])    left 2nd toe  . Osteopenia   . PONV (postoperative nausea and vomiting)   . Posterior tibial tendon dysfunction    left foot  . Scoliosis   . Severe esophageal dysplasia   . 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 Thyroid disease   . Vitamin D deficiency    takes Vit d daily    Past Surgical History:  Procedure Laterality Date  . ADENOIDECTOMY     at age 77  . biopsy on back     benign  . CATARACT EXTRACTION    . CHOLECYSTECTOMY  06/2008  . COLONOSCOPY  11/02/05  .  COLONOSCOPY WITH PROPOFOL N/A 05/17/2017   Procedure: COLONOSCOPY WITH PROPOFOL;  Surgeon: Mauri Pole, MD;  Location: [facility];  Service: Endoscopy;  Laterality: N/A;  PT WILL BE ADMITTED THE DAY BEFORE FOR PREP PER ROBIN KB  . DILATION AND CURETTAGE OF UTERUS    . ENDOMETRIAL BIOPSY  06/22/2011   benign polyp, no hyperplasia  . excision on breast     internal infected suture from breast surgery  . excision removed from neck     infected lymph node  . FOOT SURGERY     left foot-cleaning out areas on toes at the wound center-having MRI to determine if osteomyelitis  . HYSTEROSCOPY  06/22/11   PMB submucosal  myoma  . JOINT REPLACEMENT     left total shoulder  . LAPAROSCOPIC OOPHORECTOMY Right 12/2004   absent LSO  . LAPAROTOMY    . MASTECTOMY Bilateral 1994 right, 1995 left  . SHOULDER HEMI-ARTHROPLASTY  01/01/2012   Procedure: SHOULDER HEMI-ARTHROPLASTY;  Surgeon: Roseanne Kaufman, MD;  Location: [facility];  Service: Orthopedics;  Laterality: Left;  Left Shoulder Hemi Arthroplasty with Repair and Reconstruction as Necessary   . THYROIDECTOMY     3yrs ago. follows endocrine  . TONSILLECTOMY    . TOTAL KNEE ARTHROPLASTY Right 12/30/2014   Procedure: RIGHT TOTAL KNEE ARTHROPLASTY;  Surgeon: Gaynelle Arabian, MD;  Location: [facility];  Service: Orthopedics;  Laterality: Right;  . TOTAL KNEE ARTHROPLASTY Left 11/29/2016   Procedure: LEFT TOTAL KNEE ARTHROPLASTY;  Surgeon: Gaynelle Arabian, MD;  Location: [facility];  Service: Orthopedics;  Laterality: Left;    MEDS:   Current Outpatient Medications on File Prior to Visit  Medication Sig Dispense Refill  . ALPRAZolam (XANAX) 0.25 MG tablet Take 1 tablet (0.25 mg total) by mouth daily as needed for anxiety. 30 tablet 0  . atenolol (TENORMIN) 25 MG tablet TAKE ONE TABLET BY MOUTH ONCE DAILY 90 tablet 3  . budesonide-formoterol (SYMBICORT) 160-4.5 MCG/ACT inhaler Inhale 2 puffs into the lungs 2 (two) times daily.    . cetirizine (ZYRTEC) 10 MG tablet Take 10 mg by mouth daily.    . Cholecalciferol (VITAMIN D3) 2000 units capsule Take 2,000 Units by mouth daily.    . Coenzyme Q10 300 MG CAPS Take 300 mg by mouth daily.    . colesevelam (WELCHOL) 625 MG tablet Take 1 tablet (625 mg total) by mouth 3 (three) times daily. 90 tablet 2  . colestipol (COLESTID) 1 g tablet Take 2 tablets (2 g total) by mouth 2 (two) times daily. 120 tablet 0  . dorzolamide-timolol (COSOPT) 22.3-6.8 MG/ML ophthalmic solution Place 1 drop into both eyes 2 (two) times daily.     [name] ESTRING 2 MG vaginal ring 1 ring pv every 3 months 1 each 4  . Flaxseed, Linseed, (FLAX SEEDS PO) Take 1,400 mg by  mouth daily.    [name] FLUoxetine (PROZAC) 40 MG capsule Take 1 capsule (40 mg total) by mouth daily. 90 capsule 3  . glimepiride (AMARYL) 4 MG tablet TAKE ONE TABLET BY MOUTH ONCE DAILY WITH BREAKFAST 90 tablet 3  . glucose blood (FREESTYLE LITE) test strip USE TO CHECK BLOOD SUGAR DAILY AND AS NEEDED 50 each 11  . lisinopril (PRINIVIL,ZESTRIL) 10 MG tablet Take 1 tablet (10 mg total) by mouth daily. (Patient taking differently: Take 10 mg by mouth every evening. ) 90 tablet 3  . LUMIGAN 0.01 % SOLN Place 1 drop into both eyes at bedtime.     . Misc Natural Products (  GRAPE SEED COMPLEX) CAPS Take 1 capsule by mouth daily.    . Multiple Vitamin (MULTIVITAMIN WITH MINERALS) TABS tablet Take 1 tablet by mouth daily.    . Omega-3 Fatty Acids (FISH OIL) 1200 MG CAPS Take 1,200 mg by mouth daily.    . Polyvinyl Alcohol-Povidone (REFRESH OP) Apply 1 drop to eye daily as needed (dry eyes).    . pramipexole (MIRAPEX) 0.5 MG tablet TAKE ONE TABLET BY MOUTH AT BEDTIME 90 tablet 1  . Respiratory Therapy Supplies (FLUTTER) DEVI Use as directed 1 each 0  . Selenium 200 MCG CAPS Take 200 mcg by mouth daily.    . temazepam (RESTORIL) 15 MG capsule Take 2 capsules (30 mg total) by mouth at bedtime as needed for sleep. 60 capsule 5  . VICTOZA 18 MG/3ML SOPN INJECT 1.8MG  INTO THE SKIN DAILY 9 pen 6  . vitamin B-12 (CYANOCOBALAMIN) 1000 MCG tablet Take 1,000 mcg by mouth daily.    [name] dicyclomine (BENTYL) 10 MG capsule Take 2 capsules (20 mg total) by mouth 4 (four) times daily as needed for spasms. 120 capsule 3   No current facility-administered medications on file prior to visit.     ALLERGIES: Cephalexin; Erythromycin ethylsuccinate; Levaquin [levofloxacin in d5w]; Nitrofurantoin; and Percocet [oxycodone-acetaminophen]  Family History  Problem Relation Age of Onset  . Arthritis Mother   . COPD Father   . Heart disease Father        MI 35  . Hypertension Father   . Hyperlipidemia Father   . Pancreatic cancer  Paternal Grandfather   . Colon cancer Neg Hx   . Esophageal cancer Neg Hx   . Rectal cancer Neg Hx   . Stomach cancer Neg Hx     SH:  Married, non smoker  Review of Systems  All other systems reviewed and are negative.   PHYSICAL EXAMINATION:    BP 124/60 (BP Location: Right Arm, Patient Position: Sitting, Cuff Size: Large)   Pulse 92   Resp 16   Ht 6' (1.829 m)   Wt 243 lb (110.2 kg)   LMP 05/10/1992   BMI 32.96 kg/m     General appearance: alert, cooperative and appears stated age Lymph:  No inguinal LAD  Pelvic: External genitalia:  no lesions              Urethra:  normal appearing urethra with no masses, tenderness or lesions              Bartholins and Skenes: normal                 Vagina: normal appearing vagina with normal color and discharge, no lesions.               Cervix: no lesions              Bimanual Exam:  Uterus:  normal size, contour, position, consistency, mobility, non-tender              Adnexa: no mass, fullness, tenderness  Estring removed and then new one was placed vaginally and without difficulty.  Chaperone was present for exam.  Assessment: Vaginal atrophic changes, using estring that is removed and replaced every 3 months Vaginal, vulvar itching  Plan: Affirm pending RF for diflucan 150mg  po x 1, repeat 72 hours Estring placed today.  Return 3 months.

## 2017-10-22 LAB — VAGINITIS/VAGINOSIS, DNA PROBE
Candida Species: NEGATIVE
Gardnerella vaginalis: NEGATIVE
TRICHOMONAS VAG: NEGATIVE

## 2017-10-24 ENCOUNTER — Telehealth: Payer: Self-pay | Admitting: Obstetrics & Gynecology

## 2017-10-24 NOTE — Telephone Encounter (Signed)
Spoke with patient, advised as seen below per Dr. Miller. Patient verbalizes understanding and is agreeable.   Encounter closed.  

## 2017-10-24 NOTE — Telephone Encounter (Signed)
She could also try OTC aquaphor to see if this will help.  Can use externally instead of the coconut oil.

## 2017-10-24 NOTE — Telephone Encounter (Signed)
Spoke with patient. Advised 10/21/17 vaginitis testing negative for BV, yeast and trichomonas.   1.  vaginal itching has not resolved, RX for diflucan provided on 10/21/17, did not start medication.    2. Estring placed on 10/21/17. Patient states Estring is in place, feels more uncomfortable than previous uses.    Advised Dr. Sabra Heck will review results, I will return call with additional recommendations, patient agreeable.   Dr. Sabra Heck -please review and advise?

## 2017-10-24 NOTE — Telephone Encounter (Signed)
Spoke with patient, advised as seen below per Dr. Sabra Heck. Patient states with her hx of breast cancer would prefer not to use estrace at this time. No rx sent. Patient is going to try coconut oil externally and allow more time for the estring.   Advised patient to return call to office if symptoms do not resolve, or if she desires Rx. Patient verbalizes understanding.

## 2017-10-24 NOTE — Telephone Encounter (Signed)
Patient calling regarding results from 10/21/17 appointment.

## 2017-10-24 NOTE — Telephone Encounter (Signed)
As the BV and yeast testing was negative and she does not have risk for STD's, the only other option is lack of estrogen (which is why she is using the estring).  Ok to use estrace vaginal cream 1 gm pv and a small amount externally twice weekly.  Ok to send into pharmacy.   May need to use for a few weeks before symptoms really resolve.  Ok to recheck 1 month if still symptomatic.

## 2017-10-25 ENCOUNTER — Encounter: Payer: Self-pay | Admitting: Gastroenterology

## 2017-10-25 ENCOUNTER — Ambulatory Visit (INDEPENDENT_AMBULATORY_CARE_PROVIDER_SITE_OTHER): Payer: Medicare Other | Admitting: Gastroenterology

## 2017-10-25 VITALS — BP 122/70 | HR 88 | Ht 71.0 in | Wt 243.0 lb

## 2017-10-25 DIAGNOSIS — K580 Irritable bowel syndrome with diarrhea: Secondary | ICD-10-CM

## 2017-10-25 DIAGNOSIS — K219 Gastro-esophageal reflux disease without esophagitis: Secondary | ICD-10-CM | POA: Diagnosis not present

## 2017-10-25 NOTE — Progress Notes (Signed)
[name]    [id number]    1940/06/24  Primary Care Physician:Hunter, Brayton Mars, MD  Referring Physician: Marin Olp, MD [facility] [place], [place] [id number]  Chief complaint: Irritable bowel syndrome-diarrhea  HPI:  77 year old female with history of obesity, type 2 diabetes, hypertension with irritable bowel syndrome predominant diarrhea here for follow-up visit.  Patient is having significant improvement of her symptoms with Citrucel, Creon and WelChol.  She is taking dicyclomine as needed.  She is no longer having frequent episodes of fecal urgency or incontinence.  Doing well overall.  She is trying to improve her stamina and exercise. .    Outpatient Encounter Medications as of 10/25/2017  Medication Sig  . ALPRAZolam (XANAX) 0.25 MG tablet Take 1 tablet (0.25 mg total) by mouth daily as needed for anxiety.  [name] atenolol (TENORMIN) 25 MG tablet TAKE ONE TABLET BY MOUTH ONCE DAILY  . budesonide-formoterol (SYMBICORT) 160-4.5 MCG/ACT inhaler Inhale 2 puffs into the lungs 2 (two) times daily.  . cetirizine (ZYRTEC) 10 MG tablet Take 10 mg by mouth daily.  . Cholecalciferol (VITAMIN D3) 2000 units capsule Take 2,000 Units by mouth daily.  . Coenzyme Q10 300 MG CAPS Take 300 mg by mouth daily.  . colesevelam (WELCHOL) 625 MG tablet Take 1 tablet (625 mg total) by mouth 3 (three) times daily.  . colestipol (COLESTID) 1 g tablet Take 2 tablets (2 g total) by mouth 2 (two) times daily.  . dorzolamide-timolol (COSOPT) 22.3-6.8 MG/ML ophthalmic solution Place 1 drop into both eyes 2 (two) times daily.   [name] ESTRING 2 MG vaginal ring 1 ring pv every 3 months  . Flaxseed, Linseed, (FLAX SEEDS PO) Take 1,400 mg by mouth daily.  . fluconazole (DIFLUCAN) 150 MG tablet Take one tab and repeat in 72 hours if needed.  [name] FLUoxetine (PROZAC) 40 MG capsule Take 1 capsule (40 mg total) by mouth daily.  [name] glimepiride (AMARYL) 4 MG tablet TAKE ONE TABLET BY MOUTH ONCE  DAILY WITH BREAKFAST  . glucose blood (FREESTYLE LITE) test strip USE TO CHECK BLOOD SUGAR DAILY AND AS NEEDED  . lisinopril (PRINIVIL,ZESTRIL) 10 MG tablet Take 1 tablet (10 mg total) by mouth daily. (Patient taking differently: Take 10 mg by mouth every evening. )  . LUMIGAN 0.01 % SOLN Place 1 drop into both eyes at bedtime.   . Methylcellulose, Laxative, (CITRUCEL PO) Take 3 tablets by mouth at bedtime.  . Misc Natural Products (GRAPE SEED COMPLEX) CAPS Take 1 capsule by mouth daily.  . Multiple Vitamin (MULTIVITAMIN WITH MINERALS) TABS tablet Take 1 tablet by mouth daily.  . Omega-3 Fatty Acids (FISH OIL) 1200 MG CAPS Take 1,200 mg by mouth daily.  . Polyvinyl Alcohol-Povidone (REFRESH OP) Apply 1 drop to eye daily as needed (dry eyes).  . pramipexole (MIRAPEX) 0.5 MG tablet TAKE ONE TABLET BY MOUTH AT BEDTIME  . Respiratory Therapy Supplies (FLUTTER) DEVI Use as directed  . Selenium 200 MCG CAPS Take 200 mcg by mouth daily.  . temazepam (RESTORIL) 15 MG capsule Take 2 capsules (30 mg total) by mouth at bedtime as needed for sleep.  [name] VICTOZA 18 MG/3ML SOPN INJECT 1.8MG  INTO THE SKIN DAILY  . vitamin B-12 (CYANOCOBALAMIN) 1000 MCG tablet Take 1,000 mcg by mouth daily.  [name] dicyclomine (BENTYL) 10 MG capsule Take 2 capsules (20 mg total) by mouth 4 (four) times daily as needed for spasms.   No facility-administered encounter  medications on file as of 10/25/2017.     Allergies as of 10/25/2017 - Review Complete 10/25/2017  Allergen Reaction Noted  . Cephalexin Diarrhea 10/14/2006  . Erythromycin ethylsuccinate Hives and Diarrhea 10/14/2006  . Levaquin [levofloxacin in d5w] Other (See Comments) 12/18/2014  . Nitrofurantoin Other (See Comments) 10/14/2006  . Percocet [oxycodone-acetaminophen] Itching 12/31/2011    Past Medical History:  Diagnosis Date  . Allergy   . Anemia   . Anxiety   . Arthritis    right knee;injections every 80months   . Asthma   . Bronchiectasis ([place])   .  Cancer (HCC)    HX BREAST CANCER  . COMPRESSION FRACTURE, LUMBAR VERTEBRAE 08/21/2008   Qualifier: Diagnosis of  By: Arnoldo Morale MD, Balinda Quails   . Depression   . Diabetes mellitus    takes Amaryl and Januvia daily  . Difficulty sleeping   . Diverticulitis   . Early cataracts, bilateral   . Eczema   . Endometriosis   . Gastritis   . Glaucoma   . Hammer toe   . Headache(784.0)    Migraines  . Hypertension    takes Atenolol daily  . IBS (irritable bowel syndrome)   . Joint pain   . Kidney stone   . Neuropathy   . Open wound of second toe of left foot    at pre-op appt, wound appears clear of infection 1 month post removal of toenail, no obvious exudate present nor any redness,  . Osteomyelitis ([place])    left 2nd toe  . Osteopenia   . PONV (postoperative nausea and vomiting)   . Posterior tibial tendon dysfunction    left foot  . Scoliosis   . Severe esophageal dysplasia   . 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 Thyroid disease   . Vitamin D deficiency    takes Vit d daily    Past Surgical History:  Procedure Laterality Date  . ADENOIDECTOMY     at age 25  . biopsy on back     benign  . CATARACT EXTRACTION    . CHOLECYSTECTOMY  06/2008  . COLONOSCOPY  11/02/05  . COLONOSCOPY WITH PROPOFOL N/A 05/17/2017   Procedure: COLONOSCOPY WITH PROPOFOL;  Surgeon: Mauri Pole, MD;  Location: [facility];  Service: Endoscopy;  Laterality: N/A;  PT WILL BE ADMITTED THE DAY BEFORE FOR PREP PER ROBIN KB  . DILATION AND CURETTAGE OF UTERUS    . ENDOMETRIAL BIOPSY  06/22/2011   benign polyp, no hyperplasia  . excision on breast     internal infected suture from breast surgery  . excision removed from neck     infected lymph node  . FOOT SURGERY     left foot-cleaning out areas on toes at the wound center-having MRI to determine if osteomyelitis  . HYSTEROSCOPY  06/22/11   PMB submucosal myoma  . JOINT REPLACEMENT     left total shoulder  .  LAPAROSCOPIC OOPHORECTOMY Right 12/2004   absent LSO  . LAPAROTOMY    . MASTECTOMY Bilateral 1994 right, 1995 left  . SHOULDER HEMI-ARTHROPLASTY  01/01/2012   Procedure: SHOULDER HEMI-ARTHROPLASTY;  Surgeon: Roseanne Kaufman, MD;  Location: [facility];  Service: Orthopedics;  Laterality: Left;  Left Shoulder Hemi Arthroplasty with Repair and Reconstruction as Necessary   . THYROIDECTOMY     29yrs ago. follows endocrine  . TONSILLECTOMY    . TOTAL KNEE ARTHROPLASTY Right 12/30/2014   Procedure: RIGHT TOTAL KNEE ARTHROPLASTY;  Surgeon: Gaynelle Arabian, MD;  Location: [facility];  Service: Orthopedics;  Laterality: Right;  . TOTAL KNEE ARTHROPLASTY Left 11/29/2016   Procedure: LEFT TOTAL KNEE ARTHROPLASTY;  Surgeon: Gaynelle Arabian, MD;  Location: [facility];  Service: Orthopedics;  Laterality: Left;    Family History  Problem Relation Age of Onset  . Arthritis Mother   . COPD Father   . Heart disease Father        MI 42  . Hypertension Father   . Hyperlipidemia Father   . Pancreatic cancer Paternal Grandfather   . Colon cancer Neg Hx   . Esophageal cancer Neg Hx   . Rectal cancer Neg Hx   . Stomach cancer Neg Hx     Social History   Socioeconomic History  . Marital status: Married    Spouse name: Not on file  . Number of children: Not on file  . Years of education: Not on file  . Highest education level: Not on file  Occupational History  . Not on file  Social Needs  . Financial resource strain: Not on file  . Food insecurity:    Worry: Not on file    Inability: Not on file  . Transportation needs:    Medical: Not on file    Non-medical: Not on file  Tobacco Use  . Smoking status: Never Smoker  . Smokeless tobacco: Never Used  Substance and Sexual Activity  . Alcohol use: No  . Drug use: No  . Sexual activity: Not on file  Lifestyle  . Physical activity:    Days per week: Not on file    Minutes per session: Not on file  . Stress: Not on file  Relationships  . Social connections:      Talks on phone: Not on file    Gets together: Not on file    Attends religious service: Not on file    Active member of club or organization: Not on file    Attends meetings of clubs or organizations: Not on file    Relationship status: Not on file  . Intimate partner violence:    Fear of current or ex partner: Not on file    Emotionally abused: Not on file    Physically abused: Not on file    Forced sexual activity: Not on file  Other Topics Concern  . Not on file  Social History Narrative   Married 39 years in 2015. 1 adopted son. 1 grandkid (32 yo grandson)      Retired from Development worker, international aid at [employer]: fashion, tv shopping, sewing, decorate      Review of systems: Review of Systems  Constitutional: Negative for fever and chills. Positive for energy HENT: Negative.   Eyes: Negative for blurred vision.  Respiratory: Negative for cough,  and wheezing.  Positive for shortness of breath Cardiovascular: Negative for chest pain and palpitations.  Gastrointestinal: as per HPI Genitourinary: Negative for dysuria, urgency, frequency and hematuria.  Musculoskeletal: Positive for myalgias, back pain and joint pain.  Skin: Negative for itching and rash.  Neurological: Negative for dizziness, tremors, focal weakness, seizures and loss of consciousness.  Endo/Heme/Allergies: Positive for seasonal allergies.  Psychiatric/Behavioral: Negative for suicidal ideas and hallucinations.  Positive for depression and insomnia All other systems reviewed and are negative.   Physical Exam: Vitals:   10/25/17 1432  BP: 122/70  Pulse: 88   Body mass index is 33.89 kg/m. Gen:      No acute  distress HEENT:  EOMI, sclera anicteric Neck:     No masses; no thyromegaly Lungs:    Clear to auscultation bilaterally; normal respiratory effort CV:         Regular rate and rhythm; no murmurs Abd:      + bowel sounds; soft, non-tender; no palpable masses, no distension Ext:    No edema; adequate  peripheral perfusion Skin:      Warm and dry; no rash Neuro: alert and oriented x 3 Psych: normal mood and affect  Data Reviewed:  Reviewed labs, radiology imaging, old records and pertinent past GI work up   Assessment and Plan/Recommendations:  77 year old female with obesity, hypertension, type 2 diabetes, GERD with irritable bowel syndrome predominant diarrhea Significant improvement of symptoms with pancreatic enzyme replacement and will call Continue Creon with meals and Welchol 3 times daily with meals Avoid soda and fruit juices with high fructose corn syrup Continue probiotic daily GERD symptoms stable continue antireflux measures Return in 6 months or sooner if needed 25 minutes was spent face-to-face with the patient. Greater than 50% of the time used for counseling as well as treatment plan and follow-up. She had multiple questions which were answered to her satisfaction  K. Denzil Magnuson , MD [phone]    CC: Marin Olp, MD

## 2017-10-25 NOTE — Telephone Encounter (Signed)
Patient has not heard from [facility] but she said she is much better and does not need the referral now, Patient seen Dr Silverio Decamp in the office today

## 2017-10-25 NOTE — Patient Instructions (Signed)
Continue current meds  Use Align daily as needed  Follow up in 6 months  If you are age 78 or older, your body mass index should be between 23-30. Your Body mass index is 33.89 kg/m. If this is out of the aforementioned range listed, please consider follow up with your Primary Care Provider.  If you are age 27 or younger, your body mass index should be between 19-25. Your Body mass index is 33.89 kg/m. If this is out of the aformentioned range listed, please consider follow up with your Primary Care Provider.

## 2017-10-26 ENCOUNTER — Other Ambulatory Visit: Payer: Self-pay | Admitting: Family Medicine

## 2017-11-02 ENCOUNTER — Encounter: Payer: Self-pay | Admitting: Gastroenterology

## 2017-11-03 ENCOUNTER — Other Ambulatory Visit: Payer: Self-pay | Admitting: Gastroenterology

## 2017-11-14 ENCOUNTER — Other Ambulatory Visit: Payer: Self-pay | Admitting: Gastroenterology

## 2017-11-14 DIAGNOSIS — Z96653 Presence of artificial knee joint, bilateral: Secondary | ICD-10-CM | POA: Insufficient documentation

## 2017-12-07 DIAGNOSIS — I739 Peripheral vascular disease, unspecified: Secondary | ICD-10-CM | POA: Diagnosis not present

## 2017-12-07 DIAGNOSIS — E1151 Type 2 diabetes mellitus with diabetic peripheral angiopathy without gangrene: Secondary | ICD-10-CM | POA: Diagnosis not present

## 2017-12-07 DIAGNOSIS — L84 Corns and callosities: Secondary | ICD-10-CM | POA: Diagnosis not present

## 2017-12-07 DIAGNOSIS — L603 Nail dystrophy: Secondary | ICD-10-CM | POA: Diagnosis not present

## 2017-12-12 ENCOUNTER — Ambulatory Visit: Payer: Medicare Other | Admitting: Family Medicine

## 2017-12-15 DIAGNOSIS — M170 Bilateral primary osteoarthritis of knee: Secondary | ICD-10-CM | POA: Diagnosis not present

## 2017-12-29 ENCOUNTER — Ambulatory Visit (INDEPENDENT_AMBULATORY_CARE_PROVIDER_SITE_OTHER): Payer: Medicare Other | Admitting: Family Medicine

## 2017-12-29 ENCOUNTER — Encounter: Payer: Self-pay | Admitting: Family Medicine

## 2017-12-29 VITALS — BP 132/82 | HR 87 | Temp 98.5°F | Ht 71.0 in | Wt 245.0 lb

## 2017-12-29 DIAGNOSIS — E119 Type 2 diabetes mellitus without complications: Secondary | ICD-10-CM | POA: Diagnosis not present

## 2017-12-29 DIAGNOSIS — F324 Major depressive disorder, single episode, in partial remission: Secondary | ICD-10-CM

## 2017-12-29 DIAGNOSIS — I10 Essential (primary) hypertension: Secondary | ICD-10-CM | POA: Diagnosis not present

## 2017-12-29 LAB — HEMOGLOBIN A1C: Hgb A1c MFr Bld: 5.6 (ref 4.0–6.0)

## 2017-12-29 MED ORDER — GLIMEPIRIDE 2 MG PO TABS: 2.0000 mg | ORAL_TABLET | Freq: Every day | ORAL | 3 refills | Status: DC

## 2017-12-29 MED ORDER — TEMAZEPAM 15 MG PO CAPS: 30.0000 mg | ORAL_CAPSULE | Freq: Every evening | ORAL | 5 refills | Status: DC | PRN

## 2017-12-29 NOTE — Patient Instructions (Addendum)
Health Maintenance Due  Topic Date Due  . FOOT EXAM - Today at office visit (jamie can you complete please?) or set her up for awv 07/17/2015  . TETANUS/TDAP - Informed patient to have it done at her pharmacy. 12/24/2017  . INFLUENZA VACCINE - Please Schedule an appointment in October 12/08/2017   . I would also like for you to sign up for an annual wellness visit with one of our nurses, Cassie or Manuela Schwartz, who both specialize in the annual wellness visit. This is a free benefit under medicare that may help [name] find additional ways to help you. Some highlights are reviewing medications, lifestyle, and doing a dementia screen.    . Please check with your pharmacy to see if they have the shingrix vaccine. If they do- please get this immunization and update [name] by phone call or mychart with dates you receive the vaccine.    Reduce glimepiride to 2 mg Lab Results  Component Value Date   HGBA1C 5.6 12/29/2017  follow up in 4 months or sooner if you want to chat more  I think counseling could be helpful for you- please strongly consider  I sent in temazepam for 6 months.

## 2017-12-29 NOTE — Assessment & Plan Note (Signed)
S: very well controlled on  amaryl 4 mg and victoza -doesn't tolerate metformin due to GI issues Exercise and diet- not exercising Lab Results  Component Value Date   HGBA1C 5.6 12/29/2017   HGBA1C 6.3 10/06/2017   HGBA1C 6.1 02/01/2017   A/P: a1c down to 5.6 today- will reduce amaryl to 2mg   As long as a1c under 6.5

## 2017-12-29 NOTE — Progress Notes (Signed)
Subjective:  [name] is a 77 y.o. year old very pleasant female patient who presents for/with See problem oriented charting ROS- no SI. Some anhedonia  And depressed mood. No chest pain or shortness of breath. No headache or new blurry vision.   Past Medical History-  Patient Active Problem List   Diagnosis Date Noted  . 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    Priority: High  . Bronchiectasis without acute exacerbation ([place]) 10/18/2015    Priority: High  . Diabetes mellitus type II, controlled ([place]) 12/28/2006    Priority: High  . Aortic atherosclerosis ([place]) 02/16/2016    Priority: Medium  . Solitary pulmonary nodule 01/08/2016    Priority: Medium  . IBS (irritable bowel syndrome) 07/17/2014    Priority: Medium  . Depression, major, single episode, in partial remission ([place]) 01/03/2014    Priority: Medium  . Essential hypertension, benign 01/03/2014    Priority: Medium  . Fatty liver 03/18/2009    Priority: Medium  . Hyperlipidemia 07/26/2007    Priority: Medium  . ANEMIA, B12 DEFICIENCY 12/29/2006    Priority: Medium  . RESTLESS LEG SYNDROME, MILD 12/28/2006    Priority: Medium  . OSTEOPOROSIS 12/12/2006    Priority: Medium  . Acute diverticulitis 10/05/2015    Priority: Low  . CKD (chronic kidney disease), stage II 01/22/2014    Priority: Low  . Posterior tibial tendon dysfunction 01/03/2014    Priority: Low  . Osteoarthritis, knee 01/03/2014    Priority: Low  . Glaucoma 01/03/2014    Priority: Low  . Obesity (BMI 30-39.9) 06/15/2013    Priority: Low  . Foot tendinitis 07/20/2010    Priority: Low  . INSOMNIA, CHRONIC 07/25/2009    Priority: Low  . GERD 12/25/2007    Priority: Low  . NEUROPATHY, IDIOPATHIC PERIPHERAL NEC 12/28/2006    Priority: Low  . BREAST CANCER, HX OF 12/12/2006    Priority: Low  . History of adenomatous polyp of colon 05/18/2017  . Diarrhea   . Polyp of ascending colon   . Arthritis of midfoot 03/03/2016  . Hammertoes of both feet  03/03/2016  . OA (osteoarthritis) of knee 12/30/2014  . Primary open-angle glaucoma 08/02/2012    Medications- reviewed and updated Current Outpatient Medications  Medication Sig Dispense Refill  . Bacillus Coagulans-Inulin (ALIGN PREBIOTIC-PROBIOTIC PO) Take 1 tablet by mouth daily.    [name] ALPRAZolam (XANAX) 0.25 MG tablet Take 1 tablet (0.25 mg total) by mouth daily as needed for anxiety. 30 tablet 0  . atenolol (TENORMIN) 25 MG tablet TAKE ONE TABLET BY MOUTH ONCE DAILY 90 tablet 3  . budesonide-formoterol (SYMBICORT) 160-4.5 MCG/ACT inhaler Inhale 2 puffs into the lungs 2 (two) times daily.    . cetirizine (ZYRTEC) 10 MG tablet Take 10 mg by mouth daily.    . Cholecalciferol (VITAMIN D3) 2000 units capsule Take 2,000 Units by mouth daily.    . Coenzyme Q10 300 MG CAPS Take 300 mg by mouth daily.    . colesevelam (WELCHOL) 625 MG tablet TAKE 1 TABLET BY MOUTH THREE TIMES DAILY 90 tablet 2  . dicyclomine (BENTYL) 10 MG capsule TAKE 2 CAPSULES BY MOUTH 4 TIMES DAILY AS NEEDED FOR  SPASMS 120 capsule 3  . dorzolamide-timolol (COSOPT) 22.3-6.8 MG/ML ophthalmic solution Place 1 drop into both eyes 2 (two) times daily.     [name] ESTRING 2 MG vaginal ring 1 ring pv every 3 months 1 each 4  . Flaxseed, Linseed, (FLAX SEEDS PO) Take 1,400 mg by mouth daily.    [name]  fluconazole (DIFLUCAN) 150 MG tablet Take one tab and repeat in 72 hours if needed. 2 tablet 0  . FLUoxetine (PROZAC) 40 MG capsule Take 1 capsule (40 mg total) by mouth daily. 90 capsule 3  . glimepiride (AMARYL) 2 MG tablet Take 1 tablet (2 mg total) by mouth daily with breakfast. 90 tablet 3  . glucose blood (FREESTYLE LITE) test strip USE TO CHECK BLOOD SUGAR DAILY AND AS NEEDED 50 each 11  . lisinopril (PRINIVIL,ZESTRIL) 10 MG tablet Take 1 tablet (10 mg total) by mouth daily. (Patient taking differently: Take 10 mg by mouth every evening. ) 90 tablet 3  . LUMIGAN 0.01 % SOLN Place 1 drop into both eyes at bedtime.     . Methylcellulose,  Laxative, (CITRUCEL PO) Take 3 tablets by mouth at bedtime.    . Misc Natural Products (GRAPE SEED COMPLEX) CAPS Take 1 capsule by mouth daily.    . Multiple Vitamin (MULTIVITAMIN WITH MINERALS) TABS tablet Take 1 tablet by mouth daily.    . Omega-3 Fatty Acids (FISH OIL) 1200 MG CAPS Take 1,200 mg by mouth daily.    . Polyvinyl Alcohol-Povidone (REFRESH OP) Apply 1 drop to eye daily as needed (dry eyes).    . pramipexole (MIRAPEX) 0.5 MG tablet TAKE 1 TABLET BY MOUTH AT BEDTIME 90 tablet 1  . Respiratory Therapy Supplies (FLUTTER) DEVI Use as directed 1 each 0  . Selenium 200 MCG CAPS Take 200 mcg by mouth daily.    . temazepam (RESTORIL) 15 MG capsule Take 2 capsules (30 mg total) by mouth at bedtime as needed for sleep. 60 capsule 5  . VICTOZA 18 MG/3ML SOPN INJECT 1.8MG  INTO THE SKIN DAILY 9 pen 6  . vitamin B-12 (CYANOCOBALAMIN) 1000 MCG tablet Take 1,000 mcg by mouth daily.     No current facility-administered medications for this visit.     Objective: BP 132/82 (BP Location: Left Arm, Patient Position: Sitting, Cuff Size: Normal)   Pulse 87   Temp 98.5 F (36.9 C) (Oral)   Ht 5\' 11"  (1.803 m)   Wt 245 lb (111.1 kg) Comment: Patient Reported  LMP 05/10/1992   SpO2 95%   BMI 34.17 kg/m  Gen: NAD, resting comfortably CV: RRR no murmurs rubs or gallops Lungs: CTAB no crackles, wheeze, rhonchi Abdomen: soft/nontender/nondistended/normal bowel sounds.  Ext: no edema Skin: warm, dry Down mood  Assessment/Plan:  Hypertension S: controlled on  atenolol 25mg  and lisinopril 10mg  BP Readings from Last 3 Encounters:  12/29/17 132/82  10/25/17 122/70  10/21/17 124/60  A/P: We discussed blood pressure goal of <140/90. Continue current meds  Diabetes mellitus type II, controlled ([place]) S: very well controlled on  amaryl 4 mg and victoza -doesn't tolerate metformin due to GI issues Exercise and diet- not exercising Lab Results  Component Value Date   HGBA1C 5.6 12/29/2017    HGBA1C 6.3 10/06/2017   HGBA1C 6.1 02/01/2017   A/P: a1c down to 5.6 today- will reduce amaryl to 2mg   As long as a1c under 6.5  Depression, major, single episode, in partial remission ([place]) S:  patient with phq9 improved from 10 last visit to now 6. Still with stress trying go find adoptive home for grandson. She is on prozac 40mg . Advised [facility]  Wants to downsize but cant afford to move.   Uses temazepam for sleep at 30mg . Sparing alprazolam with last rx 07/15/17.  A/P: Mild poor control. She asks about adding abilify but when we went through possible  side effects such as weight gain she became concerned - we did extended counseling and patient realizes that exercise and getting more involved with church usually help her so she will try to engage more - also we had a long talk about counseling and she seems more open- has handout for [place]  - if she fails to make progress- we can add abilify next visit or she can call in  -nccsrs reviewed and only temazepam and alprazolam through [name]. Refills for up to 6 months reasonable   Future Appointments  Date Time Provider [place]  01/19/2018  3:00 PM [facility] COACH [facility]  01/26/2018  2:45 PM Megan Salon, MD [place] None  02/08/2018  3:30 PM Juanito Doom, MD [facility] None  04/25/2018  2:45 PM Yong Channel Brayton Mars, MD [facility]   4 month follow up  Lab/Order associations: Controlled type 2 diabetes mellitus without complication, without long-term current use of insulin (HCC) - Plan: POCT glycosylated hemoglobin (Hb A1C), CANCELED: POCT glycosylated hemoglobin (Hb A1C)  Essential hypertension, benign  Depression, major, single episode, in partial remission ([place])  Meds ordered this encounter  Medications  . temazepam (RESTORIL) 15 MG capsule    Sig: Take 2 capsules (30 mg total) by mouth at bedtime as needed for sleep.    Dispense:  60 capsule    Refill:  5    Please  make sure patient is aware of dosage change and that she now needs to take 2 caps  . glimepiride (AMARYL) 2 MG tablet    Sig: Take 1 tablet (2 mg total) by mouth daily with breakfast.    Dispense:  90 tablet    Refill:  3    Return precautions advised.  [name], MD

## 2017-12-29 NOTE — Assessment & Plan Note (Signed)
S:  patient with phq9 improved from 10 last visit to now 6. Still with stress trying go find adoptive home for grandson. She is on prozac 40mg . Advised [facility]  Wants to downsize but cant afford to move.   Uses temazepam for sleep at 30mg . Sparing alprazolam with last rx 07/15/17.  A/P: Mild poor control. She asks about adding abilify but when we went through possible side effects such as weight gain she became concerned - we did extended counseling and patient realizes that exercise and getting more involved with church usually help her so she will try to engage more - also we had a long talk about counseling and she seems more open- has handout for [facility]  - if she fails to make progress- we can add abilify next visit or she can call in  -nccsrs reviewed and only temazepam and alprazolam through [name]. Refills for up to 6 months reasonable

## 2018-01-16 ENCOUNTER — Other Ambulatory Visit: Payer: Self-pay | Admitting: Family Medicine

## 2018-01-18 NOTE — Progress Notes (Addendum)
Subjective:   [name] is a 77 y.o. female who presents for Medicare Annual (Subsequent) preventive examination.  Reports health as fair What would make it great; states less stress  Is willing to pursue counseling  Last OV 8/22  Hx of depression - recommended counseling  Agrees to go to counseling  States church was her support but had medical issues and couldn't go  Also lost her soprano voice   Diet DM well controlled A1c 5.6 Lowered dosage of pill from 10mg  to 5mg   In am <150 and now it is now closer to 150  Still taking the regular dose and has not reduced  States she eats peanuts  Does eat vegetables  Doesn't eat a lot of meats  Exercise Is not exercising  Did go [facility] the [facility] but now has 2 TKR but is better Hyperbaric treatment for toe on her foot which got infected   Lower extremity neuropathy   Stress; Lost her voice when she was taking care of bronchitis and lost her suprano voice  Also wants to sell big home  Taking care of grandson  Discussed counseling and agrees to give it a try     Health Maintenance Due  Topic Date Due  . FOOT EXAM  07/17/2015  . INFLUENZA VACCINE  12/08/2017  . TETANUS/TDAP  12/24/2017   Foot exam (simple) completed Goes to a podiatrist who cuts her toenails and monitors her feet. Toes flexed; hammertoe sore area on left foot resolved  taking high does flu vaccine today   Bilateral mastectomies  GYN following; Dr. Hale Bogus  Colonoscopy  05/17/2017 Dr. Remi Haggard   Cardiac Risk Factors include: advanced age (>4men, >39 women);family history of premature cardiovascular disease;diabetes mellitus;hypertension;obesity (BMI >30kg/m2)       Objective:     Vitals: BP (!) 102/50   Pulse 91   Ht 6' (1.829 m)   Wt 248 lb (112.5 kg)   LMP 05/10/1992   SpO2 96%   BMI 33.63 kg/m   Body mass index is 33.63 kg/m.  Advanced Directives 01/19/2018 05/17/2017 12/05/2016 11/29/2016 11/29/2016 11/24/2016 03/03/2016  Does  Patient Have a Medical Advance Directive? Yes Yes Yes Yes Yes Yes No  Type of Advance Directive - [place];Living will [place];Living will - [place];Living will [place];Living will -  Does patient want to make changes to medical advance directive? - - - No - Patient declined - No - Patient declined -  Copy of [place] in Chart? - Yes No - copy requested No - copy requested No - copy requested - -  Would patient like information on creating a medical advance directive? - - - - - - No - patient declined information  Pre-existing out of facility DNR order (yellow form or pink MOST form) - - - - - - -    Tobacco Social History   Tobacco Use  Smoking Status Never Smoker  Smokeless Tobacco Never Used     Counseling given: Not Answered   Clinical Intake:     Past Medical History:  Diagnosis Date  . Allergy   . Anemia   . Anxiety   . Arthritis    right knee;injections every 33months   . Asthma   . Bronchiectasis ([place])   . Cancer (HCC)    HX BREAST CANCER  . COMPRESSION FRACTURE, LUMBAR VERTEBRAE 08/21/2008   Qualifier: Diagnosis of  By: Arnoldo Morale MD, Jenny Reichmann  E   . Depression   . Diabetes mellitus    takes Amaryl and Januvia daily  . Difficulty sleeping   . Diverticulitis   . Early cataracts, bilateral   . Eczema   . Endometriosis   . Gastritis   . Glaucoma   . Hammer toe   . Headache(784.0)    Migraines  . Hypertension    takes Atenolol daily  . IBS (irritable bowel syndrome)   . Joint pain   . Kidney stone   . Neuropathy   . Open wound of second toe of left foot    at pre-op appt, wound appears clear of infection 1 month post removal of toenail, no obvious exudate present nor any redness,  . Osteomyelitis ([place])    left 2nd toe  . Osteopenia   . PONV (postoperative nausea and vomiting)   . Posterior tibial tendon dysfunction    left foot  . Scoliosis   . Severe  esophageal dysplasia   . 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 Thyroid disease   . Vitamin D deficiency    takes Vit d daily   Past Surgical History:  Procedure Laterality Date  . ADENOIDECTOMY     at age 28  . biopsy on back     benign  . CATARACT EXTRACTION    . CHOLECYSTECTOMY  06/2008  . COLONOSCOPY  11/02/05  . COLONOSCOPY WITH PROPOFOL N/A 05/17/2017   Procedure: COLONOSCOPY WITH PROPOFOL;  Surgeon: Mauri Pole, MD;  Location: [facility];  Service: Endoscopy;  Laterality: N/A;  PT WILL BE ADMITTED THE DAY BEFORE FOR PREP PER ROBIN KB  . DILATION AND CURETTAGE OF UTERUS    . ENDOMETRIAL BIOPSY  06/22/2011   benign polyp, no hyperplasia  . excision on breast     internal infected suture from breast surgery  . excision removed from neck     infected lymph node  . FOOT SURGERY     left foot-cleaning out areas on toes at the wound center-having MRI to determine if osteomyelitis  . HYSTEROSCOPY  06/22/11   PMB submucosal myoma  . JOINT REPLACEMENT     left total shoulder  . LAPAROSCOPIC OOPHORECTOMY Right 12/2004   absent LSO  . LAPAROTOMY    . MASTECTOMY Bilateral 1994 right, 1995 left  . SHOULDER HEMI-ARTHROPLASTY  01/01/2012   Procedure: SHOULDER HEMI-ARTHROPLASTY;  Surgeon: Roseanne Kaufman, MD;  Location: [place];  Service: Orthopedics;  Laterality: Left;  Left Shoulder Hemi Arthroplasty with Repair and Reconstruction as Necessary   . THYROIDECTOMY     50yrs ago. follows endocrine  . TONSILLECTOMY    . TOTAL KNEE ARTHROPLASTY Right 12/30/2014   Procedure: RIGHT TOTAL KNEE ARTHROPLASTY;  Surgeon: Gaynelle Arabian, MD;  Location: [facility];  Service: Orthopedics;  Laterality: Right;  . TOTAL KNEE ARTHROPLASTY Left 11/29/2016   Procedure: LEFT TOTAL KNEE ARTHROPLASTY;  Surgeon: Gaynelle Arabian, MD;  Location: [facility];  Service: Orthopedics;  Laterality: Left;   Family History  Problem Relation Age of Onset  . Arthritis Mother   . COPD Father    . Heart disease Father        MI 85  . Hypertension Father   . Hyperlipidemia Father   . Pancreatic cancer Paternal Grandfather   . Colon cancer Neg Hx   . Esophageal cancer Neg Hx   . Rectal cancer Neg Hx   . Stomach cancer Neg Hx    Social History  Socioeconomic History  . Marital status: Married    Spouse name: Not on file  . Number of children: Not on file  . Years of education: Not on file  . Highest education level: Not on file  Occupational History  . Not on file  Social Needs  . Financial resource strain: Not on file  . Food insecurity:    Worry: Not on file    Inability: Not on file  . Transportation needs:    Medical: Not on file    Non-medical: Not on file  Tobacco Use  . Smoking status: Never Smoker  . Smokeless tobacco: Never Used  Substance and Sexual Activity  . Alcohol use: No  . Drug use: No  . Sexual activity: Not on file  Lifestyle  . Physical activity:    Days per week: Not on file    Minutes per session: Not on file  . Stress: Not on file  Relationships  . Social connections:    Talks on phone: Not on file    Gets together: Not on file    Attends religious service: Not on file    Active member of club or organization: Not on file    Attends meetings of clubs or organizations: Not on file    Relationship status: Not on file  Other Topics Concern  . Not on file  Social History Narrative   Married 39 years in 2015. 1 adopted son. 1 grandkid (43 yo grandson)      Retired from Development worker, international aid at [employer]: fashion, tv shopping, sewing, decorate    Outpatient Encounter Medications as of 01/19/2018  Medication Sig  . atenolol (TENORMIN) 25 MG tablet TAKE ONE TABLET BY MOUTH ONCE DAILY  . Bacillus Coagulans-Inulin (ALIGN PREBIOTIC-PROBIOTIC PO) Take 1 tablet by mouth daily.  . budesonide-formoterol (SYMBICORT) 160-4.5 MCG/ACT inhaler Inhale 2 puffs into the lungs 2 (two) times daily.  . cetirizine (ZYRTEC) 10 MG tablet Take 10 mg by mouth  daily.  . Cholecalciferol (VITAMIN D3) 2000 units capsule Take 2,000 Units by mouth daily.  . Coenzyme Q10 300 MG CAPS Take 300 mg by mouth daily.  . colesevelam (WELCHOL) 625 MG tablet TAKE 1 TABLET BY MOUTH THREE TIMES DAILY  . dicyclomine (BENTYL) 10 MG capsule TAKE 2 CAPSULES BY MOUTH 4 TIMES DAILY AS NEEDED FOR  SPASMS  . dorzolamide-timolol (COSOPT) 22.3-6.8 MG/ML ophthalmic solution Place 1 drop into both eyes 2 (two) times daily.   [name] ESTRING 2 MG vaginal ring 1 ring pv every 3 months  . Flaxseed, Linseed, (FLAX SEEDS PO) Take 1,400 mg by mouth daily.  . fluconazole (DIFLUCAN) 150 MG tablet Take one tab and repeat in 72 hours if needed.  [name] FLUoxetine (PROZAC) 40 MG capsule Take 1 capsule (40 mg total) by mouth daily.  [name] glimepiride (AMARYL) 2 MG tablet Take 1 tablet (2 mg total) by mouth daily with breakfast.  . glucose blood (FREESTYLE LITE) test strip USE TO CHECK BLOOD SUGAR DAILY AND AS NEEDED  . lisinopril (PRINIVIL,ZESTRIL) 10 MG tablet Take 1 tablet (10 mg total) by mouth daily. (Patient taking differently: Take 10 mg by mouth every evening. )  . LUMIGAN 0.01 % SOLN Place 1 drop into both eyes at bedtime.   . Methylcellulose, Laxative, (CITRUCEL PO) Take 3 tablets by mouth at bedtime.  . Misc Natural Products (GRAPE SEED COMPLEX) CAPS Take 1 capsule by mouth daily.  . Multiple Vitamin (MULTIVITAMIN WITH MINERALS) TABS tablet Take 1  tablet by mouth daily.  . Omega-3 Fatty Acids (FISH OIL) 1200 MG CAPS Take 1,200 mg by mouth daily.  . Polyvinyl Alcohol-Povidone (REFRESH OP) Apply 1 drop to eye daily as needed (dry eyes).  . pramipexole (MIRAPEX) 0.5 MG tablet TAKE 1 TABLET BY MOUTH AT BEDTIME  . Respiratory Therapy Supplies (FLUTTER) DEVI Use as directed  . Selenium 200 MCG CAPS Take 200 mcg by mouth daily.  . temazepam (RESTORIL) 15 MG capsule Take 2 capsules (30 mg total) by mouth at bedtime as needed for sleep.  [name] VICTOZA 18 MG/3ML SOPN INJECT 1.8 MG INTO THE SKIN DAILY  .  vitamin B-12 (CYANOCOBALAMIN) 1000 MCG tablet Take 1,000 mcg by mouth daily.  [name] ALPRAZolam (XANAX) 0.25 MG tablet Take 1 tablet (0.25 mg total) by mouth daily as needed for anxiety. (Patient not taking: Reported on 01/19/2018)   No facility-administered encounter medications on file as of 01/19/2018.     Activities of Daily Living In your present state of health, do you have any difficulty performing the following activities: 01/19/2018  Hearing? N  Vision? N  Difficulty concentrating or making decisions? Y  Comment mild issues due to depression  Walking or climbing stairs? Y  Dressing or bathing? N  Doing errands, shopping? N  Preparing Food and eating ? N  Using the Toilet? N  In the past six months, have you accidently leaked urine? (No Data)  Comment wears an attends  Do you have problems with loss of bowel control? N  Managing your Medications? N  Managing your Finances? N  Housekeeping or managing your Housekeeping? N  Some recent data might be hidden    Patient Care Team: Marin Olp, MD as PCP - General (Family Medicine)    Assessment:   This is a routine wellness examination for [facility].  Exercise Activities and Dietary recommendations Current Exercise Habits: Home exercise routine, Intensity: Mild  Goals    . Patient Stated     Will try to feel better and concentrate on self        Fall Risk Fall Risk  01/19/2018 10/06/2017 03/03/2016 05/26/2015 01/30/2014  Falls in the past year? Yes No No No No  Number falls in past yr: 1 - - - -  Injury with Fall? Yes - - - -  Comment fell on front steps after TKR  - - - -  Risk for fall due to : - - Other (Comment) - Other (Comment)  Follow up Education provided - - - -  Comment just finished up with Dr. Maureen Ralphs - - - -     Depression Screen PHQ 2/9 Scores 01/19/2018 12/29/2017 10/06/2017 10/06/2017  PHQ - 2 Score 4 3 4 2   PHQ- 9 Score 9 6 10  -  Exception Documentation - - - -     Cognitive Function MMSE - Mini  Mental State Exam 01/19/2018  Not completed: (No Data)   Ad8 score reviewed for issues:  Issues making decisions:  Less interest in hobbies / activities:  Repeats questions, stories (family complaining):  Trouble using ordinary gadgets (microwave, computer, phone):  Forgets the month or year:   Mismanaging finances:   Remembering appts:  Daily problems with thinking and/or memory: Ad8 score is=0  Likes to do cross word puzzles Manages he med and finances        Immunization History  Administered Date(s) Administered  . H1N1 06/05/2008  . Influenza Split 02/07/2012  . Influenza Whole 03/10/2000, 01/24/2008, 03/10/2009, 01/20/2010  .  Influenza, High Dose Seasonal PF 02/12/2013, 01/31/2017, 01/19/2018  . Influenza,inj,Quad PF,6+ Mos 01/17/2014, 03/27/2015  . Influenza-Unspecified 02/09/2016  . Pneumococcal Conjugate-13 01/17/2014  . Pneumococcal Polysaccharide-23 03/10/2000, 05/02/2007, 01/02/2012  . Td 05/10/1992, 12/25/2007      Screening Tests Health Maintenance  Topic Date Due  . FOOT EXAM  07/17/2015  . INFLUENZA VACCINE  12/08/2017  . TETANUS/TDAP  12/24/2017  . DEXA SCAN  07/16/2024 (Originally 01/11/2005)  . HEMOGLOBIN A1C  07/01/2018  . OPHTHALMOLOGY EXAM  09/03/2018  . PNA vac Low Risk Adult  Completed         Plan:      PCP Notes   Health Maintenance Foot exam (simple) completed Goes to a podiatrist who cuts her toenails and monitors her feet. Toes flexed; hammertoe sore area on left foot resolved  taking high does flu vaccine today   Bilateral mastectomies  GYN following; Dr. Hale Bogus  Colonoscopy  05/17/2017 Dr. Remi Haggard    Abnormal Screens  Neuropathy in both feet she feels secondary to cancer treatment  Depression score still 9; agrees to go into counseling  Discussed the gold standard of treatment includes counseling Has has losses with her saprano voice and no longer can sing at church; Also would like to sell the large  home.   Referrals  none  Patient concerns; Had shingles in her eye Educated regarding the shingrix; is past the doughnut hole in her benefit and encouraged to take the injections now, before the end of the year   Amaryl reduced to 2 mg at [facility] 8/22  States In the am  BS<150 and now it is now closer to 150  Still taking the regular dose and has not reduced  Not sure why her BS have been more elevated in the am    Nurse Concerns; As noted   Next PCP apt 04/25/2018       I have personally reviewed and noted the following in the patient's chart:   . Medical and social history . Use of alcohol, tobacco or illicit drugs  . Current medications and supplements . Functional ability and status . Nutritional status . Physical activity . Advanced directives . List of other physicians . Hospitalizations, surgeries, and ER visits in previous 12 months . Vitals . Screenings to include cognitive, depression, and falls . Referrals and appointments  In addition, I have reviewed and discussed with patient certain preventive protocols, quality metrics, and best practice recommendations. A written personalized care plan for preventive services as well as general preventive health recommendations were provided to patient.     Wynetta Fines, RN  01/19/2018  10/10 [name] stated she is now taking Glimepiride 4 mg as her BS were higher on 2 and now down to 140 to 150. Her apt is still scheduled 12/17  Wynetta Fines RN

## 2018-01-19 ENCOUNTER — Ambulatory Visit (INDEPENDENT_AMBULATORY_CARE_PROVIDER_SITE_OTHER): Payer: Medicare Other

## 2018-01-19 DIAGNOSIS — Z23 Encounter for immunization: Secondary | ICD-10-CM | POA: Diagnosis not present

## 2018-01-19 DIAGNOSIS — Z0000 Encounter for general adult medical examination without abnormal findings: Secondary | ICD-10-CM

## 2018-01-19 NOTE — Progress Notes (Signed)
I have reviewed and agree with note, evaluation, plan.   Manuela Schwartz- I am thrilled she agreed to go to counseling. Thanks for helping her toward this decision. I think this can really benefit her  Lab Results  Component Value Date   HGBA1C 5.6 12/29/2017  With last a1c being so good- I just plan to wait until next visit to see how a1c is looking before making any changes in regards to high AM BS. If she were to have #s raise further- would want her to come back in.   [name], MD

## 2018-01-19 NOTE — Patient Instructions (Addendum)
Ms. Nelligan , Thank you for taking time to come for your Medicare Wellness Visit. I appreciate your ongoing commitment to your health goals. Please review the following plan we discussed and let me know if I can assist you in the future.   Will pursue seeing [facility] to see if it will be helpful   [facility]: [phone].  Mental Health Association: [phone])[phone]. In [place] provides community education and support activities for individuals recovering from mental illness: http://www.kerr.com/ or call (501)273-4207, or      in [place] at CheapWarrants[employer]it or call [phone].  NAMI ([place]): of [place], self-help and advocacy organization: 628 258 7832. In [place], [place], http://www.kerr.com/  In [place] contact [facility] affiliates [employer]apWarrants[employer]it or call 351-245-6883. In [facility], www.[facility]admentalhealth.org or call [phone].  [facility]: General mental health treatment services on a sliding fee scale, Medicaid and Medicare www.uncg/edu/psy/clinic.htm or call 941-306-5360.  A Tetanus is recommended every 10 years. Medicare covers a tetanus if you have a cut or wound; otherwise, there may be a charge. If you had not had a tetanus with pertusses, known as the Tdap, you can take this anytime.   These are the goals we discussed:  Shingrix is a vaccine for the prevention of Shingles in Adults 50 and older.  If you are on Medicare, the shingrix is covered under your Part D plan, so you will take both of the vaccines in the series at your pharmacy. Please check with your benefits regarding applicable copays or out of pocket expenses.  The Shingrix is given in 2 vaccines approx 8 weeks apart. You must receive the 2nd dose prior to 6 months from receipt of the first. Please have the pharmacist print out you Immunization  dates for our office records     Goals   None     This is a list of the screening recommended for you and  due dates:  Health Maintenance  Topic Date Due  . Complete foot exam   07/17/2015  . Flu Shot  12/08/2017  . Tetanus Vaccine  12/24/2017  . DEXA scan (bone density measurement)  07/16/2024*  . Hemoglobin A1C  07/01/2018  . Eye exam for diabetics  09/03/2018  . Pneumonia vaccines  Completed  *Topic was postponed. The date shown is not the original due date.      Fall Prevention in the Home Falls can cause injuries. They can happen to people of all ages. There are many things you can do to make your home safe and to help prevent falls. What can I do on the outside of my home?  Regularly fix the edges of walkways and driveways and fix any cracks.  Remove anything that might make you trip as you walk through a door, such as a raised step or threshold.  Trim any bushes or trees on the path to your home.  Use bright outdoor lighting.  Clear any walking paths of anything that might make someone trip, such as rocks or tools.  Regularly check to see if handrails are loose or broken. Make sure that both sides of any steps have handrails.  Any raised decks and porches should have guardrails on the edges.  Have any leaves, snow, or ice cleared regularly.  Use sand or salt on walking paths during winter.  Clean up any spills in your garage right away. This includes oil or grease spills. What can I do in the bathroom?  Use night  lights.  Install grab bars by the toilet and in the tub and shower. Do not use towel bars as grab bars.  Use non-skid mats or decals in the tub or shower.  If you need to sit down in the shower, use a plastic, non-slip stool.  Keep the floor dry. Clean up any water that spills on the floor as soon as it happens.  Remove soap buildup in the tub or shower regularly.  Attach bath mats securely with double-sided non-slip rug tape.  Do not have throw rugs and other things on the floor that can make you trip. What can I do in the bedroom?  Use night  lights.  Make sure that you have a light by your bed that is easy to reach.  Do not use any sheets or blankets that are too big for your bed. They should not hang down onto the floor.  Have a firm chair that has side arms. You can use this for support while you get dressed.  Do not have throw rugs and other things on the floor that can make you trip. What can I do in the kitchen?  Clean up any spills right away.  Avoid walking on wet floors.  Keep items that you use a lot in easy-to-reach places.  If you need to reach something above you, use a strong step stool that has a grab bar.  Keep electrical cords out of the way.  Do not use floor polish or wax that makes floors slippery. If you must use wax, use non-skid floor wax.  Do not have throw rugs and other things on the floor that can make you trip. What can I do with my stairs?  Do not leave any items on the stairs.  Make sure that there are handrails on both sides of the stairs and use them. Fix handrails that are broken or loose. Make sure that handrails are as long as the stairways.  Check any carpeting to make sure that it is firmly attached to the stairs. Fix any carpet that is loose or worn.  Avoid having throw rugs at the top or bottom of the stairs. If you do have throw rugs, attach them to the floor with carpet tape.  Make sure that you have a light switch at the top of the stairs and the bottom of the stairs. If you do not have them, ask someone to add them for you. What else can I do to help prevent falls?  Wear shoes that: ? Do not have high heels. ? Have rubber bottoms. ? Are comfortable and fit you well. ? Are closed at the toe. Do not wear sandals.  If you use a stepladder: ? Make sure that it is fully opened. Do not climb a closed stepladder. ? Make sure that both sides of the stepladder are locked into place. ? Ask someone to hold it for you, if possible.  Clearly mark and make sure that you can  see: ? Any grab bars or handrails. ? First and last steps. ? Where the edge of each step is.  Use tools that help you move around (mobility aids) if they are needed. These include: ? Canes. ? Walkers. ? Scooters. ? Crutches.  Turn on the lights when you go into a dark area. Replace any light bulbs as soon as they burn out.  Set up your furniture so you have a clear path. Avoid moving your furniture around.  If any of your  floors are uneven, fix them.  If there are any pets around you, be aware of where they are.  Review your medicines with your doctor. Some medicines can make you feel dizzy. This can increase your chance of falling. Ask your doctor what other things that you can do to help prevent falls. This information is not intended to replace advice given to you by your health care provider. Make sure you discuss any questions you have with your health care provider. Document Released: 02/20/2009 Document Revised: 10/02/2015 Document Reviewed: 05/31/2014 Elsevier Interactive Patient Education  2018 [place] Maintenance, Female Adopting a healthy lifestyle and getting preventive care can go a long way to promote health and wellness. Talk with your health care provider about what schedule of regular examinations is right for you. This is a good chance for you to check in with your provider about disease prevention and staying healthy. In between checkups, there are plenty of things you can do on your own. Experts have done a lot of research about which lifestyle changes and preventive measures are most likely to keep you healthy. Ask your health care provider for more information. Weight and diet Eat a healthy diet  Be sure to include plenty of vegetables, fruits, low-fat dairy products, and lean protein.  Do not eat a lot of foods high in solid fats, added sugars, or salt.  Get regular exercise. This is one of the most important things you can do for your  health. ? Most adults should exercise for at least 150 minutes each week. The exercise should increase your heart rate and make you sweat (moderate-intensity exercise). ? Most adults should also do strengthening exercises at least twice a week. This is in addition to the moderate-intensity exercise.  Maintain a healthy weight  Body mass index (BMI) is a measurement that can be used to identify possible weight problems. It estimates body fat based on height and weight. Your health care provider can help determine your BMI and help you achieve or maintain a healthy weight.  For females 78 years of age and older: ? A BMI below 18.5 is considered underweight. ? A BMI of 18.5 to 24.9 is normal. ? A BMI of 25 to 29.9 is considered overweight. ? A BMI of 30 and above is considered obese.  Watch levels of cholesterol and blood lipids  You should start having your blood tested for lipids and cholesterol at 78 years of age, then have this test every 5 years.  You may need to have your cholesterol levels checked more often if: ? Your lipid or cholesterol levels are high. ? You are older than 78 years of age. ? You are at high risk for heart disease.  Cancer screening Lung Cancer  Lung cancer screening is recommended for adults 70-67 years old who are at high risk for lung cancer because of a history of smoking.  A yearly low-dose CT scan of the lungs is recommended for people who: ? Currently smoke. ? Have quit within the past 15 years. ? Have at least a 30-pack-year history of smoking. A pack year is smoking an average of one pack of cigarettes a day for 1 year.  Yearly screening should continue until it has been 15 years since you quit.  Yearly screening should stop if you develop a health problem that would prevent you from having lung cancer treatment.  Breast Cancer  Practice breast self-awareness. This means understanding how your breasts normally appear and  feel.  It also means  doing regular breast self-exams. Let your health care provider know about any changes, no matter how small.  If you are in your 20s or 30s, you should have a clinical breast exam (CBE) by a health care provider every 1-3 years as part of a regular health exam.  If you are 58 or older, have a CBE every year. Also consider having a breast X-ray (mammogram) every year.  If you have a family history of breast cancer, talk to your health care provider about genetic screening.  If you are at high risk for breast cancer, talk to your health care provider about having an MRI and a mammogram every year.  Breast cancer gene (BRCA) assessment is recommended for women who have family members with BRCA-related cancers. BRCA-related cancers include: ? Breast. ? Ovarian. ? Tubal. ? Peritoneal cancers.  Results of the assessment will determine the need for genetic counseling and BRCA1 and BRCA2 testing.  Cervical Cancer Your health care provider may recommend that you be screened regularly for cancer of the pelvic organs (ovaries, uterus, and vagina). This screening involves a pelvic examination, including checking for microscopic changes to the surface of your cervix (Pap test). You may be encouraged to have this screening done every 3 years, beginning at age 23.  For women ages 51-65, health care providers may recommend pelvic exams and Pap testing every 3 years, or they may recommend the Pap and pelvic exam, combined with testing for human papilloma virus (HPV), every 5 years. Some types of HPV increase your risk of cervical cancer. Testing for HPV may also be done on women of any age with unclear Pap test results.  Other health care providers may not recommend any screening for nonpregnant women who are considered low risk for pelvic cancer and who do not have symptoms. Ask your health care provider if a screening pelvic exam is right for you.  If you have had past treatment for cervical cancer or a  condition that could lead to cancer, you need Pap tests and screening for cancer for at least 20 years after your treatment. If Pap tests have been discontinued, your risk factors (such as having a new sexual partner) need to be reassessed to determine if screening should resume. Some women have medical problems that increase the chance of getting cervical cancer. In these cases, your health care provider may recommend more frequent screening and Pap tests.  Colorectal Cancer  This type of cancer can be detected and often prevented.  Routine colorectal cancer screening usually begins at 78 years of age and continues through 78 years of age.  Your health care provider may recommend screening at an earlier age if you have risk factors for colon cancer.  Your health care provider may also recommend using home test kits to check for hidden blood in the stool.  A small camera at the end of a tube can be used to examine your colon directly (sigmoidoscopy or colonoscopy). This is done to check for the earliest forms of colorectal cancer.  Routine screening usually begins at age 1.  Direct examination of the colon should be repeated every 5-10 years through 78 years of age. However, you may need to be screened more often if early forms of precancerous polyps or small growths are found.  Skin Cancer  Check your skin from head to toe regularly.  Tell your health care provider about any new moles or changes in moles, especially if there  is a change in a mole's shape or color.  Also tell your health care provider if you have a mole that is larger than the size of a pencil eraser.  Always use sunscreen. Apply sunscreen liberally and repeatedly throughout the day.  Protect yourself by wearing long sleeves, pants, a wide-brimmed hat, and sunglasses whenever you are outside.  Heart disease, diabetes, and high blood pressure  High blood pressure causes heart disease and increases the risk of stroke.  High blood pressure is more likely to develop in: ? People who have blood pressure in the high end of the normal range (130-139/85-89 mm Hg). ? People who are overweight or obese. ? People who are African American.  If you are 29-58 years of age, have your blood pressure checked every 3-5 years. If you are 52 years of age or older, have your blood pressure checked every year. You should have your blood pressure measured twice-once when you are at a hospital or clinic, and once when you are not at a hospital or clinic. Record the average of the two measurements. To check your blood pressure when you are not at a hospital or clinic, you can use: ? An automated blood pressure machine at a pharmacy. ? A home blood pressure monitor.  If you are between 8 years and 15 years old, ask your health care provider if you should take aspirin to prevent strokes.  Have regular diabetes screenings. This involves taking a blood sample to check your fasting blood sugar level. ? If you are at a normal weight and have a low risk for diabetes, have this test once every three years after 78 years of age. ? If you are overweight and have a high risk for diabetes, consider being tested at a younger age or more often. Preventing infection Hepatitis B  If you have a higher risk for hepatitis B, you should be screened for this virus. You are considered at high risk for hepatitis B if: ? You were born in a country where hepatitis B is common. Ask your health care provider which countries are considered high risk. ? Your parents were born in a high-risk country, and you have not been immunized against hepatitis B (hepatitis B vaccine). ? You have HIV or AIDS. ? You use needles to inject street drugs. ? You live with someone who has hepatitis B. ? You have had sex with someone who has hepatitis B. ? You get hemodialysis treatment. ? You take certain medicines for conditions, including cancer, organ transplantation, and  autoimmune conditions.  Hepatitis C  Blood testing is recommended for: ? Everyone born from [birth year] through 1966. ? Anyone with known risk factors for hepatitis C.  Sexually transmitted infections (STIs)  You should be screened for sexually transmitted infections (STIs) including gonorrhea and chlamydia if: ? You are sexually active and are younger than 78 years of age. ? You are older than 78 years of age and your health care provider tells you that you are at risk for this type of infection. ? Your sexual activity has changed since you were last screened and you are at an increased risk for chlamydia or gonorrhea. Ask your health care provider if you are at risk.  If you do not have HIV, but are at risk, it may be recommended that you take a prescription medicine daily to prevent HIV infection. This is called pre-exposure prophylaxis (PrEP). You are considered at risk if: ? You are sexually active and do  not regularly use condoms or know the HIV status of your partner(s). ? You take drugs by injection. ? You are sexually active with a partner who has HIV.  Talk with your health care provider about whether you are at high risk of being infected with HIV. If you choose to begin PrEP, you should first be tested for HIV. You should then be tested every 3 months for as long as you are taking PrEP. Pregnancy  If you are premenopausal and you may become pregnant, ask your health care provider about preconception counseling.  If you may become pregnant, take 400 to 800 micrograms (mcg) of folic acid every day.  If you want to prevent pregnancy, talk to your health care provider about birth control (contraception). Osteoporosis and menopause  Osteoporosis is a disease in which the bones lose minerals and strength with aging. This can result in serious bone fractures. Your risk for osteoporosis can be identified using a bone density scan.  If you are 9 years of age or older, or if you are at  risk for osteoporosis and fractures, ask your health care provider if you should be screened.  Ask your health care provider whether you should take a calcium or vitamin D supplement to lower your risk for osteoporosis.  Menopause may have certain physical symptoms and risks.  Hormone replacement therapy may reduce some of these symptoms and risks. Talk to your health care provider about whether hormone replacement therapy is right for you. Follow these instructions at home:  Schedule regular health, dental, and eye exams.  Stay current with your immunizations.  Do not use any tobacco products including cigarettes, chewing tobacco, or electronic cigarettes.  If you are pregnant, do not drink alcohol.  If you are breastfeeding, limit how much and how often you drink alcohol.  Limit alcohol intake to no more than 1 drink per day for nonpregnant women. One drink equals 12 ounces of beer, 5 ounces of wine, or 1 ounces of hard liquor.  Do not use street drugs.  Do not share needles.  Ask your health care provider for help if you need support or information about quitting drugs.  Tell your health care provider if you often feel depressed.  Tell your health care provider if you have ever been abused or do not feel safe at home. This information is not intended to replace advice given to you by your health care provider. Make sure you discuss any questions you have with your health care provider. Document Released: 11/09/2010 Document Revised: 10/02/2015 Document Reviewed: 01/28/2015 Elsevier Interactive Patient Education  [employer].

## 2018-01-23 ENCOUNTER — Other Ambulatory Visit: Payer: Self-pay | Admitting: Obstetrics & Gynecology

## 2018-01-23 NOTE — Telephone Encounter (Signed)
Medication refill request: Estring  Last Office visit 10/21/17 Next AEX: 01/26/18  Last [facility]G (if hormonal medication request):  None found Refill authorized: Spoke with patient she states that she does not have any Estrings left.

## 2018-01-25 ENCOUNTER — Other Ambulatory Visit: Payer: Self-pay | Admitting: Obstetrics & Gynecology

## 2018-01-25 MED ORDER — ESTRING 2 MG VA RING: VAGINAL_RING | VAGINAL | 3 refills | Status: DC

## 2018-01-25 NOTE — Telephone Encounter (Signed)
Patient went to pick up her Estring from the pharmacy on file and it had not been sent. Patient has an appointment tomorrow and asked if someone could call her when this has been sent .

## 2018-01-25 NOTE — Telephone Encounter (Signed)
Rx has been completed

## 2018-01-26 ENCOUNTER — Ambulatory Visit (INDEPENDENT_AMBULATORY_CARE_PROVIDER_SITE_OTHER): Payer: Medicare Other | Admitting: Obstetrics & Gynecology

## 2018-01-26 VITALS — BP 128/66 | HR 88 | Resp 16 | Ht 71.0 in | Wt 248.0 lb

## 2018-01-26 DIAGNOSIS — N952 Postmenopausal atrophic vaginitis: Secondary | ICD-10-CM | POA: Diagnosis not present

## 2018-01-26 MED ORDER — FLUCONAZOLE 150 MG PO TABS: 150.0000 mg | ORAL_TABLET | Freq: Once | ORAL | 1 refills | Status: AC

## 2018-01-26 NOTE — Progress Notes (Signed)
GYNECOLOGY  VISIT  CC:   estring replacement  HPI: 77 y.o. G0P0 Married White or Caucasian female here for estring replacement.  She cannot do this herself so comes for removal.  Denies any new issues.  Has grandson now--he is 45.  This is a lot of responsibility for her and her husband and creating a lot of stressors.    GYNECOLOGIC HISTORY: Patient's last menstrual period was 05/10/1992. Contraception: post menopausal  Menopausal hormone therapy: none  Patient Active Problem List   Diagnosis Date Noted  . History of adenomatous polyp of colon 05/18/2017  . Diarrhea   . Polyp of ascending colon   . Arthritis of midfoot 03/03/2016  . Hammertoes of both feet 03/03/2016  . Aortic atherosclerosis ([place]) 02/16/2016  . Solitary pulmonary nodule 01/08/2016  . 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  . Bronchiectasis without acute exacerbation ([place]) 10/18/2015  . Acute diverticulitis 10/05/2015  . OA (osteoarthritis) of knee 12/30/2014  . IBS (irritable bowel syndrome) 07/17/2014  . CKD (chronic kidney disease), stage II 01/22/2014  . Depression, major, single episode, in partial remission ([place]) 01/03/2014  . Posterior tibial tendon dysfunction 01/03/2014  . Osteoarthritis, knee 01/03/2014  . Glaucoma 01/03/2014  . Essential hypertension, benign 01/03/2014  . Obesity (BMI 30-39.9) 06/15/2013  . Primary open-angle glaucoma 08/02/2012  . Foot tendinitis 07/20/2010  . INSOMNIA, CHRONIC 07/25/2009  . Fatty liver 03/18/2009  . GERD 12/25/2007  . Hyperlipidemia 07/26/2007  . ANEMIA, B12 DEFICIENCY 12/29/2006  . Diabetes mellitus type II, controlled ([place]) 12/28/2006  . RESTLESS LEG SYNDROME, MILD 12/28/2006  . NEUROPATHY, IDIOPATHIC PERIPHERAL NEC 12/28/2006  . OSTEOPOROSIS 12/12/2006  . BREAST CANCER, HX OF 12/12/2006    Past Medical History:  Diagnosis Date  . Allergy   . Anemia   . Anxiety   . Arthritis    right knee;injections every 26months   . Asthma   . Bronchiectasis ([place])   . Cancer  (HCC)    HX BREAST CANCER  . COMPRESSION FRACTURE, LUMBAR VERTEBRAE 08/21/2008   Qualifier: Diagnosis of  By: Arnoldo Morale MD, Balinda Quails   . Depression   . Diabetes mellitus    takes Amaryl and Januvia daily  . Difficulty sleeping   . Diverticulitis   . Early cataracts, bilateral   . Eczema   . Endometriosis   . Gastritis   . Glaucoma   . Hammer toe   . Headache(784.0)    Migraines  . Hypertension    takes Atenolol daily  . IBS (irritable bowel syndrome)   . Joint pain   . Kidney stone   . Neuropathy   . Open wound of second toe of left foot    at pre-op appt, wound appears clear of infection 1 month post removal of toenail, no obvious exudate present nor any redness,  . Osteomyelitis ([place])    left 2nd toe  . Osteopenia   . PONV (postoperative nausea and vomiting)   . Posterior tibial tendon dysfunction    left foot  . Scoliosis   . Severe esophageal dysplasia   . 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 Thyroid disease   . Vitamin D deficiency    takes Vit d daily    Past Surgical History:  Procedure Laterality Date  . ADENOIDECTOMY     at age 69  . biopsy on back     benign  . CATARACT EXTRACTION    . CHOLECYSTECTOMY  06/2008  . COLONOSCOPY  11/02/05  .  COLONOSCOPY WITH PROPOFOL N/A 05/17/2017   Procedure: COLONOSCOPY WITH PROPOFOL;  Surgeon: Mauri Pole, MD;  Location: [facility];  Service: Endoscopy;  Laterality: N/A;  PT WILL BE ADMITTED THE DAY BEFORE FOR PREP PER ROBIN KB  . DILATION AND CURETTAGE OF UTERUS    . ENDOMETRIAL BIOPSY  06/22/2011   benign polyp, no hyperplasia  . excision on breast     internal infected suture from breast surgery  . excision removed from neck     infected lymph node  . FOOT SURGERY     left foot-cleaning out areas on toes at the wound center-having MRI to determine if osteomyelitis  . HYSTEROSCOPY  06/22/11   PMB submucosal myoma  . JOINT REPLACEMENT     left total shoulder  .  LAPAROSCOPIC OOPHORECTOMY Right 12/2004   absent LSO  . LAPAROTOMY    . MASTECTOMY Bilateral 1994 right, 1995 left  . SHOULDER HEMI-ARTHROPLASTY  01/01/2012   Procedure: SHOULDER HEMI-ARTHROPLASTY;  Surgeon: Roseanne Kaufman, MD;  Location: [facility];  Service: Orthopedics;  Laterality: Left;  Left Shoulder Hemi Arthroplasty with Repair and Reconstruction as Necessary   . THYROIDECTOMY     106yrs ago. follows endocrine  . TONSILLECTOMY    . TOTAL KNEE ARTHROPLASTY Right 12/30/2014   Procedure: RIGHT TOTAL KNEE ARTHROPLASTY;  Surgeon: Gaynelle Arabian, MD;  Location: [facility];  Service: Orthopedics;  Laterality: Right;  . TOTAL KNEE ARTHROPLASTY Left 11/29/2016   Procedure: LEFT TOTAL KNEE ARTHROPLASTY;  Surgeon: Gaynelle Arabian, MD;  Location: [facility];  Service: Orthopedics;  Laterality: Left;    MEDS:   Current Outpatient Medications on File Prior to Visit  Medication Sig Dispense Refill  . ALPRAZolam (XANAX) 0.25 MG tablet Take 1 tablet (0.25 mg total) by mouth daily as needed for anxiety. 30 tablet 0  . atenolol (TENORMIN) 25 MG tablet TAKE ONE TABLET BY MOUTH ONCE DAILY 90 tablet 3  . Bacillus Coagulans-Inulin (ALIGN PREBIOTIC-PROBIOTIC PO) Take 1 tablet by mouth daily.    . budesonide-formoterol (SYMBICORT) 160-4.5 MCG/ACT inhaler Inhale 2 puffs into the lungs 2 (two) times daily.    . cetirizine (ZYRTEC) 10 MG tablet Take 10 mg by mouth daily.    . Cholecalciferol (VITAMIN D3) 2000 units capsule Take 2,000 Units by mouth daily.    . Coenzyme Q10 300 MG CAPS Take 300 mg by mouth daily.    . colesevelam (WELCHOL) 625 MG tablet TAKE 1 TABLET BY MOUTH THREE TIMES DAILY 90 tablet 2  . dicyclomine (BENTYL) 10 MG capsule TAKE 2 CAPSULES BY MOUTH 4 TIMES DAILY AS NEEDED FOR  SPASMS 120 capsule 3  . dorzolamide-timolol (COSOPT) 22.3-6.8 MG/ML ophthalmic solution Place 1 drop into both eyes 2 (two) times daily.     [name] ESTRING 2 MG vaginal ring INSERT 1 RING  INTO VAGINA EVERY 3 MONTHS 1 each 3  . Flaxseed,  Linseed, (FLAX SEEDS PO) Take 1,400 mg by mouth daily.    [name] FLUoxetine (PROZAC) 40 MG capsule Take 1 capsule (40 mg total) by mouth daily. 90 capsule 3  . glimepiride (AMARYL) 2 MG tablet Take 1 tablet (2 mg total) by mouth daily with breakfast. 90 tablet 3  . glucose blood (FREESTYLE LITE) test strip USE TO CHECK BLOOD SUGAR DAILY AND AS NEEDED 50 each 11  . lisinopril (PRINIVIL,ZESTRIL) 10 MG tablet Take 1 tablet (10 mg total) by mouth daily. (Patient taking differently: Take 10 mg by mouth every evening. ) 90 tablet 3  . LUMIGAN 0.01 %  SOLN Place 1 drop into both eyes at bedtime.     . Methylcellulose, Laxative, (CITRUCEL PO) Take 3 tablets by mouth at bedtime.    . Misc Natural Products (GRAPE SEED COMPLEX) CAPS Take 1 capsule by mouth daily.    . Multiple Vitamin (MULTIVITAMIN WITH MINERALS) TABS tablet Take 1 tablet by mouth daily.    . Omega-3 Fatty Acids (FISH OIL) 1200 MG CAPS Take 1,200 mg by mouth daily.    . Polyvinyl Alcohol-Povidone (REFRESH OP) Apply 1 drop to eye daily as needed (dry eyes).    . pramipexole (MIRAPEX) 0.5 MG tablet TAKE 1 TABLET BY MOUTH AT BEDTIME 90 tablet 1  . Respiratory Therapy Supplies (FLUTTER) DEVI Use as directed 1 each 0  . Selenium 200 MCG CAPS Take 200 mcg by mouth daily.    . temazepam (RESTORIL) 15 MG capsule Take 2 capsules (30 mg total) by mouth at bedtime as needed for sleep. 60 capsule 5  . VICTOZA 18 MG/3ML SOPN INJECT 1.8 MG INTO THE SKIN DAILY 9 pen 6  . vitamin B-12 (CYANOCOBALAMIN) 1000 MCG tablet Take 1,000 mcg by mouth daily.     No current facility-administered medications on file prior to visit.     ALLERGIES: Brimonidine; Cephalexin; Erythromycin ethylsuccinate; Levaquin [levofloxacin in d5w]; Nitrofurantoin; and Percocet [oxycodone-acetaminophen]  Family History  Problem Relation Age of Onset  . Arthritis Mother   . COPD Father   . Heart disease Father        MI 100 - states he died of lockjaw  . Hypertension Father   .  Hyperlipidemia Father   . Pancreatic cancer Paternal Grandfather   . Colon cancer Neg Hx   . Esophageal cancer Neg Hx   . Rectal cancer Neg Hx   . Stomach cancer Neg Hx     SH:  Married, non smoker  Review of Systems  All other systems reviewed and are negative.   PHYSICAL EXAMINATION:    BP 128/66 (BP Location: Right Arm, Patient Position: Sitting, Cuff Size: Large)   Pulse 88   Resp 16   Ht 5\' 11"  (1.803 m)   Wt 248 lb (112.5 kg)   LMP 05/10/1992   BMI 34.59 kg/m     General appearance: alert, cooperative and appears stated age Lymph:  no inguinal LAD noted  Pelvic: External genitalia:  no lesions              Urethra:  normal appearing urethra with no masses, tenderness or lesions              Bartholins and Skenes: normal                 Vagina: normal appearing vagina with normal color and discharge, no lesions              Estring removed and replaced without difficulty.    Assessment: Vaginal atrophy, uses Estring  Plan: Return 3-4 months for removal/replacement.

## 2018-01-29 ENCOUNTER — Encounter: Payer: Self-pay | Admitting: Obstetrics & Gynecology

## 2018-01-29 ENCOUNTER — Other Ambulatory Visit: Payer: Self-pay | Admitting: Gastroenterology

## 2018-02-01 ENCOUNTER — Other Ambulatory Visit: Payer: Self-pay | Admitting: Family Medicine

## 2018-02-03 ENCOUNTER — Ambulatory Visit: Payer: Medicare Other | Admitting: Pulmonary Disease

## 2018-02-08 ENCOUNTER — Ambulatory Visit (INDEPENDENT_AMBULATORY_CARE_PROVIDER_SITE_OTHER): Payer: Medicare Other | Admitting: Pulmonary Disease

## 2018-02-08 ENCOUNTER — Encounter: Payer: Self-pay | Admitting: Pulmonary Disease

## 2018-02-08 VITALS — BP 132/64 | HR 85 | Wt 257.0 lb

## 2018-02-08 DIAGNOSIS — K219 Gastro-esophageal reflux disease without esophagitis: Secondary | ICD-10-CM

## 2018-02-08 DIAGNOSIS — J479 Bronchiectasis, uncomplicated: Secondary | ICD-10-CM | POA: Diagnosis not present

## 2018-02-08 DIAGNOSIS — J301 Allergic rhinitis due to pollen: Secondary | ICD-10-CM | POA: Diagnosis not present

## 2018-02-08 MED ORDER — FLUTTER DEVI: 1.0000 | Freq: Once | 0 refills | Status: AC

## 2018-02-08 NOTE — Progress Notes (Signed)
Subjective:    Patient ID: [name], female    DOB: 1941-01-07, 77 y.o.   MRN: [id number]  Synopsis: First evaluated by [facility] in 2017 in the setting of a chronic cough and recurrent respiratory infections. She had many childhood illnesses, but she doesn't recall one severe childhood illness.  She was found in July 2017 on high-resolution CT scanning of the chest to have bronchiectasis and severe tracheomalacia. Immunoglobulin testing an alpha-1 testing were normal.   HPI Chief Complaint  Patient presents with  . Follow-up    productive cough (clear), shortness of breath   [name] returns to clinic today for the first time in a year.  She says that she has not had bronchitis or pneumonia in the last year.  She has not had problems with shortness of breath.  She does have a dry hacking cough and she will produce clear mucus from time to time.  On those days she typically able to cough more but these days are relatively infrequent.  She only uses the flutter valve on an as-needed basis.  Honestly she does not use it that much.  She says that she has to sleep upright to prevent having problems from her reflux.   Past Medical History:  Diagnosis Date  . Allergy   . Anemia   . Anxiety   . Arthritis    right knee;injections every 53months   . Asthma   . Bronchiectasis ([place])   . Cancer (HCC)    HX BREAST CANCER  . COMPRESSION FRACTURE, LUMBAR VERTEBRAE 08/21/2008   Qualifier: Diagnosis of  By: Arnoldo Morale MD, Balinda Quails   . Depression   . Diabetes mellitus    takes Amaryl and Januvia daily  . Difficulty sleeping   . Diverticulitis   . Early cataracts, bilateral   . Eczema   . Endometriosis   . Gastritis   . Glaucoma   . Hammer toe   . Headache(784.0)    Migraines  . Hypertension    takes Atenolol daily  . IBS (irritable bowel syndrome)   . Joint pain   . Kidney stone   . Neuropathy   . Open wound of second toe of left foot    at pre-op appt, wound appears clear  of infection 1 month post removal of toenail, no obvious exudate present nor any redness,  . Osteomyelitis ([place])    left 2nd toe  . Osteopenia   . PONV (postoperative nausea and vomiting)   . Posterior tibial tendon dysfunction    left foot  . Scoliosis   . Severe esophageal dysplasia   . 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 Thyroid disease   . Vitamin D deficiency    takes Vit d daily      Review of Systems  Constitutional: Negative for chills, fatigue and fever.  HENT: Negative for postnasal drip, rhinorrhea, sinus pressure and sore throat.   Respiratory: Positive for shortness of breath. Negative for cough and wheezing.   Cardiovascular: Negative for chest pain and leg swelling.       Objective:   Physical Exam Vitals:   02/08/18 1541  BP: 132/64  Pulse: 85  SpO2: 93%  Weight: 257 lb (116.6 kg)  RA  Gen: well appearing HENT: OP clear, TM's clear, neck supple PULM: CTA B, normal percussion CV: RRR, no mgr, trace edema GI: BS+, soft, nontender Derm: no cyanosis or rash Psyche: normal mood and affect  CBC    Component Value Date/Time   WBC 8.9 10/06/2017 1452   RBC 4.42 10/06/2017 1452   HGB 13.7 10/06/2017 1452   HCT 41.6 10/06/2017 1452   PLT 280.0 10/06/2017 1452   MCV 94.3 10/06/2017 1452   MCH 29.7 12/02/2016 0554   MCHC 32.8 10/06/2017 1452   RDW 15.1 10/06/2017 1452   LYMPHSABS 1.4 02/01/2017 1504   MONOABS 0.7 02/01/2017 1504   EOSABS 0.3 02/01/2017 1504   BASOSABS 0.0 02/01/2017 1504   PFT: July 2017 pulmonary function testing ratio 77%, FEV1 2.02 L, 71% predicted, FVC 2.63 L 70% predicted, total lung capacity 5.02 L, 81% predicted, DLCO 25.7 274% predicted       Assessment & Plan:   Bronchiectasis without complication (HCC)  Seasonal allergic rhinitis due to pollen  Gastroesophageal reflux disease, esophagitis presence not specified  Discussion: This has been another stable interval for  [name] without exacerbations of her bronchiectasis.  She is not burden significantly from chest congestion or shortness of breath so I do not see an indication for changing her chronic daily therapy for her mucociliary clearance.  That being said, I have encouraged her to use the flutter valve more frequently on days when she has more chest congestion.  Plan: For bronchiectasis: I am glad you have already had a flu shot Use the flutter valve 4 to 5 breaths, 4-5 times a day to help clear mucus out Practice good hand hygiene Stay active Call our office if you have problems with increased mucus production or shortness of breath  For allergic rhinitis: Continue generic Zyrtec and fluticasone nose spray  For gastroesophageal reflux disease: No eating within 3 hours of going to bed Follow the lifestyle modification sheet we gave you  We will see you back in 1 year or sooner if needed    Current Outpatient Medications:  .  ALPRAZolam (XANAX) 0.25 MG tablet, Take 1 tablet (0.25 mg total) by mouth daily as needed for anxiety., Disp: 30 tablet, Rfl: 0 .  atenolol (TENORMIN) 25 MG tablet, TAKE ONE TABLET BY MOUTH ONCE DAILY, Disp: 90 tablet, Rfl: 3 .  Bacillus Coagulans-Inulin (ALIGN PREBIOTIC-PROBIOTIC PO), Take 1 tablet by mouth daily., Disp: , Rfl:  .  budesonide-formoterol (SYMBICORT) 160-4.5 MCG/ACT inhaler, Inhale 2 puffs into the lungs 2 (two) times daily., Disp: , Rfl:  .  cetirizine (ZYRTEC) 10 MG tablet, Take 10 mg by mouth daily., Disp: , Rfl:  .  Cholecalciferol (VITAMIN D3) 2000 units capsule, Take 2,000 Units by mouth daily., Disp: , Rfl:  .  Coenzyme Q10 300 MG CAPS, Take 300 mg by mouth daily., Disp: , Rfl:  .  colesevelam (WELCHOL) 625 MG tablet, TAKE 1 TABLET BY MOUTH THREE TIMES DAILY, Disp: 90 tablet, Rfl: 2 .  dicyclomine (BENTYL) 10 MG capsule, TAKE 2 CAPSULES BY MOUTH 4 TIMES DAILY AS NEEDED FOR  SPASMS, Disp: 120 capsule, Rfl: 3 .  dorzolamide-timolol (COSOPT) 22.3-6.8  MG/ML ophthalmic solution, Place 1 drop into both eyes 2 (two) times daily. , Disp: , Rfl:  .  ESTRING 2 MG vaginal ring, INSERT 1 RING  INTO VAGINA EVERY 3 MONTHS, Disp: 1 each, Rfl: 3 .  Flaxseed, Linseed, (FLAX SEEDS PO), Take 1,400 mg by mouth daily., Disp: , Rfl:  .  FLUoxetine (PROZAC) 40 MG capsule, Take 1 capsule (40 mg total) by mouth daily., Disp: 90 capsule, Rfl: 3 .  glimepiride (AMARYL) 2 MG tablet, Take 1 tablet (2 mg total) by mouth daily with breakfast.,  Disp: 90 tablet, Rfl: 3 .  glucose blood (FREESTYLE LITE) test strip, USE TO CHECK BLOOD SUGAR DAILY AND AS NEEDED, Disp: 50 each, Rfl: 11 .  lisinopril (PRINIVIL,ZESTRIL) 10 MG tablet, TAKE 1 TABLET BY MOUTH ONCE DAILY, Disp: 90 tablet, Rfl: 3 .  LUMIGAN 0.01 % SOLN, Place 1 drop into both eyes at bedtime. , Disp: , Rfl:  .  Methylcellulose, Laxative, (CITRUCEL PO), Take 3 tablets by mouth at bedtime., Disp: , Rfl:  .  Misc Natural Products (GRAPE SEED COMPLEX) CAPS, Take 1 capsule by mouth daily., Disp: , Rfl:  .  Multiple Vitamin (MULTIVITAMIN WITH MINERALS) TABS tablet, Take 1 tablet by mouth daily., Disp: , Rfl:  .  Omega-3 Fatty Acids (FISH OIL) 1200 MG CAPS, Take 1,200 mg by mouth daily., Disp: , Rfl:  .  Polyvinyl Alcohol-Povidone (REFRESH OP), Apply 1 drop to eye daily as needed (dry eyes)., Disp: , Rfl:  .  pramipexole (MIRAPEX) 0.5 MG tablet, TAKE 1 TABLET BY MOUTH AT BEDTIME, Disp: 90 tablet, Rfl: 1 .  Respiratory Therapy Supplies (FLUTTER) DEVI, Use as directed, Disp: 1 each, Rfl: 0 .  Selenium 200 MCG CAPS, Take 200 mcg by mouth daily., Disp: , Rfl:  .  temazepam (RESTORIL) 15 MG capsule, Take 2 capsules (30 mg total) by mouth at bedtime as needed for sleep., Disp: 60 capsule, Rfl: 5 .  VICTOZA 18 MG/3ML SOPN, INJECT 1.8 MG INTO THE SKIN DAILY, Disp: 9 pen, Rfl: 6 .  vitamin B-12 (CYANOCOBALAMIN) 1000 MCG tablet, Take 1,000 mcg by mouth daily., Disp: , Rfl:  .  Respiratory Therapy Supplies (FLUTTER) DEVI, 1 Device by  Does not apply route once for 1 dose., Disp: 1 each, Rfl: 0

## 2018-02-08 NOTE — Patient Instructions (Signed)
For bronchiectasis: I am glad you have already had a flu shot Use the flutter valve 4 to 5 breaths, 4-5 times a day to help clear mucus out Practice good hand hygiene Stay active Call our office if you have problems with increased mucus production or shortness of breath  For allergic rhinitis: Continue generic Zyrtec and fluticasone nose spray  For gastroesophageal reflux disease: No eating within 3 hours of going to bed Follow the lifestyle modification sheet we gave you  We will see you back in 1 year or sooner if needed

## 2018-02-17 ENCOUNTER — Other Ambulatory Visit: Payer: Self-pay | Admitting: Pulmonary Disease

## 2018-02-21 ENCOUNTER — Other Ambulatory Visit: Payer: Self-pay | Admitting: Gastroenterology

## 2018-03-01 DIAGNOSIS — L84 Corns and callosities: Secondary | ICD-10-CM | POA: Diagnosis not present

## 2018-03-01 DIAGNOSIS — E1151 Type 2 diabetes mellitus with diabetic peripheral angiopathy without gangrene: Secondary | ICD-10-CM | POA: Diagnosis not present

## 2018-03-01 DIAGNOSIS — L603 Nail dystrophy: Secondary | ICD-10-CM | POA: Diagnosis not present

## 2018-03-01 DIAGNOSIS — I739 Peripheral vascular disease, unspecified: Secondary | ICD-10-CM | POA: Diagnosis not present

## 2018-03-16 ENCOUNTER — Ambulatory Visit (INDEPENDENT_AMBULATORY_CARE_PROVIDER_SITE_OTHER): Payer: Medicare Other | Admitting: Psychology

## 2018-03-16 DIAGNOSIS — F321 Major depressive disorder, single episode, moderate: Secondary | ICD-10-CM

## 2018-03-23 DIAGNOSIS — D1801 Hemangioma of skin and subcutaneous tissue: Secondary | ICD-10-CM | POA: Diagnosis not present

## 2018-03-23 DIAGNOSIS — L814 Other melanin hyperpigmentation: Secondary | ICD-10-CM | POA: Diagnosis not present

## 2018-03-23 DIAGNOSIS — L821 Other seborrheic keratosis: Secondary | ICD-10-CM | POA: Diagnosis not present

## 2018-03-23 DIAGNOSIS — L218 Other seborrheic dermatitis: Secondary | ICD-10-CM | POA: Diagnosis not present

## 2018-03-23 DIAGNOSIS — D2272 Melanocytic nevi of left lower limb, including hip: Secondary | ICD-10-CM | POA: Diagnosis not present

## 2018-03-29 ENCOUNTER — Other Ambulatory Visit: Payer: Self-pay | Admitting: Family Medicine

## 2018-03-30 ENCOUNTER — Ambulatory Visit: Payer: Medicare Other | Admitting: Psychology

## 2018-04-07 ENCOUNTER — Telehealth: Payer: Self-pay | Admitting: Pulmonary Disease

## 2018-04-07 NOTE — Telephone Encounter (Signed)
Called and spoke with patient she is aware and verbalized understanding. Nothing further needed.  

## 2018-04-07 NOTE — Telephone Encounter (Signed)
Sorry to hear the patient has congestion  Patient was not diagnosed with bronchitis she was diagnosed with bronchiectasis there is a difference.  Bronchiectasis is from dilated airways.  She is likely going to have always a baseline cough with some mucus production.  Bronchiectasis: This is the medical term which indicates that you have damage, dilated airways making you more susceptible to respiratory infection. Use a flutter valve 10 breaths twice a day or 4 to 5 breaths 4-5 times a day to help clear mucus out Let us know if you have cough with change in mucus color or fevers or chills.  At that point you would need an antibiotic. Maintain a healthy nutritious diet, eating whole foods Take your medications as prescribed    If patient would like she is more than welcome to start taking regular Mucinex.  She can take 600 mg twice a day.  Drink fluids.  Ensure patient has a flutter valve if not I am more than happy to prescribe the patient a flutter valve for her to use.  Make sure the patient has been using her flutter valve regularly as indicated above.  Wyn Quaker FNP

## 2018-04-07 NOTE — Telephone Encounter (Signed)
Called and spoke with patient, she stated that she has been told she has bronchitis but was not prescribed anything. She is coughing up thick clear mucus and has a lot of congestion in her chest. She would like for something to be called in.  Aaron Edelman please advise, patient denied appointment

## 2018-04-11 ENCOUNTER — Ambulatory Visit (INDEPENDENT_AMBULATORY_CARE_PROVIDER_SITE_OTHER)
Admission: RE | Admit: 2018-04-11 | Discharge: 2018-04-11 | Disposition: A | Discharge status: Home / Self Care | Payer: Medicare Other | Source: Ambulatory Visit | Attending: Pulmonary Disease | Admitting: Pulmonary Disease

## 2018-04-11 ENCOUNTER — Encounter: Payer: Self-pay | Admitting: Pulmonary Disease

## 2018-04-11 ENCOUNTER — Ambulatory Visit (INDEPENDENT_AMBULATORY_CARE_PROVIDER_SITE_OTHER): Payer: Medicare Other | Admitting: Pulmonary Disease

## 2018-04-11 VITALS — BP 110/62 | HR 87 | Temp 98.7°F | Ht 72.0 in | Wt 259.0 lb

## 2018-04-11 DIAGNOSIS — J471 Bronchiectasis with (acute) exacerbation: Secondary | ICD-10-CM | POA: Diagnosis not present

## 2018-04-11 DIAGNOSIS — R05 Cough: Secondary | ICD-10-CM | POA: Diagnosis not present

## 2018-04-11 DIAGNOSIS — R0602 Shortness of breath: Secondary | ICD-10-CM | POA: Diagnosis not present

## 2018-04-11 MED ORDER — IPRATROPIUM-ALBUTEROL 0.5-2.5 (3) MG/3ML IN SOLN
3.0000 mL | Freq: Once | RESPIRATORY_TRACT | Status: AC
  Administered 2018-04-11: 3 mL via RESPIRATORY_TRACT

## 2018-04-11 MED ORDER — PREDNISONE 10 MG PO TABS: ORAL_TABLET | ORAL | 0 refills | Status: DC

## 2018-04-11 MED ORDER — AMOXICILLIN-POT CLAVULANATE 875-125 MG PO TABS: 1.0000 | ORAL_TABLET | Freq: Two times a day (BID) | ORAL | 0 refills | Status: DC

## 2018-04-11 MED ORDER — LEVALBUTEROL HCL 0.63 MG/3ML IN NEBU: 0.6300 mg | INHALATION_SOLUTION | Freq: Four times a day (QID) | RESPIRATORY_TRACT | 2 refills | Status: DC | PRN

## 2018-04-11 NOTE — Assessment & Plan Note (Signed)
Duoneb today   Chest x-ray today >>>go to [facility] office in the basement  Augmentin >>> Take 1 875-125 mg tablet every 12 hours for the next 7 days >>> Take with food  Prednisone 10mg  tablet  >>>Take 2 tablets (20 mg total) daily for the next 5 days >>> Take with food in the morning  Continue Symbicort 160 >>> 2 puffs in the morning right when you wake up, rinse out your mouth after use, 12 hours later 2 puffs, rinse after use >>> Take this daily, no matter what >>> This is not a rescue inhaler   Bronchiectasis: This is the medical term which indicates that you have damage, dilated airways making you more susceptible to respiratory infection. Use a flutter valve 10 breaths twice a day or 4 to 5 breaths 4-5 times a day to help clear mucus out Let us know if you have cough with change in mucus color or fevers or chills.  At that point you would need an antibiotic. Maintain a healthy nutritious diet, eating whole foods Take your medications as prescribed   Nebulizer ordered  >>> Xopenex breathing treatments can use every 6 hours as needed for shortness of breath and wheezing  Follow up in 4 weeks to ensure symptoms are improving

## 2018-04-11 NOTE — Progress Notes (Signed)
Your chest x-ray results of come back.  Showing no acute changes.  No plan of care changes at this time.  Keep follow-up appointment.    Follow-up with our office if symptoms worsen or you do not feel like you are improving under her current regimen.  It was a pleasure taking care of you,  Brian Mack, FNP 

## 2018-04-11 NOTE — Patient Instructions (Addendum)
Duoneb today   Chest x-ray today >>>go to [facility] office in the basement  Augmentin >>> Take 1 875-125 mg tablet every 12 hours for the next 7 days >>> Take with food  Prednisone 10mg  tablet  >>>Take 2 tablets (20 mg total) daily for the next 5 days >>> Take with food in the morning  Continue Symbicort 160 >>> 2 puffs in the morning right when you wake up, rinse out your mouth after use, 12 hours later 2 puffs, rinse after use >>> Take this daily, no matter what >>> This is not a rescue inhaler   Bronchiectasis: This is the medical term which indicates that you have damage, dilated airways making you more susceptible to respiratory infection. Use a flutter valve 10 breaths twice a day or 4 to 5 breaths 4-5 times a day to help clear mucus out Let us know if you have cough with change in mucus color or fevers or chills.  At that point you would need an antibiotic. Maintain a healthy nutritious diet, eating whole foods Take your medications as prescribed   Nebulizer ordered  >>> Xopenex breathing treatments can use every 6 hours as needed for shortness of breath and wheezing    Follow up in 4 weeks to ensure symptoms are improving     It is flu season:   >>>Remember to be washing your hands regularly, using hand sanitizer, be careful to use around herself with has contact with people who are sick will increase her chances of getting sick yourself. >>> Best ways to protect herself from the flu: Receive the yearly flu vaccine, practice good hand hygiene washing with soap and also using hand sanitizer when available, eat a nutritious meals, get adequate rest, hydrate appropriately   Please contact the office if your symptoms worsen or you have concerns that you are not improving.   Thank you for choosing [facility] for your healthcare, and for allowing [name] to partner with you on your healthcare journey. I am thankful to be able to provide care to you today.   Olivia Quaker  FNP-C

## 2018-04-11 NOTE — Progress Notes (Signed)
@Patient  ID: [name], female    DOB: 1940/09/03, 77 y.o.   MRN: [id number]  Chief Complaint  Patient presents with  . Acute Visit    Cough, fatigue    Referring provider: Marin Olp, MD  HPI:  77 year old female never smoker followed in our office for bronchiectasis and tracheomalacia  PMH: GERD, IBS, type 2 diabetes, hypertension, chronic kidney disease stage II Smoker/ Smoking History: Never smoker Maintenance:  Symbicort 160 Pt of: Dr. Lake Bells   04/11/2018  - Visit   77 year old female patient presents to our office for acute visit.  Patient reports she has had progressive worsening cough over the last week which started on 04/05/2018.  Patient reports that cough has always been productive but is traditionally clear sputum, patient sputum has now changed to yellow and is thicker.  Patient reports increased sputum production.  Patient reports that over the weekend she is had increased wheezing on exertion as well as at rest.  Patient states she schedule the office visit today because wheezing had worsened and Dr. Lake Bells told her to schedule an office visit if her sputum color changes.  Patient has been using Mucinex and her flutter valve 4-5 times a day 5 breaths each time.  Patient reports adherence to Symbicort 160.  Patient reports she is been using her rescue inhaler 2-3 times a day.   Tests:   PFT: July 2017 pulmonary function testing ratio 77%, FEV1 2.02 L, 71% predicted, FVC 2.63 L 70% predicted, total lung capacity 5.02 L, 81% predicted, DLCO 25.7 74% predicted  11/2015 CT chest > no ILD, air trapping noted, mild cylindrical bronchiectasis, severe tracheomalacia noted, aortic calcification  12/2015 immunoglobulin profile normal, Alpha-1 antitrypsin profile normal  FENO:  No results found for: NITRICOXIDE  PFT: PFT Results Latest Ref Rng & Units 12/02/2015  FVC-Pre L 2.52  FVC-Predicted Pre % 67  FVC-Post L 2.63  FVC-Predicted Post % 70  Pre FEV1/FVC  % % 77  Post FEV1/FCV % % 77  FEV1-Pre L 1.94  FEV1-Predicted Pre % 68  FEV1-Post L 2.02  DLCO UNC% % 74  DLCO COR %Predicted % 94  TLC L 5.02  TLC % Predicted % 81  RV % Predicted % 91    Imaging: No results found.  Chart Review:    Specialty Problems      Pulmonary Problems   Bronchiectasis with acute exacerbation ([place])    11/2015 CT chest > no ILD, air trapping noted, mild cylindrical bronchiectasis, severe tracheomalacia noted, aortic calcification 12/2015 immunoglobulin profile normal, Alpha-1 antitrypsin profile normal      Tracheomalacia    11/2015 CT chest > no ILD, air trapping noted, mild cylindrical bronchiectasis, severe tracheomalacia noted, aortic calcification      Solitary pulmonary nodule      Allergies  Allergen Reactions  . Brimonidine     Eyes and around eyes red  . Cephalexin Diarrhea    Patient can't remember reaction (per chart at [facility] states diarrhea)  . Erythromycin Ethylsuccinate Hives and Diarrhea  . Levaquin [Levofloxacin In D5w] Other (See Comments)    Pain in tendons   . Nitrofurantoin Other (See Comments)    Severe headache  . Percocet [Oxycodone-Acetaminophen] Itching   >>> Patient denies allergies to DuoNeb's as well as Augmentin  Immunization History  Administered Date(s) Administered  . H1N1 06/05/2008  . Influenza Split 02/07/2012  . Influenza Whole 03/10/2000, 01/24/2008, 03/10/2009, 01/20/2010  . Influenza, High Dose Seasonal PF 02/12/2013, 01/31/2017, 01/19/2018  .  Influenza,inj,Quad PF,6+ Mos 01/17/2014, 03/27/2015  . Influenza-Unspecified 02/09/2016  . Pneumococcal Conjugate-13 01/17/2014  . Pneumococcal Polysaccharide-23 03/10/2000, 05/02/2007, 01/02/2012  . Td 05/10/1992, 12/25/2007    Past Medical History:  Diagnosis Date  . Allergy   . Anemia   . Anxiety   . Arthritis    right knee;injections every 28months   . Asthma   . Bronchiectasis ([place])   . Cancer (HCC)    HX BREAST CANCER  . COMPRESSION FRACTURE,  LUMBAR VERTEBRAE 08/21/2008   Qualifier: Diagnosis of  By: Arnoldo Morale MD, Balinda Quails   . Depression   . Diabetes mellitus    takes Amaryl and Januvia daily  . Difficulty sleeping   . Diverticulitis   . Early cataracts, bilateral   . Eczema   . Endometriosis   . Gastritis   . Glaucoma   . Hammer toe   . Headache(784.0)    Migraines  . Hypertension    takes Atenolol daily  . IBS (irritable bowel syndrome)   . Joint pain   . Kidney stone   . Neuropathy   . Open wound of second toe of left foot    at pre-op appt, wound appears clear of infection 1 month post removal of toenail, no obvious exudate present nor any redness,  . Osteomyelitis ([place])    left 2nd toe  . Osteopenia   . PONV (postoperative nausea and vomiting)   . Posterior tibial tendon dysfunction    left foot  . Scoliosis   . Severe esophageal dysplasia   . 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 Thyroid disease   . Vitamin D deficiency    takes Vit d daily    Tobacco History: Social History   Tobacco Use  Smoking Status Never Smoker  Smokeless Tobacco Never Used   Counseling given: Yes  Continue to not smoke  Outpatient Encounter Medications as of 04/11/2018  Medication Sig  . ALPRAZolam (XANAX) 0.25 MG tablet Take 1 tablet (0.25 mg total) by mouth daily as needed for anxiety.  [name] atenolol (TENORMIN) 25 MG tablet TAKE 1 TABLET BY MOUTH ONCE DAILY  . Bacillus Coagulans-Inulin (ALIGN PREBIOTIC-PROBIOTIC PO) Take 1 tablet by mouth daily.  . budesonide-formoterol (SYMBICORT) 160-4.5 MCG/ACT inhaler Inhale 2 puffs into the lungs 2 (two) times daily.  . cetirizine (ZYRTEC) 10 MG tablet Take 10 mg by mouth daily.  . Cholecalciferol (VITAMIN D3) 2000 units capsule Take 2,000 Units by mouth daily.  . Coenzyme Q10 300 MG CAPS Take 300 mg by mouth daily.  . colesevelam (WELCHOL) 625 MG tablet TAKE 1 TABLET BY MOUTH THREE TIMES DAILY  . dicyclomine (BENTYL) 10 MG capsule TAKE 2 CAPSULES  BY MOUTH 4 TIMES DAILY AS NEEDED FOR  SPASMS  . dorzolamide-timolol (COSOPT) 22.3-6.8 MG/ML ophthalmic solution Place 1 drop into both eyes 2 (two) times daily.   [name] ESTRING 2 MG vaginal ring INSERT 1 RING  INTO VAGINA EVERY 3 MONTHS  . Flaxseed, Linseed, (FLAX SEEDS PO) Take 1,400 mg by mouth daily.  [name] FLUoxetine (PROZAC) 40 MG capsule Take 1 capsule (40 mg total) by mouth daily.  [name] glimepiride (AMARYL) 2 MG tablet Take 1 tablet (2 mg total) by mouth daily with breakfast.  . glucose blood (FREESTYLE LITE) test strip USE TO CHECK BLOOD SUGAR DAILY AND AS NEEDED  . lisinopril (PRINIVIL,ZESTRIL) 10 MG tablet TAKE 1 TABLET BY MOUTH ONCE DAILY  . LUMIGAN 0.01 % SOLN Place 1 drop into both  eyes at bedtime.   . Methylcellulose, Laxative, (CITRUCEL PO) Take 3 tablets by mouth at bedtime.  . Misc Natural Products (GRAPE SEED COMPLEX) CAPS Take 1 capsule by mouth daily.  . Multiple Vitamin (MULTIVITAMIN WITH MINERALS) TABS tablet Take 1 tablet by mouth daily.  . Omega-3 Fatty Acids (FISH OIL) 1200 MG CAPS Take 1,200 mg by mouth daily.  . Polyvinyl Alcohol-Povidone (REFRESH OP) Apply 1 drop to eye daily as needed (dry eyes).  . pramipexole (MIRAPEX) 0.5 MG tablet TAKE 1 TABLET BY MOUTH AT BEDTIME  . Respiratory Therapy Supplies (FLUTTER) DEVI Use as directed  . Selenium 200 MCG CAPS Take 200 mcg by mouth daily.  . SYMBICORT 160-4.5 MCG/ACT inhaler INHALE 2 PUFFS BY MOUTH TWICE DAILY  . temazepam (RESTORIL) 15 MG capsule Take 2 capsules (30 mg total) by mouth at bedtime as needed for sleep.  [name] VICTOZA 18 MG/3ML SOPN INJECT 1.8 MG INTO THE SKIN DAILY  . vitamin B-12 (CYANOCOBALAMIN) 1000 MCG tablet Take 1,000 mcg by mouth daily.  [name] amoxicillin-clavulanate (AUGMENTIN) 875-125 MG tablet Take 1 tablet by mouth 2 (two) times daily.  [name] levalbuterol (XOPENEX) 0.63 MG/3ML nebulizer solution Take 3 mLs (0.63 mg total) by nebulization every 6 (six) hours as needed for wheezing or shortness of breath.  . predniSONE  (DELTASONE) 10 MG tablet Take 2 tablets (20mg  total) daily for the next 5 days. Take in the AM with food.  . [EXPIRED] ipratropium-albuterol (DUONEB) 0.5-2.5 (3) MG/3ML nebulizer solution 3 mL    No facility-administered encounter medications on file as of 04/11/2018.      Review of Systems  Review of Systems  Constitutional: Positive for fatigue. Negative for chills, fever and unexpected weight change.  HENT: Positive for congestion, postnasal drip and sneezing. Negative for ear pain, sinus pressure and sinus pain.   Respiratory: Positive for cough (Yellow mucus, this is a change from her clear mucus she traditionally has a baseline) and wheezing (with exertion and with rest ). Negative for chest tightness and shortness of breath.   Cardiovascular: Negative for chest pain, palpitations and leg swelling.  Gastrointestinal: Positive for diarrhea (this is baseline per pt ). Negative for blood in stool, nausea and vomiting.  Musculoskeletal: Negative for arthralgias.  Skin: Negative for color change.  Allergic/Immunologic: Positive for environmental allergies. Negative for food allergies.  Neurological: Negative for dizziness, light-headedness and headaches.  Psychiatric/Behavioral: Negative for dysphoric mood. The patient is not nervous/anxious.   All other systems reviewed and are negative.    Physical Exam  BP 110/62 (BP Location: Right Arm, Cuff Size: Normal)   Pulse 87   Temp 98.7 F (37.1 C) (Oral)   Ht 6' (1.829 m)   Wt 259 lb (117.5 kg)   LMP 05/10/1992   SpO2 91%   BMI 35.13 kg/m   Wt Readings from Last 5 Encounters:  04/11/18 259 lb (117.5 kg)  02/08/18 257 lb (116.6 kg)  01/26/18 248 lb (112.5 kg)  01/19/18 248 lb (112.5 kg)  12/29/17 245 lb (111.1 kg)   >>>discussed weight increase  Physical Exam  Constitutional: She is oriented to person, place, and time and well-developed, well-nourished, and in no distress. No distress.  HENT:  Head: Normocephalic and  atraumatic.  Right Ear: Hearing and external ear normal.  Left Ear: Hearing and external ear normal.  Nose: Nose normal. Right sinus exhibits no maxillary sinus tenderness and no frontal sinus tenderness. Left sinus exhibits no maxillary sinus tenderness and no frontal sinus tenderness.  Eyes:  Pupils are equal, round, and reactive to light.  Neck: Normal range of motion. Neck supple. No JVD present.  Cardiovascular: Normal rate, regular rhythm and normal heart sounds.  Pulmonary/Chest: Effort normal. No accessory muscle usage. No respiratory distress. She has no decreased breath sounds. She has wheezes (exp wheeze). She has rhonchi (L > R ).  Abdominal: Soft. Bowel sounds are normal. There is no tenderness.  Musculoskeletal: Normal range of motion. She exhibits no edema.  Lymphadenopathy:    She has no cervical adenopathy.  Neurological: She is alert and oriented to person, place, and time. Gait normal.  Skin: Skin is warm and dry. She is not diaphoretic. No erythema.  Psychiatric: Mood, memory, affect and judgment normal.  Nursing note and vitals reviewed.    Lab Results:  CBC    Component Value Date/Time   WBC 8.9 10/06/2017 1452   RBC 4.42 10/06/2017 1452   HGB 13.7 10/06/2017 1452   HCT 41.6 10/06/2017 1452   PLT 280.0 10/06/2017 1452   MCV 94.3 10/06/2017 1452   MCH 29.7 12/02/2016 0554   MCHC 32.8 10/06/2017 1452   RDW 15.1 10/06/2017 1452   LYMPHSABS 1.4 02/01/2017 1504   MONOABS 0.7 02/01/2017 1504   EOSABS 0.3 02/01/2017 1504   BASOSABS 0.0 02/01/2017 1504    BMET    Component Value Date/Time   NA 141 10/06/2017 1452   K 4.9 10/06/2017 1452   CL 101 10/06/2017 1452   CO2 30 10/06/2017 1452   GLUCOSE 127 (H) 10/06/2017 1452   BUN 12 10/06/2017 1452   CREATININE 0.67 10/06/2017 1452   CREATININE 0.75 06/05/2013 1558   CALCIUM 9.7 10/06/2017 1452   GFRNONAA >60 12/01/2016 0529   GFRAA >60 12/01/2016 0529    BNP No results found for: BNP  ProBNP      Component Value Date/Time   PROBNP 22.0 04/17/2009 1311      Assessment & Plan:   Pleasant 77 year old female patient completing follow-up with our office today.  Patient with bronchiectatic flare.  We will treat with Augmentin and short course of prednisone.  Patient believes she had benefit from nebulized medications.  Will write for patient have nebulizer at home as well as Xopenex breathing treatments.  Patient to follow-up in 4 weeks to ensure symptoms are improving.  Bronchiectasis with acute exacerbation ([place]) Duoneb today   Chest x-ray today >>>go to [facility] office in the basement  Augmentin >>> Take 1 875-125 mg tablet every 12 hours for the next 7 days >>> Take with food  Prednisone 10mg  tablet  >>>Take 2 tablets (20 mg total) daily for the next 5 days >>> Take with food in the morning  Continue Symbicort 160 >>> 2 puffs in the morning right when you wake up, rinse out your mouth after use, 12 hours later 2 puffs, rinse after use >>> Take this daily, no matter what >>> This is not a rescue inhaler   Bronchiectasis: This is the medical term which indicates that you have damage, dilated airways making you more susceptible to respiratory infection. Use a flutter valve 10 breaths twice a day or 4 to 5 breaths 4-5 times a day to help clear mucus out Let us know if you have cough with change in mucus color or fevers or chills.  At that point you would need an antibiotic. Maintain a healthy nutritious diet, eating whole foods Take your medications as prescribed   Nebulizer ordered  >>> Xopenex breathing treatments can use every 6 hours  as needed for shortness of breath and wheezing  Follow up in 4 weeks to ensure symptoms are improving    This appointment was 29 minutes along with over 50% of the time direct face-to-face patient care, assessment, plan of care follow-up   Lauraine Rinne, NP 04/11/2018

## 2018-04-12 NOTE — Progress Notes (Signed)
Reviewed, agree 

## 2018-04-13 ENCOUNTER — Ambulatory Visit (INDEPENDENT_AMBULATORY_CARE_PROVIDER_SITE_OTHER): Payer: Medicare Other | Admitting: Psychology

## 2018-04-13 DIAGNOSIS — F321 Major depressive disorder, single episode, moderate: Secondary | ICD-10-CM | POA: Diagnosis not present

## 2018-04-19 ENCOUNTER — Other Ambulatory Visit: Payer: Self-pay | Admitting: Family Medicine

## 2018-04-25 ENCOUNTER — Ambulatory Visit: Payer: Medicare Other | Admitting: Family Medicine

## 2018-04-25 ENCOUNTER — Encounter: Payer: Self-pay | Admitting: Family Medicine

## 2018-04-26 ENCOUNTER — Other Ambulatory Visit: Payer: Self-pay | Admitting: Family Medicine

## 2018-04-26 NOTE — Telephone Encounter (Signed)
Last OV 12/29/17 Last refill 10/26/17 #90/1

## 2018-04-28 ENCOUNTER — Ambulatory Visit (INDEPENDENT_AMBULATORY_CARE_PROVIDER_SITE_OTHER): Payer: Medicare Other | Admitting: Obstetrics & Gynecology

## 2018-04-28 VITALS — BP 120/60 | HR 92 | Resp 16 | Ht 72.0 in | Wt 250.0 lb

## 2018-04-28 DIAGNOSIS — N952 Postmenopausal atrophic vaginitis: Secondary | ICD-10-CM

## 2018-04-28 MED ORDER — ALPRAZOLAM 0.25 MG PO TABS: 0.2500 mg | ORAL_TABLET | Freq: Every day | ORAL | 0 refills | Status: DC | PRN

## 2018-04-28 NOTE — Progress Notes (Signed)
GYNECOLOGY  VISIT  CC:   Estring exchange   HPI: 77 y.o. G0P0 Married White or Caucasian female here for removal and replacement of her Estring.  She cannot do this on her own.  She just cannot reach the Estring.  Denies vaginal bleeding or vaginal discharge.  Grandson living with them again.  His mother just cannot seem to get him to school and take adequate care of him.  Huge stressor for Mrs. Owens Shark.  GYNECOLOGIC HISTORY: Patient's last menstrual period was 05/10/1992. Contraception: PMP Menopausal hormone therapy: Estring   Patient Active Problem List   Diagnosis Date Noted  . History of total knee replacement, bilateral 11/14/2017  . History of adenomatous polyp of colon 05/18/2017  . Diarrhea   . Polyp of ascending colon   . Arthritis of midfoot 03/03/2016  . Hammertoes of both feet 03/03/2016  . Aortic atherosclerosis ([place]) 02/16/2016  . Solitary pulmonary nodule 01/08/2016  . 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  . Bronchiectasis with acute exacerbation ([place]) 10/18/2015  . OA (osteoarthritis) of knee 12/30/2014  . IBS (irritable bowel syndrome) 07/17/2014  . CKD (chronic kidney disease), stage II 01/22/2014  . Depression, major, single episode, in partial remission ([place]) 01/03/2014  . Posterior tibial tendon dysfunction 01/03/2014  . Osteoarthritis, knee 01/03/2014  . Glaucoma 01/03/2014  . Essential hypertension, benign 01/03/2014  . Obesity (BMI 30-39.9) 06/15/2013  . Primary open-angle glaucoma 08/02/2012  . Foot tendinitis 07/20/2010  . INSOMNIA, CHRONIC 07/25/2009  . Fatty liver 03/18/2009  . GERD 12/25/2007  . Hyperlipidemia 07/26/2007  . ANEMIA, B12 DEFICIENCY 12/29/2006  . Diabetes mellitus type II, controlled ([place]) 12/28/2006  . RESTLESS LEG SYNDROME, MILD 12/28/2006  . NEUROPATHY, IDIOPATHIC PERIPHERAL NEC 12/28/2006  . OSTEOPOROSIS 12/12/2006  . BREAST CANCER, HX OF 12/12/2006    Past Medical History:  Diagnosis Date  . Allergy   . Anemia   . Anxiety   .  Arthritis    right knee;injections every 91months   . Asthma   . Bronchiectasis ([place])   . Cancer (HCC)    HX BREAST CANCER  . COMPRESSION FRACTURE, LUMBAR VERTEBRAE 08/21/2008   Qualifier: Diagnosis of  By: Arnoldo Morale MD, Balinda Quails   . Depression   . Diabetes mellitus    takes Amaryl and Januvia daily  . Difficulty sleeping   . Diverticulitis   . Early cataracts, bilateral   . Eczema   . Endometriosis   . Gastritis   . Glaucoma   . Hammer toe   . Headache(784.0)    Migraines  . Hypertension    takes Atenolol daily  . IBS (irritable bowel syndrome)   . Joint pain   . Kidney stone   . Neuropathy   . Open wound of second toe of left foot    at pre-op appt, wound appears clear of infection 1 month post removal of toenail, no obvious exudate present nor any redness,  . Osteomyelitis ([place])    left 2nd toe  . Osteopenia   . PONV (postoperative nausea and vomiting)   . Posterior tibial tendon dysfunction    left foot  . Scoliosis   . Severe esophageal dysplasia   . 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 Thyroid disease   . Vitamin D deficiency    takes Vit d daily    Past Surgical History:  Procedure Laterality Date  . ADENOIDECTOMY     at age 38  . biopsy on back  benign  . CATARACT EXTRACTION    . CHOLECYSTECTOMY  06/2008  . COLONOSCOPY  11/02/05  . COLONOSCOPY WITH PROPOFOL N/A 05/17/2017   Procedure: COLONOSCOPY WITH PROPOFOL;  Surgeon: Mauri Pole, MD;  Location: [facility];  Service: Endoscopy;  Laterality: N/A;  PT WILL BE ADMITTED THE DAY BEFORE FOR PREP PER ROBIN KB  . DILATION AND CURETTAGE OF UTERUS    . ENDOMETRIAL BIOPSY  06/22/2011   benign polyp, no hyperplasia  . excision on breast     internal infected suture from breast surgery  . excision removed from neck     infected lymph node  . FOOT SURGERY     left foot-cleaning out areas on toes at the wound center-having MRI to determine if osteomyelitis  .  HYSTEROSCOPY  06/22/11   PMB submucosal myoma  . JOINT REPLACEMENT     left total shoulder  . LAPAROSCOPIC OOPHORECTOMY Right 12/2004   absent LSO  . LAPAROTOMY    . MASTECTOMY Bilateral 1994 right, 1995 left  . SHOULDER HEMI-ARTHROPLASTY  01/01/2012   Procedure: SHOULDER HEMI-ARTHROPLASTY;  Surgeon: Roseanne Kaufman, MD;  Location: [facility];  Service: Orthopedics;  Laterality: Left;  Left Shoulder Hemi Arthroplasty with Repair and Reconstruction as Necessary   . THYROIDECTOMY     56yrs ago. follows endocrine  . TONSILLECTOMY    . TOTAL KNEE ARTHROPLASTY Right 12/30/2014   Procedure: RIGHT TOTAL KNEE ARTHROPLASTY;  Surgeon: Gaynelle Arabian, MD;  Location: [facility];  Service: Orthopedics;  Laterality: Right;  . TOTAL KNEE ARTHROPLASTY Left 11/29/2016   Procedure: LEFT TOTAL KNEE ARTHROPLASTY;  Surgeon: Gaynelle Arabian, MD;  Location: [facility];  Service: Orthopedics;  Laterality: Left;    MEDS:   Current Outpatient Medications on File Prior to Visit  Medication Sig Dispense Refill  . ALPRAZolam (XANAX) 0.25 MG tablet Take 1 tablet (0.25 mg total) by mouth daily as needed for anxiety. 30 tablet 0  . atenolol (TENORMIN) 25 MG tablet TAKE 1 TABLET BY MOUTH ONCE DAILY 90 tablet 3  . Bacillus Coagulans-Inulin (ALIGN PREBIOTIC-PROBIOTIC PO) Take 1 tablet by mouth daily.    . budesonide-formoterol (SYMBICORT) 160-4.5 MCG/ACT inhaler Inhale 2 puffs into the lungs 2 (two) times daily.    . cetirizine (ZYRTEC) 10 MG tablet Take 10 mg by mouth daily.    . Cholecalciferol (VITAMIN D3) 2000 units capsule Take 2,000 Units by mouth daily.    . Coenzyme Q10 300 MG CAPS Take 300 mg by mouth daily.    . colesevelam (WELCHOL) 625 MG tablet TAKE 1 TABLET BY MOUTH THREE TIMES DAILY 90 tablet 2  . dicyclomine (BENTYL) 10 MG capsule TAKE 2 CAPSULES BY MOUTH 4 TIMES DAILY AS NEEDED FOR  SPASMS 120 capsule 3  . dorzolamide-timolol (COSOPT) 22.3-6.8 MG/ML ophthalmic solution Place 1 drop into both eyes 2 (two) times daily.     [name]  ESTRING 2 MG vaginal ring INSERT 1 RING  INTO VAGINA EVERY 3 MONTHS 1 each 3  . Flaxseed, Linseed, (FLAX SEEDS PO) Take 1,400 mg by mouth daily.    [name] FLUoxetine (PROZAC) 40 MG capsule TAKE 1 CAPSULE BY MOUTH ONCE DAILY. 90 capsule 3  . glimepiride (AMARYL) 2 MG tablet Take 1 tablet (2 mg total) by mouth daily with breakfast. 90 tablet 3  . glucose blood (FREESTYLE LITE) test strip USE TO CHECK BLOOD SUGAR DAILY AND AS NEEDED 50 each 11  . levalbuterol (XOPENEX) 0.63 MG/3ML nebulizer solution Take 3 mLs (0.63 mg total) by nebulization every 6 (  six) hours as needed for wheezing or shortness of breath. 360 mL 2  . lisinopril (PRINIVIL,ZESTRIL) 10 MG tablet TAKE 1 TABLET BY MOUTH ONCE DAILY 90 tablet 3  . LUMIGAN 0.01 % SOLN Place 1 drop into both eyes at bedtime.     . Methylcellulose, Laxative, (CITRUCEL PO) Take 3 tablets by mouth at bedtime.    . Misc Natural Products (GRAPE SEED COMPLEX) CAPS Take 1 capsule by mouth daily.    . Multiple Vitamin (MULTIVITAMIN WITH MINERALS) TABS tablet Take 1 tablet by mouth daily.    . Omega-3 Fatty Acids (FISH OIL) 1200 MG CAPS Take 1,200 mg by mouth daily.    . Polyvinyl Alcohol-Povidone (REFRESH OP) Apply 1 drop to eye daily as needed (dry eyes).    . pramipexole (MIRAPEX) 0.5 MG tablet TAKE 1 TABLET BY MOUTH AT BEDTIME 90 tablet 0  . Respiratory Therapy Supplies (FLUTTER) DEVI Use as directed 1 each 0  . Selenium 200 MCG CAPS Take 200 mcg by mouth daily.    . temazepam (RESTORIL) 15 MG capsule Take 2 capsules (30 mg total) by mouth at bedtime as needed for sleep. 60 capsule 5  . VICTOZA 18 MG/3ML SOPN INJECT 1.8 MG INTO THE SKIN DAILY 9 pen 6  . vitamin B-12 (CYANOCOBALAMIN) 1000 MCG tablet Take 1,000 mcg by mouth daily.     No current facility-administered medications on file prior to visit.     ALLERGIES: Brimonidine; Cephalexin; Erythromycin ethylsuccinate; Levaquin [levofloxacin in d5w]; Nitrofurantoin; and Percocet [oxycodone-acetaminophen]  Family  History  Problem Relation Age of Onset  . Arthritis Mother   . COPD Father   . Heart disease Father        MI 71 - states he died of lockjaw  . Hypertension Father   . Hyperlipidemia Father   . Pancreatic cancer Paternal Grandfather   . Colon cancer Neg Hx   . Esophageal cancer Neg Hx   . Rectal cancer Neg Hx   . Stomach cancer Neg Hx     SH:  Married, non smoker  Review of Systems  Genitourinary:       Loss of urine with sneeze or cough   All other systems reviewed and are negative.   PHYSICAL EXAMINATION:    BP 120/60 (BP Location: Right Arm, Patient Position: Sitting, Cuff Size: Large)   Pulse 92   Resp 16   Ht 6' (1.829 m)   Wt 250 lb (113.4 kg)   LMP 05/10/1992   BMI 33.91 kg/m     General appearance: alert, cooperative and appears stated age Lymph:  no inguinal LAD noted  Pelvic: External genitalia:  no lesions              Urethra:  normal appearing urethra with no masses, tenderness or lesions              Bartholins and Skenes: normal                 Vagina: normal appearing vagina with normal color and discharge, no lesions, Estring removed and replaced              Cervix: no lesions              Bimanual Exam:  Uterus:  normal size, contour, position, consistency, mobility, non-tender              Adnexa: no mass, fullness, tenderness   Chaperone was present for exam.  Assessment: Vaginal atrophic changes, using Estring  Plan: Estring was removed and replaced today.  She will return in 3 months.  Does not need RF at this time.

## 2018-05-01 ENCOUNTER — Encounter: Payer: Self-pay | Admitting: Obstetrics & Gynecology

## 2018-05-05 ENCOUNTER — Other Ambulatory Visit: Payer: Self-pay | Admitting: Family Medicine

## 2018-05-05 DIAGNOSIS — Z794 Long term (current) use of insulin: Principal | ICD-10-CM

## 2018-05-05 DIAGNOSIS — E119 Type 2 diabetes mellitus without complications: Secondary | ICD-10-CM

## 2018-05-12 ENCOUNTER — Ambulatory Visit: Payer: Self-pay | Admitting: Pulmonary Disease

## 2018-05-12 ENCOUNTER — Other Ambulatory Visit: Payer: Self-pay | Admitting: Gastroenterology

## 2018-05-16 ENCOUNTER — Ambulatory Visit: Payer: Self-pay | Admitting: Pulmonary Disease

## 2018-05-16 ENCOUNTER — Ambulatory Visit: Payer: Self-pay | Admitting: Primary Care

## 2018-05-17 DIAGNOSIS — Z794 Long term (current) use of insulin: Principal | ICD-10-CM

## 2018-05-17 DIAGNOSIS — E119 Type 2 diabetes mellitus without complications: Secondary | ICD-10-CM

## 2018-05-17 MED ORDER — GLUCOSE BLOOD VI STRP: ORAL_STRIP | 3 refills | Status: DC

## 2018-05-17 NOTE — Telephone Encounter (Signed)
[name] "[name]" (Patient) [name] "[name]" (Patient) General - Inquiry  Summary: bs check  Reason for CRM: pt called to report she is checking her blood sugar one time a day, in the morning.

## 2018-05-23 ENCOUNTER — Ambulatory Visit (INDEPENDENT_AMBULATORY_CARE_PROVIDER_SITE_OTHER): Payer: Medicare Other | Admitting: Psychology

## 2018-05-23 DIAGNOSIS — F321 Major depressive disorder, single episode, moderate: Secondary | ICD-10-CM

## 2018-06-05 ENCOUNTER — Other Ambulatory Visit: Payer: Self-pay | Admitting: Gastroenterology

## 2018-06-05 ENCOUNTER — Telehealth: Payer: Self-pay | Admitting: Gastroenterology

## 2018-06-05 ENCOUNTER — Other Ambulatory Visit: Payer: Self-pay

## 2018-06-05 DIAGNOSIS — R197 Diarrhea, unspecified: Secondary | ICD-10-CM

## 2018-06-05 MED ORDER — SUCRALFATE 1 GM/10ML PO SUSP: 1.0000 g | Freq: Three times a day (TID) | ORAL | 0 refills | Status: DC

## 2018-06-05 NOTE — Telephone Encounter (Signed)
Patient is advised.  

## 2018-06-05 NOTE — Telephone Encounter (Signed)
Please check GI pathogen panel. Carafate 1gm four times daily as needed. Thanks

## 2018-06-05 NOTE — Telephone Encounter (Signed)
Developed "terrible reflux and gas from both ends" on Friday night. She had chicken and wonton soup which she says she has eaten many times before without problems. Saturday was uneventful. Sunday she had a return of these symptoms. Today she has developed loose stools in addition to the reflux and gas. She is afraid to eat but she is hungry. She is only drinking water because "everything goes straight through me. Reflux is terrible." She has not taken anything for her symptoms. I have suggested she take her dicyclomine for comfort. Please advise.

## 2018-06-05 NOTE — Telephone Encounter (Signed)
Patient states she is having a gastritis attack and would like advice on what to do.

## 2018-06-06 ENCOUNTER — Other Ambulatory Visit: Payer: Medicare Other

## 2018-06-06 ENCOUNTER — Ambulatory Visit: Payer: Medicare Other | Admitting: Psychology

## 2018-06-06 ENCOUNTER — Telehealth: Payer: Self-pay | Admitting: Gastroenterology

## 2018-06-06 DIAGNOSIS — R197 Diarrhea, unspecified: Secondary | ICD-10-CM

## 2018-06-06 NOTE — Telephone Encounter (Signed)
Pt is concerned about fluid levels and requests a CB.

## 2018-06-07 ENCOUNTER — Other Ambulatory Visit: Payer: Self-pay

## 2018-06-07 MED ORDER — ONDANSETRON HCL 4 MG PO TABS: 4.0000 mg | ORAL_TABLET | Freq: Two times a day (BID) | ORAL | 0 refills | Status: DC | PRN

## 2018-06-07 NOTE — Telephone Encounter (Signed)
Ok to send Rx for Zofran 4mg  BID as needed. Please advise her to drink adequate fluids to maintain hydration. If her symptoms are worse and she is unable to tolerate PO, will need to come to ER. Thanks

## 2018-06-07 NOTE — Progress Notes (Signed)
Discussed hydration tips with the patient. Rx to [facility].

## 2018-06-07 NOTE — Telephone Encounter (Signed)
She has submitted the stool specimen. Today she took Imodium because she feels like she is "losing too much fluid through my rectum" referring to the diarrhea. Drinks water and eats saltine crackers. Very nauseated today. No longer having upper GI symptoms. Did not pick up the Carafate.  No results on the GI Path panel. Too early. She asks for nausea medication. She is taking Dicyclomine. Afebrile. No bloody stools. No vomiting.

## 2018-06-07 NOTE — Telephone Encounter (Signed)
PT call back to advised that she is not doing any better and wants advice on if she should just go to urgent care or er... she would like a call back thanks.

## 2018-06-09 ENCOUNTER — Inpatient Hospital Stay (HOSPITAL_COMMUNITY): Payer: Medicare Other

## 2018-06-09 ENCOUNTER — Encounter: Payer: Self-pay | Admitting: Family Medicine

## 2018-06-09 ENCOUNTER — Inpatient Hospital Stay (HOSPITAL_COMMUNITY)
Admission: EM | Admit: 2018-06-09 | Discharge: 2018-06-12 | DRG: 684 | Disposition: A | Discharge status: Home / Self Care | Payer: Medicare Other | Attending: Internal Medicine | Admitting: Internal Medicine

## 2018-06-09 ENCOUNTER — Ambulatory Visit (INDEPENDENT_AMBULATORY_CARE_PROVIDER_SITE_OTHER): Payer: Medicare Other | Admitting: Family Medicine

## 2018-06-09 ENCOUNTER — Encounter (HOSPITAL_COMMUNITY): Payer: Self-pay | Admitting: Family Medicine

## 2018-06-09 ENCOUNTER — Other Ambulatory Visit: Payer: Self-pay

## 2018-06-09 VITALS — BP 88/48 | HR 76 | Temp 97.6°F

## 2018-06-09 DIAGNOSIS — J479 Bronchiectasis, uncomplicated: Secondary | ICD-10-CM | POA: Diagnosis not present

## 2018-06-09 DIAGNOSIS — E860 Dehydration: Secondary | ICD-10-CM | POA: Diagnosis not present

## 2018-06-09 DIAGNOSIS — D72829 Elevated white blood cell count, unspecified: Secondary | ICD-10-CM | POA: Diagnosis present

## 2018-06-09 DIAGNOSIS — Z96653 Presence of artificial knee joint, bilateral: Secondary | ICD-10-CM | POA: Diagnosis present

## 2018-06-09 DIAGNOSIS — Z888 Allergy status to other drugs, medicaments and biological substances status: Secondary | ICD-10-CM | POA: Diagnosis not present

## 2018-06-09 DIAGNOSIS — Z7951 Long term (current) use of inhaled steroids: Secondary | ICD-10-CM

## 2018-06-09 DIAGNOSIS — I152 Hypertension secondary to endocrine disorders: Secondary | ICD-10-CM | POA: Diagnosis present

## 2018-06-09 DIAGNOSIS — Z825 Family history of asthma and other chronic lower respiratory diseases: Secondary | ICD-10-CM

## 2018-06-09 DIAGNOSIS — Z853 Personal history of malignant neoplasm of breast: Secondary | ICD-10-CM

## 2018-06-09 DIAGNOSIS — F5105 Insomnia due to other mental disorder: Secondary | ICD-10-CM | POA: Diagnosis present

## 2018-06-09 DIAGNOSIS — I10 Essential (primary) hypertension: Secondary | ICD-10-CM | POA: Diagnosis not present

## 2018-06-09 DIAGNOSIS — F418 Other specified anxiety disorders: Secondary | ICD-10-CM | POA: Diagnosis present

## 2018-06-09 DIAGNOSIS — E119 Type 2 diabetes mellitus without complications: Secondary | ICD-10-CM | POA: Diagnosis not present

## 2018-06-09 DIAGNOSIS — K580 Irritable bowel syndrome with diarrhea: Secondary | ICD-10-CM | POA: Diagnosis present

## 2018-06-09 DIAGNOSIS — Z881 Allergy status to other antibiotic agents status: Secondary | ICD-10-CM | POA: Diagnosis not present

## 2018-06-09 DIAGNOSIS — R197 Diarrhea, unspecified: Secondary | ICD-10-CM | POA: Diagnosis not present

## 2018-06-09 DIAGNOSIS — F3341 Major depressive disorder, recurrent, in partial remission: Secondary | ICD-10-CM | POA: Diagnosis present

## 2018-06-09 DIAGNOSIS — Z8261 Family history of arthritis: Secondary | ICD-10-CM

## 2018-06-09 DIAGNOSIS — I9589 Other hypotension: Secondary | ICD-10-CM

## 2018-06-09 DIAGNOSIS — G4700 Insomnia, unspecified: Secondary | ICD-10-CM | POA: Diagnosis present

## 2018-06-09 DIAGNOSIS — Z885 Allergy status to narcotic agent status: Secondary | ICD-10-CM | POA: Diagnosis not present

## 2018-06-09 DIAGNOSIS — H409 Unspecified glaucoma: Secondary | ICD-10-CM | POA: Diagnosis present

## 2018-06-09 DIAGNOSIS — Z800 Family history of malignant neoplasm of digestive organs: Secondary | ICD-10-CM

## 2018-06-09 DIAGNOSIS — Z7984 Long term (current) use of oral hypoglycemic drugs: Secondary | ICD-10-CM

## 2018-06-09 DIAGNOSIS — Z8349 Family history of other endocrine, nutritional and metabolic diseases: Secondary | ICD-10-CM

## 2018-06-09 DIAGNOSIS — M1990 Unspecified osteoarthritis, unspecified site: Secondary | ICD-10-CM | POA: Diagnosis present

## 2018-06-09 DIAGNOSIS — J45909 Unspecified asthma, uncomplicated: Secondary | ICD-10-CM | POA: Diagnosis present

## 2018-06-09 DIAGNOSIS — E861 Hypovolemia: Secondary | ICD-10-CM | POA: Diagnosis not present

## 2018-06-09 DIAGNOSIS — E1165 Type 2 diabetes mellitus with hyperglycemia: Secondary | ICD-10-CM

## 2018-06-09 DIAGNOSIS — N179 Acute kidney failure, unspecified: Secondary | ICD-10-CM | POA: Diagnosis not present

## 2018-06-09 DIAGNOSIS — F3342 Major depressive disorder, recurrent, in full remission: Secondary | ICD-10-CM | POA: Diagnosis present

## 2018-06-09 DIAGNOSIS — Z9013 Acquired absence of bilateral breasts and nipples: Secondary | ICD-10-CM

## 2018-06-09 DIAGNOSIS — Z8249 Family history of ischemic heart disease and other diseases of the circulatory system: Secondary | ICD-10-CM

## 2018-06-09 DIAGNOSIS — Z9221 Personal history of antineoplastic chemotherapy: Secondary | ICD-10-CM

## 2018-06-09 DIAGNOSIS — Z9049 Acquired absence of other specified parts of digestive tract: Secondary | ICD-10-CM | POA: Diagnosis not present

## 2018-06-09 DIAGNOSIS — E876 Hypokalemia: Secondary | ICD-10-CM | POA: Diagnosis not present

## 2018-06-09 DIAGNOSIS — K219 Gastro-esophageal reflux disease without esophagitis: Secondary | ICD-10-CM | POA: Diagnosis present

## 2018-06-09 DIAGNOSIS — E1129 Type 2 diabetes mellitus with other diabetic kidney complication: Secondary | ICD-10-CM

## 2018-06-09 DIAGNOSIS — Z79899 Other long term (current) drug therapy: Secondary | ICD-10-CM

## 2018-06-09 LAB — CBC WITH DIFFERENTIAL/PLATELET
Abs Immature Granulocytes: 0.17 10*3/uL — ABNORMAL HIGH (ref 0.00–0.07)
Basophils Absolute: 0 10*3/uL (ref 0.0–0.1)
Basophils Relative: 0 %
EOS PCT: 7 %
Eosinophils Absolute: 0.9 10*3/uL — ABNORMAL HIGH (ref 0.0–0.5)
HCT: 38.8 % (ref 36.0–46.0)
Hemoglobin: 12.7 g/dL (ref 12.0–15.0)
Immature Granulocytes: 1 %
Lymphocytes Relative: 19 %
Lymphs Abs: 2.4 10*3/uL (ref 0.7–4.0)
MCH: 30.6 pg (ref 26.0–34.0)
MCHC: 32.7 g/dL (ref 30.0–36.0)
MCV: 93.5 fL (ref 80.0–100.0)
Monocytes Absolute: 1.1 10*3/uL — ABNORMAL HIGH (ref 0.1–1.0)
Monocytes Relative: 9 %
Neutro Abs: 8.1 10*3/uL — ABNORMAL HIGH (ref 1.7–7.7)
Neutrophils Relative %: 64 %
Platelets: 271 10*3/uL (ref 150–400)
RBC: 4.15 MIL/uL (ref 3.87–5.11)
RDW: 14.3 % (ref 11.5–15.5)
WBC: 12.7 10*3/uL — AB (ref 4.0–10.5)
nRBC: 0 % (ref 0.0–0.2)

## 2018-06-09 LAB — COMPREHENSIVE METABOLIC PANEL
ALBUMIN: 2.9 g/dL — AB (ref 3.5–5.0)
ALT: 21 U/L (ref 0–44)
AST: 19 U/L (ref 15–41)
Alkaline Phosphatase: 79 U/L (ref 38–126)
Anion gap: 12 (ref 5–15)
BUN: 36 mg/dL — ABNORMAL HIGH (ref 8–23)
CO2: 20 mmol/L — ABNORMAL LOW (ref 22–32)
Calcium: 9 mg/dL (ref 8.9–10.3)
Chloride: 104 mmol/L (ref 98–111)
Creatinine, Ser: 4.22 mg/dL — ABNORMAL HIGH (ref 0.44–1.00)
GFR calc Af Amer: 11 mL/min — ABNORMAL LOW (ref >60)
GFR calc non Af Amer: 9 mL/min — ABNORMAL LOW (ref >60)
Glucose, Bld: 151 mg/dL — ABNORMAL HIGH (ref 70–99)
Potassium: 3.3 mmol/L — ABNORMAL LOW (ref 3.5–5.1)
Sodium: 136 mmol/L (ref 135–145)
TOTAL PROTEIN: 5.8 g/dL — AB (ref 6.5–8.1)
Total Bilirubin: 0.4 mg/dL (ref 0.3–1.2)

## 2018-06-09 LAB — GASTROINTESTINAL PATHOGEN PANEL PCR
C. difficile Tox A/B, PCR: NOT DETECTED
Campylobacter, PCR: NOT DETECTED
Cryptosporidium, PCR: NOT DETECTED
E coli (ETEC) LT/ST PCR: NOT DETECTED
E coli (STEC) stx1/stx2, PCR: NOT DETECTED
E coli 0157, PCR: NOT DETECTED
Giardia lamblia, PCR: NOT DETECTED
Norovirus, PCR: NOT DETECTED
ROTAVIRUS, PCR: NOT DETECTED
Salmonella, PCR: NOT DETECTED
Shigella, PCR: NOT DETECTED

## 2018-06-09 LAB — NA AND K (SODIUM & POTASSIUM), RAND UR
Potassium Urine: 15 mmol/L
Sodium, Ur: 16 mmol/L

## 2018-06-09 LAB — URINALYSIS, ROUTINE W REFLEX MICROSCOPIC
Bilirubin Urine: NEGATIVE
Glucose, UA: NEGATIVE mg/dL
Ketones, ur: NEGATIVE mg/dL
Leukocytes, UA: NEGATIVE
Nitrite: NEGATIVE
Protein, ur: 30 mg/dL — AB
SPECIFIC GRAVITY, URINE: 1.011 (ref 1.005–1.030)
pH: 5 (ref 5.0–8.0)

## 2018-06-09 LAB — PROTEIN, URINE, RANDOM: TOTAL PROTEIN, URINE: 63 mg/dL

## 2018-06-09 LAB — LIPASE, BLOOD: Lipase: 25 U/L (ref 11–51)

## 2018-06-09 LAB — MAGNESIUM: MAGNESIUM: 1.8 mg/dL (ref 1.7–2.4)

## 2018-06-09 LAB — CREATININE, URINE, RANDOM: Creatinine, Urine: 249.23 mg/dL

## 2018-06-09 LAB — GLUCOSE, CAPILLARY: Glucose-Capillary: 127 mg/dL — ABNORMAL HIGH (ref 70–99)

## 2018-06-09 MED ORDER — ACETAMINOPHEN 650 MG RE SUPP: 650.0000 mg | Freq: Four times a day (QID) | RECTAL | Status: DC | PRN

## 2018-06-09 MED ORDER — ONDANSETRON HCL 4 MG PO TABS: 4.0000 mg | ORAL_TABLET | Freq: Four times a day (QID) | ORAL | Status: DC | PRN

## 2018-06-09 MED ORDER — DICYCLOMINE HCL 10 MG PO CAPS
20.0000 mg | ORAL_CAPSULE | Freq: Four times a day (QID) | ORAL | Status: DC
  Administered 2018-06-10 – 2018-06-12 (×8): 20 mg via ORAL
  Filled 2018-06-09 (×9): qty 2

## 2018-06-09 MED ORDER — LATANOPROST 0.005 % OP SOLN
1.0000 [drp] | Freq: Every day | OPHTHALMIC | Status: DC
  Administered 2018-06-09 – 2018-06-11 (×3): 1 [drp] via OPHTHALMIC
  Filled 2018-06-09: qty 2.5

## 2018-06-09 MED ORDER — INSULIN ASPART 100 UNIT/ML ~~LOC~~ SOLN
0.0000 [IU] | Freq: Every day | SUBCUTANEOUS | Status: DC
  Administered 2018-06-10: 2 [IU] via SUBCUTANEOUS

## 2018-06-09 MED ORDER — POTASSIUM CHLORIDE IN NACL 20-0.9 MEQ/L-% IV SOLN
INTRAVENOUS | Status: DC
  Administered 2018-06-09: 23:00:00 via INTRAVENOUS
  Filled 2018-06-09: qty 1000

## 2018-06-09 MED ORDER — LACTATED RINGERS IV BOLUS
500.0000 mL | Freq: Once | INTRAVENOUS | Status: AC
  Administered 2018-06-09: 500 mL via INTRAVENOUS

## 2018-06-09 MED ORDER — FLUOXETINE HCL 20 MG PO CAPS
40.0000 mg | ORAL_CAPSULE | Freq: Every day | ORAL | Status: DC
  Administered 2018-06-10 – 2018-06-12 (×3): 40 mg via ORAL
  Filled 2018-06-09 (×4): qty 2

## 2018-06-09 MED ORDER — HEPARIN SODIUM (PORCINE) 5000 UNIT/ML IJ SOLN
5000.0000 [IU] | Freq: Three times a day (TID) | INTRAMUSCULAR | Status: DC
  Administered 2018-06-09 – 2018-06-12 (×7): 5000 [IU] via SUBCUTANEOUS
  Filled 2018-06-09 (×8): qty 1

## 2018-06-09 MED ORDER — TEMAZEPAM 15 MG PO CAPS
30.0000 mg | ORAL_CAPSULE | Freq: Every evening | ORAL | Status: DC | PRN
  Administered 2018-06-09 – 2018-06-10 (×2): 30 mg via ORAL
  Filled 2018-06-09 (×3): qty 2

## 2018-06-09 MED ORDER — LOPERAMIDE HCL 2 MG PO CAPS
2.0000 mg | ORAL_CAPSULE | ORAL | Status: DC | PRN
  Filled 2018-06-09: qty 1

## 2018-06-09 MED ORDER — LEVALBUTEROL HCL 0.63 MG/3ML IN NEBU: 0.6300 mg | INHALATION_SOLUTION | Freq: Four times a day (QID) | RESPIRATORY_TRACT | Status: DC | PRN

## 2018-06-09 MED ORDER — PRAMIPEXOLE DIHYDROCHLORIDE 0.125 MG PO TABS
0.1250 mg | ORAL_TABLET | Freq: Every day | ORAL | Status: DC
  Administered 2018-06-09 – 2018-06-11 (×3): 0.125 mg via ORAL
  Filled 2018-06-09 (×3): qty 1

## 2018-06-09 MED ORDER — ACETAMINOPHEN 325 MG PO TABS: 650.0000 mg | ORAL_TABLET | Freq: Four times a day (QID) | ORAL | Status: DC | PRN

## 2018-06-09 MED ORDER — LACTATED RINGERS IV SOLN: INTRAVENOUS | Status: DC

## 2018-06-09 MED ORDER — SODIUM CHLORIDE 0.9 % IV BOLUS
500.0000 mL | Freq: Once | INTRAVENOUS | Status: AC
  Administered 2018-06-09: 500 mL via INTRAVENOUS

## 2018-06-09 MED ORDER — LACTATED RINGERS IV BOLUS
1000.0000 mL | Freq: Once | INTRAVENOUS | Status: AC
  Administered 2018-06-09: 1000 mL via INTRAVENOUS

## 2018-06-09 MED ORDER — DORZOLAMIDE HCL-TIMOLOL MAL 2-0.5 % OP SOLN
1.0000 [drp] | Freq: Two times a day (BID) | OPHTHALMIC | Status: DC
  Administered 2018-06-10 – 2018-06-12 (×5): 1 [drp] via OPHTHALMIC
  Filled 2018-06-09: qty 10

## 2018-06-09 MED ORDER — INSULIN ASPART 100 UNIT/ML ~~LOC~~ SOLN
0.0000 [IU] | Freq: Three times a day (TID) | SUBCUTANEOUS | Status: DC
  Administered 2018-06-10: 2 [IU] via SUBCUTANEOUS
  Administered 2018-06-10 (×2): 1 [IU] via SUBCUTANEOUS
  Administered 2018-06-11 – 2018-06-12 (×3): 2 [IU] via SUBCUTANEOUS

## 2018-06-09 MED ORDER — ALPRAZOLAM 0.25 MG PO TABS: 0.2500 mg | ORAL_TABLET | Freq: Every day | ORAL | Status: DC | PRN

## 2018-06-09 MED ORDER — MAGNESIUM SULFATE IN D5W 1-5 GM/100ML-% IV SOLN
1.0000 g | Freq: Once | INTRAVENOUS | Status: AC
  Administered 2018-06-09: 1 g via INTRAVENOUS
  Filled 2018-06-09: qty 100

## 2018-06-09 MED ORDER — POTASSIUM CHLORIDE CRYS ER 20 MEQ PO TBCR
20.0000 meq | EXTENDED_RELEASE_TABLET | Freq: Once | ORAL | Status: AC
  Administered 2018-06-09: 20 meq via ORAL
  Filled 2018-06-09: qty 1

## 2018-06-09 MED ORDER — SODIUM CHLORIDE 0.9% FLUSH
3.0000 mL | Freq: Two times a day (BID) | INTRAVENOUS | Status: DC
  Administered 2018-06-09 – 2018-06-12 (×5): 3 mL via INTRAVENOUS

## 2018-06-09 MED ORDER — ONDANSETRON HCL 4 MG/2ML IJ SOLN: 4.0000 mg | Freq: Four times a day (QID) | INTRAMUSCULAR | Status: DC | PRN

## 2018-06-09 MED ORDER — MOMETASONE FURO-FORMOTEROL FUM 200-5 MCG/ACT IN AERO
2.0000 | INHALATION_SPRAY | Freq: Two times a day (BID) | RESPIRATORY_TRACT | Status: DC
  Administered 2018-06-10 – 2018-06-11 (×2): 2 via RESPIRATORY_TRACT
  Filled 2018-06-09: qty 8.8

## 2018-06-09 NOTE — ED Provider Notes (Signed)
[facility] EMERGENCY DEPARTMENT Provider Note   CSN: [id number] Arrival date & time: 06/09/18  1714     History   Chief Complaint No chief complaint on file.   HPI [name] is a 78 y.o. female.  HPI  [name] is a 78 y.o. female with PMH of allergy, anemia, anxiety and arthritis, asthma, bronchiectasis, history of breast cancer status post bilateral mastectomy and chemotherapy in 1974, history of lumbar compression fracture, and depression, diabetes, diverticulitis, eczema, endometriosis, gastritis, glaucoma, headaches, hypertension, severe esophageal dysplasia, shingles, thyroid disease who presents from her primary care doctor's office with concern for hypotension.  Patient reports having had about 4-5 episodes of watery diarrhea since Sunday.  She reports at that time she had a mild episode of reflux and nonbloody nonbilious emesis x1.  No nausea or vomiting since.  No abdominal pain but does feel slightly distended.  No cramping.  Passing flatus.  No melena or hematochezia.  No mucus in her stools.  No recent antibiotic use or history of C. difficile.  No recent sick contacts.  No fevers or chills.  Gets intermittently lightheaded when standing.  Has had minimal p.o. solid intake but drinking fluids.  Went to her PCPs office earlier today where she was found to have low blood pressure and counseled to come to the ED.  She feels largely normal at this time and is not in discomfort.  No urinary symptoms.  Past Medical History:  Diagnosis Date  . Allergy   . Anemia   . Anxiety   . Arthritis    right knee;injections every 46months   . Asthma   . Bronchiectasis ([place])   . Cancer (HCC)    HX BREAST CANCER  . COMPRESSION FRACTURE, LUMBAR VERTEBRAE 08/21/2008   Qualifier: Diagnosis of  By: Arnoldo Morale MD, Balinda Quails   . Depression   . Diabetes mellitus    takes Amaryl and Januvia daily  . Difficulty sleeping   . Diverticulitis   . Early cataracts, bilateral    . Eczema   . Endometriosis   . Gastritis   . Glaucoma   . Hammer toe   . Headache(784.0)    Migraines  . Hypertension    takes Atenolol daily  . IBS (irritable bowel syndrome)   . Joint pain   . Kidney stone   . Neuropathy   . Open wound of second toe of left foot    at pre-op appt, wound appears clear of infection 1 month post removal of toenail, no obvious exudate present nor any redness,  . Osteomyelitis ([place])    left 2nd toe  . Osteopenia   . PONV (postoperative nausea and vomiting)   . Posterior tibial tendon dysfunction    left foot  . Scoliosis   . Severe esophageal dysplasia   . 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 Thyroid disease   . Vitamin D deficiency    takes Vit d daily    Patient Active Problem List   Diagnosis Date Noted  . AKI (acute kidney injury) ([place]) 06/09/2018  . Hypokalemia 06/09/2018  . Hypotension due to hypovolemia 06/09/2018  . History of total knee replacement, bilateral 11/14/2017  . History of adenomatous polyp of colon 05/18/2017  . Diarrhea   . Polyp of ascending colon   . Arthritis of midfoot 03/03/2016  . Hammertoes of both feet 03/03/2016  . Aortic atherosclerosis ([place]) 02/16/2016  . Solitary pulmonary  nodule 01/08/2016  . 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  . Bronchiectasis ([place]) 10/18/2015  . OA (osteoarthritis) of knee 12/30/2014  . IBS (irritable bowel syndrome) 07/17/2014  . Depression with anxiety 01/03/2014  . Posterior tibial tendon dysfunction 01/03/2014  . Osteoarthritis, knee 01/03/2014  . Glaucoma 01/03/2014  . Essential hypertension, benign 01/03/2014  . Obesity (BMI 30-39.9) 06/15/2013  . Primary open-angle glaucoma 08/02/2012  . Foot tendinitis 07/20/2010  . INSOMNIA, CHRONIC 07/25/2009  . Fatty liver 03/18/2009  . GERD 12/25/2007  . Hyperlipidemia 07/26/2007  . ANEMIA, B12 DEFICIENCY 12/29/2006  . Diabetes mellitus type II, controlled ([place]) 12/28/2006  . RESTLESS LEG  SYNDROME, MILD 12/28/2006  . NEUROPATHY, IDIOPATHIC PERIPHERAL NEC 12/28/2006  . OSTEOPOROSIS 12/12/2006  . BREAST CANCER, HX OF 12/12/2006    Past Surgical History:  Procedure Laterality Date  . ADENOIDECTOMY     at age 30  . biopsy on back     benign  . CATARACT EXTRACTION    . CHOLECYSTECTOMY  06/2008  . COLONOSCOPY  11/02/05  . COLONOSCOPY WITH PROPOFOL N/A 05/17/2017   Procedure: COLONOSCOPY WITH PROPOFOL;  Surgeon: Mauri Pole, MD;  Location: [facility];  Service: Endoscopy;  Laterality: N/A;  PT WILL BE ADMITTED THE DAY BEFORE FOR PREP PER ROBIN KB  . DILATION AND CURETTAGE OF UTERUS    . ENDOMETRIAL BIOPSY  06/22/2011   benign polyp, no hyperplasia  . excision on breast     internal infected suture from breast surgery  . excision removed from neck     infected lymph node  . FOOT SURGERY     left foot-cleaning out areas on toes at the wound center-having MRI to determine if osteomyelitis  . HYSTEROSCOPY  06/22/11   PMB submucosal myoma  . JOINT REPLACEMENT     left total shoulder  . LAPAROSCOPIC OOPHORECTOMY Right 12/2004   absent LSO  . LAPAROTOMY    . MASTECTOMY Bilateral 1994 right, 1995 left  . SHOULDER HEMI-ARTHROPLASTY  01/01/2012   Procedure: SHOULDER HEMI-ARTHROPLASTY;  Surgeon: Roseanne Kaufman, MD;  Location: [facility];  Service: Orthopedics;  Laterality: Left;  Left Shoulder Hemi Arthroplasty with Repair and Reconstruction as Necessary   . THYROIDECTOMY     80yrs ago. follows endocrine  . TONSILLECTOMY    . TOTAL KNEE ARTHROPLASTY Right 12/30/2014   Procedure: RIGHT TOTAL KNEE ARTHROPLASTY;  Surgeon: Gaynelle Arabian, MD;  Location: [facility];  Service: Orthopedics;  Laterality: Right;  . TOTAL KNEE ARTHROPLASTY Left 11/29/2016   Procedure: LEFT TOTAL KNEE ARTHROPLASTY;  Surgeon: Gaynelle Arabian, MD;  Location: [facility];  Service: Orthopedics;  Laterality: Left;     OB History    Gravida  0   Para      Term      Preterm      AB      Living        SAB        TAB      Ectopic      Multiple      Live Births           Obstetric Comments  1 adopted         Home Medications    Prior to Admission medications   Medication Sig Start Date End Date Taking? Authorizing Provider  ALPRAZolam (XANAX) 0.25 MG tablet Take 1 tablet (0.25 mg total) by mouth daily as needed for anxiety. 04/28/18  Yes Megan Salon, MD  amoxicillin (AMOXIL) 500 MG capsule Take 2,000 mg by mouth See  admin instructions. Take 2,000 mg by mouth one hour prior to dental procedures, to "pre-treat" 04/17/18  Yes [provider]  atenolol (TENORMIN) 25 MG tablet TAKE 1 TABLET BY MOUTH ONCE DAILY Patient taking differently: Take 12.5 mg by mouth daily.  03/29/18  Yes Marin Olp, MD  Bacillus Coagulans-Inulin (ALIGN PREBIOTIC-PROBIOTIC PO) Take 1 capsule by mouth daily.    Yes [provider]  budesonide-formoterol (SYMBICORT) 160-4.5 MCG/ACT inhaler Inhale 2 puffs into the lungs 2 (two) times daily.   Yes [provider]  Cholecalciferol (VITAMIN D3) 2000 units capsule Take 2,000 Units by mouth daily.   Yes [provider]  Coenzyme Q10 300 MG CAPS Take 300 mg by mouth daily.   Yes [provider]  colesevelam (WELCHOL) 625 MG tablet TAKE 1 TABLET BY MOUTH THREE TIMES DAILY Patient taking differently: Take 625 mg by mouth 3 (three) times daily.  02/21/18  Yes Nandigam, Venia Minks, MD  dicyclomine (BENTYL) 10 MG capsule TAKE 2 CAPSULES BY MOUTH 4 TIMES DAILY AS NEEDED FOR  SPASMS Patient taking differently: Take 20 mg by mouth 4 (four) times daily.  06/06/18  Yes Nandigam, Venia Minks, MD  dorzolamide-timolol (COSOPT) 22.3-6.8 MG/ML ophthalmic solution Place 1 drop into both eyes 2 (two) times daily.  02/08/13  Yes [provider]  ESTRING 2 MG vaginal ring INSERT 1 RING  INTO VAGINA EVERY 3 MONTHS Patient taking differently: Place 2 mg vaginally every 3 (three) months.  01/25/18  Yes Megan Salon, MD  FLUoxetine (PROZAC)  40 MG capsule TAKE 1 CAPSULE BY MOUTH ONCE DAILY. Patient taking differently: Take 40 mg by mouth daily.  04/19/18  Yes Marin Olp, MD  glimepiride (AMARYL) 2 MG tablet Take 1 tablet (2 mg total) by mouth daily with breakfast. 12/29/17  Yes Marin Olp, MD  latanoprost (XALATAN) 0.005 % ophthalmic solution Place 1 drop into both eyes at bedtime. 05/20/18  Yes [provider]  levalbuterol (XOPENEX) 0.63 MG/3ML nebulizer solution Take 3 mLs (0.63 mg total) by nebulization every 6 (six) hours as needed for wheezing or shortness of breath. 04/11/18  Yes Lauraine Rinne, NP  ondansetron (ZOFRAN) 4 MG tablet Take 1 tablet (4 mg total) by mouth 2 (two) times daily as needed for nausea or vomiting. 06/07/18  Yes Nandigam, Venia Minks, MD  Polyvinyl Alcohol-Povidone (REFRESH OP) Place 1 drop into both eyes daily as needed (dry eyes).    Yes [provider]  pramipexole (MIRAPEX) 0.5 MG tablet TAKE 1 TABLET BY MOUTH AT BEDTIME Patient taking differently: Take 0.5 mg by mouth at bedtime.  04/26/18  Yes Marin Olp, MD  Respiratory Therapy Supplies (FLUTTER) DEVI Use as directed 01/08/16  Yes Juanito Doom, MD  temazepam (RESTORIL) 15 MG capsule Take 2 capsules (30 mg total) by mouth at bedtime as needed for sleep. Patient taking differently: Take 30 mg by mouth at bedtime.  12/29/17  Yes Marin Olp, MD  cetirizine (ZYRTEC) 10 MG tablet Take 10 mg by mouth daily.    [provider]  Flaxseed, Linseed, (FLAX SEEDS PO) Take 1,400 mg by mouth daily.    [provider]  glucose blood (FREESTYLE LITE) test strip USE TO CHECK BLOOD SUGAR EVERY MORNING E11.9 05/17/18   Marin Olp, MD  lisinopril (PRINIVIL,ZESTRIL) 10 MG tablet TAKE 1 TABLET BY MOUTH ONCE DAILY Patient taking differently: Take 10 mg by mouth at bedtime.  02/01/18   Marin Olp, MD  Methylcellulose,  Laxative, (CITRUCEL PO) Take 3 tablets by mouth at bedtime.    [provider]    Misc Natural Products (GRAPE SEED COMPLEX) CAPS Take 1 capsule by mouth daily.    [provider]  Multiple Vitamin (MULTIVITAMIN WITH MINERALS) TABS tablet Take 1 tablet by mouth daily.    [provider]  Omega-3 Fatty Acids (FISH OIL) 1200 MG CAPS Take 1,200 mg by mouth daily.    [provider]  Selenium 200 MCG CAPS Take 200 mcg by mouth daily.    [provider]  sucralfate (CARAFATE) 1 GM/10ML suspension Take 10 mLs (1 g total) by mouth 4 (four) times daily -  before meals and at bedtime. May take as needed Patient not taking: Reported on 06/09/2018 06/05/18   Mauri Pole, MD  VICTOZA 18 MG/3ML SOPN INJECT 1.8 MG INTO THE SKIN DAILY Patient taking differently: Inject 1.8 mg into the skin daily.  01/16/18   Marin Olp, MD  vitamin B-12 (CYANOCOBALAMIN) 1000 MCG tablet Take 1,000 mcg by mouth daily.    [provider]    Family History Family History  Problem Relation Age of Onset  . Arthritis Mother   . COPD Father   . Heart disease Father        MI 46 - states he died of lockjaw  . Hypertension Father   . Hyperlipidemia Father   . Pancreatic cancer Paternal Grandfather   . Colon cancer Neg Hx   . Esophageal cancer Neg Hx   . Rectal cancer Neg Hx   . Stomach cancer Neg Hx     Social History Social History   Tobacco Use  . Smoking status: Never Smoker  . Smokeless tobacco: Never Used  Substance Use Topics  . Alcohol use: No  . Drug use: No     Allergies   Brimonidine; Cephalexin; Erythromycin ethylsuccinate; Levaquin [levofloxacin in d5w]; Nitrofurantoin; and Percocet [oxycodone-acetaminophen]   Review of Systems Review of Systems  Constitutional: Negative for chills and fever.  HENT: Negative for ear pain and sore throat.   Eyes: Negative for pain and visual disturbance.  Respiratory: Negative for cough and shortness of breath.   Cardiovascular: Negative for chest pain and palpitations.   Gastrointestinal: Positive for abdominal distention, diarrhea, nausea and vomiting. Negative for abdominal pain, anal bleeding, blood in stool and constipation.  Genitourinary: Negative for dysuria and hematuria.  Musculoskeletal: Negative for arthralgias and back pain.  Skin: Negative for color change and rash.  Neurological: Negative for dizziness, seizures, syncope, facial asymmetry, weakness and light-headedness.  All other systems reviewed and are negative.    Physical Exam Updated Vital Signs BP (!) 115/56 (BP Location: Right Arm)   Pulse 88   Temp 98.6 F (37 C) (Oral)   Resp 18   Ht 6' (1.829 m)   Wt 110.2 kg   LMP 05/10/1992   SpO2 95%   BMI 32.96 kg/m   Physical Exam Vitals signs and nursing note reviewed.  Constitutional:      General: She is not in acute distress.    Appearance: Normal appearance. She is well-developed. She is not ill-appearing or diaphoretic.  HENT:     Head: Normocephalic and atraumatic.     Mouth/Throat:     Lips: Pink.     Mouth: Mucous membranes are dry.  Eyes:     Conjunctiva/sclera: Conjunctivae normal.  Neck:     Musculoskeletal: Neck supple.  Cardiovascular:     Rate and Rhythm: Normal rate  and regular rhythm.     Heart sounds: No murmur.  Pulmonary:     Effort: Pulmonary effort is normal. No respiratory distress.     Breath sounds: Normal breath sounds.  Abdominal:     General: Abdomen is flat. There is no distension.     Palpations: Abdomen is soft.     Tenderness: There is no abdominal tenderness. There is no guarding or rebound. Negative signs include Murphy's sign, Rovsing's sign, McBurney's sign, psoas sign and obturator sign.  Skin:    General: Skin is warm and dry.     Capillary Refill: Capillary refill takes 2 to 3 seconds.  Neurological:     General: No focal deficit present.     Mental Status: She is alert and oriented to person, place, and time.     GCS: GCS eye subscore is 4. GCS verbal subscore is 5. GCS motor  subscore is 6.     Cranial Nerves: Cranial nerves are intact.     Sensory: Sensation is intact.     Motor: Motor function is intact.     Coordination: Coordination is intact.  Psychiatric:        Behavior: Behavior is cooperative.      ED Treatments / Results  Labs (all labs ordered are listed, but only abnormal results are displayed) Labs Reviewed  CBC WITH DIFFERENTIAL/PLATELET - Abnormal; Notable for the following components:      Result Value   WBC 12.7 (*)    Neutro Abs 8.1 (*)    Monocytes Absolute 1.1 (*)    Eosinophils Absolute 0.9 (*)    Abs Immature Granulocytes 0.17 (*)    All other components within normal limits  COMPREHENSIVE METABOLIC PANEL - Abnormal; Notable for the following components:   Potassium 3.3 (*)    CO2 20 (*)    Glucose, Bld 151 (*)    BUN 36 (*)    Creatinine, Ser 4.22 (*)    Total Protein 5.8 (*)    Albumin 2.9 (*)    GFR calc non Af Amer 9 (*)    GFR calc Af Amer 11 (*)    All other components within normal limits  URINALYSIS, ROUTINE W REFLEX MICROSCOPIC - Abnormal; Notable for the following components:   APPearance HAZY (*)    Hgb urine dipstick SMALL (*)    Protein, ur 30 (*)    Bacteria, UA RARE (*)    All other components within normal limits  GLUCOSE, CAPILLARY - Abnormal; Notable for the following components:   Glucose-Capillary 127 (*)    All other components within normal limits  MAGNESIUM  LIPASE, BLOOD  NA AND K (SODIUM & POTASSIUM), RAND UR  PROTEIN, URINE, RANDOM  CREATININE, URINE, RANDOM  UREA NITROGEN, URINE  BASIC METABOLIC PANEL  CBC WITH DIFFERENTIAL/PLATELET    EKG EKG Interpretation  Date/Time:  Friday June 09 2018 18:21:13 EST Ventricular Rate:  73 PR Interval:    QRS Duration: 92 QT Interval:  407 QTC Calculation: 449 R Axis:   61 Text Interpretation:  Sinus rhythm Borderline prolonged PR interval Abnormal R-wave progression, early transition Since last tracing rate slower Confirmed by Orlie Dakin [phone]) on 06/09/2018 6:50:30 PM   Radiology US Renal  Result Date: 06/09/2018 CLINICAL DATA:  Acute renal failure. EXAM: RENAL / URINARY TRACT ULTRASOUND COMPLETE COMPARISON:  CT abdomen and pelvis Oct 04, 2015 FINDINGS: Right Kidney: Renal measurements: 12 x 6.3 x 6.9 cm = volume: 273 mL . Echogenicity within normal limits.  No mass or hydronephrosis visualized. Left Kidney: Renal measurements: 12.2 x 6.7 x 6.5 cm = volume: Sooner in 74 mL. Echogenicity within normal limits. No mass or hydronephrosis visualized. Bladder: Appears normal for degree of bladder distention. Bilateral ureteral jets documented. IMPRESSION: Normal renal ultrasound. Electronically Signed   By: Elon Alas M.D.   On: 06/09/2018 20:28    Procedures Procedures (including critical care time)  Medications Ordered in ED Medications  ALPRAZolam (XANAX) tablet 0.25 mg (has no administration in time range)  FLUoxetine (PROZAC) capsule 40 mg (has no administration in time range)  temazepam (RESTORIL) capsule 30 mg (30 mg Oral Given 06/09/18 2324)  dicyclomine (BENTYL) capsule 20 mg (has no administration in time range)  pramipexole (MIRAPEX) tablet 0.125 mg (0.125 mg Oral Given 06/09/18 2325)  mometasone-formoterol (DULERA) 200-5 MCG/ACT inhaler 2 puff (has no administration in time range)  levalbuterol (XOPENEX) nebulizer solution 0.63 mg (has no administration in time range)  dorzolamide-timolol (COSOPT) 22.3-6.8 MG/ML ophthalmic solution 1 drop (has no administration in time range)  latanoprost (XALATAN) 0.005 % ophthalmic solution 1 drop (1 drop Both Eyes Given 06/09/18 2320)  heparin injection 5,000 Units (5,000 Units Subcutaneous Given 06/09/18 2321)  sodium chloride flush (NS) 0.9 % injection 3 mL (3 mLs Intravenous Given 06/09/18 2324)  0.9 % NaCl with KCl 20 mEq/ L  infusion ( Intravenous New Bag/Given 06/09/18 2320)  acetaminophen (TYLENOL) tablet 650 mg (has no administration in time range)    Or   acetaminophen (TYLENOL) suppository 650 mg (has no administration in time range)  ondansetron (ZOFRAN) tablet 4 mg (has no administration in time range)    Or  ondansetron (ZOFRAN) injection 4 mg (has no administration in time range)  insulin aspart (novoLOG) injection 0-9 Units (has no administration in time range)  insulin aspart (novoLOG) injection 0-5 Units (has no administration in time range)  magnesium sulfate IVPB 1 g 100 mL (has no administration in time range)  sodium chloride 0.9 % bolus 500 mL (has no administration in time range)  loperamide (IMODIUM) capsule 2-4 mg (has no administration in time range)  lactated ringers bolus 1,000 mL (0 mLs Intravenous Stopped 06/09/18 1926)  lactated ringers bolus 500 mL (0 mLs Intravenous Stopped 06/09/18 2256)  potassium chloride SA (K-DUR,KLOR-CON) CR tablet 20 mEq (20 mEq Oral Given 06/09/18 2325)     Initial Impression / Assessment and Plan / ED Course  I have reviewed the triage vital signs and the nursing notes.  Pertinent labs & imaging results that were available during my care of the patient were reviewed by me and considered in my medical decision making (see chart for details).     MDM:  Imaging: None indicated at this time.    ED Provider Interpretation of EKG: Sinus rhythm with a rate 73 bpm, normal axis, no ST segment elevation/STD, or pathologic T wave changes. No interval irregularities.   Labs: 1.8, CMP with CO2 of 20, K of 3.3, BUN 36 and creatinine 4.2 (baseline around 0.6), UA with rare bacteria otherwise negative  On initial evaluation, patient appears pleasant and smiling. Afebrile and hemodynamically stable although mildly hypotensive with maps in the high 50s to low 60s and no tachycardia. Alert and oriented x4.  Presents with diarrhea and lightheadedness with hypotension in clinic as detailed above.  On exam, patient has evidence for dehydration with dry mucous membranes and dry skin with tenting.  Slight  decrease in cap refill.  No edema.  Lungs clear bilaterally abdominal exam benign.  Specifically no pain in the right upper quadrant and patient is status post cholecystectomy.  LFTs unremarkable and no hyperbilirubinemia.  Doubt CBD stone or other liver pathology such as hepatitis.  No pain in the right lower quadrant guarding at McBurney's point and no Rovsing sign.  No obturator or psoas sign.  Doubt appendicitis.  No pelvic pain or vaginal complaints.  No urinary symptoms.  No subjective pain at home or history of atrial fibrillation/arrhythmia.  No history of peripheral vascular disease.  Doubt mesenteric ischemia.  No recent antibiotic use or history of C. difficile.  Doubt C. difficile colitis at this time.  Do not feel the scan is indicated given her lack of pain.  No hernias noted on exam.  Patient appears volume down and suspect symptoms are likely secondary to hypovolemia in the setting of diarrheal illness which may be viral versus other cause.  GI pathogen panel collected in process by primary care physician 2 days ago.  Given 1 L of IV LR initially.  Labs resulting showing significant acute kidney injury with baseline creatinine around 0.6 and creatinine 4.2 today.  No symptoms consistent with nephrolithiasis or postobstructive AKI.  UA without convincing evidence for infection.  No urinary symptoms.  Suspect prerenal azotemia is most likely etiology.  Given additional 500 mL IV LR and started on maintenance IV LR.  Reports making the same on the urine at home prior to arrival today.  Admitted to hospitalist thereafter.  The plan for this patient was discussed with Dr. Winfred Leeds who voiced agreement and who oversaw evaluation and treatment of this patient.   The patient was fully informed and involved with the history taking, evaluation, workup including labs/images, and plan. The patient's concerns and questions were addressed to the patient's satisfaction and she expressed agreement with  the plan to admit.    Final Clinical Impressions(s) / ED Diagnoses   Final diagnoses:  Acute kidney injury ([place])  Dehydration  Diarrhea, unspecified type    ED Discharge Orders    None       Branston Halsted, Rodena Goldmann, MD 06/09/18 2337    Orlie Dakin, MD 06/10/18 [phone]

## 2018-06-09 NOTE — ED Provider Notes (Signed)
Patient's had diarrhea approximately 5 episodes per day for the past 5 days.  She had one episode of vomiting 5 days ago, none since.  She denies any fever denies abdominal pain.  She does admit to lightheadedness with standing.  She reports that her gastroenterologist has ordered stool studies which are pending.  On exam she is alert and in no distress HEENT exam mucous membranes dry neck supple lungs clear to auscultation heart regular rate and rhythm abdomen obese, nontender   Orlie Dakin, MD 06/09/18 [phone]

## 2018-06-09 NOTE — ED Triage Notes (Signed)
Pt sent over from MD office for low s b/p of 88,. Pt has had the stomach bug and having n/v/d over the past week ,

## 2018-06-09 NOTE — Progress Notes (Signed)
Phone [phone]   Subjective:  [name] is a 78 y.o. year old very pleasant female patient who presents for/with See problem oriented charting ROS- patient feels very fatigued, dry mouth. Feels worn out. Has diarrhea and abdominal cramping. No chest pain or shortnes of breat reported.   Past Medical History-  Patient Active Problem List   Diagnosis Date Noted  . 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    Priority: High  . Bronchiectasis with acute exacerbation ([place]) 10/18/2015    Priority: High  . Diabetes mellitus type II, controlled ([place]) 12/28/2006    Priority: High  . Aortic atherosclerosis ([place]) 02/16/2016    Priority: Medium  . Solitary pulmonary nodule 01/08/2016    Priority: Medium  . IBS (irritable bowel syndrome) 07/17/2014    Priority: Medium  . Depression, major, single episode, in partial remission ([place]) 01/03/2014    Priority: Medium  . Essential hypertension, benign 01/03/2014    Priority: Medium  . Fatty liver 03/18/2009    Priority: Medium  . Hyperlipidemia 07/26/2007    Priority: Medium  . ANEMIA, B12 DEFICIENCY 12/29/2006    Priority: Medium  . RESTLESS LEG SYNDROME, MILD 12/28/2006    Priority: Medium  . OSTEOPOROSIS 12/12/2006    Priority: Medium  . CKD (chronic kidney disease), stage II 01/22/2014    Priority: Low  . Posterior tibial tendon dysfunction 01/03/2014    Priority: Low  . Osteoarthritis, knee 01/03/2014    Priority: Low  . Glaucoma 01/03/2014    Priority: Low  . Obesity (BMI 30-39.9) 06/15/2013    Priority: Low  . Foot tendinitis 07/20/2010    Priority: Low  . INSOMNIA, CHRONIC 07/25/2009    Priority: Low  . GERD 12/25/2007    Priority: Low  . NEUROPATHY, IDIOPATHIC PERIPHERAL NEC 12/28/2006    Priority: Low  . BREAST CANCER, HX OF 12/12/2006    Priority: Low  . History of total knee replacement, bilateral 11/14/2017  . History of adenomatous polyp of colon 05/18/2017  . Diarrhea   . Polyp of ascending colon   . Arthritis of  midfoot 03/03/2016  . Hammertoes of both feet 03/03/2016  . OA (osteoarthritis) of knee 12/30/2014  . Primary open-angle glaucoma 08/02/2012    Medications- reviewed and updated No current facility-administered medications for this visit.    Current Outpatient Medications  Medication Sig Dispense Refill  . ALPRAZolam (XANAX) 0.25 MG tablet Take 1 tablet (0.25 mg total) by mouth daily as needed for anxiety. 30 tablet 0  . atenolol (TENORMIN) 25 MG tablet TAKE 1 TABLET BY MOUTH ONCE DAILY 90 tablet 3  . Bacillus Coagulans-Inulin (ALIGN PREBIOTIC-PROBIOTIC PO) Take 1 tablet by mouth daily.    . budesonide-formoterol (SYMBICORT) 160-4.5 MCG/ACT inhaler Inhale 2 puffs into the lungs 2 (two) times daily.    . cetirizine (ZYRTEC) 10 MG tablet Take 10 mg by mouth daily.    . Cholecalciferol (VITAMIN D3) 2000 units capsule Take 2,000 Units by mouth daily.    . Coenzyme Q10 300 MG CAPS Take 300 mg by mouth daily.    . colesevelam (WELCHOL) 625 MG tablet TAKE 1 TABLET BY MOUTH THREE TIMES DAILY 90 tablet 2  . dicyclomine (BENTYL) 10 MG capsule TAKE 2 CAPSULES BY MOUTH 4 TIMES DAILY AS NEEDED FOR  SPASMS 120 capsule 0  . dorzolamide-timolol (COSOPT) 22.3-6.8 MG/ML ophthalmic solution Place 1 drop into both eyes 2 (two) times daily.     [name] ESTRING 2 MG vaginal ring INSERT 1 RING  INTO VAGINA EVERY 3 MONTHS  1 each 3  . Flaxseed, Linseed, (FLAX SEEDS PO) Take 1,400 mg by mouth daily.    [name] FLUoxetine (PROZAC) 40 MG capsule TAKE 1 CAPSULE BY MOUTH ONCE DAILY. 90 capsule 3  . glimepiride (AMARYL) 2 MG tablet Take 1 tablet (2 mg total) by mouth daily with breakfast. 90 tablet 3  . glucose blood (FREESTYLE LITE) test strip USE TO CHECK BLOOD SUGAR EVERY MORNING E11.9 100 each 3  . levalbuterol (XOPENEX) 0.63 MG/3ML nebulizer solution Take 3 mLs (0.63 mg total) by nebulization every 6 (six) hours as needed for wheezing or shortness of breath. 360 mL 2  . lisinopril (PRINIVIL,ZESTRIL) 10 MG tablet TAKE 1 TABLET  BY MOUTH ONCE DAILY 90 tablet 3  . LUMIGAN 0.01 % SOLN Place 1 drop into both eyes at bedtime.     . Methylcellulose, Laxative, (CITRUCEL PO) Take 3 tablets by mouth at bedtime.    . Misc Natural Products (GRAPE SEED COMPLEX) CAPS Take 1 capsule by mouth daily.    . Multiple Vitamin (MULTIVITAMIN WITH MINERALS) TABS tablet Take 1 tablet by mouth daily.    . Omega-3 Fatty Acids (FISH OIL) 1200 MG CAPS Take 1,200 mg by mouth daily.    . ondansetron (ZOFRAN) 4 MG tablet Take 1 tablet (4 mg total) by mouth 2 (two) times daily as needed for nausea or vomiting. 30 tablet 0  . Polyvinyl Alcohol-Povidone (REFRESH OP) Apply 1 drop to eye daily as needed (dry eyes).    . pramipexole (MIRAPEX) 0.5 MG tablet TAKE 1 TABLET BY MOUTH AT BEDTIME 90 tablet 0  . Respiratory Therapy Supplies (FLUTTER) DEVI Use as directed 1 each 0  . Selenium 200 MCG CAPS Take 200 mcg by mouth daily.    . sucralfate (CARAFATE) 1 GM/10ML suspension Take 10 mLs (1 g total) by mouth 4 (four) times daily -  before meals and at bedtime. May take as needed 420 mL 0  . temazepam (RESTORIL) 15 MG capsule Take 2 capsules (30 mg total) by mouth at bedtime as needed for sleep. 60 capsule 5  . VICTOZA 18 MG/3ML SOPN INJECT 1.8 MG INTO THE SKIN DAILY 9 pen 6  . vitamin B-12 (CYANOCOBALAMIN) 1000 MCG tablet Take 1,000 mcg by mouth daily.     Facility-Administered Medications Ordered in Other Visits  Medication Dose Route Frequency Provider Last Rate Last Dose  . lactated ringers bolus 1,000 mL  1,000 mL Intravenous Once Scheidler, Irven Easterly II, MD         Objective:  BP (!) 88/48 (BP Location: Right Arm, Patient Position: Sitting, Cuff Size: Large)   Pulse 76   Temp 97.6 F (36.4 C) (Oral)   LMP 05/10/1992  Gen: NAD, resting comfortably Very dry mucous membranes CV: RRR no murmurs rubs or gallops Lungs: CTAB no crackles, wheeze, rhonchi Abdomen: soft/nontender/nondistended Ext: no edema Skin: warm, dry, no rash    Assessment and  Plan   Diarrhea leading to hypotension in setting of IBS, diabetes, hypertension S: Patient complains of diarrhea for the last 4 days. Sunday evening had a feeling like she had indigestion- felt like acidity into throat- ended up throwing up. Since then has had diarrhea. Has been drinking a lot of water and some tea and some Dr. Malachi Bonds. Feels like can't focus as well. Also notes tension in shoulder and neck.   No sick contacts  She has had long-term issues with her bowels-currently on align prebiotic-probiotic.  She is on WelChol for diarrhea.  She is on  Bentyl for muscle spasms.  GI sent in Carafate- she didn't pick it up due to cost  On 06/06/2018 sent stool sample to GI for GI pathogen panel- that is currently pending.  A/P: Patient with baseline IBS but with recently worsening symptoms of diarrhea appears acutely ill likely due to dehydration with associated hypotension on initial check-possibly related dehydration.  Original plan was to hold lisinopril, cut atenolol in half, hold Victoza, push fluids including Gatorade and give a bolus of IV fluids then recheck blood pressure- our nursing team attempted to establish IV but patient pulled away on 1 encounter and on the second attempt unable to establish flow-at this point patient stated she wanted to go to the hospital to receive IV fluids and she was directed to go immediately to the hospital. - My team was instructed to update the after visit summary and instruct patient to go immediately to the hospital-appears this was printed with original pre-IV fluid bolus failure instructions -Had intended for team to update blood pressure before patient left but I do not see a repeat value -Patient preferred private transport over emergency transport-she states she feels well enough despite low blood pressure -Had also planned labs but since patient was going straight to the emergency room we opted out of this  Future Appointments  Date Time Provider  [place]  06/20/2018  3:00 PM [place] Sherrilyn Rist, [place] [facility] None  06/30/2018  4:00 PM Marin Olp, MD [facility]  07/04/2018  4:00 PM She[place] Sherrilyn Rist, [place] [facility] None  07/28/2018 12:45 PM Megan Salon, MD [place] None  01/25/2019  3:00 PM [facility] [place]   Return precautions advised.  [name], MD

## 2018-06-09 NOTE — ED Notes (Signed)
Patient transported to Ultrasound 

## 2018-06-09 NOTE — Assessment & Plan Note (Signed)
We reduced amaryl and she went back up to 4mg . Blood sugar up to 269 yesterday. Today it was 169.  She is still on Victoza Lab Results  Component Value Date   HGBA1C 5.6 12/29/2017  With GI illness would make sense to hold Victoza as can cause diarrhea.  Blood sugars are up and we discussed how stress can raise blood sugar levels.

## 2018-06-09 NOTE — Patient Instructions (Addendum)
Cut atenolol in half until diarrhea resolves for 24 hours  Hold lisinopril until diarrhea resolves for at least 48 hours  Hold victoza until diarrhea is gone for 48 hours  I would try to get some gatorade in as well- at least every other beverage for right now  If you get to feeling worse - would go to the hospital

## 2018-06-09 NOTE — H&P (Signed)
History and Physical    [name] BOF:[id number] DOB: 1940/12/31 DOA: 06/09/2018  PCP: Marin Olp, Hahn   Patient coming from: Home   Chief Complaint: Diarrhea, low BP at PCP office   HPI: [name] is a 78 y.o. female with medical history significant for IBS, bronchiectasis, depression, anxiety, insomnia, hypertension, and type 2 diabetes mellitus, now presenting to the emergency department for evaluation of diarrhea and low blood pressure.  Patient has frequent loose stools, takes WelChol and Bentyl at home, but has had 5 days of watery diarrhea with several episodes daily, saw her PCP for this today, was found to have SBP in the 80s, and was directed to the ED.  There has not been any fevers, chills, or significant abdominal pain, but the patient developed nausea 5 days ago, had one episode of vomiting, and has since been experiencing roughly 5 episodes of watery diarrhea daily.  No melena or hematochezia.  Stool studies were performed on 06/06/2018, including C. difficile, and this was all negative.  Patient has had some lightheadedness upon standing, but no chest pain. She had a mild headache earlier in the course but that resolved and there is no change in vision or hearing, and no focal numbness or weakness.  ED Course: Upon arrival to the ED, patient is found to be afebrile, saturating well on room air, blood pressure 88/48, and vitals otherwise normal.  EKG features a sinus rhythm.  Chemistry panel is notable for potassium 3.3, bicarbonate 20, BUN 36, and creatinine 4.22, up from 0.67 last May.  CBC is notable for leukocytosis to 12,700.  Patient was given 1.5 L of lactated Ringer's in the ED, urinalysis and urine chemistries were ordered but not yet collected, blood pressure responded to fluids, and the patient will be admitted for further evaluation and management.  Review of Systems:  All other systems reviewed and apart from HPI, are negative.  Past Medical History:    Diagnosis Date  . Allergy   . Anemia   . Anxiety   . Arthritis    right knee;injections every 73months   . Asthma   . Bronchiectasis ([place])   . Cancer (HCC)    HX BREAST CANCER  . COMPRESSION FRACTURE, LUMBAR VERTEBRAE 08/21/2008   Qualifier: Diagnosis of  By: Arnoldo Morale Hahn, Balinda Quails   . Depression   . Diabetes mellitus    takes Amaryl and Januvia daily  . Difficulty sleeping   . Diverticulitis   . Early cataracts, bilateral   . Eczema   . Endometriosis   . Gastritis   . Glaucoma   . Hammer toe   . Headache(784.0)    Migraines  . Hypertension    takes Atenolol daily  . IBS (irritable bowel syndrome)   . Joint pain   . Kidney stone   . Neuropathy   . Open wound of second toe of left foot    at pre-op appt, wound appears clear of infection 1 month post removal of toenail, no obvious exudate present nor any redness,  . Osteomyelitis ([place])    left 2nd toe  . Osteopenia   . PONV (postoperative nausea and vomiting)   . Posterior tibial tendon dysfunction    left foot  . Scoliosis   . Severe esophageal dysplasia   . 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 Thyroid disease   . Vitamin D deficiency    takes Vit d daily  Past Surgical History:  Procedure Laterality Date  . ADENOIDECTOMY     at age 86  . biopsy on back     benign  . CATARACT EXTRACTION    . CHOLECYSTECTOMY  06/2008  . COLONOSCOPY  11/02/05  . COLONOSCOPY WITH PROPOFOL N/A 05/17/2017   Procedure: COLONOSCOPY WITH PROPOFOL;  Surgeon: [name];  Location: [facility];  Service: Endoscopy;  Laterality: N/A;  PT WILL BE ADMITTED THE DAY BEFORE FOR PREP PER ROBIN KB  . DILATION AND CURETTAGE OF UTERUS    . ENDOMETRIAL BIOPSY  06/22/2011   benign polyp, no hyperplasia  . excision on breast     internal infected suture from breast surgery  . excision removed from neck     infected lymph node  . FOOT SURGERY     left foot-cleaning out areas on toes at the wound  center-having MRI to determine if osteomyelitis  . HYSTEROSCOPY  06/22/11   PMB submucosal myoma  . JOINT REPLACEMENT     left total shoulder  . LAPAROSCOPIC OOPHORECTOMY Right 12/2004   absent LSO  . LAPAROTOMY    . MASTECTOMY Bilateral 1994 right, 1995 left  . SHOULDER HEMI-ARTHROPLASTY  01/01/2012   Procedure: SHOULDER HEMI-ARTHROPLASTY;  Surgeon: Roseanne Kaufman, Hahn;  Location: [facility];  Service: Orthopedics;  Laterality: Left;  Left Shoulder Hemi Arthroplasty with Repair and Reconstruction as Necessary   . THYROIDECTOMY     86yrs ago. follows endocrine  . TONSILLECTOMY    . TOTAL KNEE ARTHROPLASTY Right 12/30/2014   Procedure: RIGHT TOTAL KNEE ARTHROPLASTY;  Surgeon: Gaynelle Arabian, Hahn;  Location: [facility];  Service: Orthopedics;  Laterality: Right;  . TOTAL KNEE ARTHROPLASTY Left 11/29/2016   Procedure: LEFT TOTAL KNEE ARTHROPLASTY;  Surgeon: Gaynelle Arabian, Hahn;  Location: [facility];  Service: Orthopedics;  Laterality: Left;     reports that she has never smoked. She has never used smokeless tobacco. She reports that she does not drink alcohol or use drugs.  Allergies  Allergen Reactions  . Brimonidine Other (See Comments)    Made eyes and surrounding areas RED  . Cephalexin Diarrhea and Other (See Comments)    Patient can't remember reaction (per chart at [facility] states diarrhea)  . Erythromycin Ethylsuccinate Hives and Diarrhea  . Levaquin [Levofloxacin In D5w] Other (See Comments)    Pain in tendons   . Nitrofurantoin Other (See Comments)    Severe headache  . Percocet [Oxycodone-Acetaminophen] Itching    Family History  Problem Relation Age of Onset  . Arthritis Mother   . COPD Father   . Heart disease Father        MI 69 - states he died of lockjaw  . Hypertension Father   . Hyperlipidemia Father   . Pancreatic cancer Paternal Grandfather   . Colon cancer Neg Hx   . Esophageal cancer Neg Hx   . Rectal cancer Neg Hx   . Stomach cancer Neg Hx      Prior to Admission  medications   Medication Sig Start Date End Date Taking? Authorizing Provider  ALPRAZolam (XANAX) 0.25 MG tablet Take 1 tablet (0.25 mg total) by mouth daily as needed for anxiety. 04/28/18  Yes [name]  amoxicillin (AMOXIL) 500 MG capsule Take 2,000 mg by mouth See admin instructions. Take 2,000 mg by mouth one hour prior to dental procedures, to "pre-treat" 04/17/18  Yes Provider, Historical, Hahn  atenolol (TENORMIN) 25 MG tablet TAKE 1 TABLET BY MOUTH ONCE DAILY Patient  taking differently: Take 12.5 mg by mouth daily.  03/29/18  Yes Marin Olp, Hahn  Bacillus Coagulans-Inulin (ALIGN PREBIOTIC-PROBIOTIC PO) Take 1 capsule by mouth daily.    Yes Provider, Historical, Hahn  budesonide-formoterol (SYMBICORT) 160-4.5 MCG/ACT inhaler Inhale 2 puffs into the lungs 2 (two) times daily.   Yes Provider, Historical, Hahn  Cholecalciferol (VITAMIN D3) 2000 units capsule Take 2,000 Units by mouth daily.   Yes Provider, Historical, Hahn  Coenzyme Q10 300 MG CAPS Take 300 mg by mouth daily.   Yes Provider, Historical, Hahn  colesevelam (WELCHOL) 625 MG tablet TAKE 1 TABLET BY MOUTH THREE TIMES DAILY Patient taking differently: Take 625 mg by mouth 3 (three) times daily.  02/21/18  Yes Nandigam, Venia Minks, Hahn  dicyclomine (BENTYL) 10 MG capsule TAKE 2 CAPSULES BY MOUTH 4 TIMES DAILY AS NEEDED FOR  SPASMS Patient taking differently: Take 20 mg by mouth 4 (four) times daily.  06/06/18  Yes Nandigam, Venia Minks, Hahn  dorzolamide-timolol (COSOPT) 22.3-6.8 MG/ML ophthalmic solution Place 1 drop into both eyes 2 (two) times daily.  02/08/13  Yes Provider, Historical, Hahn  ESTRING 2 MG vaginal ring INSERT 1 RING  INTO VAGINA EVERY 3 MONTHS Patient taking differently: Place 2 mg vaginally every 3 (three) months.  01/25/18  Yes [name]  FLUoxetine (PROZAC) 40 MG capsule TAKE 1 CAPSULE BY MOUTH ONCE DAILY. Patient taking differently: Take 40 mg by mouth daily.  04/19/18  Yes Marin Olp, Hahn  glimepiride  (AMARYL) 2 MG tablet Take 1 tablet (2 mg total) by mouth daily with breakfast. 12/29/17  Yes Marin Olp, Hahn  latanoprost (XALATAN) 0.005 % ophthalmic solution Place 1 drop into both eyes at bedtime. 05/20/18  Yes Provider, Historical, Hahn  levalbuterol (XOPENEX) 0.63 MG/3ML nebulizer solution Take 3 mLs (0.63 mg total) by nebulization every 6 (six) hours as needed for wheezing or shortness of breath. 04/11/18  Yes Lauraine Rinne, NP  ondansetron (ZOFRAN) 4 MG tablet Take 1 tablet (4 mg total) by mouth 2 (two) times daily as needed for nausea or vomiting. 06/07/18  Yes Nandigam, Venia Minks, Hahn  Polyvinyl Alcohol-Povidone (REFRESH OP) Place 1 drop into both eyes daily as needed (dry eyes).    Yes Provider, Historical, Hahn  pramipexole (MIRAPEX) 0.5 MG tablet TAKE 1 TABLET BY MOUTH AT BEDTIME Patient taking differently: Take 0.5 mg by mouth at bedtime.  04/26/18  Yes Marin Olp, Hahn  Respiratory Therapy Supplies (FLUTTER) DEVI Use as directed 01/08/16  Yes Juanito Doom, Hahn  temazepam (RESTORIL) 15 MG capsule Take 2 capsules (30 mg total) by mouth at bedtime as needed for sleep. Patient taking differently: Take 30 mg by mouth at bedtime.  12/29/17  Yes Marin Olp, Hahn  cetirizine (ZYRTEC) 10 MG tablet Take 10 mg by mouth daily.    Provider, Historical, Hahn  Flaxseed, Linseed, (FLAX SEEDS PO) Take 1,400 mg by mouth daily.    Provider, Historical, Hahn  glucose blood (FREESTYLE LITE) test strip USE TO CHECK BLOOD SUGAR EVERY MORNING E11.9 05/17/18   Marin Olp, Hahn  lisinopril (PRINIVIL,ZESTRIL) 10 MG tablet TAKE 1 TABLET BY MOUTH ONCE DAILY Patient taking differently: Take 10 mg by mouth at bedtime.  02/01/18   Marin Olp, Hahn  Methylcellulose, Laxative, (CITRUCEL PO) Take 3 tablets by mouth at bedtime.    Provider, Historical, Hahn  Misc Natural Products (GRAPE SEED COMPLEX) CAPS Take 1 capsule by mouth daily.    Provider, Historical,  Hahn  Multiple Vitamin (MULTIVITAMIN WITH MINERALS)  TABS tablet Take 1 tablet by mouth daily.    Provider, Historical, Hahn  Omega-3 Fatty Acids (FISH OIL) 1200 MG CAPS Take 1,200 mg by mouth daily.    Provider, Historical, Hahn  Selenium 200 MCG CAPS Take 200 mcg by mouth daily.    Provider, Historical, Hahn  sucralfate (CARAFATE) 1 GM/10ML suspension Take 10 mLs (1 g total) by mouth 4 (four) times daily -  before meals and at bedtime. May take as needed Patient not taking: Reported on 06/09/2018 06/05/18   [name]  VICTOZA 18 MG/3ML SOPN INJECT 1.8 MG INTO THE SKIN DAILY Patient taking differently: Inject 1.8 mg into the skin daily.  01/16/18   Marin Olp, Hahn  vitamin B-12 (CYANOCOBALAMIN) 1000 MCG tablet Take 1,000 mcg by mouth daily.    Provider, Historical, Hahn    Physical Exam: Vitals:   06/09/18 1745 06/09/18 1838 06/09/18 1900 06/09/18 1930  BP:  (!) 102/49 (!) 102/51 (!) 119/54  Pulse:  77 79 82  Resp:  16 (!) 22 (!) 23  Temp:      TempSrc:      SpO2:  96% 93% 100%  Weight: 110.2 kg     Height: 6' (1.829 m)       Constitutional: NAD, calm  Eyes: PERTLA, lids and conjunctivae normal ENMT: Mucous membranes are moist. Posterior pharynx clear of any exudate or lesions.   Neck: normal, supple, no masses, no thyromegaly Respiratory: No wheezing, no crackles. Normal respiratory effort. Cardiovascular: S1 & S2 heard, regular rate and rhythm. No extremity edema. 2+ pedal pulses.   Abdomen: No distension, no tenderness, soft. Bowel sounds active.  Musculoskeletal: no clubbing / cyanosis. No joint deformity upper and lower extremities.    Skin: no significant rashes, lesions, ulcers. Warm, dry, well-perfused. Neurologic: No facial asymmetry. Sensation intact. Moving all extremities.  Psychiatric:  Alert and oriented x 3. Pleasant and cooperative.    Labs on Admission: I have personally reviewed following labs and imaging studies  CBC: Recent Labs  Lab 06/09/18 1805  WBC 12.7*  NEUTROABS 8.1*  HGB 12.7  HCT 38.8    MCV 93.5  PLT 017   Basic Metabolic Panel: Recent Labs  Lab 06/09/18 1805  NA 136  K 3.3*  CL 104  CO2 20*  GLUCOSE 151*  BUN 36*  CREATININE 4.22*  CALCIUM 9.0  MG 1.8   GFR: Estimated Creatinine Clearance: 15.2 mL/min (A) (by C-G formula based on SCr of 4.22 mg/dL (H)). Liver Function Tests: Recent Labs  Lab 06/09/18 1805  AST 19  ALT 21  ALKPHOS 79  BILITOT 0.4  PROT 5.8*  ALBUMIN 2.9*   Recent Labs  Lab 06/09/18 1805  LIPASE 25   No results for input(s): AMMONIA in the last 168 hours. Coagulation Profile: No results for input(s): INR, PROTIME in the last 168 hours. Cardiac Enzymes: No results for input(s): CKTOTAL, CKMB, CKMBINDEX, TROPONINI in the last 168 hours. BNP (last 3 results) No results for input(s): PROBNP in the last 8760 hours. HbA1C: No results for input(s): HGBA1C in the last 72 hours. CBG: No results for input(s): GLUCAP in the last 168 hours. Lipid Profile: No results for input(s): CHOL, HDL, LDLCALC, TRIG, CHOLHDL, LDLDIRECT in the last 72 hours. Thyroid Function Tests: No results for input(s): TSH, T4TOTAL, FREET4, T3FREE, THYROIDAB in the last 72 hours. Anemia Panel: No results for input(s): VITAMINB12, FOLATE, FERRITIN, TIBC, IRON, RETICCTPCT in the last  72 hours. Urine analysis:    Component Value Date/Time   COLORURINE AMBER (A) 10/04/2015 2215   APPEARANCEUR CLOUDY (A) 10/04/2015 2215   LABSPEC 1.024 10/04/2015 2215   PHURINE 5.5 10/04/2015 2215   GLUCOSEU NEGATIVE 10/04/2015 2215   HGBUR NEGATIVE 10/04/2015 2215   BILIRUBINUR Negative 10/06/2017 1424   KETONESUR 15 (A) 10/04/2015 2215   PROTEINUR Positive (A) 10/06/2017 1424   PROTEINUR 100 (A) 10/04/2015 2215   UROBILINOGEN 0.2 10/06/2017 1424   UROBILINOGEN 1.0 12/24/2014 1430   NITRITE Negative 10/06/2017 1424   NITRITE NEGATIVE 10/04/2015 2215   LEUKOCYTESUR Moderate (2+) (A) 10/06/2017 1424   Sepsis Labs: @LABRCNTIP (procalcitonin:4,lacticidven:4) )No results  found for this or any previous visit (from the past 240 hour(s)).   Radiological Exams on Admission: No results found.  EKG: Independently reviewed. Sinus rhythm.   Assessment/Plan   1. Acute kidney injury  - Presents with 5 days of watery diarrhea, found to be hypotensive in PCP office and directed to ED  - SCr is found to be 4.22 in ED, up from 0.67 on most recent chemistries from May 2019  - Likely prerenal azotemia in setting of watery diarrhea with hypotension; lisinopril could be contributing as well   - She was treated with 1.5 liters LR in ED and UA with urine chemistries was ordered but not yet collected  - Continue fluid-resuscitation, renally-dose medications, avoid nephrotoxins, follow-up urine chemistries and renal US, repeat chem panel in am    2. Diarrhea  - Patient has hx of IBS and chronic diarrhea, now presenting with 5 days of watery diarrhea  - There had been ~5 episodes daily until today; no BM yet today  - Outpatient stool studies, including C diff, negative  - No fever here and exam is benign  - Continue to replace volume and electrolytes, use loperamide as-needed   3. Hypotension; history of HTN  - Presented to PCP office today with several days of watery diarrhea and was found to have SBP in upper 80's  - Initial BP 88/48 in ED, secondary to hypovolemia and responding to IVF  - Hold antihypertensives, continue IVF hydration   4. Type II DM  - A1c was only 5.6% in August '19  - Managed at home with glimepiride and Victoza, held on admission  - Check CBG's and use a low-intensity SSI with Novolog as needed while in hospital    5. Hypokalemia  - Serum potassium is 3.3 in ED, secondary to GI-losses  - KCl added to IVF  - Repeat chem panel in am   6. Bronchiectasis  - Treated with abx and prednisone last month for exacerbation  - Currently stable  - Continue ICS/LABA and as-needed SABA nebs   7. Depression; anxiety; insomnia   - Continue Prozac,  as-needed Xanax, and as-needed Restoril     DVT prophylaxis: sq heparin  Code Status: Full  Family Communication: Husband updated at bedside   Consults called: None Admission status: Inpatient     Vianne Bulls, Hahn [facility] Pager [phone]  If 7PM-7AM, please contact night-coverage [email or web address] Password [facility]  06/09/2018, 8:15 PM

## 2018-06-10 LAB — CBC WITH DIFFERENTIAL/PLATELET
Abs Immature Granulocytes: 0.09 10*3/uL — ABNORMAL HIGH (ref 0.00–0.07)
Basophils Absolute: 0 10*3/uL (ref 0.0–0.1)
Basophils Relative: 0 %
Eosinophils Absolute: 0.8 10*3/uL — ABNORMAL HIGH (ref 0.0–0.5)
Eosinophils Relative: 9 %
HCT: 34 % — ABNORMAL LOW (ref 36.0–46.0)
Hemoglobin: 11 g/dL — ABNORMAL LOW (ref 12.0–15.0)
Immature Granulocytes: 1 %
Lymphocytes Relative: 17 %
Lymphs Abs: 1.6 10*3/uL (ref 0.7–4.0)
MCH: 30.1 pg (ref 26.0–34.0)
MCHC: 32.4 g/dL (ref 30.0–36.0)
MCV: 93.2 fL (ref 80.0–100.0)
Monocytes Absolute: 0.9 10*3/uL (ref 0.1–1.0)
Monocytes Relative: 9 %
Neutro Abs: 6 10*3/uL (ref 1.7–7.7)
Neutrophils Relative %: 64 %
Platelets: 205 10*3/uL (ref 150–400)
RBC: 3.65 MIL/uL — AB (ref 3.87–5.11)
RDW: 14.4 % (ref 11.5–15.5)
WBC: 9.4 10*3/uL (ref 4.0–10.5)
nRBC: 0 % (ref 0.0–0.2)

## 2018-06-10 LAB — BASIC METABOLIC PANEL
Anion gap: 9 (ref 5–15)
BUN: 33 mg/dL — AB (ref 8–23)
CO2: 19 mmol/L — ABNORMAL LOW (ref 22–32)
Calcium: 8.5 mg/dL — ABNORMAL LOW (ref 8.9–10.3)
Chloride: 107 mmol/L (ref 98–111)
Creatinine, Ser: 2.67 mg/dL — ABNORMAL HIGH (ref 0.44–1.00)
GFR calc Af Amer: 19 mL/min — ABNORMAL LOW (ref >60)
GFR calc non Af Amer: 16 mL/min — ABNORMAL LOW (ref >60)
Glucose, Bld: 183 mg/dL — ABNORMAL HIGH (ref 70–99)
Potassium: 3.7 mmol/L (ref 3.5–5.1)
Sodium: 135 mmol/L (ref 135–145)

## 2018-06-10 LAB — GLUCOSE, CAPILLARY
Glucose-Capillary: 151 mg/dL — ABNORMAL HIGH (ref 70–99)
Glucose-Capillary: 134 mg/dL — ABNORMAL HIGH (ref 70–99)
Glucose-Capillary: 132 mg/dL — ABNORMAL HIGH (ref 70–99)
Glucose-Capillary: 206 mg/dL — ABNORMAL HIGH (ref 70–99)

## 2018-06-10 MED ORDER — LACTATED RINGERS IV SOLN
INTRAVENOUS | Status: DC
  Administered 2018-06-10 (×2): via INTRAVENOUS

## 2018-06-10 NOTE — Progress Notes (Signed)
Paged MD Maylene Roes regarding patient's low diastylic BP, patient is asymptomatic at this moment, awaiting a callback Neta Mends RN 6:37 PM 06-10-2018

## 2018-06-10 NOTE — Progress Notes (Signed)
PROGRESS NOTE    [name]  OZH:[id number] DOB: 06/19/1940 DOA: 06/09/2018 PCP: Marin Olp, MD     Brief Narrative:  [name] is a 78 y.o. female with medical history significant for IBS, bronchiectasis, depression, anxiety, insomnia, hypertension, and type 2 diabetes mellitus, now presenting to the emergency department for evaluation of diarrhea and low blood pressure.  Patient has frequent loose stools, takes WelChol and Bentyl at home, but has had 5 days of watery diarrhea with several episodes daily, saw her PCP for this today, was found to have SBP in the 80s, and was directed to the ED.  There has not been any fevers, chills, or significant abdominal pain, but the patient developed nausea 5 days ago, had one episode of vomiting, and has since been experiencing roughly 5 episodes of watery diarrhea daily.  No melena or hematochezia.  Stool studies were performed on 06/06/2018, including C. difficile, and this was all negative.  In the emergency department, her creatinine was elevated at 4.22 up from her baseline of 0.67.  She was started on IV fluids and admitted for further observation and treatment.  New events last 24 hours / Subjective: No further bowel movement since yesterday at 5 AM.  Tolerating full liquid diet so far  Assessment & Plan:   Principal Problem:   AKI (acute kidney injury) ([place]) Active Problems:   Diabetes mellitus type II, controlled ([place])   Depression with anxiety   Essential hypertension, benign   Bronchiectasis ([place])   Diarrhea   Hypokalemia   Hypotension due to hypovolemia   AKI Prerenal in etiology due to diarrhea, volume loss as well as hypotension and concomitant lisinopril use Renal ultrasound unremarkable Improving with IV fluids Continue to monitor BMP, hold lisinopril  Diarrhea Seems to be resolved at this point Outpatient stool studies including C. difficile were negative  Hypotension Resolved, blood pressure stable this  morning 101/44 Hold lisinopril  Type 2 diabetes Hold home glimepiride, Victoza Sliding-scale insulin  Depression and anxiety Continue Prozac, Xanax as needed   DVT prophylaxis: Subcutaneous heparin Code Status: Full code Family Communication: No family at bedside Disposition Plan: Pending further improvement in creatinine, advance diet today   Consultants:   None  Procedures:   None  Antimicrobials:  Anti-infectives (From admission, onward)   None       Objective: Vitals:   06/09/18 2123 06/10/18 0114 06/10/18 0414 06/10/18 0900  BP: (!) 115/56 (!) 102/44 106/66 (!) 101/44  Pulse: 88 86 87 81  Resp: 18 18 18  (!) 24  Temp: 98.6 F (37 C) 99.4 F (37.4 C) 99.5 F (37.5 C) 97.6 F (36.4 C)  TempSrc: Oral Oral Oral Oral  SpO2: 95% 94% 97% 97%  Weight:   114.6 kg   Height:   6' (1.829 m)     Intake/Output Summary (Last 24 hours) at 06/10/2018 1138 Last data filed at 06/10/2018 0930 Gross per 24 hour  Intake 3227.15 ml  Output 350 ml  Net 2877.15 ml   Filed Weights   06/09/18 1745 06/10/18 0414  Weight: 110.2 kg 114.6 kg    Examination:  General exam: Appears calm and comfortable  Respiratory system: Clear to auscultation. Respiratory effort normal. Cardiovascular system: S1 & S2 heard, RRR. No JVD, murmurs, rubs, gallops or clicks. No pedal edema. Gastrointestinal system: Abdomen is nondistended, soft and nontender. No organomegaly or masses felt. Normal bowel sounds heard. Central nervous system: Alert and oriented. No focal neurological deficits. Extremities: Symmetric 5 x  5 power. Skin: No rashes, lesions or ulcers Psychiatry: Judgement and insight appear normal. Mood & affect appropriate.   Data Reviewed: I have personally reviewed following labs and imaging studies  CBC: Recent Labs  Lab 06/09/18 1805 06/10/18 0605  WBC 12.7* 9.4  NEUTROABS 8.1* 6.0  HGB 12.7 11.0*  HCT 38.8 34.0*  MCV 93.5 93.2  PLT 271 740   Basic Metabolic  Panel: Recent Labs  Lab 06/09/18 1805 06/10/18 0605  NA 136 135  K 3.3* 3.7  CL 104 107  CO2 20* 19*  GLUCOSE 151* 183*  BUN 36* 33*  CREATININE 4.22* 2.67*  CALCIUM 9.0 8.5*  MG 1.8  --    GFR: Estimated Creatinine Clearance: 24.6 mL/min (A) (by C-G formula based on SCr of 2.67 mg/dL (H)). Liver Function Tests: Recent Labs  Lab 06/09/18 1805  AST 19  ALT 21  ALKPHOS 79  BILITOT 0.4  PROT 5.8*  ALBUMIN 2.9*   Recent Labs  Lab 06/09/18 1805  LIPASE 25   No results for input(s): AMMONIA in the last 168 hours. Coagulation Profile: No results for input(s): INR, PROTIME in the last 168 hours. Cardiac Enzymes: No results for input(s): CKTOTAL, CKMB, CKMBINDEX, TROPONINI in the last 168 hours. BNP (last 3 results) No results for input(s): PROBNP in the last 8760 hours. HbA1C: No results for input(s): HGBA1C in the last 72 hours. CBG: Recent Labs  Lab 06/09/18 2156 06/10/18 0804  GLUCAP 127* 151*   Lipid Profile: No results for input(s): CHOL, HDL, LDLCALC, TRIG, CHOLHDL, LDLDIRECT in the last 72 hours. Thyroid Function Tests: No results for input(s): TSH, T4TOTAL, FREET4, T3FREE, THYROIDAB in the last 72 hours. Anemia Panel: No results for input(s): VITAMINB12, FOLATE, FERRITIN, TIBC, IRON, RETICCTPCT in the last 72 hours. Sepsis Labs: No results for input(s): PROCALCITON, LATICACIDVEN in the last 168 hours.  No results found for this or any previous visit (from the past 240 hour(s)).     Radiology Studies: US Renal  Result Date: 06/09/2018 CLINICAL DATA:  Acute renal failure. EXAM: RENAL / URINARY TRACT ULTRASOUND COMPLETE COMPARISON:  CT abdomen and pelvis Oct 04, 2015 FINDINGS: Right Kidney: Renal measurements: 12 x 6.3 x 6.9 cm = volume: 273 mL . Echogenicity within normal limits. No mass or hydronephrosis visualized. Left Kidney: Renal measurements: 12.2 x 6.7 x 6.5 cm = volume: Sooner in 74 mL. Echogenicity within normal limits. No mass or  hydronephrosis visualized. Bladder: Appears normal for degree of bladder distention. Bilateral ureteral jets documented. IMPRESSION: Normal renal ultrasound. Electronically Signed   By: Elon Alas M.D.   On: 06/09/2018 20:28      Scheduled Meds: . dicyclomine  20 mg Oral QID  . dorzolamide-timolol  1 drop Both Eyes BID  . FLUoxetine  40 mg Oral Daily  . heparin  5,000 Units Subcutaneous Q8H  . insulin aspart  0-5 Units Subcutaneous QHS  . insulin aspart  0-9 Units Subcutaneous TID WC  . latanoprost  1 drop Both Eyes QHS  . mometasone-formoterol  2 puff Inhalation BID  . pramipexole  0.125 mg Oral QHS  . sodium chloride flush  3 mL Intravenous Q12H   Continuous Infusions: . lactated ringers 75 mL/hr at 06/10/18 1109     LOS: 1 day    Time spent: 25 minutes   Dessa Phi, DO [facility] [email or web address] 06/10/2018, 11:38 AM

## 2018-06-10 NOTE — Progress Notes (Signed)
Pt. Arrived to unit via stretcher from ED in alert and stable condition. Pt. States she weak and unable to stand at this time. Pt. Oriented to the room, call light placed within reach, pt. Placed on telemetry, [place] notified. Of pts. Arrival to the unit. Family at bedside with pt. Pt. Denies any pain or discomfort at this time. No distress noted. RN will continue to monitor pt. For changes in condition.

## 2018-06-10 NOTE — Plan of Care (Signed)
  Problem: Safety: Goal: Ability to remain free from injury will improve Outcome: Progressing   Problem: Activity: Goal: Risk for activity intolerance will decrease Outcome: Not Progressing  Pt. States she is weak and unable to tolerate standing at this time

## 2018-06-11 LAB — BASIC METABOLIC PANEL
Anion gap: 8 (ref 5–15)
BUN: 22 mg/dL (ref 8–23)
CO2: 19 mmol/L — ABNORMAL LOW (ref 22–32)
CREATININE: 1.11 mg/dL — AB (ref 0.44–1.00)
Calcium: 8.9 mg/dL (ref 8.9–10.3)
Chloride: 113 mmol/L — ABNORMAL HIGH (ref 98–111)
GFR calc Af Amer: 55 mL/min — ABNORMAL LOW (ref >60)
GFR calc non Af Amer: 48 mL/min — ABNORMAL LOW (ref >60)
Glucose, Bld: 209 mg/dL — ABNORMAL HIGH (ref 70–99)
Potassium: 3.8 mmol/L (ref 3.5–5.1)
Sodium: 140 mmol/L (ref 135–145)

## 2018-06-11 LAB — GLUCOSE, CAPILLARY
GLUCOSE-CAPILLARY: 187 mg/dL — AB (ref 70–99)
GLUCOSE-CAPILLARY: 202 mg/dL — AB (ref 70–99)
Glucose-Capillary: 171 mg/dL — ABNORMAL HIGH (ref 70–99)

## 2018-06-11 LAB — UREA NITROGEN, URINE: Urea Nitrogen, Ur: 219 mg/dL

## 2018-06-11 NOTE — Progress Notes (Signed)
Patient is having difficulty standing when attempting to get her in the wheelchair for discharge. Husband is at bedside. Patient states her "knees feel like they are giving out." MD made aware. Discharge orders have been cancelled and physical therapy is to consult with patient. Patient has been assisted back to bed with two person assist.

## 2018-06-11 NOTE — Evaluation (Signed)
Physical Therapy Evaluation Patient Details Name: [name] MRN: [id number] DOB: [birth year] Today's Date: 06/11/2018   History of Present Illness  78 y.o. female with medical history significant for IBS, bronchiectasis, depression, anxiety, insomnia, hypertension, and type 2 diabetes mellitus, now presenting to the emergency department for evaluation of diarrhea and low blood pressure. Dx with AKI and admitted 06/09/18.  Clinical Impression  PTA pt living with husband and 24 year old grandson in 2 story house with 2 steps to enter, pt sleeps in den on first floor and utilizes tub/shower on first level. Pt was independent in limited community ambulation with RW, going out to eat every day and was able to bathe and dress herself, husband assists with iADLs.  RN reports when trying to get pt into w/c for discharge pt barely able to stand and had bilateral knee buckling requiring maxAx2 for safely getting back to bed. At time of evaluation 2 hours later, pt limited in safe mobility by decreased sensation in bilateral feet. Pt required min guard for bed mobility, minA-min guard for transfers, and min guard for ambulation of 20 feet. PT recommending HHPT to insure safe mobility in her home environment at d/c. PT will follow back for stair training tomorrow.      Follow Up Recommendations Home health PT    Equipment Recommendations  None recommended by PT    Recommendations for Other Services       Precautions / Restrictions Precautions Precautions: Fall Precaution Comments: hx of falls Restrictions Weight Bearing Restrictions: No      Mobility  Bed Mobility Overal bed mobility: Needs Assistance Bed Mobility: Supine to Sit     Supine to sit: Min guard     General bed mobility comments: min guard for safety  Transfers Overall transfer level: Needs assistance Equipment used: Rolling walker (2 wheeled) Transfers: Sit to/from [facility] Sit to Stand: Min  assist Stand pivot transfers: Min guard       General transfer comment: min A for power up and steadying in RW from bed, min guard for stand pivot from recliner to [facility]  Ambulation/Gait Ambulation/Gait assistance: Min guard Gait Distance (Feet): 20 Feet Assistive device: Rolling walker (2 wheeled) Gait Pattern/deviations: Step-through pattern;Shuffle Gait velocity: slowed Gait velocity interpretation: <1.8 ft/sec, indicate of risk for recurrent falls General Gait Details: hands on min guard for safety, given prior knee buckling with RN, however pt with steady gait no knee buckling of LoB        Balance Overall balance assessment: Mild deficits observed, not formally tested                                           Pertinent Vitals/Pain Pain Assessment: No/denies pain    Home Living Family/patient expects to be discharged to:: Private residence Living Arrangements: Spouse/significant other Available Help at Discharge: Family;Available 24 hours/day Type of Home: House Home Access: Stairs to enter Entrance Stairs-Rails: None Entrance Stairs-Number of Steps: 2 Home Layout: One level(sleeps in recliner) Home Equipment: Bedside commode;Walker - 2 wheels;Cane - single point;Tub bench;Grab bars - toilet;Hand held shower head;Transport chair      Prior Function Level of Independence: Independent with assistive device(s)         Comments: pt reports using RW or cane for ambulation and ascent/descent of 2 steps into home. Pt utilizes a BSC and sleeps in a recliner, but  is able to get into tub and utilize a tub bench to bathe and is independent in dressing     Hand Dominance   Dominant Hand: Right    Extremity/Trunk Assessment   Upper Extremity Assessment Upper Extremity Assessment: LUE deficits/detail LUE Deficits / Details: L shoulder replacement, AROM WFL, strength grossly 3+/5, elbow, wrist and hand WFL  LUE Sensation: decreased light touch;history of  peripheral neuropathy(in 4th and 5th digit )    Lower Extremity Assessment Lower Extremity Assessment: LLE deficits/detail;RLE deficits/detail RLE Deficits / Details: R TKA, hip, knee and ankle ROM WFL, strength grossly assessed at 4/5 RLE Sensation: history of peripheral neuropathy;decreased light touch(new onset of numbness in outer thigh ) LLE Deficits / Details: L TKA, hip, knee and ankle ROM WFL, strength grossly assessed at 4/5 LLE Sensation: history of peripheral neuropathy(to mid calf)    Cervical / Trunk Assessment Cervical / Trunk Assessment: Kyphotic  Communication   Communication: No difficulties     General Comments General comments (skin integrity, edema, etc.): BP 151/61 HR 100 bpm with ambulation to chair, Pt with new onset of R thigh numbness, no report of back pain, hip flexion [facility]    Exercises     Assessment/Plan    PT Assessment Patient needs continued PT services  PT Problem List Decreased activity tolerance;Decreased mobility;Impaired sensation       PT Treatment Interventions DME instruction;Gait training;Stair training;Functional mobility training;Therapeutic activities;Therapeutic exercise;Balance training;Patient/family education    PT Goals (Current goals can be found in the Care Plan section)  Acute Rehab PT Goals Patient Stated Goal: go home PT Goal Formulation: With patient Time For Goal Achievement: 06/25/18 Potential to Achieve Goals: Good    Frequency Min 3X/week    AM-PAC PT "6 Clicks" Mobility  Outcome Measure Help needed turning from your back to your side while in a flat bed without using bedrails?: A Little Help needed moving from lying on your back to sitting on the side of a flat bed without using bedrails?: A Little Help needed moving to and from a bed to a chair (including a wheelchair)?: A Little Help needed standing up from a chair using your arms (e.g., wheelchair or bedside chair)?: A Little Help needed to walk in hospital  room?: A Little Help needed climbing 3-5 steps with a railing? : A Lot 6 Click Score: 17    End of Session Equipment Utilized During Treatment: Gait belt Activity Tolerance: Patient tolerated treatment well Patient left: in chair;with call bell/phone within reach;with nursing/sitter in room Nurse Communication: Mobility status PT Visit Diagnosis: Muscle weakness (generalized) (M62.81);Difficulty in walking, not elsewhere classified (R26.2);Other symptoms and signs involving the nervous system (R29.898)    Time: 1962-2297 PT Time Calculation (min) (ACUTE ONLY): 36 min   Charges:   PT Evaluation $PT Eval Moderate Complexity: 1 Mod PT Treatments $Gait Training: 8-22 mins        Merriam Brandner B. Migdalia Dk PT, DPT Acute Rehabilitation Services Pager [phone] Office [phone]   [place] 06/11/2018, 5:38 PM

## 2018-06-11 NOTE — Discharge Summary (Addendum)
Physician Discharge Summary  [name] TKP:[id number] DOB: August 03, 1940 DOA: 06/09/2018  PCP: Marin Olp, MD  Admit date: 06/09/2018 Discharge date: 06/12/2018  Admitted From: Home Disposition:  Home  Recommendations for Outpatient Follow-up:  1. Follow up with PCP in 1 week 2. Please obtain BMP in 1 week   Discharge Condition: Stable CODE STATUS: Full  Diet recommendation: Carb modified diet   Brief/Interim Summary: [name] is a 78 y.o.femalewith medical history significant forIBS, bronchiectasis, depression, anxiety, insomnia, hypertension, and type 2 diabetes mellitus, now presenting to the emergency department for evaluation of diarrhea and low blood pressure. Patient has frequent loose stools, takes WelChol and Bentyl at home, but has had 5 days of watery diarrhea with several episodes daily, saw her PCP for this today, was found to have SBP in the 80s, and was directed to the ED. There has not been any fevers, chills, or significant abdominal pain, but the patient developed nausea 5 days ago, had one episode of vomiting, and has since been experiencing roughly 5 episodes of watery diarrhea daily. No melena or hematochezia. Stool studies were performed on 06/06/2018, including C. difficile, and this was all negative.  In the emergency department, her creatinine was elevated at 4.22 up from her baseline of 0.67.  She was started on IV fluids and admitted for further observation and treatment.  Discharge Diagnoses:  AKI Prerenal in etiology due to diarrhea, volume loss as well as hypotension and concomitant lisinopril use Renal ultrasound unremarkable Improving with IV fluids Continue to monitor BMP, hold lisinopril  Diarrhea Seems to be resolved at this point Outpatient stool studies including C. difficile were negative  Hypotension Resolved, blood pressure stable this morning  Hold lisinopril  Type 2 diabetes Sliding-scale insulin while inpatient.  Resume home meds on discharge.   Depression and anxiety Continue Prozac, Xanax as needed   Discharge Instructions  Discharge Instructions    Call MD for:  difficulty breathing, headache or visual disturbances   Complete by:  As directed    Call MD for:  extreme fatigue   Complete by:  As directed    Call MD for:  hives   Complete by:  As directed    Call MD for:  persistant dizziness or light-headedness   Complete by:  As directed    Call MD for:  persistant nausea and vomiting   Complete by:  As directed    Call MD for:  severe uncontrolled pain   Complete by:  As directed    Call MD for:  temperature >100.4   Complete by:  As directed    Diet Carb Modified   Complete by:  As directed    Discharge instructions   Complete by:  As directed    You were cared for by a hospitalist during your hospital stay. If you have any questions about your discharge medications or the care you received while you were in the hospital after you are discharged, you can call the unit and ask to speak with the hospitalist on call if the hospitalist that took care of you is not available. Once you are discharged, your primary care physician will handle any further medical issues. Please note that NO REFILLS for any discharge medications will be authorized once you are discharged, as it is imperative that you return to your primary care physician (or establish a relationship with a primary care physician if you do not have one) for your aftercare needs so that they can  reassess your need for medications and monitor your lab values.   Increase activity slowly   Complete by:  As directed      Allergies as of 06/11/2018      Reactions   Brimonidine Other (See Comments)   Made eyes and surrounding areas RED   Cephalexin Diarrhea, Other (See Comments)   Patient can't remember reaction (per chart at [facility] states diarrhea)   Erythromycin Ethylsuccinate Hives, Diarrhea   Levaquin [levofloxacin In D5w] Other  (See Comments)   Pain in tendons    Nitrofurantoin Other (See Comments)   Severe headache   Percocet [oxycodone-acetaminophen] Itching      Medication List    STOP taking these medications   lisinopril 10 MG tablet Commonly known as:  PRINIVIL,ZESTRIL     TAKE these medications   ALIGN PREBIOTIC-PROBIOTIC PO Take 1 capsule by mouth daily.   ALPRAZolam 0.25 MG tablet Commonly known as:  XANAX Take 1 tablet (0.25 mg total) by mouth daily as needed for anxiety.   amoxicillin 500 MG capsule Commonly known as:  AMOXIL Take 2,000 mg by mouth See admin instructions. Take 2,000 mg by mouth one hour prior to dental procedures, to "pre-treat"   atenolol 25 MG tablet Commonly known as:  TENORMIN TAKE 1 TABLET BY MOUTH ONCE DAILY What changed:  how much to take   budesonide-formoterol 160-4.5 MCG/ACT inhaler Commonly known as:  SYMBICORT Inhale 2 puffs into the lungs 2 (two) times daily.   cetirizine 10 MG tablet Commonly known as:  ZYRTEC Take 10 mg by mouth daily.   CITRUCEL PO Take 3 tablets by mouth at bedtime.   Coenzyme Q10 300 MG Caps Take 300 mg by mouth daily.   colesevelam 625 MG tablet Commonly known as:  WELCHOL TAKE 1 TABLET BY MOUTH THREE TIMES DAILY   dicyclomine 10 MG capsule Commonly known as:  BENTYL TAKE 2 CAPSULES BY MOUTH 4 TIMES DAILY AS NEEDED FOR  SPASMS What changed:  See the new instructions.   dorzolamide-timolol 22.3-6.8 MG/ML ophthalmic solution Commonly known as:  COSOPT Place 1 drop into both eyes 2 (two) times daily.   ESTRING 2 MG vaginal ring Generic drug:  estradiol INSERT 1 RING  INTO VAGINA EVERY 3 MONTHS What changed:    how much to take  how to take this  when to take this  additional instructions   Fish Oil 1200 MG Caps Take 1,200 mg by mouth daily.   FLAX SEEDS PO Take 1,400 mg by mouth daily.   FLUoxetine 40 MG capsule Commonly known as:  PROZAC TAKE 1 CAPSULE BY MOUTH ONCE DAILY.   FLUTTER Devi Use as  directed   glimepiride 2 MG tablet Commonly known as:  AMARYL Take 1 tablet (2 mg total) by mouth daily with breakfast.   glucose blood test strip Commonly known as:  FREESTYLE LITE USE TO CHECK BLOOD SUGAR EVERY MORNING E11.9   GRAPE SEED COMPLEX Caps Take 1 capsule by mouth daily.   latanoprost 0.005 % ophthalmic solution Commonly known as:  XALATAN Place 1 drop into both eyes at bedtime.   levalbuterol 0.63 MG/3ML nebulizer solution Commonly known as:  XOPENEX Take 3 mLs (0.63 mg total) by nebulization every 6 (six) hours as needed for wheezing or shortness of breath.   multivitamin with minerals Tabs tablet Take 1 tablet by mouth daily.   ondansetron 4 MG tablet Commonly known as:  ZOFRAN Take 1 tablet (4 mg total) by mouth 2 (two) times daily as  needed for nausea or vomiting.   pramipexole 0.5 MG tablet Commonly known as:  MIRAPEX TAKE 1 TABLET BY MOUTH AT BEDTIME   REFRESH OP Place 1 drop into both eyes daily as needed (dry eyes).   Selenium 200 MCG Caps Take 200 mcg by mouth daily.   sucralfate 1 GM/10ML suspension Commonly known as:  CARAFATE Take 10 mLs (1 g total) by mouth 4 (four) times daily -  before meals and at bedtime. May take as needed   temazepam 15 MG capsule Commonly known as:  RESTORIL Take 2 capsules (30 mg total) by mouth at bedtime as needed for sleep. What changed:  when to take this   VICTOZA 18 MG/3ML Sopn Generic drug:  liraglutide INJECT 1.8 MG INTO THE SKIN DAILY What changed:  See the new instructions.   vitamin B-12 1000 MCG tablet Commonly known as:  CYANOCOBALAMIN Take 1,000 mcg by mouth daily.   Vitamin D3 50 MCG (2000 UT) capsule Take 2,000 Units by mouth daily.       Allergies  Allergen Reactions  . Brimonidine Other (See Comments)    Made eyes and surrounding areas RED  . Cephalexin Diarrhea and Other (See Comments)    Patient can't remember reaction (per chart at [facility] states diarrhea)  . Erythromycin  Ethylsuccinate Hives and Diarrhea  . Levaquin [Levofloxacin In D5w] Other (See Comments)    Pain in tendons   . Nitrofurantoin Other (See Comments)    Severe headache  . Percocet [Oxycodone-Acetaminophen] Itching    Consultations:  None   Procedures/Studies: US Renal  Result Date: 06/09/2018 CLINICAL DATA:  Acute renal failure. EXAM: RENAL / URINARY TRACT ULTRASOUND COMPLETE COMPARISON:  CT abdomen and pelvis Oct 04, 2015 FINDINGS: Right Kidney: Renal measurements: 12 x 6.3 x 6.9 cm = volume: 273 mL . Echogenicity within normal limits. No mass or hydronephrosis visualized. Left Kidney: Renal measurements: 12.2 x 6.7 x 6.5 cm = volume: Sooner in 74 mL. Echogenicity within normal limits. No mass or hydronephrosis visualized. Bladder: Appears normal for degree of bladder distention. Bilateral ureteral jets documented. IMPRESSION: Normal renal ultrasound. Electronically Signed   By: Elon Alas M.D.   On: 06/09/2018 20:28      Discharge Exam: Vitals:   06/11/18 0117 06/11/18 0643  BP: 100/73 (!) 127/46  Pulse: 87 84  Resp:    Temp: 98.4 F (36.9 C)   SpO2: 94% 96%    General: Pt is alert, awake, not in acute distress Cardiovascular: RRR, S1/S2 +, no rubs, no gallops Respiratory: CTA bilaterally, no wheezing, no rhonchi Abdominal: Soft, NT, ND, bowel sounds + Extremities: no edema, no cyanosis    The results of significant diagnostics from this hospitalization (including imaging, microbiology, ancillary and laboratory) are listed below for reference.     Microbiology: No results found for this or any previous visit (from the past 240 hour(s)).   Labs: BNP (last 3 results) No results for input(s): BNP in the last 8760 hours. Basic Metabolic Panel: Recent Labs  Lab 06/09/18 1805 06/10/18 0605 06/11/18 0555  NA 136 135 140  K 3.3* 3.7 3.8  CL 104 107 113*  CO2 20* 19* 19*  GLUCOSE 151* 183* 209*  BUN 36* 33* 22  CREATININE 4.22* 2.67* 1.11*  CALCIUM 9.0 8.5*  8.9  MG 1.8  --   --    Liver Function Tests: Recent Labs  Lab 06/09/18 1805  AST 19  ALT 21  ALKPHOS 79  BILITOT 0.4  PROT 5.8*  ALBUMIN 2.9*   Recent Labs  Lab 06/09/18 1805  LIPASE 25   No results for input(s): AMMONIA in the last 168 hours. CBC: Recent Labs  Lab 06/09/18 1805 06/10/18 0605  WBC 12.7* 9.4  NEUTROABS 8.1* 6.0  HGB 12.7 11.0*  HCT 38.8 34.0*  MCV 93.5 93.2  PLT 271 205   Cardiac Enzymes: No results for input(s): CKTOTAL, CKMB, CKMBINDEX, TROPONINI in the last 168 hours. BNP: Invalid input(s): POCBNP CBG: Recent Labs  Lab 06/10/18 0804 06/10/18 1240 06/10/18 1703 06/10/18 2120 06/11/18 0757  GLUCAP 151* 134* 132* 206* 171*   D-Dimer No results for input(s): DDIMER in the last 72 hours. Hgb A1c No results for input(s): HGBA1C in the last 72 hours. Lipid Profile No results for input(s): CHOL, HDL, LDLCALC, TRIG, CHOLHDL, LDLDIRECT in the last 72 hours. Thyroid function studies No results for input(s): TSH, T4TOTAL, T3FREE, THYROIDAB in the last 72 hours.  Invalid input(s): FREET3 Anemia work up No results for input(s): VITAMINB12, FOLATE, FERRITIN, TIBC, IRON, RETICCTPCT in the last 72 hours. Urinalysis    Component Value Date/Time   COLORURINE YELLOW 06/09/2018 2216   APPEARANCEUR HAZY (A) 06/09/2018 2216   LABSPEC 1.011 06/09/2018 2216   PHURINE 5.0 06/09/2018 2216   GLUCOSEU NEGATIVE 06/09/2018 2216   HGBUR SMALL (A) 06/09/2018 2216   BILIRUBINUR NEGATIVE 06/09/2018 2216   BILIRUBINUR Negative 10/06/2017 1424   KETONESUR NEGATIVE 06/09/2018 2216   PROTEINUR 30 (A) 06/09/2018 2216   UROBILINOGEN 0.2 10/06/2017 1424   UROBILINOGEN 1.0 12/24/2014 1430   NITRITE NEGATIVE 06/09/2018 2216   LEUKOCYTESUR NEGATIVE 06/09/2018 2216   Sepsis Labs Invalid input(s): PROCALCITONIN,  WBC,  LACTICIDVEN Microbiology No results found for this or any previous visit (from the past 240 hour(s)).   Patient was seen and examined on the day  of discharge and was found to be in stable condition. Time coordinating discharge: 25 minutes including assessment and coordination of care, as well as examination of the patient.   SIGNED:  Dessa Phi, DO [facility] [email or web address] 06/11/2018, 8:23 AM

## 2018-06-12 LAB — BASIC METABOLIC PANEL
Anion gap: 9 (ref 5–15)
BUN: 20 mg/dL (ref 8–23)
CO2: 25 mmol/L (ref 22–32)
Calcium: 9.1 mg/dL (ref 8.9–10.3)
Chloride: 108 mmol/L (ref 98–111)
Creatinine, Ser: 1.08 mg/dL — ABNORMAL HIGH (ref 0.44–1.00)
GFR calc Af Amer: 57 mL/min — ABNORMAL LOW (ref >60)
GFR, EST NON AFRICAN AMERICAN: 49 mL/min — AB (ref >60)
Glucose, Bld: 214 mg/dL — ABNORMAL HIGH (ref 70–99)
Potassium: 3.8 mmol/L (ref 3.5–5.1)
Sodium: 142 mmol/L (ref 135–145)

## 2018-06-12 LAB — GLUCOSE, CAPILLARY
GLUCOSE-CAPILLARY: 187 mg/dL — AB (ref 70–99)
Glucose-Capillary: 174 mg/dL — ABNORMAL HIGH (ref 70–99)
Glucose-Capillary: 213 mg/dL — ABNORMAL HIGH (ref 70–99)

## 2018-06-12 NOTE — Progress Notes (Signed)
Physical Therapy Treatment Patient Details Name: [name] MRN: [id number] DOB: [birth year] Today's Date: 06/12/2018    History of Present Illness 78 y.o. female with medical history significant for IBS, bronchiectasis, depression, anxiety, insomnia, hypertension, and type 2 diabetes mellitus, now presenting to the emergency department for evaluation of diarrhea and low blood pressure. Dx with AKI and admitted 06/09/18.    PT Comments    Patient progressing well towards PT goals. Improved ambulation distance today with min guard assist for balance/safety. VSS with HR in low 100s and Sp02 >93% on RA, 2-3/4 DOE. Pt concerned about core strength reporting she has a had time standing for prolonged periods. Instructed pt on core exercise to perform here and at home to improve overall core strength/stability. Discussed stairs at home as pt reports she uses RW to go up 2 small steps and reports no issues. Will follow and progress as tolerated.    Follow Up Recommendations  Home health PT;Supervision - Intermittent     Equipment Recommendations  None recommended by PT    Recommendations for Other Services       Precautions / Restrictions Precautions Precautions: Fall Precaution Comments: hx of falls Restrictions Weight Bearing Restrictions: No    Mobility  Bed Mobility               General bed mobility comments: Up in chair upon PT arrival.  Transfers Overall transfer level: Needs assistance Equipment used: Rolling walker (2 wheeled) Transfers: Sit to/from Stand Sit to Stand: Min guard         General transfer comment: Min guard for safety. Stood from Youth worker.   Ambulation/Gait Ambulation/Gait assistance: Min guard Gait Distance (Feet): 200 Feet Assistive device: Rolling walker (2 wheeled) Gait Pattern/deviations: Step-through pattern;Decreased stride length;Trunk flexed Gait velocity: decreased   General Gait Details: Slow, mostly steady gait with RW for  support; no knee buckling noted. HR up to 104 bpm, Sp02 93% on RA. 2/4 DOE.    Stairs             Wheelchair Mobility    Modified Rankin (Stroke Patients Only)       Balance Overall balance assessment: Mild deficits observed, not formally tested;Needs assistance Sitting-balance support: Feet supported;No upper extremity supported Sitting balance-Leahy Scale: Fair Sitting balance - Comments: Tired quickly sitting in chair with no back support; doing there ex sitting in chair without difficulty.    Standing balance support: During functional activity Standing balance-Leahy Scale: Poor Standing balance comment: Requires UE support for standing, able to wash hands at sink with trunk instability resulting in need to flex and rest on counter.                            Cognition Arousal/Alertness: Awake/alert Behavior During Therapy: WFL for tasks assessed/performed Overall Cognitive Status: Within Functional Limits for tasks assessed                                        Exercises Other Exercises Other Exercises: Worked on sitting upright in chair without back rest for duration of commericals and relax once show returns to work on core strength/stability.    General Comments General comments (skin integrity, edema, etc.): Complaining of weakened core esp with prolonged standing. Instructed pt in exercise.      Pertinent Vitals/Pain Pain Assessment: No/denies pain  Home Living                      Prior Function            PT Goals (current goals can now be found in the care plan section) Progress towards PT goals: Progressing toward goals    Frequency    Min 3X/week      PT Plan Current plan remains appropriate    Co-evaluation              AM-PAC PT "6 Clicks" Mobility   Outcome Measure  Help needed turning from your back to your side while in a flat bed without using bedrails?: A Little Help needed  moving from lying on your back to sitting on the side of a flat bed without using bedrails?: A Little Help needed moving to and from a bed to a chair (including a wheelchair)?: None Help needed standing up from a chair using your arms (e.g., wheelchair or bedside chair)?: None Help needed to walk in hospital room?: A Little Help needed climbing 3-5 steps with a railing? : A Little 6 Click Score: 20    End of Session Equipment Utilized During Treatment: Gait belt Activity Tolerance: Patient tolerated treatment well Patient left: in chair;with call bell/phone within reach Nurse Communication: Mobility status PT Visit Diagnosis: Muscle weakness (generalized) (M62.81);Difficulty in walking, not elsewhere classified (R26.2);Other symptoms and signs involving the nervous system (R29.898)     Time: 5597-4163 PT Time Calculation (min) (ACUTE ONLY): 15 min  Charges:  $Gait Training: 8-22 mins                     Wray Kearns, PT, DPT Acute Rehabilitation Services Pager [phone] Office [place] 06/12/2018, 9:09 AM

## 2018-06-12 NOTE — Consult Note (Signed)
   [place] Inpatient Consult   06/12/2018  JOURI THREAT 14-Apr-1941 [id number]  Patient screened for moderate risk score and hospitalizations to check if potential [place] [facility] services are needed as patient was declining home health care as recommended. Met with the patient at the bedside to assess for any [facility] Care Management needs.  Patient endorse Dr. [name] as her .Primary Care Provider. This physician office provides the transition of care follow up calls.   Explained [facility] Care Management services for post hospital follow up.  Patient states, "I don't want anyone coming out to my home.  It is just so cluttered and I would have any room for therapy to work with me and I am too sick to try and clean it up before anyone could come."  Patient states she would accept telephonic follow up calls.  Patient denies issues with medications or transportation.  Patient accepted [facility] for EMMI follow up calls.   Please place a [facility] Care Management consult or for questions contact:   Natividad Brood, RN BSN [place] [facility] Liaison  [phone] business mobile phone Toll free office [phone]

## 2018-06-12 NOTE — Progress Notes (Signed)
Pt is functioning modified independently in self care with RW. No OT needs.   06/12/18 1100  OT Visit Information  Last OT Received On 06/12/18  Assistance Needed +1  History of Present Illness 78 y.o. female with medical history significant for IBS, bronchiectasis, depression, anxiety, insomnia, hypertension, and type 2 diabetes mellitus, now presenting to the emergency department for evaluation of diarrhea and low blood pressure. Dx with AKI and admitted 06/09/18.  Precautions  Precautions Fall  Precaution Comments hx of falls  Restrictions  Weight Bearing Restrictions No  Home Living  Family/patient expects to be discharged to: Private residence  Living Arrangements Spouse/significant other  Available Help at Discharge Family;Available 24 hours/day  Type of Home House  Home Access Stairs to enter  Entrance Stairs-Number of Steps 2  Entrance Stairs-Rails None  Home Layout One level (sleeps in recliner)  Corporate investment banker Handicapped height  Home Equipment BSC;Walker - 2 wheels;Cane - single point;Tub bench;Grab bars - toilet;Hand held shower head;Transport chair  Prior Function  Level of Independence Independent with assistive device(s)  Comments pt reports using RW or cane for ambulation and ascent/descent of 2 steps into home. Pt utilizes a BSC and sleeps in a recliner, but is able to get into tub and utilize a tub bench to bathe and is independent in dressing  Communication  Communication No difficulties  Pain Assessment  Pain Assessment No/denies pain  Cognition  Arousal/Alertness Awake/alert  Behavior During Therapy WFL for tasks assessed/performed  Overall Cognitive Status Within Functional Limits for tasks assessed  Upper Extremity Assessment  LUE Deficits / Details L shoulder replacement, AROM WFL, strength grossly 3+/5, elbow, wrist and hand WFL   LUE Sensation decreased light touch;history of peripheral neuropathy (arthr)  Lower  Extremity Assessment  Lower Extremity Assessment Defer to PT evaluation  Cervical / Trunk Assessment  Cervical / Trunk Assessment Kyphotic  ADL  Overall ADL's  At baseline  Vision- History  Patient Visual Report No change from baseline  Bed Mobility  General bed mobility comments Up in chair upon arrival.  Transfers  Overall transfer level Modified independent  Equipment used None  General transfer comment transferred to [facility] modified independently  Balance  Overall balance assessment Mild deficits observed, not formally tested;Needs assistance  Sitting balance-Leahy Scale Good  Standing balance-Leahy Scale Poor  Standing balance comment Requires UE support for standing, able to wash hands at sink with trunk instability resulting in need to flex and rest on counter.  OT - End of Session  Activity Tolerance Patient tolerated treatment well  Patient left in chair;with call bell/phone within reach  OT Assessment  OT Recommendation/Assessment  (pt is declining further OT)  OT Visit Diagnosis Unsteadiness on feet (R26.81)  AM-PAC OT "6 Clicks" Daily Activity Outcome Measure (Version 2)  Help from another person eating meals? 4  Help from another person taking care of personal grooming? 4  Help from another person toileting, which includes using toliet, bedpan, or urinal? 4  Help from another person bathing (including washing, rinsing, drying)? 4  Help from another person to put on and taking off regular upper body clothing? 4  Help from another person to put on and taking off regular lower body clothing? 4  6 Click Score 24  OT Recommendation  Follow Up Recommendations No OT follow up;Supervision - Intermittent  OT Equipment 3 in 1 bedside commode  Acute Rehab OT Goals  Patient Stated Goal go home  OT Time Calculation  OT  Start Time (ACUTE ONLY) 1100  OT Stop Time (ACUTE ONLY) 1122  OT Time Calculation (min) 22 min  OT General Charges  $OT Visit 1 Visit  OT Evaluation  $OT  Eval Moderate Complexity 1 Mod  Written Expression  Dominant Hand Right  Nestor Lewandowsky, OTR/L Acute Rehabilitation Services Pager: [phone] Office: [phone]

## 2018-06-12 NOTE — Progress Notes (Signed)
   Patient was set to be discharged home 06/11/18 but at time of discharge, patient had generalized weakness, concern raised for safety. PT evaluated and patient recommended for home health PT. This morning, patient remains stable for discharge home. She states she walked up and down the hallway already this morning with PT. Had 2 normal BM.    Dessa Phi, DO [facility] [email or web address] 06/12/2018, 8:35 AM

## 2018-06-12 NOTE — Care Management Note (Addendum)
Case Management Note  Patient Details  Name: [name] MRN: [id number] Date of Birth: [birth year]  Subjective/Objective:    AKI               Action/Plan: CM talked to patient at the bedside; she lives at home with spouse; PCP is Dr Rushie Chestnut; has private insurance with Medicare; pharmacy of choice is [facility] on [address]; patient is refusing all [place] services at this time; CM explained the importance/ benefits from [facility], pt politely refused; CM informed her that if she changed her mind, her PCP can make the arrangements from the office. Patient is requesting a 3:1- ordered as requested.  Expected Discharge Date:  06/12/18               Expected Discharge Plan:  [place]  Discharge planning Services  CM Consult  Choice offered to:  Patient  HH Arranged:  Patient Refused HH   Status of Service:  In process  Sherrilyn Rist [phone] 06/12/2018, 9:48 AM

## 2018-06-13 ENCOUNTER — Telehealth: Payer: Self-pay | Admitting: Family Medicine

## 2018-06-13 ENCOUNTER — Telehealth: Payer: Self-pay

## 2018-06-13 NOTE — Telephone Encounter (Signed)
See note  Copied from [place] [id number]. Topic: General - Other >> Jun 13, 2018  5:25 PM Yvette Rack wrote: Reason for CRM: Pt returned call to the office. Pt requests call back.

## 2018-06-13 NOTE — Telephone Encounter (Signed)
LM for patient to return call for TCM call. 

## 2018-06-14 ENCOUNTER — Telehealth: Payer: Self-pay | Admitting: Family Medicine

## 2018-06-14 NOTE — Telephone Encounter (Signed)
See note. Patient is actually scheduled for 2/13  Copied from [place] #[id number]. Topic: Appointment Scheduling - Scheduling Inquiry for Clinic >> Jun 14, 2018  2:45 PM Vernona Rieger wrote: Reason for CRM: patient would like to be seen sooner for her hospital fu , right now it is scheduled for 2/14 , please advise

## 2018-06-14 NOTE — Telephone Encounter (Signed)
See note

## 2018-06-14 NOTE — Telephone Encounter (Signed)
Per Chart Review:  Admit date: 06/09/2018 Discharge date: 06/12/2018  Admitted From: Home Disposition:  Home  Recommendations for Outpatient Follow-up:  1. Follow up with PCP in 1 week 2. Please obtain BMP in 1 week   Discharge Condition: Stable CODE STATUS: Full  Diet recommendation: Carb modified diet   Brief/Interim Summary: [name] V68 y.o.femalewith medical history significant forIBS, bronchiectasis, depression, anxiety, insomnia, hypertension, and type 2 diabetes mellitus, now presenting to the emergency department for evaluation of diarrhea and low blood pressure. Patient has frequent loose stools, takes WelChol and Bentyl at home, but has had 5 days of watery diarrhea with several episodes daily, saw her PCP for this today, was found to have SBP in the 80s, and was directed to the ED. There has not been any fevers, chills, or significant abdominal pain, but the patient developed nausea 5 days ago, had one episode of vomiting, and has since been experiencing roughly 5 episodes of watery diarrhea daily. No melena or hematochezia. Stool studies were performed on 06/06/2018, including C. difficile, and this was all negative.In the emergency department, her creatinine was elevated at 4.22 up from her baseline of 0.67. She was started on IV fluids and admitted for further observation and treatment.  Discharge Diagnoses:  AKI Prerenal in etiology due to diarrhea, volume loss as well as hypotension and concomitant lisinopril use Renal ultrasound unremarkable Improving with IV fluids Continue to monitor BMP, hold lisinopril  Diarrhea Seems to be resolved at this point Outpatient stool studies including C. difficile were negative  Hypotension Resolved, blood pressure stable this morning  Hold lisinopril  Type 2 diabetes Sliding-scale insulin while inpatient. Resume home meds on discharge.   Depression and anxiety Continue Prozac, Xanax as  needed  ________________________________________________________________  Per Telephone Call:  Transition Care Management Follow-up Telephone Call   Date discharged?  06/12/2018   How have you been since you were released from the hospital? Per patient's husband, patient is doing very well.  States "thanks to you all, she went to the hospital and is now feeling much better."  Patient states she is feeling "good".   Do you understand why you were in the hospital? yes   Do you understand the discharge instructions? yes   Where were you discharged to? Home   Items Reviewed:  Medications reviewed: yes  Allergies reviewed: yes  Dietary changes reviewed: yes  Referrals reviewed: yes   Functional Questionnaire:   Activities of Daily Living (ADLs):   She states they are independent in the following: ambulation, bathing and hygiene, feeding, continence, grooming, toileting and dressing.  Patient's husband had an ablation yesterday (06/13/2018) and will be discharged from the hospital today.  Patient is now helping to take care of him. States they require assistance with the following: N/A   Any transportation issues/concerns?: no   Any patient concerns? no   Confirmed importance and date/time of follow-up visits scheduled yes  Provider Appointment booked with Dr. Yong Channel on 06/22/2018 @ 4:20 pm  Confirmed with patient if condition begins to worsen call PCP or go to the ER.  Patient was given the office number and encouraged to call back with question or concerns.  : yes

## 2018-06-14 NOTE — Telephone Encounter (Signed)
Patient's husband, Clair Gulling (on [place]), returning call to [employer], requesting a call back.

## 2018-06-14 NOTE — Telephone Encounter (Signed)
Noted thanks °

## 2018-06-14 NOTE — Telephone Encounter (Signed)
Amber had called patient for a TCM call

## 2018-06-15 ENCOUNTER — Encounter: Payer: Self-pay | Admitting: Gastroenterology

## 2018-06-15 NOTE — Telephone Encounter (Signed)
Patient called back today and clinical staff said there were no opening so put patient on the wait list.

## 2018-06-19 ENCOUNTER — Encounter: Payer: Self-pay | Admitting: Family Medicine

## 2018-06-19 ENCOUNTER — Ambulatory Visit (INDEPENDENT_AMBULATORY_CARE_PROVIDER_SITE_OTHER): Payer: Medicare Other | Admitting: Family Medicine

## 2018-06-19 VITALS — BP 158/92 | HR 86 | Temp 98.5°F | Ht 72.0 in | Wt 268.2 lb

## 2018-06-19 DIAGNOSIS — E119 Type 2 diabetes mellitus without complications: Secondary | ICD-10-CM | POA: Diagnosis not present

## 2018-06-19 DIAGNOSIS — Z87448 Personal history of other diseases of urinary system: Secondary | ICD-10-CM | POA: Diagnosis not present

## 2018-06-19 DIAGNOSIS — J479 Bronchiectasis, uncomplicated: Secondary | ICD-10-CM

## 2018-06-19 DIAGNOSIS — R0602 Shortness of breath: Secondary | ICD-10-CM | POA: Diagnosis not present

## 2018-06-19 DIAGNOSIS — I9589 Other hypotension: Secondary | ICD-10-CM

## 2018-06-19 DIAGNOSIS — E861 Hypovolemia: Secondary | ICD-10-CM | POA: Diagnosis not present

## 2018-06-19 DIAGNOSIS — K580 Irritable bowel syndrome with diarrhea: Secondary | ICD-10-CM | POA: Diagnosis not present

## 2018-06-19 DIAGNOSIS — I10 Essential (primary) hypertension: Secondary | ICD-10-CM

## 2018-06-19 DIAGNOSIS — N179 Acute kidney failure, unspecified: Secondary | ICD-10-CM

## 2018-06-19 LAB — BASIC METABOLIC PANEL
BUN: 10 mg/dL (ref 6–23)
CALCIUM: 9.6 mg/dL (ref 8.4–10.5)
CO2: 33 mEq/L — ABNORMAL HIGH (ref 19–32)
Chloride: 102 mEq/L (ref 96–112)
Creatinine, Ser: 0.65 mg/dL (ref 0.40–1.20)
GFR: 88.06 mL/min (ref >60.00)
GLUCOSE: 79 mg/dL (ref 70–99)
Potassium: 4.4 mEq/L (ref 3.5–5.1)
Sodium: 143 mEq/L (ref 135–145)

## 2018-06-19 LAB — CBC
HCT: 39.1 % (ref 36.0–46.0)
Hemoglobin: 12.8 g/dL (ref 12.0–15.0)
MCHC: 32.8 g/dL (ref 30.0–36.0)
MCV: 94.1 fl (ref 78.0–100.0)
Platelets: 299 10*3/uL (ref 150.0–400.0)
RBC: 4.16 Mil/uL (ref 3.87–5.11)
RDW: 15 % (ref 11.5–15.5)
WBC: 12.6 10*3/uL — ABNORMAL HIGH (ref 4.0–10.5)

## 2018-06-19 NOTE — Patient Instructions (Addendum)
Lets keep appointment on 06/30/2018 for now- I want you to check your blood pressure at home (im hoping we can restart lisinopril once labs are back). If pressure within the week gets <140/90 lets push next visit out 3 months   Please stop by lab before you go If you do not have mychart- we will call you about results within 5 business days of [name] receiving them.  If you have mychart- we will send your results within 3 business days of [name] receiving them.  If abnormal or we want to clarify a result, we will call or mychart you to make sure you receive the message.  If you have questions or concerns or don't hear within 5-7 days, please send [name] a message or call us.

## 2018-06-19 NOTE — Progress Notes (Signed)
Phone [phone]   Subjective:  [name] is a 78 y.o. year old very pleasant female patient who presents for transitional care management and hospital follow up for acute kidney injury related to diarrhea. Patient was hospitalized from 06/09/2018 to 06/12/2018. A TCM phone call was completed on June 14, 2018. Medical complexity moderate.  I reviewed her hospital discharge summary and summarized results below.  Patient with a history of IBS, bronchiectasis, depression, anxiety, hypertension, diabetes presented to the emergency department directly from our clinic due to ongoing diarrhea, hypotension, inability to place IV in reasonable timeframe.  Patient at baseline takes WelChol and Bentyl but prior to hospitalization had 5 days of watery diarrhea.  In our office she was found to have systolic blood pressures into the 80s before being sent to the emergency room.  In the emergency room she was found to have a creatinine above 4 with her baseline close to of 0.7.  There was no blood or mucus in the stool.  She had stool studies done on an outpatient basis before hospitalization which were negative including for C. difficile.  In the emergency room she was started on IV fluids and admitted for further observation and management for AKI.  AKI was determined to be prerenal in etiology due to diarrhea and volume loss as well as hypotension and concurrent lisinopril use.  We reviewed again today that if she has diarrhea or inability to keep fluids down for even 24 hours that I would recommend stopping lisinopril-we had reviewed that at last office visit.  She also had a renal ultrasound which was reassuring.  Her kidney function improved with IV fluids.  Her lisinopril was held even upon discharge.  Fortunately her diarrhea resolved before being discharged.  Hypotension resolved with fluid repletion and holding lisinopril.  Unfortunately on an outpatient basis her hypertension has become poorly  controlled off the lisinopril.  Diabetes was managed on sliding scale insulin while hospitalized.  Her home medications were resumed at discharge.  She was continued on Prozac and Xanax as needed for depression and anxiety while hospitalized.  Today she states she is feeling better-continues to not have diarrhea.  She has noted swelling in her feet and some mild shortness of breath-that has been getting each day and she continues to seem to urinate a large volume.  Her weight is up 10 pounds from before being hospitalized.  She has had a lot of stress-husband with ablation on Tuesday- feet started swelling while being there for him.  Her discharge was delayed by day as she was very weak with standing up- PT was recommended and they gave her some exercises to do at home.  She does not want home health PT.  She does not want to come to PT sessions as of yet in the office- she states she will consider if she is not improving.  As mentioned above-blood pressure is poorly controlled off lisinopril today.  She is on her home atenolol already   See problem oriented charting as well ROS-patient complains of some fatigue upon discharge from hospital.  Also has some shortness of breath.  Has some edema which seems to be improving.  Past Medical History-  Patient Active Problem List   Diagnosis Date Noted  . 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    Priority: High  . Bronchiectasis ([place]) 10/18/2015    Priority: High  . Diabetes mellitus type II, controlled ([place]) 12/28/2006    Priority: High  . History of adenomatous polyp of colon  05/18/2017    Priority: Medium  . Aortic atherosclerosis ([place]) 02/16/2016    Priority: Medium  . Solitary pulmonary nodule 01/08/2016    Priority: Medium  . IBS (irritable bowel syndrome) 07/17/2014    Priority: Medium  . Depression with anxiety 01/03/2014    Priority: Medium  . Essential hypertension, benign 01/03/2014    Priority: Medium  . Primary open-angle glaucoma  08/02/2012    Priority: Medium  . Fatty liver 03/18/2009    Priority: Medium  . Hyperlipidemia 07/26/2007    Priority: Medium  . ANEMIA, B12 DEFICIENCY 12/29/2006    Priority: Medium  . RESTLESS LEG SYNDROME, MILD 12/28/2006    Priority: Medium  . OSTEOPOROSIS 12/12/2006    Priority: Medium  . History of total knee replacement, bilateral 11/14/2017    Priority: Low  . Arthritis of midfoot 03/03/2016    Priority: Low  . Hammertoes of both feet 03/03/2016    Priority: Low  . Posterior tibial tendon dysfunction 01/03/2014    Priority: Low  . Osteoarthritis, knee 01/03/2014    Priority: Low  . Glaucoma 01/03/2014    Priority: Low  . Obesity (BMI 30-39.9) 06/15/2013    Priority: Low  . Foot tendinitis 07/20/2010    Priority: Low  . INSOMNIA, CHRONIC 07/25/2009    Priority: Low  . GERD 12/25/2007    Priority: Low  . NEUROPATHY, IDIOPATHIC PERIPHERAL NEC 12/28/2006    Priority: Low  . BREAST CANCER, HX OF 12/12/2006    Priority: Low    Medications- reviewed and updated  A medical reconciliation was performed comparing current medicines to hospital discharge medications. Current Outpatient Medications  Medication Sig Dispense Refill  . ALPRAZolam (XANAX) 0.25 MG tablet Take 1 tablet (0.25 mg total) by mouth daily as needed for anxiety. 30 tablet 0  . amoxicillin (AMOXIL) 500 MG capsule Take 2,000 mg by mouth See admin instructions. Take 2,000 mg by mouth one hour prior to dental procedures, to "pre-treat"    . atenolol (TENORMIN) 25 MG tablet TAKE 1 TABLET BY MOUTH ONCE DAILY (Patient taking differently: Take 12.5 mg by mouth daily. ) 90 tablet 3  . Bacillus Coagulans-Inulin (ALIGN PREBIOTIC-PROBIOTIC PO) Take 1 capsule by mouth daily.     . budesonide-formoterol (SYMBICORT) 160-4.5 MCG/ACT inhaler Inhale 2 puffs into the lungs 2 (two) times daily.    . cetirizine (ZYRTEC) 10 MG tablet Take 10 mg by mouth daily.    . Cholecalciferol (VITAMIN D3) 2000 units capsule Take 2,000  Units by mouth daily.    . Coenzyme Q10 300 MG CAPS Take 300 mg by mouth daily.    . colesevelam (WELCHOL) 625 MG tablet TAKE 1 TABLET BY MOUTH THREE TIMES DAILY (Patient taking differently: Take 625 mg by mouth 3 (three) times daily. ) 90 tablet 2  . dicyclomine (BENTYL) 10 MG capsule TAKE 2 CAPSULES BY MOUTH 4 TIMES DAILY AS NEEDED FOR  SPASMS (Patient taking differently: Take 20 mg by mouth 4 (four) times daily. ) 120 capsule 0  . dorzolamide-timolol (COSOPT) 22.3-6.8 MG/ML ophthalmic solution Place 1 drop into both eyes 2 (two) times daily.     [name] ESTRING 2 MG vaginal ring INSERT 1 RING  INTO VAGINA EVERY 3 MONTHS (Patient taking differently: Place 2 mg vaginally every 3 (three) months. ) 1 each 3  . Flaxseed, Linseed, (FLAX SEEDS PO) Take 1,400 mg by mouth daily.    [name] FLUoxetine (PROZAC) 40 MG capsule TAKE 1 CAPSULE BY MOUTH ONCE DAILY. (Patient taking differently: Take 40  mg by mouth daily. ) 90 capsule 3  . glimepiride (AMARYL) 2 MG tablet Take 1 tablet (2 mg total) by mouth daily with breakfast. 90 tablet 3  . glucose blood (FREESTYLE LITE) test strip USE TO CHECK BLOOD SUGAR EVERY MORNING E11.9 100 each 3  . latanoprost (XALATAN) 0.005 % ophthalmic solution Place 1 drop into both eyes at bedtime.    . levalbuterol (XOPENEX) 0.63 MG/3ML nebulizer solution Take 3 mLs (0.63 mg total) by nebulization every 6 (six) hours as needed for wheezing or shortness of breath. 360 mL 2  . Methylcellulose, Laxative, (CITRUCEL PO) Take 3 tablets by mouth at bedtime.    . Misc Natural Products (GRAPE SEED COMPLEX) CAPS Take 1 capsule by mouth daily.    . Multiple Vitamin (MULTIVITAMIN WITH MINERALS) TABS tablet Take 1 tablet by mouth daily.    . Omega-3 Fatty Acids (FISH OIL) 1200 MG CAPS Take 1,200 mg by mouth daily.    . ondansetron (ZOFRAN) 4 MG tablet Take 1 tablet (4 mg total) by mouth 2 (two) times daily as needed for nausea or vomiting. 30 tablet 0  . Polyvinyl Alcohol-Povidone (REFRESH OP) Place 1  drop into both eyes daily as needed (dry eyes).     . pramipexole (MIRAPEX) 0.5 MG tablet TAKE 1 TABLET BY MOUTH AT BEDTIME (Patient taking differently: Take 0.5 mg by mouth at bedtime. ) 90 tablet 0  . Respiratory Therapy Supplies (FLUTTER) DEVI Use as directed 1 each 0  . Selenium 200 MCG CAPS Take 200 mcg by mouth daily.    . sucralfate (CARAFATE) 1 GM/10ML suspension Take 10 mLs (1 g total) by mouth 4 (four) times daily -  before meals and at bedtime. May take as needed 420 mL 0  . temazepam (RESTORIL) 15 MG capsule Take 2 capsules (30 mg total) by mouth at bedtime as needed for sleep. (Patient taking differently: Take 30 mg by mouth at bedtime. ) 60 capsule 5  . VICTOZA 18 MG/3ML SOPN INJECT 1.8 MG INTO THE SKIN DAILY (Patient taking differently: Inject 1.8 mg into the skin daily. ) 9 pen 6  . vitamin B-12 (CYANOCOBALAMIN) 1000 MCG tablet Take 1,000 mcg by mouth daily.     No current facility-administered medications for this visit.    Objective  Objective:  BP (!) 158/92   Pulse 86   Temp 98.5 F (36.9 C) (Oral)   Ht 6' (1.829 m)   Wt 268 lb 3.2 oz (121.7 kg)   LMP 05/10/1992   SpO2 93%   BMI 36.37 kg/m  Gen: NAD, resting comfortably CV: RRR no murmurs rubs or gallops Lungs: CTAB no crackles, wheeze, rhonchi Abdomen: soft/nontender/nondistended/normal bowel sounds. No rebound or guarding.  Obese Ext: Trace edema Skin: warm, dry Neuro: Walks with walker   Assessment and Plan:   Acute kidney injury-prerenal due to diarrhea/hypotension in addition to concurrent lisinopril use-this has fully resolved on blood work today with creatinine back to her baseline of around 0.7.  Also had some hypokalemia in the hospital which has since resolved.  We reviewed precautions around lisinopril if has more significant diarrhea or inability to keep fluids down in future.  Hypertension- we will resume her lisinopril and she will continue her atenolol-she is going to monitor at home and if not  back to less than 140/90 within the next week or so-she will return to see [name] on the 21st-if home blood pressures look excellent we can move this back perhaps 3 months  Diabetes-she can  resume her Victoza and Amaryl  Shortness of breath/edema-I think some of this is from fluid repletion in the hospital-she seems to be diuresing well at home.  She knows if she does not continue to improve from this perspective that she should see [name] back.  She does have baseline bronchiectasis which could contribute but I still believe slight overload of fluid is the primary issue here at present  IBS/diarrhea predominant-she can continue her home medications-thankful she has seen significant improvement.  Anxiety and depression-continued on Prozac and Xanax in the hospital-we will continue at home as well.   Future Appointments  Date Time Provider [place]  06/20/2018  3:00 PM [place] Sherrilyn Rist, [place] [facility] None  06/30/2018  4:00 PM Marin Olp, MD [facility]  07/04/2018  4:00 PM [place] Sherrilyn Rist, [name] [facility] None  07/18/2018  4:00 PM [place] Helene Kelp [facility] None  07/28/2018 12:45 PM Megan Salon, MD [place] None    Lab/Order associations: Essential hypertension, benign - Plan: CBC, Basic metabolic panel  AKI (acute kidney injury) ([place])  Bronchiectasis without complication ([place])  Controlled type 2 diabetes mellitus without complication, without long-term current use of insulin ([place])  Irritable bowel syndrome with diarrhea  Hypotension due to hypovolemia  Shortness of breath  eturn precautions advised.  [name], MD

## 2018-06-19 NOTE — Telephone Encounter (Signed)
Pt was scheduled. No further action needed

## 2018-06-20 ENCOUNTER — Ambulatory Visit: Payer: Medicare Other | Admitting: Psychology

## 2018-06-21 ENCOUNTER — Other Ambulatory Visit: Payer: Self-pay | Admitting: Gastroenterology

## 2018-06-22 ENCOUNTER — Inpatient Hospital Stay: Payer: Medicare Other | Admitting: Family Medicine

## 2018-06-22 ENCOUNTER — Encounter

## 2018-06-30 ENCOUNTER — Ambulatory Visit: Payer: Medicare Other | Admitting: Family Medicine

## 2018-07-03 ENCOUNTER — Other Ambulatory Visit: Payer: Self-pay | Admitting: Family Medicine

## 2018-07-04 ENCOUNTER — Ambulatory Visit: Payer: Medicare Other | Admitting: Psychology

## 2018-07-09 ENCOUNTER — Other Ambulatory Visit: Payer: Self-pay | Admitting: Gastroenterology

## 2018-07-18 ENCOUNTER — Ambulatory Visit (INDEPENDENT_AMBULATORY_CARE_PROVIDER_SITE_OTHER): Payer: Medicare Other | Admitting: Psychology

## 2018-07-18 DIAGNOSIS — F321 Major depressive disorder, single episode, moderate: Secondary | ICD-10-CM

## 2018-07-24 ENCOUNTER — Other Ambulatory Visit: Payer: Self-pay | Admitting: Family Medicine

## 2018-07-26 ENCOUNTER — Telehealth: Payer: Self-pay | Admitting: Obstetrics & Gynecology

## 2018-07-26 NOTE — Telephone Encounter (Signed)
Patient rescheduled her e string recheck to next month. She is not comfortable coming in right now.

## 2018-07-27 ENCOUNTER — Other Ambulatory Visit: Payer: Self-pay

## 2018-07-27 ENCOUNTER — Other Ambulatory Visit: Payer: Self-pay | Admitting: Family Medicine

## 2018-07-27 MED ORDER — TEMAZEPAM 15 MG PO CAPS: ORAL_CAPSULE | ORAL | 5 refills | Status: DC

## 2018-07-28 ENCOUNTER — Ambulatory Visit: Payer: Medicare Other | Admitting: Obstetrics & Gynecology

## 2018-08-07 ENCOUNTER — Ambulatory Visit: Payer: Medicare Other | Admitting: Psychology

## 2018-08-07 ENCOUNTER — Other Ambulatory Visit: Payer: Self-pay | Admitting: Gastroenterology

## 2018-08-14 ENCOUNTER — Other Ambulatory Visit: Payer: Self-pay | Admitting: Family Medicine

## 2018-08-24 ENCOUNTER — Ambulatory Visit: Payer: Medicare Other | Admitting: Obstetrics & Gynecology

## 2018-08-30 ENCOUNTER — Other Ambulatory Visit: Payer: Self-pay | Admitting: Gastroenterology

## 2018-08-31 ENCOUNTER — Encounter: Payer: Self-pay | Admitting: Family Medicine

## 2018-08-31 ENCOUNTER — Ambulatory Visit (INDEPENDENT_AMBULATORY_CARE_PROVIDER_SITE_OTHER): Payer: Medicare Other | Admitting: Family Medicine

## 2018-08-31 VITALS — Temp 98.6°F | Wt 242.0 lb

## 2018-08-31 DIAGNOSIS — I10 Essential (primary) hypertension: Secondary | ICD-10-CM | POA: Diagnosis not present

## 2018-08-31 DIAGNOSIS — R197 Diarrhea, unspecified: Secondary | ICD-10-CM | POA: Diagnosis not present

## 2018-08-31 DIAGNOSIS — E119 Type 2 diabetes mellitus without complications: Secondary | ICD-10-CM

## 2018-08-31 DIAGNOSIS — R112 Nausea with vomiting, unspecified: Secondary | ICD-10-CM

## 2018-08-31 MED ORDER — LISINOPRIL 10 MG PO TABS: 10.0000 mg | ORAL_TABLET | Freq: Every day | ORAL | 3 refills | Status: DC

## 2018-08-31 NOTE — Progress Notes (Signed)
Phone [phone]   Subjective:  Virtual visit via Video note. Chief complaint: Chief Complaint  Patient presents with  . Diarrhea    Sx started a week ago. Pt denies changing diets or trying anything new. Pt stated that she had a hx of this in Jan.   . Emesis    This visit type was conducted due to national recommendations for restrictions regarding the COVID-19 Pandemic (e.g. social distancing).  This format is felt to be most appropriate for this patient at this time balancing risks to patient and risks to population by having him in for in person visit.  No physical exam was performed (except for noted visual exam or audio findings with Telehealth visits).    Our team/I connected with [name] on 08/31/18 at  9:40 AM EDT by a video enabled telemedicine application ([email or web address]) and verified that I am speaking with the correct person using two identifiers.  Location patient: [facility] Location provider: [facility], office Persons participating in the virtual visit:  patient  Our team/I discussed the limitations of evaluation and management by telemedicine and the availability of in person appointments. In light of current covid-19 pandemic, patient also understands that we are trying to protect them by minimizing in office contact if at all possible.  The patient expressed consent for telemedicine visit and agreed to proceed. Patient understands insurance will be billed.   ROS- no fever/chills. Has had diarrhea/nausea/vomiting. No blood or bile obvious in emesis.    Past Medical History-  Patient Active Problem List   Diagnosis Date Noted  . 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    Priority: High  . Bronchiectasis ([place]) 10/18/2015    Priority: High  . Diabetes mellitus type II, controlled ([place]) 12/28/2006    Priority: High  . History of adenomatous polyp of colon 05/18/2017    Priority: Medium  . Aortic atherosclerosis ([place]) 02/16/2016    Priority: Medium  . Solitary pulmonary nodule  01/08/2016    Priority: Medium  . IBS (irritable bowel syndrome) 07/17/2014    Priority: Medium  . Depression with anxiety 01/03/2014    Priority: Medium  . Essential hypertension, benign 01/03/2014    Priority: Medium  . Primary open-angle glaucoma 08/02/2012    Priority: Medium  . Fatty liver 03/18/2009    Priority: Medium  . Hyperlipidemia 07/26/2007    Priority: Medium  . ANEMIA, B12 DEFICIENCY 12/29/2006    Priority: Medium  . RESTLESS LEG SYNDROME, MILD 12/28/2006    Priority: Medium  . OSTEOPOROSIS 12/12/2006    Priority: Medium  . History of total knee replacement, bilateral 11/14/2017    Priority: Low  . Arthritis of midfoot 03/03/2016    Priority: Low  . Hammertoes of both feet 03/03/2016    Priority: Low  . Posterior tibial tendon dysfunction 01/03/2014    Priority: Low  . Osteoarthritis, knee 01/03/2014    Priority: Low  . Glaucoma 01/03/2014    Priority: Low  . Obesity (BMI 30-39.9) 06/15/2013    Priority: Low  . Foot tendinitis 07/20/2010    Priority: Low  . INSOMNIA, CHRONIC 07/25/2009    Priority: Low  . GERD 12/25/2007    Priority: Low  . NEUROPATHY, IDIOPATHIC PERIPHERAL NEC 12/28/2006    Priority: Low  . BREAST CANCER, HX OF 12/12/2006    Priority: Low    Medications- reviewed and updated Current Outpatient Medications  Medication Sig Dispense Refill  . ALPRAZolam (XANAX) 0.25 MG tablet Take 1 tablet (0.25 mg total) by mouth daily as needed  for anxiety. 30 tablet 0  . amoxicillin (AMOXIL) 500 MG capsule Take 2,000 mg by mouth See admin instructions. Take 2,000 mg by mouth one hour prior to dental procedures, to "pre-treat"    . atenolol (TENORMIN) 25 MG tablet TAKE 1 TABLET BY MOUTH ONCE DAILY (Patient taking differently: Take 12.5 mg by mouth daily. ) 90 tablet 3  . Bacillus Coagulans-Inulin (ALIGN PREBIOTIC-PROBIOTIC PO) Take 1 capsule by mouth daily.     . budesonide-formoterol (SYMBICORT) 160-4.5 MCG/ACT inhaler Inhale 2 puffs into the lungs  2 (two) times daily.    . cetirizine (ZYRTEC) 10 MG tablet Take 10 mg by mouth daily.    . Cholecalciferol (VITAMIN D3) 2000 units capsule Take 2,000 Units by mouth daily.    . Coenzyme Q10 300 MG CAPS Take 300 mg by mouth daily.    . colesevelam (WELCHOL) 625 MG tablet TAKE 1 TABLET BY MOUTH THREE TIMES DAILY 90 tablet 0  . dicyclomine (BENTYL) 10 MG capsule TAKE 2 CAPSULES BY MOUTH 4 TIMES DAILY AS NEEDED FOR SPASMS 120 capsule 0  . dorzolamide-timolol (COSOPT) 22.3-6.8 MG/ML ophthalmic solution Place 1 drop into both eyes 2 (two) times daily.     [name] ESTRING 2 MG vaginal ring INSERT 1 RING  INTO VAGINA EVERY 3 MONTHS (Patient taking differently: Place 2 mg vaginally every 3 (three) months. ) 1 each 3  . Flaxseed, Linseed, (FLAX SEEDS PO) Take 1,400 mg by mouth daily.    [name] FLUoxetine (PROZAC) 40 MG capsule TAKE 1 CAPSULE BY MOUTH ONCE DAILY. (Patient taking differently: Take 40 mg by mouth daily. ) 90 capsule 3  . glimepiride (AMARYL) 4 MG tablet TAKE 1 TABLET BY MOUTH ONCE DAILY WITH BREAKFAST 90 tablet 0  . glucose blood (FREESTYLE LITE) test strip USE TO CHECK BLOOD SUGAR EVERY MORNING E11.9 100 each 3  . latanoprost (XALATAN) 0.005 % ophthalmic solution Place 1 drop into both eyes at bedtime.    . levalbuterol (XOPENEX) 0.63 MG/3ML nebulizer solution Take 3 mLs (0.63 mg total) by nebulization every 6 (six) hours as needed for wheezing or shortness of breath. 360 mL 2  . Methylcellulose, Laxative, (CITRUCEL PO) Take 3 tablets by mouth at bedtime.    . Misc Natural Products (GRAPE SEED COMPLEX) CAPS Take 1 capsule by mouth daily.    . Multiple Vitamin (MULTIVITAMIN WITH MINERALS) TABS tablet Take 1 tablet by mouth daily.    . Omega-3 Fatty Acids (FISH OIL) 1200 MG CAPS Take 1,200 mg by mouth daily.    . ondansetron (ZOFRAN) 4 MG tablet Take 1 tablet (4 mg total) by mouth 2 (two) times daily as needed for nausea or vomiting. 30 tablet 0  . Polyvinyl Alcohol-Povidone (REFRESH OP) Place 1 drop into  both eyes daily as needed (dry eyes).     . pramipexole (MIRAPEX) 0.5 MG tablet TAKE 1 TABLET BY MOUTH AT BEDTIME 90 tablet 0  . Respiratory Therapy Supplies (FLUTTER) DEVI Use as directed 1 each 0  . Selenium 200 MCG CAPS Take 200 mcg by mouth daily.    . sucralfate (CARAFATE) 1 GM/10ML suspension Take 10 mLs (1 g total) by mouth 4 (four) times daily -  before meals and at bedtime. May take as needed 420 mL 0  . temazepam (RESTORIL) 15 MG capsule TAKE 2 CAPSULES BY MOUTH AT BEDTIME AS NEEDED FOR  SLEEP 60 capsule 5  . VICTOZA 18 MG/3ML SOPN INJECT 1.8MG  INTO THE SKIN DAILY 9 mL 0  . vitamin B-12 (CYANOCOBALAMIN)  1000 MCG tablet Take 1,000 mcg by mouth daily.    [name] lisinopril (ZESTRIL) 10 MG tablet Take 1 tablet (10 mg total) by mouth daily. NO PRINT ORDER- to add back to medication list 90 tablet 3   No current facility-administered medications for this visit.      Objective:  Temp 98.6 F (37 C)   Wt 242 lb (109.8 kg)   LMP 05/10/1992   BMI 32.82 kg/m . Cant locate her bp cuff.  Gen: NAD, appears fatigued Lungs: nonlabored, normal respiratory rate  Nondistended abdomen but is overweight Skin: appears dry, no obvious rash     Assessment and Plan   # Diarrhea S:symptoms started Monday morning. Feels like getting better then gets worse. Night before last threw up 2-3x.  She has not taken lisinopril or atenolol since Sunday night (last dose). Having 2-3 episodes of diarrhea per day- started out loose and now watery.   Was able to eat bowl of cereal today. Has been able to drink fair amount of fluid. This is the first day she has not had loose stools.   Weight is 242- that's pretty stable- last weight here was with prosthesis and she usually weighs without. No increased shortness of breath or swelling. No cough or fever. No lightheadedness.  A/P: Unfortunately seems like patient has a viral gastroenteritis (or could be flare up of IBS though vomiting not typical) similar to the one she  had a few months ago that led to significant AKI-this episode seems less severe and she has also held lisinopril earlier in the course so I think she will respond better.  We will try to slow down diarrhea with Imodium.  Recommended she drink Gatorade as well.  I recommended a follow-up on Monday-if she is not continuing to improve would force our hand and doing blood work-she wants to not come in at the present time due to COVID-19-in addition we have inadequate PPE-for GI symptoms we typically would use PPE given potential overlap with COVID-19.  We did discuss COVID-19 camera initially present with GI symptoms and asked her to watch out for cough or fever  #diabetes S: a1c has been well controlled on amaryl and victoza. Since becoming ill- Has been taking victoza but holding amaryl.Doesn't feel she needs nausea medicine Lab Results  Component Value Date   HGBA1C 5.6 12/29/2017  A/P: gatorade may not be ideal for diabetes but we want to keep her hydrated and electrolytes balance-recommended she start this.  Her A1c has been well enough controlled that I think she can tolerate being off Victoza and Amaryl short-term. -  Is going to hold off on victoza until symptoms resolve.  She was already holding Amaryl which I think is reasonable to help avoid hypoglycemia.  We discussed Victoza can cause GI discomfort so wanted to hold for now.  #Hypertension S: Has been controlled on atenolol and lisinopril 10 mg A/P: She was unable to locate a blood pressure cuff today-she is going to try to find this at home and let us know if blood pressure is elevated-I agree we should hold atenolol and lisinopril for now given potential for hypotension and prior acute kidney injury.  We do need to update her A1c-she has a May visit-possibility of drive up lab testing given moderate risk for covid-19  Future Appointments  Date Time Provider [place]  09/04/2018 11:20 AM Marin Olp, MD [facility]   09/18/2018  3:40 PM Yong Channel Brayton Mars, MD [facility]  09/22/2018  3:45 PM Megan Salon, MD [facility] None   Lab/Order associations: Diarrhea of presumed infectious origin  Nausea and vomiting, intractability of vomiting not specified, unspecified vomiting type  Controlled type 2 diabetes mellitus without complication, without long-term current use of insulin ([place])  Essential hypertension, benign  Meds ordered this encounter  Medications  . lisinopril (ZESTRIL) 10 MG tablet    Sig: Take 1 tablet (10 mg total) by mouth daily. NO PRINT ORDER- to add back to medication list    Dispense:  90 tablet    Refill:  3    Return precautions advised.  [name], MD

## 2018-08-31 NOTE — Patient Instructions (Addendum)
Health Maintenance Due  Topic Date Due  . HEMOGLOBIN A1C-at visit in May 07/01/2018     Video visit

## 2018-09-04 ENCOUNTER — Ambulatory Visit: Payer: Medicare Other | Admitting: Family Medicine

## 2018-09-07 ENCOUNTER — Telehealth: Payer: Self-pay | Admitting: Obstetrics & Gynecology

## 2018-09-07 NOTE — Telephone Encounter (Signed)
Patient canceled 3 month e-string appointment 09/22/18 and will call later to reschedule.

## 2018-09-08 ENCOUNTER — Other Ambulatory Visit: Payer: Self-pay | Admitting: Family Medicine

## 2018-09-09 ENCOUNTER — Other Ambulatory Visit: Payer: Self-pay | Admitting: Gastroenterology

## 2018-09-11 ENCOUNTER — Telehealth: Payer: Self-pay | Admitting: Pulmonary Disease

## 2018-09-11 MED ORDER — AMOXICILLIN-POT CLAVULANATE 875-125 MG PO TABS: 1.0000 | ORAL_TABLET | Freq: Two times a day (BID) | ORAL | 0 refills | Status: DC

## 2018-09-11 NOTE — Telephone Encounter (Signed)
Primary Pulmonologist: BQ Last office visit and with whom: 04/11/18 with Wyn Quaker What do we see them for (pulmonary problems): bronchiectasis/pulmonary nodule Last OV assessment/plan: Instructions  Return in about 4 weeks (around 05/09/2018), or if symptoms worsen or fail to improve, for Follow up with Dr. Lake Bells, Follow up with Wyn Quaker FNP-C.  Duoneb today   Chest x-ray today >>>go to [facility] office in the basement  Augmentin >>> Take 1 875-125 mg tablet every 12 hours for the next 7 days >>> Take with food  Prednisone 10mg  tablet  >>>Take 2 tablets (20 mg total) daily for the next 5 days >>> Take with food in the morning  Continue Symbicort 160 >>> 2 puffs in the morning right when you wake up, rinse out your mouth after use, 12 hours later 2 puffs, rinse after use >>> Take this daily, no matter what >>> This is not a rescue inhaler   Bronchiectasis: This is the medical term which indicates that you have damage, dilated airways making you more susceptible to respiratory infection. Use a flutter valve 10 breaths twice a day or 4 to 5 breaths 4-5 times a day to help clear mucus out Let us know if you have cough with change in mucus color or fevers or chills.  At that point you would need an antibiotic. Maintain a healthy nutritious diet, eating whole foods Take your medications as prescribed   Nebulizer ordered  >>> Xopenex breathing treatments can use every 6 hours as needed for shortness of breath and wheezing    Follow up in 4 weeks to ensure symptoms are improving        Was appointment offered to patient (explain)?  Pt requesting to have meds prescribed   Reason for call: called and spoke with pt who stated she has been coughing x5 days which began as a head cold but now states she feels like it has moved to her her chest. Pt states she is mainly coughing up clear mucus but will occ cough up yellow mucus. Pt states she does have SOB with exertion. Pt denies  any chest tightness.  Pt denies any complaints of fever, body aches, or chills, has not done any recent travel and has not been around anyone that has been sick..   Asked pt if she has taken any OTC meds to help with her cough and pt stated she has not taken anything. Pt has not had to use her nebulizer any and states she is using her symbicort inhaler daily as prescribed.  Pt is requesting to have something called in to help with her cough. Since pt last saw Aaron Edelman, sending to him to review. Aaron Edelman, please advise on this for pt. Thanks!

## 2018-09-11 NOTE — Telephone Encounter (Signed)
Called and spoke with patient regarding [name] recommendations below Pt verbalized and expressed understanding Placed Augmentin 875-125mg  every 12hrs for next 7 days to pharmacy Nothing further needed

## 2018-09-11 NOTE — Telephone Encounter (Signed)
Can offer:  Augmentin >>> Take 1 875-125 mg tablet every 12 hours for the next 7 days >>> Take with food  Please place the order  Please make sure the patient has a scheduled follow-up with Dr. Lake Bells or a 2 to 49-month recall to get scheduled with Dr. Lake Bells.  Wyn Quaker, FNP

## 2018-09-18 ENCOUNTER — Encounter: Payer: Self-pay | Admitting: Family Medicine

## 2018-09-18 ENCOUNTER — Ambulatory Visit (INDEPENDENT_AMBULATORY_CARE_PROVIDER_SITE_OTHER): Payer: Medicare Other | Admitting: Family Medicine

## 2018-09-18 VITALS — Ht 72.0 in | Wt 242.0 lb

## 2018-09-18 DIAGNOSIS — F3342 Major depressive disorder, recurrent, in full remission: Secondary | ICD-10-CM

## 2018-09-18 DIAGNOSIS — E1169 Type 2 diabetes mellitus with other specified complication: Secondary | ICD-10-CM

## 2018-09-18 DIAGNOSIS — E119 Type 2 diabetes mellitus without complications: Secondary | ICD-10-CM | POA: Diagnosis not present

## 2018-09-18 DIAGNOSIS — I10 Essential (primary) hypertension: Secondary | ICD-10-CM

## 2018-09-18 DIAGNOSIS — E785 Hyperlipidemia, unspecified: Secondary | ICD-10-CM | POA: Diagnosis not present

## 2018-09-18 NOTE — Assessment & Plan Note (Signed)
S: Last lipids were several years ago- we discussed new evidence on efficacy of statins in diabetics A/P: I told patient we would update lipids but would likely strongly recommend at least lovastatin low-dose once a week-she agrees to consider but seems to lean heavily towards declining this

## 2018-09-18 NOTE — Patient Instructions (Addendum)
Health Maintenance Due  Topic Date Due  . HEMOGLOBIN A1C Will be done with labs 07/01/2018  . OPHTHALMOLOGY EXAM Pt stated her appt is in May. Please sign a ROI to have your results faxed to our office 09/03/2018    Depression screen [facility] 2/9 08/31/2018 01/19/2018 12/29/2017  Decreased Interest 0 1 1  Down, Depressed, Hopeless 0 3 2  PHQ - 2 Score 0 4 3  Altered sleeping 0 2 0  Tired, decreased energy 3 2 3   Change in appetite 0 1 0  Feeling bad or failure about yourself  0 0 0  Trouble concentrating 0 0 0  Moving slowly or fidgety/restless 0 0 0  Suicidal thoughts 0 0 0  PHQ-9 Score 3 9 6   Difficult doing work/chores Not difficult at all Somewhat difficult Very difficult  Some recent data might be hidden   Video visit

## 2018-09-18 NOTE — Progress Notes (Signed)
Phone [phone]   Subjective:  Virtual visit via Video note. Chief complaint: Chief Complaint  Patient presents with   Diabetes    pt stated that her glucose has been 122-190.     This visit type was conducted due to national recommendations for restrictions regarding the COVID-19 Pandemic (e.g. social distancing).  This format is felt to be most appropriate for this patient at this time balancing risks to patient and risks to population by having him in for in person visit.  No physical exam was performed (except for noted visual exam or audio findings with Telehealth visits).    Our team/I connected with [name] on 09/18/18 at  3:40 PM EDT by a video enabled telemedicine application ([email or web address]) and verified that I am speaking with the correct person using two identifiers.  Location patient: [facility] Location provider: [facility], office Persons participating in the virtual visit:  patient  Our team/I discussed the limitations of evaluation and management by telemedicine and the availability of in person appointments. In light of current covid-19 pandemic, patient also understands that we are trying to protect them by minimizing in office contact if at all possible.  The patient expressed consent for telemedicine visit and agreed to proceed. Patient understands insurance will be billed.   ROS- still with some shortness of breath. No fever/chills cough/sore throat.    Past Medical History-  Patient Active Problem List   Diagnosis Date Noted   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    Priority: High   Bronchiectasis ([place]) 10/18/2015    Priority: High   Diabetes mellitus type II, controlled ([place]) 12/28/2006    Priority: High   History of adenomatous polyp of colon 05/18/2017    Priority: Medium   Aortic atherosclerosis ([place]) 02/16/2016    Priority: Medium   Solitary pulmonary nodule 01/08/2016    Priority: Medium   IBS (irritable bowel syndrome) 07/17/2014    Priority:  Medium   Recurrent major depression in full remission ([place]) 01/03/2014    Priority: Medium   Essential hypertension, benign 01/03/2014    Priority: Medium   Primary open-angle glaucoma 08/02/2012    Priority: Medium   Fatty liver 03/18/2009    Priority: Medium   Hyperlipidemia associated with type 2 diabetes mellitus ([place]) 07/26/2007    Priority: Medium   ANEMIA, B12 DEFICIENCY 12/29/2006    Priority: Medium   RESTLESS LEG SYNDROME, MILD 12/28/2006    Priority: Medium   OSTEOPOROSIS 12/12/2006    Priority: Medium   History of total knee replacement, bilateral 11/14/2017    Priority: Low   Arthritis of midfoot 03/03/2016    Priority: Low   Hammertoes of both feet 03/03/2016    Priority: Low   Posterior tibial tendon dysfunction 01/03/2014    Priority: Low   Osteoarthritis, knee 01/03/2014    Priority: Low   Glaucoma 01/03/2014    Priority: Low   Obesity (BMI 30-39.9) 06/15/2013    Priority: Low   Foot tendinitis 07/20/2010    Priority: Low   INSOMNIA, CHRONIC 07/25/2009    Priority: Low   GERD 12/25/2007    Priority: Low   NEUROPATHY, IDIOPATHIC PERIPHERAL NEC 12/28/2006    Priority: Low   BREAST CANCER, HX OF 12/12/2006    Priority: Low    Medications- reviewed and updated Current Outpatient Medications  Medication Sig Dispense Refill   ALPRAZolam (XANAX) 0.25 MG tablet Take 1 tablet (0.25 mg total) by mouth daily as needed for anxiety. 30 tablet 0   amoxicillin (AMOXIL) 500  MG capsule Take 2,000 mg by mouth See admin instructions. Take 2,000 mg by mouth one hour prior to dental procedures, to "pre-treat"     atenolol (TENORMIN) 25 MG tablet TAKE 1 TABLET BY MOUTH ONCE DAILY (Patient taking differently: Take 12.5 mg by mouth daily. ) 90 tablet 3   Bacillus Coagulans-Inulin (ALIGN PREBIOTIC-PROBIOTIC PO) Take 1 capsule by mouth daily.      budesonide-formoterol (SYMBICORT) 160-4.5 MCG/ACT inhaler Inhale 2 puffs into the lungs 2 (two) times  daily.     cetirizine (ZYRTEC) 10 MG tablet Take 10 mg by mouth daily.     Cholecalciferol (VITAMIN D3) 2000 units capsule Take 2,000 Units by mouth daily.     Coenzyme Q10 300 MG CAPS Take 300 mg by mouth daily.     colesevelam (WELCHOL) 625 MG tablet TAKE 1 TABLET BY MOUTH THREE TIMES DAILY 90 tablet 0   dicyclomine (BENTYL) 10 MG capsule TAKE 2 CAPSULES BY MOUTH 4 TIMES DAILY AS NEEDED FOR  SPASMS 120 capsule 0   dorzolamide-timolol (COSOPT) 22.3-6.8 MG/ML ophthalmic solution Place 1 drop into both eyes 2 (two) times daily.      ESTRING 2 MG vaginal ring INSERT 1 RING  INTO VAGINA EVERY 3 MONTHS (Patient taking differently: Place 2 mg vaginally every 3 (three) months. ) 1 each 3   Flaxseed, Linseed, (FLAX SEEDS PO) Take 1,400 mg by mouth daily.     FLUoxetine (PROZAC) 40 MG capsule TAKE 1 CAPSULE BY MOUTH ONCE DAILY. (Patient taking differently: Take 40 mg by mouth daily. ) 90 capsule 3   glimepiride (AMARYL) 4 MG tablet TAKE 1 TABLET BY MOUTH ONCE DAILY WITH BREAKFAST 90 tablet 0   glucose blood (FREESTYLE LITE) test strip USE TO CHECK BLOOD SUGAR EVERY MORNING E11.9 100 each 3   latanoprost (XALATAN) 0.005 % ophthalmic solution Place 1 drop into both eyes at bedtime.     levalbuterol (XOPENEX) 0.63 MG/3ML nebulizer solution Take 3 mLs (0.63 mg total) by nebulization every 6 (six) hours as needed for wheezing or shortness of breath. 360 mL 2   lisinopril (ZESTRIL) 10 MG tablet Take 1 tablet (10 mg total) by mouth daily. NO PRINT ORDER- to add back to medication list 90 tablet 3   Methylcellulose, Laxative, (CITRUCEL PO) Take 3 tablets by mouth at bedtime.     Misc Natural Products (GRAPE SEED COMPLEX) CAPS Take 1 capsule by mouth daily.     Multiple Vitamin (MULTIVITAMIN WITH MINERALS) TABS tablet Take 1 tablet by mouth daily.     Omega-3 Fatty Acids (FISH OIL) 1200 MG CAPS Take 1,200 mg by mouth daily.     ondansetron (ZOFRAN) 4 MG tablet Take 1 tablet (4 mg total) by mouth  2 (two) times daily as needed for nausea or vomiting. 30 tablet 0   Polyvinyl Alcohol-Povidone (REFRESH OP) Place 1 drop into both eyes daily as needed (dry eyes).      pramipexole (MIRAPEX) 0.5 MG tablet TAKE 1 TABLET BY MOUTH AT BEDTIME 90 tablet 0   Respiratory Therapy Supplies (FLUTTER) DEVI Use as directed 1 each 0   Selenium 200 MCG CAPS Take 200 mcg by mouth daily.     sucralfate (CARAFATE) 1 GM/10ML suspension Take 10 mLs (1 g total) by mouth 4 (four) times daily -  before meals and at bedtime. May take as needed 420 mL 0   temazepam (RESTORIL) 15 MG capsule TAKE 2 CAPSULES BY MOUTH AT BEDTIME AS NEEDED FOR  SLEEP 60 capsule 5  VICTOZA 18 MG/3ML SOPN INJECT 1.8MG  INTO THE SKIN DAILY 9 mL 0   vitamin B-12 (CYANOCOBALAMIN) 1000 MCG tablet Take 1,000 mcg by mouth daily.     budesonide-formoterol (SYMBICORT) 160-4.5 MCG/ACT inhaler Symbicort 160 mcg-4.5 mcg/actuation HFA aerosol inhaler  INHALE 2 PUFFS BY MOUTH TWICE DAILY     hydrocortisone 2.5 % cream      No current facility-administered medications for this visit.      Objective:  Ht 6' (1.829 m)    Wt 242 lb (109.8 kg)    LMP 05/10/1992    BMI 32.82 kg/m  self reported vitals Gen: NAD, resting comfortably Lungs: nonlabored, normal respiratory rate  Skin: appears dry, no obvious rash    Assessment and Plan   # Diabetes S: previously controlled on victoza and amaryl CBGs- feels like trending up- had been off meds since bowels were better- sugars 122-190 recently- hoping to trend back down (190 was an outlier). Was 122 and trending down Exercise and diet- not exercising (states being lazy)- wants to get back with PT but not sure when we will be open again for that.  Lab Results  Component Value Date   HGBA1C 5.6 12/29/2017   HGBA1C 6.3 10/06/2017   HGBA1C 6.1 02/01/2017   A/P: Sounds like blood sugar is sliding upwards-hopefully still controlled with A1c under 7-she will come by for car visit  labs  #hypertension S:  Poorly controlled on last check but was off her lisinopril- she has restarted that and atenolol continued.  BP Readings from Last 3 Encounters:  06/19/18 (!) 158/92  06/12/18 (!) 131/51  06/09/18 (!) 88/48  A/P: Poor control last check but suspect improved control  - encouraged her to find home cuff and let us know how its doing.  She agrees to do this-can send [name] a message through my chart  #Hyperlipidemia associated with type 2 diabetes mellitus ([place]) S: Last lipids were several years ago- we discussed new evidence on efficacy of statins in diabetics A/P: I told patient we would update lipids but would likely strongly recommend at least lovastatin low-dose once a week-she agrees to consider but seems to lean heavily towards declining this   #Depression S: doing well on prozac 40 mg States had not had to use xanax in sometime. Xanax for anxiety from Dr. Sabra Heck. She also uses temazepam at bedtime but knows not to take this within 8 hours of taking xanax (doesn't remember last use) A/P: Well-controlled-continue current medication  Other notes: 1.states got over her phlegm issues with pulmonology without augmentin fortunately. 2. Future car lab visit - cbc, cmp, lipid, a1c Normal Likely 4-6 months in person (6 months if a1c controlled- need to recommend once labs back)  Future Appointments  Date Time Provider [place]  11/23/2018  2:30 PM Lauraine Rinne, NP [facility] None   No follow-ups on file.  Lab/Order associations: Controlled type 2 diabetes mellitus without complication, without long-term current use of insulin ([place]) - Plan: CBC with Differential/Platelet, Comprehensive metabolic panel, Lipid panel, Hemoglobin A1c  Hyperlipidemia associated with type 2 diabetes mellitus ([place])  Recurrent major depression in full remission ([place])  No orders of the defined types were placed in this encounter.   Return precautions advised.  [name],  MD

## 2018-09-18 NOTE — Progress Notes (Signed)
Maddy, please contact and schedule car visit labs

## 2018-09-22 ENCOUNTER — Ambulatory Visit: Payer: Medicare Other | Admitting: Obstetrics & Gynecology

## 2018-09-28 ENCOUNTER — Other Ambulatory Visit: Payer: Self-pay

## 2018-09-29 ENCOUNTER — Other Ambulatory Visit: Payer: Medicare Other

## 2018-10-03 ENCOUNTER — Ambulatory Visit: Payer: Medicare Other | Admitting: Obstetrics & Gynecology

## 2018-10-04 ENCOUNTER — Other Ambulatory Visit (INDEPENDENT_AMBULATORY_CARE_PROVIDER_SITE_OTHER): Payer: Medicare Other

## 2018-10-04 ENCOUNTER — Other Ambulatory Visit: Payer: Self-pay | Admitting: Family Medicine

## 2018-10-04 DIAGNOSIS — E119 Type 2 diabetes mellitus without complications: Secondary | ICD-10-CM | POA: Diagnosis not present

## 2018-10-04 LAB — CBC WITH DIFFERENTIAL/PLATELET
Basophils Absolute: 0.1 10*3/uL (ref 0.0–0.1)
Basophils Relative: 0.6 % (ref 0.0–3.0)
Eosinophils Absolute: 0.4 10*3/uL (ref 0.0–0.7)
Eosinophils Relative: 4.7 % (ref 0.0–5.0)
HCT: 38.1 % (ref 36.0–46.0)
Hemoglobin: 12.9 g/dL (ref 12.0–15.0)
Lymphocytes Relative: 21.7 % (ref 12.0–46.0)
Lymphs Abs: 1.9 10*3/uL (ref 0.7–4.0)
MCHC: 33.8 g/dL (ref 30.0–36.0)
MCV: 93.3 fl (ref 78.0–100.0)
Monocytes Absolute: 0.7 10*3/uL (ref 0.1–1.0)
Monocytes Relative: 7.9 % (ref 3.0–12.0)
Neutro Abs: 5.8 10*3/uL (ref 1.4–7.7)
Neutrophils Relative %: 65.1 % (ref 43.0–77.0)
Platelets: 263 10*3/uL (ref 150.0–400.0)
RBC: 4.08 Mil/uL (ref 3.87–5.11)
RDW: 14.9 % (ref 11.5–15.5)
WBC: 8.8 10*3/uL (ref 4.0–10.5)

## 2018-10-04 LAB — LIPID PANEL
Cholesterol: 152 mg/dL (ref 0–200)
HDL: 42.4 mg/dL (ref >39.00)
LDL Cholesterol: 81 mg/dL (ref 0–99)
NonHDL: 109.52
Total CHOL/HDL Ratio: 4
Triglycerides: 143 mg/dL (ref 0.0–149.0)
VLDL: 28.6 mg/dL (ref 0.0–40.0)

## 2018-10-04 LAB — COMPREHENSIVE METABOLIC PANEL
ALT: 18 U/L (ref 0–35)
AST: 18 U/L (ref 0–37)
Albumin: 3.5 g/dL (ref 3.5–5.2)
Alkaline Phosphatase: 65 U/L (ref 39–117)
BUN: 12 mg/dL (ref 6–23)
CO2: 30 mEq/L (ref 19–32)
Calcium: 8.9 mg/dL (ref 8.4–10.5)
Chloride: 100 mEq/L (ref 96–112)
Creatinine, Ser: 0.58 mg/dL (ref 0.40–1.20)
GFR: 100.35 mL/min (ref >60.00)
Glucose, Bld: 98 mg/dL (ref 70–99)
Potassium: 4.1 mEq/L (ref 3.5–5.1)
Sodium: 138 mEq/L (ref 135–145)
Total Bilirubin: 0.5 mg/dL (ref 0.2–1.2)
Total Protein: 6.3 g/dL (ref 6.0–8.3)

## 2018-10-04 LAB — HEMOGLOBIN A1C: Hgb A1c MFr Bld: 7.5 % — ABNORMAL HIGH (ref 4.6–6.5)

## 2018-10-05 ENCOUNTER — Encounter: Payer: Self-pay | Admitting: Family Medicine

## 2018-10-06 ENCOUNTER — Other Ambulatory Visit: Payer: Self-pay

## 2018-10-06 DIAGNOSIS — R2681 Unsteadiness on feet: Secondary | ICD-10-CM

## 2018-10-10 ENCOUNTER — Other Ambulatory Visit: Payer: Self-pay | Admitting: Gastroenterology

## 2018-10-12 ENCOUNTER — Other Ambulatory Visit: Payer: Self-pay | Admitting: Family Medicine

## 2018-10-15 ENCOUNTER — Encounter: Payer: Self-pay | Admitting: Family Medicine

## 2018-10-16 ENCOUNTER — Telehealth: Payer: Self-pay | Admitting: Gastroenterology

## 2018-10-16 NOTE — Telephone Encounter (Signed)
Please advise. It looks like it was done at [place] last time because she was admitted to [facility] to help complete the prep.

## 2018-10-16 NOTE — Telephone Encounter (Signed)
She was done at [facility] long and the last time because of issues with mobility, if she is not having any mobility issues right now we can schedule at [facility] if not will need to be done at [place] [facility]

## 2018-10-17 ENCOUNTER — Ambulatory Visit (INDEPENDENT_AMBULATORY_CARE_PROVIDER_SITE_OTHER): Payer: Medicare Other | Admitting: Physical Therapy

## 2018-10-17 ENCOUNTER — Encounter: Payer: Self-pay | Admitting: Physical Therapy

## 2018-10-17 ENCOUNTER — Other Ambulatory Visit: Payer: Self-pay | Admitting: Family Medicine

## 2018-10-17 ENCOUNTER — Other Ambulatory Visit: Payer: Self-pay

## 2018-10-17 DIAGNOSIS — R2681 Unsteadiness on feet: Secondary | ICD-10-CM

## 2018-10-17 DIAGNOSIS — M6281 Muscle weakness (generalized): Secondary | ICD-10-CM | POA: Diagnosis not present

## 2018-10-17 MED ORDER — GLIMEPIRIDE 4 MG PO TABS: 8.0000 mg | ORAL_TABLET | Freq: Every day | ORAL | 1 refills | Status: DC

## 2018-10-17 NOTE — Telephone Encounter (Signed)
Patient will begin PT today. She states she has an unsteady gait. Fall risk. Deconditioned.  She is not certain she can get on the stretcher without assistance.  Hospital block is full.  Patient is aware I will be contacting her as soon as there is a date to offer for the colonoscopy.

## 2018-10-17 NOTE — Telephone Encounter (Signed)
Okay, please schedule her if I have any cancellation or additional spots open on the schedule in the future.  Thank you

## 2018-10-17 NOTE — Therapy (Addendum)
[facility] [address] [place], [place], [address] Phone: [phone]   Fax:  [phone]  Physical Therapy Evaluation  Patient Details  Name: [name] MRN: [id number] Date of Birth: [birth year] Referring Provider (PT): Marin Olp, MD   Encounter Date: 10/17/2018  PT End of Session - 10/17/18 1348    Visit Number  1    Number of Visits  12    Date for PT Re-Evaluation  11/28/18    PT Start Time  2774    PT Stop Time  1345    PT Time Calculation (min)  42 min    Activity Tolerance  Patient tolerated treatment well;Patient limited by fatigue    Behavior During Therapy  [facility] for tasks assessed/performed       Past Medical History:  Diagnosis Date  . Allergy   . Anemia   . Anxiety   . Arthritis    right knee;injections every 65month   . Asthma   . Bronchiectasis ([place]   . Cancer (HCC)    HX BREAST CANCER  . COMPRESSION FRACTURE, LUMBAR VERTEBRAE 08/21/2008   Qualifier: Diagnosis of  By: JArnoldo MoraleMD, JBalinda Quails  . Depression   . Diabetes mellitus    takes Amaryl and Januvia daily  . Difficulty sleeping   . Diverticulitis   . Early cataracts, bilateral   . Eczema   . Endometriosis   . Gastritis   . Glaucoma   . Hammer toe   . Headache(784.0)    Migraines  . Hyperlipidemia associated with type 2 diabetes mellitus ([place] 07/26/2007   Diet/exercise control.    . Hypertension    takes Atenolol daily  . IBS (irritable bowel syndrome)   . Joint pain   . Kidney stone   . Neuropathy   . Open wound of second toe of left foot    at pre-op appt, wound appears clear of infection 1 month post removal of toenail, no obvious exudate present nor any redness,  . Osteomyelitis ([place]    left 2nd toe  . Osteopenia   . PONV (postoperative nausea and vomiting)   . Posterior tibial tendon dysfunction    left foot  . Scoliosis   . Severe esophageal dysplasia   . 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 Thyroid disease   . Vitamin D deficiency    takes Vit d daily    Past Surgical History:  Procedure Laterality Date  . ADENOIDECTOMY     at age 78 . biopsy on back     benign  . CATARACT EXTRACTION    . CHOLECYSTECTOMY  06/2008  . COLONOSCOPY  11/02/05  . COLONOSCOPY WITH PROPOFOL N/A 05/17/2017   Procedure: COLONOSCOPY WITH PROPOFOL;  Surgeon: NMauri Pole MD;  Location: [facility];  Service: Endoscopy;  Laterality: N/A;  PT WILL BE ADMITTED THE DAY BEFORE FOR PREP PER ROBIN KB  . DILATION AND CURETTAGE OF UTERUS    . ENDOMETRIAL BIOPSY  06/22/2011   benign polyp, no hyperplasia  . excision on breast     internal infected suture from breast surgery  . excision removed from neck     infected lymph node  . FOOT SURGERY     left foot-cleaning out areas on toes at the wound center-having MRI to determine if osteomyelitis  . HYSTEROSCOPY  06/22/11   PMB submucosal myoma  . JOINT REPLACEMENT     left  total shoulder  . LAPAROSCOPIC OOPHORECTOMY Right 12/2004   absent LSO  . LAPAROTOMY    . MASTECTOMY Bilateral 1994 right, 1995 left  . SHOULDER HEMI-ARTHROPLASTY  01/01/2012   Procedure: SHOULDER HEMI-ARTHROPLASTY;  Surgeon: Roseanne Kaufman, MD;  Location: [facility];  Service: Orthopedics;  Laterality: Left;  Left Shoulder Hemi Arthroplasty with Repair and Reconstruction as Necessary   . THYROIDECTOMY     8yr ago. follows endocrine  . TONSILLECTOMY    . TOTAL KNEE ARTHROPLASTY Right 12/30/2014   Procedure: RIGHT TOTAL KNEE ARTHROPLASTY;  Surgeon: FGaynelle Arabian MD;  Location: [facility];  Service: Orthopedics;  Laterality: Right;  . TOTAL KNEE ARTHROPLASTY Left 11/29/2016   Procedure: LEFT TOTAL KNEE ARTHROPLASTY;  Surgeon: AGaynelle Arabian MD;  Location: [facility];  Service: Orthopedics;  Laterality: Left;    There were no vitals filed for this visit.   Subjective Assessment - 10/17/18 1306    Subjective  Pt is a 78y/o female who presents to OPPT for decreased mobility and  deconditioning.  Pt also reports core and back weakness which affects mobility.  Pt has SPC for the home and uses a RW in the community.      Patient Stated Goals  feel safe walking, improve strength and posture, decrease reliance on AD    Currently in Pain?  No/denies         [facility] - 10/17/18 1309      Assessment   Medical Diagnosis  R26.81 (ICD-10-CM) - Gait instability    Referring Provider (PT)  HMarin Olp MD    Onset Date/Surgical Date  --   1-2 years   Next MD Visit  01/30/2019    [facility]      Precautions   Precautions  Fall      Restrictions   Weight Bearing Restrictions  No      Balance Screen   Has the patient fallen in the past 6 months  No    Has the patient had a decrease in activity level because of a fear of falling?   Yes    Is the patient reluctant to leave their home because of a fear of falling?   Yes      [place]idence    Living Arrangements  Spouse/significant other;Other relatives   grandson (9y/o) - now back with parents   Type of [place] enter    Entrance Stairs-Number of Steps  2    Entrance Stairs-Rails  None    Home Layout  Able to live on main level with bedroom/bathroom;Two level    HJones- 2 wheels;Cane - single point    Additional Comments  has recumbent bike      Prior Function   Level of Independence  Independent    Vocation  Retired    [employer] retired from tEducation administrator    Leisure  crossword puzzles, watching TV; no regular exercise      Cognition   Overall Cognitive Status  Within Functional Limits for tasks assessed      Posture/Postural Control   Posture/Postural Control  Postural limitations    Postural Limitations  Rounded Shoulders;Forward head      ROM / Strength   AROM / PROM / Strength  Strength      Strength   Overall Strength Comments  all tested  in  sitting; suspect hip extension and abduction weakness given gait deviations    Strength Assessment Site  Hip;Knee;Ankle    Right/Left Hip  Right;Left    Right Hip Flexion  3+/5    Left Hip Flexion  3+/5    Right/Left Knee  Right;Left    Right Knee Flexion  4/5    Right Knee Extension  5/5    Left Knee Flexion  4/5    Left Knee Extension  5/5    Right/Left Ankle  Right;Left    Right Ankle Dorsiflexion  4/5    Left Ankle Dorsiflexion  4/5      Transfers   Transfers  Sit to Stand;Stand to Sit    Sit to Stand  6: Modified independent (Device/Increase time);With armrests    Five time sit to stand comments   41 sec: must use hands    Stand to Sit  Uncontrolled descent;6: Modified independent (Device/Increase time);With armrests      Ambulation/Gait   Assistive device  Rolling walker    Gait Pattern  Trendelenburg;Trunk flexed    Gait Comments  Lt external rotation with Lt foot over pronation      6 Minute Walk- Baseline   6 Minute Walk- Baseline  yes    BP (mmHg)  158/78    HR (bpm)  90    02 Sat (%RA)  97 %    Modified Borg Scale for Dyspnea  0- Nothing at all    Perceived Rate of Exertion (Borg)  7- Very, very light      6 Minute walk- Post Test   6 Minute Walk Post Test  yes    BP (mmHg)  170/60    HR (bpm)  106    02 Sat (%RA)  96 %    Modified Borg Scale for Dyspnea  5- Strong or hard breathing    Perceived Rate of Exertion (Borg)  13- Somewhat hard      6 minute walk test results    Aerobic Endurance Distance Walked  260    Endurance additional comments  3 min walk test performed; seated rest break needed at ~ 2:15                Objective measurements completed on examination: See above findings.      [facility] Adult PT Treatment/Exercise - 10/17/18 1309      Self-Care   Self-Care  Other Self-Care Comments    Other Self-Care Comments   long discussion with pt about accountability and gradual increase in activity at home.  Pt reports husband is essentially  waiting on her for all needs (bringing meals, toothbrush, makeup to her) and recommended she begin increasing activity this way to start.  Exercises can be gradually incorporated, but pt needs to start moving more at home.  Pt verbalized understanding and agreement.  Also brief discussion about nutrition and currently diet - ways to decrease sugar intake and increase protein intake.             PT Education - 10/17/18 1407    Education Details  see self care    Person(s) Educated  Patient    Methods  Explanation    Comprehension  Verbalized understanding          PT Long Term Goals - 10/17/18 1412      PT LONG TERM GOAL #1   Title  independent with HEP    Status  New    Target Date  11/28/18  PT LONG TERM GOAL #2   Title  improve 3MWT to > 325' without a seated rest break needed for improved mobility and endurance    Status  New    Target Date  11/28/18      PT LONG TERM GOAL #3   Title  5x STS improved to </= 30 sec for improved functional strength    Status  New    Target Date  11/28/18      PT LONG TERM GOAL #4   Title  demonstrate ability to stand at least once without UE support    Status  New    Target Date  11/28/18             Plan - 10/17/18 1408    Clinical Impression Statement  Pt is a 78 y/o female who presents to OPPT for decreased mobility, balance and deconditioning.  Pt demonstrates decreased balance and strength as well as decreased endurance and activity tolerance.  Pt will benefit from PT to address deficits listed and maximize safe, functional mobility.    Personal Factors and Comorbidities  Social Background;Comorbidity 3+    Comorbidities  bil TKA, depression, anxiety, neuropathy, glaucoma, DM.  current home situation difficult (was caring for 37 y/o grandson - now looking for placement for him)    Examination-Activity Limitations  Bathing;Locomotion Level;Stand;Toileting;Hygiene/Grooming;Transfers    Examination-Participation  Restrictions  Community Activity    Stability/Clinical Decision Making  Unstable/Unpredictable    Clinical Decision Making  High    Rehab Potential  Good    PT Frequency  2x / week    PT Duration  6 weeks    PT Treatment/Interventions  ADLs/Self Care Home Management;Cryotherapy;Moist Heat;DME Instruction;Gait training;Stair training;Functional mobility training;Neuromuscular re-education;Balance training;Therapeutic activities;Therapeutic exercise;Patient/family education;Vestibular    PT Next Visit Plan  begin light exercise, aerobic activity, provide HEP    Consulted and Agree with Plan of Care  Patient       Patient will benefit from skilled therapeutic intervention in order to improve the following deficits and impairments:  Abnormal gait, Postural dysfunction, Decreased mobility, Decreased activity tolerance, Decreased endurance, Decreased strength, Difficulty walking, Decreased balance  Visit Diagnosis: Unsteadiness on feet - Plan: PT plan of care cert/re-cert  Muscle weakness (generalized) - Plan: PT plan of care cert/re-cert     Problem List Patient Active Problem List   Diagnosis Date Noted  . History of total knee replacement, bilateral 11/14/2017  . History of adenomatous polyp of colon 05/18/2017  . Arthritis of midfoot 03/03/2016  . Hammertoes of both feet 03/03/2016  . Aortic atherosclerosis ([place]) 02/16/2016  . Solitary pulmonary nodule 01/08/2016  . 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  . Bronchiectasis ([place]) 10/18/2015  . IBS (irritable bowel syndrome) 07/17/2014  . Recurrent major depression in full remission ([place]) 01/03/2014  . Posterior tibial tendon dysfunction 01/03/2014  . Osteoarthritis, knee 01/03/2014  . Glaucoma 01/03/2014  . Essential hypertension, benign 01/03/2014  . Obesity (BMI 30-39.9) 06/15/2013  . Primary open-angle glaucoma 08/02/2012  . Foot tendinitis 07/20/2010  . INSOMNIA, CHRONIC 07/25/2009  . Fatty liver 03/18/2009  . GERD 12/25/2007  .  Hyperlipidemia associated with type 2 diabetes mellitus ([place]) 07/26/2007  . ANEMIA, B12 DEFICIENCY 12/29/2006  . Diabetes mellitus type II, controlled ([place]) 12/28/2006  . RESTLESS LEG SYNDROME, MILD 12/28/2006  . NEUROPATHY, IDIOPATHIC PERIPHERAL NEC 12/28/2006  . OSTEOPOROSIS 12/12/2006  . BREAST CANCER, HX OF 12/12/2006      Laureen Abrahams, PT, DPT 10/17/18 2:16 PM     [facility]  [facility] [address] [place], [place], [address] Phone: [phone]   Fax:  [phone]  Name: [name] MRN: [id number] Date of Birth: 02-28-1941   PHYSICAL THERAPY DISCHARGE SUMMARY  Visits from Start of Care: 1  Plan: Patient agrees to discharge.  Patient goals were not met. Patient is being discharged due to not returning since the last visit.  ?????     Lyndee Hensen, PT, DPT 10:07 AM  02/13/19

## 2018-10-19 DIAGNOSIS — L600 Ingrowing nail: Secondary | ICD-10-CM | POA: Diagnosis not present

## 2018-10-20 ENCOUNTER — Other Ambulatory Visit: Payer: Self-pay

## 2018-10-23 ENCOUNTER — Encounter: Payer: Medicare Other | Admitting: Physical Therapy

## 2018-10-24 ENCOUNTER — Encounter: Payer: Self-pay | Admitting: Obstetrics & Gynecology

## 2018-10-24 ENCOUNTER — Ambulatory Visit (INDEPENDENT_AMBULATORY_CARE_PROVIDER_SITE_OTHER): Payer: Medicare Other | Admitting: Obstetrics & Gynecology

## 2018-10-24 ENCOUNTER — Other Ambulatory Visit: Payer: Self-pay

## 2018-10-24 VITALS — BP 140/70 | HR 96 | Temp 96.3°F | Ht 72.0 in | Wt 242.0 lb

## 2018-10-24 DIAGNOSIS — N952 Postmenopausal atrophic vaginitis: Secondary | ICD-10-CM | POA: Diagnosis not present

## 2018-10-24 DIAGNOSIS — N898 Other specified noninflammatory disorders of vagina: Secondary | ICD-10-CM

## 2018-10-24 MED ORDER — FLUCONAZOLE 150 MG PO TABS: ORAL_TABLET | ORAL | 2 refills | Status: DC

## 2018-10-24 NOTE — Progress Notes (Signed)
GYNECOLOGY  VISIT  CC:   estring exchange   HPI: 78 y.o. G0P0 Married White or Caucasian female here for estring exchange.  She has been using this for years with good success and now her insurance is not covering it.  This is the last ring she's been able to get.  Has been six months since she was here.  She cannot remove the ring her self.    Denies vaginal bleeding.  Has been on two antibiotics in the past month.  She had to have a toenail removed.  She is having a little vaginal discharge and itching.    GYNECOLOGIC HISTORY: Patient's last menstrual period was 05/10/1992. Contraception: PMP Menopausal hormone therapy: estring   Patient Active Problem List   Diagnosis Date Noted  . History of total knee replacement, bilateral 11/14/2017  . History of adenomatous polyp of colon 05/18/2017  . Arthritis of midfoot 03/03/2016  . Hammertoes of both feet 03/03/2016  . Aortic atherosclerosis ([place]) 02/16/2016  . Solitary pulmonary nodule 01/08/2016  . 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  . Bronchiectasis ([place]) 10/18/2015  . IBS (irritable bowel syndrome) 07/17/2014  . Recurrent major depression in full remission ([place]) 01/03/2014  . Posterior tibial tendon dysfunction 01/03/2014  . Osteoarthritis, knee 01/03/2014  . Glaucoma 01/03/2014  . Essential hypertension, benign 01/03/2014  . Obesity (BMI 30-39.9) 06/15/2013  . Primary open-angle glaucoma 08/02/2012  . Foot tendinitis 07/20/2010  . INSOMNIA, CHRONIC 07/25/2009  . Fatty liver 03/18/2009  . GERD 12/25/2007  . Hyperlipidemia associated with type 2 diabetes mellitus ([place]) 07/26/2007  . ANEMIA, B12 DEFICIENCY 12/29/2006  . Diabetes mellitus type II, controlled ([place]) 12/28/2006  . RESTLESS LEG SYNDROME, MILD 12/28/2006  . NEUROPATHY, IDIOPATHIC PERIPHERAL NEC 12/28/2006  . OSTEOPOROSIS 12/12/2006  . BREAST CANCER, HX OF 12/12/2006    Past Medical History:  Diagnosis Date  . Allergy   . Anemia   . Anxiety   . Arthritis    right  knee;injections every 68months   . Asthma   . Bronchiectasis ([place])   . Cancer (HCC)    HX BREAST CANCER  . COMPRESSION FRACTURE, LUMBAR VERTEBRAE 08/21/2008   Qualifier: Diagnosis of  By: Arnoldo Morale MD, Balinda Quails   . Depression   . Diabetes mellitus    takes Amaryl and Januvia daily  . Difficulty sleeping   . Diverticulitis   . Early cataracts, bilateral   . Eczema   . Endometriosis   . Gastritis   . Glaucoma   . Hammer toe   . Headache(784.0)    Migraines  . Hyperlipidemia associated with type 2 diabetes mellitus ([place]) 07/26/2007   Diet/exercise control.    . Hypertension    takes Atenolol daily  . IBS (irritable bowel syndrome)   . Joint pain   . Kidney stone   . Neuropathy   . Open wound of second toe of left foot    at pre-op appt, wound appears clear of infection 1 month post removal of toenail, no obvious exudate present nor any redness,  . Osteomyelitis ([place])    left 2nd toe  . Osteopenia   . PONV (postoperative nausea and vomiting)   . Posterior tibial tendon dysfunction    left foot  . Scoliosis   . Severe esophageal dysplasia   . 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 Thyroid disease   . Vitamin D deficiency    takes Vit d daily    Past Surgical History:  Procedure  Laterality Date  . ADENOIDECTOMY     at age 32  . biopsy on back     benign  . CATARACT EXTRACTION    . CHOLECYSTECTOMY  06/2008  . COLONOSCOPY  11/02/05  . COLONOSCOPY WITH PROPOFOL N/A 05/17/2017   Procedure: COLONOSCOPY WITH PROPOFOL;  Surgeon: Mauri Pole, MD;  Location: [facility];  Service: Endoscopy;  Laterality: N/A;  PT WILL BE ADMITTED THE DAY BEFORE FOR PREP PER ROBIN KB  . DILATION AND CURETTAGE OF UTERUS    . ENDOMETRIAL BIOPSY  06/22/2011   benign polyp, no hyperplasia  . excision on breast     internal infected suture from breast surgery  . excision removed from neck     infected lymph node  . FOOT SURGERY     left foot-cleaning out  areas on toes at the wound center-having MRI to determine if osteomyelitis  . HYSTEROSCOPY  06/22/11   PMB submucosal myoma  . JOINT REPLACEMENT     left total shoulder  . LAPAROSCOPIC OOPHORECTOMY Right 12/2004   absent LSO  . LAPAROTOMY    . MASTECTOMY Bilateral 1994 right, 1995 left  . SHOULDER HEMI-ARTHROPLASTY  01/01/2012   Procedure: SHOULDER HEMI-ARTHROPLASTY;  Surgeon: Roseanne Kaufman, MD;  Location: [facility];  Service: Orthopedics;  Laterality: Left;  Left Shoulder Hemi Arthroplasty with Repair and Reconstruction as Necessary   . THYROIDECTOMY     16yrs ago. follows endocrine  . TONSILLECTOMY    . TOTAL KNEE ARTHROPLASTY Right 12/30/2014   Procedure: RIGHT TOTAL KNEE ARTHROPLASTY;  Surgeon: Gaynelle Arabian, MD;  [facility];  Service: Orthopedics;  Laterality: Right;  . TOTAL KNEE ARTHROPLASTY Left 11/29/2016   Procedure: LEFT TOTAL KNEE ARTHROPLASTY;  Surgeon: Gaynelle Arabian, MD;  [facility];  Service: Orthopedics;  Laterality: Left;    MEDS:   Current Outpatient Medications on File Prior to Visit  Medication Sig Dispense Refill  . ALPRAZolam (XANAX) 0.25 MG tablet Take 1 tablet (0.25 mg total) by mouth daily as needed for anxiety. 30 tablet 0  . amoxicillin (AMOXIL) 500 MG capsule Take 2,000 mg by mouth See admin instructions. Take 2,000 mg by mouth one hour prior to dental procedures, to "pre-treat"    . atenolol (TENORMIN) 25 MG tablet TAKE 1 TABLET BY MOUTH ONCE DAILY (Patient taking differently: Take 12.5 mg by mouth daily. ) 90 tablet 3  . Bacillus Coagulans-Inulin (ALIGN PREBIOTIC-PROBIOTIC PO) Take 1 capsule by mouth daily.     . budesonide-formoterol (SYMBICORT) 160-4.5 MCG/ACT inhaler Inhale 2 puffs into the lungs 2 (two) times daily.    . cetirizine (ZYRTEC) 10 MG tablet Take 10 mg by mouth daily.    . Cholecalciferol (VITAMIN D3) 2000 units capsule Take 2,000 Units by mouth daily.    . Coenzyme Q10 300 MG CAPS Take 300 mg by mouth daily.    . colesevelam (WELCHOL)  625 MG tablet TAKE 1 TABLET BY MOUTH THREE TIMES DAILY 90 tablet 0  . dicyclomine (BENTYL) 10 MG capsule TAKE 2 CAPSULES BY MOUTH 4 TIMES DAILY AS NEEDED FOR SPASMS 120 capsule 0  . dorzolamide-timolol (COSOPT) 22.3-6.8 MG/ML ophthalmic solution Place 1 drop into both eyes 2 (two) times daily.     [name] ESTRING 2 MG vaginal ring INSERT 1 RING  INTO VAGINA EVERY 3 MONTHS (Patient taking differently: Place 2 mg vaginally every 3 (three) months. ) 1 each 3  . fluconazole (DIFLUCAN) 150 MG tablet TAKE 1 TABLET BY MOUTH WITH ANTIBIOTIC PRESCRIPTION    .  FLUoxetine (PROZAC) 40 MG capsule TAKE 1 CAPSULE BY MOUTH ONCE DAILY. (Patient taking differently: Take 40 mg by mouth daily. ) 90 capsule 3  . glimepiride (AMARYL) 4 MG tablet Take 2 tablets (8 mg total) by mouth daily with breakfast. 180 tablet 1  . glucose blood (FREESTYLE LITE) test strip USE TO CHECK BLOOD SUGAR EVERY MORNING E11.9 100 each 3  . hydrocortisone 2.5 % cream     . latanoprost (XALATAN) 0.005 % ophthalmic solution Place 1 drop into both eyes at bedtime.    . levalbuterol (XOPENEX) 0.63 MG/3ML nebulizer solution Take 3 mLs (0.63 mg total) by nebulization every 6 (six) hours as needed for wheezing or shortness of breath. 360 mL 2  . lisinopril (ZESTRIL) 10 MG tablet Take 1 tablet (10 mg total) by mouth daily. NO PRINT ORDER- to add back to medication list 90 tablet 3  . Methylcellulose, Laxative, (CITRUCEL PO) Take 3 tablets by mouth at bedtime.    . Misc Natural Products (GRAPE SEED COMPLEX) CAPS Take 1 capsule by mouth daily.    . Multiple Vitamin (MULTIVITAMIN WITH MINERALS) TABS tablet Take 1 tablet by mouth daily.    . Polyvinyl Alcohol-Povidone (REFRESH OP) Place 1 drop into both eyes daily as needed (dry eyes).     . pramipexole (MIRAPEX) 0.5 MG tablet TAKE 1 TABLET BY MOUTH AT BEDTIME 90 tablet 0  . Respiratory Therapy Supplies (FLUTTER) DEVI Use as directed 1 each 0  . Selenium 200 MCG CAPS Take 200 mcg by mouth daily.    .  sucralfate (CARAFATE) 1 GM/10ML suspension Take 10 mLs (1 g total) by mouth 4 (four) times daily -  before meals and at bedtime. May take as needed 420 mL 0  . sulfamethoxazole-trimethoprim (BACTRIM DS) 800-160 MG tablet Take 1 tablet by mouth 2 (two) times daily. for 7 days    . temazepam (RESTORIL) 15 MG capsule TAKE 2 CAPSULES BY MOUTH AT BEDTIME AS NEEDED FOR  SLEEP 60 capsule 5  . VICTOZA 18 MG/3ML SOPN INJECT 1.8MG  INTO THE SKIN DAILY 9 mL 0  . vitamin B-12 (CYANOCOBALAMIN) 1000 MCG tablet Take 1,000 mcg by mouth daily.     No current facility-administered medications on file prior to visit.     ALLERGIES: Brimonidine, Cephalexin, Erythromycin ethylsuccinate, Levaquin [levofloxacin in d5w], Nitrofurantoin, and Percocet [oxycodone-acetaminophen]  Family History  Problem Relation Age of Onset  . Arthritis Mother   . COPD Father   . Heart disease Father        MI 68 - states he died of lockjaw  . Hypertension Father   . Hyperlipidemia Father   . Pancreatic cancer Paternal Grandfather   . Colon cancer Neg Hx   . Esophageal cancer Neg Hx   . Rectal cancer Neg Hx   . Stomach cancer Neg Hx     SH:  Married, non smoker  Review of Systems  All other systems reviewed and are negative.   PHYSICAL EXAMINATION:    BP 140/70   Pulse 96   Temp (!) 96.3 F (35.7 C) (Temporal)   Ht 6' (1.829 m)   Wt 242 lb (109.8 kg)   LMP 05/10/1992   BMI 32.82 kg/m     General appearance: alert, cooperative and appears stated age Abdomen: soft, non-tender; bowel sounds normal; no masses,  no organomegaly Lymph:  no inguinal LAD noted  Pelvic: External genitalia:  no lesions              Urethra:  normal appearing urethra with no masses, tenderness or lesions              Bartholins and Skenes: normal                 Vagina: normal vaginal mucosa noted but whitish adherent discharge is present, estring removed without any vaginal ulcerations noted, estring replaced              Cervix: no  lesions              Bimanual Exam:  Uterus:  normal size, contour, position, consistency, mobility, non-tender              Adnexa: no mass, fullness, tenderness  Chaperone was present for exam.  Assessment: Vaginal atrophy that has been treated successfully for years with Estring Vaginal discharge  Plan: Will try to proceed with estring prior authorization. Rx for diflucan 150mg  po x 1, repeat 72 hours.  #2/2RF (as pt typically has yeast vaginitis after any antibiotics)

## 2018-10-25 ENCOUNTER — Telehealth: Payer: Self-pay | Admitting: *Deleted

## 2018-10-25 ENCOUNTER — Encounter: Payer: Medicare Other | Admitting: Physical Therapy

## 2018-10-25 LAB — VAGINITIS/VAGINOSIS, DNA PROBE
Candida Species: NEGATIVE
Gardnerella vaginalis: NEGATIVE
Trichomonas vaginosis: NEGATIVE

## 2018-10-25 NOTE — Telephone Encounter (Signed)
PA for Estring 2mg  vaginal ring submitted to plan via [email or web address].   Key[place]ton [place]  The plan will fax you a determination, typically within 1 to 5 business days.

## 2018-10-26 ENCOUNTER — Other Ambulatory Visit: Payer: Self-pay | Admitting: Family Medicine

## 2018-10-26 ENCOUNTER — Encounter: Payer: Medicare Other | Admitting: Physical Therapy

## 2018-10-26 NOTE — Telephone Encounter (Signed)
Spoke with patient. Patient states she was contacted by [facility] [employer] to advise PA for Estring was denied. Patient would like to proceed with appeal. Advised patient will proceed with appeal, have not received fax with determination yet, this will include appeal instructions. Advised can take at least 30 days for response, will notify once response received. Patient verbalizes understanding and is agreeable.

## 2018-10-26 NOTE — Telephone Encounter (Signed)
Spoke with Vee. Was advised Estring 2 mg vaginal ring denied. Patient needs to try 2 covered alternatives, estradiol cream or yuvafem tab. Appeal form and denial letter will be faxed to [facility].    Reference #: [id number]

## 2018-10-26 NOTE — Telephone Encounter (Signed)
Patient states estring was denied and she would like to appeal it.

## 2018-10-26 NOTE — Telephone Encounter (Signed)
Appeal received and to Dr. Sabra Heck to review.

## 2018-10-27 ENCOUNTER — Encounter: Payer: Self-pay | Admitting: Obstetrics & Gynecology

## 2018-10-28 ENCOUNTER — Other Ambulatory Visit: Payer: Self-pay | Admitting: Gastroenterology

## 2018-10-30 ENCOUNTER — Ambulatory Visit (INDEPENDENT_AMBULATORY_CARE_PROVIDER_SITE_OTHER): Payer: Medicare Other | Admitting: Physical Therapy

## 2018-10-30 ENCOUNTER — Encounter: Payer: Self-pay | Admitting: Family Medicine

## 2018-10-30 ENCOUNTER — Ambulatory Visit (INDEPENDENT_AMBULATORY_CARE_PROVIDER_SITE_OTHER): Payer: Medicare Other | Admitting: Family Medicine

## 2018-10-30 ENCOUNTER — Encounter: Payer: Self-pay | Admitting: Physical Therapy

## 2018-10-30 ENCOUNTER — Other Ambulatory Visit: Payer: Self-pay

## 2018-10-30 ENCOUNTER — Telehealth: Payer: Self-pay | Admitting: Family Medicine

## 2018-10-30 DIAGNOSIS — R350 Frequency of micturition: Secondary | ICD-10-CM

## 2018-10-30 DIAGNOSIS — R300 Dysuria: Secondary | ICD-10-CM

## 2018-10-30 DIAGNOSIS — M6281 Muscle weakness (generalized): Secondary | ICD-10-CM | POA: Diagnosis not present

## 2018-10-30 DIAGNOSIS — R2681 Unsteadiness on feet: Secondary | ICD-10-CM

## 2018-10-30 LAB — POCT URINALYSIS DIPSTICK
Bilirubin, UA: NEGATIVE
Blood, UA: NEGATIVE
Glucose, UA: NEGATIVE
Ketones, UA: NEGATIVE
Leukocytes, UA: NEGATIVE
Nitrite, UA: NEGATIVE
Protein, UA: NEGATIVE
Spec Grav, UA: >=1.03 — AB (ref 1.010–1.025)
Urobilinogen, UA: 0.2 E.U./dL
pH, UA: 6 (ref 5.0–8.0)

## 2018-10-30 MED ORDER — CEPHALEXIN 500 MG PO CAPS: 500.0000 mg | ORAL_CAPSULE | Freq: Three times a day (TID) | ORAL | 0 refills | Status: AC

## 2018-10-30 NOTE — Therapy (Signed)
[facility] [address] [place], [place], [address] Phone: [phone]   Fax:  [phone]  Physical Therapy Treatment  Patient Details  Name: [name] MRN: [id number] Date of Birth: [birth year] Referring Provider (PT): Marin Olp, MD   Encounter Date: 10/30/2018  PT End of Session - 10/30/18 1407    Visit Number  2    Number of Visits  12    Date for PT Re-Evaluation  11/28/18    Authorization Type  Medicare    PT Start Time  1300    PT Stop Time  1342    PT Time Calculation (min)  42 min    Activity Tolerance  Patient tolerated treatment well;Patient limited by fatigue    Behavior During Therapy  [facility] for tasks assessed/performed       Past Medical History:  Diagnosis Date  . Allergy   . Anemia   . Anxiety   . Arthritis    right knee;injections every 52months   . Asthma   . Bronchiectasis ([place])   . Cancer (HCC)    HX BREAST CANCER  . COMPRESSION FRACTURE, LUMBAR VERTEBRAE 08/21/2008   Qualifier: Diagnosis of  By: Arnoldo Morale MD, Balinda Quails   . Depression   . Diabetes mellitus    takes Amaryl and Januvia daily  . Difficulty sleeping   . Diverticulitis   . Early cataracts, bilateral   . Eczema   . Endometriosis   . Gastritis   . Glaucoma   . Hammer toe   . Headache(784.0)    Migraines  . Hyperlipidemia associated with type 2 diabetes mellitus ([place]) 07/26/2007   Diet/exercise control.    . Hypertension    takes Atenolol daily  . IBS (irritable bowel syndrome)   . Joint pain   . Kidney stone   . Neuropathy   . Open wound of second toe of left foot    at pre-op appt, wound appears clear of infection 1 month post removal of toenail, no obvious exudate present nor any redness,  . Osteomyelitis ([place])    left 2nd toe  . Osteopenia   . PONV (postoperative nausea and vomiting)   . Posterior tibial tendon dysfunction    left foot  . Scoliosis   . Severe esophageal dysplasia   . Shingles    herpes zoster  opthalmicus with permanent damage to left eye  . Thyroid disease   . Vitamin D deficiency    takes Vit d daily    Past Surgical History:  Procedure Laterality Date  . ADENOIDECTOMY     at age 46  . CATARACT EXTRACTION    . CHOLECYSTECTOMY  06/2008  . COLONOSCOPY WITH PROPOFOL N/A 05/17/2017   Procedure: COLONOSCOPY WITH PROPOFOL;  Surgeon: Mauri Pole, MD;  [facility];  Service: Endoscopy;  Laterality: N/A;  PT WILL BE ADMITTED THE DAY BEFORE FOR PREP PER ROBIN KB  . DILATION AND CURETTAGE OF UTERUS    . excision on breast     internal infected suture from breast surgery  . excision removed from neck     infected lymph node  . FOOT SURGERY     left foot-cleaning out areas on toes at the wound center-having MRI to determine if osteomyelitis  . HYSTEROSCOPY  06/22/11   PMB submucosal myoma  . JOINT REPLACEMENT     left total shoulder  . LAPAROSCOPIC OOPHORECTOMY Right 12/2004   absent LSO  . LAPAROTOMY    . MASTECTOMY  Bilateral 1994 right, 1995 left  . SHOULDER HEMI-ARTHROPLASTY  01/01/2012   Procedure: SHOULDER HEMI-ARTHROPLASTY;  Surgeon: Roseanne Kaufman, MD;  Location: [facility];  Service: Orthopedics;  Laterality: Left;  Left Shoulder Hemi Arthroplasty with Repair and Reconstruction as Necessary   . THYROIDECTOMY     73yrs ago. follows endocrine  . TONSILLECTOMY    . TOTAL KNEE ARTHROPLASTY Right 12/30/2014   Procedure: RIGHT TOTAL KNEE ARTHROPLASTY;  Surgeon: Gaynelle Arabian, MD;  [facility];  Service: Orthopedics;  Laterality: Right;  . TOTAL KNEE ARTHROPLASTY Left 11/29/2016   Procedure: LEFT TOTAL KNEE ARTHROPLASTY;  Surgeon: Gaynelle Arabian, MD;  [facility];  Service: Orthopedics;  Laterality: Left;    There were no vitals filed for this visit.  Subjective Assessment - 10/30/18 1405    Subjective  Pt last seen 2 weeks ago, states she has not been feeling well. Also thinks she has UTI currently, will have MD visit later today. She feels more fatigued  today from this, but didnt want to cancel any more visits.    Patient Stated Goals  feel safe walking, improve strength and posture, decrease reliance on AD    Currently in Pain?  No/denies                       [facility] Adult PT Treatment/Exercise - 10/30/18 1305      Transfers   Transfers  --    Sit to Stand  --      Ambulation/Gait   Assistive device  --    Gait Pattern  --    Gait Comments  21ft x4, with education and practice for posture and mechanics wtih RW.      Posture/Postural Control   Posture/Postural Control  Postural limitations    Postural Limitations  Rounded Shoulders;Forward head      Self-Care   Self-Care  Posture    Posture  education and practice for finding optimal seated posture.    Other Self-Care Comments   Other education on starting to increase activity level at home, ie, walking laps after going to bathroom, and standing at recliner instead of sitting.       Neuro Re-ed    Neuro Re-ed Details   L/R and A/P weight shifts with 1 UE support x15 each;       Exercises   Exercises  Knee/Hip      Knee/Hip Exercises: Standing   Hip Flexion  10 reps;2 sets;Both    Hip Flexion Limitations  Stand/march with walker    Hip Abduction  5 reps;2 sets;Both      Knee/Hip Exercises: Seated   Long Arc Quad  10 reps;2 sets;Both    Sit to [employer]  5 reps;2 sets             PT Education - 10/30/18 1407    Education Details  Education on initial HEP, as well as ways to start to increase activity level at home.    Person(s) Educated  Patient    Methods  Explanation    Comprehension  Verbalized understanding          PT Long Term Goals - 10/17/18 1412      PT LONG TERM GOAL #1   Title  independent with HEP    Status  New    Target Date  11/28/18      PT LONG TERM GOAL #2   Title  improve 3MWT to > 325' without a seated rest break  needed for improved mobility and endurance    Status  New    Target Date  11/28/18      PT LONG TERM GOAL  #3   Title  5x STS improved to </= 30 sec for improved functional strength    Status  New    Target Date  11/28/18      PT LONG TERM GOAL #4   Title  demonstrate ability to stand at least once without UE support    Status  New    Target Date  11/28/18            Plan - 10/30/18 1412    Clinical Impression Statement  Pt with very low tolerance for standing activity today, and requires frequent seated rest breaks after just 1 exercise, or about 2 min of standing due to fatigue. Discussed easy ways to try to increase physical activity level at home, including standing and walking. Started on light ther ex, and HEP given to start these at home. Pt with much reliance on RW and requires constant cueing for posture. Recommend continued care.    Personal Factors and Comorbidities  Social Background;Comorbidity 3+    Comorbidities  bil TKA, depression, anxiety, neuropathy, glaucoma, DM.  current home situation difficult (was caring for 61 y/o grandson - now looking for placement for him)    Examination-Activity Limitations  Bathing;Locomotion Level;Stand;Toileting;Hygiene/Grooming;Transfers    Examination-Participation Restrictions  Community Activity    Stability/Clinical Decision Making  Unstable/Unpredictable    Rehab Potential  Good    PT Frequency  2x / week    PT Duration  6 weeks    PT Treatment/Interventions  ADLs/Self Care Home Management;Cryotherapy;Moist Heat;DME Instruction;Gait training;Stair training;Functional mobility training;Neuromuscular re-education;Balance training;Therapeutic activities;Therapeutic exercise;Patient/family education;Vestibular    PT Next Visit Plan  begin light exercise, aerobic activity, provide HEP    Consulted and Agree with Plan of Care  Patient       Patient will benefit from skilled therapeutic intervention in order to improve the following deficits and impairments:  Abnormal gait, Postural dysfunction, Decreased mobility, Decreased activity  tolerance, Decreased endurance, Decreased strength, Difficulty walking, Decreased balance  Visit Diagnosis: 1. Unsteadiness on feet   2. Muscle weakness (generalized)        Problem List Patient Active Problem List   Diagnosis Date Noted  . History of total knee replacement, bilateral 11/14/2017  . History of adenomatous polyp of colon 05/18/2017  . Arthritis of midfoot 03/03/2016  . Hammertoes of both feet 03/03/2016  . Aortic atherosclerosis ([place]) 02/16/2016  . Solitary pulmonary nodule 01/08/2016  . 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  . Bronchiectasis ([place]) 10/18/2015  . IBS (irritable bowel syndrome) 07/17/2014  . Recurrent major depression in full remission ([place]) 01/03/2014  . Posterior tibial tendon dysfunction 01/03/2014  . Osteoarthritis, knee 01/03/2014  . Glaucoma 01/03/2014  . Essential hypertension, benign 01/03/2014  . Obesity (BMI 30-39.9) 06/15/2013  . Primary open-angle glaucoma 08/02/2012  . Foot tendinitis 07/20/2010  . INSOMNIA, CHRONIC 07/25/2009  . Fatty liver 03/18/2009  . GERD 12/25/2007  . Hyperlipidemia associated with type 2 diabetes mellitus ([place]) 07/26/2007  . ANEMIA, B12 DEFICIENCY 12/29/2006  . Diabetes mellitus type II, controlled ([place]) 12/28/2006  . RESTLESS LEG SYNDROME, MILD 12/28/2006  . NEUROPATHY, IDIOPATHIC PERIPHERAL NEC 12/28/2006  . OSTEOPOROSIS 12/12/2006  . BREAST CANCER, HX OF 12/12/2006    Lyndee Hensen, PT, DPT 2:15 PM  10/30/18    [facility] [place], [place], [address] Phone: [phone]  Fax:  [phone]  Name: [name] MRN: [id number] Date of Birth: [birth year]

## 2018-10-30 NOTE — Telephone Encounter (Signed)
Please advise 

## 2018-10-30 NOTE — Patient Instructions (Signed)
Health Maintenance Due  Topic Date Due  . OPHTHALMOLOGY EXAM  09/03/2018    Depression screen [facility] 2/9 08/31/2018 01/19/2018 12/29/2017  Decreased Interest 0 1 1  Down, Depressed, Hopeless 0 3 2  PHQ - 2 Score 0 4 3  Altered sleeping 0 2 0  Tired, decreased energy 3 2 3   Change in appetite 0 1 0  Feeling bad or failure about yourself  0 0 0  Trouble concentrating 0 0 0  Moving slowly or fidgety/restless 0 0 0  Suicidal thoughts 0 0 0  PHQ-9 Score 3 9 6   Difficult doing work/chores Not difficult at all Somewhat difficult Very difficult  Some recent data might be hidden

## 2018-10-30 NOTE — Telephone Encounter (Signed)
Pt has PT today at 1pm and is having UTI symptoms. Pt wants to know if she can do a urine test while here this afternoon. Please advise.

## 2018-10-30 NOTE — Progress Notes (Signed)
Phone [phone]   Subjective:  Virtual visit via phonenote Chief Complaint  Patient presents with  . Urinary Tract Infection    Started yesterday, has not tried any OTC medications.. Reports urgency, burning, painful urination, and feeling like she is not emptying    This visit type was conducted due to national recommendations for restrictions regarding the COVID-19 Pandemic (e.g. social distancing).  This format is felt to be most appropriate for this patient at this time balancing risks to patient and risks to population by having him in for in person visit.  All issues noted in this document were discussed and addressed.  No physical exam was performed (except for noted visual exam or audio findings with Telehealth visits).  The patient has consented to conduct a Telehealth visit and understands insurance will be billed.   Our team/I connected with [name] at  4:40 PM EDT by phone (patient did not have equipment for webex) and verified that I am speaking with the correct person using two identifiers.  Location patient: [facility] Location provider: [facility], office Persons participating in the virtual visit:  patient  Time on phone: 11 minutes Counseling provided about possible SE of medications  Our team/I discussed the limitations of evaluation and management by telemedicine and the availability of in person appointments. In light of current covid-19 pandemic, patient also understands that we are trying to protect them by minimizing in office contact if at all possible.  The patient expressed consent for telemedicine visit and agreed to proceed. Patient understands insurance will be billed.   Past Medical History-  Patient Active Problem List   Diagnosis Date Noted  . 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    Priority: High  . Bronchiectasis ([place]) 10/18/2015    Priority: High  . Diabetes mellitus type II, controlled ([place]) 12/28/2006    Priority: High  . History of adenomatous  polyp of colon 05/18/2017    Priority: Medium  . Aortic atherosclerosis ([place]) 02/16/2016    Priority: Medium  . Solitary pulmonary nodule 01/08/2016    Priority: Medium  . IBS (irritable bowel syndrome) 07/17/2014    Priority: Medium  . Recurrent major depression in full remission ([place]) 01/03/2014    Priority: Medium  . Essential hypertension, benign 01/03/2014    Priority: Medium  . Primary open-angle glaucoma 08/02/2012    Priority: Medium  . Fatty liver 03/18/2009    Priority: Medium  . Hyperlipidemia associated with type 2 diabetes mellitus ([place]) 07/26/2007    Priority: Medium  . ANEMIA, B12 DEFICIENCY 12/29/2006    Priority: Medium  . RESTLESS LEG SYNDROME, MILD 12/28/2006    Priority: Medium  . OSTEOPOROSIS 12/12/2006    Priority: Medium  . History of total knee replacement, bilateral 11/14/2017    Priority: Low  . Arthritis of midfoot 03/03/2016    Priority: Low  . Hammertoes of both feet 03/03/2016    Priority: Low  . Posterior tibial tendon dysfunction 01/03/2014    Priority: Low  . Osteoarthritis, knee 01/03/2014    Priority: Low  . Glaucoma 01/03/2014    Priority: Low  . Obesity (BMI 30-39.9) 06/15/2013    Priority: Low  . Foot tendinitis 07/20/2010    Priority: Low  . INSOMNIA, CHRONIC 07/25/2009    Priority: Low  . GERD 12/25/2007    Priority: Low  . NEUROPATHY, IDIOPATHIC PERIPHERAL NEC 12/28/2006    Priority: Low  . BREAST CANCER, HX OF 12/12/2006    Priority: Low    Medications- reviewed and updated Current  Outpatient Medications  Medication Sig Dispense Refill  . atenolol (TENORMIN) 25 MG tablet TAKE 1 TABLET BY MOUTH ONCE DAILY (Patient taking differently: Take 12.5 mg by mouth daily. ) 90 tablet 3  . Bacillus Coagulans-Inulin (ALIGN PREBIOTIC-PROBIOTIC PO) Take 1 capsule by mouth daily.     . budesonide-formoterol (SYMBICORT) 160-4.5 MCG/ACT inhaler Inhale 2 puffs into the lungs 2 (two) times daily.    . cetirizine (ZYRTEC) 10 MG tablet Take  10 mg by mouth daily.    . Cholecalciferol (VITAMIN D3) 2000 units capsule Take 2,000 Units by mouth daily.    . Coenzyme Q10 300 MG CAPS Take 300 mg by mouth daily.    . colesevelam (WELCHOL) 625 MG tablet TAKE 1 TABLET BY MOUTH THREE TIMES DAILY 90 tablet 0  . dicyclomine (BENTYL) 10 MG capsule TAKE 2 CAPSULES BY MOUTH 4 TIMES DAILY AS NEEDED FOR SPASMS 120 capsule 0  . dorzolamide-timolol (COSOPT) 22.3-6.8 MG/ML ophthalmic solution Place 1 drop into both eyes 2 (two) times daily.     [name] ESTRING 2 MG vaginal ring INSERT 1 RING  INTO VAGINA EVERY 3 MONTHS (Patient taking differently: Place 2 mg vaginally every 3 (three) months. ) 1 each 3  . glimepiride (AMARYL) 4 MG tablet Take 2 tablets (8 mg total) by mouth daily with breakfast. 180 tablet 1  . hydrocortisone 2.5 % cream     . latanoprost (XALATAN) 0.005 % ophthalmic solution Place 1 drop into both eyes at bedtime.    [name] lisinopril (ZESTRIL) 10 MG tablet Take 1 tablet (10 mg total) by mouth daily. NO PRINT ORDER- to add back to medication list 90 tablet 3  . Methylcellulose, Laxative, (CITRUCEL PO) Take 3 tablets by mouth at bedtime.    . Multiple Vitamin (MULTIVITAMIN WITH MINERALS) TABS tablet Take 1 tablet by mouth daily.    . pramipexole (MIRAPEX) 0.5 MG tablet TAKE 1 TABLET BY MOUTH AT BEDTIME 90 tablet 0  . Respiratory Therapy Supplies (FLUTTER) DEVI Use as directed 1 each 0  . Selenium 200 MCG CAPS Take 200 mcg by mouth daily.    . temazepam (RESTORIL) 15 MG capsule TAKE 2 CAPSULES BY MOUTH AT BEDTIME AS NEEDED FOR  SLEEP 60 capsule 5  . VICTOZA 18 MG/3ML SOPN INJECT 1.8MG  INTO THE SKIN DAILY 9 mL 0  . vitamin B-12 (CYANOCOBALAMIN) 1000 MCG tablet Take 1,000 mcg by mouth daily.    [name] ALPRAZolam (XANAX) 0.25 MG tablet Take 1 tablet (0.25 mg total) by mouth daily as needed for anxiety. 30 tablet 0  . cephALEXin (KEFLEX) 500 MG capsule Take 1 capsule (500 mg total) by mouth 3 (three) times daily for 7 days. 21 capsule 0  . FLUoxetine  (PROZAC) 40 MG capsule TAKE 1 CAPSULE BY MOUTH ONCE DAILY. (Patient taking differently: Take 40 mg by mouth daily. ) 90 capsule 3  . glucose blood (FREESTYLE LITE) test strip USE TO CHECK BLOOD SUGAR EVERY MORNING E11.9 100 each 3  . levalbuterol (XOPENEX) 0.63 MG/3ML nebulizer solution Take 3 mLs (0.63 mg total) by nebulization every 6 (six) hours as needed for wheezing or shortness of breath. 360 mL 2  . Misc Natural Products (GRAPE SEED COMPLEX) CAPS Take 1 capsule by mouth daily.     No current facility-administered medications for this visit.      Objective:  Temp (!) 96.6 F (35.9 C)   LMP 05/10/1992  self reported vitals  Nonlabored voice, normal speech   Results for orders placed or performed in  visit on 10/30/18 (from the past 24 hour(s))  POCT urinalysis dipstick     Status: Abnormal   Collection Time: 10/30/18  2:20 PM  Result Value Ref Range   Color, UA Yellow    Clarity, UA Clear    Glucose, UA Negative Negative   Bilirubin, UA Negative    Ketones, UA Negative    Spec Grav, UA >=1.030 (A) 1.010 - 1.025   Blood, UA Negative    pH, UA 6.0 5.0 - 8.0   Protein, UA Negative Negative   Urobilinogen, UA 0.2 0.2 or 1.0 E.U./dL   Nitrite, UA Negative    Leukocytes, UA Negative Negative   Appearance     Odor         Assessment and Plan    #Concern for UTI S: Patients symptoms started yesterday.  Complains of dysuria: yes 3-4/10; polyuria: yes; nocturia: twice last night and normally doesn't get up; urgency: yes. Also dealing with incomplete voiding and suprapubic discomfort. No vaginal discharge.  Symptoms are worsening.  ROS- no fever, chills, nausea, vomiting, flank pain. No blood in urine.  A/P: UA  Without clear but by symptoms likely UTI. Will get culture. Empiric treatment with: keflex ordered. She has an intolerance of diarrhea with this but she recently finished bactrim which would be another option and I would be concerned about resistance to that and also has  quinolone allergy and macrobid allergy- not a lot of options here. She wants to try AZO to start and if not improving will start keflex- otherwise we will wait on culture to verify infection and make sure shes on the right antibiotic.  Patient to follow up if new or worsening symptoms or failure to improve.   Future Appointments  Date Time Provider [place]  11/02/2018 12:00 PM Lyndee Hensen, PT [facility]  11/06/2018  1:00 PM Lyndee Hensen, PT [facility]  11/08/2018 12:00 PM Lyndee Hensen, PT [facility]  11/23/2018  2:30 PM Lauraine Rinne, NP [facility] None  01/30/2019  3:40 PM Marin Olp, MD [facility]  02/05/2019  3:00 PM Megan Salon, MD [place] None   Lab/Order associations:   ICD-10-CM   1. Frequency of urination  R35.0 Urine Culture    POCT urinalysis dipstick  2. Dysuria  R30.0 Urine Culture    POCT urinalysis dipstick   Meds ordered this encounter  Medications  . cephALEXin (KEFLEX) 500 MG capsule    Sig: Take 1 capsule (500 mg total) by mouth 3 (three) times daily for 7 days.    Dispense:  21 capsule    Refill:  0    Return precautions advised.  [name], MD

## 2018-10-30 NOTE — Telephone Encounter (Signed)
See note from today. For me- I would ask anytime someone wants to "drop off" a urine that if possible we get them set up for a video visit especially with how that is so easy right now. Im generally not able to look at my basket until later in the day or evening.   Also with time sensitive asks- please send me a skype or chat with me if possible (once again if we scheduled them then wouldn't have to do that necessarily)  Just an FYI everyone and possibly a good chance for [name] to all chat at next clinical meeting and see if other physicians feel the same way.

## 2018-11-01 ENCOUNTER — Telehealth: Payer: Self-pay

## 2018-11-01 ENCOUNTER — Encounter: Payer: Medicare Other | Admitting: Physical Therapy

## 2018-11-01 NOTE — Telephone Encounter (Signed)
Spoke to patient again and she states that she did fill Keflex rx but did not take it.  She reports that it previously gave her diarrhea and she was trying to avoid taking if possible.  States that she is feeling much better since taking the AZO.

## 2018-11-01 NOTE — Telephone Encounter (Signed)
Spoke to patient-she was in for OV on 6/22 for sx of UTI.  Patient states that she is feeling better and does not feel the need to leave another UA.  She has taken AZO which has relieved her symptoms.  She was due to come in for appt 6/25 w/Lauren for PT but states that she had to cancel her appt anyway.

## 2018-11-01 NOTE — Telephone Encounter (Signed)
The issue is still- want to make sure she doesn't have a UTI and is just covering it up with the AZO- if her symptoms return off AZO really needs urine culture

## 2018-11-01 NOTE — Telephone Encounter (Signed)
She did not have to take the keflex and symptoms did not return off azo?

## 2018-11-02 ENCOUNTER — Other Ambulatory Visit: Payer: Medicare Other

## 2018-11-02 ENCOUNTER — Other Ambulatory Visit: Payer: Self-pay

## 2018-11-02 ENCOUNTER — Encounter: Payer: Medicare Other | Admitting: Physical Therapy

## 2018-11-02 NOTE — Telephone Encounter (Signed)
Spoke with patient she will come in today to have urine culture done.

## 2018-11-03 LAB — URINE CULTURE
MICRO NUMBER:: 607541
Result:: NO GROWTH
SPECIMEN QUALITY:: ADEQUATE

## 2018-11-06 ENCOUNTER — Encounter: Payer: Medicare Other | Admitting: Physical Therapy

## 2018-11-06 ENCOUNTER — Other Ambulatory Visit: Payer: Self-pay | Admitting: Gastroenterology

## 2018-11-08 ENCOUNTER — Encounter: Payer: Medicare Other | Admitting: Physical Therapy

## 2018-11-08 ENCOUNTER — Other Ambulatory Visit: Payer: Self-pay

## 2018-11-08 ENCOUNTER — Encounter: Payer: Self-pay | Admitting: Podiatry

## 2018-11-08 ENCOUNTER — Ambulatory Visit (INDEPENDENT_AMBULATORY_CARE_PROVIDER_SITE_OTHER): Payer: Medicare Other | Admitting: Podiatry

## 2018-11-08 DIAGNOSIS — M79675 Pain in left toe(s): Secondary | ICD-10-CM | POA: Diagnosis not present

## 2018-11-08 DIAGNOSIS — M79674 Pain in right toe(s): Secondary | ICD-10-CM

## 2018-11-08 DIAGNOSIS — B351 Tinea unguium: Secondary | ICD-10-CM | POA: Diagnosis not present

## 2018-11-08 DIAGNOSIS — E1142 Type 2 diabetes mellitus with diabetic polyneuropathy: Secondary | ICD-10-CM | POA: Diagnosis not present

## 2018-11-08 DIAGNOSIS — E1140 Type 2 diabetes mellitus with diabetic neuropathy, unspecified: Secondary | ICD-10-CM | POA: Insufficient documentation

## 2018-11-08 NOTE — Progress Notes (Signed)
This patient presents to the office with chief complaint of long thick nails and diabetic feet.  This patient  says there  is  no pain and discomfort in her  feet.  This patient says there are long thick painful nails.  These nails are painful walking and wearing shoes.  Patient has no history of infection or drainage from both feet.  Patient is unable to  self treat his own nails .  Patient says her second toenail was removed at [facility] but they refused to provide treatment for long nails. This patient presents  to the office today for treatment of the  long nails and a foot evaluation due to history of  diabetes.  General Appearance  Alert, conversant and in no acute stress.  Vascular  Dorsalis pedis and posterior tibial  pulses are palpable  bilaterally.  Capillary return is within normal limits  bilaterally. Temperature is within normal limits  bilaterally.  Neurologic  Senn-Weinstein monofilament wire test absent   bilaterally. Muscle power within normal limits bilaterally.  Nails Thick disfigured discolored nails with subungual debris  from hallux to fifth toes bilaterally. No evidence of bacterial infection or drainage bilaterally.  Orthopedic  No limitations of motion of motion feet .  No crepitus or effusions noted.  No bony pathology or digital deformities noted.  HAV.    Skin  normotropic skin with no porokeratosis noted bilaterally.  No signs of infections or ulcers noted.     Onychomycosis  Diabetes with neuropathy.  IE  Debride nails x 10.  A diabetic foot exam was performed and there is no evidence of any vascular  pathology.  Absent LOPS noted.  RTC 3 months.   Gardiner Barefoot DPM

## 2018-11-18 ENCOUNTER — Other Ambulatory Visit: Payer: Self-pay | Admitting: Family Medicine

## 2018-11-21 MED ORDER — ESTRING 2 MG VA RING: VAGINAL_RING | VAGINAL | 3 refills | Status: DC

## 2018-11-21 NOTE — Telephone Encounter (Signed)
Patient calling regarding appeal for medication.

## 2018-11-21 NOTE — Telephone Encounter (Signed)
Estring Rx faxed.   Routing to Dr. Lestine Box.   Encounter closed.

## 2018-11-21 NOTE — Telephone Encounter (Signed)
Call reviewed with Dr. Sabra Heck, call returned to patient. Patient confirmed she has not tried any alternative, has been using Estring since 1995, request to continue. Advised per PA,  needs to try 2 covered alternatives. Patient will compare cost of medication and return call to advise if new or printed Rx is needed. Patient will return call, thankful for f/u.

## 2018-11-21 NOTE — Telephone Encounter (Signed)
Spoke with patient. Patient request printed Rx for Estring 2mg  and faxed to verified pharmacy.   Rx printed and to Dr. Sabra Heck to sign.

## 2018-11-23 ENCOUNTER — Ambulatory Visit: Payer: Medicare Other | Admitting: Pulmonary Disease

## 2018-11-23 NOTE — Progress Notes (Deleted)
@Patient  ID: [name], female    DOB: 1940-06-01, 78 y.o.   MRN: [id number]  No chief complaint on file.   Referring provider: Marin Olp, MD  HPI:  78 year old female never smoker followed in our office for bronchiectasis and tracheomalacia  PMH: GERD, IBS, type 2 diabetes, hypertension, chronic kidney disease stage II Smoker/ Smoking History: Never smoker Maintenance:  Symbicort 160 Pt of: Dr. [place] Bells   11/23/2018  - Visit   HPI  Tests:   PFT: July 2017 pulmonary function testing ratio 77%, FEV1 2.02 L, 71% predicted, FVC 2.63 L 70% predicted, total lung capacity 5.02 L, 81% predicted, DLCO 25.7 74% predicted  11/2015 CT chest > no ILD, air trapping noted, mild cylindrical bronchiectasis, severe tracheomalacia noted, aortic calcification  12/2015 immunoglobulin profile normal, Alpha-1 antitrypsin profile normal  FENO:  No results found for: NITRICOXIDE  PFT: PFT Results Latest Ref Rng & Units 12/02/2015  FVC-Pre L 2.52  FVC-Predicted Pre % 67  FVC-Post L 2.63  FVC-Predicted Post % 70  Pre FEV1/FVC % % 77  Post FEV1/FCV % % 77  FEV1-Pre L 1.94  FEV1-Predicted Pre % 68  FEV1-Post L 2.02  DLCO UNC% % 74  DLCO COR %Predicted % 94  TLC L 5.02  TLC % Predicted % 81  RV % Predicted % 91    Imaging: No results found.    Specialty Problems      Pulmonary Problems   Bronchiectasis ([place])    11/2015 CT chest > no ILD, air trapping noted, mild cylindrical bronchiectasis, severe tracheomalacia noted, aortic calcification 12/2015 immunoglobulin profile normal, Alpha-1 antitrypsin profile normal      Tracheomalacia    11/2015 CT chest > no ILD, air trapping noted, mild cylindrical bronchiectasis, severe tracheomalacia noted, aortic calcification      Solitary pulmonary nodule      Allergies  Allergen Reactions  . Nitrofurantoin Other (See Comments)    Severe headache HEADACHE  . Brimonidine Other (See Comments)    Made eyes and surrounding areas  RED  . Cephalexin Diarrhea and Other (See Comments)    Patient can't remember reaction (per chart at [facility] states diarrhea)  . Erythromycin Ethylsuccinate Hives and Diarrhea  . Levaquin [Levofloxacin In D5w] Other (See Comments)    Pain in tendons   . Levofloxacin Other (See Comments)    Pain in tendons   . Percocet [Oxycodone-Acetaminophen] Itching    Immunization History  Administered Date(s) Administered  . H1N1 06/05/2008  . Influenza Split 02/07/2012  . Influenza Whole 03/10/2000, 01/24/2008, 03/10/2009, 01/20/2010  . Influenza, High Dose Seasonal PF 02/12/2013, 01/31/2017, 01/19/2018  . Influenza,inj,Quad PF,6+ Mos 01/17/2014, 03/27/2015  . Influenza-Unspecified 02/09/2016  . Pneumococcal Conjugate-13 01/17/2014  . Pneumococcal Polysaccharide-23 03/10/2000, 05/02/2007, 01/02/2012  . Td 05/10/1992, 12/25/2007    Past Medical History:  Diagnosis Date  . Allergy   . Anemia   . Anxiety   . Arthritis    right knee;injections every 75months   . Asthma   . Bronchiectasis ([place])   . Cancer (HCC)    HX BREAST CANCER  . COMPRESSION FRACTURE, LUMBAR VERTEBRAE 08/21/2008   Qualifier: Diagnosis of  By: Arnoldo Morale MD, Balinda Quails   . Depression   . Diabetes mellitus    takes Amaryl and Januvia daily  . Difficulty sleeping   . Diverticulitis   . Early cataracts, bilateral   . Eczema   . Endometriosis   . Gastritis   . Glaucoma   . Hammer toe   .  Headache(784.0)    Migraines  . Hyperlipidemia associated with type 2 diabetes mellitus ([place]) 07/26/2007   Diet/exercise control.    . Hypertension    takes Atenolol daily  . IBS (irritable bowel syndrome)   . Joint pain   . Kidney stone   . Neuropathy   . Open wound of second toe of left foot    at pre-op appt, wound appears clear of infection 1 month post removal of toenail, no obvious exudate present nor any redness,  . Osteomyelitis ([place])    left 2nd toe  . Osteopenia   . PONV (postoperative nausea and vomiting)   . Posterior  tibial tendon dysfunction    left foot  . Scoliosis   . Severe esophageal dysplasia   . 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 Thyroid disease   . Vitamin D deficiency    takes Vit d daily    Tobacco History: Social History   Tobacco Use  Smoking Status Never Smoker  Smokeless Tobacco Never Used   Counseling given: Not Answered   Continue to not smoke  Outpatient Encounter Medications as of 11/24/2018  Medication Sig  . ALPRAZolam (XANAX) 0.25 MG tablet Take 1 tablet (0.25 mg total) by mouth daily as needed for anxiety.  [name] atenolol (TENORMIN) 25 MG tablet TAKE 1 TABLET BY MOUTH ONCE DAILY (Patient taking differently: Take 12.5 mg by mouth daily. )  . Bacillus Coagulans-Inulin (ALIGN PREBIOTIC-PROBIOTIC PO) Take 1 capsule by mouth daily.   . budesonide-formoterol (SYMBICORT) 160-4.5 MCG/ACT inhaler Inhale 2 puffs into the lungs 2 (two) times daily.  . cetirizine (ZYRTEC) 10 MG tablet Take 10 mg by mouth daily.  . Cholecalciferol (VITAMIN D3) 2000 units capsule Take 2,000 Units by mouth daily.  . Coenzyme Q10 300 MG CAPS Take 300 mg by mouth daily.  . colesevelam (WELCHOL) 625 MG tablet TAKE 1 TABLET BY MOUTH THREE TIMES DAILY  . dicyclomine (BENTYL) 10 MG capsule TAKE 2 CAPSULES BY MOUTH 4 TIMES DAILY AS NEEDED FOR  SPASM  . dorzolamide-timolol (COSOPT) 22.3-6.8 MG/ML ophthalmic solution Place 1 drop into both eyes 2 (two) times daily.   [name] ESTRING 2 MG vaginal ring INSERT 1 RING  INTO VAGINA EVERY 3 MONTHS  . FLUoxetine (PROZAC) 40 MG capsule TAKE 1 CAPSULE BY MOUTH ONCE DAILY. (Patient taking differently: Take 40 mg by mouth daily. )  . glimepiride (AMARYL) 4 MG tablet Take 2 tablets (8 mg total) by mouth daily with breakfast.  . glucose blood (FREESTYLE LITE) test strip USE TO CHECK BLOOD SUGAR EVERY MORNING E11.9  . hydrocortisone 2.5 % cream   . latanoprost (XALATAN) 0.005 % ophthalmic solution Place 1 drop into both eyes at bedtime.  .  levalbuterol (XOPENEX) 0.63 MG/3ML nebulizer solution Take 3 mLs (0.63 mg total) by nebulization every 6 (six) hours as needed for wheezing or shortness of breath.  . lisinopril (ZESTRIL) 10 MG tablet Take 1 tablet (10 mg total) by mouth daily. NO PRINT ORDER- to add back to medication list  . Methylcellulose, Laxative, (CITRUCEL PO) Take 3 tablets by mouth at bedtime.  . Misc Natural Products (GRAPE SEED COMPLEX) CAPS Take 1 capsule by mouth daily.  . Multiple Vitamin (MULTIVITAMIN WITH MINERALS) TABS tablet Take 1 tablet by mouth daily.  . pramipexole (MIRAPEX) 0.5 MG tablet TAKE 1 TABLET BY MOUTH AT BEDTIME  . Respiratory Therapy Supplies (FLUTTER) DEVI Use as directed  . Selenium 200 MCG CAPS Take 200  mcg by mouth daily.  . temazepam (RESTORIL) 15 MG capsule TAKE 2 CAPSULES BY MOUTH AT BEDTIME AS NEEDED FOR  SLEEP  . VICTOZA 18 MG/3ML SOPN INJECT 1.8MG  INTO THE SKIN DAILY  . vitamin B-12 (CYANOCOBALAMIN) 1000 MCG tablet Take 1,000 mcg by mouth daily.   No facility-administered encounter medications on file as of 11/24/2018.      Review of Systems  Review of Systems   Physical Exam  LMP 05/10/1992   Wt Readings from Last 5 Encounters:  10/24/18 242 lb (109.8 kg)  09/18/18 242 lb (109.8 kg)  08/31/18 242 lb (109.8 kg)  06/19/18 268 lb 3.2 oz (121.7 kg)  06/12/18 258 lb 14.4 oz (117.4 kg)     Physical Exam   Lab Results:  CBC    Component Value Date/Time   WBC 8.8 10/04/2018 1402   RBC 4.08 10/04/2018 1402   HGB 12.9 10/04/2018 1402   HCT 38.1 10/04/2018 1402   PLT 263.0 10/04/2018 1402   MCV 93.3 10/04/2018 1402   MCH 30.1 06/10/2018 0605   MCHC 33.8 10/04/2018 1402   RDW 14.9 10/04/2018 1402   LYMPHSABS 1.9 10/04/2018 1402   MONOABS 0.7 10/04/2018 1402   EOSABS 0.4 10/04/2018 1402   BASOSABS 0.1 10/04/2018 1402    BMET    Component Value Date/Time   NA 138 10/04/2018 1402   K 4.1 10/04/2018 1402   CL 100 10/04/2018 1402   CO2 30 10/04/2018 1402    GLUCOSE 98 10/04/2018 1402   BUN 12 10/04/2018 1402   CREATININE 0.58 10/04/2018 1402   CREATININE 0.75 06/05/2013 1558   CALCIUM 8.9 10/04/2018 1402   GFRNONAA 49 (L) 06/12/2018 0816   GFRAA 57 (L) 06/12/2018 0816    BNP No results found for: BNP  ProBNP    Component Value Date/Time   PROBNP 22.0 04/17/2009 1311      Assessment & Plan:   No problem-specific Assessment & Plan notes found for this encounter.    No follow-ups on file.   Lauraine Rinne, NP 11/23/2018   This appointment was *** minutes long with over 50% of the time in direct face-to-face patient care, assessment, plan of care, and follow-up.

## 2018-11-24 ENCOUNTER — Telehealth: Payer: Self-pay | Admitting: Gastroenterology

## 2018-11-24 ENCOUNTER — Ambulatory Visit: Payer: Medicare Other | Admitting: Pulmonary Disease

## 2018-11-24 NOTE — Telephone Encounter (Signed)
Patient called in wanting to speak with the nurse about scheduling a hospital colon

## 2018-11-27 ENCOUNTER — Other Ambulatory Visit: Payer: Self-pay

## 2018-11-27 DIAGNOSIS — Z86010 Personal history of colonic polyps: Secondary | ICD-10-CM

## 2018-11-27 NOTE — Telephone Encounter (Signed)
Scheduled for the colonoscopy as requested. Spoke with the patient.

## 2018-11-27 NOTE — Telephone Encounter (Signed)
Spouse advised of an 12/19/2018 date for the colonoscopy. Patient still needs her pre-visit and COVID19 screening date.

## 2018-12-01 ENCOUNTER — Other Ambulatory Visit: Payer: Self-pay | Admitting: Gastroenterology

## 2018-12-08 ENCOUNTER — Other Ambulatory Visit: Payer: Self-pay | Admitting: Gastroenterology

## 2018-12-08 ENCOUNTER — Telehealth: Payer: Self-pay | Admitting: Gastroenterology

## 2018-12-08 NOTE — Telephone Encounter (Signed)
Patient agrees to this plan. She is taking Citrucel tablets and will continue this.

## 2018-12-08 NOTE — Telephone Encounter (Signed)
Spoke with the patient. She is having bloating and at times urgency for bowel movements that refers to as her  "blow out spell" that she feels she has little control over. She states if she does not take her "white pill and the 2 blue ones" she is definitely going to experience a "blow out." Sometimes it will happen even when she remembers to take the medication. She does not have reflux, burping or indigestion. I did tell her the hospital did not allow enough time for both procedures and I would share her concerns with the doctor. Her colonoscopy which was rescheduled from January is now scheduled for 12/21/18.

## 2018-12-08 NOTE — Telephone Encounter (Signed)
Please advise patient to start Benefiber 1 tablespoon BID. Schedule office visit virtual or in person next available based on patient preference. Thanks

## 2018-12-11 ENCOUNTER — Other Ambulatory Visit: Payer: Self-pay

## 2018-12-11 ENCOUNTER — Ambulatory Visit: Payer: Medicare Other

## 2018-12-11 VITALS — Ht 72.0 in | Wt 245.0 lb

## 2018-12-11 DIAGNOSIS — Z86010 Personal history of colonic polyps: Secondary | ICD-10-CM

## 2018-12-11 MED ORDER — SUPREP BOWEL PREP KIT 17.5-3.13-1.6 GM/177ML PO SOLN: 1.0000 | Freq: Once | ORAL | 0 refills | Status: AC

## 2018-12-11 NOTE — Progress Notes (Signed)
Per pt, no allergies to soy or egg products.Pt not taking any weight loss meds or using  O2 at home. Pt denies any sedation problems. Pt refused emmi video.   The PV was done over the phone due to COVID-19. Verified pt's address and insurance with her. Reviewed pt's medical hx and prep instructions and will mail paperwork to pt. Informed pt to call with any questions or changes prior to her procedure. She understood.  Pt is scheduled for an OV on 01/17/2019 with Dr Silverio Decamp to discuss an endoscopy.

## 2018-12-14 ENCOUNTER — Other Ambulatory Visit: Payer: Self-pay | Admitting: Family Medicine

## 2018-12-15 ENCOUNTER — Other Ambulatory Visit (HOSPITAL_COMMUNITY)
Admission: RE | Admit: 2018-12-15 | Discharge: 2018-12-15 | Disposition: A | Discharge status: Home / Self Care | Payer: Medicare Other | Source: Ambulatory Visit | Attending: Gastroenterology | Admitting: Gastroenterology

## 2018-12-15 DIAGNOSIS — K635 Polyp of colon: Secondary | ICD-10-CM | POA: Insufficient documentation

## 2018-12-15 DIAGNOSIS — Z20828 Contact with and (suspected) exposure to other viral communicable diseases: Secondary | ICD-10-CM | POA: Diagnosis not present

## 2018-12-15 DIAGNOSIS — Z01812 Encounter for preprocedural laboratory examination: Secondary | ICD-10-CM | POA: Insufficient documentation

## 2018-12-15 LAB — SARS CORONAVIRUS 2 (TAT 6-24 HRS): SARS Coronavirus 2: NEGATIVE

## 2018-12-18 ENCOUNTER — Other Ambulatory Visit: Payer: Self-pay

## 2018-12-18 NOTE — Anesthesia Preprocedure Evaluation (Addendum)
Anesthesia Evaluation  Patient identified by MRN, date of birth, ID band Patient awake    Reviewed: Allergy & Precautions, NPO status , Patient's Chart, lab work & pertinent test results  History of Anesthesia Complications (+) PONV  Airway Mallampati: III  TM Distance: >3 FB Neck ROM: Full    Dental no notable dental hx. (+) Teeth Intact, Chipped,    Pulmonary asthma ,    Pulmonary exam normal breath sounds clear to auscultation       Cardiovascular hypertension, Pt. on medications and Pt. on home beta blockers Normal cardiovascular exam Rhythm:Regular Rate:Normal     Neuro/Psych PSYCHIATRIC DISORDERS Anxiety negative neurological ROS     GI/Hepatic   Endo/Other  diabetes, Type 2  Renal/GU      Musculoskeletal   Abdominal (+) + obese,   Peds  Hematology   Anesthesia Other Findings   Reproductive/Obstetrics                            Anesthesia Physical Anesthesia Plan  ASA: III  Anesthesia Plan: MAC   Post-op Pain Management:    Induction: Intravenous  PONV Risk Score and Plan: Treatment may vary due to age or medical condition and Ondansetron  Airway Management Planned: Natural Airway and Simple Face Mask  Additional Equipment:   Intra-op Plan:   Post-operative Plan:   Informed Consent: I have reviewed the patients History and Physical, chart, labs and discussed the procedure including the risks, benefits and alternatives for the proposed anesthesia with the patient or authorized representative who has indicated his/her understanding and acceptance.     Dental advisory given  Plan Discussed with: CRNA  Anesthesia Plan Comments: (Screening colonoscopy)       Anesthesia Quick Evaluation

## 2018-12-19 ENCOUNTER — Ambulatory Visit (HOSPITAL_COMMUNITY)
Admission: RE | Admit: 2018-12-19 | Discharge: 2018-12-19 | Disposition: A | Discharge status: Home / Self Care | Payer: Medicare Other | Attending: Gastroenterology | Admitting: Gastroenterology

## 2018-12-19 ENCOUNTER — Ambulatory Visit (HOSPITAL_COMMUNITY): Payer: Medicare Other | Admitting: Certified Registered"

## 2018-12-19 ENCOUNTER — Encounter (HOSPITAL_COMMUNITY)
Admission: RE | Disposition: A | Discharge status: Home / Self Care | Payer: Self-pay | Source: Home / Self Care | Attending: Gastroenterology

## 2018-12-19 ENCOUNTER — Encounter (HOSPITAL_COMMUNITY): Payer: Self-pay | Admitting: *Deleted

## 2018-12-19 DIAGNOSIS — Z860100 Personal history of colon polyps, unspecified: Secondary | ICD-10-CM

## 2018-12-19 DIAGNOSIS — H409 Unspecified glaucoma: Secondary | ICD-10-CM | POA: Diagnosis not present

## 2018-12-19 DIAGNOSIS — Z79899 Other long term (current) drug therapy: Secondary | ICD-10-CM | POA: Insufficient documentation

## 2018-12-19 DIAGNOSIS — F329 Major depressive disorder, single episode, unspecified: Secondary | ICD-10-CM | POA: Insufficient documentation

## 2018-12-19 DIAGNOSIS — Z1211 Encounter for screening for malignant neoplasm of colon: Secondary | ICD-10-CM | POA: Diagnosis not present

## 2018-12-19 DIAGNOSIS — D123 Benign neoplasm of transverse colon: Secondary | ICD-10-CM | POA: Diagnosis not present

## 2018-12-19 DIAGNOSIS — Z96653 Presence of artificial knee joint, bilateral: Secondary | ICD-10-CM | POA: Insufficient documentation

## 2018-12-19 DIAGNOSIS — Z7984 Long term (current) use of oral hypoglycemic drugs: Secondary | ICD-10-CM | POA: Diagnosis not present

## 2018-12-19 DIAGNOSIS — I10 Essential (primary) hypertension: Secondary | ICD-10-CM | POA: Insufficient documentation

## 2018-12-19 DIAGNOSIS — E559 Vitamin D deficiency, unspecified: Secondary | ICD-10-CM | POA: Insufficient documentation

## 2018-12-19 DIAGNOSIS — Z9013 Acquired absence of bilateral breasts and nipples: Secondary | ICD-10-CM | POA: Insufficient documentation

## 2018-12-19 DIAGNOSIS — Z09 Encounter for follow-up examination after completed treatment for conditions other than malignant neoplasm: Secondary | ICD-10-CM | POA: Insufficient documentation

## 2018-12-19 DIAGNOSIS — Z96612 Presence of left artificial shoulder joint: Secondary | ICD-10-CM | POA: Insufficient documentation

## 2018-12-19 DIAGNOSIS — K5730 Diverticulosis of large intestine without perforation or abscess without bleeding: Secondary | ICD-10-CM | POA: Insufficient documentation

## 2018-12-19 DIAGNOSIS — K635 Polyp of colon: Secondary | ICD-10-CM

## 2018-12-19 DIAGNOSIS — K648 Other hemorrhoids: Secondary | ICD-10-CM | POA: Diagnosis not present

## 2018-12-19 DIAGNOSIS — D120 Benign neoplasm of cecum: Secondary | ICD-10-CM

## 2018-12-19 DIAGNOSIS — Z853 Personal history of malignant neoplasm of breast: Secondary | ICD-10-CM | POA: Insufficient documentation

## 2018-12-19 DIAGNOSIS — E785 Hyperlipidemia, unspecified: Secondary | ICD-10-CM | POA: Insufficient documentation

## 2018-12-19 DIAGNOSIS — E1140 Type 2 diabetes mellitus with diabetic neuropathy, unspecified: Secondary | ICD-10-CM | POA: Insufficient documentation

## 2018-12-19 DIAGNOSIS — J45909 Unspecified asthma, uncomplicated: Secondary | ICD-10-CM | POA: Diagnosis not present

## 2018-12-19 DIAGNOSIS — F419 Anxiety disorder, unspecified: Secondary | ICD-10-CM | POA: Insufficient documentation

## 2018-12-19 DIAGNOSIS — K589 Irritable bowel syndrome without diarrhea: Secondary | ICD-10-CM | POA: Insufficient documentation

## 2018-12-19 DIAGNOSIS — Z7951 Long term (current) use of inhaled steroids: Secondary | ICD-10-CM | POA: Diagnosis not present

## 2018-12-19 HISTORY — PX: COLONOSCOPY WITH PROPOFOL: SHX5780

## 2018-12-19 HISTORY — PX: BIOPSY: SHX5522

## 2018-12-19 HISTORY — PX: POLYPECTOMY: SHX5525

## 2018-12-19 LAB — GLUCOSE, CAPILLARY: Glucose-Capillary: 100 mg/dL — ABNORMAL HIGH (ref 70–99)

## 2018-12-19 SURGERY — COLONOSCOPY WITH PROPOFOL
Anesthesia: Monitor Anesthesia Care

## 2018-12-19 MED ORDER — LIDOCAINE 2% (20 MG/ML) 5 ML SYRINGE
INTRAMUSCULAR | Status: DC | PRN
  Administered 2018-12-19: 60 mg via INTRAVENOUS

## 2018-12-19 MED ORDER — EPHEDRINE SULFATE-NACL 50-0.9 MG/10ML-% IV SOSY
PREFILLED_SYRINGE | INTRAVENOUS | Status: DC | PRN
  Administered 2018-12-19 (×3): 5 mg via INTRAVENOUS

## 2018-12-19 MED ORDER — PROPOFOL 500 MG/50ML IV EMUL
INTRAVENOUS | Status: DC | PRN
  Administered 2018-12-19: 100 ug/kg/min via INTRAVENOUS

## 2018-12-19 MED ORDER — PROPOFOL 10 MG/ML IV BOLUS
INTRAVENOUS | Status: DC | PRN
  Administered 2018-12-19: 10 mg via INTRAVENOUS

## 2018-12-19 MED ORDER — LACTATED RINGERS IV SOLN
INTRAVENOUS | Status: DC
  Administered 2018-12-19: 1000 mL via INTRAVENOUS

## 2018-12-19 MED ORDER — PHENYLEPHRINE 40 MCG/ML (10ML) SYRINGE FOR IV PUSH (FOR BLOOD PRESSURE SUPPORT)
PREFILLED_SYRINGE | INTRAVENOUS | Status: DC | PRN
  Administered 2018-12-19 (×2): 40 ug via INTRAVENOUS
  Administered 2018-12-19: 80 ug via INTRAVENOUS
  Administered 2018-12-19: 40 ug via INTRAVENOUS
  Administered 2018-12-19 (×2): 80 ug via INTRAVENOUS

## 2018-12-19 MED ORDER — PROPOFOL 10 MG/ML IV BOLUS
INTRAVENOUS | Status: AC
  Filled 2018-12-19: qty 60

## 2018-12-19 MED ORDER — SODIUM CHLORIDE 0.9 % IV SOLN: INTRAVENOUS | Status: DC

## 2018-12-19 SURGICAL SUPPLY — 21 items

## 2018-12-19 NOTE — Op Note (Signed)
[facility] Patient Name: [name] Procedure Date: 12/19/2018 MRN: [id number] Attending MD: Mauri Pole , MD Date of Birth: 14-Jan-1941 CSN: [id number] Age: 78 Admit Type: Outpatient Procedure:                Colonoscopy Indications:              High risk colon cancer surveillance: Personal                            history of colonic polyps, Surveillance: Piecemeal                            removal of large sessile adenoma last colonoscopy                            (< 3 yrs) Providers:                Mauri Pole, MD, Glori Bickers, RN, Elspeth Cho Tech., Technician, Jefm Miles, CRNA Referring MD:              Medicines:                Monitored Anesthesia Care Complications:            No immediate complications. Estimated Blood Loss:     Estimated blood loss was minimal. Procedure:                Pre-Anesthesia Assessment:                           - Prior to the procedure, a History and Physical                            was performed, and patient medications and                            allergies were reviewed. The patient's tolerance of                            previous anesthesia was also reviewed. The risks                            and benefits of the procedure and the sedation                            options and risks were discussed with the patient.                            All questions were answered, and informed consent                            was obtained. Prior Anticoagulants: The patient has  taken no previous anticoagulant or antiplatelet                            agents. ASA Grade Assessment: III - A patient with                            severe systemic disease. After reviewing the risks                            and benefits, the patient was deemed in                            satisfactory condition to undergo the procedure.  After obtaining informed consent, the colonoscope                            was passed under direct vision. Throughout the                            procedure, the patient's blood pressure, pulse, and                            oxygen saturations were monitored continuously. The                            PCF-[id number] ([id number]) Olympus pediatric colonscope                            was introduced through the anus and advanced to the                            the cecum, identified by appendiceal orifice and                            ileocecal valve. The colonoscopy was performed                            without difficulty. The patient tolerated the                            procedure well. The quality of the bowel                            preparation was excellent. The ileocecal valve,                            appendiceal orifice, and rectum were photographed. Scope In: 5:95:63 AM Scope Out: 10:04:21 AM Scope Withdrawal Time: 0 hours 15 minutes 28 seconds  Total Procedure Duration: 0 hours 17 minutes 24 seconds  Findings:      The perianal and digital rectal examinations were normal.      Three sessile polyps were found in the transverse colon and cecum. The       polyps were 4 to 7 mm in size. These polyps were removed with a cold  snare. Resection and retrieval were complete.      A 2 mm polyp was found in the transverse colon. The polyp was sessile.       The polyp was removed with a cold biopsy forceps. Resection and       retrieval were complete.      A few small and large-mouthed diverticula were found in the sigmoid       colon and descending colon.      Non-bleeding internal hemorrhoids were found during retroflexion. The       hemorrhoids were small.      The exam was otherwise without abnormality. Impression:               - Three 4 to 7 mm polyps in the transverse colon                            and in the cecum, removed with a cold snare.                             Resected and retrieved.                           - One 2 mm polyp in the transverse colon, removed                            with a cold biopsy forceps. Resected and retrieved.                           - Diverticulosis in the sigmoid colon and in the                            descending colon.                           - Non-bleeding internal hemorrhoids.                           - The examination was otherwise normal. Moderate Sedation:      Not Applicable - Patient had care per Anesthesia. Recommendation:           - Patient has a contact number available for                            emergencies. The signs and symptoms of potential                            delayed complications were discussed with the                            patient. Return to normal activities tomorrow.                            Written discharge instructions were provided to the                            patient.                           -  Resume previous diet.                           - Continue present medications.                           - Await pathology results.                           - No repeat colonoscopy due to age.                           - Return to GI clinic at the next available                            appointment. Procedure Code(s):        --- Professional ---                           [phone], Colonoscopy, flexible; with removal of                            tumor(s), polyp(s), or other lesion(s) by snare                            technique                           45380, 65, Colonoscopy, flexible; with biopsy,                            single or multiple Diagnosis Code(s):        --- Professional ---                           K63.5, Polyp of colon                           K64.8, Other hemorrhoids                           Z86.010, Personal history of colonic polyps                           K57.30, Diverticulosis of large intestine without                             perforation or abscess without bleeding CPT copyright 2019 American Medical Association. All rights reserved. The codes documented in this report are preliminary and upon coder review may  be revised to meet current compliance requirements. Mauri Pole, MD 12/19/2018 10:18:12 AM This report has been signed electronically. Number of Addenda: 0

## 2018-12-19 NOTE — Discharge Instructions (Signed)
YOU HAD AN ENDOSCOPIC PROCEDURE TODAY: Refer to the procedure report and other information in the discharge instructions given to you for any specific questions about what was found during the examination. If this information does not answer your questions, please call [place] office at [phone] to clarify.  ° °YOU SHOULD EXPECT: Some feelings of bloating in the abdomen. Passage of more gas than usual. Walking can help get rid of the air that was put into your GI tract during the procedure and reduce the bloating. If you had a lower endoscopy (such as a colonoscopy or flexible sigmoidoscopy) you may notice spotting of blood in your stool or on the toilet paper. Some abdominal soreness may be present for a day or two, also. ° °DIET: Your first meal following the procedure should be a light meal and then it is ok to progress to your normal diet. A half-sandwich or bowl of soup is an example of a good first meal. Heavy or fried foods are harder to digest and may make you feel nauseous or bloated. Drink plenty of fluids but you should avoid alcoholic beverages for 24 hours. If you had a esophageal dilation, please see attached instructions for diet.   ° °ACTIVITY: Your care partner should take you home directly after the procedure. You should plan to take it easy, moving slowly for the rest of the day. You can resume normal activity the day after the procedure however YOU SHOULD NOT DRIVE, use power tools, machinery or perform tasks that involve climbing or major physical exertion for 24 hours (because of the sedation medicines used during the test).  ° °SYMPTOMS TO REPORT IMMEDIATELY: °A gastroenterologist can be reached at any hour. Please call [phone]  for any of the following symptoms:  °Following lower endoscopy (colonoscopy, flexible sigmoidoscopy) °Excessive amounts of blood in the stool  °Significant tenderness, worsening of abdominal pains  °Swelling of the abdomen that is new, acute  °Fever of 100° or  higher  °Following upper endoscopy (EGD, EUS, ERCP, esophageal dilation) °Vomiting of blood or coffee ground material  °New, significant abdominal pain  °New, significant chest pain or pain under the shoulder blades  °Painful or persistently difficult swallowing  °New shortness of breath  °Black, tarry-looking or red, bloody stools ° °FOLLOW UP:  °If any biopsies were taken you will be contacted by phone or by letter within the next 1-3 weeks. Call [phone]  if you have not heard about the biopsies in 3 weeks.  °Please also call with any specific questions about appointments or follow up tests. ° °

## 2018-12-19 NOTE — Transfer of Care (Signed)
Immediate Anesthesia Transfer of Care Note  Patient: [name]  Procedure(s) Performed: COLONOSCOPY WITH PROPOFOL (N/A ) POLYPECTOMY  Patient Location: PACU and Endoscopy Unit  Anesthesia Type:MAC  Level of Consciousness: awake, alert  and oriented  Airway & Oxygen Therapy: Patient Spontanous Breathing and Patient connected to face mask oxygen  Post-op Assessment: Report given to RN and Post -op Vital signs reviewed and stable  Post vital signs: Reviewed and stable  Last Vitals:  Vitals Value Taken Time  BP 94/26 12/19/18 1012  Temp 36.3 C 12/19/18 1012  Pulse 93 12/19/18 1016  Resp 15 12/19/18 1016  SpO2 100 % 12/19/18 1016  Vitals shown include unvalidated device data.  Last Pain:  Vitals:   12/19/18 1012  TempSrc: Oral  PainSc: 0-No pain         Complications: No apparent anesthesia complications

## 2018-12-19 NOTE — Anesthesia Postprocedure Evaluation (Signed)
Anesthesia Post Note  Patient: [name]  Procedure(s) Performed: COLONOSCOPY WITH PROPOFOL (N/A ) POLYPECTOMY     Patient location during evaluation: Endoscopy Anesthesia Type: MAC Level of consciousness: awake and alert Pain management: pain level controlled Vital Signs Assessment: post-procedure vital signs reviewed and stable Respiratory status: spontaneous breathing, nonlabored ventilation, respiratory function stable and patient connected to nasal cannula oxygen Cardiovascular status: blood pressure returned to baseline and stable Postop Assessment: no apparent nausea or vomiting Anesthetic complications: no    Last Vitals:  Vitals:   12/19/18 1030 12/19/18 1031  BP: (!) 112/37 (!) 112/37  Pulse: 89 88  Resp: (!) 22 19  Temp:    SpO2: 95% 95%    Last Pain:  Vitals:   12/19/18 1031  TempSrc:   PainSc: 0-No pain                 Barnet Glasgow

## 2018-12-19 NOTE — H&P (Signed)
[facility] History and Physical   Primary Care Physician:  Marin Olp, MD   Reason for Procedure:  H/o large adenomatous polyp, piecemeal removal  Plan:    Colonoscopy with possible polypectomy and intervention if needed     HPI: [name] is a 78 y.o. female with multiple co-morbidities here for surveillance colonoscopy.  The risks and benefits as well as alternatives of endoscopic procedure(s) have been discussed and reviewed. All questions answered. The patient agrees to proceed.    Past Medical History:  Diagnosis Date  . Allergy   . Anemia   . Anxiety   . Arthritis    right knee;injections every 105months   . Asthma   . Bronchiectasis ([place])   . Cancer ([place]) 1994/1995   HX BREAST CANCER/ right brreast and left breast in 1995  . COMPRESSION FRACTURE, LUMBAR VERTEBRAE 08/21/2008   Qualifier: Diagnosis of  By: Arnoldo Morale MD, Balinda Quails   . Depression   . Diabetes mellitus    takes Amaryl and Januvia daily  . Difficulty sleeping   . Diverticulitis   . Early cataracts, bilateral   . Eczema   . Endometriosis   . Gastritis   . Glaucoma   . Hammer toe   . Headache(784.0)    Migraines  . Hyperlipidemia associated with type 2 diabetes mellitus ([place]) 07/26/2007   Diet/exercise control.    . Hypertension    takes Atenolol daily  . IBS (irritable bowel syndrome)   . Joint pain   . Kidney stone   . Neuropathy   . Open wound of second toe of left foot in past   at pre-op appt, wound appears clear of infection 1 month post removal of toenail, no obvious exudate present nor any redness,  . Osteomyelitis ([place])    left 2nd toe  . Osteopenia   . PONV (postoperative nausea and vomiting)   . Posterior tibial tendon dysfunction    left foot  . Scoliosis   . Severe esophageal dysplasia   . 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 Thyroid disease   . Vitamin D deficiency    takes Vit d daily  . Weakness    uses a  walker and wheelchair    Past Surgical History:  Procedure Laterality Date  . ADENOIDECTOMY     at age 63  . CATARACT EXTRACTION    . CHOLECYSTECTOMY  06/2008  . COLONOSCOPY WITH PROPOFOL N/A 05/17/2017   Procedure: COLONOSCOPY WITH PROPOFOL;  Surgeon: Mauri Pole, MD;  Location: [facility];  Service: Endoscopy;  Laterality: N/A;  PT WILL BE ADMITTED THE DAY BEFORE FOR PREP PER ROBIN KB  . DILATION AND CURETTAGE OF UTERUS    . excision on breast     internal infected suture from breast surgery  . excision removed from neck     infected lymph node  . FOOT SURGERY     left foot-cleaning out areas on toes at the wound center-having MRI to determine if osteomyelitis  . HYSTEROSCOPY  06/22/11   PMB submucosal myoma  . JOINT REPLACEMENT     left total shoulder  . LAPAROSCOPIC OOPHORECTOMY Right 12/2004   absent LSO  . LAPAROTOMY    . MASTECTOMY Bilateral 1994 right, 1995 left  . SHOULDER HEMI-ARTHROPLASTY  01/01/2012   Procedure: SHOULDER HEMI-ARTHROPLASTY;  Surgeon: Roseanne Kaufman, MD;  Location: [facility];  Service: Orthopedics;  Laterality: Left;  Left Shoulder Hemi Arthroplasty with Repair  and Reconstruction as Necessary   . THYROIDECTOMY     61yrs ago. follows endocrine  . TONSILLECTOMY    . TOTAL KNEE ARTHROPLASTY Right 12/30/2014   Procedure: RIGHT TOTAL KNEE ARTHROPLASTY;  Surgeon: Gaynelle Arabian, MD;  Location: [facility];  Service: Orthopedics;  Laterality: Right;  . TOTAL KNEE ARTHROPLASTY Left 11/29/2016   Procedure: LEFT TOTAL KNEE ARTHROPLASTY;  Surgeon: Gaynelle Arabian, MD;  Location: [facility];  Service: Orthopedics;  Laterality: Left;    Prior to Admission medications   Medication Sig Start Date End Date Taking? Authorizing Provider  atenolol (TENORMIN) 25 MG tablet TAKE 1 TABLET BY MOUTH ONCE DAILY Patient taking differently: Take 25 mg by mouth daily.  03/29/18  Yes Marin Olp, MD  Bacillus Coagulans-Inulin (ALIGN PREBIOTIC-PROBIOTIC PO) Take 1 capsule by mouth  daily.    Yes [provider]  budesonide-formoterol (SYMBICORT) 160-4.5 MCG/ACT inhaler Inhale 2 puffs into the lungs 2 (two) times daily.   Yes [provider]  cetirizine (ZYRTEC) 10 MG tablet Take 10 mg by mouth daily.   Yes [provider]  Cholecalciferol (VITAMIN D3) 125 MCG (5000 UT) TABS Take 5,000 Units by mouth daily.   Yes [provider]  Coenzyme Q10 (COQ10) 200 MG CAPS Take 200 mg by mouth daily.   Yes [provider]  colesevelam (WELCHOL) 625 MG tablet TAKE 1 TABLET BY MOUTH THREE TIMES DAILY Patient taking differently: Take 625 mg by mouth See admin instructions. Up to 3 times daily 12/08/18  Yes Zachry Hopfensperger, Venia Minks, MD  dicyclomine (BENTYL) 10 MG capsule TAKE 2 CAPSULES BY MOUTH 4 TIMES DAILY AS NEEDED FOR SPASM Patient taking differently: Take 20 mg by mouth See admin instructions. Take 2 capsules (20 mg) by mouth with each meal & 2 capsules (20 mg) at bedtime. 12/01/18  Yes Nansi Birmingham, Venia Minks, MD  dorzolamide-timolol (COSOPT) 22.3-6.8 MG/ML ophthalmic solution Place 1 drop into both eyes 2 (two) times daily.  02/08/13  Yes [provider]  ESTRING 2 MG vaginal ring INSERT 1 RING  INTO VAGINA EVERY 3 MONTHS 11/21/18  Yes Megan Salon, MD  FLUoxetine (PROZAC) 40 MG capsule TAKE 1 CAPSULE BY MOUTH ONCE DAILY. Patient taking differently: Take 40 mg by mouth daily.  04/19/18  Yes Marin Olp, MD  glimepiride (AMARYL) 4 MG tablet Take 2 tablets (8 mg total) by mouth daily with breakfast. 10/17/18  Yes Marin Olp, MD  glucose blood (FREESTYLE LITE) test strip USE TO CHECK BLOOD SUGAR EVERY MORNING E11.9 05/17/18  Yes Marin Olp, MD  latanoprost (XALATAN) 0.005 % ophthalmic solution Place 1 drop into both eyes at bedtime. 05/20/18  Yes [provider]  liraglutide (VICTOZA) 18 MG/3ML SOPN Inject 0.3 mLs (1.8 mg total) into the skin daily. 12/15/18  Yes Marin Olp, MD  lisinopril (ZESTRIL) 10 MG tablet Take  1 tablet (10 mg total) by mouth daily. NO PRINT ORDER- to add back to medication list Patient taking differently: Take 10 mg by mouth at bedtime. NO PRINT ORDER- to add back to medication list 08/31/18  Yes Marin Olp, MD  Methylcellulose, Laxative, (CITRUCEL PO) Take 3 tablets by mouth at bedtime.   Yes [provider]  Multiple Vitamin (MULTIVITAMIN WITH MINERALS) TABS tablet Take 1 tablet by mouth daily. Centrum Silver   Yes [provider]  pramipexole (MIRAPEX) 0.5 MG tablet TAKE 1 TABLET BY MOUTH AT BEDTIME Patient taking differently: Take 0.5 mg by mouth at bedtime.  10/26/18  Yes Marin Olp, MD  Respiratory Therapy Supplies (FLUTTER) DEVI Use as directed 01/08/16  Yes Juanito Doom, MD  Resveratrol 250 MG CAPS Take 250 mg by mouth daily.   Yes [provider]  Selenium 200 MCG CAPS Take 200 mcg by mouth at bedtime.    Yes [provider]  temazepam (RESTORIL) 15 MG capsule TAKE 2 CAPSULES BY MOUTH AT BEDTIME AS NEEDED FOR  SLEEP Patient taking differently: Take 30 mg by mouth at bedtime. TAKE 2 CAPSULES BY MOUTH AT BEDTIME AS NEEDED FOR  SLEEP 07/27/18  Yes Marin Olp, MD  vitamin B-12 (CYANOCOBALAMIN) 1000 MCG tablet Take 1,000 mcg by mouth daily.   Yes [provider]  ALPRAZolam (XANAX) 0.25 MG tablet Take 1 tablet (0.25 mg total) by mouth daily as needed for anxiety. 04/28/18   Megan Salon, MD  hydrocortisone 2.5 % cream Apply 1 application topically 2 (two) times daily as needed (skin irritation/rash).  08/10/18   [provider]  levalbuterol Penne Lash) 0.63 MG/3ML nebulizer solution Take 3 mLs (0.63 mg total) by nebulization every 6 (six) hours as needed for wheezing or shortness of breath. Patient not taking: Reported on 12/11/2018 04/11/18   Lauraine Rinne, NP    Current Facility-Administered Medications  Medication Dose Route Frequency Provider Last Rate Last Dose  . 0.9 %  sodium chloride infusion    Intravenous Continuous Neils Siracusa V, MD      . lactated ringers infusion   Intravenous Continuous Mauri Pole, MD 20 mL/hr at 12/19/18 0902      Allergies as of 11/27/2018 - Review Complete 11/08/2018  Allergen Reaction Noted  . Nitrofurantoin Other (See Comments) 10/14/2006  . Brimonidine Other (See Comments) 08/02/2012  . Cephalexin Diarrhea and Other (See Comments) 10/14/2006  . Erythromycin ethylsuccinate Hives and Diarrhea 10/14/2006  . Levaquin [levofloxacin in d5w] Other (See Comments) 12/18/2014  . Levofloxacin Other (See Comments) 02/13/2015  . Percocet [oxycodone-acetaminophen] Itching 12/31/2011    Family History  Problem Relation Age of Onset  . Arthritis Mother   . COPD Father   . Heart disease Father        MI 58 - states he died of lockjaw  . Hypertension Father   . Hyperlipidemia Father   . Pancreatic cancer Paternal Grandfather   . Colon cancer Neg Hx   . Esophageal cancer Neg Hx   . Rectal cancer Neg Hx   . Stomach cancer Neg Hx     Social History   Socioeconomic History  . Marital status: Married    Spouse name: Not on file  . Number of children: Not on file  . Years of education: Not on file  . Highest education level: Not on file  Occupational History  . Not on file  Social Needs  . Financial resource strain: Not on file  . Food insecurity    Worry: Not on file    Inability: Not on file  . Transportation needs    Medical: Not on file    Non-medical: Not on file  Tobacco Use  . Smoking status: Never Smoker  . Smokeless tobacco: Never Used  Substance and Sexual Activity  . Alcohol use: No  . Drug use: No  . Sexual activity: Not on file  Lifestyle  . Physical activity    Days per week: Not on file    Minutes per session: Not on file  . Stress: Not on file  Relationships  . Social connections  Talks on phone: Not on file    Gets together: Not on file    Attends religious service: Not on file    Active member of club or  organization: Not on file    Attends meetings of clubs or organizations: Not on file    Relationship status: Not on file  . Intimate partner violence    Fear of current or ex partner: Not on file    Emotionally abused: Not on file    Physically abused: Not on file    Forced sexual activity: Not on file  Other Topics Concern  . Not on file  Social History Narrative   Married 39 years in 2015. 1 adopted son. 1 grandkid (32 yo grandson)      Retired from Development worker, international aid at [employer]: fashion, tv shopping, sewing, decorate    Review of Systems:  All other review of systems negative except as mentioned in the HPI.  Physical Exam: Vital signs in last 24 hours: Temp:  [97.5 F (36.4 C)] 97.5 F (36.4 C) (08/11 0858) Pulse Rate:  [95] 95 (08/11 0858) Resp:  [18] 18 (08/11 0858) BP: (139)/(56) 139/56 (08/11 0858) SpO2:  [95 %] 95 % (08/11 0858) Weight:  [111.1 kg] 111.1 kg (08/11 0858)   General:   Alert,  obese, pleasant and cooperative in NAD Lungs:  Clear throughout to auscultation.   Heart:  Regular rate and rhythm; no murmurs, clicks, rubs,  or gallops. Abdomen:  Soft, nontender and nondistended. Normal bowel sounds.   Neuro/Psych:  Alert and cooperative. Normal mood and affect. A and O x 3   K. Denzil Magnuson , MD [phone]

## 2018-12-20 ENCOUNTER — Encounter: Payer: Self-pay | Admitting: Family Medicine

## 2018-12-20 DIAGNOSIS — D369 Benign neoplasm, unspecified site: Secondary | ICD-10-CM | POA: Insufficient documentation

## 2019-01-01 ENCOUNTER — Encounter: Payer: Self-pay | Admitting: Gastroenterology

## 2019-01-02 ENCOUNTER — Other Ambulatory Visit: Payer: Self-pay | Admitting: Gastroenterology

## 2019-01-16 ENCOUNTER — Other Ambulatory Visit: Payer: Self-pay | Admitting: Family Medicine

## 2019-01-17 ENCOUNTER — Ambulatory Visit (INDEPENDENT_AMBULATORY_CARE_PROVIDER_SITE_OTHER): Payer: Medicare Other | Admitting: Gastroenterology

## 2019-01-17 ENCOUNTER — Encounter: Payer: Self-pay | Admitting: Gastroenterology

## 2019-01-17 VITALS — BP 102/54 | HR 84 | Ht 72.0 in | Wt 245.0 lb

## 2019-01-17 DIAGNOSIS — K219 Gastro-esophageal reflux disease without esophagitis: Secondary | ICD-10-CM

## 2019-01-17 DIAGNOSIS — Z86010 Personal history of colonic polyps: Secondary | ICD-10-CM | POA: Diagnosis not present

## 2019-01-17 DIAGNOSIS — K580 Irritable bowel syndrome with diarrhea: Secondary | ICD-10-CM | POA: Diagnosis not present

## 2019-01-17 NOTE — Patient Instructions (Signed)
If you are age 79 or older, your body mass index should be between 23-30. Your Body mass index is 33.23 kg/m. If this is out of the aforementioned range listed, please consider follow up with your Primary Care Provider.  If you are age 4 or younger, your body mass index should be between 19-25. Your Body mass index is 33.23 kg/m. If this is out of the aformentioned range listed, please consider follow up with your Primary Care Provider.   We have given you samples of the following medication to take: [place]rum which are both available over the counter at your pharmacy.  Continue with Welchol and dicyclomine as needed.  Thank you for choosing me and [facility]  Pollie Meyer

## 2019-01-17 NOTE — Progress Notes (Signed)
[name]    EU:[id number]    March 02, 1941  Primary Care Physician:Hunter, Brayton Mars, MD  Referring Physician: Marin Olp, MD [place],  [place] [address]   Chief complaint: IBS D HPI:  78 year old female with history of obesity, type 2 diabetes, hypertension, irritable bowel syndrome predominant diarrhea here for follow-up visit Patient wanted to discuss if she should get an EGD before she is aged out and insurance will deny the procedure.  Denies any dysphagia, odynophagia, nausea, vomiting or abdominal pain. She has intermittent heartburn and regurgitation, maybe once every 2 weeks usually when she eats tomato based sauce or heavy meals.  She does not take anything as it self-limiting and does not bother her much.  IBS symptoms are under control, occasional episodes mostly related to diet.  She is taking dicyclomine as needed and WelChol twice daily.  Colonoscopy December 19, 2018: 4 sessile polyps [tubular adenomas] removed, 2 to 7 mm in size, sigmoid and descending colon diverticulosis and internal hemorrhoids.   Outpatient Encounter Medications as of 01/17/2019  Medication Sig  . glimepiride (AMARYL) 2 MG tablet Take 4 mg by mouth daily with breakfast.  . ALPRAZolam (XANAX) 0.25 MG tablet Take 1 tablet (0.25 mg total) by mouth daily as needed for anxiety.  [name] atenolol (TENORMIN) 25 MG tablet TAKE 1 TABLET BY MOUTH ONCE DAILY (Patient taking differently: Take 25 mg by mouth daily. )  . Bacillus Coagulans-Inulin (ALIGN PREBIOTIC-PROBIOTIC PO) Take 1 capsule by mouth daily.   . budesonide-formoterol (SYMBICORT) 160-4.5 MCG/ACT inhaler Inhale 2 puffs into the lungs 2 (two) times daily.  . cetirizine (ZYRTEC) 10 MG tablet Take 10 mg by mouth daily.  . Cholecalciferol (VITAMIN D3) 125 MCG (5000 UT) TABS Take 5,000 Units by mouth daily.  . Coenzyme Q10 (COQ10) 200 MG CAPS Take 200 mg by mouth daily.  . colesevelam (WELCHOL) 625 MG tablet TAKE 1 TABLET  BY MOUTH THREE TIMES DAILY (Patient taking differently: Take 625 mg by mouth See admin instructions. Up to 3 times daily)  . dicyclomine (BENTYL) 10 MG capsule TAKE 2 CAPSULES BY MOUTH 4 TIMES DAILY AS NEEDED FOR  SPASMS  . dorzolamide-timolol (COSOPT) 22.3-6.8 MG/ML ophthalmic solution Place 1 drop into both eyes 2 (two) times daily.   [name] ESTRING 2 MG vaginal ring INSERT 1 RING  INTO VAGINA EVERY 3 MONTHS  . FLUoxetine (PROZAC) 40 MG capsule TAKE 1 CAPSULE BY MOUTH ONCE DAILY. (Patient taking differently: Take 40 mg by mouth daily. )  . glucose blood (FREESTYLE LITE) test strip USE TO CHECK BLOOD SUGAR EVERY MORNING E11.9  . hydrocortisone 2.5 % cream Apply 1 application topically 2 (two) times daily as needed (skin irritation/rash).   [name] latanoprost (XALATAN) 0.005 % ophthalmic solution Place 1 drop into both eyes at bedtime.  [name] lisinopril (ZESTRIL) 10 MG tablet Take 1 tablet (10 mg total) by mouth daily. NO PRINT ORDER- to add back to medication list (Patient taking differently: Take 10 mg by mouth at bedtime. NO PRINT ORDER- to add back to medication list)  . Methylcellulose, Laxative, (CITRUCEL PO) Take 3 tablets by mouth at bedtime.  . Multiple Vitamin (MULTIVITAMIN WITH MINERALS) TABS tablet Take 1 tablet by mouth daily. Centrum Silver  . pramipexole (MIRAPEX) 0.5 MG tablet TAKE 1 TABLET BY MOUTH AT BEDTIME (Patient taking differently: Take 0.5 mg by mouth at bedtime. )  . Respiratory Therapy Supplies (FLUTTER) DEVI Use as directed  .  Resveratrol 250 MG CAPS Take 250 mg by mouth daily.  . Selenium 200 MCG CAPS Take 200 mcg by mouth at bedtime.   . temazepam (RESTORIL) 15 MG capsule TAKE 2 CAPSULES BY MOUTH AT BEDTIME AS NEEDED FOR  SLEEP (Patient taking differently: Take 30 mg by mouth at bedtime. TAKE 2 CAPSULES BY MOUTH AT BEDTIME AS NEEDED FOR  SLEEP)  . VICTOZA 18 MG/3ML SOPN INJECT 0.3 ML (1.8 MG)  INTO THE SKIN DAILY  . vitamin B-12 (CYANOCOBALAMIN) 1000 MCG tablet Take 1,000 mcg by mouth  daily.  . [DISCONTINUED] glimepiride (AMARYL) 4 MG tablet Take 2 tablets (8 mg total) by mouth daily with breakfast.   No facility-administered encounter medications on file as of 01/17/2019.     Allergies as of 01/17/2019 - Review Complete 01/17/2019  Allergen Reaction Noted  . Nitrofurantoin Other (See Comments) 10/14/2006  . Levaquin [levofloxacin in d5w] Other (See Comments) 12/18/2014  . Brimonidine Other (See Comments) 08/02/2012  . Cephalexin Diarrhea and Other (See Comments) 10/14/2006  . Erythromycin ethylsuccinate Hives and Diarrhea 10/14/2006  . Percocet [oxycodone-acetaminophen] Itching 12/31/2011    Past Medical History:  Diagnosis Date  . Allergy   . Anemia   . Anxiety   . Arthritis    right knee;injections every 62months   . Asthma   . Bronchiectasis ([place])   . Cancer ([place]) 1994/1995   HX BREAST CANCER/ right brreast and left breast in 1995  . COMPRESSION FRACTURE, LUMBAR VERTEBRAE 08/21/2008   Qualifier: Diagnosis of  By: Arnoldo Morale MD, Balinda Quails   . Depression   . Diabetes mellitus    takes Amaryl and Januvia daily  . Difficulty sleeping   . Diverticulitis   . Early cataracts, bilateral   . Eczema   . Endometriosis   . Gastritis   . Glaucoma   . Hammer toe   . Headache(784.0)    Migraines  . Hyperlipidemia associated with type 2 diabetes mellitus ([place]) 07/26/2007   Diet/exercise control.    . Hypertension    takes Atenolol daily  . IBS (irritable bowel syndrome)   . Joint pain   . Kidney stone   . Neuropathy   . Open wound of second toe of left foot in past   at pre-op appt, wound appears clear of infection 1 month post removal of toenail, no obvious exudate present nor any redness,  . Osteomyelitis ([place])    left 2nd toe  . Osteopenia   . PONV (postoperative nausea and vomiting)   . Posterior tibial tendon dysfunction    left foot  . Scoliosis   . Severe esophageal dysplasia   . 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 Thyroid disease   . Vitamin D deficiency    takes Vit d daily  . Weakness    uses a walker and wheelchair    Past Surgical History:  Procedure Laterality Date  . ADENOIDECTOMY     at age 39  . BIOPSY  12/19/2018   Procedure: BIOPSY;  Surgeon: Mauri Pole, MD;  Location: [facility];  Service: Endoscopy;;  . CATARACT EXTRACTION    . CHOLECYSTECTOMY  06/2008  . COLONOSCOPY WITH PROPOFOL N/A 05/17/2017   Procedure: COLONOSCOPY WITH PROPOFOL;  Surgeon: Mauri Pole, MD;  Location: [facility];  Service: Endoscopy;  Laterality: N/A;  PT WILL BE ADMITTED THE DAY BEFORE FOR PREP PER ROBIN KB  . COLONOSCOPY WITH PROPOFOL N/A 12/19/2018   Procedure: COLONOSCOPY  WITH PROPOFOL;  Surgeon: Mauri Pole, MD;  Location: [facility];  Service: Endoscopy;  Laterality: N/A;  . DILATION AND CURETTAGE OF UTERUS    . excision on breast     internal infected suture from breast surgery  . excision removed from neck     infected lymph node  . FOOT SURGERY     left foot-cleaning out areas on toes at the wound center-having MRI to determine if osteomyelitis  . HYSTEROSCOPY  06/22/11   PMB submucosal myoma  . JOINT REPLACEMENT     left total shoulder  . LAPAROSCOPIC OOPHORECTOMY Right 12/2004   absent LSO  . LAPAROTOMY    . MASTECTOMY Bilateral 1994 right, 1995 left  . POLYPECTOMY  12/19/2018   Procedure: POLYPECTOMY;  Surgeon: Mauri Pole, MD;  Location: [facility];  Service: Endoscopy;;  . SHOULDER HEMI-ARTHROPLASTY  01/01/2012   Procedure: SHOULDER HEMI-ARTHROPLASTY;  Surgeon: Roseanne Kaufman, MD;  Location: [facility];  Service: Orthopedics;  Laterality: Left;  Left Shoulder Hemi Arthroplasty with Repair and Reconstruction as Necessary   . THYROIDECTOMY     29yrs ago. follows endocrine  . TONSILLECTOMY    . TOTAL KNEE ARTHROPLASTY Right 12/30/2014   Procedure: RIGHT TOTAL KNEE ARTHROPLASTY;  Surgeon: Gaynelle Arabian, MD;  Location: [facility];  Service: Orthopedics;  Laterality:  Right;  . TOTAL KNEE ARTHROPLASTY Left 11/29/2016   Procedure: LEFT TOTAL KNEE ARTHROPLASTY;  Surgeon: Gaynelle Arabian, MD;  Location: [facility];  Service: Orthopedics;  Laterality: Left;    Family History  Problem Relation Age of Onset  . Arthritis Mother   . COPD Father   . Heart disease Father        MI 82 - states he died of lockjaw  . Hypertension Father   . Hyperlipidemia Father   . Pancreatic cancer Paternal Grandfather   . Colon cancer Neg Hx   . Esophageal cancer Neg Hx   . Rectal cancer Neg Hx   . Stomach cancer Neg Hx     Social History   Socioeconomic History  . Marital status: Married    Spouse name: Not on file  . Number of children: Not on file  . Years of education: Not on file  . Highest education level: Not on file  Occupational History  . Not on file  Social Needs  . Financial resource strain: Not on file  . Food insecurity    Worry: Not on file    Inability: Not on file  . Transportation needs    Medical: Not on file    Non-medical: Not on file  Tobacco Use  . Smoking status: Never Smoker  . Smokeless tobacco: Never Used  Substance and Sexual Activity  . Alcohol use: No  . Drug use: No  . Sexual activity: Not on file  Lifestyle  . Physical activity    Days per week: Not on file    Minutes per session: Not on file  . Stress: Not on file  Relationships  . Social Herbalist on phone: Not on file    Gets together: Not on file    Attends religious service: Not on file    Active member of club or organization: Not on file    Attends meetings of clubs or organizations: Not on file    Relationship status: Not on file  . Intimate partner violence    Fear of current or ex partner: Not on file    Emotionally abused: Not on file  Physically abused: Not on file    Forced sexual activity: Not on file  Other Topics Concern  . Not on file  Social History Narrative   Married 39 years in 2015. 1 adopted son. 1 grandkid (61 yo grandson)       Retired from Development worker, international aid at [employer]: fashion, tv shopping, sewing, decorate      Review of systems: Review of Systems  Constitutional: Negative for fever and chills.  HENT: Negative.   Eyes: Negative for blurred vision.  Respiratory: Negative for cough, shortness of breath and wheezing.   Cardiovascular: Negative for chest pain and palpitations.  Gastrointestinal: as per HPI Genitourinary: Negative for dysuria, urgency, frequency and hematuria.  Musculoskeletal: Negative for myalgias, back pain and joint pain.  Skin: Negative for itching and rash.  Neurological: Negative for dizziness, tremors, focal weakness, seizures and loss of consciousness.  Endo/Heme/Allergies: Positive for seasonal allergies.  Psychiatric/Behavioral: Negative for depression, suicidal ideas and hallucinations.  All other systems reviewed and are negative.   Physical Exam: Vitals:   01/17/19 1459  BP: (!) 102/54  Pulse: 84   Body mass index is 33.23 kg/m. Gen:      No acute distress HEENT:  EOMI, sclera anicteric Neck:     No masses; no thyromegaly Lungs:    Clear to auscultation bilaterally; normal respiratory effort CV:         Regular rate and rhythm; no murmurs Abd:      + bowel sounds; soft, non-tender; no palpable masses, no distension Ext:    No edema; adequate peripheral perfusion Skin:      Warm and dry; no rash Neuro: alert and oriented x 3 Psych: normal mood and affect  Data Reviewed:  Reviewed labs, radiology imaging, old records and pertinent past GI work up   Assessment and Plan/Recommendations:  78 year old female with history of obesity, hypertension, diabetes, hyperlipidemia, fatty liver here for follow-up visit  Irritable bowel syndrome predominant diarrhea: Continue WelChol Continue dicyclomine 10 mg 3 times daily as needed Lactose-free diet Continue align  GERD with heartburn and regurgitation less than 2 or 3 times a month Okay to use over-the-counter antacids  Gaviscon as needed Discussed antireflux measures Trial of FD guard as needed We will hold off EGD given she does not have any critical symptoms concerning for high risk lesion  History of adenomatous colon polyps, given her age will hold off surveillance colonoscopy  Return in 6 months or sooner if needed  25 minutes was spent face-to-face with the patient. Greater than 50% of the time used for counseling as well as treatment plan and follow-up. She had multiple questions which were answered to her satisfaction  K. Denzil Magnuson , MD    CC: Marin Olp, MD

## 2019-01-18 ENCOUNTER — Encounter: Payer: Self-pay | Admitting: Gastroenterology

## 2019-01-21 ENCOUNTER — Other Ambulatory Visit: Payer: Self-pay | Admitting: Gastroenterology

## 2019-01-25 ENCOUNTER — Ambulatory Visit: Payer: Medicare Other

## 2019-01-26 ENCOUNTER — Other Ambulatory Visit: Payer: Self-pay | Admitting: Family Medicine

## 2019-01-26 ENCOUNTER — Other Ambulatory Visit: Payer: Self-pay | Admitting: Gastroenterology

## 2019-01-28 ENCOUNTER — Other Ambulatory Visit: Payer: Self-pay | Admitting: Gastroenterology

## 2019-01-29 NOTE — Telephone Encounter (Signed)
Last OV 10/30/18 Last med check 09/18/18 Last refill 07/27/18 #60/5 Next OV 01/30/19  Forwarding to Dr. Yong Channel

## 2019-01-29 NOTE — Telephone Encounter (Signed)
Pt called to check on the status of this.  States she is completely out and can not sleep without it.

## 2019-01-29 NOTE — Telephone Encounter (Signed)
See note

## 2019-01-29 NOTE — Telephone Encounter (Signed)
Please load for refill consideration at her visit on the 22nd

## 2019-01-30 ENCOUNTER — Encounter: Payer: Self-pay | Admitting: Family Medicine

## 2019-01-30 ENCOUNTER — Ambulatory Visit (INDEPENDENT_AMBULATORY_CARE_PROVIDER_SITE_OTHER): Payer: Medicare Other | Admitting: Family Medicine

## 2019-01-30 VITALS — BP 121/63 | Temp 98.3°F | Wt 248.0 lb

## 2019-01-30 DIAGNOSIS — E119 Type 2 diabetes mellitus without complications: Secondary | ICD-10-CM

## 2019-01-30 DIAGNOSIS — E1169 Type 2 diabetes mellitus with other specified complication: Secondary | ICD-10-CM

## 2019-01-30 DIAGNOSIS — D518 Other vitamin B12 deficiency anemias: Secondary | ICD-10-CM

## 2019-01-30 DIAGNOSIS — E785 Hyperlipidemia, unspecified: Secondary | ICD-10-CM | POA: Diagnosis not present

## 2019-01-30 DIAGNOSIS — I10 Essential (primary) hypertension: Secondary | ICD-10-CM

## 2019-01-30 DIAGNOSIS — J029 Acute pharyngitis, unspecified: Secondary | ICD-10-CM

## 2019-01-30 MED ORDER — TEMAZEPAM 15 MG PO CAPS: ORAL_CAPSULE | ORAL | 5 refills | Status: DC

## 2019-01-30 MED ORDER — ROSUVASTATIN CALCIUM 5 MG PO TABS: 5.0000 mg | ORAL_TABLET | ORAL | 3 refills | Status: DC

## 2019-01-30 MED ORDER — GLIMEPIRIDE 4 MG PO TABS: 8.0000 mg | ORAL_TABLET | Freq: Every day | ORAL | 3 refills | Status: DC

## 2019-01-30 NOTE — Telephone Encounter (Signed)
[facility] please contact patient to schedule acute appointment with another provider  Clinical team, please be advised on medication refill  Copied from [place] [phone]. Topic: General - Other >> Jan 30, 2019  9:33 AM Leward Quan A wrote: Reason for CRM: Patient called to inform Dr Yong Channel that she woke up this morning with a flare up of IBSD and also a sore throat she tried to get the appointment for this afternoon rescheduled but first available was 03/09/2019 and she did not want to schedule out that far. Asking for Dr Ronney Lion nurse to please work her in for an earlier date. She also want Dr Yong Channel to know she is completely out of temazepam (RESTORIL) 15 MG capsule and need an Rx sent to the pharmacy today. Please call patient to inform. Can be reached at Ph#  [phone]

## 2019-01-30 NOTE — Patient Instructions (Addendum)
Health Maintenance Due  Topic Date Due  . TETANUS/TDAP  12/24/2017  . OPHTHALMOLOGY EXAM - needs to schedule 09/03/2018  . INFLUENZA VACCINE -needs to have done 12/09/2018  . FOOT EXAM -done 01/20/2019

## 2019-01-30 NOTE — Telephone Encounter (Signed)
Changed todays visit to virtual visit.

## 2019-01-30 NOTE — Progress Notes (Signed)
Phone [phone]   Subjective:  Virtual visit via Video note. Chief complaint: Chief Complaint  Patient presents with  . Follow-up  . Diabetes  . Depression  . Hypertension  . Hyperlipidemia  . Insomnia  . Sore Throat   This visit type was conducted due to national recommendations for restrictions regarding the COVID-19 Pandemic (e.g. social distancing).  This format is felt to be most appropriate for this patient at this time balancing risks to patient and risks to population by having him in for in person visit.  No physical exam was performed (except for noted visual exam or audio findings with Telehealth visits).    Our team/I connected with [name] at  3:40 PM EDT by a video enabled telemedicine application ([email or web address] or caregility through epic) and verified that I am speaking with the correct person using two identifiers.  Location patient: [place] Location provider: [facility], office Persons participating in the virtual visit:  patient  Our team/I discussed the limitations of evaluation and management by telemedicine and the availability of in person appointments. In light of current covid-19 pandemic, patient also understands that we are trying to protect them by minimizing in office contact if at all possible.  The patient expressed consent for telemedicine visit and agreed to proceed. Patient understands insurance will be billed.   ROS- No fever, chills, cough, congestion, runny nose, shortness of breath, fatigue, body aches, , headache,  vomiting, or new loss of taste or smell. No known contacts with covid 19 or someone being tested for covid 19.   Past Medical History-  Patient Active Problem List   Diagnosis Date Noted  . 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    Priority: High  . Bronchiectasis ([place]) 10/18/2015    Priority: High  . Diabetes mellitus type II, controlled ([place]) 12/28/2006    Priority: High  . Adenomatous polyp 12/20/2018    Priority: Medium  .  Personal history of colonic polyps 05/18/2017    Priority: Medium  . Aortic atherosclerosis ([place]) 02/16/2016    Priority: Medium  . Solitary pulmonary nodule 01/08/2016    Priority: Medium  . IBS (irritable bowel syndrome) 07/17/2014    Priority: Medium  . Recurrent major depression in full remission ([place]) 01/03/2014    Priority: Medium  . Essential hypertension, benign 01/03/2014    Priority: Medium  . Primary open-angle glaucoma 08/02/2012    Priority: Medium  . Fatty liver 03/18/2009    Priority: Medium  . Hyperlipidemia associated with type 2 diabetes mellitus ([place]) 07/26/2007    Priority: Medium  . ANEMIA, B12 DEFICIENCY 12/29/2006    Priority: Medium  . RESTLESS LEG SYNDROME, MILD 12/28/2006    Priority: Medium  . OSTEOPOROSIS 12/12/2006    Priority: Medium  . History of total knee replacement, bilateral 11/14/2017    Priority: Low  . Arthritis of midfoot 03/03/2016    Priority: Low  . Hammertoes of both feet 03/03/2016    Priority: Low  . Posterior tibial tendon dysfunction 01/03/2014    Priority: Low  . Osteoarthritis, knee 01/03/2014    Priority: Low  . Glaucoma 01/03/2014    Priority: Low  . Obesity (BMI 30-39.9) 06/15/2013    Priority: Low  . Foot tendinitis 07/20/2010    Priority: Low  . INSOMNIA, CHRONIC 07/25/2009    Priority: Low  . GERD 12/25/2007    Priority: Low  . NEUROPATHY, IDIOPATHIC PERIPHERAL NEC 12/28/2006    Priority: Low  . BREAST CANCER, HX OF 12/12/2006    Priority:  Low  . Pain due to onychomycosis of toenails of both feet 11/08/2018  . Diabetic neuropathy ([place]) 11/08/2018    Medications- reviewed and updated Current Outpatient Medications  Medication Sig Dispense Refill  . atenolol (TENORMIN) 25 MG tablet TAKE 1 TABLET BY MOUTH ONCE DAILY (Patient taking differently: Take 25 mg by mouth daily. ) 90 tablet 3  . Bacillus Coagulans-Inulin (ALIGN PREBIOTIC-PROBIOTIC PO) Take 1 capsule by mouth daily.     . budesonide-formoterol  (SYMBICORT) 160-4.5 MCG/ACT inhaler Inhale 2 puffs into the lungs 2 (two) times daily.    . cetirizine (ZYRTEC) 10 MG tablet Take 10 mg by mouth daily.    . Cholecalciferol (VITAMIN D3) 125 MCG (5000 UT) TABS Take 5,000 Units by mouth daily.    . Coenzyme Q10 (COQ10) 200 MG CAPS Take 200 mg by mouth daily.    . colesevelam (WELCHOL) 625 MG tablet TAKE 1 TABLET BY MOUTH THREE TIMES DAILY 90 tablet 0  . dicyclomine (BENTYL) 10 MG capsule TAKE 2 CAPSULES BY MOUTH 4 TIMES DAILY AS NEEDED FOR  SPASMS 120 capsule 0  . dorzolamide-timolol (COSOPT) 22.3-6.8 MG/ML ophthalmic solution Place 1 drop into both eyes 2 (two) times daily.     [name] ESTRING 2 MG vaginal ring INSERT 1 RING  INTO VAGINA EVERY 3 MONTHS 1 each 3  . FLUoxetine (PROZAC) 40 MG capsule TAKE 1 CAPSULE BY MOUTH ONCE DAILY. (Patient taking differently: Take 40 mg by mouth daily. ) 90 capsule 3  . glimepiride (AMARYL) 4 MG tablet Take 2 tablets (8 mg total) by mouth daily with breakfast. 180 tablet 3  . glucose blood (FREESTYLE LITE) test strip USE TO CHECK BLOOD SUGAR EVERY MORNING E11.9 100 each 3  . hydrocortisone 2.5 % cream Apply 1 application topically 2 (two) times daily as needed (skin irritation/rash).     [name] latanoprost (XALATAN) 0.005 % ophthalmic solution Place 1 drop into both eyes at bedtime.    [name] lisinopril (ZESTRIL) 10 MG tablet Take 1 tablet (10 mg total) by mouth daily. NO PRINT ORDER- to add back to medication list (Patient taking differently: Take 10 mg by mouth at bedtime. NO PRINT ORDER- to add back to medication list) 90 tablet 3  . Methylcellulose, Laxative, (CITRUCEL PO) Take 3 tablets by mouth at bedtime.    . Multiple Vitamin (MULTIVITAMIN WITH MINERALS) TABS tablet Take 1 tablet by mouth daily. Centrum Silver    . pramipexole (MIRAPEX) 0.5 MG tablet TAKE 1 TABLET BY MOUTH AT BEDTIME (Patient taking differently: Take 0.5 mg by mouth at bedtime. ) 90 tablet 0  . Respiratory Therapy Supplies (FLUTTER) DEVI Use as directed 1  each 0  . Resveratrol 250 MG CAPS Take 250 mg by mouth daily.    . Selenium 200 MCG CAPS Take 200 mcg by mouth at bedtime.     . temazepam (RESTORIL) 15 MG capsule TAKE 2 CAPSULES BY MOUTH AT BEDTIME AS NEEDED FOR  SLEEP 60 capsule 5  . VICTOZA 18 MG/3ML SOPN INJECT 0.3 ML (1.8 MG)  INTO THE SKIN DAILY 9 mL 0  . vitamin B-12 (CYANOCOBALAMIN) 1000 MCG tablet Take 1,000 mcg by mouth daily.    . rosuvastatin (CRESTOR) 5 MG tablet Take 1 tablet (5 mg total) by mouth once a week. 13 tablet 3   No current facility-administered medications for this visit.      Objective:  BP 121/63   Temp 98.3 F (36.8 C)   Wt 248 lb (112.5 kg)   LMP 05/10/1992  Comment: partial  BMI 33.63 kg/m  Gen: NAD, resting comfortably    Assessment and Plan   # Sore Throat S: pt states she woke up with a very mild sore throat this morning and was exposed to her daughter in law who was just diagnosed with the flu (they were around each other for 2 hours working on grandson with school work). They were not wearing masks.   Did have some loose stool this morning but thought related to IBS. Also was somewhat nauseous.  A/P: I am not sure if the [facility] facility can obtain the swab for both flu and COVID-19 but I am going to order this to try-would also contain RSV.  I think we need to rule out covid-19 given current pandemic.  With recent flu exposure would be great to have that tested- if there is going to the code 19 test then we can likely have her in for flu swab or even strep swab if symptoms persist-perhaps a car side.  # Diabetes S: has been controlled on Victoza daily and Glimepiride 2 mg daily, pt states she is doing well on the Victoza and Glimipride CBGs- 9/22-120 7am. Under 150 most AMs- usually 120s or 130s in AMs- lowest was 98 Exercise and diet- pt states she is not doing well on her eating because she is eating a lot of ice cream. She states it is one of her priorities to be able to enjoy ice cream-  loves peach and strawberry all natural from [employer].  Lab Results  Component Value Date   HGBA1C 7.5 (H) 10/04/2018   HGBA1C 5.6 12/29/2017   HGBA1C 6.3 10/06/2017   A/P: hopefully controlled- need to make sure no flu or covid then we will set up having her in for a1c and flu shot along with other labs   #B12 deficiency- wants b12 checked. Only doing pills.   #hypertension S: controlled on Atenolol 25 mg daily and Lisinopril 10 mg daily. Pt states she is doing well on the above medications however she feels like it may be running low some days. BP Readings from Last 3 Encounters:  01/30/19 121/63  01/17/19 (!) 102/54  12/19/18 (!) 112/37  A/P: Good control today-continue to monitor-may need to reduce medications in the future  #hyperlipidemia S: Poorly controlled on Welchol 625 mg TID Lab Results  Component Value Date   CHOL 152 10/04/2018   HDL 42.40 10/04/2018   LDLCALC 81 10/04/2018   LDLDIRECT 77.0 02/16/2016   TRIG 143.0 10/04/2018   CHOLHDL 4 10/04/2018   A/P: We reviewed prior lipids together- patient is agreeable to try Rosuvastatin 5 once weekly-this will be sent in today  # Depression S: Taking d Fluoxetine 40 mg daily. Pt states she is still not tip top mentally due to not being able to get out and go places and do things.  A lot of tension with grandson coming over for schooling daily.  Depression screen [facility] 2/9 01/30/2019  Decreased Interest 1  Down, Depressed, Hopeless 1  PHQ - 2 Score 2  Altered sleeping 1  Tired, decreased energy 1  Change in appetite 1  Feeling bad or failure about yourself  0  Trouble concentrating 0  Moving slowly or fidgety/restless 0  Suicidal thoughts 0  PHQ-9 Score 5  Difficult doing work/chores Somewhat difficult  Some recent data might be hidden  A/P: I would consider this reasonable control given current stressors with covid-19-continue fluoxetine   # Insomnia S:Taking Temazepam  two 15 mg tablets at bedtime prn. Pt  is doing well with this medication and is needing a refill  A/P: Stable problem-continue current medication.  I removed alprazolam from her medication list-was provided by gynecology and she did not tolerate this well-we discussed should not take 2 different benzodiazepines together and she prefers to continue temazepam  Recommended follow up: Me to set up lab visit in office after covid-19 testing/resolution of current illness.  Likely then recommend 4 to 4-month follow-up -She also wants flu shot at that time Future Appointments  Date Time Provider [place]  02/05/2019  3:00 PM Megan Salon, MD [facility] [place] None  02/13/2019  3:00 PM Gardiner Barefoot, DPM [facility]   Lab/Order associations:   ICD-10-CM   1. Sore throat  J02.9 COVID-19, Flu A+B and RSV  2. Controlled type 2 diabetes mellitus without complication, without long-term current use of insulin (HCC)  E11.9 Hemoglobin A1c    Comprehensive metabolic panel    Lipid panel  3. Essential hypertension, benign  I10   4. Hyperlipidemia associated with type 2 diabetes mellitus ([place])  E11.69    E78.5   5. ANEMIA, B12 DEFICIENCY  D51.8 Vitamin B12    Meds ordered this encounter  Medications  . rosuvastatin (CRESTOR) 5 MG tablet    Sig: Take 1 tablet (5 mg total) by mouth once a week.    Dispense:  13 tablet    Refill:  3  . temazepam (RESTORIL) 15 MG capsule    Sig: TAKE 2 CAPSULES BY MOUTH AT BEDTIME AS NEEDED FOR  SLEEP    Dispense:  60 capsule    Refill:  5  . glimepiride (AMARYL) 4 MG tablet    Sig: Take 2 tablets (8 mg total) by mouth daily with breakfast.    Dispense:  180 tablet    Refill:  3    Return precautions advised.  [name], MD

## 2019-01-31 ENCOUNTER — Other Ambulatory Visit: Payer: Self-pay

## 2019-01-31 DIAGNOSIS — R6889 Other general symptoms and signs: Secondary | ICD-10-CM | POA: Diagnosis not present

## 2019-01-31 DIAGNOSIS — Z20822 Contact with and (suspected) exposure to covid-19: Secondary | ICD-10-CM

## 2019-02-01 ENCOUNTER — Other Ambulatory Visit: Payer: Self-pay | Admitting: Family Medicine

## 2019-02-01 ENCOUNTER — Telehealth: Payer: Self-pay | Admitting: Obstetrics & Gynecology

## 2019-02-01 ENCOUNTER — Other Ambulatory Visit: Payer: Self-pay

## 2019-02-01 LAB — NOVEL CORONAVIRUS, NAA: SARS-CoV-2, NAA: NOT DETECTED

## 2019-02-01 NOTE — Telephone Encounter (Signed)
Patient has pending covid test (symptoms: sore throat, diarrhea). Test completed 01/31/19. Needs to reschedule estring follow up with Dr. Sabra Heck.

## 2019-02-01 NOTE — Telephone Encounter (Signed)
Spoke with patient. Seen by PCP on 9/22 for sore throat and diarrhea, Covid19 testing pending. Patient is due to have Estring exchanged.   Patient will return call to office to notify of Covid19 results, will then reschedule OV. Advised patient if negative, will need to wait for symptoms to resolve for OV. Patient agreeable.

## 2019-02-05 ENCOUNTER — Ambulatory Visit: Payer: Medicare Other | Admitting: Obstetrics & Gynecology

## 2019-02-05 NOTE — Telephone Encounter (Signed)
Spoke with patient. Patient reports Covid19 testing negative 01/31/19, all symptoms have resolved. Patient declines morning appointments, request after 2:30pm. OV scheduled for 10/6 at 4:30pm with Dr. Sabra Heck.   Routing to provider for final review. Patient is agreeable to disposition. Will close encounter.

## 2019-02-09 ENCOUNTER — Other Ambulatory Visit: Payer: Self-pay

## 2019-02-09 ENCOUNTER — Telehealth: Payer: Self-pay | Admitting: Pulmonary Disease

## 2019-02-09 DIAGNOSIS — J471 Bronchiectasis with (acute) exacerbation: Secondary | ICD-10-CM

## 2019-02-09 MED ORDER — AZITHROMYCIN 250 MG PO TABS: 250.0000 mg | ORAL_TABLET | ORAL | 0 refills | Status: DC

## 2019-02-09 MED ORDER — PREDNISONE 10 MG PO TABS: ORAL_TABLET | ORAL | 0 refills | Status: DC

## 2019-02-09 NOTE — Telephone Encounter (Signed)
Spoke with the pt and notified of recs per Aaron Edelman  She verbalized understanding  I have cancelled appt for 02/12/19 and scheduled her for 02/15/19  Rxs sent to pharm and covid test ordered

## 2019-02-09 NOTE — Telephone Encounter (Signed)
I called and spoke with the pt  She states having nasal congestion, PND, sneezing x 2 days  Her throat "feels funny"- denies soreness but states her throat and upper chest feel tight and she is wheezing some  She is feeling slightly more SOB than usual and wheezing some  She states she had temp of 99.6 3 days ago but no fever since then  She was exposed to someone that had the flu 1 wk ago and was sent to be tested for covid and this ws neg (01/31/19) She is scheduled for ov with Aaron Edelman on Monday 02/12/19 and would like to come in  I advised that this should be video or televisit due to symptoms, but being this is the wkend coming will forward to [place] to see if any recs for now, thanks!

## 2019-02-09 NOTE — Telephone Encounter (Signed)
02/09/2019 1655  With patient's elevated temperatures and acute symptoms that have worsened since last COVID testing.  I would recommend that she one gets outpatient COVID tested as well as starts empiric antibiotics.  The Monday visit does need to be a video visit with patient's acute symptoms or it can be pushed back towards the end of the week once we would have COVID testing results and then it could be in office.  This also would be okay for the patient to do.  Can go ahead and place order for:  Azithromycin 250mg  tablet  >>>Take 2 tablets (500mg  total) today, and then 1 tablet (250mg ) for the next four days  >>>take with food  >>>can also take probiotic and / or yogurt while on antibiotic   Prednisone 10mg  tablet  >>>4 tabs for 2 days, then 3 tabs for 2 days, 2 tabs for 2 days, then 1 tab for 2 days, then stop >>>take with food  >>>take in the morning   Please place the order for [id number] for COVID testing.  Place order as normal and lab collect.  Appointments are not required and please instruct the patient to present to 1 of these testing sites listed below whichever is closest and most convenient for the patient.  COVID Testing Site Locations (For sick patients only, pre-procedure is done differently)  . [facility] [address] [place], [place] [address] . [place]  (on [employer])  o [employer] conservation officer  . [facility]   Please also ensure patient is using a flutter valve.  That she is hydrating well.  That she continues to monitor her temperatures.  If symptoms worsen between now and the next visit despite these interventions patient needs to present to an emergency room for further evaluation.  Wyn Quaker, FNP

## 2019-02-12 ENCOUNTER — Ambulatory Visit: Payer: Medicare Other | Admitting: Pulmonary Disease

## 2019-02-12 ENCOUNTER — Telehealth: Payer: Self-pay | Admitting: Pulmonary Disease

## 2019-02-12 NOTE — Telephone Encounter (Signed)
LMTCB.   Patient has a tele-visit at 230 today, this can be addressed at that time.

## 2019-02-13 ENCOUNTER — Other Ambulatory Visit: Payer: Self-pay

## 2019-02-13 ENCOUNTER — Ambulatory Visit (INDEPENDENT_AMBULATORY_CARE_PROVIDER_SITE_OTHER): Payer: Medicare Other | Admitting: Podiatry

## 2019-02-13 ENCOUNTER — Encounter: Payer: Self-pay | Admitting: Obstetrics & Gynecology

## 2019-02-13 ENCOUNTER — Ambulatory Visit (INDEPENDENT_AMBULATORY_CARE_PROVIDER_SITE_OTHER): Payer: Medicare Other | Admitting: Obstetrics & Gynecology

## 2019-02-13 ENCOUNTER — Telehealth: Payer: Self-pay | Admitting: Family Medicine

## 2019-02-13 VITALS — BP 130/66 | HR 96 | Temp 97.6°F | Resp 18

## 2019-02-13 DIAGNOSIS — M79675 Pain in left toe(s): Secondary | ICD-10-CM

## 2019-02-13 DIAGNOSIS — M79674 Pain in right toe(s): Secondary | ICD-10-CM | POA: Diagnosis not present

## 2019-02-13 DIAGNOSIS — N952 Postmenopausal atrophic vaginitis: Secondary | ICD-10-CM | POA: Diagnosis not present

## 2019-02-13 DIAGNOSIS — B351 Tinea unguium: Secondary | ICD-10-CM

## 2019-02-13 DIAGNOSIS — E1142 Type 2 diabetes mellitus with diabetic polyneuropathy: Secondary | ICD-10-CM

## 2019-02-13 NOTE — Telephone Encounter (Signed)
I called the patient to schedule AWV with Courtney, but there was no answer and no option to leave a message. VDM (Dee-Dee) °

## 2019-02-13 NOTE — Telephone Encounter (Signed)
ATC patient, phone just rang and rang, no voicemail to be left.  Visit is 02/15/19 with Aaron Edelman

## 2019-02-13 NOTE — Progress Notes (Signed)
GYNECOLOGY  VISIT  CC:   estring exchange   HPI: 78 y.o. G0P0 Married White or Caucasian female here for Estring removal and replacement.  Is getting estring from [place] and this has been really good and much less expensive.  Denies vaginal bleeding.  Denies urinary symptoms.  Denies vaginal itching or discharge.  Is really having trouble with breathing and oxygen saturation.  Feels so weak.  Is in a wheel chair today.  Never was able to get reconditioned after her knee replacement although she says her knees are great!!  Has pulmonology appt later this week.    GYNECOLOGIC HISTORY: Patient's last menstrual period was 05/10/1992. Contraception: none Menopausal hormone therapy: estring  Patient Active Problem List   Diagnosis Date Noted  . Adenomatous polyp 12/20/2018  . Pain due to onychomycosis of toenails of both feet 11/08/2018  . Diabetic neuropathy ([place]) 11/08/2018  . History of total knee replacement, bilateral 11/14/2017  . Personal history of colonic polyps 05/18/2017  . Arthritis of midfoot 03/03/2016  . Hammertoes of both feet 03/03/2016  . Aortic atherosclerosis ([place]) 02/16/2016  . Solitary pulmonary nodule 01/08/2016  . 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  . Bronchiectasis ([place]) 10/18/2015  . IBS (irritable bowel syndrome) 07/17/2014  . Recurrent major depression in full remission ([place]) 01/03/2014  . Posterior tibial tendon dysfunction 01/03/2014  . Osteoarthritis, knee 01/03/2014  . Glaucoma 01/03/2014  . Essential hypertension, benign 01/03/2014  . Obesity (BMI 30-39.9) 06/15/2013  . Primary open-angle glaucoma 08/02/2012  . Foot tendinitis 07/20/2010  . INSOMNIA, CHRONIC 07/25/2009  . Fatty liver 03/18/2009  . GERD 12/25/2007  . Hyperlipidemia associated with type 2 diabetes mellitus ([place]) 07/26/2007  . ANEMIA, B12 DEFICIENCY 12/29/2006  . Diabetes mellitus type II, controlled ([place]) 12/28/2006  . RESTLESS LEG SYNDROME, MILD 12/28/2006  . NEUROPATHY, IDIOPATHIC  PERIPHERAL NEC 12/28/2006  . OSTEOPOROSIS 12/12/2006  . BREAST CANCER, HX OF 12/12/2006    Past Medical History:  Diagnosis Date  . Allergy   . Anemia   . Anxiety   . Arthritis    right knee;injections every 25months   . Asthma   . Bronchiectasis ([place])   . Cancer ([place]) 1994/1995   HX BREAST CANCER/ right brreast and left breast in 1995  . COMPRESSION FRACTURE, LUMBAR VERTEBRAE 08/21/2008   Qualifier: Diagnosis of  By: Arnoldo Morale MD, Balinda Quails   . Depression   . Diabetes mellitus    takes Amaryl and Januvia daily  . Difficulty sleeping   . Diverticulitis   . Early cataracts, bilateral   . Eczema   . Endometriosis   . Gastritis   . Glaucoma   . Hammer toe   . Headache(784.0)    Migraines  . Hyperlipidemia associated with type 2 diabetes mellitus ([place]) 07/26/2007   Diet/exercise control.    . Hypertension    takes Atenolol daily  . IBS (irritable bowel syndrome)   . Joint pain   . Kidney stone   . Neuropathy   . Open wound of second toe of left foot in past   at pre-op appt, wound appears clear of infection 1 month post removal of toenail, no obvious exudate present nor any redness,  . Osteomyelitis ([place])    left 2nd toe  . Osteopenia   . PONV (postoperative nausea and vomiting)   . Posterior tibial tendon dysfunction    left foot  . Scoliosis   . Severe esophageal dysplasia   . 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 Thyroid disease   . Vitamin D deficiency    takes Vit d daily  . Weakness    uses a walker and wheelchair    Past Surgical History:  Procedure Laterality Date  . ADENOIDECTOMY     at age 83  . BIOPSY  12/19/2018   Procedure: BIOPSY;  Surgeon: Mauri Pole, MD;  Location: [facility];  Service: Endoscopy;;  . CATARACT EXTRACTION    . CHOLECYSTECTOMY  06/2008  . COLONOSCOPY WITH PROPOFOL N/A 05/17/2017   Procedure: COLONOSCOPY WITH PROPOFOL;  Surgeon: Mauri Pole, MD;  Location: [facility];  Service:  Endoscopy;  Laterality: N/A;  PT WILL BE ADMITTED THE DAY BEFORE FOR PREP PER ROBIN KB  . COLONOSCOPY WITH PROPOFOL N/A 12/19/2018   Procedure: COLONOSCOPY WITH PROPOFOL;  Surgeon: Mauri Pole, MD;  Location: [facility];  Service: Endoscopy;  Laterality: N/A;  . DILATION AND CURETTAGE OF UTERUS    . excision on breast     internal infected suture from breast surgery  . excision removed from neck     infected lymph node  . FOOT SURGERY     left foot-cleaning out areas on toes at the wound center-having MRI to determine if osteomyelitis  . HYSTEROSCOPY  06/22/11   PMB submucosal myoma  . JOINT REPLACEMENT     left total shoulder  . LAPAROSCOPIC OOPHORECTOMY Right 12/2004   absent LSO  . LAPAROTOMY    . MASTECTOMY Bilateral 1994 right, 1995 left  . POLYPECTOMY  12/19/2018   Procedure: POLYPECTOMY;  Surgeon: Mauri Pole, MD;  Location: [facility];  Service: Endoscopy;;  . SHOULDER HEMI-ARTHROPLASTY  01/01/2012   Procedure: SHOULDER HEMI-ARTHROPLASTY;  Surgeon: Roseanne Kaufman, MD;  Location: [facility];  Service: Orthopedics;  Laterality: Left;  Left Shoulder Hemi Arthroplasty with Repair and Reconstruction as Necessary   . THYROIDECTOMY     26yrs ago. follows endocrine  . TONSILLECTOMY    . TOTAL KNEE ARTHROPLASTY Right 12/30/2014   Procedure: RIGHT TOTAL KNEE ARTHROPLASTY;  Surgeon: Gaynelle Arabian, MD;  Location: [facility];  Service: Orthopedics;  Laterality: Right;  . TOTAL KNEE ARTHROPLASTY Left 11/29/2016   Procedure: LEFT TOTAL KNEE ARTHROPLASTY;  Surgeon: Gaynelle Arabian, MD;  Location: [facility];  Service: Orthopedics;  Laterality: Left;    MEDS:   Current Outpatient Medications on File Prior to Visit  Medication Sig Dispense Refill  . atenolol (TENORMIN) 25 MG tablet TAKE 1 TABLET BY MOUTH ONCE DAILY (Patient taking differently: Take 25 mg by mouth daily. ) 90 tablet 3  . cetirizine (ZYRTEC) 10 MG tablet Take 10 mg by mouth daily.    . Cholecalciferol (VITAMIN D3) 125 MCG  (5000 UT) TABS Take 5,000 Units by mouth daily.    . Coenzyme Q10 (COQ10) 200 MG CAPS Take 200 mg by mouth daily.    . colesevelam (WELCHOL) 625 MG tablet TAKE 1 TABLET BY MOUTH THREE TIMES DAILY 90 tablet 0  . dicyclomine (BENTYL) 10 MG capsule TAKE 2 CAPSULES BY MOUTH 4 TIMES DAILY AS NEEDED FOR  SPASMS 120 capsule 0  . dorzolamide-timolol (COSOPT) 22.3-6.8 MG/ML ophthalmic solution Place 1 drop into both eyes 2 (two) times daily.     [name] ESTRING 2 MG vaginal ring INSERT 1 RING  INTO VAGINA EVERY 3 MONTHS 1 each 3  . FLUoxetine (PROZAC) 40 MG capsule TAKE 1 CAPSULE BY MOUTH ONCE DAILY. (Patient taking differently: Take 40 mg by mouth daily. ) 90 capsule 3  . glimepiride (AMARYL)  4 MG tablet Take 2 tablets (8 mg total) by mouth daily with breakfast. 180 tablet 3  . glucose blood (FREESTYLE LITE) test strip USE TO CHECK BLOOD SUGAR EVERY MORNING E11.9 100 each 3  . latanoprost (XALATAN) 0.005 % ophthalmic solution Place 1 drop into both eyes at bedtime.    [name] lisinopril (ZESTRIL) 10 MG tablet Take 1 tablet (10 mg total) by mouth daily. NO PRINT ORDER- to add back to medication list (Patient taking differently: Take 10 mg by mouth at bedtime. NO PRINT ORDER- to add back to medication list) 90 tablet 3  . Methylcellulose, Laxative, (CITRUCEL PO) Take 3 tablets by mouth at bedtime.    . Multiple Vitamin (MULTIVITAMIN WITH MINERALS) TABS tablet Take 1 tablet by mouth daily. Centrum Silver    . pramipexole (MIRAPEX) 0.5 MG tablet TAKE 1 TABLET BY MOUTH AT BEDTIME 90 tablet 0  . Respiratory Therapy Supplies (FLUTTER) DEVI Use as directed 1 each 0  . rosuvastatin (CRESTOR) 5 MG tablet Take 1 tablet (5 mg total) by mouth once a week. 13 tablet 3  . Selenium 200 MCG CAPS Take 200 mcg by mouth at bedtime.     . temazepam (RESTORIL) 15 MG capsule TAKE 2 CAPSULES BY MOUTH AT BEDTIME AS NEEDED FOR  SLEEP 60 capsule 5  . VICTOZA 18 MG/3ML SOPN INJECT 0.3 ML (1.8 MG)  INTO THE SKIN DAILY 9 mL 0  . vitamin B-12  (CYANOCOBALAMIN) 1000 MCG tablet Take 1,000 mcg by mouth daily.    . Bacillus Coagulans-Inulin (ALIGN PREBIOTIC-PROBIOTIC PO) Take 1 capsule by mouth daily.     . budesonide-formoterol (SYMBICORT) 160-4.5 MCG/ACT inhaler Inhale 2 puffs into the lungs 2 (two) times daily.    . hydrocortisone 2.5 % cream Apply 1 application topically 2 (two) times daily as needed (skin irritation/rash).      No current facility-administered medications on file prior to visit.     ALLERGIES: Nitrofurantoin, Levaquin [levofloxacin in d5w], Brimonidine, Cephalexin, Erythromycin ethylsuccinate, and Percocet [oxycodone-acetaminophen]  Family History  Problem Relation Age of Onset  . Arthritis Mother   . COPD Father   . Heart disease Father        MI 54 - states he died of lockjaw  . Hypertension Father   . Hyperlipidemia Father   . Pancreatic cancer Paternal Grandfather   . Colon cancer Neg Hx   . Esophageal cancer Neg Hx   . Rectal cancer Neg Hx   . Stomach cancer Neg Hx     SH:  Married, non smoker  Review of Systems  All other systems reviewed and are negative.   PHYSICAL EXAMINATION:    BP 130/66   Pulse 96   Temp 97.6 F (36.4 C) (Temporal)   Resp 18   LMP 05/10/1992 Comment: partial    General appearance: alert, cooperative and appears stated age Lymph:  no inguinal LAD noted  Pelvic: External genitalia:  no lesions              Urethra:  normal appearing urethra with no masses, tenderness or lesions              Bartholins and Skenes: normal                 Vagina: normal appearing vagina with normal color and discharge, no lesions, Estring removed and replaced              Cervix: no lesions  Bimanual Exam:  Uterus:  normal size, contour, position, consistency, mobility, non-tender              Adnexa: no mass, fullness, tenderness   Chaperone was present for exam.  Assessment: Vaginal atrophy with Estring use (pt cannot remove herself)  Plan: Return in 3-4 months  for repeat exam and removal/replacment of Estring.  Does not need rx as has enough RFs for the year.

## 2019-02-14 ENCOUNTER — Encounter: Payer: Self-pay | Admitting: Podiatry

## 2019-02-14 NOTE — Telephone Encounter (Signed)
This patient is scheduled to have a visit with me on 02/15/2019.  She was just treated for an exacerbation.  It looks like she contacted our office because she wanted to be reconsidered as far as did not have to go through and get COVID testing.  Patient needs to be COVID screened for appointment tomorrow.    Wyn Quaker, FNP

## 2019-02-14 NOTE — Telephone Encounter (Signed)
Patient was covid screened by [facility]. Patient was negative per screen Will be here for another set of screening questions when arrived to office   Will route to Aaron Edelman to see if this is appropriate and if patient can still have in office visit or does he want her to go get tested.

## 2019-02-14 NOTE — Telephone Encounter (Signed)
Can come to ov if neg screen  Aaron Edelman

## 2019-02-14 NOTE — Progress Notes (Signed)
  Subjective:  Patient ID: [name], female    DOB: March 05, 1941,  MRN: [id number]  Chief Complaint  Patient presents with  . Nail Problem    pt is here for a nail trim   78 y.o. female returns for the above complaint. His A1c is 7.5 with last blood sugar of 126 this morning.  Is mildly controlled diabetic type II.  He is here today for bilateral mycotic toenails to be debrided that are painful in nature.   Objective:  There were no vitals filed for this visit. Podiatric Exam: Vascular: dorsalis pedis and posterior tibial pulses are palpable bilateral. Capillary return is immediate. Temperature gradient is WNL. Skin turgor WNL  Sensorium: Normal Semmes Weinstein monofilament test. Normal tactile sensation bilaterally. Nail Exam: Pt has thick disfigured discolored nails with subungual debris noted bilateral entire nail hallux through fifth toenails Ulcer Exam: There is no evidence of ulcer or pre-ulcerative changes or infection. Orthopedic Exam: Muscle tone and strength are WNL. No limitations in general ROM. No crepitus or effusions noted. HAV  B/L.  Hammer toes 2-5  B/L. Skin: No Porokeratosis. No infection or ulcers  Assessment & Plan:  Patient was evaluated and treated and all questions answered.  Onychomycosis with pain  -Nails palliatively debrided as below. -Educated on self-care  Procedure: Nail Debridement Rationale: pain  Type of Debridement: manual, sharp debridement. Instrumentation: Nail nipper, rotary burr. Number of Nails: 10  Procedures and Treatment: Consent by patient was obtained for treatment procedures. The patient understood the discussion of treatment and procedures well. All questions were answered thoroughly reviewed. Debridement of mycotic and hypertrophic toenails, 1 through 5 bilateral and clearing of subungual debris. No ulceration, no infection noted.  Return Visit-Office Procedure: Patient instructed to return to the office for a follow up visit 3  months for continued evaluation and treatment.  Boneta Lucks, DPM    No follow-ups on file.

## 2019-02-14 NOTE — Telephone Encounter (Signed)
Spoke with the pt and notified ok to keep appt since her screen is neg

## 2019-02-15 ENCOUNTER — Ambulatory Visit: Payer: Medicare Other | Admitting: Pulmonary Disease

## 2019-02-19 ENCOUNTER — Other Ambulatory Visit: Payer: Self-pay | Admitting: Family Medicine

## 2019-02-22 ENCOUNTER — Other Ambulatory Visit: Payer: Self-pay | Admitting: Family Medicine

## 2019-02-22 ENCOUNTER — Ambulatory Visit (INDEPENDENT_AMBULATORY_CARE_PROVIDER_SITE_OTHER): Payer: Medicare Other | Admitting: Pulmonary Disease

## 2019-02-22 ENCOUNTER — Encounter: Payer: Self-pay | Admitting: Pulmonary Disease

## 2019-02-22 ENCOUNTER — Other Ambulatory Visit: Payer: Self-pay

## 2019-02-22 VITALS — BP 120/68 | HR 84 | Temp 97.4°F | Ht 72.0 in | Wt 248.0 lb

## 2019-02-22 DIAGNOSIS — Z0000 Encounter for general adult medical examination without abnormal findings: Secondary | ICD-10-CM | POA: Insufficient documentation

## 2019-02-22 DIAGNOSIS — Z23 Encounter for immunization: Secondary | ICD-10-CM | POA: Diagnosis not present

## 2019-02-22 DIAGNOSIS — J479 Bronchiectasis, uncomplicated: Secondary | ICD-10-CM | POA: Diagnosis not present

## 2019-02-22 DIAGNOSIS — R5381 Other malaise: Secondary | ICD-10-CM | POA: Diagnosis not present

## 2019-02-22 DIAGNOSIS — J398 Other specified diseases of upper respiratory tract: Secondary | ICD-10-CM

## 2019-02-22 DIAGNOSIS — R0609 Other forms of dyspnea: Secondary | ICD-10-CM | POA: Insufficient documentation

## 2019-02-22 DIAGNOSIS — R0602 Shortness of breath: Secondary | ICD-10-CM | POA: Diagnosis not present

## 2019-02-22 NOTE — Patient Instructions (Addendum)
You were seen today by Lauraine Rinne, NP  for:   1. Bronchiectasis without complication (HCC)  Bronchiectasis: This is the medical term which indicates that you have damage, dilated airways making you more susceptible to respiratory infection. Use a flutter valve 10 breaths twice a day or 4 to 5 breaths 4-5 times a day to help clear mucus out Let us know if you have cough with change in mucus color or fevers or chills.  At that point you would need an antibiotic. Maintain a healthy nutritious diet, eating whole foods Take your medications as prescribed   Continue to take your Zyrtec as prescribed  Increase overall physical activity  Work on increasing overall protein intake  2. Healthcare maintenance  High-dose flu vaccine today  3. Physical deconditioning  We recommend increasing her daily physical activity, try to have 10 minutes of physical activity daily and then increase up after that to reach a goal of 30 minutes of aerobic physical activity a day  Work on also improving your overall protein intake   We recommend today:  Orders Placed This Encounter  Procedures  . Flu Vaccine QUAD High Dose(Fluad)   Orders Placed This Encounter  Procedures  . Flu Vaccine QUAD High Dose(Fluad)   No orders of the defined types were placed in this encounter.   Follow Up:    Return in about 3 months (around 05/25/2019), or if symptoms worsen or fail to improve, for Follow up with Dr. Carlis Abbott - 69min OV - Former BQ .   Please do your part to reduce the spread of COVID-19:      Reduce your risk of any infection  and COVID19 by using the similar precautions used for avoiding the common cold or flu:  [name] Wash your hands often with soap and warm water for at least 20 seconds.  If soap and water are not readily available, use an alcohol-based hand sanitizer with at least 60% alcohol.  . If coughing or sneezing, cover your mouth and nose by coughing or sneezing into the elbow areas of your  shirt or coat, into a tissue or into your sleeve (not your hands). Langley Gauss A MASK when in public  . Avoid shaking hands with others and consider head nods or verbal greetings only. . Avoid touching your eyes, nose, or mouth with unwashed hands.  . Avoid close contact with people who are sick. . Avoid places or events with large numbers of people in one location, like concerts or sporting events. . If you have some symptoms but not all symptoms, continue to monitor at home and seek medical attention if your symptoms worsen. . If you are having a medical emergency, call 911.   [place] / e-Visit: [email or web address]         [facility]: [facility]: [id number]                   [facility]: [id number]     It is flu season:   >>> Best ways to protect herself from the flu: Receive the yearly flu vaccine, practice good hand hygiene washing with soap and also using hand sanitizer when available, eat a nutritious meals, get adequate rest, hydrate appropriately   Please contact the office if your symptoms worsen or you have concerns that you are not improving.   Thank you for choosing [facility] for your healthcare, and  for allowing [name] to partner with you on your healthcare journey. I am thankful to be able to provide care to you today.   Wyn Quaker FNP-C   Influenza Virus Vaccine injection What is this medicine? INFLUENZA VIRUS VACCINE (in floo EN zuh VAHY ruhs vak SEEN) helps to reduce the risk of getting influenza also known as the flu. The vaccine only helps protect you against some strains of the flu. This medicine may be used for other purposes; ask your health care provider or pharmacist if you have questions. COMMON BRAND NAME(S): Afluria, Afluria Quadrivalent, Agriflu, Alfuria, FLUAD, Fluarix, Fluarix  Quadrivalent, Flublok, Flublok Quadrivalent, FLUCELVAX, Flulaval, Fluvirin, Fluzone, Fluzone High-Dose, Fluzone Intradermal What should I tell my health care provider before I take this medicine? They need to know if you have any of these conditions:  bleeding disorder like hemophilia  fever or infection  Guillain-Barre syndrome or other neurological problems  immune system problems  infection with the human immunodeficiency virus (HIV) or AIDS  low blood platelet counts  multiple sclerosis  an unusual or allergic reaction to influenza virus vaccine, latex, other medicines, foods, dyes, or preservatives. Different brands of vaccines contain different allergens. Some may contain latex or eggs. Talk to your doctor about your allergies to make sure that you get the right vaccine.  pregnant or trying to get pregnant  breast-feeding How should I use this medicine? This vaccine is for injection into a muscle or under the skin. It is given by a health care professional. A copy of Vaccine Information Statements will be given before each vaccination. Read this sheet carefully each time. The sheet may change frequently. Talk to your healthcare provider to see which vaccines are right for you. Some vaccines should not be used in all age groups. Overdosage: If you think you have taken too much of this medicine contact a poison control center or emergency room at once. NOTE: This medicine is only for you. Do not share this medicine with others. What if I miss a dose? This does not apply. What may interact with this medicine?  chemotherapy or radiation therapy  medicines that lower your immune system like etanercept, anakinra, infliximab, and adalimumab  medicines that treat or prevent blood clots like warfarin  phenytoin  steroid medicines like prednisone or cortisone  theophylline  vaccines This list may not describe all possible interactions. Give your health care provider a list  of all the medicines, herbs, non-prescription drugs, or dietary supplements you use. Also tell them if you smoke, drink alcohol, or use illegal drugs. Some items may interact with your medicine. What should I watch for while using this medicine? Report any side effects that do not go away within 3 days to your doctor or health care professional. Call your health care provider if any unusual symptoms occur within 6 weeks of receiving this vaccine. You may still catch the flu, but the illness is not usually as bad. You cannot get the flu from the vaccine. The vaccine will not protect against colds or other illnesses that may cause fever. The vaccine is needed every year. What side effects may I notice from receiving this medicine? Side effects that you should report to your doctor or health care professional as soon as possible:  allergic reactions like skin rash, itching or hives, swelling of the face, lips, or tongue Side effects that usually do not require medical attention (report to your doctor or health care professional if they continue or are bothersome):  fever  headache  muscle aches and pains  pain, tenderness, redness, or swelling at the injection site  tiredness This list may not describe all possible side effects. Call your doctor for medical advice about side effects. You may report side effects to FDA at 1-800-FDA-1088. Where should I keep my medicine? The vaccine will be given by a health care professional in a clinic, pharmacy, doctor's office, or other health care setting. You will not be given vaccine doses to store at home. NOTE: This sheet is a summary. It may not cover all possible information. If you have questions about this medicine, talk to your doctor, pharmacist, or health care provider.  2020 Elsevier/Gold Standard (2018-03-21 08:45:43)    Bronchiectasis  Bronchiectasis is a condition in which the airways in the lungs (bronchi) are damaged and widened. The  condition makes it hard for the lungs to get rid of mucus, and it causes mucus to gather in the bronchi. This condition often leads to lung infections, which can make the condition worse. What are the causes? You can be born with this condition or you can develop it later in life. Common causes of this condition include:  Cystic fibrosis.  Repeated lung infections, such as pneumonia or tuberculosis.  An object or other blockage in the lungs.  Breathing in fluid, food, or other objects (aspiration).  A problem with the immune system and lung structure that is present at birth (congenital). Sometimes the cause is not known. What are the signs or symptoms? Common symptoms of this condition include:  A daily cough that brings up mucus and lasts for more than 3 weeks.  Lung infections that happen often.  Shortness of breath and wheezing.  Weakness and fatigue. How is this diagnosed? This condition is diagnosed with tests, such as:  Chest X-rays or CT scans. These are done to check for changes in the lungs.  Breathing tests. These are done to check how well your lungs are working.  A test of a sample of your saliva (sputum culture). This test is done to check for infection.  Blood tests and other tests. These are done to check for related diseases or causes. How is this treated? Treatment for this condition depends on the severity of the illness and its cause. Treatment may include:  Medicines that loosen mucus so it can be coughed up (expectorants).  Medicines that relax the muscles of the bronchi (bronchodilators).  Antibiotic medicines to prevent or treat infection.  Physical therapy to help clear mucus from the lungs. Techniques may include: ? Postural drainage. This is when you sit or lie in certain positions so that mucus can drain by gravity. ? Chest percussion. This involves tapping the chest or back with a cupped hand. ? Chest vibration. For this therapy, a hand or  special equipment vibrates your chest and back.  Surgery to remove the affected part of the lung. This may be done in severe cases. Follow these instructions at home: Medicines  Take over-the-counter and prescription medicines only as told by your health care provider.  If you were prescribed an antibiotic medicine, take it as told by your health care provider. Do not stop taking the antibiotic even if you start to feel better.  Avoid taking sedatives and antihistamines unless your health care provider tells you to take them. These medicines tend to thicken the mucus in the lungs. Managing symptoms  Perform breathing exercises or techniques to clear your lungs as told by your health care  provider.  Consider using a cold steam vaporizer or humidifier in your room or home to help loosen secretions.  If you have a cough that gets worse at night, try sleeping in a semi-upright position. General instructions  Get plenty of rest.  Drink enough fluid to keep your urine clear or pale yellow.  Stay inside when pollution and ozone levels are high.  Stay up to date with vaccinations and immunizations.  Avoid cigarette smoke and other lung irritants.  Do not use any products that contain nicotine or tobacco, such as cigarettes and e-cigarettes. If you need help quitting, ask your health care provider.  Keep all follow-up visits as told by your health care provider. This is important. Contact a health care provider if:  You cough up more sputum than before and the sputum is yellow or green in color.  You have a fever.  You cannot control your cough and are losing sleep. Get help right away if:  You cough up blood.  You have chest pain.  You have increasing shortness of breath.  You have pain that gets worse or is not controlled with medicines.  You have a fever and your symptoms suddenly get worse. Summary  Bronchiectasis is a condition in which the airways in the lungs  (bronchi) are damaged and widened. The condition makes it hard for the lungs to get rid of mucus, and it causes mucus to gather in the bronchi.  Treatment usually includes therapy to help clear mucus from the lungs.  Stay up to date with vaccinations and immunizations. This information is not intended to replace advice given to you by your health care provider. Make sure you discuss any questions you have with your health care provider. Document Released: 02/21/2007 Document Revised: 04/08/2017 Document Reviewed: 05/31/2016 Elsevier Patient Education  2020 [employer].

## 2019-02-22 NOTE — Assessment & Plan Note (Signed)
Patient continues to have ongoing dyspnea, this is likely multifactorial and directly related to obesity, physical deconditioning as well as restrictive patterns of lung disease. Patient feels that Symbicort does help her with her breathing.  Plan: We will continue patient on Symbicort at this time If patient starts to have more exacerbations or recurrence of pneumonia than likely we will need to stop the ICS Consider repeating pulmonary function testing sometime in 2021 to compare to 2017 Patient to start working on increasing overall physical activity

## 2019-02-22 NOTE — Assessment & Plan Note (Signed)
Plan: High-dose flu vaccine today 

## 2019-02-22 NOTE — Progress Notes (Signed)
@Patient  ID: [name], female    DOB: [birth year], 78 y.o.   MRN: [id number]  Chief Complaint  Patient presents with  . Follow-up    F/U for bronchiectasis. Has a productive cough with clear, sticky mucus.     Referring provider: Marin Olp, MD  HPI:  78 year old female never smoker followed in our office for bronchiectasis and tracheomalacia  PMH: GERD, IBS, type 2 diabetes, hypertension, chronic kidney disease stage II Smoker/ Smoking History: Never smoker Maintenance:  Symbicort 160 Pt of: Dr. Lake Bells   02/22/2019  - Visit   78 year old female never smoker followed in our office for bronchiectasis.  Patient presenting today for a follow-up visit.  Patient was last seen in our office in December/2019 for an acute exacerbation of her bronchiectasis.  Patient reports that she feels that she is at her baseline today.  She typically has a dry cough occasionally productive with clear sputum.  She does not have any discolored sputum recently.  She continues to also be maintained on Symbicort 160.  She feels this helps overall with her shortness of breath and dyspnea.  She continues to have chronic dyspnea.  This is likely related to restrictive pattern seen on her 2017 pulmonary function testing.  She also admits that she has very physically deconditioned.  She would like her flu vaccine today.  She is also aware that Dr. Lake Bells is no longer seeing patients in office and is only working inpatient.  She is wondering if she will be established with.  Tests:   PFT: July 2017 pulmonary function testing ratio 77%, FEV1 2.02 L, 71% predicted, FVC 2.63 L 70% predicted, total lung capacity 5.02 L, 81% predicted, DLCO 25.7 74% predicted  11/2015 CT chest > no ILD, air trapping noted, mild cylindrical bronchiectasis, severe tracheomalacia noted, aortic calcification  12/2015 immunoglobulin profile normal, Alpha-1 antitrypsin profile normal  FENO:  No results found for:  NITRICOXIDE  PFT: PFT Results Latest Ref Rng & Units 12/02/2015  FVC-Pre L 2.52  FVC-Predicted Pre % 67  FVC-Post L 2.63  FVC-Predicted Post % 70  Pre FEV1/FVC % % 77  Post FEV1/FCV % % 77  FEV1-Pre L 1.94  FEV1-Predicted Pre % 68  FEV1-Post L 2.02  DLCO UNC% % 74  DLCO COR %Predicted % 94  TLC L 5.02  TLC % Predicted % 81  RV % Predicted % 91    WALK:  No flowsheet data found.  Imaging: No results found.  Lab Results:  CBC    Component Value Date/Time   WBC 8.8 10/04/2018 1402   RBC 4.08 10/04/2018 1402   HGB 12.9 10/04/2018 1402   HCT 38.1 10/04/2018 1402   PLT 263.0 10/04/2018 1402   MCV 93.3 10/04/2018 1402   MCH 30.1 06/10/2018 0605   MCHC 33.8 10/04/2018 1402   RDW 14.9 10/04/2018 1402   LYMPHSABS 1.9 10/04/2018 1402   MONOABS 0.7 10/04/2018 1402   EOSABS 0.4 10/04/2018 1402   BASOSABS 0.1 10/04/2018 1402    BMET    Component Value Date/Time   NA 138 10/04/2018 1402   K 4.1 10/04/2018 1402   CL 100 10/04/2018 1402   CO2 30 10/04/2018 1402   GLUCOSE 98 10/04/2018 1402   BUN 12 10/04/2018 1402   CREATININE 0.58 10/04/2018 1402   CREATININE 0.75 06/05/2013 1558   CALCIUM 8.9 10/04/2018 1402   GFRNONAA 49 (L) 06/12/2018 0816   GFRAA 57 (L) 06/12/2018 0816    BNP No results found  for: BNP  ProBNP    Component Value Date/Time   PROBNP 22.0 04/17/2009 1311    Specialty Problems      Pulmonary Problems   Bronchiectasis ([place])    11/2015 CT chest > no ILD, air trapping noted, mild cylindrical bronchiectasis, severe tracheomalacia noted, aortic calcification 12/2015 immunoglobulin profile normal, Alpha-1 antitrypsin profile normal      Tracheomalacia    11/2015 CT chest > no ILD, air trapping noted, mild cylindrical bronchiectasis, severe tracheomalacia noted, aortic calcification      Solitary pulmonary nodule   Shortness of breath      Allergies  Allergen Reactions  . Nitrofurantoin Other (See Comments)    Severe headache HEADACHE   . Levaquin [Levofloxacin In D5w] Other (See Comments)    Pain in tendons   . Brimonidine Other (See Comments)    Made eyes and surrounding areas RED/ was an eye drop  . Cephalexin Diarrhea and Other (See Comments)    Patient can't remember reaction (per chart at [facility] states diarrhea)  . Erythromycin Ethylsuccinate Hives and Diarrhea  . Percocet [Oxycodone-Acetaminophen] Itching    Immunization History  Administered Date(s) Administered  . Fluad Quad(high Dose 65+) 02/22/2019  . H1N1 06/05/2008  . Influenza Split 02/07/2012  . Influenza Whole 03/10/2000, 01/24/2008, 03/10/2009, 01/20/2010  . Influenza, High Dose Seasonal PF 02/12/2013, 01/31/2017, 01/19/2018  . Influenza,inj,Quad PF,6+ Mos 01/17/2014, 03/27/2015  . Influenza-Unspecified 02/09/2016  . Pneumococcal Conjugate-13 01/17/2014  . Pneumococcal Polysaccharide-23 03/10/2000, 05/02/2007, 01/02/2012  . Td 05/10/1992, 12/25/2007    Past Medical History:  Diagnosis Date  . Allergy   . Anemia   . Anxiety   . Arthritis    right knee;injections every 18months   . Asthma   . Bronchiectasis ([place])   . Cancer ([place]) 1994/1995   HX BREAST CANCER/ right brreast and left breast in 1995  . COMPRESSION FRACTURE, LUMBAR VERTEBRAE 08/21/2008   Qualifier: Diagnosis of  By: Arnoldo Morale MD, Balinda Quails   . Depression   . Diabetes mellitus    takes Amaryl and Januvia daily  . Difficulty sleeping   . Diverticulitis   . Early cataracts, bilateral   . Eczema   . Endometriosis   . Gastritis   . Glaucoma   . Hammer toe   . Headache(784.0)    Migraines  . Hyperlipidemia associated with type 2 diabetes mellitus ([place]) 07/26/2007   Diet/exercise control.    . Hypertension    takes Atenolol daily  . IBS (irritable bowel syndrome)   . Joint pain   . Kidney stone   . Neuropathy   . Open wound of second toe of left foot in past   at pre-op appt, wound appears clear of infection 1 month post removal of toenail, no obvious exudate present nor any  redness,  . Osteomyelitis ([place])    left 2nd toe  . Osteopenia   . PONV (postoperative nausea and vomiting)   . Posterior tibial tendon dysfunction    left foot  . Scoliosis   . Severe esophageal dysplasia   . 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 Thyroid disease   . Vitamin D deficiency    takes Vit d daily  . Weakness    uses a walker and wheelchair    Tobacco History: Social History   Tobacco Use  Smoking Status Never Smoker  Smokeless Tobacco Never Used   Counseling given: Yes   Continue to not smoke  Outpatient Encounter  Medications as of 02/22/2019  Medication Sig  . atenolol (TENORMIN) 25 MG tablet Take 1 tablet by mouth once daily  . Bacillus Coagulans-Inulin (ALIGN PREBIOTIC-PROBIOTIC PO) Take 1 capsule by mouth daily.   . budesonide-formoterol (SYMBICORT) 160-4.5 MCG/ACT inhaler Inhale 2 puffs into the lungs 2 (two) times daily.  . cetirizine (ZYRTEC) 10 MG tablet Take 10 mg by mouth daily.  . Cholecalciferol (VITAMIN D3) 125 MCG (5000 UT) TABS Take 5,000 Units by mouth daily.  . Coenzyme Q10 (COQ10) 200 MG CAPS Take 200 mg by mouth daily.  . colesevelam (WELCHOL) 625 MG tablet TAKE 1 TABLET BY MOUTH THREE TIMES DAILY  . dicyclomine (BENTYL) 10 MG capsule TAKE 2 CAPSULES BY MOUTH 4 TIMES DAILY AS NEEDED FOR  SPASMS  . dorzolamide-timolol (COSOPT) 22.3-6.8 MG/ML ophthalmic solution Place 1 drop into both eyes 2 (two) times daily.   [name] ESTRING 2 MG vaginal ring INSERT 1 RING  INTO VAGINA EVERY 3 MONTHS  . FLUoxetine (PROZAC) 40 MG capsule TAKE 1 CAPSULE BY MOUTH ONCE DAILY. (Patient taking differently: Take 40 mg by mouth daily. )  . glimepiride (AMARYL) 4 MG tablet Take 2 tablets (8 mg total) by mouth daily with breakfast.  . glucose blood (FREESTYLE LITE) test strip USE TO CHECK BLOOD SUGAR EVERY MORNING E11.9  . hydrocortisone 2.5 % cream Apply 1 application topically 2 (two) times daily as needed (skin irritation/rash).   [name]  latanoprost (XALATAN) 0.005 % ophthalmic solution Place 1 drop into both eyes at bedtime.  [name] lisinopril (ZESTRIL) 10 MG tablet Take 1 tablet (10 mg total) by mouth daily. NO PRINT ORDER- to add back to medication list (Patient taking differently: Take 10 mg by mouth at bedtime. NO PRINT ORDER- to add back to medication list)  . Methylcellulose, Laxative, (CITRUCEL PO) Take 3 tablets by mouth at bedtime.  . Multiple Vitamin (MULTIVITAMIN WITH MINERALS) TABS tablet Take 1 tablet by mouth daily. Centrum Silver  . pramipexole (MIRAPEX) 0.5 MG tablet TAKE 1 TABLET BY MOUTH AT BEDTIME  . Respiratory Therapy Supplies (FLUTTER) DEVI Use as directed  . rosuvastatin (CRESTOR) 5 MG tablet Take 1 tablet (5 mg total) by mouth once a week.  . Selenium 200 MCG CAPS Take 200 mcg by mouth at bedtime.   . temazepam (RESTORIL) 15 MG capsule TAKE 2 CAPSULES BY MOUTH AT BEDTIME AS NEEDED FOR  SLEEP  . VICTOZA 18 MG/3ML SOPN INJECT 0.3 ML (1.8 MG) INTO THE SKIN DAILY  . vitamin B-12 (CYANOCOBALAMIN) 1000 MCG tablet Take 1,000 mcg by mouth daily.  . [DISCONTINUED] atenolol (TENORMIN) 25 MG tablet TAKE 1 TABLET BY MOUTH ONCE DAILY (Patient taking differently: Take 25 mg by mouth daily. )   No facility-administered encounter medications on file as of 02/22/2019.      Review of Systems  Review of Systems  Constitutional: Positive for fatigue. Negative for activity change and fever.  HENT: Negative for sinus pressure, sinus pain and sore throat.   Respiratory: Positive for cough and shortness of breath. Negative for wheezing.   Cardiovascular: Negative for chest pain and palpitations.  Gastrointestinal: Negative for diarrhea, nausea and vomiting.  Musculoskeletal: Negative for arthralgias.  Neurological: Negative for dizziness.  Psychiatric/Behavioral: Negative for sleep disturbance. The patient is not nervous/anxious.      Physical Exam  BP 120/68   Pulse 84   Temp (!) 97.4 F (36.3 C) (Temporal)   Ht 6'  (1.829 m)   Wt 248 lb (112.5 kg)   LMP 05/10/1992 Comment:  partial  SpO2 94%   BMI 33.63 kg/m   Wt Readings from Last 5 Encounters:  02/22/19 248 lb (112.5 kg)  01/30/19 248 lb (112.5 kg)  01/17/19 245 lb (111.1 kg)  12/19/18 245 lb (111.1 kg)  12/11/18 245 lb (111.1 kg)    BMI Readings from Last 5 Encounters:  02/22/19 33.63 kg/m  01/30/19 33.63 kg/m  01/17/19 33.23 kg/m  12/19/18 33.23 kg/m  12/11/18 33.23 kg/m     Physical Exam Vitals signs and nursing note reviewed.  Constitutional:      Appearance: Normal appearance. She is obese.     Comments: Chronically ill elderly female  HENT:     Head: Normocephalic and atraumatic.     Right Ear: External ear normal.     Left Ear: External ear normal.     Mouth/Throat:     Mouth: Mucous membranes are moist.     Pharynx: Oropharynx is clear.  Eyes:     Pupils: Pupils are equal, round, and reactive to light.  Neck:     Musculoskeletal: Normal range of motion.  Cardiovascular:     Rate and Rhythm: Normal rate and regular rhythm.     Pulses: Normal pulses.     Heart sounds: Normal heart sounds. No murmur.  Pulmonary:     Effort: Pulmonary effort is normal. No respiratory distress.     Breath sounds: Normal breath sounds. No decreased air movement. No decreased breath sounds, wheezing or rales.  Musculoskeletal:     Right lower leg: Edema (Trace) present.     Left lower leg: Edema (Trace) present.     Comments: Weakness, patient in wheelchair today  Skin:    General: Skin is warm and dry.     Capillary Refill: Capillary refill takes less than 2 seconds.  Neurological:     General: No focal deficit present.     Mental Status: She is alert and oriented to person, place, and time. Mental status is at baseline.     Gait: Gait abnormal.  Psychiatric:        Mood and Affect: Mood normal.        Behavior: Behavior normal.        Thought Content: Thought content normal.        Judgment: Judgment normal.        Assessment & Plan:   Bronchiectasis ([place]) Plan: Continue flutter valve Work on increasing overall physical activity Work on improving overall diet including increasing protein Follow-up with our office in 3 months to establish with Dr. Carlis Abbott  Tracheomalacia Plan: We will continue to monitor clinically  Healthcare maintenance Plan: High-dose flu vaccine today  Physical deconditioning Plan: Work on increasing physical activity   Shortness of breath Patient continues to have ongoing dyspnea, this is likely multifactorial and directly related to obesity, physical deconditioning as well as restrictive patterns of lung disease. Patient feels that Symbicort does help her with her breathing.  Plan: We will continue patient on Symbicort at this time If patient starts to have more exacerbations or recurrence of pneumonia than likely we will need to stop the ICS Consider repeating pulmonary function testing sometime in 2021 to compare to 2017 Patient to start working on increasing overall physical activity    Return in about 3 months (around 05/25/2019), or if symptoms worsen or fail to improve, for Follow up with Dr. Carlis Abbott - 23min OV - Former BQ .   Lauraine Rinne, NP 02/22/2019   This appointment was 28 minutes  long with over 50% of the time in direct face-to-face patient care, assessment, plan of care, and follow-up.

## 2019-02-22 NOTE — Assessment & Plan Note (Signed)
Plan: Work on increasing physical activity

## 2019-02-22 NOTE — Assessment & Plan Note (Signed)
Plan: We will continue to monitor clinically 

## 2019-02-22 NOTE — Assessment & Plan Note (Signed)
Plan: Continue flutter valve Work on increasing overall physical activity Work on improving overall diet including increasing protein Follow-up with our office in 3 months to establish with Dr. Carlis Abbott

## 2019-02-23 NOTE — Progress Notes (Signed)
Reviewed, agree 

## 2019-02-27 ENCOUNTER — Other Ambulatory Visit: Payer: Self-pay | Admitting: Gastroenterology

## 2019-03-01 DIAGNOSIS — H04123 Dry eye syndrome of bilateral lacrimal glands: Secondary | ICD-10-CM | POA: Diagnosis not present

## 2019-03-01 DIAGNOSIS — E119 Type 2 diabetes mellitus without complications: Secondary | ICD-10-CM | POA: Diagnosis not present

## 2019-03-01 DIAGNOSIS — H524 Presbyopia: Secondary | ICD-10-CM | POA: Diagnosis not present

## 2019-03-01 DIAGNOSIS — H401133 Primary open-angle glaucoma, bilateral, severe stage: Secondary | ICD-10-CM | POA: Diagnosis not present

## 2019-03-01 LAB — HM DIABETES EYE EXAM

## 2019-03-12 ENCOUNTER — Other Ambulatory Visit: Payer: Self-pay | Admitting: Family Medicine

## 2019-03-15 ENCOUNTER — Other Ambulatory Visit: Payer: Self-pay | Admitting: Pulmonary Disease

## 2019-03-15 ENCOUNTER — Encounter: Payer: Self-pay | Admitting: Family Medicine

## 2019-03-20 ENCOUNTER — Other Ambulatory Visit: Payer: Self-pay | Admitting: Gastroenterology

## 2019-03-21 ENCOUNTER — Other Ambulatory Visit: Payer: Self-pay

## 2019-03-21 MED ORDER — BUDESONIDE-FORMOTEROL FUMARATE 160-4.5 MCG/ACT IN AERO: 2.0000 | INHALATION_SPRAY | Freq: Two times a day (BID) | RESPIRATORY_TRACT | 3 refills | Status: DC

## 2019-03-22 ENCOUNTER — Other Ambulatory Visit: Payer: Self-pay

## 2019-03-22 ENCOUNTER — Encounter: Payer: Self-pay | Admitting: Family Medicine

## 2019-03-22 MED ORDER — GLIMEPIRIDE 4 MG PO TABS: 8.0000 mg | ORAL_TABLET | Freq: Every day | ORAL | 3 refills | Status: DC

## 2019-04-02 ENCOUNTER — Telehealth: Payer: Self-pay | Admitting: Obstetrics & Gynecology

## 2019-04-02 NOTE — Telephone Encounter (Signed)
I think she needs to be seen.

## 2019-04-02 NOTE — Telephone Encounter (Signed)
Patient has a yeast infection and was prescribed a 'pill'. She would like to change to the suppository version. [place].

## 2019-04-02 NOTE — Telephone Encounter (Signed)
Spoke with pt. Pt started taking diflucan on Sat that she picked up from pharmacy that was on reseve and will take the second pill tomorrow night. Pt states having odor, itching, no discharge per pt, burning/tingling with urination but has subsided since taking diflucan pill on Sat. Wants to know if Dr Sabra Heck will call in Metrogel for possible BV d/t sx? Please advise.   Will route to Dr Sabra Heck for [facility], but will also route to Dr Talbert Nan for possible medication due to Dr Sabra Heck out of office.    Med refill request: Metrogel Last AEX: 02/13/19 Next AEX: not scheduled,but has f/u for Estring on 06/19/2019

## 2019-04-03 ENCOUNTER — Ambulatory Visit (INDEPENDENT_AMBULATORY_CARE_PROVIDER_SITE_OTHER): Payer: Medicare Other | Admitting: Obstetrics and Gynecology

## 2019-04-03 ENCOUNTER — Other Ambulatory Visit: Payer: Self-pay

## 2019-04-03 ENCOUNTER — Encounter: Payer: Self-pay | Admitting: Obstetrics and Gynecology

## 2019-04-03 VITALS — BP 156/88 | HR 88 | Temp 97.9°F

## 2019-04-03 DIAGNOSIS — R300 Dysuria: Secondary | ICD-10-CM | POA: Diagnosis not present

## 2019-04-03 DIAGNOSIS — N3090 Cystitis, unspecified without hematuria: Secondary | ICD-10-CM | POA: Diagnosis not present

## 2019-04-03 DIAGNOSIS — R3915 Urgency of urination: Secondary | ICD-10-CM

## 2019-04-03 LAB — POCT URINALYSIS DIPSTICK
Bilirubin, UA: NEGATIVE
Blood, UA: POSITIVE
Glucose, UA: NEGATIVE
Ketones, UA: NEGATIVE
Nitrite, UA: POSITIVE
Protein, UA: POSITIVE — AB
Spec Grav, UA: 1.01 (ref 1.010–1.025)
Urobilinogen, UA: 0.2 E.U./dL
pH, UA: 5 (ref 5.0–8.0)

## 2019-04-03 MED ORDER — SULFAMETHOXAZOLE-TRIMETHOPRIM 800-160 MG PO TABS: 1.0000 | ORAL_TABLET | Freq: Two times a day (BID) | ORAL | 0 refills | Status: DC

## 2019-04-03 MED ORDER — PHENAZOPYRIDINE HCL 200 MG PO TABS: 200.0000 mg | ORAL_TABLET | Freq: Three times a day (TID) | ORAL | 0 refills | Status: DC | PRN

## 2019-04-03 NOTE — Telephone Encounter (Signed)
Placed call to pt. Pt now states having UTI sx of pressure and urgency. States has taken second diflucan and not better this morning. Pt scheduled to see Dr Talbert Nan on 04/03/19 at 1130.  Routing to provider for final review. Patient is agreeable to disposition. Will close encounter.  FYI Dr Sabra Heck

## 2019-04-03 NOTE — Progress Notes (Signed)
GYNECOLOGY  VISIT   HPI: 78 y.o.   Married White or Caucasian Not Hispanic or Latino  female   G0P0 with Patient's last menstrual period was 05/10/1992.   here for for dysuria, bladder pressure, and urinary frequency. Last Thursday she self treated for yeast. She had itching. Urinary urgency started on Friday, having urinary frequency, some discomfort with voiding, some urge incontinence in the last few days. No fever, a few nights ago she had a temp of 99. No back or abdominal pain.  GYNECOLOGIC HISTORY: Patient's last menstrual period was 05/10/1992. Contraception: Postmenopausal Menopausal hormone therapy:None        OB History    Gravida  0   Para      Term      Preterm      AB      Living        SAB      TAB      Ectopic      Multiple      Live Births           Obstetric Comments  1 adopted           Patient Active Problem List   Diagnosis Date Noted  . Healthcare maintenance 02/22/2019  . Physical deconditioning 02/22/2019  . Shortness of breath 02/22/2019  . Adenomatous polyp 12/20/2018  . Pain due to onychomycosis of toenails of both feet 11/08/2018  . Diabetic neuropathy ([place]) 11/08/2018  . History of total knee replacement, bilateral 11/14/2017  . Personal history of colonic polyps 05/18/2017  . Arthritis of midfoot 03/03/2016  . Hammertoes of both feet 03/03/2016  . Aortic atherosclerosis ([place]) 02/16/2016  . Solitary pulmonary nodule 01/08/2016  . 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  . Bronchiectasis ([place]) 10/18/2015  . IBS (irritable bowel syndrome) 07/17/2014  . Recurrent major depression in full remission ([place]) 01/03/2014  . Posterior tibial tendon dysfunction 01/03/2014  . Osteoarthritis, knee 01/03/2014  . Glaucoma 01/03/2014  . Essential hypertension, benign 01/03/2014  . Obesity (BMI 30-39.9) 06/15/2013  . Primary open-angle glaucoma 08/02/2012  . Foot tendinitis 07/20/2010  . INSOMNIA, CHRONIC 07/25/2009  . Fatty liver 03/18/2009  .  GERD 12/25/2007  . Hyperlipidemia associated with type 2 diabetes mellitus ([place]) 07/26/2007  . ANEMIA, B12 DEFICIENCY 12/29/2006  . Diabetes mellitus type II, controlled ([place]) 12/28/2006  . RESTLESS LEG SYNDROME, MILD 12/28/2006  . NEUROPATHY, IDIOPATHIC PERIPHERAL NEC 12/28/2006  . OSTEOPOROSIS 12/12/2006  . BREAST CANCER, HX OF 12/12/2006    Past Medical History:  Diagnosis Date  . Allergy   . Anemia   . Anxiety   . Arthritis    right knee;injections every 58months   . Asthma   . Bronchiectasis ([place])   . Cancer ([place]) 1994/1995   HX BREAST CANCER/ right brreast and left breast in 1995  . COMPRESSION FRACTURE, LUMBAR VERTEBRAE 08/21/2008   Qualifier: Diagnosis of  By: Arnoldo Morale MD, Balinda Quails   . Depression   . Diabetes mellitus    takes Amaryl and Januvia daily  . Difficulty sleeping   . Diverticulitis   . Early cataracts, bilateral   . Eczema   . Endometriosis   . Gastritis   . Glaucoma   . Hammer toe   . Headache(784.0)    Migraines  . Hyperlipidemia associated with type 2 diabetes mellitus ([place]) 07/26/2007   Diet/exercise control.    . Hypertension    takes Atenolol daily  . IBS (irritable bowel syndrome)   . Joint pain   . Kidney  stone   . Neuropathy   . Open wound of second toe of left foot in past   at pre-op appt, wound appears clear of infection 1 month post removal of toenail, no obvious exudate present nor any redness,  . Osteomyelitis ([place])    left 2nd toe  . Osteopenia   . PONV (postoperative nausea and vomiting)   . Posterior tibial tendon dysfunction    left foot  . Scoliosis   . Severe esophageal dysplasia   . 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 Thyroid disease   . Vitamin D deficiency    takes Vit d daily  . Weakness    uses a walker and wheelchair    Past Surgical History:  Procedure Laterality Date  . ADENOIDECTOMY     at age 37  . BIOPSY  12/19/2018   Procedure: BIOPSY;  Surgeon: Mauri Pole,  MD;  Location: [facility];  Service: Endoscopy;;  . CATARACT EXTRACTION    . CHOLECYSTECTOMY  06/2008  . COLONOSCOPY WITH PROPOFOL N/A 05/17/2017   Procedure: COLONOSCOPY WITH PROPOFOL;  Surgeon: Mauri Pole, MD;  Location: [facility];  Service: Endoscopy;  Laterality: N/A;  PT WILL BE ADMITTED THE DAY BEFORE FOR PREP PER ROBIN KB  . COLONOSCOPY WITH PROPOFOL N/A 12/19/2018   Procedure: COLONOSCOPY WITH PROPOFOL;  Surgeon: Mauri Pole, MD;  Location: [facility];  Service: Endoscopy;  Laterality: N/A;  . DILATION AND CURETTAGE OF UTERUS    . excision on breast     internal infected suture from breast surgery  . excision removed from neck     infected lymph node  . FOOT SURGERY     left foot-cleaning out areas on toes at the wound center-having MRI to determine if osteomyelitis  . HYSTEROSCOPY  06/22/11   PMB submucosal myoma  . JOINT REPLACEMENT     left total shoulder  . LAPAROSCOPIC OOPHORECTOMY Right 12/2004   absent LSO  . LAPAROTOMY    . MASTECTOMY Bilateral 1994 right, 1995 left  . POLYPECTOMY  12/19/2018   Procedure: POLYPECTOMY;  Surgeon: Mauri Pole, MD;  Location: [facility];  Service: Endoscopy;;  . SHOULDER HEMI-ARTHROPLASTY  01/01/2012   Procedure: SHOULDER HEMI-ARTHROPLASTY;  Surgeon: Roseanne Kaufman, MD;  Location: [facility];  Service: Orthopedics;  Laterality: Left;  Left Shoulder Hemi Arthroplasty with Repair and Reconstruction as Necessary   . THYROIDECTOMY     30yrs ago. follows endocrine  . TONSILLECTOMY    . TOTAL KNEE ARTHROPLASTY Right 12/30/2014   Procedure: RIGHT TOTAL KNEE ARTHROPLASTY;  Surgeon: Gaynelle Arabian, MD;  Location: [facility];  Service: Orthopedics;  Laterality: Right;  . TOTAL KNEE ARTHROPLASTY Left 11/29/2016   Procedure: LEFT TOTAL KNEE ARTHROPLASTY;  Surgeon: Gaynelle Arabian, MD;  Location: [facility];  Service: Orthopedics;  Laterality: Left;    Current Outpatient Medications  Medication Sig Dispense Refill  . atenolol (TENORMIN)  25 MG tablet Take 1 tablet by mouth once daily 90 tablet 0  . Bacillus Coagulans-Inulin (ALIGN PREBIOTIC-PROBIOTIC PO) Take 1 capsule by mouth daily.     . budesonide-formoterol (SYMBICORT) 160-4.5 MCG/ACT inhaler Inhale 2 puffs into the lungs 2 (two) times daily. 1 Inhaler 3  . cetirizine (ZYRTEC) 10 MG tablet Take 10 mg by mouth daily.    . Cholecalciferol (VITAMIN D3) 125 MCG (5000 UT) TABS Take 5,000 Units by mouth daily.    . Coenzyme Q10 (COQ10) 200 MG CAPS Take 200 mg by  mouth daily.    . colesevelam (WELCHOL) 625 MG tablet TAKE 1 TABLET BY MOUTH THREE TIMES DAILY 90 tablet 0  . dicyclomine (BENTYL) 10 MG capsule TAKE 2 CAPSULES BY MOUTH 4 TIMES DAILY AS NEEDED 120 capsule 0  . dorzolamide-timolol (COSOPT) 22.3-6.8 MG/ML ophthalmic solution Place 1 drop into both eyes 2 (two) times daily.     [name] ESTRING 2 MG vaginal ring INSERT 1 RING  INTO VAGINA EVERY 3 MONTHS 1 each 3  . FLUoxetine (PROZAC) 40 MG capsule TAKE 1 CAPSULE BY MOUTH ONCE DAILY. (Patient taking differently: Take 40 mg by mouth daily. ) 90 capsule 3  . glimepiride (AMARYL) 4 MG tablet Take 2 tablets (8 mg total) by mouth daily with breakfast. 180 tablet 3  . glucose blood (FREESTYLE LITE) test strip USE TO CHECK BLOOD SUGAR EVERY MORNING E11.9 100 each 3  . hydrocortisone 2.5 % cream Apply 1 application topically 2 (two) times daily as needed (skin irritation/rash).     [name] latanoprost (XALATAN) 0.005 % ophthalmic solution Place 1 drop into both eyes at bedtime.    [name] lisinopril (ZESTRIL) 10 MG tablet Take 1 tablet (10 mg total) by mouth daily. NO PRINT ORDER- to add back to medication list (Patient taking differently: Take 10 mg by mouth at bedtime. NO PRINT ORDER- to add back to medication list) 90 tablet 3  . Methylcellulose, Laxative, (CITRUCEL PO) Take 3 tablets by mouth at bedtime.    . Multiple Vitamin (MULTIVITAMIN WITH MINERALS) TABS tablet Take 1 tablet by mouth daily. Centrum Silver    . pramipexole (MIRAPEX) 0.5 MG  tablet TAKE 1 TABLET BY MOUTH AT BEDTIME 90 tablet 0  . Respiratory Therapy Supplies (FLUTTER) DEVI Use as directed 1 each 0  . Selenium 200 MCG CAPS Take 200 mcg by mouth at bedtime.     . temazepam (RESTORIL) 15 MG capsule TAKE 2 CAPSULES BY MOUTH AT BEDTIME AS NEEDED FOR  SLEEP 60 capsule 5  . VICTOZA 18 MG/3ML SOPN INJECT 0.3 ML (1.8 MG) INTO THE SKIN DAILY 9 mL 0  . vitamin B-12 (CYANOCOBALAMIN) 1000 MCG tablet Take 1,000 mcg by mouth daily.    . rosuvastatin (CRESTOR) 5 MG tablet Take 1 tablet (5 mg total) by mouth once a week. (Patient not taking: Reported on 04/03/2019) 13 tablet 3   No current facility-administered medications for this visit.      ALLERGIES: Nitrofurantoin, Levaquin [levofloxacin in d5w], Brimonidine, Cephalexin, Erythromycin ethylsuccinate, and Percocet [oxycodone-acetaminophen]  Family History  Problem Relation Age of Onset  . Arthritis Mother   . COPD Father   . Heart disease Father        MI 1 - states he died of lockjaw  . Hypertension Father   . Hyperlipidemia Father   . Pancreatic cancer Paternal Grandfather   . Colon cancer Neg Hx   . Esophageal cancer Neg Hx   . Rectal cancer Neg Hx   . Stomach cancer Neg Hx     Social History   Socioeconomic History  . Marital status: Married    Spouse name: Not on file  . Number of children: Not on file  . Years of education: Not on file  . Highest education level: Not on file  Occupational History  . Not on file  Social Needs  . Financial resource strain: Not on file  . Food insecurity    Worry: Not on file    Inability: Not on file  . Transportation needs  Medical: Not on file    Non-medical: Not on file  Tobacco Use  . Smoking status: Never Smoker  . Smokeless tobacco: Never Used  Substance and Sexual Activity  . Alcohol use: No  . Drug use: No  . Sexual activity: Not Currently    Partners: Male    Birth control/protection: Post-menopausal  Lifestyle  . Physical activity    Days per  week: Not on file    Minutes per session: Not on file  . Stress: Not on file  Relationships  . Social Herbalist on phone: Not on file    Gets together: Not on file    Attends religious service: Not on file    Active member of club or organization: Not on file    Attends meetings of clubs or organizations: Not on file    Relationship status: Not on file  . Intimate partner violence    Fear of current or ex partner: Not on file    Emotionally abused: Not on file    Physically abused: Not on file    Forced sexual activity: Not on file  Other Topics Concern  . Not on file  Social History Narrative   Married 39 years in 2015. 1 adopted son. 1 grandkid (74 yo grandson)      Retired from Development worker, international aid at [employer]: fashion, tv shopping, sewing, decorate    Review of Systems  Constitutional: Negative.   HENT: Negative.   Eyes: Negative.   Respiratory: Negative.   Cardiovascular: Negative.   Gastrointestinal: Negative.   Genitourinary: Positive for dysuria, frequency and urgency.  Musculoskeletal: Negative.   Skin: Negative.   Neurological: Negative.   Endo/Heme/Allergies: Negative.   Psychiatric/Behavioral: Negative.     PHYSICAL EXAMINATION:    BP (!) 156/88 (BP Location: Right Arm, Patient Position: Sitting, Cuff Size: Large)   Pulse 88   Temp 97.9 F (36.6 C) (Skin)   LMP 05/10/1992 Comment: partial    General appearance: alert, cooperative and appears stated age CVA: not tender Abdomen: soft, non-tender; non distended, no masses,  no organomegaly   Urine dip: +protein, +nitrate, 1+ leuk, negative blood  ASSESSMENT Cystitis   PLAN Treat with Bactrim and Pyridium Send urine for ua, c&s She will call if she isn't feeling better in 48 hours   An After Visit Summary was printed and given to the patient.  CC: Dr Sabra Heck

## 2019-04-03 NOTE — Patient Instructions (Signed)

## 2019-04-04 LAB — URINALYSIS, MICROSCOPIC ONLY: WBC, UA: >30 /hpf — AB (ref 0–5)

## 2019-04-05 LAB — URINE CULTURE

## 2019-04-07 ENCOUNTER — Other Ambulatory Visit: Payer: Self-pay | Admitting: Gastroenterology

## 2019-04-09 ENCOUNTER — Telehealth: Payer: Self-pay | Admitting: Obstetrics and Gynecology

## 2019-04-09 DIAGNOSIS — N3090 Cystitis, unspecified without hematuria: Secondary | ICD-10-CM

## 2019-04-09 MED ORDER — FLUCONAZOLE 150 MG PO TABS: 150.0000 mg | ORAL_TABLET | Freq: Once | ORAL | 0 refills | Status: AC

## 2019-04-09 MED ORDER — SULFAMETHOXAZOLE-TRIMETHOPRIM 800-160 MG PO TABS: 1.0000 | ORAL_TABLET | Freq: Two times a day (BID) | ORAL | 0 refills | Status: DC

## 2019-04-09 NOTE — Telephone Encounter (Signed)
Spoke with pt. Lab results given per provider via mychart. Pt verbalized understanding. Pt still having only urgency and has finished Bactrim Rx. Wants to know if needs another or more antibiotic and if does then request yeast meds to go with it.   Will route to Dr Talbert Nan for recommendations. Please advise.

## 2019-04-09 NOTE — Telephone Encounter (Signed)
Please call in 3 more days of Bactrim DS, 1 tab po BID, #6. You can call in one diflucan 150 mg po x 1. If she isn't feeling better after this, she should be seen.

## 2019-04-09 NOTE — Telephone Encounter (Signed)
Patient was seen 04/03/19 for a UTI and still has is unable to retreive her results from [place].

## 2019-04-09 NOTE — Telephone Encounter (Signed)
Spoke to pt. Given update on meds.   Sent Rx for Bactrim DS and Diflucan per Dr Gentry Fitz orders to pharmacy on file. Pt aware and agreeable to call back if no better.   Will route to Dr Talbert Nan for final review and will close encounter.

## 2019-04-11 ENCOUNTER — Other Ambulatory Visit: Payer: Self-pay | Admitting: Family Medicine

## 2019-04-12 ENCOUNTER — Other Ambulatory Visit: Payer: Self-pay | Admitting: Gastroenterology

## 2019-05-02 ENCOUNTER — Other Ambulatory Visit: Payer: Self-pay | Admitting: Family Medicine

## 2019-05-08 ENCOUNTER — Other Ambulatory Visit: Payer: Self-pay | Admitting: Family Medicine

## 2019-05-13 ENCOUNTER — Other Ambulatory Visit: Payer: Self-pay | Admitting: Gastroenterology

## 2019-05-15 ENCOUNTER — Other Ambulatory Visit: Payer: Self-pay | Admitting: Family Medicine

## 2019-05-15 ENCOUNTER — Ambulatory Visit: Payer: Medicare Other | Admitting: Podiatry

## 2019-05-23 ENCOUNTER — Other Ambulatory Visit: Payer: Self-pay | Admitting: Family Medicine

## 2019-06-03 ENCOUNTER — Ambulatory Visit: Payer: Medicare PPO | Attending: Internal Medicine

## 2019-06-03 DIAGNOSIS — Z23 Encounter for immunization: Secondary | ICD-10-CM

## 2019-06-03 NOTE — Progress Notes (Signed)
   Covid-19 Vaccination Clinic  Name:  [name]    MRN: [id number] DOB: May 22, 1940  06/03/2019  [name] was observed post Covid-19 immunization for 15 minutes without incidence. She was provided with Vaccine Information Sheet and instruction to access the V-Safe system.   [name] was instructed to call 911 with any severe reactions post vaccine: [name] Difficulty breathing  . Swelling of your face and throat  . A fast heartbeat  . A bad rash all over your body  . Dizziness and weakness    Immunizations Administered    Name Date Dose VIS Date Route   Pfizer COVID-19 Vaccine 06/03/2019 12:01 PM 0.3 mL 04/20/2019 Intramuscular   Manufacturer: [place]   Lot: [id number]   [place]: [id number]

## 2019-06-11 ENCOUNTER — Other Ambulatory Visit: Payer: Self-pay | Admitting: Family Medicine

## 2019-06-11 ENCOUNTER — Other Ambulatory Visit: Payer: Self-pay | Admitting: Gastroenterology

## 2019-06-14 ENCOUNTER — Telehealth: Payer: Self-pay | Admitting: Family Medicine

## 2019-06-14 NOTE — Telephone Encounter (Signed)
Rx refilled. Pt needs OV, please call to schedule, thanks!

## 2019-06-14 NOTE — Telephone Encounter (Signed)
Called pt and scheduled appt

## 2019-06-14 NOTE — Telephone Encounter (Signed)
I called the patient to schedule AWV with Loma Sousa ([place]), but there was no answer and no option to leave a message. If patient calls back, please schedule Medicare Wellness Visit at next available opening. Last AWV 01/19/18 [facility] ([place])

## 2019-06-18 ENCOUNTER — Encounter: Payer: Self-pay | Admitting: Family Medicine

## 2019-06-18 ENCOUNTER — Other Ambulatory Visit: Payer: Self-pay

## 2019-06-18 ENCOUNTER — Ambulatory Visit (INDEPENDENT_AMBULATORY_CARE_PROVIDER_SITE_OTHER): Payer: Medicare PPO | Admitting: Family Medicine

## 2019-06-18 VITALS — BP 132/76 | HR 87 | Temp 97.7°F | Ht 72.0 in | Wt 255.0 lb

## 2019-06-18 DIAGNOSIS — M810 Age-related osteoporosis without current pathological fracture: Secondary | ICD-10-CM | POA: Diagnosis not present

## 2019-06-18 DIAGNOSIS — E1159 Type 2 diabetes mellitus with other circulatory complications: Secondary | ICD-10-CM | POA: Diagnosis not present

## 2019-06-18 DIAGNOSIS — E119 Type 2 diabetes mellitus without complications: Secondary | ICD-10-CM

## 2019-06-18 DIAGNOSIS — D518 Other vitamin B12 deficiency anemias: Secondary | ICD-10-CM | POA: Diagnosis not present

## 2019-06-18 DIAGNOSIS — I700 Atherosclerosis of aorta: Secondary | ICD-10-CM | POA: Diagnosis not present

## 2019-06-18 DIAGNOSIS — E785 Hyperlipidemia, unspecified: Secondary | ICD-10-CM

## 2019-06-18 DIAGNOSIS — I10 Essential (primary) hypertension: Secondary | ICD-10-CM

## 2019-06-18 DIAGNOSIS — E1169 Type 2 diabetes mellitus with other specified complication: Secondary | ICD-10-CM

## 2019-06-18 DIAGNOSIS — I152 Hypertension secondary to endocrine disorders: Secondary | ICD-10-CM

## 2019-06-18 LAB — LIPID PANEL
Cholesterol: 167 mg/dL (ref 0–200)
HDL: 46.6 mg/dL (ref >39.00)
LDL Cholesterol: 86 mg/dL (ref 0–99)
NonHDL: 120.26
Total CHOL/HDL Ratio: 4
Triglycerides: 172 mg/dL — ABNORMAL HIGH (ref 0.0–149.0)
VLDL: 34.4 mg/dL (ref 0.0–40.0)

## 2019-06-18 LAB — COMPREHENSIVE METABOLIC PANEL
ALT: 24 U/L (ref 0–35)
AST: 17 U/L (ref 0–37)
Albumin: 3.8 g/dL (ref 3.5–5.2)
Alkaline Phosphatase: 86 U/L (ref 39–117)
BUN: 14 mg/dL (ref 6–23)
CO2: 30 mEq/L (ref 19–32)
Calcium: 9.4 mg/dL (ref 8.4–10.5)
Chloride: 100 mEq/L (ref 96–112)
Creatinine, Ser: 0.59 mg/dL (ref 0.40–1.20)
GFR: 98.22 mL/min (ref >60.00)
Glucose, Bld: 176 mg/dL — ABNORMAL HIGH (ref 70–99)
Potassium: 4.4 mEq/L (ref 3.5–5.1)
Sodium: 138 mEq/L (ref 135–145)
Total Bilirubin: 0.4 mg/dL (ref 0.2–1.2)
Total Protein: 6.2 g/dL (ref 6.0–8.3)

## 2019-06-18 LAB — VITAMIN B12: Vitamin B-12: 998 pg/mL — ABNORMAL HIGH (ref 211–911)

## 2019-06-18 LAB — VITAMIN D 25 HYDROXY (VIT D DEFICIENCY, FRACTURES): VITD: 38.07 ng/mL (ref 30.00–100.00)

## 2019-06-18 LAB — HEMOGLOBIN A1C: Hgb A1c MFr Bld: 7.8 % — ABNORMAL HIGH (ref 4.6–6.5)

## 2019-06-18 MED ORDER — LOVASTATIN 10 MG PO TABS: 10.0000 mg | ORAL_TABLET | ORAL | 3 refills | Status: DC

## 2019-06-18 MED ORDER — TEMAZEPAM 15 MG PO CAPS: ORAL_CAPSULE | ORAL | 5 refills | Status: DC

## 2019-06-18 NOTE — Progress Notes (Signed)
Phone [phone] In person visit   Subjective:   [name] is a 79 y.o. year old very pleasant female patient who presents for/with See problem oriented charting Chief Complaint  Patient presents with  . Diabetes  . Hypertension   This visit occurred during the SARS-CoV-2 public health emergency.  Safety protocols were in place, including screening questions prior to the visit, additional usage of staff PPE, and extensive cleaning of exam room while observing appropriate contact time as indicated for disinfecting solutions.   Past Medical History-  Patient Active Problem List   Diagnosis Date Noted  . 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    Priority: High  . Bronchiectasis ([place]) 10/18/2015    Priority: High  . Diabetes mellitus type II, controlled ([place]) 12/28/2006    Priority: High  . Adenomatous polyp 12/20/2018    Priority: Medium  . Personal history of colonic polyps 05/18/2017    Priority: Medium  . Aortic atherosclerosis ([place]) 02/16/2016    Priority: Medium  . Solitary pulmonary nodule 01/08/2016    Priority: Medium  . IBS (irritable bowel syndrome) 07/17/2014    Priority: Medium  . Recurrent major depression in partial remission ([place]) 01/03/2014    Priority: Medium  . Hypertension associated with diabetes ([place]) 01/03/2014    Priority: Medium  . Primary open-angle glaucoma 08/02/2012    Priority: Medium  . Fatty liver 03/18/2009    Priority: Medium  . Hyperlipidemia associated with type 2 diabetes mellitus ([place]) 07/26/2007    Priority: Medium  . ANEMIA, B12 DEFICIENCY 12/29/2006    Priority: Medium  . RESTLESS LEG SYNDROME, MILD 12/28/2006    Priority: Medium  . Osteoporosis 12/12/2006    Priority: Medium  . History of total knee replacement, bilateral 11/14/2017    Priority: Low  . Arthritis of midfoot 03/03/2016    Priority: Low  . Hammertoes of both feet 03/03/2016    Priority: Low  . Posterior tibial tendon dysfunction 01/03/2014    Priority: Low  .  Osteoarthritis, knee 01/03/2014    Priority: Low  . Glaucoma 01/03/2014    Priority: Low  . Obesity (BMI 30-39.9) 06/15/2013    Priority: Low  . Foot tendinitis 07/20/2010    Priority: Low  . INSOMNIA, CHRONIC 07/25/2009    Priority: Low  . GERD 12/25/2007    Priority: Low  . NEUROPATHY, IDIOPATHIC PERIPHERAL NEC 12/28/2006    Priority: Low  . BREAST CANCER, HX OF 12/12/2006    Priority: Low  . Healthcare maintenance 02/22/2019  . Physical deconditioning 02/22/2019  . Shortness of breath 02/22/2019  . Pain due to onychomycosis of toenails of both feet 11/08/2018  . Diabetic neuropathy ([place]) 11/08/2018    Medications- reviewed and updated Current Outpatient Medications  Medication Sig Dispense Refill  . atenolol (TENORMIN) 25 MG tablet Take 1 tablet by mouth once daily 90 tablet 0  . Bacillus Coagulans-Inulin (ALIGN PREBIOTIC-PROBIOTIC PO) Take 1 capsule by mouth daily.     . budesonide-formoterol (SYMBICORT) 160-4.5 MCG/ACT inhaler Inhale 2 puffs into the lungs 2 (two) times daily. 1 Inhaler 3  . cetirizine (ZYRTEC) 10 MG tablet Take 10 mg by mouth daily.    . Cholecalciferol (VITAMIN D3) 125 MCG (5000 UT) TABS Take 5,000 Units by mouth daily.    . Coenzyme Q10 (COQ10) 200 MG CAPS Take 200 mg by mouth daily.    . colesevelam (WELCHOL) 625 MG tablet TAKE 1 TABLET BY MOUTH THREE TIMES DAILY 90 tablet 0  . dicyclomine (BENTYL) 10 MG capsule TAKE 2  CAPSULES BY MOUTH 4 TIMES DAILY AS NEEDED 120 capsule 0  . dorzolamide-timolol (COSOPT) 22.3-6.8 MG/ML ophthalmic solution Place 1 drop into both eyes 2 (two) times daily.     [name] ESTRING 2 MG vaginal ring INSERT 1 RING  INTO VAGINA EVERY 3 MONTHS 1 each 3  . fluconazole (DIFLUCAN) 200 MG tablet Take 200 mg by mouth daily.    [name] FLUoxetine (PROZAC) 40 MG capsule Take 1 capsule by mouth once daily 90 capsule 0  . glimepiride (AMARYL) 4 MG tablet Take 2 tablets (8 mg total) by mouth daily with breakfast. 180 tablet 3  . glucose blood  (FREESTYLE LITE) test strip USE TO CHECK BLOOD SUGAR EVERY MORNING E11.9 100 each 3  . hydrocortisone 2.5 % cream Apply 1 application topically 2 (two) times daily as needed (skin irritation/rash).     [name] latanoprost (XALATAN) 0.005 % ophthalmic solution Place 1 drop into both eyes at bedtime.    [name] lisinopril (ZESTRIL) 10 MG tablet Take 1 tablet by mouth once daily 90 tablet 0  . lovastatin (MEVACOR) 10 MG tablet Take 1 tablet (10 mg total) by mouth once a week. 13 tablet 3  . Methylcellulose, Laxative, (CITRUCEL PO) Take 3 tablets by mouth at bedtime.    . Multiple Vitamin (MULTIVITAMIN WITH MINERALS) TABS tablet Take 1 tablet by mouth daily. Centrum Silver    . phenazopyridine (PYRIDIUM) 200 MG tablet Take 1 tablet (200 mg total) by mouth 3 (three) times daily as needed. 6 tablet 0  . pramipexole (MIRAPEX) 0.5 MG tablet TAKE 1 TABLET BY MOUTH AT BEDTIME 90 tablet 0  . Respiratory Therapy Supplies (FLUTTER) DEVI Use as directed 1 each 0  . Selenium 200 MCG CAPS Take 200 mcg by mouth at bedtime.     . sulfamethoxazole-trimethoprim (BACTRIM DS) 800-160 MG tablet Take 1 tablet by mouth 2 (two) times daily. One PO BID x 3 days 6 tablet 0  . temazepam (RESTORIL) 15 MG capsule TAKE 1 CAPSULE BY MOUTH AT BEDTIME AS NEEDED FOR  SLEEP 90 capsule 5  . VICTOZA 18 MG/3ML SOPN INJECT 0.3ML (1.8MG ) INTO THE SKIN DAILY. APPOINTMENT NEEDED FOR FUTURE REFILLS 9 mL 0  . vitamin B-12 (CYANOCOBALAMIN) 1000 MCG tablet Take 1,000 mcg by mouth daily.     No current facility-administered medications for this visit.     Objective:  BP 132/76   Pulse 87   Temp 97.7 F (36.5 C) (Temporal)   Ht 6' (1.829 m)   Wt 255 lb (115.7 kg)   LMP 05/10/1992 Comment: partial  SpO2 95%   BMI 34.58 kg/m  Gen: NAD, resting comfortably Ext: no edema Skin: warm, dry Neuro: sitting in wheelchair Psych: seems down Diabetic Foot Exam - Simple   Simple Foot Form Visual Inspection No deformities, no ulcerations, no other skin  breakdown bilaterally: Yes Sensation Testing See comments: Yes Pulse Check Posterior Tibialis and Dorsalis pulse intact bilaterally: Yes See comments: Yes Comments Monofilament- not able to feel screening at all in both feet.        Assessment and Plan   # fall risk- legs feel weak because not exercising- had fall at home going downstairs- now has risk. She agrees to try 10-15 mins on her bike 5 days a week. Doing PT in office was bothersome as had to wear mask  # Diabetes with neuropathy/morbid obesity S: mild poorly  controlled on last check. Now on on Victoza daily and Glimepiride 8 mg   daily, pt states she  is doing well on the Victoza and Glimipride. She has been checking CBG at home and they have been high ranging in the 160-170. She feels like sugars higher, not exercising, gaining weight and likely will be up  Patient admits to decrease sensation in bottom of feet Lab Results  Component Value Date   HGBA1C 7.5 (H) 10/04/2018  A/P: suspect poorly controlled diabetes with sedentary activity/lack of exercise/patient reported weight gain -Patient is opposed to starting insulin -She feels like she eats reasonably well other than the ice cream she has most days.  She knows exercise would help and wants this to be the primary push as long as A1c is likely within a point of above level - prone to yeast infections so declines SGLT2 inhibitor - has done diabetes education and declines repeat  Diabetic neuropathy noted with decreased sensation to monofilament exam-recommended consistency with checking feet daily  # hypertension S:controlled on Atenolol 25 mg daily and Lisinopril 10 mg daily. She does not check readings at home. She has not had any SOB or chest pain  A/P: hop    # Hyperlipidemia/aortic atherosclerosis noted on prior imaging S:Last appointment was started on Rosuvastatin 5mg  weekly- felt a generalized weakness on this and when she stopped it resolved.  A/P: We will  trial alternate statin-lovastatin 10 mg once a week instead of rosuvastatin 5 mg once a week.  Fluconazole is on her list but is not taking that regularly-uses for yeast infections occasionally-with such sparing lovastatin use I do not think this will be an issue -Statin ideal given diabetes history as well as aortic atherosclerosis as above  # Depression  S:Taking Fluoxetine 40 mg daily. She has had increased symptoms after Covid. Some issues with sleep- temazepam only covered at 1 pill. Sleeping 12-3 and wakes up then wakes up at 6 and 9.   Saw Lattie Haw for a bit our counselor here but not super helpful.  Depression screen [facility] 2/9 06/18/2019 01/30/2019 08/31/2018  Decreased Interest 3 1 0  Down, Depressed, Hopeless 3 1 0  PHQ - 2 Score 6 2 0  Altered sleeping 0 1 0  Tired, decreased energy 3 1 3   Change in appetite 0 1 0  Feeling bad or failure about yourself  0 0 0  Trouble concentrating 3 0 0  Moving slowly or fidgety/restless 0 0 0  Suicidal thoughts 0 0 0  PHQ-9 Score 12 5 3   Difficult doing work/chores Not difficult at all Somewhat difficult Not difficult at all  Some recent data might be hidden  A/P: Poor control.  A lot of this is situational with COVID-19.  She wants to continue current medication for now.  Declines continued counseling as did not find super helpful.  We discussed increasing exercise and hoping with her second vaccine she can become a little more active and get back involved in church -Patient has a lot of negative self speak/self-deprecating statements-I discouraged these as I want her to speak positively about herself. -encouraged bedtime closer to 9-10 and waking up closer to when sun rises (shes going to bed around midnight). Should try to get sun exposure.  -Insurance no longer covering temazepam 30 mg so 250 mg tablets-they will cover 1 tablet at a time-this was refilled for patient  Recommended follow up: 14-week follow-up recommended Future Appointments  Date  Time Provider [place]  06/19/2019  4:00 PM Megan Salon, MD [place] None  06/25/2019 10:00 AM [place]  07/04/2019  3:45 PM Gardiner Barefoot, DPM [facility]  09/24/2019  4:00 PM Yong Channel, Brayton Mars, MD [facility]    Lab/Order associations:   ICD-10-CM   1. Hypertension associated with diabetes ([place])  E11.59    I10   2. Hyperlipidemia associated with type 2 diabetes mellitus ([place])  E11.69    E78.5   3. Age-related osteoporosis without current pathological fracture  M81.0 VITAMIN D 25 Hydroxy (Vit-D Deficiency, Fractures)    VITAMIN D 25 Hydroxy (Vit-D Deficiency, Fractures)  4. ANEMIA, B12 DEFICIENCY  D51.8 Vitamin B12  5. Controlled type 2 diabetes mellitus without complication, without long-term current use of insulin ([facility])  E11.9 Lipid panel    Comprehensive metabolic panel    Hemoglobin A1c  6. Aortic atherosclerosis ([facility]) Chronic I70.0     Meds ordered this encounter  Medications  . temazepam (RESTORIL) 15 MG capsule    Sig: TAKE 1 CAPSULE BY MOUTH AT BEDTIME AS NEEDED FOR  SLEEP    Dispense:  90 capsule    Refill:  5    3 month supply if possible due to covid 19 and her health risks  . lovastatin (MEVACOR) 10 MG tablet    Sig: Take 1 tablet (10 mg total) by mouth once a week.    Dispense:  13 tablet    Refill:  3   Return precautions advised.  [name], MD

## 2019-06-18 NOTE — Patient Instructions (Addendum)
Health Maintenance Due  Topic Date Due  . Samul Dada will get at pharmacy  12/24/2017  . FOOT EXAM done in office  01/20/2019  . HEMOGLOBIN A1C - with labs today 04/06/2019   Please stop by lab before you go- labs from 01/2019 plus vitamin D from today If you do not have mychart- we will call you about results within 5 business days of [name] receiving them.  If you have mychart- we will send your results within 3 business days of [name] receiving them.  If abnormal or we want to clarify a result, we will call or mychart you to make sure you receive the message.  If you have questions or concerns or don't hear within 5-7 days, please send [name] a message or call us.   Lets start on the bike at least 10 minutes daily. Can work up to at least 30 minutes a day. Maybe more if needed for blood sugar control. Also hopeful this helps with depression plus having more freedom after 2nd vaccine  Stop rosuvastatin 5 mg. Start lovastatin 10mg  once a week  Schedule visit with courtney by phone for wellness visit.   Recommended follow up: Return in about 14 weeks (around 09/24/2019) for follow up- or sooner if needed.

## 2019-06-18 NOTE — Assessment & Plan Note (Addendum)
#   Diabetes with neuropathy  S: mild poorly  controlled on last check. Now on on Victoza daily and Glimepiride 8 mg   daily, pt states she is doing well on the Victoza and Glimipride. She has been checking CBG at home and they have been high ranging in the 160-170. She feels like sugars higher, not exercising, gaining weight and likely will be up  Patient admits to decrease sensation in bottom of feet Lab Results  Component Value Date   HGBA1C 7.5 (H) 10/04/2018  A/P: suspect poorly controlled diabetes with sedentary activity/lack of exercise/patient reported weight gain -Patient is opposed to starting insulin -She feels like she eats reasonably well other than the ice cream she has most days.  She knows exercise would help and wants this to be the primary push as long as A1c is likely within a point of above level - prone to yeast infections so declines SGLT2 inhibitor - has done diabetes education and declines repeat  Diabetic neuropathy versus neuropathy after prior chemotherapy noted with decreased sensation to monofilament exam-recommended consistency with checking feet daily

## 2019-06-19 ENCOUNTER — Encounter: Payer: Self-pay | Admitting: Obstetrics & Gynecology

## 2019-06-19 ENCOUNTER — Ambulatory Visit: Payer: Medicare PPO | Admitting: Obstetrics & Gynecology

## 2019-06-19 VITALS — BP 118/68 | HR 84 | Temp 97.9°F | Ht 72.0 in | Wt 255.0 lb

## 2019-06-19 DIAGNOSIS — N952 Postmenopausal atrophic vaginitis: Secondary | ICD-10-CM | POA: Diagnosis not present

## 2019-06-19 NOTE — Progress Notes (Signed)
GYNECOLOGY  VISIT  HPI: 79 y.o. G0P0 Married White or Caucasian female here for Estring removal and replacement.  Patient has history of atrophic vaginitis and has done quite well with Estring use.  She has it removed and replaced every 3 to 4 months.  She has had this done for years.  She simply cannot remove the Estring due to the length of her arms and fingers and placement of Estring in the vagina.  She otherwise has no complaints today.  She is in a wheelchair today but reports her breathing is much better she overall has felt better over the last few months.  She and her husband have started the Covid vaccination series.  She received the [employer] vaccine.  GYNECOLOGIC HISTORY: Patient's last menstrual period was 05/10/1992. Contraception: Postmenopausal Menopausal hormone therapy: Estring  Patient Active Problem List   Diagnosis Date Noted  . Healthcare maintenance 02/22/2019  . Physical deconditioning 02/22/2019  . Shortness of breath 02/22/2019  . Adenomatous polyp 12/20/2018  . Pain due to onychomycosis of toenails of both feet 11/08/2018  . Diabetic neuropathy ([place]) 11/08/2018  . History of total knee replacement, bilateral 11/14/2017  . Personal history of colonic polyps 05/18/2017  . Arthritis of midfoot 03/03/2016  . Hammertoes of both feet 03/03/2016  . Aortic atherosclerosis ([place]) 02/16/2016  . Solitary pulmonary nodule 01/08/2016  . 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  . Bronchiectasis ([place]) 10/18/2015  . IBS (irritable bowel syndrome) 07/17/2014  . Recurrent major depression in partial remission ([place]) 01/03/2014  . Posterior tibial tendon dysfunction 01/03/2014  . Osteoarthritis, knee 01/03/2014  . Glaucoma 01/03/2014  . Hypertension associated with diabetes ([place]) 01/03/2014  . Obesity (BMI 30-39.9) 06/15/2013  . Primary open-angle glaucoma 08/02/2012  . Foot tendinitis 07/20/2010  . INSOMNIA, CHRONIC 07/25/2009  . Fatty liver 03/18/2009  . GERD 12/25/2007  .  Hyperlipidemia associated with type 2 diabetes mellitus ([place]) 07/26/2007  . ANEMIA, B12 DEFICIENCY 12/29/2006  . Diabetes mellitus type II, controlled ([place]) 12/28/2006  . RESTLESS LEG SYNDROME, MILD 12/28/2006  . NEUROPATHY, IDIOPATHIC PERIPHERAL NEC 12/28/2006  . Osteoporosis 12/12/2006  . BREAST CANCER, HX OF 12/12/2006    Past Medical History:  Diagnosis Date  . Allergy   . Anemia   . Anxiety   . Arthritis    right knee;injections every 55months   . Asthma   . Bronchiectasis ([place])   . Cancer ([place]) 1994/1995   HX BREAST CANCER/ right brreast and left breast in 1995  . COMPRESSION FRACTURE, LUMBAR VERTEBRAE 08/21/2008   Qualifier: Diagnosis of  By: Arnoldo Morale MD, Balinda Quails   . Depression   . Diabetes mellitus    takes Amaryl and Januvia daily  . Difficulty sleeping   . Diverticulitis   . Early cataracts, bilateral   . Eczema   . Endometriosis   . Gastritis   . Glaucoma   . Hammer toe   . Headache(784.0)    Migraines  . Hyperlipidemia associated with type 2 diabetes mellitus ([place]) 07/26/2007   Diet/exercise control.    . Hypertension    takes Atenolol daily  . IBS (irritable bowel syndrome)   . Joint pain   . Kidney stone   . Neuropathy   . Open wound of second toe of left foot in past   at pre-op appt, wound appears clear of infection 1 month post removal of toenail, no obvious exudate present nor any redness,  . Osteomyelitis ([place])    left 2nd toe  . Osteopenia   . PONV (postoperative  nausea and vomiting)   . Posterior tibial tendon dysfunction    left foot  . Scoliosis   . Severe esophageal dysplasia   . 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 Thyroid disease   . Vitamin D deficiency    takes Vit d daily  . Weakness    uses a walker and wheelchair    Past Surgical History:  Procedure Laterality Date  . ADENOIDECTOMY     at age 1  . BIOPSY  12/19/2018   Procedure: BIOPSY;  Surgeon: Mauri Pole, MD;  Location: [facility]COPY;  Service: Endoscopy;;  . CATARACT EXTRACTION    . CHOLECYSTECTOMY  06/2008  . COLONOSCOPY WITH PROPOFOL N/A 05/17/2017   Procedure: COLONOSCOPY WITH PROPOFOL;  Surgeon: Mauri Pole, MD;  Location: [facility];  Service: Endoscopy;  Laterality: N/A;  PT WILL BE ADMITTED THE DAY BEFORE FOR PREP PER ROBIN KB  . COLONOSCOPY WITH PROPOFOL N/A 12/19/2018   Procedure: COLONOSCOPY WITH PROPOFOL;  Surgeon: Mauri Pole, MD;  Location: [facility];  Service: Endoscopy;  Laterality: N/A;  . DILATION AND CURETTAGE OF UTERUS    . excision on breast     internal infected suture from breast surgery  . excision removed from neck     infected lymph node  . FOOT SURGERY     left foot-cleaning out areas on toes at the wound center-having MRI to determine if osteomyelitis  . HYSTEROSCOPY  06/22/11   PMB submucosal myoma  . JOINT REPLACEMENT     left total shoulder  . LAPAROSCOPIC OOPHORECTOMY Right 12/2004   absent LSO  . LAPAROTOMY    . MASTECTOMY Bilateral 1994 right, 1995 left  . POLYPECTOMY  12/19/2018   Procedure: POLYPECTOMY;  Surgeon: Mauri Pole, MD;  Location: [facility];  Service: Endoscopy;;  . SHOULDER HEMI-ARTHROPLASTY  01/01/2012   Procedure: SHOULDER HEMI-ARTHROPLASTY;  Surgeon: Roseanne Kaufman, MD;  Location: [facility];  Service: Orthopedics;  Laterality: Left;  Left Shoulder Hemi Arthroplasty with Repair and Reconstruction as Necessary   . THYROIDECTOMY     73yrs ago. follows endocrine  . TONSILLECTOMY    . TOTAL KNEE ARTHROPLASTY Right 12/30/2014   Procedure: RIGHT TOTAL KNEE ARTHROPLASTY;  Surgeon: Gaynelle Arabian, MD;  Location: [facility];  Service: Orthopedics;  Laterality: Right;  . TOTAL KNEE ARTHROPLASTY Left 11/29/2016   Procedure: LEFT TOTAL KNEE ARTHROPLASTY;  Surgeon: Gaynelle Arabian, MD;  Location: [facility];  Service: Orthopedics;  Laterality: Left;    MEDS:   Current Outpatient Medications on File Prior to Visit  Medication Sig Dispense Refill  .  atenolol (TENORMIN) 25 MG tablet Take 1 tablet by mouth once daily 90 tablet 0  . Bacillus Coagulans-Inulin (ALIGN PREBIOTIC-PROBIOTIC PO) Take 1 capsule by mouth daily.     . budesonide-formoterol (SYMBICORT) 160-4.5 MCG/ACT inhaler Inhale 2 puffs into the lungs 2 (two) times daily. 1 Inhaler 3  . cetirizine (ZYRTEC) 10 MG tablet Take 10 mg by mouth daily.    . Cholecalciferol (VITAMIN D3) 125 MCG (5000 UT) TABS Take 5,000 Units by mouth daily.    . Coenzyme Q10 (COQ10) 200 MG CAPS Take 200 mg by mouth daily.    . colesevelam (WELCHOL) 625 MG tablet TAKE 1 TABLET BY MOUTH THREE TIMES DAILY 90 tablet 0  . dicyclomine (BENTYL) 10 MG capsule TAKE 2 CAPSULES BY MOUTH 4 TIMES DAILY AS NEEDED 120 capsule 0  . dorzolamide-timolol (COSOPT) 22.3-6.8 MG/ML ophthalmic solution Place  1 drop into both eyes 2 (two) times daily.     [name] ESTRING 2 MG vaginal ring INSERT 1 RING  INTO VAGINA EVERY 3 MONTHS 1 each 3  . fluconazole (DIFLUCAN) 200 MG tablet Take 200 mg by mouth daily.    [name] FLUoxetine (PROZAC) 40 MG capsule Take 1 capsule by mouth once daily 90 capsule 0  . glimepiride (AMARYL) 4 MG tablet Take 2 tablets (8 mg total) by mouth daily with breakfast. 180 tablet 3  . glucose blood (FREESTYLE LITE) test strip USE TO CHECK BLOOD SUGAR EVERY MORNING E11.9 100 each 3  . hydrocortisone 2.5 % cream Apply 1 application topically 2 (two) times daily as needed (skin irritation/rash).     [name] ketoconazole (NIZORAL) 2 % cream APPLY CREAM TOPICALLY TO AFFECTED AREA(S) ONCE DAILY    . latanoprost (XALATAN) 0.005 % ophthalmic solution Place 1 drop into both eyes at bedtime.    [name] lisinopril (ZESTRIL) 10 MG tablet Take 1 tablet by mouth once daily 90 tablet 0  . lovastatin (MEVACOR) 10 MG tablet Take 1 tablet (10 mg total) by mouth once a week. 13 tablet 3  . Methylcellulose, Laxative, (CITRUCEL PO) Take 3 tablets by mouth at bedtime.    . metroNIDAZOLE (METROGEL) 0.75 % gel     . Multiple Vitamin (MULTIVITAMIN WITH  MINERALS) TABS tablet Take 1 tablet by mouth daily. Centrum Silver    . NYSTATIN powder Apply 1 application topically daily.    . pramipexole (MIRAPEX) 0.5 MG tablet TAKE 1 TABLET BY MOUTH AT BEDTIME 90 tablet 0  . Respiratory Therapy Supplies (FLUTTER) DEVI Use as directed 1 each 0  . Selenium 200 MCG CAPS Take 200 mcg by mouth at bedtime.     . temazepam (RESTORIL) 15 MG capsule TAKE 1 CAPSULE BY MOUTH AT BEDTIME AS NEEDED FOR  SLEEP 90 capsule 5  . VICTOZA 18 MG/3ML SOPN INJECT 0.3ML (1.8MG ) INTO THE SKIN DAILY. APPOINTMENT NEEDED FOR FUTURE REFILLS 9 mL 0  . vitamin B-12 (CYANOCOBALAMIN) 1000 MCG tablet Take 1,000 mcg by mouth daily.     No current facility-administered medications on file prior to visit.    ALLERGIES: Nitrofurantoin, Levaquin [levofloxacin in d5w], Brimonidine, Cephalexin, Erythromycin ethylsuccinate, and Percocet [oxycodone-acetaminophen]  Family History  Problem Relation Age of Onset  . Arthritis Mother   . COPD Father   . Heart disease Father        MI 66 - states he died of lockjaw  . Hypertension Father   . Hyperlipidemia Father   . Pancreatic cancer Paternal Grandfather   . Colon cancer Neg Hx   . Esophageal cancer Neg Hx   . Rectal cancer Neg Hx   . Stomach cancer Neg Hx     SH: Married, non-smoker  Review of Systems  All other systems reviewed and are negative.   PHYSICAL EXAMINATION:    BP 118/68 (BP Location: Right Arm, Patient Position: Sitting, Cuff Size: Large)   Pulse 84   Temp 97.9 F (36.6 C) (Temporal)   Ht 6' (1.829 m)   Wt 255 lb (115.7 kg)   LMP 05/10/1992 Comment: partial  BMI 34.58 kg/m     General appearance: alert, cooperative and appears stated age Lymph:  no inguinal LAD noted  Pelvic: External genitalia:  no lesions              Urethra:  normal appearing urethra with no masses, tenderness or lesions  Bartholins and Skenes: normal                 Vagina: normal appearing vagina with normal color and  discharge, no lesions, Estring was removed without difficulty and replaced              Cervix: no lesions              Bimanual Exam:  Uterus:  normal size, contour, position, consistency, mobility, non-tender              Adnexa: no mass, fullness, tenderness  Chaperone, Terence Lux, CMA, was present for exam.  Assessment: Atrophic vaginitis Estring use  Plan: Patient has 2 more refills and does not need a new prescription today. Return in 3 to 4 months for removal and replacement

## 2019-06-22 LAB — CBC WITH DIFFERENTIAL/PLATELET

## 2019-06-25 ENCOUNTER — Ambulatory Visit: Payer: Medicare PPO | Attending: Internal Medicine

## 2019-06-25 DIAGNOSIS — Z23 Encounter for immunization: Secondary | ICD-10-CM | POA: Insufficient documentation

## 2019-06-25 NOTE — Progress Notes (Signed)
   Covid-19 Vaccination Clinic  Name:  [name]    MRN: [id number] DOB: 1940-07-25  06/25/2019  [name] was observed post Covid-19 immunization for 15 minutes without incidence. She was provided with Vaccine Information Sheet and instruction to access the V-Safe system.   [name] was instructed to call 911 with any severe reactions post vaccine: [name] Difficulty breathing  . Swelling of your face and throat  . A fast heartbeat  . A bad rash all over your body  . Dizziness and weakness    Immunizations Administered    Name Date Dose VIS Date Route   Pfizer COVID-19 Vaccine 06/25/2019 10:05 AM 0.3 mL 04/20/2019 Intramuscular   Manufacturer: [place]   Lot: [id number]   [place]: [id number]

## 2019-07-03 ENCOUNTER — Other Ambulatory Visit: Payer: Self-pay | Admitting: Gastroenterology

## 2019-07-04 ENCOUNTER — Ambulatory Visit: Payer: Medicare PPO | Admitting: Podiatry

## 2019-07-04 ENCOUNTER — Encounter: Payer: Self-pay | Admitting: Podiatry

## 2019-07-04 ENCOUNTER — Other Ambulatory Visit: Payer: Self-pay

## 2019-07-04 DIAGNOSIS — E1142 Type 2 diabetes mellitus with diabetic polyneuropathy: Secondary | ICD-10-CM

## 2019-07-04 DIAGNOSIS — M79674 Pain in right toe(s): Secondary | ICD-10-CM | POA: Diagnosis not present

## 2019-07-04 DIAGNOSIS — M79675 Pain in left toe(s): Secondary | ICD-10-CM | POA: Diagnosis not present

## 2019-07-04 DIAGNOSIS — B351 Tinea unguium: Secondary | ICD-10-CM | POA: Diagnosis not present

## 2019-07-04 NOTE — Progress Notes (Signed)
Complaint:  Visit Type: Patient returns to my office for continued preventative foot care services. Complaint: Patient states" my nails have grown long and thick and become painful to walk and wear shoes" Patient has been diagnosed with DM with neuropathy. co The patient presents for preventative foot care services.  Podiatric Exam: Vascular: dorsalis pedis and posterior tibial pulses are palpable bilateral. Capillary return is immediate. Temperature gradient is WNL. Skin turgor WNL  Sensorium: Normal Semmes Weinstein monofilament test. Normal tactile sensation bilaterally. Nail Exam: Pt has thick disfigured discolored nails with subungual debris noted bilateral entire nail hallux through fifth toenails Ulcer Exam: There is no evidence of ulcer or pre-ulcerative changes or infection. Orthopedic Exam: Muscle tone and strength are WNL. No limitations in general ROM. No crepitus or effusions noted. Foot type and digits show no abnormalities. HAV  B/L.  Hammer toes 2-5  B/L. Skin: No Porokeratosis. No infection or ulcers  Diagnosis:  Onychomycosis, , Pain in right toe, pain in left toes  Treatment & Plan Procedures and Treatment: Consent by patient was obtained for treatment procedures.   Debridement of mycotic and hypertrophic toenails, 1 through 5 bilateral and clearing of subungual debris. No ulceration, no infection noted.  Return Visit-Office Procedure: Patient instructed to return to the office for a follow up visit 4 months for continued evaluation and treatment.    Gardiner Barefoot DPM

## 2019-07-05 ENCOUNTER — Other Ambulatory Visit: Payer: Self-pay | Admitting: Gastroenterology

## 2019-07-05 NOTE — Telephone Encounter (Signed)
Patient requesting refill on colesevelam 625mg 

## 2019-07-06 MED ORDER — COLESEVELAM HCL 625 MG PO TABS: 625.0000 mg | ORAL_TABLET | Freq: Three times a day (TID) | ORAL | 0 refills | Status: DC

## 2019-07-06 NOTE — Telephone Encounter (Signed)
Prescription sent to patient's pharmacy and patient notified to make appt for further refills.

## 2019-07-06 NOTE — Addendum Note (Signed)
Addended by: Dorisann Frames L on: 07/06/2019 11:30 AM   Modules accepted: Orders

## 2019-07-06 NOTE — Telephone Encounter (Signed)
Pt called back to check on status of refill.

## 2019-07-09 ENCOUNTER — Other Ambulatory Visit (INDEPENDENT_AMBULATORY_CARE_PROVIDER_SITE_OTHER): Payer: Medicare PPO

## 2019-07-09 ENCOUNTER — Telehealth: Payer: Self-pay | Admitting: Obstetrics & Gynecology

## 2019-07-09 ENCOUNTER — Other Ambulatory Visit: Payer: Self-pay | Admitting: Obstetrics & Gynecology

## 2019-07-09 ENCOUNTER — Other Ambulatory Visit: Payer: Self-pay | Admitting: Gastroenterology

## 2019-07-09 DIAGNOSIS — R300 Dysuria: Secondary | ICD-10-CM

## 2019-07-09 LAB — POCT URINALYSIS DIPSTICK

## 2019-07-09 MED ORDER — SULFAMETHOXAZOLE-TRIMETHOPRIM 400-80 MG PO TABS: 1.0000 | ORAL_TABLET | Freq: Two times a day (BID) | ORAL | 0 refills | Status: DC

## 2019-07-09 NOTE — Telephone Encounter (Signed)
Spoke with patient. Patient reports dysuria and unable to control urine flow, symptoms started on 2/26. Started taking OTC AZO over the weekend, no relief. Denies fever/chills, N/V, flank pain, odor vaginal bleeding or d/c. Patient states she requires a wheelchair and would prefer not to come out in the rain, declines OV. Requesting RX or to bring in urine sample. Advised patient I will have to review with Dr. Sabra Heck and return call to advise. Pharmacy on file confirmed.   Patient was tx for Klebsiella UTI 04/03/19 with Bactrim DS.   Dr. Sabra Heck -please review and advise.

## 2019-07-09 NOTE — Telephone Encounter (Signed)
Pt's husband dropped off urine sample.  Urine dip not completed due to using AZO. Urine culture orders placed and drawn and gave to [place] in lab.   Routing to Dr Sabra Heck for review and will close encounter.

## 2019-07-09 NOTE — Addendum Note (Signed)
Addended by: Georgia Lopes on: 07/09/2019 02:24 PM   Modules accepted: Orders

## 2019-07-09 NOTE — Telephone Encounter (Signed)
Fine for husband to get up and bring in sample for her.  I'd like [name] to dip it and sent culture before just sending in antibiotics.  Thanks.

## 2019-07-09 NOTE — Telephone Encounter (Signed)
Patient calling in about inability to get out 9 uses wheelchair)  and requesting husband to drop off specimen.

## 2019-07-09 NOTE — Telephone Encounter (Signed)
Spoke with patient, advised per Dr. Sabra Heck. Advised patient I will place urine collection at front office for pick up. Patient states she will plan to return urine specimen today. Patient verbalizes understanding and is agreeable.   Routing to provider for final review. Patient is agreeable to disposition. Will close encounter.

## 2019-07-09 NOTE — Telephone Encounter (Signed)
Patient is calling regarding UTI. Patient stated that she would like to have her husband come and get a collection cup and bring it back to the office so that she does not have to leave the house.

## 2019-07-11 LAB — URINE CULTURE

## 2019-07-12 ENCOUNTER — Other Ambulatory Visit: Payer: Self-pay

## 2019-07-12 DIAGNOSIS — R300 Dysuria: Secondary | ICD-10-CM

## 2019-07-12 NOTE — Progress Notes (Signed)
Future orders placed per Dr Sabra Heck for repeat urine culture.  Encounter closed.

## 2019-07-14 ENCOUNTER — Other Ambulatory Visit: Payer: Self-pay | Admitting: Gastroenterology

## 2019-07-14 ENCOUNTER — Telehealth: Payer: Self-pay | Admitting: Gastroenterology

## 2019-07-14 NOTE — Telephone Encounter (Signed)
Pt with acute LLQ pain for several hours today. Feels exactly like prior episodes of diverticulitis. Offered ED evaluation or treatment with antibiotics with understanding the ED evaluation needed if she fails to rapidly improve. Requests treatment for a vaginal yeast infection that she anticipates with antibiotic use. Advised to call office on Monday for follow up. Light diet and rest at home this weekend.  Augmentin 875 mg po bid, #14 Diflucan 100 mg po qd x 3

## 2019-07-16 ENCOUNTER — Ambulatory Visit: Payer: Medicare PPO | Admitting: Obstetrics & Gynecology

## 2019-07-16 ENCOUNTER — Other Ambulatory Visit: Payer: Self-pay

## 2019-07-16 ENCOUNTER — Telehealth: Payer: Self-pay | Admitting: Obstetrics & Gynecology

## 2019-07-16 ENCOUNTER — Encounter: Payer: Self-pay | Admitting: Obstetrics & Gynecology

## 2019-07-16 VITALS — BP 110/58 | HR 84 | Temp 97.7°F | Resp 12 | Ht 72.0 in | Wt 253.0 lb

## 2019-07-16 DIAGNOSIS — R3129 Other microscopic hematuria: Secondary | ICD-10-CM | POA: Diagnosis not present

## 2019-07-16 DIAGNOSIS — R102 Pelvic and perineal pain: Secondary | ICD-10-CM | POA: Diagnosis not present

## 2019-07-16 DIAGNOSIS — R1032 Left lower quadrant pain: Secondary | ICD-10-CM | POA: Diagnosis not present

## 2019-07-16 LAB — POCT URINALYSIS DIPSTICK
Bilirubin, UA: NEGATIVE
Glucose, UA: POSITIVE — AB
Ketones, UA: NEGATIVE
Leukocytes, UA: NEGATIVE
Nitrite, UA: NEGATIVE
Protein, UA: NEGATIVE
Urobilinogen, UA: 0.2 E.U./dL
pH, UA: 5 (ref 5.0–8.0)

## 2019-07-16 NOTE — Telephone Encounter (Signed)
Spoke with patient. Patient tx with Bactrim DS for E coli UTI on 3/1, has completed tx and scheduled for rpt UC. Patient reports pain in the vagina that started on 3/7, increasing in intensity, describes as "spasms". Denies urinary symptoms, fever/chills, N/V, bleeding, vaginal d/c. Reports odor, states she always has odor.   [id number] prescreen negative, precautions reviewed. Patient requires wheelchair.   Reviewed with Dr. Sabra Heck, OV needed for further evaluation, patient agreeable to OV. OV scheduled for today at 12pm.   Encounter closed.

## 2019-07-16 NOTE — Telephone Encounter (Signed)
Patient is calling stating that she is experiencing vaginal pain. Patient stated that is it right inside the pain. Patient stated that it is a dull pain, but has progressively gotten worse since it began yesterday.

## 2019-07-16 NOTE — Progress Notes (Signed)
GYNECOLOGY  VISIT  CC:   Patient complains of having "spasmodic" pain in the vaginal area. Per patient, thinks that she has a kidney stone that is in the urethra.   HPI: 79 y.o. G0P0 Married White or Caucasian female here for pelvic pain that feels close to the vaginal area.  She does not feel like it is worse when bladder fills or when she voids.  States "this feels like a kidney stone in my urethra" but she's never had something like that before.  She does have hx of kidney stones but had obstructing right UPJ stone.  Pain isn't the same as that was on that occasional where it was a severe RLQ pain.  She had a CT due to pain showing the stone.  CT reviewed.  This was in 2015.    Denies vaginal bleeding or visible blood in urine.  Denies back pain.  Did self treat with monistat yesterday as she thought maybe a yeast vaginitis was causing the pain.    Upon asking about diarrhea or constipation, pt reports she did have diarrhea   Did have diarrhea yesterday and Saturday with associated LLQ pain.  She had eaten some popcorn and has a hx of diverticulosis/diverticulitis.  She called [facility] and spoke with Dr. Fuller Plan.  Augmentin 875mg  bid x 7 days was prescribed.  She was to call for follow up today.  Because she's felt this was vaginal or urinary, she called here instead.    Pt is not having any worse pain with urination.  She denies dysuria.  She does have microscopic hematuria on dip u/a today.  Did have e coli UTI with 07/09/2019 urine culture that was sensitive to all antibiotics tested.  She was treated for uncomplicated UTI with bactrim DS BID x 3 days.    Denies fever, nausea/vomiting.    GYNECOLOGIC HISTORY: Patient's last menstrual period was 05/10/1992. Contraception: Hysterectomy Menopausal hormone therapy: Estring  Patient Active Problem List   Diagnosis Date Noted  . Healthcare maintenance 02/22/2019  . Physical deconditioning 02/22/2019  . Shortness of breath 02/22/2019  .  Adenomatous polyp 12/20/2018  . Pain due to onychomycosis of toenails of both feet 11/08/2018  . Diabetic neuropathy ([place]) 11/08/2018  . History of total knee replacement, bilateral 11/14/2017  . Personal history of colonic polyps 05/18/2017  . Arthritis of midfoot 03/03/2016  . Hammertoes of both feet 03/03/2016  . Aortic atherosclerosis ([place]) 02/16/2016  . Solitary pulmonary nodule 01/08/2016  . 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  . Bronchiectasis ([place]) 10/18/2015  . IBS (irritable bowel syndrome) 07/17/2014  . Recurrent major depression in partial remission ([place]) 01/03/2014  . Posterior tibial tendon dysfunction 01/03/2014  . Osteoarthritis, knee 01/03/2014  . Glaucoma 01/03/2014  . Hypertension associated with diabetes ([place]) 01/03/2014  . Obesity (BMI 30-39.9) 06/15/2013  . Primary open-angle glaucoma 08/02/2012  . Foot tendinitis 07/20/2010  . INSOMNIA, CHRONIC 07/25/2009  . Fatty liver 03/18/2009  . GERD 12/25/2007  . Hyperlipidemia associated with type 2 diabetes mellitus ([place]) 07/26/2007  . ANEMIA, B12 DEFICIENCY 12/29/2006  . Diabetes mellitus type II, controlled ([place]) 12/28/2006  . RESTLESS LEG SYNDROME, MILD 12/28/2006  . NEUROPATHY, IDIOPATHIC PERIPHERAL NEC 12/28/2006  . Osteoporosis 12/12/2006  . BREAST CANCER, HX OF 12/12/2006    Past Medical History:  Diagnosis Date  . Allergy   . Anemia   . Anxiety   . Arthritis    right knee;injections every 8months   . Asthma   . Bronchiectasis ([place])   . Cancer (  [place]) 1994/1995   HX BREAST CANCER/ right brreast and left breast in 1995  . COMPRESSION FRACTURE, LUMBAR VERTEBRAE 08/21/2008   Qualifier: Diagnosis of  By: Arnoldo Morale MD, Balinda Quails   . Depression   . Diabetes mellitus    takes Amaryl and Januvia daily  . Difficulty sleeping   . Diverticulitis   . Early cataracts, bilateral   . Eczema   . Endometriosis   . Gastritis   . Glaucoma   . Hammer toe   . Headache(784.0)    Migraines  . Hyperlipidemia associated with type  2 diabetes mellitus ([place]) 07/26/2007   Diet/exercise control.    . Hypertension    takes Atenolol daily  . IBS (irritable bowel syndrome)   . Joint pain   . Kidney stone   . Neuropathy   . Open wound of second toe of left foot in past   at pre-op appt, wound appears clear of infection 1 month post removal of toenail, no obvious exudate present nor any redness,  . Osteomyelitis ([place])    left 2nd toe  . Osteopenia   . PONV (postoperative nausea and vomiting)   . Posterior tibial tendon dysfunction    left foot  . Scoliosis   . Severe esophageal dysplasia   . 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 Thyroid disease   . Vitamin D deficiency    takes Vit d daily  . Weakness    uses a walker and wheelchair    Past Surgical History:  Procedure Laterality Date  . ADENOIDECTOMY     at age 87  . BIOPSY  12/19/2018   Procedure: BIOPSY;  Surgeon: Mauri Pole, MD;  Location: [facility];  Service: Endoscopy;;  . CATARACT EXTRACTION    . CHOLECYSTECTOMY  06/2008  . COLONOSCOPY WITH PROPOFOL N/A 05/17/2017   Procedure: COLONOSCOPY WITH PROPOFOL;  Surgeon: Mauri Pole, MD;  Location: [facility];  Service: Endoscopy;  Laterality: N/A;  PT WILL BE ADMITTED THE DAY BEFORE FOR PREP PER ROBIN KB  . COLONOSCOPY WITH PROPOFOL N/A 12/19/2018   Procedure: COLONOSCOPY WITH PROPOFOL;  Surgeon: Mauri Pole, MD;  Location: [facility];  Service: Endoscopy;  Laterality: N/A;  . DILATION AND CURETTAGE OF UTERUS    . excision on breast     internal infected suture from breast surgery  . excision removed from neck     infected lymph node  . FOOT SURGERY     left foot-cleaning out areas on toes at the wound center-having MRI to determine if osteomyelitis  . HYSTEROSCOPY  06/22/11   PMB submucosal myoma  . JOINT REPLACEMENT     left total shoulder  . LAPAROSCOPIC OOPHORECTOMY Right 12/2004   absent LSO  . LAPAROTOMY    . MASTECTOMY Bilateral  1994 right, 1995 left  . POLYPECTOMY  12/19/2018   Procedure: POLYPECTOMY;  Surgeon: Mauri Pole, MD;  Location: [facility];  Service: Endoscopy;;  . SHOULDER HEMI-ARTHROPLASTY  01/01/2012   Procedure: SHOULDER HEMI-ARTHROPLASTY;  Surgeon: Roseanne Kaufman, MD;  Location: [facility];  Service: Orthopedics;  Laterality: Left;  Left Shoulder Hemi Arthroplasty with Repair and Reconstruction as Necessary   . THYROIDECTOMY     37yrs ago. follows endocrine  . TONSILLECTOMY    . TOTAL KNEE ARTHROPLASTY Right 12/30/2014   Procedure: RIGHT TOTAL KNEE ARTHROPLASTY;  Surgeon: Gaynelle Arabian, MD;  Location: [facility];  Service: Orthopedics;  Laterality: Right;  . TOTAL KNEE ARTHROPLASTY  Left 11/29/2016   Procedure: LEFT TOTAL KNEE ARTHROPLASTY;  Surgeon: Gaynelle Arabian, MD;  Location: [facility];  Service: Orthopedics;  Laterality: Left;    MEDS:   Current Outpatient Medications on File Prior to Visit  Medication Sig Dispense Refill  . atenolol (TENORMIN) 25 MG tablet Take 1 tablet by mouth once daily 90 tablet 0  . Bacillus Coagulans-Inulin (ALIGN PREBIOTIC-PROBIOTIC PO) Take 1 capsule by mouth daily.     . budesonide-formoterol (SYMBICORT) 160-4.5 MCG/ACT inhaler Inhale 2 puffs into the lungs 2 (two) times daily. 1 Inhaler 3  . cetirizine (ZYRTEC) 10 MG tablet Take 10 mg by mouth daily.    . Cholecalciferol (VITAMIN D3) 125 MCG (5000 UT) TABS Take 5,000 Units by mouth daily.    . Coenzyme Q10 (COQ10) 200 MG CAPS Take 200 mg by mouth daily.    . colesevelam (WELCHOL) 625 MG tablet Take 1 tablet (625 mg total) by mouth 3 (three) times daily. NEEDS APPT FOR FURTHER REFILLS 90 tablet 0  . dicyclomine (BENTYL) 10 MG capsule TAKE 2 CAPSULES BY MOUTH 4 TIMES DAILY AS NEEDED 120 capsule 0  . dorzolamide-timolol (COSOPT) 22.3-6.8 MG/ML ophthalmic solution Place 1 drop into both eyes 2 (two) times daily.     [name] ESTRING 2 MG vaginal ring INSERT 1 RING  INTO VAGINA EVERY 3 MONTHS 1 each 3  . fluconazole (DIFLUCAN) 150 MG  tablet Take 150 mg by mouth once.    [name] FLUoxetine (PROZAC) 40 MG capsule Take 1 capsule by mouth once daily 90 capsule 0  . glimepiride (AMARYL) 4 MG tablet Take 2 tablets (8 mg total) by mouth daily with breakfast. 180 tablet 3  . glucose blood (FREESTYLE LITE) test strip USE TO CHECK BLOOD SUGAR EVERY MORNING E11.9 100 each 3  . hydrocortisone 2.5 % cream Apply 1 application topically 2 (two) times daily as needed (skin irritation/rash).     [name] ketoconazole (NIZORAL) 2 % cream APPLY CREAM TOPICALLY TO AFFECTED AREA(S) ONCE DAILY    . latanoprost (XALATAN) 0.005 % ophthalmic solution Place 1 drop into both eyes at bedtime.    [name] lisinopril (ZESTRIL) 10 MG tablet Take 1 tablet by mouth once daily 90 tablet 0  . lovastatin (MEVACOR) 10 MG tablet Take 1 tablet (10 mg total) by mouth once a week. 13 tablet 3  . Methylcellulose, Laxative, (CITRUCEL PO) Take 3 tablets by mouth at bedtime.    . metroNIDAZOLE (METROGEL) 0.75 % gel     . Multiple Vitamin (MULTIVITAMIN WITH MINERALS) TABS tablet Take 1 tablet by mouth daily. Centrum Silver    . NYSTATIN powder Apply 1 application topically daily.    . pramipexole (MIRAPEX) 0.5 MG tablet TAKE 1 TABLET BY MOUTH AT BEDTIME 90 tablet 0  . Respiratory Therapy Supplies (FLUTTER) DEVI Use as directed 1 each 0  . Selenium 200 MCG CAPS Take 200 mcg by mouth at bedtime.     . temazepam (RESTORIL) 15 MG capsule TAKE 1 CAPSULE BY MOUTH AT BEDTIME AS NEEDED FOR  SLEEP 90 capsule 5  . VICTOZA 18 MG/3ML SOPN INJECT 0.3ML (1.8MG ) INTO THE SKIN DAILY. APPOINTMENT NEEDED FOR FUTURE REFILLS 9 mL 0  . vitamin B-12 (CYANOCOBALAMIN) 1000 MCG tablet Take 1,000 mcg by mouth daily.     No current facility-administered medications on file prior to visit.    ALLERGIES: Nitrofurantoin, Levaquin [levofloxacin in d5w], Brimonidine, Cephalexin, Erythromycin ethylsuccinate, and Percocet [oxycodone-acetaminophen]  Family History  Problem Relation Age of Onset  . Arthritis Mother    .  COPD Father   . Heart disease Father        MI 54 - states he died of lockjaw  . Hypertension Father   . Hyperlipidemia Father   . Pancreatic cancer Paternal Grandfather   . Colon cancer Neg Hx   . Esophageal cancer Neg Hx   . Rectal cancer Neg Hx   . Stomach cancer Neg Hx     SH:  Married, non smoker  Review of Systems  Genitourinary:       Pain in vagina  All other systems reviewed and are negative.   PHYSICAL EXAMINATION:    BP (!) 110/58 (BP Location: Right Arm, Patient Position: Sitting, Cuff Size: Large)   Pulse 84   Temp 97.7 F (36.5 C) (Temporal)   Resp 12   Ht 6' (1.829 m)   Wt 253 lb (114.8 kg)   LMP 05/10/1992 Comment: partial  BMI 34.31 kg/m     General appearance: alert, cooperative and appears stated age CV:  Regular rate and rhythm Lungs:  clear to auscultation, no wheezes, rales or rhonchi, symmetric air entry Abdomen: LLQ tenderness noted on palpation, no mass, hernia or organomegaly noted, no rebound or guarding, normal BS noted Lymph:  no inguinal LAD noted  Pelvic: External genitalia:  no lesions              Urethra:  normal appearing urethra with no masses, tenderness or lesions              Bartholins and Skenes: normal                 Vagina: normal appearing vagina with normal color and discharge, no lesions, no urethral tenderness, white cream in vaginal (c/w pt using OTC monistat yesterday)              Cervix: no lesions              Bimanual Exam:  Uterus: uterus normal, no masses, no CMT, no bladder tenderness on BME today              Adnexa: no mass, fullness, tenderness              Rectovaginal: Yes.  .  Confirms.              Anus:  normal sphincter tone, no lesions  Chaperone, Terence Lux, CMA, was present for exam.  Assessment: Pelvic, LLQ pain H/o UTI last week that was treated appropriately Microscopic hematuria S/p laparoscopic ROS 2006 H/o diverticulosis/diverticulitis H/o renal stones Diabetes, on Victoza    Plan: Pt's exam is not clear as cause of pain but it is most notable on exam in the LLQ so feel diverticulitis is more likely cause.  She was advised to start taking the augmentin 875mg  bid as prescribed by Dr. Fuller Plan.  Urine culture e. Coli was sensitive to this so will cover both.   Repeat urine culture sent today CBC with diff obtained today Pt very strongly recommended to seek ER care if pain worsens and highly encouraged her to stop trying to self diagnosis and seek evaluation as she was treated over the phone last week and over the weekend by two separate providers.

## 2019-07-17 LAB — CBC WITH DIFFERENTIAL/PLATELET
Basophils Absolute: 0.1 10*3/uL (ref 0.0–0.2)
Basos: 1 %
EOS (ABSOLUTE): 0.5 10*3/uL — ABNORMAL HIGH (ref 0.0–0.4)
Eos: 4 %
Hematocrit: 44.2 % (ref 34.0–46.6)
Hemoglobin: 14.4 g/dL (ref 11.1–15.9)
Immature Grans (Abs): 0.1 10*3/uL (ref 0.0–0.1)
Immature Granulocytes: 1 %
Lymphocytes Absolute: 2.3 10*3/uL (ref 0.7–3.1)
Lymphs: 19 %
MCH: 31 pg (ref 26.6–33.0)
MCHC: 32.6 g/dL (ref 31.5–35.7)
MCV: 95 fL (ref 79–97)
Monocytes Absolute: 1 10*3/uL — ABNORMAL HIGH (ref 0.1–0.9)
Monocytes: 8 %
Neutrophils Absolute: 8.5 10*3/uL — ABNORMAL HIGH (ref 1.4–7.0)
Neutrophils: 67 %
Platelets: 306 10*3/uL (ref 150–450)
RBC: 4.64 x10E6/uL (ref 3.77–5.28)
RDW: 13.8 % (ref 11.7–15.4)
WBC: 12.4 10*3/uL — ABNORMAL HIGH (ref 3.4–10.8)

## 2019-07-18 ENCOUNTER — Other Ambulatory Visit: Payer: Self-pay

## 2019-07-18 ENCOUNTER — Telehealth: Payer: Self-pay | Admitting: Obstetrics & Gynecology

## 2019-07-18 ENCOUNTER — Emergency Department (HOSPITAL_COMMUNITY)
Admission: EM | Admit: 2019-07-18 | Discharge: 2019-07-19 | Disposition: A | Discharge status: Home / Self Care | Payer: Medicare PPO | Attending: Emergency Medicine | Admitting: Emergency Medicine

## 2019-07-18 ENCOUNTER — Encounter (HOSPITAL_COMMUNITY): Payer: Self-pay | Admitting: Emergency Medicine

## 2019-07-18 ENCOUNTER — Emergency Department (HOSPITAL_COMMUNITY): Payer: Medicare PPO

## 2019-07-18 DIAGNOSIS — Z853 Personal history of malignant neoplasm of breast: Secondary | ICD-10-CM | POA: Diagnosis not present

## 2019-07-18 DIAGNOSIS — E079 Disorder of thyroid, unspecified: Secondary | ICD-10-CM | POA: Diagnosis not present

## 2019-07-18 DIAGNOSIS — E119 Type 2 diabetes mellitus without complications: Secondary | ICD-10-CM | POA: Insufficient documentation

## 2019-07-18 DIAGNOSIS — I10 Essential (primary) hypertension: Secondary | ICD-10-CM | POA: Insufficient documentation

## 2019-07-18 DIAGNOSIS — R1032 Left lower quadrant pain: Secondary | ICD-10-CM | POA: Insufficient documentation

## 2019-07-18 DIAGNOSIS — Z79899 Other long term (current) drug therapy: Secondary | ICD-10-CM | POA: Insufficient documentation

## 2019-07-18 LAB — CBC
HCT: 42 % (ref 36.0–46.0)
Hemoglobin: 13.4 g/dL (ref 12.0–15.0)
MCH: 31.8 pg (ref 26.0–34.0)
MCHC: 31.9 g/dL (ref 30.0–36.0)
MCV: 99.5 fL (ref 80.0–100.0)
Platelets: 281 10*3/uL (ref 150–400)
RBC: 4.22 MIL/uL (ref 3.87–5.11)
RDW: 14.4 % (ref 11.5–15.5)
WBC: 8.8 10*3/uL (ref 4.0–10.5)
nRBC: 0 % (ref 0.0–0.2)

## 2019-07-18 LAB — URINALYSIS, ROUTINE W REFLEX MICROSCOPIC
Bilirubin Urine: NEGATIVE
Glucose, UA: NEGATIVE mg/dL
Hgb urine dipstick: NEGATIVE
Ketones, ur: 5 mg/dL — AB
Nitrite: NEGATIVE
Protein, ur: 100 mg/dL — AB
Specific Gravity, Urine: 1.021 (ref 1.005–1.030)
pH: 5 (ref 5.0–8.0)

## 2019-07-18 LAB — COMPREHENSIVE METABOLIC PANEL
ALT: 31 U/L (ref 0–44)
AST: 30 U/L (ref 15–41)
Albumin: 3.4 g/dL — ABNORMAL LOW (ref 3.5–5.0)
Alkaline Phosphatase: 78 U/L (ref 38–126)
Anion gap: 12 (ref 5–15)
BUN: 28 mg/dL — ABNORMAL HIGH (ref 8–23)
CO2: 25 mmol/L (ref 22–32)
Calcium: 9.2 mg/dL (ref 8.9–10.3)
Chloride: 99 mmol/L (ref 98–111)
Creatinine, Ser: 0.94 mg/dL (ref 0.44–1.00)
GFR calc Af Amer: >60 mL/min (ref >60)
GFR calc non Af Amer: 58 mL/min — ABNORMAL LOW (ref >60)
Glucose, Bld: 210 mg/dL — ABNORMAL HIGH (ref 70–99)
Potassium: 4.8 mmol/L (ref 3.5–5.1)
Sodium: 136 mmol/L (ref 135–145)
Total Bilirubin: 0.5 mg/dL (ref 0.3–1.2)
Total Protein: 6.5 g/dL (ref 6.5–8.1)

## 2019-07-18 LAB — LIPASE, BLOOD: Lipase: 34 U/L (ref 11–51)

## 2019-07-18 LAB — URINE CULTURE

## 2019-07-18 MED ORDER — IOHEXOL 300 MG/ML  SOLN
100.0000 mL | Freq: Once | INTRAMUSCULAR | Status: AC | PRN
  Administered 2019-07-18: 100 mL via INTRAVENOUS

## 2019-07-18 MED ORDER — SODIUM CHLORIDE 0.9% FLUSH: 3.0000 mL | Freq: Once | INTRAVENOUS | Status: DC

## 2019-07-18 MED ORDER — SODIUM CHLORIDE 0.9 % IV BOLUS
1000.0000 mL | Freq: Once | INTRAVENOUS | Status: AC
  Administered 2019-07-18: 1000 mL via INTRAVENOUS

## 2019-07-18 NOTE — ED Provider Notes (Signed)
[facility] EMERGENCY DEPARTMENT Provider Note   CSN: [id number] Arrival date & time: 07/18/19  1645     History Chief Complaint  Patient presents with  . Abdominal Pain    [name] is a 79 y.o. female History of Diverticulitis, hypertension, hyperlipidemia who presents for evaluation of persistent left lower quadrant abdominal pain.  She reports that about a week ago, she was treated for a UTI with antibiotics.  She states she started to feel better but then on 07/14/2019 she started developing some left lower quadrant abdominal pain.  She thought initially that this was because she ate some popcorn and thought she exacerbated her diverticulitis.  She called her GI doctor for her symptoms and he prescribed her Augmentin which she had been taking.  Patient reports that she continued to have pain and then would have a different "hard ache" type sensation in her left vaginal/groin area.  She went to her OB/GYN doctor who did a physical and noted normal GU exam.  They encouraged her to continue taking her antibiotics.  Patient reports that she also took leftover pain medication that she had had but still was having pain in left lower quadrant that would occasionally shoot down into the left groin area.  She stated she had not had any dysuria or hematuria.  She states that she called her OB/GYN to tell her that she was still having pain who recommended that she go to the emergency department for further evaluation.  She reports her last dose of pain medication was about 6 AM this morning.  She states that the pain is improved slightly though she still feels it.  She has had some nausea but denies any vomiting.  She has chronic diarrhea which she states is normal for her.  No blood noted in stools.  She denies any fevers, chest pain, difficulty breathing, vaginal bleeding, vaginal discharge.   The history is provided by the patient.       Past Medical History:  Diagnosis  Date  . Allergy   . Anemia   . Anxiety   . Arthritis    right knee;injections every 66months   . Asthma   . Bronchiectasis ([place])   . Cancer ([place]) 1994/1995   HX BREAST CANCER/ right brreast and left breast in 1995  . COMPRESSION FRACTURE, LUMBAR VERTEBRAE 08/21/2008   Qualifier: Diagnosis of  By: Arnoldo Morale MD, Balinda Quails   . Depression   . Diabetes mellitus    takes Amaryl and Januvia daily  . Difficulty sleeping   . Diverticulitis   . Early cataracts, bilateral   . Eczema   . Endometriosis   . Gastritis   . Glaucoma   . Hammer toe   . Headache(784.0)    Migraines  . Hyperlipidemia associated with type 2 diabetes mellitus ([place]) 07/26/2007   Diet/exercise control.    . Hypertension    takes Atenolol daily  . IBS (irritable bowel syndrome)   . Joint pain   . Kidney stone   . Neuropathy   . Open wound of second toe of left foot in past   at pre-op appt, wound appears clear of infection 1 month post removal of toenail, no obvious exudate present nor any redness,  . Osteomyelitis ([place])    left 2nd toe  . Osteopenia   . PONV (postoperative nausea and vomiting)   . Posterior tibial tendon dysfunction    left foot  . Scoliosis   . Severe esophageal dysplasia   .  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 Thyroid disease   . Vitamin D deficiency    takes Vit d daily  . Weakness    uses a walker and wheelchair    Patient Active Problem List   Diagnosis Date Noted  . Healthcare maintenance 02/22/2019  . Physical deconditioning 02/22/2019  . Shortness of breath 02/22/2019  . Adenomatous polyp 12/20/2018  . Pain due to onychomycosis of toenails of both feet 11/08/2018  . Diabetic neuropathy ([place]) 11/08/2018  . History of total knee replacement, bilateral 11/14/2017  . Personal history of colonic polyps 05/18/2017  . Arthritis of midfoot 03/03/2016  . Hammertoes of both feet 03/03/2016  . Aortic atherosclerosis ([place]) 02/16/2016  . Solitary  pulmonary nodule 01/08/2016  . 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  . Bronchiectasis ([place]) 10/18/2015  . IBS (irritable bowel syndrome) 07/17/2014  . Recurrent major depression in partial remission ([place]) 01/03/2014  . Posterior tibial tendon dysfunction 01/03/2014  . Osteoarthritis, knee 01/03/2014  . Glaucoma 01/03/2014  . Hypertension associated with diabetes ([place]) 01/03/2014  . Obesity (BMI 30-39.9) 06/15/2013  . Primary open-angle glaucoma 08/02/2012  . Foot tendinitis 07/20/2010  . INSOMNIA, CHRONIC 07/25/2009  . Fatty liver 03/18/2009  . GERD 12/25/2007  . Hyperlipidemia associated with type 2 diabetes mellitus ([place]) 07/26/2007  . ANEMIA, B12 DEFICIENCY 12/29/2006  . Diabetes mellitus type II, controlled ([place]) 12/28/2006  . RESTLESS LEG SYNDROME, MILD 12/28/2006  . NEUROPATHY, IDIOPATHIC PERIPHERAL NEC 12/28/2006  . Osteoporosis 12/12/2006  . BREAST CANCER, HX OF 12/12/2006    Past Surgical History:  Procedure Laterality Date  . ADENOIDECTOMY     at age 50  . BIOPSY  12/19/2018   Procedure: BIOPSY;  Surgeon: Mauri Pole, MD;  Location: [facility];  Service: Endoscopy;;  . CATARACT EXTRACTION    . CHOLECYSTECTOMY  06/2008  . COLONOSCOPY WITH PROPOFOL N/A 05/17/2017   Procedure: COLONOSCOPY WITH PROPOFOL;  Surgeon: Mauri Pole, MD;  Location: [facility];  Service: Endoscopy;  Laterality: N/A;  PT WILL BE ADMITTED THE DAY BEFORE FOR PREP PER ROBIN KB  . COLONOSCOPY WITH PROPOFOL N/A 12/19/2018   Procedure: COLONOSCOPY WITH PROPOFOL;  Surgeon: Mauri Pole, MD;  Location: [facility];  Service: Endoscopy;  Laterality: N/A;  . DILATION AND CURETTAGE OF UTERUS    . excision on breast     internal infected suture from breast surgery  . excision removed from neck     infected lymph node  . FOOT SURGERY     left foot-cleaning out areas on toes at the wound center-having MRI to determine if osteomyelitis  . HYSTEROSCOPY  06/22/11   PMB submucosal myoma  . JOINT  REPLACEMENT     left total shoulder  . LAPAROSCOPIC OOPHORECTOMY Right 12/2004   absent LSO  . LAPAROTOMY    . MASTECTOMY Bilateral 1994 right, 1995 left  . POLYPECTOMY  12/19/2018   Procedure: POLYPECTOMY;  Surgeon: Mauri Pole, MD;  Location: [facility];  Service: Endoscopy;;  . SHOULDER HEMI-ARTHROPLASTY  01/01/2012   Procedure: SHOULDER HEMI-ARTHROPLASTY;  Surgeon: Roseanne Kaufman, MD;  Location: [facility];  Service: Orthopedics;  Laterality: Left;  Left Shoulder Hemi Arthroplasty with Repair and Reconstruction as Necessary   . THYROIDECTOMY     55yrs ago. follows endocrine  . TONSILLECTOMY    . TOTAL KNEE ARTHROPLASTY Right 12/30/2014   Procedure: RIGHT TOTAL KNEE ARTHROPLASTY;  Surgeon: Gaynelle Arabian, MD;  Location: [facility];  Service: Orthopedics;  Laterality:  Right;  [name] TOTAL KNEE ARTHROPLASTY Left 11/29/2016   Procedure: LEFT TOTAL KNEE ARTHROPLASTY;  Surgeon: Gaynelle Arabian, MD;  Location: [facility];  Service: Orthopedics;  Laterality: Left;     OB History    Gravida  0   Para      Term      Preterm      AB      Living        SAB      TAB      Ectopic      Multiple      Live Births           Obstetric Comments  1 adopted        Family History  Problem Relation Age of Onset  . Arthritis Mother   . COPD Father   . Heart disease Father        MI 67 - states he died of lockjaw  . Hypertension Father   . Hyperlipidemia Father   . Pancreatic cancer Paternal Grandfather   . Colon cancer Neg Hx   . Esophageal cancer Neg Hx   . Rectal cancer Neg Hx   . Stomach cancer Neg Hx     Social History   Tobacco Use  . Smoking status: Never Smoker  . Smokeless tobacco: Never Used  Substance Use Topics  . Alcohol use: No  . Drug use: No    Home Medications Prior to Admission medications   Medication Sig Start Date End Date Taking? Authorizing Provider  atenolol (TENORMIN) 25 MG tablet Take 1 tablet by mouth once daily 05/02/19   Marin Olp, MD    Bacillus Coagulans-Inulin (ALIGN PREBIOTIC-PROBIOTIC PO) Take 1 capsule by mouth daily.     [provider]  budesonide-formoterol (SYMBICORT) 160-4.5 MCG/ACT inhaler Inhale 2 puffs into the lungs 2 (two) times daily. 03/21/19   Lauraine Rinne, NP  cetirizine (ZYRTEC) 10 MG tablet Take 10 mg by mouth daily.    [provider]  Cholecalciferol (VITAMIN D3) 125 MCG (5000 UT) TABS Take 5,000 Units by mouth daily.    [provider]  Coenzyme Q10 (COQ10) 200 MG CAPS Take 200 mg by mouth daily.    [provider]  colesevelam (WELCHOL) 625 MG tablet Take 1 tablet (625 mg total) by mouth 3 (three) times daily. NEEDS APPT FOR FURTHER REFILLS 07/06/19   Mauri Pole, MD  dicyclomine (BENTYL) 10 MG capsule TAKE 2 CAPSULES BY MOUTH 4 TIMES DAILY AS NEEDED 07/09/19   Nandigam, Venia Minks, MD  dorzolamide-timolol (COSOPT) 22.3-6.8 MG/ML ophthalmic solution Place 1 drop into both eyes 2 (two) times daily.  02/08/13   [provider]  ESTRING 2 MG vaginal ring INSERT 1 RING  INTO VAGINA EVERY 3 MONTHS 11/21/18   Megan Salon, MD  fluconazole (DIFLUCAN) 150 MG tablet Take 150 mg by mouth once. 04/09/19   [provider]  FLUoxetine (PROZAC) 40 MG capsule Take 1 capsule by mouth once daily 05/14/19   Marin Olp, MD  glimepiride (AMARYL) 4 MG tablet Take 2 tablets (8 mg total) by mouth daily with breakfast. 03/22/19   Marin Olp, MD  glucose blood (FREESTYLE LITE) test strip USE TO CHECK BLOOD SUGAR EVERY MORNING E11.9 05/17/18   Marin Olp, MD  hydrocortisone 2.5 % cream Apply 1 application topically 2 (two) times daily as needed (skin irritation/rash).  08/10/18   [provider]  ketoconazole (NIZORAL) 2 % cream APPLY CREAM TOPICALLY  TO AFFECTED AREA(S) ONCE DAILY 04/27/19   [provider]  latanoprost (XALATAN) 0.005 % ophthalmic solution Place 1 drop into both eyes at bedtime. 05/20/18   [provider]   lisinopril (ZESTRIL) 10 MG tablet Take 1 tablet by mouth once daily 05/23/19   Marin Olp, MD  lovastatin (MEVACOR) 10 MG tablet Take 1 tablet (10 mg total) by mouth once a week. 06/18/19   Marin Olp, MD  Methylcellulose, Laxative, (CITRUCEL PO) Take 3 tablets by mouth at bedtime.    [provider]  metroNIDAZOLE (METROGEL) 0.75 % gel  05/28/19   [provider]  Multiple Vitamin (MULTIVITAMIN WITH MINERALS) TABS tablet Take 1 tablet by mouth daily. Centrum Silver    [provider]  NYSTATIN powder Apply 1 application topically daily. 05/01/19   [provider]  pramipexole (MIRAPEX) 0.5 MG tablet TAKE 1 TABLET BY MOUTH AT BEDTIME 05/03/19   Marin Olp, MD  Respiratory Therapy Supplies (FLUTTER) DEVI Use as directed 01/08/16   Juanito Doom, MD  Selenium 200 MCG CAPS Take 200 mcg by mouth at bedtime.     [provider]  temazepam (RESTORIL) 15 MG capsule TAKE 1 CAPSULE BY MOUTH AT BEDTIME AS NEEDED FOR  SLEEP 06/18/19   Marin Olp, MD  VICTOZA 18 MG/3ML SOPN INJECT 0.3ML (1.8MG ) INTO THE SKIN DAILY. APPOINTMENT NEEDED FOR FUTURE REFILLS 06/14/19   Marin Olp, MD  vitamin B-12 (CYANOCOBALAMIN) 1000 MCG tablet Take 1,000 mcg by mouth daily.    [provider]    Allergies    Nitrofurantoin, Levaquin [levofloxacin in d5w], Brimonidine, Cephalexin, Erythromycin ethylsuccinate, and Percocet [oxycodone-acetaminophen]  Review of Systems   Review of Systems  Constitutional: Negative for fever.  Respiratory: Negative for cough and shortness of breath.   Cardiovascular: Negative for chest pain.  Gastrointestinal: Positive for abdominal pain and nausea. Negative for vomiting.  Genitourinary: Negative for dysuria and hematuria.  Neurological: Negative for headaches.  All other systems reviewed and are negative.   Physical Exam Updated Vital Signs BP (!) 120/49 (BP Location: Right Arm)   Pulse 92   Temp 98.3  F (36.8 C) (Oral)   Resp 18   LMP 05/10/1992 Comment: partial  SpO2 96%   Physical Exam Vitals and nursing note reviewed.  Constitutional:      Appearance: Normal appearance. She is well-developed.     Comments: Sitting comfortably on examination table  HENT:     Head: Normocephalic and atraumatic.  Eyes:     General: Lids are normal.     Conjunctiva/sclera: Conjunctivae normal.     Pupils: Pupils are equal, round, and reactive to light.  Cardiovascular:     Rate and Rhythm: Normal rate and regular rhythm.     Pulses: Normal pulses.     Heart sounds: Normal heart sounds. No murmur. No friction rub. No gallop.   Pulmonary:     Effort: Pulmonary effort is normal.     Breath sounds: Normal breath sounds.     Comments: Lungs clear to auscultation bilaterally.  Symmetric chest rise.  No wheezing, rales, rhonchi. Abdominal:     Palpations: Abdomen is soft. Abdomen is not rigid.     Tenderness: There is abdominal tenderness in the left lower quadrant. There is no right CVA tenderness, left CVA tenderness or guarding.     Comments: Abdomen is soft, nondistended.  Tenderness palpation the left lower quadrant.  No rigidity, guarding.  No CVA tenderness noted bilaterally.  Musculoskeletal:        General: Normal range of motion.     Cervical back: Full passive range of motion without pain.  Skin:    General: Skin is warm and dry.     Capillary Refill: Capillary refill takes less than 2 seconds.  Neurological:     Mental Status: She is alert and oriented to person, place, and time.  Psychiatric:        Speech: Speech normal.     ED Results / Procedures / Treatments   Labs (all labs ordered are listed, but only abnormal results are displayed) Labs Reviewed  COMPREHENSIVE METABOLIC PANEL - Abnormal; Notable for the following components:      Result Value   Glucose, Bld 210 (*)    BUN 28 (*)    Albumin 3.4 (*)    GFR calc non Af Amer 58 (*)    All other components within normal  limits  URINALYSIS, ROUTINE W REFLEX MICROSCOPIC - Abnormal; Notable for the following components:   Color, Urine AMBER (*)    APPearance CLOUDY (*)    Ketones, ur 5 (*)    Protein, ur 100 (*)    Leukocytes,Ua TRACE (*)    Bacteria, UA RARE (*)    All other components within normal limits  URINE CULTURE  LIPASE, BLOOD  CBC    EKG None  Radiology CT ABDOMEN PELVIS W CONTRAST  Result Date: 07/19/2019 CLINICAL DATA:  Abdominal pain the past 3 days in the left lower quadrant. EXAM: CT ABDOMEN AND PELVIS WITH CONTRAST TECHNIQUE: Multidetector CT imaging of the abdomen and pelvis was performed using the standard protocol following bolus administration of intravenous contrast. Automatic exposure control utilized. CONTRAST:  127mL OMNIPAQUE IOHEXOL 300 MG/ML  SOLN COMPARISON:  Oct 04, 2015 FINDINGS: Lower chest: Mild atherosclerotic calcification at the ostia of the right main coronary artery. Normal heart size. No dependent pleural effusion. Minimal subpleural atelectasis. Hepatobiliary: Mild hepatomegaly. Normal smooth hepatic contours without ascites. Interval development of hypoattenuation in the caudate lobe and junction of the right and left hemiliver measuring up to 3.4 cm diameter with indistinct margin, amorphous contour and surrounding the medial half of the intrahepatic inferior vena cava. Remote cholecystectomy. A 14 mm dilatation of the proximal common bile duct with normal distal tapering and no calcified choledocholithiasis. Pancreas: No apparent abnormality of the parenchyma or main pancreatic duct. Spleen: Normal. Adrenals/Urinary Tract: Normal bilateral adrenal glands. Nonobstructing punctate right renal calculus. No hydronephrosis or ureteral calculus bilaterally. Normal sizes and cortical thickness of both kidneys. Minimal symmetrical perinephric inflammatory change, likely medical renal disease. Mild perivesicular inflammatory change. Stomach/Bowel: Decompressed without apparent wall  thickening. No obstruction. Normal appendix, axial series 3, image 64. Mild sigmoid diverticulosis without apparent acute diverticulitis. Vascular/Lymphatic: No adenopathy. Thoracoabdominal aortic calcified atherosclerosis without an abdominal aortic aneurysm. Reproductive: Pessary placement. No apparent abnormality of the uterus for adnexae. Other: None. Musculoskeletal: Diffuse muscle atrophy. Diffuse bone demineralization and skeletal degenerative changes. No aggressive-appearing osseous lesion. IMPRESSION: 1. Mild nonspecific cystitis. Correlation with urinalysis may be useful. 2. Interval development of hypoattenuation in the caudate lobe and junction of the right and left hemiliver, with indistinct margin and surrounding the medial half of the intrahepatic inferior vena cava. This is favored to represent geographic fatty infiltration, however, recommend correlation with lab values and further evaluation with contrast-enhanced MRI liver protocol recommended. 3. Nonobstructing punctate right renal calculus. 4. Mild sigmoid diverticulosis without evidence of acute diverticulitis. 5. Aortic Atherosclerosis (ICD10-I70.0). 6. Pessary placement.  Electronically Signed   By: Revonda Humphrey   On: 07/19/2019 00:30    Procedures Procedures (including critical care time)  Medications Ordered in ED Medications  sodium chloride 0.9 % bolus 1,000 mL (0 mLs Intravenous Stopped 07/19/19 0128)  iohexol (OMNIPAQUE) 300 MG/ML solution 100 mL (100 mLs Intravenous Contrast Given 07/18/19 2320)    ED Course  I have reviewed the triage vital signs and the nursing notes.  Pertinent labs & imaging results that were available during my care of the patient were reviewed by me and considered in my medical decision making (see chart for details).    MDM Rules/Calculators/A&P                      79 year old female with past medical 3 diverticulitis, hypertension, hyperlipidemia who presents for evaluation of persistent  continued left lower quadrant abdominal pain that has been ongoing for the last 4 to 5 days.  Called GI doctor who prescribed Augmentin which she has been taking but continued of pain.  Also felt like occasionally, she would have a hard achy type pain in her left vagina/groin region.  Saw OB/GYN with no acute abnormalities.  Was sent to the ED for further evaluation.  Patient reports that pain in vagina has improved but still having some lingering left lower quadrant abdominal pain though is better than it was initially.  No fevers, vomiting, urinary complaints.  On initially arrival, she is afebrile, nontoxic-appearing.  Vital signs are stable.  On exam, she has tenderness palpation of the left lower quadrant.  No rigidity, guarding.  No CVA tenderness.  Consider diverticulitis versus kidney stone versus continued UTI/pyelonephritis.  Plan to check labs, urine.  Given that she is acutely tender in left lower quadrant and has been on antibiotics, will plan for CT on pelvis to ensure there is no complicated diverticulitis.  CMP shows glucose of 210, BUN of 28, creatinine of 0.94.  UA shows trace leukocytes, bacteria.  There is squamous epithelium.  CBC shows no leukocytosis.  Hemoglobin stable.  Lipase is unremarkable.  CT on pelvis shows mild nonspecific cystitis.  There is mention of a area of hypoattenuation in the liver that could be a fatty infiltration.  Recommend follow-up with MRI.  There is also nonobstructing punctate right kidney stone.  Mild sigmoid diverticulosis without any evidence of acute diverticulitis.  She had a urine culture performed on 07/16/19 that showed 25,000 colonies per unit.  No other findings.  At this time, she has no complaints and is not having any dysuria or hematuria.  She finished Bactrim is currently on Augmentin.  I discussed with her that she should continue taking the Augmentin to prevent any recurrence of the diverticulitis.  It is possible that she had active mild case  of diverticulitis and the Augmentin has helped improved it.  At this time, given that she is not having any dysuria or hematuria, do not feel that treating for UTI is in best interest.  She does have trace leukocytes but there is squamous epithelium so question contaminant.  Will send for culture.  I discussed with patient that if the culture is positive, she will be notified and started on antibiotics.  I discussed this with patient and she is in agreement.  Abdominal exam improved.  I also discussed with her regarding findings on liver for CT scan and directed her primary care doctor to follow-up with this. At this time, patient exhibits no emergent life-threatening condition that  require further evaluation in ED or admission. Patient had ample opportunity for questions and discussion. All patient's questions were answered with full understanding. Strict return precautions discussed. Patient expresses understanding and agreement to plan.   Portions of this note were generated with [employer]. Dictation errors may occur despite best attempts at proofreading.  Final Clinical Impression(s) / ED Diagnoses Final diagnoses:  Left lower quadrant abdominal pain    Rx / DC Orders ED Discharge Orders    None       Desma Mcgregor 07/19/19 [id number]    Carmin Muskrat, MD 07/19/19 1005

## 2019-07-18 NOTE — ED Triage Notes (Signed)
Pt reports LLQ pain since Sunday.  States she initially thought it was diverticulitis and gastroenterologist called RX in with no improvements.  She then thought it was vaginal- went to GYN and told it wasn't her vagina or uterus.   Denies pain at present because she states she is taking an old RX of Dilaudid for pain because Tylenol didn't help.

## 2019-07-18 NOTE — Telephone Encounter (Signed)
Spoke to [name] after speaking with Dr Sabra Heck. [name] given recommendations to be seen at ER due to unresolved pain and negative GYN exam on Monday. [name] agreeable and verbalized understanding.   Routing to Dr Sabra Heck for final review and will close encounter.

## 2019-07-18 NOTE — Telephone Encounter (Signed)
Patient is returning a call regarding her results.

## 2019-07-18 NOTE — Telephone Encounter (Signed)
Pt returned call for results. Pt given results per Dr Sabra Heck. Pt states having pain still and doesn't feel its her diverticulitis. Pt states taking old Rx for 2mg  Dilaudid 1 tablet q12 hrs and it has helped with pain. Pt wants to know if needs to be seen in ER due to pain still having. Pt is taking abx as given per Dr Sabra Heck. Will discuss with Dr Sabra Heck and return call to pt. Pt agreeable.   Routing to Dr Sabra Heck for additional recommendations.

## 2019-07-19 LAB — URINE CULTURE: Culture: <10000 — AB

## 2019-07-19 NOTE — ED Notes (Signed)
Patient verbalizes understanding of discharge instructions. Opportunity for questioning and answers were provided. Armband removed by staff, pt discharged from ED.  

## 2019-07-19 NOTE — Discharge Instructions (Addendum)
Keep taking the Augmentin as directed.   As we discussed yyou have a urine culture pending. If this is concerning for lingering infection, you will be notified about antibiotics.   Follow up with your primary care in the next 3-4 days.   You have some findings on your CT scan regarding your liver.  There was a small area of discoloration that could be a fatty infiltration of the liver.  This is not causing any abnormalities in your liver labs.  There is nothing to do about this tonight.  This needs to be followed up with your primary care doctor and may need further evaluation or imaging such as an MRI.  Return to the Emergency Department immediately if you experience any worsening abdominal pain, fever, persistent nausea and vomiting, inability keep any food down, pain with urination, blood in your urine or any other worsening or concerning symptoms.

## 2019-07-20 ENCOUNTER — Telehealth: Payer: Self-pay

## 2019-07-20 NOTE — Telephone Encounter (Signed)
Called the patient's listed mobile number. Female answers the phone. Asks me to call the home number [phone]. Called home number. No answer. Voicemail did not pick up.

## 2019-07-20 NOTE — Telephone Encounter (Signed)
-----   Message from Mauri Pole, MD sent at 07/20/2019 11:13 AM EST ----- Please schedule follow up visit with me or APP next available. Thanks ----- Message ----- From: Ladene Artist, MD Sent: 07/17/2019  11:03 AM EST To: Megan Salon, MD, Mauri Pole, MD  Vinnie Level,  Thank you for the follow up. Complete the course of antibiotics as prescribed and she should follow with her primary gastroenterologist Denzil Magnuson, MD.  Thanks,  Norberto Sorenson   ----- Message ----- From: Megan Salon, MD Sent: 07/16/2019   5:47 PM EST To: Ladene Artist, MD  Dr. Fuller Plan, Mrs. Gruber called you this weekend with possible diverticulitis.  She never started her antibiotics and had worsening pain.  She came to see me today and I think she does have diverticulitis.  She was feeling vaginal pain but she is most tender in her LLQ.  She was advised to start her antibiotics as you advised.  I ordered a CBC with diff and will communicate that result to her.  I will check in with her tomorrow.  Strict ER precautions were given as well.  Just wanted to give you some follow up.  Edwinna Areola

## 2019-07-20 NOTE — Telephone Encounter (Signed)
Patient called back and scheduled an appointment with Nicoletta Ba, PAC

## 2019-07-23 ENCOUNTER — Other Ambulatory Visit: Payer: Self-pay | Admitting: Family Medicine

## 2019-07-23 DIAGNOSIS — Z794 Long term (current) use of insulin: Secondary | ICD-10-CM

## 2019-07-23 DIAGNOSIS — E119 Type 2 diabetes mellitus without complications: Secondary | ICD-10-CM

## 2019-07-24 ENCOUNTER — Other Ambulatory Visit: Payer: Self-pay | Admitting: Pulmonary Disease

## 2019-07-26 ENCOUNTER — Other Ambulatory Visit: Payer: Self-pay

## 2019-07-26 ENCOUNTER — Ambulatory Visit: Payer: Medicare PPO | Admitting: Physician Assistant

## 2019-08-06 ENCOUNTER — Telehealth: Payer: Self-pay

## 2019-08-06 ENCOUNTER — Ambulatory Visit: Payer: Medicare PPO | Admitting: Physician Assistant

## 2019-08-06 ENCOUNTER — Encounter: Payer: Self-pay | Admitting: Physician Assistant

## 2019-08-06 VITALS — BP 110/68 | HR 88 | Temp 99.0°F | Ht 72.0 in | Wt 252.0 lb

## 2019-08-06 DIAGNOSIS — R932 Abnormal findings on diagnostic imaging of liver and biliary tract: Secondary | ICD-10-CM

## 2019-08-06 DIAGNOSIS — Z8719 Personal history of other diseases of the digestive system: Secondary | ICD-10-CM | POA: Diagnosis not present

## 2019-08-06 DIAGNOSIS — K610 Anal abscess: Secondary | ICD-10-CM

## 2019-08-06 DIAGNOSIS — R933 Abnormal findings on diagnostic imaging of other parts of digestive tract: Secondary | ICD-10-CM

## 2019-08-06 MED ORDER — FLUCONAZOLE 150 MG PO TABS: ORAL_TABLET | ORAL | 0 refills | Status: DC

## 2019-08-06 MED ORDER — AMOXICILLIN-POT CLAVULANATE 875-125 MG PO TABS: 1.0000 | ORAL_TABLET | Freq: Two times a day (BID) | ORAL | 0 refills | Status: AC

## 2019-08-06 NOTE — Progress Notes (Signed)
Subjective:    Patient ID: [name], female    DOB: 1940/06/12, 79 y.o.   MRN: [id number]  HPI [name] is a pleasant 79 year old white female, established with Dr. Fuller Plan, who comes in after recent ER visit on 07/18/2018 after she had been started on antibiotics for diverticulitis. Patient has history of recurrent diverticulitis, colon polyps, adult onset diabetes mellitus with peripheral neuropathy, IBS, GERD, obesity, hypertension and breast cancer.  Also with adenomatous colon polyps.  She had colonoscopy in August 2020 with 4 small polyps removed the largest 7 mm and path on all of the polyps consistent with tubular adenomas, noted to have left colon and sigmoid colon diverticuli. She had called here had been started on a course of Augmentin and then went to the emergency room on 07/18/2019 due to worsening of left-sided abdominal pain.  CT was done showing mild hepatomegaly, interval development of hypoattenuation in the caudate lobe of liver measuring 3.4 cm with indistinct margin.  This was questioned to be geographic fatty infiltration though lesion could not be ruled out an MRI recommended.  Also noted to have CBD of 14 mm in the proximal duct with normal tapering.  There was mild sigmoid diverticulosis but no diverticulitis and mild cystitis. Previous imaging had shown CBD of 8 mm in 2017.   Labs done at the time of ER visit with normal CBC and LFTs she did have an abnormal UA however urine culture had insignificant growth. She says her abdominal pain symptoms have completely resolved and she feels fine from that perspective.  She has new problem over the past 3 days. She says she has developed a" knot" in the area between her rectum and vagina which has been quite tender particularly with sitting.  She thinks she had a low-grade temp today in the 99 range, no bleeding or drainage from this area.  Bowel movements have been normal. Interestingly she had GYN visit earlier this month with  normal pelvic exam.  Review of Systems Pertinent positive and negative review of systems were noted in the above HPI section.  All other review of systems was otherwise negative.  Outpatient Encounter Medications as of 08/06/2019  Medication Sig  . atenolol (TENORMIN) 25 MG tablet Take 1 tablet by mouth once daily  . Bacillus Coagulans-Inulin (ALIGN PREBIOTIC-PROBIOTIC PO) Take 1 capsule by mouth daily.   . cetirizine (ZYRTEC) 10 MG tablet Take 10 mg by mouth daily.  . Cholecalciferol (VITAMIN D3) 125 MCG (5000 UT) TABS Take 5,000 Units by mouth daily.  . Coenzyme Q10 (COQ10) 200 MG CAPS Take 200 mg by mouth daily.  . colesevelam (WELCHOL) 625 MG tablet Take 1 tablet (625 mg total) by mouth 3 (three) times daily. NEEDS APPT FOR FURTHER REFILLS  . dicyclomine (BENTYL) 10 MG capsule TAKE 2 CAPSULES BY MOUTH 4 TIMES DAILY AS NEEDED  . dorzolamide-timolol (COSOPT) 22.3-6.8 MG/ML ophthalmic solution Place 1 drop into both eyes 2 (two) times daily.   [name] ESTRING 2 MG vaginal ring INSERT 1 RING  INTO VAGINA EVERY 3 MONTHS  . FLUoxetine (PROZAC) 40 MG capsule Take 1 capsule by mouth once daily  . glimepiride (AMARYL) 4 MG tablet Take 2 tablets (8 mg total) by mouth daily with breakfast.  . glucose blood (FREESTYLE LITE) test strip USE  STRIP TO CHECK GLUCOSE DAILY AND AS NEEDED  . hydrocortisone 2.5 % cream Apply 1 application topically 2 (two) times daily as needed (skin irritation/rash).   [name] ketoconazole (NIZORAL) 2 % cream  as needed.   . latanoprost (XALATAN) 0.005 % ophthalmic solution Place 1 drop into both eyes at bedtime.  [name] lisinopril (ZESTRIL) 10 MG tablet Take 1 tablet by mouth once daily  . lovastatin (MEVACOR) 10 MG tablet Take 1 tablet (10 mg total) by mouth once a week.  . Methylcellulose, Laxative, (CITRUCEL PO) Take 3 tablets by mouth at bedtime.  . Multiple Vitamin (MULTIVITAMIN WITH MINERALS) TABS tablet Take 1 tablet by mouth daily. Centrum Silver  . pramipexole (MIRAPEX) 0.5 MG  tablet TAKE 1 TABLET BY MOUTH AT BEDTIME  . Respiratory Therapy Supplies (FLUTTER) DEVI Use as directed  . Selenium 200 MCG CAPS Take 200 mcg by mouth at bedtime.   . SYMBICORT 160-4.5 MCG/ACT inhaler INHALE 2 PUFFS BY MOUTH INTO THE LUNGS TWICE DAILY  . temazepam (RESTORIL) 15 MG capsule TAKE 1 CAPSULE BY MOUTH AT BEDTIME AS NEEDED FOR  SLEEP  . VICTOZA 18 MG/3ML SOPN INJECT 0.3 ML (1.8 MG) INTO SKIN DAILY. APPOINTMENT NEEDED FOR FUTURE REFILLS.  [name] vitamin B-12 (CYANOCOBALAMIN) 1000 MCG tablet Take 1,000 mcg by mouth daily.  . [DISCONTINUED] fluconazole (DIFLUCAN) 150 MG tablet Take 150 mg by mouth as needed.   [name] amoxicillin-clavulanate (AUGMENTIN) 875-125 MG tablet Take 1 tablet by mouth 2 (two) times daily for 10 days.  . fluconazole (DIFLUCAN) 150 MG tablet Take 1 tablet by mouth today and then take 1 tablet in 3 days  . metroNIDAZOLE (METROGEL) 0.75 % gel   . [DISCONTINUED] NYSTATIN powder Apply 1 application topically daily.   No facility-administered encounter medications on file as of 08/06/2019.   Allergies  Allergen Reactions  . Nitrofurantoin Other (See Comments)    Severe headache HEADACHE  . Levaquin [Levofloxacin In D5w] Other (See Comments)    Pain in tendons   . Brimonidine Other (See Comments)    Made eyes and surrounding areas RED/ was an eye drop  . Cephalexin Diarrhea and Other (See Comments)    Patient can't remember reaction (per [facility] at [facility] states diarrhea)  . Erythromycin Ethylsuccinate Hives and Diarrhea  . Percocet [Oxycodone-Acetaminophen] Itching   Patient Active Problem List   Diagnosis Date Noted  . Healthcare maintenance 02/22/2019  . Physical deconditioning 02/22/2019  . Shortness of breath 02/22/2019  . Adenomatous polyp 12/20/2018  . Pain due to onychomycosis of toenails of both feet 11/08/2018  . Diabetic neuropathy ([place]) 11/08/2018  . History of total knee replacement, bilateral 11/14/2017  . Personal history of colonic polyps 05/18/2017  .  Arthritis of midfoot 03/03/2016  . Hammertoes of both feet 03/03/2016  . Aortic atherosclerosis ([place]) 02/16/2016  . Solitary pulmonary nodule 01/08/2016  . 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  . Bronchiectasis ([place]) 10/18/2015  . IBS (irritable bowel syndrome) 07/17/2014  . Recurrent major depression in partial remission ([place]) 01/03/2014  . Posterior tibial tendon dysfunction 01/03/2014  . Osteoarthritis, knee 01/03/2014  . Glaucoma 01/03/2014  . Hypertension associated with diabetes ([place]) 01/03/2014  . Obesity (BMI 30-39.9) 06/15/2013  . Primary open-angle glaucoma 08/02/2012  . Foot tendinitis 07/20/2010  . INSOMNIA, CHRONIC 07/25/2009  . Fatty liver 03/18/2009  . GERD 12/25/2007  . Hyperlipidemia associated with type 2 diabetes mellitus ([place]) 07/26/2007  . ANEMIA, B12 DEFICIENCY 12/29/2006  . Diabetes mellitus type II, controlled ([place]) 12/28/2006  . RESTLESS LEG SYNDROME, MILD 12/28/2006  . NEUROPATHY, IDIOPATHIC PERIPHERAL NEC 12/28/2006  . Osteoporosis 12/12/2006  . BREAST CANCER, HX OF 12/12/2006   Social History   Socioeconomic History  . Marital status: Married  Spouse name: Not on file  . Number of children: 1  . Years of education: Not on file  . Highest education level: Not on file  Occupational History  . Occupation: retired  Tobacco Use  . Smoking status: Never Smoker  . Smokeless tobacco: Never Used  Substance and Sexual Activity  . Alcohol use: No  . Drug use: No  . Sexual activity: Not Currently    Partners: Male    Birth control/protection: Post-menopausal  Other Topics Concern  . Not on file  Social History Narrative   Married 39 years in 2015. 1 adopted son. 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 Difficulty of Paying Living Expenses:   Food Insecurity:   . Worried About Charity fundraiser in the Last Year:     . Arboriculturist in the Last Year:   Transportation Needs:   . Film/video editor (Medical):   [name] Lack of Transportation (Non-Medical):   Physical Activity:   . Days of Exercise per Week:   . Minutes of Exercise per Session:   Stress:   . Feeling of Stress :   Social Connections:   . Frequency of Communication with Friends and Family:   . Frequency of Social Gatherings with Friends and Family:   . Attends Religious Services:   . Active Member of Clubs or Organizations:   . Attends Archivist Meetings:   [name] Marital Status:   Intimate Partner Violence:   . Fear of Current or Ex-Partner:   . Emotionally Abused:   [name] Physically Abused:   . Sexually Abused:     Ms. Mccay family history includes Arthritis in her mother; COPD in her father; Heart disease in her father; Hyperlipidemia in her father; Hypertension in her father; Pancreatic cancer in her paternal grandfather.      Objective:    Vitals:   08/06/19 1500  BP: 110/68  Pulse: 88  Temp: 99 F (37.2 C)    Physical Exam Well-developed well-nourished elderly white female in no acute distress.  Accompanied by her husband.  Patient sitting in a wheelchair, height, Weight 252, BMI 34.1  HEENT; nontraumatic normocephalic, EOMI, PER R LA, sclera anicteric. Oropharynx; not examined Neck; supple, no JVD Cardiovascular; regular rate and rhythm with S1-S2, no murmur rub or gallop Pulmonary; Clear bilaterally Abdomen; soft, large nontender, nondistended, no palpable mass or hepatosplenomegaly, bowel sounds are active Rectal; digital exam tender on the left, no palpable fullness or fluctuance, she has an indurated firm very tender area in the left perianal area anteriorly, measuring 5 to 6 cm, no drainage or fluctuance Skin; benign exam, no jaundice rash or appreciable lesions Extremities; no clubbing cyanosis or edema skin warm and dry Neuro/Psych; alert and oriented x4, grossly nonfocal mood and affect  appropriate       Assessment & Plan:   #53 79 year old white female with 3-day history of perirectal pain and swelling.  On exam today she has a left sided perirectal induration and tenderness concerning for perirectal abscess.  #2 recent mild diverticulitis, resolved #3 abnormal liver on recent CT scan with 3.4 cm area of hypoattenuation in the caudate lobe.  Rule out geographic fatty infiltration versus lesion. #4 dilated proximal CBD-patient is status post cholecystectomy, CBD was 8 mm in 2017  #5 history  of adenomatous colon polyps-up-to-date with colonoscopy #6 adult onset diabetes mellitus with neuropathy 7.  History of breast cancer 8.  GERD 9.  Hypertension  Plan; start Augmentin 875 mg p.o. twice daily x10 days Diflucan 150 mg p.o. day 1 then repeat in 3 days (patient gets recurrent vaginal candidiasis) Patient unable to do sitz bath's or soak in a tub due to debilitation.  Can use heating pad to perineum. Patient has been scheduled for appointment with [facility] tomorrow, may need I&D.  We will schedule for MRI of the liver, in a couple of weeks to allow her time to get over her acute issues.  Danell Verno Genia Harold PA-C 08/06/2019   Cc: Marin Olp, MD

## 2019-08-06 NOTE — Telephone Encounter (Signed)
  LAST APPOINTMENT DATE: 06/18/2019   NEXT APPOINTMENT DATE:@5 /17/2021  MEDICATION:glucose blood (FREESTYLE LITE) test strip  PHARMACY: [facility], [address] Phone:  [phone]  Fax:  [phone]       **Let patient know to contact pharmacy at the end of the day to make sure medication is ready. **  ** Please notify patient to allow 48-72 hours to process**  **Encourage patient to contact the pharmacy for refills or they can request refills through [facility]**  CLINICAL FILLS OUT ALL BELOW:   LAST REFILL:  QTY:  REFILL DATE:    OTHER COMMENTS:    Okay for refill?  Please advise

## 2019-08-06 NOTE — Patient Instructions (Addendum)
If you are age 80 or older, your body mass index should be between 23-30. Your Body mass index is 34.18 kg/m. If this is out of the aforementioned range listed, please consider follow up with your Primary Care Provider.  If you are age 83 or younger, your body mass index should be between 19-25. Your Body mass index is 34.18 kg/m. If this is out of the aformentioned range listed, please consider follow up with your Primary Care Provider.   START Augmentin 1 tablet twice daily for 10 days. START Diflucan 150 mg take 1 tablet today and repeat 1 tablet. Both have been sent to your pharmacy  You have been scheduled to be seen at [facility] 08/07/19 at 4:00 pm. Please arrive 15 minutes early. Bring a copy of your insurance card.  You have been scheduled for an MRI at [facility], located at [place]. Lawrence Santiago in the [facility]. Your appointment is scheduled on 08/20/19 at 8:00 am. Please arrive 30 minutes prior to your appointment time for registration purposes. Please make certain not to have anything to eat or drink after midnight. In addition, if you have any metal in your body, have a pacemaker or defibrillator, please be sure to let your ordering physician know. This test typically takes 45 minutes to 1 hour to complete. Should you need to reschedule, please call [phone].

## 2019-08-07 ENCOUNTER — Other Ambulatory Visit: Payer: Self-pay

## 2019-08-07 DIAGNOSIS — E119 Type 2 diabetes mellitus without complications: Secondary | ICD-10-CM

## 2019-08-07 MED ORDER — FREESTYLE LITE TEST VI STRP: ORAL_STRIP | 0 refills | Status: DC

## 2019-08-07 NOTE — Progress Notes (Signed)
Reviewed and agree with management plan.  Jermery Caratachea T. Carrol Bondar, MD FACG [facility]  

## 2019-08-08 NOTE — Telephone Encounter (Signed)
Please clarify why patient needs to stay on current strips and why pharmacy needs patient to change-it is likely a coverage issue-if we do not have a specific reason for her to continue the current strips I will not have any firm way to convince the insurance to allow the old meter/strips

## 2019-08-08 NOTE — Telephone Encounter (Signed)
Patient is calling about these test strips, states the pharmacy is needing her to change but patient has declined but is needing a letter from [employer] for the pharmacy to advise that she is to stay on the Freestyle strips. Patient is completely out of test strips.Marland KitchenMarland Kitchen

## 2019-08-08 NOTE — Telephone Encounter (Signed)
See below

## 2019-08-09 ENCOUNTER — Other Ambulatory Visit: Payer: Self-pay | Admitting: Family Medicine

## 2019-08-09 ENCOUNTER — Other Ambulatory Visit: Payer: Self-pay | Admitting: Gastroenterology

## 2019-08-09 DIAGNOSIS — K610 Anal abscess: Secondary | ICD-10-CM | POA: Diagnosis not present

## 2019-08-09 NOTE — Telephone Encounter (Signed)
Called patient she would like to stay with free style lite because she is comfortable with it and it is easy to use. Let her know that we will start auth but most likely will not be approved. We will let her know. She was also informed that it can take up to 5 business days for results.   [name] Key: [id number] - PA Case ID: [id number] Need help? Call us at [phone] Status Sent to [employer] [facility] FreeStyle Lite Test strips Form [employer] Electronic PA Form

## 2019-08-14 DIAGNOSIS — K610 Anal abscess: Secondary | ICD-10-CM | POA: Diagnosis not present

## 2019-08-16 ENCOUNTER — Encounter: Payer: Self-pay | Admitting: Family Medicine

## 2019-08-16 ENCOUNTER — Other Ambulatory Visit: Payer: Self-pay

## 2019-08-16 ENCOUNTER — Other Ambulatory Visit: Payer: Self-pay | Admitting: Family Medicine

## 2019-08-16 ENCOUNTER — Other Ambulatory Visit: Payer: Self-pay | Admitting: Physician Assistant

## 2019-08-16 MED ORDER — BLOOD GLUCOSE MONITOR KIT: PACK | 0 refills | Status: DC

## 2019-08-17 ENCOUNTER — Other Ambulatory Visit: Payer: Self-pay | Admitting: Physician Assistant

## 2019-08-20 ENCOUNTER — Ambulatory Visit (HOSPITAL_COMMUNITY)
Admission: RE | Admit: 2019-08-20 | Discharge: 2019-08-20 | Disposition: A | Discharge status: Home / Self Care | Payer: Medicare PPO | Source: Ambulatory Visit | Attending: Physician Assistant | Admitting: Physician Assistant

## 2019-08-20 ENCOUNTER — Other Ambulatory Visit: Payer: Self-pay

## 2019-08-20 DIAGNOSIS — R932 Abnormal findings on diagnostic imaging of liver and biliary tract: Secondary | ICD-10-CM | POA: Insufficient documentation

## 2019-08-20 DIAGNOSIS — R933 Abnormal findings on diagnostic imaging of other parts of digestive tract: Secondary | ICD-10-CM | POA: Diagnosis not present

## 2019-08-20 DIAGNOSIS — K760 Fatty (change of) liver, not elsewhere classified: Secondary | ICD-10-CM | POA: Diagnosis not present

## 2019-08-20 MED ORDER — GADOBUTROL 1 MMOL/ML IV SOLN
10.0000 mL | Freq: Once | INTRAVENOUS | Status: AC | PRN
  Administered 2019-08-20: 10 mL via INTRAVENOUS

## 2019-08-21 ENCOUNTER — Other Ambulatory Visit: Payer: Self-pay | Admitting: Gastroenterology

## 2019-08-21 ENCOUNTER — Telehealth: Payer: Self-pay

## 2019-08-21 ENCOUNTER — Other Ambulatory Visit: Payer: Self-pay | Admitting: Family Medicine

## 2019-08-21 ENCOUNTER — Other Ambulatory Visit: Payer: Self-pay

## 2019-08-21 MED ORDER — BLOOD GLUCOSE MONITOR KIT: PACK | 0 refills | Status: DC

## 2019-08-21 MED ORDER — ACCU-CHEK GUIDE VI STRP: ORAL_STRIP | 12 refills | Status: DC

## 2019-08-21 MED ORDER — ACCU-CHEK GUIDE W/DEVICE KIT: 1.0000 | PACK | Freq: Once | 0 refills | Status: AC

## 2019-08-21 NOTE — Telephone Encounter (Signed)
Ok to refill 

## 2019-08-21 NOTE — Telephone Encounter (Signed)
Patient is calling requesting to speak with [name] about her Diabetes meter, asking if she could give her a call. States she is wanting it sent to [place], [place] - [address] But is needing a written prescription.

## 2019-08-21 NOTE — Telephone Encounter (Signed)
Very relieved to hear the MRI report. States the area where she had the perianal abscess is still present. "About the size of a pecan." It is not tender. Does not seem to be getting larger. States "I am very aware of it. Encouraged to contact the triage nurse at surgeon's office. What is the timeline for healing? Does she need to be seen again?

## 2019-08-21 NOTE — Telephone Encounter (Signed)
-----   Message from Alfredia Ferguson, [place] sent at 08/21/2019 11:07 AM EDT ----- Please call pt and let her know the MRI  showed the are of concern on CT scan to be focal changes of fatty liver - good news- no liver mass , no worrisome findings

## 2019-08-21 NOTE — Progress Notes (Signed)
done

## 2019-08-23 ENCOUNTER — Other Ambulatory Visit: Payer: Self-pay

## 2019-08-23 ENCOUNTER — Other Ambulatory Visit: Payer: Self-pay | Admitting: Pulmonary Disease

## 2019-08-23 MED ORDER — ACCU-CHEK GUIDE VI STRP: ORAL_STRIP | 6 refills | Status: DC

## 2019-08-23 MED ORDER — ACCU-CHEK FASTCLIX LANCET KIT: PACK | 5 refills | Status: AC

## 2019-08-24 ENCOUNTER — Telehealth: Payer: Self-pay | Admitting: Physician Assistant

## 2019-08-24 NOTE — Telephone Encounter (Signed)
Refills sent to pharmacy. Patient has been notified.

## 2019-08-24 NOTE — Telephone Encounter (Signed)
Patient states she called yesterday for refill on Welchol only has two pills left please send

## 2019-08-28 NOTE — Telephone Encounter (Signed)
Prescription already sent to pharmacy.

## 2019-08-28 NOTE — Telephone Encounter (Signed)
Ok to send refill. Thanks!

## 2019-08-29 ENCOUNTER — Other Ambulatory Visit: Payer: Self-pay | Admitting: Family Medicine

## 2019-09-04 ENCOUNTER — Other Ambulatory Visit: Payer: Self-pay | Admitting: Gastroenterology

## 2019-09-11 ENCOUNTER — Ambulatory Visit (HOSPITAL_COMMUNITY): Payer: Self-pay | Admitting: Surgery

## 2019-09-11 DIAGNOSIS — K610 Anal abscess: Secondary | ICD-10-CM | POA: Diagnosis not present

## 2019-09-11 DIAGNOSIS — K603 Anal fistula: Secondary | ICD-10-CM | POA: Diagnosis not present

## 2019-09-11 NOTE — H&P (Signed)
[name] Appointment: 09/11/2019 11:30 AM Location: [facility] Patient #: [phone] DOB: Jan 05, 1941 Married / Language: English / Race: White Female  History of Present Illness Adin Hector MD; 09/11/2019 1:31 PM) The patient is a 79 year old female who presents with a complaint of anal problems. Note for "Anal problems": ` ` ` Patient sent for surgical consultation at the request of Carlena Hurl  Chief Complaint: Persistent/recurrent pain s/p I&D anal abscess ` ` The patient is a pleasant obese woman struggling with recurrent perianal pain and swelling. She underwent incision and drainage for an abscess in urgent clinic. Seemed to improve but then had recurrent symptoms. Received another course of Augmentin antibiotics. She can get yeast infections on oral antibiotics. Improved with Diflucan. It seemed to calm down but never really go away. She began having worsening pain and swelling and wished to be seen again. She felt worsening pain and swelling and finally something started drain in the sitz bath yesterday. Feeling better but still comfortable. Husband is here today. She takes Citrucel for many years. Followed by [facility] for irritable bowel. Usually moves her bowels once or twice a day. No history of prior anorectal issues or hemorrhoids. She does not smoke. She is a diabetic. A1c is around 8. Not insulin requiring. She underwent knee surgery 3 years ago without difficulty and had a normal stress test in 2018 followed by Dr. Marlou Porch with the [facility].   No personal nor family history of GI/colon cancer, inflammatory bowel disease, allergy such as Celiac Sprue, dietary/dairy problems, colitis, ulcers nor gastritis. No recent sick contacts/gastroenteritis. No travel outside the country. No changes in diet. No dysphagia to solids or liquids. No significant heartburn or reflux. No hematochezia, hematemesis,  coffee ground emesis. No evidence of prior gastric/peptic ulceration.  (Review of systems as stated in this history (HPI) or in the review of systems. Otherwise all other 12 point ROS are negative) ` ` `  This patient encounter took 40 minutes today to perform the following: obtain history, perform exam, review outside records, interpret tests & imaging, counsel the patient on their diagnosis; and, document this encounter, including findings & plan in the electronic health record (EHR).   Allergies Sabino Gasser, CMA; 09/11/2019 12:03 PM) Cephalexin *CEPHALOSPORINS* Erythromycin *MACROLIDES* LevoFLOXacin *CHEMICALS* Nitrofurantoin *URINARY ANTI-INFECTIVES* OxyCODONE HCl (Abuse Deter) *ANALGESICS - OPIOID* Allergies Reconciled  Medication History Sabino Gasser, CMA; 09/11/2019 12:03 PM) Amoxicillin-Pot Clavulanate (875-125MG  Tablet, Oral) Active. Symbicort (160-4.5MCG/ACT Aerosol, Inhalation) Active. Atenolol (25MG  Tablet, Oral) Active. Durezol (0.05% Emulsion, Ophthalmic) Active. Glimepiride (4MG  Tablet, Oral) Active. FLUoxetine HCl (40MG  Capsule, Oral) Active. Meloxicam (15MG  Tablet, Oral) Active. ProAir HFA (108 (90 Base)MCG/ACT Aerosol Soln, Inhalation) Active. Victoza (18MG /3ML Soln Pen-inj, Subcutaneous) Active. Lisinopril (10MG  Tablet, Oral) Active. Pramipexole Dihydrochloride (0.5MG  Tablet, Oral) Active. Terconazole (0.4% Cream, Vaginal) Active. Medications Reconciled    Vitals Sabino Gasser CMA; 09/11/2019 12:04 PM) 09/11/2019 12:03 PM Height: 72in Weight Measurement Declined Temp.: 73F(Tympanic)  Pulse: 87 (Regular)  BP: 128/72(Sitting, Left Arm, Standard)        Physical Exam Adin Hector MD; 09/11/2019 12:59 PM)  General Mental Status-Alert. General Appearance-Not in acute distress, Not Sickly. Orientation-Oriented X3. Hydration-Well hydrated. Voice-Normal. Note: Tends to use wheelchair. Can transfer to bed with  one person assist. Bright and alert.  Integumentary Global Assessment Upon inspection and palpation of skin surfaces of the - Axillae: non-tender, no inflammation or ulceration, no drainage. and Distribution of scalp and body hair is normal. General  Characteristics Temperature - normal warmth is noted.  Head and Neck Head-normocephalic, atraumatic with no lesions or palpable masses. Face Global Assessment - atraumatic, no absence of expression. Neck Global Assessment - no abnormal movements, no bruit auscultated on the right, no bruit auscultated on the left, no decreased range of motion, non-tender. Trachea-midline. Thyroid Gland Characteristics - non-tender.  Eye Eyeball - Left-Extraocular movements intact, No Nystagmus - Left. Eyeball - Right-Extraocular movements intact, No Nystagmus - Right. Cornea - Left-No Hazy - Left. Cornea - Right-No Hazy - Right. Sclera/Conjunctiva - Left-No scleral icterus, No Discharge - Left. Sclera/Conjunctiva - Right-No scleral icterus, No Discharge - Right. Pupil - Left-Direct reaction to light normal. Pupil - Right-Direct reaction to light normal.  ENMT Ears Pinna - Left - no drainage observed, no generalized tenderness observed. Pinna - Right - no drainage observed, no generalized tenderness observed. Nose and Sinuses External Inspection of the Nose - no destructive lesion observed. Inspection of the nares - Left - quiet respiration. Inspection of the nares - Right - quiet respiration. Mouth and Throat Lips - Upper Lip - no fissures observed, no pallor noted. Lower Lip - no fissures observed, no pallor noted. Nasopharynx - no discharge present. Oral Cavity/Oropharynx - Tongue - no dryness observed. Oral Mucosa - no cyanosis observed. Hypopharynx - no evidence of airway distress observed.  Chest and Lung Exam Inspection Movements - Normal and Symmetrical. Accessory muscles - No use of accessory muscles in  breathing. Palpation Palpation of the chest reveals - Non-tender. Auscultation Breath sounds - Normal and Clear.  Cardiovascular Auscultation Rhythm - Regular. Murmurs & Other Heart Sounds - Auscultation of the heart reveals - No Murmurs and No Systolic Clicks.  Abdomen Inspection Inspection of the abdomen reveals - No Visible peristalsis and No Abnormal pulsations. Umbilicus - No Bleeding, No Urine drainage. Palpation/Percussion Palpation and Percussion of the abdomen reveal - Soft, Non Tender, No Rebound tenderness, No Rigidity (guarding) and No Cutaneous hyperesthesia. Note: Abdomen soft. Not severely distended. No distasis recti. No umbilical or other anterior abdominal wall hernias  Female Genitourinary Sexual Maturity Tanner 5 - Adult hair pattern. Note: No vaginal bleeding nor discharge  Rectal Note: Left anterior perianal swelling consistent with recurrent draining abscess. Purulent drainage 15 mm from anal verge. Erythema heading to base of left labia about 5 cm  . No fissure. No other abscesses. Minimal external hemorrhoids. Sphincter tone normal. I held off on internal exam.  Peripheral Vascular Upper Extremity Inspection - Left - No Cyanotic nailbeds - Left, Not Ischemic. Inspection - Right - No Cyanotic nailbeds - Right, Not Ischemic.  Neurologic Neurologic evaluation reveals -normal attention span and ability to concentrate, able to name objects and repeat phrases. Appropriate fund of knowledge , normal sensation and normal coordination. Mental Status Affect - not angry, not paranoid. Cranial Nerves-Normal Bilaterally. Gait-Normal.  Neuropsychiatric Mental status exam performed with findings of-able to articulate well with normal speech/language, rate, volume and coherence, thought content normal with ability to perform basic computations and apply abstract reasoning and no evidence of hallucinations, delusions, obsessions or homicidal/suicidal  ideation.  Musculoskeletal Global Assessment Spine, Ribs and Pelvis - no instability, subluxation or laxity. Right Upper Extremity - no instability, subluxation or laxity.  Lymphatic Head & Neck  General Head & Neck Lymphatics: Bilateral - Description - No Localized lymphadenopathy. Axillary  General Axillary Region: Bilateral - Description - No Localized lymphadenopathy. Femoral & Inguinal  Generalized Femoral & Inguinal Lymphatics: Left - Description - No Localized lymphadenopathy. Right - Description -  No Localized lymphadenopathy.    Assessment & Plan Adin Hector MD; 09/11/2019 1:30 PM)  PERIANAL ABSCESS (K61.0) Impression: Recurrent perianal abscess. Probably fistula. Continue warm soaks. Augmentin and Diflucan. Examination anesthesia soon   ANAL FISTULA (K60.3) Impression: Recurrent perirectal abscesses in the same location highly suspicious for fistula.  I think she would benefit from examination under anesthesia. Possible fistula repair. Possible fistulotomy versus seton placement.  Because she has recurrent abscess infection, we'll try and cool this off. 2 Augmentin. She tolerated that well despite her many antibiotic allergies. 10 day course with refills. We'll give her fluconazole since she seems to get yeast infections with any oral antibiotics.  She is very motivated to consider surgery.  While she has some limited mobility, she had good cardiac function a few years ago on a stress test followed by Dr. Marlou Porch. Tolerated her knee replacement without any new issues. I don't think we need to repeat that since she has otherwise been stable  Current Plans Pt Education - [facility] Abscess/Fistula (AT): discussed with patient and provided information. The anatomy & physiology of the anorectal region was discussed. We discussed the pathophysiology of anorectal abscess and fistula. Differential diagnosis was discussed. Natural history progression was discussed. I stressed  the importance of a bowel regimen to have daily soft bowel movements to minimize progression of disease.  The patient's condition is not adequately controlled. Non-operative treatment has not healed the fistula. Therefore, I recommended examination under anaesthesia to confirm the diagnosis and treat the fistula. I discussed techniques that may be required such as fistulotomy, ligation by LIFT technique, and/or seton placement. Benefits & alternatives discussed. I noted a good likelihood this will help address the problem, but sometimes repeat operations and prolonged healing times may occur. Risks such as bleeding, pain, recurrence, reoperation, incontinence, heart attack, death, and other risks were discussed.  Educational handouts further explaining the pathology, treatment options, and bowel regimen were given. The patient expressed understanding & wishes to proceed. We will work to coordinate surgery for a mutually convenient time.  Pt Education - [facility] Rectal Surgery HCI (Alexis Reber): discussed with patient and provided information. Started Amoxicillin-Pot Clavulanate 875-125 MG Oral Tablet, 1 (one) Tablet two times daily, 10 days starting 09/11/2019, Ref. x2. Started Fluconazole 150 MG Oral Tablet, 1 (one) Tablet daily, 1 day starting 09/11/2019, Ref. Kurtis Bushman, MD, FACS, MASCRS Gastrointestinal and Minimally Invasive Surgery  [facility] [address] [place], [place] [address] [phone] Fax [phone] Main/Paging  CONTACT INFORMATION: Weekday (9AM-5PM) concerns: Call [facility] main office at [phone] Weeknight (5PM-9AM) or Weekend/Holiday concerns: Check [email or web address] for General Surgery [facility] coverage (Please, do not use SecureChat as it is not reliable communication to surgeons for patient care)

## 2019-09-19 ENCOUNTER — Other Ambulatory Visit: Payer: Self-pay | Admitting: Family Medicine

## 2019-09-20 NOTE — Progress Notes (Signed)
GYNECOLOGY  VISIT  CC:   estring removal/replacement  HPI: 79 y.o. [name] Married White or Caucasian female here for Estring removal and replacement.  Pt was diagnosed with perirectal abscess.  She has seen Dr. Johney Maine and will be having surgery 11/22/2019.  She wants to know if the estring should stay out or be removed prior to surgery.  Due to her mobility issues and lack of knowing how long she will need to have the seton placed, we discussed removal today and not replacing new one.  She is in agreement with this.    GYNECOLOGIC HISTORY: Patient's last menstrual period was 05/10/1992. Contraception: hysterectomy Menopausal hormone therapy: estring  Patient Active Problem List   Diagnosis Date Noted  . Healthcare maintenance 02/22/2019  . Physical deconditioning 02/22/2019  . Shortness of breath 02/22/2019  . Adenomatous polyp 12/20/2018  . Pain due to onychomycosis of toenails of both feet 11/08/2018  . Diabetic neuropathy ([place]) 11/08/2018  . History of total knee replacement, bilateral 11/14/2017  . Personal history of colonic polyps 05/18/2017  . Arthritis of midfoot 03/03/2016  . Hammertoes of both feet 03/03/2016  . Aortic atherosclerosis ([place]) 02/16/2016  . Solitary pulmonary nodule 01/08/2016  . 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  . Bronchiectasis ([place]) 10/18/2015  . IBS (irritable bowel syndrome) 07/17/2014  . Recurrent major depression in partial remission ([place]) 01/03/2014  . Posterior tibial tendon dysfunction 01/03/2014  . Osteoarthritis, knee 01/03/2014  . Glaucoma 01/03/2014  . Hypertension associated with diabetes ([place]) 01/03/2014  . Obesity (BMI 30-39.9) 06/15/2013  . Primary open-angle glaucoma 08/02/2012  . Foot tendinitis 07/20/2010  . INSOMNIA, CHRONIC 07/25/2009  . Fatty liver 03/18/2009  . GERD 12/25/2007  . Hyperlipidemia associated with type 2 diabetes mellitus ([place]) 07/26/2007  . ANEMIA, B12 DEFICIENCY 12/29/2006  . Diabetes mellitus type II, controlled ([place])  12/28/2006  . RESTLESS LEG SYNDROME, MILD 12/28/2006  . NEUROPATHY, IDIOPATHIC PERIPHERAL NEC 12/28/2006  . Osteoporosis 12/12/2006  . BREAST CANCER, HX OF 12/12/2006    Past Medical History:  Diagnosis Date  . Allergy   . Anemia   . Anxiety   . Arthritis    right knee;injections every 88month   . Asthma   . Bronchiectasis ([place]   . Cancer ([place] 1994/1995   HX BREAST CANCER/ right brreast and left breast in 1995  . COMPRESSION FRACTURE, LUMBAR VERTEBRAE 08/21/2008   Qualifier: Diagnosis of  By: JArnoldo MoraleMD, JBalinda Quails  . Depression   . Diabetes mellitus    takes Amaryl and Januvia daily  . Difficulty sleeping   . Diverticulitis   . Early cataracts, bilateral   . Eczema   . Endometriosis   . Gastritis   . Glaucoma   . Hammer toe   . Headache(784.0)    Migraines  . Hyperlipidemia associated with type 2 diabetes mellitus ([place] 07/26/2007   Diet/exercise control.    . Hypertension    takes Atenolol daily  . IBS (irritable bowel syndrome)   . Joint pain   . Kidney stone   . Neuropathy   . Open wound of second toe of left foot in past   at pre-op appt, wound appears clear of infection 1 month post removal of toenail, no obvious exudate present nor any redness,  . Osteomyelitis ([place]    left 2nd toe  . Osteopenia   . PONV (postoperative nausea and vomiting)   . Posterior tibial tendon dysfunction    left foot  . Scoliosis   . Severe esophageal dysplasia   .  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 Thyroid disease   . Vitamin D deficiency    takes Vit d daily  . Weakness    uses a walker and wheelchair    Past Surgical History:  Procedure Laterality Date  . ADENOIDECTOMY     at age 35  . BIOPSY  12/19/2018   Procedure: BIOPSY;  Surgeon: Mauri Pole, MD;  Location: [facility];  Service: Endoscopy;;  . CATARACT EXTRACTION    . CHOLECYSTECTOMY  06/2008  . COLONOSCOPY WITH PROPOFOL N/A 05/17/2017   Procedure: COLONOSCOPY WITH  PROPOFOL;  Surgeon: Mauri Pole, MD;  Location: [facility];  Service: Endoscopy;  Laterality: N/A;  PT WILL BE ADMITTED THE DAY BEFORE FOR PREP PER ROBIN KB  . COLONOSCOPY WITH PROPOFOL N/A 12/19/2018   Procedure: COLONOSCOPY WITH PROPOFOL;  Surgeon: Mauri Pole, MD;  Location: [facility];  Service: Endoscopy;  Laterality: N/A;  . DILATION AND CURETTAGE OF UTERUS    . excision on breast     internal infected suture from breast surgery  . excision removed from neck     infected lymph node  . FOOT SURGERY     left foot-cleaning out areas on toes at the wound center-having MRI to determine if osteomyelitis  . HYSTEROSCOPY  06/22/11   PMB submucosal myoma  . JOINT REPLACEMENT     left total shoulder  . LAPAROSCOPIC OOPHORECTOMY Right 12/2004   absent LSO  . LAPAROTOMY    . MASTECTOMY Bilateral 1994 right, 1995 left  . POLYPECTOMY  12/19/2018   Procedure: POLYPECTOMY;  Surgeon: Mauri Pole, MD;  Location: [facility];  Service: Endoscopy;;  . SHOULDER HEMI-ARTHROPLASTY  01/01/2012   Procedure: SHOULDER HEMI-ARTHROPLASTY;  Surgeon: Roseanne Kaufman, MD;  Location: [facility];  Service: Orthopedics;  Laterality: Left;  Left Shoulder Hemi Arthroplasty with Repair and Reconstruction as Necessary   . THYROIDECTOMY     60yr ago. follows endocrine  . TONSILLECTOMY    . TOTAL KNEE ARTHROPLASTY Right 12/30/2014   Procedure: RIGHT TOTAL KNEE ARTHROPLASTY;  Surgeon: FGaynelle Arabian MD;  Location: [facility];  Service: Orthopedics;  Laterality: Right;  . TOTAL KNEE ARTHROPLASTY Left 11/29/2016   Procedure: LEFT TOTAL KNEE ARTHROPLASTY;  Surgeon: AGaynelle Arabian MD;  Location: [facility];  Service: Orthopedics;  Laterality: Left;    MEDS:   Current Outpatient Medications on File Prior to Visit  Medication Sig Dispense Refill  . atenolol (TENORMIN) 25 MG tablet Take 1 tablet by mouth once daily 90 tablet 0  . Bacillus Coagulans-Inulin (ALIGN PREBIOTIC-PROBIOTIC PO) Take 1 capsule by mouth  daily.     . blood glucose meter kit and supplies KIT Dispense based on patient and insurance preference. Use up to four times daily as directed. Dx E11.9 1 each 0  . cetirizine (ZYRTEC) 10 MG tablet Take 10 mg by mouth daily.    . Cholecalciferol (VITAMIN D3) 125 MCG (5000 UT) TABS Take 5,000 Units by mouth daily.    . Coenzyme Q10 (COQ10) 200 MG CAPS Take 200 mg by mouth daily.    .Marland Kitchendicyclomine (BENTYL) 10 MG capsule TAKE 2 CAPSULES BY MOUTH 4 TIMES DAILY AS NEEDED 120 capsule 0  . dorzolamide-timolol (COSOPT) 22.3-6.8 MG/ML ophthalmic solution Place 1 drop into both eyes 2 (two) times daily.     .Marland KitchenESTRING 2 MG vaginal ring INSERT 1 RING  INTO VAGINA EVERY 3 MONTHS 1 each 3  . FLUoxetine (PROZAC) 40  MG capsule Take 1 capsule by mouth once daily 90 capsule 0  . glimepiride (AMARYL) 4 MG tablet Take 2 tablets (8 mg total) by mouth daily with breakfast. 180 tablet 3  . glucose blood (ACCU-CHEK GUIDE) test strip Use to test blood sugars daily. Dx: E11.9 100 each 6  . hydrocortisone 2.5 % cream Apply 1 application topically 2 (two) times daily as needed (skin irritation/rash).     [name] ketoconazole (NIZORAL) 2 % cream as needed.     . Lancets Misc. (ACCU-CHEK FASTCLIX LANCET) KIT Use to test blood sugars daily. Dx: E11.9 1 kit 5  . latanoprost (XALATAN) 0.005 % ophthalmic solution Place 1 drop into both eyes at bedtime.    [name] lisinopril (ZESTRIL) 10 MG tablet TAKE 1 TABLET BY MOUTH ONCE DAILY . APPOINTMENT REQUIRED FOR FUTURE REFILLS 90 tablet 0  . lovastatin (MEVACOR) 10 MG tablet Take 1 tablet (10 mg total) by mouth every other day. 45 tablet 3  . Methylcellulose, Laxative, (CITRUCEL PO) Take 3 tablets by mouth at bedtime.    . Multiple Vitamin (MULTIVITAMIN WITH MINERALS) TABS tablet Take 1 tablet by mouth daily. Centrum Silver    . pramipexole (MIRAPEX) 0.5 MG tablet TAKE 1 TABLET BY MOUTH AT BEDTIME 90 tablet 0  . Respiratory Therapy Supplies (FLUTTER) DEVI Use as directed 1 each 0  . Selenium 200  MCG CAPS Take 200 mcg by mouth at bedtime.     . SYMBICORT 160-4.5 MCG/ACT inhaler INHALE 2 PUFFS BY MOUTH TWICE DAILY . 10.2 g 3  . temazepam (RESTORIL) 15 MG capsule TAKE 1 CAPSULE BY MOUTH AT BEDTIME AS NEEDED FOR  SLEEP 90 capsule 5  . VICTOZA 18 MG/3ML SOPN INJECT 1.8MG INTO THE SKIN DAILY. NEED APPT 9 mL 0  . vitamin B-12 (CYANOCOBALAMIN) 1000 MCG tablet Take 1,000 mcg by mouth daily.    . colesevelam (WELCHOL) 625 MG tablet Take 1 tablet (625 mg total) by mouth in the morning, at noon, and at bedtime. (Patient not taking: Reported on 10/02/2019) 90 tablet 3   No current facility-administered medications on file prior to visit.    ALLERGIES: Nitrofurantoin, Levaquin [levofloxacin in d5w], Brimonidine, Cephalexin, Erythromycin ethylsuccinate, and Oxycodone  Family History  Problem Relation Age of Onset  . Arthritis Mother   . COPD Father   . Heart disease Father        MI 15 - states he died of lockjaw  . Hypertension Father   . Hyperlipidemia Father   . Pancreatic cancer Paternal Grandfather   . Colon cancer Neg Hx   . Esophageal cancer Neg Hx   . Rectal cancer Neg Hx   . Stomach cancer Neg Hx     SH:  Married, non smoker  Review of Systems  Constitutional: Negative.   HENT: Negative.   Eyes: Negative.   Respiratory: Negative.   Cardiovascular: Negative.   Gastrointestinal: Negative.   Endocrine: Negative.   Genitourinary: Negative.   Musculoskeletal: Negative.   Skin: Negative.   Allergic/Immunologic: Negative.   Neurological: Negative.   Hematological: Negative.   Psychiatric/Behavioral: Negative.     PHYSICAL EXAMINATION:    BP 116/70   Pulse 68   Temp (!) 97.3 F (36.3 C) (Skin)   Resp 16   LMP 05/10/1992 Comment: partial    General appearance: alert, cooperative and appears stated age Lymph:  no inguinal LAD noted  Pelvic: External genitalia:  Significant perianal erythema (likely due to chronic moisture)  Urethra:  normal appearing  urethra with no masses, tenderness or lesions              Bartholins and Skenes: normal                 Vagina: normal appearing vagina with normal color and discharge, no lesions, Estring grasped and removed              Cervix: no lesions              Bimanual Exam:  Uterus:  normal size, contour, position, consistency, mobility, non-tender              Adnexa: no mass, fullness, tenderness              Anus:  Draining left perirectal abscess   Chaperone, Royal Hawthorn, CMA, was present for exam.  Assessment: Left perirectal abscess Vaginal atrophy  Plan: Pt will call if/when it she would like and can have Estring replaced.  Pt cannot reach this so has it done in the office.

## 2019-09-24 ENCOUNTER — Ambulatory Visit (INDEPENDENT_AMBULATORY_CARE_PROVIDER_SITE_OTHER): Payer: Medicare PPO | Admitting: Family Medicine

## 2019-09-24 ENCOUNTER — Other Ambulatory Visit: Payer: Self-pay

## 2019-09-24 ENCOUNTER — Encounter: Payer: Self-pay | Admitting: Family Medicine

## 2019-09-24 VITALS — BP 130/78 | HR 88 | Temp 98.2°F | Ht 72.0 in | Wt 251.0 lb

## 2019-09-24 DIAGNOSIS — I10 Essential (primary) hypertension: Secondary | ICD-10-CM | POA: Diagnosis not present

## 2019-09-24 DIAGNOSIS — E785 Hyperlipidemia, unspecified: Secondary | ICD-10-CM

## 2019-09-24 DIAGNOSIS — E1159 Type 2 diabetes mellitus with other circulatory complications: Secondary | ICD-10-CM | POA: Diagnosis not present

## 2019-09-24 DIAGNOSIS — E119 Type 2 diabetes mellitus without complications: Secondary | ICD-10-CM | POA: Diagnosis not present

## 2019-09-24 DIAGNOSIS — E1169 Type 2 diabetes mellitus with other specified complication: Secondary | ICD-10-CM | POA: Diagnosis not present

## 2019-09-24 DIAGNOSIS — F3341 Major depressive disorder, recurrent, in partial remission: Secondary | ICD-10-CM

## 2019-09-24 DIAGNOSIS — I152 Hypertension secondary to endocrine disorders: Secondary | ICD-10-CM

## 2019-09-24 MED ORDER — LOVASTATIN 10 MG PO TABS: 10.0000 mg | ORAL_TABLET | ORAL | 3 refills | Status: DC

## 2019-09-24 NOTE — Progress Notes (Signed)
Phone [phone] In person visit   Subjective:   [name] is a 79 y.o. year old very pleasant female patient who presents for/with See problem oriented charting Chief Complaint  Patient presents with  . Hypertension   This visit occurred during the SARS-CoV-2 public health emergency.  Safety protocols were in place, including screening questions prior to the visit, additional usage of staff PPE, and extensive cleaning of exam room while observing appropriate contact time as indicated for disinfecting solutions.   Past Medical History-  Patient Active Problem List   Diagnosis Date Noted  . 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    Priority: High  . Bronchiectasis ([place]) 10/18/2015    Priority: High  . Diabetes mellitus type II, controlled ([place]) 12/28/2006    Priority: High  . Adenomatous polyp 12/20/2018    Priority: Medium  . Personal history of colonic polyps 05/18/2017    Priority: Medium  . Aortic atherosclerosis ([place]) 02/16/2016    Priority: Medium  . Solitary pulmonary nodule 01/08/2016    Priority: Medium  . IBS (irritable bowel syndrome) 07/17/2014    Priority: Medium  . Recurrent major depression in partial remission ([place]) 01/03/2014    Priority: Medium  . Hypertension associated with diabetes ([place]) 01/03/2014    Priority: Medium  . Primary open-angle glaucoma 08/02/2012    Priority: Medium  . Fatty liver 03/18/2009    Priority: Medium  . Hyperlipidemia associated with type 2 diabetes mellitus ([place]) 07/26/2007    Priority: Medium  . ANEMIA, B12 DEFICIENCY 12/29/2006    Priority: Medium  . RESTLESS LEG SYNDROME, MILD 12/28/2006    Priority: Medium  . Osteoporosis 12/12/2006    Priority: Medium  . History of total knee replacement, bilateral 11/14/2017    Priority: Low  . Arthritis of midfoot 03/03/2016    Priority: Low  . Hammertoes of both feet 03/03/2016    Priority: Low  . Posterior tibial tendon dysfunction 01/03/2014    Priority: Low  . Osteoarthritis,  knee 01/03/2014    Priority: Low  . Glaucoma 01/03/2014    Priority: Low  . Obesity (BMI 30-39.9) 06/15/2013    Priority: Low  . Foot tendinitis 07/20/2010    Priority: Low  . INSOMNIA, CHRONIC 07/25/2009    Priority: Low  . GERD 12/25/2007    Priority: Low  . NEUROPATHY, IDIOPATHIC PERIPHERAL NEC 12/28/2006    Priority: Low  . BREAST CANCER, HX OF 12/12/2006    Priority: Low  . Healthcare maintenance 02/22/2019  . Physical deconditioning 02/22/2019  . Shortness of breath 02/22/2019  . Pain due to onychomycosis of toenails of both feet 11/08/2018  . Diabetic neuropathy ([place]) 11/08/2018    Medications- reviewed and updated Current Outpatient Medications  Medication Sig Dispense Refill  . atenolol (TENORMIN) 25 MG tablet Take 1 tablet by mouth once daily 90 tablet 0  . Bacillus Coagulans-Inulin (ALIGN PREBIOTIC-PROBIOTIC PO) Take 1 capsule by mouth daily.     . blood glucose meter kit and supplies KIT Dispense based on patient and insurance preference. Use up to four times daily as directed. Dx E11.9 1 each 0  . cetirizine (ZYRTEC) 10 MG tablet Take 10 mg by mouth daily.    . Cholecalciferol (VITAMIN D3) 125 MCG (5000 UT) TABS Take 5,000 Units by mouth daily.    . Coenzyme Q10 (COQ10) 200 MG CAPS Take 200 mg by mouth daily.    . colesevelam (WELCHOL) 625 MG tablet Take 1 tablet (625 mg total) by mouth in the morning, at noon, and  at bedtime. 90 tablet 3  . dicyclomine (BENTYL) 10 MG capsule TAKE 2 CAPSULES BY MOUTH 4 TIMES DAILY AS NEEDED 120 capsule 0  . dorzolamide-timolol (COSOPT) 22.3-6.8 MG/ML ophthalmic solution Place 1 drop into both eyes 2 (two) times daily.     [name] ESTRING 2 MG vaginal ring INSERT 1 RING  INTO VAGINA EVERY 3 MONTHS 1 each 3  . fluconazole (DIFLUCAN) 150 MG tablet Take 1 tablet by mouth today and then take 1 tablet in 3 days 2 tablet 0  . FLUoxetine (PROZAC) 40 MG capsule Take 1 capsule by mouth once daily 90 capsule 0  . glimepiride (AMARYL) 4 MG tablet  Take 2 tablets (8 mg total) by mouth daily with breakfast. 180 tablet 3  . glucose blood (ACCU-CHEK GUIDE) test strip Use to test blood sugars daily. Dx: E11.9 100 each 6  . hydrocortisone 2.5 % cream Apply 1 application topically 2 (two) times daily as needed (skin irritation/rash).     [name] ketoconazole (NIZORAL) 2 % cream as needed.     . Lancets Misc. (ACCU-CHEK FASTCLIX LANCET) KIT Use to test blood sugars daily. Dx: E11.9 1 kit 5  . latanoprost (XALATAN) 0.005 % ophthalmic solution Place 1 drop into both eyes at bedtime.    [name] lisinopril (ZESTRIL) 10 MG tablet TAKE 1 TABLET BY MOUTH ONCE DAILY . APPOINTMENT REQUIRED FOR FUTURE REFILLS 90 tablet 0  . lovastatin (MEVACOR) 10 MG tablet Take 1 tablet (10 mg total) by mouth once a week. 13 tablet 3  . Methylcellulose, Laxative, (CITRUCEL PO) Take 3 tablets by mouth at bedtime.    . metroNIDAZOLE (METROGEL) 0.75 % gel     . Multiple Vitamin (MULTIVITAMIN WITH MINERALS) TABS tablet Take 1 tablet by mouth daily. Centrum Silver    . pramipexole (MIRAPEX) 0.5 MG tablet TAKE 1 TABLET BY MOUTH AT BEDTIME 90 tablet 0  . Respiratory Therapy Supplies (FLUTTER) DEVI Use as directed 1 each 0  . Selenium 200 MCG CAPS Take 200 mcg by mouth at bedtime.     . SYMBICORT 160-4.5 MCG/ACT inhaler Inhale 2 puffs by mouth twice daily 11 g 0  . temazepam (RESTORIL) 15 MG capsule TAKE 1 CAPSULE BY MOUTH AT BEDTIME AS NEEDED FOR  SLEEP 90 capsule 5  . VICTOZA 18 MG/3ML SOPN INJECT 1.8MG INTO THE SKIN DAILY. NEED APPT 9 mL 0  . vitamin B-12 (CYANOCOBALAMIN) 1000 MCG tablet Take 1,000 mcg by mouth daily.     No current facility-administered medications for this visit.     Objective:  BP 130/78   Pulse 88   Temp 98.2 F (36.8 C) (Temporal)   Ht 6' (1.829 m)   Wt 251 lb (113.9 kg)   LMP 05/10/1992 Comment: partial  SpO2 94%   BMI 34.04 kg/m  Gen: NAD, resting comfortably CV: RRR no murmurs rubs or gallops Lungs: CTAB no crackles, wheeze, rhonchi Ext: no  edema Skin: warm, dry Neuro: in wheelchair    Assessment and Plan   # perirectal fistula. Possible hemorrhoidectomy anorectal under anesthesia.  - if surgery is required would require pulmonary and cardiology referral- though did have reassuring stress test in 2018  # Diabetes S: Medication: Opposed to starting insulin.  Last visit she wanted to cut out her ice cream (cut down but not out) and continue Victoza daily and glimepiride 8 mg. -She is concerned about yeast infections on SGLT2 inhibitor.  Her Metformin-had GI effects CBGs- 160s in mornings.  Exercise and diet- doing some seated  bike pedaling  Lab Results  Component Value Date   HGBA1C 7.8 (H) 06/18/2019   HGBA1C 7.5 (H) 10/04/2018   HGBA1C 5.6 12/29/2017   A/P: hopefully a1c is at least under 8- continue to work on lifestyle as other options limited with her not wanting to use insulin and with GI issues on metformin   #hypertension S: medication: Atenolol 25 mg daily lisinopril 10 mg Home readings #s: not checking BP Readings from Last 3 Encounters:  09/24/19 130/78  08/06/19 110/68  07/19/19 (!) 120/49  A/P: Stable. Continue current medications.    #hyperlipidemia/aortic atherosclerosis- incidental finding CT scan S: Medication: lovastatin 12m once a week Lab Results  Component Value Date   CHOL 167 06/18/2019   HDL 46.60 06/18/2019   LDLCALC 86 06/18/2019   LDLDIRECT 77.0 02/16/2016   TRIG 172.0 (H) 06/18/2019   CHOLHDL 4 06/18/2019   A/P: she is doing well on lovastatin once a week-previously had fatigue on rosuvastatin 5 mg daily-we are going to increase lovastatin to every other day and see how she does with this-may need to increase further in the future if tolerable   # Depression S: Medication: Fluoxetine 40 mg.  Symptoms have worsened with COVID-19 pandemic.  For insomnia she is on temazepam but still sleeps very poorly-12-3 and then 6-9- now has a pretty erratic schedule- sleeps  Any time of day or  night (in recliner 23 hours a day).  Counseling with LLattie Hawwas not particularly beneficial for her.  Plan was for patient to increase exercise and become more active in the community after second COVID-19 vaccination.she has been out once a week to eat or doctor. They are going to go to the beach at end of May.  Depression screen [facility] 09/24/2019 06/18/2019 01/30/2019  Decreased Interest _0 Down, Depressed, Hopeless _1 PHQ - 2 Score _2 Altered sleeping 2 0 1  Tired, decreased energy _3 Change in appetite 0 0 1  Feeling bad or failure about yourself  0 0 0  Trouble concentrating 0 3 0  Moving slowly or fidgety/restless 0 0 0  Suicidal thoughts 0 0 0  PHQ-9 Score _4 Difficult doing work/chores Somewhat difficult Not difficult at all Somewhat difficult  Some recent data might be hidden  A/P: Poor control with partial remission only- recommended more socialization.  -encouraged her to get back into card group- shes not sure that is going to happen -enjoys music- encouraged her to explore this - declines med increases such as adding wellbutrin as she thinks this is situational with covid 19  -wants to work on cleaning up house to have someone over potentially (but really enjoys purchasing things which makes it hard) - has a hard time reading after prior eye issues- discussed books on tape - more consistent bedtime would be helpful  Recommended follow up:  Future Appointments  Date Time Provider [place] 10/02/2019  4:00 PM MMegan Salon [facility]  10/31/2019  3:45 PM MGardiner Barefoot DPM [facility]  11/19/2019  1:00 PM [facility] None    Lab/Order associations:   ICD-10-CM   1. Controlled type 2 diabetes mellitus without complication, without long-term current use of insulin (HCC)  E11.9 Comprehensive metabolic panel    Hemoglobin A1c  2. Hypertension associated with diabetes ([place]  E11.59    I10   3. Hyperlipidemia associated  with type 2 diabetes mellitus ([place]  E11.69    E78.5   4. Recurrent major depression in partial remission (HCC)  F33.41     Meds ordered this encounter  Medications  . lovastatin (MEVACOR) 10 MG tablet    Sig: Take 1 tablet (10 mg total) by mouth every other day.    Dispense:  45 tablet    Refill:  3   Return precautions advised.  [name], MD

## 2019-09-24 NOTE — Patient Instructions (Addendum)
Health Maintenance Due  Topic Date Due  . Samul Dada  Will get at pharmacy  12/24/2017   Please stop by lab before you go If you have mychart- we will send your results within 3 business days of [name] receiving them.  If you do not have mychart- we will call you about results within 5 business days of [name] receiving them.    Recommended follow up: Return in about 14 weeks (around 12/31/2019) for follow up- or sooner if needed.

## 2019-09-25 LAB — COMPREHENSIVE METABOLIC PANEL WITH GFR
ALT: 19 U/L (ref 0–35)
AST: 20 U/L (ref 0–37)
Albumin: 3.9 g/dL (ref 3.5–5.2)
Alkaline Phosphatase: 80 U/L (ref 39–117)
BUN: 12 mg/dL (ref 6–23)
CO2: 33 meq/L — ABNORMAL HIGH (ref 19–32)
Calcium: 9.4 mg/dL (ref 8.4–10.5)
Chloride: 103 meq/L (ref 96–112)
Creatinine, Ser: 0.57 mg/dL (ref 0.40–1.20)
GFR: 102.13 mL/min
Glucose, Bld: 125 mg/dL — ABNORMAL HIGH (ref 70–99)
Potassium: 4.5 meq/L (ref 3.5–5.1)
Sodium: 140 meq/L (ref 135–145)
Total Bilirubin: 0.4 mg/dL (ref 0.2–1.2)
Total Protein: 6.7 g/dL (ref 6.0–8.3)

## 2019-09-25 LAB — HEMOGLOBIN A1C: Hgb A1c MFr Bld: 7.6 % — ABNORMAL HIGH (ref 4.6–6.5)

## 2019-09-26 ENCOUNTER — Other Ambulatory Visit: Payer: Self-pay

## 2019-09-26 ENCOUNTER — Telehealth: Payer: Self-pay | Admitting: Family Medicine

## 2019-09-26 MED ORDER — LOVASTATIN 10 MG PO TABS: 10.0000 mg | ORAL_TABLET | ORAL | 3 refills | Status: DC

## 2019-09-26 NOTE — Telephone Encounter (Signed)
Pt states Dendron did not receive script for lovastatin from her visit yesterday with Dr. Yong Channel. Please resent.

## 2019-09-26 NOTE — Telephone Encounter (Signed)
Lovstatin resent.

## 2019-09-27 ENCOUNTER — Other Ambulatory Visit: Payer: Self-pay | Admitting: Pulmonary Disease

## 2019-09-29 ENCOUNTER — Ambulatory Visit (HOSPITAL_COMMUNITY): Payer: Self-pay

## 2019-09-29 ENCOUNTER — Telehealth (HOSPITAL_BASED_OUTPATIENT_CLINIC_OR_DEPARTMENT_OTHER): Payer: Self-pay | Admitting: Neurology

## 2019-09-29 ENCOUNTER — Other Ambulatory Visit (HOSPITAL_COMMUNITY): Payer: Self-pay

## 2019-09-29 ENCOUNTER — Ambulatory Visit: Payer: Self-pay

## 2019-09-29 DIAGNOSIS — R4789 Other speech disturbances: Secondary | ICD-10-CM

## 2019-09-29 NOTE — Progress Notes (Signed)
<  STROKE OR TELESTROKE>    AIRWAY (INTUBATED/BIPAP/NC/RA): Not Intubated  IMAGING (as applicable): N/A  Stroke MD conferenced:   Dr. Steffanie Dunn  Is this going to be a TeleStroke Video consult?: Yes  Stroke MDs plan for consult or transfer: Consult  Stroke MD returned call and is conferenced with ED Medicine Attn, if transfer: N/A  Additional images being sent: N/A  # OF DRIPS (for [facility] only): N/A    Does the patient currently have an un-stabilized Emergency Medical Condition, trauma, burn or is in active labor? N/A  ---If YES, proceed with transfer, skip to next section---  ---If NO, is patient on Inpatient Status? N/A  -----------If YES: Does patient have [employer]? N/A If yes, document name of person at [facility] who gave auth:  -----------If NO: skip to next section    COVID-19 test questions:  COVID PCR test? N/A  COVID Results: N/A    PLAN:  Consult    [facility]: Can I activate [employer], our preferred service to transport this patient?: N/A  If [facility] is transporting, TC notified [facility]:

## 2019-10-01 ENCOUNTER — Other Ambulatory Visit: Payer: Self-pay | Admitting: Gastroenterology

## 2019-10-01 ENCOUNTER — Other Ambulatory Visit: Payer: Self-pay | Admitting: Pulmonary Disease

## 2019-10-01 ENCOUNTER — Telehealth: Payer: Self-pay | Admitting: Critical Care Medicine

## 2019-10-01 MED ORDER — SYMBICORT 160-4.5 MCG/ACT IN AERO: INHALATION_SPRAY | RESPIRATORY_TRACT | 3 refills | Status: DC

## 2019-10-01 NOTE — Addendum Note (Signed)
Addended by: Valerie Salts on: 10/01/2019 10:49 AM   Modules accepted: Orders

## 2019-10-01 NOTE — Telephone Encounter (Signed)
Rx sent to pharmacy for pt of her symbicort inhaler. Called and spoke with pt letting her know this had been done and stated to her to make sure she kept her upcoming appt and she verbalized understanding. Nothing further needed.

## 2019-10-02 ENCOUNTER — Other Ambulatory Visit: Payer: Self-pay

## 2019-10-02 ENCOUNTER — Encounter: Payer: Self-pay | Admitting: Obstetrics & Gynecology

## 2019-10-02 ENCOUNTER — Ambulatory Visit: Payer: Medicare PPO | Admitting: Obstetrics & Gynecology

## 2019-10-02 VITALS — BP 116/70 | HR 68 | Temp 97.3°F | Resp 16

## 2019-10-02 DIAGNOSIS — N952 Postmenopausal atrophic vaginitis: Secondary | ICD-10-CM | POA: Diagnosis not present

## 2019-10-03 NOTE — Progress Notes (Signed)
As a qualified practitioner in the [facility] at [facility], I conducted this TeleStroke encounter via secure, live, interactive audio and video telecommunications with the patient and physician at the referring medical center.     Present with the patient was a clinician at the referring medical center who acted as the examiner during the encounter.  Prior to the encounter, the risks and benefits of a telemedicine visit were discussed, and verbal consent was obtained.     See progress note below or attached to this encounter for more detailed information.

## 2019-10-04 ENCOUNTER — Encounter: Payer: Self-pay | Admitting: Obstetrics & Gynecology

## 2019-10-15 ENCOUNTER — Other Ambulatory Visit: Payer: Self-pay | Admitting: Family Medicine

## 2019-10-18 ENCOUNTER — Other Ambulatory Visit: Payer: Self-pay

## 2019-10-18 MED ORDER — GLUCOSE BLOOD VI STRP: ORAL_STRIP | 1 refills | Status: DC

## 2019-10-18 MED ORDER — ACCU-CHEK GUIDE VI STRP: ORAL_STRIP | 6 refills | Status: DC

## 2019-10-19 ENCOUNTER — Other Ambulatory Visit: Payer: Self-pay | Admitting: Family Medicine

## 2019-10-19 DIAGNOSIS — C50912 Malignant neoplasm of unspecified site of left female breast: Secondary | ICD-10-CM | POA: Diagnosis not present

## 2019-10-19 DIAGNOSIS — C50911 Malignant neoplasm of unspecified site of right female breast: Secondary | ICD-10-CM | POA: Diagnosis not present

## 2019-10-25 ENCOUNTER — Encounter: Payer: Self-pay | Admitting: Critical Care Medicine

## 2019-10-25 ENCOUNTER — Ambulatory Visit: Payer: Medicare PPO | Admitting: Critical Care Medicine

## 2019-10-25 ENCOUNTER — Other Ambulatory Visit: Payer: Self-pay

## 2019-10-25 VITALS — BP 122/64 | HR 84 | Temp 98.1°F | Ht 72.0 in | Wt 255.0 lb

## 2019-10-25 DIAGNOSIS — J479 Bronchiectasis, uncomplicated: Secondary | ICD-10-CM

## 2019-10-25 DIAGNOSIS — R5381 Other malaise: Secondary | ICD-10-CM | POA: Diagnosis not present

## 2019-10-25 DIAGNOSIS — J301 Allergic rhinitis due to pollen: Secondary | ICD-10-CM | POA: Diagnosis not present

## 2019-10-25 MED ORDER — FLUTICASONE PROPIONATE 50 MCG/ACT NA SUSP
2.0000 | Freq: Every day | NASAL | 11 refills | Status: DC
Start: 2019-10-25 — End: 2020-08-13

## 2019-10-25 MED ORDER — SYMBICORT 160-4.5 MCG/ACT IN AERO: INHALATION_SPRAY | RESPIRATORY_TRACT | 11 refills | Status: DC

## 2019-10-25 NOTE — Patient Instructions (Addendum)
Thank you for visiting Dr. Carlis Abbott at [facility]. We recommend the following: No orders of the defined types were placed in this encounter.  No orders of the defined types were placed in this encounter.   Meds ordered this encounter  Medications  . SYMBICORT 160-4.5 MCG/ACT inhaler    Sig: INHALE 2 PUFFS BY MOUTH TWICE DAILY .    Dispense:  10.2 g    Refill:  11  . fluticasone (FLONASE) 50 MCG/ACT nasal spray    Sig: Place 2 sprays into both nostrils daily.    Dispense:  16 g    Refill:  11    Return in about 6 months (around 04/25/2020).    Please do your part to reduce the spread of COVID-19.

## 2019-10-25 NOTE — Progress Notes (Signed)
Synopsis: Referred in 2017 for bronchitis by Marin Olp, MD. Formerly a patient of Dr. Lake Bells.  Subjective:   PATIENT ID: [name] GENDER: female DOB: 02-Jan-1941, MRN: [id number]  Chief Complaint  Patient presents with   Follow-up    former [facility] pt, DOE, dry cough, sometimes productive with clear sputum    [name] is a 79 year old woman who presents for follow-up of chronic bronchiectasis.  She is accompanied today by her husband.  She continues to use Symbicort twice daily.  She has occasional clear sputum production, mostly has a dry cough.  She has sinus congestion, ear fullness, and recently ear popping that she attributes to seasonal allergies.  She uses cetirizine daily.  She formerly only had sinus symptoms in the fall, not the summer.  Her breathing is at baseline with occasional wheezing.  She has dyspnea on exertion that she attributes to deconditioning.  She does not walk by herself much at home due to fear of falling.  She has a stationary bike that she does not like to use due to a foot deformity.  Her husband is frustrated at her level of inactivity and shortness of breath.  She uses her flutter valve twice daily x 10.  She never smoked.  Her bronchiectasis is likely secondary to frequent episodes of bronchitis as a child.  She has tracheomalacia discovered during CT scan which does not cause significant symptoms.    Past Medical History:  Diagnosis Date   Allergy    Anemia    Anxiety    Arthritis    right knee;injections every 57month    Asthma    Bronchiectasis ([place]    Cancer ([place] 1994/1995   HX BREAST CANCER/ right brreast and left breast [place] LUMBAR VERTEBRAE 08/21/2008   Qualifier: Diagnosis of  By: JArnoldo MoraleMD, JBalinda Quails   Depression    Diabetes mellitus    takes Amaryl and Januvia daily   Difficulty sleeping    Diverticulitis    Early cataracts, bilateral    Eczema    Endometriosis    Gastritis     Glaucoma    Hammer toe    Headache(784.0)    Migraines   Hyperlipidemia associated with type 2 diabetes mellitus ([place] 07/26/2007   Diet/exercise control.     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   Thyroid disease    Vitamin D deficiency    takes Vit d daily   Weakness    uses a walker and wheelchair     Family History  Problem Relation Age of Onset   Arthritis Mother    COPD Father    Heart disease Father        MI 857- states he died of lockjaw   Hypertension Father    Hyperlipidemia  Father    Pancreatic cancer Paternal Grandfather    Colon cancer Neg Hx    Esophageal cancer Neg Hx    Rectal cancer Neg Hx    Stomach cancer Neg Hx      Past Surgical History:  Procedure Laterality Date   ADENOIDECTOMY     at age 82   BIOPSY  12/19/2018   Procedure: BIOPSY;  Surgeon: Mauri Pole, MD;  Location: [facility];  Service: Endoscopy;;   CATARACT EXTRACTION     CHOLECYSTECTOMY  06/2008   COLONOSCOPY WITH PROPOFOL N/A 05/17/2017   Procedure: COLONOSCOPY WITH PROPOFOL;  Surgeon: Mauri Pole, MD;  Location: [facility];  Service: Endoscopy;  Laterality: N/A;  PT WILL BE ADMITTED THE DAY BEFORE FOR PREP PER ROBIN KB   COLONOSCOPY WITH PROPOFOL N/A 12/19/2018   Procedure: COLONOSCOPY WITH PROPOFOL;  Surgeon: Mauri Pole, MD;  Location: [facility];  Service: Endoscopy;  Laterality: N/A;   DILATION AND  CURETTAGE OF UTERUS     excision on breast     internal infected suture from breast surgery   excision removed from neck     infected lymph node   FOOT SURGERY     left foot-cleaning out areas on toes at the wound center-having MRI to determine if osteomyelitis   HYSTEROSCOPY  06/22/11   PMB submucosal myoma   JOINT REPLACEMENT     left total shoulder   LAPAROSCOPIC OOPHORECTOMY Right 12/2004   absent LSO   LAPAROTOMY     MASTECTOMY Bilateral 1994 right, 1995 left   POLYPECTOMY  12/19/2018   Procedure: POLYPECTOMY;  Surgeon: Mauri Pole, MD;  Location: [facility];  Service: Endoscopy;;   SHOULDER HEMI-ARTHROPLASTY  01/01/2012   Procedure: SHOULDER HEMI-ARTHROPLASTY;  Surgeon: Roseanne Kaufman, MD;  Location: [facility];  Service: Orthopedics;  Laterality: Left;  Left Shoulder Hemi Arthroplasty with Repair and Reconstruction as Necessary    THYROIDECTOMY     56yr ago. follows endocrine   TONSILLECTOMY     TOTAL KNEE ARTHROPLASTY Right 12/30/2014   Procedure: RIGHT TOTAL KNEE ARTHROPLASTY;  Surgeon: FGaynelle Arabian MD;  Location: [facility];  Service: Orthopedics;  Laterality: Right;   TOTAL KNEE ARTHROPLASTY Left 11/29/2016   Procedure: LEFT TOTAL KNEE ARTHROPLASTY;  Surgeon: AGaynelle Arabian MD;  Location: [facility];  Service: Orthopedics;  Laterality: Left;    Social History   Socioeconomic History   Marital status: Married    Spouse name: Not on file   Number of children: 1   Years of education: Not on file   Highest education level: Not on file  Occupational History   Occupation: retired  Tobacco Use   Smoking status: Never Smoker   Smokeless tobacco: Never Used  VScientific laboratory technicianUse: Never used  Substance and Sexual Activity   Alcohol use: No   Drug use: No   Sexual activity: Not Currently    Partners: Male    Birth control/protection: Post-menopausal, Surgical    Comment: partial hysterectomy  Other Topics Concern   Not on file  Social  History Narrative   Married 39 years in 2015. 1 adopted son. 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[employer], [employer] sewing, decorate   Social Determinants of HRadio broadcast assistantStrain:    Difficulty of Paying Living Expenses:   Food Insecurity:    Worried About RCharity fundraiserin the Last  Year:    Arboriculturist in the Last Year:   Transportation Needs:    Film/video editor (Medical):    Lack of Transportation (Non-Medical):   Physical Activity:    Days of Exercise per Week:    Minutes of Exercise per Session:   Stress:    Feeling of Stress :   Social Connections:    Frequency of Communication with Friends and Family:    Frequency of Social Gatherings with Friends and Family:    Attends Religious Services:    Active Member of Clubs or Organizations:    Attends Archivist Meetings:    Marital Status:   Intimate Partner Violence:    Fear of Current or Ex-Partner:    Emotionally Abused:    Physically Abused:    Sexually Abused:      Allergies  Allergen Reactions   Nitrofurantoin Other (See Comments)    Severe headache HEADACHE   Levaquin [Levofloxacin In D5w] Other (See Comments)    Pain in tendons    Brimonidine Other (See Comments)    Made eyes and surrounding areas RED/ was an eye drop   Cephalexin Diarrhea and Other (See Comments)    Patient can't remember reaction (per [facility] states diarrhea) (tolerates Augmentin fine)   Erythromycin Ethylsuccinate Hives and Diarrhea   Oxycodone Itching     Immunization History  Administered Date(s) Administered   Fluad Quad(high Dose 65+) 02/22/2019   H1N1 06/05/2008   Influenza Split 02/07/2012   Influenza Whole 03/10/2000, 01/24/2008, 03/10/2009, 01/20/2010   Influenza, High Dose Seasonal PF 02/12/2013, 01/31/2017, 01/19/2018   Influenza,inj,Quad PF,6+ Mos 01/17/2014, 03/27/2015   Influenza,inj,quad, With  Preservative 02/07/2017   Influenza-Unspecified 02/09/2016   PFIZER SARS-COV-2 Vaccination 06/03/2019, 06/25/2019   Pneumococcal Conjugate-13 01/17/2014   Pneumococcal Polysaccharide-23 03/10/2000, 05/02/2007, 01/02/2012   Td 05/10/1992, 12/25/2007   Zoster Recombinat (Shingrix) 02/24/2018, 04/26/2018    Outpatient Medications Prior to Visit  Medication Sig Dispense Refill   atenolol (TENORMIN) 25 MG tablet Take 1 tablet by mouth once daily 90 tablet 0   Bacillus Coagulans-Inulin (ALIGN PREBIOTIC-PROBIOTIC PO) Take 1 capsule by mouth daily.      blood glucose meter kit and supplies KIT Dispense based on patient and insurance preference. Use up to four times daily as directed. Dx E11.9 1 each 0   cetirizine (ZYRTEC) 10 MG tablet Take 10 mg by mouth daily.     Cholecalciferol (VITAMIN D3) 125 MCG (5000 UT) TABS Take 5,000 Units by mouth daily.     Coenzyme Q10 (COQ10) 200 MG CAPS Take 200 mg by mouth daily.     colesevelam (WELCHOL) 625 MG tablet Take 1 tablet (625 mg total) by mouth in the morning, at noon, and at bedtime. 90 tablet 3   dicyclomine (BENTYL) 10 MG capsule TAKE 2 CAPSULES BY MOUTH 4 TIMES DAILY AS NEEDED 120 capsule 0   dorzolamide-timolol (COSOPT) 22.3-6.8 MG/ML ophthalmic solution Place 1 drop into both eyes 2 (two) times daily.      ESTRING 2 MG vaginal ring INSERT 1 RING  INTO VAGINA EVERY 3 MONTHS 1 each 3   FLUoxetine (PROZAC) 40 MG capsule Take 1 capsule by mouth once daily 90 capsule 0   glimepiride (AMARYL) 4 MG tablet Take 2 tablets (8 mg total) by mouth daily with breakfast. 180 tablet 3   glucose blood (ACCU-CHEK GUIDE) test strip Use to test blood sugars daily. Dx: E11.9 100 each 6   glucose blood test strip Use to  test up to twice a day 200 each 1   hydrocortisone 2.5 % cream Apply 1 application topically 2 (two) times daily as needed (skin irritation/rash).      ketoconazole (NIZORAL) 2 % cream as needed.      Lancets Misc. (ACCU-CHEK  FASTCLIX LANCET) KIT Use to test blood sugars daily. Dx: E11.9 1 kit 5   latanoprost (XALATAN) 0.005 % ophthalmic solution Place 1 drop into both eyes at bedtime.     lisinopril (ZESTRIL) 10 MG tablet TAKE 1 TABLET BY MOUTH ONCE DAILY . APPOINTMENT REQUIRED FOR FUTURE REFILLS 90 tablet 0   lovastatin (MEVACOR) 10 MG tablet Take 1 tablet (10 mg total) by mouth every other day. 45 tablet 3   Methylcellulose, Laxative, (CITRUCEL PO) Take 3 tablets by mouth at bedtime.     Multiple Vitamin (MULTIVITAMIN WITH MINERALS) TABS tablet Take 1 tablet by mouth daily. Centrum Silver     pramipexole (MIRAPEX) 0.5 MG tablet TAKE 1 TABLET BY MOUTH AT BEDTIME 90 tablet 0   Respiratory Therapy Supplies (FLUTTER) DEVI Use as directed 1 each 0   Selenium 200 MCG CAPS Take 200 mcg by mouth at bedtime.      SYMBICORT 160-4.5 MCG/ACT inhaler INHALE 2 PUFFS BY MOUTH TWICE DAILY . 10.2 g 3   temazepam (RESTORIL) 15 MG capsule TAKE 1 CAPSULE BY MOUTH AT BEDTIME AS NEEDED FOR  SLEEP 90 capsule 5   VICTOZA 18 MG/3ML SOPN INJECT 1.'8MG'$  INTO THE SKIN ONCE DAILY. APPOINTMENT NEEDED. 9 mL 0   vitamin B-12 (CYANOCOBALAMIN) 1000 MCG tablet Take 1,000 mcg by mouth daily.     No facility-administered medications prior to visit.    ROS   Objective:   Vitals:   10/25/19 1608  BP: 122/64  Pulse: 84  Temp: 98.1 F (36.7 C)  TempSrc: Oral  SpO2: 93%  Weight: 255 lb (115.7 kg)  Height: 6' (1.829 m)   93% on   RA BMI Readings from Last 3 Encounters:  10/25/19 34.58 kg/m  09/24/19 34.04 kg/m  08/06/19 34.18 kg/m   Wt Readings from Last 3 Encounters:  10/25/19 255 lb (115.7 kg)  09/24/19 251 lb (113.9 kg)  08/06/19 252 lb (114.3 kg)    Physical Exam   CBC    Component Value Date/Time   WBC 8.8 07/18/2019 1745   RBC 4.22 07/18/2019 1745   HGB 13.4 07/18/2019 1745   HGB 14.4 07/16/2019 1327   HCT 42.0 07/18/2019 1745   HCT 44.2 07/16/2019 1327   PLT 281 07/18/2019 1745   PLT 306 07/16/2019  1327   MCV 99.5 07/18/2019 1745   MCV 95 07/16/2019 1327   MCH 31.8 07/18/2019 1745   MCHC 31.9 07/18/2019 1745   RDW 14.4 07/18/2019 1745   RDW 13.8 07/16/2019 1327   LYMPHSABS 2.3 07/16/2019 1327   MONOABS 0.7 10/04/2018 1402   EOSABS 0.5 (H) 07/16/2019 1327   BASOSABS 0.1 07/16/2019 1327    CHEMISTRY No results for input(s): NA, K, CL, CO2, GLUCOSE, BUN, CREATININE, CALCIUM, MG, PHOS in the last 168 hours. CrCl cannot be calculated (Patient's most recent lab result is older than the maximum 21 days allowed.).   Chest Imaging- films reviewed: CXR, 2 view 04/11/2018-bilateral axillary surgical clips, left hemishoulder arthroplasty.  Increased interstitial markings bilaterally, bronchiectasis most notable in the right upper and lower lobe.  HRCT 7/27/thousand 17-bilateral central bronchiectasis most notable in the lower lobes, RML, less in the lingula.  Significant tracheomalacia.  No significant mediastinal or hilar  adenopathy.  Pulmonary Functions Testing Results: PFT Results Latest Ref Rng & Units 12/02/2015  FVC-Pre L 2.52  FVC-Predicted Pre % 67  FVC-Post L 2.63  FVC-Predicted Post % 70  Pre FEV1/FVC % % 77  Post FEV1/FCV % % 77  FEV1-Pre L 1.94  FEV1-Predicted Pre % 68  FEV1-Post L 2.02  DLCO UNC% % 74  DLCO COR %Predicted % 94  TLC L 5.02  TLC % Predicted % 81  RV % Predicted % 91   2017-no significant obstruction or bronchodilator reversibility.  No significant restriction, although FEV1 and FVC are reduced.  Mild diffusion impairment is present.  Flow volume loop suggests restriction.    Myocardial perfusion imaging 09/09/2016:    Nuclear stress EF: 90%.  Blood pressure demonstrated a normal response to exercise.  There was no ST segment deviation noted during stress.  The study is normal.  This is a low risk study.  The left ventricular ejection fraction is hyperdynamic (>65%).    Assessment & Plan:     ICD-10-CM   1. Bronchiectasis without  complication ([place])  F57.3   2. Physical deconditioning  R53.81   3. Seasonal allergic rhinitis due to pollen  J30.1     Chronic bronchiectasis, likely secondary to recurrent bronchitis when she was younger -Continue Symbicort twice daily.  If she develops recurrent infections, will reconsider if ICS is appropriate for her. -Continue flutter valve twice daily x 10.  During episodes of acute exacerbations she should use this more frequently.  May need to add hypertonic saline nebs if having increase sputum production. -Would recommend obtaining a sputum culture if she develops increased sputum production. -Discussed the risks of chronic pulmonary infections and the importance of airway clearance therapy.  Chronic dyspnea on exertion due to deconditioning -Recommend regular physical activity, using caution to prevent falls.  I discussed having her husband help her walk around the house frequently throughout the day.  She should consider if physical therapy would be beneficial at home.  She has requested an upright walker, which she will ask her primary care physician about.  Seasonal allergic rhinitis -Start Flonase 2 sprays bilaterally once daily -Continue cetirizine daily  RTC in 6 months.    Current Outpatient Medications:    atenolol (TENORMIN) 25 MG tablet, Take 1 tablet by mouth once daily, Disp: 90 tablet, Rfl: 0   Bacillus Coagulans-Inulin (ALIGN PREBIOTIC-PROBIOTIC PO), Take 1 capsule by mouth daily. , Disp: , Rfl:    blood glucose meter kit and supplies KIT, Dispense based on patient and insurance preference. Use up to four times daily as directed. Dx E11.9, Disp: 1 each, Rfl: 0   cetirizine (ZYRTEC) 10 MG tablet, Take 10 mg by mouth daily., Disp: , Rfl:    Cholecalciferol (VITAMIN D3) 125 MCG (5000 UT) TABS, Take 5,000 Units by mouth daily., Disp: , Rfl:    Coenzyme Q10 (COQ10) 200 MG CAPS, Take 200 mg by mouth daily., Disp: , Rfl:    colesevelam (WELCHOL) 625 MG tablet,  Take 1 tablet (625 mg total) by mouth in the morning, at noon, and at bedtime., Disp: 90 tablet, Rfl: 3   dicyclomine (BENTYL) 10 MG capsule, TAKE 2 CAPSULES BY MOUTH 4 TIMES DAILY AS NEEDED, Disp: 120 capsule, Rfl: 0   dorzolamide-timolol (COSOPT) 22.3-6.8 MG/ML ophthalmic solution, Place 1 drop into both eyes 2 (two) times daily. , Disp: , Rfl:    ESTRING 2 MG vaginal ring, INSERT 1 RING  INTO VAGINA EVERY 3 MONTHS, Disp: 1  each, Rfl: 3   FLUoxetine (PROZAC) 40 MG capsule, Take 1 capsule by mouth once daily, Disp: 90 capsule, Rfl: 0   glimepiride (AMARYL) 4 MG tablet, Take 2 tablets (8 mg total) by mouth daily with breakfast., Disp: 180 tablet, Rfl: 3   glucose blood (ACCU-CHEK GUIDE) test strip, Use to test blood sugars daily. Dx: E11.9, Disp: 100 each, Rfl: 6   glucose blood test strip, Use to test up to twice a day, Disp: 200 each, Rfl: 1   hydrocortisone 2.5 % cream, Apply 1 application topically 2 (two) times daily as needed (skin irritation/rash). , Disp: , Rfl:    ketoconazole (NIZORAL) 2 % cream, as needed. , Disp: , Rfl:    Lancets Misc. (ACCU-CHEK FASTCLIX LANCET) KIT, Use to test blood sugars daily. Dx: E11.9, Disp: 1 kit, Rfl: 5   latanoprost (XALATAN) 0.005 % ophthalmic solution, Place 1 drop into both eyes at bedtime., Disp: , Rfl:    lisinopril (ZESTRIL) 10 MG tablet, TAKE 1 TABLET BY MOUTH ONCE DAILY . APPOINTMENT REQUIRED FOR FUTURE REFILLS, Disp: 90 tablet, Rfl: 0   lovastatin (MEVACOR) 10 MG tablet, Take 1 tablet (10 mg total) by mouth every other day., Disp: 45 tablet, Rfl: 3   Methylcellulose, Laxative, (CITRUCEL PO), Take 3 tablets by mouth at bedtime., Disp: , Rfl:    Multiple Vitamin (MULTIVITAMIN WITH MINERALS) TABS tablet, Take 1 tablet by mouth daily. Centrum Silver, Disp: , Rfl:    pramipexole (MIRAPEX) 0.5 MG tablet, TAKE 1 TABLET BY MOUTH AT BEDTIME, Disp: 90 tablet, Rfl: 0   Respiratory Therapy Supplies (FLUTTER) DEVI, Use as directed, Disp: 1 each,  Rfl: 0   Selenium 200 MCG CAPS, Take 200 mcg by mouth at bedtime. , Disp: , Rfl:    SYMBICORT 160-4.5 MCG/ACT inhaler, INHALE 2 PUFFS BY MOUTH TWICE DAILY ., Disp: 10.2 g, Rfl: 3   temazepam (RESTORIL) 15 MG capsule, TAKE 1 CAPSULE BY MOUTH AT BEDTIME AS NEEDED FOR  SLEEP, Disp: 90 capsule, Rfl: 5   VICTOZA 18 MG/3ML SOPN, INJECT 1.'8MG'$  INTO THE SKIN ONCE DAILY. APPOINTMENT NEEDED., Disp: 9 mL, Rfl: 0   vitamin B-12 (CYANOCOBALAMIN) 1000 MCG tablet, Take 1,000 mcg by mouth daily., Disp: , Rfl:     Julian Hy, DO [facility] 10/25/2019 4:19 PM

## 2019-10-29 ENCOUNTER — Other Ambulatory Visit: Payer: Self-pay | Admitting: Gastroenterology

## 2019-10-31 ENCOUNTER — Ambulatory Visit: Payer: Medicare PPO | Admitting: Podiatry

## 2019-11-06 DIAGNOSIS — C50912 Malignant neoplasm of unspecified site of left female breast: Secondary | ICD-10-CM | POA: Diagnosis not present

## 2019-11-06 DIAGNOSIS — C50911 Malignant neoplasm of unspecified site of right female breast: Secondary | ICD-10-CM | POA: Diagnosis not present

## 2019-11-08 ENCOUNTER — Other Ambulatory Visit: Payer: Self-pay | Admitting: Family Medicine

## 2019-11-14 ENCOUNTER — Encounter (HOSPITAL_BASED_OUTPATIENT_CLINIC_OR_DEPARTMENT_OTHER): Payer: Self-pay | Admitting: Surgery

## 2019-11-14 ENCOUNTER — Other Ambulatory Visit: Payer: Self-pay

## 2019-11-14 NOTE — Progress Notes (Signed)
NEW Covid Policy July 6378  Surgery Day:  11/22/2019  Facility:  [facility]  Type of Surgery: PERIRECTAL FISTULA REPAIR  Fully Covid Vaccinated:   1) 06/03/2019                                          2) 06/25/2019                                          Where? [facility]                                           Type? PFIZER  Do you have symptoms? NO  In the past 14 days:        Have you had any symptoms? NO       Have you been tested covid positive? NO       Have you been in contact with someone covid positive? NO        Is pt Immuno-compromised? NO

## 2019-11-14 NOTE — Progress Notes (Signed)
Spoke with: [name] NPO:  No food after midnight/Clear liquids until 9:30 AM DOS Arrival time: 1030 AM Lab needs dos----istat 8, ekg            Lab results------N/A COVID test ------No fully vaccinated on 06/25/2019, patient instructed to bring card Medications to take morning of surgery: Atenolol, Zyrtec, Prozac, Flonase, Inhaler, eyedrops Pre op orders in epic Yes Diabetic medication -----N/A Patient Special Instructions -----N/A Pre-Op special Istructions -----N/A Ride home: Jeneen Rinks (husband) [phone]  Patient verbalized understanding of instructions that were given at this phone interview. Patient denies shortness of breath, chest pain, fever, cough a this phone interview.

## 2019-11-15 ENCOUNTER — Other Ambulatory Visit: Payer: Self-pay | Admitting: Family Medicine

## 2019-11-18 ENCOUNTER — Other Ambulatory Visit: Payer: Self-pay | Admitting: Family Medicine

## 2019-11-19 ENCOUNTER — Other Ambulatory Visit (HOSPITAL_COMMUNITY): Payer: Medicare PPO

## 2019-11-19 DIAGNOSIS — E119 Type 2 diabetes mellitus without complications: Secondary | ICD-10-CM | POA: Diagnosis not present

## 2019-11-19 DIAGNOSIS — H04123 Dry eye syndrome of bilateral lacrimal glands: Secondary | ICD-10-CM | POA: Diagnosis not present

## 2019-11-19 DIAGNOSIS — H401133 Primary open-angle glaucoma, bilateral, severe stage: Secondary | ICD-10-CM | POA: Diagnosis not present

## 2019-11-21 MED ORDER — GENTAMICIN SULFATE 40 MG/ML IJ SOLN
5.0000 mg/kg | INTRAVENOUS | Status: AC
  Administered 2019-11-22: 440 mg via INTRAVENOUS
  Filled 2019-11-21: qty 11

## 2019-11-21 MED ORDER — CLINDAMYCIN PHOSPHATE 900 MG/50ML IV SOLN: 900.0000 mg | INTRAVENOUS | Status: AC

## 2019-11-22 ENCOUNTER — Encounter (HOSPITAL_BASED_OUTPATIENT_CLINIC_OR_DEPARTMENT_OTHER)
Admission: RE | Disposition: A | Discharge status: Home / Self Care | Payer: Self-pay | Source: Home / Self Care | Attending: Surgery

## 2019-11-22 ENCOUNTER — Ambulatory Visit (HOSPITAL_BASED_OUTPATIENT_CLINIC_OR_DEPARTMENT_OTHER)
Admission: RE | Admit: 2019-11-22 | Discharge: 2019-11-22 | Disposition: A | Discharge status: Home / Self Care | Payer: Medicare PPO | Attending: Surgery | Admitting: Surgery

## 2019-11-22 ENCOUNTER — Encounter (HOSPITAL_BASED_OUTPATIENT_CLINIC_OR_DEPARTMENT_OTHER): Payer: Self-pay | Admitting: Surgery

## 2019-11-22 ENCOUNTER — Ambulatory Visit (HOSPITAL_BASED_OUTPATIENT_CLINIC_OR_DEPARTMENT_OTHER): Payer: Medicare PPO | Admitting: Certified Registered"

## 2019-11-22 DIAGNOSIS — K589 Irritable bowel syndrome without diarrhea: Secondary | ICD-10-CM | POA: Insufficient documentation

## 2019-11-22 DIAGNOSIS — Z825 Family history of asthma and other chronic lower respiratory diseases: Secondary | ICD-10-CM | POA: Insufficient documentation

## 2019-11-22 DIAGNOSIS — Z885 Allergy status to narcotic agent status: Secondary | ICD-10-CM | POA: Insufficient documentation

## 2019-11-22 DIAGNOSIS — Z800 Family history of malignant neoplasm of digestive organs: Secondary | ICD-10-CM | POA: Insufficient documentation

## 2019-11-22 DIAGNOSIS — Z96653 Presence of artificial knee joint, bilateral: Secondary | ICD-10-CM | POA: Insufficient documentation

## 2019-11-22 DIAGNOSIS — E785 Hyperlipidemia, unspecified: Secondary | ICD-10-CM | POA: Insufficient documentation

## 2019-11-22 DIAGNOSIS — K620 Anal polyp: Secondary | ICD-10-CM | POA: Insufficient documentation

## 2019-11-22 DIAGNOSIS — G609 Hereditary and idiopathic neuropathy, unspecified: Secondary | ICD-10-CM | POA: Diagnosis not present

## 2019-11-22 DIAGNOSIS — Z79899 Other long term (current) drug therapy: Secondary | ICD-10-CM | POA: Diagnosis not present

## 2019-11-22 DIAGNOSIS — G2581 Restless legs syndrome: Secondary | ICD-10-CM | POA: Insufficient documentation

## 2019-11-22 DIAGNOSIS — R519 Headache, unspecified: Secondary | ICD-10-CM | POA: Insufficient documentation

## 2019-11-22 DIAGNOSIS — D649 Anemia, unspecified: Secondary | ICD-10-CM | POA: Diagnosis not present

## 2019-11-22 DIAGNOSIS — E1140 Type 2 diabetes mellitus with diabetic neuropathy, unspecified: Secondary | ICD-10-CM | POA: Insufficient documentation

## 2019-11-22 DIAGNOSIS — E1139 Type 2 diabetes mellitus with other diabetic ophthalmic complication: Secondary | ICD-10-CM | POA: Insufficient documentation

## 2019-11-22 DIAGNOSIS — I10 Essential (primary) hypertension: Secondary | ICD-10-CM | POA: Insufficient documentation

## 2019-11-22 DIAGNOSIS — K644 Residual hemorrhoidal skin tags: Secondary | ICD-10-CM | POA: Diagnosis not present

## 2019-11-22 DIAGNOSIS — Z6834 Body mass index (BMI) 34.0-34.9, adult: Secondary | ICD-10-CM | POA: Diagnosis not present

## 2019-11-22 DIAGNOSIS — M1990 Unspecified osteoarthritis, unspecified site: Secondary | ICD-10-CM | POA: Insufficient documentation

## 2019-11-22 DIAGNOSIS — R0602 Shortness of breath: Secondary | ICD-10-CM | POA: Diagnosis not present

## 2019-11-22 DIAGNOSIS — E119 Type 2 diabetes mellitus without complications: Secondary | ICD-10-CM

## 2019-11-22 DIAGNOSIS — G4700 Insomnia, unspecified: Secondary | ICD-10-CM | POA: Diagnosis not present

## 2019-11-22 DIAGNOSIS — Z7951 Long term (current) use of inhaled steroids: Secondary | ICD-10-CM | POA: Insufficient documentation

## 2019-11-22 DIAGNOSIS — F419 Anxiety disorder, unspecified: Secondary | ICD-10-CM | POA: Diagnosis not present

## 2019-11-22 DIAGNOSIS — J45909 Unspecified asthma, uncomplicated: Secondary | ICD-10-CM | POA: Insufficient documentation

## 2019-11-22 DIAGNOSIS — H42 Glaucoma in diseases classified elsewhere: Secondary | ICD-10-CM | POA: Insufficient documentation

## 2019-11-22 DIAGNOSIS — I700 Atherosclerosis of aorta: Secondary | ICD-10-CM | POA: Insufficient documentation

## 2019-11-22 DIAGNOSIS — Z880 Allergy status to penicillin: Secondary | ICD-10-CM | POA: Insufficient documentation

## 2019-11-22 DIAGNOSIS — K760 Fatty (change of) liver, not elsewhere classified: Secondary | ICD-10-CM | POA: Insufficient documentation

## 2019-11-22 DIAGNOSIS — K604 Rectal fistula: Secondary | ICD-10-CM | POA: Diagnosis not present

## 2019-11-22 DIAGNOSIS — Z853 Personal history of malignant neoplasm of breast: Secondary | ICD-10-CM | POA: Insufficient documentation

## 2019-11-22 DIAGNOSIS — Z881 Allergy status to other antibiotic agents status: Secondary | ICD-10-CM | POA: Insufficient documentation

## 2019-11-22 DIAGNOSIS — Z7984 Long term (current) use of oral hypoglycemic drugs: Secondary | ICD-10-CM | POA: Insufficient documentation

## 2019-11-22 DIAGNOSIS — E1129 Type 2 diabetes mellitus with other diabetic kidney complication: Secondary | ICD-10-CM

## 2019-11-22 DIAGNOSIS — K603 Anal fistula, unspecified: Secondary | ICD-10-CM | POA: Insufficient documentation

## 2019-11-22 DIAGNOSIS — Z87442 Personal history of urinary calculi: Secondary | ICD-10-CM | POA: Insufficient documentation

## 2019-11-22 DIAGNOSIS — Z8261 Family history of arthritis: Secondary | ICD-10-CM | POA: Insufficient documentation

## 2019-11-22 DIAGNOSIS — Z888 Allergy status to other drugs, medicaments and biological substances status: Secondary | ICD-10-CM | POA: Diagnosis not present

## 2019-11-22 DIAGNOSIS — E079 Disorder of thyroid, unspecified: Secondary | ICD-10-CM | POA: Diagnosis not present

## 2019-11-22 DIAGNOSIS — K641 Second degree hemorrhoids: Secondary | ICD-10-CM | POA: Diagnosis not present

## 2019-11-22 DIAGNOSIS — Z86010 Personal history of colonic polyps: Secondary | ICD-10-CM | POA: Insufficient documentation

## 2019-11-22 DIAGNOSIS — Z8349 Family history of other endocrine, nutritional and metabolic diseases: Secondary | ICD-10-CM | POA: Insufficient documentation

## 2019-11-22 DIAGNOSIS — E1165 Type 2 diabetes mellitus with hyperglycemia: Secondary | ICD-10-CM

## 2019-11-22 DIAGNOSIS — Z8249 Family history of ischemic heart disease and other diseases of the circulatory system: Secondary | ICD-10-CM | POA: Insufficient documentation

## 2019-11-22 DIAGNOSIS — E669 Obesity, unspecified: Secondary | ICD-10-CM | POA: Diagnosis present

## 2019-11-22 DIAGNOSIS — M419 Scoliosis, unspecified: Secondary | ICD-10-CM | POA: Insufficient documentation

## 2019-11-22 DIAGNOSIS — K219 Gastro-esophageal reflux disease without esophagitis: Secondary | ICD-10-CM | POA: Insufficient documentation

## 2019-11-22 DIAGNOSIS — H409 Unspecified glaucoma: Secondary | ICD-10-CM | POA: Insufficient documentation

## 2019-11-22 HISTORY — DX: Malignant neoplasm of unspecified site of unspecified female breast: C50.919

## 2019-11-22 HISTORY — DX: Restless legs syndrome: G25.81

## 2019-11-22 HISTORY — PX: HEMORRHOID SURGERY: SHX153

## 2019-11-22 HISTORY — PX: LIGATION OF INTERNAL FISTULA TRACT: SHX6551

## 2019-11-22 HISTORY — DX: Rectal fistula, unspecified: K60.40

## 2019-11-22 HISTORY — DX: Fatty (change of) liver, not elsewhere classified: K76.0

## 2019-11-22 HISTORY — DX: Rectal fistula: K60.4

## 2019-11-22 HISTORY — DX: Personal history of urinary calculi: Z87.442

## 2019-11-22 LAB — POCT I-STAT, CHEM 8
BUN: 8 mg/dL (ref 8–23)
Calcium, Ion: 1.17 mmol/L (ref 1.15–1.40)
Chloride: 96 mmol/L — ABNORMAL LOW (ref 98–111)
Creatinine, Ser: 0.5 mg/dL (ref 0.44–1.00)
Glucose, Bld: 202 mg/dL — ABNORMAL HIGH (ref 70–99)
HCT: 39 % (ref 36.0–46.0)
Hemoglobin: 13.3 g/dL (ref 12.0–15.0)
Potassium: 3.9 mmol/L (ref 3.5–5.1)
Sodium: 139 mmol/L (ref 135–145)
TCO2: 27 mmol/L (ref 22–32)

## 2019-11-22 LAB — GLUCOSE, CAPILLARY: Glucose-Capillary: 212 mg/dL — ABNORMAL HIGH (ref 70–99)

## 2019-11-22 SURGERY — LIGATION, INTERNAL FISTULA TRACT
Anesthesia: General | Site: Rectum

## 2019-11-22 MED ORDER — ONDANSETRON HCL 4 MG/2ML IJ SOLN
INTRAMUSCULAR | Status: AC
  Filled 2019-11-22: qty 2

## 2019-11-22 MED ORDER — HYDROCODONE-ACETAMINOPHEN 10-325 MG PO TABS: 1.0000 | ORAL_TABLET | Freq: Four times a day (QID) | ORAL | 0 refills | Status: DC | PRN

## 2019-11-22 MED ORDER — CLINDAMYCIN PHOSPHATE 900 MG/50ML IV SOLN
900.0000 mg | Freq: Once | INTRAVENOUS | Status: AC
  Administered 2019-11-22: 900 mg via INTRAVENOUS

## 2019-11-22 MED ORDER — DEXAMETHASONE SODIUM PHOSPHATE 10 MG/ML IJ SOLN
INTRAMUSCULAR | Status: AC
  Filled 2019-11-22: qty 1

## 2019-11-22 MED ORDER — ACETAMINOPHEN 500 MG PO TABS
ORAL_TABLET | ORAL | Status: AC
  Filled 2019-11-22: qty 2

## 2019-11-22 MED ORDER — LIDOCAINE 2% (20 MG/ML) 5 ML SYRINGE
INTRAMUSCULAR | Status: AC
  Filled 2019-11-22: qty 5

## 2019-11-22 MED ORDER — LIDOCAINE 2% (20 MG/ML) 5 ML SYRINGE
INTRAMUSCULAR | Status: DC | PRN
  Administered 2019-11-22: 80 mg via INTRAVENOUS

## 2019-11-22 MED ORDER — CHLORHEXIDINE GLUCONATE CLOTH 2 % EX PADS: 6.0000 | MEDICATED_PAD | Freq: Once | CUTANEOUS | Status: DC

## 2019-11-22 MED ORDER — SUGAMMADEX SODIUM 200 MG/2ML IV SOLN
INTRAVENOUS | Status: DC | PRN
  Administered 2019-11-22: 200 mg via INTRAVENOUS

## 2019-11-22 MED ORDER — BUPIVACAINE-EPINEPHRINE 0.25% -1:200000 IJ SOLN
INTRAMUSCULAR | Status: DC | PRN
  Administered 2019-11-22: 20 mL

## 2019-11-22 MED ORDER — BUPIVACAINE LIPOSOME 1.3 % IJ SUSP
INTRAMUSCULAR | Status: DC | PRN
  Administered 2019-11-22: 20 mL

## 2019-11-22 MED ORDER — ACETAMINOPHEN 500 MG PO TABS
1000.0000 mg | ORAL_TABLET | ORAL | Status: AC
  Administered 2019-11-22: 1000 mg via ORAL

## 2019-11-22 MED ORDER — DIBUCAINE (PERIANAL) 1 % EX OINT
TOPICAL_OINTMENT | CUTANEOUS | Status: DC | PRN
  Administered 2019-11-22: 1 via RECTAL

## 2019-11-22 MED ORDER — PHENYLEPHRINE 40 MCG/ML (10ML) SYRINGE FOR IV PUSH (FOR BLOOD PRESSURE SUPPORT)
PREFILLED_SYRINGE | INTRAVENOUS | Status: AC
  Filled 2019-11-22: qty 10

## 2019-11-22 MED ORDER — ROCURONIUM BROMIDE 10 MG/ML (PF) SYRINGE
PREFILLED_SYRINGE | INTRAVENOUS | Status: DC | PRN
  Administered 2019-11-22: 60 mg via INTRAVENOUS

## 2019-11-22 MED ORDER — PHENYLEPHRINE 40 MCG/ML (10ML) SYRINGE FOR IV PUSH (FOR BLOOD PRESSURE SUPPORT)
PREFILLED_SYRINGE | INTRAVENOUS | Status: DC | PRN
  Administered 2019-11-22 (×10): 80 ug via INTRAVENOUS

## 2019-11-22 MED ORDER — EPHEDRINE 5 MG/ML INJ
INTRAVENOUS | Status: AC
  Filled 2019-11-22: qty 10

## 2019-11-22 MED ORDER — ONDANSETRON HCL 4 MG/2ML IJ SOLN
INTRAMUSCULAR | Status: DC | PRN
  Administered 2019-11-22: 4 mg via INTRAVENOUS

## 2019-11-22 MED ORDER — DEXAMETHASONE SODIUM PHOSPHATE 10 MG/ML IJ SOLN
INTRAMUSCULAR | Status: DC | PRN
  Administered 2019-11-22: 10 mg via INTRAVENOUS

## 2019-11-22 MED ORDER — CLINDAMYCIN PHOSPHATE 900 MG/50ML IV SOLN
INTRAVENOUS | Status: AC
  Filled 2019-11-22: qty 50

## 2019-11-22 MED ORDER — LACTATED RINGERS IV SOLN: INTRAVENOUS | Status: DC

## 2019-11-22 MED ORDER — METHYLENE BLUE 0.5 % INJ SOLN
INTRAVENOUS | Status: DC | PRN
  Administered 2019-11-22: 5 mL

## 2019-11-22 MED ORDER — FENTANYL CITRATE (PF) 100 MCG/2ML IJ SOLN
INTRAMUSCULAR | Status: AC
  Filled 2019-11-22: qty 2

## 2019-11-22 MED ORDER — ONDANSETRON HCL 4 MG/2ML IJ SOLN: 4.0000 mg | Freq: Once | INTRAMUSCULAR | Status: DC | PRN

## 2019-11-22 MED ORDER — PROPOFOL 10 MG/ML IV BOLUS
INTRAVENOUS | Status: DC | PRN
  Administered 2019-11-22: 160 mg via INTRAVENOUS

## 2019-11-22 MED ORDER — BUPIVACAINE LIPOSOME 1.3 % IJ SUSP: 20.0000 mL | Freq: Once | INTRAMUSCULAR | Status: DC

## 2019-11-22 MED ORDER — PROPOFOL 10 MG/ML IV BOLUS
INTRAVENOUS | Status: AC
  Filled 2019-11-22: qty 20

## 2019-11-22 MED ORDER — FENTANYL CITRATE (PF) 100 MCG/2ML IJ SOLN
25.0000 ug | INTRAMUSCULAR | Status: DC | PRN
  Administered 2019-11-22: 50 ug via INTRAVENOUS
  Administered 2019-11-22: 25 ug via INTRAVENOUS

## 2019-11-22 MED ORDER — GABAPENTIN 300 MG PO CAPS
ORAL_CAPSULE | ORAL | Status: AC
  Filled 2019-11-22: qty 1

## 2019-11-22 MED ORDER — EPHEDRINE SULFATE-NACL 50-0.9 MG/10ML-% IV SOSY
PREFILLED_SYRINGE | INTRAVENOUS | Status: DC | PRN
  Administered 2019-11-22: 10 mg via INTRAVENOUS

## 2019-11-22 MED ORDER — GABAPENTIN 300 MG PO CAPS
300.0000 mg | ORAL_CAPSULE | ORAL | Status: AC
  Administered 2019-11-22: 300 mg via ORAL

## 2019-11-22 MED ORDER — FENTANYL CITRATE (PF) 100 MCG/2ML IJ SOLN
INTRAMUSCULAR | Status: DC | PRN
  Administered 2019-11-22 (×2): 50 ug via INTRAVENOUS

## 2019-11-22 SURGICAL SUPPLY — 73 items
BENZOIN TINCTURE PRP APPL 2/3 (GAUZE/BANDAGES/DRESSINGS) ×2 IMPLANT
BLADE CLIPPER SENSICLIP SURGIC (BLADE) IMPLANT
BLADE HEX COATED 2.75 (ELECTRODE) ×2 IMPLANT
BLADE SURG 10 STRL SS (BLADE) IMPLANT
BLADE SURG 15 STRL LF DISP TIS (BLADE) ×1 IMPLANT
BLADE SURG 15 STRL SS (BLADE) ×2
BRIEF STRETCH FOR OB PAD LRG (UNDERPADS AND DIAPERS) IMPLANT
BRIEF STRETCH FOR OB PAD XXL (UNDERPADS AND DIAPERS) ×2 IMPLANT
CANISTER SUCT 1200ML W/VALVE (MISCELLANEOUS) ×2 IMPLANT
COVER BACK TABLE 60X90IN (DRAPES) ×2 IMPLANT
COVER MAYO STAND STRL (DRAPES) ×2 IMPLANT
COVER WAND RF STERILE (DRAPES) ×2 IMPLANT
DECANTER SPIKE VIAL GLASS SM (MISCELLANEOUS) ×2 IMPLANT
DRAPE HYSTEROSCOPY (DRAPE) IMPLANT
DRAPE LAPAROTOMY 100X72 PEDS (DRAPES) ×2 IMPLANT
DRAPE SHEET LG 3/4 BI-LAMINATE (DRAPES) IMPLANT
DRSG PAD ABDOMINAL 8X10 ST (GAUZE/BANDAGES/DRESSINGS) ×2 IMPLANT
ELECT NEEDLE TIP 2.8 STRL (NEEDLE) IMPLANT
ELECT REM PT RETURN 9FT ADLT (ELECTROSURGICAL) ×2
ELECTRODE REM PT RTRN 9FT ADLT (ELECTROSURGICAL) ×1 IMPLANT
FILTER STRAW (MISCELLANEOUS) ×4 IMPLANT
GAUZE SPONGE 4X4 12PLY STRL (GAUZE/BANDAGES/DRESSINGS) ×2 IMPLANT
GAUZE SPONGE 4X4 12PLY STRL LF (GAUZE/BANDAGES/DRESSINGS) ×2 IMPLANT
GLOVE BIOGEL PI IND STRL 6.5 (GLOVE) ×1 IMPLANT
GLOVE BIOGEL PI IND STRL 7.0 (GLOVE) ×2 IMPLANT
GLOVE BIOGEL PI IND STRL 7.5 (GLOVE) ×1 IMPLANT
GLOVE BIOGEL PI INDICATOR 6.5 (GLOVE) ×1
GLOVE BIOGEL PI INDICATOR 7.0 (GLOVE) ×2
GLOVE BIOGEL PI INDICATOR 7.5 (GLOVE) ×1
GLOVE ECLIPSE 6.5 STRL STRAW (GLOVE) ×2 IMPLANT
GLOVE ECLIPSE 7.0 STRL STRAW (GLOVE) ×2 IMPLANT
GLOVE ECLIPSE 8.0 STRL XLNG CF (GLOVE) ×2 IMPLANT
GLOVE INDICATOR 8.0 STRL GRN (GLOVE) ×2 IMPLANT
GOWN STRL REUS W/TWL XL LVL3 (GOWN DISPOSABLE) ×2 IMPLANT
IV CATH 14GX2 1/4 (CATHETERS) ×2 IMPLANT
IV CATH PLACEMENT 20 GA (IV SOLUTION) ×2 IMPLANT
KIT SIGMOIDOSCOPE (SET/KITS/TRAYS/PACK) IMPLANT
KIT TURNOVER CYSTO (KITS) ×2 IMPLANT
LEGGING LITHOTOMY PAIR STRL (DRAPES) IMPLANT
NDL SAFETY ECLIPSE 18X1.5 (NEEDLE) IMPLANT
NEEDLE HYPO 18GX1.5 SHARP (NEEDLE)
NEEDLE HYPO 22GX1.5 SAFETY (NEEDLE) ×2 IMPLANT
NS IRRIG 500ML POUR BTL (IV SOLUTION) ×2 IMPLANT
PACK BASIN DAY SURGERY FS (CUSTOM PROCEDURE TRAY) ×2 IMPLANT
PAD PREP 24X48 CUFFED NSTRL (MISCELLANEOUS) ×2 IMPLANT
PENCIL BUTTON HOLSTER BLD 10FT (ELECTRODE) ×2 IMPLANT
SCRUB TECHNI CARE 4 OZ NO DYE (MISCELLANEOUS) ×2 IMPLANT
SHEARS HARMONIC 9CM CVD (BLADE) IMPLANT
SURGILUBE 2OZ TUBE FLIPTOP (MISCELLANEOUS) ×2 IMPLANT
SUT CHROMIC 2 0 SH (SUTURE) ×2 IMPLANT
SUT CHROMIC 3 0 SH 27 (SUTURE) ×2 IMPLANT
SUT MNCRL AB 4-0 PS2 18 (SUTURE) IMPLANT
SUT PROLENE 2 0 SH DA (SUTURE) IMPLANT
SUT VIC AB 2-0 SH 27 (SUTURE)
SUT VIC AB 2-0 SH 27XBRD (SUTURE) IMPLANT
SUT VIC AB 2-0 UR6 27 (SUTURE) ×12 IMPLANT
SUT VIC AB 3-0 SH 18 (SUTURE) IMPLANT
SUT VICRYL 0 UR6 27IN ABS (SUTURE) IMPLANT
SUT VICRYL AB 2 0 TIE (SUTURE) IMPLANT
SUT VICRYL AB 2 0 TIES (SUTURE)
SYR 20ML LL LF (SYRINGE) ×4 IMPLANT
SYR 27GX1/2 1ML LL SAFETY (SYRINGE) ×2 IMPLANT
SYR 5ML LL (SYRINGE) IMPLANT
SYR BULB IRRIG 60ML STRL (SYRINGE) ×2 IMPLANT
SYR CONTROL 10ML LL (SYRINGE) IMPLANT
TAPE CLOTH 3X10 TAN LF (GAUZE/BANDAGES/DRESSINGS) ×2 IMPLANT
TOWEL OR 17X26 10 PK STRL BLUE (TOWEL DISPOSABLE) ×4 IMPLANT
TRAY DSU PREP LF (CUSTOM PROCEDURE TRAY) ×2 IMPLANT
TUBE CONNECTING 12X1/4 (SUCTIONS) ×2 IMPLANT
UNDERPAD 30X30 (UNDERPADS AND DIAPERS) ×2 IMPLANT
UNDERPAD 30X36 HEAVY ABSORB (UNDERPADS AND DIAPERS) ×2 IMPLANT
WATER STERILE IRR 500ML POUR (IV SOLUTION) ×2 IMPLANT
YANKAUER SUCT BULB TIP NO VENT (SUCTIONS) ×4 IMPLANT

## 2019-11-22 NOTE — Discharge Instructions (Signed)
ANORECTAL SURGERY:  POST OPERATIVE INSTRUCTIONS  ######################################################################  EAT Start with a pureed / full liquid diet After 24 hours, gradually transition to a high fiber diet.    CONTROL PAIN Control pain so you can tolerate bowel movements,  walk, sleep, tolerate sneezing/coughing, and go up/down stairs.   HAVE A BOWEL MOVEMENT DAILY Keep your bowels regular to avoid problems.   Taking a fiber supplement every day to keep bowels soft.   Try a laxative to override constipation. Use an antidairrheal to slow down diarrhea.   Call if not better after 2 tries  WALK Walk an hour a day.  Control your pain to do that.   CALL IF YOU HAVE PROBLEMS/CONCERNS Call if you are still struggling despite following these instructions. Call if you have concerns not answered by these instructions  ######################################################################    1. Take your usually prescribed home medications unless otherwise directed.  2. DIET: Follow a light bland diet & liquids the first 24 hours after arrival home, such as soup, liquids, starches, etc.  Be sure to drink plenty of fluids.  Quickly advance to a usual solid diet within a few days.  Avoid fast food or heavy meals as your are more likely to get nauseated or have irregular bowels.  A low-fat, high-fiber diet for the rest of your life is ideal.  3. PAIN CONTROL: a. Pain is best controlled by a usual combination of three different methods TOGETHER: i. Ice/Heat ii. Over the counter pain medication iii. Prescription pain medication b. Expect swelling and discomfort in the anus/rectal area.  Warm water baths (30-60 minutes up to 6 times a day, especially after bowel meovements) will help. Use ice for the first few days to help decrease swelling and bruising, then switch to heat such as warm towels, sitz baths, warm baths, etc to help relax tight/sore spots and speed recovery.   Some people prefer to use ice alone, heat alone, alternating between ice & heat.  Experiment to what works for you.   c. It is helpful to take an over-the-counter pain medication continuously for the first few weeks.  Choose one of the following that works best for you: i. Naproxen (Aleve, etc)  Two 250m tabs twice a day ii. Ibuprofen (Advil, etc) Three 2072mtabs four times a day (every meal & bedtime) iii. Acetaminophen (Tylenol, etc) 500-65044mour times a day (every meal & bedtime) d. A  prescription for pain medication (such as oxycodone, hydrocodone, etc) should be given to you upon discharge.  Take your pain medication as prescribed.  i. If you are having problems/concerns with the prescription medicine (does not control pain, nausea, vomiting, rash, itching, etc), please call us [name] see if we need to switch you to a different pain medicine that will work better for you and/or control your side effect better. ii. If you need a refill on your pain medication, please contact your pharmacy.  They will contact our office to request authorization. Prescriptions will not be filled after 5 pm or on week-ends.  If can take up to 48 hours for it to be filled & ready so avoid waiting until you are down to thel ast pill. e. A topical cream (Dibucaine) or a prescription for a cream (such as diltiazem 2% gel) may be given to you.  Many people find relief with topical creams.  Some people find it burns too much.  Experiment.  If it helps, use it.  If it burns, don't using  it.  Use a Sitz Bath 4-8 times a day for relief   [employer] A sitz bath is a warm water bath taken in the sitting position that covers only the hips and buttocks. It may be used for either healing or hygiene purposes. Sitz baths are also used to relieve pain, itching, or muscle spasms. The water may contain medicine. Moist heat will help you heal and relax.  HOME CARE INSTRUCTIONS  Take 3 to 4 sitz baths a day. 1. Fill the  bathtub half full with warm water. 2. Sit in the water and open the drain a little. 3. Turn on the warm water to keep the tub half full. Keep the water running constantly. 4. Soak in the water for 15 to 20 minutes. 5. After the sitz bath, pat the affected area dry first.   4. KEEP YOUR BOWELS REGULAR a. The goal is one soft bowel movement a day b. Avoid getting constipated.  Between the surgery and the pain medications, it is common to experience some constipation.  Increasing fluid intake and taking a fiber supplement (such as Metamucil, Citrucel, FiberCon, MiraLax, etc) 2-3 times a day regularly will usually help prevent this problem from occurring.  A mild laxative (prune juice, Milk of Magnesia, MiraLax, etc) should be taken according to package directions if there are no bowel movements after 48 hours. c. Watch out for diarrhea.  If you have many loose bowel movements, simplify your diet to bland foods & liquids for a few days.  Stop any stool softeners and decrease your fiber supplement.  Switching to mild anti-diarrheal medications (Kayopectate, Pepto Bismol) can help.  Can try an imodium/loperamide dose.  If this worsens or does not improve, please call us.  5. Wound Care  a. Remove your bandages with your first bowel movement, usually the day after surgery.  You may have packing if you had an abscess.  Let any packing or gauze fall come out.   b. Wear an absorbent pad or soft cotton balls in your underwear as needed to catch any drainage and help keep the area  c. Keep the area clean and dry.  Bathe / shower every day.  Keep the area clean by showering / bathing over the incision / wound.   It is okay to soak an open wound to help wash it.  Consider using a squeeze bottle filled with warm water to gently wash the anal area.  Wet wipes or showers / gentle washing after bowel movements is often less traumatic than regular toilet paper. d. Dennis Bast will often notice bleeding with bowel movements.   This should slow down by the end of the first week of surgery.  Sitting on an ice pack can help. e. Expect some drainage.  This should slow down by the end of the first week of surgery, but you will have occasional bleeding or drainage up to a few months after surgery.  Wear an absorbent pad or soft cotton gauze in your underwear until the drainage stops.  6. ACTIVITIES as tolerated:   a. You may resume regular (light) daily activities beginning the next day--such as daily self-care, walking, climbing stairs--gradually increasing activities as tolerated.  If you can walk 30 minutes without difficulty, it is safe to try more intense activity such as jogging, treadmill, bicycling, low-impact aerobics, swimming, etc. b. Save the most intensive and strenuous activity for last such as sit-ups, heavy lifting, contact sports, etc  Refrain from any heavy lifting or straining  until you are off narcotics for pain control.   c. DO NOT PUSH THROUGH PAIN.  Let pain be your guide: If it hurts to do something, don't do it.  Pain is your body warning you to avoid that activity for another week until the pain goes down. d. You may drive when you are no longer taking prescription pain medication, you can comfortably sit for long periods of time, and you can safely maneuver your car and apply brakes. e. Dennis Bast may have sexual intercourse when it is comfortable.  7. FOLLOW UP in our office a. Please call [facility] at [phone] to set up an appointment to see your surgeon in the office for a follow-up appointment approximately 2-3 weeks after your surgery. b. Make sure that you call for this appointment the day you arrive home to ensure a convenient appointment time.  8. IF YOU HAVE DISABILITY OR FAMILY LEAVE FORMS, BRING THEM TO THE OFFICE FOR PROCESSING.  DO NOT GIVE THEM TO YOUR DOCTOR.        WHEN TO CALL us [phone]: 1. Poor pain control 2. Reactions / problems with new medications (rash/itching, nausea,  etc)  3. Fever over 101.5 F (38.5 C) 4. Inability to urinate 5. Nausea and/or vomiting 6. Worsening swelling or bruising 7. Continued bleeding from incision. 8. Increased pain, redness, or drainage from the incision  The clinic staff is available to answer your questions during regular business hours (8:30am-5pm).  Please don't hesitate to call and ask to speak to one of our nurses for clinical concerns.   A surgeon from [facility] is always on call at the hospitals   If you have a medical emergency, go to the nearest emergency room or call 911.    [facility], [place], [place], [place], [place]  [address] ? MAIN: [phone] ? TOLL FREE: [phone] ? FAX [phone] [email or web address]    Post Anesthesia Home Care Instructions  Activity: Get plenty of rest for the remainder of the day. A responsible individual must stay with you for 24 hours following the procedure.  For the next 24 hours, DO NOT: -Drive a car -Paediatric nurse -Drink alcoholic beverages -Take any medication unless instructed by your physician -Make any legal decisions or sign important papers.  Meals: Start with liquid foods such as gelatin or soup. Progress to regular foods as tolerated. Avoid greasy, spicy, heavy foods. If nausea and/or vomiting occur, drink only clear liquids until the nausea and/or vomiting subsides. Call your physician if vomiting continues.  Special Instructions/Symptoms: Your throat may feel dry or sore from the anesthesia or the breathing tube placed in your throat during surgery. If this causes discomfort, gargle with warm salt water. The discomfort should disappear within 24 hours.   Information for Discharge Teaching: EXPAREL (bupivacaine liposome injectable suspension)   Your surgeon or anesthesiologist gave you EXPAREL(bupivacaine) to help control your pain after surgery.   EXPAREL is a local anesthetic that  provides pain relief by numbing the tissue around the surgical site.  EXPAREL is designed to release pain medication over time and can control pain for up to 72 hours.  Depending on how you respond to EXPAREL, you may require less pain medication during your recovery.  Possible side effects:  Temporary loss of sensation or ability to move in the area where bupivacaine was injected.  Nausea, vomiting, constipation  Rarely, numbness and tingling in your mouth or lips, lightheadedness, or anxiety may occur.  Call your doctor right away if you think you may be experiencing any of these sensations, or if you have other questions regarding possible side effects.  Follow all other discharge instructions given to you by your surgeon or nurse. Eat a healthy diet and drink plenty of water or other fluids.  If you return to the hospital for any reason within 96 hours following the administration of EXPAREL, it is important for health care providers to know that you have received this anesthetic. A teal colored band has been placed on your arm with the date, time and amount of EXPAREL you have received in order to alert and inform your health care providers. Please leave this armband in place for the full 96 hours following administration, and then you may remove the band.

## 2019-11-22 NOTE — Interval H&P Note (Signed)
History and Physical Interval Note:  11/22/2019 11:13 AM  [name]  has presented today for surgery, with the diagnosis of PERIRECTAL FISTULA.  The various methods of treatment have been discussed with the patient and family. After consideration of risks, benefits and other options for treatment, the patient has consented to  Procedure(s): REPAIR OF PERIRECTAL FISTULA, ANORECTAL EXAMINATION UNDER ANESTHESIA (N/A) POSSIBLE HEMORRHOIDECTOMY,POSSIBLE  SETON (N/A) as a surgical intervention.  The patient's history has been reviewed, patient examined, no change in status, stable for surgery.  I have reviewed the patient's chart and labs.  Questions were answered to the patient's satisfaction.    I have re-reviewed the the patient's records, history, medications, and allergies.  I have re-examined the patient.  I again discussed intraoperative plans and goals of post-operative recovery.  The patient agrees to proceed.  [name]  Dec 21, 1940 [id number]  Patient Care Team: Marin Olp, MD as PCP - General (Family Medicine) Michael Boston, MD as Consulting Physician (General Surgery) Mauri Pole, MD as Consulting Physician (Gastroenterology) Jerline Pain, MD as Consulting Physician (Cardiology) Gaynelle Arabian, MD as Consulting Physician (Orthopedic Surgery)  Patient Active Problem List   Diagnosis Date Noted   Healthcare maintenance 02/22/2019   Physical deconditioning 02/22/2019   Shortness of breath 02/22/2019   Adenomatous polyp 12/20/2018   Pain due to onychomycosis of toenails of both feet 11/08/2018   Diabetic neuropathy ([place]) 11/08/2018   History of total knee replacement, bilateral 11/14/2017   Personal history of colonic polyps 05/18/2017   Arthritis of midfoot 03/03/2016   Hammertoes of both feet 03/03/2016   Aortic atherosclerosis ([place]) 02/16/2016   Solitary pulmonary nodule 01/08/2016   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   Bronchiectasis ([place]) 10/18/2015    IBS (irritable bowel syndrome) 07/17/2014   Recurrent major depression in partial remission ([place]) 01/03/2014   Posterior tibial tendon dysfunction 01/03/2014   Osteoarthritis, knee 01/03/2014   Glaucoma 01/03/2014   Hypertension associated with diabetes ([place]) 01/03/2014   Obesity (BMI 30-39.9) 06/15/2013   Primary open-angle glaucoma 08/02/2012   Foot tendinitis 07/20/2010   INSOMNIA, CHRONIC 07/25/2009   Fatty liver 03/18/2009   GERD 12/25/2007   Hyperlipidemia associated with type 2 diabetes mellitus ([place]) 07/26/2007   ANEMIA, B12 DEFICIENCY 12/29/2006   Diabetes mellitus type II, controlled ([place]) 12/28/2006   RESTLESS LEG SYNDROME, MILD 12/28/2006   NEUROPATHY, IDIOPATHIC PERIPHERAL NEC 12/28/2006   Osteoporosis 12/12/2006   BREAST CANCER, HX OF 12/12/2006    Past Medical History:  Diagnosis Date   Allergy    Anemia    Anxiety    Arthritis    right knee;injections every 67month    Asthma    Breast cancer ([facility] 1994/[place]   HX BREAST CANCER/ right brreast and left breast in 1995   Bronchiectasis ([facility] [facility]    COMPRESSION FRACTURE, LUMBAR VERTEBRAE 08/21/2008   Qualifier: Diagnosis of  By: JArnoldo MoraleMD, JBalinda Quails   Depression    Diabetes mellitus    takes Amaryl and Januvia daily   Difficulty sleeping    Diverticulitis    Early cataracts, bilateral    Eczema    Endometriosis    Fatty liver    Gastritis    Glaucoma    Hammer toe    History of kidney stones    Hyperlipidemia associated with type 2 diabetes mellitus ([facility] 07/26/2007   Diet/exercise control.     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   Thyroid disease    Vitamin D deficiency    takes Vit d daily   Weakness    uses a walker and wheelchair    Past Surgical History:  Procedure Laterality Date   ADENOIDECTOMY     at age 55   BIOPSY  12/19/2018   Procedure: BIOPSY;  Surgeon: Mauri Pole, MD;  [facility]: [facility];  Service: Endoscopy;;   CATARACT EXTRACTION     CHOLECYSTECTOMY  06/2008   COLONOSCOPY WITH PROPOFOL N/A 05/17/2017   Procedure: COLONOSCOPY WITH PROPOFOL;  Surgeon: Mauri Pole, MD;  [facility];  Service: Endoscopy;  Laterality: N/A;  PT WILL BE ADMITTED THE DAY BEFORE FOR PREP PER ROBIN KB   COLONOSCOPY WITH PROPOFOL N/A 12/19/2018   Procedure: COLONOSCOPY WITH PROPOFOL;  Surgeon: Mauri Pole, MD;  [facility];  Service: Endoscopy;  Laterality: N/A;   DILATION AND CURETTAGE OF UTERUS     excision on breast     internal infected suture from breast surgery   excision removed from neck     infected lymph node   FOOT SURGERY     left foot-cleaning out areas on toes at the wound center-having MRI to determine if osteomyelitis   HYPERBARIC OXYGEN THERAPY     FEET INFECTION   HYSTEROSCOPY  06/22/11   PMB submucosal myoma   JOINT REPLACEMENT     left total shoulder   LAPAROSCOPIC OOPHORECTOMY Right 12/2004   absent LSO   LAPAROTOMY     MASTECTOMY Bilateral 1994 right, 1995 left   POLYPECTOMY  12/19/2018   Procedure: POLYPECTOMY;  Surgeon: Mauri Pole, MD;  [facility];  Service: Endoscopy;;   SHOULDER HEMI-ARTHROPLASTY  01/01/2012   Procedure: SHOULDER HEMI-ARTHROPLASTY;  Surgeon: Roseanne Kaufman, MD;  Location: [facility];  Service: Orthopedics;  Laterality: Left;  Left Shoulder Hemi Arthroplasty with Repair and  Reconstruction as Necessary    THYROIDECTOMY     85yr ago. follows endocrine   TONSILLECTOMY     TOTAL KNEE ARTHROPLASTY Right 12/30/2014   Procedure: RIGHT TOTAL KNEE ARTHROPLASTY;  Surgeon: FGaynelle Arabian MD;  [facility];  Service: Orthopedics;  Laterality: Right;   TOTAL KNEE ARTHROPLASTY Left 11/29/2016   Procedure: LEFT TOTAL KNEE ARTHROPLASTY;  Surgeon: AGaynelle Arabian MD;  Location[facility];  Service: Orthopedics;  Laterality: Left;    Social History   Socioeconomic History   Marital status: Married    Spouse name: Not on file   Number of children: 1   Years of education: Not on file   Highest education level: Not on file  Occupational History   Occupation: retired  Tobacco Use   Smoking status: Never Smoker   Smokeless tobacco: Never Used  VScientific laboratory technicianUse: Never used  Substance and Sexual Activity   Alcohol use: No   Drug use: No   Sexual activity: Not Currently    Partners: Male    Birth control/protection: Post-menopausal, Surgical    Comment: partial hysterectomy  Other Topics Concern   Not on file  Social History Narrative   Married 39 years in 2015. 1 adopted son. 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, [employer], sewing, decorate   Social Determinants of Radio broadcast assistant Strain:    Difficulty of Paying Living Expenses:   Food Insecurity:    Worried About Charity fundraiser in the Last Year:    Arboriculturist in the Last Year:   Transportation Needs:    Film/video editor (Medical):    Lack of Transportation (Non-Medical):   Physical Activity:    Days of Exercise per Week:    Minutes of Exercise per Session:   Stress:    Feeling of Stress :   Social Connections:    Frequency of Communication with Friends and Family:    Frequency of Social Gatherings with Friends and Family:    Attends Religious Services:    Active Member of Clubs or Organizations:    Attends Programme researcher, broadcasting/film/video:    Marital Status:   Intimate Partner Violence:    Fear of Current or Ex-Partner:    Emotionally Abused:    Physically Abused:    Sexually Abused:     Family History  Problem Relation Age of Onset   Arthritis Mother    COPD Father    Heart disease Father        MI 96 - states he died of lockjaw   Hypertension Father    Hyperlipidemia Father    Pancreatic cancer Paternal Grandfather    Colon cancer Neg Hx    Esophageal cancer Neg Hx    Rectal cancer Neg Hx    Stomach cancer Neg Hx     Medications Prior to Admission  Medication Sig Dispense Refill Last Dose   atenolol (TENORMIN) 25 MG tablet Take 1 tablet by mouth once daily (Patient taking differently: every morning. ) 90 tablet 0    Bacillus Coagulans-Inulin (ALIGN PREBIOTIC-PROBIOTIC PO) Take 1 capsule by mouth daily.       blood glucose meter kit and supplies KIT Dispense based on patient and insurance preference. Use up to four times daily as directed. Dx E11.9 1 each 0    cetirizine (ZYRTEC) 10 MG tablet Take 10 mg by mouth every morning.       Cholecalciferol (VITAMIN D3) 125 MCG (5000 UT) TABS Take 5,000 Units by mouth daily.      Coenzyme Q10 (COQ10) 200 MG CAPS Take 200 mg by mouth daily.      colesevelam (WELCHOL) 625 MG tablet Take 1 tablet (625 mg total) by mouth in the morning, at noon, and at bedtime. (Patient taking differently: Take 625 mg by mouth 2 (two) times daily with a meal. ) 90 tablet 3    dicyclomine (BENTYL) 10 MG capsule TAKE 2 CAPSULES BY MOUTH 4 TIMES DAILY AS NEEDED (Patient taking differently: 3 (three) times daily before meals. ) 120 capsule 0    dorzolamide-timolol (COSOPT) 22.3-6.8 MG/ML ophthalmic solution Place 1 drop into both eyes 2 (two) times daily.       ESTRING 2 MG vaginal ring INSERT 1 RING  INTO VAGINA EVERY  3 MONTHS 1 each 3    FLUoxetine (PROZAC) 40 MG capsule Take 1 capsule by mouth once daily 90 capsule 0    fluticasone (FLONASE) 50 MCG/ACT nasal spray Place 2  sprays into both nostrils daily. (Patient taking differently: Place 2 sprays into both nostrils every morning. ) 16 g 11    glimepiride (AMARYL) 4 MG tablet Take 2 tablets (8 mg total) by mouth daily with breakfast. 180 tablet 3    glucose blood (ACCU-CHEK GUIDE) test strip Use to test blood sugars daily. Dx: E11.9 100 each 6    glucose blood test strip Use to test up to twice a day 200 each 1    hydrocortisone 2.5 % cream Apply 1 application topically 2 (two) times daily as needed (skin irritation/rash).       ketoconazole (NIZORAL) 2 % cream as needed.       Lancets Misc. (ACCU-CHEK FASTCLIX LANCET) KIT Use to test blood sugars daily. Dx: E11.9 1 kit 5    latanoprost (XALATAN) 0.005 % ophthalmic solution Place 1 drop into both eyes at bedtime.      lisinopril (ZESTRIL) 10 MG tablet Take 1 tablet (10 mg total) by mouth daily. 90 tablet 0    lovastatin (MEVACOR) 10 MG tablet Take 1 tablet (10 mg total) by mouth every other day. 45 tablet 3    Methylcellulose, Laxative, (CITRUCEL PO) Take 3 tablets by mouth at bedtime.      Multiple Vitamin (MULTIVITAMIN WITH MINERALS) TABS tablet Take 1 tablet by mouth daily. Centrum Silver      pramipexole (MIRAPEX) 0.5 MG tablet TAKE 1 TABLET BY MOUTH AT BEDTIME 90 tablet 0    Respiratory Therapy Supplies (FLUTTER) DEVI Use as directed 1 each 0    Selenium 200 MCG CAPS Take 200 mcg by mouth at bedtime.       SYMBICORT 160-4.5 MCG/ACT inhaler INHALE 2 PUFFS BY MOUTH TWICE DAILY . 10.2 g 11    temazepam (RESTORIL) 15 MG capsule TAKE 1 CAPSULE BY MOUTH AT BEDTIME AS NEEDED FOR  SLEEP 90 capsule 5    VICTOZA 18 MG/3ML SOPN INJECT 1.8MG INTO THE SKIN ONCE DAILY 9 mL 0    vitamin B-12 (CYANOCOBALAMIN) 1000 MCG tablet Take 1,000 mcg by mouth daily.       Current Facility-Administered Medications  Medication Dose Route Frequency Provider Last Rate Last Admin   acetaminophen (TYLENOL) tablet 1,000 mg  1,000 mg Oral On Call to OR Michael Boston, MD       bupivacaine  liposome (EXPAREL) 1.3 % injection 266 mg  20 mL Infiltration Once Michael Boston, MD       Chlorhexidine Gluconate Cloth 2 % PADS 6 each  6 each Topical Once Michael Boston, MD       And   Chlorhexidine Gluconate Cloth 2 % PADS 6 each  6 each Topical Once Michael Boston, MD       gabapentin (NEURONTIN) capsule 300 mg  300 mg Oral On Call to OR Michael Boston, MD       gentamicin (GARAMYCIN) 440 mg in dextrose 5 % 100 mL IVPB  5 mg/kg (Adjusted) Intravenous On Call to OR Michael Boston, MD       lactated ringers infusion   Intravenous Continuous Lyn Hollingshead, MD         Allergies  Allergen Reactions   Nitrofurantoin Other (See Comments)    Severe headache HEADACHE   Levaquin [Levofloxacin In D5w] Other (See Comments)    Pain  in tendons    Brimonidine Other (See Comments)    Made eyes and surrounding areas RED/ was an eye drop   Cephalexin Diarrhea and Other (See Comments)    Patient can't remember reaction (per chart at [facility] states diarrhea) (tolerates Augmentin fine)   Erythromycin Ethylsuccinate Hives and Diarrhea   Oxycodone Itching    Ht 6' (1.829 m)    Wt 111.1 kg    LMP 05/10/1992 Comment: partial   BMI 33.23 kg/m   Labs: No results found for this or any previous visit (from the past 48 hour(s)).  Imaging / Studies: No results found.   Adin Hector, M.D., F.A.C.S. Gastrointestinal and Minimally Invasive Surgery [facility] [address], [place], [place] [address] [phone] Main / Paging  11/22/2019 11:13 AM    Adin Hector

## 2019-11-22 NOTE — H&P (Signed)
[name] DOB: 1940-10-16 Married / Language: Cleophus Molt / Race: White Female  Patient Care Team: Marin Olp, MD as PCP - General (Family Medicine) Michael Boston, MD as Consulting Physician (General Surgery) Mauri Pole, MD as Consulting Physician (Gastroenterology) Jerline Pain, MD as Consulting Physician (Cardiology) Gaynelle Arabian, MD as Consulting Physician (Orthopedic Surgery)  Patient sent for surgical consultation at the request of Carlena Hurl  Chief Complaint: Persistent/recurrent pain s/p I&D anal abscess ` ` The patient is a pleasant obese woman struggling with recurrent perianal pain and swelling. She underwent incision and drainage for an abscess in urgent clinic. Seemed to improve but then had recurrent symptoms. Received another course of Augmentin antibiotics. She can get yeast infections on oral antibiotics. Improved with Diflucan. It seemed to calm down but never really go away. She began having worsening pain and swelling and wished to be seen again. She felt worsening pain and swelling and finally something started drain in the sitz bath yesterday. Feeling better but still comfortable. Husband is here today. She takes Citrucel for many years. Followed by [facility] gastroneurology for irritable bowel. Usually moves her bowels once or twice a day. No history of prior anorectal issues or hemorrhoids. She does not smoke. She is a diabetic. A1c is around 8. Not insulin requiring. She underwent knee surgery 3 years ago without difficulty and had a normal stress test in 2018 followed by Dr. Marlou Porch with the [facility].   No personal nor family history of GI/colon cancer, inflammatory bowel disease, allergy such as Celiac Sprue, dietary/dairy problems, colitis, ulcers nor gastritis. No recent sick contacts/gastroenteritis. No travel outside the country. No changes in diet. No dysphagia to solids or liquids. No  significant heartburn or reflux. No hematochezia, hematemesis, coffee ground emesis. No evidence of prior gastric/peptic ulceration.  (Review of systems as stated in this history (HPI) or in the review of systems. Otherwise all other 12 point ROS are negative) ` ` `  This patient encounter took 40 minutes today to perform the following: obtain history, perform exam, review outside records, interpret tests & imaging, counsel the patient on their diagnosis; and, document this encounter, including findings & plan in the electronic health record (EHR).   Allergies Sabino Gasser, CMA; 09/11/2019 12:03 PM) Cephalexin *CEPHALOSPORINS* Erythromycin *MACROLIDES* LevoFLOXacin *CHEMICALS* Nitrofurantoin *URINARY ANTI-INFECTIVES* OxyCODONE HCl (Abuse Deter) *ANALGESICS - OPIOID* Allergies Reconciled  Medication History Sabino Gasser, CMA; 09/11/2019 12:03 PM) Amoxicillin-Pot Clavulanate (875-125MG  Tablet, Oral) Active. Symbicort (160-4.5MCG/ACT Aerosol, Inhalation) Active. Atenolol (25MG  Tablet, Oral) Active. Durezol (0.05% Emulsion, Ophthalmic) Active. Glimepiride (4MG  Tablet, Oral) Active. FLUoxetine HCl (40MG  Capsule, Oral) Active. Meloxicam (15MG  Tablet, Oral) Active. ProAir HFA (108 (90 Base)MCG/ACT Aerosol Soln, Inhalation) Active. Victoza (18MG /3ML Soln Pen-inj, Subcutaneous) Active. Lisinopril (10MG  Tablet, Oral) Active. Pramipexole Dihydrochloride (0.5MG  Tablet, Oral) Active. Terconazole (0.4% Cream, Vaginal) Active. Medications Reconciled    Vitals Sabino Gasser CMA; 09/11/2019 12:04 PM) 09/11/2019 12:03 PM Height: 72in Weight Measurement Declined Temp.: 88F(Tympanic)  Pulse: 87 (Regular)  BP: 128/72(Sitting, Left Arm, Standard)        Physical Exam Adin Hector MD; 09/11/2019 12:59 PM)  General Mental Status-Alert. General Appearance-Not in acute distress, Not Sickly. Orientation-Oriented X3. Hydration-Well  hydrated. Voice-Normal. Note: Tends to use wheelchair. Can transfer to bed with one person assist. Bright and alert.  Integumentary Global Assessment Upon inspection and palpation of skin surfaces of the - Axillae: non-tender, no inflammation or ulceration, no drainage. and Distribution of scalp and body hair is normal.  General Characteristics Temperature - normal warmth is noted.  Head and Neck Head-normocephalic, atraumatic with no lesions or palpable masses. Face Global Assessment - atraumatic, no absence of expression. Neck Global Assessment - no abnormal movements, no bruit auscultated on the right, no bruit auscultated on the left, no decreased range of motion, non-tender. Trachea-midline. Thyroid Gland Characteristics - non-tender.  Eye Eyeball - Left-Extraocular movements intact, No Nystagmus - Left. Eyeball - Right-Extraocular movements intact, No Nystagmus - Right. Cornea - Left-No Hazy - Left. Cornea - Right-No Hazy - Right. Sclera/Conjunctiva - Left-No scleral icterus, No Discharge - Left. Sclera/Conjunctiva - Right-No scleral icterus, No Discharge - Right. Pupil - Left-Direct reaction to light normal. Pupil - Right-Direct reaction to light normal.  ENMT Ears Pinna - Left - no drainage observed, no generalized tenderness observed. Pinna - Right - no drainage observed, no generalized tenderness observed. Nose and Sinuses External Inspection of the Nose - no destructive lesion observed. Inspection of the nares - Left - quiet respiration. Inspection of the nares - Right - quiet respiration. Mouth and Throat Lips - Upper Lip - no fissures observed, no pallor noted. Lower Lip - no fissures observed, no pallor noted. Nasopharynx - no discharge present. Oral Cavity/Oropharynx - Tongue - no dryness observed. Oral Mucosa - no cyanosis observed. Hypopharynx - no evidence of airway distress observed.  Chest and Lung Exam Inspection Movements -  Normal and Symmetrical. Accessory muscles - No use of accessory muscles in breathing. Palpation Palpation of the chest reveals - Non-tender. Auscultation Breath sounds - Normal and Clear.  Cardiovascular Auscultation Rhythm - Regular. Murmurs & Other Heart Sounds - Auscultation of the heart reveals - No Murmurs and No Systolic Clicks.  Abdomen Inspection Inspection of the abdomen reveals - No Visible peristalsis and No Abnormal pulsations. Umbilicus - No Bleeding, No Urine drainage. Palpation/Percussion Palpation and Percussion of the abdomen reveal - Soft, Non Tender, No Rebound tenderness, No Rigidity (guarding) and No Cutaneous hyperesthesia. Note: Abdomen soft. Not severely distended. No distasis recti. No umbilical or other anterior abdominal wall hernias  Female Genitourinary Sexual Maturity Tanner 5 - Adult hair pattern. Note: No vaginal bleeding nor discharge  Rectal Note: Left anterior perianal swelling consistent with recurrent draining abscess. Purulent drainage 15 mm from anal verge. Erythema heading to base of left labia about 5 cm  . No fissure. No other abscesses. Minimal external hemorrhoids. Sphincter tone normal. I held off on internal exam.  Peripheral Vascular Upper Extremity Inspection - Left - No Cyanotic nailbeds - Left, Not Ischemic. Inspection - Right - No Cyanotic nailbeds - Right, Not Ischemic.  Neurologic Neurologic evaluation reveals -normal attention span and ability to concentrate, able to name objects and repeat phrases. Appropriate fund of knowledge , normal sensation and normal coordination. Mental Status Affect - not angry, not paranoid. Cranial Nerves-Normal Bilaterally. Gait-Normal.  Neuropsychiatric Mental status exam performed with findings of-able to articulate well with normal speech/language, rate, volume and coherence, thought content normal with ability to perform basic computations and apply abstract reasoning  and no evidence of hallucinations, delusions, obsessions or homicidal/suicidal ideation.  Musculoskeletal Global Assessment Spine, Ribs and Pelvis - no instability, subluxation or laxity. Right Upper Extremity - no instability, subluxation or laxity.  Lymphatic Head & Neck  General Head & Neck Lymphatics: Bilateral - Description - No Localized lymphadenopathy. Axillary  General Axillary Region: Bilateral - Description - No Localized lymphadenopathy. Femoral & Inguinal  Generalized Femoral & Inguinal Lymphatics: Left - Description - No Localized lymphadenopathy. Right -  Description - No Localized lymphadenopathy.    Assessment & Plan     ANAL FISTULA (K60.3) Impression: Recurrent perirectal abscesses in the same location highly suspicious for fistula.  I think she would benefit from examination under anesthesia. Possible fistula repair. Possible fistulotomy versus seton placement.   She is very motivated to consider surgery.  While she has some limited mobility, she had good cardiac function a few years ago on a stress test followed by Dr. Marlou Porch. Tolerated her knee replacement without any new issues. I don't think we need to repeat that since she has otherwise been stable  Current Plans Pt Education - [facility] Abscess/Fistula (AT): discussed with patient and provided information. The anatomy & physiology of the anorectal region was discussed. We discussed the pathophysiology of anorectal abscess and fistula. Differential diagnosis was discussed. Natural history progression was discussed. I stressed the importance of a bowel regimen to have daily soft bowel movements to minimize progression of disease.  The patient's condition is not adequately controlled. Non-operative treatment has not healed the fistula. Therefore, I recommended examination under anaesthesia to confirm the diagnosis and treat the fistula. I discussed techniques that may be required such as  fistulotomy, ligation by LIFT technique, and/or seton placement. Benefits & alternatives discussed. I noted a good likelihood this will help address the problem, but sometimes repeat operations and prolonged healing times may occur. Risks such as bleeding, pain, recurrence, reoperation, incontinence, heart attack, death, and other risks were discussed.  Educational handouts further explaining the pathology, treatment options, and bowel regimen were given. The patient expressed understanding & wishes to proceed. We will work to coordinate surgery for a mutually convenient time.  Pt Education - [facility] Rectal Surgery HCI (Janie Strothman): discussed with patient and provided information.     Adin Hector, MD, FACS, MASCRS Gastrointestinal and Minimally Invasive Surgery  [facility] [address] [place], [place] [address] [phone] Fax [phone] Main/Paging  CONTACT INFORMATION: Weekday (9AM-5PM) concerns: Call [facility] main office at [phone] Weeknight (5PM-9AM) or Weekend/Holiday concerns: Check [email or web address] for General Surgery [facility] coverage (Please, do not use SecureChat as it is not reliable communication to surgeons for patient care)

## 2019-11-22 NOTE — Op Note (Signed)
11/22/2019  1:36 PM  PATIENT:  [name]  79 y.o. female  Patient Care Team: Marin Olp, MD as PCP - General (Family Medicine) Michael Boston, MD as Consulting Physician (General Surgery) Mauri Pole, MD as Consulting Physician (Gastroenterology) Jerline Pain, MD as Consulting Physician (Cardiology) Gaynelle Arabian, MD as Consulting Physician (Orthopedic Surgery)  PRE-OPERATIVE DIAGNOSIS:   PERIRECTAL FISTULA EXTERNAL HEMORRHOIDS WITH IRRITATION  POST-OPERATIVE DIAGNOSIS:  INTERSPHINCTERIC PERIRECTAL FISTULA EXTERNAL HEMORRHOIDS WITH IRRITATION PROLAPSING ANAL POLYP  PROCEDURE:  LIFT REPAIR OF PERIRECTAL FISTULA HEMORRHOIDECTOMY EXCISION OF ANAL POLYP HEMORRHOID LIGATION & PEXY ANORECTAL EXAMINATION UNDER ANESTHESIA  SURGEON:  Adin Hector, MD  ASSISTANT: OR Staff   ANESTHESIA:   General Anorectal & Local field block (0.25% bupivacaine with epinephrine mixed with Liposomal bupivacaine (Experel)    EBL:  Total I/O In: 0932 [I.V.:1000; IV Piggyback:111] Out: - .  See anesthesia record  Delay start of Pharmacological VTE agent (>24hrs) due to surgical blood loss or risk of bleeding:  no  DRAINS: none   SPECIMENS:   External component of intersphincteric fistulous tract. External hemorrhoids. Prolapsing anal canal polyp  DISPOSITION OF SPECIMEN:  PATHOLOGY  COUNTS:  YES  PLAN OF CARE: Discharge to home after PACU  PATIENT DISPOSITION:  PACU - hemodynamically stable.  INDICATION: Patient with probable perirectal fistula.  I recommended examination and surgical treatment:  The anatomy & physiology of the anorectal region was discussed.  We discussed the pathophysiology of anorectal abscess and fistula.  Differential diagnosis was discussed.  Natural history progression was discussed.   I stressed the importance of a bowel regimen to have daily soft bowel movements to minimize progression of disease.     The patient's condition is not  adequately controlled.  Non-operative treatment has not healed the fistula.  Therefore, I recommended examination under anaesthesia to confirm the diagnosis and treat the fistula.  I discussed techniques that may be required such as fistulotomy, ligation by LIFT technique, and/or seton placement.  Benefits & alternatives discussed.  I noted a good likelihood this will help address the problem, but sometimes repeat operations and prolonged healing times may occur.  Risks such as bleeding, pain, recurrence, reoperation, incontinence, heart attack, death, and other risks were discussed.      Educational handouts further explaining the pathology, treatment options, and bowel regimen were given.  The patient expressed understanding & wishes to proceed.  We will work to coordinate surgery for a mutually convenient time.   OR FINDINGS: Patient had an intersphincteric fistula.    External location LEFT ANTERIOR   about 4 cm from anal verge.  Internal location : Anterior midline at anal crypt about 1 cm from anal verge.  Right anterior external hemorrhoidal tissue.  Prolapsing right lateral anal canal polyp. Grade 2 internal hemorrhoids x3 with some irritation especially right anterior & right posterior greater than left lateral  DESCRIPTION:   Informed consent was confirmed. Patient underwent general anesthesia without difficulty. Patient was placed into prone positioning.  Care was made for safe positioning given her prior shoulder and knee surgeries the perianal region was prepped and draped in sterile fashion. Surgical timeout confirmed or plan.  I did digital rectal examination and then transitioned over to anoscopy to get a sense of the anatomy.  I did place a probe through the external opening.    I also injected the track with methylene blue.  With this I was able to locate an internal opening.  The tract did not  feel superficial, concerning for a probable intersphincteric fistula.  No abscess  located.  I went ahead and proceeded with the LIFT technique.  I began to excise the external opening with a radial biconcave incision around it.  I transitioned to cautery and help free the fistulous tract circumferentially all way down towards the sphincter component.    I made an incision at the anal squamocolumnar junction .  Did careful dissection to get down to the sphincter complex.  I carefully went between the internal and external sphincter using careful blunt dissection parallel to the fibers.  I was able to locate the intersphincteric component of the fistulous tract.  I was able to get around it gently with a right angle clamp.  I carefully skeletonized the intersphincteric component. I placed 2-0 Vicryl stitches through the intersphincteric tract on the proximal side and on the distal side in between the external & internal sphincters.  I transected the intersphincteric segment of the fistulous tract.   I ligated the stumps of the transected segments with 2-0 Vicryl again with a figure-of-eight stitch in a 90 degree fashion to doubly ligate and prove that the tract had been closed.   I then transitioned to the rectal component.  Did a figure-of-eight stitch of 2-0 Vicryl suture several centimeters proximal to the internal opening in the rectum along the left anterior hemorrhoidal column.  I ran that longitudinally until it came to the opening.  I transected the rectal tissue anorectal component and a longitudinal fusiform fashion until I had healthier tissue.  I then ran the 2-0 Vicryl stitch down to the anal verge to also help cover up the intersphincteric ligation wound as well.  I tied that running suture down, thus closing the internal opening and protecting the LIFT repair.    I removed the superficial external end of the fistulous tract & ligated the base of the external wound just outside the sphincter component with 2-0 vicryl.  Excised some skin and dermis to have a broad flat wound.   I also did internal hemorrhoidal ligation on the other 5 hemorrhoidal columns using 2-0 Vicryl in a running suture 6 cm proximal anal verge in the running down longitudinal and tying down for pexy.  I had excised a right lateral prolapsing anal canal polyp as well.  Trimmed off some persistent right lateral and right anterior external hemorrhoidal tissue.  Close the right lateral wound with chromic sutures horizontal mattress interrupted.  Right anterior was more transverse I closed that with some interrupted horizontal mattress sutures transversely at the anal verge for good result. Hemostasis was excellent.  I reexamined the anal canal.   There is was no narrowing.  Hemostasis was excellent.  I repeated anoscopy and examination.  Hemostasis was good.  We placed fluff gauze to onlay over the wounds.  No packing done.  Patient is being extubated go to recovery room.  I discussed operative findings, updated the patient's status, discussed probable steps to recovery, and gave postoperative recommendations to the patient's spouse.  Recommendations were made.  Questions were answered.  He expressed understanding & appreciation.     Adin Hector, M.D., F.A.C.S. Gastrointestinal and Minimally Invasive Surgery [facility] [address] [place], [place] [address] [phone] Main / Paging

## 2019-11-22 NOTE — Anesthesia Procedure Notes (Signed)
Procedure Name: Intubation Date/Time: 11/22/2019 11:59 AM Performed by: Ronesha Heenan D, CRNA Pre-anesthesia Checklist: Patient identified, Emergency Drugs available, Suction available and Patient being monitored Patient Re-evaluated:Patient Re-evaluated prior to induction Oxygen Delivery Method: Circle system utilized Preoxygenation: Pre-oxygenation with 100% oxygen Induction Type: IV induction Ventilation: Mask ventilation without difficulty Laryngoscope Size: Mac and 3 Grade View: Grade I Tube type: Oral Tube size: 7.0 mm Number of attempts: 1 Airway Equipment and Method: Stylet Placement Confirmation: ETT inserted through vocal cords under direct vision,  positive ETCO2 and breath sounds checked- equal and bilateral Secured at: 21 cm Tube secured with: Tape Dental Injury: Teeth and Oropharynx as per pre-operative assessment

## 2019-11-22 NOTE — Transfer of Care (Signed)
Immediate Anesthesia Transfer of Care Note  Patient: [name]  Procedure(s) Performed: REPAIR OF PERIRECTAL FISTULA, ANORECTAL EXAMINATION UNDER ANESTHESIA (N/A Rectum) HEMORRHOIDECTOMY LIGATION , PEXY (N/A Rectum)  Patient Location: PACU  Anesthesia Type:General  Level of Consciousness: awake, alert  and oriented  Airway & Oxygen Therapy: Patient Spontanous Breathing and Patient connected to nasal cannula oxygen  Post-op Assessment: Report given to RN and Post -op Vital signs reviewed and stable  Post vital signs: Reviewed and stable  Last Vitals:  Vitals Value Taken Time  BP 140/63 11/22/19 1350  Temp    Pulse 78 11/22/19 1355  Resp 18 11/22/19 1355  SpO2 98 % 11/22/19 1355  Vitals shown include unvalidated device data.  Last Pain:  Vitals:   11/22/19 1126  TempSrc: Oral  PainSc: 0-No pain      Patients Stated Pain Goal: 4 (09/32/67 1245)  Complications: No complications documented.

## 2019-11-22 NOTE — Anesthesia Preprocedure Evaluation (Signed)
Anesthesia Evaluation  Patient identified by MRN, date of birth, ID band Patient awake    Reviewed: Allergy & Precautions, NPO status , Patient's Chart, lab work & pertinent test results, reviewed documented beta blocker date and time   History of Anesthesia Complications (+) PONV and history of anesthetic complications  Airway Mallampati: III  TM Distance: >3 FB Neck ROM: Full    Dental no notable dental hx. (+) Teeth Intact, Chipped,    Pulmonary shortness of breath, asthma , pneumonia,    Pulmonary exam normal breath sounds clear to auscultation       Cardiovascular hypertension, Pt. on medications and Pt. on home beta blockers Normal cardiovascular exam Rhythm:Regular Rate:Normal     Neuro/Psych  Headaches, PSYCHIATRIC DISORDERS Anxiety Depression    GI/Hepatic GERD  ,  Endo/Other  diabetes, Type 2Morbid obesity  Renal/GU      Musculoskeletal  (+) Arthritis ,   Abdominal (+) + obese,   Peds  Hematology  (+) Blood dyscrasia, anemia ,   Anesthesia Other Findings   Reproductive/Obstetrics                             Anesthesia Physical  Anesthesia Plan  ASA: III  Anesthesia Plan: General   Post-op Pain Management:    Induction: Intravenous  PONV Risk Score and Plan: 4 or greater and Treatment may vary due to age or medical condition, Ondansetron and Dexamethasone  Airway Management Planned: Oral ETT  Additional Equipment: None  Intra-op Plan:   Post-operative Plan: Extubation in OR  Informed Consent: I have reviewed the patients History and Physical, chart, labs and discussed the procedure including the risks, benefits and alternatives for the proposed anesthesia with the patient or authorized representative who has indicated his/her understanding and acceptance.     Dental advisory given  Plan Discussed with: CRNA  Anesthesia Plan Comments:         Anesthesia  Quick Evaluation

## 2019-11-23 ENCOUNTER — Encounter (HOSPITAL_BASED_OUTPATIENT_CLINIC_OR_DEPARTMENT_OTHER): Payer: Self-pay | Admitting: Surgery

## 2019-11-23 LAB — SURGICAL PATHOLOGY

## 2019-11-24 NOTE — Anesthesia Postprocedure Evaluation (Signed)
Anesthesia Post Note  Patient: [name]  Procedure(s) Performed: REPAIR OF PERIRECTAL FISTULA, ANORECTAL EXAMINATION UNDER ANESTHESIA (N/A Rectum) HEMORRHOIDECTOMY LIGATION , PEXY (N/A Rectum)     Patient location during evaluation: PACU Anesthesia Type: General Level of consciousness: sedated and patient cooperative Pain management: pain level controlled Vital Signs Assessment: post-procedure vital signs reviewed and stable Respiratory status: spontaneous breathing Cardiovascular status: stable Anesthetic complications: no   No complications documented.  Last Vitals:  Vitals:   11/22/19 1500 11/22/19 1658  BP: (!) 107/46 (!) 118/47  Pulse: 78 75  Resp: 14 16  Temp:  37.3 C  SpO2: 94% 92%    Last Pain:  Vitals:   11/23/19 0947  TempSrc:   PainSc: [place]

## 2019-11-28 ENCOUNTER — Other Ambulatory Visit: Payer: Self-pay | Admitting: Physician Assistant

## 2019-11-28 ENCOUNTER — Other Ambulatory Visit: Payer: Self-pay | Admitting: Family Medicine

## 2019-12-18 DIAGNOSIS — L97512 Non-pressure chronic ulcer of other part of right foot with fat layer exposed: Secondary | ICD-10-CM | POA: Diagnosis not present

## 2019-12-18 DIAGNOSIS — L603 Nail dystrophy: Secondary | ICD-10-CM | POA: Diagnosis not present

## 2019-12-18 DIAGNOSIS — E1151 Type 2 diabetes mellitus with diabetic peripheral angiopathy without gangrene: Secondary | ICD-10-CM | POA: Diagnosis not present

## 2019-12-18 DIAGNOSIS — I739 Peripheral vascular disease, unspecified: Secondary | ICD-10-CM | POA: Diagnosis not present

## 2019-12-18 DIAGNOSIS — L84 Corns and callosities: Secondary | ICD-10-CM | POA: Diagnosis not present

## 2019-12-19 ENCOUNTER — Other Ambulatory Visit: Payer: Self-pay | Admitting: Family Medicine

## 2019-12-24 NOTE — Progress Notes (Deleted)
Phone [phone] In person visit   Subjective:   [name] is a 79 y.o. year old very pleasant female patient who presents for/with See problem oriented charting No chief complaint on file.   This visit occurred during the SARS-CoV-2 public health emergency.  Safety protocols were in place, including screening questions prior to the visit, additional usage of staff PPE, and extensive cleaning of exam room while observing appropriate contact time as indicated for disinfecting solutions.   Past Medical History-  Patient Active Problem List   Diagnosis Date Noted  . 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 11/22/2019  . Healthcare maintenance 02/22/2019  . Physical deconditioning 02/22/2019  . Shortness of breath 02/22/2019  . Adenomatous polyp 12/20/2018  . Pain due to onychomycosis of toenails of both feet 11/08/2018  . Diabetic neuropathy ([place]) 11/08/2018  . History of total knee replacement, bilateral 11/14/2017  . Personal history of colonic polyps 05/18/2017  . Arthritis of midfoot 03/03/2016  . Hammertoes of both feet 03/03/2016  . Aortic atherosclerosis ([place]) 02/16/2016  . Solitary pulmonary nodule 01/08/2016  . 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  . Bronchiectasis ([place]) 10/18/2015  . IBS (irritable bowel syndrome) 07/17/2014  . Recurrent major depression in partial remission ([place]) 01/03/2014  . Posterior tibial tendon dysfunction 01/03/2014  . Osteoarthritis, knee 01/03/2014  . Glaucoma 01/03/2014  . Hypertension associated with diabetes ([place]) 01/03/2014  . Obesity (BMI 30-39.9) 06/15/2013  . Primary open-angle glaucoma 08/02/2012  . Foot tendinitis 07/20/2010  . INSOMNIA, CHRONIC 07/25/2009  . Fatty liver 03/18/2009  . GERD 12/25/2007  . Hyperlipidemia associated with type 2 diabetes mellitus ([place]) 07/26/2007  . ANEMIA, B12 DEFICIENCY 12/29/2006  . Diabetes mellitus type II, controlled ([place]) 12/28/2006  . RESTLESS LEG SYNDROME, MILD 12/28/2006  .  NEUROPATHY, IDIOPATHIC PERIPHERAL NEC 12/28/2006  . Osteoporosis 12/12/2006  . BREAST CANCER, HX OF 12/12/2006    Medications- reviewed and updated Current Outpatient Medications  Medication Sig Dispense Refill  . atenolol (TENORMIN) 25 MG tablet Take 1 tablet by mouth once daily 90 tablet 0  . Bacillus Coagulans-Inulin (ALIGN PREBIOTIC-PROBIOTIC PO) Take 1 capsule by mouth daily.     . blood glucose meter kit and supplies KIT Dispense based on patient and insurance preference. Use up to four times daily as directed. Dx E11.9 1 each 0  . cetirizine (ZYRTEC) 10 MG tablet Take 10 mg by mouth every morning.     . Cholecalciferol (VITAMIN D3) 125 MCG (5000 UT) TABS Take 5,000 Units by mouth daily.    . Coenzyme Q10 (COQ10) 200 MG CAPS Take 200 mg by mouth daily.    . colesevelam (WELCHOL) 625 MG tablet Take 1 tablet (625 mg total) by mouth in the morning, at noon, and at bedtime. (Patient taking differently: Take 625 mg by mouth 2 (two) times daily with a meal. ) 90 tablet 3  . dicyclomine (BENTYL) 10 MG capsule Take 1 capsule (10 mg total) by mouth 3 (three) times daily before meals. 90 capsule 1  . dorzolamide-timolol (COSOPT) 22.3-6.8 MG/ML ophthalmic solution Place 1 drop into both eyes 2 (two) times daily.     [name] ESTRING 2 MG vaginal ring INSERT 1 RING  INTO VAGINA EVERY 3 MONTHS 1 each 3  . FLUoxetine (PROZAC) 40 MG capsule Take 1 capsule by mouth once daily 90 capsule 0  . fluticasone (FLONASE) 50 MCG/ACT nasal spray Place 2 sprays into both nostrils daily. (Patient taking differently: Place 2 sprays into both nostrils every morning. ) 16 g 11  .  glimepiride (AMARYL) 4 MG tablet Take 2 tablets (8 mg total) by mouth daily with breakfast. 180 tablet 3  . glucose blood (ACCU-CHEK GUIDE) test strip Use to test blood sugars daily. Dx: E11.9 100 each 6  . glucose blood test strip Use to test up to twice a day 200 each 1  . HYDROcodone-acetaminophen (NORCO) 10-325 MG tablet Take 1-2 tablets by  mouth every 6 (six) hours as needed for moderate pain or severe pain. 40 tablet 0  . hydrocortisone 2.5 % cream Apply 1 application topically 2 (two) times daily as needed (skin irritation/rash).     [name] ketoconazole (NIZORAL) 2 % cream as needed.     . Lancets Misc. (ACCU-CHEK FASTCLIX LANCET) KIT Use to test blood sugars daily. Dx: E11.9 1 kit 5  . latanoprost (XALATAN) 0.005 % ophthalmic solution Place 1 drop into both eyes at bedtime.    [name] lisinopril (ZESTRIL) 10 MG tablet Take 1 tablet (10 mg total) by mouth daily. 90 tablet 0  . lovastatin (MEVACOR) 10 MG tablet Take 1 tablet (10 mg total) by mouth every other day. 45 tablet 3  . Methylcellulose, Laxative, (CITRUCEL PO) Take 3 tablets by mouth at bedtime.    . Multiple Vitamin (MULTIVITAMIN WITH MINERALS) TABS tablet Take 1 tablet by mouth daily. Centrum Silver    . pramipexole (MIRAPEX) 0.5 MG tablet TAKE 1 TABLET BY MOUTH AT BEDTIME 90 tablet 0  . Respiratory Therapy Supplies (FLUTTER) DEVI Use as directed 1 each 0  . Selenium 200 MCG CAPS Take 200 mcg by mouth at bedtime.     . SYMBICORT 160-4.5 MCG/ACT inhaler INHALE 2 PUFFS BY MOUTH TWICE DAILY . 10.2 g 11  . temazepam (RESTORIL) 15 MG capsule TAKE 1 CAPSULE BY MOUTH AT BEDTIME AS NEEDED FOR  SLEEP 90 capsule 5  . VICTOZA 18 MG/3ML SOPN INJECT 1.8 MG INTO THE SKIN ONCE DAILY 9 mL 1  . vitamin B-12 (CYANOCOBALAMIN) 1000 MCG tablet Take 1,000 mcg by mouth daily.     No current facility-administered medications for this visit.     Objective:  LMP 05/10/1992 Comment: partial Gen: NAD, resting comfortably CV: RRR no murmurs rubs or gallops Lungs: CTAB no crackles, wheeze, rhonchi Abdomen: soft/nontender/nondistended/normal bowel sounds. No rebound or guarding.  Ext: no edema Skin: warm, dry Neuro: grossly normal, moves all extremities  ***    Assessment and Plan  *** Dr. Sabra Heck- ob/gyn. Estring.  Dr. Mickel Baas [place]-dermatology Dr. Calvert Cantor- optho Dr. Cresenciano Genre [facility]  optho. Glaucoma.  Dr. Verita Schneiders (hyperbaric for infection had in feet-ear plugs) Dr. Pilar Plate All[place]-ortho Dr. Delfin Edis- GI (no longer sees) Dr. Neldon Mc (surgeon)  ***01/19/18 awv- recommended again 06/18/19  ***I&D anal abscess  No problem-specific Assessment & Plan notes found for this encounter.   Recommended follow up: ***No follow-ups on file. Future Appointments  Date Time Provider [place]  12/31/2019 10:30 AM Megan Salon, MD [facility] None  12/31/2019  3:00 PM Marin Olp, MD [facility]    Lab/Order associations: No diagnosis found.  No orders of the defined types were placed in this encounter.   Time Spent: *** minutes of total time (9:52 AM***- 9:52 AM***) was spent on the date of the encounter performing the following actions: chart review prior to seeing the patient, obtaining history, performing a medically necessary exam, counseling on the treatment plan, placing orders, and documenting in our EHR.   Return precautions advised.  Clyde Lundborg, CMA

## 2019-12-24 NOTE — Progress Notes (Deleted)
GYNECOLOGY  VISIT  CC:   ***  HPI: 79 y.o. G0P0 Married White or Caucasian female here for estring exchange.  GYNECOLOGIC HISTORY: Patient's last menstrual period was 05/10/1992. Contraception: hysterectomy Menopausal hormone therapy: estring  Patient Active Problem List   Diagnosis Date Noted  . 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 11/22/2019  . Healthcare maintenance 02/22/2019  . Physical deconditioning 02/22/2019  . Shortness of breath 02/22/2019  . Adenomatous polyp 12/20/2018  . Pain due to onychomycosis of toenails of both feet 11/08/2018  . Diabetic neuropathy ([place]) 11/08/2018  . History of total knee replacement, bilateral 11/14/2017  . Personal history of colonic polyps 05/18/2017  . Arthritis of midfoot 03/03/2016  . Hammertoes of both feet 03/03/2016  . Aortic atherosclerosis ([place]) 02/16/2016  . Solitary pulmonary nodule 01/08/2016  . 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  . Bronchiectasis ([place]) 10/18/2015  . IBS (irritable bowel syndrome) 07/17/2014  . Recurrent major depression in partial remission ([place]) 01/03/2014  . Posterior tibial tendon dysfunction 01/03/2014  . Osteoarthritis, knee 01/03/2014  . Glaucoma 01/03/2014  . Hypertension associated with diabetes ([place]) 01/03/2014  . Obesity (BMI 30-39.9) 06/15/2013  . Primary open-angle glaucoma 08/02/2012  . Foot tendinitis 07/20/2010  . INSOMNIA, CHRONIC 07/25/2009  . Fatty liver 03/18/2009  . GERD 12/25/2007  . Hyperlipidemia associated with type 2 diabetes mellitus ([place]) 07/26/2007  . ANEMIA, B12 DEFICIENCY 12/29/2006  . Diabetes mellitus type II, controlled ([place]) 12/28/2006  . RESTLESS LEG SYNDROME, MILD 12/28/2006  . NEUROPATHY, IDIOPATHIC PERIPHERAL NEC 12/28/2006  . Osteoporosis 12/12/2006  . BREAST CANCER, HX OF 12/12/2006    Past Medical History:  Diagnosis Date  . Allergy   . Anemia   . Anxiety   . Arthritis    right knee;injections every 56month   . Asthma   . Breast cancer  ([place] 1994[place]   HX BREAST CANCER/ right brreast and left breast in 1995  . Bronchiectasis ([facility]   . COMPRESSION FRACTURE, LUMBAR VERTEBRAE 08/21/2008   Qualifier: Diagnosis of  By: JArnoldo MoraleMD, JBalinda Quails  . Depression   . Diabetes mellitus    takes Amaryl and Januvia daily  . Difficulty sleeping   . Diverticulitis   . Early cataracts, bilateral   . Eczema   . Endometriosis   . Fatty liver   . Gastritis   . Glaucoma   . Hammer toe   . History of kidney stones   . Hyperlipidemia associated with type 2 diabetes mellitus ([facility] 07/26/2007   Diet/exercise control.    . Hypertension    takes Atenolol daily  . IBS (irritable bowel syndrome)   . Joint pain   . Migraine   . Neuropathy    BILATERAL FEET AND MID CALF  . Open wound of second toe of left foot in past   at pre-op appt, wound appears clear of infection 1 month post removal of toenail, no obvious exudate present nor any redness,  . Osteomyelitis ([facility]    left 2nd toe  . Osteopenia   . Perirectal fistula   . Pneumonia   . PONV (postoperative nausea and vomiting)   . Posterior tibial tendon dysfunction    left foot  . Restless leg syndrome   . Scoliosis   . Severe esophageal dysplasia   . 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 Thyroid disease   . Vitamin D deficiency    takes Vit d daily  . Weakness    uses a walker  and wheelchair    Past Surgical History:  Procedure Laterality Date  . ADENOIDECTOMY     at age 70  . BIOPSY  12/19/2018   Procedure: BIOPSY;  Surgeon: Mauri Pole, MD;  [facility];  Service: Endoscopy;;  . CATARACT EXTRACTION    . CHOLECYSTECTOMY  06/2008  . COLONOSCOPY WITH PROPOFOL N/A 05/17/2017   Procedure: COLONOSCOPY WITH PROPOFOL;  Surgeon: Mauri Pole, MD;  [facility];  Service: Endoscopy;  Laterality: N/A;  PT WILL BE ADMITTED THE DAY BEFORE FOR PREP PER ROBIN KB  . COLONOSCOPY WITH PROPOFOL N/A 12/19/2018   Procedure:  COLONOSCOPY WITH PROPOFOL;  Surgeon: Mauri Pole, MD;  [facility];  Service: Endoscopy;  Laterality: N/A;  . DILATION AND CURETTAGE OF UTERUS    . excision on breast     internal infected suture from breast surgery  . excision removed from neck     infected lymph node  . FOOT SURGERY     left foot-cleaning out areas on toes at the wound center-having MRI to determine if osteomyelitis  . HEMORRHOID SURGERY N/A 11/22/2019   Procedure: HEMORRHOIDECTOMY LIGATION , PEXY;  Surgeon: Michael Boston, MD;  Location: [facility];  Service: General;  Laterality: N/A;  . HYPERBARIC OXYGEN THERAPY     FEET INFECTION  . HYSTEROSCOPY  06/22/11   PMB submucosal myoma  . JOINT REPLACEMENT     left total shoulder  . LAPAROSCOPIC OOPHORECTOMY Right 12/2004   absent LSO  . LAPAROTOMY    . LIGATION OF INTERNAL FISTULA TRACT N/A 11/22/2019   Procedure: REPAIR OF PERIRECTAL FISTULA, ANORECTAL EXAMINATION UNDER ANESTHESIA;  Surgeon: Michael Boston, MD;  Location: [facility];  Service: General;  Laterality: N/A;  . MASTECTOMY Bilateral 1994 right, 1995 left  . POLYPECTOMY  12/19/2018   Procedure: POLYPECTOMY;  Surgeon: Mauri Pole, MD;  [facility];  Service: Endoscopy;;  . SHOULDER HEMI-ARTHROPLASTY  01/01/2012   Procedure: SHOULDER HEMI-ARTHROPLASTY;  Surgeon: Roseanne Kaufman, MD;  Location: [facility];  Service: Orthopedics;  Laterality: Left;  Left Shoulder Hemi Arthroplasty with Repair and Reconstruction as Necessary   . THYROIDECTOMY     18yr ago. follows endocrine  . TONSILLECTOMY    . TOTAL KNEE ARTHROPLASTY Right 12/30/2014   Procedure: RIGHT TOTAL KNEE ARTHROPLASTY;  Surgeon: FGaynelle Arabian MD;  [facility];  Service: Orthopedics;  Laterality: Right;  . TOTAL KNEE ARTHROPLASTY Left 11/29/2016   Procedure: LEFT TOTAL KNEE ARTHROPLASTY;  Surgeon: AGaynelle Arabian MD;  [facility];  Service: Orthopedics;  Laterality: Left;    MEDS:    Current Outpatient Medications on File Prior to Visit  Medication Sig Dispense Refill  . atenolol (TENORMIN) 25 MG tablet Take 1 tablet by mouth once daily 90 tablet 0  . Bacillus Coagulans-Inulin (ALIGN PREBIOTIC-PROBIOTIC PO) Take 1 capsule by mouth daily.     . blood glucose meter kit and supplies KIT Dispense based on patient and insurance preference. Use up to four times daily as directed. Dx E11.9 1 each 0  . cetirizine (ZYRTEC) 10 MG tablet Take 10 mg by mouth every morning.     . Cholecalciferol (VITAMIN D3) 125 MCG (5000 UT) TABS Take 5,000 Units by mouth daily.    . Coenzyme Q10 (COQ10) 200 MG CAPS Take 200 mg by mouth daily.    . colesevelam (WELCHOL) 625 MG tablet Take 1 tablet (625 mg total) by mouth in the morning, at noon, and at bedtime. (Patient  taking differently: Take 625 mg by mouth 2 (two) times daily with a meal. ) 90 tablet 3  . dicyclomine (BENTYL) 10 MG capsule Take 1 capsule (10 mg total) by mouth 3 (three) times daily before meals. 90 capsule 1  . dorzolamide-timolol (COSOPT) 22.3-6.8 MG/ML ophthalmic solution Place 1 drop into both eyes 2 (two) times daily.     [name] ESTRING 2 MG vaginal ring INSERT 1 RING  INTO VAGINA EVERY 3 MONTHS 1 each 3  . FLUoxetine (PROZAC) 40 MG capsule Take 1 capsule by mouth once daily 90 capsule 0  . fluticasone (FLONASE) 50 MCG/ACT nasal spray Place 2 sprays into both nostrils daily. (Patient taking differently: Place 2 sprays into both nostrils every morning. ) 16 g 11  . glimepiride (AMARYL) 4 MG tablet Take 2 tablets (8 mg total) by mouth daily with breakfast. 180 tablet 3  . glucose blood (ACCU-CHEK GUIDE) test strip Use to test blood sugars daily. Dx: E11.9 100 each 6  . glucose blood test strip Use to test up to twice a day 200 each 1  . HYDROcodone-acetaminophen (NORCO) 10-325 MG tablet Take 1-2 tablets by mouth every 6 (six) hours as needed for moderate pain or severe pain. 40 tablet 0  . hydrocortisone 2.5 % cream Apply 1 application  topically 2 (two) times daily as needed (skin irritation/rash).     [name] ketoconazole (NIZORAL) 2 % cream as needed.     . Lancets Misc. (ACCU-CHEK FASTCLIX LANCET) KIT Use to test blood sugars daily. Dx: E11.9 1 kit 5  . latanoprost (XALATAN) 0.005 % ophthalmic solution Place 1 drop into both eyes at bedtime.    [name] lisinopril (ZESTRIL) 10 MG tablet Take 1 tablet (10 mg total) by mouth daily. 90 tablet 0  . lovastatin (MEVACOR) 10 MG tablet Take 1 tablet (10 mg total) by mouth every other day. 45 tablet 3  . Methylcellulose, Laxative, (CITRUCEL PO) Take 3 tablets by mouth at bedtime.    . Multiple Vitamin (MULTIVITAMIN WITH MINERALS) TABS tablet Take 1 tablet by mouth daily. Centrum Silver    . pramipexole (MIRAPEX) 0.5 MG tablet TAKE 1 TABLET BY MOUTH AT BEDTIME 90 tablet 0  . Respiratory Therapy Supplies (FLUTTER) DEVI Use as directed 1 each 0  . Selenium 200 MCG CAPS Take 200 mcg by mouth at bedtime.     . SYMBICORT 160-4.5 MCG/ACT inhaler INHALE 2 PUFFS BY MOUTH TWICE DAILY . 10.2 g 11  . temazepam (RESTORIL) 15 MG capsule TAKE 1 CAPSULE BY MOUTH AT BEDTIME AS NEEDED FOR  SLEEP 90 capsule 5  . VICTOZA 18 MG/3ML SOPN INJECT 1.8 MG INTO THE SKIN ONCE DAILY 9 mL 1  . vitamin B-12 (CYANOCOBALAMIN) 1000 MCG tablet Take 1,000 mcg by mouth daily.     No current facility-administered medications on file prior to visit.    ALLERGIES: Nitrofurantoin, Levaquin [levofloxacin in d5w], Brimonidine, Cephalexin, Erythromycin ethylsuccinate, and Oxycodone  Family History  Problem Relation Age of Onset  . Arthritis Mother   . COPD Father   . Heart disease Father        MI 37 - states he died of lockjaw  . Hypertension Father   . Hyperlipidemia Father   . Pancreatic cancer Paternal Grandfather   . Colon cancer Neg Hx   . Esophageal cancer Neg Hx   . Rectal cancer Neg Hx   . Stomach cancer Neg Hx     SH:  ***  Review of Systems  PHYSICAL EXAMINATION:  LMP 05/10/1992 Comment: partial     General appearance: alert, cooperative and appears stated age Neck: no adenopathy, supple, symmetrical, trachea midline and thyroid {CHL AMB PHY EX THYROID NORM DEFAULT:[phone]::"normal to inspection and palpation"} CV:  {Exam; heart brief:31539} Lungs:  {pe lungs ob:314451::"clear to auscultation, no wheezes, rales or rhonchi, symmetric air entry"} Breasts: {Exam; breast:13139::"normal appearance, no masses or tenderness"} Abdomen: soft, non-tender; bowel sounds normal; no masses,  no organomegaly Lymph:  no inguinal LAD noted  Pelvic: External genitalia:  no lesions              Urethra:  normal appearing urethra with no masses, tenderness or lesions              Bartholins and Skenes: normal                 Vagina: normal appearing vagina with normal color and discharge, no lesions              Cervix: {CHL AMB PHY EX CERVIX NORM [phone]::"no lesions"}              Bimanual Exam:  Uterus:  {CHL AMB PHY EX UTERUS NORM DEFAULT:[phone]::"normal size, contour, position, consistency, mobility, non-tender"}              Adnexa: {CHL AMB PHY EX ADNEXA NO MASS DEFAULT:[phone]::"no mass, fullness, tenderness"}              Rectovaginal: {yes no:[id number]}.  Confirms.              Anus:  normal sphincter tone, no lesions  Chaperone, ***Terence Lux, CMA, was present for exam.  Assessment: ***  Plan: ***   ~{NUMBERS; -10-45 JOINT ROM:10287} minutes spent with patient >50% of time was in face to face discussion of above.

## 2019-12-28 ENCOUNTER — Other Ambulatory Visit: Payer: Self-pay | Admitting: Family Medicine

## 2019-12-28 ENCOUNTER — Ambulatory Visit: Payer: Medicare PPO | Admitting: Podiatry

## 2019-12-31 ENCOUNTER — Ambulatory Visit: Payer: Medicare PPO | Admitting: Family Medicine

## 2019-12-31 ENCOUNTER — Telehealth: Payer: Self-pay

## 2019-12-31 ENCOUNTER — Ambulatory Visit: Payer: Medicare PPO | Admitting: Obstetrics & Gynecology

## 2019-12-31 NOTE — Telephone Encounter (Signed)
Spoke with pt. Pt states having diarrhea since yesterday and having upset stomach. Pt needing to change OV for Estring replacement with Dr Sabra Heck. Pt scheduled for 01/07/20 at 1100 am with Dr Sabra Heck. Pt agreeable and verbalized understanding of date and time of appt.  Encounter closed.

## 2019-12-31 NOTE — Telephone Encounter (Signed)
Patient called to cancel office visit for today due to diarrhea. Patient would like to reschedule.

## 2020-01-01 NOTE — Telephone Encounter (Signed)
LR: 06-18-2019 Qty: 90 with 5 refills  Last office visit: 09-24-2019 Upcoming appointment: 01-08-2020

## 2020-01-02 DIAGNOSIS — H401133 Primary open-angle glaucoma, bilateral, severe stage: Secondary | ICD-10-CM | POA: Diagnosis not present

## 2020-01-02 DIAGNOSIS — H04123 Dry eye syndrome of bilateral lacrimal glands: Secondary | ICD-10-CM | POA: Diagnosis not present

## 2020-01-03 NOTE — Progress Notes (Signed)
GYNECOLOGY  VISIT  CC:   Estring placement.  Questions about recent surgical procedure.  HPI: 79 y.o. G0P0 Married White or Caucasian female here for estring placement.  Pt has difficulty with removal and replacement due to inability to reach adequately for placement.  Has recently undergone repair of perirectal fistula and hemorrhoidectomy.  Reports she has daily bleeding since procedure.  Recently felt around the area and felt firmness and open hole and is concerned it is not healing correctly.  Denies fecal leakage.  Denies fever.  Denies abnormal odor.  Just not sure this is what it is supposed to be doing from healing standpoint.  Separately, reports her grandchild was just removed from son and grandson's mother by DSS.  Pt and husband actually had custody but grandson spent most of time with parents.  He is in foster care somewhere in [place] near [place].  She is worried and upset by all of this.    GYNECOLOGIC HISTORY: Patient's last menstrual period was 05/10/1992. Contraception: hysterectomy Menopausal hormone therapy: estring  Patient Active Problem List   Diagnosis Date Noted  . 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 11/22/2019  . Healthcare maintenance 02/22/2019  . Physical deconditioning 02/22/2019  . Shortness of breath 02/22/2019  . Adenomatous polyp 12/20/2018  . Pain due to onychomycosis of toenails of both feet 11/08/2018  . Diabetic neuropathy ([place]) 11/08/2018  . History of total knee replacement, bilateral 11/14/2017  . Personal history of colonic polyps 05/18/2017  . Arthritis of midfoot 03/03/2016  . Hammertoes of both feet 03/03/2016  . Aortic atherosclerosis ([place]) 02/16/2016  . Solitary pulmonary nodule 01/08/2016  . 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  . Bronchiectasis ([place]) 10/18/2015  . IBS (irritable bowel syndrome) 07/17/2014  . Recurrent major depression in partial remission ([place]) 01/03/2014  . Posterior tibial tendon dysfunction  01/03/2014  . Osteoarthritis, knee 01/03/2014  . Glaucoma 01/03/2014  . Hypertension associated with diabetes ([place]) 01/03/2014  . Obesity (BMI 30-39.9) 06/15/2013  . Primary open-angle glaucoma 08/02/2012  . Foot tendinitis 07/20/2010  . INSOMNIA, CHRONIC 07/25/2009  . Fatty liver 03/18/2009  . GERD 12/25/2007  . Hyperlipidemia associated with type 2 diabetes mellitus ([place]) 07/26/2007  . ANEMIA, B12 DEFICIENCY 12/29/2006  . Diabetes mellitus type II, controlled ([place]) 12/28/2006  . RESTLESS LEG SYNDROME, MILD 12/28/2006  . NEUROPATHY, IDIOPATHIC PERIPHERAL NEC 12/28/2006  . Osteoporosis 12/12/2006  . BREAST CANCER, HX OF 12/12/2006    Past Medical History:  Diagnosis Date  . Allergy   . Anemia   . Anxiety   . Arthritis    right knee;injections every 40month   . Asthma   . Breast cancer ([place] 1994[place]   HX BREAST CANCER/ right brreast and left breast in 1995  . Bronchiectasis ([facility]   . COMPRESSION FRACTURE, LUMBAR VERTEBRAE 08/21/2008   Qualifier: Diagnosis of  By: JArnoldo MoraleMD, JBalinda Quails  . Depression   . Diabetes mellitus    takes Amaryl and Januvia daily  . Difficulty sleeping   . Diverticulitis   . Early cataracts, bilateral   . Eczema   . Endometriosis   . Fatty liver   . Gastritis   . Glaucoma   . Hammer toe   . History of kidney stones   . Hyperlipidemia associated with type 2 diabetes mellitus ([place] 07/26/2007   Diet/exercise control.    . Hypertension    takes Atenolol daily  . IBS (irritable bowel syndrome)   . Joint pain   . Migraine   . Neuropathy  BILATERAL FEET AND MID CALF  . Open wound of second toe of left foot in past   at pre-op appt, wound appears clear of infection 1 month post removal of toenail, no obvious exudate present nor any redness,  . Osteomyelitis ([place])    left 2nd toe  . Osteopenia   . Perirectal fistula   . Pneumonia   . PONV (postoperative nausea and vomiting)   . Posterior tibial tendon dysfunction    left foot  . Restless  leg syndrome   . Scoliosis   . Severe esophageal dysplasia   . 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 Thyroid disease   . Vitamin D deficiency    takes Vit d daily  . Weakness    uses a walker and wheelchair    Past Surgical History:  Procedure Laterality Date  . ADENOIDECTOMY     at age 42  . BIOPSY  12/19/2018   Procedure: BIOPSY;  Surgeon: Mauri Pole, MD;  [facility];  Service: Endoscopy;;  . CATARACT EXTRACTION    . CHOLECYSTECTOMY  06/2008  . COLONOSCOPY WITH PROPOFOL N/A 05/17/2017   Procedure: COLONOSCOPY WITH PROPOFOL;  Surgeon: Mauri Pole, MD;  [facility];  Service: Endoscopy;  Laterality: N/A;  PT WILL BE ADMITTED THE DAY BEFORE FOR PREP PER ROBIN KB  . COLONOSCOPY WITH PROPOFOL N/A 12/19/2018   Procedure: COLONOSCOPY WITH PROPOFOL;  Surgeon: Mauri Pole, MD;  [facility];  Service: Endoscopy;  Laterality: N/A;  . DILATION AND CURETTAGE OF UTERUS    . excision on breast     internal infected suture from breast surgery  . excision removed from neck     infected lymph node  . FOOT SURGERY     left foot-cleaning out areas on toes at the wound center-having MRI to determine if osteomyelitis  . HEMORRHOID SURGERY N/A 11/22/2019   Procedure: HEMORRHOIDECTOMY LIGATION , PEXY;  Surgeon: Michael Boston, MD;  Location: [facility];  Service: General;  Laterality: N/A;  . HYPERBARIC OXYGEN THERAPY     FEET INFECTION  . HYSTEROSCOPY  06/22/11   PMB submucosal myoma  . JOINT REPLACEMENT     left total shoulder  . LAPAROSCOPIC OOPHORECTOMY Right 12/2004   absent LSO  . LAPAROTOMY    . LIGATION OF INTERNAL FISTULA TRACT N/A 11/22/2019   Procedure: REPAIR OF PERIRECTAL FISTULA, ANORECTAL EXAMINATION UNDER ANESTHESIA;  Surgeon: Michael Boston, MD;  Location: [facility];  Service: General;  Laterality: N/A;  . MASTECTOMY Bilateral 1994 right, 1995 left  .  POLYPECTOMY  12/19/2018   Procedure: POLYPECTOMY;  Surgeon: Mauri Pole, MD;  [facility];  Service: Endoscopy;;  . SHOULDER HEMI-ARTHROPLASTY  01/01/2012   Procedure: SHOULDER HEMI-ARTHROPLASTY;  Surgeon: Roseanne Kaufman, MD;  Location: [facility];  Service: Orthopedics;  Laterality: Left;  Left Shoulder Hemi Arthroplasty with Repair and Reconstruction as Necessary   . THYROIDECTOMY     63yr ago. follows endocrine  . TONSILLECTOMY    . TOTAL KNEE ARTHROPLASTY Right 12/30/2014   Procedure: RIGHT TOTAL KNEE ARTHROPLASTY;  Surgeon: FGaynelle Arabian MD;  [facility];  Service: Orthopedics;  Laterality: Right;  . TOTAL KNEE ARTHROPLASTY Left 11/29/2016   Procedure: LEFT TOTAL KNEE ARTHROPLASTY;  Surgeon: AGaynelle Arabian MD;  [facility];  Service: Orthopedics;  Laterality: Left;    MEDS:   Current Outpatient Medications on File Prior to Visit  Medication Sig  Dispense Refill  . atenolol (TENORMIN) 25 MG tablet Take 1 tablet by mouth once daily 90 tablet 0  . Bacillus Coagulans-Inulin (ALIGN PREBIOTIC-PROBIOTIC PO) Take 1 capsule by mouth daily.     . blood glucose meter kit and supplies KIT Dispense based on patient and insurance preference. Use up to four times daily as directed. Dx E11.9 1 each 0  . cetirizine (ZYRTEC) 10 MG tablet Take 10 mg by mouth every morning.     . Cholecalciferol (VITAMIN D3) 125 MCG (5000 UT) TABS Take 5,000 Units by mouth daily.    . Coenzyme Q10 (COQ10) 200 MG CAPS Take 200 mg by mouth daily.    . colesevelam (WELCHOL) 625 MG tablet Take 1 tablet (625 mg total) by mouth in the morning, at noon, and at bedtime. (Patient taking differently: Take 625 mg by mouth 2 (two) times daily with a meal. ) 90 tablet 3  . dicyclomine (BENTYL) 10 MG capsule Take 1 capsule (10 mg total) by mouth 3 (three) times daily before meals. 90 capsule 1  . dorzolamide-timolol (COSOPT) 22.3-6.8 MG/ML ophthalmic solution Place 1 drop into both eyes 2 (two) times daily.     [name]  ESTRING 2 MG vaginal ring INSERT 1 RING  INTO VAGINA EVERY 3 MONTHS 1 each 3  . FLUoxetine (PROZAC) 40 MG capsule Take 1 capsule by mouth once daily 90 capsule 0  . fluticasone (FLONASE) 50 MCG/ACT nasal spray Place 2 sprays into both nostrils daily. (Patient taking differently: Place 2 sprays into both nostrils every morning. ) 16 g 11  . glimepiride (AMARYL) 4 MG tablet Take 2 tablets (8 mg total) by mouth daily with breakfast. 180 tablet 3  . glucose blood (ACCU-CHEK GUIDE) test strip Use to test blood sugars daily. Dx: E11.9 100 each 6  . glucose blood test strip Use to test up to twice a day 200 each 1  . Lancets Misc. (ACCU-CHEK FASTCLIX LANCET) KIT Use to test blood sugars daily. Dx: E11.9 1 kit 5  . latanoprost (XALATAN) 0.005 % ophthalmic solution Place 1 drop into both eyes at bedtime.    [name] lisinopril (ZESTRIL) 10 MG tablet Take 1 tablet (10 mg total) by mouth daily. 90 tablet 0  . lovastatin (MEVACOR) 10 MG tablet Take 1 tablet (10 mg total) by mouth every other day. 45 tablet 3  . Misc Natural Products (NEURIVA PO) Take by mouth.    . Multiple Vitamin (MULTIVITAMIN WITH MINERALS) TABS tablet Take 1 tablet by mouth daily. Centrum Silver    . pramipexole (MIRAPEX) 0.5 MG tablet TAKE 1 TABLET BY MOUTH AT BEDTIME 90 tablet 0  . Respiratory Therapy Supplies (FLUTTER) DEVI Use as directed 1 each 0  . ROCKLATAN 0.02-0.005 % SOLN     . Selenium 200 MCG CAPS Take 200 mcg by mouth at bedtime.     . SYMBICORT 160-4.5 MCG/ACT inhaler INHALE 2 PUFFS BY MOUTH TWICE DAILY . 10.2 g 11  . temazepam (RESTORIL) 15 MG capsule TAKE 1 CAPSULE BY MOUTH AT BEDTIME AS NEEDED FOR SLEEP 30 capsule 5  . UNABLE TO FIND Fiber therapy    . VICTOZA 18 MG/3ML SOPN INJECT 1.8 MG INTO THE SKIN ONCE DAILY 9 mL 1  . vitamin B-12 (CYANOCOBALAMIN) 1000 MCG tablet Take 1,000 mcg by mouth daily.    . hydrocortisone 2.5 % cream Apply 1 application topically 2 (two) times daily as needed (skin irritation/rash).  (Patient not  taking: Reported on 01/07/2020)    . ketoconazole (  NIZORAL) 2 % cream as needed.  (Patient not taking: Reported on 01/07/2020)     No current facility-administered medications on file prior to visit.    ALLERGIES: Nitrofurantoin, Levaquin [levofloxacin in d5w], Brimonidine, Cephalexin, Erythromycin ethylsuccinate, and Oxycodone  Family History  Problem Relation Age of Onset  . Arthritis Mother   . COPD Father   . Heart disease Father        MI 69 - states he died of lockjaw  . Hypertension Father   . Hyperlipidemia Father   . Pancreatic cancer Paternal Grandfather   . Colon cancer Neg Hx   . Esophageal cancer Neg Hx   . Rectal cancer Neg Hx   . Stomach cancer Neg Hx     SH:  Married, non smoker  Review of Systems  Constitutional: Negative.   HENT: Negative.   Eyes: Negative.   Respiratory: Negative.   Cardiovascular: Negative.   Gastrointestinal: Negative.   Endocrine: Negative.   Genitourinary: Negative.   Musculoskeletal: Negative.   Skin: Negative.   Allergic/Immunologic: Negative.   Neurological: Negative.   Hematological: Negative.   Psychiatric/Behavioral: Negative.     PHYSICAL EXAMINATION:    BP 124/74   Pulse 68   Resp 16   LMP 05/10/1992 Comment: partial    General appearance: alert, cooperative and appears stated age Lymph:  no inguinal LAD noted  Pelvic: External genitalia:  no lesions              Urethra:  normal appearing urethra with no masses, tenderness or lesions              Bartholins and Skenes: normal                 Vagina: normal appearing vagina with normal color and discharge, no lesions              Cervix: no lesions, estring placed without difficulty              Bimanual Exam:  Uterus:  normal size, contour, position, consistency, mobility, non-tender              Adnexa: no mass, fullness, tenderness              Anus:  About 2cm defect that is probed with sterile q tip and is about 2.5cm deep.  Granulation tissue present, no  abnormal drainage, induration around defect is present, this is all to the left of the rectum  Chaperone, Karmen Bongo, CMA, was present for exam.  Assessment: Vaginal atrophy, using Estring which was placed today Repair of perirectal fistula Recent personal stressors  Plan: Will review with general surgery if fistula repair is healing like it should be.  She does have follow up scheduled in another 6 weeks Return for estring removal and replacement in 3-4 months   28 minutes spent in total with pt

## 2020-01-07 ENCOUNTER — Other Ambulatory Visit: Payer: Self-pay

## 2020-01-07 ENCOUNTER — Encounter: Payer: Self-pay | Admitting: Obstetrics & Gynecology

## 2020-01-07 ENCOUNTER — Ambulatory Visit: Payer: Medicare PPO | Admitting: Obstetrics & Gynecology

## 2020-01-07 VITALS — BP 124/74 | HR 68 | Resp 16

## 2020-01-07 DIAGNOSIS — Z658 Other specified problems related to psychosocial circumstances: Secondary | ICD-10-CM

## 2020-01-07 DIAGNOSIS — K604 Rectal fistula: Secondary | ICD-10-CM | POA: Diagnosis not present

## 2020-01-07 DIAGNOSIS — N952 Postmenopausal atrophic vaginitis: Secondary | ICD-10-CM | POA: Diagnosis not present

## 2020-01-07 NOTE — Patient Instructions (Addendum)
Health Maintenance Due  Topic Date Due  . Hepatitis C Screening - declined Never done  . TETANUS/TDAP - recommend considering this at pharmacy 12/24/2017  . INFLUENZA VACCINE --  - High dose flu shot needed but we dont have available yet - will complete later in flu season (please let us know if you get this at another location so we can update your chart) . We should have vaccination here in 1-2 months - can call back for an appointment.   12/09/2019   Patient  October 15th would be your booster date for covid 2   Start with gabapentin just once a day- be careful as it can cause some balance issues. Can increase to twice a day after a week if needed or after another week- can increase to a third time.   Please stop by lab before you go If you have mychart- we will send your results within 3 business days of [name] receiving them.  If you do not have mychart- we will call you about results within 5 business days of [name] receiving them.  *please note we are currently using [facility] which has a longer processing time than [facility] typically so labs may not come back as quickly as in the past *please also note that you will see labs on mychart as soon as they post. I will later go in and write notes on them- will say "notes from Dr. Yong Channel"

## 2020-01-07 NOTE — Progress Notes (Signed)
Phone [phone] In person visit   Subjective:   [name] is a 79 y.o. year old very pleasant female patient who presents for/with See problem oriented charting Chief Complaint  Patient presents with  . Urinary Tract Infection    Pain with urination    This visit occurred during the SARS-CoV-2 public health emergency.  Safety protocols were in place, including screening questions prior to the visit, additional usage of staff PPE, and extensive cleaning of exam room while observing appropriate contact time as indicated for disinfecting solutions.   Past Medical History-  Patient Active Problem List   Diagnosis Date Noted  . 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    Priority: High  . Bronchiectasis ([place]) 10/18/2015    Priority: High  . Diabetes mellitus type II, controlled ([place]) 12/28/2006    Priority: High  . Adenomatous polyp 12/20/2018    Priority: Medium  . Personal history of colonic polyps 05/18/2017    Priority: Medium  . Aortic atherosclerosis ([place]) 02/16/2016    Priority: Medium  . Solitary pulmonary nodule 01/08/2016    Priority: Medium  . IBS (irritable bowel syndrome) 07/17/2014    Priority: Medium  . Recurrent major depression in partial remission ([place]) 01/03/2014    Priority: Medium  . Hypertension associated with diabetes ([place]) 01/03/2014    Priority: Medium  . Primary open-angle glaucoma 08/02/2012    Priority: Medium  . Fatty liver 03/18/2009    Priority: Medium  . Hyperlipidemia associated with type 2 diabetes mellitus ([place]) 07/26/2007    Priority: Medium  . ANEMIA, B12 DEFICIENCY 12/29/2006    Priority: Medium  . RESTLESS LEG SYNDROME, MILD 12/28/2006    Priority: Medium  . Osteoporosis 12/12/2006    Priority: Medium  . History of total knee replacement, bilateral 11/14/2017    Priority: Low  . Arthritis of midfoot 03/03/2016    Priority: Low  . Hammertoes of both feet 03/03/2016    Priority: Low  . Posterior tibial tendon dysfunction 01/03/2014      Priority: Low  . Osteoarthritis, knee 01/03/2014    Priority: Low  . Glaucoma 01/03/2014    Priority: Low  . Obesity (BMI 30-39.9) 06/15/2013    Priority: Low  . Foot tendinitis 07/20/2010    Priority: Low  . INSOMNIA, CHRONIC 07/25/2009    Priority: Low  . GERD 12/25/2007    Priority: Low  . NEUROPATHY, IDIOPATHIC PERIPHERAL NEC 12/28/2006    Priority: Low  . BREAST CANCER, HX OF 12/12/2006    Priority: Low  . 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 11/22/2019  . Healthcare maintenance 02/22/2019  . Physical deconditioning 02/22/2019  . Shortness of breath 02/22/2019  . Pain due to onychomycosis of toenails of both feet 11/08/2018  . Diabetic neuropathy ([place]) 11/08/2018    Medications- reviewed and updated Current Outpatient Medications  Medication Sig Dispense Refill  . atenolol (TENORMIN) 25 MG tablet Take 1 tablet by mouth once daily 90 tablet 0  . Bacillus Coagulans-Inulin (ALIGN PREBIOTIC-PROBIOTIC PO) Take 1 capsule by mouth daily.     . blood glucose meter kit and supplies KIT Dispense based on patient and insurance preference. Use up to four times daily as directed. Dx E11.9 1 each 0  . cetirizine (ZYRTEC) 10 MG tablet Take 10 mg by mouth every morning.     . Cholecalciferol (VITAMIN D3) 125 MCG (5000 UT) TABS Take 5,000 Units by mouth daily.    . Coenzyme Q10 (COQ10) 200 MG CAPS Take 200 mg by mouth daily.    [name]  colesevelam (WELCHOL) 625 MG tablet Take 1 tablet (625 mg total) by mouth in the morning, at noon, and at bedtime. (Patient taking differently: Take 625 mg by mouth 2 (two) times daily with a meal. ) 90 tablet 3  . dicyclomine (BENTYL) 10 MG capsule Take 1 capsule (10 mg total) by mouth 3 (three) times daily before meals. 90 capsule 1  . dorzolamide-timolol (COSOPT) 22.3-6.8 MG/ML ophthalmic solution Place 1 drop into both eyes 2 (two) times daily.     [name] ESTRING 2 MG vaginal ring INSERT 1 RING  INTO VAGINA EVERY 3 MONTHS 1 each 3  .  FLUoxetine (PROZAC) 40 MG capsule Take 1 capsule by mouth once daily 90 capsule 0  . fluticasone (FLONASE) 50 MCG/ACT nasal spray Place 2 sprays into both nostrils daily. (Patient taking differently: Place 2 sprays into both nostrils every morning. ) 16 g 11  . glimepiride (AMARYL) 4 MG tablet Take 2 tablets (8 mg total) by mouth daily with breakfast. 180 tablet 3  . glucose blood (ACCU-CHEK GUIDE) test strip Use to test blood sugars daily. Dx: E11.9 100 each 6  . glucose blood test strip Use to test up to twice a day 200 each 1  . hydrocortisone 2.5 % cream Apply 1 application topically 2 (two) times daily as needed (skin irritation/rash).     [name] ketoconazole (NIZORAL) 2 % cream as needed.     . Lancets Misc. (ACCU-CHEK FASTCLIX LANCET) KIT Use to test blood sugars daily. Dx: E11.9 1 kit 5  . latanoprost (XALATAN) 0.005 % ophthalmic solution Place 1 drop into both eyes at bedtime.    [name] lisinopril (ZESTRIL) 10 MG tablet Take 1 tablet (10 mg total) by mouth daily. 90 tablet 0  . lovastatin (MEVACOR) 10 MG tablet Take 1 tablet (10 mg total) by mouth every other day. 45 tablet 3  . Misc Natural Products (NEURIVA PO) Take by mouth.    . Multiple Vitamin (MULTIVITAMIN WITH MINERALS) TABS tablet Take 1 tablet by mouth daily. Centrum Silver    . pramipexole (MIRAPEX) 0.5 MG tablet TAKE 1 TABLET BY MOUTH AT BEDTIME 90 tablet 0  . Respiratory Therapy Supplies (FLUTTER) DEVI Use as directed 1 each 0  . ROCKLATAN 0.02-0.005 % SOLN     . Selenium 200 MCG CAPS Take 200 mcg by mouth at bedtime.     . SYMBICORT 160-4.5 MCG/ACT inhaler INHALE 2 PUFFS BY MOUTH TWICE DAILY . 10.2 g 11  . temazepam (RESTORIL) 15 MG capsule TAKE 1 CAPSULE BY MOUTH AT BEDTIME AS NEEDED FOR SLEEP 30 capsule 5  . UNABLE TO FIND Fiber therapy    . VICTOZA 18 MG/3ML SOPN INJECT 1.8 MG INTO THE SKIN ONCE DAILY 9 mL 1  . vitamin B-12 (CYANOCOBALAMIN) 1000 MCG tablet Take 1,000 mcg by mouth daily.    [name] gabapentin (NEURONTIN) 100 MG capsule  Take 1 capsule (100 mg total) by mouth 3 (three) times daily as needed (neuropathic pain). 90 capsule 3   No current facility-administered medications for this visit.     Objective:  BP 138/74   Pulse 91   Temp 98.6 F (37 C) (Temporal)   Ht 6' (1.829 m)   Wt 249 lb (112.9 kg) Comment: per patient  LMP 05/10/1992 Comment: partial  SpO2 95%   BMI 33.77 kg/m  Gen: NAD, resting comfortably CV: RRR no murmurs rubs or gallops Lungs: CTAB no crackles, wheeze, rhonchi Abdomen: soft/nontender/nondistended/normal bowel sounds. No rebound or guarding.  Ext: no edema  Skin: warm, dry Neuro: grossly normal, moves all extremities  Pending UA No results found for this or any previous visit (from the past 24 hour(s)).     Assessment and Plan    #Concern for UTI S: Patients symptoms started  This morning.  Complains of dysuria: yes; polyuria: no; nocturia: yes; urgency: yes. Some chills with peeing.  Symptoms are stable since starting. Does have some fatigue.  ROS- no fever,  nausea, vomiting, flank pain. No blood in urine has had recent perirectal surgery and not healing well  A/P: also took a diflucan this AM and symptoms somewhat better. Will obtain UA . Likely UTI. Will get culture. Empiric treatment will hold off as she is concerned about possible repeat yeast infection.   Patient to follow up if new or worsening symptoms or failure to improve.   # Diabetes S: Medication:on victoza and glimepiride. Concerned about yeast issues on sglt2 inhibitor and gi effects on metformin. Does not want to start insulin.   CBGs- some variability but no lows. 123 yeaterday and 183 today Exercise and diet- has not been doing her bike pedaling- rectum issues have discouraged her. Has cut down on portion size of ice cream/kept this down.  Lab Results  Component Value Date   HGBA1C 7.6 (H) 09/24/2019   HGBA1C 7.8 (H) 06/18/2019   HGBA1C 7.5 (H) 10/04/2018   A/P: hopefully controlled- update  a1c -declines PT to help with mobility- encouraged her to use seated bike peddling exercises.  - some likely neuropathic pains in both feet and into 5th finger right hand- more tingling there. Try low dose gabapentin -overdoing tomato sandwiches with mayonaise  #hypertension S: medication: atenolol 25mg  daily and lisinopril Home readings #s: not checking BP Readings from Last 3 Encounters:  01/08/20 138/74  01/07/20 124/74  11/22/19 (!) 118/47  A/P: Stable. Continue current medications.   #hyperlipidemia/aortic atherosclerosis- incidental ct finding S: Medication:lovastatin every other day up from weekly Lab Results  Component Value Date   CHOL 167 06/18/2019   HDL 46.60 06/18/2019   LDLCALC 86 06/18/2019   LDLDIRECT 77.0 02/16/2016   TRIG 172.0 (H) 06/18/2019   CHOLHDL 4 06/18/2019   A/P: hopefully improved on lovastatin every other day. Hoping for LDL closer to 70.   # Depression S: Medication:fluoxetine 40mg   Depression screen [facility] 2/9 01/08/2020 09/24/2019 06/18/2019  Decreased Interest 3 3 3   Down, Depressed, Hopeless 3 3 3   PHQ - 2 Score 6 6 6   Altered sleeping 0 2 0  Tired, decreased energy 3 3 3   Change in appetite 3 0 0  Feeling bad or failure about yourself  0 0 0  Trouble concentrating 0 0 3  Moving slowly or fidgety/restless 0 0 0  Suicidal thoughts 0 0 0  PHQ-9 Score 12 11 12   Difficult doing work/chores Not difficult at all Somewhat difficult Not difficult at all  Some recent data might be hidden  A/P: partial remission only. Heat and delta variant made socialization less helpful.  Other ideas: card group not meeting. Declines counseling. Declines tweaking medicine.  - like the idea at least increasing exercise since dedlicning other treatments  Declines hep c screening  Recommended follow up: Return in about 14 weeks (around 04/15/2020) for follow up- or sooner if needed.g  Lab/Order associations:   ICD-10-CM   1. Controlled type 2 diabetes mellitus  without complication, without long-term current use of insulin (HCC)  E11.9 CBC With Differential/Platelet    COMPLETE METABOLIC PANEL WITH GFR  Hemoglobin A1c    Lipid Panel (Refl)  2. Dysuria  R30.0 POCT Urinalysis Dipstick (Automated)    Urine Culture  3. Hypertension associated with diabetes ([place])  E11.59    I10   4. Hyperlipidemia associated with type 2 diabetes mellitus ([place])  E11.69    E78.5   5. Recurrent major depression in partial remission (HCC)  F33.41   6. Aortic atherosclerosis (HCC)  I70.0     Meds ordered this encounter  Medications  . gabapentin (NEURONTIN) 100 MG capsule    Sig: Take 1 capsule (100 mg total) by mouth 3 (three) times daily as needed (neuropathic pain).    Dispense:  90 capsule    Refill:  3    Return precautions advised.  [name], MD

## 2020-01-08 ENCOUNTER — Encounter: Payer: Self-pay | Admitting: Family Medicine

## 2020-01-08 ENCOUNTER — Ambulatory Visit (INDEPENDENT_AMBULATORY_CARE_PROVIDER_SITE_OTHER): Payer: Medicare PPO | Admitting: Family Medicine

## 2020-01-08 ENCOUNTER — Other Ambulatory Visit: Payer: Self-pay

## 2020-01-08 VITALS — BP 138/74 | HR 91 | Temp 98.6°F | Ht 72.0 in | Wt 249.0 lb

## 2020-01-08 DIAGNOSIS — E1169 Type 2 diabetes mellitus with other specified complication: Secondary | ICD-10-CM

## 2020-01-08 DIAGNOSIS — E1159 Type 2 diabetes mellitus with other circulatory complications: Secondary | ICD-10-CM

## 2020-01-08 DIAGNOSIS — I700 Atherosclerosis of aorta: Secondary | ICD-10-CM

## 2020-01-08 DIAGNOSIS — E119 Type 2 diabetes mellitus without complications: Secondary | ICD-10-CM

## 2020-01-08 DIAGNOSIS — E785 Hyperlipidemia, unspecified: Secondary | ICD-10-CM

## 2020-01-08 DIAGNOSIS — F3341 Major depressive disorder, recurrent, in partial remission: Secondary | ICD-10-CM

## 2020-01-08 DIAGNOSIS — R300 Dysuria: Secondary | ICD-10-CM

## 2020-01-08 DIAGNOSIS — I152 Hypertension secondary to endocrine disorders: Secondary | ICD-10-CM

## 2020-01-08 DIAGNOSIS — I10 Essential (primary) hypertension: Secondary | ICD-10-CM | POA: Diagnosis not present

## 2020-01-08 LAB — POC URINALSYSI DIPSTICK (AUTOMATED)
Bilirubin, UA: NEGATIVE
Blood, UA: NEGATIVE
Glucose, UA: NEGATIVE
Ketones, UA: NEGATIVE
Nitrite, UA: POSITIVE
Protein, UA: POSITIVE — AB
Spec Grav, UA: >=1.03 — AB (ref 1.010–1.025)
Urobilinogen, UA: 0.2 E.U./dL
pH, UA: 5.5 (ref 5.0–8.0)

## 2020-01-08 MED ORDER — GABAPENTIN 100 MG PO CAPS
100.0000 mg | ORAL_CAPSULE | Freq: Three times a day (TID) | ORAL | 3 refills | Status: DC | PRN
Start: 2020-01-08 — End: 2020-07-30

## 2020-01-08 NOTE — Addendum Note (Signed)
Addended by: Liliane Channel on: 01/08/2020 02:02 PM   Modules accepted: Orders

## 2020-01-08 NOTE — Addendum Note (Signed)
Addended by: Doran Clay A on: 01/08/2020 02:11 PM   Modules accepted: Orders

## 2020-01-10 ENCOUNTER — Other Ambulatory Visit: Payer: Self-pay | Admitting: Physician Assistant

## 2020-01-10 ENCOUNTER — Telehealth: Payer: Self-pay

## 2020-01-10 ENCOUNTER — Telehealth: Payer: Self-pay | Admitting: Family Medicine

## 2020-01-10 LAB — COMPLETE METABOLIC PANEL WITH GFR
AG Ratio: 1.6 (calc) (ref 1.0–2.5)
ALT: 21 U/L (ref 6–29)
AST: 17 U/L (ref 10–35)
Albumin: 4.1 g/dL (ref 3.6–5.1)
Alkaline phosphatase (APISO): 89 U/L (ref 37–153)
BUN/Creatinine Ratio: 29 (calc) — ABNORMAL HIGH (ref 6–22)
BUN: 17 mg/dL (ref 7–25)
CO2: 29 mmol/L (ref 20–32)
Calcium: 9.8 mg/dL (ref 8.6–10.4)
Chloride: 104 mmol/L (ref 98–110)
Creat: 0.59 mg/dL — ABNORMAL LOW (ref 0.60–0.93)
GFR, Est African American: 101 mL/min/{1.73_m2} (ref >=60)
GFR, Est Non African American: 87 mL/min/{1.73_m2} (ref >=60)
Globulin: 2.5 g/dL (calc) (ref 1.9–3.7)
Glucose, Bld: 117 mg/dL — ABNORMAL HIGH (ref 65–99)
Potassium: 4.5 mmol/L (ref 3.5–5.3)
Sodium: 144 mmol/L (ref 135–146)
Total Bilirubin: 0.4 mg/dL (ref 0.2–1.2)
Total Protein: 6.6 g/dL (ref 6.1–8.1)

## 2020-01-10 LAB — CBC WITH DIFFERENTIAL/PLATELET
Absolute Monocytes: 965 cells/uL — ABNORMAL HIGH (ref 200–950)
Basophils Absolute: 53 cells/uL (ref 0–200)
Basophils Relative: 0.5 %
Eosinophils Absolute: 350 cells/uL (ref 15–500)
Eosinophils Relative: 3.3 %
HCT: 40.6 % (ref 35.0–45.0)
Hemoglobin: 13.2 g/dL (ref 11.7–15.5)
Lymphs Abs: 2215 cells/uL (ref 850–3900)
MCH: 30.8 pg (ref 27.0–33.0)
MCHC: 32.5 g/dL (ref 32.0–36.0)
MCV: 94.6 fL (ref 80.0–100.0)
MPV: 10 fL (ref 7.5–12.5)
Monocytes Relative: 9.1 %
Neutro Abs: 7017 cells/uL (ref 1500–7800)
Neutrophils Relative %: 66.2 %
Platelets: 287 10*3/uL (ref 140–400)
RBC: 4.29 10*6/uL (ref 3.80–5.10)
RDW: 12.8 % (ref 11.0–15.0)
Total Lymphocyte: 20.9 %
WBC: 10.6 10*3/uL (ref 3.8–10.8)

## 2020-01-10 LAB — URINE CULTURE
MICRO NUMBER:: 10893914
SPECIMEN QUALITY:: ADEQUATE

## 2020-01-10 LAB — HEMOGLOBIN A1C
Hgb A1c MFr Bld: 7.5 % of total Hgb — ABNORMAL HIGH (ref <5.7)
Mean Plasma Glucose: 169 (calc)
eAG (mmol/L): 9.3 (calc)

## 2020-01-10 LAB — LIPID PANEL (REFL)
Cholesterol: 159 mg/dL (ref <200)
HDL: 52 mg/dL (ref >=50)
LDL Cholesterol (Calc): 77 mg/dL (calc)
Non-HDL Cholesterol (Calc): 107 mg/dL (calc) (ref <130)
Total CHOL/HDL Ratio: 3.1 (calc) (ref <5.0)
Triglycerides: 199 mg/dL — ABNORMAL HIGH (ref <150)

## 2020-01-10 MED ORDER — SULFAMETHOXAZOLE-TRIMETHOPRIM 800-160 MG PO TABS: 1.0000 | ORAL_TABLET | Freq: Two times a day (BID) | ORAL | 0 refills | Status: DC

## 2020-01-10 NOTE — Telephone Encounter (Signed)
I am surprised culture is not back yet.  I did send in Bactrim for her to take for 5 days-we may have to change regimen depending on culture

## 2020-01-10 NOTE — Telephone Encounter (Signed)
Called patient reviewed information. She will start on meds now. Aware she may need to change once culture is back. Will call if any change in symptoms or questions.

## 2020-01-10 NOTE — Telephone Encounter (Signed)
Pt said Dr. Yong Channel said to call if her uti pain became unbearable. She states she is now miserable and asking for an antibiotic to be called in

## 2020-01-10 NOTE — Telephone Encounter (Signed)
..   LAST APPOINTMENT DATE: 01/10/2020   NEXT APPOINTMENT DATE:@12 /13/2021  MEDICATION:Fluconazole 150 mg tablet   [place], [address]  **Let patient know to contact pharmacy at the end of the day to make sure medication is ready. **  ** Please notify patient to allow 48-72 hours to process**  **Encourage patient to contact the pharmacy for refills or they can request refills through [facility]**  CLINICAL FILLS OUT ALL BELOW:   LAST REFILL:  QTY:  REFILL DATE:    OTHER COMMENTS: patient stated you prescribed her antibiotics for a UTI, Patient needs this medication for yeast infection she has history of when she takes antibiotics     Okay for refill?  Please advise

## 2020-01-11 MED ORDER — FLUCONAZOLE 150 MG PO TABS: 150.0000 mg | ORAL_TABLET | Freq: Once | ORAL | 0 refills | Status: AC

## 2020-01-11 NOTE — Telephone Encounter (Signed)
Spoke to pt told her Dr. Yong Channel has sent in [place] for you, but to wait to take until you are done with antibiotics. Pt verbalized understanding.

## 2020-01-11 NOTE — Telephone Encounter (Signed)
I sent in Diflucan for her-would wait to take this until after she has completed antibiotics

## 2020-01-11 NOTE — Telephone Encounter (Signed)
Ok to send in.  

## 2020-01-17 ENCOUNTER — Telehealth: Payer: Self-pay

## 2020-01-17 ENCOUNTER — Other Ambulatory Visit: Payer: Self-pay | Admitting: Family Medicine

## 2020-01-17 DIAGNOSIS — R3915 Urgency of urination: Secondary | ICD-10-CM

## 2020-01-17 NOTE — Telephone Encounter (Signed)
Pt has finished antibiotic and still experiencing urgency. Pt believes she may need another appt. She also now has a yeast infection from taking the antibitoic

## 2020-01-17 NOTE — Telephone Encounter (Signed)
Please check another urine culture under urinary urgency.   Please send in fluconazole 150 mg once daily number 1 tablet

## 2020-01-17 NOTE — Telephone Encounter (Signed)
See below

## 2020-01-18 MED ORDER — FLUCONAZOLE 150 MG PO TABS: 150.0000 mg | ORAL_TABLET | Freq: Once | ORAL | 0 refills | Status: AC

## 2020-01-18 NOTE — Telephone Encounter (Signed)
LVM asking patient to call the office back.  °

## 2020-01-25 ENCOUNTER — Other Ambulatory Visit: Payer: Self-pay

## 2020-01-25 DIAGNOSIS — R3915 Urgency of urination: Secondary | ICD-10-CM

## 2020-01-27 ENCOUNTER — Other Ambulatory Visit: Payer: Self-pay | Admitting: Family Medicine

## 2020-01-27 LAB — URINE CULTURE
MICRO NUMBER:: 10964709
SPECIMEN QUALITY:: ADEQUATE

## 2020-01-27 MED ORDER — SULFAMETHOXAZOLE-TRIMETHOPRIM 800-160 MG PO TABS
1.0000 | ORAL_TABLET | Freq: Two times a day (BID) | ORAL | 0 refills | Status: DC
Start: 2020-01-27 — End: 2020-02-20

## 2020-01-28 ENCOUNTER — Telehealth: Payer: Self-pay

## 2020-01-28 MED ORDER — FLUCONAZOLE 150 MG PO TABS: 150.0000 mg | ORAL_TABLET | Freq: Once | ORAL | 0 refills | Status: DC

## 2020-01-28 NOTE — Telephone Encounter (Signed)
I sent this in

## 2020-01-28 NOTE — Telephone Encounter (Signed)
Pt states she gets yeast infections when put on antibiotic, so she is asking for a medication to keep it away before it gets to bad

## 2020-01-29 NOTE — Telephone Encounter (Signed)
Called and spoke with pt and she is aware that Rx was sent in.

## 2020-01-30 ENCOUNTER — Other Ambulatory Visit: Payer: Self-pay | Admitting: Family Medicine

## 2020-01-31 ENCOUNTER — Other Ambulatory Visit: Payer: Self-pay | Admitting: Physician Assistant

## 2020-02-04 ENCOUNTER — Encounter (HOSPITAL_COMMUNITY): Payer: Self-pay | Admitting: Emergency Medicine

## 2020-02-04 ENCOUNTER — Emergency Department (HOSPITAL_COMMUNITY)
Admission: EM | Admit: 2020-02-04 | Discharge: 2020-02-05 | Disposition: A | Discharge status: Home / Self Care | Payer: Medicare PPO | Attending: Emergency Medicine | Admitting: Emergency Medicine

## 2020-02-04 ENCOUNTER — Other Ambulatory Visit: Payer: Self-pay

## 2020-02-04 DIAGNOSIS — Z96651 Presence of right artificial knee joint: Secondary | ICD-10-CM | POA: Insufficient documentation

## 2020-02-04 DIAGNOSIS — Z79899 Other long term (current) drug therapy: Secondary | ICD-10-CM | POA: Insufficient documentation

## 2020-02-04 DIAGNOSIS — Z7984 Long term (current) use of oral hypoglycemic drugs: Secondary | ICD-10-CM | POA: Diagnosis not present

## 2020-02-04 DIAGNOSIS — R1031 Right lower quadrant pain: Secondary | ICD-10-CM | POA: Diagnosis present

## 2020-02-04 DIAGNOSIS — Z853 Personal history of malignant neoplasm of breast: Secondary | ICD-10-CM | POA: Insufficient documentation

## 2020-02-04 DIAGNOSIS — J45909 Unspecified asthma, uncomplicated: Secondary | ICD-10-CM | POA: Insufficient documentation

## 2020-02-04 DIAGNOSIS — I10 Essential (primary) hypertension: Secondary | ICD-10-CM | POA: Insufficient documentation

## 2020-02-04 DIAGNOSIS — I700 Atherosclerosis of aorta: Secondary | ICD-10-CM | POA: Diagnosis not present

## 2020-02-04 DIAGNOSIS — M4186 Other forms of scoliosis, lumbar region: Secondary | ICD-10-CM | POA: Diagnosis not present

## 2020-02-04 DIAGNOSIS — E119 Type 2 diabetes mellitus without complications: Secondary | ICD-10-CM | POA: Insufficient documentation

## 2020-02-04 DIAGNOSIS — N200 Calculus of kidney: Secondary | ICD-10-CM | POA: Diagnosis not present

## 2020-02-04 DIAGNOSIS — K5730 Diverticulosis of large intestine without perforation or abscess without bleeding: Secondary | ICD-10-CM | POA: Diagnosis not present

## 2020-02-04 DIAGNOSIS — N390 Urinary tract infection, site not specified: Secondary | ICD-10-CM

## 2020-02-04 DIAGNOSIS — R1030 Lower abdominal pain, unspecified: Secondary | ICD-10-CM | POA: Diagnosis not present

## 2020-02-04 LAB — COMPREHENSIVE METABOLIC PANEL
ALT: 24 U/L (ref 0–44)
AST: 22 U/L (ref 15–41)
Albumin: 3.4 g/dL — ABNORMAL LOW (ref 3.5–5.0)
Alkaline Phosphatase: 84 U/L (ref 38–126)
Anion gap: 10 (ref 5–15)
BUN: 16 mg/dL (ref 8–23)
CO2: 24 mmol/L (ref 22–32)
Calcium: 9 mg/dL (ref 8.9–10.3)
Chloride: 102 mmol/L (ref 98–111)
Creatinine, Ser: 0.77 mg/dL (ref 0.44–1.00)
GFR calc Af Amer: >60 mL/min (ref >60)
GFR calc non Af Amer: >60 mL/min (ref >60)
Glucose, Bld: 247 mg/dL — ABNORMAL HIGH (ref 70–99)
Potassium: 4.7 mmol/L (ref 3.5–5.1)
Sodium: 136 mmol/L (ref 135–145)
Total Bilirubin: 0.6 mg/dL (ref 0.3–1.2)
Total Protein: 6.6 g/dL (ref 6.5–8.1)

## 2020-02-04 LAB — URINALYSIS, ROUTINE W REFLEX MICROSCOPIC
Bilirubin Urine: NEGATIVE
Glucose, UA: NEGATIVE mg/dL
Ketones, ur: NEGATIVE mg/dL
Nitrite: NEGATIVE
Protein, ur: 100 mg/dL — AB
Specific Gravity, Urine: 1.021 (ref 1.005–1.030)
WBC, UA: >50 WBC/hpf — ABNORMAL HIGH (ref 0–5)
pH: 5 (ref 5.0–8.0)

## 2020-02-04 LAB — CBC
HCT: 41.4 % (ref 36.0–46.0)
Hemoglobin: 13 g/dL (ref 12.0–15.0)
MCH: 30.2 pg (ref 26.0–34.0)
MCHC: 31.4 g/dL (ref 30.0–36.0)
MCV: 96.3 fL (ref 80.0–100.0)
Platelets: 291 10*3/uL (ref 150–400)
RBC: 4.3 MIL/uL (ref 3.87–5.11)
RDW: 13.6 % (ref 11.5–15.5)
WBC: 9.5 10*3/uL (ref 4.0–10.5)
nRBC: 0 % (ref 0.0–0.2)

## 2020-02-04 LAB — LIPASE, BLOOD: Lipase: 40 U/L (ref 11–51)

## 2020-02-04 NOTE — ED Notes (Signed)
Pt. Stated, My husband gave me 2 Tylenol about 5- 10 min. ago

## 2020-02-04 NOTE — ED Triage Notes (Addendum)
Onset today RLQ 0300 resolved and reappeared this morning 0800 radiating to middle lower abdomen denies flank pain. Currently taking antibiotics for a UTI. States might be a kidney stone.

## 2020-02-04 NOTE — ED Notes (Addendum)
Pt's husband asked that he be notified, when pt gets a room.[name].

## 2020-02-05 ENCOUNTER — Emergency Department (HOSPITAL_COMMUNITY): Payer: Medicare PPO

## 2020-02-05 DIAGNOSIS — K5730 Diverticulosis of large intestine without perforation or abscess without bleeding: Secondary | ICD-10-CM | POA: Diagnosis not present

## 2020-02-05 DIAGNOSIS — N200 Calculus of kidney: Secondary | ICD-10-CM | POA: Diagnosis not present

## 2020-02-05 DIAGNOSIS — M4186 Other forms of scoliosis, lumbar region: Secondary | ICD-10-CM | POA: Diagnosis not present

## 2020-02-05 DIAGNOSIS — I700 Atherosclerosis of aorta: Secondary | ICD-10-CM | POA: Diagnosis not present

## 2020-02-05 MED ORDER — HYDROCODONE-ACETAMINOPHEN 5-325 MG PO TABS: 0.5000 | ORAL_TABLET | ORAL | 0 refills | Status: DC | PRN

## 2020-02-05 MED ORDER — AMOXICILLIN-POT CLAVULANATE 875-125 MG PO TABS: 1.0000 | ORAL_TABLET | Freq: Two times a day (BID) | ORAL | 0 refills | Status: DC

## 2020-02-05 MED ORDER — AMOXICILLIN-POT CLAVULANATE 875-125 MG PO TABS
1.0000 | ORAL_TABLET | Freq: Once | ORAL | Status: AC
  Administered 2020-02-05: 1 via ORAL
  Filled 2020-02-05: qty 1

## 2020-02-05 NOTE — ED Notes (Signed)
Pt and husband was made aware of wait and was working on getting them back as soon as possible

## 2020-02-05 NOTE — ED Notes (Addendum)
Pt and her husband was telling another Pt in waiting room that RN and NT needed to do their job and check on pt that had already been checked on by NT and Triage Nurse.

## 2020-02-05 NOTE — ED Provider Notes (Signed)
[facility] Provider Note   CSN: [id number] Arrival date & time: 02/04/20  1121     History Chief Complaint  Patient presents with  . Abdominal Pain    [name] is a 79 y.o. female.  HPI 79 year old female presents with right lower quadrant abdominal pain that started around 3 AM on 9/27.  Later it seemed to progress to move towards her urethra and she thought she was having a kidney stone.  Pain has not gone away.  Whenever she takes 2 Tylenol it seems to relieve the pain but then the pain comes back.  Most recently took Tylenol 2 hours before I saw her and states she has no discomfort now.  No back pain.  She has been dealing with urinary tract infection with dysuria and frequency for about 2 weeks.  Is on her second course of Septra by her PCP.  She denies any fevers or vomiting though has felt a little nauseated. History of one prior kidney stone.   Past Medical History:  Diagnosis Date  . Allergy   . Anemia   . Anxiety   . Arthritis    right knee;injections every 70month   . Asthma   . Breast cancer ([place] 1994[place]   HX BREAST CANCER/ right brreast and left breast in 1995  . [place]chiectasis ([place]   . COMPRESSION FRACTURE, LUMBAR VERTEBRAE 08/21/2008   Qualifier: Diagnosis of  By: JArnoldo MoraleMD, JBalinda Quails  . Depression   . Diabetes mellitus    takes Amaryl and Januvia daily  . Difficulty sleeping   . Diverticulitis   . Early cataracts, bilateral   . Eczema   . Endometriosis   . Fatty liver   . Gastritis   . Glaucoma   . Hammer toe   . History of kidney stones   . Hyperlipidemia associated with type 2 diabetes mellitus ([facility] 07/26/2007   Diet/exercise control.    . Hypertension    takes Atenolol daily  . IBS (irritable bowel syndrome)   . Joint pain   . Migraine   . Neuropathy    BILATERAL FEET AND MID CALF  . Open wound of second toe of left foot in past   at pre-op appt, wound appears clear of infection 1 month post  removal of toenail, no obvious exudate present nor any redness,  . Osteomyelitis ([place]    left 2nd toe  . Osteopenia   . Perirectal fistula   . Pneumonia   . PONV (postoperative nausea and vomiting)   . Posterior tibial tendon dysfunction    left foot  . Restless leg syndrome   . Scoliosis   . Severe esophageal dysplasia   . 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 Thyroid disease   . Vitamin D deficiency    takes Vit d daily  . Weakness    uses a walker and wheelchair    Patient Active Problem List   Diagnosis Date Noted  . 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 11/22/2019  . Healthcare maintenance 02/22/2019  . Physical deconditioning 02/22/2019  . Shortness of breath 02/22/2019  . Adenomatous polyp 12/20/2018  . Pain due to onychomycosis of toenails of both feet 11/08/2018  . Diabetic neuropathy ([facility] 11/08/2018  . History of total knee replacement, bilateral 11/14/2017  . Personal history of colonic polyps 05/18/2017  . Arthritis of midfoot 03/03/2016  . Hammertoes of both feet 03/03/2016  .  Aortic atherosclerosis ([place]) 02/16/2016  . Solitary pulmonary nodule 01/08/2016  . 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  . Bronchiectasis ([place]) 10/18/2015  . IBS (irritable bowel syndrome) 07/17/2014  . Recurrent major depression in partial remission ([place]) 01/03/2014  . Posterior tibial tendon dysfunction 01/03/2014  . Osteoarthritis, knee 01/03/2014  . Glaucoma 01/03/2014  . Hypertension associated with diabetes ([place]) 01/03/2014  . Obesity (BMI 30-39.9) 06/15/2013  . Primary open-angle glaucoma 08/02/2012  . Foot tendinitis 07/20/2010  . INSOMNIA, CHRONIC 07/25/2009  . Fatty liver 03/18/2009  . GERD 12/25/2007  . Hyperlipidemia associated with type 2 diabetes mellitus ([place]) 07/26/2007  . ANEMIA, B12 DEFICIENCY 12/29/2006  . Diabetes mellitus type II, controlled ([place]) 12/28/2006  . RESTLESS LEG SYNDROME, MILD 12/28/2006  .  NEUROPATHY, IDIOPATHIC PERIPHERAL NEC 12/28/2006  . Osteoporosis 12/12/2006  . BREAST CANCER, HX OF 12/12/2006    Past Surgical History:  Procedure Laterality Date  . ADENOIDECTOMY     at age 82  . BIOPSY  12/19/2018   Procedure: BIOPSY;  Surgeon: Mauri Pole, MD;  Location: [facility];  Service: Endoscopy;;  . CATARACT EXTRACTION    . CHOLECYSTECTOMY  06/2008  . COLONOSCOPY WITH PROPOFOL N/A 05/17/2017   Procedure: COLONOSCOPY WITH PROPOFOL;  Surgeon: Mauri Pole, MD;  Location: [facility];  Service: Endoscopy;  Laterality: N/A;  PT WILL BE ADMITTED THE DAY BEFORE FOR PREP PER ROBIN KB  . COLONOSCOPY WITH PROPOFOL N/A 12/19/2018   Procedure: COLONOSCOPY WITH PROPOFOL;  Surgeon: Mauri Pole, MD;  Location: [facility];  Service: Endoscopy;  Laterality: N/A;  . DILATION AND CURETTAGE OF UTERUS    . excision on breast     internal infected suture from breast surgery  . excision removed from neck     infected lymph node  . FOOT SURGERY     left foot-cleaning out areas on toes at the wound center-having MRI to determine if osteomyelitis  . HEMORRHOID SURGERY N/A 11/22/2019   Procedure: HEMORRHOIDECTOMY LIGATION , PEXY;  Surgeon: Michael Boston, MD;  Location: [facility];  Service: General;  Laterality: N/A;  . HYPERBARIC OXYGEN THERAPY     FEET INFECTION  . HYSTEROSCOPY  06/22/11   PMB submucosal myoma  . JOINT REPLACEMENT     left total shoulder  . LAPAROSCOPIC OOPHORECTOMY Right 12/2004   absent LSO  . LAPAROTOMY    . LIGATION OF INTERNAL FISTULA TRACT N/A 11/22/2019   Procedure: REPAIR OF PERIRECTAL FISTULA, ANORECTAL EXAMINATION UNDER ANESTHESIA;  Surgeon: Michael Boston, MD;  Location: [facility];  Service: General;  Laterality: N/A;  . MASTECTOMY Bilateral 1994 right, 1995 left  . POLYPECTOMY  12/19/2018   Procedure: POLYPECTOMY;  Surgeon: Mauri Pole, MD;  [facility];  Service: Endoscopy;;  . SHOULDER  HEMI-ARTHROPLASTY  01/01/2012   Procedure: SHOULDER HEMI-ARTHROPLASTY;  Surgeon: Roseanne Kaufman, MD;  Location: [facility];  Service: Orthopedics;  Laterality: Left;  Left Shoulder Hemi Arthroplasty with Repair and Reconstruction as Necessary   . THYROIDECTOMY     27yr ago. follows endocrine  . TONSILLECTOMY    . TOTAL KNEE ARTHROPLASTY Right 12/30/2014   Procedure: RIGHT TOTAL KNEE ARTHROPLASTY;  Surgeon: FGaynelle Arabian MD;  [facility];  Service: Orthopedics;  Laterality: Right;  . TOTAL KNEE ARTHROPLASTY Left 11/29/2016   Procedure: LEFT TOTAL KNEE ARTHROPLASTY;  Surgeon: AGaynelle Arabian MD;  Location[facility];  Service: Orthopedics;  Laterality: Left;     OB History    Gravida  0   Para  Term      Preterm      AB      Living        SAB      TAB      Ectopic      Multiple      Live Births           Obstetric Comments  1 adopted        Family History  Problem Relation Age of Onset  . Arthritis Mother   . COPD Father   . Heart disease Father        MI 42 - states he died of lockjaw  . Hypertension Father   . Hyperlipidemia Father   . Pancreatic cancer Paternal Grandfather   . Colon cancer Neg Hx   . Esophageal cancer Neg Hx   . Rectal cancer Neg Hx   . Stomach cancer Neg Hx     Social History   Tobacco Use  . Smoking status: Never Smoker  . Smokeless tobacco: Never Used  Vaping Use  . Vaping Use: Never used  Substance Use Topics  . Alcohol use: No  . Drug use: No    Home Medications Prior to Admission medications   Medication Sig Start Date End Date Taking? Authorizing Provider  amoxicillin-clavulanate (AUGMENTIN) 875-125 MG tablet Take 1 tablet by mouth 2 (two) times daily. One po bid x 7 days 02/05/20   Sherwood Gambler, MD  atenolol (TENORMIN) 25 MG tablet Take 1 tablet by mouth once daily 11/29/19   Marin Olp, MD  Bacillus Coagulans-Inulin (ALIGN PREBIOTIC-PROBIOTIC PO) Take 1 capsule by mouth daily.     [provider]    blood glucose meter kit and supplies KIT Dispense based on patient and insurance preference. Use up to four times daily as directed. Dx E11.9 08/21/19   Marin Olp, MD  cetirizine (ZYRTEC) 10 MG tablet Take 10 mg by mouth every morning.     [provider]  Cholecalciferol (VITAMIN D3) 125 MCG (5000 UT) TABS Take 5,000 Units by mouth daily.    [provider]  Coenzyme Q10 (COQ10) 200 MG CAPS Take 200 mg by mouth daily.    [provider]  colesevelam (WELCHOL) 625 MG tablet Take 1 tablet (625 mg total) by mouth in the morning, at noon, and at bedtime. Patient taking differently: Take 625 mg by mouth 2 (two) times daily with a meal.  08/24/19   Esterwood, Amy S, PA-C  dicyclomine (BENTYL) 10 MG capsule TAKE 1 CAPSULE BY MOUTH THREE TIMES DAILY BEFORE MEAL(S) 02/01/20   Esterwood, Amy S, PA-C  dorzolamide-timolol (COSOPT) 22.3-6.8 MG/ML ophthalmic solution Place 1 drop into both eyes 2 (two) times daily.  02/08/13   [provider]  ESTRING 2 MG vaginal ring INSERT 1 RING  INTO VAGINA EVERY 3 MONTHS 11/21/18   Megan Salon, MD  FLUoxetine (PROZAC) 40 MG capsule Take 1 capsule by mouth once daily 01/31/20   Marin Olp, MD  fluticasone [facility]) 50 MCG/ACT nasal spray Place 2 sprays into both nostrils daily. Patient taking differently: Place 2 sprays into both nostrils every morning.  10/25/19   Julian Hy, DO  gabapentin (NEURONTIN) 100 MG capsule Take 1 capsule (100 mg total) by mouth 3 (three) times daily as needed (neuropathic pain). 01/08/20   Marin Olp, MD  glimepiride (AMARYL) 4 MG tablet TAKE 2 TABLETS BY MOUTH ONCE DAILY WITH BREAKFAST 01/31/20   Marin Olp, MD  glucose blood (ACCU-CHEK GUIDE) test strip Use to test blood sugars daily. Dx: E11.9 10/18/19   Marin Olp, MD  glucose blood test strip Use to test up to twice a day 10/18/19   Marin Olp, MD  HYDROcodone-acetaminophen (NORCO) 5-325 MG tablet Take 0.5 tablets  by mouth every 4 (four) hours as needed for severe pain. 02/05/20   Sherwood Gambler, MD  hydrocortisone 2.5 % cream Apply 1 application topically 2 (two) times daily as needed (skin irritation/rash).  08/10/18   [provider]  ketoconazole (NIZORAL) 2 % cream as needed.  04/27/19   [provider]  Lancets Misc. (ACCU-CHEK FASTCLIX LANCET) KIT Use to test blood sugars daily. Dx: E11.9 08/23/19   Marin Olp, MD  latanoprost (XALATAN) 0.005 % ophthalmic solution Place 1 drop into both eyes at bedtime. 05/20/18   [provider]  lisinopril (ZESTRIL) 10 MG tablet Take 1 tablet by mouth once daily 01/31/20   Marin Olp, MD  lovastatin (MEVACOR) 10 MG tablet Take 1 tablet (10 mg total) by mouth every other day. 09/26/19   Marin Olp, MD  Misc Natural Products (NEURIVA PO) Take by mouth.    [provider]  Multiple Vitamin (MULTIVITAMIN WITH MINERALS) TABS tablet Take 1 tablet by mouth daily. Centrum Silver    [provider]  pramipexole (MIRAPEX) 0.5 MG tablet TAKE 1 TABLET BY MOUTH AT BEDTIME 01/17/20   Marin Olp, MD  Respiratory Therapy Supplies (FLUTTER) DEVI Use as directed 01/08/16   Juanito Doom, MD  ROCKLATAN 0.02-0.005 % SOLN  01/02/20   [provider]  Selenium 200 MCG CAPS Take 200 mcg by mouth at bedtime.     [provider]  sulfamethoxazole-trimethoprim (BACTRIM DS) 800-160 MG tablet Take 1 tablet by mouth 2 (two) times daily. 01/27/20   Marin Olp, MD  SYMBICORT 160-4.5 MCG/ACT inhaler INHALE 2 PUFFS BY MOUTH TWICE DAILY . 10/25/19   Noemi Chapel P, DO  temazepam (RESTORIL) 15 MG capsule TAKE 1 CAPSULE BY MOUTH AT BEDTIME AS NEEDED FOR SLEEP 01/01/20   Marin Olp, MD  UNABLE TO FIND Fiber therapy    [provider]  VICTOZA 18 MG/3ML SOPN INJECT 1.8 MG INTO THE SKIN ONCE DAILY 12/19/19   Marin Olp, MD  vitamin B-12 (CYANOCOBALAMIN) 1000 MCG tablet Take 1,000 mcg by mouth  daily.    [provider]    Allergies    Nitrofurantoin, Levaquin [levofloxacin in d5w], Brimonidine, Cephalexin, Erythromycin ethylsuccinate, and Oxycodone  Review of Systems   Review of Systems  Constitutional: Negative for fever.  Gastrointestinal: Positive for abdominal pain and nausea. Negative for vomiting.  Genitourinary: Positive for dysuria and frequency.  Musculoskeletal: Negative for back pain.  All other systems reviewed and are negative.   Physical Exam Updated Vital Signs BP 123/61 (BP Location: Right Wrist)   Pulse (!) 102   Temp 98.2 F (36.8 C) (Oral)   Resp (!) 22   Wt 115.7 kg   LMP 05/10/1992 Comment: partial  SpO2 96%   BMI 34.58 kg/m   Physical Exam Vitals and nursing note reviewed.  Constitutional:      General: She is not in acute distress.    Appearance: She is well-developed. She is obese. She is not ill-appearing or diaphoretic.  HENT:     Head: Normocephalic and atraumatic.     Right Ear: External ear normal.     Left Ear: External ear normal.  Nose: Nose normal.  Eyes:     General:        Right eye: No discharge.        Left eye: No discharge.  Cardiovascular:     Rate and Rhythm: Normal rate and regular rhythm.     Heart sounds: Normal heart sounds.  Pulmonary:     Effort: Pulmonary effort is normal.     Breath sounds: Normal breath sounds.  Abdominal:     Palpations: Abdomen is soft.     Tenderness: There is no abdominal tenderness. There is no right CVA tenderness or left CVA tenderness.  Skin:    General: Skin is warm and dry.  Neurological:     Mental Status: She is alert.  Psychiatric:        Mood and Affect: Mood is not anxious.     ED Results / Procedures / Treatments   Labs (all labs ordered are listed, but only abnormal results are displayed) Labs Reviewed  COMPREHENSIVE METABOLIC PANEL - Abnormal; Notable for the following components:      Result Value   Glucose, Bld 247 (*)    Albumin 3.4 (*)     All other components within normal limits  URINALYSIS, ROUTINE W REFLEX MICROSCOPIC - Abnormal; Notable for the following components:   APPearance TURBID (*)    Hgb urine dipstick LARGE (*)    Protein, ur 100 (*)    Leukocytes,Ua SMALL (*)    WBC, UA >50 (*)    Bacteria, UA FEW (*)    All other components within normal limits  URINE CULTURE  LIPASE, BLOOD  CBC    EKG None  Radiology CT Renal Stone Study  Result Date: 02/05/2020 CLINICAL DATA:  Right lower quadrant abdominal pain and flank pain. Currently on antibiotics for urinary tract infection. EXAM: CT ABDOMEN AND PELVIS WITHOUT CONTRAST TECHNIQUE: Multidetector CT imaging of the abdomen and pelvis was performed following the standard protocol without IV contrast. COMPARISON:  MRI abdomen 08/20/2019. CT abdomen and pelvis 07/18/2019 FINDINGS: Lower chest: The lung bases are clear. Hepatobiliary: Focal areas of low attenuation in segments 1 and 4 of the liver consistent with focal fatty infiltration as demonstrated on prior MRI. Surgical absence of the gallbladder. No bile duct dilatation. Pancreas: Unremarkable. No pancreatic ductal dilatation or surrounding inflammatory changes. Spleen: Normal in size without focal abnormality. Adrenals/Urinary Tract: No adrenal gland nodules. Stone in the lower pole right kidney measuring 3 mm diameter. No hydronephrosis or hydroureter. No ureteral stones or bladder stones. The bladder is normal. Stomach/Bowel: Stomach, small bowel, and colon are mostly decompressed. Scattered stool in the colon. Appendix is normal. Diverticula in the sigmoid colon without evidence of diverticulitis. Vascular/Lymphatic: Aortic atherosclerosis. No enlarged abdominal or pelvic lymph nodes. Reproductive: Uterus and bilateral adnexa are unremarkable. A vaginal pessary is in place. Other: No free air or free fluid in the abdomen. Abdominal wall musculature appears intact. Musculoskeletal: Mild lumbar scoliosis. Diffuse  degenerative change in the lumbar spine. Old appearing anterior compression of L4. No destructive bone lesions. IMPRESSION: 1. No acute process demonstrated in the abdomen or pelvis. No evidence of bowel obstruction or inflammation. 2. Nonobstructing stone in the lower pole right kidney. No ureteral stone, hydronephrosis, or hydroureter. 3. Diverticulosis of the sigmoid colon without evidence of diverticulitis. 4. Focal fatty infiltration of the liver. 5. Aortic atherosclerosis. 6. Old appearing anterior compression of L4. Aortic Atherosclerosis (ICD10-I70.0). Electronically Signed   By: Lucienne Capers M.D.   On: 02/05/2020 03:26  Procedures Procedures (including critical care time)  Medications Ordered in ED Medications  amoxicillin-clavulanate (AUGMENTIN) 875-125 MG per tablet 1 tablet (has no administration in time range)    ED Course  I have reviewed the triage vital signs and the nursing notes.  Pertinent labs & imaging results that were available during my care of the patient were reviewed by me and considered in my medical decision making (see chart for details).    MDM Rules/Calculators/A&P                          Right now, patient's exam is benign.  CT has been personally reviewed and shows no obvious ureteral stone or obstructing stone.  It is possible she has a stone not seen versus a recently passed stone.  Either way I think pain control would be warranted.  She does want to try a small dose of Norco at home.  Her urinalysis is concerning for UTI.  Urine culture from 9/17 shows that should have been susceptible to Bactrim but will change to Augmentin as she has tolerated this in the past and the Septra is not working.  Follow-up with PCP. Final Clinical Impression(s) / ED Diagnoses Final diagnoses:  Lower abdominal pain  Acute urinary tract infection    Rx / DC Orders ED Discharge Orders         Ordered    HYDROcodone-acetaminophen (NORCO) 5-325 MG tablet  Every 4  hours PRN        02/05/20 0801    amoxicillin-clavulanate (AUGMENTIN) 875-125 MG tablet  2 times daily        02/05/20 0801           Sherwood Gambler, MD 02/05/20 [phone]

## 2020-02-05 NOTE — Discharge Instructions (Addendum)
If you develop worsening, continued, or recurrent abdominal pain, uncontrolled vomiting, fever, chest or back pain, or any other new/concerning symptoms then return to the ER for evaluation.  

## 2020-02-06 LAB — URINE CULTURE

## 2020-02-08 ENCOUNTER — Other Ambulatory Visit: Payer: Self-pay | Admitting: Family Medicine

## 2020-02-19 ENCOUNTER — Telehealth: Payer: Self-pay | Admitting: Physician Assistant

## 2020-02-19 NOTE — Telephone Encounter (Signed)
Patient has complaints of urgent bowel movements with any PO intake. She is wearing diapers because sometimes she cannot get to the bathroom quickly enough. She denies fever or abdominal pain. Bowel movements are not liquid, but are very soft and unformed. Appointment scheduled.

## 2020-02-19 NOTE — Telephone Encounter (Signed)
Pt called looking to be seen asap, She stated that medications are not working and she has been having  flare ups more often for about a month . Pls call her.

## 2020-02-20 ENCOUNTER — Ambulatory Visit: Payer: Medicare PPO | Admitting: Physician Assistant

## 2020-02-20 ENCOUNTER — Other Ambulatory Visit: Payer: Self-pay

## 2020-02-20 ENCOUNTER — Telehealth: Payer: Self-pay

## 2020-02-20 VITALS — BP 119/64 | HR 92 | Ht 72.0 in | Wt 248.0 lb

## 2020-02-20 DIAGNOSIS — R12 Heartburn: Secondary | ICD-10-CM | POA: Diagnosis not present

## 2020-02-20 DIAGNOSIS — K580 Irritable bowel syndrome with diarrhea: Secondary | ICD-10-CM

## 2020-02-20 DIAGNOSIS — R3915 Urgency of urination: Secondary | ICD-10-CM

## 2020-02-20 MED ORDER — DICYCLOMINE HCL 20 MG PO TABS: 20.0000 mg | ORAL_TABLET | Freq: Four times a day (QID) | ORAL | 5 refills | Status: DC

## 2020-02-20 MED ORDER — FAMOTIDINE 20 MG PO TABS: 20.0000 mg | ORAL_TABLET | Freq: Every day | ORAL | Status: DC | PRN

## 2020-02-20 NOTE — Telephone Encounter (Signed)
Can do urine culture only. Last culture did not show true UTI. We should have results back in a few days. If this is negative and she still has symptoms can refer to urology

## 2020-02-20 NOTE — Telephone Encounter (Signed)
Urine culture ordered, ok to call pt and schedule lab.

## 2020-02-20 NOTE — Patient Instructions (Addendum)
If you are age 80 or older, your body mass index should be between 23-30. Your Body mass index is 33.63 kg/m. If this is out of the aforementioned range listed, please consider follow up with your Primary Care Provider.  If you are age 71 or younger, your body mass index should be between 19-25. Your Body mass index is 33.63 kg/m. If this is out of the aformentioned range listed, please consider follow up with your Primary Care Provider.   We have sent the following medications to your pharmacy for you to pick up at your convenience:  INCREASE: dicyclomine to 20mg  one tablet four times daily 20 to 30 minutes before meals and at bedtime.  CONTINUE: Welchol  Please purchase the following medications over the counter and take as directed:  You can use Pepcid as needed for heartburn.  You are scheduled to follow up with Dr Silverio Decamp on 04-14-20 at 3:30pm.  Thank you for entrusting me with your care and choosing [facility].  Ellouise Newer, [place]

## 2020-02-20 NOTE — Telephone Encounter (Signed)
Pt states she has had 2 rounds of antibiotics for a UTI and is still having problems. She is asking if she can come in for another urinalysis to be analyzed.

## 2020-02-20 NOTE — Progress Notes (Signed)
Chief Complaint: Encoperesis and gas  HPI:    [name] is an 79 year old female with a past medical history as listed below, known to Dr. Fuller Plan, who presents clinic today with a complaint of fecal incontinence and gas.    01/26/2017 patient seen in clinic meet with me for IBS-D.  She was changed from Lomotil to Motofen 1 tab every 4 hours and we discussed dicyclomine addition if that did not work.    05/25/2017 patient given Colestid 2 g twice daily to see if this help with diarrhea.    08/06/2019 patient seen in clinic by [facility] after she had recently been seen in the ER and started on antibiotics for diverticulitis.  At that time discussed a colonoscopy in August 2020 with 4 small polyps, path consistent with tubular adenomas noted to have left colon and sigmoid colon diverticuli.  At that time complained of a "knot" that was painful between the rectum and vagina and a low-grade temp.  She was started on Augmentin 875 mg p.o. twice daily and Diflucan.  Also schedule an appointment [facility].    Today, the patient presents to clinic and tells me that her diarrhea was somewhat under control with WelChol and Dicyclomine 10 mg before meals, typically she would end up taking this just twice a day as she only has 2 meals a day typically.  This was working well for her until the past 6 months or so when she started again with urgent stools.  These are somewhat unpredictable, sometimes she can go out to lunch after taking her medicine and have no problem at other times she is incontinent of stool on the way back home.  Describes that currently she is being treated for UTI and is on her second round of antibiotics.  Does tell me though that stools are not liquid they are still formed just soft and she has no control over them.    Also describes some heartburn which occurs only intermittently, not even every week, maybe 1-2 times a month.  She typically just waits for it to go away, she has never tried anything  over-the-counter.    Denies fever, chills or blood in her stool.     Past Medical History:  Diagnosis Date  . Allergy   . Anemia   . Anxiety   . Arthritis    right knee;injections every 48months   . Asthma   . Breast cancer ([place]) 1994/1995   HX BREAST CANCER/ right brreast and left breast in 1995  . Bronchiectasis ([place])   . COMPRESSION FRACTURE, LUMBAR VERTEBRAE 08/21/2008   Qualifier: Diagnosis of  By: Arnoldo Morale MD, Balinda Quails   . Depression   . Diabetes mellitus    takes Amaryl and Januvia daily  . Difficulty sleeping   . Diverticulitis   . Early cataracts, bilateral   . Eczema   . Endometriosis   . Fatty liver   . Gastritis   . Glaucoma   . Hammer toe   . History of kidney stones   . Hyperlipidemia associated with type 2 diabetes mellitus ([place]) 07/26/2007   Diet/exercise control.    . Hypertension    takes Atenolol daily  . IBS (irritable bowel syndrome)   . Joint pain   . Migraine   . Neuropathy    BILATERAL FEET AND MID CALF  . Open wound of second toe of left foot in past   at pre-op appt, wound appears clear of infection 1 month post removal  of toenail, no obvious exudate present nor any redness,  . Osteomyelitis ([place])    left 2nd toe  . Osteopenia   . Perirectal fistula   . Pneumonia   . PONV (postoperative nausea and vomiting)   . Posterior tibial tendon dysfunction    left foot  . Restless leg syndrome   . Scoliosis   . Severe esophageal dysplasia   . 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 Thyroid disease   . Vitamin D deficiency    takes Vit d daily  . Weakness    uses a walker and wheelchair    Past Surgical History:  Procedure Laterality Date  . ADENOIDECTOMY     at age 10  . BIOPSY  12/19/2018   Procedure: BIOPSY;  Surgeon: Mauri Pole, MD;  Location: [facility];  Service: Endoscopy;;  . CATARACT EXTRACTION    . CHOLECYSTECTOMY  06/2008  . COLONOSCOPY WITH PROPOFOL N/A 05/17/2017   Procedure:  COLONOSCOPY WITH PROPOFOL;  Surgeon: Mauri Pole, MD;  Location: [facility];  Service: Endoscopy;  Laterality: N/A;  PT WILL BE ADMITTED THE DAY BEFORE FOR PREP PER ROBIN KB  . COLONOSCOPY WITH PROPOFOL N/A 12/19/2018   Procedure: COLONOSCOPY WITH PROPOFOL;  Surgeon: Mauri Pole, MD;  Location: [facility];  Service: Endoscopy;  Laterality: N/A;  . DILATION AND CURETTAGE OF UTERUS    . excision on breast     internal infected suture from breast surgery  . excision removed from neck     infected lymph node  . FOOT SURGERY     left foot-cleaning out areas on toes at the wound center-having MRI to determine if osteomyelitis  . HEMORRHOID SURGERY N/A 11/22/2019   Procedure: HEMORRHOIDECTOMY LIGATION , PEXY;  Surgeon: Michael Boston, MD;  Location: [facility];  Service: General;  Laterality: N/A;  . HYPERBARIC OXYGEN THERAPY     FEET INFECTION  . HYSTEROSCOPY  06/22/11   PMB submucosal myoma  . JOINT REPLACEMENT     left total shoulder  . LAPAROSCOPIC OOPHORECTOMY Right 12/2004   absent LSO  . LAPAROTOMY    . LIGATION OF INTERNAL FISTULA TRACT N/A 11/22/2019   Procedure: REPAIR OF PERIRECTAL FISTULA, ANORECTAL EXAMINATION UNDER ANESTHESIA;  Surgeon: Michael Boston, MD;  Location: [facility];  Service: General;  Laterality: N/A;  . MASTECTOMY Bilateral 1994 right, 1995 left  . POLYPECTOMY  12/19/2018   Procedure: POLYPECTOMY;  Surgeon: Mauri Pole, MD;  [facility];  Service: Endoscopy;;  . SHOULDER HEMI-ARTHROPLASTY  01/01/2012   Procedure: SHOULDER HEMI-ARTHROPLASTY;  Surgeon: Roseanne Kaufman, MD;  Location: [facility];  Service: Orthopedics;  Laterality: Left;  Left Shoulder Hemi Arthroplasty with Repair and Reconstruction as Necessary   . THYROIDECTOMY     19yrs ago. follows endocrine  . TONSILLECTOMY    . TOTAL KNEE ARTHROPLASTY Right 12/30/2014   Procedure: RIGHT TOTAL KNEE ARTHROPLASTY;  Surgeon: Gaynelle Arabian, MD;  [facility];  Service: Orthopedics;  Laterality: Right;  . TOTAL KNEE ARTHROPLASTY Left 11/29/2016   Procedure: LEFT TOTAL KNEE ARTHROPLASTY;  Surgeon: Gaynelle Arabian, MD;  [facility];  Service: Orthopedics;  Laterality: Left;    Current Outpatient Medications  Medication Sig Dispense Refill  . atenolol (TENORMIN) 25 MG tablet Take 1 tablet by mouth once daily 90 tablet 0  . Bacillus Coagulans-Inulin (ALIGN PREBIOTIC-PROBIOTIC PO) Take 1 capsule by mouth daily.     . blood glucose  meter kit and supplies KIT Dispense based on patient and insurance preference. Use up to four times daily as directed. Dx E11.9 1 each 0  . cetirizine (ZYRTEC) 10 MG tablet Take 10 mg by mouth every morning.     . Cholecalciferol (VITAMIN D3) 125 MCG (5000 UT) TABS Take 5,000 Units by mouth daily.    . Coenzyme Q10 (COQ10) 200 MG CAPS Take 200 mg by mouth daily.    . colesevelam (WELCHOL) 625 MG tablet Take 1 tablet (625 mg total) by mouth in the morning, at noon, and at bedtime. (Patient taking differently: Take 625 mg by mouth 2 (two) times daily with a meal. ) 90 tablet 3  . dicyclomine (BENTYL) 10 MG capsule TAKE 1 CAPSULE BY MOUTH THREE TIMES DAILY BEFORE MEAL(S) 90 capsule 3  . dorzolamide-timolol (COSOPT) 22.3-6.8 MG/ML ophthalmic solution Place 1 drop into both eyes 2 (two) times daily.     [name] ESTRING 2 MG vaginal ring INSERT 1 RING  INTO VAGINA EVERY 3 MONTHS 1 each 3  . FLUoxetine (PROZAC) 40 MG capsule Take 1 capsule by mouth once daily 90 capsule 0  . fluticasone (FLONASE) 50 MCG/ACT nasal spray Place 2 sprays into both nostrils daily. (Patient taking differently: Place 2 sprays into both nostrils every morning. ) 16 g 11  . gabapentin (NEURONTIN) 100 MG capsule Take 1 capsule (100 mg total) by mouth 3 (three) times daily as needed (neuropathic pain). 90 capsule 3  . glimepiride (AMARYL) 4 MG tablet TAKE 2 TABLETS BY MOUTH ONCE DAILY WITH BREAKFAST 180 tablet 0  . glucose blood (ACCU-CHEK GUIDE) test strip Use  to test blood sugars daily. Dx: E11.9 100 each 6  . glucose blood test strip Use to test up to twice a day 200 each 1  . hydrocortisone 2.5 % cream Apply 1 application topically 2 (two) times daily as needed (skin irritation/rash).     . Lancets Misc. (ACCU-CHEK FASTCLIX LANCET) KIT Use to test blood sugars daily. Dx: E11.9 1 kit 5  . latanoprost (XALATAN) 0.005 % ophthalmic solution Place 1 drop into both eyes at bedtime.    [name] lisinopril (ZESTRIL) 10 MG tablet Take 1 tablet by mouth once daily 90 tablet 0  . lovastatin (MEVACOR) 10 MG tablet Take 1 tablet (10 mg total) by mouth every other day. 45 tablet 3  . Misc Natural Products (NEURIVA PO) Take by mouth.    . Multiple Vitamin (MULTIVITAMIN WITH MINERALS) TABS tablet Take 1 tablet by mouth daily. Centrum Silver    . pramipexole (MIRAPEX) 0.5 MG tablet TAKE 1 TABLET BY MOUTH AT BEDTIME 90 tablet 0  . Respiratory Therapy Supplies (FLUTTER) DEVI Use as directed 1 each 0  . ROCKLATAN 0.02-0.005 % SOLN     . Selenium 200 MCG CAPS Take 200 mcg by mouth at bedtime.     . SYMBICORT 160-4.5 MCG/ACT inhaler INHALE 2 PUFFS BY MOUTH TWICE DAILY . 10.2 g 11  . temazepam (RESTORIL) 15 MG capsule TAKE 1 CAPSULE BY MOUTH AT BEDTIME AS NEEDED FOR SLEEP 30 capsule 5  . UNABLE TO FIND Fiber therapy    . VICTOZA 18 MG/3ML SOPN INJECT 1.$RemoveBefor'8MG'[id number]$  INTO THE  SKIN ONCE DAILY 9 mL 0  . vitamin B-12 (CYANOCOBALAMIN) 1000 MCG tablet Take 1,000 mcg by mouth daily.     No current facility-administered medications for this visit.    Allergies as of 02/20/2020 - Review Complete 02/20/2020  Allergen Reaction Noted  . Nitrofurantoin Other (See Comments) 10/14/2006  .  Levaquin [levofloxacin in d5w] Other (See Comments) 12/18/2014  . Brimonidine Other (See Comments) 08/02/2012  . Cephalexin Diarrhea and Other (See Comments) 10/14/2006  . Erythromycin ethylsuccinate Hives and Diarrhea 10/14/2006  . Oxycodone Itching 09/11/2019    Family History  Problem Relation Age of  Onset  . Arthritis Mother   . COPD Father   . Heart disease Father        MI 65 - states he died of lockjaw  . Hypertension Father   . Hyperlipidemia Father   . Pancreatic cancer Paternal Grandfather   . Colon cancer Neg Hx   . Esophageal cancer Neg Hx   . Rectal cancer Neg Hx   . Stomach cancer Neg Hx     Social History   Socioeconomic History  . Marital status: Married    Spouse name: Not on file  . Number of children: 1  . Years of education: Not on file  . Highest education level: Not on file  Occupational History  . Occupation: retired  Tobacco Use  . Smoking status: Never Smoker  . Smokeless tobacco: Never Used  Vaping Use  . Vaping Use: Never used  Substance and Sexual Activity  . Alcohol use: No  . Drug use: No  . Sexual activity: Not Currently    Partners: Male    Birth control/protection: Post-menopausal, Surgical    Comment: partial hysterectomy  Other Topics Concern  . Not on file  Social History Narrative   Married 39 years in 2015. 1 adopted son. 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 Difficulty of Paying Living Expenses: Not on file  Food Insecurity:   . Worried About Programme researcher, broadcasting/film/video in the Last Year: Not on file  . Ran Out of Food in the Last Year: Not on file  Transportation Needs:   . Lack of Transportation (Medical): Not on file  . Lack of Transportation (Non-Medical): Not on file  Physical Activity:   . Days of Exercise per Week: Not on file  . Minutes of Exercise per Session: Not on file  Stress:   . Feeling of Stress : Not on file  Social Connections:   . Frequency of Communication with Friends and Family: Not on file  . Frequency of Social Gatherings with Friends and Family: Not on file  . Attends Religious Services: Not on file  . Active Member of Clubs or Organizations: Not on file  .  Attends Banker Meetings: Not on file  . Marital Status: Not on file  Intimate Partner Violence:   . Fear of Current or Ex-Partner: Not on file  . Emotionally Abused: Not on file  . Physically Abused: Not on file  . Sexually Abused: Not on file    Review of Systems:    Constitutional: No weight loss, fever or chills Cardiovascular: No chest pain Respiratory: No SOB  Gastrointestinal: See HPI and otherwise negative   Physical Exam:  Vital signs: BP 119/64   Pulse 92   Ht 6' (1.829 m)   Wt 248 lb (112.5 kg)   LMP 05/10/1992 Comment: partial  SpO2 93%   BMI 33.63 kg/m   Constitutional:   Pleasant overweight elderly Caucasian female appears to be in NAD, Well developed, Well nourished, alert and cooperative Respiratory: Respirations even and unlabored. Lungs clear  to auscultation bilaterally.   No wheezes, crackles, or rhonchi.  Cardiovascular: Normal S1, S2. No MRG. Regular rate and rhythm. No peripheral edema, cyanosis or pallor.  Gastrointestinal:  Soft, nondistended, nontender. No rebound or guarding. Normal bowel sounds. No appreciable masses or hepatomegaly. Rectal:  Not performed.  Psychiatric:  Demonstrates good judgement and reason without abnormal affect or behaviors.  RELEVANT LABS AND IMAGING: CBC    Component Value Date/Time   WBC 9.5 02/04/2020 1155   RBC 4.30 02/04/2020 1155   HGB 13.0 02/04/2020 1155   HGB 14.4 07/16/2019 1327   HCT 41.4 02/04/2020 1155   HCT 44.2 07/16/2019 1327   PLT 291 02/04/2020 1155   PLT 306 07/16/2019 1327   MCV 96.3 02/04/2020 1155   MCV 95 07/16/2019 1327   MCH 30.2 02/04/2020 1155   MCHC 31.4 02/04/2020 1155   RDW 13.6 02/04/2020 1155   RDW 13.8 07/16/2019 1327   LYMPHSABS 2,215 01/08/2020 1402   LYMPHSABS 2.3 07/16/2019 1327   MONOABS 0.7 10/04/2018 1402   EOSABS 350 01/08/2020 1402   EOSABS 0.5 (H) 07/16/2019 1327   BASOSABS 53 01/08/2020 1402   BASOSABS 0.1 07/16/2019 1327    CMP     Component Value  Date/Time   NA 136 02/04/2020 1155   K 4.7 02/04/2020 1155   CL 102 02/04/2020 1155   CO2 24 02/04/2020 1155   GLUCOSE 247 (H) 02/04/2020 1155   BUN 16 02/04/2020 1155   CREATININE 0.77 02/04/2020 1155   CREATININE 0.59 (L) 01/08/2020 1402   CALCIUM 9.0 02/04/2020 1155   PROT 6.6 02/04/2020 1155   ALBUMIN 3.4 (L) 02/04/2020 1155   AST 22 02/04/2020 1155   ALT 24 02/04/2020 1155   ALKPHOS 84 02/04/2020 1155   BILITOT 0.6 02/04/2020 1155   GFRNONAA >60 02/04/2020 1155   GFRNONAA 87 01/08/2020 1402   GFRAA >60 02/04/2020 1155   GFRAA 101 01/08/2020 1402    Assessment: 1.  IBS-D: Large work-up in the past for patient's diarrhea, thought related to IBS, was previously doing well on WelChol and Dicyclomine, this started not working over the past 6 months with episodes of fecal incontinence of soft stool, pelvic floor therapy has been discussed in the past which has been rejected by the patient  Plan: 1.  Discussed using Imodium half tab on a daily basis versus increasing her Dicyclomine.  Patient would like to try increasing her Dicyclomine first as the WelChol and Dicyclomine together seem to work for her for a while.  Increased Dicyclomine to 20 mg and instructed her to take this 4 times daily, 20 to 30 minutes before meals and at bedtime.  Sent in a prescription #120 with 3 refills. 2.  Told patient that she is going somewhere she really does not want to worry about diarrhea then she can definitely take an Imodium as needed.  She tells me she was not sure this was safe. 3.  Continue WelChol as prescribed 4.  Recommend the patient buy Pepcid over-the-counter and use this as needed for heartburn, if it becomes more consistent or chronic then we can discuss a PPI versus other medications. 5.  Patient follow in clinic in 2 months or sooner if necessary.  Ellouise Newer, PA-C [facility] 02/20/2020, 3:36 PM  Cc: Marin Olp, MD

## 2020-02-20 NOTE — Telephone Encounter (Signed)
Ok to order dip and culture or is OV needed?

## 2020-02-20 NOTE — Telephone Encounter (Signed)
Pt has been called and picking up kit

## 2020-02-21 ENCOUNTER — Ambulatory Visit: Payer: Self-pay | Admitting: Surgery

## 2020-02-21 ENCOUNTER — Encounter: Payer: Self-pay | Admitting: Physician Assistant

## 2020-02-21 ENCOUNTER — Other Ambulatory Visit: Payer: Medicare PPO

## 2020-02-21 DIAGNOSIS — R3915 Urgency of urination: Secondary | ICD-10-CM | POA: Diagnosis not present

## 2020-02-21 NOTE — Progress Notes (Signed)
Reviewed and agree with management plan.  Muaz Shorey T. Ereka Brau, MD FACG [facility]  

## 2020-02-21 NOTE — H&P (View-Only) (Signed)
[name] Appointment: 02/21/2020 1:30 PM Location: [facility] Patient #: [phone] DOB: 09/04/1940 Married / Language: English / Race: White Female  History of Present Illness Adin Hector MD; 02/21/2020 2:27 PM) The patient is a 79 year old female who presents with anal fistula. Note for "Anal fistula": ` ` ` The patient returns s/p LIFT anal fistula with hemorrhoidectomy. Excision anal polyp. Hemorrhoidal ligation and pexy. 11/22/2019      The patient returns to clinic after surgery, disappointed. She is worried that the drainage has gotten worse. Or purulence. She still gets some blood when she wipes. Some staining. No major incontinence though. She's been struggling with recurrent urinary tract infections. This is been a problem for a while. Has poor tolerance to many antibiotics. She is waiting for a call back on about a recent urine culture to see what antibiotic she is needs to be on. She on she has chronic urinary incontinence and wears a diaper. She's having loose bowel movements still struggling with her bowel and diarrhea. She is followed by gastroenterology. They have her on WelChol, dicyclomine, fiber. The discuss starting her on Imodium but she was worried about constipation and so she is try to hold off. However she is having urgency and incontinence and cannot go to church and is worried about her quality of life. Frustrated & disappointed    Pathology: SURGICAL PATHOLOGY CASE: [id number] PATIENT: Joanne Chars Surgical Pathology Report     Clinical History: Perirectal fistula (crm)     FINAL MICROSCOPIC DIAGNOSIS:  A. HEMORRHOID, LEFT ANTERIOR, HEMORRHOIDECTOMY: - Findings consistent with hemorrhoid. - No dysplasia or malignancy.  B. HEMORRHOID, RIGHT ANTERIOR, HEMORRHOIDECTOMY: - Findings consistent with hemorrhoid with focal inflammation. - No dysplasia or malignancy.  C. FISTULA TRACK: - Fistula tract  associated with acute inflammation and granulation tissue. - No dysplasia or malignancy.  D. HEMORRHOID, RIGHT LATERAL, HEMORRHOIDECTOMY: - Findings consistent with hemorrhoid. - No dysplasia or malignancy.    [facility] DESCRIPTION:  A: Received in formalin are 2 portions of tan-red mucosa and subjacent tissue measuring 1.2 and 1.5 cm in greatest dimension. The cut surfaces are edematous and hyperemic. Sections are submitted in 1 cassette.  B: Received in formalin are 2 portions of tan-red skin, mucosa and subjacent tissue measuring 1.2 and 1.4 cm in greatest dimension. The cut surfaces are edematous and hyperemic. Sections are submitted in 1 cassette.  C: Received in formalin is a 3.2 x 1.5 cm portion of skin and subcutaneous tissue excised to a depth of up to 4.2 cm. The skin surface shows a 0.7 cm linear defect marked with blue dye. There is a fistula tract extending from the defect to the deep surface. There is hemorrhage and fibrosis surrounding the fistula tract. A section is submitted.  D: Received in formalin is a 1.0 x 0.5 x 0.4 cm polypoid portion of tan-pink mucosa. The specimen is bisected and entirely submitted in 1 cassette. [facility] 11/22/2019)    Final Diagnosis performed by Claudette Laws, MD. Electronically signed 11/23/2019 Technical and / or Professional components performed at [facility], [place] [address], [place], [place] [address]. Immunohistochemistry Technical component (if applicable) was performed at [facility] [address] [place], [place], [place] [address]. IMMUNOHISTOCHEMISTRY DISCLAIMER (if applicable): Some of these immunohistochemical stains may have been developed and the performance characteristics determine by [facility]. Some may not have been cleared or approved by the U.S. Food and Drug Administration. The FDA has determined that  such clearance or approval is not necessary.  This test is used for clinical purposes. It should not be regarded as investigational or for research. This laboratory is certified under the [place] (CLIA-88) as qualified to perform high complexity clinical laboratory testing. The controls stained appropriately.     11/22/2019  1:36 PM  PATIENT: [name] 79 y.o. female  Patient Care Team: Marin Olp, MD as PCP - General (Family Medicine) Michael Boston, MD as Consulting Physician (General Surgery) Mauri Pole, MD as Consulting Physician (Gastroenterology) Jerline Pain, MD as Consulting Physician (Cardiology) Gaynelle Arabian, MD as Consulting Physician (Orthopedic Surgery)  PRE-OPERATIVE DIAGNOSIS:  PERIRECTAL FISTULA EXTERNAL HEMORRHOIDS WITH IRRITATION  POST-OPERATIVE DIAGNOSIS: INTERSPHINCTERIC PERIRECTAL FISTULA EXTERNAL HEMORRHOIDS WITH IRRITATION PROLAPSING ANAL POLYP  PROCEDURE: LIFT REPAIR OF PERIRECTAL FISTULA HEMORRHOIDECTOMY EXCISION OF ANAL POLYP HEMORRHOID LIGATION & PEXY ANORECTAL EXAMINATION UNDER ANESTHESIA  SURGEON: Adin Hector, MD  ASSISTANT: OR Staff  ANESTHESIA:  General Anorectal & Local field block (0.25% bupivacaine with epinephrine mixed with Liposomal bupivacaine (Experel)   EBL: Total I/O In: 3710 [I.V.:1000; IV Piggyback:111] Out: - . See anesthesia record  Delay start of Pharmacological VTE agent (>24hrs) due to surgical blood loss or risk of bleeding: no  DRAINS: none  SPECIMENS:  External component of intersphincteric fistulous tract. External hemorrhoids. Prolapsing anal canal polyp  DISPOSITION OF SPECIMEN: PATHOLOGY  COUNTS: YES  PLAN OF CARE: Discharge to home after PACU  PATIENT DISPOSITION: PACU - hemodynamically stable.  INDICATION: Patient with probable perirectal fistula. I recommended examination and surgical treatment:  The anatomy & physiology of the anorectal region was  discussed. We discussed the pathophysiology of anorectal abscess and fistula. Differential diagnosis was discussed. Natural history progression was discussed. I stressed the importance of a bowel regimen to have daily soft bowel movements to minimize progression of disease.   The patient's condition is not adequately controlled. Non-operative treatment has not healed the fistula. Therefore, I recommended examination under anaesthesia to confirm the diagnosis and treat the fistula. I discussed techniques that may be required such as fistulotomy, ligation by LIFT technique, and/or seton placement. Benefits & alternatives discussed. I noted a good likelihood this will help address the problem, but sometimes repeat operations and prolonged healing times may occur. Risks such as bleeding, pain, recurrence, reoperation, incontinence, heart attack, death, and other risks were discussed.   Educational handouts further explaining the pathology, treatment options, and bowel regimen were given. The patient expressed understanding & wishes to proceed. We will work to coordinate surgery for a mutually convenient time.  OR FINDINGS: Patient had an intersphincteric fistula.   External location LEFT ANTERIOR  about 4 cm from anal verge.  Internal location : Anterior midline at anal crypt about 1 cm from anal verge.  Right anterior external hemorrhoidal tissue. Prolapsing right lateral anal canal polyp. Grade 2 internal hemorrhoids x3 with some irritation especially right anterior & right posterior greater than left lateral  DESCRIPTION:  Informed consent was confirmed. Patient underwent general anesthesia without difficulty. Patient was placed into prone positioning. Care was made for safe positioning given her prior shoulder and knee surgeries the perianal region was prepped and draped in sterile fashion. Surgical timeout confirmed or plan.  I did digital rectal examination and then  transitioned over to anoscopy to get a sense of the anatomy. I did place a probe through the external opening. I also injected the track with methylene blue. With this I was able to  locate an internal opening. The tract did not feel superficial, concerning for a probable intersphincteric fistula. No abscess located.  I went ahead and proceeded with the LIFT technique. I began to excise the external opening with a radial biconcave incision around it. I transitioned to cautery and help free the fistulous tract circumferentially all way down towards the sphincter component.   I made an incision at the anal squamocolumnar junction . Did careful dissection to get down to the sphincter complex. I carefully went between the internal and external sphincter using careful blunt dissection parallel to the fibers. I was able to locate the intersphincteric component of the fistulous tract. I was able to get around it gently with a right angle clamp. I carefully skeletonized the intersphincteric component. I placed 2-0 Vicryl stitches through the intersphincteric tract on the proximal side and on the distal side in between the external & internal sphincters. I transected the intersphincteric segment of the fistulous tract. I ligated the stumps of the transected segments with 2-0 Vicryl again with a figure-of-eight stitch in a 90 degree fashion to doubly ligate and prove that the tract had been closed.  I then transitioned to the rectal component. Did a figure-of-eight stitch of 2-0 Vicryl suture several centimeters proximal to the internal opening in the rectum along the left anterior hemorrhoidal column. I ran that longitudinally until it came to the opening. I transected the rectal tissue anorectal component and a longitudinal fusiform fashion until I had healthier tissue. I then ran the 2-0 Vicryl stitch down to the anal verge to also help cover up the intersphincteric ligation wound as well. I  tied that running suture down, thus closing the internal opening and protecting the LIFT repair.   I removed the superficial external end of the fistulous tract & ligated the base of the external wound just outside the sphincter component with 2-0 vicryl. Excised some skin and dermis to have a broad flat wound. I also did internal hemorrhoidal ligation on the other 5 hemorrhoidal columns using 2-0 Vicryl in a running suture 6 cm proximal anal verge in the running down longitudinal and tying down for pexy. I had excised a right lateral prolapsing anal canal polyp as well. Trimmed off some persistent right lateral and right anterior external hemorrhoidal tissue. Close the right lateral wound with chromic sutures horizontal mattress interrupted. Right anterior was more transverse I closed that with some interrupted horizontal mattress sutures transversely at the anal verge for good result. Hemostasis was excellent.  I reexamined the anal canal. There is was no narrowing. Hemostasis was excellent. I repeated anoscopy and examination. Hemostasis was good. We placed fluff gauze to onlay over the wounds. No packing done. Patient is being extubated go to recovery room. I discussed operative findings, updated the patient's status, discussed probable steps to recovery, and gave postoperative recommendations to the patient's spouse. Recommendations were made. Questions were answered. He expressed understanding & appreciation.     Adin Hector, M.D., F.A.C.S. Gastrointestinal and Minimally Invasive Surgery [facility] [address], [place], [place] [address] [phone] Main / Paging ` ` `   Problem List/Past Medical Adin Hector, MD; 02/21/2020 1:43 PM) ACUTE DIVERTICULITIS (K57.92) PERIANAL ABSCESS (K61.0) INTERSPHINCTERIC FISTULA (K60.3) ANAL POLYP (K62.0) EXTERNAL HEMORRHOID (K64.4) HISTORY OF RECTAL SURGERY (O16.073)  Past  Surgical History Adin Hector, MD; 02/21/2020 1:43 PM) Breast Biopsy Bilateral. Cataract Surgery Right. Gallbladder Surgery - Laparoscopic Hysterectomy (not due to cancer) - Partial Knee  Surgery Right. Mastectomy Bilateral. Sentinel Lymph Node Biopsy Shoulder Surgery Left. Thyroid Surgery Tonsillectomy  Diagnostic Studies History Adin Hector, MD; 02/21/2020 1:43 PM) Pap Smear 1-5 years ago  Allergies Darden Palmer, RMA; 02/21/2020 1:38 PM) Cephalexin *CEPHALOSPORINS* Erythromycin *MACROLIDES* LevoFLOXacin *CHEMICALS* Nitrofurantoin *URINARY ANTI-INFECTIVES* OxyCODONE HCl (Abuse Deter) *ANALGESICS - OPIOID* Allergies Reconciled  Medication History Darden Palmer, RMA; 02/21/2020 1:38 PM) Fluconazole (150MG  Tablet, 1 (one) Oral daily, Taken starting 09/11/2019) Active. Amoxicillin-Pot Clavulanate (875-125MG  Tablet, 1 (one) Oral two times daily, Taken starting 09/11/2019) Active. Symbicort (160-4.5MCG/ACT Aerosol, Inhalation) Active. Atenolol (25MG  Tablet, Oral) Active. Durezol (0.05% Emulsion, Ophthalmic) Active. Glimepiride (4MG  Tablet, Oral) Active. FLUoxetine HCl (40MG  Capsule, Oral) Active. Meloxicam (15MG  Tablet, Oral) Active. ProAir HFA (108 (90 Base)MCG/ACT Aerosol Soln, Inhalation) Active. Victoza (18MG /3ML Soln Pen-inj, Subcutaneous) Active. Lisinopril (10MG  Tablet, Oral) Active. Pramipexole Dihydrochloride (0.5MG  Tablet, Oral) Active. Terconazole (0.4% Cream, Vaginal) Active. Medications Reconciled  Social History Adin Hector, MD; 02/21/2020 1:43 PM) Caffeine use Carbonated beverages. No alcohol use No drug use Tobacco use Never smoker.  Family History Adin Hector, MD; 02/21/2020 1:43 PM) Arthritis Mother. Heart Disease Father. Respiratory Condition Father.  Pregnancy / Birth History Adin Hector, MD; 02/21/2020 1:43 PM) [age] of menopause 50-55 Regular periods  Other Problems Adin Hector,  MD; 02/21/2020 1:43 PM) Arthritis Breast Cancer Diabetes Mellitus Diverticulosis Hemorrhoids High blood pressure Oophorectomy Bilateral. Thyroid Disease    Vitals Darden Palmer RMA; 02/21/2020 1:39 PM) 02/21/2020 1:38 PM Height: 70in Weight Measurement Declined Height was reported by patient. Temp.: 97.81F  Pulse: 88 (Regular)  P.OX: 91% (Room air) BP: 120/78(Sitting, Left Arm, Standard)        Physical Exam Adin Hector MD; 02/21/2020 2:16 PM)  General Mental Status-Alert. General Appearance-Not in acute distress. Voice-Normal. Note: Comes in a wheelchair. Moves very slowly but steadily.  Integumentary Global Assessment Upon inspection and palpation of skin surfaces of the - Distribution of scalp and body hair is normal. General Characteristics Overall examination of the patient's skin reveals - no rashes and no suspicious lesions.  Head and Neck Head-normocephalic, atraumatic with no lesions or palpable masses. Face Global Assessment - atraumatic, no absence of expression. Neck Global Assessment - no abnormal movements, no decreased range of motion. Trachea-midline. Thyroid Gland Characteristics - non-tender.  Eye Eyeball - Left-Extraocular movements intact, No Nystagmus - Left. Eyeball - Right-Extraocular movements intact, No Nystagmus - Right. Upper Eyelid - Left-No Cyanotic - Left. Upper Eyelid - Right-No Cyanotic - Right.  Chest and Lung Exam Inspection Accessory muscles - No use of accessory muscles in breathing.  Rectal Note: Perianal region with moisture. Left anterior perirectal wound 15 mm wide about 3 cm deep. More narrow but with gray purulent drainage - worse. No undrained abscess.. Very suspicious for persistent or recurrent fistula.  Otherwise normal sphincter tone. No other perianal wounds.   Peripheral Vascular Upper Extremity Inspection - Left - Not Gangrenous, No Petechiae. Inspection  - Right - Not Gangrenous, No Petechiae.  Neurologic Neurologic evaluation reveals -normal attention span and ability to concentrate, able to name objects and repeat phrases. Appropriate fund of knowledge and normal coordination.  Neuropsychiatric Mental status exam performed with findings of-able to articulate well with normal speech/language, rate, volume and coherence and no evidence of hallucinations, delusions, obsessions or homicidal/suicidal ideation. Orientation-oriented X3.  Musculoskeletal Global Assessment Gait and Station - normal gait and station.  Lymphatic General Lymphatics Description - No Generalized lymphadenopathy.    Assessment & Plan Adin Hector MD; 02/21/2020 2:26  PM)  INTERSPHINCTERIC FISTULA (K60.3) Impression: Slowly recovering status post LIFT repair for complex intersphincteric anal fistula.  Her wound has closed down some but it seems to have more purulent drainage now. I'm concerned about a persistent or recurrent fistula. I suspect she would benefit from examination under anesthesia with possible seton placement versus fistulotomy to get this under better control. Hopefully can discharge to a superficial fistulotomy but will be hard to tell until I can get her asleep in the operating room. She is disappointed on the other preparation, she agrees things are worse. It's been 3 months. She agrees to proceed.  Current Plans Pt Education - [facility] Abscess/Fistula (AT): discussed with patient and provided information.  IRRITABLE BOWEL SYNDROME WITH DIARRHEA (K58.0) Impression: Chronic diarrhea followed closely by [facility] gastroneurology. Still struggling despite WelChol, dicyclomine, fiber. I recommend she had Imodium. One pill twice a day. Gradually increase up to 2 pills 4 times a day. She was worried about getting constipated but she's had diarrhea most of her life. I am skeptical that Imodium a day will overdo things. Even if that happens, she can hold  the dose until she moves her bowels again.  Noted her diarrhea is deftly stressing out any potential direct issues such as her hemorrhoids and fistula. It'll be hard for her fistula to heal correctly if her diarrhea does not get under better control.  Current Plans Pt Education - [facility] IBS patient info: discussed with patient and provided information. Pt Education - [facility] Good Bowel Health (Jaycelyn Orrison)  RECURRENT UTI (URINARY TRACT INFECTION) (N39.0) Impression: Recurrent urinary tract infections with multiple species. I think she is overdue to see urology given her urinary incontinence. See if there is an etiology for her chronic UTIs. 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  No evidence of any colovesical fistula with her prior history of diverticulitis. No attacks of diverticulitis past 4 years. No bubble or inflammation of the bladder noted on CAT scan last month.  She does not think she has seen a urologist. I believe her husband has been under the care Dr. Alinda Money so she requests him if that is an option.  Current Plans Instructed the patient to make an appointment for a follow-up office visit after seeing the recommended specialist.  ANAL POLYP (K62.0) Impression: Status post excision of hemorrhoid and anal polyp. Pathology benign. Reassuring.   EXTERNAL HEMORRHOID (K64.4)   ENCOUNTER FOR PREOPERATIVE EXAMINATION FOR GENERAL SURGICAL PROCEDURE (V78.588)  Current Plans The anatomy & physiology of the anorectal region was discussed. We discussed the pathophysiology of anorectal abscess and fistula. Differential diagnosis was discussed. Natural history progression was discussed. I stressed the importance of a bowel regimen to have daily soft bowel movements to minimize progression of disease.  The patient's condition is not adequately controlled. Non-operative treatment has not healed the fistula. Therefore, I recommended  examination under anaesthesia to confirm the diagnosis and treat the fistula. I discussed techniques that may be required such as fistulotomy, ligation by LIFT technique, and/or seton placement. Benefits & alternatives discussed. I noted a good likelihood this will help address the problem, but sometimes repeat operations and prolonged healing times may occur. Risks such as bleeding, pain, recurrence, reoperation, incontinence, heart attack, death, and other risks were discussed.  Educational handouts further explaining the pathology, treatment options, and bowel regimen were given. The patient expressed understanding & wishes to proceed. We will work to coordinate surgery for a mutually convenient time.  You  are being scheduled for surgery- Our schedulers will call you.  You should hear from our office's scheduling department within 5 working days about the location, date, and time of surgery. We try to make accommodations for patient's preferences in scheduling surgery, but sometimes the OR schedule or the surgeon's schedule prevents [name] from making those accommodations.  If you have not heard from our office [phone]) in 5 working days, call the office and ask for your surgeon's nurse.  If you have other questions about your diagnosis, plan, or surgery, call the office and ask for your surgeon's nurse.  Pt Education - [facility] Rectal Prep for Anorectal outpatient/office surgery: discussed with patient and provided information. Pt Education - [facility] Rectal Surgery HCI (Emmalise Huard): discussed with patient and provided information.  Adin Hector, MD, FACS, MASCRS Gastrointestinal and Minimally Invasive Surgery  [facility] [address] [place], [place] [address] [phone] Fax [phone] Main/Paging  CONTACT INFORMATION: Weekday (9AM-5PM) concerns: Call [facility] main office at [phone] Weeknight (5PM-9AM) or Weekend/Holiday concerns: Check  [email or web address] for General Surgery [facility] coverage (Please, do not use SecureChat as it is not reliable communication to operating surgeons for immediate patient care)

## 2020-02-21 NOTE — H&P (Signed)
[name] Appointment: 02/21/2020 1:30 PM Location: [facility] Patient #: [id number] DOB: Jun 10, 1940 Married / Language: English / Race: White Female  History of Present Illness Adin Hector MD; 02/21/2020 2:27 PM) The patient is a 79 year old female who presents with anal fistula. Note for "Anal fistula": ` ` ` The patient returns s/p LIFT anal fistula with hemorrhoidectomy. Excision anal polyp. Hemorrhoidal ligation and pexy. 11/22/2019      The patient returns to clinic after surgery, disappointed. She is worried that the drainage has gotten worse. Or purulence. She still gets some blood when she wipes. Some staining. No major incontinence though. She's been struggling with recurrent urinary tract infections. This is been a problem for a while. Has poor tolerance to many antibiotics. She is waiting for a call back on about a recent urine culture to see what antibiotic she is needs to be on. She on she has chronic urinary incontinence and wears a diaper. She's having loose bowel movements still struggling with her bowel and diarrhea. She is followed by gastroenterology. They have her on WelChol, dicyclomine, fiber. The discuss starting her on Imodium but she was worried about constipation and so she is try to hold off. However she is having urgency and incontinence and cannot go to church and is worried about her quality of life. Frustrated & disappointed    Pathology: SURGICAL PATHOLOGY CASE: [id number] PATIENT: [name] Surgical Pathology Report     Clinical History: Perirectal fistula (crm)     FINAL MICROSCOPIC DIAGNOSIS:  A. HEMORRHOID, LEFT ANTERIOR, HEMORRHOIDECTOMY: - Findings consistent with hemorrhoid. - No dysplasia or malignancy.  B. HEMORRHOID, RIGHT ANTERIOR, HEMORRHOIDECTOMY: - Findings consistent with hemorrhoid with focal inflammation. - No dysplasia or malignancy.  C. FISTULA TRACK: - Fistula tract  associated with acute inflammation and granulation tissue. - No dysplasia or malignancy.  D. HEMORRHOID, RIGHT LATERAL, HEMORRHOIDECTOMY: - Findings consistent with hemorrhoid. - No dysplasia or malignancy.    [name] DESCRIPTION:  A: Received in formalin are 2 portions of tan-red mucosa and subjacent tissue measuring 1.2 and 1.5 cm in greatest dimension. The cut surfaces are edematous and hyperemic. Sections are submitted in 1 cassette.  B: Received in formalin are 2 portions of tan-red skin, mucosa and subjacent tissue measuring 1.2 and 1.4 cm in greatest dimension. The cut surfaces are edematous and hyperemic. Sections are submitted in 1 cassette.  C: Received in formalin is a 3.2 x 1.5 cm portion of skin and subcutaneous tissue excised to a depth of up to 4.2 cm. The skin surface shows a 0.7 cm linear defect marked with blue dye. There is a fistula tract extending from the defect to the deep surface. There is hemorrhage and fibrosis surrounding the fistula tract. A section is submitted.  D: Received in formalin is a 1.0 x 0.5 x 0.4 cm polypoid portion of tan-pink mucosa. The specimen is bisected and entirely submitted in 1 cassette. [facility] 11/22/2019)    Final Diagnosis performed by Claudette Laws, MD. Electronically signed 11/23/2019 Technical and / or Professional components performed at [facility], [place] [address] [place], [place] [address]. Immunohistochemistry Technical component (if applicable) was performed at [facility]. [address] [place], [place], [place] [address]. IMMUNOHISTOCHEMISTRY DISCLAIMER (if applicable): Some of these immunohistochemical stains may have been developed and the performance characteristics determine by [facility]. Some may not have been cleared or approved by the U.S. Food and Drug Administration. The FDA has determined that  such clearance or approval is not necessary.  This test is used for clinical purposes. It should not be regarded as investigational or for research. This laboratory is certified under the [employer] (CLIA-88) as qualified to perform high complexity clinical laboratory testing. The controls stained appropriately.     11/22/2019  1:36 PM  PATIENT: [name] 79 y.o. female  Patient Care Team: Marin Olp, MD as PCP - General (Family Medicine) Michael Boston, MD as Consulting Physician (General Surgery) Mauri Pole, MD as Consulting Physician (Gastroenterology) Jerline Pain, MD as Consulting Physician (Cardiology) Gaynelle Arabian, MD as Consulting Physician (Orthopedic Surgery)  PRE-OPERATIVE DIAGNOSIS:  PERIRECTAL FISTULA EXTERNAL HEMORRHOIDS WITH IRRITATION  POST-OPERATIVE DIAGNOSIS: INTERSPHINCTERIC PERIRECTAL FISTULA EXTERNAL HEMORRHOIDS WITH IRRITATION PROLAPSING ANAL POLYP  PROCEDURE: LIFT REPAIR OF PERIRECTAL FISTULA HEMORRHOIDECTOMY EXCISION OF ANAL POLYP HEMORRHOID LIGATION & PEXY ANORECTAL EXAMINATION UNDER ANESTHESIA  SURGEON: Adin Hector, MD  ASSISTANT: OR Staff  ANESTHESIA:  General Anorectal & Local field block (0.25% bupivacaine with epinephrine mixed with Liposomal bupivacaine (Experel)   EBL: Total I/O In: 2297 [I.V.:1000; IV Piggyback:111] Out: - . See anesthesia record  Delay start of Pharmacological VTE agent (>24hrs) due to surgical blood loss or risk of bleeding: no  DRAINS: none  SPECIMENS:  External component of intersphincteric fistulous tract. External hemorrhoids. Prolapsing anal canal polyp  DISPOSITION OF SPECIMEN: PATHOLOGY  COUNTS: YES  PLAN OF CARE: Discharge to home after PACU  PATIENT DISPOSITION: PACU - hemodynamically stable.  INDICATION: Patient with probable perirectal fistula. I recommended examination and surgical treatment:  The anatomy & physiology of the anorectal region was  discussed. We discussed the pathophysiology of anorectal abscess and fistula. Differential diagnosis was discussed. Natural history progression was discussed. I stressed the importance of a bowel regimen to have daily soft bowel movements to minimize progression of disease.   The patient's condition is not adequately controlled. Non-operative treatment has not healed the fistula. Therefore, I recommended examination under anaesthesia to confirm the diagnosis and treat the fistula. I discussed techniques that may be required such as fistulotomy, ligation by LIFT technique, and/or seton placement. Benefits & alternatives discussed. I noted a good likelihood this will help address the problem, but sometimes repeat operations and prolonged healing times may occur. Risks such as bleeding, pain, recurrence, reoperation, incontinence, heart attack, death, and other risks were discussed.   Educational handouts further explaining the pathology, treatment options, and bowel regimen were given. The patient expressed understanding & wishes to proceed. We will work to coordinate surgery for a mutually convenient time.  OR FINDINGS: Patient had an intersphincteric fistula.   External location LEFT ANTERIOR  about 4 cm from anal verge.  Internal location : Anterior midline at anal crypt about 1 cm from anal verge.  Right anterior external hemorrhoidal tissue. Prolapsing right lateral anal canal polyp. Grade 2 internal hemorrhoids x3 with some irritation especially right anterior & right posterior greater than left lateral  DESCRIPTION:  Informed consent was confirmed. Patient underwent general anesthesia without difficulty. Patient was placed into prone positioning. Care was made for safe positioning given her prior shoulder and knee surgeries the perianal region was prepped and draped in sterile fashion. Surgical timeout confirmed or plan.  I did digital rectal examination and then  transitioned over to anoscopy to get a sense of the anatomy. I did place a probe through the external opening. I also injected the track with methylene blue. With this I was able to  locate an internal opening. The tract did not feel superficial, concerning for a probable intersphincteric fistula. No abscess located.  I went ahead and proceeded with the LIFT technique. I began to excise the external opening with a radial biconcave incision around it. I transitioned to cautery and help free the fistulous tract circumferentially all way down towards the sphincter component.   I made an incision at the anal squamocolumnar junction . Did careful dissection to get down to the sphincter complex. I carefully went between the internal and external sphincter using careful blunt dissection parallel to the fibers. I was able to locate the intersphincteric component of the fistulous tract. I was able to get around it gently with a right angle clamp. I carefully skeletonized the intersphincteric component. I placed 2-0 Vicryl stitches through the intersphincteric tract on the proximal side and on the distal side in between the external & internal sphincters. I transected the intersphincteric segment of the fistulous tract. I ligated the stumps of the transected segments with 2-0 Vicryl again with a figure-of-eight stitch in a 90 degree fashion to doubly ligate and prove that the tract had been closed.  I then transitioned to the rectal component. Did a figure-of-eight stitch of 2-0 Vicryl suture several centimeters proximal to the internal opening in the rectum along the left anterior hemorrhoidal column. I ran that longitudinally until it came to the opening. I transected the rectal tissue anorectal component and a longitudinal fusiform fashion until I had healthier tissue. I then ran the 2-0 Vicryl stitch down to the anal verge to also help cover up the intersphincteric ligation wound as well. I  tied that running suture down, thus closing the internal opening and protecting the LIFT repair.   I removed the superficial external end of the fistulous tract & ligated the base of the external wound just outside the sphincter component with 2-0 vicryl. Excised some skin and dermis to have a broad flat wound. I also did internal hemorrhoidal ligation on the other 5 hemorrhoidal columns using 2-0 Vicryl in a running suture 6 cm proximal anal verge in the running down longitudinal and tying down for pexy. I had excised a right lateral prolapsing anal canal polyp as well. Trimmed off some persistent right lateral and right anterior external hemorrhoidal tissue. Close the right lateral wound with chromic sutures horizontal mattress interrupted. Right anterior was more transverse I closed that with some interrupted horizontal mattress sutures transversely at the anal verge for good result. Hemostasis was excellent.  I reexamined the anal canal. There is was no narrowing. Hemostasis was excellent. I repeated anoscopy and examination. Hemostasis was good. We placed fluff gauze to onlay over the wounds. No packing done. Patient is being extubated go to recovery room. I discussed operative findings, updated the patient's status, discussed probable steps to recovery, and gave postoperative recommendations to the patient's spouse. Recommendations were made. Questions were answered. He expressed understanding & appreciation.     Adin Hector, M.D., F.A.C.S. Gastrointestinal and Minimally Invasive Surgery [facility] [address] [place], [place] [address] [phone] Main / Paging ` ` `   Problem List/Past Medical Adin Hector, MD; 02/21/2020 1:43 PM) ACUTE DIVERTICULITIS (K57.92) PERIANAL ABSCESS (K61.0) INTERSPHINCTERIC FISTULA (K60.3) ANAL POLYP (K62.0) EXTERNAL HEMORRHOID (K64.4) HISTORY OF RECTAL SURGERY (W10.272)  Past  Surgical History Adin Hector, MD; 02/21/2020 1:43 PM) Breast Biopsy Bilateral. Cataract Surgery Right. Gallbladder Surgery - Laparoscopic Hysterectomy (not due to cancer) - Partial Knee  Surgery Right. Mastectomy Bilateral. Sentinel Lymph Node Biopsy Shoulder Surgery Left. Thyroid Surgery Tonsillectomy  Diagnostic Studies History Adin Hector, MD; 02/21/2020 1:43 PM) Pap Smear 1-5 years ago  Allergies Darden Palmer, RMA; 02/21/2020 1:38 PM) Cephalexin *CEPHALOSPORINS* Erythromycin *MACROLIDES* LevoFLOXacin *CHEMICALS* Nitrofurantoin *URINARY ANTI-INFECTIVES* OxyCODONE HCl (Abuse Deter) *ANALGESICS - OPIOID* Allergies Reconciled  Medication History Darden Palmer, RMA; 02/21/2020 1:38 PM) Fluconazole (150MG  Tablet, 1 (one) Oral daily, Taken starting 09/11/2019) Active. Amoxicillin-Pot Clavulanate (875-125MG  Tablet, 1 (one) Oral two times daily, Taken starting 09/11/2019) Active. Symbicort (160-4.5MCG/ACT Aerosol, Inhalation) Active. Atenolol (25MG  Tablet, Oral) Active. Durezol (0.05% Emulsion, Ophthalmic) Active. Glimepiride (4MG  Tablet, Oral) Active. FLUoxetine HCl (40MG  Capsule, Oral) Active. Meloxicam (15MG  Tablet, Oral) Active. ProAir HFA (108 (90 Base)MCG/ACT Aerosol Soln, Inhalation) Active. Victoza (18MG /3ML Soln Pen-inj, Subcutaneous) Active. Lisinopril (10MG  Tablet, Oral) Active. Pramipexole Dihydrochloride (0.5MG  Tablet, Oral) Active. Terconazole (0.4% Cream, Vaginal) Active. Medications Reconciled  Social History Adin Hector, MD; 02/21/2020 1:43 PM) Caffeine use Carbonated beverages. No alcohol use No drug use Tobacco use Never smoker.  Family History Adin Hector, MD; 02/21/2020 1:43 PM) Arthritis Mother. Heart Disease Father. Respiratory Condition Father.  Pregnancy / Birth History Adin Hector, MD; 02/21/2020 1:43 PM) [age] of menopause 50-55 Regular periods  Other Problems Adin Hector,  MD; 02/21/2020 1:43 PM) Arthritis Breast Cancer Diabetes Mellitus Diverticulosis Hemorrhoids High blood pressure Oophorectomy Bilateral. Thyroid Disease    Vitals Darden Palmer RMA; 02/21/2020 1:39 PM) 02/21/2020 1:38 PM Height: 70in Weight Measurement Declined Height was reported by patient. Temp.: 97.51F  Pulse: 88 (Regular)  P.OX: 91% (Room air) BP: 120/78(Sitting, Left Arm, Standard)        Physical Exam Adin Hector MD; 02/21/2020 2:16 PM)  General Mental Status-Alert. General Appearance-Not in acute distress. Voice-Normal. Note: Comes in a wheelchair. Moves very slowly but steadily.  Integumentary Global Assessment Upon inspection and palpation of skin surfaces of the - Distribution of scalp and body hair is normal. General Characteristics Overall examination of the patient's skin reveals - no rashes and no suspicious lesions.  Head and Neck Head-normocephalic, atraumatic with no lesions or palpable masses. Face Global Assessment - atraumatic, no absence of expression. Neck Global Assessment - no abnormal movements, no decreased range of motion. Trachea-midline. Thyroid Gland Characteristics - non-tender.  Eye Eyeball - Left-Extraocular movements intact, No Nystagmus - Left. Eyeball - Right-Extraocular movements intact, No Nystagmus - Right. Upper Eyelid - Left-No Cyanotic - Left. Upper Eyelid - Right-No Cyanotic - Right.  Chest and Lung Exam Inspection Accessory muscles - No use of accessory muscles in breathing.  Rectal Note: Perianal region with moisture. Left anterior perirectal wound 15 mm wide about 3 cm deep. More narrow but with gray purulent drainage - worse. No undrained abscess.. Very suspicious for persistent or recurrent fistula.  Otherwise normal sphincter tone. No other perianal wounds.   Peripheral Vascular Upper Extremity Inspection - Left - Not Gangrenous, No Petechiae. Inspection  - Right - Not Gangrenous, No Petechiae.  Neurologic Neurologic evaluation reveals -normal attention span and ability to concentrate, able to name objects and repeat phrases. Appropriate fund of knowledge and normal coordination.  Neuropsychiatric Mental status exam performed with findings of-able to articulate well with normal speech/language, rate, volume and coherence and no evidence of hallucinations, delusions, obsessions or homicidal/suicidal ideation. Orientation-oriented X3.  Musculoskeletal Global Assessment Gait and Station - normal gait and station.  Lymphatic General Lymphatics Description - No Generalized lymphadenopathy.    Assessment & Plan Adin Hector MD; 02/21/2020 2:26  PM)  INTERSPHINCTERIC FISTULA (K60.3) Impression: Slowly recovering status post LIFT repair for complex intersphincteric anal fistula.  Her wound has closed down some but it seems to have more purulent drainage now. I'm concerned about a persistent or recurrent fistula. I suspect she would benefit from examination under anesthesia with possible seton placement versus fistulotomy to get this under better control. Hopefully can discharge to a superficial fistulotomy but will be hard to tell until I can get her asleep in the operating room. She is disappointed on the other preparation, she agrees things are worse. It's been 3 months. She agrees to proceed.  Current Plans Pt Education - [facility] Abscess/Fistula (AT): discussed with patient and provided information.  IRRITABLE BOWEL SYNDROME WITH DIARRHEA (K58.0) Impression: Chronic diarrhea followed closely by [facility]. Still struggling despite WelChol, dicyclomine, fiber. I recommend she had Imodium. One pill twice a day. Gradually increase up to 2 pills 4 times a day. She was worried about getting constipated but she's had diarrhea most of her life. I am skeptical that Imodium a day will overdo things. Even if that happens, she can hold  the dose until she moves her bowels again.  Noted her diarrhea is deftly stressing out any potential direct issues such as her hemorrhoids and fistula. It'll be hard for her fistula to heal correctly if her diarrhea does not get under better control.  Current Plans Pt Education - [facility] IBS patient info: discussed with patient and provided information. Pt Education - [facility] Good Bowel Health (Ashaad Gaertner)  RECURRENT UTI (URINARY TRACT INFECTION) (N39.0) Impression: Recurrent urinary tract infections with multiple species. I think she is overdue to see urology given her urinary incontinence. See if there is an etiology for her chronic UTIs. 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  No evidence of any colovesical fistula with her prior history of diverticulitis. No attacks of diverticulitis past 4 years. No bubble or inflammation of the bladder noted on CAT scan last month.  She does not think she has seen a urologist. I believe her husband has been under the care Dr. Alinda Money so she requests him if that is an option.  Current Plans Instructed the patient to make an appointment for a follow-up office visit after seeing the recommended specialist.  ANAL POLYP (K62.0) Impression: Status post excision of hemorrhoid and anal polyp. Pathology benign. Reassuring.   EXTERNAL HEMORRHOID (K64.4)   ENCOUNTER FOR PREOPERATIVE EXAMINATION FOR GENERAL SURGICAL PROCEDURE (B76.283)  Current Plans The anatomy & physiology of the anorectal region was discussed. We discussed the pathophysiology of anorectal abscess and fistula. Differential diagnosis was discussed. Natural history progression was discussed. I stressed the importance of a bowel regimen to have daily soft bowel movements to minimize progression of disease.  The patient's condition is not adequately controlled. Non-operative treatment has not healed the fistula. Therefore, I recommended  examination under anaesthesia to confirm the diagnosis and treat the fistula. I discussed techniques that may be required such as fistulotomy, ligation by LIFT technique, and/or seton placement. Benefits & alternatives discussed. I noted a good likelihood this will help address the problem, but sometimes repeat operations and prolonged healing times may occur. Risks such as bleeding, pain, recurrence, reoperation, incontinence, heart attack, death, and other risks were discussed.  Educational handouts further explaining the pathology, treatment options, and bowel regimen were given. The patient expressed understanding & wishes to proceed. We will work to coordinate surgery for a mutually convenient time.  You  are being scheduled for surgery- Our schedulers will call you.  You should hear from our office's scheduling department within 5 working days about the location, date, and time of surgery. We try to make accommodations for patient's preferences in scheduling surgery, but sometimes the OR schedule or the surgeon's schedule prevents [name] from making those accommodations.  If you have not heard from our office [phone]) in 5 working days, call the office and ask for your surgeon's nurse.  If you have other questions about your diagnosis, plan, or surgery, call the office and ask for your surgeon's nurse.  Pt Education - [facility] Rectal Prep for Anorectal outpatient/office surgery: discussed with patient and provided information. Pt Education - [facility] Rectal Surgery HCI (Allanah Mcfarland): discussed with patient and provided information.  Adin Hector, MD, FACS, MASCRS Gastrointestinal and Minimally Invasive Surgery  [facility] [address], [place], [place] [address] [phone] Fax [phone] Main/Paging  CONTACT INFORMATION: Weekday (9AM-5PM) concerns: Call [facility] main office at [phone] Weeknight (5PM-9AM) or Weekend/Holiday concerns: Check  [email or web address] for General Surgery [facility] coverage (Please, do not use SecureChat as it is not reliable communication to operating surgeons for immediate patient care)

## 2020-02-23 LAB — URINE CULTURE
MICRO NUMBER:: 11072115
SPECIMEN QUALITY:: ADEQUATE

## 2020-02-24 ENCOUNTER — Other Ambulatory Visit: Payer: Self-pay | Admitting: Family Medicine

## 2020-02-24 MED ORDER — AMOXICILLIN-POT CLAVULANATE 875-125 MG PO TABS: 1.0000 | ORAL_TABLET | Freq: Two times a day (BID) | ORAL | 0 refills | Status: DC

## 2020-02-25 ENCOUNTER — Other Ambulatory Visit: Payer: Self-pay | Admitting: Family Medicine

## 2020-02-26 ENCOUNTER — Ambulatory Visit: Payer: Medicare PPO | Admitting: Physician Assistant

## 2020-02-27 NOTE — Patient Instructions (Addendum)
DUE TO COVID-19 ONLY ONE VISITOR IS ALLOWED TO COME WITH YOU AND STAY IN THE WAITING ROOM ONLY DURING PRE OP AND PROCEDURE DAY OF SURGERY. THE 1 VISITOR  MAY VISIT WITH YOU AFTER SURGERY IN YOUR PRIVATE ROOM DURING VISITING HOURS ONLY!  YOU NEED TO HAVE A COVID 19 TEST ON_10/26______ @_3 :00______, THIS TEST MUST BE DONE BEFORE SURGERY,  COVID TESTING SITE [place] [address], IT IS ON THE RIGHT GOING OUT [address] APPROXIMATELY  2 MINUTES PAST [employer] ON THE RIGHT. ONCE YOUR COVID TEST IS COMPLETED,  PLEASE BEGIN THE QUARANTINE INSTRUCTIONS AS OUTLINED IN YOUR HANDOUT.                [name]    Your procedure is scheduled on: 03/07/20   Report to [facility] Main  Entrance   Report to admitting at  9:30 AM     Call this number if you have problems the morning of surgery [phone]   . BRUSH YOUR TEETH MORNING OF SURGERY AND RINSE YOUR MOUTH OUT, NO CHEWING GUM CANDY OR MINTS.    No food after midnight.    You may have clear liquid until 8:30 AM.     CLEAR LIQUID DIET   Foods Allowed                                                                     Foods Excluded  Coffee and tea, regular and decaf                             liquids that you cannot  Plain Jell-O any favor except red or purple                                           see through such as: Fruit ices (not with fruit pulp)                                     milk, soups, orange juice  Iced Popsicles                                    All solid food Carbonated beverages, regular and diet                                    Cranberry, grape and apple juices Sports drinks like Gatorade Lightly seasoned clear broth or consume(fat free) Sugar, honey syrup   At 8:30 AM drink pre surgery drink  . Nothing by mouth after 8:30 AM.   Take these medicines the morning of surgery with A SIP OF WATER: Gabapentin, Prozac, Atenolol              ,use your inhaler  and bring it with you      How to Manage Your Diabetes Before and  After Surgery  Why is it important to control my blood sugar before and after surgery? . Improving blood sugar levels before and after surgery helps healing and can limit problems. . A way of improving blood sugar control is eating a healthy diet by: o  Eating less sugar and carbohydrates o  Increasing activity/exercise o  Talking with your doctor about reaching your blood sugar goals . High blood sugars (greater than 180 mg/dL) can raise your risk of infections and slow your recovery, so you will need to focus on controlling your diabetes during the weeks before surgery. . Make sure that the doctor who takes care of your diabetes knows about your planned surgery including the date and location.  How do I manage my blood sugar before surgery? . Check your blood sugar at least 4 times a day, starting 2 days before surgery, to make sure that the level is not too high or low. o Check your blood sugar the morning of your surgery when you wake up and every 2 hours until you get to the Short Stay unit. . If your blood sugar is less than 70 mg/dL, you will need to treat for low blood sugar: o Do not take insulin. o Treat a low blood sugar (less than 70 mg/dL) with  cup of clear juice (cranberry or apple), 4 glucose tablets, OR glucose gel. o Recheck blood sugar in 15 minutes after treatment (to make sure it is greater than 70 mg/dL). If your blood sugar is not greater than 70 mg/dL on recheck, call [phone] for further instructions. . Report your blood sugar to the short stay nurse when you get to Short Stay.  . If you are admitted to the hospital after surgery: o Your blood sugar will be checked by the staff and you will probably be given insulin after surgery (instead of oral diabetes medicines) to make sure you have good blood sugar levels. o The goal for blood sugar control after surgery is 80-180 mg/dL.   WHAT DO I DO  ABOUT MY DIABETES MEDICATION?  [name] Do not take oral diabetes medicines (pills) the morning of surgery.  . The day of surgery, do not take other diabetes injectables, including Byetta (exenatide), Bydureon (exenatide ER), Victoza (liraglutide), or Trulicity (dulaglutide).                   You may not have any metal on your body including hair pins and              piercings  Do not wear jewelry, make-up, lotions, powders or perfumes, deodorant             Do not wear nail polish on your fingernails.  Do not shave  48 hours prior to surgery.     Do not bring valuables to the hospital. [place].  Contacts, dentures or bridgework may not be worn into surgery.      Patients discharged the day of surgery will not be allowed to drive home.   IF YOU ARE HAVING SURGERY AND GOING HOME THE SAME DAY, YOU MUST HAVE AN ADULT TO DRIVE YOU HOME AND BE WITH YOU FOR 24 HOURS.  YOU MAY GO HOME BY TAXI OR UBER OR ORTHERWISE, BUT AN ADULT MUST ACCOMPANY YOU HOME AND STAY WITH YOU FOR 24 HOURS.  Name and phone number of your driver:  Special Instructions: N/A              Please read over the following fact sheets you were given: _____________________________________________________________________             [facility] - Preparing for Surgery Before surgery, you can play an important role.  Because skin is not sterile, your skin needs to be as free of germs as possible.  You can reduce the number of germs on your skin by washing with CHG (chlorahexidine gluconate) soap before surgery.  CHG is an antiseptic cleaner which kills germs and bonds with the skin to continue killing germs even after washing. Please DO NOT use if you have an allergy to CHG or antibacterial soaps.  If your skin becomes reddened/irritated stop using the CHG and inform your nurse when you arrive at Short Stay. Do not shave (including legs and underarms) for at least 48 hours prior  to the first CHG shower.  You may shave your face/neck. Please follow these instructions carefully:  1.  Shower with CHG Soap the night before surgery and the  morning of Surgery.  2.  If you choose to wash your hair, wash your hair first as usual with your  normal  shampoo.  3.  After you shampoo, rinse your hair and body thoroughly to remove the  shampoo.                                        4.  Use CHG as you would any other liquid soap.  You can apply chg directly  to the skin and wash                       Gently with a scrungie or clean washcloth.  5.  Apply the CHG Soap to your body ONLY FROM THE NECK DOWN.   Do not use on face/ open                           Wound or open sores. Avoid contact with eyes, ears mouth and genitals (private parts).                       Wash face,  Genitals (private parts) with your normal soap.             6.  Wash thoroughly, paying special attention to the area where your surgery  will be performed.  7.  Thoroughly rinse your body with warm water from the neck down.  8.  DO NOT shower/wash with your normal soap after using and rinsing off  the CHG Soap.             9.  Pat yourself dry with a clean towel.            10.  Wear clean pajamas.            11.  Place clean sheets on your bed the night of your first shower and do not  sleep with pets. Day of Surgery : Do not apply any lotions/deodorants the morning of surgery.  Please wear clean clothes to the hospital/surgery center.  FAILURE TO FOLLOW THESE INSTRUCTIONS MAY RESULT IN THE CANCELLATION OF YOUR SURGERY PATIENT SIGNATURE_________________________________  NURSE SIGNATURE__________________________________  ________________________________________________________________________

## 2020-02-28 ENCOUNTER — Encounter (HOSPITAL_COMMUNITY)
Admission: RE | Admit: 2020-02-28 | Discharge: 2020-02-28 | Disposition: A | Discharge status: Home / Self Care | Payer: Medicare PPO | Source: Ambulatory Visit | Attending: Surgery | Admitting: Surgery

## 2020-02-28 ENCOUNTER — Encounter (HOSPITAL_COMMUNITY): Payer: Self-pay

## 2020-02-28 ENCOUNTER — Other Ambulatory Visit: Payer: Self-pay

## 2020-02-28 DIAGNOSIS — Z01812 Encounter for preprocedural laboratory examination: Secondary | ICD-10-CM | POA: Diagnosis not present

## 2020-02-28 HISTORY — DX: Dyspnea, unspecified: R06.00

## 2020-02-28 HISTORY — DX: Polyneuropathy in diseases classified elsewhere: G63

## 2020-02-28 LAB — CBC
HCT: 38.9 % (ref 36.0–46.0)
Hemoglobin: 12.5 g/dL (ref 12.0–15.0)
MCH: 31.3 pg (ref 26.0–34.0)
MCHC: 32.1 g/dL (ref 30.0–36.0)
MCV: 97.5 fL (ref 80.0–100.0)
Platelets: 237 10*3/uL (ref 150–400)
RBC: 3.99 MIL/uL (ref 3.87–5.11)
RDW: 13.8 % (ref 11.5–15.5)
WBC: 7.8 10*3/uL (ref 4.0–10.5)
nRBC: 0 % (ref 0.0–0.2)

## 2020-02-28 LAB — BASIC METABOLIC PANEL
Anion gap: 11 (ref 5–15)
BUN: 12 mg/dL (ref 8–23)
CO2: 29 mmol/L (ref 22–32)
Calcium: 9.4 mg/dL (ref 8.9–10.3)
Chloride: 103 mmol/L (ref 98–111)
Creatinine, Ser: 0.57 mg/dL (ref 0.44–1.00)
GFR, Estimated: >60 mL/min (ref >60)
Glucose, Bld: 104 mg/dL — ABNORMAL HIGH (ref 70–99)
Potassium: 4.1 mmol/L (ref 3.5–5.1)
Sodium: 143 mmol/L (ref 135–145)

## 2020-02-28 NOTE — Progress Notes (Signed)
COVID Vaccine Completed:yes Date COVID Vaccine completed:pt is not sure of date COVID vaccine manufacturer: Pfizer       PCP - Dr. Chauncey Cruel. hunter Cardiologist - Dr. Derl Barrow  Chest x-ray - 2019- Epic EKG - 11/22/19-Epic Stress Test - 2018-Epic ECHO - no Cardiac Cath -no  Pacemaker/ICD device last checked:NA  Sleep Study - yes- restless legs CPAP - no  Fasting Blood Sugar - 121-201 Checks Blood Sugar QD_____ times a day  Blood Thinner Instructions:NA Aspirin Instructions: Last Dose:  Anesthesia review:   Patient denies shortness of breath, fever, cough and chest pain at PAT appointmentyes   Patient verbalized understanding of instructions that were given to them at the PAT appointment. Patient was also instructed that they will need to review over the PAT instructions again at home before surgery. Yes  Pt uses a walker in her house and has a very sedentary life style.  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 She has SOB with most activities.

## 2020-03-04 ENCOUNTER — Other Ambulatory Visit (HOSPITAL_COMMUNITY)
Admission: RE | Admit: 2020-03-04 | Discharge: 2020-03-04 | Disposition: A | Discharge status: Home / Self Care | Payer: Medicare PPO | Source: Ambulatory Visit | Attending: Surgery | Admitting: Surgery

## 2020-03-04 DIAGNOSIS — Z01812 Encounter for preprocedural laboratory examination: Secondary | ICD-10-CM | POA: Diagnosis not present

## 2020-03-04 DIAGNOSIS — Z20822 Contact with and (suspected) exposure to covid-19: Secondary | ICD-10-CM | POA: Insufficient documentation

## 2020-03-04 LAB — SARS CORONAVIRUS 2 (TAT 6-24 HRS): SARS Coronavirus 2: NEGATIVE

## 2020-03-06 ENCOUNTER — Other Ambulatory Visit: Payer: Self-pay | Admitting: Family Medicine

## 2020-03-06 MED ORDER — BUPIVACAINE LIPOSOME 1.3 % IJ SUSP
20.0000 mL | Freq: Once | INTRAMUSCULAR | Status: DC
  Filled 2020-03-06: qty 20

## 2020-03-06 NOTE — Progress Notes (Signed)
Spoke with pt spouse Scientist, research (life sciences) for surgery tomorrow and he will stay, pt and spouse aware surgery moved to [facility] follow all other instructions given at 02-28-20 pre op.

## 2020-03-07 ENCOUNTER — Encounter (HOSPITAL_BASED_OUTPATIENT_CLINIC_OR_DEPARTMENT_OTHER)
Admission: RE | Disposition: A | Discharge status: Home / Self Care | Payer: Self-pay | Source: Ambulatory Visit | Attending: Surgery

## 2020-03-07 ENCOUNTER — Encounter (HOSPITAL_BASED_OUTPATIENT_CLINIC_OR_DEPARTMENT_OTHER): Payer: Self-pay | Admitting: Surgery

## 2020-03-07 ENCOUNTER — Other Ambulatory Visit: Payer: Self-pay

## 2020-03-07 ENCOUNTER — Ambulatory Visit (HOSPITAL_BASED_OUTPATIENT_CLINIC_OR_DEPARTMENT_OTHER): Payer: Medicare PPO | Admitting: Certified Registered Nurse Anesthetist

## 2020-03-07 ENCOUNTER — Ambulatory Visit (HOSPITAL_BASED_OUTPATIENT_CLINIC_OR_DEPARTMENT_OTHER)
Admission: RE | Admit: 2020-03-07 | Discharge: 2020-03-07 | Disposition: A | Discharge status: Home / Self Care | Payer: Medicare PPO | Source: Ambulatory Visit | Attending: Surgery | Admitting: Surgery

## 2020-03-07 DIAGNOSIS — Z8719 Personal history of other diseases of the digestive system: Secondary | ICD-10-CM | POA: Insufficient documentation

## 2020-03-07 DIAGNOSIS — Z888 Allergy status to other drugs, medicaments and biological substances status: Secondary | ICD-10-CM | POA: Insufficient documentation

## 2020-03-07 DIAGNOSIS — Z9013 Acquired absence of bilateral breasts and nipples: Secondary | ICD-10-CM | POA: Insufficient documentation

## 2020-03-07 DIAGNOSIS — Z90710 Acquired absence of both cervix and uterus: Secondary | ICD-10-CM | POA: Diagnosis not present

## 2020-03-07 DIAGNOSIS — Z881 Allergy status to other antibiotic agents status: Secondary | ICD-10-CM | POA: Diagnosis not present

## 2020-03-07 DIAGNOSIS — K641 Second degree hemorrhoids: Secondary | ICD-10-CM | POA: Diagnosis not present

## 2020-03-07 DIAGNOSIS — Z9049 Acquired absence of other specified parts of digestive tract: Secondary | ICD-10-CM | POA: Diagnosis not present

## 2020-03-07 DIAGNOSIS — J45909 Unspecified asthma, uncomplicated: Secondary | ICD-10-CM | POA: Diagnosis not present

## 2020-03-07 DIAGNOSIS — R32 Unspecified urinary incontinence: Secondary | ICD-10-CM | POA: Insufficient documentation

## 2020-03-07 DIAGNOSIS — Z8261 Family history of arthritis: Secondary | ICD-10-CM | POA: Insufficient documentation

## 2020-03-07 DIAGNOSIS — Z836 Family history of other diseases of the respiratory system: Secondary | ICD-10-CM | POA: Insufficient documentation

## 2020-03-07 DIAGNOSIS — K604 Rectal fistula: Secondary | ICD-10-CM | POA: Insufficient documentation

## 2020-03-07 DIAGNOSIS — Z87440 Personal history of urinary (tract) infections: Secondary | ICD-10-CM | POA: Insufficient documentation

## 2020-03-07 DIAGNOSIS — K620 Anal polyp: Secondary | ICD-10-CM | POA: Diagnosis not present

## 2020-03-07 DIAGNOSIS — K644 Residual hemorrhoidal skin tags: Secondary | ICD-10-CM | POA: Diagnosis not present

## 2020-03-07 DIAGNOSIS — E118 Type 2 diabetes mellitus with unspecified complications: Secondary | ICD-10-CM | POA: Insufficient documentation

## 2020-03-07 DIAGNOSIS — I10 Essential (primary) hypertension: Secondary | ICD-10-CM | POA: Insufficient documentation

## 2020-03-07 DIAGNOSIS — K580 Irritable bowel syndrome with diarrhea: Secondary | ICD-10-CM | POA: Insufficient documentation

## 2020-03-07 DIAGNOSIS — Z8249 Family history of ischemic heart disease and other diseases of the circulatory system: Secondary | ICD-10-CM | POA: Diagnosis not present

## 2020-03-07 DIAGNOSIS — Z885 Allergy status to narcotic agent status: Secondary | ICD-10-CM | POA: Diagnosis not present

## 2020-03-07 DIAGNOSIS — E119 Type 2 diabetes mellitus without complications: Secondary | ICD-10-CM | POA: Insufficient documentation

## 2020-03-07 DIAGNOSIS — K605 Anorectal fistula: Secondary | ICD-10-CM | POA: Diagnosis not present

## 2020-03-07 DIAGNOSIS — K603 Anal fistula: Secondary | ICD-10-CM | POA: Diagnosis not present

## 2020-03-07 HISTORY — PX: ANAL FISTULOTOMY: SHX6423

## 2020-03-07 HISTORY — PX: RECTAL EXAM UNDER ANESTHESIA: SHX6399

## 2020-03-07 LAB — GLUCOSE, CAPILLARY
Glucose-Capillary: 182 mg/dL — ABNORMAL HIGH (ref 70–99)
Glucose-Capillary: 161 mg/dL — ABNORMAL HIGH (ref 70–99)

## 2020-03-07 SURGERY — EXAM UNDER ANESTHESIA, RECTUM
Anesthesia: General | Site: Rectum

## 2020-03-07 MED ORDER — DIBUCAINE (PERIANAL) 1 % EX OINT
TOPICAL_OINTMENT | CUTANEOUS | Status: DC | PRN
  Administered 2020-03-07: 1 via RECTAL

## 2020-03-07 MED ORDER — OXYCODONE HCL 5 MG/5ML PO SOLN: 5.0000 mg | Freq: Once | ORAL | Status: DC | PRN

## 2020-03-07 MED ORDER — ROCURONIUM BROMIDE 10 MG/ML (PF) SYRINGE
PREFILLED_SYRINGE | INTRAVENOUS | Status: DC | PRN
  Administered 2020-03-07: 20 mg via INTRAVENOUS
  Administered 2020-03-07: 40 mg via INTRAVENOUS

## 2020-03-07 MED ORDER — METRONIDAZOLE IN NACL 5-0.79 MG/ML-% IV SOLN
500.0000 mg | INTRAVENOUS | Status: AC
  Administered 2020-03-07: 500 mg via INTRAVENOUS

## 2020-03-07 MED ORDER — DEXAMETHASONE SODIUM PHOSPHATE 10 MG/ML IJ SOLN
INTRAMUSCULAR | Status: DC | PRN
  Administered 2020-03-07: 5 mg via INTRAVENOUS

## 2020-03-07 MED ORDER — GABAPENTIN 300 MG PO CAPS
ORAL_CAPSULE | ORAL | Status: AC
  Filled 2020-03-07: qty 1

## 2020-03-07 MED ORDER — LIDOCAINE 2% (20 MG/ML) 5 ML SYRINGE
INTRAMUSCULAR | Status: AC
  Filled 2020-03-07: qty 5

## 2020-03-07 MED ORDER — CHLORHEXIDINE GLUCONATE CLOTH 2 % EX PADS: 6.0000 | MEDICATED_PAD | Freq: Once | CUTANEOUS | Status: DC

## 2020-03-07 MED ORDER — PROPOFOL 10 MG/ML IV BOLUS
INTRAVENOUS | Status: AC
  Filled 2020-03-07: qty 20

## 2020-03-07 MED ORDER — SODIUM CHLORIDE 0.9 % IV SOLN
INTRAVENOUS | Status: AC
  Filled 2020-03-07: qty 100

## 2020-03-07 MED ORDER — ONDANSETRON HCL 4 MG/2ML IJ SOLN: 4.0000 mg | Freq: Once | INTRAMUSCULAR | Status: DC | PRN

## 2020-03-07 MED ORDER — SODIUM CHLORIDE 0.9 % IV SOLN
2.0000 g | INTRAVENOUS | Status: AC
  Administered 2020-03-07: 2 g via INTRAVENOUS

## 2020-03-07 MED ORDER — CEFTRIAXONE SODIUM 2 G IJ SOLR
INTRAMUSCULAR | Status: AC
  Filled 2020-03-07: qty 20

## 2020-03-07 MED ORDER — HYDROCODONE-ACETAMINOPHEN 5-325 MG PO TABS: 1.0000 | ORAL_TABLET | Freq: Four times a day (QID) | ORAL | 0 refills | Status: DC | PRN

## 2020-03-07 MED ORDER — METHYLENE BLUE 0.5 % INJ SOLN
INTRAVENOUS | Status: DC | PRN
  Administered 2020-03-07: 2 mL

## 2020-03-07 MED ORDER — ORAL CARE MOUTH RINSE: 15.0000 mL | Freq: Once | OROMUCOSAL | Status: DC

## 2020-03-07 MED ORDER — PHENYLEPHRINE 40 MCG/ML (10ML) SYRINGE FOR IV PUSH (FOR BLOOD PRESSURE SUPPORT)
PREFILLED_SYRINGE | INTRAVENOUS | Status: AC
  Filled 2020-03-07: qty 10

## 2020-03-07 MED ORDER — SUGAMMADEX SODIUM 200 MG/2ML IV SOLN
INTRAVENOUS | Status: DC | PRN
  Administered 2020-03-07: 200 mg via INTRAVENOUS

## 2020-03-07 MED ORDER — METRONIDAZOLE IN NACL 5-0.79 MG/ML-% IV SOLN
INTRAVENOUS | Status: AC
  Filled 2020-03-07: qty 100

## 2020-03-07 MED ORDER — FENTANYL CITRATE (PF) 100 MCG/2ML IJ SOLN
INTRAMUSCULAR | Status: DC | PRN
  Administered 2020-03-07: 50 ug via INTRAVENOUS

## 2020-03-07 MED ORDER — GABAPENTIN 300 MG PO CAPS: 300.0000 mg | ORAL_CAPSULE | ORAL | Status: DC

## 2020-03-07 MED ORDER — ENSURE PRE-SURGERY PO LIQD: 296.0000 mL | Freq: Once | ORAL | Status: DC

## 2020-03-07 MED ORDER — ONDANSETRON HCL 4 MG/2ML IJ SOLN
INTRAMUSCULAR | Status: DC | PRN
  Administered 2020-03-07: 4 mg via INTRAVENOUS

## 2020-03-07 MED ORDER — AMISULPRIDE (ANTIEMETIC) 5 MG/2ML IV SOLN: 10.0000 mg | Freq: Once | INTRAVENOUS | Status: DC | PRN

## 2020-03-07 MED ORDER — PROPOFOL 10 MG/ML IV BOLUS
INTRAVENOUS | Status: DC | PRN
  Administered 2020-03-07: 150 mg via INTRAVENOUS

## 2020-03-07 MED ORDER — LACTATED RINGERS IV SOLN: INTRAVENOUS | Status: DC

## 2020-03-07 MED ORDER — CHLORHEXIDINE GLUCONATE 0.12 % MT SOLN: 15.0000 mL | Freq: Once | OROMUCOSAL | Status: DC

## 2020-03-07 MED ORDER — BUPIVACAINE-EPINEPHRINE 0.25% -1:200000 IJ SOLN
INTRAMUSCULAR | Status: DC | PRN
  Administered 2020-03-07: 20 mL

## 2020-03-07 MED ORDER — EPHEDRINE 5 MG/ML INJ
INTRAVENOUS | Status: AC
  Filled 2020-03-07: qty 10

## 2020-03-07 MED ORDER — FENTANYL CITRATE (PF) 100 MCG/2ML IJ SOLN
INTRAMUSCULAR | Status: AC
  Filled 2020-03-07: qty 2

## 2020-03-07 MED ORDER — OXYCODONE HCL 5 MG PO TABS: 5.0000 mg | ORAL_TABLET | Freq: Once | ORAL | Status: DC | PRN

## 2020-03-07 MED ORDER — BUPIVACAINE LIPOSOME 1.3 % IJ SUSP
INTRAMUSCULAR | Status: DC | PRN
  Administered 2020-03-07: 20 mL

## 2020-03-07 MED ORDER — SODIUM CHLORIDE 0.9 % IV SOLN: INTRAVENOUS | Status: DC

## 2020-03-07 MED ORDER — LIDOCAINE 2% (20 MG/ML) 5 ML SYRINGE
INTRAMUSCULAR | Status: DC | PRN
  Administered 2020-03-07: 80 mg via INTRAVENOUS

## 2020-03-07 MED ORDER — DEXAMETHASONE SODIUM PHOSPHATE 10 MG/ML IJ SOLN
INTRAMUSCULAR | Status: AC
  Filled 2020-03-07: qty 1

## 2020-03-07 MED ORDER — ROCURONIUM BROMIDE 10 MG/ML (PF) SYRINGE
PREFILLED_SYRINGE | INTRAVENOUS | Status: AC
  Filled 2020-03-07: qty 10

## 2020-03-07 MED ORDER — ONDANSETRON HCL 4 MG/2ML IJ SOLN
INTRAMUSCULAR | Status: AC
  Filled 2020-03-07: qty 2

## 2020-03-07 MED ORDER — PHENYLEPHRINE 40 MCG/ML (10ML) SYRINGE FOR IV PUSH (FOR BLOOD PRESSURE SUPPORT)
PREFILLED_SYRINGE | INTRAVENOUS | Status: DC | PRN
  Administered 2020-03-07: 80 ug via INTRAVENOUS
  Administered 2020-03-07: 120 ug via INTRAVENOUS
  Administered 2020-03-07 (×3): 80 ug via INTRAVENOUS

## 2020-03-07 MED ORDER — EPHEDRINE SULFATE 50 MG/ML IJ SOLN
INTRAMUSCULAR | Status: DC | PRN
  Administered 2020-03-07: 10 mg via INTRAVENOUS

## 2020-03-07 MED ORDER — FENTANYL CITRATE (PF) 100 MCG/2ML IJ SOLN: 25.0000 ug | INTRAMUSCULAR | Status: DC | PRN

## 2020-03-07 MED ORDER — ACETAMINOPHEN 500 MG PO TABS
1000.0000 mg | ORAL_TABLET | ORAL | Status: AC
  Administered 2020-03-07: 1000 mg via ORAL

## 2020-03-07 MED ORDER — ACETAMINOPHEN 500 MG PO TABS
ORAL_TABLET | ORAL | Status: AC
  Filled 2020-03-07: qty 2

## 2020-03-07 SURGICAL SUPPLY — 44 items
APL SKNCLS STERI-STRIP NONHPOA (GAUZE/BANDAGES/DRESSINGS) ×2
BENZOIN TINCTURE PRP APPL 2/3 (GAUZE/BANDAGES/DRESSINGS) ×3 IMPLANT
BLADE HEX COATED 2.75 (ELECTRODE) ×3 IMPLANT
BLADE SURG 15 STRL LF DISP TIS (BLADE) ×2 IMPLANT
BLADE SURG 15 STRL SS (BLADE) ×3
BRIEF STRETCH FOR OB PAD XXL (UNDERPADS AND DIAPERS) ×3 IMPLANT
COVER BACK TABLE 60X90IN (DRAPES) ×3 IMPLANT
COVER WAND RF STERILE (DRAPES) ×3 IMPLANT
DRAPE LAPAROTOMY 100X72 PEDS (DRAPES) ×3 IMPLANT
DRSG PAD ABDOMINAL 8X10 ST (GAUZE/BANDAGES/DRESSINGS) ×6 IMPLANT
ELECT REM PT RETURN 9FT ADLT (ELECTROSURGICAL) ×3
ELECTRODE REM PT RTRN 9FT ADLT (ELECTROSURGICAL) ×2 IMPLANT
GAUZE SPONGE 4X4 12PLY STRL (GAUZE/BANDAGES/DRESSINGS) ×3 IMPLANT
GLOVE BIO SURGEON STRL SZ7 (GLOVE) ×3 IMPLANT
GLOVE BIO SURGEON STRL SZ8 (GLOVE) ×3 IMPLANT
GLOVE BIOGEL PI IND STRL 7.5 (GLOVE) ×4 IMPLANT
GLOVE BIOGEL PI INDICATOR 7.5 (GLOVE) ×2
GLOVE ECLIPSE 7.5 STRL STRAW (GLOVE) ×3 IMPLANT
GLOVE INDICATOR 8.0 STRL GRN (GLOVE) ×3 IMPLANT
GOWN STRL REUS W/ TWL LRG LVL3 (GOWN DISPOSABLE) ×2 IMPLANT
GOWN STRL REUS W/TWL LRG LVL3 (GOWN DISPOSABLE) ×3
GOWN STRL REUS W/TWL XL LVL3 (GOWN DISPOSABLE) ×3 IMPLANT
IV CATH 14GX2 1/4 (CATHETERS) IMPLANT
IV CATH PLACEMENT 20 GA (IV SOLUTION) IMPLANT
KIT TURNOVER CYSTO (KITS) ×3 IMPLANT
LEGGING LITHOTOMY PAIR STRL (DRAPES) IMPLANT
LOOP VESSEL MAXI BLUE (MISCELLANEOUS) IMPLANT
MANIFOLD NEPTUNE II (INSTRUMENTS) ×3 IMPLANT
NEEDLE HYPO 22GX1.5 SAFETY (NEEDLE) ×3 IMPLANT
NS IRRIG 500ML POUR BTL (IV SOLUTION) ×3 IMPLANT
PACK BASIN DAY SURGERY FS (CUSTOM PROCEDURE TRAY) ×3 IMPLANT
PAD PREP 24X48 CUFFED NSTRL (MISCELLANEOUS) ×3 IMPLANT
PENCIL SMOKE EVACUATOR (MISCELLANEOUS) ×3 IMPLANT
SURGILUBE 2OZ TUBE FLIPTOP (MISCELLANEOUS) ×3 IMPLANT
SUT CHROMIC 2 0 SH (SUTURE) ×3 IMPLANT
SUT CHROMIC 3 0 SH 27 (SUTURE) ×3 IMPLANT
SUT VIC AB 2-0 UR6 27 (SUTURE) ×3 IMPLANT
SYR 20ML LL LF (SYRINGE) ×3 IMPLANT
TAPE CLOTH 3X10 TAN LF (GAUZE/BANDAGES/DRESSINGS) ×3 IMPLANT
TOWEL OR 17X26 10 PK STRL BLUE (TOWEL DISPOSABLE) ×3 IMPLANT
TRAY DSU PREP LF (CUSTOM PROCEDURE TRAY) ×3 IMPLANT
TUBE CONNECTING 12X1/4 (SUCTIONS) ×3 IMPLANT
UNDERPAD 30X36 HEAVY ABSORB (UNDERPADS AND DIAPERS) ×3 IMPLANT
YANKAUER SUCT BULB TIP NO VENT (SUCTIONS) ×3 IMPLANT

## 2020-03-07 NOTE — Op Note (Signed)
03/07/2020  12:49 PM  PATIENT:  [name]  79 y.o. female  Patient Care Team: Marin Olp, MD as PCP - General (Family Medicine) Michael Boston, MD as Consulting Physician (General Surgery) Mauri Pole, MD as Consulting Physician (Gastroenterology) Jerline Pain, MD as Consulting Physician (Cardiology) Gaynelle Arabian, MD as Consulting Physician (Orthopedic Surgery) Raynelle Bring, MD as Consulting Physician (Urology)  PRE-OPERATIVE DIAGNOSIS:  RECURRENT PERIRECTAL FISTULA  POST-OPERATIVE DIAGNOSIS:  RECURRENT SUPERFICIAL PERIRECTAL FISTULA  PROCEDURE:  ANORECTAL EXAM UNDER ANESTHESIA ANAL FISTULOTOMY WITH MARSUPIALIZATION  SURGEON:  Adin Hector, MD  ASSISTANT: OR Staff   ANESTHESIA:   General Anorectal & Local field block (0.25% bupivacaine with epinephrine mixed with Liposomal bupivacaine (Experel)    EBL:  Total I/O In: 200 [IV Piggyback:200] Out: 20 [Blood:20].  See anesthesia record  Delay start of Pharmacological VTE agent (>24hrs) due to surgical blood loss or risk of bleeding:  no  DRAINS: none   SPECIMEN:  Source of Specimen:  Superficial anal canal  DISPOSITION OF SPECIMEN:  PATHOLOGY  COUNTS:  YES  PLAN OF CARE: Discharge to home after PACU  PATIENT DISPOSITION:  PACU - hemodynamically stable.  INDICATION:   Pleasant woman with intersphincteric fistula requiring LIFT repair earlier this year. Initially healing well but then developed worsening purulent drainage. Recurrent fistula suspected.  I recommended examination and surgical treatment:  The anatomy & physiology of the anorectal region was discussed.  We discussed the pathophysiology of anorectal abscess and fistula.  Differential diagnosis was discussed.  Natural history progression was discussed.   I stressed the importance of a bowel regimen to have daily soft bowel movements to minimize progression of disease.     The patient's condition is not adequately controlled.   Non-operative treatment has not healed the fistula.  Therefore, I recommended examination under anaesthesia to confirm the diagnosis and treat the fistula.  I discussed techniques that may be required such as fistulotomy, ligation by LIFT technique, and/or seton placement.  Benefits & alternatives discussed.  I noted a good likelihood this will help address the problem, but sometimes repeat operations and prolonged healing times may occur.  Risks such as bleeding, pain, recurrence, reoperation, incontinence, heart attack, death, and other risks were discussed.      Educational handouts further explaining the pathology, treatment options, and bowel regimen were given.  The patient expressed understanding & wishes to proceed.  We will work to coordinate surgery for a mutually convenient time.   OR FINDINGS: Patient had a superficial anal fistula.    External location LEFT ANTERIOR  about 3 cm from anal verge.  Internal location : Anterior midline analverge about 0 cm from anal verge.  DESCRIPTION:   Informed consent was confirmed. Patient underwent general anesthesia without difficulty. Patient was placed into prone / jackknife positioning.  The perianal region was prepped and draped in sterile fashion. Surgical timeout confirmed or plan.  I did digital rectal examination and then transitioned over to anoscopy to get a sense of the anatomy.  I did place a probe through the external opening it did not seem to easily probed more proximally. There seemed to be a thinned out area at the anal verge superficial to the sphincters that seem suspicious but I could not probe easily into it so I held off on more aggressive probing. Therefore, I injected the track with methylene blue.  With this I was able to locate an internal opening at the anterior midline anal verge just superficial  to the sphincter complex.  No abscess located.  With methylene blue lighting the way I was able to get the probe to gently find  the internal opening. I confirmed it was superficial to the sphincters. I transected through the anoderm and perirectal tissues to open up and perform fistulotomy. I excised the edges of the wound. This was in the left anterior region going up to the base of the introitus and left labia. Quite swollen and folded. I ended up excising more skin and soft tissue to have a very open broad crater wound. Because it seemed likely it could re-tunnel, I performed marsupialization around the edges of the fistulotomy wound with 3-0 chromic suture starting at the anterior midline anal verge for medial half the circumference. Then a separate running stitch on the lateral circumference. This allowed a marsupialization. I assured hemostasis. Deepest aspect was 1 cm proximal to the sphincters but again no other tunneling or abnormalities. Sphincter complex intact with no evidence of injury or defect.  No evidence of rectovaginal septum or other concerns.  I reexamined the anal canal.   There is was no narrowing.  Hemostasis was excellent.  I repeated anoscopy and examination.  Hemostasis was good.  We placed fluff gauze to onlay over the wound.  No packing done.  Patient is being extubated go to recovery room.  I discussed operative findings, updated the patient's status, discussed probable steps to recovery, and gave postoperative recommendations to the patient's spouse.  Recommendations were made.  Questions were answered.  He expressed understanding & appreciation.   Adin Hector, M.D., F.A.C.S. Gastrointestinal and Minimally Invasive Surgery [facility] [address] [place], [place] [address] [phone] Main / Paging

## 2020-03-07 NOTE — Anesthesia Preprocedure Evaluation (Signed)
Anesthesia Evaluation  Patient identified by MRN, date of birth, ID band Patient awake    Reviewed: Allergy & Precautions, NPO status , Patient's Chart, lab work & pertinent test results, reviewed documented beta blocker date and time   History of Anesthesia Complications Negative for: history of anesthetic complications  Airway Mallampati: I  TM Distance: >3 FB Neck ROM: Full    Dental no notable dental hx. (+) Teeth Intact, Chipped,    Pulmonary asthma ,    Pulmonary exam normal        Cardiovascular hypertension, Pt. on medications and Pt. on home beta blockers Normal cardiovascular exam     Neuro/Psych  Headaches, PSYCHIATRIC DISORDERS Anxiety Depression    GI/Hepatic GERD  ,  Endo/Other  diabetes, Type 2  Renal/GU      Musculoskeletal  (+) Arthritis ,   Abdominal (+) + obese,   Peds  Hematology  (+) Blood dyscrasia, anemia ,   Anesthesia Other Findings   Reproductive/Obstetrics                            Anesthesia Physical  Anesthesia Plan  ASA: III  Anesthesia Plan: General   Post-op Pain Management:    Induction: Intravenous  PONV Risk Score and Plan: 3 and Treatment may vary due to age or medical condition, Ondansetron and Dexamethasone  Airway Management Planned: Oral ETT  Additional Equipment: None  Intra-op Plan:   Post-operative Plan: Extubation in OR  Informed Consent: I have reviewed the patients History and Physical, chart, labs and discussed the procedure including the risks, benefits and alternatives for the proposed anesthesia with the patient or authorized representative who has indicated his/her understanding and acceptance.     Dental advisory given  Plan Discussed with: CRNA  Anesthesia Plan Comments:        Anesthesia Quick Evaluation

## 2020-03-07 NOTE — Anesthesia Procedure Notes (Signed)
Procedure Name: Intubation Date/Time: 03/07/2020 11:33 AM Performed by: Rogers Blocker, CRNA Pre-anesthesia Checklist: Patient identified, Emergency Drugs available, Suction available and Patient being monitored Patient Re-evaluated:Patient Re-evaluated prior to induction Oxygen Delivery Method: Circle System Utilized Preoxygenation: Pre-oxygenation with 100% oxygen Induction Type: IV induction Ventilation: Mask ventilation without difficulty Laryngoscope Size: Mac and 3 Grade View: Grade I Tube type: Oral Number of attempts: 1 Airway Equipment and Method: Stylet and Bite block Placement Confirmation: ETT inserted through vocal cords under direct vision,  positive ETCO2 and breath sounds checked- equal and bilateral Secured at: 22 cm Tube secured with: Tape Dental Injury: Teeth and Oropharynx as per pre-operative assessment

## 2020-03-07 NOTE — Discharge Instructions (Signed)
Information for Discharge Teaching: EXPAREL (bupivacaine liposome injectable suspension)   Your surgeon gave you EXPAREL(bupivacaine) in your surgical incision to help control your pain after surgery.   EXPAREL is a local anesthetic that provides pain relief by numbing the tissue around the surgical site.  EXPAREL is designed to release pain medication over time and can control pain for up to 72 hours.  Depending on how you respond to EXPAREL, you may require less pain medication during your recovery.  Possible side effects:  Temporary loss of sensation or ability to move in the area where bupivacaine was injected.  Nausea, vomiting, constipation  Rarely, numbness and tingling in your mouth or lips, lightheadedness, or anxiety may occur.  Call your doctor right away if you think you may be experiencing any of these sensations, or if you have other questions regarding possible side effects.  Follow all other discharge instructions given to you by your surgeon or nurse. Eat a healthy diet and drink plenty of water or other fluids.  If you return to the hospital for any reason within 96 hours following the administration of EXPAREL, please inform your health care providers.  Post Anesthesia Home Care Instructions  Activity: Get plenty of rest for the remainder of the day. A responsible adult should stay with you for 24 hours following the procedure.  For the next 24 hours, DO NOT: -Drive a car -Paediatric nurse -Drink alcoholic beverages -Take any medication unless instructed by your physician -Make any legal decisions or sign important papers.  Meals: Start with liquid foods such as gelatin or soup. Progress to regular foods as tolerated. Avoid greasy, spicy, heavy foods. If nausea and/or vomiting occur, drink only clear liquids until the nausea and/or vomiting subsides. Call your physician if vomiting continues.  Special Instructions/Symptoms: Your throat may feel dry or sore  from the anesthesia or the breathing tube placed in your throat during surgery. If this causes discomfort, gargle with warm salt water. The discomfort should disappear within 24 hours.  If you had a scopolamine patch placed behind your ear for the management of post- operative nausea and/or vomiting:  1. The medication in the patch is effective for 72 hours, after which it should be removed.  Wrap patch in a tissue and discard in the trash. Wash hands thoroughly with soap and water. 2. You may remove the patch earlier than 72 hours if you experience unpleasant side effects which may include dry mouth, dizziness or visual disturbances. 3. Avoid touching the patch. Wash your hands with soap and water after contact with the patch.

## 2020-03-07 NOTE — Interval H&P Note (Signed)
History and Physical Interval Note:  03/07/2020 11:19 AM  [name]  has presented today for surgery, with the diagnosis of RECURRENT PERIRECTAL FISTULA.  The various methods of treatment have been discussed with the patient and family. After consideration of risks, benefits and other options for treatment, the patient has consented to  Procedure(s): ANORECTAL EXAM UNDER ANESTHESIA (N/A) REPAIR OF PERIRECTAL FISTULA (N/A) POSSIBLE PLACEMENT OF SETON (N/A) as a surgical intervention.  The patient's history has been reviewed, patient examined, no change in status, stable for surgery.  I have reviewed the patient's chart and labs.  Questions were answered to the patient's satisfaction.    I have re-reviewed the the patient's records, history, medications, and allergies.  I have re-examined the patient.  I again discussed intraoperative plans and goals of post-operative recovery.  The patient agrees to proceed.  [name]  February 15, 1941 [id number]  Patient Care Team: Olivia Olp, MD as PCP - General (Family Medicine) Olivia Boston, MD as Consulting Physician (General Surgery) Olivia Pole, MD as Consulting Physician (Gastroenterology) Jerline Pain, MD as Consulting Physician (Cardiology) Gaynelle Arabian, MD as Consulting Physician (Orthopedic Surgery) Raynelle Bring, MD as Consulting Physician (Urology)  Patient Active Problem List   Diagnosis Date Noted   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 11/22/2019   Healthcare maintenance 02/22/2019   Physical deconditioning 02/22/2019   Shortness of breath 02/22/2019   Adenomatous polyp 12/20/2018   Pain due to onychomycosis of toenails of both feet 11/08/2018   Diabetic neuropathy ([place]) 11/08/2018   History of total knee replacement, bilateral 11/14/2017   Personal history of colonic polyps 05/18/2017   Arthritis of midfoot 03/03/2016   Hammertoes of both feet 03/03/2016   Aortic atherosclerosis ([place])  02/16/2016   Solitary pulmonary nodule 01/08/2016   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   Bronchiectasis ([place]) 10/18/2015   IBS (irritable bowel syndrome) 07/17/2014   Recurrent major depression in partial remission ([place]) 01/03/2014   Posterior tibial tendon dysfunction 01/03/2014   Osteoarthritis, knee 01/03/2014   Glaucoma 01/03/2014   Hypertension associated with diabetes ([place]) 01/03/2014   Obesity (BMI 30-39.9) 06/15/2013   Primary open-angle glaucoma 08/02/2012   Foot tendinitis 07/20/2010   INSOMNIA, CHRONIC 07/25/2009   Fatty liver 03/18/2009   GERD 12/25/2007   Hyperlipidemia associated with type 2 diabetes mellitus ([place]) 07/26/2007   ANEMIA, B12 DEFICIENCY 12/29/2006   Diabetes mellitus type II, controlled ([place]) 12/28/2006   RESTLESS LEG SYNDROME, MILD 12/28/2006   NEUROPATHY, IDIOPATHIC PERIPHERAL NEC 12/28/2006   Osteoporosis 12/12/2006   BREAST CANCER, HX OF 12/12/2006    Past Medical History:  Diagnosis Date   Allergy    Anemia    Anxiety    Arthritis    right knee;injections every 70month    Asthma    use daily   Breast cancer ([place] 1994[place]   HX BREAST CANCER/ right brreast and left breast in 1995   Bron[place]ctasis ([place]    COMPRESSION FRACTURE, LUMBAR VERTEBRAE 08/21/2008   Qualifier: Diagnosis of  By: JArnoldo MoraleMD, JBalinda Quails   Depression    Diabetes mellitus    takes Amaryl and Januvia daily   Difficulty sleeping    Diverticulitis    Dyspnea    with activity   Early cataracts, bilateral    Eczema    Endometriosis    Fatty liver    Gastritis    Glaucoma    Hammer toe    History of kidney stones    Hyperlipidemia associated with type  2 diabetes mellitus ([place]) 07/26/2007   Diet/exercise control.     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   Thyroid disease    Vitamin D deficiency    takes Vit d daily   Weakness    uses a walker and wheelchair    Past Surgical History:  Procedure Laterality Date   ADENOIDECTOMY     at age 62   BIOPSY  12/19/2018   Procedure: BIOPSY;  Surgeon: Olivia Pole, MD;  [facility]: [facility];  Service: Endoscopy;;   CATARACT EXTRACTION     CHOLECYSTECTOMY  06/2008   COLONOSCOPY WITH PROPOFOL N/A 05/17/2017   Procedure: COLONOSCOPY WITH PROPOFOL;  Surgeon: Olivia Pole, MD;  [facility]: [facility];  Service: Endoscopy;  Laterality: N/A;  PT WILL BE ADMITTED THE DAY BEFORE FOR PREP PER ROBIN KB   COLONOSCOPY WITH PROPOFOL N/A 12/19/2018   Procedure: COLONOSCOPY WITH PROPOFOL;  Surgeon: Olivia Pole, MD;  [facility]: [facility];  Service: Endoscopy;  Laterality: N/A;   DILATION AND CURETTAGE OF UTERUS     excision on breast     internal infected suture from breast surgery   excision removed from neck     infected lymph node   FOOT SURGERY     left foot-cleaning out areas on toes at the wound center-having MRI to determine if osteomyelitis   HEMORRHOID SURGERY N/A 11/22/2019   Procedure: [place] , PEXY;  Surgeon: Olivia Boston, MD;  Location: [facility];  Service: General;  Laterality: N/A;   HYPERBARIC OXYGEN THERAPY     FEET INFECTION   HYSTEROSCOPY  06/22/11   PMB submucosal myoma   JOINT REPLACEMENT     left total shoulder   LAPAROSCOPIC OOPHORECTOMY Right 12/2004   absent LSO   LAPAROTOMY     LIGATION OF INTERNAL FISTULA TRACT N/A 11/22/2019   Procedure:  REPAIR OF PERIRECTAL FISTULA, ANORECTAL EXAMINATION UNDER ANESTHESIA;  Surgeon: Olivia Boston, MD;  Location: [facility];  Service: General;  Laterality: N/A;   MASTECTOMY Bilateral 1994 right, 1995 left   POLYPECTOMY  12/19/2018   Procedure: POLYPECTOMY;  Surgeon: Olivia Pole, MD;  [facility];  Service: Endoscopy;;   SHOULDER HEMI-ARTHROPLASTY  01/01/2012   Procedure: SHOULDER HEMI-ARTHROPLASTY;  Surgeon: Roseanne Kaufman, MD;  Location: [facility];  Service: Orthopedics;  Laterality: Left;  Left Shoulder Hemi Arthroplasty with Repair and Reconstruction as Necessary    THYROIDECTOMY     30yr ago. follows endocrine   TONSILLECTOMY     TOTAL KNEE ARTHROPLASTY Right 12/30/2014   Procedure: RIGHT TOTAL KNEE ARTHROPLASTY;  Surgeon: FGaynelle Arabian MD;  [facility];  Service: Orthopedics;  Laterality: Right;   TOTAL KNEE ARTHROPLASTY Left  11/29/2016   Procedure: LEFT TOTAL KNEE ARTHROPLASTY;  Surgeon: Gaynelle Arabian, MD;  [facility];  Service: Orthopedics;  Laterality: Left;    Social History   Socioeconomic History   Marital status: Married    Spouse name: Not on file   Number of children: 1   Years of education: Not on file   Highest education level: Not on file  Occupational History   Occupation: retired  Tobacco Use   Smoking status: Never Smoker   Smokeless tobacco: Never Used  Scientific laboratory technician Use: Never used  Substance and Sexual Activity   Alcohol use: No   Drug use: No   Sexual activity: Not Currently    Partners: Male    Birth control/protection: Post-menopausal, Surgical    Comment: partial hysterectomy  Other Topics Concern   Not on file  Social History Narrative   Married 39 years in 2015. 1 adopted son. 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, [employer], sewing, decorate   Social Determinants of Radio broadcast assistant Strain:    Difficulty of Paying Living Expenses: Not on  file  Food Insecurity:    Worried About Charity fundraiser in the Last Year: Not on file   [place] in the Last Year: Not on file  Transportation Needs:    Lack of Transportation (Medical): Not on file   Lack of Transportation (Non-Medical): Not on file  Physical Activity:    Days of Exercise per Week: Not on file   Minutes of Exercise per Session: Not on file  Stress:    Feeling of Stress : Not on file  Social Connections:    Frequency of Communication with Friends and Family: Not on file   Frequency of Social Gatherings with Friends and Family: Not on file   Attends Religious Services: Not on file   Active Member of Clubs or Organizations: Not on file   Attends Archivist Meetings: Not on file   Marital Status: Not on file  Intimate Partner Violence:    Fear of Current or Ex-Partner: Not on file   Emotionally Abused: Not on file   Physically Abused: Not on file   Sexually Abused: Not on file    Family History  Problem Relation Age of Onset   Arthritis Mother    COPD Father    Heart disease Father        MI 42 - states he died of lockjaw   Hypertension Father    Hyperlipidemia Father    Pancreatic cancer Paternal Grandfather    Colon cancer Neg Hx    Esophageal cancer Neg Hx    Rectal cancer Neg Hx    Stomach cancer Neg Hx     Medications Prior to Admission  Medication Sig Dispense Refill Last Dose   amoxicillin-clavulanate (AUGMENTIN) 875-125 MG tablet Take 1 tablet by mouth 2 (two) times daily. 20 tablet 0 03/06/2020 at Unknown time   atenolol (TENORMIN) 25 MG tablet Take 1 tablet by mouth once daily 90 tablet 0 03/07/2020 at 0800   Bacillus Coagulans-Inulin (ALIGN PREBIOTIC-PROBIOTIC PO) Take 1 capsule by mouth daily.    03/06/2020 at Unknown time   cetirizine (ZYRTEC) 10 MG tablet Take 10 mg by mouth every morning.    03/06/2020 at Unknown time   Cholecalciferol (VITAMIN D3) 125 MCG (5000 UT) TABS Take 5,000 Units by mouth daily.  03/06/2020 at  Unknown time   Coenzyme Q10 (COQ10) 200 MG CAPS Take 200 mg by mouth daily.   03/06/2020 at Unknown time   colesevelam (WELCHOL) 625 MG tablet Take 1 tablet (625 mg total) by mouth in the morning, at noon, and at bedtime. 90 tablet 3 03/06/2020 at Unknown time   dicyclomine (BENTYL) 20 MG tablet Take 1 tablet (20 mg total) by mouth in the morning, at noon, in the evening, and at bedtime. 120 tablet 5 03/06/2020 at Unknown time   dorzolamide-timolol (COSOPT) 22.3-6.8 MG/ML ophthalmic solution Place 1 drop into both eyes 2 (two) times daily.    03/06/2020 at Unknown time   ESTRING 2 MG vaginal ring INSERT 1 RING  INTO VAGINA EVERY 3 MONTHS (Patient taking differently: Place 2 mg vaginally every 3 (three) months. ) 1 each 3 03/07/2020 at n   FIBER PO Take 3 tablets by mouth every evening.   Past Week at Unknown time   FLUoxetine (PROZAC) 40 MG capsule Take 1 capsule by mouth once daily (Patient taking differently: Take 40 mg by mouth daily. ) 90 capsule 0 03/07/2020 at 0800   fluticasone (FLONASE) 50 MCG/ACT nasal spray Place 2 sprays into both nostrils daily. 16 g 11 03/07/2020 at 0800   gabapentin (NEURONTIN) 100 MG capsule Take 1 capsule (100 mg total) by mouth 3 (three) times daily as needed (neuropathic pain). 90 capsule 3 03/07/2020 at 0800   glimepiride (AMARYL) 4 MG tablet TAKE 2 TABLETS BY MOUTH ONCE DAILY WITH BREAKFAST (Patient taking differently: Take 8 mg by mouth daily with breakfast. ) 180 tablet 0 03/06/2020 at Unknown time   glucose blood (ACCU-CHEK GUIDE) test strip Use to test blood sugars daily. Dx: E11.9 100 each 6 03/07/2020 at Unknown time   latanoprost (XALATAN) 0.005 % ophthalmic solution Place 1 drop into the right eye at bedtime.    03/06/2020 at Unknown time   lisinopril (ZESTRIL) 10 MG tablet Take 1 tablet by mouth once daily 90 tablet 0 03/06/2020 at Unknown time   lovastatin (MEVACOR) 10 MG tablet Take 1 tablet (10 mg total) by mouth every other day. 45 tablet 3 03/06/2020  at Unknown time   Multiple Vitamin (MULTIVITAMIN WITH MINERALS) TABS tablet Take 1 tablet by mouth daily. Centrum Silver   03/06/2020 at Unknown time   pramipexole (MIRAPEX) 0.5 MG tablet TAKE 1 TABLET BY MOUTH AT BEDTIME (Patient taking differently: Take 0.5 mg by mouth at bedtime. ) 90 tablet 0 03/06/2020 at Unknown time   ROCKLATAN 0.02-0.005 % SOLN Place 1 drop into the left eye at bedtime.    03/06/2020 at Unknown time   Selenium 200 MCG CAPS Take 200 mcg by mouth at bedtime.    03/06/2020 at Unknown time   SYMBICORT 160-4.5 MCG/ACT inhaler INHALE 2 PUFFS BY MOUTH TWICE DAILY . (Patient taking differently: Inhale 2 puffs into the lungs in the morning and at bedtime. ) 10.2 g 11 03/07/2020 at 0800   temazepam (RESTORIL) 15 MG capsule TAKE 1 CAPSULE BY MOUTH AT BEDTIME AS NEEDED FOR SLEEP (Patient taking differently: Take 15 mg by mouth at bedtime. ) 30 capsule 5 03/06/2020 at Unknown time   VICTOZA 18 MG/3ML SOPN INJECT 1.8MG INTO THE  SKIN ONCE DAILY (Patient taking differently: Inject 1.8 mg into the skin daily. ) 9 mL 0 03/06/2020 at Unknown time   vitamin B-12 (CYANOCOBALAMIN) 1000 MCG tablet Take 1,000 mcg by mouth daily.   03/06/2020 at Unknown time   blood glucose meter  kit and supplies KIT Dispense based on patient and insurance preference. Use up to four times daily as directed. Dx E11.9 1 each 0    famotidine (PEPCID) 20 MG tablet Take 1 tablet (20 mg total) by mouth daily as needed for heartburn or indigestion. (Patient not taking: Reported on 02/27/2020)   Not Taking at Unknown time   fluconazole (DIFLUCAN) 150 MG tablet TAKE ONE TABLET BY MOUTH AS A ONE-TIME DOSE (Patient taking differently: Take 150 mg by mouth daily as needed (symptoms of yeast). ) 1 tablet 0 More than a month at Unknown time   glucose blood test strip Use to test up to twice a day 200 each 1    hydrocortisone 2.5 % cream Apply 1 application topically 2 (two) times daily as needed (skin irritation/rash).  (Patient not  taking: Reported on 02/27/2020)   Not Taking at Unknown time   Lancets Misc. (ACCU-CHEK FASTCLIX LANCET) KIT Use to test blood sugars daily. Dx: E11.9 1 kit 5    Misc Natural Products (NEURIVA PO) Take by mouth. (Patient not taking: Reported on 02/27/2020)   Not Taking at Unknown time   Respiratory Therapy Supplies (FLUTTER) DEVI Use as directed 1 each 0    UNABLE TO FIND Fiber therapy (Patient not taking: Reported on 02/27/2020)   Not Taking at Unknown time    Current Facility-Administered Medications  Medication Dose Route Frequency Provider Last Rate Last Admin   0.9 %  sodium chloride infusion   Intravenous Continuous Lidia Collum, MD 50 mL/hr at 03/07/20 1045 New Bag at 03/07/20 1045   bupivacaine liposome (EXPAREL) 1.3 % injection 266 mg  20 mL Infiltration Once Olivia Boston, MD       cefTRIAXone (ROCEPHIN) 2 g in sodium chloride 0.9 % 100 mL IVPB  2 g Intravenous On Call to OR Olivia Boston, MD       And   metroNIDAZOLE (FLAGYL) IVPB 500 mg  500 mg Intravenous On Call to OR Olivia Boston, MD       chlorhexidine (PERIDEX) 0.12 % solution 15 mL  15 mL Mouth/Throat Once Lillia Abed, MD       Or   MEDLINE mouth rinse  15 mL Mouth Rinse Once Lillia Abed, MD       Chlorhexidine Gluconate Cloth 2 % PADS 6 each  6 each Topical Once Olivia Boston, MD       And   Chlorhexidine Gluconate Cloth 2 % PADS 6 each  6 each Topical Once Olivia Boston, MD       Derrill Memo ON 03/08/2020] feeding supplement (ENSURE PRE-SURGERY) liquid 296 mL  296 mL Oral Once Olivia Boston, MD       gabapentin (NEURONTIN) capsule 300 mg  300 mg Oral On Call to OR Olivia Boston, MD         Allergies  Allergen Reactions   Nitrofurantoin Other (See Comments)    Severe headache HEADACHE   Levaquin [Levofloxacin In D5w] Other (See Comments)    Pain in tendons    Brimonidine Other (See Comments)    Made eyes and surrounding areas RED/ was an eye drop   Cephalexin Diarrhea and Other (See Comments)    Patient can't  remember reaction ([facility]) (tolerates Augmentin fine)   Erythromycin Ethylsuccinate Hives and Diarrhea   Oxycodone Itching    BP (!) 145/57   Pulse 79   Temp 98 F (36.7 C) (Oral)   Resp 17   Ht 6' (1.829  m)   Wt 113.6 kg   LMP 05/10/1992 Comment: partial  SpO2 95%   BMI 33.97 kg/m   Labs: Results for orders placed or performed during the hospital encounter of 03/07/20 (from the past 48 hour(s))  Glucose, capillary     Status: Abnormal   Collection Time: 03/07/20 10:53 AM  Result Value Ref Range   Glucose-Capillary 182 (H) 70 - 99 mg/dL    Comment: Glucose reference range applies only to samples taken after fasting for at least 8 hours.    Imaging / Studies: No results found.   Adin Hector, M.D., F.A.C.S. Gastrointestinal and Minimally Invasive Surgery [facility] [address] [place], [place] [address] [phone] Main / Paging  03/07/2020 11:19 AM    Adin Hector

## 2020-03-07 NOTE — Transfer of Care (Signed)
Immediate Anesthesia Transfer of Care Note  Patient: HANIYAH MACIOLEK  Procedure(s) Performed: ANORECTAL EXAM UNDER ANESTHESIA (N/A Rectum) ANAL FISTULOTOMY WITH MARSUPIALIZATION (N/A Rectum)  Patient Location: PACU  Anesthesia Type:General  Level of Consciousness: awake, alert , oriented and patient cooperative  Airway & Oxygen Therapy: Patient Spontanous Breathing and Patient connected to nasal cannula oxygen  Post-op Assessment: Report given to RN and Post -op Vital signs reviewed and stable  Post vital signs: Reviewed and stable  Last Vitals:  Vitals Value Taken Time  BP 102/69 03/07/20 1253  Temp    Pulse 75 03/07/20 1256  Resp 18 03/07/20 1256  SpO2 99 % 03/07/20 1256  Vitals shown include unvalidated device data.  Last Pain:  Vitals:   03/07/20 1028  TempSrc: Oral  PainSc: 0-No pain      Patients Stated Pain Goal: 5 (20/60/15 6153)  Complications: No complications documented.

## 2020-03-07 NOTE — Anesthesia Postprocedure Evaluation (Signed)
Anesthesia Post Note  Patient: CIELA MAHAJAN  Procedure(s) Performed: ANORECTAL EXAM UNDER ANESTHESIA (N/A Rectum) ANAL FISTULOTOMY WITH MARSUPIALIZATION (N/A Rectum)     Patient location during evaluation: PACU Anesthesia Type: General Level of consciousness: awake and alert Pain management: pain level controlled Vital Signs Assessment: post-procedure vital signs reviewed and stable Respiratory status: spontaneous breathing, nonlabored ventilation and respiratory function stable Cardiovascular status: blood pressure returned to baseline and stable Postop Assessment: no apparent nausea or vomiting Anesthetic complications: no   No complications documented.  Last Vitals:  Vitals:   03/07/20 1351 03/07/20 1352  BP:    Pulse: 78 78  Resp: 17 (!) 23  Temp:  36.7 C  SpO2: 96% 94%    Last Pain:  Vitals:   03/07/20 1352  TempSrc:   PainSc: 0-No pain                 Lidia Collum

## 2020-03-10 ENCOUNTER — Encounter (HOSPITAL_BASED_OUTPATIENT_CLINIC_OR_DEPARTMENT_OTHER): Payer: Self-pay | Admitting: Surgery

## 2020-03-10 ENCOUNTER — Other Ambulatory Visit: Payer: Self-pay | Admitting: Family Medicine

## 2020-03-10 LAB — SURGICAL PATHOLOGY

## 2020-03-24 ENCOUNTER — Other Ambulatory Visit: Payer: Self-pay | Admitting: Physician Assistant

## 2020-04-02 DIAGNOSIS — L84 Corns and callosities: Secondary | ICD-10-CM | POA: Diagnosis not present

## 2020-04-02 DIAGNOSIS — L603 Nail dystrophy: Secondary | ICD-10-CM | POA: Diagnosis not present

## 2020-04-02 DIAGNOSIS — E1151 Type 2 diabetes mellitus with diabetic peripheral angiopathy without gangrene: Secondary | ICD-10-CM | POA: Diagnosis not present

## 2020-04-02 DIAGNOSIS — I739 Peripheral vascular disease, unspecified: Secondary | ICD-10-CM | POA: Diagnosis not present

## 2020-04-10 DIAGNOSIS — L820 Inflamed seborrheic keratosis: Secondary | ICD-10-CM | POA: Diagnosis not present

## 2020-04-10 DIAGNOSIS — L650 Telogen effluvium: Secondary | ICD-10-CM | POA: Diagnosis not present

## 2020-04-14 ENCOUNTER — Ambulatory Visit: Payer: Medicare PPO | Admitting: Gastroenterology

## 2020-04-18 ENCOUNTER — Other Ambulatory Visit: Payer: Self-pay | Admitting: Family Medicine

## 2020-04-19 NOTE — Patient Instructions (Addendum)
Health Maintenance Due  Topic Date Due  . TETANUS/TDAP Cheaper to receive at her pharmacy but you are due for this. 12/24/2017  . COVID-19 Vaccine (3 - Booster for [employer] series) Has not received her booster. Would encourage you to complete this 12/23/2019  . OPHTHALMOLOGY EXAM - Sign release of information at the check out desk for last eye exam  02/29/2020   Please stop by lab before you go If you have mychart- we will send your results within 3 business days of [name] receiving them.  If you do not have mychart- we will call you about results within 5 business days of [name] receiving them.  *please note we are currently using [facility] which has a longer processing time than [facility] typically so labs may not come back as quickly as in the past *please also note that you will see labs on mychart as soon as they post. I will later go in and write notes on them- will say "notes from Dr. Yong Channel"

## 2020-04-19 NOTE — Progress Notes (Signed)
Phone [phone] In person visit   Subjective:   [name] is a 79 y.o. year old very pleasant female patient who presents for/with See problem oriented charting Chief Complaint  Patient presents with  . Diabetes  . Hyperlipidemia  . Hypertension   This visit occurred during the SARS-CoV-2 public health emergency.  Safety protocols were in place, including screening questions prior to the visit, additional usage of staff PPE, and extensive cleaning of exam room while observing appropriate contact time as indicated for disinfecting solutions.   Past Medical History-  Patient Active Problem List   Diagnosis Date Noted  . 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    Priority: High  . Bronchiectasis ([place]) 10/18/2015    Priority: High  . Diabetes mellitus type II, controlled ([place]) 12/28/2006    Priority: High  . Adenomatous polyp 12/20/2018    Priority: Medium  . Personal history of colonic polyps 05/18/2017    Priority: Medium  . Aortic atherosclerosis ([place]) 02/16/2016    Priority: Medium  . Solitary pulmonary nodule 01/08/2016    Priority: Medium  . IBS (irritable bowel syndrome) 07/17/2014    Priority: Medium  . Recurrent major depression in partial remission ([place]) 01/03/2014    Priority: Medium  . Hypertension associated with diabetes ([place]) 01/03/2014    Priority: Medium  . Primary open-angle glaucoma 08/02/2012    Priority: Medium  . Fatty liver 03/18/2009    Priority: Medium  . Hyperlipidemia associated with type 2 diabetes mellitus ([place]) 07/26/2007    Priority: Medium  . ANEMIA, B12 DEFICIENCY 12/29/2006    Priority: Medium  . RESTLESS LEG SYNDROME, MILD 12/28/2006    Priority: Medium  . Osteoporosis 12/12/2006    Priority: Medium  . History of total knee replacement, bilateral 11/14/2017    Priority: Low  . Arthritis of midfoot 03/03/2016    Priority: Low  . Hammertoes of both feet 03/03/2016    Priority: Low  . Posterior tibial tendon dysfunction 01/03/2014     Priority: Low  . Osteoarthritis, knee 01/03/2014    Priority: Low  . Glaucoma 01/03/2014    Priority: Low  . Obesity (BMI 30-39.9) 06/15/2013    Priority: Low  . Foot tendinitis 07/20/2010    Priority: Low  . INSOMNIA, CHRONIC 07/25/2009    Priority: Low  . GERD 12/25/2007    Priority: Low  . NEUROPATHY, IDIOPATHIC PERIPHERAL NEC 12/28/2006    Priority: Low  . BREAST CANCER, HX OF 12/12/2006    Priority: Low  . 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 11/22/2019  . Healthcare maintenance 02/22/2019  . Physical deconditioning 02/22/2019  . Shortness of breath 02/22/2019  . Pain due to onychomycosis of toenails of both feet 11/08/2018  . Diabetic neuropathy ([place]) 11/08/2018    Medications- reviewed and updated Current Outpatient Medications  Medication Sig Dispense Refill  . atenolol (TENORMIN) 25 MG tablet Take 1 tablet by mouth once daily 90 tablet 0  . Bacillus Coagulans-Inulin (ALIGN PREBIOTIC-PROBIOTIC PO) Take 1 capsule by mouth daily.     . blood glucose meter kit and supplies KIT Dispense based on patient and insurance preference. Use up to four times daily as directed. Dx E11.9 1 each 0  . cetirizine (ZYRTEC) 10 MG tablet Take 10 mg by mouth every morning.     . Cholecalciferol (VITAMIN D3) 125 MCG (5000 UT) TABS Take 5,000 Units by mouth daily.    . Coenzyme Q10 (COQ10) 200 MG CAPS Take 200 mg by mouth daily.    . colesevelam (  WELCHOL) 625 MG tablet TAKE 1 TABLET BY MOUTH IN THE MORNING, AT NOON,  AND AT BEDTIME 90 tablet 0  . dicyclomine (BENTYL) 20 MG tablet Take 1 tablet (20 mg total) by mouth in the morning, at noon, in the evening, and at bedtime. 120 tablet 5  . dorzolamide-timolol (COSOPT) 22.3-6.8 MG/ML ophthalmic solution Place 1 drop into both eyes 2 (two) times daily.     [name] ESTRING 2 MG vaginal ring INSERT 1 RING  INTO VAGINA EVERY 3 MONTHS (Patient taking differently: Place 2 mg vaginally every 3 (three) months.) 1 each 3  . FIBER PO Take 3  tablets by mouth every evening.    . fluconazole (DIFLUCAN) 150 MG tablet TAKE ONE TABLET BY MOUTH AS A ONE-TIME DOSE (Patient taking differently: Take 150 mg by mouth daily as needed (symptoms of yeast).) 1 tablet 0  . FLUoxetine (PROZAC) 40 MG capsule Take 1 capsule by mouth once daily (Patient taking differently: Take 40 mg by mouth daily.) 90 capsule 0  . fluticasone (FLONASE) 50 MCG/ACT nasal spray Place 2 sprays into both nostrils daily. 16 g 11  . gabapentin (NEURONTIN) 100 MG capsule Take 1 capsule (100 mg total) by mouth 3 (three) times daily as needed (neuropathic pain). 90 capsule 3  . glimepiride (AMARYL) 4 MG tablet TAKE 2 TABLETS BY MOUTH ONCE DAILY WITH BREAKFAST (Patient taking differently: Take 8 mg by mouth daily with breakfast.) 180 tablet 0  . glucose blood (ACCU-CHEK GUIDE) test strip Use to test blood sugars daily. Dx: E11.9 100 each 6  . glucose blood test strip Use to test up to twice a day 200 each 1  . Lancets Misc. (ACCU-CHEK FASTCLIX LANCET) KIT Use to test blood sugars daily. Dx: E11.9 1 kit 5  . latanoprost (XALATAN) 0.005 % ophthalmic solution Place 1 drop into the right eye at bedtime.     [name] lisinopril (ZESTRIL) 10 MG tablet Take 1 tablet by mouth once daily 90 tablet 0  . lovastatin (MEVACOR) 10 MG tablet Take 1 tablet (10 mg total) by mouth every other day. 45 tablet 3  . Misc Natural Products (NEURIVA PO) Take by mouth.    . Multiple Vitamin (MULTIVITAMIN WITH MINERALS) TABS tablet Take 1 tablet by mouth daily. Centrum Silver    . pramipexole (MIRAPEX) 0.5 MG tablet TAKE 1 TABLET BY MOUTH AT BEDTIME (Patient taking differently: Take 0.5 mg by mouth at bedtime.) 90 tablet 0  . Respiratory Therapy Supplies (FLUTTER) DEVI Use as directed 1 each 0  . ROCKLATAN 0.02-0.005 % SOLN Place 1 drop into the left eye at bedtime.     . Selenium 200 MCG CAPS Take 200 mcg by mouth at bedtime.     . SYMBICORT 160-4.5 MCG/ACT inhaler INHALE 2 PUFFS BY MOUTH TWICE DAILY . (Patient  taking differently: Inhale 2 puffs into the lungs in the morning and at bedtime.) 10.2 g 11  . temazepam (RESTORIL) 15 MG capsule TAKE 1 CAPSULE BY MOUTH AT BEDTIME AS NEEDED FOR SLEEP (Patient taking differently: Take 15 mg by mouth at bedtime.) 30 capsule 5  . VICTOZA 18 MG/3ML SOPN INJECT 1.8 MG SUBCUTANEOUSLY  ONCE DAILY 9 mL 0  . vitamin B-12 (CYANOCOBALAMIN) 1000 MCG tablet Take 1,000 mcg by mouth daily.    . famotidine (PEPCID) 20 MG tablet Take 1 tablet (20 mg total) by mouth daily as needed for heartburn or indigestion. (Patient not taking: No sig reported)    . UNABLE TO FIND Fiber therapy (Patient  not taking: No sig reported)     No current facility-administered medications for this visit.     Objective:  BP 132/70   Pulse 80   Temp (!) 97.3 F (36.3 C) (Temporal)   Resp 18   Ht 5\' 10"  (1.778 m)   LMP 05/10/1992 Comment: partial  SpO2 96%   BMI 35.94 kg/m  Gen: NAD, resting comfortably CV: RRR no murmurs rubs or gallops Lungs: CTAB no crackles, wheeze, rhonchi Abdomen: soft/nontender/nondistended/normal bowel sounds. No rebound or guarding. y Ext: no ed[place]ada Skin: warm, dry Neuro: wheelchair to    Assessment and Plan   #anal fistula - recent surgery with 2nd one recently with Dr. Johney Maine. Has another visit in January. Still getting some bleeding from this.   # Diabetes S: Medication: victoza 1.8mg  daily and glimepiride 8 mg - yeast issue concern on sglt2 inhibitor and GI effects on metformin. Wants to avoid insulin.  CBGs-  Variable from 120 to 180 Exercise and diet- have encouraged food pedaling- she has had some rectum discomfort- see above Have discussed PT as option to help with mobility.  -reports 245 on home scale today- does not want to reweight - has been down over many deaths of friends and has not been very mobile.  Lab Results  Component Value Date   HGBA1C 7.5 (H) 01/08/2020   HGBA1C 7.6 (H) 09/24/2019   HGBA1C 7.8 (H) 06/18/2019   A/P: hoping for  a1c at least below 8- treatment options limited- likely continue current meds unless above 8- continue to push for lifestyle changes  #hypertension S: medication:  atenolol 25 mg daily and lisinopril 10mg  BP Readings from Last 3 Encounters:  04/21/20 132/70  03/07/20 101/73  02/28/20 (!) 151/52  A/P: reasonable control- continue current medicines  #hyperlipidemia S: Medication: lovastatin every other day up from weekly- may also be on welchol from GI Lab Results  Component Value Date   CHOL 159 01/08/2020   HDL 52 01/08/2020   LDLCALC 77 01/08/2020   LDLDIRECT 77.0 02/16/2016   TRIG 199 (H) 01/08/2020   CHOLHDL 3.1 01/08/2020   A/P: LDL close to ideal goal 70 or less- will continue current meds  # Depression S: Medication:  fluoxetine 40 mg Depression screen [facility] 2/9 04/21/2020 01/08/2020 09/24/2019  Decreased Interest 2 3 3   Down, Depressed, Hopeless 2 3 3   PHQ - 2 Score 4 6 6   Altered sleeping 0 0 2  Tired, decreased energy 3 3 3   Change in appetite 0 3 0  Feeling bad or failure about yourself  0 0 0  Trouble concentrating 0 0 0  Moving slowly or fidgety/restless 0 0 0  Suicidal thoughts 0 0 0  PHQ-9 Score 7 12 11   Difficult doing work/chores Somewhat difficult Not difficult at all Somewhat difficult  Some recent data might be hidden  A/P: partial remission again today. Declines counseling (not helpful in past- has seen Lattie Haw I believe) and medication change  Recommended follow up: No follow-ups on file. Future Appointments  Date Time Provider [place]  05/23/2020 10:40 AM Nandigam, Venia Minks, MD [facility]   Lab/Order associations:   ICD-10-CM   1. Controlled type 2 diabetes mellitus without complication, without long-term current use of insulin (HCC)  M25.0 COMPLETE METABOLIC PANEL WITH GFR    Hemoglobin A1c  2. Hypertension associated with diabetes ([place])  I37.04 COMPLETE METABOLIC PANEL WITH GFR   I15.2   3. Hyperlipidemia associated with type 2  diabetes mellitus (HCC)  E11.69  COMPLETE METABOLIC PANEL WITH GFR   E78.5    Return precautions advised.  [name], MD

## 2020-04-21 ENCOUNTER — Ambulatory Visit (INDEPENDENT_AMBULATORY_CARE_PROVIDER_SITE_OTHER): Payer: Medicare PPO | Admitting: Family Medicine

## 2020-04-21 ENCOUNTER — Other Ambulatory Visit: Payer: Self-pay

## 2020-04-21 ENCOUNTER — Encounter: Payer: Self-pay | Admitting: Family Medicine

## 2020-04-21 VITALS — BP 132/70 | HR 80 | Temp 97.3°F | Resp 18 | Ht 70.0 in

## 2020-04-21 DIAGNOSIS — E785 Hyperlipidemia, unspecified: Secondary | ICD-10-CM | POA: Diagnosis not present

## 2020-04-21 DIAGNOSIS — I152 Hypertension secondary to endocrine disorders: Secondary | ICD-10-CM

## 2020-04-21 DIAGNOSIS — E1169 Type 2 diabetes mellitus with other specified complication: Secondary | ICD-10-CM | POA: Diagnosis not present

## 2020-04-21 DIAGNOSIS — E119 Type 2 diabetes mellitus without complications: Secondary | ICD-10-CM | POA: Diagnosis not present

## 2020-04-21 DIAGNOSIS — E1159 Type 2 diabetes mellitus with other circulatory complications: Secondary | ICD-10-CM

## 2020-04-22 LAB — COMPLETE METABOLIC PANEL WITH GFR
AG Ratio: 1.4 (calc) (ref 1.0–2.5)
ALT: 24 U/L (ref 6–29)
AST: 21 U/L (ref 10–35)
Albumin: 3.7 g/dL (ref 3.6–5.1)
Alkaline phosphatase (APISO): 74 U/L (ref 37–153)
BUN: 13 mg/dL (ref 7–25)
CO2: 32 mmol/L (ref 20–32)
Calcium: 9.3 mg/dL (ref 8.6–10.4)
Chloride: 102 mmol/L (ref 98–110)
Creat: 0.65 mg/dL (ref 0.60–0.88)
GFR, Est African American: 97 mL/min/{1.73_m2} (ref >=60)
GFR, Est Non African American: 84 mL/min/{1.73_m2} (ref >=60)
Globulin: 2.7 g/dL (calc) (ref 1.9–3.7)
Glucose, Bld: 138 mg/dL — ABNORMAL HIGH (ref 65–99)
Potassium: 4.8 mmol/L (ref 3.5–5.3)
Sodium: 141 mmol/L (ref 135–146)
Total Bilirubin: 0.5 mg/dL (ref 0.2–1.2)
Total Protein: 6.4 g/dL (ref 6.1–8.1)

## 2020-04-22 LAB — HEMOGLOBIN A1C
Hgb A1c MFr Bld: 7.4 % of total Hgb — ABNORMAL HIGH (ref <5.7)
Mean Plasma Glucose: 166 mg/dL
eAG (mmol/L): 9.2 mmol/L

## 2020-04-24 ENCOUNTER — Other Ambulatory Visit: Payer: Self-pay | Admitting: Family Medicine

## 2020-04-25 ENCOUNTER — Other Ambulatory Visit: Payer: Self-pay | Admitting: Physician Assistant

## 2020-05-01 ENCOUNTER — Other Ambulatory Visit: Payer: Self-pay | Admitting: Family Medicine

## 2020-05-05 ENCOUNTER — Telehealth: Payer: Self-pay

## 2020-05-05 ENCOUNTER — Other Ambulatory Visit (INDEPENDENT_AMBULATORY_CARE_PROVIDER_SITE_OTHER): Payer: Medicare PPO

## 2020-05-05 ENCOUNTER — Other Ambulatory Visit: Payer: Self-pay

## 2020-05-05 DIAGNOSIS — R3915 Urgency of urination: Secondary | ICD-10-CM

## 2020-05-05 LAB — POCT URINALYSIS DIPSTICK
Bilirubin, UA: NEGATIVE
Blood, UA: NEGATIVE
Glucose, UA: NEGATIVE
Ketones, UA: NEGATIVE
Leukocytes, UA: NEGATIVE
Nitrite, UA: POSITIVE
Protein, UA: POSITIVE — AB
Spec Grav, UA: >=1.03 — AB (ref 1.010–1.025)
Urobilinogen, UA: 0.2 E.U./dL
pH, UA: 6 (ref 5.0–8.0)

## 2020-05-05 NOTE — Telephone Encounter (Signed)
Orders have been placed.

## 2020-05-05 NOTE — Telephone Encounter (Signed)
Patient states she is expiericing another UTI she states she has seen dr hunter multiple times recently regarding this. Can we put orders in to do a urinalysis her husband is coming to pick up urine cup. I explained to her she may need appt or we will need orders put in to do this.

## 2020-05-05 NOTE — Telephone Encounter (Signed)
Patients husband picked up urine cup

## 2020-05-06 ENCOUNTER — Telehealth: Payer: Self-pay | Admitting: Internal Medicine

## 2020-05-06 ENCOUNTER — Other Ambulatory Visit: Payer: Self-pay | Admitting: *Deleted

## 2020-05-06 LAB — URINE CULTURE
MICRO NUMBER:: 11358253
SPECIMEN QUALITY:: ADEQUATE

## 2020-05-06 MED ORDER — SULFAMETHOXAZOLE-TRIMETHOPRIM 800-160 MG PO TABS: 1.0000 | ORAL_TABLET | Freq: Two times a day (BID) | ORAL | 0 refills | Status: DC

## 2020-05-06 MED ORDER — SULFAMETHOXAZOLE-TRIMETHOPRIM 800-160 MG PO TABS: 1.0000 | ORAL_TABLET | Freq: Two times a day (BID) | ORAL | 0 refills | Status: AC

## 2020-05-06 NOTE — Telephone Encounter (Signed)
On call nurse called.  Ms Foote was seen for UTI.  States medication was to be called in.  Reviewed chart. Informed Bactrim was sent in today.  She will go to pharmacy to pick up.

## 2020-05-06 NOTE — Telephone Encounter (Signed)
Patient wants to know which antibiotic she should be placed on. Please advise

## 2020-05-06 NOTE — Telephone Encounter (Signed)
Looks like Dr Durene Cal has place her on bactrim before. Please send in bactrim DS tablet take twice daily for 3 days.  Katina Degree. Jimmey Ralph, MD 05/06/2020 4:30 PM

## 2020-05-06 NOTE — Telephone Encounter (Signed)
Rx send to pharmacy  

## 2020-05-06 NOTE — Telephone Encounter (Signed)
Pt received results on mychart. She is asking if she can now have an antibiotic for abnormal results.

## 2020-05-09 ENCOUNTER — Encounter: Payer: Self-pay | Admitting: Family Medicine

## 2020-05-12 ENCOUNTER — Other Ambulatory Visit: Payer: Self-pay

## 2020-05-12 DIAGNOSIS — R300 Dysuria: Secondary | ICD-10-CM

## 2020-05-12 DIAGNOSIS — R3915 Urgency of urination: Secondary | ICD-10-CM

## 2020-05-13 ENCOUNTER — Other Ambulatory Visit: Payer: Medicare PPO

## 2020-05-13 ENCOUNTER — Other Ambulatory Visit: Payer: Self-pay

## 2020-05-13 DIAGNOSIS — R300 Dysuria: Secondary | ICD-10-CM

## 2020-05-13 DIAGNOSIS — R3915 Urgency of urination: Secondary | ICD-10-CM | POA: Diagnosis not present

## 2020-05-13 NOTE — Addendum Note (Signed)
Addended by: Cleda Mccreedy F on: 05/13/2020 03:38 PM   Modules accepted: Orders

## 2020-05-14 ENCOUNTER — Other Ambulatory Visit: Payer: Self-pay | Admitting: Family Medicine

## 2020-05-15 ENCOUNTER — Other Ambulatory Visit: Payer: Self-pay | Admitting: Family Medicine

## 2020-05-15 LAB — URINE CULTURE
MICRO NUMBER:: 11380219
SPECIMEN QUALITY:: ADEQUATE

## 2020-05-15 MED ORDER — AMOXICILLIN-POT CLAVULANATE 875-125 MG PO TABS: 1.0000 | ORAL_TABLET | Freq: Two times a day (BID) | ORAL | 0 refills | Status: AC

## 2020-05-16 ENCOUNTER — Other Ambulatory Visit: Payer: Self-pay | Admitting: Family Medicine

## 2020-05-21 ENCOUNTER — Encounter: Payer: Self-pay | Admitting: Family Medicine

## 2020-05-22 ENCOUNTER — Other Ambulatory Visit: Payer: Self-pay | Admitting: Family Medicine

## 2020-05-22 DIAGNOSIS — L821 Other seborrheic keratosis: Secondary | ICD-10-CM | POA: Diagnosis not present

## 2020-05-22 DIAGNOSIS — D485 Neoplasm of uncertain behavior of skin: Secondary | ICD-10-CM | POA: Diagnosis not present

## 2020-05-22 DIAGNOSIS — L814 Other melanin hyperpigmentation: Secondary | ICD-10-CM | POA: Diagnosis not present

## 2020-05-22 DIAGNOSIS — B078 Other viral warts: Secondary | ICD-10-CM | POA: Diagnosis not present

## 2020-05-22 DIAGNOSIS — D225 Melanocytic nevi of trunk: Secondary | ICD-10-CM | POA: Diagnosis not present

## 2020-05-22 MED ORDER — FLUCONAZOLE 150 MG PO TABS: ORAL_TABLET | ORAL | 0 refills | Status: DC

## 2020-05-23 ENCOUNTER — Ambulatory Visit: Payer: Medicare PPO | Admitting: Gastroenterology

## 2020-05-23 ENCOUNTER — Encounter: Payer: Self-pay | Admitting: Gastroenterology

## 2020-05-23 VITALS — BP 140/68 | HR 80 | Ht 71.0 in

## 2020-05-23 DIAGNOSIS — K9089 Other intestinal malabsorption: Secondary | ICD-10-CM | POA: Diagnosis not present

## 2020-05-23 DIAGNOSIS — K603 Anal fistula: Secondary | ICD-10-CM | POA: Diagnosis not present

## 2020-05-23 DIAGNOSIS — K580 Irritable bowel syndrome with diarrhea: Secondary | ICD-10-CM | POA: Diagnosis not present

## 2020-05-23 MED ORDER — COLESEVELAM HCL 625 MG PO TABS: ORAL_TABLET | ORAL | 3 refills | Status: DC

## 2020-05-23 MED ORDER — DICYCLOMINE HCL 20 MG PO TABS
20.0000 mg | ORAL_TABLET | Freq: Four times a day (QID) | ORAL | 5 refills | Status: DC
Start: 2020-05-23 — End: 2021-06-09

## 2020-05-23 NOTE — Patient Instructions (Signed)
We have sent refills of Welchol and Dicyclomine to your pharmacy  Follow up in 1 year  Due to recent changes in healthcare laws, you may see the results of your imaging and laboratory studies on MyChart before your provider has had a chance to review them.  We understand that in some cases there may be results that are confusing or concerning to you. Not all laboratory results come back in the same time frame and the provider may be waiting for multiple results in order to interpret others.  Please give [name] 48 hours in order for your provider to thoroughly review all the results before contacting the office for clarification of your results.   If you are age 88 or older, your body mass index should be between 23-30. Your Body mass index is 34.94 kg/m. If this is out of the aforementioned range listed, please consider follow up with your Primary Care Provider.  If you are age 70 or younger, your body mass index should be between 19-25. Your Body mass index is 34.94 kg/m. If this is out of the aformentioned range listed, please consider follow up with your Primary Care Provider.    I appreciate the  opportunity to care for you  Thank You   Harl Bowie , MD

## 2020-05-23 NOTE — Progress Notes (Signed)
[name]    [id number]    1940-08-19  Primary Care Physician:Hunter, Brayton Mars, MD  Referring Physician: Marin Olp, [place],  [place] [address]   Chief complaint: IBS-diarrhea HPI:  80 year old very pleasant female with history of obesity, type 2 diabetes, hypertension, irritable bowel syndrome predominant diarrhea here for follow-up visit  Overall she is doing much better  She is having 1-2 soft bowel movements daily with WelChol and is using dicyclomine as needed for abdominal discomfort and cramping and IBS symptoms.  Last office visit in October 2021, was seen by Anderson Malta and prior to that was seen by Amy in March 2021, was treated with antibiotics for acute diverticulitis and perianal abscess/ fistula  She is s/p fistulotomy with marsupialization by Dr. Johney Maine March 07, 2020  Denies any nausea, vomiting, abdominal pain, melena or bright red blood per rectum   Colonoscopy December 19, 2018: 4 sessile polyps [tubular adenomas] removed, 2 to 7 mm in size, sigmoid and descending colon diverticulosis and internal hemorrhoids.   Outpatient Encounter Medications as of 05/23/2020  Medication Sig  . ACCU-CHEK GUIDE test strip USE 1 TO TEST UP TO TWICE DAILY  . atenolol (TENORMIN) 25 MG tablet Take 1 tablet by mouth once daily  . Bacillus Coagulans-Inulin (ALIGN PREBIOTIC-PROBIOTIC PO) Take 1 capsule by mouth daily.   . blood glucose meter kit and supplies KIT Dispense based on patient and insurance preference. Use up to four times daily as directed. Dx E11.9  . cetirizine (ZYRTEC) 10 MG tablet Take 10 mg by mouth every morning.   . Cholecalciferol (VITAMIN D3) 125 MCG (5000 UT) TABS Take 5,000 Units by mouth daily.  . Coenzyme Q10 (COQ10) 200 MG CAPS Take 200 mg by mouth daily.  . colesevelam (WELCHOL) 625 MG tablet TAKE 1 TABLET BY MOUTH ONCE DAILY IN THE MORNING AND AT NOON AND AT BEDTIME  . dicyclomine (BENTYL) 20 MG tablet Take 1  tablet (20 mg total) by mouth in the morning, at noon, in the evening, and at bedtime.  . dorzolamide-timolol (COSOPT) 22.3-6.8 MG/ML ophthalmic solution Place 1 drop into both eyes 2 (two) times daily.   [name] ESTRING 2 MG vaginal ring INSERT 1 RING  INTO VAGINA EVERY 3 MONTHS (Patient taking differently: Place 2 mg vaginally every 3 (three) months.)  . famotidine (PEPCID) 20 MG tablet Take 1 tablet (20 mg total) by mouth daily as needed for heartburn or indigestion.  [name] FIBER PO Take 3 tablets by mouth every evening.  . fluconazole (DIFLUCAN) 150 MG tablet TAKE ONE TABLET BY MOUTH AS A ONE-TIME DOSE  . FLUoxetine (PROZAC) 40 MG capsule Take 1 capsule by mouth once daily  . fluticasone (FLONASE) 50 MCG/ACT nasal spray Place 2 sprays into both nostrils daily.  [name] gabapentin (NEURONTIN) 100 MG capsule Take 1 capsule (100 mg total) by mouth 3 (three) times daily as needed (neuropathic pain).  [name] glimepiride (AMARYL) 4 MG tablet TAKE 2 TABLETS BY MOUTH ONCE DAILY WITH BREAKFAST (Patient taking differently: Take 8 mg by mouth daily with breakfast.)  . glucose blood (ACCU-CHEK GUIDE) test strip Use to test blood sugars daily. Dx: E11.9  . Lancets Misc. (ACCU-CHEK FASTCLIX LANCET) KIT Use to test blood sugars daily. Dx: E11.9  . latanoprost (XALATAN) 0.005 % ophthalmic solution Place 1 drop into the right eye at bedtime.   [name] lisinopril (ZESTRIL) 10 MG tablet Take 1 tablet by mouth once daily  .  lovastatin (MEVACOR) 10 MG tablet Take 1 tablet (10 mg total) by mouth every other day.  . Misc Natural Products (NEURIVA PO) Take by mouth.  . Multiple Vitamin (MULTIVITAMIN WITH MINERALS) TABS tablet Take 1 tablet by mouth daily. Centrum Silver  . pramipexole (MIRAPEX) 0.5 MG tablet TAKE 1 TABLET BY MOUTH AT BEDTIME  . Respiratory Therapy Supplies (FLUTTER) DEVI Use as directed  . ROCKLATAN 0.02-0.005 % SOLN Place 1 drop into the left eye at bedtime.   . Selenium 200 MCG CAPS Take 200 mcg by mouth at bedtime.   .  SYMBICORT 160-4.5 MCG/ACT inhaler INHALE 2 PUFFS BY MOUTH TWICE DAILY . (Patient taking differently: Inhale 2 puffs into the lungs in the morning and at bedtime.)  . temazepam (RESTORIL) 15 MG capsule TAKE 1 CAPSULE BY MOUTH AT BEDTIME AS NEEDED FOR SLEEP (Patient taking differently: Take 15 mg by mouth at bedtime.)  . UNABLE TO FIND Fiber therapy  . VICTOZA 18 MG/3ML SOPN INJECT 1.8 MG SUBCUTANEOUSLY  ONCE DAILY  . vitamin B-12 (CYANOCOBALAMIN) 1000 MCG tablet Take 1,000 mcg by mouth daily.   No facility-administered encounter medications on file as of 05/23/2020.    Allergies as of 05/23/2020 - Review Complete 05/23/2020  Allergen Reaction Noted  . Nitrofurantoin Other (See Comments) 10/14/2006  . Levaquin [levofloxacin in d5w] Other (See Comments) 12/18/2014  . Brimonidine Other (See Comments) 08/02/2012  . Cephalexin Diarrhea and Other (See Comments) 10/14/2006  . Erythromycin ethylsuccinate Hives and Diarrhea 10/14/2006  . Oxycodone Itching 09/11/2019    Past Medical History:  Diagnosis Date  . Allergy   . Anemia   . Anxiety   . Arthritis    right knee;injections every 61months   . Asthma    use daily  . Breast cancer ([place]) 1994/1995   HX BREAST CANCER/ right brreast and left breast in 1995  . Bronchiectasis ([place])   . COMPRESSION FRACTURE, LUMBAR VERTEBRAE 08/21/2008   Qualifier: Diagnosis of  By: Arnoldo Morale MD, Balinda Quails   . Depression   . Diabetes mellitus    takes Amaryl and Januvia daily  . Difficulty sleeping   . Diverticulitis   . Dyspnea    with activity  . Early cataracts, bilateral   . Eczema   . Endometriosis   . Fatty liver   . Gastritis   . Glaucoma   . Hammer toe   . History of kidney stones   . Hyperlipidemia associated with type 2 diabetes mellitus ([place]) 07/26/2007   Diet/exercise control.    . Hypertension    takes Atenolol daily  . IBS (irritable bowel syndrome)   . Joint pain   . Neuropathy    BILATERAL FEET AND MID CALF  . Neuropathy due to  medical condition (HCC)    bi lat legs/feet  . Open wound of second toe of left foot in past   at pre-op appt, wound appears clear of infection 1 month post removal of toenail, no obvious exudate present nor any redness,  . Osteomyelitis ([place])    left 2nd toe  . Osteopenia   . Perirectal fistula   . Posterior tibial tendon dysfunction    left foot  . Restless leg syndrome   . Scoliosis   . Severe esophageal dysplasia   . 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 Thyroid disease   . Vitamin D deficiency    takes Vit d daily  . Weakness    uses a  walker and wheelchair    Past Surgical History:  Procedure Laterality Date  . ADENOIDECTOMY     at age 31  . ANAL FISTULOTOMY N/A 03/07/2020   Procedure: ANAL FISTULOTOMY WITH MARSUPIALIZATION;  Surgeon: Michael Boston, MD;  Location: [facility];  Service: General;  Laterality: N/A;  . BIOPSY  12/19/2018   Procedure: BIOPSY;  Surgeon: Mauri Pole, MD;  Location: [facility];  Service: Endoscopy;;  . CATARACT EXTRACTION    . CHOLECYSTECTOMY  06/2008  . COLONOSCOPY WITH PROPOFOL N/A 05/17/2017   Procedure: COLONOSCOPY WITH PROPOFOL;  Surgeon: Mauri Pole, MD;  Location: [facility];  Service: Endoscopy;  Laterality: N/A;  PT WILL BE ADMITTED THE DAY BEFORE FOR PREP PER ROBIN KB  . COLONOSCOPY WITH PROPOFOL N/A 12/19/2018   Procedure: COLONOSCOPY WITH PROPOFOL;  Surgeon: Mauri Pole, MD;  Location: [facility];  Service: Endoscopy;  Laterality: N/A;  . DILATION AND CURETTAGE OF UTERUS    . excision on breast     internal infected suture from breast surgery  . excision removed from neck     infected lymph node  . FOOT SURGERY     left foot-cleaning out areas on toes at the wound center-having MRI to determine if osteomyelitis  . HEMORRHOID SURGERY N/A 11/22/2019   Procedure: HEMORRHOIDECTOMY LIGATION , PEXY;  Surgeon: Michael Boston, MD;  Location: [facility];  Service: General;  Laterality: N/A;  . HYPERBARIC OXYGEN THERAPY     FEET INFECTION  . HYSTEROSCOPY  06/22/11   PMB submucosal myoma  . JOINT REPLACEMENT     left total shoulder  . LAPAROSCOPIC OOPHORECTOMY Right 12/2004   absent LSO  . LAPAROTOMY    . LIGATION OF INTERNAL FISTULA TRACT N/A 11/22/2019   Procedure: REPAIR OF PERIRECTAL FISTULA, ANORECTAL EXAMINATION UNDER ANESTHESIA;  Surgeon: Michael Boston, MD;  Location: [place] [facility];  Service: General;  Laterality: N/A;  . MASTECTOMY Bilateral 1994 right, 1995 left  . POLYPECTOMY  12/19/2018   Procedure: POLYPECTOMY;  Surgeon: Mauri Pole, MD;  Location: [facility];  Service: Endoscopy;;  . RECTAL EXAM UNDER ANESTHESIA N/A 03/07/2020   Procedure: ANORECTAL EXAM UNDER ANESTHESIA;  Surgeon: Michael Boston, MD;  Location: [facility];  Service: General;  Laterality: N/A;  . SHOULDER HEMI-ARTHROPLASTY  01/01/2012   Procedure: SHOULDER HEMI-ARTHROPLASTY;  Surgeon: Roseanne Kaufman, MD;  Location: [place];  Service: Orthopedics;  Laterality: Left;  Left Shoulder Hemi Arthroplasty with Repair and Reconstruction as Necessary   . THYROIDECTOMY     73yr ago. follows endocrine  . TONSILLECTOMY    . TOTAL KNEE ARTHROPLASTY Right 12/30/2014   Procedure: RIGHT TOTAL KNEE ARTHROPLASTY;  Surgeon: FGaynelle Arabian MD;  Location: [facility];  Service: Orthopedics;  Laterality: Right;  . TOTAL KNEE ARTHROPLASTY Left 11/29/2016   Procedure: LEFT TOTAL KNEE ARTHROPLASTY;  Surgeon: AGaynelle Arabian MD;  Location: [facility];  Service: Orthopedics;  Laterality: Left;    Family History  Problem Relation Age of Onset  . Arthritis Mother   . COPD Father   . Heart disease Father        MI 893- states he died of lockjaw  . Hypertension Father   . Hyperlipidemia Father   . Pancreatic cancer Paternal Grandfather   . Colon cancer Neg Hx   . Esophageal cancer Neg Hx   . Rectal cancer Neg Hx   . Stomach cancer Neg Hx     Social  History  Socioeconomic History  . Marital status: Married    Spouse name: Not on file  . Number of children: 1  . Years of education: Not on file  . Highest education level: Not on file  Occupational History  . Occupation: retired  Tobacco Use  . Smoking status: Never Smoker  . Smokeless tobacco: Never Used  Vaping Use  . Vaping Use: Never used  Substance and Sexual Activity  . Alcohol use: No  . Drug use: No  . Sexual activity: Not Currently    Partners: Male    Birth control/protection: Post-menopausal, Surgical    Comment: partial hysterectomy  Other Topics Concern  . Not on file  Social History Narrative   Married 39 years in 2015. 1 adopted son. 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   Physical Exam: Vitals:   05/23/20 1047  BP: 140/68  Pulse: 80   Body mass index is 34.94 kg/m. Gen:      No acute distress, in wheelchair Neuro: alert and oriented x 3 Psych: normal mood and affect  Data Reviewed:  Reviewed labs, radiology imaging, old records and pertinent past GI work up   Assessment and Plan/Recommendations:  80 year old very pleasant female with IBS diarrhea, bile salt induced diarrhea here for follow-up visit Overall symptoms stable  IBS-D and bile salt induced diarrhea: Continue WelChol, titrate dose based on symptoms to have 1-2 bowel movements daily  History of perianal abscess and fistula status post fistulotomy, is healing, is followed/managed by Dr. Johney Maine at [facility]  IBS with abdominal cramping and discomfort:  Use dicyclomine 20 mg every 8 hours as needed  History of adenomatous colon polyps, multiple polyps removed in 2020, no recall colonoscopy due to age  Return in 1 year or sooner if needed  This visit required 30 minutes of patient care (this includes precharting, chart review, review of results, face-to-face time used for counseling as well as treatment plan and follow-up. The patient was provided an opportunity to ask questions and all were answered. The patient agreed with the plan and demonstrated an understanding of the instructions.  Damaris Hippo , MD    CC: Marin Olp, MD

## 2020-05-27 ENCOUNTER — Other Ambulatory Visit: Payer: Self-pay | Admitting: Family Medicine

## 2020-05-30 ENCOUNTER — Other Ambulatory Visit: Payer: Self-pay | Admitting: Family Medicine

## 2020-06-12 ENCOUNTER — Other Ambulatory Visit: Payer: Self-pay | Admitting: Family Medicine

## 2020-06-20 ENCOUNTER — Other Ambulatory Visit: Payer: Self-pay | Admitting: Family Medicine

## 2020-06-23 ENCOUNTER — Encounter: Payer: Self-pay | Admitting: Family Medicine

## 2020-06-23 ENCOUNTER — Telehealth: Payer: Self-pay

## 2020-06-23 ENCOUNTER — Telehealth (INDEPENDENT_AMBULATORY_CARE_PROVIDER_SITE_OTHER): Payer: Medicare PPO | Admitting: Family Medicine

## 2020-06-23 VITALS — Ht 72.0 in | Wt 245.0 lb

## 2020-06-23 DIAGNOSIS — N390 Urinary tract infection, site not specified: Secondary | ICD-10-CM

## 2020-06-23 DIAGNOSIS — R300 Dysuria: Secondary | ICD-10-CM

## 2020-06-23 MED ORDER — AMOXICILLIN-POT CLAVULANATE 875-125 MG PO TABS: 1.0000 | ORAL_TABLET | Freq: Two times a day (BID) | ORAL | 0 refills | Status: DC

## 2020-06-23 MED ORDER — FLUCONAZOLE 150 MG PO TABS
ORAL_TABLET | ORAL | 0 refills | Status: DC
Start: 2020-06-23 — End: 2020-08-05

## 2020-06-23 NOTE — Patient Instructions (Signed)
Health Maintenance Due  Topic Date Due  . TETANUS/TDAP  12/24/2017  . COVID-19 Vaccine (3 - Booster for Pfizer series) 12/23/2019  . OPHTHALMOLOGY EXAM  02/29/2020  . FOOT EXAM  06/17/2020    Depression screen [facility] 2/9 04/21/2020 01/08/2020 09/24/2019  Decreased Interest 2 3 3   Down, Depressed, Hopeless 2 3 3   PHQ - 2 Score 4 6 6   Altered sleeping 0 0 2  Tired, decreased energy 3 3 3   Change in appetite 0 3 0  Feeling bad or failure about yourself  0 0 0  Trouble concentrating 0 0 0  Moving slowly or fidgety/restless 0 0 0  Suicidal thoughts 0 0 0  PHQ-9 Score 7 12 11   Difficult doing work/chores Somewhat difficult Not difficult at all Somewhat difficult  Some recent data might be hidden    Recommended follow up: No follow-ups on file.

## 2020-06-23 NOTE — Telephone Encounter (Signed)
Can we get her on virtual at 19 40? Im ok with UA and culture being done- would be awesome if can get done before visit but I know may be a stretch

## 2020-06-23 NOTE — Telephone Encounter (Signed)
Patient is scheduled for 11:40 virtual.

## 2020-06-23 NOTE — Telephone Encounter (Signed)
Orders placed.

## 2020-06-23 NOTE — Telephone Encounter (Signed)
Pt believes she has a UTI. She is requesting orders to be put in for a urine test. She wants her husband to come pick up a specimen cup

## 2020-06-23 NOTE — Progress Notes (Signed)
Phone [phone] Virtual visit via Video note   Subjective:  Chief complaint: Chief Complaint  Patient presents with  . Pressure when urinating     Patient states that it is pressure in her vagina lower region right where she urinates  . Urinary Frequency    And urgency    This visit type was conducted due to national recommendations for restrictions regarding the COVID-19 Pandemic (e.g. social distancing).  This format is felt to be most appropriate for this patient at this time balancing risks to patient and risks to population by having him in for in person visit.  No physical exam was performed (except for noted visual exam or audio findings with Telehealth visits).    Our team/I connected with [name] at 11:40 AM EST by a video enabled telemedicine application ([email or web address] or caregility through epic) and verified that I am speaking with the correct person using two identifiers.  Location patient: [facility] Location provider: [facility], office Persons participating in the virtual visit:  patient  Our team/I discussed the limitations of evaluation and management by telemedicine and the availability of in person appointments. In light of current covid-19 pandemic, patient also understands that we are trying to protect them by minimizing in office contact if at all possible.  The patient expressed consent for telemedicine visit and agreed to proceed. Patient understands insurance will be billed.   Past Medical History-  Patient Active Problem List   Diagnosis Date Noted  . 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    Priority: High  . Bronchiectasis ([place]) 10/18/2015    Priority: High  . Diabetes mellitus type II, controlled ([place]) 12/28/2006    Priority: High  . Adenomatous polyp 12/20/2018    Priority: Medium  . Personal history of colonic polyps 05/18/2017    Priority: Medium  . Aortic atherosclerosis ([place]) 02/16/2016    Priority: Medium  . Solitary pulmonary nodule 01/08/2016     Priority: Medium  . IBS (irritable bowel syndrome) 07/17/2014    Priority: Medium  . Recurrent major depression in partial remission ([place]) 01/03/2014    Priority: Medium  . Hypertension associated with diabetes ([place]) 01/03/2014    Priority: Medium  . Primary open-angle glaucoma 08/02/2012    Priority: Medium  . Fatty liver 03/18/2009    Priority: Medium  . Hyperlipidemia associated with type 2 diabetes mellitus ([place]) 07/26/2007    Priority: Medium  . ANEMIA, B12 DEFICIENCY 12/29/2006    Priority: Medium  . RESTLESS LEG SYNDROME, MILD 12/28/2006    Priority: Medium  . Osteoporosis 12/12/2006    Priority: Medium  . History of total knee replacement, bilateral 11/14/2017    Priority: Low  . Arthritis of midfoot 03/03/2016    Priority: Low  . Hammertoes of both feet 03/03/2016    Priority: Low  . Posterior tibial tendon dysfunction 01/03/2014    Priority: Low  . Osteoarthritis, knee 01/03/2014    Priority: Low  . Glaucoma 01/03/2014    Priority: Low  . Obesity (BMI 30-39.9) 06/15/2013    Priority: Low  . Foot tendinitis 07/20/2010    Priority: Low  . INSOMNIA, CHRONIC 07/25/2009    Priority: Low  . GERD 12/25/2007    Priority: Low  . NEUROPATHY, IDIOPATHIC PERIPHERAL NEC 12/28/2006    Priority: Low  . BREAST CANCER, HX OF 12/12/2006    Priority: Low  . 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 11/22/2019  . Healthcare maintenance 02/22/2019  . Physical deconditioning 02/22/2019  . Shortness of breath 02/22/2019  .  Pain due to onychomycosis of toenails of both feet 11/08/2018  . Diabetic neuropathy ([place]) 11/08/2018    Medications- reviewed and updated Current Outpatient Medications  Medication Sig Dispense Refill  . ACCU-CHEK GUIDE test strip USE 1 TO TEST UP TO TWICE DAILY 200 each 0  . amoxicillin-clavulanate (AUGMENTIN) 875-125 MG tablet Take 1 tablet by mouth 2 (two) times daily. 28 tablet 0  . atenolol (TENORMIN) 25 MG tablet Take 1 tablet by  mouth once daily 90 tablet 0  . Bacillus Coagulans-Inulin (ALIGN PREBIOTIC-PROBIOTIC PO) Take 1 capsule by mouth daily.     . blood glucose meter kit and supplies KIT Dispense based on patient and insurance preference. Use up to four times daily as directed. Dx E11.9 1 each 0  . cetirizine (ZYRTEC) 10 MG tablet Take 10 mg by mouth every morning.     . Cholecalciferol (VITAMIN D3) 125 MCG (5000 UT) TABS Take 5,000 Units by mouth daily.    . Coenzyme Q10 (COQ10) 200 MG CAPS Take 200 mg by mouth daily.    . colesevelam (WELCHOL) 625 MG tablet TAKE 1 TABLET BY MOUTH ONCE DAILY IN THE MORNING AND AT NOON AND AT BEDTIME 90 tablet 3  . dicyclomine (BENTYL) 20 MG tablet Take 1 tablet (20 mg total) by mouth in the morning, at noon, in the evening, and at bedtime. 120 tablet 5  . dorzolamide-timolol (COSOPT) 22.3-6.8 MG/ML ophthalmic solution Place 1 drop into both eyes 2 (two) times daily.     [name] ESTRING 2 MG vaginal ring INSERT 1 RING  INTO VAGINA EVERY 3 MONTHS (Patient taking differently: Place 2 mg vaginally every 3 (three) months.) 1 each 3  . famotidine (PEPCID) 20 MG tablet Take 1 tablet (20 mg total) by mouth daily as needed for heartburn or indigestion.    [name] FIBER PO Take 3 tablets by mouth every evening.    . fluconazole (DIFLUCAN) 150 MG tablet TAKE ONE TABLET BY MOUTH AS A ONE-TIME DOSE 1 tablet 0  . FLUoxetine (PROZAC) 40 MG capsule Take 1 capsule by mouth once daily 90 capsule 0  . fluticasone (FLONASE) 50 MCG/ACT nasal spray Place 2 sprays into both nostrils daily. 16 g 11  . gabapentin (NEURONTIN) 100 MG capsule Take 1 capsule (100 mg total) by mouth 3 (three) times daily as needed (neuropathic pain). 90 capsule 3  . glimepiride (AMARYL) 4 MG tablet TAKE 2 TABLETS BY MOUTH ONCE DAILY WITH BREAKFAST 180 tablet 0  . glucose blood (ACCU-CHEK GUIDE) test strip Use to test blood sugars daily. Dx: E11.9 100 each 6  . Lancets Misc. (ACCU-CHEK FASTCLIX LANCET) KIT Use to test blood sugars daily. Dx:  E11.9 1 kit 5  . latanoprost (XALATAN) 0.005 % ophthalmic solution Place 1 drop into the right eye at bedtime.     [name] lisinopril (ZESTRIL) 10 MG tablet Take 1 tablet by mouth once daily 90 tablet 0  . lovastatin (MEVACOR) 10 MG tablet Take 1 tablet (10 mg total) by mouth every other day. 45 tablet 3  . Misc Natural Products (NEURIVA PO) Take by mouth.    . Multiple Vitamin (MULTIVITAMIN WITH MINERALS) TABS tablet Take 1 tablet by mouth daily. Centrum Silver    . pramipexole (MIRAPEX) 0.5 MG tablet TAKE 1 TABLET BY MOUTH AT BEDTIME 90 tablet 0  . Respiratory Therapy Supplies (FLUTTER) DEVI Use as directed 1 each 0  . ROCKLATAN 0.02-0.005 % SOLN Place 1 drop into the left eye at bedtime.     [name]  Selenium 200 MCG CAPS Take 200 mcg by mouth at bedtime.     . SYMBICORT 160-4.5 MCG/ACT inhaler INHALE 2 PUFFS BY MOUTH TWICE DAILY . (Patient taking differently: Inhale 2 puffs into the lungs in the morning and at bedtime.) 10.2 g 11  . temazepam (RESTORIL) 15 MG capsule TAKE 1 CAPSULE BY MOUTH AT BEDTIME AS NEEDED FOR SLEEP (Patient taking differently: Take 15 mg by mouth at bedtime.) 30 capsule 5  . UNABLE TO FIND Fiber therapy    . VICTOZA 18 MG/3ML SOPN INJECT 1.$[id number]$  SUBCUTANEOUSLY ONCE DAILY 9 mL 3  . vitamin B-12 (CYANOCOBALAMIN) 1000 MCG tablet Take 1,000 mcg by mouth daily.     No current facility-administered medications for this visit.     Objective:  Ht 6' (1.829 m)   Wt 245 lb (111.1 kg)   LMP 05/10/1992 Comment: partial  BMI 33.23 kg/m  self reported vitals Gen: NAD, resting comfortably per voice  No results found. However, due to the size of the patient record, not all encounters were searched. Please check Results Review for a complete set of results.     Assessment and Plan   #Concern for UTI S: Patients symptoms started a week or two ago. Treated for UTI in early January with 10 days and she was not 100% confident fully resolved.   05/13/20 had Klebsiella Pneumoniae UTI treated  with 10 days of augmentin- has had several resistances on UTIs 05/05/20 Urine culture negative 03/07/20 fistula surgery with Dr. Johney Maine 02/21/20 Klebsiella pneumoniae 02/05/20 multiple species 01/25/20 klebseilla pneumoniae (only resistant to ampicillin and nitrofurantoin) 01/08/20 klebsiella  Pneumoniae (resisnat to ampicillin and nitrofurantoin)   Complains of dysuria: pelvic pressure; polyuria: yes; nocturia: every 2-3 hous; urgency: yes.  Symptoms are worsening slightly.  ROS- no fever, chills, nausea, vomiting, flank pain. No blood in urine.  A/P: UA will be obtained. Likely UTI. Will get culture. Collected urine at home but was contaminated with stool so need new sample. Empiric treatment with: augmentin - I want her to hold off on taking until we have collected clean urine sample.   Patient to follow up if new or worsening symptoms or failure to improve.  - recurrent UTI DR. Borden referral today- husband sees  Recommended follow up: keep April visit or sooner if needed Future Appointments  Date Time Provider [place]  07/08/2020  1:15 PM Megan Salon, MD [facility]  08/11/2020  1:40 PM Yong Channel Brayton Mars, MD [facility]   Lab/Order associations:   ICD-10-CM   1. Recurrent UTI  N39.0 Ambulatory referral to Urology   Meds ordered this encounter  Medications  . amoxicillin-clavulanate (AUGMENTIN) 875-125 MG tablet    Sig: Take 1 tablet by mouth 2 (two) times daily.    Dispense:  28 tablet    Refill:  0  . fluconazole (DIFLUCAN) 150 MG tablet    Sig: TAKE ONE TABLET BY MOUTH AS A ONE-TIME DOSE    Dispense:  1 tablet    Refill:  0     Return precautions advised.  [name], MD

## 2020-06-23 NOTE — Telephone Encounter (Signed)
Ok to order 

## 2020-06-23 NOTE — Telephone Encounter (Signed)
See below

## 2020-06-24 ENCOUNTER — Other Ambulatory Visit (INDEPENDENT_AMBULATORY_CARE_PROVIDER_SITE_OTHER): Payer: Medicare PPO

## 2020-06-24 ENCOUNTER — Other Ambulatory Visit: Payer: Self-pay

## 2020-06-24 DIAGNOSIS — R300 Dysuria: Secondary | ICD-10-CM | POA: Diagnosis not present

## 2020-06-24 LAB — POCT URINALYSIS DIPSTICK
Bilirubin, UA: NEGATIVE
Blood, UA: NEGATIVE
Glucose, UA: NEGATIVE
Ketones, UA: NEGATIVE
Leukocytes, UA: NEGATIVE
Nitrite, UA: POSITIVE
Protein, UA: POSITIVE — AB
Spec Grav, UA: >=1.03 — AB (ref 1.010–1.025)
Urobilinogen, UA: 0.2 E.U./dL
pH, UA: 5 (ref 5.0–8.0)

## 2020-06-24 NOTE — Progress Notes (Signed)
post

## 2020-06-24 NOTE — Addendum Note (Signed)
Addended by: Linton Ham on: 06/24/2020 03:02 PM   Modules accepted: Orders

## 2020-06-24 NOTE — Addendum Note (Signed)
Addended by: Linton Ham on: 06/24/2020 02:26 PM   Modules accepted: Orders

## 2020-06-26 ENCOUNTER — Ambulatory Visit (HOSPITAL_BASED_OUTPATIENT_CLINIC_OR_DEPARTMENT_OTHER): Payer: Medicare PPO | Admitting: Obstetrics & Gynecology

## 2020-06-26 LAB — URINE CULTURE
MICRO NUMBER:: 11536387
SPECIMEN QUALITY:: ADEQUATE

## 2020-07-04 ENCOUNTER — Other Ambulatory Visit: Payer: Self-pay | Admitting: Family Medicine

## 2020-07-08 ENCOUNTER — Other Ambulatory Visit: Payer: Self-pay

## 2020-07-08 ENCOUNTER — Ambulatory Visit (HOSPITAL_BASED_OUTPATIENT_CLINIC_OR_DEPARTMENT_OTHER): Payer: Medicare PPO | Admitting: Obstetrics & Gynecology

## 2020-07-08 ENCOUNTER — Encounter (HOSPITAL_BASED_OUTPATIENT_CLINIC_OR_DEPARTMENT_OTHER): Payer: Self-pay | Admitting: Obstetrics & Gynecology

## 2020-07-08 VITALS — BP 140/70 | Ht 70.0 in | Wt 264.0 lb

## 2020-07-08 DIAGNOSIS — K603 Anal fistula: Secondary | ICD-10-CM

## 2020-07-08 DIAGNOSIS — N3941 Urge incontinence: Secondary | ICD-10-CM

## 2020-07-08 DIAGNOSIS — N952 Postmenopausal atrophic vaginitis: Secondary | ICD-10-CM

## 2020-07-08 DIAGNOSIS — N390 Urinary tract infection, site not specified: Secondary | ICD-10-CM

## 2020-07-08 MED ORDER — ESTRING 2 MG VA RING: VAGINAL_RING | VAGINAL | 4 refills | Status: DC

## 2020-07-08 MED ORDER — ESTRING 2 MG VA RING
VAGINAL_RING | VAGINAL | 4 refills | Status: DC
Start: 2020-07-08 — End: 2020-07-08

## 2020-07-08 NOTE — Progress Notes (Signed)
GYNECOLOGY  VISIT  CC:   Vaginal atrophy, estring removal  HPI: 80 y.o. G0P0 Married White or Caucasian female here for estring removal and replacement.  Has hx of vaginal atrophy.  No recent vaginal discharge or itching.  Denies urinary complaints.  Has hx of peri-rectal fistula that was repaired 11/2019.  She ended up having a second procedure 03/07/2020.  Reports she has some bleeding from this a month or so ago.  Would like me to look at this today.  Denies pain.  Did have what felt like a buttocks boil that ruptured yesterday.  Would also like me to look at this.  Denies fever.  Since fistula repair, states she is having more urgency with bladder and some leakage of urine.  Denies dysuria or hematuria.  Would like evaluation.  Has been referred to urology.  Would like female provider.  GYNECOLOGIC HISTORY: Patient's last menstrual period was 05/10/1992. Contraception: PMP Menopausal hormone therapy: none  Patient Active Problem List   Diagnosis Date Noted  . 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 11/22/2019  . Healthcare maintenance 02/22/2019  . Physical deconditioning 02/22/2019  . Shortness of breath 02/22/2019  . Adenomatous polyp 12/20/2018  . Pain due to onychomycosis of toenails of both feet 11/08/2018  . Diabetic neuropathy ([place]) 11/08/2018  . History of total knee replacement, bilateral 11/14/2017  . Personal history of colonic polyps 05/18/2017  . Arthritis of midfoot 03/03/2016  . Hammertoes of both feet 03/03/2016  . Aortic atherosclerosis ([place]) 02/16/2016  . Solitary pulmonary nodule 01/08/2016  . 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  . Bronchiectasis ([place]) 10/18/2015  . IBS (irritable bowel syndrome) 07/17/2014  . Recurrent major depression in partial remission ([place]) 01/03/2014  . Posterior tibial tendon dysfunction 01/03/2014  . Osteoarthritis, knee 01/03/2014  . Glaucoma 01/03/2014  . Hypertension associated with diabetes ([place]) 01/03/2014  . Obesity (BMI  30-39.9) 06/15/2013  . Primary open-angle glaucoma 08/02/2012  . Foot tendinitis 07/20/2010  . INSOMNIA, CHRONIC 07/25/2009  . Fatty liver 03/18/2009  . GERD 12/25/2007  . Hyperlipidemia associated with type 2 diabetes mellitus ([place]) 07/26/2007  . ANEMIA, B12 DEFICIENCY 12/29/2006  . Diabetes mellitus type II, controlled ([place]) 12/28/2006  . RESTLESS LEG SYNDROME, MILD 12/28/2006  . NEUROPATHY, IDIOPATHIC PERIPHERAL NEC 12/28/2006  . Osteoporosis 12/12/2006  . BREAST CANCER, HX OF 12/12/2006    Past Medical History:  Diagnosis Date  . Allergy   . Anemia   . Anxiety   . Arthritis    right knee;injections every 85month   . Asthma    use daily  . Breast cancer ([place] 1994/1995   HX BREAST CANCER/ right brreast and left breast in 1995  . Bronchiectasis ([place]   . COMPRESSION FRACTURE, LUMBAR VERTEBRAE 08/21/2008   Qualifier: Diagnosis of  By: JArnoldo MoraleMD, JBalinda Quails  . Depression   . Diabetes mellitus    takes Amaryl and Januvia daily  . Difficulty sleeping   . Diverticulitis   . Dyspnea    with activity  . Early cataracts, bilateral   . Eczema   . Endometriosis   . Fatty liver   . Gastritis   . Glaucoma   . Hammer toe   . History of kidney stones   . Hyperlipidemia associated with type 2 diabetes mellitus ([place] 07/26/2007   Diet/exercise control.    . Hypertension    takes Atenolol daily  . IBS (irritable bowel syndrome)   . Joint pain   . Neuropathy    BILATERAL FEET AND MID  CALF  . Neuropathy due to medical condition (HCC)    bi lat legs/feet  . Open wound of second toe of left foot in past   at pre-op appt, wound appears clear of infection 1 month post removal of toenail, no obvious exudate present nor any redness,  . Osteomyelitis ([place])    left 2nd toe  . Osteopenia   . Perirectal fistula   . Posterior tibial tendon dysfunction    left foot  . Restless leg syndrome   . Scoliosis   . Severe esophageal dysplasia   . 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 Thyroid disease   . Vitamin D deficiency    takes Vit d daily  . Weakness    uses a walker and wheelchair    Past Surgical History:  Procedure Laterality Date  . ADENOIDECTOMY     at age 54  . ANAL FISTULOTOMY N/A 03/07/2020   Procedure: ANAL FISTULOTOMY WITH MARSUPIALIZATION;  Surgeon: Michael Boston, MD;  Location[facility];  Service: General;  Laterality: N/A;  . BIOPSY  12/19/2018   Procedure: BIOPSY;  Surgeon: Mauri Pole, MD;  Location: [facility];  Service: Endoscopy;;  . CATARACT EXTRACTION    . CHOLECYSTECTOMY  06/2008  . COLONOSCOPY WITH PROPOFOL N/A 05/17/2017   Procedure: COLONOSCOPY WITH PROPOFOL;  Surgeon: Mauri Pole, MD;  Location: [facility];  Service: Endoscopy;  Laterality: N/A;  PT WILL BE ADMITTED THE DAY BEFORE FOR PREP PER ROBIN KB  . COLONOSCOPY WITH PROPOFOL N/A 12/19/2018   Procedure: COLONOSCOPY WITH PROPOFOL;  Surgeon: Mauri Pole, MD;  Location: [facility];  Service: Endoscopy;  Laterality: N/A;  . DILATION AND CURETTAGE OF UTERUS    . excision on breast     internal infected suture from breast surgery  . excision removed from neck     infected lymph node  . FOOT SURGERY     left foot-cleaning out areas on toes at the wound center-having MRI to determine if osteomyelitis  . HEMORRHOID SURGERY N/A 11/22/2019   Procedure: HEMORRHOIDECTOMY LIGATION , PEXY;  Surgeon: Michael Boston, MD;  Location: [place];  Service: General;  Laterality: N/A;  . HYPERBARIC OXYGEN THERAPY     FEET INFECTION  . HYSTEROSCOPY  06/22/11   PMB submucosal myoma  . JOINT REPLACEMENT     left total shoulder  . LAPAROSCOPIC OOPHORECTOMY Right 12/2004   absent LSO  . LAPAROTOMY    . LIGATION OF INTERNAL FISTULA TRACT N/A 11/22/2019   Procedure: REPAIR OF PERIRECTAL FISTULA, ANORECTAL EXAMINATION UNDER ANESTHESIA;  Surgeon: Michael Boston, MD;  Location: [place];  Service:  General;  Laterality: N/A;  . MASTECTOMY Bilateral 1994 right, 1995 left  . POLYPECTOMY  12/19/2018   Procedure: POLYPECTOMY;  Surgeon: Mauri Pole, MD;  Location: [facility];  Service: Endoscopy;;  . RECTAL EXAM UNDER ANESTHESIA N/A 03/07/2020   Procedure: ANORECTAL EXAM UNDER ANESTHESIA;  Surgeon: Michael Boston, MD;  Location: [place];  Service: General;  Laterality: N/A;  . SHOULDER HEMI-ARTHROPLASTY  01/01/2012   Procedure: SHOULDER HEMI-ARTHROPLASTY;  Surgeon: Roseanne Kaufman, MD;  Location: [place];  Service: Orthopedics;  Laterality: Left;  Left Shoulder Hemi Arthroplasty with Repair and Reconstruction as Necessary   . THYROIDECTOMY     29yr ago. follows endocrine  . TONSILLECTOMY    . TOTAL KNEE ARTHROPLASTY Right 12/30/2014   Procedure: RIGHT TOTAL KNEE ARTHROPLASTY;  Surgeon: Gaynelle Arabian, MD;  Location: [facility];  Service: Orthopedics;  Laterality: Right;  . TOTAL KNEE ARTHROPLASTY Left 11/29/2016   Procedure: LEFT TOTAL KNEE ARTHROPLASTY;  Surgeon: Gaynelle Arabian, MD;  Location: [facility];  Service: Orthopedics;  Laterality: Left;    MEDS:   Current Outpatient Medications on File Prior to Visit  Medication Sig Dispense Refill  . temazepam (RESTORIL) 15 MG capsule TAKE 1 CAPSULE BY MOUTH AT BEDTIME AS NEEDED FOR SLEEP 30 capsule 5  . ACCU-CHEK GUIDE test strip USE 1 TO TEST UP TO TWICE DAILY 200 each 0  . amoxicillin-clavulanate (AUGMENTIN) 875-125 MG tablet Take 1 tablet by mouth 2 (two) times daily. 28 tablet 0  . atenolol (TENORMIN) 25 MG tablet Take 1 tablet by mouth once daily 90 tablet 0  . Bacillus Coagulans-Inulin (ALIGN PREBIOTIC-PROBIOTIC PO) Take 1 capsule by mouth daily.     . blood glucose meter kit and supplies KIT Dispense based on patient and insurance preference. Use up to four times daily as directed. Dx E11.9 1 each 0  . cetirizine (ZYRTEC) 10 MG tablet Take 10 mg by mouth every morning.     . Cholecalciferol (VITAMIN D3) 125 MCG (5000 UT)  TABS Take 5,000 Units by mouth daily.    . Coenzyme Q10 (COQ10) 200 MG CAPS Take 200 mg by mouth daily.    . colesevelam (WELCHOL) 625 MG tablet TAKE 1 TABLET BY MOUTH ONCE DAILY IN THE MORNING AND AT NOON AND AT BEDTIME 90 tablet 3  . dicyclomine (BENTYL) 20 MG tablet Take 1 tablet (20 mg total) by mouth in the morning, at noon, in the evening, and at bedtime. 120 tablet 5  . dorzolamide-timolol (COSOPT) 22.3-6.8 MG/ML ophthalmic solution Place 1 drop into both eyes 2 (two) times daily.     . famotidine (PEPCID) 20 MG tablet Take 1 tablet (20 mg total) by mouth daily as needed for heartburn or indigestion.    [name] FIBER PO Take 3 tablets by mouth every evening.    . fluconazole (DIFLUCAN) 150 MG tablet TAKE ONE TABLET BY MOUTH AS A ONE-TIME DOSE 1 tablet 0  . FLUoxetine (PROZAC) 40 MG capsule Take 1 capsule by mouth once daily 90 capsule 0  . fluticasone (FLONASE) 50 MCG/ACT nasal spray Place 2 sprays into both nostrils daily. 16 g 11  . gabapentin (NEURONTIN) 100 MG capsule Take 1 capsule (100 mg total) by mouth 3 (three) times daily as needed (neuropathic pain). 90 capsule 3  . glimepiride (AMARYL) 4 MG tablet TAKE 2 TABLETS BY MOUTH ONCE DAILY WITH BREAKFAST 180 tablet 0  . glucose blood (ACCU-CHEK GUIDE) test strip Use to test blood sugars daily. Dx: E11.9 100 each 6  . Lancets Misc. (ACCU-CHEK FASTCLIX LANCET) KIT Use to test blood sugars daily. Dx: E11.9 1 kit 5  . latanoprost (XALATAN) 0.005 % ophthalmic solution Place 1 drop into the right eye at bedtime.     [name] lisinopril (ZESTRIL) 10 MG tablet Take 1 tablet by mouth once daily 90 tablet 0  . lovastatin (MEVACOR) 10 MG tablet Take 1 tablet (10 mg total) by mouth every other day. 45 tablet 3  . Misc Natural Products (NEURIVA PO) Take by mouth.    . Multiple Vitamin (MULTIVITAMIN WITH MINERALS) TABS tablet Take 1 tablet by mouth daily. Centrum Silver    . pramipexole (MIRAPEX) 0.5 MG tablet TAKE 1 TABLET BY MOUTH AT BEDTIME 90 tablet 0  .  Respiratory Therapy Supplies (FLUTTER) DEVI Use as directed  1 each 0  . ROCKLATAN 0.02-0.005 % SOLN Place 1 drop into the left eye at bedtime.     . Selenium 200 MCG CAPS Take 200 mcg by mouth at bedtime.     . SYMBICORT 160-4.5 MCG/ACT inhaler INHALE 2 PUFFS BY MOUTH TWICE DAILY . (Patient taking differently: Inhale 2 puffs into the lungs in the morning and at bedtime.) 10.2 g 11  . UNABLE TO FIND Fiber therapy    . VICTOZA 18 MG/3ML SOPN INJECT 1.8MG SUBCUTANEOUSLY ONCE DAILY 9 mL 3  . vitamin B-12 (CYANOCOBALAMIN) 1000 MCG tablet Take 1,000 mcg by mouth daily.     No current facility-administered medications on file prior to visit.    ALLERGIES: Nitrofurantoin, Levaquin [levofloxacin in d5w], Brimonidine, Cephalexin, Erythromycin ethylsuccinate, and Oxycodone  Family History  Problem Relation Age of Onset  . Arthritis Mother   . COPD Father   . Heart disease Father        MI 57 - states he died of lockjaw  . Hypertension Father   . Hyperlipidemia Father   . Pancreatic cancer Paternal Grandfather   . Colon cancer Neg Hx   . Esophageal cancer Neg Hx   . Rectal cancer Neg Hx   . Stomach cancer Neg Hx     SH:  Married, non smoker  Review of Systems  Constitutional: Negative.   Gastrointestinal: Negative.   Genitourinary: Negative.     PHYSICAL EXAMINATION:    BP 140/70 (BP Location: Right Arm, Patient Position: Sitting, Cuff Size: Large)   Ht 5' 10" (1.778 m)   Wt 264 lb (119.7 kg)   LMP 05/10/1992 Comment: partial  BMI 37.88 kg/m     General appearance: alert, cooperative and appears stated age Lymph:  no inguinal LAD noted  Pelvic: External genitalia:  no lesions              Urethra:  normal appearing urethra with no masses, tenderness or lesions              Bartholins and Skenes: normal                 Vagina: normal appearing vagina with normal color and discharge, no lesions, Estring removed and replaced after pelvic exam completed              Cervix: no  lesions              Bimanual Exam:  Uterus:  normal size, contour, position, consistency, mobility, non-tender              Adnexa: no mass, fullness, tenderness  Physical Exam Genitourinary:     Chaperone, Britt Bottom, CMA, was present for exam.  Assessment/Plan: 1. Vaginal atrophy - ESTRING 2 MG vaginal ring; INSERT 1 RING  INTO VAGINA EVERY 3 MONTHS  Dispense: 1 each; Refill: 4 - return in 3-4 months for recheck  2. Urge incontinence of urine - Ambulatory referral to Urogynecology  3. 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 - appears to be healing well.  Pt reassured.

## 2020-07-10 ENCOUNTER — Other Ambulatory Visit: Payer: Self-pay | Admitting: Family Medicine

## 2020-07-15 ENCOUNTER — Encounter: Payer: Self-pay | Admitting: Family Medicine

## 2020-07-17 ENCOUNTER — Other Ambulatory Visit (INDEPENDENT_AMBULATORY_CARE_PROVIDER_SITE_OTHER): Payer: Medicare PPO

## 2020-07-17 ENCOUNTER — Other Ambulatory Visit: Payer: Self-pay | Admitting: *Deleted

## 2020-07-17 DIAGNOSIS — N390 Urinary tract infection, site not specified: Secondary | ICD-10-CM | POA: Diagnosis not present

## 2020-07-17 NOTE — Telephone Encounter (Signed)
Pt called following up on MyChart message. Pt is going to come drop off a urine sample. Please put order in pt chart.

## 2020-07-17 NOTE — Telephone Encounter (Signed)
Order placed

## 2020-07-19 ENCOUNTER — Encounter: Payer: Self-pay | Admitting: Family Medicine

## 2020-07-19 LAB — URINE CULTURE
MICRO NUMBER:: 11632350
SPECIMEN QUALITY:: ADEQUATE

## 2020-07-21 ENCOUNTER — Encounter: Payer: Self-pay | Admitting: Family Medicine

## 2020-07-21 ENCOUNTER — Other Ambulatory Visit: Payer: Self-pay

## 2020-07-21 MED ORDER — AMOXICILLIN-POT CLAVULANATE 875-125 MG PO TABS: 1.0000 | ORAL_TABLET | Freq: Two times a day (BID) | ORAL | 0 refills | Status: DC

## 2020-07-21 NOTE — Telephone Encounter (Signed)
Spoke with patient scheduled appt to go over Rx

## 2020-07-23 NOTE — Patient Instructions (Incomplete)
Health Maintenance Due  Topic Date Due  . TETANUS/TDAP  12/24/2017  . COVID-19 Vaccine (3 - Booster for Pfizer series) 12/23/2019  . OPHTHALMOLOGY EXAM  02/29/2020  . FOOT EXAM  06/17/2020   Depression screen [facility] 2/9 04/21/2020 01/08/2020 09/24/2019  Decreased Interest 2 3 3   Down, Depressed, Hopeless 2 3 3   PHQ - 2 Score 4 6 6   Altered sleeping 0 0 2  Tired, decreased energy 3 3 3   Change in appetite 0 3 0  Feeling bad or failure about yourself  0 0 0  Trouble concentrating 0 0 0  Moving slowly or fidgety/restless 0 0 0  Suicidal thoughts 0 0 0  PHQ-9 Score 7 12 11   Difficult doing work/chores Somewhat difficult Not difficult at all Somewhat difficult  Some recent data might be hidden

## 2020-07-23 NOTE — Progress Notes (Deleted)
Phone [phone] In person visit   Subjective:   [name] is a 80 y.o. year old very pleasant female patient who presents for/with See problem oriented charting No chief complaint on file.   This visit occurred during the SARS-CoV-2 public health emergency.  Safety protocols were in place, including screening questions prior to the visit, additional usage of staff PPE, and extensive cleaning of exam room while observing appropriate contact time as indicated for disinfecting solutions.   Past Medical History-  Patient Active Problem List   Diagnosis Date Noted  . 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 11/22/2019  . Healthcare maintenance 02/22/2019  . Physical deconditioning 02/22/2019  . Shortness of breath 02/22/2019  . Adenomatous polyp 12/20/2018  . Pain due to onychomycosis of toenails of both feet 11/08/2018  . Diabetic neuropathy ([place]) 11/08/2018  . History of total knee replacement, bilateral 11/14/2017  . Personal history of colonic polyps 05/18/2017  . Arthritis of midfoot 03/03/2016  . Hammertoes of both feet 03/03/2016  . Aortic atherosclerosis ([place]) 02/16/2016  . Solitary pulmonary nodule 01/08/2016  . 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  . Bronchiectasis ([place]) 10/18/2015  . IBS (irritable bowel syndrome) 07/17/2014  . Recurrent major depression in partial remission ([place]) 01/03/2014  . Posterior tibial tendon dysfunction 01/03/2014  . Osteoarthritis, knee 01/03/2014  . Glaucoma 01/03/2014  . Hypertension associated with diabetes ([place]) 01/03/2014  . Obesity (BMI 30-39.9) 06/15/2013  . Primary open-angle glaucoma 08/02/2012  . Foot tendinitis 07/20/2010  . INSOMNIA, CHRONIC 07/25/2009  . Fatty liver 03/18/2009  . GERD 12/25/2007  . Hyperlipidemia associated with type 2 diabetes mellitus ([place]) 07/26/2007  . ANEMIA, B12 DEFICIENCY 12/29/2006  . Diabetes mellitus type II, controlled ([place]) 12/28/2006  . RESTLESS LEG SYNDROME, MILD 12/28/2006  .  NEUROPATHY, IDIOPATHIC PERIPHERAL NEC 12/28/2006  . Osteoporosis 12/12/2006  . BREAST CANCER, HX OF 12/12/2006    Medications- reviewed and updated Current Outpatient Medications  Medication Sig Dispense Refill  . temazepam (RESTORIL) 15 MG capsule TAKE 1 CAPSULE BY MOUTH AT BEDTIME AS NEEDED FOR SLEEP 30 capsule 5  . ACCU-CHEK GUIDE test strip USE 1 TO TEST UP TO TWICE DAILY 200 each 0  . amoxicillin-clavulanate (AUGMENTIN) 875-125 MG tablet Take 1 tablet by mouth 2 (two) times daily. 28 tablet 0  . atenolol (TENORMIN) 25 MG tablet Take 1 tablet by mouth once daily 90 tablet 0  . Bacillus Coagulans-Inulin (ALIGN PREBIOTIC-PROBIOTIC PO) Take 1 capsule by mouth daily.     . blood glucose meter kit and supplies KIT Dispense based on patient and insurance preference. Use up to four times daily as directed. Dx E11.9 1 each 0  . cetirizine (ZYRTEC) 10 MG tablet Take 10 mg by mouth every morning.     . Cholecalciferol (VITAMIN D3) 125 MCG (5000 UT) TABS Take 5,000 Units by mouth daily.    . Coenzyme Q10 (COQ10) 200 MG CAPS Take 200 mg by mouth daily.    . colesevelam (WELCHOL) 625 MG tablet TAKE 1 TABLET BY MOUTH ONCE DAILY IN THE MORNING AND AT NOON AND AT BEDTIME 90 tablet 3  . dicyclomine (BENTYL) 20 MG tablet Take 1 tablet (20 mg total) by mouth in the morning, at noon, in the evening, and at bedtime. 120 tablet 5  . dorzolamide-timolol (COSOPT) 22.3-6.8 MG/ML ophthalmic solution Place 1 drop into both eyes 2 (two) times daily.     [name] ESTRING 2 MG vaginal ring INSERT 1 RING  INTO VAGINA EVERY 3 MONTHS 1 each 4  .  famotidine (PEPCID) 20 MG tablet Take 1 tablet (20 mg total) by mouth daily as needed for heartburn or indigestion.    [name] FIBER PO Take 3 tablets by mouth every evening.    . fluconazole (DIFLUCAN) 150 MG tablet TAKE ONE TABLET BY MOUTH AS A ONE-TIME DOSE 1 tablet 0  . FLUoxetine (PROZAC) 40 MG capsule Take 1 capsule by mouth once daily 90 capsule 0  . fluticasone (FLONASE) 50 MCG/ACT  nasal spray Place 2 sprays into both nostrils daily. 16 g 11  . gabapentin (NEURONTIN) 100 MG capsule Take 1 capsule (100 mg total) by mouth 3 (three) times daily as needed (neuropathic pain). 90 capsule 3  . glimepiride (AMARYL) 4 MG tablet TAKE 2 TABLETS BY MOUTH ONCE DAILY WITH BREAKFAST 180 tablet 0  . glucose blood (ACCU-CHEK GUIDE) test strip Use to test blood sugars daily. Dx: E11.9 100 each 6  . Lancets Misc. (ACCU-CHEK FASTCLIX LANCET) KIT Use to test blood sugars daily. Dx: E11.9 1 kit 5  . latanoprost (XALATAN) 0.005 % ophthalmic solution Place 1 drop into the right eye at bedtime.     [name] lisinopril (ZESTRIL) 10 MG tablet Take 1 tablet by mouth once daily 90 tablet 0  . lovastatin (MEVACOR) 10 MG tablet Take 1 tablet (10 mg total) by mouth every other day. 45 tablet 3  . Misc Natural Products (NEURIVA PO) Take by mouth.    . Multiple Vitamin (MULTIVITAMIN WITH MINERALS) TABS tablet Take 1 tablet by mouth daily. Centrum Silver    . pramipexole (MIRAPEX) 0.5 MG tablet TAKE 1 TABLET BY MOUTH AT BEDTIME 90 tablet 0  . Respiratory Therapy Supplies (FLUTTER) DEVI Use as directed 1 each 0  . ROCKLATAN 0.02-0.005 % SOLN Place 1 drop into the left eye at bedtime.     . Selenium 200 MCG CAPS Take 200 mcg by mouth at bedtime.     . SYMBICORT 160-4.5 MCG/ACT inhaler INHALE 2 PUFFS BY MOUTH TWICE DAILY . (Patient taking differently: Inhale 2 puffs into the lungs in the morning and at bedtime.) 10.2 g 11  . UNABLE TO FIND Fiber therapy    . VICTOZA 18 MG/3ML SOPN INJECT 1.$RemoveBefor'8MG'[id number]$  SUBCUTANEOUSLY ONCE DAILY 9 mL 3  . vitamin B-12 (CYANOCOBALAMIN) 1000 MCG tablet Take 1,000 mcg by mouth daily.     No current facility-administered medications for this visit.     Objective:  LMP 05/10/1992 Comment: partial Gen: NAD, resting comfortably CV: RRR no murmurs rubs or gallops Lungs: CTAB no crackles, wheeze, rhonchi Abdomen: soft/nontender/nondistended/normal bowel sounds. No rebound or guarding.  Ext: no  edema Skin: warm, dry Neuro: grossly normal, moves all extremities  ***    Assessment and Plan  Discuss Meds S:***  A/P: ***     Dr. Sabra Heck- ob/gyn. Estring.  Dr. Mickel Baas [place]-dermatology Dr. Calvert Cantor- optho Dr. Cresenciano [facility] optho. Glaucoma.  Dr. Verita Schneiders (hyperbaric for infection had in feet-ear plugs) Dr. Pilar Plate Allusio-ortho Dr. Delfin Edis- GI (no longer sees) Dr. Neldon Mc (surgeon)  ***01/19/18 awv- recommended again 06/18/19  ***I&D anal abscess then fistula procedure with Dr. gross oct 2021  ***Consider urolgoy referral if recurrent UTI- refer on 06/23/20  No problem-specific Assessment & Plan notes found for this encounter.   Recommended follow up: ***No follow-ups on file. Future Appointments  Date Time Provider [place]  07/28/2020  1:00 PM Marin Olp, MD [facility]  08/11/2020  1:40 PM Marin Olp, MD [facility]  08/22/2020  2:40 PM Wannetta Sender,  Governor Rooks, MD [facility]    Lab/Order associations: No diagnosis found.  No orders of the defined types were placed in this encounter.   Time Spent: *** minutes of total time (7:47 PM***- 7:47 PM***) was spent on the date of the encounter performing the following actions: chart review prior to seeing the patient, obtaining history, performing a medically necessary exam, counseling on the treatment plan, placing orders, and documenting in our EHR.   Return precautions advised.  Clyde Lundborg, CMA

## 2020-07-28 ENCOUNTER — Ambulatory Visit: Payer: Medicare PPO | Admitting: Family Medicine

## 2020-07-29 ENCOUNTER — Other Ambulatory Visit (HOSPITAL_BASED_OUTPATIENT_CLINIC_OR_DEPARTMENT_OTHER): Payer: Self-pay | Admitting: Obstetrics & Gynecology

## 2020-07-29 DIAGNOSIS — N952 Postmenopausal atrophic vaginitis: Secondary | ICD-10-CM

## 2020-07-29 MED ORDER — ESTRING 2 MG VA RING: VAGINAL_RING | VAGINAL | 4 refills | Status: DC

## 2020-07-30 ENCOUNTER — Other Ambulatory Visit: Payer: Self-pay | Admitting: Family Medicine

## 2020-07-30 NOTE — Progress Notes (Deleted)
Phone [phone] In person visit   Subjective:   [name] is a 80 y.o. year old very pleasant female patient who presents for/with See problem oriented charting No chief complaint on file.   This visit occurred during the SARS-CoV-2 public health emergency.  Safety protocols were in place, including screening questions prior to the visit, additional usage of staff PPE, and extensive cleaning of exam room while observing appropriate contact time as indicated for disinfecting solutions.   Past Medical History-  Patient Active Problem List   Diagnosis Date Noted  . 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 11/22/2019  . Healthcare maintenance 02/22/2019  . Physical deconditioning 02/22/2019  . Shortness of breath 02/22/2019  . Adenomatous polyp 12/20/2018  . Pain due to onychomycosis of toenails of both feet 11/08/2018  . Diabetic neuropathy ([place]) 11/08/2018  . History of total knee replacement, bilateral 11/14/2017  . Personal history of colonic polyps 05/18/2017  . Arthritis of midfoot 03/03/2016  . Hammertoes of both feet 03/03/2016  . Aortic atherosclerosis ([place]) 02/16/2016  . Solitary pulmonary nodule 01/08/2016  . 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  . Bronchiectasis ([place]) 10/18/2015  . IBS (irritable bowel syndrome) 07/17/2014  . Recurrent major depression in partial remission ([place]) 01/03/2014  . Posterior tibial tendon dysfunction 01/03/2014  . Osteoarthritis, knee 01/03/2014  . Glaucoma 01/03/2014  . Hypertension associated with diabetes ([place]) 01/03/2014  . Obesity (BMI 30-39.9) 06/15/2013  . Primary open-angle glaucoma 08/02/2012  . Foot tendinitis 07/20/2010  . INSOMNIA, CHRONIC 07/25/2009  . Fatty liver 03/18/2009  . GERD 12/25/2007  . Hyperlipidemia associated with type 2 diabetes mellitus ([place]) 07/26/2007  . ANEMIA, B12 DEFICIENCY 12/29/2006  . Diabetes mellitus type II, controlled ([place]) 12/28/2006  . RESTLESS LEG SYNDROME, MILD 12/28/2006  .  NEUROPATHY, IDIOPATHIC PERIPHERAL NEC 12/28/2006  . Osteoporosis 12/12/2006  . BREAST CANCER, HX OF 12/12/2006    Medications- reviewed and updated Current Outpatient Medications  Medication Sig Dispense Refill  . temazepam (RESTORIL) 15 MG capsule TAKE 1 CAPSULE BY MOUTH AT BEDTIME AS NEEDED FOR SLEEP 30 capsule 5  . ACCU-CHEK GUIDE test strip USE 1 TO TEST UP TO TWICE DAILY 200 each 0  . amoxicillin-clavulanate (AUGMENTIN) 875-125 MG tablet Take 1 tablet by mouth 2 (two) times daily. 28 tablet 0  . atenolol (TENORMIN) 25 MG tablet Take 1 tablet by mouth once daily 90 tablet 0  . Bacillus Coagulans-Inulin (ALIGN PREBIOTIC-PROBIOTIC PO) Take 1 capsule by mouth daily.     . blood glucose meter kit and supplies KIT Dispense based on patient and insurance preference. Use up to four times daily as directed. Dx E11.9 1 each 0  . cetirizine (ZYRTEC) 10 MG tablet Take 10 mg by mouth every morning.     . Cholecalciferol (VITAMIN D3) 125 MCG (5000 UT) TABS Take 5,000 Units by mouth daily.    . Coenzyme Q10 (COQ10) 200 MG CAPS Take 200 mg by mouth daily.    . colesevelam (WELCHOL) 625 MG tablet TAKE 1 TABLET BY MOUTH ONCE DAILY IN THE MORNING AND AT NOON AND AT BEDTIME 90 tablet 3  . dicyclomine (BENTYL) 20 MG tablet Take 1 tablet (20 mg total) by mouth in the morning, at noon, in the evening, and at bedtime. 120 tablet 5  . dorzolamide-timolol (COSOPT) 22.3-6.8 MG/ML ophthalmic solution Place 1 drop into both eyes 2 (two) times daily.     [name] ESTRING 2 MG vaginal ring INSERT 1 RING  INTO VAGINA EVERY 3 MONTHS 1 each 4  .  famotidine (PEPCID) 20 MG tablet Take 1 tablet (20 mg total) by mouth daily as needed for heartburn or indigestion.    [name] FIBER PO Take 3 tablets by mouth every evening.    . fluconazole (DIFLUCAN) 150 MG tablet TAKE ONE TABLET BY MOUTH AS A ONE-TIME DOSE 1 tablet 0  . FLUoxetine (PROZAC) 40 MG capsule Take 1 capsule by mouth once daily 90 capsule 0  . fluticasone (FLONASE) 50 MCG/ACT  nasal spray Place 2 sprays into both nostrils daily. 16 g 11  . gabapentin (NEURONTIN) 100 MG capsule TAKE 1 CAPSULE BY MOUTH THREE TIMES DAILY AS NEEDED (NEUROPATHIC  PAIN) 90 capsule 0  . glimepiride (AMARYL) 4 MG tablet TAKE 2 TABLETS BY MOUTH ONCE DAILY WITH BREAKFAST 180 tablet 0  . glucose blood (ACCU-CHEK GUIDE) test strip Use to test blood sugars daily. Dx: E11.9 100 each 6  . Lancets Misc. (ACCU-CHEK FASTCLIX LANCET) KIT Use to test blood sugars daily. Dx: E11.9 1 kit 5  . latanoprost (XALATAN) 0.005 % ophthalmic solution Place 1 drop into the right eye at bedtime.     [name] lisinopril (ZESTRIL) 10 MG tablet Take 1 tablet by mouth once daily 90 tablet 0  . lovastatin (MEVACOR) 10 MG tablet Take 1 tablet (10 mg total) by mouth every other day. 45 tablet 3  . Misc Natural Products (NEURIVA PO) Take by mouth.    . Multiple Vitamin (MULTIVITAMIN WITH MINERALS) TABS tablet Take 1 tablet by mouth daily. Centrum Silver    . pramipexole (MIRAPEX) 0.5 MG tablet TAKE 1 TABLET BY MOUTH AT BEDTIME 90 tablet 0  . Respiratory Therapy Supplies (FLUTTER) DEVI Use as directed 1 each 0  . ROCKLATAN 0.02-0.005 % SOLN Place 1 drop into the left eye at bedtime.     . Selenium 200 MCG CAPS Take 200 mcg by mouth at bedtime.     . SYMBICORT 160-4.5 MCG/ACT inhaler INHALE 2 PUFFS BY MOUTH TWICE DAILY . (Patient taking differently: Inhale 2 puffs into the lungs in the morning and at bedtime.) 10.2 g 11  . UNABLE TO FIND Fiber therapy    . VICTOZA 18 MG/3ML SOPN INJECT 1.8MG SUBCUTANEOUSLY ONCE DAILY 9 mL 3  . vitamin B-12 (CYANOCOBALAMIN) 1000 MCG tablet Take 1,000 mcg by mouth daily.     No current facility-administered medications for this visit.     Objective:  LMP 05/10/1992 Comment: partial Gen: NAD, resting comfortably CV: RRR no murmurs rubs or gallops Lungs: CTAB no crackles, wheeze, rhonchi Abdomen: soft/nontender/nondistended/normal bowel sounds. No rebound or guarding.  Ext: no edema Skin: warm,  dry Neuro: grossly normal, moves all extremities  ***    Assessment and Plan  Discuss Meds S:***  A/P: ***     Dr. Sabra Heck- ob/gyn. Estring.  Dr. Mickel Baas [place]-dermatology Dr. Calvert Cantor- optho Dr. Cresenciano [facility] optho. Glaucoma.  Dr. Verita Schneiders (hyperbaric for infection had in feet-ear plugs) Dr. Pilar Plate Allusio-ortho Dr. Delfin Edis- GI (no longer sees) Dr. Neldon Mc (surgeon)  ***01/19/18 awv- recommended again 06/18/19  ***I&D anal abscess then fistula procedure with Dr. gross oct 2021  ***Consider urolgoy referral if recurrent UTI- refer on 06/23/20  No problem-specific Assessment & Plan notes found for this encounter.   Recommended follow up: ***No follow-ups on file. Future Appointments  Date Time Provider [place]  08/04/2020  2:20 PM Marin Olp, MD [facility]  08/11/2020  1:40 PM Marin Olp, MD [facility]  08/22/2020  2:40 PM Jaquita Folds, MD  [facility]  11/14/2020  1:15 PM Megan Salon, MD [facility]    Lab/Order associations: No diagnosis found.  No orders of the defined types were placed in this encounter.   Time Spent: *** minutes of total time (10:12 PM***- 10:12 PM***) was spent on the date of the encounter performing the following actions: chart review prior to seeing the patient, obtaining history, performing a medically necessary exam, counseling on the treatment plan, placing orders, and documenting in our EHR.   Return precautions advised.  Clyde Lundborg, CMA

## 2020-07-30 NOTE — Patient Instructions (Incomplete)
Diabetes Health Maintenance Due  Topic Date Due  . OPHTHALMOLOGY EXAM  02/29/2020  . FOOT EXAM  06/17/2020  . HEMOGLOBIN A1C  10/20/2020   Depression screen [facility] 2/9 04/21/2020 01/08/2020 09/24/2019  Decreased Interest 2 3 3   Down, Depressed, Hopeless 2 3 3   PHQ - 2 Score 4 6 6   Altered sleeping 0 0 2  Tired, decreased energy 3 3 3   Change in appetite 0 3 0  Feeling bad or failure about yourself  0 0 0  Trouble concentrating 0 0 0  Moving slowly or fidgety/restless 0 0 0  Suicidal thoughts 0 0 0  PHQ-9 Score 7 12 11   Difficult doing work/chores Somewhat difficult Not difficult at all Somewhat difficult  Some recent data might be hidden

## 2020-08-01 ENCOUNTER — Other Ambulatory Visit: Payer: Self-pay | Admitting: Family Medicine

## 2020-08-01 ENCOUNTER — Telehealth: Payer: Self-pay

## 2020-08-01 MED ORDER — VICTOZA 18 MG/3ML ~~LOC~~ SOPN: PEN_INJECTOR | SUBCUTANEOUS | 3 refills | Status: DC

## 2020-08-01 NOTE — Telephone Encounter (Signed)
Rx filled

## 2020-08-01 NOTE — Telephone Encounter (Signed)
MEDICATION: VICTOZA 18 MG/3ML SOPN  PHARMACY: [facility]  Comments:   **Let patient know to contact pharmacy at the end of the day to make sure medication is ready. **  ** Please notify patient to allow 48-72 hours to process**  **Encourage patient to contact the pharmacy for refills or they can request refills through [facility]**

## 2020-08-04 ENCOUNTER — Ambulatory Visit: Payer: Medicare PPO | Admitting: Family Medicine

## 2020-08-05 ENCOUNTER — Other Ambulatory Visit: Payer: Self-pay | Admitting: Family Medicine

## 2020-08-07 NOTE — Progress Notes (Signed)
Phone [phone] In person visit   Subjective:   [name] is a 80 y.o. year old very pleasant female patient who presents for/with See problem oriented charting Chief Complaint  Patient presents with  . Hypertension  . Diabetes   This visit occurred during the SARS-CoV-2 public health emergency.  Safety protocols were in place, including screening questions prior to the visit, additional usage of staff PPE, and extensive cleaning of exam room while observing appropriate contact time as indicated for disinfecting solutions.   Past Medical History-  Patient Active Problem List   Diagnosis Date Noted  . 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    Priority: High  . Bronchiectasis ([place]) 10/18/2015    Priority: High  . Diabetes mellitus type II, controlled ([place]) 12/28/2006    Priority: High  . Adenomatous polyp 12/20/2018    Priority: Medium  . Personal history of colonic polyps 05/18/2017    Priority: Medium  . Aortic atherosclerosis ([place]) 02/16/2016    Priority: Medium  . Solitary pulmonary nodule 01/08/2016    Priority: Medium  . IBS (irritable bowel syndrome) 07/17/2014    Priority: Medium  . Recurrent major depression in partial remission ([place]) 01/03/2014    Priority: Medium  . Hypertension associated with diabetes ([place]) 01/03/2014    Priority: Medium  . Primary open-angle glaucoma 08/02/2012    Priority: Medium  . Fatty liver 03/18/2009    Priority: Medium  . Hyperlipidemia associated with type 2 diabetes mellitus ([place]) 07/26/2007    Priority: Medium  . ANEMIA, B12 DEFICIENCY 12/29/2006    Priority: Medium  . RESTLESS LEG SYNDROME, MILD 12/28/2006    Priority: Medium  . Osteoporosis 12/12/2006    Priority: Medium  . History of total knee replacement, bilateral 11/14/2017    Priority: Low  . Arthritis of midfoot 03/03/2016    Priority: Low  . Hammertoes of both feet 03/03/2016    Priority: Low  . Posterior tibial tendon dysfunction 01/03/2014    Priority: Low  .  Osteoarthritis, knee 01/03/2014    Priority: Low  . Glaucoma 01/03/2014    Priority: Low  . Obesity (BMI 30-39.9) 06/15/2013    Priority: Low  . Foot tendinitis 07/20/2010    Priority: Low  . INSOMNIA, CHRONIC 07/25/2009    Priority: Low  . GERD 12/25/2007    Priority: Low  . NEUROPATHY, IDIOPATHIC PERIPHERAL NEC 12/28/2006    Priority: Low  . BREAST CANCER, HX OF 12/12/2006    Priority: Low  . 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 11/22/2019  . Healthcare maintenance 02/22/2019  . Physical deconditioning 02/22/2019  . Shortness of breath 02/22/2019  . Pain due to onychomycosis of toenails of both feet 11/08/2018  . Diabetic neuropathy ([place]) 11/08/2018    Medications- reviewed and updated Current Outpatient Medications  Medication Sig Dispense Refill  . ACCU-CHEK GUIDE test strip USE 1 TO TEST UP TO TWICE DAILY 200 each 0  . amoxicillin-clavulanate (AUGMENTIN) 875-125 MG tablet Take 1 tablet by mouth 2 (two) times daily. 28 tablet 0  . atenolol (TENORMIN) 25 MG tablet Take 1 tablet by mouth once daily 90 tablet 0  . Bacillus Coagulans-Inulin (ALIGN PREBIOTIC-PROBIOTIC PO) Take 1 capsule by mouth daily.     . blood glucose meter kit and supplies KIT Dispense based on patient and insurance preference. Use up to four times daily as directed. Dx E11.9 1 each 0  . cetirizine (ZYRTEC) 10 MG tablet Take 10 mg by mouth every morning.     . Cholecalciferol (VITAMIN  D3) 125 MCG (5000 UT) TABS Take 5,000 Units by mouth daily.    . Coenzyme Q10 (COQ10) 200 MG CAPS Take 200 mg by mouth daily.    . colesevelam (WELCHOL) 625 MG tablet TAKE 1 TABLET BY MOUTH ONCE DAILY IN THE MORNING AND AT NOON AND AT BEDTIME 90 tablet 3  . dicyclomine (BENTYL) 20 MG tablet Take 1 tablet (20 mg total) by mouth in the morning, at noon, in the evening, and at bedtime. 120 tablet 5  . dorzolamide-timolol (COSOPT) 22.3-6.8 MG/ML ophthalmic solution Place 1 drop into both eyes 2 (two) times daily.      [name] ESTRING 2 MG vaginal ring INSERT 1 RING  INTO VAGINA EVERY 3 MONTHS 1 each 4  . FIBER PO Take 3 tablets by mouth every evening.    [name] FLUoxetine (PROZAC) 40 MG capsule Take 1 capsule by mouth once daily 90 capsule 0  . gabapentin (NEURONTIN) 100 MG capsule TAKE 1 CAPSULE BY MOUTH THREE TIMES DAILY AS NEEDED (NEUROPATHIC  PAIN) 90 capsule 0  . glimepiride (AMARYL) 4 MG tablet TAKE 2 TABLETS BY MOUTH ONCE DAILY WITH BREAKFAST 180 tablet 0  . glucose blood (ACCU-CHEK GUIDE) test strip Use to test blood sugars daily. Dx: E11.9 100 each 6  . Lancets Misc. (ACCU-CHEK FASTCLIX LANCET) KIT Use to test blood sugars daily. Dx: E11.9 1 kit 5  . latanoprost (XALATAN) 0.005 % ophthalmic solution Place 1 drop into the right eye at bedtime.     . liraglutide (VICTOZA) 18 MG/3ML SOPN INJECT 1.$RemoveBefor'8MG'YnrFgtSOYUEl$  SUBCUTANEOUSLY ONCE DAILY 9 mL 3  . lisinopril (ZESTRIL) 10 MG tablet Take 1 tablet by mouth once daily 90 tablet 0  . Misc Natural Products (NEURIVA PO) Take by mouth.    . Multiple Vitamin (MULTIVITAMIN WITH MINERALS) TABS tablet Take 1 tablet by mouth daily. Centrum Silver    . pramipexole (MIRAPEX) 0.5 MG tablet TAKE 1 TABLET BY MOUTH AT BEDTIME 90 tablet 0  . Respiratory Therapy Supplies (FLUTTER) DEVI Use as directed 1 each 0  . ROCKLATAN 0.02-0.005 % SOLN Place 1 drop into the left eye at bedtime.     . Selenium 200 MCG CAPS Take 200 mcg by mouth at bedtime.     . SYMBICORT 160-4.5 MCG/ACT inhaler INHALE 2 PUFFS BY MOUTH TWICE DAILY . (Patient taking differently: Inhale 2 puffs into the lungs in the morning and at bedtime.) 10.2 g 11  . temazepam (RESTORIL) 15 MG capsule TAKE 1 CAPSULE BY MOUTH AT BEDTIME AS NEEDED FOR SLEEP 30 capsule 5  . UNABLE TO FIND Fiber therapy    . vitamin B-12 (CYANOCOBALAMIN) 1000 MCG tablet Take 1,000 mcg by mouth daily.    . famotidine (PEPCID) 20 MG tablet Take 1 tablet (20 mg total) by mouth daily as needed for heartburn or indigestion. (Patient not taking: Reported on  08/11/2020)    . fluticasone (FLONASE) 50 MCG/ACT nasal spray Place 2 sprays into both nostrils daily. (Patient not taking: Reported on 08/11/2020) 16 g 11  . lovastatin (MEVACOR) 10 MG tablet Take 1 tablet (10 mg total) by mouth daily. 90 tablet 3   No current facility-administered medications for this visit.     Objective:  BP 126/70   Pulse 89   Temp (!) 97.4 F (36.3 C) (Temporal)   Ht $R'5\' 10"'Pe$  (1.778 m)   Wt 264 lb (119.7 kg)   LMP 05/10/1992 Comment: partial  SpO2 93%   BMI 37.88 kg/m  Gen: NAD, resting comfortably CV: RRR  no murmurs rubs or gallops Lungs: CTAB no crackles, wheeze, rhonchi Abdomen: soft/nontender/nondistended/normal bowel sounds. Ext: no edema Skin: warm, dry Neuro: grossly normal, moves all extremities  Diabetic Foot Exam - Simple   Simple Foot Form Diabetic Foot exam was performed with the following findings: Yes 08/11/2020  1:59 PM  Visual Inspection No deformities, no ulcerations, no other skin breakdown bilaterally: Yes Sensation Testing See comments: Yes Pulse Check Posterior Tibialis and Dorsalis pulse intact bilaterally: Yes Comments Monofilament sensation lacks until mid shin bilaterally         Assessment and Plan   #Recurrent UTIs-has been referred to urology as 06/23/2020 to Dr. Lovenia Kim husband sees him.  I have wondered if possibly related to prior anal abscess.  -later referred to urogynecologist- has visit on 22nd- had to be moved back from doctor's side - stopped antibiotics about a week ago - started developing symptoms again including burning, urgency, odor - be aware of allergies and resistances for rx- options likely very limited  # Diabetes S: Medication: Victoza 1.8 mg, glimepiride 8 mg-3 old A1c was at least under 8 on this regimen at last visit -Yeast issues/concerns with SGLT2 inhibitor as well as UTIs and GI effects on Metformin.  She has wanted to avoid insulin CBGs- reports home blood sugars have been in the upper 100s  to low 200s which is a significant increase Exercise and diet- infections may have caused higher CBGs plus a lot of worry/stress with son back in town plus grandson foster situation tenous -she states she will not start exercising Lab Results  Component Value Date   HGBA1C 7.4 (H) 04/21/2020   HGBA1C 7.5 (H) 01/08/2020   HGBA1C 7.6 (H) 09/24/2019   A/P: Hopefully controlled with A1c at least under 8-treatment options limited-we will continue current medicine unless A1c gets above 8-encouraged healthy eating/regular exercise even if from her wheelchair -she is open to insulin if a1c is above 8- sounds like with sugars up may have to  #hypertension S: medication: Atenolol 25 mg, lisinopril 10 mg BP Readings from Last 3 Encounters:  08/11/20 126/70  07/08/20 140/70  05/23/20 140/68  A/P: Stable. Continue current medications.    #hyperlipidemia/aortic atherosclerosis- incidental finding S: Medication: Lovastatin every other day.  Also on WelChol from GI  Lab Results  Component Value Date   CHOL 159 01/08/2020   HDL 52 01/08/2020   LDLCALC 77 01/08/2020   LDLDIRECT 77.0 02/16/2016   TRIG 199 (H) 01/08/2020   CHOLHDL 3.1 01/08/2020   A/P: Imperfect control with aortic atherosclerosis-LDL goal would be 70 or less-we will advance the lovastatin daily at 10 mg and recheck lipids next visit  # Depression S: Medication:Fluoxetine 40 mg  Therapy: Patient states counseling has not been helpful in the past-I believe has seen Lisa Depression screen [facility] 2/9 08/11/2020 04/21/2020 01/08/2020  Decreased Interest 1 2 3   Down, Depressed, Hopeless 1 2 3   PHQ - 2 Score 2 4 6   Altered sleeping 2 0 0  Tired, decreased energy 3 3 3   Change in appetite 3 0 3  Feeling bad or failure about yourself  0 0 0  Trouble concentrating 0 0 0  Moving slowly or fidgety/restless 0 0 0  Suicidal thoughts 0 0 0  PHQ-9 Score 10 7 12   Difficult doing work/chores Somewhat difficult Somewhat difficult Not difficult at  all  Some recent data might be hidden  A/P: Still with poor control-discussed possible change to Pristiq. Wants to work through UTI issues and  anal fistula first before considering this. She knows if she has thoughts of self harm to contact us immediately   Recommended follow up: Return in about 14 weeks (around 11/17/2020) for physical or sooner if needed. Future Appointments  Date Time Provider [place]  08/29/2020  3:20 PM Jaquita Folds, MD [facility]    Lab/Order associations:   ICD-10-CM   1. Dysuria  R30.0 Urine Culture    POCT Urinalysis Dipstick (Automated)  2. Recurrent major depression in partial remission (HCC) Chronic F33.41   3. Hypertension associated with diabetes ([place]) Chronic E11.59    I15.2   4. Aortic atherosclerosis (HCC) Chronic I70.0   5. Controlled type 2 diabetes mellitus without complication, without long-term current use of insulin (HCC)  E11.9 CBC with Differential/Platelet    Comprehensive metabolic panel    Hemoglobin A1c    Meds ordered this encounter  Medications  . lovastatin (MEVACOR) 10 MG tablet    Sig: Take 1 tablet (10 mg total) by mouth daily.    Dispense:  90 tablet    Refill:  3   Return precautions advised.  [name], MD

## 2020-08-07 NOTE — Patient Instructions (Addendum)
Health Maintenance Due  Topic Date Due  . TETANUS/TDAP - cheaper at pharmacy 12/24/2017  . COVID-19 Vaccine - team please get date and log for patient 12/23/2019  . OPHTHALMOLOGY EXAM - have them send [name] a copy after your exam next week 02/29/2020   Please stop by lab before you go If you have mychart- we will send your results within 3 business days of [name] receiving them.  If you do not have mychart- we will call you about results within 5 business days of [name] receiving them.  *please also note that you will see labs on mychart as soon as they post. I will later go in and write notes on them- will say "notes from Dr. Yong Channel"  We will consider insulin if a1c is above 8 today- keep working on dietary changes and consider exercise to help  Recommended follow up: Return in about 14 weeks (around 11/17/2020) for physical or sooner if needed. Check with front desk to double check and make sure >1 year from last

## 2020-08-11 ENCOUNTER — Other Ambulatory Visit: Payer: Self-pay

## 2020-08-11 ENCOUNTER — Other Ambulatory Visit: Payer: Self-pay | Admitting: Family Medicine

## 2020-08-11 ENCOUNTER — Ambulatory Visit: Payer: Medicare PPO | Admitting: Family Medicine

## 2020-08-11 ENCOUNTER — Encounter: Payer: Self-pay | Admitting: Family Medicine

## 2020-08-11 VITALS — BP 126/70 | HR 89 | Temp 97.4°F | Ht 70.0 in | Wt 264.0 lb

## 2020-08-11 DIAGNOSIS — R300 Dysuria: Secondary | ICD-10-CM | POA: Diagnosis not present

## 2020-08-11 DIAGNOSIS — E1159 Type 2 diabetes mellitus with other circulatory complications: Secondary | ICD-10-CM | POA: Diagnosis not present

## 2020-08-11 DIAGNOSIS — I152 Hypertension secondary to endocrine disorders: Secondary | ICD-10-CM | POA: Diagnosis not present

## 2020-08-11 DIAGNOSIS — F3341 Major depressive disorder, recurrent, in partial remission: Secondary | ICD-10-CM

## 2020-08-11 DIAGNOSIS — E119 Type 2 diabetes mellitus without complications: Secondary | ICD-10-CM | POA: Diagnosis not present

## 2020-08-11 DIAGNOSIS — I700 Atherosclerosis of aorta: Secondary | ICD-10-CM

## 2020-08-11 LAB — POC URINALSYSI DIPSTICK (AUTOMATED)
Bilirubin, UA: NEGATIVE
Glucose, UA: NEGATIVE
Ketones, UA: NEGATIVE
Nitrite, UA: POSITIVE
Protein, UA: POSITIVE — AB
Spec Grav, UA: >=1.03 — AB (ref 1.010–1.025)
Urobilinogen, UA: 0.2 E.U./dL
pH, UA: 5 (ref 5.0–8.0)

## 2020-08-11 LAB — CBC WITH DIFFERENTIAL/PLATELET
Basophils Absolute: 0.1 10*3/uL (ref 0.0–0.1)
Basophils Relative: 0.7 % (ref 0.0–3.0)
Eosinophils Absolute: 0.3 10*3/uL (ref 0.0–0.7)
Eosinophils Relative: 3.8 % (ref 0.0–5.0)
HCT: 39.9 % (ref 36.0–46.0)
Hemoglobin: 13 g/dL (ref 12.0–15.0)
Lymphocytes Relative: 12.3 % (ref 12.0–46.0)
Lymphs Abs: 1.1 10*3/uL (ref 0.7–4.0)
MCHC: 32.5 g/dL (ref 30.0–36.0)
MCV: 91.8 fl (ref 78.0–100.0)
Monocytes Absolute: 0.5 10*3/uL (ref 0.1–1.0)
Monocytes Relative: 6 % (ref 3.0–12.0)
Neutro Abs: 7.1 10*3/uL (ref 1.4–7.7)
Neutrophils Relative %: 77.2 % — ABNORMAL HIGH (ref 43.0–77.0)
Platelets: 250 10*3/uL (ref 150.0–400.0)
RBC: 4.35 Mil/uL (ref 3.87–5.11)
RDW: 14.9 % (ref 11.5–15.5)
WBC: 9.2 10*3/uL (ref 4.0–10.5)

## 2020-08-11 LAB — HEMOGLOBIN A1C: Hgb A1c MFr Bld: 8.3 % — ABNORMAL HIGH (ref 4.6–6.5)

## 2020-08-11 LAB — COMPREHENSIVE METABOLIC PANEL
ALT: 14 U/L (ref 0–35)
AST: 13 U/L (ref 0–37)
Albumin: 3.8 g/dL (ref 3.5–5.2)
Alkaline Phosphatase: 91 U/L (ref 39–117)
BUN: 16 mg/dL (ref 6–23)
CO2: 28 mEq/L (ref 19–32)
Calcium: 9.1 mg/dL (ref 8.4–10.5)
Chloride: 98 mEq/L (ref 96–112)
Creatinine, Ser: 0.59 mg/dL (ref 0.40–1.20)
GFR: 85.02 mL/min (ref >60.00)
Glucose, Bld: 237 mg/dL — ABNORMAL HIGH (ref 70–99)
Potassium: 4.3 mEq/L (ref 3.5–5.1)
Sodium: 136 mEq/L (ref 135–145)
Total Bilirubin: 0.4 mg/dL (ref 0.2–1.2)
Total Protein: 6.2 g/dL (ref 6.0–8.3)

## 2020-08-11 MED ORDER — NOVOFINE PLUS PEN NEEDLE 32G X 4 MM MISC: 1.0000 | Freq: Every day | 5 refills | Status: DC

## 2020-08-11 MED ORDER — INSULIN DEGLUDEC 100 UNIT/ML ~~LOC~~ SOPN: 5.0000 [IU] | PEN_INJECTOR | Freq: Every day | SUBCUTANEOUS | 99 refills | Status: DC

## 2020-08-11 MED ORDER — LOVASTATIN 10 MG PO TABS: 10.0000 mg | ORAL_TABLET | Freq: Every day | ORAL | 3 refills | Status: DC

## 2020-08-11 NOTE — Addendum Note (Signed)
Addended by: Brandy Hale on: 08/11/2020 02:20 PM   Modules accepted: Orders

## 2020-08-12 LAB — URINE CULTURE
MICRO NUMBER:: 11727603
SPECIMEN QUALITY:: ADEQUATE

## 2020-08-13 ENCOUNTER — Encounter: Payer: Self-pay | Admitting: Primary Care

## 2020-08-13 ENCOUNTER — Telehealth: Payer: Self-pay | Admitting: Critical Care Medicine

## 2020-08-13 ENCOUNTER — Encounter: Payer: Self-pay | Admitting: Family Medicine

## 2020-08-13 ENCOUNTER — Ambulatory Visit (INDEPENDENT_AMBULATORY_CARE_PROVIDER_SITE_OTHER): Payer: Medicare PPO | Admitting: Primary Care

## 2020-08-13 DIAGNOSIS — J471 Bronchiectasis with (acute) exacerbation: Secondary | ICD-10-CM

## 2020-08-13 MED ORDER — DOXYCYCLINE HYCLATE 100 MG PO TABS: 100.0000 mg | ORAL_TABLET | Freq: Two times a day (BID) | ORAL | 0 refills | Status: DC

## 2020-08-13 NOTE — Patient Instructions (Addendum)
Sending in rx doxycycline 100mg  BID x 10 days. Continue Symbicort 160 2BID and Flutter valve 10x/hr twice daily. Recommend taking mucinex 600mg  twice a day for congestion, may need hypertonic saline neb if continues having increased sputum production.Needs to establish with new [facility] pulmonary MD in 3 months

## 2020-08-13 NOTE — Telephone Encounter (Signed)
Called and spoke with pt who states that she feels like she might have bronchitis. Pt began having symptoms 3 days ago having hoarseness and then today began to cough up green phlegm. Pt also has complaints of wheezing.  Pt denies any complaints of chest tightness and states that she does not really know if she has any SOB as she is not mobile.  Pt denies any complaints of fever, has not checked temp but does not feel feverish.  Pt is using her Symbicort inhaler twice daily as prescribed and states that she is also using her flutter valve twice a day doing it 10 times each time she uses it.  Stated to pt since she has not been seen since June 2021 when she last saw Dr. Carlis Abbott that we should get her scheduled for a televisit for further recommendations and she verbalized understanding. Pt has been scheduled for a televisit today 4/6 with Beth. Nothing further needed.

## 2020-08-13 NOTE — Progress Notes (Signed)
Virtual Visit via Video Note  I connected with [name] on 08/13/20 at 12:00 PM EDT by a video enabled telemedicine application and verified that I am speaking with the correct person using two identifiers.  Location: Patient: Home Provider: Office   I discussed the limitations of evaluation and management by telemedicine and the availability of in person appointments. The patient expressed understanding and agreed to proceed.  History of Present Illness: 80 year old female, never smoked. PMH significant for bronchiectasis, tracheomalacia, HTN, aortic atherosclerosis, GERD, fatty liver, type 2 diabetes. Former patient of Dr. Carlis Abbott, last seen on 10/25/19. Maintained on Symbicort 160  Previous [facility] pulmonary encounter: 10/25/19 [name] is a 80 year old woman who presents for follow-up of chronic bronchiectasis.  She is accompanied today by her husband.  She continues to use Symbicort twice daily.  She has occasional clear sputum production, mostly has a dry cough.  She has sinus congestion, ear fullness, and recently ear popping that she attributes to seasonal allergies.  She uses cetirizine daily.  She formerly only had sinus symptoms in the fall, not the summer.  Her breathing is at baseline with occasional wheezing.  She has dyspnea on exertion that she attributes to deconditioning.  She does not walk by herself much at home due to fear of falling.  She has a stationary bike that she does not like to use due to a foot deformity.  Her husband is frustrated at her level of inactivity and shortness of breath.  She uses her flutter valve twice daily x 10.  She never smoked.  Her bronchiectasis is likely secondary to frequent episodes of bronchitis as a child.  She has tracheomalacia discovered during CT scan which does not cause significant symptoms.  08/13/2020- interim hx  Patient contacted today for acute televisit. She developed voice hoarseness three days ago and started with a productive  cough with purulent mucus this morning. She has otherwise been doing well. This is her first exacerbation in the last 6-12 months. She is compliant with Symbicort 160 two puffs twice a day and has been using flutter valve 10 times twice a day. She was recently treated for UTI in mid-march with Augmentin     Observations/Objective:  - Able to speak in full sentences: no overt shortness of breath, wheezing or cough  Assessment and Plan:  Bronchiectasis with acute exacerbation: - Chronic bronchiectasis secondary to recurrent bronchitis when she was younger. Patient reports purulent mucus production x 1 day. Associated voice hoarseness. Appears well controlled at baseline, this is her first exacerbation this year. Sending in rx doxycycline 100mg  BID x 10 days. Continue Symbicort 160 2BID and Flutter valve 10x/hr twice daily. Recommend taking mucinex 600mg  twice a day for congestion, may need hypertonic saline neb if continues having increased sputum production.  Follow Up Instructions:   - Needs to establish with new [facility] pulmonary MD in 3 months  I discussed the assessment and treatment plan with the patient. The patient was provided an opportunity to ask questions and all were answered. The patient agreed with the plan and demonstrated an understanding of the instructions.   The patient was advised to call back or seek an in-person evaluation if the symptoms worsen or if the condition fails to improve as anticipated.  I provided 22 minutes of non-face-to-face time during this encounter.   Martyn Ehrich, NP

## 2020-08-14 ENCOUNTER — Encounter: Payer: Self-pay | Admitting: Family Medicine

## 2020-08-15 ENCOUNTER — Other Ambulatory Visit: Payer: Self-pay | Admitting: Family Medicine

## 2020-08-22 ENCOUNTER — Ambulatory Visit: Payer: Medicare PPO | Admitting: Obstetrics and Gynecology

## 2020-08-24 ENCOUNTER — Encounter: Payer: Self-pay | Admitting: Family Medicine

## 2020-08-25 NOTE — Telephone Encounter (Signed)
Pt is following up on this 

## 2020-08-28 ENCOUNTER — Telehealth: Payer: Self-pay

## 2020-08-28 ENCOUNTER — Encounter: Payer: Self-pay | Admitting: Family Medicine

## 2020-08-28 NOTE — Progress Notes (Signed)
[name] New Patient Evaluation and Consultation  Referring Provider: Megan Salon, MD PCP: Marin Olp, MD Date of Service: 08/29/2020  SUBJECTIVE Chief Complaint: New Patient (Initial Visit) (/) - urinary tract issues  History of Present Illness: [name] is a 80 y.o. White or Caucasian female seen in consultation at the request of Dr. Sabra Heck for evaluation of incontinence.    Review of records from Dr Sabra Heck significant for: History of peri-rectal fistula repair in 11/2019 and 02/2020 Has more urinary urgency recently with incontinence Using estring for atrophy, changed every 3 months  Urine cultures:  08/11/20- mixed flora 07/17/20- >100,000 klebsiella, sensitive to cephalosporins 06/24/20- >100, 000 klebsiella, sensitive to augmentin/ cephalosporins 05/13/20- >100,000 klebsiella, 05/05/20- mixed flora 02/21/20- Klebsiella, sensitive to augmentin/ cephalosporins 02/05/20- multiple species present 01/25/20- > 100,000 klebsiella, sensitive to augmentin, cephalosporins, bactrim and fluoroquinolones 01/08/20- >100,000 klebsiella, sensitive to augmentin, cephalosporins, bactrim and fluoroquinolones 07/18/19- insignificant growth  Urinary Symptoms: Leaks urine with cough/ sneeze, laughing, going from sitting to standing, with a full bladder, with urgency and while asleep. With SUI, she "gushes". Urgency she is better able to control.  Leaks "a lot" Pad use: 4- 5 adult diapers per day.   She is bothered by her UI symptoms.  Day time voids 4-5.  Nocturia: 0 times per night to void. Voiding dysfunction: she does not empty her bladder well.  does not use a catheter to empty bladder.  When urinating, she feels a weak stream and difficulty starting urine stream Drinks: diet lemon lipton iced tea, coke zero with meals per day. No water.   UTIs: 8 UTI's in the last year.  Usually has burning with urination.  Reports history of kidney stones  CT renal stone  study  01/2020: IMPRESSION: 1. No acute process demonstrated in the abdomen or pelvis. No evidence of bowel obstruction or inflammation. 2. Nonobstructing stone in the lower pole right kidney. No ureteral stone, hydronephrosis, or hydroureter. 3. Diverticulosis of the sigmoid colon without evidence of diverticulitis. 4. Focal fatty infiltration of the liver. 5. Aortic atherosclerosis. 6. Old appearing anterior compression of L4.  Pelvic Organ Prolapse Symptoms:                  She Denies a feeling of a bulge the vaginal area.  Bowel Symptom: Bowel movements: 2 time(s) per day Stool consistency: soft  Straining: no.  Splinting: no.  Incomplete evacuation: no.  She Denies accidental bowel leakage / fecal incontinence Bowel regimen: fiber Last colonoscopy: Date 2020, Results polyps removed  Sexual Function Sexually active: no.    Pelvic Pain Denies pelvic pain  Past Medical History:  Past Medical History:  Diagnosis Date  . Allergy   . Anemia   . Anxiety   . Arthritis    right knee;injections every 62month   . Asthma    use daily  . Breast cancer ([facility] 1994[place]1995   HX BREAST CANCER/ right brreast and left breast in 1995  . Bronchiectasis ([facility]   . COMPRESSION FRACTURE, LUMBAR VERTEBRAE 08/21/2008   Qualifier: Diagnosis of  By: JArnoldo MoraleMD, JBalinda Quails  . Depression   . Diabetes mellitus    takes Amaryl and Januvia daily  . Difficulty sleeping   . Diverticulitis   . Dyspnea    with activity  . Early cataracts, bilateral   . Eczema   . Endometriosis   . Fatty liver   . Gastritis   . Glaucoma   . Hammer toe   .  History of kidney stones   . Hyperlipidemia associated with type 2 diabetes mellitus ([place]) 07/26/2007   Diet/exercise control.    . Hypertension    takes Atenolol daily  . IBS (irritable bowel syndrome)   . Joint pain   . Neuropathy    BILATERAL FEET AND MID CALF  . Neuropathy due to medical condition (HCC)    bi lat legs/feet  . Open wound of second  toe of left foot in past   at pre-op appt, wound appears clear of infection 1 month post removal of toenail, no obvious exudate present nor any redness,  . Osteomyelitis ([place])    left 2nd toe  . Osteopenia   . Perirectal fistula   . Posterior tibial tendon dysfunction    left foot  . Restless leg syndrome   . Scoliosis   . Severe esophageal dysplasia   . 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 Thyroid disease   . Vitamin D deficiency    takes Vit d daily  . Weakness    uses a walker and wheelchair     Past Surgical History:   Past Surgical History:  Procedure Laterality Date  . ADENOIDECTOMY     at age 39  . ANAL FISTULOTOMY N/A 03/07/2020   Procedure: ANAL FISTULOTOMY WITH MARSUPIALIZATION;  Surgeon: Michael Boston, MD;  [facility];  Service: General;  Laterality: N/A;  . BIOPSY  12/19/2018   Procedure: BIOPSY;  Surgeon: Mauri Pole, MD;  [facility];  Service: Endoscopy;;  . CATARACT EXTRACTION    . CHOLECYSTECTOMY  06/2008  . COLONOSCOPY WITH PROPOFOL N/A 05/17/2017   Procedure: COLONOSCOPY WITH PROPOFOL;  Surgeon: Mauri Pole, MD;  Location: [facility];  Service: Endoscopy;  Laterality: N/A;  PT WILL BE ADMITTED THE DAY BEFORE FOR PREP PER ROBIN KB  . COLONOSCOPY WITH PROPOFOL N/A 12/19/2018   Procedure: COLONOSCOPY WITH PROPOFOL;  Surgeon: Mauri Pole, MD;  Location: [facility];  Service: Endoscopy;  Laterality: N/A;  . DILATION AND CURETTAGE OF UTERUS    . excision on breast     internal infected suture from breast surgery  . excision removed from neck     infected lymph node  . FOOT SURGERY     left foot-cleaning out areas on toes at the wound center-having MRI to determine if osteomyelitis  . HEMORRHOID SURGERY N/A 11/22/2019   Procedure: HEMORRHOIDECTOMY LIGATION , PEXY;  Surgeon: Michael Boston, MD;  Location: [facility];  Service: General;  Laterality:  N/A;  . HYPERBARIC OXYGEN THERAPY     FEET INFECTION  . HYSTEROSCOPY  06/22/11   PMB submucosal myoma  . JOINT REPLACEMENT     left total shoulder  . LAPAROSCOPIC OOPHORECTOMY Right 12/2004   absent LSO  . LAPAROTOMY    . LIGATION OF INTERNAL FISTULA TRACT N/A 11/22/2019   Procedure: REPAIR OF PERIRECTAL FISTULA, ANORECTAL EXAMINATION UNDER ANESTHESIA;  Surgeon: Michael Boston, MD;  Location: [place];  Service: General;  Laterality: N/A;  . MASTECTOMY Bilateral 1994 right, 1995 left  . POLYPECTOMY  12/19/2018   Procedure: POLYPECTOMY;  Surgeon: Mauri Pole, MD;  [facility];  Service: Endoscopy;;  . RECTAL EXAM UNDER ANESTHESIA N/A 03/07/2020   Procedure: ANORECTAL EXAM UNDER ANESTHESIA;  Surgeon: Michael Boston, MD;  Location: [facility];  Service: General;  Laterality: N/A;  . SHOULDER HEMI-ARTHROPLASTY  01/01/2012   Procedure: SHOULDER  HEMI-ARTHROPLASTY;  Surgeon: Roseanne Kaufman, MD;  Location: [place];  Service: Orthopedics;  Laterality: Left;  Left Shoulder Hemi Arthroplasty with Repair and Reconstruction as Necessary   . THYROIDECTOMY     30yr ago. follows endocrine  . TONSILLECTOMY    . TOTAL KNEE ARTHROPLASTY Right 12/30/2014   Procedure: RIGHT TOTAL KNEE ARTHROPLASTY;  Surgeon: FGaynelle Arabian MD;  Location[facility];  Service: Orthopedics;  Laterality: Right;  . TOTAL KNEE ARTHROPLASTY Left 11/29/2016   Procedure: LEFT TOTAL KNEE ARTHROPLASTY;  Surgeon: AGaynelle Arabian MD;  Location: [facility];  Service: Orthopedics;  Laterality: Left;     Past OB/GYN History: OB History  Gravida Para Term Preterm AB Living  0         0  SAB IAB Ectopic Multiple Live Births             Obstetric Comments  1 adopted   Menopausal: Yes, at age 89 Denies vaginal bleeding since menopause   Medications: She has a current medication list which includes the following prescription(s): cephalexin, accu-chek guide, atenolol, bacillus coagulans-inulin,  blood glucose meter kit and supplies, cetirizine, vitamin d3, coq10, colesevelam, dicyclomine, dorzolamide-timolol, doxycycline, estring, fiber, fluoxetine, gabapentin, glimepiride, accu-chek guide, insulin degludec, novofine plus pen needle, accu-chek fastclix lancet, latanoprost, lisinopril, lovastatin, misc natural products, multivitamin with minerals, pramipexole, flutter, rocklatan, selenium, symbicort, temazepam, UNABLE TO FIND, and vitamin b-12.   Allergies: Patient is allergic to nitrofurantoin, levaquin [levofloxacin in d5w], brimonidine, cephalexin, erythromycin ethylsuccinate, and oxycodone.   Social History:  Social History   Tobacco Use  . Smoking status: Never Smoker  . Smokeless tobacco: Never Used  Vaping Use  . Vaping Use: Never used  Substance Use Topics  . Alcohol use: No  . Drug use: No    Relationship status: married She lives with husband.   She is not employed. Regular exercise: No History of abuse: No  Family History:   Family History  Problem Relation Age of Onset  . Arthritis Mother   . COPD Father   . Heart disease Father        MI 861- states he died of lockjaw  . Hypertension Father   . Hyperlipidemia Father   . Pancreatic cancer Paternal Grandfather   . Colon cancer Neg Hx   . Esophageal cancer Neg Hx   . Rectal cancer Neg Hx   . Stomach cancer Neg Hx      Review of Systems: Review of Systems  Constitutional: Negative for fever, malaise/fatigue and weight loss.  Respiratory: Negative for cough, shortness of breath and wheezing.   Cardiovascular: Negative for chest pain, palpitations and leg swelling.  Gastrointestinal: Negative for abdominal pain and blood in stool.  Genitourinary: Negative for dysuria.  Musculoskeletal: Negative for myalgias.  Skin: Negative for rash.  Neurological: Negative for dizziness and headaches.  Endo/Heme/Allergies: Does not bruise/bleed easily.  Psychiatric/Behavioral: Negative for depression. The patient is  not nervous/anxious.      OBJECTIVE Physical Exam: Vitals:   08/29/20 1531  BP: 124/68  Pulse: 91  Weight: 265 lb (120.2 kg)  Height: _0  (1.778 m)    Physical Exam Constitutional:      General: She is not in acute distress. Pulmonary:     Effort: Pulmonary effort is normal.  Abdominal:     General: There is no distension.     Palpations: Abdomen is soft.     Tenderness: There is no abdominal tenderness. There is no rebound.  Musculoskeletal:  General: No swelling. Normal range of motion.  Skin:    General: Skin is warm and dry.     Findings: No rash.  Neurological:     Mental Status: She is alert and oriented to person, place, and time.  Psychiatric:        Mood and Affect: Mood normal.        Behavior: Behavior normal.     GU / Detailed Urogynecologic Evaluation:  Pelvic Exam: Normal external female genitalia; Bartholin's and Skene's glands normal in appearance; urethral meatus normal in appearance, no urethral masses or discharge.   CST: negative  Estring removed for exam then replaced.  Speculum exam reveals normal vaginal mucosa with atrophy. Cervix normal appearance. Uterus normal single, nontender. Adnexa not palpable.     Pelvic floor strength I/V, puborectalis III/V external anal sphincter III/V  Pelvic floor musculature: Right levator non-tender, Right obturator non-tender, Left levator non-tender, Left obturator non-tender  POP-Q:   POP-Q  -2                                            Aa   -2                                           Ba  -8                                              C   3                                            Gh  2.5                                            Pb  11                                            tvl   -3                                            Ap  -3                                            Bp  -10.5                                              D     Rectal Exam:  Normal  sphincter tone, no distal rectocele, enterocoele  not present, no rectal masses  Post-Void Residual (PVR) by Bladder Scan: In order to evaluate bladder emptying, we discussed obtaining a postvoid residual and she agreed to this procedure.  Procedure: The ultrasound unit was placed on the patient's abdomen in the suprapubic region after the patient had voided. A PVR of 26 ml was obtained by bladder scan.  Laboratory Results: POC urine: + glucose, + ketones I visualized the urine specimen, noting the specimen to be clear yellow   Hgb A1c 08/11/20: 8.3  ASSESSMENT AND PLAN [name] is a 80 y.o. with:  1. SUI (stress urinary incontinence, female)   2. Overactive bladder   3. Urinary frequency   4. Recurrent urinary tract infection    1. SUI For treatment of stress urinary incontinence,  non-surgical options include expectant management, weight loss, physical therapy, as well as a pessary. She is not a surgical candidate at this time due to elevated A1c and she is not interested in surgery.  - Interested in trying a pessary, will return for a fitting.   2. OAB We discussed the symptoms of overactive bladder (OAB), which include urinary urgency, urinary frequency, nocturia, with or without urge incontinence.  While we do not know the exact etiology of OAB, several treatment options exist. We discussed management including behavioral therapy (decreasing bladder irritants, urge suppression strategies, timed voids, bladder retraining), physical therapy, medication. Other therapies are available for refractory cases.  - She currently does not regularly drink water. We reviewed the importance of avoiding bladder irritants such as tea, soda, citrus and artifical sweeteners. List provided. She will work on reducing these and increasing water intake.   3. Recurrent UTI For treatment of recurrent urinary tract infections, we discussed management of recurrent UTIs including prophylaxis with a daily low  dose antibiotic, transvaginal estrogen therapy, D-mannose, and cranberry supplements.   - She has had recent urinary tract imaging which showed small non-obstructive stone - Will start daily prophylactic antibiotic. Based on her recent cultures with sensitivities to cephalosporins, will prescribe Keflex 581m once daily for 6 months. She has keflex listed as a sensitivity (had diarrhea), but doesn't specifically remember the reaction. We discussed that a lower dose of the medication has less likelihood of causing a reaction.   Return for pessary fitting. We discussed that e-string replacement and pessary checks can occur simultaneously.   MJaquita Folds MD   Medical Decision Making:  - Reviewed/ ordered a clinical laboratory test - Review and summation of prior records

## 2020-08-28 NOTE — Telephone Encounter (Signed)
[name] is a 80 y.o. female was called and contacted re: New pt Pre appt call to collect history information. -Allergy -Medication -Confirm pharmacy -OB history   Pt was available.Chart was updated. Pt was notified to arrive 15 min early and we will need a urine sample when she arrives. Pt verbalized understanding.

## 2020-08-29 ENCOUNTER — Ambulatory Visit (INDEPENDENT_AMBULATORY_CARE_PROVIDER_SITE_OTHER): Payer: Medicare PPO | Admitting: Obstetrics and Gynecology

## 2020-08-29 ENCOUNTER — Other Ambulatory Visit: Payer: Self-pay

## 2020-08-29 ENCOUNTER — Other Ambulatory Visit: Payer: Self-pay | Admitting: Family Medicine

## 2020-08-29 ENCOUNTER — Encounter: Payer: Self-pay | Admitting: Obstetrics and Gynecology

## 2020-08-29 VITALS — BP 124/68 | HR 91 | Ht 70.0 in | Wt 265.0 lb

## 2020-08-29 DIAGNOSIS — N3281 Overactive bladder: Secondary | ICD-10-CM | POA: Diagnosis not present

## 2020-08-29 DIAGNOSIS — R350 Frequency of micturition: Secondary | ICD-10-CM | POA: Diagnosis not present

## 2020-08-29 DIAGNOSIS — N393 Stress incontinence (female) (male): Secondary | ICD-10-CM

## 2020-08-29 DIAGNOSIS — N390 Urinary tract infection, site not specified: Secondary | ICD-10-CM

## 2020-08-29 LAB — POCT URINALYSIS DIPSTICK
Appearance: ABNORMAL
Bilirubin, UA: NEGATIVE
Blood, UA: NEGATIVE
Glucose, UA: POSITIVE — AB
Ketones, UA: NEGATIVE
Leukocytes, UA: NEGATIVE
Nitrite, UA: NEGATIVE
Protein, UA: NEGATIVE
Spec Grav, UA: >=1.03 — AB (ref 1.010–1.025)
Urobilinogen, UA: 0.2 E.U./dL
pH, UA: 5 (ref 5.0–8.0)

## 2020-08-29 MED ORDER — CEPHALEXIN 500 MG PO CAPS: 500.0000 mg | ORAL_CAPSULE | Freq: Every day | ORAL | 5 refills | Status: DC

## 2020-08-29 NOTE — Patient Instructions (Addendum)
Today we talked about ways to manage bladder urgency such as altering your diet to avoid irritative beverages and foods (bladder diet) as well as attempting to decrease stress and other exacerbating factors.  Ideal amount of water is 60 oz per day.   The Most Bothersome Foods* The Least Bothersome Foods*  Coffee - Regular & Decaf Tea - caffeinated Carbonated beverages - cola, non-colas, diet & caffeine-free Alcohols - Beer, Red Wine, White Wine, Champagne Fruits - Grapefruit, Sea Breeze, Orange, [employer] - Cranberry, Grapefruit, Orange, Pineapple Vegetables - Tomato & Tomato Products Flavor Enhancers - Hot peppers, Spicy foods, Chili, Horseradish, Vinegar, Monosodium glutamate (MSG) Artificial Sweeteners - NutraSweet, Sweet 'N Low, Equal (sweetener), Saccharin Ethnic foods - [place], [place], [place] food [employer] Scripts - low-fat & whole Fruits - Bananas, Blueberries, Honeydew melon, Pears, Raisins, Watermelon Vegetables - Broccoli, Brussels Sprouts, [place], Carrots, Cauliflower, [place], Cucumber, Mushrooms, Peas, Radishes, Squash, Zucchini, White potatoes, Sweet potatoes & yams Poultry - Chicken, Eggs, [place], [employer] - Beef, Programmer, multimedia, Lamb Seafood - Shrimp, [place] fish, Salmon Grains - Oat, Rice Snacks - Pretzels, Popcorn  *Lissa Morales et al. Diet and its role in interstitial cystitis/bladder pain syndrome (IC/BPS) and comorbid conditions. [place] 2012 Jan 11.     Start antibiotic once a day for prevention of urinary tract infections.   For treatment of stress urinary incontinence, which is leakage with physical activity/movement/strainging/coughing, we discussed expectant management versus nonsurgical options versus surgery. Nonsurgical options include weight loss, physical therapy, as well as a pessary.

## 2020-09-01 ENCOUNTER — Other Ambulatory Visit: Payer: Self-pay | Admitting: Obstetrics and Gynecology

## 2020-09-01 DIAGNOSIS — B379 Candidiasis, unspecified: Secondary | ICD-10-CM

## 2020-09-01 MED ORDER — FLUCONAZOLE 150 MG PO TABS: 150.0000 mg | ORAL_TABLET | Freq: Once | ORAL | 5 refills | Status: DC | PRN

## 2020-09-01 NOTE — Progress Notes (Signed)
Patient prone to yeast infections with antibiotics. Prescribed fluconazole 150mg  x1 with refills to take if needed while she is on the antibiotic prophylaxis for her infections.

## 2020-09-02 ENCOUNTER — Other Ambulatory Visit: Payer: Self-pay | Admitting: Family Medicine

## 2020-09-02 ENCOUNTER — Encounter: Payer: Self-pay | Admitting: Family Medicine

## 2020-09-03 ENCOUNTER — Ambulatory Visit: Payer: Medicare PPO | Admitting: Obstetrics and Gynecology

## 2020-09-08 ENCOUNTER — Encounter: Payer: Self-pay | Admitting: Family Medicine

## 2020-09-08 DIAGNOSIS — J040 Acute laryngitis: Secondary | ICD-10-CM | POA: Diagnosis not present

## 2020-09-09 ENCOUNTER — Encounter: Payer: Self-pay | Admitting: Family Medicine

## 2020-09-19 ENCOUNTER — Encounter: Payer: Self-pay | Admitting: Family Medicine

## 2020-09-20 ENCOUNTER — Encounter: Payer: Self-pay | Admitting: Family Medicine

## 2020-09-21 ENCOUNTER — Encounter: Payer: Self-pay | Admitting: Family Medicine

## 2020-09-22 ENCOUNTER — Telehealth: Payer: Self-pay

## 2020-09-22 NOTE — Telephone Encounter (Signed)
Please schedule virtual OR in office.

## 2020-09-22 NOTE — Telephone Encounter (Signed)
Nurse Assessment Nurse: Jimmey Ralph, RN, Lissa Date/Time (Eastern Time): 09/20/2020 10:10:42 AM Confirm and document reason for call. If symptomatic, describe symptoms. ---Caller states: I started insulin on april 11th, I am now having swollen ankles this morning, and headache for days. My BS this morning is 187, and now is 173. I am on 16units for last three days. and my BS isn't going below 180s. Does the patient have any new or worsening symptoms? ---Yes Will a triage be completed? ---Yes Related visit to physician within the last 2 weeks? ---No Does the PT have any chronic conditions? (i.e. diabetes, asthma, this includes High risk factors for pregnancy, etc.) ---Yes List chronic conditions. ---HTN Diabetes Is this a behavioral health or substance abuse call? ---No Guidelines Guideline Title Affirmed Question Affirmed Notes Nurse Date/Time (Eastern Time) Diabetes - High Blood Sugar Blood glucose 70-240 mg/dL (3.9 -13.3 mmol/L) Hammonds, RN, Lissa 09/20/2020 10:13:34 AM Leg Swelling and Edema [1] MILD swelling of both ankles (i.e., Hammonds, RN, Lissa 09/20/2020 10:17:24 AM PLEASE NOTE: All timestamps contained within this report are represented as [place] Standard Time. CONFIDENTIALTY NOTICE: This fax transmission is intended only for the addressee. It contains information that is legally privileged, confidential or otherwise protected from use or disclosure. If you are not the intended recipient, you are strictly prohibited from reviewing, disclosing, copying using or disseminating any of this information or taking any action in reliance on or regarding this information. If you have received this fax in error, please notify us immediately by telephone so that we can arrange for its return to [name]. Phone: [phone], Toll-Free: [phone], Fax: [phone] Page: 2 of 2 Call Id: [id number] Guidelines Guideline Title Affirmed Question Affirmed Notes Nurse Date/Time  Eilene Ghazi Time) pedal edema) AND [2] new-onset or worsening Disp. Time Eilene Ghazi Time) Disposition Final User 09/20/2020 10:16:22 AM Home Care Hammonds, RN, Lissa 09/20/2020 10:20:37 AM SEE PCP WITHIN 3 DAYS Yes Hammonds, RN, Lissa Caller Disagree/Comply Comply Caller Understands Yes PreDisposition Did not know what to do Care Advice Given Per Guideline HOME CARE: * You should be able to treat this at home. HIGH BLOOD SUGAR (HYPERGLYCEMIA): * Symptoms of mildly high blood sugar: Frequent urination (peeing), increased thirst, fatigue, blurred vision. GENERAL DIABETES ADVICE: * Medical check-ups: See your doctor (or NP/PA) regularly. * You become worse * Blood glucose over 300 mg/dL (16.7 mmol/L) two or more times in a row CARE ADVICE given per Diabetes - High Blood Sugar (Adult) guideline. SEE PCP WITHIN 3 DAYS: * You need to be seen within 2 or 3 days. LEG SWELLING OR EDEMA: * Wear comfortable shoes. CALL BACK IF: * You become worse CARE ADVICE given per Leg Swelling and Edema (Adult) guideline. * Elevate your legs or try to lie down one or two times a day for 20 minutes. Referrals REFERRED TO PCP OFFIC

## 2020-09-22 NOTE — Telephone Encounter (Signed)
I called patient to get scheduled for an appointment, but she said she figured out the issue of why her feet were swelling. [name] states she fell asleep on the couch with her feet down and then woke up Saturday with a headache but thinks it was from a 24 hour virus as she is feeling much better and wanted to know if Dr.Hunter still wants to see her.

## 2020-09-22 NOTE — Telephone Encounter (Signed)
Contacted patient see note under mychart message from 5/15.

## 2020-09-23 ENCOUNTER — Encounter: Payer: Self-pay | Admitting: Family Medicine

## 2020-09-25 ENCOUNTER — Other Ambulatory Visit: Payer: Self-pay | Admitting: Family Medicine

## 2020-10-03 ENCOUNTER — Other Ambulatory Visit: Payer: Self-pay | Admitting: Family Medicine

## 2020-10-05 ENCOUNTER — Encounter: Payer: Self-pay | Admitting: Family Medicine

## 2020-10-10 DIAGNOSIS — E1151 Type 2 diabetes mellitus with diabetic peripheral angiopathy without gangrene: Secondary | ICD-10-CM | POA: Diagnosis not present

## 2020-10-10 DIAGNOSIS — L97512 Non-pressure chronic ulcer of other part of right foot with fat layer exposed: Secondary | ICD-10-CM | POA: Diagnosis not present

## 2020-10-10 DIAGNOSIS — I739 Peripheral vascular disease, unspecified: Secondary | ICD-10-CM | POA: Diagnosis not present

## 2020-10-10 DIAGNOSIS — L603 Nail dystrophy: Secondary | ICD-10-CM | POA: Diagnosis not present

## 2020-10-10 DIAGNOSIS — L97521 Non-pressure chronic ulcer of other part of left foot limited to breakdown of skin: Secondary | ICD-10-CM | POA: Diagnosis not present

## 2020-10-15 ENCOUNTER — Encounter: Payer: Self-pay | Admitting: Family Medicine

## 2020-10-17 ENCOUNTER — Encounter: Payer: Self-pay | Admitting: Emergency Medicine

## 2020-10-17 ENCOUNTER — Other Ambulatory Visit: Payer: Self-pay

## 2020-10-17 ENCOUNTER — Ambulatory Visit (INDEPENDENT_AMBULATORY_CARE_PROVIDER_SITE_OTHER): Payer: Medicare PPO | Admitting: Emergency Medicine

## 2020-10-17 DIAGNOSIS — R490 Dysphonia: Secondary | ICD-10-CM | POA: Insufficient documentation

## 2020-10-17 DIAGNOSIS — J479 Bronchiectasis, uncomplicated: Secondary | ICD-10-CM

## 2020-10-17 MED ORDER — VALSARTAN 80 MG PO TABS: 80.0000 mg | ORAL_TABLET | Freq: Every day | ORAL | 3 refills | Status: DC

## 2020-10-17 MED ORDER — PANTOPRAZOLE SODIUM 40 MG PO TBEC: 40.0000 mg | DELAYED_RELEASE_TABLET | Freq: Every day | ORAL | 3 refills | Status: DC

## 2020-10-17 NOTE — Assessment & Plan Note (Addendum)
She has some mild bronchiectasis on CT chest with out much of a mucus burden.  She does not clear secretions every day.  She is using her flutter valve reliably.  Her biggest concern is hoarseness and upper airway irritation.  Plan to continue flutter.  No indication at this time for hypertonic saline, etc. she has mixed disease on pulmonary function testing and her Symbicort may be an upper airway irritants we will try stopping it temporarily.

## 2020-10-17 NOTE — Patient Instructions (Addendum)
We will temporarily stop Symbicort. Stop lisinopril.  Start valsartan 80 mg once daily. Continue your cetirizine (Zyrtec) once daily. Try starting pantoprazole 40 mg once daily.  Take this medication 1 hour around food. Continue your flutter valve Follow Dr. Lamonte Sakai in 1 month or next available

## 2020-10-17 NOTE — Progress Notes (Signed)
Subjective:    Patient ID: [name], female    DOB: 1940/12/08, 80 y.o.   MRN: [id number]  HPI This is a new patient visit for 80 year old woman, never smoker with a history of breast cancer, diabetes, hypertension, hyperlipidemia, endometriosis, gastritis and esophageal dysplasia, asthma which was diagnosed.  She has bronchiectasis with tracheomalacia and has seen Dr. Lake Bells and Dr. Carlis Abbott in our office in the past.  She is evaluated for hoarseness and treated for possible exacerbation of her bronchiectasis with doxycycline in81 early April 2022. Currently managed on Symbicort twice a day. Not on a reflux med.  Zyrtec, never got any improvement with nasal steroids, but her congestion burden is actually not very high.  She is on lisinopril 10 mg daily.   She is dealing with intermittent hoarseness and upper airway irritation.  Her cough is intermittent, not every day, sometimes associated w aspiration - rare.  She uses aflutter bid, not always assoc w mucous clearance.    She saw ENT last month  Pulmonary function testing 12/02/2015 reviewed by me, shows mixed restriction and obstruction by spirometry without a bronchodilator response, normal lung volumes and a decreased diffusion capacity that corrects to the normal range when adjusted for alveolar volume.  Review of Systems As per HPI  Past Medical History:  Diagnosis Date   Allergy    Anemia    Anxiety    Arthritis    right knee;injections every 51months    Asthma    use daily   Breast cancer ([place]) 1994/1995   HX BREAST CANCER/ right brreast and left breast in 1995   Bronchiectasis ([place])    COMPRESSION FRACTURE, LUMBAR VERTEBRAE 08/21/2008   Qualifier: Diagnosis of  By: Arnoldo Morale MD, Balinda Quails    Depression    Diabetes mellitus    takes Amaryl and Januvia daily   Difficulty sleeping    Diverticulitis    Dyspnea    with activity   Early cataracts, bilateral    Eczema    Endometriosis    Fatty liver    Gastritis     Glaucoma    Hammer toe    History of kidney stones    Hyperlipidemia associated with type 2 diabetes mellitus ([place]) 07/26/2007   Diet/exercise control.     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   Thyroid disease    Vitamin D deficiency    takes Vit d daily   Weakness    uses a walker and wheelchair     Family History  Problem Relation Age of Onset   Arthritis Mother    COPD Father    Heart disease Father        MI 108 - states he  died of lockjaw   Hypertension Father    Hyperlipidemia Father    Pancreatic cancer Paternal Grandfather    Colon cancer Neg Hx    Esophageal cancer Neg Hx    Rectal cancer Neg Hx    Stomach cancer Neg Hx      Social History   Socioeconomic History   Marital status: Married    Spouse name: Not on file   Number of children: 1   Years of education: Not on file   Highest education level: Not on file  Occupational History   Occupation: retired  Tobacco Use   Smoking status: Never   Smokeless tobacco: Never  Vaping Use   Vaping Use: Never used  Substance and Sexual Activity   Alcohol use: No   Drug use: No   Sexual activity: Not Currently    Partners: Male    Birth control/protection: Post-menopausal, Surgical    Comment: partial hysterectomy  Other Topics Concern   Not on file  Social History Narrative   Married 39 years in 2015. 1  adopted son. 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, [employer], sewing, decorate   Social Determinants of Radio broadcast assistant Strain: Not on file  Food Insecurity: Not on file  Transportation Needs: Not on file  Physical Activity: Not on file  Stress: Not on file  Social Connections: Not on file  Intimate Partner Violence: Not on file     Allergies  Allergen Reactions   Nitrofurantoin Other (See Comments)    Severe headache HEADACHE   Levaquin [Levofloxacin In D5w] Other (See Comments)    Pain in tendons    Brimonidine Other (See Comments)    Made eyes and surrounding areas RED/ was an eye drop   Cephalexin Diarrhea and Other (See Comments)    Patient can't remember reaction (per chart at [facility] states diarrhea) (tolerates Augmentin fine)   Erythromycin Ethylsuccinate Hives and Diarrhea   Oxycodone Itching     Outpatient Medications Prior to Visit  Medication Sig Dispense Refill   ACCU-CHEK GUIDE test strip USE 1 TO TEST UP TO TWICE DAILY 200 each 0   atenolol (TENORMIN) 25 MG tablet Take 1 tablet by mouth once daily 90 tablet 0   Bacillus Coagulans-Inulin (ALIGN PREBIOTIC-PROBIOTIC PO) Take 1 capsule by mouth daily.      blood glucose meter kit and supplies KIT Dispense based on patient and insurance preference. Use up to four times daily as directed. Dx E11.9 1 each 0   cephALEXin (KEFLEX) 500 MG capsule Take 1 capsule (500 mg total) by mouth daily. 30 capsule 5   cetirizine (ZYRTEC) 10 MG tablet Take 10 mg by mouth every morning.      Cholecalciferol (VITAMIN D3) 125 MCG (5000 UT) TABS Take 5,000 Units by mouth daily.     Coenzyme Q10 (COQ10) 200 MG CAPS Take 200 mg by mouth daily.     colesevelam (WELCHOL) 625 MG tablet TAKE 1 TABLET BY MOUTH ONCE DAILY IN THE MORNING AND AT NOON AND AT BEDTIME 90 tablet 3   dicyclomine (BENTYL) 20 MG tablet Take 1 tablet (20 mg total) by mouth in the morning, at noon, in the evening,  and at bedtime. 120 tablet 5   dorzolamide-timolol (COSOPT) 22.3-6.8 MG/ML ophthalmic solution Place 1 drop into both eyes 2 (two) times daily.      ESTRING 2 MG vaginal ring INSERT 1  RING  INTO VAGINA EVERY 3 MONTHS 1 each 4   FIBER PO Take 3 tablets by mouth every evening.     fluconazole (DIFLUCAN) 150 MG tablet Take 1 tablet (150 mg total) by mouth once as needed for up to 1 dose. 1 tablet 5   FLUoxetine (PROZAC) 40 MG capsule Take 1 capsule by mouth once daily 90 capsule 0   gabapentin (NEURONTIN) 100 MG capsule TAKE 1 CAPSULE BY MOUTH THREE TIMES DAILY AS NEEDED FOR  NEUROPATHIC  PAIN 90 capsule 0   glimepiride (AMARYL) 4 MG tablet TAKE 2 TABLETS BY MOUTH ONCE DAILY WITH BREAKFAST 180 tablet 0   glucose blood (ACCU-CHEK GUIDE) test strip Use to test blood sugars daily. Dx: E11.9 100 each 6   insulin degludec (TRESIBA) 100 UNIT/ML FlexTouch Pen Inject 5-100 Units into the skin daily. 15 mL prn   Insulin Pen Needle (NOVOFINE PLUS PEN NEEDLE) 32G X 4 MM MISC 1 each by Does not apply route daily. 30 each 5   Lancets Misc. (ACCU-CHEK FASTCLIX LANCET) KIT Use to test blood sugars daily. Dx: E11.9 1 kit 5   latanoprost (XALATAN) 0.005 % ophthalmic solution Place 1 drop into the right eye at bedtime.      lisinopril (ZESTRIL) 10 MG tablet Take 1 tablet by mouth once daily 90 tablet 0   lovastatin (MEVACOR) 10 MG tablet Take 1 tablet (10 mg total) by mouth daily. 90 tablet 3   Misc Natural Products (NEURIVA PO) Take by mouth.     Multiple Vitamin (MULTIVITAMIN WITH MINERALS) TABS tablet Take 1 tablet by mouth daily. Centrum Silver     pramipexole (MIRAPEX) 0.5 MG tablet TAKE 1 TABLET BY MOUTH AT BEDTIME 90 tablet 0   Respiratory Therapy Supplies (FLUTTER) DEVI Use as directed 1 each 0   ROCKLATAN 0.02-0.005 % SOLN Place 1 drop into the left eye at bedtime.      Selenium 200 MCG CAPS Take 200 mcg by mouth at bedtime.      SYMBICORT 160-4.5 MCG/ACT inhaler INHALE 2 PUFFS BY MOUTH TWICE DAILY .  (Patient taking differently: Inhale 2 puffs into the lungs in the morning and at bedtime.) 10.2 g 11   temazepam (RESTORIL) 15 MG capsule TAKE 1 CAPSULE BY MOUTH AT BEDTIME AS NEEDED FOR SLEEP 30 capsule 5   UNABLE TO FIND Fiber therapy     vitamin B-12 (CYANOCOBALAMIN) 1000 MCG tablet Take 1,000 mcg by mouth daily.     doxycycline (VIBRA-TABS) 100 MG tablet Take 1 tablet (100 mg total) by mouth 2 (two) times daily. 20 tablet 0   No facility-administered medications prior to visit.         Objective:   Physical Exam  Vitals:   10/17/20 1423  BP: 118/76  Pulse: 80  Temp: (!) 97.4 F (36.3 C)  TempSrc: Temporal  SpO2: 95%  Weight: 245 lb (111.1 kg)  Height: $Remove'5\' 10"'VQdLlLU$  (1.778 m)   Gen: Pleasant, obese woman, in no distress,  normal affect  ENT: No lesions,  mouth clear,  oropharynx clear, no postnasal drip, some hoarse voice  Neck: No JVD, no stridor  Lungs: No use of accessory muscles, few crackles, no wheezing on normal respiration, no wheeze on forced expiration  Cardiovascular: RRR, heart sounds normal, no murmur or gallops, no peripheral edema  Musculoskeletal: No deformities, no cyanosis or clubbing  Neuro: alert, awake, non focal  Skin: Warm, no lesions or rash     Assessment & Plan:   Bronchiectasis ([place])  She has some mild bronchiectasis on CT chest with out much of a mucus burden.  She does not clear secretions every day.  She is using her flutter valve reliably.  Her biggest concern is hoarseness and upper airway irritation.  Plan to continue flutter.  No indication at this time for hypertonic saline, etc. she has mixed disease on pulmonary function testing and her Symbicort may be an upper airway irritants we will try stopping it temporarily.  Hoarseness Likely multifactorial.  I think for now we can try to stop her Symbicort to see if she misses it, see if this was a contributor to upper airway irritation.  Start a PPI given the possibility of silent GERD.   Continue her cetirizine.  She may benefit from nasal steroid in the future although they have not helped her very much in the past.  Change her lisinopril to an ARB.  I will check with her in 1 month to see how her upper airway irritation is doing.   Baltazar Apo, MD, PhD 10/17/2020, 3:01 PM [facility] [phone] or if no answer before 7:00PM call [phone] For any issues after 7:00PM please call eLink [phone]

## 2020-10-17 NOTE — Assessment & Plan Note (Signed)
Likely multifactorial.  I think for now we can try to stop her Symbicort to see if she misses it, see if this was a contributor to upper airway irritation.  Start a PPI given the possibility of silent GERD.  Continue her cetirizine.  She may benefit from nasal steroid in the future although they have not helped her very much in the past.  Change her lisinopril to an ARB.  I will check with her in 1 month to see how her upper airway irritation is doing.

## 2020-10-19 ENCOUNTER — Other Ambulatory Visit: Payer: Self-pay | Admitting: Family Medicine

## 2020-10-20 DIAGNOSIS — E119 Type 2 diabetes mellitus without complications: Secondary | ICD-10-CM | POA: Diagnosis not present

## 2020-10-20 DIAGNOSIS — H401112 Primary open-angle glaucoma, right eye, moderate stage: Secondary | ICD-10-CM | POA: Diagnosis not present

## 2020-10-20 DIAGNOSIS — H401123 Primary open-angle glaucoma, left eye, severe stage: Secondary | ICD-10-CM | POA: Diagnosis not present

## 2020-10-20 DIAGNOSIS — H04123 Dry eye syndrome of bilateral lacrimal glands: Secondary | ICD-10-CM | POA: Diagnosis not present

## 2020-10-20 LAB — HM DIABETES EYE EXAM

## 2020-10-21 ENCOUNTER — Encounter: Payer: Self-pay | Admitting: Family Medicine

## 2020-10-26 ENCOUNTER — Encounter: Payer: Self-pay | Admitting: Family Medicine

## 2020-10-28 NOTE — Telephone Encounter (Signed)
Patient is scheduled for tomorrow.

## 2020-10-29 ENCOUNTER — Other Ambulatory Visit: Payer: Self-pay

## 2020-10-29 ENCOUNTER — Other Ambulatory Visit (INDEPENDENT_AMBULATORY_CARE_PROVIDER_SITE_OTHER): Payer: Medicare PPO

## 2020-10-29 ENCOUNTER — Encounter: Payer: Self-pay | Admitting: Family Medicine

## 2020-10-29 ENCOUNTER — Ambulatory Visit: Payer: Medicare PPO | Admitting: Family Medicine

## 2020-10-29 ENCOUNTER — Ambulatory Visit (HOSPITAL_COMMUNITY)
Admission: RE | Admit: 2020-10-29 | Discharge: 2020-10-29 | Disposition: A | Discharge status: Home / Self Care | Payer: Medicare PPO | Source: Ambulatory Visit | Attending: Cardiovascular Disease | Admitting: Cardiovascular Disease

## 2020-10-29 VITALS — BP 120/58 | HR 81 | Temp 98.1°F | Ht 70.0 in

## 2020-10-29 DIAGNOSIS — R5383 Other fatigue: Secondary | ICD-10-CM

## 2020-10-29 DIAGNOSIS — M7989 Other specified soft tissue disorders: Secondary | ICD-10-CM | POA: Diagnosis not present

## 2020-10-29 DIAGNOSIS — I152 Hypertension secondary to endocrine disorders: Secondary | ICD-10-CM | POA: Diagnosis not present

## 2020-10-29 DIAGNOSIS — E1159 Type 2 diabetes mellitus with other circulatory complications: Secondary | ICD-10-CM

## 2020-10-29 MED ORDER — LISINOPRIL 10 MG PO TABS: 10.0000 mg | ORAL_TABLET | Freq: Every day | ORAL | 3 refills | Status: DC

## 2020-10-29 MED ORDER — RIVAROXABAN (XARELTO) VTE STARTER PACK (15 & 20 MG): ORAL_TABLET | ORAL | 0 refills | Status: DC

## 2020-10-29 NOTE — Progress Notes (Addendum)
Phone [phone] In person visit   Subjective:   [name] is a 80 y.o. year old very pleasant female patient who presents for/with See problem oriented charting Chief Complaint  Patient presents with   Diabetes   Heart Problem    Patient wants to rule out heart failure,     This visit occurred during the SARS-CoV-2 public health emergency.  Safety protocols were in place, including screening questions prior to the visit, additional usage of staff PPE, and extensive cleaning of exam room while observing appropriate contact time as indicated for disinfecting solutions.   Past Medical History-  Patient Active Problem List   Diagnosis Date Noted   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    Priority: High   Bronchiectasis ([place]) 10/18/2015    Priority: High   Diabetes mellitus type II, controlled ([place]) 12/28/2006    Priority: High   Adenomatous polyp 12/20/2018    Priority: Medium   Personal history of colonic polyps 05/18/2017    Priority: Medium   Aortic atherosclerosis ([place]) 02/16/2016    Priority: Medium   Solitary pulmonary nodule 01/08/2016    Priority: Medium   IBS (irritable bowel syndrome) 07/17/2014    Priority: Medium   Recurrent major depression in partial remission ([place]) 01/03/2014    Priority: Medium   Hypertension associated with diabetes ([place]) 01/03/2014    Priority: Medium   Primary open-angle glaucoma 08/02/2012    Priority: Medium   Fatty liver 03/18/2009    Priority: Medium   Hyperlipidemia associated with type 2 diabetes mellitus ([place]) 07/26/2007    Priority: Medium   ANEMIA, B12 DEFICIENCY 12/29/2006    Priority: Medium   RESTLESS LEG SYNDROME, MILD 12/28/2006    Priority: Medium   Osteoporosis 12/12/2006    Priority: Medium   History of total knee replacement, bilateral 11/14/2017    Priority: Low   Arthritis of midfoot 03/03/2016    Priority: Low   Hammertoes of both feet 03/03/2016    Priority: Low   Posterior tibial tendon dysfunction  01/03/2014    Priority: Low   Osteoarthritis, knee 01/03/2014    Priority: Low   Glaucoma 01/03/2014    Priority: Low   Obesity (BMI 30-39.9) 06/15/2013    Priority: Low   Foot tendinitis 07/20/2010    Priority: Low   INSOMNIA, CHRONIC 07/25/2009    Priority: Low   GERD 12/25/2007    Priority: Low   NEUROPATHY, IDIOPATHIC PERIPHERAL NEC 12/28/2006    Priority: [place], HX OF 12/12/2006    Priority: Low   Hoarseness 10/17/2020   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      blood glucose meter kit and supplies KIT Dispense based on patient and insurance preference. Use up to four times daily as directed. Dx E11.9 1 each 0   cephALEXin (KEFLEX) 500 MG capsule Take 1 capsule (500 mg total) by mouth daily. 30 capsule 5   cetirizine (ZYRTEC)  10 MG tablet Take 10 mg by mouth every morning.      Cholecalciferol (VITAMIN D3) 125 MCG (5000 UT) TABS Take 5,000 Units by mouth daily.     Coenzyme Q10 (COQ10) 200 MG CAPS Take 200 mg by mouth daily.     colesevelam (WELCHOL) 625 MG tablet TAKE 1 TABLET BY MOUTH ONCE DAILY IN THE MORNING AND AT NOON AND AT BEDTIME 90 tablet 3   dicyclomine (BENTYL) 20 MG tablet Take 1 tablet (20 mg total) by mouth in the morning, at noon, in the evening, and at bedtime. 120 tablet 5   dorzolamide-timolol (COSOPT) 22.3-6.8 MG/ML ophthalmic solution Place 1 drop into both eyes 2  (two) times daily.      ESTRING 2 MG vaginal ring INSERT 1 RING  INTO VAGINA EVERY 3 MONTHS 1 each 4   FIBER PO Take 3 tablets by mouth every evening.     FLUoxetine (PROZAC) 40 MG capsule Take 1 capsule by mouth once daily 90 capsule 0   gabapentin (NEURONTIN) 100 MG capsule TAKE 1 CAPSULE BY MOUTH THREE TIMES DAILY AS NEEDED FOR  NEUROPATHIC  PAIN. 90 capsule 0   glimepiride (AMARYL) 4 MG tablet TAKE 2 TABLETS BY MOUTH ONCE DAILY WITH BREAKFAST 180 tablet 0   glucose blood (ACCU-CHEK GUIDE) test strip Use to test blood sugars daily. Dx: E11.9 100 each 6   insulin degludec (TRESIBA) 100 UNIT/ML FlexTouch Pen Inject 5-100 Units into the skin daily. 15 mL prn   Insulin Pen Needle (NOVOFINE PLUS PEN NEEDLE) 32G X 4 MM MISC 1 each by Does not apply route daily. 30 each 5   Lancets Misc. (ACCU-CHEK FASTCLIX LANCET) KIT Use to test blood sugars daily. Dx: E11.9 1 kit 5   latanoprost (XALATAN) 0.005 % ophthalmic solution Place 1 drop into the right eye at bedtime.      lisinopril (ZESTRIL) 10 MG tablet Take 1 tablet (10 mg total) by mouth daily. 90 tablet 3   lovastatin (MEVACOR) 10 MG tablet Take 1 tablet (10 mg total) by mouth daily. 90 tablet 3   Misc Natural Products (NEURIVA PO) Take by mouth.     Multiple Vitamin (MULTIVITAMIN WITH MINERALS) TABS tablet Take 1 tablet by mouth daily. Centrum Silver     pantoprazole (PROTONIX) 40 MG tablet Take 1 tablet (40 mg total) by mouth daily. Take 1 hour around food. 30 tablet 3   pramipexole (MIRAPEX) 0.5 MG tablet TAKE 1 TABLET BY MOUTH AT BEDTIME 90 tablet 0   Respiratory Therapy Supplies (FLUTTER) DEVI Use as directed 1 each 0   RIVAROXABAN (XARELTO) VTE STARTER PACK (15 & 20 MG) Follow package directions: Take one 41m tablet by mouth twice a day. On day 22, switch to one 249mtablet once a day. Take with food. Only fill if positive for clot (stat exam today) 51 each 0   ROCKLATAN 0.02-0.005 % SOLN Place 1 drop into the left eye at bedtime.       Selenium 200 MCG CAPS Take 200 mcg by mouth at bedtime.      temazepam (RESTORIL) 15 MG capsule TAKE 1 CAPSULE BY MOUTH AT BEDTIME AS NEEDED FOR SLEEP 30 capsule 5   UNABLE TO FIND Fiber therapy     vitamin B-12 (CYANOCOBALAMIN) 1000 MCG tablet Take 1,000 mcg by mouth daily.     fluconazole (DIFLUCAN) 150 MG tablet Take 1 tablet (150 mg total) by mouth once as needed for up to 1 dose. 1 tablet 5  SYMBICORT 160-4.5 MCG/ACT inhaler INHALE 2 PUFFS BY MOUTH TWICE DAILY . (Patient not taking: Reported on 10/29/2020) 10.2 g 11   No current facility-administered medications for this visit.     Objective:  BP (!) 120/58   Pulse 81   Temp 98.1 F (36.7 C) (Temporal)   Ht _0  (1.778 m)   LMP 05/10/1992 Comment: partial  SpO2 90%   BMI 35.15 kg/m  Gen: NAD, resting comfortably CV: RRR no murmurs rubs or gallops Lungs: CTAB no crackles, wheeze, rhonchi Abdomen: soft/nontender/nondistended/normal bowel sounds. No rebound or guarding.  Ext:1+ edema on the right, none on left. 37 cm on left calf and 40.5 on right 10 cm below tibial plateau (no history of clot Skin: warm, dry     Assessment and Plan    # right leg swelling S: Patient noted swelling of her right ankle and foot starting about 2 or 3 weeks ago.  Denies increased shortness of breath above baseline but does have stable baseline shortness of breath.  Denies calf pain.  She is swelling up into her calf though. A/P: With swelling of right leg only and increase in size of the right Greater than 3.5 cm compared to the left we will get stat duplex of the right leg to rule out blood clot. - consider updating labs cbc, cmp, tsh, bnp if negative. Could also consider echo as patients concern is heart failure (less likely with unilateral swelling)  -addendum scan negative for DVT- will order labs as listed- patient also with some fatigue  # Diabetes S: Medication:Glimepiride 8 mg, Tresiba up to32 units as directed on the 19th- started  yesterday CBGs- 114 this AM Lab Results  Component Value Date   HGBA1C 8.3 (H) 08/11/2020   HGBA1C 7.4 (H) 04/21/2020   HGBA1C 7.5 (H) 01/08/2020  A/P: improved #s today- continue 32 units- let me know if any lows immediately and update me early next week- may adjust depending on #s  #hypertension S: medication: lisinopril 10 mg (was not able to tolerate the valsartan- felt jittery at night, atenolol 25 mg Home readings #s: not checking recently BP Readings from Last 3 Encounters:  10/29/20 (!) 120/58  10/17/20 118/76  08/29/20 124/68  A/P: Stable. Continue current medications.     Recommended follow up: keep august visit Future Appointments  Date Time Provider [place]  10/31/2020 11:45 AM [facility] HEALTH COACH [facility]  11/28/2020  1:30 PM Collene Gobble, MD [facility] None  12/18/2020  1:40 PM Marin Olp, MD [facility]  12/23/2020  3:00 PM Jaquita Folds, MD [facility]    Lab/Order associations:   ICD-10-CM   1. Right leg swelling  M79.89 VAS [name] LOWER EXTREMITY VENOUS (DVT)    CBC With Differential/Platelet    COMPLETE METABOLIC PANEL WITH GFR    TSH    B Nat Peptide    CANCELED: VAS [name] LOWER EXTREMITY VENOUS (DVT)    2. Hypertension associated with diabetes ([place])  E11.59 CBC With Differential/Platelet   H41.9 COMPLETE METABOLIC PANEL WITH GFR    TSH    3. Fatigue, unspecified type  R53.83 CBC With Differential/Platelet    COMPLETE METABOLIC PANEL WITH GFR    TSH      Meds ordered this encounter  Medications   lisinopril (ZESTRIL) 10 MG tablet    Sig: Take 1 tablet (10 mg total) by mouth daily.    Dispense:  90 tablet    Refill:  3   RIVAROXABAN (  XARELTO) VTE STARTER PACK (15 & 20 MG)    Sig: Follow package directions: Take one 66m tablet by mouth twice a day. On day 22, switch to one 241mtablet once a day. Take with food. Only fill if positive for clot (stat exam today)    Dispense:  51 each    Refill:  0    Return  precautions advised.  StGarret ReddishMD

## 2020-10-29 NOTE — Addendum Note (Signed)
Addended by: Marin Olp on: 10/29/2020 02:25 PM   Modules accepted: Orders

## 2020-10-29 NOTE — Patient Instructions (Addendum)
Health Maintenance Due  Topic Date Due   COVID-19 Vaccine (4 - Booster for [employer] series) Will get this scheduled in the fall.  08/23/2020   Team please get stat duplex set up for her for the right leg- today ideally -team please call me if positive for clot. I already sent in [place] but she is not going to pick this up unless positive  continue 32 units- let me know if any lows immediately and update me early next week- may adjust depending on #s  Recommended follow up: keep august visit

## 2020-10-30 ENCOUNTER — Encounter: Payer: Self-pay | Admitting: Family Medicine

## 2020-10-30 DIAGNOSIS — R609 Edema, unspecified: Secondary | ICD-10-CM

## 2020-10-30 DIAGNOSIS — R0600 Dyspnea, unspecified: Secondary | ICD-10-CM

## 2020-10-30 LAB — COMPLETE METABOLIC PANEL WITH GFR
AG Ratio: 1.6 (calc) (ref 1.0–2.5)
ALT: 11 U/L (ref 6–29)
AST: 11 U/L (ref 10–35)
Albumin: 3.6 g/dL (ref 3.6–5.1)
Alkaline phosphatase (APISO): 88 U/L (ref 37–153)
BUN: 13 mg/dL (ref 7–25)
CO2: 29 mmol/L (ref 20–32)
Calcium: 8.9 mg/dL (ref 8.6–10.4)
Chloride: 97 mmol/L — ABNORMAL LOW (ref 98–110)
Creat: 0.68 mg/dL (ref 0.60–0.88)
GFR, Est African American: 96 mL/min/{1.73_m2} (ref >=60)
GFR, Est Non African American: 83 mL/min/{1.73_m2} (ref >=60)
Globulin: 2.2 g/dL (calc) (ref 1.9–3.7)
Glucose, Bld: 323 mg/dL — ABNORMAL HIGH (ref 65–99)
Potassium: 4.2 mmol/L (ref 3.5–5.3)
Sodium: 136 mmol/L (ref 135–146)
Total Bilirubin: 0.4 mg/dL (ref 0.2–1.2)
Total Protein: 5.8 g/dL — ABNORMAL LOW (ref 6.1–8.1)

## 2020-10-30 LAB — CBC WITH DIFFERENTIAL/PLATELET
Absolute Monocytes: 793 cells/uL (ref 200–950)
Basophils Absolute: 41 cells/uL (ref 0–200)
Basophils Relative: 0.4 %
Eosinophils Absolute: 288 cells/uL (ref 15–500)
Eosinophils Relative: 2.8 %
HCT: 40.2 % (ref 35.0–45.0)
Hemoglobin: 13.1 g/dL (ref 11.7–15.5)
Lymphs Abs: 1514 cells/uL (ref 850–3900)
MCH: 30.2 pg (ref 27.0–33.0)
MCHC: 32.6 g/dL (ref 32.0–36.0)
MCV: 92.6 fL (ref 80.0–100.0)
MPV: 10.2 fL (ref 7.5–12.5)
Monocytes Relative: 7.7 %
Neutro Abs: 7663 cells/uL (ref 1500–7800)
Neutrophils Relative %: 74.4 %
Platelets: 234 10*3/uL (ref 140–400)
RBC: 4.34 10*6/uL (ref 3.80–5.10)
RDW: 12.3 % (ref 11.0–15.0)
Total Lymphocyte: 14.7 %
WBC: 10.3 10*3/uL (ref 3.8–10.8)

## 2020-10-30 LAB — BRAIN NATRIURETIC PEPTIDE: Pro B Natriuretic peptide (BNP): 17 pg/mL (ref 0.0–100.0)

## 2020-10-30 LAB — TSH: TSH: 3.8 u[IU]/mL (ref 0.35–4.50)

## 2020-10-31 ENCOUNTER — Ambulatory Visit: Payer: Medicare PPO

## 2020-10-31 ENCOUNTER — Encounter: Payer: Self-pay | Admitting: Family Medicine

## 2020-11-07 ENCOUNTER — Other Ambulatory Visit: Payer: Self-pay | Admitting: Family Medicine

## 2020-11-13 ENCOUNTER — Encounter: Payer: Self-pay | Admitting: Family Medicine

## 2020-11-14 ENCOUNTER — Ambulatory Visit (HOSPITAL_BASED_OUTPATIENT_CLINIC_OR_DEPARTMENT_OTHER): Payer: Medicare PPO | Admitting: Obstetrics & Gynecology

## 2020-11-27 ENCOUNTER — Ambulatory Visit (HOSPITAL_COMMUNITY): Payer: Medicare PPO | Attending: Cardiology

## 2020-11-27 ENCOUNTER — Other Ambulatory Visit: Payer: Self-pay | Admitting: Family Medicine

## 2020-11-27 ENCOUNTER — Other Ambulatory Visit: Payer: Self-pay

## 2020-11-27 DIAGNOSIS — R609 Edema, unspecified: Secondary | ICD-10-CM | POA: Insufficient documentation

## 2020-11-27 DIAGNOSIS — R0600 Dyspnea, unspecified: Secondary | ICD-10-CM | POA: Insufficient documentation

## 2020-11-27 LAB — ECHOCARDIOGRAM COMPLETE
AR max vel: 3.6 cm2
AV Area VTI: 3.31 cm2
AV Area mean vel: 3.46 cm2
AV Mean grad: 4 mmHg
AV Peak grad: 7.6 mmHg
Ao pk vel: 1.38 m/s
Area-P 1/2: 3.12 cm2
S' Lateral: 3.2 cm

## 2020-11-28 ENCOUNTER — Ambulatory Visit: Payer: Medicare PPO | Admitting: Emergency Medicine

## 2020-11-30 ENCOUNTER — Encounter: Payer: Self-pay | Admitting: Family Medicine

## 2020-12-01 ENCOUNTER — Encounter: Payer: Self-pay | Admitting: Family Medicine

## 2020-12-02 ENCOUNTER — Other Ambulatory Visit: Payer: Self-pay | Admitting: Family Medicine

## 2020-12-04 ENCOUNTER — Other Ambulatory Visit: Payer: Self-pay

## 2020-12-04 MED ORDER — GABAPENTIN 100 MG PO CAPS: ORAL_CAPSULE | ORAL | 3 refills | Status: DC

## 2020-12-10 ENCOUNTER — Encounter: Payer: Self-pay | Admitting: Family Medicine

## 2020-12-18 ENCOUNTER — Other Ambulatory Visit: Payer: Self-pay

## 2020-12-18 ENCOUNTER — Other Ambulatory Visit: Payer: Self-pay | Admitting: Family Medicine

## 2020-12-18 ENCOUNTER — Encounter: Payer: Self-pay | Admitting: Family Medicine

## 2020-12-18 ENCOUNTER — Ambulatory Visit (INDEPENDENT_AMBULATORY_CARE_PROVIDER_SITE_OTHER): Payer: Medicare PPO | Admitting: Family Medicine

## 2020-12-18 VITALS — BP 120/70 | HR 71 | Temp 98.7°F | Ht 70.0 in

## 2020-12-18 DIAGNOSIS — K219 Gastro-esophageal reflux disease without esophagitis: Secondary | ICD-10-CM | POA: Diagnosis not present

## 2020-12-18 DIAGNOSIS — I152 Hypertension secondary to endocrine disorders: Secondary | ICD-10-CM

## 2020-12-18 DIAGNOSIS — E1169 Type 2 diabetes mellitus with other specified complication: Secondary | ICD-10-CM

## 2020-12-18 DIAGNOSIS — E1159 Type 2 diabetes mellitus with other circulatory complications: Secondary | ICD-10-CM | POA: Diagnosis not present

## 2020-12-18 DIAGNOSIS — Z0000 Encounter for general adult medical examination without abnormal findings: Secondary | ICD-10-CM

## 2020-12-18 DIAGNOSIS — E785 Hyperlipidemia, unspecified: Secondary | ICD-10-CM | POA: Diagnosis not present

## 2020-12-18 DIAGNOSIS — I700 Atherosclerosis of aorta: Secondary | ICD-10-CM

## 2020-12-18 DIAGNOSIS — E119 Type 2 diabetes mellitus without complications: Secondary | ICD-10-CM | POA: Diagnosis not present

## 2020-12-18 DIAGNOSIS — D518 Other vitamin B12 deficiency anemias: Secondary | ICD-10-CM | POA: Diagnosis not present

## 2020-12-18 LAB — LIPID PANEL
Cholesterol: 125 mg/dL (ref 0–200)
HDL: 43.6 mg/dL (ref >39.00)
LDL Cholesterol: 61 mg/dL (ref 0–99)
NonHDL: 81.48
Total CHOL/HDL Ratio: 3
Triglycerides: 102 mg/dL (ref 0.0–149.0)
VLDL: 20.4 mg/dL (ref 0.0–40.0)

## 2020-12-18 LAB — CBC WITH DIFFERENTIAL/PLATELET
Basophils Absolute: 0 10*3/uL (ref 0.0–0.1)
Basophils Relative: 0.4 % (ref 0.0–3.0)
Eosinophils Absolute: 0.3 10*3/uL (ref 0.0–0.7)
Eosinophils Relative: 3.3 % (ref 0.0–5.0)
HCT: 39.5 % (ref 36.0–46.0)
Hemoglobin: 12.8 g/dL (ref 12.0–15.0)
Lymphocytes Relative: 13.3 % (ref 12.0–46.0)
Lymphs Abs: 1.3 10*3/uL (ref 0.7–4.0)
MCHC: 32.5 g/dL (ref 30.0–36.0)
MCV: 91.3 fl (ref 78.0–100.0)
Monocytes Absolute: 0.7 10*3/uL (ref 0.1–1.0)
Monocytes Relative: 7 % (ref 3.0–12.0)
Neutro Abs: 7.2 10*3/uL (ref 1.4–7.7)
Neutrophils Relative %: 76 % (ref 43.0–77.0)
Platelets: 247 10*3/uL (ref 150.0–400.0)
RBC: 4.33 Mil/uL (ref 3.87–5.11)
RDW: 14.1 % (ref 11.5–15.5)
WBC: 9.5 10*3/uL (ref 4.0–10.5)

## 2020-12-18 LAB — COMPREHENSIVE METABOLIC PANEL
ALT: 11 U/L (ref 0–35)
AST: 12 U/L (ref 0–37)
Albumin: 3.7 g/dL (ref 3.5–5.2)
Alkaline Phosphatase: 79 U/L (ref 39–117)
BUN: 11 mg/dL (ref 6–23)
CO2: 32 mEq/L (ref 19–32)
Calcium: 9.4 mg/dL (ref 8.4–10.5)
Chloride: 100 mEq/L (ref 96–112)
Creatinine, Ser: 0.58 mg/dL (ref 0.40–1.20)
GFR: 85.16 mL/min (ref >60.00)
Glucose, Bld: 139 mg/dL — ABNORMAL HIGH (ref 70–99)
Potassium: 4.4 mEq/L (ref 3.5–5.1)
Sodium: 140 mEq/L (ref 135–145)
Total Bilirubin: 0.4 mg/dL (ref 0.2–1.2)
Total Protein: 6.6 g/dL (ref 6.0–8.3)

## 2020-12-18 LAB — VITAMIN B12: Vitamin B-12: 675 pg/mL (ref 211–911)

## 2020-12-18 LAB — HEMOGLOBIN A1C: Hgb A1c MFr Bld: 8.5 % — ABNORMAL HIGH (ref 4.6–6.5)

## 2020-12-18 MED ORDER — DESVENLAFAXINE SUCCINATE ER 50 MG PO TB24: 50.0000 mg | ORAL_TABLET | Freq: Every day | ORAL | 3 refills | Status: DC

## 2020-12-18 NOTE — Progress Notes (Signed)
Phone [phone]   Subjective:  Patient presents today for their annual physical. Chief complaint-noted.   See problem oriented charting- ROS- full  review of systems was completed and negative except for: fatigue, hoarse, diarrhea, leg swelling on R, dificulty urinationg, seasonal allergies, sad mood  The following were reviewed and entered/updated in epic: Past Medical History:  Diagnosis Date   Allergy    Anemia    Anxiety    Arthritis    right knee;injections every 74months    Asthma    use daily   Breast cancer ([place]) 1994/1995   HX BREAST CANCER/ right brreast and left breast in 1995   Bronchiectasis ([place])    COMPRESSION FRACTURE, LUMBAR VERTEBRAE 08/21/2008   Qualifier: Diagnosis of  By: Arnoldo Morale MD, Balinda Quails    Depression    Diabetes mellitus    takes Amaryl and Januvia daily   Difficulty sleeping    Diverticulitis    Dyspnea    with activity   Early cataracts, bilateral    Eczema    Endometriosis    Fatty liver    Gastritis    Glaucoma    Hammer toe    History of kidney stones    Hyperlipidemia associated with type 2 diabetes mellitus ([place]) 07/26/2007   Diet/exercise control.     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   Thyroid disease    Vitamin D deficiency    takes Vit d daily   Weakness    uses a walker and wheelchair   Patient Active Problem List    Diagnosis Date Noted   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    Priority: High   Bronchiectasis ([place]) 10/18/2015    Priority: High   Diabetes mellitus type II, controlled ([place]) 12/28/2006    Priority: High   Adenomatous polyp 12/20/2018    Priority: Medium   Personal history of colonic polyps 05/18/2017    Priority: Medium   Aortic atherosclerosis ([place]) 02/16/2016    Priority: Medium   Solitary pulmonary nodule 01/08/2016    Priority: Medium   IBS (irritable bowel syndrome) 07/17/2014    Priority: Medium   Recurrent major depression in partial remission ([place]) 01/03/2014    Priority: Medium   Hypertension associated with diabetes ([place]) 01/03/2014    Priority: Medium   Primary open-angle glaucoma 08/02/2012    Priority: Medium   Fatty liver 03/18/2009    Priority: Medium   Hyperlipidemia associated with type 2 diabetes mellitus ([place]) 07/26/2007    Priority: Medium   ANEMIA, B12 DEFICIENCY 12/29/2006    Priority: Medium   RESTLESS LEG SYNDROME, MILD 12/28/2006    Priority: Medium   Osteoporosis 12/12/2006    Priority: Medium   History of total knee replacement, bilateral  11/14/2017    Priority: Low   Arthritis of midfoot 03/03/2016    Priority: Low   Hammertoes of both feet 03/03/2016    Priority: Low   Posterior tibial tendon dysfunction 01/03/2014    Priority: Low   Osteoarthritis, knee 01/03/2014    Priority: Low   Glaucoma 01/03/2014    Priority: Low   Obesity (BMI 30-39.9) 06/15/2013    Priority: Low   Foot tendinitis 07/20/2010    Priority: Low   INSOMNIA, CHRONIC 07/25/2009    Priority: Low   GERD 12/25/2007    Priority: Low   NEUROPATHY, IDIOPATHIC PERIPHERAL NEC 12/28/2006    Priority: Low   BREAST CANCER, HX OF 12/12/2006    Priority: Low   Hoarseness 10/17/2020   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 11/22/2019   Healthcare maintenance 02/22/2019   Physical deconditioning 02/22/2019   Shortness of breath 02/22/2019   Pain due to  onychomycosis of toenails of both feet 11/08/2018   Diabetic neuropathy ([place]) 11/08/2018   Past Surgical History:  Procedure Laterality Date   ADENOIDECTOMY     at age 18   ANAL FISTULOTOMY N/A 03/07/2020   Procedure: ANAL FISTULOTOMY WITH MARSUPIALIZATION;  Surgeon: Michael Boston, MD;  Location: [facility];  Service: General;  Laterality: N/A;   BIOPSY  12/19/2018   Procedure: BIOPSY;  Surgeon: Mauri Pole, MD;  Location: [facility];  Service: Endoscopy;;   CATARACT EXTRACTION     CHOLECYSTECTOMY  06/2008   COLONOSCOPY WITH PROPOFOL N/A 05/17/2017   Procedure: COLONOSCOPY WITH PROPOFOL;  Surgeon: Mauri Pole, MD;  Location: [facility];  Service: Endoscopy;  Laterality: N/A;  PT WILL BE ADMITTED THE DAY BEFORE FOR PREP PER ROBIN KB   COLONOSCOPY WITH PROPOFOL N/A 12/19/2018   Procedure: COLONOSCOPY WITH PROPOFOL;  Surgeon: Mauri Pole, MD;  Location: [facility];  Service: Endoscopy;  Laterality: N/A;   DILATION AND CURETTAGE OF UTERUS     excision on breast     internal infected suture from breast surgery   excision removed from neck     infected lymph node   FOOT SURGERY     left foot-cleaning out areas on toes at the wound center-having MRI to determine if osteomyelitis   HEMORRHOID SURGERY N/A 11/22/2019   Procedure: [place] , PEXY;  Surgeon: Michael Boston, MD;  Location: [facility];  Service: General;  Laterality: N/A;   HYPERBARIC OXYGEN THERAPY     FEET INFECTION   HYSTEROSCOPY  06/22/11   PMB submucosal myoma   JOINT REPLACEMENT     left total shoulder   LAPAROSCOPIC OOPHORECTOMY Right 12/2004   absent LSO   LAPAROTOMY     LIGATION OF INTERNAL FISTULA TRACT N/A 11/22/2019   Procedure: REPAIR OF PERIRECTAL FISTULA, ANORECTAL EXAMINATION UNDER ANESTHESIA;  Surgeon: Michael Boston, MD;  Location: [facility];  Service: General;  Laterality: N/A;   MASTECTOMY Bilateral 1994 right, 1995 left    POLYPECTOMY  12/19/2018   Procedure: POLYPECTOMY;  Surgeon: Mauri Pole, MD;  [facility];  Service: Endoscopy;;   RECTAL EXAM UNDER ANESTHESIA N/A 03/07/2020   Procedure: ANORECTAL EXAM UNDER ANESTHESIA;  Surgeon: Michael Boston, MD;  Location: [facility];  Service: General;  Laterality: N/A;   SHOULDER HEMI-ARTHROPLASTY  01/01/2012   Procedure: SHOULDER HEMI-ARTHROPLASTY;  Surgeon: Roseanne Kaufman, MD;  Location: [facility];  Service: Orthopedics;  Laterality: Left;  Left Shoulder Hemi Arthroplasty with Repair and Reconstruction as Necessary  THYROIDECTOMY     4yrs ago. follows endocrine   TONSILLECTOMY     TOTAL KNEE ARTHROPLASTY Right 12/30/2014   Procedure: RIGHT TOTAL KNEE ARTHROPLASTY;  Surgeon: Gaynelle Arabian, MD;  [facility];  Service: Orthopedics;  Laterality: Right;   TOTAL KNEE ARTHROPLASTY Left 11/29/2016   Procedure: LEFT TOTAL KNEE ARTHROPLASTY;  Surgeon: Gaynelle Arabian, MD;  [facility];  Service: Orthopedics;  Laterality: Left;    Family History  Problem Relation Age of Onset   Arthritis Mother    COPD Father    Heart disease Father        MI 67 - states he died of lockjaw   Hypertension Father    Hyperlipidemia Father    Pancreatic cancer Paternal Grandfather    Colon cancer Neg Hx    Esophageal cancer Neg Hx    Rectal cancer Neg Hx    Stomach cancer Neg Hx     Medications- reviewed and updated Current Outpatient Medications  Medication Sig Dispense Refill   ACCU-CHEK GUIDE test strip USE 1 TO TEST UP TO TWICE DAILY 200 each 0   atenolol (TENORMIN) 25 MG tablet Take 1 tablet by mouth once daily 90 tablet 3   Bacillus Coagulans-Inulin (ALIGN PREBIOTIC-PROBIOTIC PO) Take 1 capsule by mouth daily.      blood glucose meter kit and supplies KIT Dispense based on patient and insurance preference. Use up to four times daily as directed. Dx E11.9 1 each 0   cephALEXin (KEFLEX) 500 MG capsule Take 1 capsule (500 mg total) by mouth  daily. 30 capsule 5   cetirizine (ZYRTEC) 10 MG tablet Take 10 mg by mouth every morning.      Cholecalciferol (VITAMIN D3) 125 MCG (5000 UT) TABS Take 5,000 Units by mouth daily.     Coenzyme Q10 (COQ10) 200 MG CAPS Take 200 mg by mouth daily.     colesevelam (WELCHOL) 625 MG tablet TAKE 1 TABLET BY MOUTH ONCE DAILY IN THE MORNING AND AT NOON AND AT BEDTIME 90 tablet 3   desvenlafaxine (PRISTIQ) 50 MG 24 hr tablet Take 1 tablet (50 mg total) by mouth daily. 90 tablet 3   dicyclomine (BENTYL) 20 MG tablet Take 1 tablet (20 mg total) by mouth in the morning, at noon, in the evening, and at bedtime. 120 tablet 5   dorzolamide-timolol (COSOPT) 22.3-6.8 MG/ML ophthalmic solution Place 1 drop into both eyes 2 (two) times daily.      ESTRING 2 MG vaginal ring INSERT 1 RING  INTO VAGINA EVERY 3 MONTHS 1 each 4   FIBER PO Take 3 tablets by mouth every evening.     fluconazole (DIFLUCAN) 150 MG tablet Take 1 tablet (150 mg total) by mouth once as needed for up to 1 dose. 1 tablet 5   gabapentin (NEURONTIN) 100 MG capsule TAKE 1 CAPSULE BY MOUTH THREE TIMES DAILY AS NEEDED FOR  NEUROPATHIC  PAIN. 270 capsule 3   glimepiride (AMARYL) 4 MG tablet TAKE 2 TABLETS BY MOUTH ONCE DAILY WITH BREAKFAST 180 tablet 0   glucose blood (ACCU-CHEK GUIDE) test strip Use to test blood sugars daily. Dx: E11.9 100 each 6   insulin degludec (TRESIBA) 100 UNIT/ML FlexTouch Pen Inject 5-100 Units into the skin daily. 15 mL prn   Insulin Pen Needle (NOVOFINE PLUS PEN NEEDLE) 32G X 4 MM MISC 1 each by Does not apply route daily. 30 each 5   Lancets Misc. (ACCU-CHEK FASTCLIX LANCET) KIT Use to test blood sugars daily. Dx:  E11.9 1 kit 5   latanoprost (XALATAN) 0.005 % ophthalmic solution Place 1 drop into the right eye at bedtime.      lisinopril (ZESTRIL) 10 MG tablet Take 1 tablet (10 mg total) by mouth daily. 90 tablet 3   lovastatin (MEVACOR) 10 MG tablet Take 1 tablet (10 mg total) by mouth daily. 90 tablet 3   Misc Natural  Products (NEURIVA PO) Take by mouth.     Multiple Vitamin (MULTIVITAMIN WITH MINERALS) TABS tablet Take 1 tablet by mouth daily. Centrum Silver     pantoprazole (PROTONIX) 40 MG tablet Take 1 tablet (40 mg total) by mouth daily. Take 1 hour around food. 30 tablet 3   pramipexole (MIRAPEX) 0.5 MG tablet TAKE 1 TABLET BY MOUTH AT BEDTIME 90 tablet 0   Respiratory Therapy Supplies (FLUTTER) DEVI Use as directed 1 each 0   ROCKLATAN 0.02-0.005 % SOLN Place 1 drop into the left eye at bedtime.      Selenium 200 MCG CAPS Take 200 mcg by mouth at bedtime.      temazepam (RESTORIL) 15 MG capsule TAKE 1 CAPSULE BY MOUTH AT BEDTIME AS NEEDED FOR SLEEP 30 capsule 5   UNABLE TO FIND Fiber therapy     vitamin B-12 (CYANOCOBALAMIN) 1000 MCG tablet Take 1,000 mcg by mouth daily.     SYMBICORT 160-4.5 MCG/ACT inhaler INHALE 2 PUFFS BY MOUTH TWICE DAILY . (Patient not taking: No sig reported) 10.2 g 11   No current facility-administered medications for this visit.    Allergies-reviewed and updated Allergies  Allergen Reactions   Nitrofurantoin Other (See Comments)    Severe headache HEADACHE   Levaquin [Levofloxacin In D5w] Other (See Comments)    Pain in tendons    Brimonidine Other (See Comments)    Made eyes and surrounding areas RED/ was an eye drop   Cephalexin Diarrhea and Other (See Comments)    Patient can't remember reaction (per chart at [facility] states diarrhea) (tolerates Augmentin fine)   Erythromycin Ethylsuccinate Hives and Diarrhea   Oxycodone Itching    Social History   Social History Narrative   Married 39 years in 2015. 1 adopted son. 1 grandkid (57 yo grandson)      Retired from Development worker, international aid at [employer]: fashion, tv shopping, sewing, decorate   Objective  Objective:  BP 120/70   Pulse 71   Temp 98.7 F (37.1 C) (Temporal)   Ht $R'5\' 10"'QO$  (1.778 m)   LMP 05/10/1992 Comment: partial  SpO2 94%   BMI 35.15 kg/m  Gen: NAD, resting comfortably HEENT: Mucous membranes are  moist. Oropharynx normal CV: RRR no murmurs rubs or gallops Lungs: CTAB no crackles, wheeze, rhonchi Abdomen: soft/nontender/nondistended/normal bowel sounds. No rebound or guarding.  Ext: trace edema on left, 1+ on right Skin: warm, dry Neuro: grossly normal, moves all extremities, PERRLA   Assessment and Plan   80 y.o. female presenting for annual physical.  Health Maintenance counseling: 1. Anticipatory guidance: Patient counseled regarding regular dental exams - advised q6 months, eye exams -follows with Dr. Rachael Fee associate and Dr. Edilia Bo at [facility] op[facility]o for Glaucoma,  avoiding smoking and second hand smoke , limiting alcohol to 1 beverage per day- doesn't drink, no illicit drugs .   2. Risk factor reduction:  Advised patient of need for regular exercise and diet rich and fruits and vegetables to reduce risk of heart attack and stroke. Exercise- none at present but encouraged. Diet-unable to stand to weigh  to today- she perceives stability.  Wt Readings from Last 3 Encounters:  10/17/20 245 lb (111.1 kg)  08/29/20 265 lb (120.2 kg)  08/11/20 264 lb (119.7 kg)  3. Immunizations/screenings/ancillary studies- discussed 4th COVID-19 booster-reasonable to get this and Flu vaccination-recommended fall flu shot-we do not have available yet- otherwise up-to-date. Immunization History  Administered Date(s) Administered   Fluad Quad(high Dose 65+) 02/22/2019, 02/20/2020   H1N1 06/05/2008   Influenza Split 02/07/2012   Influenza Whole 03/10/2000, 01/24/2008, 03/10/2009, 01/20/2010   Influenza, High Dose Seasonal PF 02/12/2013, 01/31/2017, 01/19/2018   Influenza,inj,Quad PF,6+ Mos 01/17/2014, 03/27/2015   Influenza,inj,quad, With Preservative 02/07/2017   Influenza-Unspecified 02/09/2016   PFIZER(Purple Top)SARS-COV-2 Vaccination 06/03/2019, 06/25/2019, 04/24/2020   Pneumococcal Conjugate-13 01/17/2014   Pneumococcal Polysaccharide-23 03/10/2000, 05/02/2007, 01/02/2012   Td 05/10/1992,  12/25/2007   Zoster Recombinat (Shingrix) 02/24/2018, 04/26/2018  4. Cervical cancer screening- past age based screening recommendations. Estring with gynecology 5. Breast cancer screening-patient with history of double mastectomy 1995-no chest wall abnormalities reported- sees gyn for chest wall exam  6. Colon cancer screening - 12/19/2018 with no recall due to age 22. Skin cancer screening- follows with Dr. Ubaldo Glassing from dermatology. advised regular sunscreen use. Denies worrisome, changing, or new skin lesions.  8. Birth control/STD check- postmenopausal/monogamous  9. Osteoporosis screening at 76- offered DEXA- osteoporosis history- she declines -Never smoker  Status of chronic or acute concerns   #social update- son was told pancreatitis and type I diabetes= multiple ER visits- that has been hard on him  #Bronchiectasis-follows with pulmonology Dr. Lamonte Sakai, last visit in June and also has tracheomalacia. Patient states off symbicort and was trialed on PPI- not a big help- will return to pulmology  # on cephlex for UTI preventin  # Right Leg Swelling S:Patient noted swelling of her right ankle and foot starting 2-3 weeks prior to 10/29/2020. Denied any calf pain or increased SOB above baseline but did have stable baseline SOB. There was swelling up into her right calf. - we did get stat duplex of the right leg to rule out blood clot which was negative for DVT - considered having an echo as patients concern was heart failure (less likely with unilateral swelling)-with no obvious cause for swelling other than diastolic dysfunction 0n evidence of obstruction proximal to inguinal ligament A/P: persistent but not worsening- may have had prior injury on right   #b12 deficiency- taking b12 daily- update with labs  # Diabetes S: Medication:Glimepiride 8 mg, Tresiba up to 50 units now CBGs- trending down in last 3 days were 139, 140 with 50 units, and 164 most recently Exercise and diet- chooses  not to exercise Lab Results  Component Value Date   HGBA1C 8.3 (H) 08/11/2020   HGBA1C 7.4 (H) 04/21/2020   HGBA1C 7.5 (H) 01/08/2020  A/P: hopefully improved a1c though honestly with how high sugars have been with transition I expect this may be higher short term and next visit should be better -bump insulin to 51 units if sugar is above  120 tomorrow. Let us know if any blood sugars under 80  #hypertension S: medication: Lisinopril 10 mg, atenolol 25 mg - could not tolerate valsartan in the past- felt jittery at night BP Readings from Last 3 Encounters:  12/18/20 120/70  10/29/20 (!) 120/58  10/17/20 118/76  A/P: Stable. Continue current medications.  #hyperlipidemia/ aortic atherosclerosis- incidental finding S: Medication:lovastatin 10 mg daily (no SE) , WelChol per GI Lab Results  Component Value Date   CHOL 159 01/08/2020  HDL 52 01/08/2020   LDLCALC 77 01/08/2020   LDLDIRECT 77.0 02/16/2016   TRIG 199 (H) 01/08/2020   CHOLHDL 3.1 01/08/2020   A/P: With aortic atherosclerosis would prefer LDL to be under 70-update lipid panel today and potentially titrate dose if needed  # GERD S:Medication: pantoprazole 40 mg daily- 1 hour around food  B12 levels related to PPI use: Lab Results  Component Value Date   VITAMINB12 998 (H) 06/18/2019  A/P: did not help her voice so she stopped medicine- denies worsening symptoms- wants to talk to pumonology   # Depression S: Medication:Fluoxetine 40 mg  Therapy: patient stated counseling was not helpful in the past - possibly had seen Lattie Haw - Pristiq was discussed in the past - she opted to hold off due to recurrent UTIs in the past - agreeable to try now Depression screen [facility] 2/9 12/18/2020 08/11/2020 04/21/2020  Decreased Interest 0 1 2  Down, Depressed, Hopeless 3 1 2   PHQ - 2 Score 3 2 4   Altered sleeping 0 2 0  Tired, decreased energy 3 3 3   Change in appetite 0 3 0  Feeling bad or failure about yourself  0 0 0  Trouble  concentrating 0 0 0  Moving slowly or fidgety/restless 0 0 0  Suicidal thoughts 0 0 0  PHQ-9 Score 6 10 7   Difficult doing work/chores Not difficult at all Somewhat difficult Somewhat difficult  Some recent data might be hidden  A/P: depression in only partial remission- struggling with aging process. Stop fluoxetine 40 mgand start pristiq 50 mg- follow up in 2 months for pristiq  #restless legs pramipexole still helpful.   #hoarseness- ongoing issue- PPI not helpful. Has seen ENT- declines further follow up   Recommended follow up: 14 week diabetes follow up Future Appointments  Date Time Provider [place]  12/23/2020  3:00 PM Jaquita Folds, MD [facility]  12/31/2020  1:45 PM Byrum, Rose Fillers, MD [facility] None    Lab/Order associations:NOT fasting   ICD-10-CM   1. Preventative health care  Z00.00     2. Gastroesophageal reflux disease, unspecified whether esophagitis present  K21.9     3. Hyperlipidemia associated with type 2 diabetes mellitus (HCC)  E11.69 CBC with Differential/Platelet   E78.5 Comprehensive metabolic panel    Lipid panel    4. Hypertension associated with diabetes ([place])  E11.59    I15.2     5. Controlled type 2 diabetes mellitus without complication, without long-term current use of insulin (HCC)  E11.9 CBC with Differential/Platelet    Comprehensive metabolic panel    Lipid panel    Hemoglobin A1c    6. ANEMIA, B12 DEFICIENCY  D51.8 B12    7. Aortic atherosclerosis (HCC)  I70.0       Meds ordered this encounter  Medications   desvenlafaxine (PRISTIQ) 50 MG 24 hr tablet    Sig: Take 1 tablet (50 mg total) by mouth daily.    Dispense:  90 tablet    Refill:  3    I,Harris Phan,acting as a scribe for [name], MD.,have documented all relevant documentation on the behalf of [name], MD,as directed by  [name], MD while in the presence of [name], MD.  I, [name], MD, have reviewed all  documentation for this visit. The documentation on 12/18/20 for the exam, diagnosis, procedures, and orders are all accurate and complete.   Return precautions advised.  [name], MD

## 2020-12-18 NOTE — Patient Instructions (Addendum)
Health Maintenance Due  Topic Date Due   COVID-19 Vaccine (4 - Booster for [employer] series) Will get this schedule- reasonable to get COVID-19 vaccination #4. 08/23/2020   INFLUENZA VACCINE Not available in office yet. Please let us know if you get outside of our office 12/08/2020   Please stop by lab before you go If you have mychart- we will send your results within 3 business days of [name] receiving them.  If you do not have mychart- we will call you about results within 5 business days of [name] receiving them.  *please also note that you will see labs on mychart as soon as they post. I will later go in and write notes on them- will say "notes from Dr. Yong Channel"  Increase insulin to 51 units if sugars are above 120 tomorrow. Lets [name] know if you have any blood sugars under 80. Update me on Monday or Tuesday- another 3 days.   I would prefer an LDL under 70 due to your aortic atherosclerosis.  Tomorrow do not take fluoxetine and start Pristiq 50 mg.  I encouraged you to try to see a dentist every 6 months.  I encourage you to try to incorporate some exercise into your daily lifestyle can also help with depression  Recommended follow up: Return in about 14 weeks (around 03/26/2021) for diabetes follow-up. 6-8 week follow up with pristiq change

## 2020-12-22 ENCOUNTER — Telehealth (HOSPITAL_BASED_OUTPATIENT_CLINIC_OR_DEPARTMENT_OTHER): Payer: Self-pay | Admitting: Obstetrics & Gynecology

## 2020-12-22 NOTE — Telephone Encounter (Addendum)
Dr.Miller patient called to day said she needs be seen soon.She said that she suppose come in every 3 months for Vaginal atrophy.Please advise .

## 2020-12-23 ENCOUNTER — Ambulatory Visit: Payer: Medicare PPO | Admitting: Obstetrics and Gynecology

## 2020-12-23 ENCOUNTER — Encounter: Payer: Self-pay | Admitting: Family Medicine

## 2020-12-24 ENCOUNTER — Ambulatory Visit (HOSPITAL_BASED_OUTPATIENT_CLINIC_OR_DEPARTMENT_OTHER): Payer: Medicare PPO | Admitting: Obstetrics & Gynecology

## 2020-12-24 ENCOUNTER — Other Ambulatory Visit: Payer: Self-pay

## 2020-12-24 ENCOUNTER — Encounter (HOSPITAL_BASED_OUTPATIENT_CLINIC_OR_DEPARTMENT_OTHER): Payer: Self-pay | Admitting: Obstetrics & Gynecology

## 2020-12-24 VITALS — BP 153/61 | HR 92 | Ht 72.0 in | Wt 245.0 lb

## 2020-12-24 DIAGNOSIS — N390 Urinary tract infection, site not specified: Secondary | ICD-10-CM

## 2020-12-24 DIAGNOSIS — N952 Postmenopausal atrophic vaginitis: Secondary | ICD-10-CM | POA: Diagnosis not present

## 2020-12-24 MED ORDER — CEPHALEXIN 500 MG PO CAPS: 500.0000 mg | ORAL_CAPSULE | Freq: Every day | ORAL | 5 refills | Status: DC

## 2020-12-26 NOTE — Progress Notes (Signed)
GYNECOLOGY  VISIT  CC:   estring removal  HPI: 80 y.o. G0P0 Married White or Caucasian female here for estring removal and replacement.  Pt did see Dr. Wannetta Sender for incontinence issues.  She was offered a pessary.  Declines this.  Was started on prophylactic antibiotic and she has not had a UTI since.  Very please with this.  Does need RF.  Does not want to have follow up at this time with Dr. Wannetta Sender.  Is having a little bleeding from where prior fistula was repaired.  Wants me to assess this today.  Denies vaginal bleeding.  GYNECOLOGIC HISTORY: Patient's last menstrual period was 05/10/1992. Contraception: PMP  Patient Active Problem List   Diagnosis Date Noted   Hoarseness 10/17/2020   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 11/22/2019   Healthcare maintenance 02/22/2019   Physical deconditioning 02/22/2019   Shortness of breath 02/22/2019   Adenomatous polyp 12/20/2018   Pain due to onychomycosis of toenails of both feet 11/08/2018   Diabetic neuropathy ([place]) 11/08/2018   History of total knee replacement, bilateral 11/14/2017   Personal history of colonic polyps 05/18/2017   Arthritis of midfoot 03/03/2016   Hammertoes of both feet 03/03/2016   Aortic atherosclerosis ([place]) 02/16/2016   Solitary pulmonary nodule 01/08/2016   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   Bronchiectasis ([place]) 10/18/2015   IBS (irritable bowel syndrome) 07/17/2014   Recurrent major depression in partial remission ([place]) 01/03/2014   Posterior tibial tendon dysfunction 01/03/2014   Osteoarthritis, knee 01/03/2014   Glaucoma 01/03/2014   Hypertension associated with diabetes ([place]) 01/03/2014   Obesity (BMI 30-39.9) 06/15/2013   Primary open-angle glaucoma 08/02/2012   Foot tendinitis 07/20/2010   INSOMNIA, CHRONIC 07/25/2009   Fatty liver 03/18/2009   GERD 12/25/2007   Hyperlipidemia associated with type 2 diabetes mellitus ([place]) 07/26/2007   ANEMIA, B12 DEFICIENCY 12/29/2006   Diabetes  mellitus type II, controlled ([place]) 12/28/2006   RESTLESS LEG SYNDROME, MILD 12/28/2006   NEUROPATHY, IDIOPATHIC PERIPHERAL NEC 12/28/2006   Osteoporosis 12/12/2006   BREAST CANCER, HX OF 12/12/2006    Past Medical History:  Diagnosis Date   Allergy    Anemia    Anxiety    Arthritis    right knee;injections every 81month    Asthma    use daily   Breast cancer ([facility] 1994/1995   HX BREAST CANCER/ right brreast and left breast in 1995   Bronchiectasis ([facility]    COMPRESSION FRACTURE, LUMBAR VERTEBRAE 08/21/2008   Qualifier: Diagnosis of  By: JArnoldo MoraleMD, JBalinda Quails   Depression    Diabetes mellitus    takes Amaryl and Januvia daily   Difficulty sleeping    Diverticulitis    Dyspnea    with activity   Early cataracts, bilateral    Eczema    Endometriosis    Fatty liver    Gastritis    Glaucoma    Hammer toe    History of kidney stones    Hyperlipidemia associated with type 2 diabetes mellitus ([facility] 07/26/2007   Diet/exercise control.     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   Thyroid disease    Vitamin D deficiency    takes Vit d daily   Weakness    uses a walker and wheelchair    Past Surgical History:  Procedure Laterality Date   ADENOIDECTOMY     at age 80   ANAL FISTULOTOMY N/A 03/07/2020   Procedure: ANAL FISTULOTOMY WITH MARSUPIALIZATION;  Surgeon: Gross, Steven,  MD;  Location: [facility];  Service: General;  Laterality: N/A;   BIOPSY  12/19/2018   Procedure: BIOPSY;  Surgeon: Nandigam, Kavitha V, MD;  Location: [facility];  Service: Endoscopy;;   CATARACT EXTRACTION     CHOLECYSTECTOMY  06/2008   COLONOSCOPY WITH PROPOFOL N/A 05/17/2017   Procedure: COLONOSCOPY WITH PROPOFOL;  Surgeon: Nandigam, Kavitha V, MD;  Location: [facility];  Service: Endoscopy;  Laterality: N/A;  PT WILL BE ADMITTED THE DAY BEFORE FOR PREP PER ROBIN KB   COLONOSCOPY WITH PROPOFOL N/A 12/19/2018   Procedure: COLONOSCOPY WITH PROPOFOL;  Surgeon: Nandigam, Kavitha V, MD;  Location: [facility];  Service: Endoscopy;  Laterality: N/A;   DILATION AND CURETTAGE OF UTERUS     excision on breast     internal infected suture from breast surgery   excision removed from neck     infected lymph node   FOOT SURGERY     left foot-cleaning out areas on toes at the wound center-having MRI to determine if osteomyelitis   HEMORRHOID SURGERY N/A 11/22/2019   Procedure: HEMORRHOIDECTOMY LIGATION , PEXY;  Surgeon: Gross, Steven, MD;  Location: [facility];  Service: General;  Laterality: N/A;   HYPERBARIC OXYGEN THERAPY     FEET INFECTION   HYSTEROSCOPY  06/22/11   PMB submucosal myoma   JOINT REPLACEMENT     left total shoulder   LAPAROSCOPIC OOPHORECTOMY Right 12/2004   absent LSO   LAPAROTOMY     LIGATION OF INTERNAL FISTULA TRACT N/A 11/22/2019   Procedure: REPAIR OF PERIRECTAL FISTULA, ANORECTAL EXAMINATION UNDER ANESTHESIA;  Surgeon: Gross, Steven, MD;  Location: [facility];  Service: General;  Laterality: N/A;   MASTECTOMY Bilateral 1994 right, 1995 left   POLYPECTOMY  12/19/2018   Procedure: POLYPECTOMY;  Surgeon: Nandigam, Kavitha V, MD;  Location: [facility];  Service: Endoscopy;;   RECTAL EXAM UNDER ANESTHESIA N/A 03/07/2020   Procedure: ANORECTAL EXAM UNDER ANESTHESIA;  Surgeon: Gross, Steven, MD;  Location:  SURGERY  CENTER;  Service: General;  Laterality: N/A;   SHOULDER HEMI-ARTHROPLASTY  01/01/2012   Procedure: SHOULDER HEMI-ARTHROPLASTY;  Surgeon: William Gramig, MD;  Location: [facility];  Service: Orthopedics;  Laterality: Left;  Left Shoulder Hemi Arthroplasty with Repair and Reconstruction as Necessary    THYROIDECTOMY     40yrs ago. follows endocrine   TONSILLECTOMY     TOTAL KNEE ARTHROPLASTY Right 12/30/2014   Procedure: RIGHT TOTAL KNEE ARTHROPLASTY;  Surgeon: Frank Aluisio, MD;  Location: [facility];  Service: Orthopedics;  Laterality: Right;   TOTAL KNEE ARTHROPLASTY Left 11/29/2016   Procedure: LEFT TOTAL KNEE ARTHROPLASTY;  Surgeon: Aluisio, Frank, MD;  Location: [facility];  Service: Orthopedics;  Laterality: Left;      MEDS:   Current Outpatient Medications on File Prior to Visit  Medication Sig Dispense Refill   ACCU-CHEK GUIDE test strip USE 1 TO TEST UP TO TWICE DAILY 200 each 0   atenolol (TENORMIN) 25 MG tablet Take 1 tablet by mouth once daily 90 tablet 3   Bacillus Coagulans-Inulin (ALIGN PREBIOTIC-PROBIOTIC PO) Take 1 capsule by mouth daily.      blood glucose meter kit and supplies KIT Dispense based on patient and insurance preference. Use up to four times daily as directed. Dx E11.9 1 each 0   cetirizine (ZYRTEC) 10 MG tablet Take 10 mg by mouth every morning.      Cholecalciferol (VITAMIN D3) 125 MCG (5000 UT) TABS Take 5,000 Units by mouth daily.     Coenzyme Q10 (COQ10) 200 MG CAPS Take 200 mg by mouth daily.     colesevelam (WELCHOL) 625 MG tablet TAKE 1 TABLET BY MOUTH ONCE DAILY IN THE MORNING AND AT NOON AND AT BEDTIME 90 tablet 3   desvenlafaxine (PRISTIQ) 50 MG 24 hr tablet Take 1 tablet (50 mg total) by mouth daily. 90 tablet 3   dicyclomine (BENTYL) 20 MG tablet Take 1 tablet (20 mg total) by mouth in the morning, at noon, in the evening, and at bedtime. 120 tablet 5   dorzolamide-timolol (COSOPT) 22.3-6.8 MG/ML ophthalmic solution Place 1 drop into both eyes 2 (two) times daily.       ESTRING 2 MG vaginal ring INSERT 1 RING  INTO VAGINA EVERY 3 MONTHS 1 each 4   FIBER PO Take 3 tablets by mouth every evening.     gabapentin (NEURONTIN) 100 MG capsule TAKE 1 CAPSULE BY MOUTH THREE TIMES DAILY AS NEEDED FOR  NEUROPATHIC  PAIN. 270 capsule 3   glimepiride (AMARYL) 4 MG tablet TAKE 2 TABLETS BY MOUTH ONCE DAILY WITH BREAKFAST 180 tablet 0   glucose blood (ACCU-CHEK GUIDE) test strip Use to test blood sugars daily. Dx: E11.9 100 each 6   insulin degludec (TRESIBA) 100 UNIT/ML FlexTouch Pen Inject 5-100 Units into the skin daily. 15 mL prn   Insulin Pen Needle (NOVOFINE PLUS PEN NEEDLE) 32G X 4 MM MISC 1 each by Does not apply route daily. 30 each 5   Lancets Misc. (ACCU-CHEK FASTCLIX LANCET) KIT Use to test blood sugars daily. Dx: E11.9 1 kit 5   latanoprost (XALATAN) 0.005 % ophthalmic solution Place 1 drop into the right eye at bedtime.      lisinopril (ZESTRIL) 10 MG tablet Take 1 tablet (10 mg total) by mouth daily. 90 tablet 3   lovastatin (MEVACOR) 10 MG tablet Take 1 tablet (10 mg total) by mouth daily. 90 tablet 3   Misc Natural Products (NEURIVA PO) Take by mouth.     Multiple Vitamin (MULTIVITAMIN WITH MINERALS) TABS tablet Take 1 tablet by mouth daily. Centrum Silver     pramipexole (MIRAPEX) 0.5 MG tablet TAKE 1 TABLET BY MOUTH AT BEDTIME 90 tablet 0   Respiratory Therapy Supplies (FLUTTER) DEVI Use as directed 1 each 0   ROCKLATAN 0.02-0.005 % SOLN Place 1 drop into the left eye at bedtime.      Selenium 200 MCG CAPS Take 200 mcg by mouth at bedtime.      temazepam (RESTORIL) 15 MG capsule TAKE 1 CAPSULE BY MOUTH AT BEDTIME AS NEEDED FOR SLEEP 30 capsule 5   UNABLE TO FIND Fiber therapy     vitamin B-12 (CYANOCOBALAMIN) 1000 MCG tablet Take 1,000 mcg by mouth daily.       fluconazole (DIFLUCAN) 150 MG tablet Take 1 tablet (150 mg total) by mouth once as needed for up to 1 dose. (Patient not taking: Reported on 12/24/2020) 1 tablet 5   pantoprazole (PROTONIX) 40 MG  tablet Take 1 tablet (40 mg total) by mouth daily. Take 1 hour around food. (Patient not taking: Reported on 12/24/2020) 30 tablet 3   SYMBICORT 160-4.5 MCG/ACT inhaler INHALE 2 PUFFS BY MOUTH TWICE DAILY . (Patient not taking: No sig reported) 10.2 g 11   No current facility-administered medications on file prior to visit.    ALLERGIES: Nitrofurantoin, Levaquin [levofloxacin in d5w], Brimonidine, Cephalexin, Erythromycin ethylsuccinate, and Oxycodone  Family History  Problem Relation Age of Onset   Arthritis Mother    COPD Father    Heart disease Father        MI 83 - states he died of lockjaw   Hypertension Father    Hyperlipidemia Father    Pancreatic cancer Paternal Grandfather    Colon cancer Neg Hx    Esophageal cancer Neg Hx    Rectal cancer Neg Hx    Stomach cancer Neg Hx     SH:  married, non smoker  Review of Systems  Genitourinary:  Negative for pelvic pain, vaginal bleeding and vaginal discharge.   PHYSICAL EXAMINATION:    BP (!) 153/61 (BP Location: Right Arm, Patient Position: Sitting, Cuff Size: Large)   Pulse 92   Ht 6' (1.829 m) Comment: reported  Wt 245 lb (111.1 kg) Comment: reported  LMP 05/10/1992 Comment: partial  BMI 33.23 kg/m     General appearance: alert, cooperative and appears stated age Lymph:  no inguinal LAD noted  Pelvic: External genitalia:  no lesions              Urethra:  normal appearing urethra with no masses, tenderness or lesions              Bartholins and Skenes: normal                 Vagina: normal appearing vagina with normal color and discharge, no lesions              Cervix: no lesions              Bimanual Exam:  Uterus:  normal size, contour, position, consistency, mobility, non-tender              Adnexa: no mass, fullness, tenderness   Anus: small opening where prior fistula repair was done.  Minimal surrounding erythema. Estring removed without difficulty and new one placed.  Chaperone, Octaviano Batty, CMA, was  present for exam.  Assessment/Plan: 1. Vaginal atrophy - will return in 4 months for estring removal and replacement.  Pt desires to continue use of this.  Does not need RF.  2. Recurrent urinary tract infection - cephALEXin (KEFLEX) 500 MG capsule; Take 1 capsule (500 mg total) by mouth daily.  Dispense: 30 capsule; Refill: 5

## 2020-12-29 ENCOUNTER — Other Ambulatory Visit (HOSPITAL_BASED_OUTPATIENT_CLINIC_OR_DEPARTMENT_OTHER): Payer: Self-pay | Admitting: Obstetrics & Gynecology

## 2020-12-29 ENCOUNTER — Encounter (HOSPITAL_BASED_OUTPATIENT_CLINIC_OR_DEPARTMENT_OTHER): Payer: Self-pay

## 2020-12-29 ENCOUNTER — Other Ambulatory Visit (HOSPITAL_BASED_OUTPATIENT_CLINIC_OR_DEPARTMENT_OTHER): Payer: Self-pay | Admitting: *Deleted

## 2020-12-29 MED ORDER — SULFAMETHOXAZOLE-TRIMETHOPRIM 400-80 MG PO TABS: 1.0000 | ORAL_TABLET | Freq: Every day | ORAL | 1 refills | Status: DC

## 2020-12-29 MED ORDER — SULFAMETHOXAZOLE-TRIMETHOPRIM 400-80 MG PO TABS: 1.0000 | ORAL_TABLET | Freq: Once | ORAL | 1 refills | Status: DC

## 2020-12-30 ENCOUNTER — Other Ambulatory Visit: Payer: Self-pay | Admitting: Family Medicine

## 2020-12-30 NOTE — Telephone Encounter (Signed)
Pt requesting refill for Temazepam. Last OV 12/18/2020.

## 2020-12-31 ENCOUNTER — Other Ambulatory Visit: Payer: Self-pay

## 2020-12-31 ENCOUNTER — Ambulatory Visit: Payer: Medicare PPO | Admitting: Emergency Medicine

## 2020-12-31 DIAGNOSIS — R490 Dysphonia: Secondary | ICD-10-CM | POA: Diagnosis not present

## 2020-12-31 DIAGNOSIS — J479 Bronchiectasis, uncomplicated: Secondary | ICD-10-CM | POA: Diagnosis not present

## 2020-12-31 NOTE — Progress Notes (Signed)
Subjective:    Patient ID: [name], female    DOB: 1940/05/13, 80 y.o.   MRN: [id number]  HPI This is a new patient visit for 80 year old woman, never smoker with a history of breast cancer, diabetes, hypertension, hyperlipidemia, endometriosis, gastritis and esophageal dysplasia, asthma which was diagnosed.  She has bronchiectasis with tracheomalacia and has seen Dr. Lake Bells and Dr. Carlis Abbott in our office in the past.  She is evaluated for hoarseness and treated for possible exacerbation of her bronchiectasis with doxycycline in early April 2022. Currently managed on Symbicort twice a day. Not on a reflux med.  Zyrtec, never got any improvement with nasal steroids, but her congestion burden is actually not very high.  She is on lisinopril 10 mg daily.   She is dealing with intermittent hoarseness and upper airway irritation.  Her cough is intermittent, not every day, sometimes associated w aspiration - rare.  She uses aflutter bid, not always assoc w mucous clearance.    She saw ENT last month  Pulmonary function testing 12/02/2015 reviewed by me, shows mixed restriction and obstruction by spirometry without a bronchodilator response, normal lung volumes and a decreased diffusion capacity that corrects to the normal range when adjusted for alveolar volume.   ROV 12/31/20 --follow-up visit for 80 year old woman with a history of bronchiectasis and associated tracheomalacia, probable asthma based on mixed disease on prior PFT.  She has chronic hoarseness and upper airway irritation, often loses her voice.  I tried stopping her Symbicort in June to see if this would help with upper airway irritation.  Also started PPI and changed her lisinopril to valsartan.  She reports today that she couldn't tolerate being off the lisinopril. Went back on it. Did tolerate being off the Symbicort. She is not using any BD currently. She has been on flonase before but not currently, remains on cetrizine. Still  having voice hoarseness, some intermittent cough   Review of Systems As per HPI     Objective:   Physical Exam  Vitals:   12/31/20 1404  BP: 134/76  Pulse: 71  Temp: 98.1 F (36.7 C)  TempSrc: Oral  SpO2: 91%  Height: 6' (1.829 m)   Gen: Pleasant, obese woman, in no distress,  normal affect  ENT: No lesions,  mouth clear,  oropharynx clear, no postnasal drip, some hoarse voice  Neck: No JVD, no stridor  Lungs: No use of accessory muscles, few crackles, no wheezing on normal respiration, no wheeze on forced expiration  Cardiovascular: RRR, heart sounds normal, no murmur or gallops, no peripheral edema  Musculoskeletal: No deformities, no cyanosis or clubbing  Neuro: alert, awake, non focal  Skin: Warm, no lesions or rash     Assessment & Plan:   Hoarseness We will not restart Symbicort at this time. Continue cetirizine as you have been taking it. Try stopping your pantoprazole (Protonix) to see if you miss this medication.  If your throat irritation increases then I would recommend restarting it. Continue your lisinopril as you are taking it.  We may consider another trial of changing it at some point in the future depending on how your symptoms do. Follow with Dr Lamonte Sakai in 6 months or sooner if you have any problems  Bronchiectasis ([place]) Continue your flutter valve as needed to help with mucus clearance.   Baltazar Apo, MD, PhD 01/09/2021, 1:24 PM [facility] [phone] or if no answer before 7:00PM call [phone] For any issues after 7:00PM please call  eLink [phone]

## 2020-12-31 NOTE — Patient Instructions (Addendum)
We will not restart Symbicort at this time. Continue cetirizine as you have been taking it. Try stopping your pantoprazole (Protonix) to see if you miss this medication.  If your throat irritation increases then I would recommend restarting it. Continue your lisinopril as you are taking it.  We may consider another trial of changing it at some point in the future depending on how your symptoms do. Continue your flutter valve as needed to help with mucus clearance. Follow with Dr Lamonte Sakai in 6 months or sooner if you have any problems

## 2021-01-02 ENCOUNTER — Other Ambulatory Visit: Payer: Self-pay | Admitting: Family Medicine

## 2021-01-05 DIAGNOSIS — K603 Anal fistula: Secondary | ICD-10-CM | POA: Diagnosis not present

## 2021-01-05 DIAGNOSIS — Z87440 Personal history of urinary (tract) infections: Secondary | ICD-10-CM | POA: Diagnosis not present

## 2021-01-05 DIAGNOSIS — K580 Irritable bowel syndrome with diarrhea: Secondary | ICD-10-CM | POA: Diagnosis not present

## 2021-01-05 DIAGNOSIS — K629 Disease of anus and rectum, unspecified: Secondary | ICD-10-CM | POA: Diagnosis not present

## 2021-01-05 DIAGNOSIS — N393 Stress incontinence (female) (male): Secondary | ICD-10-CM | POA: Diagnosis not present

## 2021-01-06 ENCOUNTER — Encounter: Payer: Self-pay | Admitting: Family Medicine

## 2021-01-09 ENCOUNTER — Encounter: Payer: Self-pay | Admitting: Emergency Medicine

## 2021-01-09 NOTE — Assessment & Plan Note (Addendum)
We will not restart Symbicort at this time. Continue cetirizine as you have been taking it. Try stopping your pantoprazole (Protonix) to see if you miss this medication.  If your throat irritation increases then I would recommend restarting it. Continue your lisinopril as you are taking it.  We may consider another trial of changing it at some point in the future depending on how your symptoms do. Follow with Dr Lamonte Sakai in 6 months or sooner if you have any problems

## 2021-01-09 NOTE — Assessment & Plan Note (Signed)
Continue your flutter valve as needed to help with mucus clearance.

## 2021-01-13 ENCOUNTER — Telehealth: Payer: Self-pay

## 2021-01-13 ENCOUNTER — Encounter: Payer: Self-pay | Admitting: Family Medicine

## 2021-01-13 DIAGNOSIS — R300 Dysuria: Secondary | ICD-10-CM

## 2021-01-13 NOTE — Telephone Encounter (Signed)
Please schedule a virtual for pt for UTI.

## 2021-01-13 NOTE — Telephone Encounter (Signed)
Patient called in wondering if she can bring in a urine sample as she believes she has another UTI. Declined appt, wanting just a lab appointment.

## 2021-01-13 NOTE — Telephone Encounter (Signed)
See below, ok to order urine without virtual visit?

## 2021-01-13 NOTE — Telephone Encounter (Signed)
Patient's husband states they dont understand why they need an appointment when typically Dr.Hunter will place an order for urine sample without them coming in. Husband states that she is in a wheelchair and is not very mobile.

## 2021-01-13 NOTE — Telephone Encounter (Signed)
Can work in at 28 40 AM for virtual- go ahead and order a UA and culture- would be great to have UA available before visit- she can have these dropped off.   She has a history of UTIs and multiple antibiotic allergies- if we can fit in a visit it is preferable to touch base. My standard is to always do a visit now- allows the best quality of care.

## 2021-01-13 NOTE — Telephone Encounter (Signed)
Pt called back to see if her husband can bring in a urine sample. She stated that she is in a wheel chair and she wants to know if labs can be ordered instead of her having to make an appt. Please Advise.

## 2021-01-14 ENCOUNTER — Encounter: Payer: Self-pay | Admitting: Family Medicine

## 2021-01-14 ENCOUNTER — Telehealth (INDEPENDENT_AMBULATORY_CARE_PROVIDER_SITE_OTHER): Payer: Medicare PPO | Admitting: Family Medicine

## 2021-01-14 ENCOUNTER — Other Ambulatory Visit: Payer: Medicare PPO

## 2021-01-14 DIAGNOSIS — E1159 Type 2 diabetes mellitus with other circulatory complications: Secondary | ICD-10-CM

## 2021-01-14 DIAGNOSIS — R300 Dysuria: Secondary | ICD-10-CM

## 2021-01-14 DIAGNOSIS — F3341 Major depressive disorder, recurrent, in partial remission: Secondary | ICD-10-CM

## 2021-01-14 DIAGNOSIS — Z87440 Personal history of urinary (tract) infections: Secondary | ICD-10-CM | POA: Diagnosis not present

## 2021-01-14 DIAGNOSIS — E119 Type 2 diabetes mellitus without complications: Secondary | ICD-10-CM | POA: Diagnosis not present

## 2021-01-14 DIAGNOSIS — I152 Hypertension secondary to endocrine disorders: Secondary | ICD-10-CM | POA: Diagnosis not present

## 2021-01-14 MED ORDER — AMOXICILLIN-POT CLAVULANATE 875-125 MG PO TABS: 1.0000 | ORAL_TABLET | Freq: Two times a day (BID) | ORAL | 0 refills | Status: AC

## 2021-01-14 MED ORDER — DESVENLAFAXINE SUCCINATE ER 100 MG PO TB24: 100.0000 mg | ORAL_TABLET | Freq: Every day | ORAL | 5 refills | Status: DC

## 2021-01-14 NOTE — Addendum Note (Signed)
Addended by: Clyde Lundborg A on: 01/14/2021 08:42 AM   Modules accepted: Orders

## 2021-01-14 NOTE — Patient Instructions (Addendum)
Health Maintenance Due  Topic Date Due   COVID-19 Vaccine (4 - Booster for [employer] series) Patient states that she will get this scheduled in the near future if you recommend this.  08/23/2020   INFLUENZA VACCINE Will discuss at her next in office visit.  12/08/2020    Recommended follow up: No follow-ups on file.

## 2021-01-14 NOTE — Telephone Encounter (Signed)
See below to schedule virtual for today, I have placed order for urine sample.

## 2021-01-14 NOTE — Telephone Encounter (Signed)
UA and Culture placed.

## 2021-01-14 NOTE — Progress Notes (Signed)
Phone [phone] Virtual visit via Video note   Subjective:  Chief complaint: Chief Complaint  Patient presents with   Urinary incontinence    Pain with urination, patient states that she overall doesn't feel well. Blood sugar was 202 she states that it's been running in the 140's which makes her thinks that she does have a UTI started yesterday.    This visit type was conducted due to national recommendations for restrictions regarding the COVID-19 Pandemic (e.g. social distancing).  This format is felt to be most appropriate for this patient at this time balancing risks to patient and risks to population by having him in for in person visit.  No physical exam was performed (except for noted visual exam or audio findings with Telehealth visits).    Our team/I connected with [name] at 10:00 AM EDT by a video enabled telemedicine application ([email or web address] or caregility through epic) and verified that I am speaking with the correct person using two identifiers.  Location patient: [facility] Location provider: [facility], office Persons participating in the virtual visit:  patient  Our team/I discussed the limitations of evaluation and management by telemedicine and the availability of in person appointments. In light of current covid-19 pandemic, patient also understands that we are trying to protect them by minimizing in office contact if at all possible.  The patient expressed consent for telemedicine visit and agreed to proceed. Patient understands insurance will be billed.   Past Medical History-  Patient Active Problem List   Diagnosis Date Noted   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    Priority: High   Bronchiectasis ([place]) 10/18/2015    Priority: High   Diabetes mellitus type II, controlled ([place]) 12/28/2006    Priority: High   Adenomatous polyp 12/20/2018    Priority: Medium   Personal history of colonic polyps 05/18/2017    Priority: Medium   Aortic atherosclerosis ([place])  02/16/2016    Priority: Medium   Solitary pulmonary nodule 01/08/2016    Priority: Medium   IBS (irritable bowel syndrome) 07/17/2014    Priority: Medium   Recurrent major depression in partial remission ([place]) 01/03/2014    Priority: Medium   Hypertension associated with diabetes ([place]) 01/03/2014    Priority: Medium   Primary open-angle glaucoma 08/02/2012    Priority: Medium   Fatty liver 03/18/2009    Priority: Medium   Hyperlipidemia associated with type 2 diabetes mellitus ([place]) 07/26/2007    Priority: Medium   ANEMIA, B12 DEFICIENCY 12/29/2006    Priority: Medium   RESTLESS LEG SYNDROME, MILD 12/28/2006    Priority: Medium   Osteoporosis 12/12/2006    Priority: Medium   History of total knee replacement, bilateral 11/14/2017    Priority: Low   Arthritis of midfoot 03/03/2016    Priority: Low   Hammertoes of both feet 03/03/2016    Priority: Low   Posterior tibial tendon dysfunction 01/03/2014    Priority: Low   Osteoarthritis, knee 01/03/2014    Priority: Low   Glaucoma 01/03/2014    Priority: Low   Obesity (BMI 30-39.9) 06/15/2013    Priority: Low   Foot tendinitis 07/20/2010    Priority: Low   INSOMNIA, CHRONIC 07/25/2009    Priority: Low   GERD 12/25/2007    Priority: Low   NEUROPATHY, IDIOPATHIC PERIPHERAL NEC 12/28/2006    Priority: Low   BREAST CANCER, HX OF 12/12/2006    Priority: Low   Hoarseness 10/17/2020   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 11/22/2019  Healthcare maintenance 02/22/2019   Physical deconditioning 02/22/2019   Shortness of breath 02/22/2019   Pain due to onychomycosis of toenails of both feet 11/08/2018   Diabetic neuropathy ([place]) 11/08/2018    Medications- reviewed and updated Current Outpatient Medications  Medication Sig Dispense Refill   ACCU-CHEK GUIDE test strip USE 1 TO TEST UP TO TWICE DAILY 200 each 0   atenolol (TENORMIN) 25 MG tablet Take 1 tablet by mouth once daily 90 tablet 3   Bacillus  Coagulans-Inulin (ALIGN PREBIOTIC-PROBIOTIC PO) Take 1 capsule by mouth daily.      blood glucose meter kit and supplies KIT Dispense based on patient and insurance preference. Use up to four times daily as directed. Dx E11.9 1 each 0   cetirizine (ZYRTEC) 10 MG tablet Take 10 mg by mouth every morning.      Cholecalciferol (VITAMIN D3) 125 MCG (5000 UT) TABS Take 5,000 Units by mouth daily.     Coenzyme Q10 (COQ10) 200 MG CAPS Take 200 mg by mouth daily.     colesevelam (WELCHOL) 625 MG tablet TAKE 1 TABLET BY MOUTH ONCE DAILY IN THE MORNING AND AT NOON AND AT BEDTIME 90 tablet 3   desvenlafaxine (PRISTIQ) 50 MG 24 hr tablet Take 1 tablet (50 mg total) by mouth daily. 90 tablet 3   dicyclomine (BENTYL) 20 MG tablet Take 1 tablet (20 mg total) by mouth in the morning, at noon, in the evening, and at bedtime. 120 tablet 5   dorzolamide-timolol (COSOPT) 22.3-6.8 MG/ML ophthalmic solution Place 1 drop into both eyes 2 (two) times daily.      ESTRING 2 MG vaginal ring INSERT 1 RING  INTO VAGINA EVERY 3 MONTHS 1 each 4   FIBER PO Take 3 tablets by mouth every evening.     fluconazole (DIFLUCAN) 150 MG tablet Take 1 tablet (150 mg total) by mouth once as needed for up to 1 dose. 1 tablet 5   gabapentin (NEURONTIN) 100 MG capsule TAKE 1 CAPSULE BY MOUTH THREE TIMES DAILY AS NEEDED FOR  NEUROPATHIC  PAIN. 270 capsule 3   glimepiride (AMARYL) 4 MG tablet TAKE 2 TABLETS BY MOUTH ONCE DAILY WITH BREAKFAST 180 tablet 0   glucose blood (ACCU-CHEK GUIDE) test strip Use to test blood sugars daily. Dx: E11.9 100 each 6   insulin degludec (TRESIBA) 100 UNIT/ML FlexTouch Pen Inject 5-100 Units into the skin daily. 15 mL prn   Insulin Pen Needle (NOVOFINE PLUS PEN NEEDLE) 32G X 4 MM MISC 1 each by Does not apply route daily. 30 each 5   Lancets Misc. (ACCU-CHEK FASTCLIX LANCET) KIT Use to test blood sugars daily. Dx: E11.9 1 kit 5   latanoprost (XALATAN) 0.005 % ophthalmic solution Place 1 drop into the right eye at  bedtime.      lisinopril (ZESTRIL) 10 MG tablet Take 1 tablet (10 mg total) by mouth daily. 90 tablet 3   lovastatin (MEVACOR) 10 MG tablet Take 1 tablet (10 mg total) by mouth daily. 90 tablet 3   Misc Natural Products (NEURIVA PO) Take by mouth.     Multiple Vitamin (MULTIVITAMIN WITH MINERALS) TABS tablet Take 1 tablet by mouth daily. Centrum Silver     pantoprazole (PROTONIX) 40 MG tablet Take 1 tablet (40 mg total) by mouth daily. Take 1 hour around food. 30 tablet 3   pramipexole (MIRAPEX) 0.5 MG tablet TAKE 1 TABLET BY MOUTH AT BEDTIME 90 tablet 0   Respiratory Therapy Supplies (FLUTTER) DEVI Use as directed  1 each 0   ROCKLATAN 0.02-0.005 % SOLN Place 1 drop into the left eye at bedtime.      Selenium 200 MCG CAPS Take 200 mcg by mouth at bedtime.      sulfamethoxazole-trimethoprim (BACTRIM) 400-80 MG tablet Take 1 tablet by mouth daily. 90 tablet 1   SYMBICORT 160-4.5 MCG/ACT inhaler INHALE 2 PUFFS BY MOUTH TWICE DAILY . 10.2 g 11   temazepam (RESTORIL) 15 MG capsule TAKE 1 CAPSULE BY MOUTH AT BEDTIME AS NEEDED FOR SLEEP 30 capsule 0   UNABLE TO FIND Fiber therapy     vitamin B-12 (CYANOCOBALAMIN) 1000 MCG tablet Take 1,000 mcg by mouth daily.     No current facility-administered medications for this visit.     Objective:  no self reported vitals Gen: NAD, resting comfortably Lungs: nonlabored, normal respiratory rate  Skin: appears dry, no obvious rash     Assessment and Plan    #Concern for UTI in patient with history of recurrent UTIs-previously thought related to fistula which has been surgically treated but still having some issues in that area S: Patients symptoms started yesterday.  Complains of dysuria: yes; polyuria: yes; nocturia: yes; urgency: yes plus incontinence.  Symptoms are - She took one keflex yesterday and feels slightly better. Was on prophylaxis with bactrim through Dr. Sabra Heck but also had keflex on hand- took one dose as bactrim didn't seem to be  working  Risk analyst Complaint  Patient presents with   Urinary incontinence    Pain with urination, patient states that she overall doesn't feel well. Blood sugar was 202 she states that it's been running in the 140's which makes her thinks that she does have a UTI started yesterday.     Reviewed urine cultures: 08/11/2020-patient with mixed genital flora not indicative of UTI 07/17/2020-patient with Klebsiella pneumonia 100,000 colonies resistant to ampicillin, ciprofloxacin, levofloxacin, nitrofurantoin, Bactrim.  Multiple allergies and ultimately treated with Augmentin. 06/24/2020-patient with Klebsiella pneumonia 100,000 colonies resistant to ampicillin, ciprofloxacin, levofloxacin, nitrofurantoin, Bactrim.  She was treated with Augmentin and referred to urology 05/13/2020-patient with Klebsiella pneumonia 100,000 colonies with similar resistance pattern-treated with Augmentin 05/05/20 Urine culture negative 03/07/20 fistula surgery with Dr. Johney Maine 02/21/20 Klebsiella pneumoniae 02/05/20 multiple species 01/25/20 klebseilla pneumoniae (only resistant to ampicillin and nitrofurantoin) 01/08/20 klebsiella  Pneumoniae (resisnat to ampicillin and nitrofurantoin)  Also had follow-up with Dr. Johney Maine on 01/05/2021-was told would need exploratory surgery - she does not want to have more surgery right now -keflex issues- irritates IBS -treated by Dr. Sabra Heck with Bactrim- as was not tolerating keflex  ROS- no fever, chills, nausea, vomiting, flank pain. No blood in urine.  A/P: UA will be obtained. Likely UTI. Will get culture- has had one dose of keflex and we have to keep that in mind. Empiric treatment with: augmentin- prior UTIs typically sensitive to this.  She can resume prophylaxis with Bactrim after finishing course as long as symptoms resolve Patient to follow up if new or worsening symptoms or failure to improve.   # Diabetes S: Medication:Glimepiride 8 mg daily, Tresiba up to 58 units now  - a1c  was only 8.5 CBGs- trending down until infection recently Lab Results  Component Value Date   HGBA1C 8.5 (H) 12/18/2020   HGBA1C 8.3 (H) 08/11/2020   HGBA1C 7.4 (H) 04/21/2020    A/P: hopefully improving- will hold off on adjustments until we finish UTI treatment  #hypertension S: medication: Lisinopril 10 mg daily, atenolol 25 mg daily - could not tolerate  valsartan in the past- felt jittery at night Home readings #s: reports reassuring readings recently BP Readings from Last 3 Encounters:  12/31/20 134/76  12/24/20 (!) 153/61  12/18/20 120/70  A/P: well controlled recently- continue current meds  #Depression- no improvement so far on pristiq 50 mg- had follow up planned on 01/27/21- we opted to increase to 194m and have her follow up in 2 months. She can wait on changing until she finishes UTI treatment. No SI- if that occurred she would reach out immediately.    Recommended follow up:  as needed if symptoms worsen or fail to improve Future Appointments  Date Time Provider [place] 01/14/2021 11:15 AM [facility]  01/27/2021  3:40 PM HMarin Olp MD [facility]  02/13/2021  4:00 PM [facility]aqu[facility]olds [facility] 03/30/2021  1:20 PM HMarin Olp MD [facility]  04/13/2021  1:15 PM MMegan Salon MD [facility]    Lab/Order associations:   ICD-10-CM   1. Dysuria  R30.0     2. History of recurrent UTI (urinary tract infection)  Z87.440     3. Controlled type 2 diabetes mellitus without complication, without long-term current use of insulin ([facility])  E11.9     4. Hypertension associated with diabetes ([place]  E11.59    I15.2     5. Recurrent major depression in partial remission ([facility])  F33.41       Meds ordered this encounter  Medications   amoxicillin-clavulanate (AUGMENTIN) 875-125 MG tablet    Sig: Take 1 tablet by mouth 2 (two) times daily for 7 days.    Dispense:  14 tablet    Refill:  0   desvenlafaxine (PRISTIQ) 100 MG 24  hr tablet    Sig: Take 1 tablet (100 mg total) by mouth daily.    Dispense:  30 tablet    Refill:  5   I,Jada Bradford,acting as a scribe for SGarret Reddish MD.,have documented all relevant documentation on the behalf of SGarret Reddish MD,as directed by  SGarret Reddish MD while in the presence of SGarret Reddish MD.  I, SGarret Reddish MD, have reviewed all documentation for this visit. The documentation on 01/14/21 for the exam, diagnosis, procedures, and orders are all accurate and complete.  Return precautions advised.  SGarret Reddish MD

## 2021-01-14 NOTE — Telephone Encounter (Signed)
Patient is scheduled   

## 2021-01-15 LAB — URINE CULTURE
MICRO NUMBER:: 12341361
SPECIMEN QUALITY:: ADEQUATE

## 2021-01-19 DIAGNOSIS — L603 Nail dystrophy: Secondary | ICD-10-CM | POA: Diagnosis not present

## 2021-01-19 DIAGNOSIS — E1151 Type 2 diabetes mellitus with diabetic peripheral angiopathy without gangrene: Secondary | ICD-10-CM | POA: Diagnosis not present

## 2021-01-19 DIAGNOSIS — L84 Corns and callosities: Secondary | ICD-10-CM | POA: Diagnosis not present

## 2021-01-19 DIAGNOSIS — L97522 Non-pressure chronic ulcer of other part of left foot with fat layer exposed: Secondary | ICD-10-CM | POA: Diagnosis not present

## 2021-01-19 DIAGNOSIS — I739 Peripheral vascular disease, unspecified: Secondary | ICD-10-CM | POA: Diagnosis not present

## 2021-01-22 ENCOUNTER — Encounter: Payer: Self-pay | Admitting: Family Medicine

## 2021-01-22 ENCOUNTER — Telehealth (INDEPENDENT_AMBULATORY_CARE_PROVIDER_SITE_OTHER): Payer: Medicare PPO | Admitting: Family Medicine

## 2021-01-22 ENCOUNTER — Other Ambulatory Visit: Payer: Self-pay | Admitting: Family Medicine

## 2021-01-22 DIAGNOSIS — E119 Type 2 diabetes mellitus without complications: Secondary | ICD-10-CM | POA: Diagnosis not present

## 2021-01-22 DIAGNOSIS — F3341 Major depressive disorder, recurrent, in partial remission: Secondary | ICD-10-CM

## 2021-01-22 DIAGNOSIS — N390 Urinary tract infection, site not specified: Secondary | ICD-10-CM

## 2021-01-22 MED ORDER — INSULIN DEGLUDEC 200 UNIT/ML ~~LOC~~ SOPN: 60.0000 [IU] | PEN_INJECTOR | Freq: Every morning | SUBCUTANEOUS | 11 refills | Status: DC

## 2021-01-22 MED ORDER — DESVENLAFAXINE SUCCINATE ER 25 MG PO TB24: 25.0000 mg | ORAL_TABLET | Freq: Every day | ORAL | 0 refills | Status: DC

## 2021-01-22 MED ORDER — FLUOXETINE HCL 40 MG PO CAPS: 40.0000 mg | ORAL_CAPSULE | Freq: Every day | ORAL | 3 refills | Status: DC

## 2021-01-22 NOTE — Progress Notes (Signed)
Phone [phone] Virtual visit via Video note   Subjective:  Chief complaint: Chief Complaint  Patient presents with   Depression    Patient wants to change her medication back     Frequent UTI     Patient wants to discuss the medication that she was on to help prevent her UTI      This visit type was conducted due to national recommendations for restrictions regarding the COVID-19 Pandemic (e.g. social distancing).  This format is felt to be most appropriate for this patient at this time balancing risks to patient and risks to population by having him in for in person visit.  No physical exam was performed (except for noted visual exam or audio findings with Telehealth visits).    Our team/I connected with [name] at  4:00 PM EDT by a video enabled telemedicine application (doxy.me or caregility through epic) and verified that I am speaking with the correct person using two identifiers.  Location patient: [facility] Location provider: [facility], office Persons participating in the virtual visit:  patient  Our team/I discussed the limitations of evaluation and management by telemedicine and the availability of in person appointments. In light of current covid-19 pandemic, patient also understands that we are trying to protect them by minimizing in office contact if at all possible.  The patient expressed consent for telemedicine visit and agreed to proceed. Patient understands insurance will be billed.   Past Medical History-  Patient Active Problem List   Diagnosis Date Noted   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    Priority: High   Bronchiectasis ([place]) 10/18/2015    Priority: High   Diabetes mellitus type II, controlled ([place]) 12/28/2006    Priority: High   Adenomatous polyp 12/20/2018    Priority: Medium   Personal history of colonic polyps 05/18/2017    Priority: Medium   Aortic atherosclerosis ([place]) 02/16/2016    Priority: Medium   Solitary pulmonary nodule 01/08/2016     Priority: Medium   IBS (irritable bowel syndrome) 07/17/2014    Priority: Medium   Recurrent major depression in partial remission ([place]) 01/03/2014    Priority: Medium   Hypertension associated with diabetes ([place]) 01/03/2014    Priority: Medium   Primary open-angle glaucoma 08/02/2012    Priority: Medium   Fatty liver 03/18/2009    Priority: Medium   Hyperlipidemia associated with type 2 diabetes mellitus ([place]) 07/26/2007    Priority: Medium   ANEMIA, B12 DEFICIENCY 12/29/2006    Priority: Medium   RESTLESS LEG SYNDROME, MILD 12/28/2006    Priority: Medium   Osteoporosis 12/12/2006    Priority: Medium   History of total knee replacement, bilateral 11/14/2017    Priority: Low   Arthritis of midfoot 03/03/2016    Priority: Low   Hammertoes of both feet 03/03/2016    Priority: Low   Posterior tibial tendon dysfunction 01/03/2014    Priority: Low   Osteoarthritis, knee 01/03/2014    Priority: Low   Glaucoma 01/03/2014    Priority: Low   Obesity (BMI 30-39.9) 06/15/2013    Priority: Low   Foot tendinitis 07/20/2010    Priority: Low   INSOMNIA, CHRONIC 07/25/2009    Priority: Low   GERD 12/25/2007    Priority: Low   NEUROPATHY, IDIOPATHIC PERIPHERAL NEC 12/28/2006    Priority: [place], HX OF 12/12/2006    Priority: Low   Hoarseness 10/17/2020   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 11/22/2019   Healthcare maintenance 02/22/2019  Physical deconditioning 02/22/2019   Shortness of breath 02/22/2019   Pain due to onychomycosis of toenails of both feet 11/08/2018   Diabetic neuropathy ([place]) 11/08/2018    Medications- reviewed and updated Current Outpatient Medications  Medication Sig Dispense Refill   ACCU-CHEK GUIDE test strip USE 1 TO TEST UP TO TWICE DAILY 200 each 0   atenolol (TENORMIN) 25 MG tablet Take 1 tablet by mouth once daily 90 tablet 3   Bacillus Coagulans-Inulin (ALIGN PREBIOTIC-PROBIOTIC PO) Take 1 capsule by mouth daily.       blood glucose meter kit and supplies KIT Dispense based on patient and insurance preference. Use up to four times daily as directed. Dx E11.9 1 each 0   cetirizine (ZYRTEC) 10 MG tablet Take 10 mg by mouth every morning.      Cholecalciferol (VITAMIN D3) 125 MCG (5000 UT) TABS Take 5,000 Units by mouth daily.     Coenzyme Q10 (COQ10) 200 MG CAPS Take 200 mg by mouth daily.     colesevelam (WELCHOL) 625 MG tablet TAKE 1 TABLET BY MOUTH ONCE DAILY IN THE MORNING AND AT NOON AND AT BEDTIME 90 tablet 3   Desvenlafaxine Succinate ER (PRISTIQ) 25 MG TB24 Take 25 mg by mouth daily. 7 tablet 0   dicyclomine (BENTYL) 20 MG tablet Take 1 tablet (20 mg total) by mouth in the morning, at noon, in the evening, and at bedtime. 120 tablet 5   dorzolamide-timolol (COSOPT) 22.3-6.8 MG/ML ophthalmic solution Place 1 drop into both eyes 2 (two) times daily.      ESTRING 2 MG vaginal ring INSERT 1 RING  INTO VAGINA EVERY 3 MONTHS 1 each 4   FIBER PO Take 3 tablets by mouth every evening.     fluconazole (DIFLUCAN) 150 MG tablet Take 1 tablet (150 mg total) by mouth once as needed for up to 1 dose. 1 tablet 5   FLUoxetine (PROZAC) 40 MG capsule Take 1 capsule (40 mg total) by mouth daily. 90 capsule 3   gabapentin (NEURONTIN) 100 MG capsule TAKE 1 CAPSULE BY MOUTH THREE TIMES DAILY AS NEEDED FOR  NEUROPATHIC  PAIN. 270 capsule 3   glimepiride (AMARYL) 4 MG tablet TAKE 2 TABLETS BY MOUTH ONCE DAILY WITH BREAKFAST 180 tablet 0   glucose blood (ACCU-CHEK GUIDE) test strip Use to test blood sugars daily. Dx: E11.9 100 each 6   insulin degludec (TRESIBA) 200 UNIT/ML FlexTouch Pen Inject 60-80 Units into the skin in the morning. 4 mL 11   Insulin Pen Needle (NOVOFINE PLUS PEN NEEDLE) 32G X 4 MM MISC 1 each by Does not apply route daily. 30 each 5   Lancets Misc. (ACCU-CHEK FASTCLIX LANCET) KIT Use to test blood sugars daily. Dx: E11.9 1 kit 5   latanoprost (XALATAN) 0.005 % ophthalmic solution Place 1 drop into the right  eye at bedtime.      lisinopril (ZESTRIL) 10 MG tablet Take 1 tablet (10 mg total) by mouth daily. 90 tablet 3   lovastatin (MEVACOR) 10 MG tablet Take 1 tablet (10 mg total) by mouth daily. 90 tablet 3   Misc Natural Products (NEURIVA PO) Take by mouth.     Multiple Vitamin (MULTIVITAMIN WITH MINERALS) TABS tablet Take 1 tablet by mouth daily. Centrum Silver     pantoprazole (PROTONIX) 40 MG tablet Take 1 tablet (40 mg total) by mouth daily. Take 1 hour around food. 30 tablet 3   pramipexole (MIRAPEX) 0.5 MG tablet TAKE 1 TABLET BY MOUTH AT BEDTIME  90 tablet 0   Respiratory Therapy Supplies (FLUTTER) DEVI Use as directed 1 each 0   ROCKLATAN 0.02-0.005 % SOLN Place 1 drop into the left eye at bedtime.      Selenium 200 MCG CAPS Take 200 mcg by mouth at bedtime.      sulfamethoxazole-trimethoprim (BACTRIM) 400-80 MG tablet Take 1 tablet by mouth daily. 90 tablet 1   SYMBICORT 160-4.5 MCG/ACT inhaler INHALE 2 PUFFS BY MOUTH TWICE DAILY . 10.2 g 11   temazepam (RESTORIL) 15 MG capsule TAKE 1 CAPSULE BY MOUTH AT BEDTIME AS NEEDED FOR SLEEP 30 capsule 0   UNABLE TO FIND Fiber therapy     vitamin B-12 (CYANOCOBALAMIN) 1000 MCG tablet Take 1,000 mcg by mouth daily.     No current facility-administered medications for this visit.     Objective:  LMP 05/10/1992 Comment: partial self reported vitals Gen: NAD, resting comfortably Lungs: nonlabored, normal respiratory rate  Skin: appears dry, no obvious rash     Assessment and Plan  #Recurrent UTI with recent  UTI S: today reports UTI symptoms resolved after last treatment. Has exploratory surgery now planned in early november   08/11/2020-patient with mixed genital flora not indicative of UTI 07/17/2020-patient with Klebsiella pneumonia 100,000 colonies resistant to ampicillin, ciprofloxacin, levofloxacin, nitrofurantoin, Bactrim.  Multiple allergies and ultimately treated with Augmentin. 06/24/2020-patient with Klebsiella pneumonia 100,000  colonies resistant to ampicillin, ciprofloxacin, levofloxacin, nitrofurantoin, Bactrim.  She was treated with Augmentin and referred to urology 05/13/2020-patient with Klebsiella pneumonia 100,000 colonies with similar resistance pattern-treated with Augmentin 05/05/20 Urine culture negative 03/07/20 fistula surgery with Dr. Johney Maine 02/21/20 Klebsiella pneumoniae 02/05/20 multiple species 01/25/20 klebseilla pneumoniae (only resistant to ampicillin and nitrofurantoin) 8/29/22had follow-up with Dr. Johney Maine on 01/05/2021-was told would need exploratory surgery - she does not want to have more surgery right now 01/08/20 klebsiella  Pneumoniae (resisnat to ampicillin and nitrofurantoin)  01/14/21 visit- no UTI on culture but had dose of keflex prior to taking urine culture. She was treated with agumentin (prior UTIs sensitive). Plan was to resume bactrim after finishing course.   Prior RX -keflex issues- irritates IBS -treated by Dr. Sabra Heck with Bactrim- as was not tolerating keflex initially   A/P: recurrent UTI- on bactrim prophylaxis. Prior breakthrough UTI cleared with augmentin treatment  # Diabetes S: Medication:Glimepiride 8 mg daily, tresiba up to 59 units now  CBGs- CBGs between 130-160 Lab Results  Component Value Date   HGBA1C 8.5 (H) 12/18/2020   HGBA1C 8.3 (H) 08/11/2020   HGBA1C 7.4 (H) 04/21/2020  A/P: poor control still- titrate to 60 units. Change to 200 units/ml 3 ml syringes due to larger doses to reduce number of times she has to give 2 shots. Hoping 60 units will work because next titration would be 62 units- would need to be very cautious with this for potential hypoglycemia.  # Depression S: Medication: Pristiq 100 mg daily - no improvement on Pristiq 50 mg reported in last visit so it was increased- she started getting very sleepy with 100mg  dose but does not see significant improvement in symptoms yet Depression screen [facility] 2/9 12/18/2020 08/11/2020 04/21/2020  Decreased Interest 0  1 2  Down, Depressed, Hopeless 3 1 2   PHQ - 2 Score 3 2 4   Altered sleeping 0 2 0  Tired, decreased energy 3 3 3   Change in appetite 0 3 0  Feeling bad or failure about yourself  0 0 0  Trouble concentrating 0 0 0  Moving slowly or fidgety/restless  0 0 0  Suicidal thoughts 0 0 0  PHQ-9 Score $RemoveBef'6 10 7  'LzDCvbHngm$ Difficult doing work/chores Not difficult at all Somewhat difficult Somewhat difficult  Some recent data might be hidden  A/P: not tolerating $RemoveBefore'100mg'HkCVrzFXKlCZX$  pristiq and $RemoveBef'50mg'YFGuYukhgZ$  did not improve her depression. We will reduce pristiq to 50 mg again for 1 week then reduce further to 25 mg for an additional week. After the week fo 25 mg pristiq- can stop and start the prozac $RemoveBe'40mg'BxWSwRNhG$ .   After 2 weeks on prozac 40 mg she will reach out to me- would plan on adding wellbutrin $RemoveBeforeDEI'150mg'PfmWjdBvElJLmeOy$  XR  Recommended follow up: keep November visit Future Appointments  Date Time Provider [place]  02/13/2021  4:00 PM Jaquita Folds, MD [facility]  03/30/2021  1:20 PM Marin Olp, MD [facility]  04/13/2021  1:15 PM Megan Salon, MD [facility]   Lab/Order associations:   ICD-10-CM   1. Diabetes mellitus without complication (HCC)  N62.9     2. Recurrent UTI  N39.0     3. Recurrent major depression in partial remission (HCC)  F33.41       Meds ordered this encounter  Medications   insulin degludec (TRESIBA) 200 UNIT/ML FlexTouch Pen    Sig: Inject 60-80 Units into the skin in the morning.    Dispense:  4 mL    Refill:  11   Desvenlafaxine Succinate ER (PRISTIQ) 25 MG TB24    Sig: Take 25 mg by mouth daily.    Dispense:  7 tablet    Refill:  0    Will take 50 mg desvenlafaxine from remaining home supply for 1 week, then 7 days of 25 mg, then will stop and start prozac $RemoveBefo'40mg'dAJvbvttgDf$  back next day   FLUoxetine (PROZAC) 40 MG capsule    Sig: Take 1 capsule (40 mg total) by mouth daily.    Dispense:  90 capsule    Refill:  3   Return precautions advised.  [name], MD

## 2021-01-22 NOTE — Telephone Encounter (Signed)
Patient has been scheduled

## 2021-01-27 ENCOUNTER — Ambulatory Visit: Payer: Medicare PPO | Admitting: Family Medicine

## 2021-01-29 ENCOUNTER — Other Ambulatory Visit: Payer: Self-pay | Admitting: Family Medicine

## 2021-02-09 ENCOUNTER — Encounter: Payer: Self-pay | Admitting: Family Medicine

## 2021-02-11 ENCOUNTER — Encounter: Payer: Self-pay | Admitting: Family

## 2021-02-11 ENCOUNTER — Telehealth: Payer: Self-pay

## 2021-02-11 ENCOUNTER — Ambulatory Visit: Payer: Medicare PPO | Admitting: Family

## 2021-02-11 ENCOUNTER — Other Ambulatory Visit: Payer: Self-pay

## 2021-02-11 DIAGNOSIS — M25471 Effusion, right ankle: Secondary | ICD-10-CM

## 2021-02-11 DIAGNOSIS — M25472 Effusion, left ankle: Secondary | ICD-10-CM

## 2021-02-11 DIAGNOSIS — R002 Palpitations: Secondary | ICD-10-CM

## 2021-02-11 DIAGNOSIS — R0602 Shortness of breath: Secondary | ICD-10-CM | POA: Diagnosis not present

## 2021-02-11 MED ORDER — BUDESONIDE-FORMOTEROL FUMARATE 80-4.5 MCG/ACT IN AERO: 2.0000 | INHALATION_SPRAY | Freq: Two times a day (BID) | RESPIRATORY_TRACT | 3 refills | Status: DC

## 2021-02-11 NOTE — Patient Instructions (Addendum)
Restart the lower dose of Symbicort, sent to your pharmacy today, to help with your shortness of breath. Notify office if any increase in intensity or frequency of palpitations or heart racing sensation, or if shortness of breath does not improve. Discuss ankle/foot edema with PCP at next visit if still a concern.

## 2021-02-11 NOTE — Assessment & Plan Note (Addendum)
Reports seeing pulmonology 2 months ago and her Symbicort was stopped r/t possible upper airway irritation causing her hoarseness. She states it has not made a difference stopping and now with increased SOB, will restart lower dose.

## 2021-02-11 NOTE — Assessment & Plan Note (Signed)
Ongoing problem that has been discussed with PCP. Mild ankle and foot edema noted today, pt very reluctant to start any diuretic, advised on low Na diet, drinking 2L of water qd, elevate feet whenever sitting.

## 2021-02-11 NOTE — Telephone Encounter (Signed)
Nurse Assessment Nurse: Raenette Rover, RN, Zella Ball Date/Time (Eastern Time): 02/11/2021 9:47:12 AM Confirm and document reason for call. If symptomatic, describe symptoms. ---Caller states she is experiencing SOB and heart palpitations . More sob than usual and has occasional times of heart racing. Denies these symptoms at this time last occurrence was several days ago. Mainly called schedule an appt for an anal fisher. Gets sob with exertion. Not on oxygen and hx of Bronchietasis Does the patient have any new or worsening symptoms? ---Yes Will a triage be completed? ---Yes Related visit to physician within the last 2 weeks? ---No Does the PT have any chronic conditions? (i.e. diabetes, asthma, this includes High risk factors for pregnancy, etc.) ---Yes List chronic conditions. ---diabetes, thyroid, Bronchiectasis Is this a behavioral health or substance abuse call? ---No Guidelines Guideline Title Affirmed Question Affirmed Notes Nurse Date/Time (Eastern Time) Heart Rate and Heartbeat Questions New or worsened shortness of breath Raenette Rover, RN, Zella Ball 02/11/2021 9:53:22 AM PLEASE NOTE: All timestamps contained within this report are represented as [place] Standard Time. CONFIDENTIALTY NOTICE: This fax transmission is intended only for the addressee. It contains information that is legally privileged, confidential or otherwise protected from use or disclosure. If you are not the intended recipient, you are strictly prohibited from reviewing, disclosing, copying using or disseminating any of this information or taking any action in reliance on or regarding this information. If you have received this fax in error, please notify us immediately by telephone so that we can arrange for its return to [name]. Phone: [phone], Toll-Free: [phone], Fax: [phone] Page: 2 of 2 Call Id: [id number] Guidelines Guideline Title Affirmed Question Affirmed Notes Nurse Date/Time Eilene Ghazi Time) with  activity (dyspnea on exertion) Disp. Time Eilene Ghazi Time) Disposition Final User 02/11/2021 9:45:34 AM Send to Urgent Tobie Lords 02/11/2021 9:57:44 AM Go to ED Now (or PCP triage) Yes Raenette Rover, RN, Herbert Deaner Disagree/Comply Comply Caller Understands Yes PreDisposition InappropriateToAsk Care Advice Given Per Guideline GO TO ED NOW (OR PCP TRIAGE): BRING MEDICINES: CARE ADVICE given per Heart Rate and Heartbeat Questions (Adult) guideline. Comments User: Wilson Singer, RN Date/Time Eilene Ghazi Time): 02/11/2021 9:57:41 AM c/o worsening sob on exertion and some swelling in both lower legs now. Caller resides in a wheel chair. User: Wilson Singer, RN Date/Time Eilene Ghazi Time): 02/11/2021 9:59:58 AM Appt for 11:30 am today and caller said it will take her a little bit to get dressed but should be able to make it. Referrals Warm transfer to [facility]

## 2021-02-11 NOTE — Progress Notes (Signed)
Acute Office Visit  Subjective:    Patient ID: [name], female    DOB: 05/02/1941, 80 y.o.   MRN: [id number]  Chief Complaint  Patient presents with   Shortness of Breath    On exertion; Pt says 2 weeks or longer, she really doesn't know. She will be having surgery 11/10.    HPI Patient is in today for SOB with exertion and heart palpitations, starting about 2 weeks ago, last noticed 1 week ago. Reports swelling in bilateral ankles and feet. Denies any new meds or recent med changes. Had an echocardiogram recently. Reports she will have another anal fissure surgery on 03/19/2021.   Past Medical History:  Diagnosis Date   Allergy    Anemia    Anxiety    Arthritis    right knee;injections every 51months    Asthma    use daily   Breast cancer ([place]) 1994/1995   HX BREAST CANCER/ right brreast and left breast in 1995   Bronchiectasis ([place])    COMPRESSION FRACTURE, LUMBAR VERTEBRAE 08/21/2008   Qualifier: Diagnosis of  By: Arnoldo Morale MD, Balinda Quails    Depression    Diabetes mellitus    takes Amaryl and Januvia daily   Difficulty sleeping    Diverticulitis    Dyspnea    with activity   Early cataracts, bilateral    Eczema    Endometriosis    Fatty liver    Gastritis    Glaucoma    Hammer toe    History of kidney stones    Hyperlipidemia associated with type 2 diabetes mellitus ([place]) 07/26/2007   Diet/exercise control.     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      blood glucose meter kit and supplies KIT Dispense based on patient and insurance preference. Use up to four times daily as directed. Dx E11.9 1 each 0   cetirizine (ZYRTEC) 10 MG tablet Take 10 mg by mouth every morning.      Cholecalciferol (VITAMIN D3) 125 MCG (5000 UT) TABS Take 5,000 Units by mouth daily.     Coenzyme Q10 (COQ10) 200 MG CAPS Take 200 mg by mouth daily.  colesevelam (WELCHOL) 625 MG tablet TAKE 1 TABLET BY MOUTH ONCE DAILY IN THE MORNING AND AT NOON AND AT BEDTIME 90 tablet 3   Desvenlafaxine Succinate ER (PRISTIQ) 25 MG TB24 Take 25 mg by mouth daily. 7 tablet 0   dorzolamide-timolol (COSOPT) 22.3-6.8 MG/ML ophthalmic solution Place 1 drop into both eyes 2 (two) times daily.      ESTRING 2 MG vaginal ring INSERT 1 RING  INTO VAGINA EVERY 3 MONTHS 1 each 4   FIBER PO Take 3 tablets by mouth every evening.     FLUoxetine (PROZAC) 40 MG capsule Take 1 capsule (40 mg total) by mouth daily. 90 capsule 3   gabapentin (NEURONTIN) 100 MG capsule TAKE 1 CAPSULE BY MOUTH THREE TIMES DAILY AS NEEDED FOR  NEUROPATHIC  PAIN. 270 capsule 3   glimepiride (AMARYL) 4 MG tablet TAKE 2 TABLETS BY MOUTH ONCE DAILY WITH BREAKFAST 180 tablet 0   glucose blood (ACCU-CHEK GUIDE) test strip Use to test blood sugars daily. Dx: E11.9 100 each 6   insulin degludec (TRESIBA) 200 UNIT/ML FlexTouch Pen Inject 60-80 Units into the skin in the morning. 4 mL 11   Insulin Pen Needle  (NOVOFINE PLUS PEN NEEDLE) 32G X 4 MM MISC 1 each by Does not apply route daily. 30 each 5   Lancets Misc. (ACCU-CHEK FASTCLIX LANCET) KIT Use to test blood sugars daily. Dx: E11.9 1 kit 5   latanoprost (XALATAN) 0.005 % ophthalmic solution Place 1 drop into the right eye at bedtime.      lisinopril (ZESTRIL) 10 MG tablet Take 1 tablet (10 mg total) by mouth daily. 90 tablet 3   lovastatin (MEVACOR) 10 MG tablet Take 1 tablet (10 mg total) by mouth daily. 90 tablet 3   Misc Natural Products (NEURIVA PO) Take by mouth.     Multiple Vitamin (MULTIVITAMIN WITH MINERALS) TABS tablet Take 1 tablet by mouth daily. Centrum Silver     pantoprazole (PROTONIX) 40 MG tablet Take 1 tablet (40 mg total) by mouth daily. Take 1 hour around food. 30 tablet 3   pramipexole (MIRAPEX) 0.5 MG tablet TAKE 1 TABLET BY MOUTH AT BEDTIME 90 tablet 0   Respiratory Therapy Supplies (FLUTTER) DEVI Use as directed 1 each 0   ROCKLATAN 0.02-0.005 % SOLN Place 1 drop into the left eye at bedtime.      Selenium 200 MCG CAPS Take 200 mcg by mouth at bedtime.      sulfamethoxazole-trimethoprim (BACTRIM) 400-80 MG tablet Take 1 tablet by mouth daily. 90 tablet 1   temazepam (RESTORIL) 15 MG capsule TAKE 1 CAPSULE BY MOUTH AT BEDTIME AS NEEDED FOR SLEEP 30 capsule 5   UNABLE TO FIND Fiber therapy     vitamin B-12 (CYANOCOBALAMIN) 1000 MCG tablet Take 1,000 mcg by mouth daily.     SYMBICORT 160-4.5 MCG/ACT inhaler INHALE 2 PUFFS BY MOUTH TWICE DAILY . 10.2 g 11   dicyclomine (BENTYL) 20 MG tablet Take 1 tablet (20 mg total) by mouth in the morning, at noon, in the evening, and at bedtime. (Patient not taking: Reported on 02/11/2021) 120 tablet 5   fluconazole (DIFLUCAN) 150 MG tablet Take 1 tablet (150 mg total) by mouth once as needed for up to 1 dose. (Patient not taking: Reported on 02/11/2021) 1 tablet 5   No facility-administered medications prior to visit.    Allergies  Allergen Reactions   Nitrofurantoin Other (See  Comments)    Severe headache HEADACHE   Levaquin [Levofloxacin In D5w]  Other (See Comments)    Pain in tendons    Brimonidine Other (See Comments)    Made eyes and surrounding areas RED/ was an eye drop   Cephalexin Diarrhea and Other (See Comments)    Patient can't remember reaction (per chart at [facility] states diarrhea) (tolerates Augmentin fine)   Erythromycin Ethylsuccinate Hives and Diarrhea   Oxycodone Itching    Review of Systems See HPI above.     Objective:    Physical Exam Vitals and nursing note reviewed.  Cardiovascular:     Rate and Rhythm: Normal rate and regular rhythm.     Comments: Edema in bilateral ankles and feet, L > R Pulmonary:     Effort: Pulmonary effort is normal. No respiratory distress.     Breath sounds: Normal breath sounds.  Musculoskeletal:     Right lower leg: 1+ Pitting Edema present.     Left lower leg: 1+ Pitting Edema present.  Skin:    General: Skin is warm and dry.  Neurological:     Mental Status: She is alert and oriented to person, place, and time.  Psychiatric:        Mood and Affect: Mood normal.     BP 117/74   Pulse 72   Temp 98.2 F (36.8 C) (Temporal)   Ht 6' (1.829 m)   Wt 244 lb 14.9 oz (111.1 kg)   LMP 05/10/1992 Comment: partial  SpO2 90%   BMI 33.22 kg/m  Wt Readings from Last 3 Encounters:  02/11/21 244 lb 14.9 oz (111.1 kg)  12/24/20 245 lb (111.1 kg)  10/17/20 245 lb (111.1 kg)    Health Maintenance Due  Topic Date Due   COVID-19 Vaccine (4 - Booster for Pfizer series) 08/23/2020   INFLUENZA VACCINE  12/08/2020    There are no preventive care reminders to display for this patient.   Lab Results  Component Value Date   TSH 3.80 10/29/2020   Lab Results  Component Value Date   WBC 9.5 12/18/2020   HGB 12.8 12/18/2020   HCT 39.5 12/18/2020   MCV 91.3 12/18/2020   PLT 247.0 12/18/2020   Lab Results  Component Value Date   NA 140 12/18/2020   K 4.4 12/18/2020   CO2 32 12/18/2020   GLUCOSE  139 (H) 12/18/2020   BUN 11 12/18/2020   CREATININE 0.58 12/18/2020   BILITOT 0.4 12/18/2020   ALKPHOS 79 12/18/2020   AST 12 12/18/2020   ALT 11 12/18/2020   PROT 6.6 12/18/2020   ALBUMIN 3.7 12/18/2020   CALCIUM 9.4 12/18/2020   ANIONGAP 11 02/28/2020   GFR 85.16 12/18/2020   Lab Results  Component Value Date   CHOL 125 12/18/2020   Lab Results  Component Value Date   HDL 43.60 12/18/2020   Lab Results  Component Value Date   LDLCALC 61 12/18/2020   Lab Results  Component Value Date   TRIG 102.0 12/18/2020   Lab Results  Component Value Date   CHOLHDL 3 12/18/2020   Lab Results  Component Value Date   HGBA1C 8.5 (H) 12/18/2020       Assessment & Plan:   Problem List Items Addressed This Visit       Other   SOB (shortness of breath) on exertion    Reports seeing pulmonology 2 months ago and her Symbicort was stopped r/t possible upper airway irritation causing her hoarseness. She states it has not made a difference stopping and now with increased SOB, will restart  lower dose.      Relevant Medications   budesonide-formoterol (SYMBICORT) 80-4.5 MCG/ACT inhaler   Heart palpitations    pt reports having while resting in recliner 2-3 times over last couple of weeks, the last time 1 week ago. reports feeling her heart racing at the same time, denies SOB or dizziness when episode occurs.      Ankle edema, bilateral    Ongoing problem that has been discussed with PCP. Mild ankle and foot edema noted today, pt very reluctant to start any diuretic, advised on low Na diet, drinking 2L of water qd, elevate feet whenever sitting.        Meds ordered this encounter  Medications   budesonide-formoterol (SYMBICORT) 80-4.5 MCG/ACT inhaler    Sig: Inhale 2 puffs into the lungs 2 (two) times daily.    Dispense:  1 each    Refill:  3    Order Specific Question:   Supervising Provider    Answer:   ANDY, CAMILLE L [[id number]]     Jeanie Sewer, NP

## 2021-02-11 NOTE — Telephone Encounter (Signed)
Please schedule pt for follow up on this. 

## 2021-02-11 NOTE — Assessment & Plan Note (Signed)
pt reports having while resting in recliner 2-3 times over last couple of weeks, the last time 1 week ago. reports feeling her heart racing at the same time, denies SOB or dizziness when episode occurs.

## 2021-02-13 ENCOUNTER — Ambulatory Visit: Payer: Medicare PPO | Admitting: Obstetrics and Gynecology

## 2021-02-22 ENCOUNTER — Encounter: Payer: Self-pay | Admitting: Family Medicine

## 2021-02-23 ENCOUNTER — Telehealth (INDEPENDENT_AMBULATORY_CARE_PROVIDER_SITE_OTHER): Payer: Medicare PPO | Admitting: Family

## 2021-02-23 ENCOUNTER — Other Ambulatory Visit: Payer: Self-pay

## 2021-02-23 ENCOUNTER — Encounter: Payer: Self-pay | Admitting: Family

## 2021-02-23 ENCOUNTER — Telehealth: Payer: Self-pay

## 2021-02-23 DIAGNOSIS — F3341 Major depressive disorder, recurrent, in partial remission: Secondary | ICD-10-CM

## 2021-02-23 DIAGNOSIS — N390 Urinary tract infection, site not specified: Secondary | ICD-10-CM

## 2021-02-23 MED ORDER — DESVENLAFAXINE SUCCINATE ER 50 MG PO TB24: 50.0000 mg | ORAL_TABLET | Freq: Every day | ORAL | Status: DC

## 2021-02-23 NOTE — Telephone Encounter (Signed)
Provider was unable to do appointment virtually, offered an appointment tomorrow at 940 am but they were unable to do the appointment due to conflict. Any way we can work patient in for an appointment?

## 2021-02-23 NOTE — Telephone Encounter (Signed)
Patient ended up doing visit with [facility].

## 2021-02-23 NOTE — Progress Notes (Signed)
Virtual telephone visit    Virtual Visit via Telephone Note   This visit type was conducted due to national recommendations for restrictions regarding the COVID-19 Pandemic (e.g. social distancing) in an effort to limit this patient's exposure and mitigate transmission in our community. Due to her co-morbid illnesses, this patient is at least at moderate risk for complications without adequate follow up. This format is felt to be most appropriate for this patient at this time. The patient did not have access to video technology or had technical difficulties with video requiring transitioning to audio format only (telephone). Physical exam was limited to content and character of the telephone converstion. CMA was able to get the patient set up on a telephone visit.   Patient location: Home. Patient and provider in visit Provider location: Office  I discussed the limitations of evaluation and management by telemedicine and the availability of in person appointments. The patient expressed understanding and agreed to proceed.   Visit Date: 02/23/2021  Today's healthcare provider: Jeanie Sewer, NP     Subjective:    Patient ID: [name], female    DOB: 03-06-1941, 80 y.o.   MRN: [id number]  Chief Complaint  Patient presents with   Depression    Pt says that she would like to go back on Pristiq. She says that it works better than Zoloft and Prozac.   Foul odor   Urinary Incontinence    Pt says that she ws treated by her GYN 12/29/2020. She states that she is on Bactrim and she needs a stronger dose. She was also treated by [facility] 9/7.    Depression, Follow-up  She  was last seen for this 3 months ago. Changes made at last visit include stopping Pristiq and adding back Prozac.   She reports good compliance with treatment. She is having side effects. Still complains of drowsiness which she thought was from the [place]. She would like to go back on the Pristiq since she  thinks this worked better for her depressive symptoms.  She reports fair tolerance of treatment. Current symptoms include: depressed mood  Urinary symptoms  She reports chronic cloudy malodorous urine, urinary frequency, and urinary urgency. The current episode started about a month ago and is staying constant. Patient states symptoms are moderate in intensity, occurring every day. She  has been recently treated for similar symptoms. Reports her urogynecologist started her on a preventative low dose of Bactrim.    -----------------------------------------------------------------------------------------  Past Medical History:  Diagnosis Date   Allergy    Anemia    Anxiety    Arthritis    right knee;injections every 12months    Asthma    use daily   Breast cancer ([place]) 1994/1995   HX BREAST CANCER/ right brreast and left breast in 1995   Bronchiectasis ([place])    COMPRESSION FRACTURE, LUMBAR VERTEBRAE 08/21/2008   Qualifier: Diagnosis of  By: Arnoldo Morale MD, Balinda Quails    Depression    Diabetes mellitus    takes Amaryl and Januvia daily   Difficulty sleeping    Diverticulitis    Dyspnea    with activity   Early cataracts, bilateral    Eczema    Endometriosis    Fatty liver    Gastritis    Glaucoma    Hammer toe    History of kidney stones    Hyperlipidemia associated with type 2 diabetes mellitus ([place]) 07/26/2007   Diet/exercise control.     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   Thyroid disease    Vitamin D deficiency    takes Vit d daily   Weakness    uses a walker and wheelchair    Past Surgical History:  Procedure Laterality Date   ADENOIDECTOMY     at age 60   ANAL FISTULOTOMY N/A 03/07/2020   Procedure: ANAL FISTULOTOMY WITH MARSUPIALIZATION;  Surgeon: Michael Boston, MD;  Location: [facility];  Service: General;  Laterality: N/A;   BIOPSY  12/19/2018   Procedure: BIOPSY;  Surgeon: Mauri Pole, MD;  Location: [facility];  Service: Endoscopy;;   CATARACT EXTRACTION     CHOLECYSTECTOMY  06/2008   COLONOSCOPY WITH PROPOFOL N/A 05/17/2017   Procedure: COLONOSCOPY WITH PROPOFOL;  Surgeon: Mauri Pole, MD;  Location: [facility];  Service: Endoscopy;  Laterality: N/A;  PT WILL BE ADMITTED THE DAY BEFORE FOR PREP PER ROBIN KB   COLONOSCOPY WITH PROPOFOL N/A 12/19/2018   Procedure: COLONOSCOPY WITH PROPOFOL;  Surgeon: Mauri Pole, MD;  Location: [facility];  Service: Endoscopy;  Laterality: N/A;   DILATION AND CURETTAGE OF UTERUS     excision on breast     internal infected suture from breast surgery   excision removed from neck     infected lymph node   FOOT SURGERY     left foot-cleaning out areas on toes at the wound center-having MRI to determine if osteomyelitis   HEMORRHOID SURGERY N/A 11/22/2019   Procedure: [place] , PEXY;  Surgeon: Michael Boston, MD;  Location: [place];  Service: General;  Laterality: N/A;   HYPERBARIC OXYGEN THERAPY     FEET INFECTION   HYSTEROSCOPY  06/22/11   PMB submucosal myoma   JOINT REPLACEMENT     left total shoulder   LAPAROSCOPIC OOPHORECTOMY Right 12/2004   absent LSO   LAPAROTOMY     LIGATION OF INTERNAL FISTULA TRACT N/A 11/22/2019   Procedure: REPAIR OF PERIRECTAL FISTULA, ANORECTAL EXAMINATION UNDER ANESTHESIA;  Surgeon: Michael Boston,  MD;  Location: [place];  Service: General;  Laterality: N/A;   MASTECTOMY Bilateral 1994 right, 1995 left   POLYPECTOMY  12/19/2018   Procedure: POLYPECTOMY;  Surgeon: Mauri Pole, MD;  [facility]: [facility];  Service: Endoscopy;;   RECTAL EXAM UNDER ANESTHESIA N/A 03/07/2020   Procedure: ANORECTAL EXAM UNDER ANESTHESIA;  Surgeon: Michael Boston, MD;  Location: [place];  Service: General;  Laterality: N/A;   SHOULDER HEMI-ARTHROPLASTY  01/01/2012   Procedure: SHOULDER HEMI-ARTHROPLASTY;  Surgeon: Roseanne Kaufman, MD;  Location: [place];  Service: Orthopedics;  Laterality: Left;  Left Shoulder Hemi Arthroplasty with Repair and Reconstruction as Necessary    THYROIDECTOMY  25yrs ago. follows endocrine   TONSILLECTOMY     TOTAL KNEE ARTHROPLASTY Right 12/30/2014   Procedure: RIGHT TOTAL KNEE ARTHROPLASTY;  Surgeon: Ollen Gross, MD;  Location: [facility];  Service: Orthopedics;  Laterality: Right;   TOTAL KNEE ARTHROPLASTY Left 11/29/2016   Procedure: LEFT TOTAL KNEE ARTHROPLASTY;  Surgeon: Ollen Gross, MD;  Location: [facility];  Service: Orthopedics;  Laterality: Left;    Outpatient Medications Prior to Visit  Medication Sig Dispense Refill   ACCU-CHEK GUIDE test strip USE 1 TO TEST UP TO TWICE DAILY 200 each 0   atenolol (TENORMIN) 25 MG tablet Take 1 tablet by mouth once daily 90 tablet 3   Bacillus Coagulans-Inulin (ALIGN PREBIOTIC-PROBIOTIC PO) Take 1 capsule by mouth daily.      blood glucose meter kit and supplies KIT Dispense based on patient and insurance preference. Use up to four times daily as directed. Dx E11.9 1 each 0   budesonide-formoterol (SYMBICORT) 80-4.5 MCG/ACT inhaler Inhale 2 puffs into the lungs 2 (two) times daily. 1 each 3   cetirizine (ZYRTEC) 10 MG tablet Take 10 mg by mouth every morning.      Cholecalciferol (VITAMIN D3) 125 MCG (5000 UT) TABS Take 5,000 Units by mouth daily.     Coenzyme Q10 (COQ10) 200 MG CAPS Take 200 mg  by mouth daily.     colesevelam (WELCHOL) 625 MG tablet TAKE 1 TABLET BY MOUTH ONCE DAILY IN THE MORNING AND AT NOON AND AT BEDTIME 90 tablet 3   Desvenlafaxine Succinate ER (PRISTIQ) 25 MG TB24 Take 25 mg by mouth daily. 7 tablet 0   dicyclomine (BENTYL) 20 MG tablet Take 1 tablet (20 mg total) by mouth in the morning, at noon, in the evening, and at bedtime. 120 tablet 5   dorzolamide-timolol (COSOPT) 22.3-6.8 MG/ML ophthalmic solution Place 1 drop into both eyes 2 (two) times daily.      ESTRING 2 MG vaginal ring INSERT 1 RING  INTO VAGINA EVERY 3 MONTHS 1 each 4   FIBER PO Take 3 tablets by mouth every evening.     fluconazole (DIFLUCAN) 150 MG tablet Take 1 tablet (150 mg total) by mouth once as needed for up to 1 dose. 1 tablet 5   gabapentin (NEURONTIN) 100 MG capsule TAKE 1 CAPSULE BY MOUTH THREE TIMES DAILY AS NEEDED FOR  NEUROPATHIC  PAIN. 270 capsule 3   glimepiride (AMARYL) 4 MG tablet TAKE 2 TABLETS BY MOUTH ONCE DAILY WITH BREAKFAST 180 tablet 0   glucose blood (ACCU-CHEK GUIDE) test strip Use to test blood sugars daily. Dx: E11.9 100 each 6   insulin degludec (TRESIBA) 200 UNIT/ML FlexTouch Pen Inject 60-80 Units into the skin in the morning. 4 mL 11   Insulin Pen Needle (NOVOFINE PLUS PEN NEEDLE) 32G X 4 MM MISC 1 each by Does not apply route daily. 30 each 5   Lancets Misc. (ACCU-CHEK FASTCLIX LANCET) KIT Use to test blood sugars daily. Dx: E11.9 1 kit 5   latanoprost (XALATAN) 0.005 % ophthalmic solution Place 1 drop into the right eye at bedtime.      lisinopril (ZESTRIL) 10 MG tablet Take 1 tablet (10 mg total) by mouth daily. 90 tablet 3   lovastatin (MEVACOR) 10 MG tablet Take 1 tablet (10 mg total) by mouth daily. 90 tablet 3   Misc Natural Products (NEURIVA PO) Take by mouth.     Multiple Vitamin (MULTIVITAMIN WITH MINERALS) TABS tablet Take 1 tablet by mouth daily. Centrum Silver  pantoprazole (PROTONIX) 40 MG tablet Take 1 tablet (40 mg total) by mouth daily. Take 1  hour around food. 30 tablet 3   pramipexole (MIRAPEX) 0.5 MG tablet TAKE 1 TABLET BY MOUTH AT BEDTIME 90 tablet 0   Respiratory Therapy Supplies (FLUTTER) DEVI Use as directed 1 each 0   ROCKLATAN 0.02-0.005 % SOLN Place 1 drop into the left eye at bedtime.      Selenium 200 MCG CAPS Take 200 mcg by mouth at bedtime.      sulfamethoxazole-trimethoprim (BACTRIM) 400-80 MG tablet Take 1 tablet by mouth daily. 90 tablet 1   temazepam (RESTORIL) 15 MG capsule TAKE 1 CAPSULE BY MOUTH AT BEDTIME AS NEEDED FOR SLEEP 30 capsule 5   UNABLE TO FIND Fiber therapy     vitamin B-12 (CYANOCOBALAMIN) 1000 MCG tablet Take 1,000 mcg by mouth daily.     FLUoxetine (PROZAC) 40 MG capsule Take 1 capsule (40 mg total) by mouth daily. (Patient not taking: Reported on 02/23/2021) 90 capsule 3   No facility-administered medications prior to visit.    Allergies  Allergen Reactions   Nitrofurantoin Other (See Comments)    Severe headache HEADACHE   Levaquin [Levofloxacin In D5w] Other (See Comments)    Pain in tendons    Brimonidine Other (See Comments)    Made eyes and surrounding areas RED/ was an eye drop   Cephalexin Diarrhea and Other (See Comments)    Patient can't remember reaction (per chart at [facility] states diarrhea) (tolerates Augmentin fine)   Erythromycin Ethylsuccinate Hives and Diarrhea   Oxycodone Itching        Objective:    Physical Exam  Ht 6' (1.829 m)   Wt 244 lb 14.9 oz (111.1 kg)   LMP 05/10/1992 Comment: partial  BMI 33.22 kg/m   Wt Readings from Last 3 Encounters:  02/23/21 244 lb 14.9 oz (111.1 kg)  02/11/21 244 lb 14.9 oz (111.1 kg)  12/24/20 245 lb (111.1 kg)        Assessment & Plan:   Problem List Items Addressed This Visit       Genitourinary   Chronic UTI - advised pt increase her Bactrim to 2 pills daily = $Remov'160mg'KuZVCX$ /'800mg'$  for the next 3 days only, then reduce to 1 pill daily.      Other   Recurrent major depression in partial remission ([place])-  pt reports  taking her Prozac this am. Advised her to stop and resume the $RemoveBe'50mg'TOgNnKyjY$  Pristiq tomorrow for 3 days, then ok to increase to $RemoveBef'100mg'UMToQRfZyU$  as was her maintenance dose prior to stopping.    I am having Olivia Minium. Aversa "[name]" maintain her dorzolamide-timolol, Flutter, multivitamin with minerals, vitamin B-12, cetirizine, Selenium, Bacillus Coagulans-Inulin (ALIGN PREBIOTIC-PROBIOTIC PO), latanoprost, Vitamin D3, CoQ10, blood glucose meter kit and supplies, Accu-Chek [employer], Accu-Chek Guide, [place], UNABLE TO FIND, Misc Natural Products (NEURIVA PO), FIBER PO, Accu-Chek Guide, dicyclomine, colesevelam, Estring, lovastatin, NovoFine Plus Pen Needle, fluconazole, pantoprazole, lisinopril, atenolol, gabapentin, glimepiride, sulfamethoxazole-trimethoprim, insulin degludec, Desvenlafaxine Succinate ER, FLUoxetine, pramipexole, temazepam, and budesonide-formoterol.  No orders of the defined types were placed in this encounter.    I discussed the assessment and treatment plan with the patient. The patient was provided an opportunity to ask questions and all were answered. The patient agreed with the plan and demonstrated an understanding of the instructions.   The patient was advised to call back or seek an in-person evaluation if the symptoms worsen or if the condition fails to improve as anticipated.  I provided 25 minutes of non-face-to-face time during this encounter.   Jeanie Sewer, NP [place] [phone] (phone) [phone] (fax)  [place]

## 2021-02-23 NOTE — Telephone Encounter (Signed)
Error

## 2021-02-23 NOTE — Telephone Encounter (Signed)
Patient is scheduled for today with [facility].

## 2021-02-23 NOTE — Telephone Encounter (Addendum)
See if pt can do 4"40 virtually today per [facility].

## 2021-02-25 ENCOUNTER — Other Ambulatory Visit: Payer: Self-pay | Admitting: Family Medicine

## 2021-02-26 ENCOUNTER — Ambulatory Visit: Payer: Self-pay | Admitting: Surgery

## 2021-03-03 ENCOUNTER — Other Ambulatory Visit: Payer: Self-pay

## 2021-03-03 ENCOUNTER — Telehealth: Payer: Medicare PPO | Admitting: Family Medicine

## 2021-03-03 NOTE — Progress Notes (Signed)
Visit was rescheduled until the next day

## 2021-03-03 NOTE — Patient Instructions (Signed)
Health Maintenance Due  Topic Date Due   INFLUENZA VACCINE  - patient will hold off on getting this vaccination until improved with current COVID-19 infection.  12/08/2020    Recommended follow up: No follow-ups on file.

## 2021-03-04 ENCOUNTER — Encounter: Payer: Self-pay | Admitting: Family Medicine

## 2021-03-04 ENCOUNTER — Ambulatory Visit: Payer: Medicare PPO | Admitting: Family Medicine

## 2021-03-04 VITALS — BP 140/80 | HR 82 | Ht 72.01 in | Wt 247.0 lb

## 2021-03-04 DIAGNOSIS — N390 Urinary tract infection, site not specified: Secondary | ICD-10-CM | POA: Diagnosis not present

## 2021-03-04 DIAGNOSIS — F3342 Major depressive disorder, recurrent, in full remission: Secondary | ICD-10-CM | POA: Diagnosis not present

## 2021-03-04 DIAGNOSIS — E119 Type 2 diabetes mellitus without complications: Secondary | ICD-10-CM

## 2021-03-04 DIAGNOSIS — R300 Dysuria: Secondary | ICD-10-CM

## 2021-03-04 MED ORDER — DESVENLAFAXINE SUCCINATE ER 50 MG PO TB24: 50.0000 mg | ORAL_TABLET | Freq: Every day | ORAL | 3 refills | Status: DC

## 2021-03-04 NOTE — Progress Notes (Signed)
Phone [phone] In person visit   Subjective:   [name] is a 80 y.o. year old very pleasant female patient who presents for/with See problem oriented charting Chief Complaint  Patient presents with   Urinary Tract Infection    Pt still having urgency and burning when urinating     This visit occurred during the SARS-CoV-2 public health emergency.  Safety protocols were in place, including screening questions prior to the visit, additional usage of staff PPE, and extensive cleaning of exam room while observing appropriate contact time as indicated for disinfecting solutions.   Past Medical History-  Patient Active Problem List   Diagnosis Date Noted   Adenomatous polyp 12/20/2018    Priority: 2.   Personal history of colonic polyps 05/18/2017    Priority: 2.   Aortic atherosclerosis (HCC) 02/16/2016    Priority: 2.   Solitary pulmonary nodule 01/08/2016    Priority: 2.   IBS (irritable bowel syndrome) 07/17/2014    Priority: 2.   Hypertension associated with diabetes (HCC) 01/03/2014    Priority: 2.   Primary open-angle glaucoma 08/02/2012    Priority: 2.   Fatty liver 03/18/2009    Priority: 2.   Hyperlipidemia associated with type 2 diabetes mellitus (HCC) 07/26/2007    Priority: 2.   ANEMIA, B12 DEFICIENCY 12/29/2006    Priority: 2.   RESTLESS LEG SYNDROME, MILD 12/28/2006    Priority: 2.   Osteoporosis 12/12/2006    Priority: 2.   History of total knee replacement, bilateral 11/14/2017    Priority: 3.   Arthritis of midfoot 03/03/2016    Priority: 3.   Hammertoes of both feet 03/03/2016    Priority: 3.   Posterior tibial tendon dysfunction 01/03/2014    Priority: 3.   Osteoarthritis, knee 01/03/2014    Priority: 3.   Glaucoma 01/03/2014    Priority: 3.   Obesity (BMI 30-39.9) 06/15/2013    Priority: 3.   Foot tendinitis 07/20/2010    Priority: 3.   INSOMNIA, CHRONIC 07/25/2009    Priority: 3.   GERD 12/25/2007    Priority: 3.    NEUROPATHY, IDIOPATHIC PERIPHERAL NEC 12/28/2006    Priority: 3.   BREAST CANCER, HX OF 12/12/2006    Priority: 3.   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 11/22/2019    Priority: High   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    Priority: High   Bronchiectasis (HCC) 10/18/2015    Priority: High   Diabetes mellitus type II, controlled (HCC) 12/28/2006    Priority: High   Recurrent major depression in full remission (HCC) 01/03/2014    Priority: Medium    Chronic UTI 02/23/2021   Heart palpitations 02/11/2021   Ankle edema, bilateral 02/11/2021   Hoarseness 10/17/2020   Healthcare maintenance 02/22/2019   Physical deconditioning 02/22/2019   SOB (shortness of breath) on exertion 02/22/2019   Pain due to onychomycosis of toenails of both feet 11/08/2018   Diabetic neuropathy (HCC) 11/08/2018    Medications- reviewed and updated Current Outpatient Medications  Medication Sig Dispense Refill   ACCU-CHEK GUIDE test strip USE 1 TO TEST UP TO TWICE DAILY 200 each 0   atenolol (TENORMIN) 25 MG tablet Take 1 tablet by mouth once daily 90 tablet 3   Bacillus Coagulans-Inulin (ALIGN PREBIOTIC-PROBIOTIC PO) Take 1 capsule by mouth daily.      blood glucose meter kit and supplies KIT Dispense based on patient and insurance preference. Use up to four times daily as directed. Dx E11.9  1 each 0   budesonide-formoterol (SYMBICORT) 80-4.5 MCG/ACT inhaler Inhale 2 puffs into the lungs 2 (two) times daily. 1 each 3   cetirizine (ZYRTEC) 10 MG tablet Take 10 mg by mouth every morning.      Cholecalciferol (VITAMIN D3) 125 MCG (5000 UT) TABS Take 5,000 Units by mouth daily.     Coenzyme Q10 (COQ10) 200 MG CAPS Take 200 mg by mouth daily.     colesevelam (WELCHOL) 625 MG tablet TAKE 1 TABLET BY MOUTH ONCE DAILY IN THE MORNING AND AT NOON AND AT BEDTIME 90 tablet 3   desvenlafaxine (PRISTIQ) 50 MG 24 hr tablet Take 1 tablet (50 mg total) by mouth daily. 90 tablet 3   dicyclomine (BENTYL) 20 MG  tablet Take 1 tablet (20 mg total) by mouth in the morning, at noon, in the evening, and at bedtime. 120 tablet 5   dorzolamide-timolol (COSOPT) 22.3-6.8 MG/ML ophthalmic solution Place 1 drop into both eyes 2 (two) times daily.      ESTRING 2 MG vaginal ring INSERT 1 RING  INTO VAGINA EVERY 3 MONTHS 1 each 4   FIBER PO Take 3 tablets by mouth every evening.     fluconazole (DIFLUCAN) 150 MG tablet Take 1 tablet (150 mg total) by mouth once as needed for up to 1 dose. 1 tablet 5   gabapentin (NEURONTIN) 100 MG capsule TAKE 1 CAPSULE BY MOUTH THREE TIMES DAILY AS NEEDED FOR  NEUROPATHIC  PAIN. 270 capsule 3   glimepiride (AMARYL) 4 MG tablet TAKE 2 TABLETS BY MOUTH ONCE DAILY WITH BREAKFAST 180 tablet 0   glucose blood (ACCU-CHEK GUIDE) test strip USE 1 ONCE DAILY TO TEST BLOOD SUGAR 100 each 0   insulin degludec (TRESIBA) 200 UNIT/ML FlexTouch Pen Inject 60-80 Units into the skin in the morning. 4 mL 11   Insulin Pen Needle (NOVOFINE PLUS PEN NEEDLE) 32G X 4 MM MISC 1 each by Does not apply route daily. 30 each 5   Lancets Misc. (ACCU-CHEK FASTCLIX LANCET) KIT Use to test blood sugars daily. Dx: E11.9 1 kit 5   latanoprost (XALATAN) 0.005 % ophthalmic solution Place 1 drop into the right eye at bedtime.      lisinopril (ZESTRIL) 10 MG tablet Take 1 tablet (10 mg total) by mouth daily. 90 tablet 3   lovastatin (MEVACOR) 10 MG tablet Take 1 tablet (10 mg total) by mouth daily. 90 tablet 3   Misc Natural Products (NEURIVA PO) Take by mouth.     Multiple Vitamin (MULTIVITAMIN WITH MINERALS) TABS tablet Take 1 tablet by mouth daily. Centrum Silver     pantoprazole (PROTONIX) 40 MG tablet Take 1 tablet (40 mg total) by mouth daily. Take 1 hour around food. 30 tablet 3   pramipexole (MIRAPEX) 0.5 MG tablet TAKE 1 TABLET BY MOUTH AT BEDTIME 90 tablet 0   Respiratory Therapy Supplies (FLUTTER) DEVI Use as directed 1 each 0   ROCKLATAN 0.02-0.005 % SOLN Place 1 drop into the left eye at bedtime.       Selenium 200 MCG CAPS Take 200 mcg by mouth at bedtime.      sulfamethoxazole-trimethoprim (BACTRIM) 400-80 MG tablet Take 1 tablet by mouth daily. 90 tablet 1   temazepam (RESTORIL) 15 MG capsule TAKE 1 CAPSULE BY MOUTH AT BEDTIME AS NEEDED FOR SLEEP 30 capsule 5   UNABLE TO FIND Fiber therapy     vitamin B-12 (CYANOCOBALAMIN) 1000 MCG tablet Take 1,000 mcg by mouth daily.  No current facility-administered medications for this visit.     Objective:  BP 140/80   Pulse 82   Ht 6' 0.01" (1.829 m)   Wt 247 lb (112 kg)   LMP 05/10/1992 Comment: partial  SpO2 92%   BMI 33.49 kg/m  Gen: NAD, resting comfortably CV: RRR no murmurs rubs or gallops Lungs: CTAB no crackles, wheeze, rhonchi Abdomen: soft/nontender/nondistended/normal bowel sounds. No rebound or guarding.  Ext: no edema Skin: warm, dry     Assessment and Plan   #Concern for UTI with dysuria and incontinence S: Patient states she has a strong urge to urinate and burns when it comes out. Will wake up with incontinence and that is not normal for her.  - she has an anal fissure and reports that is larger now. Saw stephanie Hudnell- she took 2 pills of bactrim for 3 days and back to daily after that for prevention but held for last 30 hours at least in case she needed urine. No improvement in symptoms. Wearing diaper now  Exploratory surgery about fistula on November 10th.  History of surgery for this A/P: Patient wanted to do a virtual visit last week for potential UTI-we do not have urine sample from that time and is having persistent symptoms despite short-term increase in Bactrim-we will get a urine culture today  Recommended considering follow-up with urogynecology but patient would like to first do her exploratory surgery on November 10-I think that is a reasonable choice-perhaps if she has an active UTI that is not covered by Bactrim we can give her short-term reprieve from her symptoms with alternate antibiotic and then  return to Bactrim but I am not confident in ability to prevent UTIs until there is a clear permanent solution to fistula Patient to follow up if new or worsening symptoms or failure to improve.   # Depression S: Medication: Patient was not tolerating Pristiq 100 mg well and did not significantly improve depression-she wanted to switch back to Prozac with plan of eventually adding Wellbutrin to see if this would help her.  She thought pristiq was making her sleepy but when changed to prozac that really was not the case and she has reverted back to the pristiq on $RemoveBe'50mg'QhqhCNZjK$ - has not made change to $RemoveB'100mg'gZEQVDeu$  yet but already nothing some improvement with switching back. Feels less irritable on this- more optimistic.  Depression screen [facility] 2/9 03/04/2021 03/04/2021 02/23/2021  Decreased Interest 1 0 0  Down, Depressed, Hopeless 1 0 0  PHQ - 2 Score 2 0 0  Altered sleeping 0 - -  Tired, decreased energy 2 - -  Change in appetite 0 - -  Feeling bad or failure about yourself  0 - -  Trouble concentrating 0 - -  Moving slowly or fidgety/restless 0 - -  Suicidal thoughts 0 - -  PHQ-9 Score 4 - -  Difficult doing work/chores Somewhat difficult - -  Some recent data might be hidden  A/P: Significant improvement in PHQ-9-doing well on Pristiq 50 mg and we opted to simply continue this dose-to go back up to 100 mg if needed in the future.  She probably noted by comparison improvement on Pristiq over Prozac  # Diabetes S: Medication: Tresiba 75 units today- after was 165.  Glimepiride 8 mg. - having a tough time cutting out her ice cream which doesn't help.  -metformin upsets stomach -jardiance not a good choice with utis CBGs- no blood sugars under 8- has not been able to get under  120 Lab Results  Component Value Date   HGBA1C 8.5 (H) 12/18/2020   HGBA1C 8.3 (H) 08/11/2020   HGBA1C 7.4 (H) 04/21/2020   A/P: Poor control at last visit but anticipate improving with increasing insulin-for now continue slow  titration of Tresiba-she increases by 1 unit if blood sugar over 120 for 3 days in a row - consider ozempic if a1c not at goal by November visit-she is potentially interested in this though I wonder how she would do with her IBS-gave a handout for her to review  #Mild elevation of blood pressure-recheck at next visit as previously well controlled  BP Readings from Last 3 Encounters:  03/04/21 140/80  02/11/21 117/74  12/31/20 134/76     Recommended follow up: Keep November visit Future Appointments  Date Time Provider [place]  03/09/2021  2:00 PM [facility] PAT 1 [facility] None  03/30/2021  1:20 PM Marin Olp, MD [facility]  04/13/2021  1:15 PM Megan Salon, MD [facility]    Lab/Order associations:   ICD-10-CM   1. Dysuria  R30.0 Urine Culture    2. Recurrent UTI  N39.0     3. Controlled type 2 diabetes mellitus without complication, without long-term current use of insulin (HCC)  E11.9     4. Recurrent major depression in full remission ([place])  F33.42       Meds ordered this encounter  Medications   desvenlafaxine (PRISTIQ) 50 MG 24 hr tablet    Sig: Take 1 tablet (50 mg total) by mouth daily.    Dispense:  90 tablet    Refill:  3   I,Jada Bradford,acting as a scribe for [name], MD.,have documented all relevant documentation on the behalf of [name], MD,as directed by  [name], MD while in the presence of [name], MD.  I, [name], MD, have reviewed all documentation for this visit. The documentation on 03/04/21 for the exam, diagnosis, procedures, and orders are all accurate and complete.  Return precautions advised.  [name], MD

## 2021-03-04 NOTE — Patient Instructions (Addendum)
Health Maintenance Due  Topic Date Due   INFLUENZA VACCINE -   Team please log this- [facility] at [facility]  12/08/2020   Since you are doing so well on Pristiq 50 mg, we will continue this! I sent in refill  Great job working up on the insulin- long term goal sugar under 120 in the morning -considering ozempic depending on next a1c  Please stop by lab before you go- just for uine If you have mychart- we will send your results within 3 business days of [name] receiving them.  If you do not have mychart- we will call you about results within 5 business days of [name] receiving them.  *please also note that you will see labs on mychart as soon as they post. I will later go in and write notes on them- will say "notes from Dr. Yong Channel"  Recommended follow up: keep November visit

## 2021-03-05 NOTE — Patient Instructions (Addendum)
DUE TO COVID-19 ONLY ONE VISITOR IS ALLOWED TO COME WITH YOU AND STAY IN THE WAITING ROOM ONLY DURING PRE OP AND PROCEDURE DAY OF SURGERY IF YOU ARE GOING HOME AFTER SURGERY. IF YOU ARE SPENDING THE NIGHT 2 PEOPLE MAY VISIT WITH YOU IN YOUR PRIVATE ROOM AFTER SURGERY UNTIL VISITING  HOURS ARE OVER AT 800 PM AND THE 2 VISITORS CANNOT SPEND THE NIGHT.                 [name]     Your procedure is scheduled on: 03/19/21   Report to [facility] Main  Entrance   Report to Short stay at 5:15 AM     Call this number if you have problems the morning of surgery [phone]    Follow any instructions for a bowel prep from Dr's office   No food after midnight.     At 4:00 AM drink pre surgery drink.   Nothing by mouth after 4:30 AM.         BRUSH YOUR TEETH MORNING OF SURGERY AND RINSE YOUR MOUTH OUT, NO CHEWING GUM CANDY OR MINTS.     Take these medicines the morning of surgery with A SIP OF WATER: Pristiq, Pramipexole, Atenolol, Pantoprazole  How to Manage Your Diabetes Before and After Surgery  Why is it important to control my blood sugar before and after surgery? Improving blood sugar levels before and after surgery helps healing and can limit problems. A way of improving blood sugar control is eating a healthy diet by:  Eating less sugar and carbohydrates  Increasing activity/exercise  Talking with your doctor about reaching your blood sugar goals High blood sugars (greater than 180 mg/dL) can raise your risk of infections and slow your recovery, so you will need to focus on controlling your diabetes during the weeks before surgery. Make sure that the doctor who takes care of your diabetes knows about your planned surgery including the date and location.  How do I manage my blood sugar before surgery? Check your blood sugar at least 4 times a day, starting 2 days before surgery, to make sure that the level is not too high or low. Check your blood sugar  the morning of your surgery when you wake up and every 2 hours until you get to the Short Stay unit. If your blood sugar is less than 70 mg/dL, you will need to treat for low blood sugar: Do not take insulin. Treat a low blood sugar (less than 70 mg/dL) with  cup of clear juice (cranberry or apple), 4 glucose tablets, OR glucose gel. Recheck blood sugar in 15 minutes after treatment (to make sure it is greater than 70 mg/dL). If your blood sugar is not greater than 70 mg/dL on recheck, call [phone] for further instructions. Report your blood sugar to the short stay nurse when you get to Short Stay.  If you are admitted to the hospital after surgery: Your blood sugar will be checked by the staff and you will probably be given insulin after surgery (instead of oral diabetes medicines) to make sure you have good blood sugar levels. The goal for blood sugar control after surgery is 80-180 mg/dL.   WHAT DO I DO ABOUT MY DIABETES MEDICATION?  Do not take oral diabetes medicines (pills) the morning of surgery. . Take normal   am dose of Tresiba on day before surgery.     Day of surgery take 37 units of Tresiba in AM  You may not have any metal on your body including hair pins and              piercings  Do not wear jewelry, make-up, lotions, powders or perfumes, deodorant             Do not wear nail polish on your fingernails.  Do not shave  48 hours prior to surgery.                 Do not bring valuables to the hospital. [place].  Contacts, dentures or bridgework may not be worn into surgery.       Patients discharged the day of surgery will not be allowed to drive home.  IF YOU ARE HAVING SURGERY AND GOING HOME THE SAME DAY, YOU MUST HAVE AN ADULT TO DRIVE YOU HOME AND BE WITH YOU FOR 24 HOURS. YOU MAY GO HOME BY TAXI OR UBER OR ORTHERWISE, BUT AN ADULT MUST ACCOMPANY YOU HOME AND STAY WITH YOU FOR 24  HOURS.  Name and phone number of your driver:  Special Instructions: N/A              Please read over the following fact sheets you were given: _____________________________________________________________________             [facility] - Preparing for Surgery Before surgery, you can play an important role.  Because skin is not sterile, your skin needs to be as free of germs as possible.  You can reduce the number of germs on your skin by washing with CHG (chlorahexidine gluconate) soap before surgery.  CHG is an antiseptic cleaner which kills germs and bonds with the skin to continue killing germs even after washing. Please DO NOT use if you have an allergy to CHG or antibacterial soaps.  If your skin becomes reddened/irritated stop using the CHG and inform your nurse when you arrive at Short Stay. Do not shave (including legs and underarms) for at least 48 hours prior to the first CHG shower. Please follow these instructions carefully:  1.  Shower with CHG Soap the night before surgery and the  morning of Surgery.  2.  If you choose to wash your hair, wash your hair first as usual with your  normal  shampoo.  3.  After you shampoo, rinse your hair and body thoroughly to remove the  shampoo.                            4.  Use CHG as you would any other liquid soap.  You can apply chg directly  to the skin and wash                       Gently with a scrungie or clean washcloth.  5.  Apply the CHG Soap to your body ONLY FROM THE NECK DOWN.   Do not use on face/ open                           Wound or open sores. Avoid contact with eyes, ears mouth and genitals (private parts).                       Wash face,  Genitals (private parts) with your  normal soap.             6.  Wash thoroughly, paying special attention to the area where your surgery  will be performed.  7.  Thoroughly rinse your body with warm water from the neck down.  8.  DO NOT shower/wash with your normal soap after using  and rinsing off  the CHG Soap.                9.  Pat yourself dry with a clean towel.            10.  Wear clean pajamas.            11.  Place clean sheets on your bed the night of your first shower and do not  sleep with pets. Day of Surgery : Do not apply any lotions/deodorants the morning of surgery.  Please wear clean clothes to the hospital/surgery center.  FAILURE TO FOLLOW THESE INSTRUCTIONS MAY RESULT IN THE CANCELLATION OF YOUR SURGERY PATIENT SIGNATURE_________________________________  NURSE SIGNATURE__________________________________  ________________________________________________________________________

## 2021-03-06 ENCOUNTER — Other Ambulatory Visit: Payer: Self-pay | Admitting: Family Medicine

## 2021-03-06 LAB — URINE CULTURE
MICRO NUMBER:: 12554156
SPECIMEN QUALITY:: ADEQUATE

## 2021-03-06 MED ORDER — AMOXICILLIN-POT CLAVULANATE 875-125 MG PO TABS: 1.0000 | ORAL_TABLET | Freq: Two times a day (BID) | ORAL | 0 refills | Status: AC

## 2021-03-09 ENCOUNTER — Other Ambulatory Visit: Payer: Self-pay

## 2021-03-09 ENCOUNTER — Encounter (HOSPITAL_COMMUNITY): Payer: Self-pay

## 2021-03-09 ENCOUNTER — Encounter (HOSPITAL_COMMUNITY)
Admission: RE | Admit: 2021-03-09 | Discharge: 2021-03-09 | Disposition: A | Discharge status: Home / Self Care | Payer: Medicare PPO | Source: Ambulatory Visit | Attending: Surgery | Admitting: Surgery

## 2021-03-09 VITALS — BP 147/56 | HR 83 | Temp 98.7°F | Resp 20 | Ht 72.0 in | Wt 247.0 lb

## 2021-03-09 DIAGNOSIS — E119 Type 2 diabetes mellitus without complications: Secondary | ICD-10-CM | POA: Insufficient documentation

## 2021-03-09 DIAGNOSIS — Z01818 Encounter for other preprocedural examination: Secondary | ICD-10-CM | POA: Diagnosis not present

## 2021-03-09 LAB — CBC
HCT: 43.9 % (ref 36.0–46.0)
Hemoglobin: 13.6 g/dL (ref 12.0–15.0)
MCH: 29.1 pg (ref 26.0–34.0)
MCHC: 31 g/dL (ref 30.0–36.0)
MCV: 93.8 fL (ref 80.0–100.0)
Platelets: 240 10*3/uL (ref 150–400)
RBC: 4.68 MIL/uL (ref 3.87–5.11)
RDW: 13.7 % (ref 11.5–15.5)
WBC: 10.5 10*3/uL (ref 4.0–10.5)
nRBC: 0 % (ref 0.0–0.2)

## 2021-03-09 LAB — BASIC METABOLIC PANEL
Anion gap: 8 (ref 5–15)
BUN: 16 mg/dL (ref 8–23)
CO2: 31 mmol/L (ref 22–32)
Calcium: 9.3 mg/dL (ref 8.9–10.3)
Chloride: 101 mmol/L (ref 98–111)
Creatinine, Ser: 0.63 mg/dL (ref 0.44–1.00)
GFR, Estimated: >60 mL/min (ref >60)
Glucose, Bld: 172 mg/dL — ABNORMAL HIGH (ref 70–99)
Potassium: 4.3 mmol/L (ref 3.5–5.1)
Sodium: 140 mmol/L (ref 135–145)

## 2021-03-09 LAB — HEMOGLOBIN A1C
Hgb A1c MFr Bld: 8.4 % — ABNORMAL HIGH (ref 4.8–5.6)
Mean Plasma Glucose: 194.38 mg/dL

## 2021-03-09 LAB — GLUCOSE, CAPILLARY: Glucose-Capillary: 171 mg/dL — ABNORMAL HIGH (ref 70–99)

## 2021-03-09 NOTE — Progress Notes (Signed)
COVID test- NA    PCP - Dr. Ansel Bong L[facility] 03/04/21 Cardiologist - Dr. Derl Barrow Pulmonary- Dr. Lamonte Sakai [facility] 12/31/20  Chest x-ray - no EKG - 03/09/21-chart Stress Test - no ECHO - 11/27/20-epic Cardiac Cath - none Pacemaker/ICD device last checked:NA  Sleep Study - yes- negative CPAP - no  Fasting Blood Sugar - 145-175 Checks Blood Sugar _QD____ times a day  Blood Thinner Instructions:na Aspirin Instructions: Last Dose:  Anesthesia review: yes  Patient denies shortness of breath, fever, cough and chest pain at PAT appointment Pt has SOB with most activities.She needs her husband's help walking at home with a cane. The house is too "cluttered" to get a wheelchair in it.  Patient verbalized understanding of instructions that were given to them at the PAT appointment. Patient was also instructed that they will need to review over the PAT instructions again at home before surgery. Yes

## 2021-03-10 ENCOUNTER — Encounter: Payer: Self-pay | Admitting: Family Medicine

## 2021-03-18 ENCOUNTER — Encounter (HOSPITAL_COMMUNITY): Payer: Self-pay | Admitting: Surgery

## 2021-03-18 NOTE — Anesthesia Preprocedure Evaluation (Addendum)
Anesthesia Evaluation  Patient identified by MRN, date of birth, ID band Patient awake    Reviewed: Allergy & Precautions, H&P , NPO status , Patient's Chart, lab work & pertinent test results, reviewed documented beta blocker date and time   Airway Mallampati: II  TM Distance: >3 FB Neck ROM: Full    Dental no notable dental hx. (+) Teeth Intact, Dental Advisory Given   Pulmonary asthma ,    Pulmonary exam normal breath sounds clear to auscultation       Cardiovascular Exercise Tolerance: Good hypertension, Pt. on medications and Pt. on home beta blockers  Rhythm:Regular Rate:Normal     Neuro/Psych Anxiety Depression negative neurological ROS     GI/Hepatic Neg liver ROS, GERD  Medicated,  Endo/Other  diabetes, Oral Hypoglycemic Agents, Insulin Dependent  Renal/GU negative Renal ROS  negative genitourinary   Musculoskeletal  (+) Arthritis , Osteoarthritis,    Abdominal   Peds  Hematology  (+) Blood dyscrasia, anemia ,   Anesthesia Other Findings   Reproductive/Obstetrics negative OB ROS                            Anesthesia Physical Anesthesia Plan  ASA: 3  Anesthesia Plan: General   Post-op Pain Management:    Induction: Intravenous  PONV Risk Score and Plan: 4 or greater and Ondansetron, Dexamethasone and Treatment may vary due to age or medical condition  Airway Management Planned: Oral ETT  Additional Equipment:   Intra-op Plan:   Post-operative Plan: Extubation in OR  Informed Consent: I have reviewed the patients History and Physical, chart, labs and discussed the procedure including the risks, benefits and alternatives for the proposed anesthesia with the patient or authorized representative who has indicated his/her understanding and acceptance.     Dental advisory given  Plan Discussed with: CRNA  Anesthesia Plan Comments:        Anesthesia Quick  Evaluation

## 2021-03-19 ENCOUNTER — Encounter (HOSPITAL_COMMUNITY): Payer: Self-pay | Admitting: Surgery

## 2021-03-19 ENCOUNTER — Ambulatory Visit (HOSPITAL_COMMUNITY): Payer: Medicare PPO | Admitting: Anesthesiology

## 2021-03-19 ENCOUNTER — Ambulatory Visit (HOSPITAL_COMMUNITY)
Admission: RE | Admit: 2021-03-19 | Discharge: 2021-03-19 | Disposition: A | Discharge status: Home / Self Care | Payer: Medicare PPO | Source: Home / Self Care | Attending: Surgery | Admitting: Surgery

## 2021-03-19 ENCOUNTER — Ambulatory Visit (HOSPITAL_COMMUNITY): Payer: Medicare PPO | Admitting: Physician Assistant

## 2021-03-19 ENCOUNTER — Encounter (HOSPITAL_COMMUNITY)
Admission: RE | Disposition: A | Discharge status: Home / Self Care | Payer: Self-pay | Source: Home / Self Care | Attending: Surgery

## 2021-03-19 DIAGNOSIS — G2581 Restless legs syndrome: Secondary | ICD-10-CM | POA: Diagnosis present

## 2021-03-19 DIAGNOSIS — Z79899 Other long term (current) drug therapy: Secondary | ICD-10-CM | POA: Insufficient documentation

## 2021-03-19 DIAGNOSIS — E669 Obesity, unspecified: Secondary | ICD-10-CM | POA: Diagnosis present

## 2021-03-19 DIAGNOSIS — Z794 Long term (current) use of insulin: Secondary | ICD-10-CM | POA: Insufficient documentation

## 2021-03-19 DIAGNOSIS — J189 Pneumonia, unspecified organism: Secondary | ICD-10-CM | POA: Diagnosis present

## 2021-03-19 DIAGNOSIS — M810 Age-related osteoporosis without current pathological fracture: Secondary | ICD-10-CM | POA: Diagnosis not present

## 2021-03-19 DIAGNOSIS — K644 Residual hemorrhoidal skin tags: Secondary | ICD-10-CM | POA: Diagnosis present

## 2021-03-19 DIAGNOSIS — K604 Rectal fistula: Secondary | ICD-10-CM | POA: Insufficient documentation

## 2021-03-19 DIAGNOSIS — E119 Type 2 diabetes mellitus without complications: Secondary | ICD-10-CM | POA: Diagnosis present

## 2021-03-19 DIAGNOSIS — Z96612 Presence of left artificial shoulder joint: Secondary | ICD-10-CM | POA: Diagnosis present

## 2021-03-19 DIAGNOSIS — E785 Hyperlipidemia, unspecified: Secondary | ICD-10-CM | POA: Diagnosis present

## 2021-03-19 DIAGNOSIS — J9601 Acute respiratory failure with hypoxia: Secondary | ICD-10-CM | POA: Diagnosis present

## 2021-03-19 DIAGNOSIS — R278 Other lack of coordination: Secondary | ICD-10-CM | POA: Diagnosis not present

## 2021-03-19 DIAGNOSIS — W07XXXA Fall from chair, initial encounter: Secondary | ICD-10-CM | POA: Diagnosis present

## 2021-03-19 DIAGNOSIS — M7989 Other specified soft tissue disorders: Secondary | ICD-10-CM | POA: Diagnosis not present

## 2021-03-19 DIAGNOSIS — R0602 Shortness of breath: Secondary | ICD-10-CM | POA: Diagnosis not present

## 2021-03-19 DIAGNOSIS — M8580 Other specified disorders of bone density and structure, unspecified site: Secondary | ICD-10-CM | POA: Diagnosis present

## 2021-03-19 DIAGNOSIS — M25561 Pain in right knee: Secondary | ICD-10-CM | POA: Diagnosis not present

## 2021-03-19 DIAGNOSIS — R000 Tachycardia, unspecified: Secondary | ICD-10-CM | POA: Diagnosis not present

## 2021-03-19 DIAGNOSIS — Z7984 Long term (current) use of oral hypoglycemic drugs: Secondary | ICD-10-CM | POA: Insufficient documentation

## 2021-03-19 DIAGNOSIS — I10 Essential (primary) hypertension: Secondary | ICD-10-CM | POA: Diagnosis present

## 2021-03-19 DIAGNOSIS — R159 Full incontinence of feces: Secondary | ICD-10-CM | POA: Diagnosis present

## 2021-03-19 DIAGNOSIS — E890 Postprocedural hypothyroidism: Secondary | ICD-10-CM | POA: Diagnosis present

## 2021-03-19 DIAGNOSIS — M6281 Muscle weakness (generalized): Secondary | ICD-10-CM | POA: Diagnosis not present

## 2021-03-19 DIAGNOSIS — J45909 Unspecified asthma, uncomplicated: Secondary | ICD-10-CM | POA: Diagnosis present

## 2021-03-19 DIAGNOSIS — Z471 Aftercare following joint replacement surgery: Secondary | ICD-10-CM | POA: Diagnosis not present

## 2021-03-19 DIAGNOSIS — R338 Other retention of urine: Secondary | ICD-10-CM | POA: Diagnosis not present

## 2021-03-19 DIAGNOSIS — Z20822 Contact with and (suspected) exposure to covid-19: Secondary | ICD-10-CM | POA: Diagnosis present

## 2021-03-19 DIAGNOSIS — K219 Gastro-esophageal reflux disease without esophagitis: Secondary | ICD-10-CM | POA: Diagnosis not present

## 2021-03-19 DIAGNOSIS — M25461 Effusion, right knee: Secondary | ICD-10-CM | POA: Diagnosis present

## 2021-03-19 DIAGNOSIS — Z6836 Body mass index (BMI) 36.0-36.9, adult: Secondary | ICD-10-CM | POA: Diagnosis not present

## 2021-03-19 DIAGNOSIS — J9811 Atelectasis: Secondary | ICD-10-CM | POA: Diagnosis not present

## 2021-03-19 DIAGNOSIS — N179 Acute kidney failure, unspecified: Secondary | ICD-10-CM | POA: Diagnosis present

## 2021-03-19 DIAGNOSIS — K605 Anorectal fistula: Secondary | ICD-10-CM | POA: Diagnosis present

## 2021-03-19 DIAGNOSIS — E1142 Type 2 diabetes mellitus with diabetic polyneuropathy: Secondary | ICD-10-CM | POA: Diagnosis present

## 2021-03-19 DIAGNOSIS — F419 Anxiety disorder, unspecified: Secondary | ICD-10-CM | POA: Diagnosis present

## 2021-03-19 DIAGNOSIS — Z96651 Presence of right artificial knee joint: Secondary | ICD-10-CM | POA: Diagnosis not present

## 2021-03-19 DIAGNOSIS — Z96653 Presence of artificial knee joint, bilateral: Secondary | ICD-10-CM | POA: Diagnosis present

## 2021-03-19 DIAGNOSIS — Z7401 Bed confinement status: Secondary | ICD-10-CM | POA: Diagnosis not present

## 2021-03-19 DIAGNOSIS — M6250 Muscle wasting and atrophy, not elsewhere classified, unspecified site: Secondary | ICD-10-CM | POA: Diagnosis present

## 2021-03-19 DIAGNOSIS — W19XXXA Unspecified fall, initial encounter: Secondary | ICD-10-CM | POA: Diagnosis not present

## 2021-03-19 DIAGNOSIS — F3342 Major depressive disorder, recurrent, in full remission: Secondary | ICD-10-CM | POA: Diagnosis present

## 2021-03-19 DIAGNOSIS — R0902 Hypoxemia: Secondary | ICD-10-CM | POA: Diagnosis present

## 2021-03-19 DIAGNOSIS — H409 Unspecified glaucoma: Secondary | ICD-10-CM | POA: Diagnosis present

## 2021-03-19 DIAGNOSIS — K6289 Other specified diseases of anus and rectum: Secondary | ICD-10-CM | POA: Diagnosis not present

## 2021-03-19 HISTORY — PX: RECTAL EXAM UNDER ANESTHESIA: SHX6399

## 2021-03-19 HISTORY — PX: PLACEMENT OF SETON: SHX6029

## 2021-03-19 LAB — GLUCOSE, CAPILLARY
Glucose-Capillary: 155 mg/dL — ABNORMAL HIGH (ref 70–99)
Glucose-Capillary: 154 mg/dL — ABNORMAL HIGH (ref 70–99)

## 2021-03-19 SURGERY — EXAM UNDER ANESTHESIA, RECTUM
Anesthesia: General

## 2021-03-19 MED ORDER — BUPIVACAINE LIPOSOME 1.3 % IJ SUSP
INTRAMUSCULAR | Status: AC
  Filled 2021-03-19: qty 20

## 2021-03-19 MED ORDER — BUPIVACAINE LIPOSOME 1.3 % IJ SUSP: 20.0000 mL | Freq: Once | INTRAMUSCULAR | Status: DC

## 2021-03-19 MED ORDER — SUGAMMADEX SODIUM 500 MG/5ML IV SOLN
INTRAVENOUS | Status: DC | PRN
  Administered 2021-03-19: 300 mg via INTRAVENOUS

## 2021-03-19 MED ORDER — ROCURONIUM BROMIDE 10 MG/ML (PF) SYRINGE
PREFILLED_SYRINGE | INTRAVENOUS | Status: DC | PRN
  Administered 2021-03-19: 60 mg via INTRAVENOUS

## 2021-03-19 MED ORDER — GABAPENTIN 300 MG PO CAPS
300.0000 mg | ORAL_CAPSULE | ORAL | Status: AC
  Administered 2021-03-19: 300 mg via ORAL
  Filled 2021-03-19: qty 1

## 2021-03-19 MED ORDER — CHLORHEXIDINE GLUCONATE CLOTH 2 % EX PADS: 6.0000 | MEDICATED_PAD | Freq: Once | CUTANEOUS | Status: DC

## 2021-03-19 MED ORDER — ROCURONIUM BROMIDE 10 MG/ML (PF) SYRINGE
PREFILLED_SYRINGE | INTRAVENOUS | Status: AC
  Filled 2021-03-19: qty 30

## 2021-03-19 MED ORDER — EPHEDRINE 5 MG/ML INJ
INTRAVENOUS | Status: AC
  Filled 2021-03-19: qty 10

## 2021-03-19 MED ORDER — EPHEDRINE SULFATE-NACL 50-0.9 MG/10ML-% IV SOSY
PREFILLED_SYRINGE | INTRAVENOUS | Status: DC | PRN
  Administered 2021-03-19 (×8): 5 mg via INTRAVENOUS

## 2021-03-19 MED ORDER — METHYLENE BLUE 0.5 % INJ SOLN
INTRAVENOUS | Status: AC
  Filled 2021-03-19: qty 10

## 2021-03-19 MED ORDER — BUPIVACAINE-EPINEPHRINE 0.5% -1:200000 IJ SOLN
INTRAMUSCULAR | Status: DC | PRN
  Administered 2021-03-19: 30 mL

## 2021-03-19 MED ORDER — ALBUTEROL SULFATE (2.5 MG/3ML) 0.083% IN NEBU
INHALATION_SOLUTION | RESPIRATORY_TRACT | Status: AC
  Filled 2021-03-19: qty 3

## 2021-03-19 MED ORDER — FENTANYL CITRATE (PF) 100 MCG/2ML IJ SOLN
INTRAMUSCULAR | Status: DC | PRN
  Administered 2021-03-19 (×2): 50 ug via INTRAVENOUS

## 2021-03-19 MED ORDER — GENTAMICIN SULFATE 40 MG/ML IJ SOLN
5.0000 mg/kg | INTRAVENOUS | Status: AC
  Administered 2021-03-19: 440 mg via INTRAVENOUS
  Filled 2021-03-19: qty 11

## 2021-03-19 MED ORDER — 0.9 % SODIUM CHLORIDE (POUR BTL) OPTIME
TOPICAL | Status: DC | PRN
  Administered 2021-03-19: 1000 mL

## 2021-03-19 MED ORDER — HYDROCODONE-ACETAMINOPHEN 5-325 MG PO TABS: 1.0000 | ORAL_TABLET | Freq: Four times a day (QID) | ORAL | 0 refills | Status: DC | PRN

## 2021-03-19 MED ORDER — LIDOCAINE 2% (20 MG/ML) 5 ML SYRINGE
INTRAMUSCULAR | Status: DC | PRN
  Administered 2021-03-19: 60 mg via INTRAVENOUS

## 2021-03-19 MED ORDER — PROPOFOL 10 MG/ML IV BOLUS
INTRAVENOUS | Status: AC
  Filled 2021-03-19: qty 60

## 2021-03-19 MED ORDER — ACETAMINOPHEN 500 MG PO TABS
1000.0000 mg | ORAL_TABLET | ORAL | Status: AC
  Administered 2021-03-19: 1000 mg via ORAL
  Filled 2021-03-19: qty 2

## 2021-03-19 MED ORDER — DEXAMETHASONE SODIUM PHOSPHATE 10 MG/ML IJ SOLN
INTRAMUSCULAR | Status: DC | PRN
  Administered 2021-03-19: 4 mg via INTRAVENOUS

## 2021-03-19 MED ORDER — CHLORHEXIDINE GLUCONATE 0.12 % MT SOLN
15.0000 mL | Freq: Once | OROMUCOSAL | Status: AC
  Administered 2021-03-19: 15 mL via OROMUCOSAL

## 2021-03-19 MED ORDER — DEXAMETHASONE SODIUM PHOSPHATE 10 MG/ML IJ SOLN
INTRAMUSCULAR | Status: AC
  Filled 2021-03-19: qty 3

## 2021-03-19 MED ORDER — FENTANYL CITRATE PF 50 MCG/ML IJ SOSY: 25.0000 ug | PREFILLED_SYRINGE | INTRAMUSCULAR | Status: DC | PRN

## 2021-03-19 MED ORDER — ORAL CARE MOUTH RINSE: 15.0000 mL | Freq: Once | OROMUCOSAL | Status: AC

## 2021-03-19 MED ORDER — BUPIVACAINE-EPINEPHRINE (PF) 0.5% -1:200000 IJ SOLN
INTRAMUSCULAR | Status: AC
  Filled 2021-03-19: qty 30

## 2021-03-19 MED ORDER — METHYLENE BLUE 1 % INJ SOLN
INTRAVENOUS | Status: DC | PRN
  Administered 2021-03-19: 5 mL via SUBMUCOSAL

## 2021-03-19 MED ORDER — PHENYLEPHRINE 40 MCG/ML (10ML) SYRINGE FOR IV PUSH (FOR BLOOD PRESSURE SUPPORT)
PREFILLED_SYRINGE | INTRAVENOUS | Status: AC
  Filled 2021-03-19: qty 10

## 2021-03-19 MED ORDER — ONDANSETRON HCL 4 MG/2ML IJ SOLN
INTRAMUSCULAR | Status: AC
  Filled 2021-03-19: qty 6

## 2021-03-19 MED ORDER — CLINDAMYCIN PHOSPHATE 900 MG/50ML IV SOLN
900.0000 mg | INTRAVENOUS | Status: AC
  Administered 2021-03-19: 900 mg via INTRAVENOUS
  Filled 2021-03-19: qty 50

## 2021-03-19 MED ORDER — ENSURE PRE-SURGERY PO LIQD
296.0000 mL | Freq: Once | ORAL | Status: DC
  Filled 2021-03-19: qty 296

## 2021-03-19 MED ORDER — DIBUCAINE (PERIANAL) 1 % EX OINT
TOPICAL_OINTMENT | CUTANEOUS | Status: AC
  Filled 2021-03-19: qty 28

## 2021-03-19 MED ORDER — FENTANYL CITRATE (PF) 100 MCG/2ML IJ SOLN
INTRAMUSCULAR | Status: AC
  Filled 2021-03-19: qty 2

## 2021-03-19 MED ORDER — ONDANSETRON HCL 4 MG/2ML IJ SOLN
INTRAMUSCULAR | Status: DC | PRN
  Administered 2021-03-19: 4 mg via INTRAVENOUS

## 2021-03-19 MED ORDER — ALBUTEROL SULFATE (2.5 MG/3ML) 0.083% IN NEBU
2.5000 mg | INHALATION_SOLUTION | Freq: Once | RESPIRATORY_TRACT | Status: AC
  Administered 2021-03-19: 2.5 mg via RESPIRATORY_TRACT

## 2021-03-19 MED ORDER — PHENYLEPHRINE 40 MCG/ML (10ML) SYRINGE FOR IV PUSH (FOR BLOOD PRESSURE SUPPORT)
PREFILLED_SYRINGE | INTRAVENOUS | Status: DC | PRN
  Administered 2021-03-19 (×2): 80 ug via INTRAVENOUS
  Administered 2021-03-19: 40 ug via INTRAVENOUS
  Administered 2021-03-19: 80 ug via INTRAVENOUS
  Administered 2021-03-19: 40 ug via INTRAVENOUS
  Administered 2021-03-19: 80 ug via INTRAVENOUS

## 2021-03-19 MED ORDER — ACETAMINOPHEN 500 MG PO TABS: 1000.0000 mg | ORAL_TABLET | Freq: Once | ORAL | Status: DC

## 2021-03-19 MED ORDER — EPHEDRINE 5 MG/ML INJ
INTRAVENOUS | Status: AC
  Filled 2021-03-19: qty 5

## 2021-03-19 MED ORDER — PROPOFOL 10 MG/ML IV BOLUS
INTRAVENOUS | Status: DC | PRN
  Administered 2021-03-19: 80 mg via INTRAVENOUS

## 2021-03-19 MED ORDER — LACTATED RINGERS IV SOLN: INTRAVENOUS | Status: DC

## 2021-03-19 MED ORDER — BUPIVACAINE LIPOSOME 1.3 % IJ SUSP
INTRAMUSCULAR | Status: DC | PRN
  Administered 2021-03-19: 20 mL

## 2021-03-19 SURGICAL SUPPLY — 42 items
ADH SKN CLS APL DERMABOND .7 (GAUZE/BANDAGES/DRESSINGS) ×1
APL SKNCLS STERI-STRIP NONHPOA (GAUZE/BANDAGES/DRESSINGS) ×1
BAG COUNTER SPONGE SURGICOUNT (BAG) IMPLANT
BAG SPNG CNTER NS LX DISP (BAG)
BENZOIN TINCTURE PRP APPL 2/3 (GAUZE/BANDAGES/DRESSINGS) ×2 IMPLANT
BLADE SURG 15 STRL LF DISP TIS (BLADE) IMPLANT
BLADE SURG 15 STRL SS (BLADE)
CNTNR URN SCR LID CUP LEK RST (MISCELLANEOUS) ×1 IMPLANT
CONT SPEC 4OZ STRL OR WHT (MISCELLANEOUS) ×2
COVER SURGICAL LIGHT HANDLE (MISCELLANEOUS) ×2 IMPLANT
DECANTER SPIKE VIAL GLASS SM (MISCELLANEOUS) ×2 IMPLANT
DERMABOND ADVANCED (GAUZE/BANDAGES/DRESSINGS) ×1
DERMABOND ADVANCED .7 DNX12 (GAUZE/BANDAGES/DRESSINGS) ×1 IMPLANT
DRAPE LAPAROTOMY T 102X78X121 (DRAPES) ×2 IMPLANT
DRSG PAD ABDOMINAL 8X10 ST (GAUZE/BANDAGES/DRESSINGS) IMPLANT
ELECT REM PT RETURN 15FT ADLT (MISCELLANEOUS) ×2 IMPLANT
GAUZE 4X4 16PLY ~~LOC~~+RFID DBL (SPONGE) ×2 IMPLANT
GAUZE SPONGE 4X4 12PLY STRL (GAUZE/BANDAGES/DRESSINGS) ×2 IMPLANT
GLOVE SURG NEOPR MICRO LF SZ8 (GLOVE) ×2 IMPLANT
GLOVE SURG UNDER LTX SZ8 (GLOVE) ×2 IMPLANT
GOWN STRL REUS W/TWL XL LVL3 (GOWN DISPOSABLE) ×4 IMPLANT
KIT BASIN OR (CUSTOM PROCEDURE TRAY) ×2 IMPLANT
KIT TURNOVER KIT A (KITS) IMPLANT
LOOP VESSEL MAXI BLUE (MISCELLANEOUS) IMPLANT
NEEDLE HYPO 22GX1.5 SAFETY (NEEDLE) ×2 IMPLANT
PACK BASIC VI WITH GOWN DISP (CUSTOM PROCEDURE TRAY) ×2 IMPLANT
PANTS MESH DISP LRG (UNDERPADS AND DIAPERS) ×1 IMPLANT
PANTS MESH DISPOSABLE L (UNDERPADS AND DIAPERS) ×1
PENCIL SMOKE EVACUATOR (MISCELLANEOUS) IMPLANT
SHEARS HARMONIC 9CM CVD (BLADE) IMPLANT
SURGILUBE 2OZ TUBE FLIPTOP (MISCELLANEOUS) ×2 IMPLANT
SUT CHROMIC 2 0 SH (SUTURE) ×2 IMPLANT
SUT CHROMIC 3 0 SH 27 (SUTURE) IMPLANT
SUT MNCRL AB 4-0 PS2 18 (SUTURE) ×2 IMPLANT
SUT VIC AB 2-0 SH 27 (SUTURE)
SUT VIC AB 2-0 SH 27X BRD (SUTURE) IMPLANT
SUT VIC AB 2-0 UR6 27 (SUTURE) ×14 IMPLANT
SUT VIC AB 3-0 SH 18 (SUTURE) ×4 IMPLANT
SYR 20ML LL LF (SYRINGE) ×2 IMPLANT
SYR 3ML LL SCALE MARK (SYRINGE) IMPLANT
TOWEL OR 17X26 10 PK STRL BLUE (TOWEL DISPOSABLE) ×2 IMPLANT
TOWEL OR NON WOVEN STRL DISP B (DISPOSABLE) ×2 IMPLANT

## 2021-03-19 NOTE — Op Note (Signed)
03/19/2021  9:26 AM  PATIENT:  [name]  80 y.o. female  Patient Care Team: Marin Olp, MD as PCP - General (Family Medicine) Michael Boston, MD as Consulting Physician (General Surgery) Mauri Pole, MD as Consulting Physician (Gastroenterology) Jerline Pain, MD as Consulting Physician (Cardiology) Gaynelle Arabian, MD as Consulting Physician (Orthopedic Surgery) Raynelle Bring, MD as Consulting Physician (Urology) Jaquita Folds, MD as Consulting Physician (Gynecology)  PRE-OPERATIVE DIAGNOSIS:  RECURRENT PERIRECTAL WOUND, POSSIBLE RECURRENT ANAL FISTULA  POST-OPERATIVE DIAGNOSIS:  RECURRENT PERIRECTAL / PERINEAL SINUS TRACT  PROCEDURE:   ANORECTAL EXAM UNDER ANESTHESIA LIMBERG RHOMBOID ROTATIONAL FLAP CLOSURE  SURGEON:  Adin Hector, MD  ASSISTANT: OR Staff   ANESTHESIA:   General Anorectal & Local field block (0.25% bupivacaine with epinephrine mixed with Liposomal bupivacaine (Experel)    EBL:  No intake/output data recorded..  See anesthesia record  Delay start of Pharmacological VTE agent (>24hrs) due to surgical blood loss or risk of bleeding:  no  DRAINS: none   SPECIMEN:  Left anterior perirectal sinus tract  DISPOSITION OF SPECIMEN:  PATHOLOGY  COUNTS:  YES  PLAN OF CARE: Discharge to home after PACU  PATIENT DISPOSITION:  PACU - hemodynamically stable.  INDICATION: Patient with history of rectal fistula status post prior repair with recurrent cannulization requiring debridement and marsupialization.  Development of recurrent sinus tract with pain or discomfort.  She struggles with irritable bowel syndrome with a lot of diarrhea and occasional overflow fecal incontinence.  Some urine incontinence as well.  She has been working to have a better bowel regimen to control her bowel function and is got down to less than 2 bowel once a day.  However she still struggles with some drainage and leaking concerning for recurrent fistula.   I recommended repeat anorectal examination under anesthesia and possible fistula repair such as advancement flap:  The anatomy & physiology of the anorectal region was discussed.  We discussed the pathophysiology of anorectal abscess and fistula.  Differential diagnosis was discussed.  Natural history progression was discussed.   I stressed the importance of a bowel regimen to have daily soft bowel movements to minimize progression of disease.     The patient's condition is not adequately controlled.  Non-operative treatment has not healed the fistula.  Therefore, I recommended examination under anaesthesia to confirm the diagnosis and treat the fistula.  I discussed techniques that may be required such as fistulotomy, ligation by LIFT technique, and/or seton placement.  Benefits & alternatives discussed.  I noted a good likelihood this will help address the problem, but sometimes repeat operations and prolonged healing times may occur.  Risks such as bleeding, pain, recurrence, reoperation, incontinence, heart attack, death, and other risks were discussed.      Educational handouts further explaining the pathology, treatment options, and bowel regimen were given.  The patient expressed understanding & wishes to proceed.  We will work to coordinate surgery for a mutually convenient time.   OR FINDINGS: Patient had no evidence of recurrent fistula.  Chronic deep left anterior perirectal/perineal sinus tract going up to the sphincter complex but not through it.  No fistula by probe or methylene blue injection.  Rotational rhomboid Limberg flap done from the left anterior gluteal region to fill left anterior perineum  Some minor anal stricturing/stenosis but no sphincter defect.  No fissure or abscess.  No fistula.  No prolapsing hemorrhoids.  Some mild proctitis.  Few external hemorrhoidal tags left alone.  DESCRIPTION:  Informed consent was confirmed. Patient underwent general anesthesia without  difficulty. Patient was placed into prone/jacknife positioning.  The perianal region was prepped and draped in sterile fashion. Surgical timeout confirmed or plan.  I did digital rectal examination and then transitioned over to anoscopy to get a sense of the anatomy.  I did place a probe through the external opening.  He did not appear to track into the rectum.  I also injected the track with methylene blue.  With this I was able to confirm there was no evidence of any internal opening nor recurrent fistula.  There was no superficial fistula or enter/transfer enteric/super sphincteric fistula.  I inspected the rectum.  Had to dilate a little bit but was able to get a Sawyer retractor up without difficulty.  Got excellent viewing of the anal rectal region.  No evidence of any fistula information or atypical anal crypts.  No sinus or abscess.  Internal hemorrhoids grade 1-2 and not particularly inflamed.  A few small external hemorrhoid tags a few millimeters in size in the posterior anal verge.  I left these hemorrhoids alone.  The rectovaginal septum was intact.  The introitus showed no inflammation.  There is no inflamed Bartholin cyst.  There was nothing to digital or visual inspection in the posterior vaginal wall that raise concerning of some type of rectovaginal fistula or other concern.  Because she had recannulated to sinus tract and is having trouble despite 2 prior surgeries including marsupialization,  I was skeptical that redebridement would work.  Therefore decided to try and do a rotational flap to help fill in the cratered scarred inflamed tissue.  I excised the chronic sinus tract down to the peroneal floor going somewhat close to the left anterior sphincter complex.  Again confirmed no opening or abnormality.  I marked out a Limberg rhomboid flap coming from the left lateral intergluteal/perirectal region.  5 x 5 cm for the 4 x 4 centimeter wound.  Came through the dermis and subcutaneous fat  all the way down to the pelvic floor.  Tissues were viable with good bleeding at the dermis.  I rotated that flap to feel in the wound medially.  I closed this Limberg rhomboid flap using 3-0 Vicryl deep dermal interrupted sutures starting in all the corners.  Then filled the gap with interrupted deep dermal sutures.  The left lateral wound defect from where the flap did come from was trimmed at the skin and closed in a similar fashion.  This filled in and elevated to the left perirectal/perineal tissues quite well.  Flap looked viable.  I closed the skin using running 4 Monocryl suture.  Because of her history of some urinary and occasional urge incontinence with IBS I decided to cover the incision with Dermabond.  That was left in place to dry well.  Once it was well dried topical to begin ointment fluff and mesh panties were placed.  I had already done an aggressive anorectal and perineal nerve block in a field fashion.  I made an attempt to locate & reach the desired patient contact to discuss the patient's overall status and my recommendations.  Called her husband's phone twice.  No answer.  We will try and update them tomorrow when patient is more awake and alert.  Instructions are written.  Prescription sent.  Adin Hector, M.D., F.A.C.S. Gastrointestinal and Minimally Invasive Surgery [facility] [address], [place], [place] [address] [phone] Main / Paging

## 2021-03-19 NOTE — Discharge Instructions (Addendum)
##############################################  ANORECTAL SURGERY:  POST OPERATIVE INSTRUCTIONS  ######################################################################  EAT Start with a pureed / full liquid diet After 24 hours, gradually transition to a high fiber diet.    CONTROL PAIN Control pain so you can tolerate bowel movements,  walk, sleep, tolerate sneezing/coughing, and go up/down stairs.   HAVE A BOWEL MOVEMENT DAILY Keep your bowels regular to avoid problems.   Taking a fiber supplement every day to keep bowels soft.   Try a laxative to override constipation. Use an antidairrheal to slow down diarrhea.   Call if not better after 2 tries  WALK Walk an hour a day.  Control your pain to do that.   CALL IF YOU HAVE PROBLEMS/CONCERNS Call if you are still struggling despite following these instructions. Call if you have concerns not answered by these instructions  ######################################################################    Take your usually prescribed home medications unless otherwise directed.  DIET: Follow a light bland diet & liquids the first 24 hours after arrival home, such as soup, liquids, starches, etc.  Be sure to drink plenty of fluids.  Quickly advance to a usual solid diet within a few days.  Avoid fast food or heavy meals as your are more likely to get nauseated or have irregular bowels.  A low-fat, high-fiber diet for the rest of your life is ideal.  PAIN CONTROL: Pain is best controlled by a usual combination of many methods TOGETHER: Warm baths/soaks or Ice packs Over the counter pain medication Prescription pain medications Topical creams  Expect swelling and discomfort in the anus/rectal area.  Warm water baths (30-60 minutes up to 6 times a day, especially after bowel meovements) will help. Use ice for the first few days to help decrease swelling and bruising, then switch to heat such as warm towels, sitz baths, warm baths, etc to  help relax tight/sore spots and speed recovery.  Some people prefer to use ice alone, heat alone, alternating between ice & heat.  Experiment to what works for you.   It is helpful to take an over-the-counter pain medication continuously for the first few weeks.  Choose one of the following that works best for you: Naproxen (Aleve, etc)  Two 220mg tabs twice a day Ibuprofen (Advil, etc) Three 200mg tabs four times a day (every meal & bedtime) Acetaminophen (Tylenol, etc) 500-650mg four times a day (every meal & bedtime) A  prescription for pain medication (such as oxycodone, hydrocodone, etc) should be given to you upon discharge.  Take your pain medication as prescribed.  If you are having problems/concerns with the prescription medicine (does not control pain, nausea, vomiting, rash, itching, etc), please call us [phone] to see if we need to switch you to a different pain medicine that will work better for you and/or control your side effect better. If you need a refill on your pain medication, please contact your pharmacy.  They will contact our office to request authorization. Prescriptions will not be filled after 5 pm or on week-ends.  If can take up to 48 hours for it to be filled & ready so avoid waiting until you are down to thel ast pill. A topical cream (Dibucaine) or a prescription for a cream (such as diltiazem 2% gel) may be given to you.  Many people find relief with topical creams.  Some people find it burns too much.  Experiment.  If it helps, use it.  If it burns, don't using it. You also may receive a prescription for   diazepam, and a muscle relaxant to help you to be able to urinate and defecate more easily.  It is safe to take a few doses with the other medications as long as you are not planning to drive or do anything intense.  Hopefully this can minimize the chance of needing a Foley catheter into your bladder   Use a Sitz Bath 4-8 times a day for relief   Sitz Bath A  sitz bath is a warm water bath taken in the sitting position that covers only the hips and buttocks. It may be used for either healing or hygiene purposes. Sitz baths are also used to relieve pain, itching, or muscle spasms. The water may contain medicine. Moist heat will help you heal and relax.  HOME CARE INSTRUCTIONS  Take 3 to 4 sitz baths a day. Fill the bathtub half full with warm water. Sit in the water and open the drain a little. Turn on the warm water to keep the tub half full. Keep the water running constantly. Soak in the water for 15 to 20 minutes. After the sitz bath, pat the affected area dry first.   KEEP YOUR BOWELS REGULAR The goal is one soft bowel movement a day Avoid getting constipated.  Between the surgery and the pain medications, it is common to experience some constipation.  Increasing fluid intake and taking a fiber supplement (such as Metamucil, Citrucel, FiberCon, MiraLax, etc) 2-3 times a day regularly will usually help prevent this problem from occurring.  A mild laxative (prune juice, Milk of Magnesia, MiraLax, etc) should be taken according to package directions if there are no bowel movements after 48 hours. Watch out for diarrhea.  If you have many loose bowel movements, simplify your diet to bland foods & liquids for a few days.  Stop any stool softeners and decrease your fiber supplement.  Switching to mild anti-diarrheal medications (Kayopectate, Pepto Bismol) can help.  Can try an imodium/loperamide dose.  If this worsens or does not improve, please call us.  Wound Care  Remove your bandages with your first bowel movement, usually the day after surgery.  Let the gauze fall off with the first bowel movement or shower.   Wear an absorbent pad or soft cotton balls in your underwear as needed to catch any drainage and help keep the area  Keep the area clean and dry.  Bathe / shower every day.  Keep the area clean by showering / bathing over the incision / wound.    It is okay to soak an open wound to help wash it.  Consider using a squeeze bottle filled with warm water to gently wash the anal area.  Wet wipes or showers / gentle washing after bowel movements is often less traumatic than regular toilet paper. You will often notice bleeding with bowel movements.  This should slow down by the end of the first week of surgery.  Sitting on an ice pack can help. Expect some drainage.  This should slow down by the end of the first week of surgery, but you will have occasional bleeding or drainage up to a few months after surgery.  Wear an absorbent pad or soft cotton gauze in your underwear until the drainage stops.  ACTIVITIES as tolerated:   You may resume regular (light) daily activities beginning the next day--such as daily self-care, walking, climbing stairs--gradually increasing activities as tolerated.  If you can walk 30 minutes without difficulty, it is safe to try more intense activity   such as jogging, treadmill, bicycling, low-impact aerobics, swimming, etc. Save the most intensive and strenuous activity for last such as sit-ups, heavy lifting, contact sports, etc  Refrain from any heavy lifting or straining until you are off narcotics for pain control.   DO NOT PUSH THROUGH PAIN.  Let pain be your guide: If it hurts to do something, don't do it.  Pain is your body warning you to avoid that activity for another week until the pain goes down. You may drive when you are no longer taking prescription pain medication, you can comfortably sit for long periods of time, and you can safely maneuver your car and apply brakes. You may have sexual intercourse when it is comfortable.  FOLLOW UP in our office Please call [facility] at [phone] to set up an appointment to see your surgeon in the office for a follow-up appointment approximately 2-3 weeks after your surgery. Make sure that you call for this appointment the day you arrive home to ensure a convenient appointment  time.  8. IF YOU HAVE DISABILITY OR FAMILY LEAVE FORMS, BRING THEM TO THE OFFICE FOR PROCESSING.  DO NOT GIVE THEM TO YOUR DOCTOR.        WHEN TO CALL US [phone]: Poor pain control Reactions / problems with new medications (rash/itching, nausea, etc)  Fever over 101.5 F (38.5 C) Inability to urinate Nausea and/or vomiting Worsening swelling or bruising Continued bleeding from incision. Increased pain, redness, or drainage from the incision  The clinic staff is available to answer your questions during regular business hours (8:30am-5pm).  Please don't hesitate to call and ask to speak to one of our nurses for clinical concerns.   A surgeon from [facility] is always on call at the hospitals   If you have a medical emergency, go to the nearest emergency room or call 911.    [facility] [address], [address], [place], [place]  [address] ? MAIN: [phone] ? TOLL FREE: [phone] ? FAX [phone] [email or web address]  #####################################################     

## 2021-03-19 NOTE — H&P (Signed)
PROVIDER: Hollace Kinnier, MD  Patient Care Team: Marin Olp, MD as PCP - General (Family Medicine) Johney Maine, Adrian Saran, MD as Consulting Provider (General Surgery) Candee Furbish, MD (Cardiovascular Disease) Ree Shay, MD (Gastroenterology) Effie Shy., MD (Urology)  [facility] MRN: [id number] DOB: 1940-08-25 DATE OF ENCOUNTER: 01/05/2021  Interval History:   The patient returns to the office after undergoing -office I&D of perirectal abscess by Dr. Rosendo Gros April 2021 - LIFT repair of intersphincteric fistula July 2021 by me -Developed superficial recurrence requiring superficial fistulotomy and marsupialization October 2021 by me  When I saw her last 06/02/2020, she was gradually closing down - 1x1x1cm wound. I recommended 6-week follow-up.  "Something's bleeding. Something's not right" I have not seen her in over 7 months  Patient comes in wheelchair but able to get up to the examination bed with the help of her husband. She has been struggling with irregular bowels with her IBS and recurrent UTI still. She has been placed on suppressive Keflex and and that has helped. She was evaluated by pelvic floor your GYN in April 2022 and there was no mention of any fistula abscess or wound. Recommended consider a pessary. Wears diapers due to urinary incontinence. Not felt to be a good surgical candidate candidate given her diabetes and other issues.  Patient notes she had worsening diarrhea on the Keflex and now she has been switched to Bactrim. She notes a fair amount of gassiness and flatus. Loose bowels. Sometimes 5-6 times a day. Currently 2 a day. She noticed with worsening diarrhea she felt some occasional bleeding in drainage. Hard to keep the area clean and dry. Concerning.  She had a colonoscopy in 2020 by Dr. Silverio Decamp that was rather underwhelming except for one adenoma. Recommended as needed since she is 80 years old. .    Labs, Imaging and Diagnostic  Testing:  Located in [place]' section of Epic EMR chart  PRIOR NOTES   AZRIEL DANCY Appointment: 06/02/2020 3:00 PM Location: [facility] Patient #: [id number] DOB: 20-Oct-1940 Married / Language: English / Race: White Female  History of Present Illness Olivia Hector MD; 06/02/2020 3:19 PM) The patient is a 80 year old female who presents with anal fistula. Note for "Anal fistula": ` ` ` The patient returns s/p LIFT anal fistula with hemorrhoidectomy.  Excision anal polyp.  Hemorrhoidal ligation and pexy.  11/22/2019 repeat anorectal examination under anesthesia with anal fistulotomy / marsupilization 03/07/2020  Patient feeling much better.  Hoping she can "graduate".  Has had some occasional UTIs.  Mild incontinence to urine.  Not flatus or stools.  Has no drainage.  Did use a cottonball until things dried up.  Patient postponed her first postoperative appointment so she is a month out from surgery.  Denies any sharp pain now.  She is wearing diapers since she occasionally gets some anal leakage and has a history of IBS with irregular bowels.  No major accidents.  Some days the area stays rather clean.  Certainly no major drainage like she had preoperatively  She tried use gauze but it is irritating and causes bleeding when she removes it.  Still with some irregular bowels but most days are under control.  No fevers or chills.  *  SURGICAL PATHOLOGY CASE: [id number] PATIENT: [name] Surgical Pathology Report  Clinical History: Recurrent perirectal fistula (crm)  FINAL MICROSCOPIC DIAGNOSIS:  A. ANAL FISTULA, SUPERFICIAL, EXCISION: - Fragments of soft tissue lined by focally keratinized squamous mucosa  with acute and chronic inflammation, compatible with clinically stated anal fistula - No evidence of dysplasia or malignancy  Cache [place]oursey DESCRIPTION:  Received in formalin are irregular portions of tan skin and subcutaneous tissue which have an  aggregate measurement of 2.8 x 2.5 x 0.8 cm.  There is subcutaneous fibrosis.  Sections are submitted in 1 cassette.  [facility] 03/07/2020)  Final Diagnosis performed by Jaquita Folds, MD.   Electronically signed 03/10/2020 Technical and / or Professional components performed at [facility], [place] [address], [place], [place] [address]. Immunohistochemistry Technical component (if applicable) was performed at [facility] [address] [place], [place], [place] [address].   IMMUNOHISTOCHEMISTRY DISCLAIMER (if applicable): Some of these immunohistochemical stains may have been developed and the performance characteristics determine by [facility]. Some may not have been cleared or approved by the U.S. Food and Drug Administration. The FDA has determined that such clearance or approval is not necessary. This test is used for clinical purposes. It should not be regarded as investigational or for research. This laboratory is certified under the [place] (CLIA-88) as qualified to perform high complexity clinical laboratory testing.  The controls stained appropriately.  *  03/07/2020  12:49 PM  PATIENT:  [name]  80 y.o. female  Patient Care Team: Marin Olp, MD as PCP - General (Family Medicine) Michael Boston, MD as Consulting Physician (General Surgery) Mauri Pole, MD as Consulting Physician (Gastroenterology) Jerline Pain, MD as Consulting Physician (Cardiology) Gaynelle Arabian, MD as Consulting Physician (Orthopedic Surgery) Raynelle Bring, MD as Consulting Physician (Urology)  PRE-OPERATIVE DIAGNOSIS:  RECURRENT PERIRECTAL FISTULA  POST-OPERATIVE DIAGNOSIS:  RECURRENT SUPERFICIAL PERIRECTAL FISTULA  PROCEDURE: ANORECTAL EXAM UNDER ANESTHESIA ANAL FISTULOTOMY WITH MARSUPIALIZATION  SURGEON:  Olivia Hector, MD  ASSISTANT: OR  Staff  ANESTHESIA:  General Anorectal & Local field block (0.25% bupivacaine with epinephrine mixed with Liposomal bupivacaine (Experel)  EBL:  Total I/O In: 200 [IV Piggyback:200] Out: 20 [Blood:20].  See anesthesia record  Delay start of Pharmacological VTE agent (>24hrs) due to surgical blood loss or risk of bleeding:  no  DRAINS: none  SPECIMEN:  Source of Specimen:  Superficial anal canal  DISPOSITION OF SPECIMEN:  PATHOLOGY  COUNTS:  YES  PLAN OF CARE: Discharge to home after PACU  PATIENT DISPOSITION:  PACU - hemodynamically stable.  INDICATION:   Pleasant woman with intersphincteric fistula requiring LIFT repair earlier this year. Initially healing well but then developed worsening purulent drainage. Recurrent fistula suspected.  I recommended examination and surgical treatment:  The anatomy & physiology of the anorectal region was discussed.  We discussed the pathophysiology of anorectal abscess and fistula.  Differential diagnosis was discussed.  Natural history progression was discussed.   I stressed the importance of a bowel regimen to have daily soft bowel movements to minimize progression of disease.     The patient's condition is not adequately controlled.  Non-operative treatment has not healed the fistula.  Therefore, I recommended examination under anaesthesia to confirm the diagnosis and treat the fistula.  I discussed techniques that may be required such as fistulotomy, ligation by LIFT technique, and/or seton placement.  Benefits & alternatives discussed.  I noted a good likelihood this will help address the problem, but sometimes repeat operations and prolonged healing times may occur.  Risks such as bleeding, pain, recurrence, reoperation, incontinence, heart attack, death, and other risks were discussed.      Educational handouts further  explaining the pathology, treatment options, and bowel regimen were given.  The patient expressed understanding & wishes to  proceed.  We will work to coordinate surgery for a mutually convenient time.  OR FINDINGS: Patient had a superficial anal fistula.    External location LEFT ANTERIOR  about 3 cm from anal verge.  Internal location : Anterior midline analverge about 0 cm from anal verge.  DESCRIPTION:  Informed consent was confirmed. Patient underwent general anesthesia without difficulty. Patient was placed into prone / jackknife positioning.  The perianal region was prepped and draped in sterile fashion. Surgical timeout confirmed or plan.  I did digital rectal examination and then transitioned over to anoscopy to get a sense of the anatomy.  I did place a probe through the external opening it did not seem to easily probed more proximally. There seemed to be a thinned out area at the anal verge superficial to the sphincters that seem suspicious but I could not probe easily into it so I held off on more aggressive probing. Therefore, I injected the track with methylene blue.  With this I was able to locate an internal opening at the anterior midline anal verge just superficial to the sphincter complex.  No abscess located.  With methylene blue lighting the way I was able to get the probe to gently find the internal opening. I confirmed it was superficial to the sphincters. I transected through the anoderm and perirectal tissues to open up and perform fistulotomy. I excised the edges of the wound. This was in the left anterior region going up to the base of the introitus and left labia. Quite swollen and folded. I ended up excising more skin and soft tissue to have a very open broad crater wound. Because it seemed likely it could re-tunnel, I performed marsupialization around the edges of the fistulotomy wound with 3-0 chromic suture starting at the anterior midline anal verge for medial half the circumference. Then a separate running stitch on the lateral circumference. This allowed a marsupialization. I assured  hemostasis. Deepest aspect was 1 cm proximal to the sphincters but again no other tunneling or abnormalities. Sphincter complex intact with no evidence of injury or defect.  No evidence of rectovaginal septum or other concerns.  I reexamined the anal canal.   There is was no narrowing.  Hemostasis was excellent.  I repeated anoscopy and examination.  Hemostasis was good.  We placed fluff gauze to onlay over the wound.  No packing done.  Patient is being extubated go to recovery room.  I discussed operative findings, updated the patient's status, discussed probable steps to recovery, and gave postoperative recommendations to the patient's spouse.  Recommendations were made.  Questions were answered.  He expressed understanding & appreciation.  [name], M.D., F.A.C.S. Gastrointestinal and Minimally Invasive Surgery [facility] [address], [place], [place] [address] [phone] Main / Paging  ##############################################################  PRIOR NOTE: The patient returns to clinic after surgery, disappointed.  She is worried that the drainage has gotten worse.  Or purulence.  She still gets some blood when she wipes.  Some staining.  No major incontinence though.  She's been struggling with recurrent urinary tract infections.  This is been a problem for a while.  Has poor tolerance to many antibiotics.  She is waiting for a call back on about a recent urine culture to see what antibiotic she is needs to be on.  She on she has chronic urinary  incontinence and wears a diaper.  She's having loose bowel movements still struggling with her bowel and diarrhea.  She is followed by gastroenterology.  They have her on WelChol, dicyclomine, fiber.  The discuss starting her on Imodium but she was worried about constipation and so she is try to hold off.  However she is having urgency and incontinence and cannot go to church and is worried about her  quality of life.  Frustrated & disappointed  Pathology: SURGICAL PATHOLOGY CASE: [id number] PATIENT: [name] Surgical Pathology Report  Clinical History: Perirectal fistula (crm)  FINAL MICROSCOPIC DIAGNOSIS:  A. HEMORRHOID, LEFT ANTERIOR, HEMORRHOIDECTOMY: - Findings consistent with hemorrhoid. - No dysplasia or malignancy.  B. HEMORRHOID, RIGHT ANTERIOR, HEMORRHOIDECTOMY: - Findings consistent with hemorrhoid with focal inflammation. - No dysplasia or malignancy.  C. FISTULA TRACK: - Fistula tract associated with acute inflammation and granulation tissue. - No dysplasia or malignancy.  D. HEMORRHOID, RIGHT LATERAL, HEMORRHOIDECTOMY: - Findings consistent with hemorrhoid. - No dysplasia or malignancy.  Keller Mikels DESCRIPTION:  A: Received in formalin are 2 portions of tan-red mucosa and subjacent tissue measuring 1.2 and 1.5 cm in greatest dimension.  The cut surfaces are edematous and hyperemic.  Sections are submitted in 1 cassette.  B: Received in formalin are 2 portions of tan-red skin, mucosa and subjacent tissue measuring 1.2 and 1.4 cm in greatest dimension.  The cut surfaces are edematous and hyperemic.  Sections are submitted in 1 cassette.  C: Received in formalin is a 3.2 x 1.5 cm portion of skin and subcutaneous tissue excised to a depth of up to 4.2 cm.  The skin surface shows a 0.7 cm linear defect marked with blue dye.  There is a fistula tract extending from the defect to the deep surface.  There is hemorrhage and fibrosis surrounding the fistula tract.  A section is submitted.  D: Received in formalin is a 1.0 x 0.5 x 0.4 cm polypoid portion of tan-pink mucosa.  The specimen is bisected and entirely submitted in 1 cassette.  [facility] 11/22/2019)  Final Diagnosis performed by Claudette Laws, MD.   Electronically signed 11/23/2019 Technical and / or Professional components performed at [facility], [place] [address] [place],  [place] [address]. Immunohistochemistry Technical component (if applicable) was performed at [facility]. [address], [place], [place], [place] [address].   IMMUNOHISTOCHEMISTRY DISCLAIMER (if applicable): Some of these immunohistochemical stains may have been developed and the performance characteristics determine by [facility]. Some may not have been cleared or approved by the U.S. Food and Drug Administration. The FDA has determined that such clearance or approval is not necessary. This test is used for clinical purposes. It should not be regarded as investigational or for research. This laboratory is certified under the [place] (CLIA-88) as qualified to perform high complexity clinical laboratory testing.  The controls stained appropriately.  11/22/2019  1:36 PM  PATIENT:  [name]  80 y.o. female  Patient Care Team: Marin Olp, MD as PCP - General (Family Medicine) Michael Boston, MD as Consulting Physician (General Surgery) Mauri Pole, MD as Consulting Physician (Gastroenterology) Jerline Pain, MD as Consulting Physician (Cardiology) Gaynelle Arabian, MD as Consulting Physician (Orthopedic Surgery)  PRE-OPERATIVE DIAGNOSIS:   PERIRECTAL FISTULA EXTERNAL HEMORRHOIDS WITH IRRITATION  POST-OPERATIVE DIAGNOSIS: INTERSPHINCTERIC PERIRECTAL FISTULA EXTERNAL HEMORRHOIDS WITH IRRITATION PROLAPSING ANAL POLYP  PROCEDURE: LIFT REPAIR OF PERIRECTAL FISTULA HEMORRHOIDECTOMY EXCISION OF ANAL POLYP HEMORRHOID LIGATION & PEXY ANORECTAL  EXAMINATION UNDER ANESTHESIA  SURGEON:  Olivia Hector, MD  ASSISTANT: OR Staff  ANESTHESIA:  General Anorectal & Local field block (0.25% bupivacaine with epinephrine mixed with Liposomal bupivacaine (Experel)  EBL:  Total I/O In: 1111 [I.V.:1000; IV Piggyback:111] Out: - .  See anesthesia record  Delay start of Pharmacological VTE agent  (>24hrs) due to surgical blood loss or risk of bleeding:  no  DRAINS: none  SPECIMENS:   External component of intersphincteric fistulous tract. External hemorrhoids. Prolapsing anal canal polyp  DISPOSITION OF SPECIMEN:  PATHOLOGY  COUNTS:  YES  PLAN OF CARE: Discharge to home after PACU  PATIENT DISPOSITION:  PACU - hemodynamically stable.  INDICATION: Patient with probable perirectal fistula.  I recommended examination and surgical treatment:  The anatomy & physiology of the anorectal region was discussed.  We discussed the pathophysiology of anorectal abscess and fistula.  Differential diagnosis was discussed.  Natural history progression was discussed.   I stressed the importance of a bowel regimen to have daily soft bowel movements to minimize progression of disease.     The patient's condition is not adequately controlled.  Non-operative treatment has not healed the fistula.  Therefore, I recommended examination under anaesthesia to confirm the diagnosis and treat the fistula.  I discussed techniques that may be required such as fistulotomy, ligation by LIFT technique, and/or seton placement.  Benefits & alternatives discussed.  I noted a good likelihood this will help address the problem, but sometimes repeat operations and prolonged healing times may occur.  Risks such as bleeding, pain, recurrence, reoperation, incontinence, heart attack, death, and other risks were discussed.      Educational handouts further explaining the pathology, treatment options, and bowel regimen were given.  The patient expressed understanding & wishes to proceed.  We will work to coordinate surgery for a mutually convenient time.  OR FINDINGS: Patient had an intersphincteric fistula.    External location LEFT ANTERIOR   about 4 cm from anal verge.  Internal location : Anterior midline at anal crypt about 1 cm from anal verge.  Right anterior external hemorrhoidal tissue.  Prolapsing right lateral  anal canal polyp. Grade 2 internal hemorrhoids x3 with some irritation especially right anterior & right posterior greater than left lateral  DESCRIPTION:  Informed consent was confirmed. Patient underwent general anesthesia without difficulty. Patient was placed into prone positioning.  Care was made for safe positioning given her prior shoulder and knee surgeries the perianal region was prepped and draped in sterile fashion. Surgical timeout confirmed or plan.  I did digital rectal examination and then transitioned over to anoscopy to get a sense of the anatomy.  I did place a probe through the external opening.    I also injected the track with methylene blue.  With this I was able to locate an internal opening.  The tract did not feel superficial, concerning for a probable intersphincteric fistula.  No abscess located.  I went ahead and proceeded with the LIFT technique.  I began to excise the external opening with a radial biconcave incision around it.  I transitioned to cautery and help free the fistulous tract circumferentially all way down towards the sphincter component.    I made an incision at the anal squamocolumnar junction .  Did careful dissection to get down to the sphincter complex.  I carefully went between the internal and external sphincter using careful blunt dissection parallel to the fibers.  I was able to locate the intersphincteric component of the  fistulous tract.  I was able to get around it gently with a right angle clamp.  I carefully skeletonized the intersphincteric component. I placed 2-0 Vicryl stitches through the intersphincteric tract on the proximal side and on the distal side in between the external & internal sphincters.  I transected the intersphincteric segment of the fistulous tract.   I ligated the stumps of the transected segments with 2-0 Vicryl again with a figure-of-eight stitch in a 90 degree fashion to doubly ligate and prove that the tract had been  closed.   I then transitioned to the rectal component.  Did a figure-of-eight stitch of 2-0 Vicryl suture several centimeters proximal to the internal opening in the rectum along the left anterior hemorrhoidal column.  I ran that longitudinally until it came to the opening.  I transected the rectal tissue anorectal component and a longitudinal fusiform fashion until I had healthier tissue.  I then ran the 2-0 Vicryl stitch down to the anal verge to also help cover up the intersphincteric ligation wound as well.  I tied that running suture down, thus closing the internal opening and protecting the LIFT repair.    I removed the superficial external end of the fistulous tract & ligated the base of the external wound just outside the sphincter component with 2-0 vicryl.  Excised some skin and dermis to have a broad flat wound.  I also did internal hemorrhoidal ligation on the other 5 hemorrhoidal columns using 2-0 Vicryl in a running suture 6 cm proximal anal verge in the running down longitudinal and tying down for pexy.  I had excised a right lateral prolapsing anal canal polyp as well.  Trimmed off some persistent right lateral and right anterior external hemorrhoidal tissue.  Close the right lateral wound with chromic sutures horizontal mattress interrupted.  Right anterior was more transverse I closed that with some interrupted horizontal mattress sutures transversely at the anal verge for good result. Hemostasis was excellent.  I reexamined the anal canal.   There is was no narrowing.  Hemostasis was excellent.  I repeated anoscopy and examination.  Hemostasis was good.  We placed fluff gauze to onlay over the wounds.  No packing done.  Patient is being extubated go to recovery room.  I discussed operative findings, updated the patient's status, discussed probable steps to recovery, and gave postoperative recommendations to the patient's spouse.  Recommendations were made.  Questions were answered.  He  expressed understanding & appreciation.  [name], M.D., F.A.C.S. Gastrointestinal and Minimally Invasive Surgery [facility] [address], [place], [place] [address] [phone] Main / Paging ` ` `  Problem List/Past Medical Olivia Hector, MD; 06/02/2020 3:09 PM) ACUTE DIVERTICULITIS (K57.92)   PERIANAL ABSCESS (K61.0)   INTERSPHINCTERIC FISTULA (K60.3)   ANAL POLYP (K62.0)   EXTERNAL HEMORRHOID (K64.4)   HISTORY OF RECTAL SURGERY (Z98.890)   IRRITABLE BOWEL SYNDROME WITH DIARRHEA (K58.0)   ENCOUNTER FOR PREOPERATIVE EXAMINATION FOR GENERAL SURGICAL PROCEDURE (J62.831)    Past Surgical History Olivia Hector, MD; 06/02/2020 3:09 PM) Breast Biopsy   Bilateral. Cataract Surgery   Right. Gallbladder Surgery - Laparoscopic   Hysterectomy (not due to cancer) - Partial   Knee Surgery   Right. Mastectomy   Bilateral. Sentinel Lymph Node Biopsy   Shoulder Surgery   Left. Thyroid Surgery   Tonsillectomy    Diagnostic Studies History Olivia Hector, MD; 06/02/2020 3:09 PM) Pap Smear   1-5 years ago  Allergies Malachi Bonds, CMA; 06/02/2020 2:57 PM) Cephalexin *CEPHALOSPORINS*   Erythromycin *MACROLIDES*   LevoFLOXacin *CHEMICALS*   Nitrofurantoin *URINARY ANTI-INFECTIVES*   OxyCODONE HCl (Abuse Deter) *ANALGESICS - OPIOID*    Medication History Malachi Bonds, CMA; 06/02/2020 2:57 PM) Amoxicillin-Pot Clavulanate  (875-125MG  Tablet, Oral) Active. Symbicort  (160-4.5MCG/ACT Aerosol, Inhalation) Active. Atenolol  (25MG  Tablet, Oral) Active. Durezol  (0.05% Emulsion, Ophthalmic) Active. Glimepiride  (4MG  Tablet, Oral) Active. FLUoxetine HCl  (40MG  Capsule, Oral) Active. Meloxicam  (15MG  Tablet, Oral) Active. ProAir HFA  (108 (90 Base)MCG/ACT Aerosol Soln, Inhalation) Active. Victoza  (18MG /3ML Soln Pen-inj, Subcutaneous) Active. Lisinopril  (10MG  Tablet, Oral) Active. Pramipexole Dihydrochloride  (0.5MG  Tablet, Oral)  Active. Terconazole  (0.4% Cream, Vaginal) Active. Medications Reconciled   Social History Olivia Hector, MD; 06/02/2020 3:09 PM) Caffeine use   Carbonated beverages. No alcohol use   No drug use   Tobacco use   Never smoker.  Family History Olivia Hector, MD; 06/02/2020 3:09 PM) Arthritis   Mother. Heart Disease   Father. Respiratory Condition   Father.  Pregnancy / Birth History Olivia Hector, MD; 06/02/2020 3:09 PM) Age of men[age]   [age] Regular periods    Other Problems Olivia Hector, MD; 06/02/2020 3:09 PM) Arthritis   Breast Cancer   Diabetes Mellitus   Diverticulosis   Hemorrhoids   High blood pressure   Oophorectomy   Bilateral. Thyroid Disease   RECURRENT UTI (URINARY TRACT INFECTION) (N39.0)    Physical Exam Olivia Hector MD; 06/02/2020 3:17 PM) General Mental Status - Alert. General Appearance - Not in acute distress. Voice - Normal. Note:  Comes in a wheelchair.  Moves very slowly but steadily.  Integumentary Global Assessment Upon inspection and palpation of skin surfaces of the - Distribution of scalp and body hair is normal. General Characteristics Overall examination of the patient's skin reveals - no rashes and no suspicious lesions.  Head and Neck Head - normocephalic, atraumatic with no lesions or palpable masses. Face Global Assessment - atraumatic, no absence of expression. Neck Global Assessment - no abnormal movements, no decreased range of motion. Trachea - midline. Thyroid Gland Characteristics - non-tender.  Eye Eyeball - Left - Extraocular movements intact, No Nystagmus - Left. Eyeball - Right - Extraocular movements intact, No Nystagmus - Right. Upper Eyelid - Left - No Cyanotic - Left. Upper Eyelid - Right - No Cyanotic - Right.  Chest and Lung Exam Inspection Accessory muscles - No use of accessory muscles in breathing.  Rectal Note:  Left anterior perirectal wound mostly closed down. 1 x 1 x 1 cm deep. A  little more difficulty. Excellent granulation tissue. No purulence or drainage.  Otherwise normal sphincter tone.  A few small perianal hemorrhoid tags. No other perianal wounds.    Peripheral Vascular Upper Extremity Inspection - Left - Not Gangrenous, No Petechiae. Inspection - Right - Not Gangrenous, No Petechiae.  Neurologic Neurologic evaluation reveals  - normal attention span and ability to concentrate, able to name objects and repeat phrases. Appropriate fund of knowledge and normal coordination.  Neuropsychiatric Mental status exam performed with findings of - able to articulate well with normal speech/language, rate, volume and coherence and no evidence of hallucinations, delusions, obsessions or homicidal/suicidal ideation. Orientation - oriented X3.  Musculoskeletal Global Assessment Gait and Station - normal gait and station.  Lymphatic General Lymphatics Description - No Generalized lymphadenopathy.  Assessment & Plan Olivia Hector MD; 06/02/2020 3:18 PM) HISTORY OF RECTAL SURGERY [phone]) Current Plans Follow  up with [name] in the office in 6 WEEKS.   Call us sooner as needed.  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  Wound appears to be closing down.  Consider switching to a cotton ball as a way to help absorb moisture but be less irritating than gauze.  Follow until the wound closes down.  Continue working on bowel regimen to minimize diarrhea or accidents. Current Plans Pt Education - CCS Abscess/Fistula (AT): discussed with patient and provided information.  Signed electronically by Olivia Hector, MD (06/02/2020 3:19 PM)  SURGERY NOTES:  Located in [place]' section of Epic EMR chart  PATHOLOGY:  Located in [place]'  section of Epic EMR chart  Pathology: SURGICAL PATHOLOGY CASE: [id number] PATIENT: [name] Surgical Pathology Report  Clinical History: Perirectal fistula (crm)  FINAL MICROSCOPIC DIAGNOSIS:  A. HEMORRHOID, LEFT ANTERIOR, HEMORRHOIDECTOMY: - Findings consistent with hemorrhoid. - No dysplasia or malignancy.  B. HEMORRHOID, RIGHT ANTERIOR, HEMORRHOIDECTOMY: - Findings consistent with hemorrhoid with focal inflammation. - No dysplasia or malignancy.  C. FISTULA TRACK: - Fistula tract associated with acute inflammation and granulation tissue. - No dysplasia or malignancy.  D. HEMORRHOID, RIGHT LATERAL, HEMORRHOIDECTOMY: - Findings consistent with hemorrhoid. - No dysplasia or malignancy.  T[place]um Menton DESCRIPTION:  A: Received in formalin are 2 portions of tan-red mucosa and subjacent tissue measuring 1.2 and 1.5 cm in greatest dimension.  The cut surfaces are edematous and hyperemic.  Sections are submitted in 1 cassette.  B: Received in formalin are 2 portions of tan-red skin, mucosa and subjacent tissue measuring 1.2 and 1.4 cm in greatest dimension.  The cut surfaces are edematous and hyperemic.  Sections are submitted in 1 cassette.  C: Received in formalin is a 3.2 x 1.5 cm portion of skin and subcutaneous tissue excised to a depth of up to 4.2 cm.  The skin surface shows a 0.7 cm linear defect marked with blue dye.  There is a fistula tract extending from the defect to the deep surface.  There is hemorrhage and fibrosis surrounding the fistula tract.  A section is submitted.  D: Received in formalin is a 1.0 x 0.5 x 0.4 cm polypoid portion of tan-pink mucosa.  The specimen is bisected and entirely submitted in 1 cassette.  [facility] 11/22/2019)  Final Diagnosis performed by Claudette Laws, MD.   Electronically signed 11/23/2019 Technical and / or Professional components performed at [facility], [place] [address], [place], [place]  [address]. Immunohistochemistry Technical component (if applicable) was performed at [facility]. [address], [place], [place], [place] [address].   IMMUNOHISTOCHEMISTRY DISCLAIMER (if applicable): Some of these immunohistochemical stains may have been developed and the performance characteristics determine by [facility]. Some may not have been cleared or approved by the U.S. Food and Drug Administration. The FDA has determined that such clearance or approval is not necessary. This test is used for clinical purposes. It should not be regarded as investigational or for research. This laboratory is certified under the [place] (CLIA-88) as qualified to perform high complexity clinical laboratory testing.  The controls stained appropriately.  Physical Examination:   Constitutional: Not cachectic. Hygeine adequate. Vitals signs as above. Comes in wheelchair. Able to get to table with one-person assist Eyes: Normal extraocular movements. Sclera nonicteric Neuro:  No major focal sensory defects. No major motor deficits. Psych: No severe agitation. No severe anxiety. Judgment & insight Adequate, Oriented x4, HENT: Normocephalic, Mucus membranes moist. No thrush.  Neck: Supple, No tracheal deviation. No obvious thyromegaly Chest: No pain to chest wall compression. Good respiratory excursion. No audible wheezing CV: Regular rhythm. No major extremity edema  Abdomen: Obese with panniculus Hernia: Not present. Diastasis recti: Not present. Soft. Nondistended. Nontender.   Gen: Inguinal hernia: Not present. Inguinal lymph nodes: without lymphadenopathy. No major vaginal bleeding or discharge. I do not feel any opening in the rectovaginal septum. She does have a ring higher up in her vaginal vault  Rectal: Perianal skin with moisture. Left anterior perianal 5 mm punched out opening just below her left external labia within a  centimeter of the anal verge suspicious for recurrent anal fistula. Sensitive but tolerates digital exam. Hard to get good anoscopic exam with her morbid obesity and sensitivity. She does have an anal crypt polyp that somewhat prolapses out. Suspicious for recurrent fistula but hard to tell on exam. No major proctitis. No posterior midline fissure. No new abscess.  Ext: No obvious deformity or contracture. Edema: Not present. No cyanosis Skin: Warm and dry Musculoskeletal: Severe joint rigidity not present.   Assessment and Plan:   [name] is a 80 y.o. 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  Does not seem actively infected at this time but I worry about a recurrent fistula. I recommended outpatient anorectal examination under anesthesia. If you choose a fistula, would place a seton to help with better drain in and heal as much as possible. If superficial, I am trying debride.  If she does have recurrent fistula most likely would need some type of advancement flap which I worry about will fail in the setting of her diarrhea and other issues. Would like to try and get her bowels under better control before considering yet another operation. Another option would just be leave the seton in place. We will see.  I strongly recommend she switch her bowel regimen to something different from [place]. Try flaxseed  and some people swear by having less flatus issues with that. Consider low-dose Imodium to control her diarrhea. A challenge in the setting of being on different antibiotics for recurrent urinary tract infections. If diarrhea not controlled with these measures, strongly reconsider seeing gastroenterology for further input/insights. Colonoscopy 2 years ago mostly underwhelming so doubt that she has any true collagenous or autoimmune colitis.  No follow-ups on file.  The plan was discussed in detail with the patient today, who expressed understanding & appreciation. The patient has my contact information, and understands to call me with any additional questions or concerns in the interval. I would be happy to see the patient back sooner if the need arises.  Olivia Hector, MD, FACS, MASCRS Esophageal, Gastrointestinal & Colorectal Surgery Robotic and Minimally Invasive Surgery  [facility], [place]  [place]. [address]  [place], [place] [address] [phone] Fax [phone] Main

## 2021-03-19 NOTE — Transfer of Care (Signed)
Immediate Anesthesia Transfer of Care Note  Patient: [name]  Procedure(s) Performed: Procedure(s): RECTAL EXAM UNDER ANESTHESIA (N/A) LIMBERG RHOMBOID ROTATIONAL FLAP CLOSURE (N/A)  Patient Location: PACU  Anesthesia Type:General  Level of Consciousness:  sedated, patient cooperative and responds to stimulation  Airway & Oxygen Therapy:Patient Spontanous Breathing and Patient connected to face mask oxgen  Post-op Assessment:  Report given to PACU RN and Post -op Vital signs reviewed and stable  Post vital signs:  Reviewed and stable  Last Vitals:  Vitals:   03/19/21 0555  BP: (!) 146/55  Pulse: 75  Resp: 18  Temp: 36.8 C  SpO2: 92%    Complications: No apparent anesthesia complications

## 2021-03-19 NOTE — Anesthesia Postprocedure Evaluation (Signed)
Anesthesia Post Note  Patient: [name]  Procedure(s) Performed: RECTAL EXAM UNDER ANESTHESIA LIMBERG RHOMBOID ROTATIONAL FLAP CLOSURE     Patient location during evaluation: PACU Anesthesia Type: General Level of consciousness: awake and alert Pain management: pain level controlled Vital Signs Assessment: post-procedure vital signs reviewed and stable Respiratory status: spontaneous breathing, nonlabored ventilation and respiratory function stable Cardiovascular status: blood pressure returned to baseline and stable Postop Assessment: no apparent nausea or vomiting Anesthetic complications: no   No notable events documented.  Last Vitals:  Vitals:   03/19/21 1048 03/19/21 1058  BP:  114/67  Pulse: 73 74  Resp: 14 16  Temp:  36.6 C  SpO2: 94% 95%    Last Pain:  Vitals:   03/19/21 1058  TempSrc:   PainSc: 0-No pain                 Jayzen Paver,W. EDMOND

## 2021-03-19 NOTE — Anesthesia Procedure Notes (Signed)
Procedure Name: Intubation Date/Time: 03/19/2021 7:45 AM Performed by: Lavina Hamman, CRNA Pre-anesthesia Checklist: Patient identified, Emergency Drugs available, Suction available, Patient being monitored and Timeout performed Patient Re-evaluated:Patient Re-evaluated prior to induction Oxygen Delivery Method: Circle system utilized Preoxygenation: Pre-oxygenation with 100% oxygen Induction Type: IV induction Ventilation: Mask ventilation without difficulty and Oral airway inserted - appropriate to patient size Laryngoscope Size: Mac and 4 Grade View: Grade I Tube type: Oral Tube size: 7.5 mm Number of attempts: 1 Airway Equipment and Method: Stylet Placement Confirmation: ETT inserted through vocal cords under direct vision, positive ETCO2, CO2 detector and breath sounds checked- equal and bilateral Secured at: 23 cm Tube secured with: Tape Dental Injury: Teeth and Oropharynx as per pre-operative assessment  Comments: ATOI

## 2021-03-20 ENCOUNTER — Encounter (HOSPITAL_COMMUNITY): Payer: Self-pay | Admitting: Surgery

## 2021-03-20 ENCOUNTER — Other Ambulatory Visit: Payer: Self-pay | Admitting: Family Medicine

## 2021-03-20 LAB — SURGICAL PATHOLOGY

## 2021-03-21 ENCOUNTER — Other Ambulatory Visit: Payer: Self-pay

## 2021-03-21 ENCOUNTER — Observation Stay (HOSPITAL_COMMUNITY): Payer: Medicare PPO

## 2021-03-21 ENCOUNTER — Emergency Department (HOSPITAL_BASED_OUTPATIENT_CLINIC_OR_DEPARTMENT_OTHER): Payer: Medicare PPO

## 2021-03-21 ENCOUNTER — Inpatient Hospital Stay (HOSPITAL_BASED_OUTPATIENT_CLINIC_OR_DEPARTMENT_OTHER)
Admission: EM | Admit: 2021-03-21 | Discharge: 2021-03-26 | DRG: 166 | Disposition: A | Discharge status: Skilled Nursing Facility | Payer: Medicare PPO | Attending: Internal Medicine | Admitting: Internal Medicine

## 2021-03-21 ENCOUNTER — Encounter (HOSPITAL_BASED_OUTPATIENT_CLINIC_OR_DEPARTMENT_OTHER): Payer: Self-pay | Admitting: Emergency Medicine

## 2021-03-21 ENCOUNTER — Emergency Department (HOSPITAL_BASED_OUTPATIENT_CLINIC_OR_DEPARTMENT_OTHER): Payer: Medicare PPO | Admitting: Radiology

## 2021-03-21 DIAGNOSIS — Z6836 Body mass index (BMI) 36.0-36.9, adult: Secondary | ICD-10-CM | POA: Diagnosis not present

## 2021-03-21 DIAGNOSIS — Z96653 Presence of artificial knee joint, bilateral: Secondary | ICD-10-CM | POA: Diagnosis not present

## 2021-03-21 DIAGNOSIS — M8580 Other specified disorders of bone density and structure, unspecified site: Secondary | ICD-10-CM | POA: Diagnosis not present

## 2021-03-21 DIAGNOSIS — Z83438 Family history of other disorder of lipoprotein metabolism and other lipidemia: Secondary | ICD-10-CM

## 2021-03-21 DIAGNOSIS — J45909 Unspecified asthma, uncomplicated: Secondary | ICD-10-CM | POA: Diagnosis not present

## 2021-03-21 DIAGNOSIS — N179 Acute kidney failure, unspecified: Secondary | ICD-10-CM | POA: Diagnosis not present

## 2021-03-21 DIAGNOSIS — Z87440 Personal history of urinary (tract) infections: Secondary | ICD-10-CM

## 2021-03-21 DIAGNOSIS — E1142 Type 2 diabetes mellitus with diabetic polyneuropathy: Secondary | ICD-10-CM | POA: Diagnosis not present

## 2021-03-21 DIAGNOSIS — N360 Urethral fistula: Secondary | ICD-10-CM

## 2021-03-21 DIAGNOSIS — Z96612 Presence of left artificial shoulder joint: Secondary | ICD-10-CM | POA: Diagnosis not present

## 2021-03-21 DIAGNOSIS — Z825 Family history of asthma and other chronic lower respiratory diseases: Secondary | ICD-10-CM

## 2021-03-21 DIAGNOSIS — Z9049 Acquired absence of other specified parts of digestive tract: Secondary | ICD-10-CM

## 2021-03-21 DIAGNOSIS — F3342 Major depressive disorder, recurrent, in full remission: Secondary | ICD-10-CM | POA: Diagnosis present

## 2021-03-21 DIAGNOSIS — J189 Pneumonia, unspecified organism: Secondary | ICD-10-CM | POA: Diagnosis not present

## 2021-03-21 DIAGNOSIS — E119 Type 2 diabetes mellitus without complications: Secondary | ICD-10-CM | POA: Diagnosis present

## 2021-03-21 DIAGNOSIS — M25461 Effusion, right knee: Secondary | ICD-10-CM | POA: Diagnosis not present

## 2021-03-21 DIAGNOSIS — Z20822 Contact with and (suspected) exposure to covid-19: Secondary | ICD-10-CM | POA: Diagnosis not present

## 2021-03-21 DIAGNOSIS — W07XXXA Fall from chair, initial encounter: Secondary | ICD-10-CM | POA: Diagnosis not present

## 2021-03-21 DIAGNOSIS — J9811 Atelectasis: Secondary | ICD-10-CM | POA: Diagnosis not present

## 2021-03-21 DIAGNOSIS — K589 Irritable bowel syndrome without diarrhea: Secondary | ICD-10-CM | POA: Diagnosis present

## 2021-03-21 DIAGNOSIS — R0902 Hypoxemia: Secondary | ICD-10-CM

## 2021-03-21 DIAGNOSIS — M6250 Muscle wasting and atrophy, not elsewhere classified, unspecified site: Secondary | ICD-10-CM | POA: Diagnosis not present

## 2021-03-21 DIAGNOSIS — K644 Residual hemorrhoidal skin tags: Secondary | ICD-10-CM | POA: Diagnosis not present

## 2021-03-21 DIAGNOSIS — E669 Obesity, unspecified: Secondary | ICD-10-CM | POA: Diagnosis not present

## 2021-03-21 DIAGNOSIS — Z853 Personal history of malignant neoplasm of breast: Secondary | ICD-10-CM

## 2021-03-21 DIAGNOSIS — K580 Irritable bowel syndrome with diarrhea: Secondary | ICD-10-CM | POA: Diagnosis present

## 2021-03-21 DIAGNOSIS — Z888 Allergy status to other drugs, medicaments and biological substances status: Secondary | ICD-10-CM

## 2021-03-21 DIAGNOSIS — J9601 Acute respiratory failure with hypoxia: Secondary | ICD-10-CM | POA: Diagnosis not present

## 2021-03-21 DIAGNOSIS — H409 Unspecified glaucoma: Secondary | ICD-10-CM | POA: Diagnosis present

## 2021-03-21 DIAGNOSIS — M25561 Pain in right knee: Secondary | ICD-10-CM | POA: Diagnosis not present

## 2021-03-21 DIAGNOSIS — Z7984 Long term (current) use of oral hypoglycemic drugs: Secondary | ICD-10-CM

## 2021-03-21 DIAGNOSIS — G2581 Restless legs syndrome: Secondary | ICD-10-CM | POA: Diagnosis present

## 2021-03-21 DIAGNOSIS — E785 Hyperlipidemia, unspecified: Secondary | ICD-10-CM | POA: Diagnosis not present

## 2021-03-21 DIAGNOSIS — Z8719 Personal history of other diseases of the digestive system: Secondary | ICD-10-CM

## 2021-03-21 DIAGNOSIS — Z8249 Family history of ischemic heart disease and other diseases of the circulatory system: Secondary | ICD-10-CM

## 2021-03-21 DIAGNOSIS — K219 Gastro-esophageal reflux disease without esophagitis: Secondary | ICD-10-CM | POA: Diagnosis present

## 2021-03-21 DIAGNOSIS — R159 Full incontinence of feces: Secondary | ICD-10-CM | POA: Diagnosis not present

## 2021-03-21 DIAGNOSIS — E890 Postprocedural hypothyroidism: Secondary | ICD-10-CM | POA: Diagnosis present

## 2021-03-21 DIAGNOSIS — Z87442 Personal history of urinary calculi: Secondary | ICD-10-CM

## 2021-03-21 DIAGNOSIS — R0602 Shortness of breath: Secondary | ICD-10-CM | POA: Diagnosis not present

## 2021-03-21 DIAGNOSIS — K605 Anorectal fistula: Secondary | ICD-10-CM | POA: Diagnosis not present

## 2021-03-21 DIAGNOSIS — F419 Anxiety disorder, unspecified: Secondary | ICD-10-CM | POA: Diagnosis not present

## 2021-03-21 DIAGNOSIS — Z9013 Acquired absence of bilateral breasts and nipples: Secondary | ICD-10-CM

## 2021-03-21 DIAGNOSIS — I10 Essential (primary) hypertension: Secondary | ICD-10-CM | POA: Diagnosis not present

## 2021-03-21 DIAGNOSIS — Z881 Allergy status to other antibiotic agents status: Secondary | ICD-10-CM

## 2021-03-21 DIAGNOSIS — Z79899 Other long term (current) drug therapy: Secondary | ICD-10-CM

## 2021-03-21 LAB — CBC
HCT: 36.9 % (ref 36.0–46.0)
HCT: 34.4 % — ABNORMAL LOW (ref 36.0–46.0)
Hemoglobin: 11.4 g/dL — ABNORMAL LOW (ref 12.0–15.0)
Hemoglobin: 11 g/dL — ABNORMAL LOW (ref 12.0–15.0)
MCH: 29.1 pg (ref 26.0–34.0)
MCH: 29.9 pg (ref 26.0–34.0)
MCHC: 30.9 g/dL (ref 30.0–36.0)
MCHC: 32 g/dL (ref 30.0–36.0)
MCV: 94.1 fL (ref 80.0–100.0)
MCV: 93.5 fL (ref 80.0–100.0)
Platelets: 240 10*3/uL (ref 150–400)
Platelets: 208 10*3/uL (ref 150–400)
RBC: 3.92 MIL/uL (ref 3.87–5.11)
RBC: 3.68 MIL/uL — ABNORMAL LOW (ref 3.87–5.11)
RDW: 14.5 % (ref 11.5–15.5)
RDW: 14.4 % (ref 11.5–15.5)
WBC: 11.1 10*3/uL — ABNORMAL HIGH (ref 4.0–10.5)
WBC: 9 10*3/uL (ref 4.0–10.5)
nRBC: 0 % (ref 0.0–0.2)
nRBC: 0 % (ref 0.0–0.2)

## 2021-03-21 LAB — I-STAT ARTERIAL BLOOD GAS, ED
Acid-Base Excess: 5 mmol/L — ABNORMAL HIGH (ref 0.0–2.0)
Bicarbonate: 31.7 mmol/L — ABNORMAL HIGH (ref 20.0–28.0)
Calcium, Ion: 1.14 mmol/L — ABNORMAL LOW (ref 1.15–1.40)
HCT: 35 % — ABNORMAL LOW (ref 36.0–46.0)
Hemoglobin: 11.9 g/dL — ABNORMAL LOW (ref 12.0–15.0)
O2 Saturation: 87 %
Patient temperature: 98.6
Potassium: 3.7 mmol/L (ref 3.5–5.1)
Sodium: 135 mmol/L (ref 135–145)
TCO2: 33 mmol/L — ABNORMAL HIGH (ref 22–32)
pCO2 arterial: 56 mmHg — ABNORMAL HIGH (ref 32.0–48.0)
pH, Arterial: 7.361 (ref 7.350–7.450)
pO2, Arterial: 56 mmHg — ABNORMAL LOW (ref 83.0–108.0)

## 2021-03-21 LAB — BASIC METABOLIC PANEL
Anion gap: 9 (ref 5–15)
BUN: 24 mg/dL — ABNORMAL HIGH (ref 8–23)
CO2: 32 mmol/L (ref 22–32)
Calcium: 8.7 mg/dL — ABNORMAL LOW (ref 8.9–10.3)
Chloride: 95 mmol/L — ABNORMAL LOW (ref 98–111)
Creatinine, Ser: 1.37 mg/dL — ABNORMAL HIGH (ref 0.44–1.00)
GFR, Estimated: 39 mL/min — ABNORMAL LOW (ref >60)
Glucose, Bld: 181 mg/dL — ABNORMAL HIGH (ref 70–99)
Potassium: 3.8 mmol/L (ref 3.5–5.1)
Sodium: 136 mmol/L (ref 135–145)

## 2021-03-21 LAB — CBG MONITORING, ED: Glucose-Capillary: 198 mg/dL — ABNORMAL HIGH (ref 70–99)

## 2021-03-21 LAB — GLUCOSE, CAPILLARY
Glucose-Capillary: 276 mg/dL — ABNORMAL HIGH (ref 70–99)
Glucose-Capillary: 256 mg/dL — ABNORMAL HIGH (ref 70–99)

## 2021-03-21 LAB — LACTIC ACID, PLASMA
Lactic Acid, Venous: 1.6 mmol/L (ref 0.5–1.9)
Lactic Acid, Venous: 1.3 mmol/L (ref 0.5–1.9)

## 2021-03-21 LAB — RESP PANEL BY RT-PCR (FLU A&B, COVID) ARPGX2
Influenza A by PCR: NEGATIVE
Influenza B by PCR: NEGATIVE
SARS Coronavirus 2 by RT PCR: NEGATIVE

## 2021-03-21 MED ORDER — ATENOLOL 25 MG PO TABS
25.0000 mg | ORAL_TABLET | Freq: Every day | ORAL | Status: DC
  Administered 2021-03-21 – 2021-03-26 (×6): 25 mg via ORAL
  Filled 2021-03-21 (×7): qty 1

## 2021-03-21 MED ORDER — SODIUM CHLORIDE 0.9 % IV SOLN: INTRAVENOUS | Status: DC

## 2021-03-21 MED ORDER — MOMETASONE FURO-FORMOTEROL FUM 100-5 MCG/ACT IN AERO
2.0000 | INHALATION_SPRAY | Freq: Two times a day (BID) | RESPIRATORY_TRACT | Status: DC
  Administered 2021-03-21 – 2021-03-26 (×10): 2 via RESPIRATORY_TRACT
  Filled 2021-03-21: qty 8.8

## 2021-03-21 MED ORDER — ACETAMINOPHEN 650 MG RE SUPP: 650.0000 mg | Freq: Four times a day (QID) | RECTAL | Status: DC | PRN

## 2021-03-21 MED ORDER — PRAVASTATIN SODIUM 20 MG PO TABS
10.0000 mg | ORAL_TABLET | Freq: Every day | ORAL | Status: DC
  Administered 2021-03-22 – 2021-03-25 (×4): 10 mg via ORAL
  Filled 2021-03-21 (×5): qty 1

## 2021-03-21 MED ORDER — INSULIN ASPART 100 UNIT/ML IJ SOLN
0.0000 [IU] | Freq: Every day | INTRAMUSCULAR | Status: DC
Start: 2021-03-21 — End: 2021-03-26
  Administered 2021-03-21: 3 [IU] via SUBCUTANEOUS
  Administered 2021-03-23: 5 [IU] via SUBCUTANEOUS
  Administered 2021-03-24 – 2021-03-25 (×2): 2 [IU] via SUBCUTANEOUS

## 2021-03-21 MED ORDER — GUAIFENESIN ER 600 MG PO TB12
600.0000 mg | ORAL_TABLET | Freq: Two times a day (BID) | ORAL | Status: DC
  Administered 2021-03-21 – 2021-03-26 (×10): 600 mg via ORAL
  Filled 2021-03-21 (×10): qty 1

## 2021-03-21 MED ORDER — DOXYCYCLINE HYCLATE 100 MG PO TABS
100.0000 mg | ORAL_TABLET | Freq: Two times a day (BID) | ORAL | Status: DC
  Administered 2021-03-21 – 2021-03-26 (×10): 100 mg via ORAL
  Filled 2021-03-21 (×11): qty 1

## 2021-03-21 MED ORDER — COQ10 200 MG PO CAPS: 200.0000 mg | ORAL_CAPSULE | Freq: Every day | ORAL | Status: DC

## 2021-03-21 MED ORDER — INSULIN ASPART 100 UNIT/ML IJ SOLN
0.0000 [IU] | Freq: Three times a day (TID) | INTRAMUSCULAR | Status: DC
  Administered 2021-03-22: 3 [IU] via SUBCUTANEOUS
  Administered 2021-03-22: 8 [IU] via SUBCUTANEOUS
  Administered 2021-03-22: 3 [IU] via SUBCUTANEOUS
  Administered 2021-03-23: 5 [IU] via SUBCUTANEOUS
  Administered 2021-03-23 (×2): 3 [IU] via SUBCUTANEOUS
  Administered 2021-03-24: 5 [IU] via SUBCUTANEOUS
  Administered 2021-03-24: 8 [IU] via SUBCUTANEOUS
  Administered 2021-03-24 – 2021-03-25 (×2): 5 [IU] via SUBCUTANEOUS
  Administered 2021-03-25: 3 [IU] via SUBCUTANEOUS
  Administered 2021-03-25: 8 [IU] via SUBCUTANEOUS
  Administered 2021-03-26 (×2): 5 [IU] via SUBCUTANEOUS

## 2021-03-21 MED ORDER — ALBUTEROL SULFATE HFA 108 (90 BASE) MCG/ACT IN AERS: 2.0000 | INHALATION_SPRAY | RESPIRATORY_TRACT | Status: DC | PRN

## 2021-03-21 MED ORDER — SODIUM CHLORIDE 0.9 % IV SOLN
100.0000 mg | Freq: Once | INTRAVENOUS | Status: AC
  Administered 2021-03-21: 100 mg via INTRAVENOUS
  Filled 2021-03-21: qty 100

## 2021-03-21 MED ORDER — ACETAMINOPHEN 325 MG PO TABS: 650.0000 mg | ORAL_TABLET | Freq: Four times a day (QID) | ORAL | Status: DC | PRN

## 2021-03-21 MED ORDER — GABAPENTIN 100 MG PO CAPS
200.0000 mg | ORAL_CAPSULE | Freq: Two times a day (BID) | ORAL | Status: DC
  Administered 2021-03-21 – 2021-03-26 (×10): 200 mg via ORAL
  Filled 2021-03-21 (×10): qty 2

## 2021-03-21 MED ORDER — PANTOPRAZOLE SODIUM 40 MG PO TBEC
40.0000 mg | DELAYED_RELEASE_TABLET | Freq: Every day | ORAL | Status: DC
  Administered 2021-03-22 – 2021-03-26 (×5): 40 mg via ORAL
  Filled 2021-03-21 (×7): qty 1

## 2021-03-21 MED ORDER — TEMAZEPAM 15 MG PO CAPS
15.0000 mg | ORAL_CAPSULE | Freq: Every day | ORAL | Status: DC
  Administered 2021-03-21 – 2021-03-25 (×5): 15 mg via ORAL
  Filled 2021-03-21 (×5): qty 1

## 2021-03-21 MED ORDER — SODIUM CHLORIDE 0.9 % IV SOLN
2.0000 g | Freq: Every day | INTRAVENOUS | Status: AC
  Administered 2021-03-21 – 2021-03-25 (×5): 2 g via INTRAVENOUS
  Filled 2021-03-21 (×5): qty 20

## 2021-03-21 MED ORDER — LATANOPROST 0.005 % OP SOLN
1.0000 [drp] | Freq: Every day | OPHTHALMIC | Status: DC
  Administered 2021-03-21 – 2021-03-25 (×5): 1 [drp] via OPHTHALMIC
  Filled 2021-03-21: qty 2.5

## 2021-03-21 MED ORDER — HYDROCODONE-ACETAMINOPHEN 5-325 MG PO TABS: 1.0000 | ORAL_TABLET | Freq: Four times a day (QID) | ORAL | Status: DC | PRN

## 2021-03-21 MED ORDER — NETARSUDIL-LATANOPROST 0.02-0.005 % OP SOLN
1.0000 [drp] | Freq: Every day | OPHTHALMIC | Status: DC
Start: 2021-03-22 — End: 2021-03-26
  Administered 2021-03-23 – 2021-03-25 (×3): 1 [drp] via OPHTHALMIC

## 2021-03-21 MED ORDER — COLESEVELAM HCL 625 MG PO TABS
625.0000 mg | ORAL_TABLET | Freq: Three times a day (TID) | ORAL | Status: DC
  Administered 2021-03-21 – 2021-03-26 (×14): 625 mg via ORAL
  Filled 2021-03-21 (×17): qty 1

## 2021-03-21 MED ORDER — VITAMIN B-12 1000 MCG PO TABS
1000.0000 ug | ORAL_TABLET | Freq: Every day | ORAL | Status: DC
  Administered 2021-03-22 – 2021-03-26 (×5): 1000 ug via ORAL
  Filled 2021-03-21 (×6): qty 1

## 2021-03-21 MED ORDER — CHLORHEXIDINE GLUCONATE CLOTH 2 % EX PADS
6.0000 | MEDICATED_PAD | Freq: Every day | CUTANEOUS | Status: DC
  Administered 2021-03-24 – 2021-03-26 (×2): 6 via TOPICAL

## 2021-03-21 MED ORDER — DORZOLAMIDE HCL-TIMOLOL MAL 2-0.5 % OP SOLN
1.0000 [drp] | Freq: Two times a day (BID) | OPHTHALMIC | Status: DC
  Administered 2021-03-21 – 2021-03-26 (×10): 1 [drp] via OPHTHALMIC
  Filled 2021-03-21: qty 10

## 2021-03-21 MED ORDER — LACTATED RINGERS IV BOLUS
1000.0000 mL | Freq: Once | INTRAVENOUS | Status: AC
  Administered 2021-03-21: 1000 mL via INTRAVENOUS

## 2021-03-21 MED ORDER — DICYCLOMINE HCL 20 MG PO TABS
20.0000 mg | ORAL_TABLET | Freq: Three times a day (TID) | ORAL | Status: DC
  Administered 2021-03-21 – 2021-03-26 (×18): 20 mg via ORAL
  Filled 2021-03-21 (×21): qty 1

## 2021-03-21 MED ORDER — VENLAFAXINE HCL ER 75 MG PO CP24
75.0000 mg | ORAL_CAPSULE | Freq: Every day | ORAL | Status: DC
  Administered 2021-03-22 – 2021-03-24 (×3): 75 mg via ORAL
  Filled 2021-03-21 (×3): qty 1

## 2021-03-21 MED ORDER — IOHEXOL 350 MG/ML SOLN
100.0000 mL | Freq: Once | INTRAVENOUS | Status: AC | PRN
  Administered 2021-03-21: 59 mL via INTRAVENOUS

## 2021-03-21 MED ORDER — LORATADINE 10 MG PO TABS
10.0000 mg | ORAL_TABLET | Freq: Every day | ORAL | Status: DC
  Administered 2021-03-21 – 2021-03-26 (×6): 10 mg via ORAL
  Filled 2021-03-21 (×7): qty 1

## 2021-03-21 MED ORDER — PRAMIPEXOLE DIHYDROCHLORIDE 0.25 MG PO TABS
0.5000 mg | ORAL_TABLET | Freq: Every day | ORAL | Status: DC
  Administered 2021-03-21 – 2021-03-25 (×5): 0.5 mg via ORAL
  Filled 2021-03-21 (×5): qty 2

## 2021-03-21 MED ORDER — ENOXAPARIN SODIUM 60 MG/0.6ML IJ SOSY
60.0000 mg | PREFILLED_SYRINGE | INTRAMUSCULAR | Status: DC
  Administered 2021-03-21 – 2021-03-25 (×5): 60 mg via SUBCUTANEOUS
  Filled 2021-03-21 (×5): qty 0.6

## 2021-03-21 NOTE — ED Notes (Signed)
Report called to [employer], [place]. Report called to [facility].

## 2021-03-21 NOTE — ED Notes (Signed)
RT Note: Pt. placed back on 2 lpm n/c after room air ABG obtained, DR. Ray made aware of Critical results.

## 2021-03-21 NOTE — ED Triage Notes (Signed)
Pt fell and hit her right knee yesterday. Today the right  knee is swollen, red ,painful. Cant bear weight. Sats 88%.

## 2021-03-21 NOTE — ED Notes (Signed)
RT Note: Pt. arrived by [facility], was on Oxygen en-route and does not use at home, initial saturation on room air was 78-83%, was placed on 2 lpm n/c initially then taken off for room air ABG, replaced upon awaiting results of ABG, RT to monitor.

## 2021-03-21 NOTE — H&P (Signed)
History and Physical    [name]:[id number] DOB: [birth year] DOA: 03/21/2021  PCP: Marin Olp, MD  Patient coming from: Home  Chief Complaint: right knee pain  HPI: [name] is a 80 y.o. female with medical history significant of HTN, GERD, MDD. Presenting with right knee pain. She has rectal fistula surgery 3 days ago. She was able to discharge to home on that day. She c/o urinary retention, so she went to the surgery clinic for a foley placement. When she got back home, she felt a little dizzy (she reports that she had taken her pain medicines). She sat down, but then fell out of her chair and hurt her knee. There was no head injury or LOC. Her husband called for EMS. They came out and evaluated her. She declined transfer to the ED at that time and was able to get to bed without incident. The morning she woke up and was finding it difficult to transfer to a bedside commode. Her husband became concerned and called for EMS to bring her to the ED. They deny any other aggravating or alleviating factors.    ED Course: Knee films were negative. She still complained about knee pain. She became hypoxic while ambulating. CXR was positive for PNA. She was started on doxy. [facility] was called for admission.   Review of Systems:  Review of systems is otherwise negative for all not mentioned in HPI.   PMHx Past Medical History:  Diagnosis Date   Allergy    Anemia    Anxiety    Arthritis    right knee;injections every 66months    Asthma    use daily   Breast cancer ([place]) 1994/1995   HX BREAST CANCER/ right brreast and left breast in 1995   Bronchiectasis ([place])    COMPRESSION FRACTURE, LUMBAR VERTEBRAE 08/21/2008   Qualifier: Diagnosis of  By: Arnoldo Morale MD, Balinda Quails    Depression    Diabetes mellitus    takes Amaryl and Januvia daily   Diverticulitis    Dyspnea    with activity   Early cataracts, bilateral    Eczema    Endometriosis    Fatty liver    Gastritis    Glaucoma     Hammer toe    History of kidney stones    Hyperlipidemia associated with type 2 diabetes mellitus ([place]) 07/26/2007   Diet/exercise control.     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   Thyroid disease    Vitamin D deficiency    takes Vit d daily   Weakness    uses a walker and wheelchair    PSHx Past Surgical History:  Procedure Laterality Date   ADENOIDECTOMY  at age 13   ANAL FISTULOTOMY N/A 03/07/2020   Procedure: ANAL FISTULOTOMY WITH MARSUPIALIZATION;  Surgeon: Karie Soda, MD;  Location: [facility];  Service: General;  Laterality: N/A;   BIOPSY  12/19/2018   Procedure: BIOPSY;  Surgeon: Napoleon Form, MD;  Location: [facility];  Service: Endoscopy;;   CATARACT EXTRACTION     CHOLECYSTECTOMY  06/2008   COLONOSCOPY WITH PROPOFOL N/A 05/17/2017   Procedure: COLONOSCOPY WITH PROPOFOL;  Surgeon: Napoleon Form, MD;  Location: [facility];  Service: Endoscopy;  Laterality: N/A;  PT WILL BE ADMITTED THE DAY BEFORE FOR PREP PER ROBIN KB   COLONOSCOPY WITH PROPOFOL N/A 12/19/2018   Procedure: COLONOSCOPY WITH PROPOFOL;  Surgeon: Napoleon Form, MD;  Location: [facility];  Service: Endoscopy;  Laterality: N/A;   DILATION AND CURETTAGE OF UTERUS     excision on breast     internal infected suture from breast surgery   excision removed from  neck  2008   infected lymph node   HEMORRHOID SURGERY N/A 11/22/2019   Procedure: HEMORRHOIDECTOMY LIGATION , PEXY;  Surgeon: Karie Soda, MD;  Location: [facility];  Service: General;  Laterality: N/A;   HYPERBARIC OXYGEN THERAPY     FEET INFECTION   HYSTEROSCOPY  06/22/2011   PMB submucosal myoma   JOINT REPLACEMENT  2014   left total shoulder   LAPAROSCOPIC OOPHORECTOMY Right 12/2004   absent LSO   LAPAROTOMY  1977   LIGATION OF INTERNAL FISTULA TRACT N/A 11/22/2019   Procedure: REPAIR OF PERIRECTAL FISTULA, ANORECTAL EXAMINATION UNDER ANESTHESIA;  Surgeon: Karie Soda, MD;  Location: [facility];  Service: General;  Laterality: N/A;   MASTECTOMY Bilateral 1994 right, 1995 left   PLACEMENT OF SETON N/A 03/19/2021   Procedure: LIMBERG RHOMBOID ROTATIONAL FLAP CLOSURE;  Surgeon: Karie Soda, MD;  Location: [facility];  Service: General;  Laterality: N/A;   POLYPECTOMY  12/19/2018   Procedure: POLYPECTOMY;  Surgeon: Napoleon Form, MD;  [facility];  Service: Endoscopy;;   RECTAL EXAM UNDER ANESTHESIA N/A 03/07/2020   Procedure: ANORECTAL EXAM UNDER ANESTHESIA;  Surgeon: Karie Soda, MD;  Location: [facility];  Service: General;  Laterality: N/A;   RECTAL EXAM UNDER ANESTHESIA N/A 03/19/2021   Procedure: RECTAL EXAM UNDER ANESTHESIA;  Surgeon: Karie Soda, MD;  Location: [facility];  Service: General;  Laterality: N/A;   SHOULDER HEMI-ARTHROPLASTY  01/01/2012   Procedure: SHOULDER HEMI-ARTHROPLASTY;  Surgeon: Dominica Severin, MD;  Location: [facility];  Service: Orthopedics;  Laterality: Left;  Left Shoulder Hemi Arthroplasty with Repair and Reconstruction as Necessary    THYROIDECTOMY  1975   46yrs ago. follows endocrine   TONSILLECTOMY     as a child   TOTAL KNEE ARTHROPLASTY Right 12/30/2014   Procedure: RIGHT TOTAL KNEE ARTHROPLASTY;  Surgeon: Ollen Gross, MD;  Location: [facility];  Service: Orthopedics;  Laterality: Right;    TOTAL KNEE ARTHROPLASTY Left 11/29/2016   Procedure: LEFT TOTAL KNEE ARTHROPLASTY;  Surgeon: Ollen Gross, MD;  Location: [facility];  Service: Orthopedics;  Laterality: Left;    SocHx  reports that she has never smoked. She has never used smokeless tobacco. She reports that she does not drink alcohol and does not use drugs.  Allergies  Allergen Reactions   Nitrofurantoin Other (See Comments)    Severe headache HEADACHE   Levaquin [Levofloxacin In D5w] Other (See Comments)    Pain in tendons    Brimonidine Other (See Comments)  Made eyes and surrounding areas RED/ was an eye drop   Cephalexin Diarrhea and Other (See Comments)    Patient can't remember reaction (per chart at [facility] states diarrhea) (tolerates Augmentin fine)   Erythromycin Ethylsuccinate Hives and Diarrhea   Oxycodone Itching    FamHx Family History  Problem Relation Age of Onset   Arthritis Mother    COPD Father    Heart disease Father        MI 48 - states he died of lockjaw   Hypertension Father    Hyperlipidemia Father    Pancreatic cancer Paternal Grandfather    Colon cancer Neg Hx    Esophageal cancer Neg Hx    Rectal cancer Neg Hx    Stomach cancer Neg Hx     Prior to Admission medications   Medication Sig Start Date End Date Taking? Authorizing Provider  atenolol (TENORMIN) 25 MG tablet Take 1 tablet by mouth once daily Patient taking differently: Take 25 mg by mouth daily. 11/28/20  Yes Marin Olp, MD  budesonide-formoterol [facility]) 80-4.5 MCG/ACT inhaler Inhale 2 puffs into the lungs 2 (two) times daily. 02/11/21  Yes Jeanie Sewer, NP  cetirizine (ZYRTEC) 10 MG tablet Take 10 mg by mouth every morning.    Yes [provider]  Cholecalciferol (VITAMIN D3) 125 MCG (5000 UT) TABS Take 5,000 Units by mouth daily.   Yes [provider]  Coenzyme Q10 (COQ10) 200 MG CAPS Take 200 mg by mouth daily.   Yes [provider]  desvenlafaxine (PRISTIQ) 50 MG 24 hr  tablet Take 1 tablet (50 mg total) by mouth daily. 03/04/21  Yes Marin Olp, MD  dicyclomine (BENTYL) 20 MG tablet Take 1 tablet (20 mg total) by mouth in the morning, at noon, in the evening, and at bedtime. 05/23/20  Yes Nandigam, Venia Minks, MD  dorzolamide-timolol (COSOPT) 22.3-6.8 MG/ML ophthalmic solution Place 1 drop into both eyes 2 (two) times daily.  02/08/13  Yes [provider]  gabapentin (NEURONTIN) 100 MG capsule TAKE 1 CAPSULE BY MOUTH THREE TIMES DAILY AS NEEDED FOR  NEUROPATHIC  PAIN. Patient taking differently: Take 200 mg by mouth 2 (two) times daily. 12/04/20  Yes Marin Olp, MD  insulin degludec (TRESIBA) 200 UNIT/ML FlexTouch Pen Inject 60-80 Units into the skin in the morning. Patient taking differently: Inject 80 Units into the skin in the morning. 01/22/21  Yes Marin Olp, MD  latanoprost (XALATAN) 0.005 % ophthalmic solution Place 1 drop into the right eye at bedtime.  05/20/18  Yes [provider]  lisinopril (ZESTRIL) 10 MG tablet Take 1 tablet (10 mg total) by mouth daily. 10/29/20  Yes Marin Olp, MD  lovastatin (MEVACOR) 10 MG tablet Take 1 tablet (10 mg total) by mouth daily. 08/11/20  Yes Marin Olp, MD  Misc Natural Products (NEURIVA PO) Take 1 capsule by mouth daily.   Yes [provider]  Multiple Vitamin (MULTIVITAMIN WITH MINERALS) TABS tablet Take 3 tablets by mouth 2 (two) times daily. 3 Fruit Capsules 3 Vegetable Capsules - Balance of Nature   Yes [provider]  pantoprazole (PROTONIX) 40 MG tablet Take 1 tablet (40 mg total) by mouth daily. Take 1 hour around food. 10/17/20  Yes Collene Gobble, MD  Selenium 200 MCG CAPS Take 200 mcg by mouth at bedtime.    Yes [provider]  vitamin B-12 (CYANOCOBALAMIN) 1000 MCG tablet Take 1,000 mcg by mouth daily.   Yes [provider]  ACCU-CHEK GUIDE test strip USE 1 TO TEST UP TO TWICE DAILY 05/14/20   Marin Olp, MD  Bacillus  Coagulans-Inulin (ALIGN PREBIOTIC-PROBIOTIC PO) Take 1 capsule by mouth daily.     [provider]  blood glucose meter kit and supplies KIT Dispense based on patient and insurance preference. Use up to four times daily as directed. Dx E11.9 08/21/19   Marin Olp, MD  colesevelam (WELCHOL) 625 MG tablet TAKE 1 TABLET BY MOUTH ONCE DAILY IN THE MORNING AND AT NOON AND AT BEDTIME Patient taking differently: Take 625 mg by mouth 3 (three) times daily. 05/23/20   Mauri Pole, MD  ESTRING 2 MG vaginal ring INSERT 1 RING  INTO VAGINA EVERY 3 MONTHS 07/29/20   Megan Salon, MD  FIBER PO Take 2 tablets by mouth every evening.    [provider]  fluconazole (DIFLUCAN) 150 MG tablet Take 1 tablet (150 mg total) by mouth once as needed for up to 1 dose. Patient not taking: No sig reported 09/01/20   Jaquita Folds, MD  glimepiride (AMARYL) 4 MG tablet TAKE 2 TABLETS BY MOUTH ONCE DAILY WITH BREAKFAST 03/20/21   Marin Olp, MD  glucose blood (ACCU-CHEK GUIDE) test strip USE 1 ONCE DAILY TO TEST BLOOD SUGAR 02/25/21   Marin Olp, MD  HYDROcodone-acetaminophen (NORCO) 5-325 MG tablet Take 1-2 tablets by mouth every 6 (six) hours as needed for moderate pain or severe pain. 03/19/21   Michael Boston, MD  Insulin Pen Needle (NOVOFINE PLUS PEN NEEDLE) 32G X 4 MM MISC 1 each by Does not apply route daily. 08/11/20   Marin Olp, MD  Lancets Misc. (ACCU-CHEK FASTCLIX LANCET) KIT Use to test blood sugars daily. Dx: E11.9 08/23/19   Marin Olp, MD  pramipexole (MIRAPEX) 0.5 MG tablet TAKE 1 TABLET BY MOUTH AT BEDTIME Patient taking differently: Take 0.5 mg by mouth at bedtime. 01/23/21   Marin Olp, MD  Respiratory Therapy Supplies (FLUTTER) DEVI Use as directed 01/08/16   Juanito Doom, MD  ROCKLATAN 0.02-0.005 % SOLN Place 1 drop into the left eye at bedtime.  01/02/20   [provider]  sulfamethoxazole-trimethoprim (BACTRIM) 400-80 MG  tablet Take 1 tablet by mouth daily. 12/29/20   Megan Salon, MD  temazepam (RESTORIL) 15 MG capsule TAKE 1 CAPSULE BY MOUTH AT BEDTIME AS NEEDED FOR SLEEP Patient taking differently: Take 15 mg by mouth at bedtime. 01/29/21   Marin Olp, MD  UNABLE TO FIND Take 2 tablets by mouth at bedtime. Fiber therapy    [provider]    Physical Exam: Vitals:   03/21/21 0923 03/21/21 1038 03/21/21 1225 03/21/21 1522  BP: (!) 153/52 (!) 149/61 (!) 151/50 (!) 171/56  Pulse: (!) 102 98 (!) 102 (!) 120  Resp: $Remo'18 18 19 17  'QgZOh$ Temp: 98.6 F (37 C)   99.7 F (37.6 C)  TempSrc: Oral   Oral  SpO2: 95% 97% 99% 98%  Weight: 122.3 kg     Height: 6' (1.829 m)       General: 81 y.o. female resting in bed in NAD Eyes: PERRL, normal sclera ENMT: Nares patent w/o discharge, orophaynx clear, dentition normal, ears w/o discharge/lesions/ulcers Neck: Supple, trachea midline Cardiovascular: RRR, +S1, S2, no g/r, 2/6 SEM, equal pulses throughout Respiratory: decreased at bases, no w/r/r, normal WOB GI: BS+, NDNT, no masses noted, no organomegaly noted MSK: No e/c/c; limited ROM d/t pain Skin: No rashes,  bruises, ulcerations noted Neuro: A&O x 3, no focal deficits Psyc: Appropriate interaction and affect, calm/cooperative  Labs on Admission: I have personally reviewed following labs and imaging studies  CBC: Recent Labs  Lab 03/21/21 0938 03/21/21 1011  WBC 11.1*  --   HGB 11.4* 11.9*  HCT 36.9 35.0*  MCV 94.1  --   PLT 240  --    Basic Metabolic Panel: Recent Labs  Lab 03/21/21 0938 03/21/21 1011  NA 136 135  K 3.8 3.7  CL 95*  --   CO2 32  --   GLUCOSE 181*  --   BUN 24*  --   CREATININE 1.37*  --   CALCIUM 8.7*  --    GFR: Estimated Creatinine Clearance: 47.2 mL/min (A) (by C-G formula based on SCr of 1.37 mg/dL (H)). Liver Function Tests: No results for input(s): AST, ALT, ALKPHOS, BILITOT, PROT, ALBUMIN in the last 168 hours. No results for input(s): LIPASE, AMYLASE  in the last 168 hours. No results for input(s): AMMONIA in the last 168 hours. Coagulation Profile: No results for input(s): INR, PROTIME in the last 168 hours. Cardiac Enzymes: No results for input(s): CKTOTAL, CKMB, CKMBINDEX, TROPONINI in the last 168 hours. BNP (last 3 results) Recent Labs    10/29/20 1452  PROBNP 17.0   HbA1C: No results for input(s): HGBA1C in the last 72 hours. CBG: Recent Labs  Lab 03/19/21 0556 03/19/21 0936 03/21/21 1316  GLUCAP 155* 154* 198*   Lipid Profile: No results for input(s): CHOL, HDL, LDLCALC, TRIG, CHOLHDL, LDLDIRECT in the last 72 hours. Thyroid Function Tests: No results for input(s): TSH, T4TOTAL, FREET4, T3FREE, THYROIDAB in the last 72 hours. Anemia Panel: No results for input(s): VITAMINB12, FOLATE, FERRITIN, TIBC, IRON, RETICCTPCT in the last 72 hours. Urine analysis:    Component Value Date/Time   COLORURINE YELLOW 02/04/2020 1155   APPEARANCEUR TURBID (A) 02/04/2020 1155   LABSPEC 1.021 02/04/2020 1155   PHURINE 5.0 02/04/2020 1155   GLUCOSEU NEGATIVE 02/04/2020 1155   HGBUR LARGE (A) 02/04/2020 1155   BILIRUBINUR Negative 08/29/2020 1545   KETONESUR NEGATIVE 02/04/2020 1155   PROTEINUR Negative 08/29/2020 1545   PROTEINUR 100 (A) 02/04/2020 1155   UROBILINOGEN 0.2 08/29/2020 1545   UROBILINOGEN 1.0 12/24/2014 1430   NITRITE Negative 08/29/2020 1545   NITRITE NEGATIVE 02/04/2020 1155   LEUKOCYTESUR Negative 08/29/2020 1545   LEUKOCYTESUR SMALL (A) 02/04/2020 1155    Radiological Exams on Admission: CT Angio Chest PE W and/or Wo Contrast  Result Date: 03/21/2021 CLINICAL DATA:  Shortness of breath, hypoxia EXAM: CT ANGIOGRAPHY CHEST WITH CONTRAST TECHNIQUE: Multidetector CT imaging of the chest was performed using the standard protocol during bolus administration of intravenous contrast. Multiplanar CT image reconstructions and MIPs were obtained to evaluate the vascular anatomy. CONTRAST:  23mL OMNIPAQUE IOHEXOL 350  MG/ML SOLN COMPARISON:  01/26/2017 FINDINGS: Cardiovascular: Scattered coronary artery calcifications are seen. There is homogeneous enhancement in thoracic aorta. Are no intraluminal filling defects in the central pulmonary artery branches. Evaluation of peripheral branches is limited by motion artifacts. Mediastinum/Nodes: No significant lymphadenopathy seen. There is inhomogeneous attenuation in the thyroid with possible low-density nodules. Lungs/Pleura: Small patchy infiltrates are seen in the posterior lower lung fields. There is no focal pulmonary consolidation. There is no significant pleural effusion or pneumothorax. Upper Abdomen: There are low-density foci in the liver which are not fully evaluated. This finding was not seen in the previous CT done on 01/26/2017. Surgical clips are seen in gallbladder fossa. Musculoskeletal:  Unremarkable. Review of the MIP images confirms the above findings. IMPRESSION: There is no evidence of central pulmonary artery embolism. Evaluation of small peripheral branches is limited by motion artifacts, especially in the lower lung fields. There are new small patchy infiltrates in the lower lung fields suggesting subsegmental atelectasis/pneumonitis. There is no pleural effusion or pneumothorax. There are scattered coronary artery calcifications. There are multiple low-density foci of varying sizes in the liver. This may suggest scarring from previous intervention or inflammatory or neoplastic process. Follow-up multiphasic CT when the patient's clinical condition permits should be considered. Electronically Signed   By: Elmer Picker M.D.   On: 03/21/2021 12:03   DG Chest Port 1 View  Result Date: 03/21/2021 CLINICAL DATA:  Pain after fall.  History of hip replacement EXAM: PORTABLE CHEST 1 VIEW COMPARISON:  04/11/2018 FINDINGS: Normal heart size and mediastinal contours. Mild streaky density at the left base. No edema, effusion, or pneumothorax. Normal heart size.  Mild aortic tortuosity. No effusion or pneumothorax. No acute osseous findings. IMPRESSION: Mild atelectasis. Electronically Signed   By: Jorje Guild M.D.   On: 03/21/2021 09:54   DG Knee Complete 4 Views Right  Result Date: 03/21/2021 CLINICAL DATA:  pain after fall, h/o knee replacement EXAM: RIGHT KNEE - COMPLETE 4+ VIEW COMPARISON:  Radiograph 12/05/2016 FINDINGS: There is a 3 component total knee arthroplasty in normal alignment without evidence of loosening or periprosthetic fracture. Trace joint fluid. IMPRESSION: No evidence of right knee arthroplasty complication. No acute osseous abnormality. Electronically Signed   By: Maurine Simmering M.D.   On: 03/21/2021 09:56    EKG: Independently reviewed. Sinus tachy, no st elevations  Assessment/Plan PNA     - place in med-surg, obs     - continue doxy, add augmentin     - guaifenesin  Right knee pain after fall     - films are negative for fracture     - will get PT/OT to see  HTN     - hold lisinopril d/t AKI; continue atenolol  AKI     - fluids, check renal US    Urinary retention     - has foley placed for urinary retention post surgery; can try voiding trial after renal US  Neuropathy     - continue home regimen  Glaucoma     - continue home regimen  MDD     - continue home regimen  DVT prophylaxis: lovenox  Code Status: FULL  Family Communication: w/ husband at bedside  Consults called: None   Status is: Observation  The patient remains OBS appropriate and will d/c before 2 midnights.  Jonnie Finner DO [facility]  If 7PM-7AM, please contact night-coverage [email or web address]  03/21/2021, 4:00 PM

## 2021-03-21 NOTE — Plan of Care (Signed)
  Problem: Education: Goal: Knowledge of General Education information will improve Description: Including pain rating scale, medication(s)/side effects and non-pharmacologic comfort measures Outcome: Progressing   Problem: Activity: Goal: Risk for activity intolerance will decrease Outcome: Progressing   Problem: Pain Managment: Goal: General experience of comfort will improve Outcome: Progressing   

## 2021-03-21 NOTE — Progress Notes (Signed)
   03/21/21 1522  Assess: MEWS Score  Temp 99.7 F (37.6 C)  BP (!) 171/56  Pulse Rate (!) 120  Resp 17  SpO2 98 %  Assess: MEWS Score  MEWS Temp 0  MEWS Systolic 0  MEWS Pulse 2  MEWS RR 0  MEWS LOC 0  MEWS Score 2  MEWS Score Color Yellow  Assess: if the MEWS score is Yellow or Red  Were vital signs taken at a resting state? Yes  Focused Assessment No change from prior assessment  Does the patient meet 2 or more of the SIRS criteria? No  MEWS guidelines implemented *See Row Information* Yes  Treat  MEWS Interventions Other (Comment)  Pain Scale 0-10  Pain Score 0  Take Vital Signs  Increase Vital Sign Frequency  Yellow: Q 2hr X 2 then Q 4hr X 2, if remains yellow, continue Q 4hrs  Escalate  MEWS: Escalate Yellow: discuss with charge nurse/RN and consider discussing with provider and RRT  Notify: Charge Nurse/RN  Name of Charge Nurse/RN Notified Deanna Artis  Date Charge Nurse/RN Notified 03/21/21  Time Charge Nurse/RN Notified 1530  Assess: SIRS CRITERIA  SIRS Temperature  0  SIRS Pulse 1  SIRS Respirations  0  SIRS WBC 0  SIRS Score Sum  1  Patient to be admitted/accepted by [facility] physician. Once done I will contact to inform them of MEWS. No change in VS from [facility]

## 2021-03-21 NOTE — ED Notes (Signed)
RT Note: Blood Sugar obtained via finger stick/downloaded/RN made aware of results.

## 2021-03-21 NOTE — Progress Notes (Signed)
Notified by EDP of need for admission d/t PNA. [facility] accepts patient to med-surg at [facility]. EDP is to remain responsible for orders/medical decisions while patient is holding at [facility]. Upon arrival to [facility], [facility] will assume care. Nursing staff will call patient placement to notify them of patient's arrival so that the proper [facility] member may receive the patient. Thank you.

## 2021-03-21 NOTE — ED Provider Notes (Signed)
[facility] EMERGENCY DEPT Provider Note   CSN: [id number] Arrival date & time: 03/21/21  0913     History Chief Complaint  Patient presents with   [name] is a 80 y.o. female.  HPI    80 year old female history of right knee replacement, breast cancer, diabetes,, recent surgery for rectal fissure with subsequent urinary retention requiring Foley catheter placement.  Patient was seen at [facility] surgery yesterday and had a catheter placed.  After returning home, she was transferring with assistance from her husband and son when she fell injuring her right knee.  She denies any other injury.  The transfer was in normal assist.  She replies that she lost her balance this do not think she had any episode that caused a decreased level of consciousness to precipitate the fall.  She denies any headache, head injury, neck pain, chest pain, dyspnea, back injury or pain, abdominal pain, nausea, vomiting, or diarrhea.  She states that she has pain only in the right knee and had a prior knee replacemen.  She is not on any blood thinners.  EMS came out last night and she declined transport.  They were again called today and transported her here for further evaluation.  They did note that her oxygen saturations were low at 88% and put her on a couple of liters prior to arrival.  She states that she has a history of bronchiectasis and always has a cough.  She has not noted any change in the sputum production, dyspnea, change in her cough, fever, or chills.  She does not feel dyspneic prior to or after oxygen placement. Past Medical History:  Diagnosis Date   Allergy    Anemia    Anxiety    Arthritis    right knee;injections every 68month    Asthma    use daily   [place] 1994[place]   HX BREAST CANCER/ right brreast and left breast in 1995   [place]chiectasis ([place]    COMPRESSION FRACTURE, LUMBAR VERTEBRAE 08/21/2008   Qualifier: Diagnosis of  By:  JArnoldo MoraleMD, JBalinda Quails   Depression    Diabetes mellitus    takes Amaryl and Januvia daily   Diverticulitis    Dyspnea    with activity   Early cataracts, bilateral    Eczema    Endometriosis    Fatty liver    Gastritis    Glaucoma    Hammer toe    History of kidney stones    Hyperlipidemia associated with type 2 diabetes mellitus ([place] 07/26/2007   Diet/exercise control.     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   Thyroid disease    Vitamin D  deficiency    takes Vit d daily   Weakness    uses a walker and wheelchair    Patient Active Problem List   Diagnosis Date Noted   Chronic UTI 02/23/2021   Heart palpitations 02/11/2021   Ankle edema, bilateral 02/11/2021   Hoarseness 10/17/2020   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 11/22/2019   Healthcare maintenance 02/22/2019   Physical deconditioning 02/22/2019   SOB (shortness of breath) on exertion 02/22/2019   Adenomatous polyp 12/20/2018   Pain due to onychomycosis of toenails of both feet 11/08/2018   Diabetic neuropathy ([place]) 11/08/2018   History of total knee replacement, bilateral 11/14/2017   Personal history of colonic polyps 05/18/2017   Arthritis of midfoot 03/03/2016   Hammertoes of both feet 03/03/2016   Aortic atherosclerosis ([place]) 02/16/2016   Solitary pulmonary nodule 01/08/2016    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   Bronchiectasis ([place]) 10/18/2015   IBS (irritable bowel syndrome) 07/17/2014   Recurrent major depression in full remission ([place]) 01/03/2014   Posterior tibial tendon dysfunction 01/03/2014   Osteoarthritis, knee 01/03/2014   Glaucoma 01/03/2014   Hypertension associated with diabetes ([place]) 01/03/2014   Obesity (BMI 30-39.9) 06/15/2013   Primary open-angle glaucoma 08/02/2012   Foot tendinitis 07/20/2010   INSOMNIA, CHRONIC 07/25/2009   Fatty liver 03/18/2009   GERD 12/25/2007   Hyperlipidemia associated with type 2 diabetes mellitus ([place]) 07/26/2007   ANEMIA, B12 DEFICIENCY 12/29/2006   Diabetes mellitus type II, controlled ([place]) 12/28/2006   RESTLESS LEG SYNDROME, MILD 12/28/2006   NEUROPATHY, IDIOPATHIC PERIPHERAL NEC 12/28/2006   Osteoporosis 12/12/2006   BREAST CANCER, HX OF 12/12/2006    Past Surgical History:  Procedure Laterality Date   ADENOIDECTOMY     at age 38   ANAL FISTULOTOMY N/A 03/07/2020   Procedure: ANAL FISTULOTOMY WITH MARSUPIALIZATION;  Surgeon: Michael Boston, MD;  Location[facility];  Service: General;  Laterality: N/A;   BIOPSY  12/19/2018   Procedure: BIOPSY;  Surgeon: Mauri Pole, MD;  Location: [facility];  Service: Endoscopy;;   CATARACT EXTRACTION     CHOLECYSTECTOMY  06/2008   COLONOSCOPY WITH PROPOFOL N/A 05/17/2017   Procedure: COLONOSCOPY WITH PROPOFOL;  Surgeon: Mauri Pole, MD;  Location: [facility];  Service: Endoscopy;  Laterality: N/A;  PT WILL BE ADMITTED THE DAY BEFORE FOR PREP PER ROBIN KB   COLONOSCOPY WITH PROPOFOL N/A 12/19/2018   Procedure: COLONOSCOPY WITH PROPOFOL;  Surgeon: Mauri Pole, MD;  [facility]: [facility];  Service: Endoscopy;  Laterality: N/A;   DILATION AND CURETTAGE OF UTERUS     excision on breast     internal infected suture from breast surgery   excision removed from neck  2008   infected lymph node   HEMORRHOID SURGERY N/A 11/22/2019    Procedure: HEMORRHOIDECTOMY LIGATION , PEXY;  Surgeon: Michael Boston, MD;  Location: [facility];  Service: General;  Laterality: N/A;   HYPERBARIC OXYGEN THERAPY     FEET INFECTION   HYSTEROSCOPY  06/22/2011   PMB submucosal myoma   JOINT REPLACEMENT  2014   left total shoulder   LAPAROSCOPIC OOPHORECTOMY Right 12/2004   absent LSO   LAPAROTOMY  1977   LIGATION OF INTERNAL FISTULA TRACT N/A 11/22/2019   Procedure: REPAIR OF PERIRECTAL FISTULA, ANORECTAL EXAMINATION UNDER ANESTHESIA;  Surgeon: Michael Boston, MD;  Location: [facility];  Service: General;  Laterality: N/A;   MASTECTOMY Bilateral 1994 right, 1995 left   PLACEMENT OF SETON N/A 03/19/2021  Procedure: LIMBERG RHOMBOID ROTATIONAL FLAP CLOSURE;  Surgeon: Michael Boston, MD;  Location: [facility];  Service: General;  Laterality: N/A;   POLYPECTOMY  12/19/2018   Procedure: POLYPECTOMY;  Surgeon: Mauri Pole, MD;  [facility];  Service: Endoscopy;;   RECTAL EXAM UNDER ANESTHESIA N/A 03/07/2020   Procedure: ANORECTAL EXAM UNDER ANESTHESIA;  Surgeon: Michael Boston, MD;  Location: [place];  Service: General;  Laterality: N/A;   RECTAL EXAM UNDER ANESTHESIA N/A 03/19/2021   Procedure: RECTAL EXAM UNDER ANESTHESIA;  Surgeon: Michael Boston, MD;  [facility];  Service: General;  Laterality: N/A;   SHOULDER HEMI-ARTHROPLASTY  01/01/2012   Procedure: SHOULDER HEMI-ARTHROPLASTY;  Surgeon: Roseanne Kaufman, MD;  Location: [facility];  Service: Orthopedics;  Laterality: Left;  Left Shoulder Hemi Arthroplasty with Repair and Reconstruction as Necessary    THYROIDECTOMY  1975   45yr ago. follows endocrine   TONSILLECTOMY     as a child   TOTAL KNEE ARTHROPLASTY Right 12/30/2014   Procedure: RIGHT TOTAL KNEE ARTHROPLASTY;  Surgeon: FGaynelle Arabian MD;  [facility];  Service: Orthopedics;  Laterality: Right;   TOTAL KNEE ARTHROPLASTY Left 11/29/2016   Procedure: LEFT TOTAL KNEE  ARTHROPLASTY;  Surgeon: AGaynelle Arabian MD;  [facility];  Service: Orthopedics;  Laterality: Left;     OB History     Gravida  0   Para      Term      Preterm      AB      Living  0      SAB      IAB      Ectopic      Multiple      Live Births           Obstetric Comments  1 adopted         Family History  Problem Relation Age of Onset   Arthritis Mother    COPD Father    Heart disease Father        MI 845- states he died of lockjaw   Hypertension Father    Hyperlipidemia Father    Pancreatic cancer Paternal Grandfather    Colon cancer Neg Hx    Esophageal cancer Neg Hx    Rectal cancer Neg Hx    Stomach cancer Neg Hx     Social History   Tobacco Use   Smoking status: Never   Smokeless tobacco: Never  Vaping Use   Vaping Use: Never used  Substance Use Topics   Alcohol use: No   Drug use: No    Home Medications Prior to Admission medications   Medication Sig Start Date End Date Taking? Authorizing Provider  atenolol (TENORMIN) 25 MG tablet Take 1 tablet by mouth once daily Patient taking differently: Take 25 mg by mouth daily. 11/28/20  Yes HMarin Olp MD  budesonide-formoterol ([facility] 80-4.5 MCG/ACT inhaler Inhale 2 puffs into the lungs 2 (two) times daily. 02/11/21  Yes HJeanie Sewer NP  cetirizine (ZYRTEC) 10 MG tablet Take 10 mg by mouth every morning.    Yes [provider]  Cholecalciferol (VITAMIN D3) 125 MCG (5000 UT) TABS Take 5,000 Units by mouth daily.   Yes [provider]  Coenzyme Q10 (COQ10) 200 MG CAPS Take 200 mg by mouth daily.   Yes [provider]  desvenlafaxine (PRISTIQ) 50 MG 24 hr tablet Take 1 tablet (50 mg total) by mouth daily. 03/04/21  Yes HMarin Olp MD  dicyclomine (BENTYL) 20 MG tablet Take 1 tablet (20 mg total) by mouth in the morning, at noon, in the evening, and at bedtime. 05/23/20  Yes Nandigam, Venia Minks, MD  dorzolamide-timolol (COSOPT) 22.3-6.8  MG/ML ophthalmic solution Place 1 drop into both eyes 2 (two) times daily.  02/08/13  Yes [provider]  gabapentin (NEURONTIN) 100 MG capsule TAKE 1 CAPSULE BY MOUTH THREE TIMES DAILY AS NEEDED FOR  NEUROPATHIC  PAIN. Patient taking differently: Take 200 mg by mouth 2 (two) times daily. 12/04/20  Yes Marin Olp, MD  insulin degludec (TRESIBA) 200 UNIT/ML FlexTouch Pen Inject 60-80 Units into the skin in the morning. Patient taking differently: Inject 80 Units into the skin in the morning. 01/22/21  Yes Marin Olp, MD  latanoprost (XALATAN) 0.005 % ophthalmic solution Place 1 drop into the right eye at bedtime.  05/20/18  Yes [provider]  lisinopril (ZESTRIL) 10 MG tablet Take 1 tablet (10 mg total) by mouth daily. 10/29/20  Yes Marin Olp, MD  lovastatin (MEVACOR) 10 MG tablet Take 1 tablet (10 mg total) by mouth daily. 08/11/20  Yes Marin Olp, MD  Misc Natural Products (NEURIVA PO) Take 1 capsule by mouth daily.   Yes [provider]  Multiple Vitamin (MULTIVITAMIN WITH MINERALS) TABS tablet Take 3 tablets by mouth 2 (two) times daily. 3 Fruit Capsules 3 Vegetable Capsules - Balance of Nature   Yes [provider]  pantoprazole (PROTONIX) 40 MG tablet Take 1 tablet (40 mg total) by mouth daily. Take 1 hour around food. 10/17/20  Yes Collene Gobble, MD  Selenium 200 MCG CAPS Take 200 mcg by mouth at bedtime.    Yes [provider]  vitamin B-12 (CYANOCOBALAMIN) 1000 MCG tablet Take 1,000 mcg by mouth daily.   Yes [provider]  ACCU-CHEK GUIDE test strip USE 1 TO TEST UP TO TWICE DAILY 05/14/20   Marin Olp, MD  Bacillus Coagulans-Inulin (ALIGN PREBIOTIC-PROBIOTIC PO) Take 1 capsule by mouth daily.     [provider]  blood glucose meter kit and supplies KIT Dispense based on patient and insurance preference. Use up to four times daily as directed. Dx E11.9 08/21/19   Marin Olp, MD  colesevelam  (WELCHOL) 625 MG tablet TAKE 1 TABLET BY MOUTH ONCE DAILY IN THE MORNING AND AT NOON AND AT BEDTIME Patient taking differently: Take 625 mg by mouth 3 (three) times daily. 05/23/20   Mauri Pole, MD  ESTRING 2 MG vaginal ring INSERT 1 RING  INTO VAGINA EVERY 3 MONTHS 07/29/20   Megan Salon, MD  FIBER PO Take 2 tablets by mouth every evening.    [provider]  fluconazole (DIFLUCAN) 150 MG tablet Take 1 tablet (150 mg total) by mouth once as needed for up to 1 dose. Patient not taking: No sig reported 09/01/20   Jaquita Folds, MD  glimepiride (AMARYL) 4 MG tablet TAKE 2 TABLETS BY MOUTH ONCE DAILY WITH BREAKFAST 03/20/21   Marin Olp, MD  glucose blood (ACCU-CHEK GUIDE) test strip USE 1 ONCE DAILY TO TEST BLOOD SUGAR 02/25/21   Marin Olp, MD  HYDROcodone-acetaminophen (NORCO) 5-325 MG tablet Take 1-2 tablets by mouth every 6 (six) hours as needed for moderate pain or severe pain. 03/19/21   Michael Boston, MD  Insulin Pen Needle (NOVOFINE PLUS PEN NEEDLE) 32G X 4 MM MISC 1 each by Does not apply route daily. 08/11/20   [name]  O, MD  Lancets Misc. (ACCU-CHEK FASTCLIX LANCET) KIT Use to test blood sugars daily. Dx: E11.9 08/23/19   Marin Olp, MD  pramipexole (MIRAPEX) 0.5 MG tablet TAKE 1 TABLET BY MOUTH AT BEDTIME Patient taking differently: Take 0.5 mg by mouth at bedtime. 01/23/21   Marin Olp, MD  Respiratory Therapy Supplies (FLUTTER) DEVI Use as directed 01/08/16   Juanito Doom, MD  ROCKLATAN 0.02-0.005 % SOLN Place 1 drop into the left eye at bedtime.  01/02/20   [provider]  sulfamethoxazole-trimethoprim (BACTRIM) 400-80 MG tablet Take 1 tablet by mouth daily. 12/29/20   Megan Salon, MD  temazepam (RESTORIL) 15 MG capsule TAKE 1 CAPSULE BY MOUTH AT BEDTIME AS NEEDED FOR SLEEP Patient taking differently: Take 15 mg by mouth at bedtime. 01/29/21   Marin Olp, MD  UNABLE TO FIND Take 2 tablets by mouth at  bedtime. Fiber therapy    [provider]    Allergies    Nitrofurantoin, Levaquin [levofloxacin in d5w], Brimonidine, Cephalexin, Erythromycin ethylsuccinate, and Oxycodone  Review of Systems   Review of Systems  All other systems reviewed and are negative.  Physical Exam Updated Vital Signs BP (!) 151/50   Pulse (!) 102   Temp 98.6 F (37 C) (Oral)   Resp 19   Ht 1.829 m (6')   Wt 122.3 kg   LMP 05/10/1992 Comment: partial  SpO2 99%   BMI 36.56 kg/m   Physical Exam Vitals and nursing note reviewed.  Constitutional:      General: She is not in acute distress.    Appearance: Normal appearance. She is well-developed. She is obese.  HENT:     Head: Normocephalic and atraumatic.     Right Ear: External ear normal.     Left Ear: External ear normal.     Nose: Nose normal.  Eyes:     Conjunctiva/sclera: Conjunctivae normal.     Pupils: Pupils are equal, round, and reactive to light.  Pulmonary:     Effort: Pulmonary effort is normal.  Musculoskeletal:        General: Normal range of motion.     Cervical back: Normal range of motion and neck supple.     Comments: No obvious external signs of trauma There is tenderness to the lower part of the knee There is no tenderness over the hip There is full active range of motion of hip and Pulses are intact distal to injury There is no skin injury noted  Skin:    General: Skin is warm and dry.  Neurological:     Mental Status: She is alert and oriented to person, place, and time.     Motor: No abnormal muscle tone.     Coordination: Coordination normal.  Psychiatric:        Behavior: Behavior normal.        Thought Content: Thought content normal.    ED Results / Procedures / Treatments   Labs (all labs ordered are listed, but only abnormal results are displayed) Labs Reviewed  CBC - Abnormal; Notable for the following components:      Result Value   WBC 11.1 (*)    Hemoglobin 11.4 (*)    All other  components within normal limits  BASIC METABOLIC PANEL - Abnormal; Notable for the following components:   Chloride 95 (*)    Glucose, Bld 181 (*)    BUN 24 (*)    Creatinine, Ser 1.37 (*)  Calcium 8.7 (*)    GFR, Estimated 39 (*)    All other components within normal limits  I-STAT ARTERIAL BLOOD GAS, ED - Abnormal; Notable for the following components:   pCO2 arterial 56.0 (*)    pO2, Arterial 56 (*)    Bicarbonate 31.7 (*)    TCO2 33 (*)    Acid-Base Excess 5.0 (*)    Calcium, Ion 1.14 (*)    HCT 35.0 (*)    Hemoglobin 11.9 (*)    All other components within normal limits  CBG MONITORING, ED - Abnormal; Notable for the following components:   Glucose-Capillary 198 (*)    All other components within normal limits  RESP PANEL BY RT-PCR (FLU A&B, COVID) ARPGX2    EKG None  Radiology CT Angio Chest PE W and/or Wo Contrast  Result Date: 03/21/2021 CLINICAL DATA:  Shortness of breath, hypoxia EXAM: CT ANGIOGRAPHY CHEST WITH CONTRAST TECHNIQUE: Multidetector CT imaging of the chest was performed using the standard protocol during bolus administration of intravenous contrast. Multiplanar CT image reconstructions and MIPs were obtained to evaluate the vascular anatomy. CONTRAST:  41m OMNIPAQUE IOHEXOL 350 MG/ML SOLN COMPARISON:  01/26/2017 FINDINGS: Cardiovascular: Scattered coronary artery calcifications are seen. There is homogeneous enhancement in thoracic aorta. Are no intraluminal filling defects in the central pulmonary artery branches. Evaluation of peripheral branches is limited by motion artifacts. Mediastinum/Nodes: No significant lymphadenopathy seen. There is inhomogeneous attenuation in the thyroid with possible low-density nodules. Lungs/Pleura: Small patchy infiltrates are seen in the posterior lower lung fields. There is no focal pulmonary consolidation. There is no significant pleural effusion or pneumothorax. Upper Abdomen: There are low-density foci in the liver which  are not fully evaluated. This finding was not seen in the previous CT done on 01/26/2017. Surgical clips are seen in gallbladder fossa. Musculoskeletal: Unremarkable. Review of the MIP images confirms the above findings. IMPRESSION: There is no evidence of central pulmonary artery embolism. Evaluation of small peripheral branches is limited by motion artifacts, especially in the lower lung fields. There are new small patchy infiltrates in the lower lung fields suggesting subsegmental atelectasis/pneumonitis. There is no pleural effusion or pneumothorax. There are scattered coronary artery calcifications. There are multiple low-density foci of varying sizes in the liver. This may suggest scarring from previous intervention or inflammatory or neoplastic process. Follow-up multiphasic CT when the patient's clinical condition permits should be considered. Electronically Signed   By: PElmer PickerM.D.   On: 03/21/2021 12:03   DG Chest Port 1 View  Result Date: 03/21/2021 CLINICAL DATA:  Pain after fall.  History of hip replacement EXAM: PORTABLE CHEST 1 VIEW COMPARISON:  04/11/2018 FINDINGS: Normal heart size and mediastinal contours. Mild streaky density at the left base. No edema, effusion, or pneumothorax. Normal heart size. Mild aortic tortuosity. No effusion or pneumothorax. No acute osseous findings. IMPRESSION: Mild atelectasis. Electronically Signed   By: JJorje GuildM.D.   On: 03/21/2021 09:54   DG Knee Complete 4 Views Right  Result Date: 03/21/2021 CLINICAL DATA:  pain after fall, h/o knee replacement EXAM: RIGHT KNEE - COMPLETE 4+ VIEW COMPARISON:  Radiograph 12/05/2016 FINDINGS: There is a 3 component total knee arthroplasty in normal alignment without evidence of loosening or periprosthetic fracture. Trace joint fluid. IMPRESSION: No evidence of right knee arthroplasty complication. No acute osseous abnormality. Electronically Signed   By: JMaurine SimmeringM.D.   On: 03/21/2021 09:56     Procedures Procedures   Medications Ordered in ED Medications  albuterol (  VENTOLIN HFA) 108 (90 Base) MCG/ACT inhaler 2 puff (has no administration in time range)  doxycycline (VIBRAMYCIN) 100 mg in sodium chloride 0.9 % 250 mL IVPB (100 mg Intravenous New Bag/Given 03/21/21 1259)  lactated ringers bolus 1,000 mL (1,000 mLs Intravenous New Bag/Given 03/21/21 1102)  iohexol (OMNIPAQUE) 350 MG/ML injection 100 mL (59 mLs Intravenous Contrast Given 03/21/21 1121)    ED Course  I have reviewed the triage vital signs and the nursing notes.  Pertinent labs & imaging results that were available during my care of the patient were reviewed by me and considered in my medical decision making (see chart for details).    MDM Rules/Calculators/A&P                        80 year old female with recent surgery now with Foley catheter due to urinary retention presents today after mechanical fall.  On presentation she was found to be hypoxic with sats in the 80s.  She has new oxygen requirement here.  ABG obtained on room air shows pH 7.36, PCO2 56, PO2 of 56.  She desats without oxygen.  She is on oxygen now at 2 L with sats in the 94%.  Mildly tachycardic with heart rate of 102 blood pressure is 151/50.  CTA obtained.  This does not show any evidence of acute PE but does show some patchy infiltrates in the lower lobes.  Given his infiltrates patient was started on doxycycline.  She has had multiple medication reactions in the past and has Rocephin, Zithromax, and Levaquin as allergies. Patient's creatinine is 1.37 her baseline creatinines are under 1. X- of right knee which is ordered patient originally came to the ED for she does not show any evidence of fracture. 1 new respiratory failure with #hypoxia requiring oxygen 2-#1 likely secondary to infection with abnormality seen on CT COVID and flu negative 3 patient with acute kidney injury with creatinine of 1.37 Care discussed with Dr. Marylyn Ishihara who  accepts patient for admission. Final Clinical Impression(s) / ED Diagnoses Final diagnoses:  Hypoxia  AKI (acute kidney injury) [facility])    Rx / [place] Orders ED Discharge Orders     None        Pattricia Boss, MD 03/21/21 1328

## 2021-03-22 DIAGNOSIS — N179 Acute kidney failure, unspecified: Secondary | ICD-10-CM | POA: Diagnosis present

## 2021-03-22 DIAGNOSIS — M25461 Effusion, right knee: Secondary | ICD-10-CM

## 2021-03-22 DIAGNOSIS — E785 Hyperlipidemia, unspecified: Secondary | ICD-10-CM | POA: Diagnosis present

## 2021-03-22 DIAGNOSIS — F3342 Major depressive disorder, recurrent, in full remission: Secondary | ICD-10-CM | POA: Diagnosis present

## 2021-03-22 DIAGNOSIS — J189 Pneumonia, unspecified organism: Secondary | ICD-10-CM | POA: Diagnosis present

## 2021-03-22 DIAGNOSIS — K644 Residual hemorrhoidal skin tags: Secondary | ICD-10-CM | POA: Diagnosis present

## 2021-03-22 DIAGNOSIS — E119 Type 2 diabetes mellitus without complications: Secondary | ICD-10-CM | POA: Diagnosis present

## 2021-03-22 DIAGNOSIS — Z6836 Body mass index (BMI) 36.0-36.9, adult: Secondary | ICD-10-CM | POA: Diagnosis not present

## 2021-03-22 DIAGNOSIS — E1142 Type 2 diabetes mellitus with diabetic polyneuropathy: Secondary | ICD-10-CM | POA: Diagnosis present

## 2021-03-22 DIAGNOSIS — G2581 Restless legs syndrome: Secondary | ICD-10-CM | POA: Diagnosis present

## 2021-03-22 DIAGNOSIS — R159 Full incontinence of feces: Secondary | ICD-10-CM | POA: Diagnosis present

## 2021-03-22 DIAGNOSIS — E890 Postprocedural hypothyroidism: Secondary | ICD-10-CM | POA: Diagnosis present

## 2021-03-22 DIAGNOSIS — K605 Anorectal fistula: Secondary | ICD-10-CM | POA: Diagnosis present

## 2021-03-22 DIAGNOSIS — M6250 Muscle wasting and atrophy, not elsewhere classified, unspecified site: Secondary | ICD-10-CM | POA: Diagnosis present

## 2021-03-22 DIAGNOSIS — Z96612 Presence of left artificial shoulder joint: Secondary | ICD-10-CM | POA: Diagnosis present

## 2021-03-22 DIAGNOSIS — R0902 Hypoxemia: Secondary | ICD-10-CM | POA: Diagnosis present

## 2021-03-22 DIAGNOSIS — M8580 Other specified disorders of bone density and structure, unspecified site: Secondary | ICD-10-CM | POA: Diagnosis present

## 2021-03-22 DIAGNOSIS — W07XXXA Fall from chair, initial encounter: Secondary | ICD-10-CM | POA: Diagnosis present

## 2021-03-22 DIAGNOSIS — H409 Unspecified glaucoma: Secondary | ICD-10-CM | POA: Diagnosis present

## 2021-03-22 DIAGNOSIS — J9601 Acute respiratory failure with hypoxia: Secondary | ICD-10-CM | POA: Diagnosis present

## 2021-03-22 DIAGNOSIS — Z96653 Presence of artificial knee joint, bilateral: Secondary | ICD-10-CM | POA: Diagnosis present

## 2021-03-22 DIAGNOSIS — F419 Anxiety disorder, unspecified: Secondary | ICD-10-CM | POA: Diagnosis present

## 2021-03-22 DIAGNOSIS — I10 Essential (primary) hypertension: Secondary | ICD-10-CM | POA: Diagnosis present

## 2021-03-22 DIAGNOSIS — Z20822 Contact with and (suspected) exposure to covid-19: Secondary | ICD-10-CM | POA: Diagnosis present

## 2021-03-22 DIAGNOSIS — J45909 Unspecified asthma, uncomplicated: Secondary | ICD-10-CM | POA: Diagnosis present

## 2021-03-22 DIAGNOSIS — E669 Obesity, unspecified: Secondary | ICD-10-CM | POA: Diagnosis present

## 2021-03-22 LAB — CBC
HCT: 33.9 % — ABNORMAL LOW (ref 36.0–46.0)
Hemoglobin: 10.6 g/dL — ABNORMAL LOW (ref 12.0–15.0)
MCH: 29.7 pg (ref 26.0–34.0)
MCHC: 31.3 g/dL (ref 30.0–36.0)
MCV: 95 fL (ref 80.0–100.0)
Platelets: 211 10*3/uL (ref 150–400)
RBC: 3.57 MIL/uL — ABNORMAL LOW (ref 3.87–5.11)
RDW: 14.5 % (ref 11.5–15.5)
WBC: 8.4 10*3/uL (ref 4.0–10.5)
nRBC: 0 % (ref 0.0–0.2)

## 2021-03-22 LAB — COMPREHENSIVE METABOLIC PANEL
ALT: 12 U/L (ref 0–44)
AST: 13 U/L — ABNORMAL LOW (ref 15–41)
Albumin: 2.9 g/dL — ABNORMAL LOW (ref 3.5–5.0)
Alkaline Phosphatase: 68 U/L (ref 38–126)
Anion gap: 4 — ABNORMAL LOW (ref 5–15)
BUN: 16 mg/dL (ref 8–23)
CO2: 34 mmol/L — ABNORMAL HIGH (ref 22–32)
Calcium: 8.2 mg/dL — ABNORMAL LOW (ref 8.9–10.3)
Chloride: 102 mmol/L (ref 98–111)
Creatinine, Ser: 0.74 mg/dL (ref 0.44–1.00)
GFR, Estimated: >60 mL/min (ref >60)
Glucose, Bld: 163 mg/dL — ABNORMAL HIGH (ref 70–99)
Potassium: 3.6 mmol/L (ref 3.5–5.1)
Sodium: 140 mmol/L (ref 135–145)
Total Bilirubin: 0.6 mg/dL (ref 0.3–1.2)
Total Protein: 5.8 g/dL — ABNORMAL LOW (ref 6.5–8.1)

## 2021-03-22 LAB — GLUCOSE, CAPILLARY
Glucose-Capillary: 194 mg/dL — ABNORMAL HIGH (ref 70–99)
Glucose-Capillary: 272 mg/dL — ABNORMAL HIGH (ref 70–99)
Glucose-Capillary: 178 mg/dL — ABNORMAL HIGH (ref 70–99)
Glucose-Capillary: 196 mg/dL — ABNORMAL HIGH (ref 70–99)

## 2021-03-22 LAB — MRSA NEXT GEN BY PCR, NASAL: MRSA by PCR Next Gen: NOT DETECTED

## 2021-03-22 NOTE — TOC Initial Note (Signed)
Transition of Care [facility]) - Initial/Assessment Note    Patient Details  Name: [name] MRN: [id number] Date of Birth: August 24, 1940  Transition of Care [facility]) CM/SW Contact:    Ross Ludwig, LCSW Phone Number: 03/22/2021, 5:35 PM  Clinical Narrative:                  Patient is an 80 year old female who is alert and oriented x4.  Patient lives with her husband and plans to return after some short term rehab.  CSW spoke to patient and her husband to discuss SNF placement options.  CSW explained how insurance pays for stay, and what to expect at SNF.  Patient has not been to a facility before, patient feels pretty weak, and normally able to ambulate short distances with a walker, dress herself, use the commoded, and bath herself.  Patient husband helps assist as needed.  CSW spoke to both patient and husband, they gave CSW permission to begin bed search in [facility]  Patient is a level 2 manual screen for passar and will need insurance authorization before she is able to go to SNF.  CSW to fax out to different facilities.  Expected Discharge Plan: Skilled Nursing Facility Barriers to Discharge: Continued Medical Work up   Patient Goals and CMS Choice Patient states their goals for this hospitalization and ongoing recovery are:: To go to SNF for short term rehab, then return back home. CMS Medicare.gov Compare Post Acute Care list provided to:: Patient Represenative (must comment) Choice offered to / list presented to : Spouse  Expected Discharge Plan and Services Expected Discharge Plan: [place] Choice: [place] arrangements for the past 2 months: Single Family Home                                      Prior Living Arrangements/Services Living arrangements for the past 2 months: Single Family Home Lives with:: Spouse Patient language and need for interpreter reviewed:: Yes Do you feel safe going back  to the place where you live?: No   Patient and husband feel she needs short term rehab, before she is able to return back home.  Need for Family Participation in Patient Care: No (Comment) Care giver support system in place?: No (comment)   Criminal Activity/Legal Involvement Pertinent to Current Situation/Hospitalization: No - Comment as needed  Activities of Daily Living Home Assistive Devices/Equipment: Cane (specify quad or straight), Bedside commode/3-in-1, CBG Meter, Blood pressure cuff, Built-in shower seat ADL Screening (condition at time of admission) Patient's cognitive ability adequate to safely complete daily activities?: Yes Is the patient deaf or have difficulty hearing?: No Does the patient have difficulty seeing, even when wearing glasses/contacts?: No Does the patient have difficulty concentrating, remembering, or making decisions?: Yes Patient able to express need for assistance with ADLs?: Yes Does the patient have difficulty dressing or bathing?: Yes Independently performs ADLs?: No Communication: Independent Dressing (OT): Independent Grooming: Independent Feeding: Independent Bathing: Needs assistance Is this a change from baseline?: Pre-admission baseline Toileting: Independent In/Out Bed: Independent Walks in Home: Independent with device (comment), Needs assistance Is this a change from baseline?: Pre-admission baseline Does the patient have difficulty walking or climbing stairs?: Yes Weakness of Legs: Both Weakness of Arms/Hands: None  Permission Sought/Granted Permission sought to share information with : Case Manager, Customer service manager, Family  Supports Permission granted to share information with : Yes, Release of Information Signed, Yes, Verbal Permission Granted  Share Information with NAME: [place], [place] [phone]  [phone]  Markgraf,TODD Son [phone]  [phone]  Permission granted to share info w AGENCY: SNF  admissions        Emotional Assessment Appearance:: Appears stated age Attitude/Demeanor/Rapport: Engaged Affect (typically observed): Calm, Pleasant, Stable Orientation: : Oriented to Place, Oriented to Self, Oriented to  Time, Oriented to Situation Alcohol / Substance Use: Not Applicable Psych Involvement: No (comment)  Admission diagnosis:  Hypoxia [R09.02] AKI (acute kidney injury) ([place]) [N17.9] PNA (pneumonia) [J18.9] Pneumonia [J18.9] Patient Active Problem List   Diagnosis Date Noted   Effusion of right knee 03/22/2021   Acute respiratory failure with hypoxia ([place]) 03/22/2021   Pneumonia 03/22/2021   PNA (pneumonia) 03/21/2021   Chronic UTI 02/23/2021   Heart palpitations 02/11/2021   Ankle edema, bilateral 02/11/2021   Hoarseness 10/17/2020   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 11/22/2019   Healthcare maintenance 02/22/2019   Physical deconditioning 02/22/2019   SOB (shortness of breath) on exertion 02/22/2019   Adenomatous polyp 12/20/2018   Pain due to onychomycosis of toenails of both feet 11/08/2018   Diabetic neuropathy ([place]) 11/08/2018   History of total knee replacement, bilateral 11/14/2017   Personal history of colonic polyps 05/18/2017   Arthritis of midfoot 03/03/2016   Hammertoes of both feet 03/03/2016   Aortic atherosclerosis ([place]) 02/16/2016   Solitary pulmonary nodule 01/08/2016   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   Bronchiectasis ([place]) 10/18/2015   IBS (irritable bowel syndrome) 07/17/2014   Recurrent major depression in full remission ([place]) 01/03/2014   Posterior tibial tendon dysfunction 01/03/2014   Osteoarthritis, knee 01/03/2014   Glaucoma 01/03/2014   Hypertension associated with diabetes ([place]) 01/03/2014   Obesity (BMI 30-39.9) 06/15/2013   Primary open-angle glaucoma 08/02/2012   Foot tendinitis 07/20/2010   INSOMNIA, CHRONIC 07/25/2009   Fatty liver 03/18/2009   GERD 12/25/2007   Hyperlipidemia associated with type 2  diabetes mellitus ([place]) 07/26/2007   ANEMIA, B12 DEFICIENCY 12/29/2006   Diabetes mellitus type II, controlled ([place]) 12/28/2006   RESTLESS LEG SYNDROME, MILD 12/28/2006   NEUROPATHY, IDIOPATHIC PERIPHERAL NEC 12/28/2006   Osteoporosis 12/12/2006   BREAST CANCER, HX OF 12/12/2006   PCP:  Marin Olp, MD Pharmacy:   [place], [place] - [address] [place] [place] [address] Phone: [phone] Fax: [phone]     Social Determinants of Health (SDOH) Interventions    Readmission Risk Interventions No flowsheet data found.

## 2021-03-22 NOTE — Evaluation (Signed)
Physical Therapy Evaluation Patient Details Name: [name] MRN: [id number] DOB: [birth year] Today's Date: 03/22/2021  History of Present Illness  Pt admitted from home s/p fall with R knee pain (R knee clear on imaging) weakness and inability to mobilize at home.  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  Clinical Impression  Pt admitted as above and presenting with significant functional mobility limitations 2* generalized weakness; R knee pain with limited WB tolerance; premorbid deconditioning; obesity, and balance deficits.  Pt is currently requiring significant assist of 2 for performance of minimal basic mobility tasks and would benefit from follow up rehab at SNF level to maximize IND and safety prior to dc home with limited assist.     Recommendations for follow up therapy are one component of a multi-disciplinary discharge planning process, led by the attending physician.  Recommendations may be updated based on patient status, additional functional criteria and insurance authorization.  Follow Up Recommendations Skilled nursing-short term rehab (<3 hours/day)    Assistance Recommended at Discharge Frequent or constant Supervision/Assistance  Functional Status Assessment Patient has had a recent decline in their functional status and demonstrates the ability to make significant improvements in function in a reasonable and predictable amount of time.  Equipment Recommendations  None recommended by PT    Recommendations for Other Services       Precautions / Restrictions Precautions Precautions: Fall Restrictions Weight Bearing Restrictions: No      Mobility  Bed Mobility Overal bed mobility: Needs Assistance Bed Mobility: Supine to Sit;Sit to Supine     Supine to sit: HOB elevated;Mod assist;+2 for physical assistance Sit to  supine: Mod assist;+2 for physical assistance   General bed mobility comments: Assist to manage LEs and to control trunk    Transfers Overall transfer level: Needs assistance Equipment used: Rolling walker (2 wheels) Transfers: Sit to/from Stand Sit to Stand: Mod assist;+2 physical assistance;+2 safety/equipment;From elevated surface           General transfer comment: Use of bed to perform significant part of sit to stand but still requiring mod assist of 2 to bring wt up and fwd and to balance in initial standing.    Ambulation/Gait Ambulation/Gait assistance: Min assist;Mod assist;+2 physical assistance;+2 safety/equipment Gait Distance (Feet): 3 Feet Assistive device: Rolling walker (2 wheels) Gait Pattern/deviations: Step-to pattern;Decreased step length - right;Decreased step length - left;Shuffle       General Gait Details: Pt able to slowly shuffle up side of bed only  Stairs            Wheelchair Mobility    Modified Rankin (Stroke Patients Only)       Balance Overall balance assessment: Needs assistance Sitting-balance support: No upper extremity supported;Feet supported Sitting balance-Leahy Scale: Fair     Standing balance support: Bilateral upper extremity supported Standing balance-Leahy Scale: Poor                               Pertinent Vitals/Pain Pain Assessment: Faces Faces Pain Scale: Hurts little more Pain Location: R knee with increased flexion Pain Descriptors / Indicators: Grimacing;Sore Pain Intervention(s): Limited activity within patient's tolerance;Monitored during session    [place] expects to be discharged to:: Private residence Living Arrangements: Spouse/significant other Available Help at Discharge: Family;Available 24 hours/day  Type of Home: House Home Access: Ramped entrance       Home Layout: Able to live on main level with bedroom/bathroom Home Equipment: [place] (2  wheels);Cane - single point;BSC/3in1;Tub bench;Grab bars - toilet;Hand held shower head;Transport chair      Prior Function Prior Level of Function : Needs assist       Physical Assist : Mobility (physical)     Mobility Comments: Pt states for last several months she has basically moved from recliner to [facility] and back but ambulated that limited distance holding to furniture       Hand Dominance   Dominant Hand: Right    Extremity/Trunk Assessment   Upper Extremity Assessment Upper Extremity Assessment: Defer to OT evaluation    Lower Extremity Assessment Lower Extremity Assessment: Generalized weakness;RLE deficits/detail;LLE deficits/detail RLE Deficits / Details: 3-/5 strength hip and knee; knee flex limited to ~ 75 degrees by pain RLE Sensation: history of peripheral neuropathy LLE Sensation: history of peripheral neuropathy       Communication   Communication: No difficulties  Cognition Arousal/Alertness: Awake/alert Behavior During Therapy: WFL for tasks assessed/performed Overall Cognitive Status: Within Functional Limits for tasks assessed                                          General Comments      Exercises     Assessment/Plan    PT Assessment Patient needs continued PT services  PT Problem List Decreased strength;Decreased range of motion;Decreased activity tolerance;Decreased balance;Decreased mobility;Decreased knowledge of use of DME;Pain;Obesity       PT Treatment Interventions DME instruction;Gait training;Functional mobility training;Therapeutic activities;Therapeutic exercise;Balance training;Patient/family education    PT Goals (Current goals can be found in the Care Plan section)  Acute Rehab PT Goals Patient Stated Goal: Regain IND PT Goal Formulation: With patient Time For Goal Achievement: 04/05/21 Potential to Achieve Goals: Fair    Frequency Min 3X/week   Barriers to discharge Decreased caregiver support Spouse  states he is unable to provide level of assist pt currently needs    Co-evaluation PT/OT/SLP Co-Evaluation/Treatment: Yes Reason for Co-Treatment: For patient/therapist safety;To address functional/ADL transfers PT goals addressed during session: Mobility/safety with mobility OT goals addressed during session: ADL's and self-care       AM-PAC PT "6 Clicks" Mobility  Outcome Measure Help needed turning from your back to your side while in a flat bed without using bedrails?: A Lot Help needed moving from lying on your back to sitting on the side of a flat bed without using bedrails?: A Lot Help needed moving to and from a bed to a chair (including a wheelchair)?: A Lot Help needed standing up from a chair using your arms (e.g., wheelchair or bedside chair)?: A Lot Help needed to walk in hospital room?: Total Help needed climbing 3-5 steps with a railing? : Total 6 Click Score: 10    End of Session Equipment Utilized During Treatment: Gait belt Activity Tolerance: Patient limited by fatigue Patient left: in bed;with call bell/phone within reach;with bed alarm set Nurse Communication: Mobility status PT Visit Diagnosis: Unsteadiness on feet (R26.81);Muscle weakness (generalized) (M62.81);Difficulty in walking, not elsewhere classified (R26.2);Pain Pain - Right/Left: Right Pain - part of body: Knee    Time: 0940-1016 PT Time Calculation (min) (ACUTE ONLY): 36 min   Charges:   PT Evaluation $PT Eval Low Complexity: 1 Low  Debe Coder PT Acute Rehabilitation Services Pager [phone] Office [phone]   Tamel Abel 03/22/2021, 1:04 PM

## 2021-03-22 NOTE — Progress Notes (Addendum)
PROGRESS NOTE    [name]  YQM:[id number] DOB: 1940/12/22 DOA: 03/21/2021 PCP: Marin Olp, MD   Brief Narrative: [name] is a 80 y.o. female with a history of hypertension, GERD, depression. Patient presented secondary to right knee pain and was found to have evidence of pneumonia with associated hypoxia. Started empirically on Ceftriaxone and azithromycin. Right knee with moderate-large effusion; no fracture on x-ray imaging.   Assessment & Plan:   Acute respiratory failure with hypoxia (HCC) Secondary to pneumonia. Hypoxia down to 88%. -Wean to room air as able -Ambulatory pulse ox  Effusion of right knee Likely reactive and secondary to recent fall. Likely contributing to pain with ambulation and complicating her ability to ambulate safely. Lives at home with her husband. -Orthopedic surgery consult for bedside aspiration -PT/OT recommendations for mobility/safety  PNA (pneumonia) Started empirically on Ceftriaxone and azithromycin. Leukocytosis resolved. No blood cultures obtained on admission -Continue Ceftriaxone/azithromycin  DVT prophylaxis: Lovenox Code Status:   Code Status: Full Code Family Communication: None at bedside Disposition Plan: Discharge home vs SNF likely in 1-2 days pending PT eval/orthopedic surgery management of right knee effusion   Consultants:  Orthopedic surgery ([facility])  Procedures:  None  Antimicrobials: Ceftriaxone Azithromycin    Subjective: Some right knee pain but that has improved a little. Feels worse today as it relates to her energy.  Objective: Vitals:   03/21/21 2137 03/21/21 2344 03/22/21 0334 03/22/21 1000  BP:  (!) 141/53 (!) 134/58 (!) 125/47  Pulse:  84 74 75  Resp:  16 18 16   Temp:      TempSrc:      SpO2: 94% 95% 95% 94%  Weight:      Height:        Intake/Output Summary (Last 24 hours) at 03/22/2021 1305 Last data filed at 03/22/2021 1000 Gross per 24 hour  Intake 2462.95 ml   Output 2350 ml  Net 112.95 ml   Filed Weights   03/21/21 0923  Weight: 122.3 kg    Examination:  General exam: Appears calm and comfortable Respiratory system: Clear to auscultation. Respiratory effort normal. Cardiovascular system: S1 & S2 heard, RRR. No murmurs, rubs, gallops or clicks. Gastrointestinal system: Abdomen is nondistended, soft and nontender. No organomegaly or masses felt. Normal bowel sounds heard. Central nervous system: Alert and oriented. No focal neurological deficits. Musculoskeletal: No edema. No calf tenderness. Right knee is slightly warm with no erythema and moderate-large knee effusion Skin: No cyanosis. No rashes Psychiatry: Judgement and insight appear normal. Mood & affect appropriate.     Data Reviewed: I have personally reviewed following labs and imaging studies  CBC Lab Results  Component Value Date   WBC 8.4 03/22/2021   RBC 3.57 (L) 03/22/2021   HGB 10.6 (L) 03/22/2021   HCT 33.9 (L) 03/22/2021   MCV 95.0 03/22/2021   MCH 29.7 03/22/2021   PLT 211 03/22/2021   MCHC 31.3 03/22/2021   RDW 14.5 03/22/2021   LYMPHSABS 1.3 12/18/2020   MONOABS 0.7 12/18/2020   EOSABS 0.3 12/18/2020   BASOSABS 0.0 52/84/1324     Last metabolic panel Lab Results  Component Value Date   NA 140 03/22/2021   K 3.6 03/22/2021   CL 102 03/22/2021   CO2 34 (H) 03/22/2021   BUN 16 03/22/2021   CREATININE 0.74 03/22/2021   GLUCOSE 163 (H) 03/22/2021   GFRNONAA >60 03/22/2021   GFRAA 96 10/29/2020   CALCIUM 8.2 (L) 03/22/2021   PROT 5.8 (L)  03/22/2021   ALBUMIN 2.9 (L) 03/22/2021   BILITOT 0.6 03/22/2021   ALKPHOS 68 03/22/2021   AST 13 (L) 03/22/2021   ALT 12 03/22/2021   ANIONGAP 4 (L) 03/22/2021    CBG (last 3)  Recent Labs    03/21/21 2004 03/22/21 0813 03/22/21 1139  GLUCAP 256* 194* 272*     GFR: Estimated Creatinine Clearance: 80.8 mL/min (by C-G formula based on SCr of 0.74 mg/dL).  Coagulation Profile: No results for input(s):  INR, PROTIME in the last 168 hours.  Recent Results (from the past 240 hour(s))  Resp Panel by RT-PCR (Flu A&B, Covid) Nasopharyngeal Swab     Status: None   Collection Time: 03/21/21  9:38 AM   Specimen: Nasopharyngeal Swab; Nasopharyngeal(NP) swabs in vial transport medium  Result Value Ref Range Status   SARS Coronavirus 2 by RT PCR NEGATIVE NEGATIVE Final    Comment: (NOTE) SARS-CoV-2 target nucleic acids are NOT DETECTED.  The SARS-CoV-2 RNA is generally detectable in upper respiratory specimens during the acute phase of infection. The lowest concentration of SARS-CoV-2 viral copies this assay can detect is 138 copies/mL. A negative result does not preclude SARS-Cov-2 infection and should not be used as the sole basis for treatment or other patient management decisions. A negative result may occur with  improper specimen collection/handling, submission of specimen other than nasopharyngeal swab, presence of viral mutation(s) within the areas targeted by this assay, and inadequate number of viral copies(<138 copies/mL). A negative result must be combined with clinical observations, patient history, and epidemiological information. The expected result is Negative.  Fact Sheet for Patients:  EntrepreneurPulse.com.au  Fact Sheet for Healthcare Providers:  IncredibleEmployment.be  This test is no t yet approved or cleared by the [place] FDA and  has been authorized for detection and/or diagnosis of SARS-CoV-2 by FDA under an Emergency Use Authorization (EUA). This EUA will remain  in effect (meaning this test can be used) for the duration of the COVID-19 declaration under Section 564(b)(1) of the Act, 21 U.S.C.section 360bbb-3(b)(1), unless the authorization is terminated  or revoked sooner.       Influenza A by PCR NEGATIVE NEGATIVE Final   Influenza B by PCR NEGATIVE NEGATIVE Final    Comment: (NOTE) The Xpert Xpress  SARS-CoV-2/FLU/RSV plus assay is intended as an aid in the diagnosis of influenza from Nasopharyngeal swab specimens and should not be used as a sole basis for treatment. Nasal washings and aspirates are unacceptable for Xpert Xpress SARS-CoV-2/FLU/RSV testing.  Fact Sheet for Patients: EntrepreneurPulse.com.au  Fact Sheet for Healthcare Providers: IncredibleEmployment.be  This test is not yet approved or cleared by the [place] FDA and has been authorized for detection and/or diagnosis of SARS-CoV-2 by FDA under an Emergency Use Authorization (EUA). This EUA will remain in effect (meaning this test can be used) for the duration of the COVID-19 declaration under Section 564(b)(1) of the Act, 21 U.S.C. section 360bbb-3(b)(1), unless the authorization is terminated or revoked.  Performed at [facility], [name] [place], [place] [address]   MRSA Next Gen by PCR, Nasal     Status: None   Collection Time: 03/21/21  9:21 PM   Specimen: Nasal Mucosa; Nasal Swab  Result Value Ref Range Status   MRSA by PCR Next Gen NOT DETECTED NOT DETECTED Final    Comment: (NOTE) The GeneXpert MRSA Assay (FDA approved for NASAL specimens only), is one component of a comprehensive MRSA colonization surveillance program. It is not intended to  diagnose MRSA infection nor to guide or monitor treatment for MRSA infections. Test performance is not FDA approved in patients less than 73 years old. Performed at [facility], [place] [address] [place], [place] [address]         Radiology Studies: CT Angio Chest PE W and/or Wo Contrast  Result Date: 03/21/2021 CLINICAL DATA:  Shortness of breath, hypoxia EXAM: CT ANGIOGRAPHY CHEST WITH CONTRAST TECHNIQUE: Multidetector CT imaging of the chest was performed using the standard protocol during bolus administration of intravenous contrast. Multiplanar CT image  reconstructions and MIPs were obtained to evaluate the vascular anatomy. CONTRAST:  71mL OMNIPAQUE IOHEXOL 350 MG/ML SOLN COMPARISON:  01/26/2017 FINDINGS: Cardiovascular: Scattered coronary artery calcifications are seen. There is homogeneous enhancement in thoracic aorta. Are no intraluminal filling defects in the central pulmonary artery branches. Evaluation of peripheral branches is limited by motion artifacts. Mediastinum/Nodes: No significant lymphadenopathy seen. There is inhomogeneous attenuation in the thyroid with possible low-density nodules. Lungs/Pleura: Small patchy infiltrates are seen in the posterior lower lung fields. There is no focal pulmonary consolidation. There is no significant pleural effusion or pneumothorax. Upper Abdomen: There are low-density foci in the liver which are not fully evaluated. This finding was not seen in the previous CT done on 01/26/2017. Surgical clips are seen in gallbladder fossa. Musculoskeletal: Unremarkable. Review of the MIP images confirms the above findings. IMPRESSION: There is no evidence of central pulmonary artery embolism. Evaluation of small peripheral branches is limited by motion artifacts, especially in the lower lung fields. There are new small patchy infiltrates in the lower lung fields suggesting subsegmental atelectasis/pneumonitis. There is no pleural effusion or pneumothorax. There are scattered coronary artery calcifications. There are multiple low-density foci of varying sizes in the liver. This may suggest scarring from previous intervention or inflammatory or neoplastic process. Follow-up multiphasic CT when the patient's clinical condition permits should be considered. Electronically Signed   By: Elmer Picker M.D.   On: 03/21/2021 12:03   US RENAL  Result Date: 03/21/2021 CLINICAL DATA:  Acute renal insufficiency EXAM: RENAL / URINARY TRACT ULTRASOUND COMPLETE COMPARISON:  None. FINDINGS: Right Kidney: Renal measurements: 12.1 x  6.0 x 6.5 cm = volume: 246 mL. Imaging is slightly limited by the depth of the structure as well as overlying bowel gas which obscures the upper and lower poles of the right kidney partially. Echogenicity within normal limits. No mass or hydronephrosis visualized. Left Kidney: Renal measurements: 12.1 x 7.3 x 5.8 cm = volume: 270 mL. Similarly, imaging of the left kidney is slightly limited by the depth of the structure and overlying bowel gas which slightly limits evaluation of the upper and lower pole. Echogenicity within normal limits. No mass or hydronephrosis visualized. Bladder: Decompressed with a Foley catheter balloon seen within its lumen. Other: None. IMPRESSION: Slightly limited but unremarkable renal sonogram. Electronically Signed   By: Fidela Salisbury M.D.   On: 03/21/2021 19:10   DG Chest Port 1 View  Result Date: 03/21/2021 CLINICAL DATA:  Pain after fall.  History of hip replacement EXAM: PORTABLE CHEST 1 VIEW COMPARISON:  04/11/2018 FINDINGS: Normal heart size and mediastinal contours. Mild streaky density at the left base. No edema, effusion, or pneumothorax. Normal heart size. Mild aortic tortuosity. No effusion or pneumothorax. No acute osseous findings. IMPRESSION: Mild atelectasis. Electronically Signed   By: Jorje Guild M.D.   On: 03/21/2021 09:54   DG Knee Complete 4 Views Right  Result Date: 03/21/2021 CLINICAL DATA:  pain after  fall, h/o knee replacement EXAM: RIGHT KNEE - COMPLETE 4+ VIEW COMPARISON:  Radiograph 12/05/2016 FINDINGS: There is a 3 component total knee arthroplasty in normal alignment without evidence of loosening or periprosthetic fracture. Trace joint fluid. IMPRESSION: No evidence of right knee arthroplasty complication. No acute osseous abnormality. Electronically Signed   By: Maurine Simmering M.D.   On: 03/21/2021 09:56        Scheduled Meds:  atenolol  25 mg Oral Daily   Chlorhexidine Gluconate Cloth  6 each Topical Daily   colesevelam  625 mg Oral  TID   dicyclomine  20 mg Oral TID AC & HS   dorzolamide-timolol  1 drop Both Eyes BID   doxycycline  100 mg Oral Q12H   enoxaparin (LOVENOX) injection  60 mg Subcutaneous Q24H   gabapentin  200 mg Oral BID   guaiFENesin  600 mg Oral BID   insulin aspart  0-15 Units Subcutaneous TID WC   insulin aspart  0-5 Units Subcutaneous QHS   latanoprost  1 drop Right Eye QHS   loratadine  10 mg Oral Daily   mometasone-formoterol  2 puff Inhalation BID   Netarsudil-Latanoprost  1 drop Left Eye QHS   pantoprazole  40 mg Oral Daily   pramipexole  0.5 mg Oral QHS   pravastatin  10 mg Oral q1800   temazepam  15 mg Oral QHS   venlafaxine XR  75 mg Oral Q breakfast   vitamin B-12  1,000 mcg Oral Daily   Continuous Infusions:  cefTRIAXone (ROCEPHIN)  IV 2 g (03/21/21 1857)     LOS: 0 days     Cordelia Poche, MD [facility] 03/22/2021, 1:05 PM  If 7PM-7AM, please contact night-coverage [email or web address]

## 2021-03-22 NOTE — NC FL2 (Signed)
[place] LEVEL OF CARE SCREENING TOOL     IDENTIFICATION  Patient Name: [name] Birthdate: 10/26/1940 Sex: female Admission Date (Current Location): 03/21/2021  [facility] and [place] Number:  Herbalist and Address:  [facility],  [place], [place]      Provider Number: [id number]  Attending Physician Name and Address:  Mariel Aloe, MD  Relative Name and Phone Number:  Annalicia, Ren[place] [facility] [phone]  [phone]  [place],TODD Son [phone]  [phone]    Current Level of Care: Hospital Recommended Level of Care: [place] Prior Approval Number:    Date Approved/Denied:   PASRR Number: Pending  Discharge Plan: SNF    Current Diagnoses: Patient Active Problem List   Diagnosis Date Noted   Effusion of right knee 03/22/2021   Acute respiratory failure with hypoxia ([place]) 03/22/2021   Pneumonia 03/22/2021   PNA (pneumonia) 03/21/2021   Chronic UTI 02/23/2021   Heart palpitations 02/11/2021   Ankle edema, bilateral 02/11/2021   Hoarseness 10/17/2020   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 11/22/2019   Healthcare maintenance 02/22/2019   Physical deconditioning 02/22/2019   SOB (shortness of breath) on exertion 02/22/2019   Adenomatous polyp 12/20/2018   Pain due to onychomycosis of toenails of both feet 11/08/2018   Diabetic neuropathy ([place]) 11/08/2018   History of total knee replacement, bilateral 11/14/2017   Personal history of colonic polyps 05/18/2017   Arthritis of midfoot 03/03/2016   Hammertoes of both feet 03/03/2016   Aortic atherosclerosis ([place]) 02/16/2016   Solitary pulmonary nodule 01/08/2016   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   Bronchiectasis ([place]) 10/18/2015   IBS (irritable bowel syndrome) 07/17/2014   Recurrent major depression in full remission ([place]) 01/03/2014   Posterior tibial tendon dysfunction 01/03/2014   Osteoarthritis, knee 01/03/2014    Glaucoma 01/03/2014   Hypertension associated with diabetes ([place]) 01/03/2014   Obesity (BMI 30-39.9) 06/15/2013   Primary open-angle glaucoma 08/02/2012   Foot tendinitis 07/20/2010   INSOMNIA, CHRONIC 07/25/2009   Fatty liver 03/18/2009   GERD 12/25/2007   Hyperlipidemia associated with type 2 diabetes mellitus ([place]) 07/26/2007   ANEMIA, B12 DEFICIENCY 12/29/2006   Diabetes mellitus type II, controlled ([place]) 12/28/2006   RESTLESS LEG SYNDROME, MILD 12/28/2006   NEUROPATHY, IDIOPATHIC PERIPHERAL NEC 12/28/2006   Osteoporosis 12/12/2006   BREAST CANCER, HX OF 12/12/2006    Orientation RESPIRATION BLADDER Height & Weight     Self, Time, Place, Situation  O2 (1L) Incontinent Weight: 269 lb 9.6 oz (122.3 kg) Height:  6' (182.9 cm)  BEHAVIORAL SYMPTOMS/MOOD NEUROLOGICAL BOWEL NUTRITION STATUS      Incontinent Diet (Carb Modified)  AMBULATORY STATUS COMMUNICATION OF NEEDS Skin   Limited Assist Verbally Surgical wounds                       Personal Care Assistance Level of Assistance  Bathing, Feeding, Dressing Bathing Assistance: Limited assistance Feeding assistance: Independent Dressing Assistance: Limited assistance     Functional Limitations Info  Sight, Hearing, Speech Sight Info: Adequate Hearing Info: Adequate Speech Info: Adequate    SPECIAL CARE FACTORS FREQUENCY  PT (By licensed PT), OT (By licensed OT)     PT Frequency: Minimum 5x a week OT Frequency: Minimum 5x a week            Contractures Contractures Info: Not present    Additional Factors Info  Code Status, Allergies Code Status Info: Full Code Allergies Info:  Nitrofurantoin   Levaquin (Levofloxacin In D5w)   Brimonidine   Cephalexin   Erythromycin Ethylsuccinate   Oxycodone           Current Medications (03/22/2021):  This is the current hospital active medication list Current Facility-Administered Medications  Medication Dose Route Frequency Provider Last Rate Last Admin    acetaminophen (TYLENOL) tablet 650 mg  650 mg Oral Q6H PRN Marylyn Ishihara, Tyrone A, DO       Or   acetaminophen (TYLENOL) suppository 650 mg  650 mg Rectal Q6H PRN Marylyn Ishihara, Tyrone A, DO       albuterol (VENTOLIN HFA) 108 (90 Base) MCG/ACT inhaler 2 puff  2 puff Inhalation Q4H PRN Marylyn Ishihara, Tyrone A, DO       atenolol (TENORMIN) tablet 25 mg  25 mg Oral Daily Kyle, Tyrone A, DO   25 mg at 03/22/21 0815   cefTRIAXone (ROCEPHIN) 2 g in sodium chloride 0.9 % 100 mL IVPB  2 g Intravenous q1800 Kyle, Tyrone A, DO 200 mL/hr at 03/21/21 1857 2 g at 03/21/21 1857   Chlorhexidine Gluconate Cloth 2 % PADS 6 each  6 each Topical Daily Marylyn Ishihara, Tyrone A, DO       colesevelam [facility]) tablet 625 mg  625 mg Oral TID Marylyn Ishihara, Tyrone A, DO   625 mg at 03/22/21 0817   dicyclomine (BENTYL) tablet 20 mg  20 mg Oral TID AC & HS Kyle, Tyrone A, DO   20 mg at 03/22/21 1147   dorzolamide-timolol (COSOPT) 22.3-6.8 MG/ML ophthalmic solution 1 drop  1 drop Both Eyes BID Marylyn Ishihara, Tyrone A, DO   1 drop at 03/22/21 0823   doxycycline (VIBRA-TABS) tablet 100 mg  100 mg Oral Q12H Kyle, Tyrone A, DO   100 mg at 03/22/21 0817   enoxaparin (LOVENOX) injection 60 mg  60 mg Subcutaneous Q24H Kyle, Tyrone A, DO   60 mg at 03/21/21 2154   gabapentin (NEURONTIN) capsule 200 mg  200 mg Oral BID Marylyn Ishihara, Tyrone A, DO   200 mg at 03/22/21 0815   guaiFENesin (MUCINEX) 12 hr tablet 600 mg  600 mg Oral BID Marylyn Ishihara, Tyrone A, DO   600 mg at 03/22/21 0818   HYDROcodone-acetaminophen (NORCO/VICODIN) 5-325 MG per tablet 1-2 tablet  1-2 tablet Oral Q6H PRN Marylyn Ishihara, Tyrone A, DO       insulin aspart (novoLOG) injection 0-15 Units  0-15 Units Subcutaneous TID WC Kyle, Tyrone A, DO   8 Units at 03/22/21 1148   insulin aspart (novoLOG) injection 0-5 Units  0-5 Units Subcutaneous QHS Kyle, Tyrone A, DO   3 Units at 03/21/21 2206   latanoprost (XALATAN) 0.005 % ophthalmic solution 1 drop  1 drop Right Eye QHS Kyle, Tyrone A, DO   1 drop at 03/21/21 2205   loratadine (CLARITIN) tablet 10  mg  10 mg Oral Daily Kyle, Tyrone A, DO   10 mg at 03/22/21 0816   mometasone-formoterol (DULERA) 100-5 MCG/ACT inhaler 2 puff  2 puff Inhalation BID Marylyn Ishihara, Tyrone A, DO   2 puff at 03/22/21 0819   Netarsudil-Latanoprost 0.02-0.005 % SOLN 1 drop  1 drop Left Eye QHS Kyle, Tyrone A, DO       pantoprazole (PROTONIX) EC tablet 40 mg  40 mg Oral Daily Kyle, Tyrone A, DO       pramipexole (MIRAPEX) tablet 0.5 mg  0.5 mg Oral QHS Kyle, Tyrone A, DO   0.5 mg at 03/21/21 2202   pravastatin (PRAVACHOL) tablet 10 mg  10  mg Oral q1800 Marylyn Ishihara, Tyrone A, DO       temazepam (RESTORIL) capsule 15 mg  15 mg Oral QHS Kyle, Tyrone A, DO   15 mg at 03/21/21 2203   venlafaxine XR (EFFEXOR-XR) 24 hr capsule 75 mg  75 mg Oral Q breakfast Marylyn Ishihara, Tyrone A, DO   75 mg at 03/22/21 0818   vitamin B-12 (CYANOCOBALAMIN) tablet 1,000 mcg  1,000 mcg Oral Daily Marylyn Ishihara, Tyrone A, DO   1,000 mcg at 03/22/21 6922     Discharge Medications: Please see discharge summary for a list of discharge medications.  Relevant Imaging Results:  Relevant Lab Results:   Additional Information SSN [id number]  Ross Ludwig, LCSW

## 2021-03-22 NOTE — Hospital Course (Signed)
[name] is a 80 y.o. female with a history of hypertension, GERD, depression. Patient presented secondary to right knee pain and was found to have evidence of pneumonia with associated hypoxia. Started empirically on Ceftriaxone and azithromycin. Right knee with moderate-large effusion; no fracture on x-ray imaging.

## 2021-03-22 NOTE — Plan of Care (Signed)
  Problem: Education: Goal: Knowledge of General Education information will improve Description: Including pain rating scale, medication(s)/side effects and non-pharmacologic comfort measures Outcome: Progressing   Problem: Health Behavior/Discharge Planning: Goal: Ability to manage health-related needs will improve Outcome: Progressing   Problem: Clinical Measurements: Goal: Respiratory complications will improve Outcome: Progressing   

## 2021-03-22 NOTE — Evaluation (Signed)
Occupational Therapy Evaluation Patient Details Name: [name] MRN: [id number] DOB: [birth year] Today's Date: 03/22/2021   History of Present Illness Pt admitted from home s/p fall with R knee pain (R knee clear on imaging) weakness and inability to mobilize at home.  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   Clinical Impression   Patient is a pleasant 80 year old female who was admitted for above. Patient was noted to need +2 for functional mobility and transfer attempt on this date with dizziness noted with standing. Patient was noted to have had a decline in the ability to participate in LB dressing, functional mobility transfers and ADLs at [facility]. Patient would continue to benefit from skilled OT services at this time while admitted and after d/c to address noted deficits in order to improve overall safety and independence in ADLs.       Recommendations for follow up therapy are one component of a multi-disciplinary discharge planning process, led by the attending physician.  Recommendations may be updated based on patient status, additional functional criteria and insurance authorization.   Follow Up Recommendations  Skilled nursing-short term rehab (<3 hours/day)    Assistance Recommended at Discharge Frequent or constant Supervision/Assistance  Functional Status Assessment  Patient has had a recent decline in their functional status and demonstrates the ability to make significant improvements in function in a reasonable and predictable amount of time.  Equipment Recommendations  Other (comment) (patient has all needed at home)    Recommendations for Other Services       Precautions / Restrictions Precautions Precautions: Fall Restrictions Weight Bearing Restrictions: No      Mobility Bed Mobility Overal bed mobility: Needs  Assistance Bed Mobility: Supine to Sit;Sit to Supine     Supine to sit: HOB elevated;Mod assist;+2 for physical assistance Sit to supine: Mod assist;+2 for physical assistance   General bed mobility comments: patient needed physical asssit for LEs and to manage posterior leaning trunk control    Transfers Overall transfer level: Needs assistance Equipment used: Rolling walker (2 wheels) Transfers: Sit to/from Stand Sit to Stand: Mod assist;+2 physical assistance;+2 safety/equipment;From elevated surface           General transfer comment: Use of bed to perform significant part of sit to stand but still requiring mod assist of 2 to bring wt up and fwd and to balance in initial standing.      Balance Overall balance assessment: Needs assistance Sitting-balance support: No upper extremity supported;Feet supported Sitting balance-Leahy Scale: Fair Sitting balance - Comments: after multiple posterior LOB intially   Standing balance support: Bilateral upper extremity supported;Reliant on assistive device for balance Standing balance-Leahy Scale: Poor                             ADL either performed or assessed with clinical judgement   ADL Overall ADL's : Needs assistance/impaired Eating/Feeding: Set up;Sitting   Grooming: Wash/dry hands;Wash/dry face;Sitting Grooming Details (indicate cue type and reason): EOB with posterior leaning noted able to correct it Upper Body Bathing: Min guard;Sitting   Lower Body Bathing: Moderate assistance;Sit to/from stand   Upper Body Dressing : Set up;Sitting   Lower Body Dressing: Sit to/from stand;Moderate assistance Lower Body Dressing Details (indicate cue type and reason): patient had hard time lifting RLE  up to don sneaker into 4 position like she normally does. Toilet Transfer: Total assistance;+2 for safety/equipment;+2 for physical assistance Toilet Transfer Details (indicate cue type and reason): dizziness with  attempts to stand. patient was able to take few small steps to end of the bed. Toileting- Water quality scientist and Hygiene: Sit to/from stand;Total assistance       Functional mobility during ADLs: +2 for safety/equipment;+2 for physical assistance;Total assistance       Vision Baseline Vision/History: 1 Wears glasses Patient Visual Report: No change from baseline       Perception     Praxis      Pertinent Vitals/Pain       Hand Dominance Right   Extremity/Trunk Assessment Upper Extremity Assessment Upper Extremity Assessment: RUE deficits/detail;LUE deficits/detail RUE Deficits / Details: patients ROM WNL 4/5 strength in UE LUE Deficits / Details: patients ROM WNL 4/5 stregnth           Communication Communication Communication: No difficulties   Cognition Arousal/Alertness: Awake/alert Behavior During Therapy: WFL for tasks assessed/performed Overall Cognitive Status: Within Functional Limits for tasks assessed                                       General Comments       Exercises     Shoulder Instructions      Home Living Family/patient expects to be discharged to:: Private residence Living Arrangements: Spouse/significant other Available Help at Discharge: Family;Available 24 hours/day Type of Home: House Home Access: Ramped entrance     Home Layout: Able to live on main level with bedroom/bathroom     Bathroom Shower/Tub: Tub/shower unit   Bathroom Toilet: Handicapped height Bathroom Accessibility: Yes   Home Equipment: Conservation officer, nature (2 wheels);Cane - single point;BSC/3in1;Tub bench;Grab bars - toilet;Hand held shower head;Transport chair          Prior Functioning/Environment Prior Level of Function : Needs assist       Physical Assist : Mobility (physical);ADLs (physical)     Mobility Comments: Pt states for last several months she has basically moved from recliner to [facility] and back but ambulated that limited  distance holding to furniture ADLs Comments: patient reported being able to complete bathing and dressing tasks. and toileting to [facility] herself at home        OT Problem List: Decreased strength;Decreased range of motion;Impaired balance (sitting and/or standing);Decreased activity tolerance;Decreased safety awareness;Decreased knowledge of precautions;Decreased knowledge of use of DME or AE;Pain      OT Treatment/Interventions: Self-care/ADL training;Therapeutic exercise;Neuromuscular education;DME and/or AE instruction;Energy conservation;Therapeutic activities;Patient/family education;Balance training    OT Goals(Current goals can be found in the care plan section) Acute Rehab OT Goals Patient Stated Goal: to get back home with husband OT Goal Formulation: With patient Time For Goal Achievement: 04/05/21 Potential to Achieve Goals: Good  OT Frequency: Min 2X/week   Barriers to D/C:            Co-evaluation PT/OT/SLP Co-Evaluation/Treatment: Yes Reason for Co-Treatment: For patient/therapist safety;To address functional/ADL transfers PT goals addressed during session: Mobility/safety with mobility OT goals addressed during session: ADL's and self-care      AM-PAC OT "6 Clicks" Daily Activity     Outcome Measure Help from another person eating meals?: A Little Help from another person taking care of personal grooming?: A Little Help from another person toileting, which includes using toliet, bedpan, or urinal?: A Lot Help  from another person bathing (including washing, rinsing, drying)?: A Lot Help from another person to put on and taking off regular upper body clothing?: A Little Help from another person to put on and taking off regular lower body clothing?: A Lot 6 Click Score: 15   End of Session Equipment Utilized During Treatment: Gait belt;Rolling walker (2 wheels) Nurse Communication: Mobility status  Activity Tolerance: Patient limited by lethargy Patient left: in  bed;with call bell/phone within reach;with bed alarm set;with family/visitor present  OT Visit Diagnosis: Unsteadiness on feet (R26.81);Muscle weakness (generalized) (M62.81)                Time: 4098-1191 OT Time Calculation (min): 26 min Charges:  OT General Charges $OT Visit: 1 Visit OT Evaluation $OT Eval Low Complexity: 1 Low  Jackelyn Poling OTR/L, MS Acute Rehabilitation Department Office# [phone] Pager# [phone]   [place] 03/22/2021, 4:26 PM

## 2021-03-22 NOTE — Assessment & Plan Note (Signed)
Started empirically on Ceftriaxone and azithromycin. Leukocytosis resolved. No blood cultures obtained on admission -Continue Ceftriaxone/azithromycin

## 2021-03-22 NOTE — Assessment & Plan Note (Signed)
Secondary to pneumonia. Hypoxia down to 88%. -Wean to room air as able -Ambulatory pulse ox

## 2021-03-22 NOTE — Assessment & Plan Note (Addendum)
Likely reactive and secondary to recent fall. Likely contributing to pain with ambulation and complicating her ability to ambulate safely. Lives at home with her husband. PT/OT recommending SNF. Orthopedic surgery evaluated and aspirated 50 cc of bloody fluid. MRI obtained and was significant for no evidence of occult fracture -Orthopedic surgery recommendations: pending today

## 2021-03-23 ENCOUNTER — Inpatient Hospital Stay (HOSPITAL_COMMUNITY): Payer: Medicare PPO

## 2021-03-23 LAB — GLUCOSE, CAPILLARY
Glucose-Capillary: 186 mg/dL — ABNORMAL HIGH (ref 70–99)
Glucose-Capillary: 226 mg/dL — ABNORMAL HIGH (ref 70–99)
Glucose-Capillary: 193 mg/dL — ABNORMAL HIGH (ref 70–99)
Glucose-Capillary: 241 mg/dL — ABNORMAL HIGH (ref 70–99)

## 2021-03-23 MED ORDER — LIDOCAINE HCL (PF) 1 % IJ SOLN
30.0000 mL | Freq: Once | INTRAMUSCULAR | Status: AC
  Administered 2021-03-23: 30 mL via INTRADERMAL
  Filled 2021-03-23: qty 30

## 2021-03-23 MED ORDER — CALCIUM POLYCARBOPHIL 625 MG PO TABS
625.0000 mg | ORAL_TABLET | Freq: Every day | ORAL | Status: DC
  Administered 2021-03-23 – 2021-03-26 (×4): 625 mg via ORAL
  Filled 2021-03-23 (×4): qty 1

## 2021-03-23 NOTE — Progress Notes (Addendum)
Patient ID: [name], female   DOB: 1940/09/28, 80 y.o.   MRN: [id number]   PROCEDURE NOTE:  RIGHT knee aspiration  Under betadine prep laterally I first infiltrated the subcutaneous tissue with 10 cc of 1% Lidocaine.  After several minutes I performed an aspiration removing 50 cc of hemarthrosis (uncertain if any fatty component to the aspirate)  The lateral aspect of her knee was cleaned and dressed with a  Band-Aid

## 2021-03-23 NOTE — TOC Progression Note (Addendum)
Transition of Care [facility]) - Progression Note   Patient Details  Name: [name] MRN: [id number] Date of Birth: 1941/02/07  Transition of Care [facility]) CM/SW [place], LCSW Phone Number: 03/23/2021, 1:13 PM  Clinical Narrative: Per request, signed FL2 and 30 day note have been uploaded to [facility]. TOC awaiting bed offers and PASRR number.  Expected Discharge Plan: [place] Barriers to Discharge: Continued Medical Work up  Expected Discharge Plan and Services Expected Discharge Plan: [place] Choice: [place] arrangements for the past 2 months: Single Family Home  Readmission Risk Interventions No flowsheet data found.

## 2021-03-23 NOTE — Progress Notes (Signed)
PROGRESS NOTE    [name]  IRS:[id number] DOB: May 14, 1940 DOA: 03/21/2021 PCP: Marin Olp, MD   Brief Narrative: [name] is a 80 y.o. female with a history of hypertension, GERD, depression. Patient presented secondary to right knee pain and was found to have evidence of pneumonia with associated hypoxia. Started empirically on Ceftriaxone and azithromycin. Right knee with moderate-large effusion; no fracture on x-ray imaging.   Assessment & Plan:   Acute respiratory failure with hypoxia (HCC) Secondary to pneumonia. Hypoxia down to 88%. -Wean to room air as able -Ambulatory pulse ox  Effusion of right knee Likely reactive and secondary to recent fall. Likely contributing to pain with ambulation and complicating her ability to ambulate safely. Lives at home with her husband. PT/OT recommending SNF -Orthopedic surgery consulted again 11/14 for consideration of bedside aspiration  PNA (pneumonia) Started empirically on Ceftriaxone and azithromycin. Leukocytosis resolved. No blood cultures obtained on admission -Continue Ceftriaxone/azithromycin  DVT prophylaxis: Lovenox Code Status:   Code Status: Full Code Family Communication: None at bedside Disposition Plan: Discharge SNF likely in 1 day pending SNF bed   Consultants:  Orthopedic surgery ([facility])  Procedures:  None  Antimicrobials: Ceftriaxone Azithromycin    Subjective: Right knee feels a little better today.  Objective: Vitals:   03/22/21 2059 03/22/21 2113 03/23/21 0519 03/23/21 0931  BP:  (!) 152/60 (!) 138/56   Pulse:  76 77   Resp:  18 18   Temp:  98 F (36.7 C) 98.4 F (36.9 C)   TempSrc:  Oral Oral   SpO2: 94% 96% 95% 93%  Weight:      Height:        Intake/Output Summary (Last 24 hours) at 03/23/2021 1016 Last data filed at 03/23/2021 0519 Gross per 24 hour  Intake 146.81 ml  Output 1450 ml  Net -1303.19 ml    Filed Weights   03/21/21 0923  Weight: 122.3  kg    Examination:  General exam: Appears calm and comfortable Respiratory system: Clear to auscultation. Respiratory effort normal. Cardiovascular system: S1 & S2 heard, RRR. No murmurs, rubs, gallops or clicks. Gastrointestinal system: Abdomen is nondistended, soft and nontender. No organomegaly or masses felt. Normal bowel sounds heard. Central nervous system: Alert and oriented. No focal neurological deficits. Musculoskeletal: Right knee with mild-moderate effusion and no warmth. No calf tenderness Skin: No cyanosis. No rashes Psychiatry: Judgement and insight appear normal. Mood & affect appropriate.     Data Reviewed: I have personally reviewed following labs and imaging studies  CBC Lab Results  Component Value Date   WBC 8.4 03/22/2021   RBC 3.57 (L) 03/22/2021   HGB 10.6 (L) 03/22/2021   HCT 33.9 (L) 03/22/2021   MCV 95.0 03/22/2021   MCH 29.7 03/22/2021   PLT 211 03/22/2021   MCHC 31.3 03/22/2021   RDW 14.5 03/22/2021   LYMPHSABS 1.3 12/18/2020   MONOABS 0.7 12/18/2020   EOSABS 0.3 12/18/2020   BASOSABS 0.0 00/93/8182     Last metabolic panel Lab Results  Component Value Date   NA 140 03/22/2021   K 3.6 03/22/2021   CL 102 03/22/2021   CO2 34 (H) 03/22/2021   BUN 16 03/22/2021   CREATININE 0.74 03/22/2021   GLUCOSE 163 (H) 03/22/2021   GFRNONAA >60 03/22/2021   GFRAA 96 10/29/2020   CALCIUM 8.2 (L) 03/22/2021   PROT 5.8 (L) 03/22/2021   ALBUMIN 2.9 (L) 03/22/2021   BILITOT 0.6 03/22/2021   ALKPHOS 68 03/22/2021  AST 13 (L) 03/22/2021   ALT 12 03/22/2021   ANIONGAP 4 (L) 03/22/2021    CBG (last 3)  Recent Labs    03/22/21 1658 03/22/21 2114 03/23/21 0736  GLUCAP 178* 196* 186*      GFR: Estimated Creatinine Clearance: 80.8 mL/min (by C-G formula based on SCr of 0.74 mg/dL).  Coagulation Profile: No results for input(s): INR, PROTIME in the last 168 hours.  Recent Results (from the past 240 hour(s))  Resp Panel by RT-PCR (Flu A&B,  Covid) Nasopharyngeal Swab     Status: None   Collection Time: 03/21/21  9:38 AM   Specimen: Nasopharyngeal Swab; Nasopharyngeal(NP) swabs in vial transport medium  Result Value Ref Range Status   SARS Coronavirus 2 by RT PCR NEGATIVE NEGATIVE Final    Comment: (NOTE) SARS-CoV-2 target nucleic acids are NOT DETECTED.  The SARS-CoV-2 RNA is generally detectable in upper respiratory specimens during the acute phase of infection. The lowest concentration of SARS-CoV-2 viral copies this assay can detect is 138 copies/mL. A negative result does not preclude SARS-Cov-2 infection and should not be used as the sole basis for treatment or other patient management decisions. A negative result may occur with  improper specimen collection/handling, submission of specimen other than nasopharyngeal swab, presence of viral mutation(s) within the areas targeted by this assay, and inadequate number of viral copies(<138 copies/mL). A negative result must be combined with clinical observations, patient history, and epidemiological information. The expected result is Negative.  Fact Sheet for Patients:  EntrepreneurPulse.com.au  Fact Sheet for Healthcare Providers:  IncredibleEmployment.be  This test is no t yet approved or cleared by the [place] FDA and  has been authorized for detection and/or diagnosis of SARS-CoV-2 by FDA under an Emergency Use Authorization (EUA). This EUA will remain  in effect (meaning this test can be used) for the duration of the COVID-19 declaration under Section 564(b)(1) of the Act, 21 U.S.C.section 360bbb-3(b)(1), unless the authorization is terminated  or revoked sooner.       Influenza A by PCR NEGATIVE NEGATIVE Final   Influenza B by PCR NEGATIVE NEGATIVE Final    Comment: (NOTE) The Xpert Xpress SARS-CoV-2/FLU/RSV plus assay is intended as an aid in the diagnosis of influenza from Nasopharyngeal swab specimens and should  not be used as a sole basis for treatment. Nasal washings and aspirates are unacceptable for Xpert Xpress SARS-CoV-2/FLU/RSV testing.  Fact Sheet for Patients: EntrepreneurPulse.com.au  Fact Sheet for Healthcare Providers: IncredibleEmployment.be  This test is not yet approved or cleared by the [place] FDA and has been authorized for detection and/or diagnosis of SARS-CoV-2 by FDA under an Emergency Use Authorization (EUA). This EUA will remain in effect (meaning this test can be used) for the duration of the COVID-19 declaration under Section 564(b)(1) of the Act, 21 U.S.C. section 360bbb-3(b)(1), unless the authorization is terminated or revoked.  Performed at [facility], [address], [place], [place] [address]   MRSA Next Gen by PCR, Nasal     Status: None   Collection Time: 03/21/21  9:21 PM   Specimen: Nasal Mucosa; Nasal Swab  Result Value Ref Range Status   MRSA by PCR Next Gen NOT DETECTED NOT DETECTED Final    Comment: (NOTE) The GeneXpert MRSA Assay (FDA approved for NASAL specimens only), is one component of a comprehensive MRSA colonization surveillance program. It is not intended to diagnose MRSA infection nor to guide or monitor treatment for MRSA infections. Test performance is not FDA approved  in patients less than 90 years old. Performed at [facility], [place] [address] [place], [place] [address]          Radiology Studies: CT Angio Chest PE W and/or Wo Contrast  Result Date: 03/21/2021 CLINICAL DATA:  Shortness of breath, hypoxia EXAM: CT ANGIOGRAPHY CHEST WITH CONTRAST TECHNIQUE: Multidetector CT imaging of the chest was performed using the standard protocol during bolus administration of intravenous contrast. Multiplanar CT image reconstructions and MIPs were obtained to evaluate the vascular anatomy. CONTRAST:  35mL OMNIPAQUE IOHEXOL 350 MG/ML SOLN COMPARISON:   01/26/2017 FINDINGS: Cardiovascular: Scattered coronary artery calcifications are seen. There is homogeneous enhancement in thoracic aorta. Are no intraluminal filling defects in the central pulmonary artery branches. Evaluation of peripheral branches is limited by motion artifacts. Mediastinum/Nodes: No significant lymphadenopathy seen. There is inhomogeneous attenuation in the thyroid with possible low-density nodules. Lungs/Pleura: Small patchy infiltrates are seen in the posterior lower lung fields. There is no focal pulmonary consolidation. There is no significant pleural effusion or pneumothorax. Upper Abdomen: There are low-density foci in the liver which are not fully evaluated. This finding was not seen in the previous CT done on 01/26/2017. Surgical clips are seen in gallbladder fossa. Musculoskeletal: Unremarkable. Review of the MIP images confirms the above findings. IMPRESSION: There is no evidence of central pulmonary artery embolism. Evaluation of small peripheral branches is limited by motion artifacts, especially in the lower lung fields. There are new small patchy infiltrates in the lower lung fields suggesting subsegmental atelectasis/pneumonitis. There is no pleural effusion or pneumothorax. There are scattered coronary artery calcifications. There are multiple low-density foci of varying sizes in the liver. This may suggest scarring from previous intervention or inflammatory or neoplastic process. Follow-up multiphasic CT when the patient's clinical condition permits should be considered. Electronically Signed   By: Elmer Picker M.D.   On: 03/21/2021 12:03   US RENAL  Result Date: 03/21/2021 CLINICAL DATA:  Acute renal insufficiency EXAM: RENAL / URINARY TRACT ULTRASOUND COMPLETE COMPARISON:  None. FINDINGS: Right Kidney: Renal measurements: 12.1 x 6.0 x 6.5 cm = volume: 246 mL. Imaging is slightly limited by the depth of the structure as well as overlying bowel gas which obscures  the upper and lower poles of the right kidney partially. Echogenicity within normal limits. No mass or hydronephrosis visualized. Left Kidney: Renal measurements: 12.1 x 7.3 x 5.8 cm = volume: 270 mL. Similarly, imaging of the left kidney is slightly limited by the depth of the structure and overlying bowel gas which slightly limits evaluation of the upper and lower pole. Echogenicity within normal limits. No mass or hydronephrosis visualized. Bladder: Decompressed with a Foley catheter balloon seen within its lumen. Other: None. IMPRESSION: Slightly limited but unremarkable renal sonogram. Electronically Signed   By: Fidela Salisbury M.D.   On: 03/21/2021 19:10        Scheduled Meds:  atenolol  25 mg Oral Daily   Chlorhexidine Gluconate Cloth  6 each Topical Daily   colesevelam  625 mg Oral TID   dicyclomine  20 mg Oral TID AC & HS   dorzolamide-timolol  1 drop Both Eyes BID   doxycycline  100 mg Oral Q12H   enoxaparin (LOVENOX) injection  60 mg Subcutaneous Q24H   gabapentin  200 mg Oral BID   guaiFENesin  600 mg Oral BID   insulin aspart  0-15 Units Subcutaneous TID WC   insulin aspart  0-5 Units Subcutaneous QHS   latanoprost  1 drop Right  Eye QHS   loratadine  10 mg Oral Daily   mometasone-formoterol  2 puff Inhalation BID   Netarsudil-Latanoprost  1 drop Left Eye QHS   pantoprazole  40 mg Oral Daily   pramipexole  0.5 mg Oral QHS   pravastatin  10 mg Oral q1800   temazepam  15 mg Oral QHS   venlafaxine XR  75 mg Oral Q breakfast   vitamin B-12  1,000 mcg Oral Daily   Continuous Infusions:  cefTRIAXone (ROCEPHIN)  IV 2 g (03/22/21 1726)     LOS: 1 day     Cordelia Poche, MD [facility] 03/23/2021, 10:16 AM  If 7PM-7AM, please contact night-coverage [email or web address]

## 2021-03-23 NOTE — Care Management (Signed)
RE: [name] Date of Birth: Jul 05, 1940 Date: 03/23/2021 MUST ID: [id number]    To Whom It May Concern:   Please be advised that the above name patient will require a short-term nursing home stay--anticipated 30 days or less rehabilitation and strengthening. The plan is for return home.

## 2021-03-23 NOTE — Consult Note (Signed)
Reason for Consult: history of right total knee with pain after fall Referring Physician: Lonny Prude, MD  [name] is an 80 y.o. female.  HPI: [name] is a 80 y.o. female with medical history significant of HTN, GERD, MDD. Presenting with right knee pain. She has rectal fistula surgery 3 days ago. She was able to discharge to home on that day. She c/o urinary retention, so she went to the surgery clinic for a foley placement. When she got back home, she felt a little dizzy (she reports that she had taken her pain medicines). She sat down, but then fell out of her chair and hurt her knee. There was no head injury or LOC. Her husband called for EMS. They came out and evaluated her. She declined transfer to the ED at that time and was able to get to bed without incident. The morning she woke up and was finding it difficult to transfer to a bedside commode. Her husband became concerned and called for EMS to bring her to the ED. They deny any other aggravating or alleviating factors.    She complains of right knee pain and stiffness.  She has noted some improvement in her pain level but admits that she has not done any activity nor been out of bed since admission.  She notes generalized fatigue and weakness  Past Medical History:  Diagnosis Date   Allergy    Anemia    Anxiety    Arthritis    right knee;injections every 23months    Asthma    use daily   Breast cancer ([place]) 1994/1995   HX BREAST CANCER/ right brreast and left breast in 1995   Bronchiectasis ([place])    COMPRESSION FRACTURE, LUMBAR VERTEBRAE 08/21/2008   Qualifier: Diagnosis of  By: Arnoldo Morale MD, Balinda Quails    Depression    Diabetes mellitus    takes Amaryl and Januvia daily   Diverticulitis    Dyspnea    with activity   Early cataracts, bilateral    Eczema    Endometriosis    Fatty liver    Gastritis    Glaucoma    Hammer toe    History of kidney stones    Hyperlipidemia associated with type 2 diabetes mellitus ([place])  07/26/2007   Diet/exercise control.     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   Thyroid disease    Vitamin D deficiency    takes Vit d daily   Weakness    uses a walker and wheelchair    Past Surgical History:  Procedure Laterality Date   ADENOIDECTOMY     at age 67   ANAL FISTULOTOMY N/A 03/07/2020   Procedure: ANAL FISTULOTOMY WITH MARSUPIALIZATION;  Surgeon: Michael Boston, MD;  Location: [facility];  Service: General;  Laterality: N/A;   BIOPSY  12/19/2018   Procedure: BIOPSY;  Surgeon: Mauri Pole, MD;  Location: [facility];  Service: Endoscopy;;   CATARACT EXTRACTION     CHOLECYSTECTOMY  06/2008   COLONOSCOPY WITH PROPOFOL N/A 05/17/2017   Procedure: COLONOSCOPY WITH PROPOFOL;  Surgeon: Mauri Pole, MD;  Location: [facility];  Service: Endoscopy;  Laterality: N/A;  PT WILL BE ADMITTED THE DAY BEFORE FOR PREP PER ROBIN KB   COLONOSCOPY WITH PROPOFOL N/A 12/19/2018   Procedure: COLONOSCOPY WITH PROPOFOL;  Surgeon: Mauri Pole, MD;  [facility];  Service: Endoscopy;  Laterality: N/A;   DILATION AND CURETTAGE OF UTERUS     excision on breast     internal infected suture from breast surgery   excision removed from neck  2008   infected lymph node   HEMORRHOID SURGERY N/A 11/22/2019   Procedure: HEMORRHOIDECTOMY LIGATION ,  PEXY;  Surgeon: Michael Boston, MD;  Location: [place];  Service: General;  Laterality: N/A;   HYPERBARIC OXYGEN THERAPY     FEET INFECTION   HYSTEROSCOPY  06/22/2011   PMB submucosal myoma   JOINT REPLACEMENT  2014   left total shoulder   LAPAROSCOPIC OOPHORECTOMY Right 12/2004   absent LSO   LAPAROTOMY  1977   LIGATION OF INTERNAL FISTULA TRACT N/A 11/22/2019   Procedure: REPAIR OF PERIRECTAL FISTULA, ANORECTAL EXAMINATION UNDER ANESTHESIA;  Surgeon: Michael Boston, MD;  Location: [place];  Service: General;  Laterality: N/A;   MASTECTOMY Bilateral 1994 right, 1995 left   PLACEMENT OF SETON N/A 03/19/2021   Procedure: LIMBERG RHOMBOID ROTATIONAL FLAP CLOSURE;  Surgeon: Michael Boston, MD;  Location: [facility];  Service: General;  Laterality: N/A;   POLYPECTOMY  12/19/2018   Procedure: POLYPECTOMY;  Surgeon: Mauri Pole, MD;  [facility];  Service: Endoscopy;;   RECTAL EXAM UNDER ANESTHESIA N/A 03/07/2020   Procedure: ANORECTAL EXAM UNDER ANESTHESIA;  Surgeon: Michael Boston, MD;  Location: [place];  Service: General;  Laterality: N/A;   RECTAL EXAM UNDER ANESTHESIA N/A 03/19/2021   Procedure: RECTAL EXAM UNDER ANESTHESIA;  Surgeon: Michael Boston, MD;  Location: [facility];  Service: General;  Laterality: N/A;   SHOULDER HEMI-ARTHROPLASTY  01/01/2012   Procedure: SHOULDER HEMI-ARTHROPLASTY;  Surgeon: Roseanne Kaufman, MD;  Location: [place];  Service: Orthopedics;  Laterality: Left;  Left Shoulder Hemi Arthroplasty with Repair and Reconstruction as Necessary    THYROIDECTOMY  1975   76yrs ago. follows endocrine   TONSILLECTOMY     as a child   TOTAL KNEE ARTHROPLASTY Right 12/30/2014   Procedure: RIGHT TOTAL KNEE ARTHROPLASTY;  Surgeon: Gaynelle Arabian, MD;  Location: [facility];  Service: Orthopedics;  Laterality: Right;   TOTAL KNEE ARTHROPLASTY Left 11/29/2016   Procedure: LEFT TOTAL KNEE ARTHROPLASTY;  Surgeon: Gaynelle Arabian,  MD;  Location[facility];  Service: Orthopedics;  Laterality: Left;    Family History  Problem Relation Age of Onset   Arthritis Mother    COPD Father    Heart disease Father        MI 43 - states he died of lockjaw   Hypertension Father    Hyperlipidemia Father    Pancreatic cancer Paternal Grandfather    Colon cancer Neg Hx    Esophageal cancer Neg Hx    Rectal cancer Neg Hx    Stomach cancer Neg Hx  Social History:  reports that she has never smoked. She has never used smokeless tobacco. She reports that she does not drink alcohol and does not use drugs.  Allergies:  Allergies  Allergen Reactions   Nitrofurantoin Other (See Comments)    Severe headache HEADACHE   Levaquin [Levofloxacin In D5w] Other (See Comments)    Pain in tendons    Brimonidine Other (See Comments)    Made eyes and surrounding areas RED/ was an eye drop   Cephalexin Diarrhea and Other (See Comments)    Patient can't remember reaction (per chart at [facility] states diarrhea) (tolerates Augmentin fine)   Erythromycin Ethylsuccinate Hives and Diarrhea   Oxycodone Itching    Medications: I have reviewed the patient's current medications. Scheduled:  atenolol  25 mg Oral Daily   Chlorhexidine Gluconate Cloth  6 each Topical Daily   colesevelam  625 mg Oral TID   dicyclomine  20 mg Oral TID AC & HS   dorzolamide-timolol  1 drop Both Eyes BID   doxycycline  100 mg Oral Q12H   enoxaparin (LOVENOX) injection  60 mg Subcutaneous Q24H   gabapentin  200 mg Oral BID   guaiFENesin  600 mg Oral BID   insulin aspart  0-15 Units Subcutaneous TID WC   insulin aspart  0-5 Units Subcutaneous QHS   latanoprost  1 drop Right Eye QHS   loratadine  10 mg Oral Daily   mometasone-formoterol  2 puff Inhalation BID   Netarsudil-Latanoprost  1 drop Left Eye QHS   pantoprazole  40 mg Oral Daily   pramipexole  0.5 mg Oral QHS   pravastatin  10 mg Oral q1800   temazepam  15 mg Oral QHS   venlafaxine XR  75 mg Oral Q  breakfast   vitamin B-12  1,000 mcg Oral Daily    Results for orders placed or performed during the hospital encounter of 03/21/21 (from the past 24 hour(s))  Glucose, capillary     Status: Abnormal   Collection Time: 03/22/21  4:58 PM  Result Value Ref Range   Glucose-Capillary 178 (H) 70 - 99 mg/dL  Glucose, capillary     Status: Abnormal   Collection Time: 03/22/21  9:14 PM  Result Value Ref Range   Glucose-Capillary 196 (H) 70 - 99 mg/dL  Glucose, capillary     Status: Abnormal   Collection Time: 03/23/21  7:36 AM  Result Value Ref Range   Glucose-Capillary 186 (H) 70 - 99 mg/dL  Glucose, capillary     Status: Abnormal   Collection Time: 03/23/21 11:24 AM  Result Value Ref Range   Glucose-Capillary 226 (H) 70 - 99 mg/dL   *Note: Due to a large number of results and/or encounters for the requested time period, some results have not been displayed. A complete set of results can be found in Results Review.    X-ray: CLINICAL DATA:  pain after fall, h/o knee replacement   EXAM: RIGHT KNEE - COMPLETE 4+ VIEW   COMPARISON:  Radiograph 12/05/2016   FINDINGS: There is a 3 component total knee arthroplasty in normal alignment without evidence of loosening or periprosthetic fracture. Trace joint fluid.   IMPRESSION: No evidence of right knee arthroplasty complication. No acute osseous abnormality.     Electronically Signed   By: Maurine Simmering M.D.  I did review the images personally and agree that there is no obvious findings for fracture  ROS Weakness  Deconditioning  Otherwise that noted in the HPI  Blood  pressure (!) 140/53, pulse 74, temperature 99 F (37.2 C), temperature source Oral, resp. rate 18, height 6' (1.829 m), weight 122.3 kg, last menstrual period 05/10/1992, SpO2 92 %.  Physical Exam: General: 80 y.o. female resting in bed in NAD Eyes: PERRL, normal sclera ENMT: Nares patent w/o discharge, orophaynx clear, dentition normal, ears w/o  discharge/lesions/ulcers Neck: Supple, trachea midline Cardiovascular: RRR, +S1, S2, no g/r, 2/6 SEM, equal pulses throughout Respiratory: decreased at bases, no w/r/r, normal WOB GI: BS+, NDNT, no masses noted, no organomegaly noted  MSK: Right knee incision healed without signs of infection. Palpable effusion No warmth Full knee extension Flexion limited by position in bed and stiffness to over 100 degree  Skin: No rashes, bruises, ulcerations noted Neuro: A&O x 3, no focal deficits Psyc: Appropriate interaction and affect, calm/cooperative  Assessment/Plan: Right knee pain and swelling with history of total knee arthroplasty  Plan: Today upon review of her presentation, exam and radiographs I aspirated her right knee removing 50 cc of blood.  I could not tell if there was any fatty component to it with regards to fracture. I will order a MRI of the right knee to rule out occult fracture versus soft tissue injury and if so the location to determine restrictions with regards to weight bearing Ice to right knee for now Will follow up with recs following the MRI In the interim I would restrict weight bearing to touch down weight bearing for transfers until further clarity of diagnosis   Mauri Pole 03/23/2021, 3:09 PM

## 2021-03-24 LAB — GLUCOSE, CAPILLARY
Glucose-Capillary: 218 mg/dL — ABNORMAL HIGH (ref 70–99)
Glucose-Capillary: 207 mg/dL — ABNORMAL HIGH (ref 70–99)
Glucose-Capillary: 254 mg/dL — ABNORMAL HIGH (ref 70–99)
Glucose-Capillary: 241 mg/dL — ABNORMAL HIGH (ref 70–99)

## 2021-03-24 MED ORDER — DESVENLAFAXINE SUCCINATE ER 50 MG PO TB24
50.0000 mg | ORAL_TABLET | Freq: Every day | ORAL | Status: DC
  Administered 2021-03-25 – 2021-03-26 (×2): 50 mg via ORAL
  Filled 2021-03-24 (×2): qty 1

## 2021-03-24 NOTE — Assessment & Plan Note (Signed)
Body mass index is 36.56 kg/m.

## 2021-03-24 NOTE — TOC Progression Note (Signed)
Transition of Care [facility]) - Progression Note   Patient Details  Name: [name] MRN: [id number] Date of Birth: [birth year]  Transition of Care [facility]) CM/SW [place], LCSW Phone Number: 03/24/2021, 3:09 PM  Clinical Narrative: Patient's PASRR was received: [id number] E. CSW provided bed offers to patient and her husband. Family has chosen [facility]. CSW left VM with Lorenza Chick in admissions to confirm patient will have a bed available tomorrow. TOC to start insurance authorization after the bed is confirmed.  Expected Discharge Plan: [place] Barriers to Discharge: Continued Medical Work up  Expected Discharge Plan and Services Expected Discharge Plan: [place] Choice: [place] arrangements for the past 2 months: Single Family Home  Readmission Risk Interventions No flowsheet data found.

## 2021-03-24 NOTE — Progress Notes (Addendum)
Physical Therapy Treatment Patient Details Name: [name] MRN: [id number] DOB: Nov 08, 1940 Today's Date: 03/24/2021   History of Present Illness Pt admitted from home s/p fall with R knee pain (R knee clear on imaging) weakness and inability to mobilize at home.  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    PT Comments    Pt is an 80 y.o. female with above HPI resulting in the deficits listed below (see PT Problem List). Pt with decreased pain levels in R knee today demonstrating significantly improved mobility requiring decreased assist. Pt and husband report that pt has been limited to recliner to/from [facility] transfers at home for months. Pt performed sit to stand transfers with MIN A+2 for safety from elevated bed height and cues for safe hand placement. Pt ambulated total of ~28ft with MIN A+1 for stability, no LOB observed. PT currently recommending SNF due to increased +2 assist upon eval and limited mobility/activity tolerance. If pt demonstrates continued and consistent progress with mobility may be able to d/c to home with family and  HHPT. Primary limitations to mobility seem to be related to pain and pt's motivation/desire to progress. Pt will benefit from skilled PT to maximize functional mobility to increase independence.     Recommendations for follow up therapy are one component of a multi-disciplinary discharge planning process, led by the attending physician.  Recommendations may be updated based on patient status, additional functional criteria and insurance authorization.  Follow Up Recommendations  Skilled nursing-short term rehab (<3 hours/day) (vs. home with HHPT pending progress)     Assistance Recommended at Discharge Frequent or constant Supervision/Assistance  Equipment Recommendations  None recommended by PT    Recommendations for  Other Services       Precautions / Restrictions Precautions Precautions: Fall Restrictions Weight Bearing Restrictions: No     Mobility  Bed Mobility Overal bed mobility: Needs Assistance Bed Mobility: Supine to Sit     Supine to sit: Supervision;HOB elevated     General bed mobility comments: supervision for safety, HOB elevated. Pt with use of bed rails    Transfers Overall transfer level: Needs assistance Equipment used: Rolling walker (2 wheels) Transfers: Sit to/from Stand Sit to Stand: +2 safety/equipment;From elevated surface;Min assist           General transfer comment: elevated bed to accomodate for pt's height. Reports she has been sleeping in recliner.    Ambulation/Gait Ambulation/Gait assistance: Min assist;+2 safety/equipment Gait Distance (Feet): 60 Feet Assistive device: Rolling walker (2 wheels) Gait Pattern/deviations: Decreased step length - right;Decreased step length - left;Step-through pattern Gait velocity: decr     General Gait Details: +2 assist provided initially due to limited mobility with last PT session. Pt with decreased pain in R knee and able to progress to MIN A+1 with use of RW. Overall steady, forward flexed/kyphotic posture. Close chair follow for safety with progression of mobility. Pt self limiting with further ambulation   Stairs             Wheelchair Mobility    Modified Rankin (Stroke Patients Only)       Balance Overall balance assessment: Needs assistance Sitting-balance support: No upper extremity supported;Feet supported Sitting balance-Leahy Scale: Fair     Standing balance support: Bilateral upper extremity supported Standing balance-Leahy Scale: Poor  Cognition Arousal/Alertness: Awake/alert Behavior During Therapy: WFL for tasks assessed/performed Overall Cognitive Status: Within Functional Limits for tasks assessed                                           Exercises      General Comments General comments (skin integrity, edema, etc.): Discussed with pt, pt's husband, RN and NT on pt using BSC vs. brief/purewick for toileting while pt having decreased pain levels and improved mobility. Educated pt on benefits of continued mobility and for skin integrity with using BSC/toilet vs. briefs/purewicks when able (pt reports history of urgency). Advised nursing staff +2 for safety with mobility.      Pertinent Vitals/Pain Pain Assessment: No/denies pain Pain Intervention(s): Limited activity within patient's tolerance;Monitored during session;Repositioned    Home Living                          Prior Function            PT Goals (current goals can now be found in the care plan section) Acute Rehab PT Goals Patient Stated Goal: Regain IND PT Goal Formulation: With patient Time For Goal Achievement: 04/05/21 Potential to Achieve Goals: Good Progress towards PT goals: Progressing toward goals    Frequency    Min 3X/week      PT Plan Current plan remains appropriate    Co-evaluation              AM-PAC PT "6 Clicks" Mobility   Outcome Measure  Help needed turning from your back to your side while in a flat bed without using bedrails?: A Little Help needed moving from lying on your back to sitting on the side of a flat bed without using bedrails?: A Lot Help needed moving to and from a bed to a chair (including a wheelchair)?: A Little Help needed standing up from a chair using your arms (e.g., wheelchair or bedside chair)?: A Little Help needed to walk in hospital room?: A Little Help needed climbing 3-5 steps with a railing? : A Lot 6 Click Score: 16    End of Session Equipment Utilized During Treatment: Gait belt Activity Tolerance: Patient tolerated treatment well Patient left: with call bell/phone within reach;in chair;with chair alarm set;with family/visitor present (elevated recliner  chair with pillows/cushion) Nurse Communication: Mobility status PT Visit Diagnosis: Unsteadiness on feet (R26.81);Muscle weakness (generalized) (M62.81);Difficulty in walking, not elsewhere classified (R26.2);Pain Pain - Right/Left: Right Pain - part of body: Knee     Time: 1200-1221 PT Time Calculation (min) (ACUTE ONLY): 21 min  Charges:  $Therapeutic Activity: 8-22 mins                     Festus Barren PT, DPT  Acute Rehabilitation Services  Office [phone]   03/24/2021, 2:04 PM

## 2021-03-24 NOTE — Assessment & Plan Note (Signed)
-  Resume home Pristiq (patient to use her home stock) -Continue Restoril QHS

## 2021-03-24 NOTE — Progress Notes (Signed)
[name] [id number] 06-May-1941  CARE TEAM:  PCP: Marin Olp, MD  Outpatient Care Team: Patient Care Team: Marin Olp, MD as PCP - General (Family Medicine) Michael Boston, MD as Consulting Physician (General Surgery) Mauri Pole, MD as Consulting Physician (Gastroenterology) Jerline Pain, MD as Consulting Physician (Cardiology) Gaynelle Arabian, MD as Consulting Physician (Orthopedic Surgery) Jaquita Folds, MD as Consulting Physician (Gynecology)  Inpatient Treatment Team: Treatment Team: Attending Provider: Mariel Aloe, MD; Rounding Team: Fatima Blank, MD; Consulting Physician: Michael Boston, MD; Consulting Physician: Paralee Cancel, MD; Technician: Bettina Gavia, NT; Physical Therapist: Estanislado Spire, PT; Pharmacist: Dimple Nanas, [facility]; Registered Nurse: Jenean Lindau, RN   Problem List:   Active Problems:   PNA (pneumonia)   Effusion of right knee   Acute respiratory failure with hypoxia ([place])  03/19/2021:   POST-OPERATIVE DIAGNOSIS:  RECURRENT PERIRECTAL / PERINEAL SINUS TRACT   PROCEDURE:   ANORECTAL EXAM UNDER ANESTHESIA LIMBERG RHOMBOID ROTATIONAL FLAP CLOSURE   SURGEON:  Adin Hector, MD      Assessment  No evidence of any wound breakdown status post small skin flap perineal chronic wound  [facility] Stay = 2 days)  Plan:  Defer work-up to pneumonia/atelectasis to primary service.  Knee effusion drain.  MRI pending.  Defer to them.  Diet as tolerated.  Bowel regimen.  Seem to be urinating with Foley catheter out.  Not ideal to have a urine wick covering my dressing, but no strong evidence of any bleeding or breakdown yet.  Hopefully Dermabond will stay on.  We will see   She is probably overdue to see urogynecology for her chronic recurrent UTI issues.  -VTE prophylaxis- SCDs, etc -mobilize as tolerated to help recovery        20 minutes spent in review, evaluation,  examination, counseling, and coordination of care.   I have reviewed this patient's available data, including medical history, events of note, physical examination and test results as part of my evaluation.  A significant portion of that time was spent in counseling.  Care during the described time interval was provided by me.  03/24/2021    Subjective: (Chief complaint)  Patient resting.  Having flatus.  Denies any perineal pain.  Foley catheter out.  Using urine wick.  Right knee swollen got tapped.  MRI last night.  Objective:  Vital signs:  Vitals:   03/23/21 1357 03/23/21 1943 03/23/21 2144 03/24/21 0500  BP: (!) 140/53  (!) 160/53 (!) 156/67  Pulse: 74  84 77  Resp: 18  16 19   Temp: 99 F (37.2 C)  98.2 F (36.8 C) 98.4 F (36.9 C)  TempSrc: Oral  Oral Oral  SpO2: 92% 93% 94% 95%  Weight:      Height:        Last BM Date: 03/23/21  Intake/Output   Yesterday:  11/14 0701 - 11/15 0700 In: 480 [P.O.:480] Out: 1850 [Urine:1850] This shift:  No intake/output data recorded.  Bowel function:  Flatus: YES  BM:  No  Drain: (No drain)   Physical Exam:  General: Pt awake/alert in no acute distress Eyes: PERRL, normal EOM.  Sclera clear.  No icterus Neuro: CN II-XII intact w/o focal sensory/motor deficits. Lymph: No head/neck/groin lymphadenopathy Psych:  No delerium/psychosis/paranoia.  Oriented x 4 HENT: Normocephalic, Mucus membranes moist.  No thrush Neck: Supple, No tracheal deviation.  No obvious thyromegaly Chest: No pain to chest wall compression.  Good respiratory excursion.  No  audible wheezing CV:  Pulses intact.  Regular rhythm.  No major extremity edema MS: Normal AROM mjr joints.  No obvious deformity  Abdomen: Soft.  Nondistended.  Mildly tender at incisions only.  No evidence of peritonitis.  No incarcerated hernias.  GU: Perineal urine wick in place covering perineal incision Ext:   No deformity.  No mjr edema.  No cyanosis Skin: No  petechiae / purpurea.  No major sores.  Warm and dry    Results:   Cultures: Recent Results (from the past 720 hour(s))  Urine Culture     Status: Abnormal   Collection Time: 03/04/21 12:14 PM   Specimen: Urine  Result Value Ref Range Status   MICRO NUMBER: [id number]  Final   SPECIMEN QUALITY: Adequate  Final   Sample Source NOT GIVEN  Final   STATUS: FINAL  Final   ISOLATE 1: Escherichia coli (A)  Final    Comment: Greater than 100,000 CFU/mL of Escherichia coli      Susceptibility   Escherichia coli - URINE CULTURE, REFLEX    AMOX/CLAVULANIC <=2 Sensitive     AMPICILLIN 8 Sensitive     AMPICILLIN/SULBACTAM 4 Sensitive     CEFAZOLIN* <=4 Not Reportable      * For infections other than uncomplicated UTI caused by E. coli, K. pneumoniae or P. mirabilis: Cefazolin is resistant if MIC > or = 8 mcg/mL. (Distinguishing susceptible versus intermediate for isolates with MIC < or = 4 mcg/mL requires additional testing.) For uncomplicated UTI caused by E. coli, K. pneumoniae or P. mirabilis: Cefazolin is susceptible if MIC <32 mcg/mL and predicts susceptible to the oral agents cefaclor, cefdinir, cefpodoxime, cefprozil, cefuroxime, cephalexin and loracarbef.     CEFTAZIDIME <=1 Sensitive     CEFEPIME <=1 Sensitive     CEFTRIAXONE <=1 Sensitive     CIPROFLOXACIN <=0.25 Sensitive     LEVOFLOXACIN <=0.12 Sensitive     GENTAMICIN <=1 Sensitive     IMIPENEM <=0.25 Sensitive     NITROFURANTOIN <=16 Sensitive     PIP/TAZO <=4 Sensitive     TOBRAMYCIN <=1 Sensitive     TRIMETH/SULFA* >=320 Resistant      * For infections other than uncomplicated UTI caused by E. coli, K. pneumoniae or P. mirabilis: Cefazolin is resistant if MIC > or = 8 mcg/mL. (Distinguishing susceptible versus intermediate for isolates with MIC < or = 4 mcg/mL requires additional testing.) For uncomplicated UTI caused by E. coli, K. pneumoniae or P. mirabilis: Cefazolin is susceptible if MIC <32 mcg/mL and  predicts susceptible to the oral agents cefaclor, cefdinir, cefpodoxime, cefprozil, cefuroxime, cephalexin and loracarbef. Legend: S = Susceptible  I = Intermediate R = Resistant  NS = Not susceptible * = Not tested  NR = Not reported **NN = See antimicrobic comments   Resp Panel by RT-PCR (Flu A&B, Covid) Nasopharyngeal Swab     Status: None   Collection Time: 03/21/21  9:38 AM   Specimen: Nasopharyngeal Swab; Nasopharyngeal(NP) swabs in vial transport medium  Result Value Ref Range Status   SARS Coronavirus 2 by RT PCR NEGATIVE NEGATIVE Final    Comment: (NOTE) SARS-CoV-2 target nucleic acids are NOT DETECTED.  The SARS-CoV-2 RNA is generally detectable in upper respiratory specimens during the acute phase of infection. The lowest concentration of SARS-CoV-2 viral copies this assay can detect is 138 copies/mL. A negative result does not preclude SARS-Cov-2 infection and should not be used as the sole basis for treatment or other patient management decisions. A  negative result may occur with  improper specimen collection/handling, submission of specimen other than nasopharyngeal swab, presence of viral mutation(s) within the areas targeted by this assay, and inadequate number of viral copies(<138 copies/mL). A negative result must be combined with clinical observations, patient history, and epidemiological information. The expected result is Negative.  Fact Sheet for Patients:  EntrepreneurPulse.com.au  Fact Sheet for Healthcare Providers:  IncredibleEmployment.be  This test is no t yet approved or cleared by the [place] FDA and  has been authorized for detection and/or diagnosis of SARS-CoV-2 by FDA under an Emergency Use Authorization (EUA). This EUA will remain  in effect (meaning this test can be used) for the duration of the COVID-19 declaration under Section 564(b)(1) of the Act, 21 U.S.C.section 360bbb-3(b)(1), unless the  authorization is terminated  or revoked sooner.       Influenza A by PCR NEGATIVE NEGATIVE Final   Influenza B by PCR NEGATIVE NEGATIVE Final    Comment: (NOTE) The Xpert Xpress SARS-CoV-2/FLU/RSV plus assay is intended as an aid in the diagnosis of influenza from Nasopharyngeal swab specimens and should not be used as a sole basis for treatment. Nasal washings and aspirates are unacceptable for Xpert Xpress SARS-CoV-2/FLU/RSV testing.  Fact Sheet for Patients: EntrepreneurPulse.com.au  Fact Sheet for Healthcare Providers: IncredibleEmployment.be  This test is not yet approved or cleared by the [place] FDA and has been authorized for detection and/or diagnosis of SARS-CoV-2 by FDA under an Emergency Use Authorization (EUA). This EUA will remain in effect (meaning this test can be used) for the duration of the COVID-19 declaration under Section 564(b)(1) of the Act, 21 U.S.C. section 360bbb-3(b)(1), unless the authorization is terminated or revoked.  Performed at [facility], [address], [place], [place] [address]   MRSA Next Gen by PCR, Nasal     Status: None   Collection Time: 03/21/21  9:21 PM   Specimen: Nasal Mucosa; Nasal Swab  Result Value Ref Range Status   MRSA by PCR Next Gen NOT DETECTED NOT DETECTED Final    Comment: (NOTE) The GeneXpert MRSA Assay (FDA approved for NASAL specimens only), is one component of a comprehensive MRSA colonization surveillance program. It is not intended to diagnose MRSA infection nor to guide or monitor treatment for MRSA infections. Test performance is not FDA approved in patients less than 93 years old. Performed at [facility], [place] [address] [place], [place] [address]     Labs: Results for orders placed or performed during the hospital encounter of 03/21/21 (from the past 48 hour(s))  Glucose, capillary     Status: Abnormal   Collection  Time: 03/22/21  8:13 AM  Result Value Ref Range   Glucose-Capillary 194 (H) 70 - 99 mg/dL    Comment: Glucose reference range applies only to samples taken after fasting for at least 8 hours.  Glucose, capillary     Status: Abnormal   Collection Time: 03/22/21 11:39 AM  Result Value Ref Range   Glucose-Capillary 272 (H) 70 - 99 mg/dL    Comment: Glucose reference range applies only to samples taken after fasting for at least 8 hours.  Glucose, capillary     Status: Abnormal   Collection Time: 03/22/21  4:58 PM  Result Value Ref Range   Glucose-Capillary 178 (H) 70 - 99 mg/dL    Comment: Glucose reference range applies only to samples taken after fasting for at least 8 hours.  Glucose, capillary     Status: Abnormal  Collection Time: 03/22/21  9:14 PM  Result Value Ref Range   Glucose-Capillary 196 (H) 70 - 99 mg/dL    Comment: Glucose reference range applies only to samples taken after fasting for at least 8 hours.  Glucose, capillary     Status: Abnormal   Collection Time: 03/23/21  7:36 AM  Result Value Ref Range   Glucose-Capillary 186 (H) 70 - 99 mg/dL    Comment: Glucose reference range applies only to samples taken after fasting for at least 8 hours.  Glucose, capillary     Status: Abnormal   Collection Time: 03/23/21 11:24 AM  Result Value Ref Range   Glucose-Capillary 226 (H) 70 - 99 mg/dL    Comment: Glucose reference range applies only to samples taken after fasting for at least 8 hours.  Glucose, capillary     Status: Abnormal   Collection Time: 03/23/21  4:09 PM  Result Value Ref Range   Glucose-Capillary 193 (H) 70 - 99 mg/dL    Comment: Glucose reference range applies only to samples taken after fasting for at least 8 hours.  Glucose, capillary     Status: Abnormal   Collection Time: 03/23/21  9:40 PM  Result Value Ref Range   Glucose-Capillary 241 (H) 70 - 99 mg/dL    Comment: Glucose reference range applies only to samples taken after fasting for at least 8  hours.   *Note: Due to a large number of results and/or encounters for the requested time period, some results have not been displayed. A complete set of results can be found in Results Review.    Imaging / Studies: No results found.  Medications / Allergies: per chart  Antibiotics: Anti-infectives (From admission, onward)    Start     Dose/Rate Route Frequency Ordered Stop   03/21/21 2200  doxycycline (VIBRA-TABS) tablet 100 mg        100 mg Oral Every 12 hours 03/21/21 1757     03/21/21 1845  cefTRIAXone (ROCEPHIN) 2 g in sodium chloride 0.9 % 100 mL IVPB        2 g 200 mL/hr over 30 Minutes Intravenous Daily-1800 03/21/21 1757 03/26/21 1759   03/21/21 1245  doxycycline (VIBRAMYCIN) 100 mg in sodium chloride 0.9 % 250 mL IVPB        100 mg 125 mL/hr over 120 Minutes Intravenous  Once 03/21/21 1242 03/21/21 1459         Note: Portions of this report may have been transcribed using voice recognition software. Every effort was made to ensure accuracy; however, inadvertent computerized transcription errors may be present.   Any transcriptional errors that result from this process are unintentional.    Adin Hector, MD, FACS, MASCRS Esophageal, Gastrointestinal & Colorectal Surgery Robotic and Minimally Invasive Surgery  [facility], [place]  [place]. [address], [place], [place] [address] [phone] Fax [phone] Main  CONTACT INFORMATION:  Weekday (9AM-5PM): Call [facility] main office at [phone]  Weeknight (5PM-9AM) or Weekend/Holiday: Check [email or web address] (password " [email or web address]") for General Surgery [facility] coverage  (Please, do not use SecureChat as it is not reliable communication to operating surgeons for immediate patient care)      03/24/2021  7:35 AM

## 2021-03-24 NOTE — Progress Notes (Addendum)
PROGRESS NOTE    [name]  ZOX:[id number] DOB: 03-28-1941 DOA: 03/21/2021 PCP: Marin Olp, MD   Brief Narrative: [name] is a 80 y.o. female with a history of hypertension, GERD, depression. Patient presented secondary to right knee pain and was found to have evidence of pneumonia with associated hypoxia. Started empirically on Ceftriaxone and azithromycin. Right knee with moderate-large effusion; no fracture on x-ray imaging.   Assessment & Plan:   * Acute respiratory failure with hypoxia ([facility]) Secondary to pneumonia. Hypoxia down to 88%. -Wean to room air as able -Ambulatory pulse ox  Effusion of right knee Likely reactive and secondary to recent fall. Likely contributing to pain with ambulation and complicating her ability to ambulate safely. Lives at home with her husband. PT/OT recommending SNF. Orthopedic surgery evaluated and aspirated 50 cc of bloody fluid. MRI obtained and was significant for no evidence of occult fracture -Orthopedic surgery recommendations: pending today  PNA (pneumonia) Started empirically on Ceftriaxone and azithromycin. Leukocytosis resolved. No blood cultures obtained on admission -Continue Ceftriaxone/azithromycin  Recurrent major depression in full remission ([place]) -Resume home Pristiq (patient to use her home stock) -Continue Restoril QHS  Obesity (BMI 30-39.9) Body mass index is 36.56 kg/m.  DVT prophylaxis: Lovenox Code Status:   Code Status: Full Code Family Communication: Husband at bedside Disposition Plan: Discharge SNF likely in 1 day pending SNF bed in addition to orthopedic surgery recommendations for knee effusion   Consultants:  Orthopedic surgery ([facility])  Procedures:  None  Antimicrobials: Ceftriaxone Azithromycin    Subjective: Not feeling great today but thinks it is because she is not getting her Pristiq while in the hospital. Had a knee aspiration yesterday. No other  concerns  Objective: Vitals:   03/23/21 1943 03/23/21 2144 03/24/21 0500 03/24/21 0947  BP:  (!) 160/53 (!) 156/67   Pulse:  84 77   Resp:  16 19   Temp:  98.2 F (36.8 C) 98.4 F (36.9 C)   TempSrc:  Oral Oral   SpO2: 93% 94% 95% 95%  Weight:      Height:        Intake/Output Summary (Last 24 hours) at 03/24/2021 1306 Last data filed at 03/24/2021 1025 Gross per 24 hour  Intake 600 ml  Output 1850 ml  Net -1250 ml    Filed Weights   03/21/21 0923  Weight: 122.3 kg    Examination:  General exam: Appears calm and comfortable Respiratory system: Clear to auscultation. Respiratory effort normal. Cardiovascular system: S1 & S2 heard, RRR. No murmurs, rubs, gallops or clicks. Gastrointestinal system: Abdomen is nondistended, soft and nontender. No organomegaly or masses felt. Normal bowel sounds heard. Central nervous system: Alert and oriented. No focal neurological deficits. Musculoskeletal: Right knee effusion with no overlying redness or tenderness/warmth. No calf tenderness Skin: No cyanosis. No rashes Psychiatry: Judgement and insight appear normal. Mood & affect appropriate.     Data Reviewed: I have personally reviewed following labs and imaging studies  CBC Lab Results  Component Value Date   WBC 8.4 03/22/2021   RBC 3.57 (L) 03/22/2021   HGB 10.6 (L) 03/22/2021   HCT 33.9 (L) 03/22/2021   MCV 95.0 03/22/2021   MCH 29.7 03/22/2021   PLT 211 03/22/2021   MCHC 31.3 03/22/2021   RDW 14.5 03/22/2021   LYMPHSABS 1.3 12/18/2020   MONOABS 0.7 12/18/2020   EOSABS 0.3 12/18/2020   BASOSABS 0.0 81/19/1478     Last metabolic panel Lab Results  Component  Value Date   NA 140 03/22/2021   K 3.6 03/22/2021   CL 102 03/22/2021   CO2 34 (H) 03/22/2021   BUN 16 03/22/2021   CREATININE 0.74 03/22/2021   GLUCOSE 163 (H) 03/22/2021   GFRNONAA >60 03/22/2021   GFRAA 96 10/29/2020   CALCIUM 8.2 (L) 03/22/2021   PROT 5.8 (L) 03/22/2021   ALBUMIN 2.9 (L)  03/22/2021   BILITOT 0.6 03/22/2021   ALKPHOS 68 03/22/2021   AST 13 (L) 03/22/2021   ALT 12 03/22/2021   ANIONGAP 4 (L) 03/22/2021    CBG (last 3)  Recent Labs    03/23/21 2140 03/24/21 0747 03/24/21 1139  GLUCAP 241* 218* 207*      GFR: Estimated Creatinine Clearance: 80.8 mL/min (by C-G formula based on SCr of 0.74 mg/dL).  Coagulation Profile: No results for input(s): INR, PROTIME in the last 168 hours.  Recent Results (from the past 240 hour(s))  Resp Panel by RT-PCR (Flu A&B, Covid) Nasopharyngeal Swab     Status: None   Collection Time: 03/21/21  9:38 AM   Specimen: Nasopharyngeal Swab; Nasopharyngeal(NP) swabs in vial transport medium  Result Value Ref Range Status   SARS Coronavirus 2 by RT PCR NEGATIVE NEGATIVE Final    Comment: (NOTE) SARS-CoV-2 target nucleic acids are NOT DETECTED.  The SARS-CoV-2 RNA is generally detectable in upper respiratory specimens during the acute phase of infection. The lowest concentration of SARS-CoV-2 viral copies this assay can detect is 138 copies/mL. A negative result does not preclude SARS-Cov-2 infection and should not be used as the sole basis for treatment or other patient management decisions. A negative result may occur with  improper specimen collection/handling, submission of specimen other than nasopharyngeal swab, presence of viral mutation(s) within the areas targeted by this assay, and inadequate number of viral copies(<138 copies/mL). A negative result must be combined with clinical observations, patient history, and epidemiological information. The expected result is Negative.  Fact Sheet for Patients:  EntrepreneurPulse.com.au  Fact Sheet for Healthcare Providers:  IncredibleEmployment.be  This test is no t yet approved or cleared by the [place] FDA and  has been authorized for detection and/or diagnosis of SARS-CoV-2 by FDA under an Emergency Use Authorization  (EUA). This EUA will remain  in effect (meaning this test can be used) for the duration of the COVID-19 declaration under Section 564(b)(1) of the Act, 21 U.S.C.section 360bbb-3(b)(1), unless the authorization is terminated  or revoked sooner.       Influenza A by PCR NEGATIVE NEGATIVE Final   Influenza B by PCR NEGATIVE NEGATIVE Final    Comment: (NOTE) The Xpert Xpress SARS-CoV-2/FLU/RSV plus assay is intended as an aid in the diagnosis of influenza from Nasopharyngeal swab specimens and should not be used as a sole basis for treatment. Nasal washings and aspirates are unacceptable for Xpert Xpress SARS-CoV-2/FLU/RSV testing.  Fact Sheet for Patients: EntrepreneurPulse.com.au  Fact Sheet for Healthcare Providers: IncredibleEmployment.be  This test is not yet approved or cleared by the [place] FDA and has been authorized for detection and/or diagnosis of SARS-CoV-2 by FDA under an Emergency Use Authorization (EUA). This EUA will remain in effect (meaning this test can be used) for the duration of the COVID-19 declaration under Section 564(b)(1) of the Act, 21 U.S.C. section 360bbb-3(b)(1), unless the authorization is terminated or revoked.  Performed at [facility], [address] [place], [place] [address]   MRSA Next Gen by PCR, Nasal     Status: None  Collection Time: 03/21/21  9:21 PM   Specimen: Nasal Mucosa; Nasal Swab  Result Value Ref Range Status   MRSA by PCR Next Gen NOT DETECTED NOT DETECTED Final    Comment: (NOTE) The GeneXpert MRSA Assay (FDA approved for NASAL specimens only), is one component of a comprehensive MRSA colonization surveillance program. It is not intended to diagnose MRSA infection nor to guide or monitor treatment for MRSA infections. Test performance is not FDA approved in patients less than 61 years old. Performed at [facility] [place] [address], [place], [place] [address]          Radiology Studies: MR KNEE RIGHT WO CONTRAST  Result Date: 03/24/2021 CLINICAL DATA:  Knee replacement, periprosthetic fracture suspected EXAM: MRI OF THE RIGHT KNEE WITHOUT CONTRAST TECHNIQUE: Multiplanar, multisequence MR imaging of the knee was performed. No intravenous contrast was administered. COMPARISON:  Right knee radiograph 03/21/2021 FINDINGS: Bones/Joint/Cartilage Total knee arthroplasty with associated susceptibility artifact. There is a moderate-sized joint effusion, with low signal intensity internal debris largest measuring up to 8 mm (axial stir image 8-10 as well as coronal stir images 17-19). There is no significant marrow signal abnormality. There is linear low intensity signal on the coronal T1 images extending from the femoral stem proximally, without any associated bony edema signal which suggests this is a chronic finding. There is no evidence of acute fracture. Ligaments Grossly intact. Muscles and Tendons Generalized mild to moderate muscle atrophy. Soft tissues Soft tissue swelling along the knee. IMPRESSION: Total knee arthroplasty with associated susceptibility artifact. Moderate-sized joint effusion with low signal intensity internal debris of unclear origin/etiology, largest measuring up to 8 mm, which could potentially represent gas if recently aspirated, artificial debris, blood products, or potentially bony fragments although this is not visible on prior radiograph. No obvious acute osseous or ligamentous injury given anatomic distortion from susceptibility artifact. Electronically Signed   By: Maurine Simmering M.D.   On: 03/24/2021 08:36        Scheduled Meds:  atenolol  25 mg Oral Daily   Chlorhexidine Gluconate Cloth  6 each Topical Daily   colesevelam  625 mg Oral TID   [START ON 03/25/2021] desvenlafaxine  50 mg Oral Daily   dicyclomine  20 mg Oral TID AC & HS   dorzolamide-timolol  1 drop Both Eyes BID   doxycycline  100  mg Oral Q12H   enoxaparin (LOVENOX) injection  60 mg Subcutaneous Q24H   gabapentin  200 mg Oral BID   guaiFENesin  600 mg Oral BID   insulin aspart  0-15 Units Subcutaneous TID WC   insulin aspart  0-5 Units Subcutaneous QHS   latanoprost  1 drop Right Eye QHS   loratadine  10 mg Oral Daily   mometasone-formoterol  2 puff Inhalation BID   Netarsudil-Latanoprost  1 drop Left Eye QHS   pantoprazole  40 mg Oral Daily   polycarbophil  625 mg Oral Daily   pramipexole  0.5 mg Oral QHS   pravastatin  10 mg Oral q1800   temazepam  15 mg Oral QHS   vitamin B-12  1,000 mcg Oral Daily   Continuous Infusions:  cefTRIAXone (ROCEPHIN)  IV 2 g (03/23/21 1706)     LOS: 2 days     Cordelia Poche, MD [facility] 03/24/2021, 1:06 PM  If 7PM-7AM, please contact night-coverage [email or web address]

## 2021-03-25 DIAGNOSIS — N360 Urethral fistula: Secondary | ICD-10-CM

## 2021-03-25 LAB — GLUCOSE, CAPILLARY
Glucose-Capillary: 196 mg/dL — ABNORMAL HIGH (ref 70–99)
Glucose-Capillary: 240 mg/dL — ABNORMAL HIGH (ref 70–99)
Glucose-Capillary: 268 mg/dL — ABNORMAL HIGH (ref 70–99)
Glucose-Capillary: 245 mg/dL — ABNORMAL HIGH (ref 70–99)

## 2021-03-25 LAB — RESP PANEL BY RT-PCR (FLU A&B, COVID) ARPGX2
Influenza A by PCR: NEGATIVE
Influenza B by PCR: NEGATIVE
SARS Coronavirus 2 by RT PCR: NEGATIVE

## 2021-03-25 NOTE — TOC Progression Note (Signed)
Transition of Care [facility]) - Progression Note   Patient Details  Name: [name] MRN: [id number] Date of Birth: 1941/03/13  Transition of Care [facility]) CM/SW [place], LCSW Phone Number: 03/25/2021, 2:39 PM  Clinical Narrative: CSW notified by Lorenza Chick with [facility] the patient can be admitted tomorrow pending insurance authorization and a negative test. CSW started insurance authorization with Bernadene Bell as the portal was not showing the patient as being admitted. Reference ID # is: [id number]. Patient has been approved for 03/26/2021-03/30/2021. TOC to follow.  Expected Discharge Plan: [place] Barriers to Discharge: Continued Medical Work up  Expected Discharge Plan and Services Expected Discharge Plan: [place] Choice: [place] arrangements for the past 2 months: Single Family Home  Readmission Risk Interventions No flowsheet data found.

## 2021-03-25 NOTE — Progress Notes (Addendum)
PROGRESS NOTE    [name]  CBS:[id number] DOB: Nov 30, 1940 DOA: 03/21/2021 PCP: Marin Olp, MD   Brief Narrative:  [name] is a 80 y.o. female with a history of hypertension, GERD, depression. Patient presented secondary to right knee pain and was found to have evidence of pneumonia with associated hypoxia. Started empirically on Ceftriaxone and azithromycin. Right knee with moderate-large effusion; no fracture on x-ray imaging.      Assessment & Plan:   Acute respiratory failure with hypoxia (HCC) -Likely secondary to pneumonia as below -Continue to wean oxygen off as tolerated   Effusion of right knee -Appears to be traumatic given recent fall  -Orthopedic following, status post aspiration with 50 cc of bloody fluid, MRI without significant fracture, no further work-up per orthopedics -Outpatient follow-up in 2 to 4 weeks with orthopedic surgery to further evaluate  Pneumonia, POA, does not meet sepsis criteria -Continue ceftriaxone doxycycline x5 days    Recurrent major depression in full remission ([place]) -Resume home Pristiq (patient to use her home stock) -Continue Restoril QHS   Obesity (BMI 30-39.9) -Body mass index is 36.56 kg/m.   DVT prophylaxis: Lovenox Code Status:   Code Status: Full Code Family Communication: Husband at bedside  Status is: Inpatient  Dispo: The patient is from: Home              Anticipated d/c is to: SNF               Anticipated d/c date is: 24 to 48 hours              Patient currently not medically stable for discharge  Consultants:  Orthopedic surgery  Procedures:  Right knee aspiration, bloody aspirate  Antimicrobials:  Ceftriaxone doxycycline x5 days  Subjective: Is no acute issues or events overnight ambulating short distances yesterday, otherwise without overt dyspnea chest pain nausea vomiting headache fevers chills shortness of breath  Objective: Vitals:   03/24/21 1200 03/24/21 2049 03/24/21  2102 03/25/21 0600  BP:   (!) 165/58 (!) 150/66  Pulse:   80 79  Resp:   16 16  Temp:   98.4 F (36.9 C) 98.1 F (36.7 C)  TempSrc:   Oral Oral  SpO2: 90% 91% 94% 93%  Weight:      Height:        Intake/Output Summary (Last 24 hours) at 03/25/2021 0723 Last data filed at 03/25/2021 0601 Gross per 24 hour  Intake 600 ml  Output 2100 ml  Net -1500 ml   Filed Weights   03/21/21 0923  Weight: 122.3 kg    Examination:  General:  Pleasantly resting in bed, No acute distress. HEENT:  Normocephalic atraumatic.  Sclerae nonicteric, noninjected.  Extraocular movements intact bilaterally. Neck:  Without mass or deformity.  Trachea is midline. Lungs:  Clear to auscultate bilaterally without rhonchi, wheeze, or rales. Heart:  Regular rate and rhythm.  Without murmurs, rubs, or gallops. Abdomen:  Soft, nontender, obese, nondistended.  Without guarding or rebound. Extremities: Without cyanosis, clubbing, edema, right knee effusion, mild to moderate Vascular:  Dorsalis pedis and posterior tibial pulses palpable bilaterally. Skin:  Warm and dry, no erythema, no ulcerations.   Data Reviewed: I have personally reviewed following labs and imaging studies  CBC: Recent Labs  Lab 03/21/21 0938 03/21/21 1011 03/21/21 1849 03/22/21 0336  WBC 11.1*  --  9.0 8.4  HGB 11.4* 11.9* 11.0* 10.6*  HCT 36.9 35.0* 34.4* 33.9*  MCV 94.1  --  93.5 95.0  PLT 240  --  208 433   Basic Metabolic Panel: Recent Labs  Lab 03/21/21 0938 03/21/21 1011 03/22/21 0336  NA 136 135 140  K 3.8 3.7 3.6  CL 95*  --  102  CO2 32  --  34*  GLUCOSE 181*  --  163*  BUN 24*  --  16  CREATININE 1.37*  --  0.74  CALCIUM 8.7*  --  8.2*   GFR: Estimated Creatinine Clearance: 80.8 mL/min (by C-G formula based on SCr of 0.74 mg/dL). Liver Function Tests: Recent Labs  Lab 03/22/21 0336  AST 13*  ALT 12  ALKPHOS 68  BILITOT 0.6  PROT 5.8*  ALBUMIN 2.9*   No results for input(s): LIPASE, AMYLASE in the  last 168 hours. No results for input(s): AMMONIA in the last 168 hours. Coagulation Profile: No results for input(s): INR, PROTIME in the last 168 hours. Cardiac Enzymes: No results for input(s): CKTOTAL, CKMB, CKMBINDEX, TROPONINI in the last 168 hours. BNP (last 3 results) Recent Labs    10/29/20 1452  PROBNP 17.0   HbA1C: No results for input(s): HGBA1C in the last 72 hours. CBG: Recent Labs  Lab 03/23/21 2140 03/24/21 0747 03/24/21 1139 03/24/21 1619 03/24/21 2106  GLUCAP 241* 218* 207* 254* 241*   Lipid Profile: No results for input(s): CHOL, HDL, LDLCALC, TRIG, CHOLHDL, LDLDIRECT in the last 72 hours. Thyroid Function Tests: No results for input(s): TSH, T4TOTAL, FREET4, T3FREE, THYROIDAB in the last 72 hours. Anemia Panel: No results for input(s): VITAMINB12, FOLATE, FERRITIN, TIBC, IRON, RETICCTPCT in the last 72 hours. Sepsis Labs: Recent Labs  Lab 03/21/21 1623 03/21/21 1849  LATICACIDVEN 1.6 1.3    Recent Results (from the past 240 hour(s))  Resp Panel by RT-PCR (Flu A&B, Covid) Nasopharyngeal Swab     Status: None   Collection Time: 03/21/21  9:38 AM   Specimen: Nasopharyngeal Swab; Nasopharyngeal(NP) swabs in vial transport medium  Result Value Ref Range Status   SARS Coronavirus 2 by RT PCR NEGATIVE NEGATIVE Final    Comment: (NOTE) SARS-CoV-2 target nucleic acids are NOT DETECTED.  The SARS-CoV-2 RNA is generally detectable in upper respiratory specimens during the acute phase of infection. The lowest concentration of SARS-CoV-2 viral copies this assay can detect is 138 copies/mL. A negative result does not preclude SARS-Cov-2 infection and should not be used as the sole basis for treatment or other patient management decisions. A negative result may occur with  improper specimen collection/handling, submission of specimen other than nasopharyngeal swab, presence of viral mutation(s) within the areas targeted by this assay, and inadequate number  of viral copies(<138 copies/mL). A negative result must be combined with clinical observations, patient history, and epidemiological information. The expected result is Negative.  Fact Sheet for Patients:  EntrepreneurPulse.com.au  Fact Sheet for Healthcare Providers:  IncredibleEmployment.be  This test is no t yet approved or cleared by the [place] FDA and  has been authorized for detection and/or diagnosis of SARS-CoV-2 by FDA under an Emergency Use Authorization (EUA). This EUA will remain  in effect (meaning this test can be used) for the duration of the COVID-19 declaration under Section 564(b)(1) of the Act, 21 U.S.C.section 360bbb-3(b)(1), unless the authorization is terminated  or revoked sooner.       Influenza A by PCR NEGATIVE NEGATIVE Final   Influenza B by PCR NEGATIVE NEGATIVE Final    Comment: (NOTE) The Xpert Xpress SARS-CoV-2/FLU/RSV plus assay is intended as an aid in the  diagnosis of influenza from Nasopharyngeal swab specimens and should not be used as a sole basis for treatment. Nasal washings and aspirates are unacceptable for Xpert Xpress SARS-CoV-2/FLU/RSV testing.  Fact Sheet for Patients: EntrepreneurPulse.com.au  Fact Sheet for Healthcare Providers: IncredibleEmployment.be  This test is not yet approved or cleared by the [place] FDA and has been authorized for detection and/or diagnosis of SARS-CoV-2 by FDA under an Emergency Use Authorization (EUA). This EUA will remain in effect (meaning this test can be used) for the duration of the COVID-19 declaration under Section 564(b)(1) of the Act, 21 U.S.C. section 360bbb-3(b)(1), unless the authorization is terminated or revoked.  Performed at [facility], [address] [place], [place] [address]   MRSA Next Gen by PCR, Nasal     Status: None   Collection Time: 03/21/21  9:21 PM   Specimen:  Nasal Mucosa; Nasal Swab  Result Value Ref Range Status   MRSA by PCR Next Gen NOT DETECTED NOT DETECTED Final    Comment: (NOTE) The GeneXpert MRSA Assay (FDA approved for NASAL specimens only), is one component of a comprehensive MRSA colonization surveillance program. It is not intended to diagnose MRSA infection nor to guide or monitor treatment for MRSA infections. Test performance is not FDA approved in patients less than 76 years old. Performed at [facility], [place] [address] [place], [place] [address]          Radiology Studies: MR KNEE RIGHT WO CONTRAST  Result Date: 03/24/2021 CLINICAL DATA:  Knee replacement, periprosthetic fracture suspected EXAM: MRI OF THE RIGHT KNEE WITHOUT CONTRAST TECHNIQUE: Multiplanar, multisequence MR imaging of the knee was performed. No intravenous contrast was administered. COMPARISON:  Right knee radiograph 03/21/2021 FINDINGS: Bones/Joint/Cartilage Total knee arthroplasty with associated susceptibility artifact. There is a moderate-sized joint effusion, with low signal intensity internal debris largest measuring up to 8 mm (axial stir image 8-10 as well as coronal stir images 17-19). There is no significant marrow signal abnormality. There is linear low intensity signal on the coronal T1 images extending from the femoral stem proximally, without any associated bony edema signal which suggests this is a chronic finding. There is no evidence of acute fracture. Ligaments Grossly intact. Muscles and Tendons Generalized mild to moderate muscle atrophy. Soft tissues Soft tissue swelling along the knee. IMPRESSION: Total knee arthroplasty with associated susceptibility artifact. Moderate-sized joint effusion with low signal intensity internal debris of unclear origin/etiology, largest measuring up to 8 mm, which could potentially represent gas if recently aspirated, artificial debris, blood products, or potentially bony fragments although  this is not visible on prior radiograph. No obvious acute osseous or ligamentous injury given anatomic distortion from susceptibility artifact. Electronically Signed   By: Maurine Simmering M.D.   On: 03/24/2021 08:36        Scheduled Meds:  atenolol  25 mg Oral Daily   Chlorhexidine Gluconate Cloth  6 each Topical Daily   colesevelam  625 mg Oral TID   desvenlafaxine  50 mg Oral Daily   dicyclomine  20 mg Oral TID AC & HS   dorzolamide-timolol  1 drop Both Eyes BID   doxycycline  100 mg Oral Q12H   enoxaparin (LOVENOX) injection  60 mg Subcutaneous Q24H   gabapentin  200 mg Oral BID   guaiFENesin  600 mg Oral BID   insulin aspart  0-15 Units Subcutaneous TID WC   insulin aspart  0-5 Units Subcutaneous QHS   latanoprost  1 drop Right Eye QHS  loratadine  10 mg Oral Daily   mometasone-formoterol  2 puff Inhalation BID   Netarsudil-Latanoprost  1 drop Left Eye QHS   pantoprazole  40 mg Oral Daily   polycarbophil  625 mg Oral Daily   pramipexole  0.5 mg Oral QHS   pravastatin  10 mg Oral q1800   temazepam  15 mg Oral QHS   vitamin B-12  1,000 mcg Oral Daily   Continuous Infusions:  cefTRIAXone (ROCEPHIN)  IV 2 g (03/24/21 1742)     LOS: 3 days   Time spent: 72min  Jabir Dahlem C Jimena Wieczorek, DO [facility]  If 7PM-7AM, please contact night-coverage [email or web address]  03/25/2021, 7:23 AM

## 2021-03-25 NOTE — Progress Notes (Signed)
Patient ID: [name], female   DOB: 05/15/1940, 80 y.o.   MRN: [id number] Subjective:      She feels like she os improving. Some concerns for swelling at aspiration site  Objective:   VITALS:   Vitals:   03/24/21 2102 03/25/21 0600  BP: (!) 165/58 (!) 150/66  Pulse: 80 79  Resp: 16 16  Temp: 98.4 F (36.9 C) 98.1 F (36.7 C)  SpO2: 94% 93%    Neurovascular intact At aspiration site there is some minor swelling but no erythema  LABS No results for input(s): HGB, HCT, WBC, PLT in the last 72 hours.  No results for input(s): NA, K, BUN, CREATININE, GLUCOSE in the last 72 hours.  No results for input(s): LABPT, INR in the last 72 hours.   Imaging: CLINICAL DATA:  Knee replacement, periprosthetic fracture suspected   EXAM: MRI OF THE RIGHT KNEE WITHOUT CONTRAST   TECHNIQUE: Multiplanar, multisequence MR imaging of the knee was performed. No intravenous contrast was administered.   COMPARISON:  Right knee radiograph 03/21/2021   FINDINGS: Bones/Joint/Cartilage   Total knee arthroplasty with associated susceptibility artifact. There is a moderate-sized joint effusion, with low signal intensity internal debris largest measuring up to 8 mm (axial stir image 8-10 as well as coronal stir images 17-19). There is no significant marrow signal abnormality. There is linear low intensity signal on the coronal T1 images extending from the femoral stem proximally, without any associated bony edema signal which suggests this is a chronic finding. There is no evidence of acute fracture.   Ligaments   Grossly intact.   Muscles and Tendons   Generalized mild to moderate muscle atrophy.   Soft tissues   Soft tissue swelling along the knee.   IMPRESSION: Total knee arthroplasty with associated susceptibility artifact. Moderate-sized joint effusion with low signal intensity internal debris of unclear origin/etiology, largest measuring up to 8 mm, which could  potentially represent gas if recently aspirated, artificial debris, blood products, or potentially bony fragments although this is not visible on prior radiograph. No obvious acute osseous or ligamentous injury given anatomic distortion from susceptibility artifact.     Electronically Signed   By: Maurine Simmering M.D.   Assessment/Plan:    History of right total knee replacement with hemarthrosis without obvious radiographic findings for fracture or other abnormality  Up with therapy  Given radiographic evaluation she can be WBAT on this RLE Follow up with Aluisio or myself in 2-4 weeks Ice to aspiration site for swelling

## 2021-03-25 NOTE — Progress Notes (Signed)
Occupational Therapy Treatment Patient Details Name: [name] MRN: [id number] DOB: 1940/06/30 Today's Date: 03/25/2021   History of present illness Pt admitted from home s/p fall with R knee pain (R knee clear on imaging) weakness and inability to mobilize at home.  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   OT comments  Patient supine in bed when therapist entered room with sat 91% on RA. She reports being out of bed for toileting only today. With encouragement patient agreeable to treatment. Patient supervision for transfer to side of bed and min guard to stand and ambulate 12 feet in room with RW. No overt loss of balance or complaints of pain. Patient's o2 sat dropped to 87% with bed transfer and with short ambulation. Recovered on RA with deep breathing. Therapist encouraged and educated on upright seated position and use of incentive spirometer. Patient verbalized understanding. Cont to recommend short term rehab at discharge.   Recommendations for follow up therapy are one component of a multi-disciplinary discharge planning process, led by the attending physician.  Recommendations may be updated based on patient status, additional functional criteria and insurance authorization.    Follow Up Recommendations  Skilled nursing-short term rehab (<3 hours/day)    Assistance Recommended at Discharge Frequent or constant Supervision/Assistance  Equipment Recommendations   (efer to next venue)    Recommendations for Other Services      Precautions / Restrictions Precautions Precautions: Fall Restrictions Weight Bearing Restrictions: No       Mobility Bed Mobility Overal bed mobility: Needs Assistance Bed Mobility: Supine to Sit     Supine to sit: Supervision;HOB elevated          Transfers Overall transfer level: Needs  assistance Equipment used: Rolling walker (2 wheels) Transfers: Sit to/from Stand Sit to Stand: Min guard           General transfer comment: MIn guard to stand with RW and ambualte 12 feet in room. Positioned in recliner after ambulation.     Balance Overall balance assessment: Mild deficits observed, not formally tested                                         ADL either performed or assessed with clinical judgement   ADL                                              Extremity/Trunk Assessment              Vision Patient Visual Report: No change from baseline     Perception     Praxis      Cognition Arousal/Alertness: Awake/alert Behavior During Therapy: WFL for tasks assessed/performed Overall Cognitive Status: Within Functional Limits for tasks assessed                                            Exercises     Shoulder Instructions       General Comments      Pertinent Vitals/ Pain  Pain Assessment: No/denies pain  Home Living                                          Prior Functioning/Environment              Frequency  Min 2X/week        Progress Toward Goals  OT Goals(current goals can now be found in the care plan section)  Progress towards OT goals: Progressing toward goals  Acute Rehab OT Goals Patient Stated Goal: to get back home with husband OT Goal Formulation: With patient Time For Goal Achievement: 04/05/21 Potential to Achieve Goals: Good  Plan Discharge plan remains appropriate    Co-evaluation          OT goals addressed during session:  (functional mobility, education)      AM-PAC OT "6 Clicks" Daily Activity     Outcome Measure   Help from another person eating meals?: A Little Help from another person taking care of personal grooming?: A Little Help from another person toileting, which includes using toliet, bedpan, or urinal?:  A Lot Help from another person bathing (including washing, rinsing, drying)?: A Lot Help from another person to put on and taking off regular upper body clothing?: A Little Help from another person to put on and taking off regular lower body clothing?: A Lot 6 Click Score: 15    End of Session Equipment Utilized During Treatment: Rolling walker (2 wheels)  OT Visit Diagnosis: Unsteadiness on feet (R26.81);Muscle weakness (generalized) (M62.81)   Activity Tolerance Patient tolerated treatment well   Patient Left in chair;with call bell/phone within reach;with chair alarm set;with family/visitor present   Nurse Communication Mobility status (o2 sats)        Time: 6160-7371 OT Time Calculation (min): 21 min  Charges: OT General Charges $OT Visit: 1 Visit OT Treatments $Therapeutic Activity: 8-22 mins  Tariana Moldovan, OTR/L [place]  Office [phone] Pager: [place] 03/25/2021, 4:12 PM

## 2021-03-26 DIAGNOSIS — G4700 Insomnia, unspecified: Secondary | ICD-10-CM | POA: Diagnosis not present

## 2021-03-26 DIAGNOSIS — R251 Tremor, unspecified: Secondary | ICD-10-CM | POA: Diagnosis not present

## 2021-03-26 DIAGNOSIS — F339 Major depressive disorder, recurrent, unspecified: Secondary | ICD-10-CM | POA: Diagnosis not present

## 2021-03-26 DIAGNOSIS — Z20828 Contact with and (suspected) exposure to other viral communicable diseases: Secondary | ICD-10-CM | POA: Diagnosis not present

## 2021-03-26 DIAGNOSIS — R278 Other lack of coordination: Secondary | ICD-10-CM | POA: Diagnosis not present

## 2021-03-26 DIAGNOSIS — N390 Urinary tract infection, site not specified: Secondary | ICD-10-CM | POA: Diagnosis not present

## 2021-03-26 DIAGNOSIS — E1140 Type 2 diabetes mellitus with diabetic neuropathy, unspecified: Secondary | ICD-10-CM | POA: Diagnosis not present

## 2021-03-26 DIAGNOSIS — J479 Bronchiectasis, uncomplicated: Secondary | ICD-10-CM | POA: Diagnosis not present

## 2021-03-26 DIAGNOSIS — M6281 Muscle weakness (generalized): Secondary | ICD-10-CM | POA: Diagnosis not present

## 2021-03-26 DIAGNOSIS — I959 Hypotension, unspecified: Secondary | ICD-10-CM | POA: Diagnosis not present

## 2021-03-26 DIAGNOSIS — M25461 Effusion, right knee: Secondary | ICD-10-CM | POA: Diagnosis not present

## 2021-03-26 DIAGNOSIS — Z794 Long term (current) use of insulin: Secondary | ICD-10-CM | POA: Diagnosis not present

## 2021-03-26 DIAGNOSIS — J9690 Respiratory failure, unspecified, unspecified whether with hypoxia or hypercapnia: Secondary | ICD-10-CM | POA: Diagnosis not present

## 2021-03-26 DIAGNOSIS — M25469 Effusion, unspecified knee: Secondary | ICD-10-CM | POA: Diagnosis not present

## 2021-03-26 DIAGNOSIS — E669 Obesity, unspecified: Secondary | ICD-10-CM | POA: Diagnosis not present

## 2021-03-26 DIAGNOSIS — J45909 Unspecified asthma, uncomplicated: Secondary | ICD-10-CM | POA: Diagnosis not present

## 2021-03-26 DIAGNOSIS — E118 Type 2 diabetes mellitus with unspecified complications: Secondary | ICD-10-CM | POA: Diagnosis not present

## 2021-03-26 DIAGNOSIS — R262 Difficulty in walking, not elsewhere classified: Secondary | ICD-10-CM | POA: Diagnosis not present

## 2021-03-26 DIAGNOSIS — R0902 Hypoxemia: Secondary | ICD-10-CM | POA: Diagnosis not present

## 2021-03-26 DIAGNOSIS — J189 Pneumonia, unspecified organism: Secondary | ICD-10-CM | POA: Diagnosis not present

## 2021-03-26 DIAGNOSIS — T8189XA Other complications of procedures, not elsewhere classified, initial encounter: Secondary | ICD-10-CM | POA: Diagnosis not present

## 2021-03-26 DIAGNOSIS — Z9981 Dependence on supplemental oxygen: Secondary | ICD-10-CM | POA: Diagnosis not present

## 2021-03-26 DIAGNOSIS — I152 Hypertension secondary to endocrine disorders: Secondary | ICD-10-CM | POA: Diagnosis not present

## 2021-03-26 DIAGNOSIS — R1110 Vomiting, unspecified: Secondary | ICD-10-CM | POA: Diagnosis not present

## 2021-03-26 DIAGNOSIS — E0590 Thyrotoxicosis, unspecified without thyrotoxic crisis or storm: Secondary | ICD-10-CM | POA: Diagnosis not present

## 2021-03-26 DIAGNOSIS — J9691 Respiratory failure, unspecified with hypoxia: Secondary | ICD-10-CM | POA: Diagnosis not present

## 2021-03-26 DIAGNOSIS — R0602 Shortness of breath: Secondary | ICD-10-CM | POA: Diagnosis not present

## 2021-03-26 DIAGNOSIS — I10 Essential (primary) hypertension: Secondary | ICD-10-CM | POA: Diagnosis not present

## 2021-03-26 DIAGNOSIS — D649 Anemia, unspecified: Secondary | ICD-10-CM | POA: Diagnosis not present

## 2021-03-26 DIAGNOSIS — R1111 Vomiting without nausea: Secondary | ICD-10-CM | POA: Diagnosis not present

## 2021-03-26 DIAGNOSIS — W19XXXA Unspecified fall, initial encounter: Secondary | ICD-10-CM | POA: Diagnosis not present

## 2021-03-26 DIAGNOSIS — E119 Type 2 diabetes mellitus without complications: Secondary | ICD-10-CM | POA: Diagnosis not present

## 2021-03-26 DIAGNOSIS — Z7401 Bed confinement status: Secondary | ICD-10-CM | POA: Diagnosis not present

## 2021-03-26 DIAGNOSIS — N823 Fistula of vagina to large intestine: Secondary | ICD-10-CM | POA: Diagnosis not present

## 2021-03-26 DIAGNOSIS — K580 Irritable bowel syndrome with diarrhea: Secondary | ICD-10-CM | POA: Diagnosis not present

## 2021-03-26 DIAGNOSIS — J9601 Acute respiratory failure with hypoxia: Secondary | ICD-10-CM | POA: Diagnosis not present

## 2021-03-26 DIAGNOSIS — E0841 Diabetes mellitus due to underlying condition with diabetic mononeuropathy: Secondary | ICD-10-CM | POA: Diagnosis not present

## 2021-03-26 LAB — GLUCOSE, CAPILLARY
Glucose-Capillary: 240 mg/dL — ABNORMAL HIGH (ref 70–99)
Glucose-Capillary: 238 mg/dL — ABNORMAL HIGH (ref 70–99)

## 2021-03-26 MED ORDER — ADULT MULTIVITAMIN W/MINERALS CH: 1.0000 | ORAL_TABLET | Freq: Every day | ORAL | 0 refills | Status: AC

## 2021-03-26 NOTE — Plan of Care (Signed)
  Problem: Activity: Goal: Risk for activity intolerance will decrease Outcome: Progressing   Problem: Pain Managment: Goal: General experience of comfort will improve Outcome: Progressing   Problem: Safety: Goal: Ability to remain free from injury will improve Outcome: Progressing   

## 2021-03-26 NOTE — Progress Notes (Signed)
[facility] place and report given to nurse [facility].

## 2021-03-26 NOTE — TOC Transition Note (Signed)
Transition of Care [facility]) - CM/SW Discharge Note  Patient Details  Name: [name] MRN: [id number] Date of Birth: 03-Nov-1940  [facility]) CM/SW Contact:  Sherie Don, LCSW Phone Number: 03/26/2021, 11:34 AM  Clinical Narrative: Patient's COVID test is negative. Patient can discharge to [facility] today. Discharge summary, discharge orders, and SNF transfer report faxed to facility in hub. Patient will go to room 702P and the number for report is [phone].  Medical necessity form done; PTAR scheduled. CSW notified patient and husband of discharge. RN updated. TOC signing off.  Final next level of care: Skilled Nursing Facility Barriers to Discharge: Barriers Resolved  Patient Goals and CMS Choice Patient states their goals for this hospitalization and ongoing recovery are:: To go to SNF for short term rehab, then return back home. CMS Medicare.gov Compare Post Acute Care list provided to:: Patient Represenative (must comment) Choice offered to / list presented to : Spouse  Discharge Placement PASRR number recieved: 03/23/21        Patient chooses bed at: [facility] Patient to be transferred to facility by: [place] Name of family member notified: Lilah Mijangos (husband) Patient and family notified of of transfer: 03/26/21  Discharge Plan and Services Post Acute Care Choice: [place]           Readmission Risk Interventions No flowsheet data found.

## 2021-03-26 NOTE — Discharge Summary (Addendum)
Physician Discharge Summary  [name] UKG:[id number] DOB: Dec 03, 1940 DOA: 03/21/2021  PCP: Marin Olp, MD  Admit date: 03/21/2021 Discharge date: 03/26/2021  Admitted From: Home Disposition:  SNF  Recommendations for Outpatient Follow-up:  Follow up with PCP in 1-2 weeks Please obtain BMP/CBC in one week Please follow up with orthopedic surgery as scheduled in 2-4 weeks  Discharge Condition:Stable  CODE STATUS:Full  Diet recommendation: Diabetic diet    Brief/Interim Summary: [name] is a 80 y.o. female with a history of hypertension, GERD, depression. Patient presented secondary to right knee pain and was found to have evidence of pneumonia with associated hypoxia. Started empirically on Ceftriaxone and azithromycin. Right knee with moderate-large effusion; no fracture on x-ray imaging.  Patient on continuous Bactrim for UTI prophylaxis in the outpatient setting, will continue this medication at discharge.  Assessment & Plan:   Acute respiratory failure with hypoxia secondary to pneumonia - wean oxygen as tolerated - completed abx course in the hospital   Effusion of right knee -Appears to be traumatic given recent fall  -Orthopedic following, status post aspiration with 50 cc of bloody fluid, MRI without significant fracture, no further work-up per orthopedics -Outpatient follow-up in 2 to 4 weeks with orthopedic surgery to further evaluate  Recurrent major depression in full remission ([place]) -Resume home Pristiq (patient to use her home stock) -Continue Restoril QHS   Obesity (BMI 30-39.9) -Body mass index is 36.56 kg/m.  Discharge Instructions   Allergies as of 03/26/2021       Reactions   Nitrofurantoin Other (See Comments)   Severe headache HEADACHE   Levaquin [levofloxacin In D5w] Other (See Comments)   Pain in tendons    Brimonidine Other (See Comments)   Made eyes and surrounding areas RED/ was an eye drop   Cephalexin Diarrhea,  Other (See Comments)   Patient can't remember reaction (per chart at [facility] states diarrhea) (tolerates Augmentin fine)   Erythromycin Ethylsuccinate Hives, Diarrhea   Oxycodone Itching        Medication List     STOP taking these medications    amoxicillin-clavulanate 875-125 MG tablet Commonly known as: AUGMENTIN   fluconazole 150 MG tablet Commonly known as: DIFLUCAN       TAKE these medications    Accu-Chek FastClix Lancet Kit Use to test blood sugars daily. Dx: E11.9   Accu-Chek Guide test strip Generic drug: glucose blood USE 1 TO TEST UP TO TWICE DAILY   Accu-Chek Guide test strip Generic drug: glucose blood USE 1 ONCE DAILY TO TEST BLOOD SUGAR   ALIGN PREBIOTIC-PROBIOTIC PO Take 1 capsule by mouth daily.   atenolol 25 MG tablet Commonly known as: TENORMIN Take 1 tablet by mouth once daily   blood glucose meter kit and supplies Kit Dispense based on patient and insurance preference. Use up to four times daily as directed. Dx E11.9   budesonide-formoterol 80-4.5 MCG/ACT inhaler Commonly known as: SYMBICORT Inhale 2 puffs into the lungs 2 (two) times daily.   cetirizine 10 MG tablet Commonly known as: ZYRTEC Take 10 mg by mouth every morning.   colesevelam 625 MG tablet Commonly known as: WELCHOL TAKE 1 TABLET BY MOUTH ONCE DAILY IN THE MORNING AND AT NOON AND AT BEDTIME What changed:  how much to take how to take this when to take this additional instructions   CoQ10 200 MG Caps Take 200 mg by mouth daily.   desvenlafaxine 50 MG 24 hr tablet Commonly known as: Pristiq Take 1  tablet (50 mg total) by mouth daily.   dicyclomine 20 MG tablet Commonly known as: BENTYL Take 1 tablet (20 mg total) by mouth in the morning, at noon, in the evening, and at bedtime.   dorzolamide-timolol 22.3-6.8 MG/ML ophthalmic solution Commonly known as: COSOPT Place 1 drop into both eyes 2 (two) times daily.   Estring 2 MG vaginal ring Generic drug:  estradiol INSERT 1 RING  INTO VAGINA EVERY 3 MONTHS   FIBER PO Take 2 tablets by mouth every evening.   Flutter Devi Use as directed   gabapentin 100 MG capsule Commonly known as: NEURONTIN TAKE 1 CAPSULE BY MOUTH THREE TIMES DAILY AS NEEDED FOR  NEUROPATHIC  PAIN. What changed:  how much to take how to take this when to take this additional instructions   glimepiride 4 MG tablet Commonly known as: AMARYL TAKE 2 TABLETS BY MOUTH ONCE DAILY WITH BREAKFAST   HYDROcodone-acetaminophen 5-325 MG tablet Commonly known as: Norco Take 1-2 tablets by mouth every 6 (six) hours as needed for moderate pain or severe pain.   insulin degludec 200 UNIT/ML FlexTouch Pen Commonly known as: TRESIBA Inject 60-80 Units into the skin in the morning. What changed: how much to take   latanoprost 0.005 % ophthalmic solution Commonly known as: XALATAN Place 1 drop into the right eye at bedtime.   lisinopril 10 MG tablet Commonly known as: ZESTRIL Take 1 tablet (10 mg total) by mouth daily.   lovastatin 10 MG tablet Commonly known as: MEVACOR Take 1 tablet (10 mg total) by mouth daily.   multivitamin with minerals Tabs tablet Take 1 tablet by mouth daily. 3 Fruit Capsules 3 Vegetable Capsules - Balance of Nature What changed:  how much to take when to take this   NEURIVA PO Take 1 capsule by mouth daily.   NovoFine Plus Pen Needle 32G X 4 MM Misc Generic drug: Insulin Pen Needle 1 each by Does not apply route daily.   pantoprazole 40 MG tablet Commonly known as: Protonix Take 1 tablet (40 mg total) by mouth daily. Take 1 hour around food.   pramipexole 0.5 MG tablet Commonly known as: MIRAPEX TAKE 1 TABLET BY MOUTH AT BEDTIME What changed: when to take this   Rocklatan 0.02-0.005 % Soln Generic drug: Netarsudil-Latanoprost Place 1 drop into the left eye at bedtime.   Selenium 200 MCG Caps Take 200 mcg by mouth at bedtime.   sulfamethoxazole-trimethoprim 400-80 MG  tablet Commonly known as: BACTRIM Take 1 tablet by mouth daily.   temazepam 15 MG capsule Commonly known as: RESTORIL TAKE 1 CAPSULE BY MOUTH AT BEDTIME AS NEEDED FOR SLEEP What changed: See the new instructions.   UNABLE TO FIND Take 2 tablets by mouth at bedtime. Fiber therapy   vitamin B-12 1000 MCG tablet Commonly known as: CYANOCOBALAMIN Take 1,000 mcg by mouth daily.   Vitamin D3 125 MCG (5000 UT) Tabs Take 5,000 Units by mouth daily.        Allergies  Allergen Reactions   Nitrofurantoin Other (See Comments)    Severe headache HEADACHE   Levaquin [Levofloxacin In D5w] Other (See Comments)    Pain in tendons    Brimonidine Other (See Comments)    Made eyes and surrounding areas RED/ was an eye drop   Cephalexin Diarrhea and Other (See Comments)    Patient can't remember reaction (per chart at [facility] states diarrhea) (tolerates Augmentin fine)   Erythromycin Ethylsuccinate Hives and Diarrhea   Oxycodone Itching  Consultations: Orthopedic surgery, gen surgery  Procedures/Studies: CT Angio Chest PE W and/or Wo Contrast  Result Date: 03/21/2021 CLINICAL DATA:  Shortness of breath, hypoxia EXAM: CT ANGIOGRAPHY CHEST WITH CONTRAST TECHNIQUE: Multidetector CT imaging of the chest was performed using the standard protocol during bolus administration of intravenous contrast. Multiplanar CT image reconstructions and MIPs were obtained to evaluate the vascular anatomy. CONTRAST:  20m OMNIPAQUE IOHEXOL 350 MG/ML SOLN COMPARISON:  01/26/2017 FINDINGS: Cardiovascular: Scattered coronary artery calcifications are seen. There is homogeneous enhancement in thoracic aorta. Are no intraluminal filling defects in the central pulmonary artery branches. Evaluation of peripheral branches is limited by motion artifacts. Mediastinum/Nodes: No significant lymphadenopathy seen. There is inhomogeneous attenuation in the thyroid with possible low-density nodules. Lungs/Pleura: Small patchy  infiltrates are seen in the posterior lower lung fields. There is no focal pulmonary consolidation. There is no significant pleural effusion or pneumothorax. Upper Abdomen: There are low-density foci in the liver which are not fully evaluated. This finding was not seen in the previous CT done on 01/26/2017. Surgical clips are seen in gallbladder fossa. Musculoskeletal: Unremarkable. Review of the MIP images confirms the above findings. IMPRESSION: There is no evidence of central pulmonary artery embolism. Evaluation of small peripheral branches is limited by motion artifacts, especially in the lower lung fields. There are new small patchy infiltrates in the lower lung fields suggesting subsegmental atelectasis/pneumonitis. There is no pleural effusion or pneumothorax. There are scattered coronary artery calcifications. There are multiple low-density foci of varying sizes in the liver. This may suggest scarring from previous intervention or inflammatory or neoplastic process. Follow-up multiphasic CT when the patient's clinical condition permits should be considered. Electronically Signed   By: PElmer PickerM.D.   On: 03/21/2021 12:03   [place]RENAL  Result Date: 03/21/2021 CLINICAL DATA:  Acute renal insufficiency EXAM: RENAL / URINARY TRACT ULTRASOUND COMPLETE COMPARISON:  None. FINDINGS: Right Kidney: Renal measurements: 12.1 x 6.0 x 6.5 cm = volume: 246 mL. Imaging is slightly limited by the depth of the structure as well as overlying bowel gas which obscures the upper and lower poles of the right kidney partially. Echogenicity within normal limits. No mass or hydronephrosis visualized. Left Kidney: Renal measurements: 12.1 x 7.3 x 5.8 cm = volume: 270 mL. Similarly, imaging of the left kidney is slightly limited by the depth of the structure and overlying bowel gas which slightly limits evaluation of the upper and lower pole. Echogenicity within normal limits. No mass or hydronephrosis visualized.  Bladder: Decompressed with a Foley catheter balloon seen within its lumen. Other: None. IMPRESSION: Slightly limited but unremarkable renal sonogram. Electronically Signed   By: AFidela SalisburyM.D.   On: 03/21/2021 19:10   MR KNEE RIGHT WO CONTRAST  Result Date: 03/24/2021 CLINICAL DATA:  Knee replacement, periprosthetic fracture suspected EXAM: MRI OF THE RIGHT KNEE WITHOUT CONTRAST TECHNIQUE: Multiplanar, multisequence MR imaging of the knee was performed. No intravenous contrast was administered. COMPARISON:  Right knee radiograph 03/21/2021 FINDINGS: Bones/Joint/Cartilage Total knee arthroplasty with associated susceptibility artifact. There is a moderate-sized joint effusion, with low signal intensity internal debris largest measuring up to 8 mm (axial stir image 8-10 as well as coronal stir images 17-19). There is no significant marrow signal abnormality. There is linear low intensity signal on the coronal T1 images extending from the femoral stem proximally, without any associated bony edema signal which suggests this is a chronic finding. There is no evidence of acute fracture. Ligaments Grossly intact. Muscles and Tendons Generalized mild  to moderate muscle atrophy. Soft tissues Soft tissue swelling along the knee. IMPRESSION: Total knee arthroplasty with associated susceptibility artifact. Moderate-sized joint effusion with low signal intensity internal debris of unclear origin/etiology, largest measuring up to 8 mm, which could potentially represent gas if recently aspirated, artificial debris, blood products, or potentially bony fragments although this is not visible on prior radiograph. No obvious acute osseous or ligamentous injury given anatomic distortion from susceptibility artifact. Electronically Signed   By: Maurine Simmering M.D.   On: 03/24/2021 08:36   DG Chest Port 1 View  Result Date: 03/21/2021 CLINICAL DATA:  Pain after fall.  History of hip replacement EXAM: PORTABLE CHEST 1 VIEW  COMPARISON:  04/11/2018 FINDINGS: Normal heart size and mediastinal contours. Mild streaky density at the left base. No edema, effusion, or pneumothorax. Normal heart size. Mild aortic tortuosity. No effusion or pneumothorax. No acute osseous findings. IMPRESSION: Mild atelectasis. Electronically Signed   By: Jorje Guild M.D.   On: 03/21/2021 09:54   DG Knee Complete 4 Views Right  Result Date: 03/21/2021 CLINICAL DATA:  pain after fall, h/o knee replacement EXAM: RIGHT KNEE - COMPLETE 4+ VIEW COMPARISON:  Radiograph 12/05/2016 FINDINGS: There is a 3 component total knee arthroplasty in normal alignment without evidence of loosening or periprosthetic fracture. Trace joint fluid. IMPRESSION: No evidence of right knee arthroplasty complication. No acute osseous abnormality. Electronically Signed   By: Maurine Simmering M.D.   On: 03/21/2021 09:56     Subjective: No acute issues or events overnight   Discharge Exam: Vitals:   03/25/21 2129 03/26/21 0537  BP: (!) 184/75 (!) 172/59  Pulse: 80 81  Resp: 16 16  Temp: 98.2 F (36.8 C) 98.3 F (36.8 C)  SpO2: 94% 94%   Vitals:   03/25/21 1340 03/25/21 2000 03/25/21 2129 03/26/21 0537  BP: (!) 164/67  (!) 184/75 (!) 172/59  Pulse: 76  80 81  Resp: _0 Temp: 98.2 F (36.8 C)  98.2 F (36.8 C) 98.3 F (36.8 C)  TempSrc:   Oral Oral  SpO2: 94% 91% 94% 94%  Weight:      Height:        General: Pt is alert, awake, not in acute distress Cardiovascular: RRR, S1/S2 +, no rubs, no gallops Respiratory: CTA bilaterally, no wheezing, no rhonchi Abdominal: Soft, NT, ND, bowel sounds + Extremities: no edema, no cyanosis    The results of significant diagnostics from this hospitalization (including imaging, microbiology, ancillary and laboratory) are listed below for reference.     Microbiology: Recent Results (from the past 240 hour(s))  Resp Panel by RT-PCR (Flu A&B, Covid) Nasopharyngeal Swab     Status: None   Collection Time:  03/21/21  9:38 AM   Specimen: Nasopharyngeal Swab; Nasopharyngeal(NP) swabs in vial transport medium  Result Value Ref Range Status   SARS Coronavirus 2 by RT PCR NEGATIVE NEGATIVE Final    Comment: (NOTE) SARS-CoV-2 target nucleic acids are NOT DETECTED.  The SARS-CoV-2 RNA is generally detectable in upper respiratory specimens during the acute phase of infection. The lowest concentration of SARS-CoV-2 viral copies this assay can detect is 138 copies/mL. A negative result does not preclude SARS-Cov-2 infection and should not be used as the sole basis for treatment or other patient management decisions. A negative result may occur with  improper specimen collection/handling, submission of specimen other than nasopharyngeal swab, presence of viral mutation(s) within the areas targeted by this assay, and inadequate number of viral copies(<138  copies/mL). A negative result must be combined with clinical observations, patient history, and epidemiological information. The expected result is Negative.  Fact Sheet for Patients:  EntrepreneurPulse.com.au  Fact Sheet for Healthcare Providers:  IncredibleEmployment.be  This test is no t yet approved or cleared by the [place] FDA and  has been authorized for detection and/or diagnosis of SARS-CoV-2 by FDA under an Emergency Use Authorization (EUA). This EUA will remain  in effect (meaning this test can be used) for the duration of the COVID-19 declaration under Section 564(b)(1) of the Act, 21 U.S.C.section 360bbb-3(b)(1), unless the authorization is terminated  or revoked sooner.       Influenza A by PCR NEGATIVE NEGATIVE Final   Influenza B by PCR NEGATIVE NEGATIVE Final    Comment: (NOTE) The Xpert Xpress SARS-CoV-2/FLU/RSV plus assay is intended as an aid in the diagnosis of influenza from Nasopharyngeal swab specimens and should not be used as a sole basis for treatment. Nasal washings  and aspirates are unacceptable for Xpert Xpress SARS-CoV-2/FLU/RSV testing.  Fact Sheet for Patients: EntrepreneurPulse.com.au  Fact Sheet for Healthcare Providers: IncredibleEmployment.be  This test is not yet approved or cleared by the [place] FDA and has been authorized for detection and/or diagnosis of SARS-CoV-2 by FDA under an Emergency Use Authorization (EUA). This EUA will remain in effect (meaning this test can be used) for the duration of the COVID-19 declaration under Section 564(b)(1) of the Act, 21 U.S.C. section 360bbb-3(b)(1), unless the authorization is terminated or revoked.  Performed at [facility], [address] [place], [place] [address]   MRSA Next Gen by PCR, Nasal     Status: None   Collection Time: 03/21/21  9:21 PM   Specimen: Nasal Mucosa; Nasal Swab  Result Value Ref Range Status   MRSA by PCR Next Gen NOT DETECTED NOT DETECTED Final    Comment: (NOTE) The GeneXpert MRSA Assay (FDA approved for NASAL specimens only), is one component of a comprehensive MRSA colonization surveillance program. It is not intended to diagnose MRSA infection nor to guide or monitor treatment for MRSA infections. Test performance is not FDA approved in patients less than 60 years old. Performed at [facility], [place] [address], [place], [place] [address]   Resp Panel by RT-PCR (Flu A&B, Covid) Nasopharyngeal Swab     Status: None   Collection Time: 03/25/21  3:43 PM   Specimen: Nasopharyngeal Swab; Nasopharyngeal(NP) swabs in vial transport medium  Result Value Ref Range Status   SARS Coronavirus 2 by RT PCR NEGATIVE NEGATIVE Final    Comment: (NOTE) SARS-CoV-2 target nucleic acids are NOT DETECTED.  The SARS-CoV-2 RNA is generally detectable in upper respiratory specimens during the acute phase of infection. The lowest concentration of SARS-CoV-2 viral copies this assay can detect  is 138 copies/mL. A negative result does not preclude SARS-Cov-2 infection and should not be used as the sole basis for treatment or other patient management decisions. A negative result may occur with  improper specimen collection/handling, submission of specimen other than nasopharyngeal swab, presence of viral mutation(s) within the areas targeted by this assay, and inadequate number of viral copies(<138 copies/mL). A negative result must be combined with clinical observations, patient history, and epidemiological information. The expected result is Negative.  Fact Sheet for Patients:  EntrepreneurPulse.com.au  Fact Sheet for Healthcare Providers:  IncredibleEmployment.be  This test is no t yet approved or cleared by the [place] FDA and  has been authorized for detection and/or diagnosis  of SARS-CoV-2 by FDA under an Emergency Use Authorization (EUA). This EUA will remain  in effect (meaning this test can be used) for the duration of the COVID-19 declaration under Section 564(b)(1) of the Act, 21 U.S.C.section 360bbb-3(b)(1), unless the authorization is terminated  or revoked sooner.       Influenza A by PCR NEGATIVE NEGATIVE Final   Influenza B by PCR NEGATIVE NEGATIVE Final    Comment: (NOTE) The Xpert Xpress SARS-CoV-2/FLU/RSV plus assay is intended as an aid in the diagnosis of influenza from Nasopharyngeal swab specimens and should not be used as a sole basis for treatment. Nasal washings and aspirates are unacceptable for Xpert Xpress SARS-CoV-2/FLU/RSV testing.  Fact Sheet for Patients: EntrepreneurPulse.com.au  Fact Sheet for Healthcare Providers: IncredibleEmployment.be  This test is not yet approved or cleared by the [place] FDA and has been authorized for detection and/or diagnosis of SARS-CoV-2 by FDA under an Emergency Use Authorization (EUA). This EUA will remain in effect  (meaning this test can be used) for the duration of the COVID-19 declaration under Section 564(b)(1) of the Act, 21 U.S.C. section 360bbb-3(b)(1), unless the authorization is terminated or revoked.  Performed at [facility], [place] [address] [place], [place] [address]      Labs: BNP (last 3 results) No results for input(s): BNP in the last 8760 hours. Basic Metabolic Panel: Recent Labs  Lab 03/21/21 0938 03/21/21 1011 03/22/21 0336  NA 136 135 140  K 3.8 3.7 3.6  CL 95*  --  102  CO2 32  --  34*  GLUCOSE 181*  --  163*  BUN 24*  --  16  CREATININE 1.37*  --  0.74  CALCIUM 8.7*  --  8.2*   Liver Function Tests: Recent Labs  Lab 03/22/21 0336  AST 13*  ALT 12  ALKPHOS 68  BILITOT 0.6  PROT 5.8*  ALBUMIN 2.9*   No results for input(s): LIPASE, AMYLASE in the last 168 hours. No results for input(s): AMMONIA in the last 168 hours. CBC: Recent Labs  Lab 03/21/21 0938 03/21/21 1011 03/21/21 1849 03/22/21 0336  WBC 11.1*  --  9.0 8.4  HGB 11.4* 11.9* 11.0* 10.6*  HCT 36.9 35.0* 34.4* 33.9*  MCV 94.1  --  93.5 95.0  PLT 240  --  208 211   Cardiac Enzymes: No results for input(s): CKTOTAL, CKMB, CKMBINDEX, TROPONINI in the last 168 hours. BNP: Invalid input(s): POCBNP CBG: Recent Labs  Lab 03/24/21 2106 03/25/21 0756 03/25/21 1150 03/25/21 1623 03/25/21 2132  GLUCAP 241* 196* 240* 268* 245*   D-Dimer No results for input(s): DDIMER in the last 72 hours. Hgb A1c No results for input(s): HGBA1C in the last 72 hours. Lipid Profile No results for input(s): CHOL, HDL, LDLCALC, TRIG, CHOLHDL, LDLDIRECT in the last 72 hours. Thyroid function studies No results for input(s): TSH, T4TOTAL, T3FREE, THYROIDAB in the last 72 hours.  Invalid input(s): FREET3 Anemia work up No results for input(s): VITAMINB12, FOLATE, FERRITIN, TIBC, IRON, RETICCTPCT in the last 72 hours. Urinalysis    Component Value Date/Time   COLORURINE YELLOW  02/04/2020 1155   APPEARANCEUR TURBID (A) 02/04/2020 1155   LABSPEC 1.021 02/04/2020 1155   PHURINE 5.0 02/04/2020 1155   GLUCOSEU NEGATIVE 02/04/2020 1155   HGBUR LARGE (A) 02/04/2020 1155   BILIRUBINUR Negative 08/29/2020 1545   KETONESUR NEGATIVE 02/04/2020 1155   PROTEINUR Negative 08/29/2020 1545   PROTEINUR 100 (A) 02/04/2020 1155   UROBILINOGEN 0.2 08/29/2020 1545  UROBILINOGEN 1.0 12/24/2014 1430   NITRITE Negative 08/29/2020 1545   NITRITE NEGATIVE 02/04/2020 1155   LEUKOCYTESUR Negative 08/29/2020 1545   LEUKOCYTESUR SMALL (A) 02/04/2020 1155   Sepsis Labs Invalid input(s): PROCALCITONIN,  WBC,  LACTICIDVEN Microbiology Recent Results (from the past 240 hour(s))  Resp Panel by RT-PCR (Flu A&B, Covid) Nasopharyngeal Swab     Status: None   Collection Time: 03/21/21  9:38 AM   Specimen: Nasopharyngeal Swab; Nasopharyngeal(NP) swabs in vial transport medium  Result Value Ref Range Status   SARS Coronavirus 2 by RT PCR NEGATIVE NEGATIVE Final    Comment: (NOTE) SARS-CoV-2 target nucleic acids are NOT DETECTED.  The SARS-CoV-2 RNA is generally detectable in upper respiratory specimens during the acute phase of infection. The lowest concentration of SARS-CoV-2 viral copies this assay can detect is 138 copies/mL. A negative result does not preclude SARS-Cov-2 infection and should not be used as the sole basis for treatment or other patient management decisions. A negative result may occur with  improper specimen collection/handling, submission of specimen other than nasopharyngeal swab, presence of viral mutation(s) within the areas targeted by this assay, and inadequate number of viral copies(<138 copies/mL). A negative result must be combined with clinical observations, patient history, and epidemiological information. The expected result is Negative.  Fact Sheet for Patients:  EntrepreneurPulse.com.au  Fact Sheet for Healthcare Providers:   IncredibleEmployment.be  This test is no t yet approved or cleared by the [place] FDA and  has been authorized for detection and/or diagnosis of SARS-CoV-2 by FDA under an Emergency Use Authorization (EUA). This EUA will remain  in effect (meaning this test can be used) for the duration of the COVID-19 declaration under Section 564(b)(1) of the Act, 21 U.S.C.section 360bbb-3(b)(1), unless the authorization is terminated  or revoked sooner.       Influenza A by PCR NEGATIVE NEGATIVE Final   Influenza B by PCR NEGATIVE NEGATIVE Final    Comment: (NOTE) The Xpert Xpress SARS-CoV-2/FLU/RSV plus assay is intended as an aid in the diagnosis of influenza from Nasopharyngeal swab specimens and should not be used as a sole basis for treatment. Nasal washings and aspirates are unacceptable for Xpert Xpress SARS-CoV-2/FLU/RSV testing.  Fact Sheet for Patients: EntrepreneurPulse.com.au  Fact Sheet for Healthcare Providers: IncredibleEmployment.be  This test is not yet approved or cleared by the [place] FDA and has been authorized for detection and/or diagnosis of SARS-CoV-2 by FDA under an Emergency Use Authorization (EUA). This EUA will remain in effect (meaning this test can be used) for the duration of the COVID-19 declaration under Section 564(b)(1) of the Act, 21 U.S.C. section 360bbb-3(b)(1), unless the authorization is terminated or revoked.  Performed at [facility], [address] [place], [place] [address]   MRSA Next Gen by PCR, Nasal     Status: None   Collection Time: 03/21/21  9:21 PM   Specimen: Nasal Mucosa; Nasal Swab  Result Value Ref Range Status   MRSA by PCR Next Gen NOT DETECTED NOT DETECTED Final    Comment: (NOTE) The GeneXpert MRSA Assay (FDA approved for NASAL specimens only), is one component of a comprehensive MRSA colonization surveillance program. It is not intended  to diagnose MRSA infection nor to guide or monitor treatment for MRSA infections. Test performance is not FDA approved in patients less than 62 years old. Performed at [facility], [place] [address], [place], [place] [address]   Resp Panel by RT-PCR (Flu A&B, Covid) Nasopharyngeal Swab  Status: None   Collection Time: 03/25/21  3:43 PM   Specimen: Nasopharyngeal Swab; Nasopharyngeal(NP) swabs in vial transport medium  Result Value Ref Range Status   SARS Coronavirus 2 by RT PCR NEGATIVE NEGATIVE Final    Comment: (NOTE) SARS-CoV-2 target nucleic acids are NOT DETECTED.  The SARS-CoV-2 RNA is generally detectable in upper respiratory specimens during the acute phase of infection. The lowest concentration of SARS-CoV-2 viral copies this assay can detect is 138 copies/mL. A negative result does not preclude SARS-Cov-2 infection and should not be used as the sole basis for treatment or other patient management decisions. A negative result may occur with  improper specimen collection/handling, submission of specimen other than nasopharyngeal swab, presence of viral mutation(s) within the areas targeted by this assay, and inadequate number of viral copies(<138 copies/mL). A negative result must be combined with clinical observations, patient history, and epidemiological information. The expected result is Negative.  Fact Sheet for Patients:  EntrepreneurPulse.com.au  Fact Sheet for Healthcare Providers:  IncredibleEmployment.be  This test is no t yet approved or cleared by the [place] FDA and  has been authorized for detection and/or diagnosis of SARS-CoV-2 by FDA under an Emergency Use Authorization (EUA). This EUA will remain  in effect (meaning this test can be used) for the duration of the COVID-19 declaration under Section 564(b)(1) of the Act, 21 U.S.C.section 360bbb-3(b)(1), unless the authorization is terminated  or  revoked sooner.       Influenza A by PCR NEGATIVE NEGATIVE Final   Influenza B by PCR NEGATIVE NEGATIVE Final    Comment: (NOTE) The Xpert Xpress SARS-CoV-2/FLU/RSV plus assay is intended as an aid in the diagnosis of influenza from Nasopharyngeal swab specimens and should not be used as a sole basis for treatment. Nasal washings and aspirates are unacceptable for Xpert Xpress SARS-CoV-2/FLU/RSV testing.  Fact Sheet for Patients: EntrepreneurPulse.com.au  Fact Sheet for Healthcare Providers: IncredibleEmployment.be  This test is not yet approved or cleared by the [place] FDA and has been authorized for detection and/or diagnosis of SARS-CoV-2 by FDA under an Emergency Use Authorization (EUA). This EUA will remain in effect (meaning this test can be used) for the duration of the COVID-19 declaration under Section 564(b)(1) of the Act, 21 U.S.C. section 360bbb-3(b)(1), unless the authorization is terminated or revoked.  Performed at [facility], [place] [address] [place],  [address]      Time coordinating discharge: Over 30 minutes  SIGNED:   Little Ishikawa, DO [facility] 03/26/2021, 7:32 AM Pager   If 7PM-7AM, please contact night-coverage [email or web address]

## 2021-03-27 DIAGNOSIS — J189 Pneumonia, unspecified organism: Secondary | ICD-10-CM | POA: Diagnosis not present

## 2021-03-27 DIAGNOSIS — E118 Type 2 diabetes mellitus with unspecified complications: Secondary | ICD-10-CM | POA: Diagnosis not present

## 2021-03-27 DIAGNOSIS — R262 Difficulty in walking, not elsewhere classified: Secondary | ICD-10-CM | POA: Diagnosis not present

## 2021-03-27 DIAGNOSIS — E669 Obesity, unspecified: Secondary | ICD-10-CM | POA: Diagnosis not present

## 2021-03-27 DIAGNOSIS — F339 Major depressive disorder, recurrent, unspecified: Secondary | ICD-10-CM | POA: Diagnosis not present

## 2021-03-27 DIAGNOSIS — M25469 Effusion, unspecified knee: Secondary | ICD-10-CM | POA: Diagnosis not present

## 2021-03-27 DIAGNOSIS — J9601 Acute respiratory failure with hypoxia: Secondary | ICD-10-CM | POA: Diagnosis not present

## 2021-03-27 DIAGNOSIS — E0841 Diabetes mellitus due to underlying condition with diabetic mononeuropathy: Secondary | ICD-10-CM | POA: Diagnosis not present

## 2021-03-27 DIAGNOSIS — Z794 Long term (current) use of insulin: Secondary | ICD-10-CM | POA: Diagnosis not present

## 2021-03-30 ENCOUNTER — Ambulatory Visit: Payer: Medicare PPO | Admitting: Family Medicine

## 2021-03-31 ENCOUNTER — Ambulatory Visit: Payer: Medicare PPO | Admitting: Family Medicine

## 2021-03-31 DIAGNOSIS — T8189XA Other complications of procedures, not elsewhere classified, initial encounter: Secondary | ICD-10-CM | POA: Diagnosis not present

## 2021-04-03 DIAGNOSIS — J189 Pneumonia, unspecified organism: Secondary | ICD-10-CM | POA: Diagnosis not present

## 2021-04-03 DIAGNOSIS — J9601 Acute respiratory failure with hypoxia: Secondary | ICD-10-CM | POA: Diagnosis not present

## 2021-04-03 DIAGNOSIS — M25469 Effusion, unspecified knee: Secondary | ICD-10-CM | POA: Diagnosis not present

## 2021-04-03 DIAGNOSIS — K580 Irritable bowel syndrome with diarrhea: Secondary | ICD-10-CM | POA: Diagnosis not present

## 2021-04-03 DIAGNOSIS — N823 Fistula of vagina to large intestine: Secondary | ICD-10-CM | POA: Diagnosis not present

## 2021-04-07 DIAGNOSIS — T8189XA Other complications of procedures, not elsewhere classified, initial encounter: Secondary | ICD-10-CM | POA: Diagnosis not present

## 2021-04-08 DIAGNOSIS — J189 Pneumonia, unspecified organism: Secondary | ICD-10-CM | POA: Diagnosis not present

## 2021-04-08 DIAGNOSIS — J45909 Unspecified asthma, uncomplicated: Secondary | ICD-10-CM | POA: Diagnosis not present

## 2021-04-08 DIAGNOSIS — Z9981 Dependence on supplemental oxygen: Secondary | ICD-10-CM | POA: Diagnosis not present

## 2021-04-08 DIAGNOSIS — J479 Bronchiectasis, uncomplicated: Secondary | ICD-10-CM | POA: Diagnosis not present

## 2021-04-09 DIAGNOSIS — Z20828 Contact with and (suspected) exposure to other viral communicable diseases: Secondary | ICD-10-CM | POA: Diagnosis not present

## 2021-04-09 DIAGNOSIS — J9690 Respiratory failure, unspecified, unspecified whether with hypoxia or hypercapnia: Secondary | ICD-10-CM | POA: Diagnosis not present

## 2021-04-09 DIAGNOSIS — D649 Anemia, unspecified: Secondary | ICD-10-CM | POA: Diagnosis not present

## 2021-04-09 DIAGNOSIS — N823 Fistula of vagina to large intestine: Secondary | ICD-10-CM | POA: Diagnosis not present

## 2021-04-13 ENCOUNTER — Ambulatory Visit (HOSPITAL_BASED_OUTPATIENT_CLINIC_OR_DEPARTMENT_OTHER): Payer: Medicare PPO | Admitting: Obstetrics & Gynecology

## 2021-04-13 DIAGNOSIS — G4700 Insomnia, unspecified: Secondary | ICD-10-CM | POA: Diagnosis not present

## 2021-04-13 DIAGNOSIS — J45909 Unspecified asthma, uncomplicated: Secondary | ICD-10-CM | POA: Diagnosis not present

## 2021-04-13 DIAGNOSIS — E119 Type 2 diabetes mellitus without complications: Secondary | ICD-10-CM | POA: Diagnosis not present

## 2021-04-13 DIAGNOSIS — J479 Bronchiectasis, uncomplicated: Secondary | ICD-10-CM | POA: Diagnosis not present

## 2021-04-13 DIAGNOSIS — Z9981 Dependence on supplemental oxygen: Secondary | ICD-10-CM | POA: Diagnosis not present

## 2021-04-14 DIAGNOSIS — T8189XA Other complications of procedures, not elsewhere classified, initial encounter: Secondary | ICD-10-CM | POA: Diagnosis not present

## 2021-04-16 DIAGNOSIS — J9691 Respiratory failure, unspecified with hypoxia: Secondary | ICD-10-CM | POA: Diagnosis not present

## 2021-04-16 DIAGNOSIS — R251 Tremor, unspecified: Secondary | ICD-10-CM | POA: Diagnosis not present

## 2021-04-16 DIAGNOSIS — R1110 Vomiting, unspecified: Secondary | ICD-10-CM | POA: Diagnosis not present

## 2021-04-16 DIAGNOSIS — I959 Hypotension, unspecified: Secondary | ICD-10-CM | POA: Diagnosis not present

## 2021-04-17 DIAGNOSIS — N823 Fistula of vagina to large intestine: Secondary | ICD-10-CM | POA: Diagnosis not present

## 2021-04-17 DIAGNOSIS — J189 Pneumonia, unspecified organism: Secondary | ICD-10-CM | POA: Diagnosis not present

## 2021-04-17 DIAGNOSIS — I10 Essential (primary) hypertension: Secondary | ICD-10-CM | POA: Diagnosis not present

## 2021-04-17 DIAGNOSIS — E119 Type 2 diabetes mellitus without complications: Secondary | ICD-10-CM | POA: Diagnosis not present

## 2021-04-17 DIAGNOSIS — J479 Bronchiectasis, uncomplicated: Secondary | ICD-10-CM | POA: Diagnosis not present

## 2021-04-17 DIAGNOSIS — M25469 Effusion, unspecified knee: Secondary | ICD-10-CM | POA: Diagnosis not present

## 2021-04-17 DIAGNOSIS — J9691 Respiratory failure, unspecified with hypoxia: Secondary | ICD-10-CM | POA: Diagnosis not present

## 2021-04-17 DIAGNOSIS — J45909 Unspecified asthma, uncomplicated: Secondary | ICD-10-CM | POA: Diagnosis not present

## 2021-04-17 DIAGNOSIS — F339 Major depressive disorder, recurrent, unspecified: Secondary | ICD-10-CM | POA: Diagnosis not present

## 2021-04-17 NOTE — Progress Notes (Incomplete)
Phone [phone] In person visit   Subjective:   [name] is a 80 y.o. year old very pleasant female patient who presents for/with See problem oriented charting No chief complaint on file.   This visit occurred during the SARS-CoV-2 public health emergency.  Safety protocols were in place, including screening questions prior to the visit, additional usage of staff PPE, and extensive cleaning of exam room while observing appropriate contact time as indicated for disinfecting solutions.   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 11/22/2019   Healthcare maintenance 02/22/2019   Physical deconditioning 02/22/2019   SOB (shortness of breath) on exertion 02/22/2019   Adenomatous polyp 12/20/2018   Pain due to onychomycosis of toenails of both feet 11/08/2018   Diabetic neuropathy ([place]) 11/08/2018   History of total knee replacement, bilateral 11/14/2017   Personal history of colonic polyps 05/18/2017   Arthritis of midfoot 03/03/2016   Hammertoes of both feet 03/03/2016   Aortic atherosclerosis ([place]) 02/16/2016   Solitary pulmonary nodule 01/08/2016   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   Bronchiectasis ([place]) 10/18/2015   IBS (irritable bowel syndrome) 07/17/2014   Recurrent major depression in full remission ([place]) 01/03/2014   Posterior tibial tendon dysfunction 01/03/2014   Osteoarthritis, knee 01/03/2014   Glaucoma 01/03/2014   Hypertension associated with diabetes ([place]) 01/03/2014   Obesity (BMI 30-39.9) 06/15/2013   Primary open-angle glaucoma 08/02/2012   Foot tendinitis 07/20/2010   INSOMNIA, CHRONIC 07/25/2009    Fatty liver 03/18/2009   GERD 12/25/2007   Hyperlipidemia associated with type 2 diabetes mellitus ([place]) 07/26/2007   ANEMIA, B12 DEFICIENCY 12/29/2006   Diabetes mellitus type II, controlled ([place]) 12/28/2006   RESTLESS LEG SYNDROME, MILD 12/28/2006   NEUROPATHY, IDIOPATHIC PERIPHERAL NEC 12/28/2006   Osteoporosis 12/12/2006   BREAST CANCER, HX OF 12/12/2006    Medications- reviewed and updated Current Outpatient Medications  Medication Sig Dispense Refill   ACCU-CHEK GUIDE test strip USE 1 TO TEST UP TO TWICE DAILY 200 each 0   atenolol (TENORMIN) 25 MG tablet Take 1 tablet by mouth once daily (Patient taking differently: Take 25 mg by mouth daily.) 90 tablet 3   Bacillus Coagulans-Inulin (ALIGN PREBIOTIC-PROBIOTIC PO) Take 1 capsule by mouth daily.      blood glucose meter kit and supplies KIT Dispense based on patient and insurance preference. Use up to four times daily as directed. Dx E11.9 1 each 0   budesonide-formoterol (SYMBICORT) 80-4.5 MCG/ACT inhaler Inhale 2 puffs into the lungs 2 (two) times daily. 1 each 3   cetirizine (ZYRTEC) 10 MG tablet Take 10 mg by mouth every morning.      Cholecalciferol (VITAMIN D3) 125 MCG (5000 UT) TABS Take 5,000 Units by mouth daily.     Coenzyme Q10 (COQ10) 200 MG CAPS Take 200 mg by mouth daily.     colesevelam (WELCHOL) 625 MG tablet TAKE 1 TABLET BY MOUTH ONCE DAILY IN THE MORNING AND AT NOON AND AT BEDTIME (Patient taking differently: Take 625 mg by mouth 3 (three) times daily.) 90 tablet 3   desvenlafaxine (PRISTIQ) 50 MG 24 hr tablet Take 1 tablet (50 mg total) by mouth daily. [place]  tablet 3   dicyclomine (BENTYL) 20 MG tablet Take 1 tablet (20 mg total) by mouth in the morning, at noon, in the evening, and at bedtime. 120 tablet 5   dorzolamide-timolol (COSOPT) 22.3-6.8 MG/ML ophthalmic solution Place 1 drop into both eyes 2 (two) times daily.      ESTRING 2 MG vaginal ring INSERT 1 RING  INTO VAGINA EVERY 3 MONTHS 1 each 4   FIBER PO Take 2  tablets by mouth every evening.     gabapentin (NEURONTIN) 100 MG capsule TAKE 1 CAPSULE BY MOUTH THREE TIMES DAILY AS NEEDED FOR  NEUROPATHIC  PAIN. (Patient taking differently: Take 200 mg by mouth 2 (two) times daily.) 270 capsule 3   glimepiride (AMARYL) 4 MG tablet TAKE 2 TABLETS BY MOUTH ONCE DAILY WITH BREAKFAST 180 tablet 0   glucose blood (ACCU-CHEK GUIDE) test strip USE 1 ONCE DAILY TO TEST BLOOD SUGAR 100 each 0   HYDROcodone-acetaminophen (NORCO) 5-325 MG tablet Take 1-2 tablets by mouth every 6 (six) hours as needed for moderate pain or severe pain. 30 tablet 0   insulin degludec (TRESIBA) 200 UNIT/ML FlexTouch Pen Inject 60-80 Units into the skin in the morning. (Patient taking differently: Inject 80 Units into the skin in the morning.) 4 mL 11   Insulin Pen Needle (NOVOFINE PLUS PEN NEEDLE) 32G X 4 MM MISC 1 each by Does not apply route daily. 30 each 5   Lancets Misc. (ACCU-CHEK FASTCLIX LANCET) KIT Use to test blood sugars daily. Dx: E11.9 1 kit 5   latanoprost (XALATAN) 0.005 % ophthalmic solution Place 1 drop into the right eye at bedtime.      lisinopril (ZESTRIL) 10 MG tablet Take 1 tablet (10 mg total) by mouth daily. 90 tablet 3   lovastatin (MEVACOR) 10 MG tablet Take 1 tablet (10 mg total) by mouth daily. 90 tablet 3   Misc Natural Products (NEURIVA PO) Take 1 capsule by mouth daily.     Multiple Vitamin (MULTIVITAMIN WITH MINERALS) TABS tablet Take 1 tablet by mouth daily. 3 Fruit Capsules 3 Vegetable Capsules - Balance of Nature 30 tablet 0   pantoprazole (PROTONIX) 40 MG tablet Take 1 tablet (40 mg total) by mouth daily. Take 1 hour around food. 30 tablet 3   pramipexole (MIRAPEX) 0.5 MG tablet TAKE 1 TABLET BY MOUTH AT BEDTIME (Patient taking differently: Take 0.5 mg by mouth at bedtime.) 90 tablet 0   Respiratory Therapy Supplies (FLUTTER) DEVI Use as directed 1 each 0   ROCKLATAN 0.02-0.005 % SOLN Place 1 drop into the left eye at bedtime.      Selenium 200 MCG CAPS  Take 200 mcg by mouth at bedtime.      sulfamethoxazole-trimethoprim (BACTRIM) 400-80 MG tablet Take 1 tablet by mouth daily. 90 tablet 1   temazepam (RESTORIL) 15 MG capsule TAKE 1 CAPSULE BY MOUTH AT BEDTIME AS NEEDED FOR SLEEP (Patient taking differently: Take 15 mg by mouth at bedtime.) 30 capsule 5   UNABLE TO FIND Take 2 tablets by mouth at bedtime. Fiber therapy     vitamin B-12 (CYANOCOBALAMIN) 1000 MCG tablet Take 1,000 mcg by mouth daily.     No current facility-administered medications for this visit.     Objective:  LMP 05/10/1992 Comment: partial Gen: NAD, resting comfortably CV: RRR no murmurs rubs or gallops Lungs: CTAB no crackles, wheeze, rhonchi Abdomen: soft/nontender/nondistended/normal bowel sounds. No rebound or guarding.  Ext: no edema Skin: warm, dry Neuro: grossly normal, moves all extremities  ***      Assessment and Plan   Dr. Miller- ob/gyn. Estring.  Dr. Laura Lomax-dermatology Dr. Donald Digby- optho Dr. Brent Bond-[facility]. Glaucoma.  Dr. Byers-ENT (hyperbaric for infection had in feet-ear plugs) Dr. Frank Allusio-ortho Dr. Dora brodie- GI (no longer sees) Dr. Christian Streck (surgeon)  ***01/19/18 awv- recommended again 06/18/19  ***I&D anal abscess then fistula procedure with Dr. gross oct 2021 ***** urolgoy referral recurrent UTI- refer on 06/23/20  Reviewed urine cultures: 08/11/2020-patient with mixed genital flora not indicative of UTI 07/17/2020-patient with Klebsiella pneumonia 100,000 colonies resistant to ampicillin, ciprofloxacin, levofloxacin, nitrofurantoin, Bactrim. Multiple allergies and ultimately treated with Augmentin. 06/24/2020-patient with Klebsiella pneumonia 100,000 colonies resistant to ampicillin, ciprofloxacin, levofloxacin, nitrofurantoin, Bactrim. She was treated with Augmentin and referred to urology 05/13/2020-patient with Klebsiella pneumonia 100,000 colonies with similar resistance pattern-treated with  Augmentin 05/05/20 Urine culture negative 03/07/20 fistula surgery with Dr. Gross 02/21/20 Klebsiella pneumoniae 02/05/20 multiple species 01/25/20 klebseilla pneumoniae (only resistant to ampicillin and nitrofurantoin) 01/08/20 klebsiella  Pneumoniae (resisnat to ampicillin and nitrofurantoin)  Also had follow-up with Dr. Gross on 01/05/2021-she reports she was released***  -keflex issues- irritates IBS -treated by Dr. Miller with Bactrim- as was not tolerating keflex  # Hospitalized F/U Hypoxia S:Pt was admitted 03/21/21 and discharged 03/26/21 presented with secondary to right knee pain and had found evidence of pneumonia with associated hypoxia. She started empirically on Ceftriaxone and azithromycin. Right knee with moderate-large effusion; no fracture on x-ray imaging and no further work-up per orthopedics.  -Weaned oxygen as tolerated and completed abx course in hospital with hypoxia.  A/P: ***   #Concern for UTI with dysuria and incontinence S: Patient stated she had a strong urge to urinate and burned when it came out. Will wake up with incontinence and that was not normal for her.  - she had an anal fissure and reports that was larger around 10/22 visit. Seen stephanie Hudnell- she took 2 pills of bactrim for 3 days and back to daily after that for prevention but held for last 30 hours at least in case she needed urine. No improvement in symptoms. Wearing diaper around this time.    Exploratory surgery about fistula on November 10th.  History of surgery for this -Recommended to consider follow-up with urogynecology but patient would like to first do her exploratory surgery on November 10-I thought that was a reasonable choice-perhaps if she had an active UTI that was not covered by Bactrim we can give her short-term reprieve from her symptoms with alternate antibiotic and then return to Bactrim but I am not confident in ability to prevent UTIs until there is a clear permanent solution to  fistula A/P: ***   # Depression S: Medication: Patient was not tolerating Pristiq 100 mg well and did not significantly improve depression-she wanted to switch back to Prozac with plan of eventually adding Wellbutrin to see if this would help her.  She thought pristiq made her sleepy but when changed to prozac that really was not the case and she had reverted back to the pristiq on 50mg- had not made change to 100mg yet but already nothing some improvement with switching back. Felt less irritable on this- more optimistic.  A/P: ***   # Diabetes S: Medication: Tresiba 75 units - after was 165.  Glimepiride 8 mg. - had a tough time cutting out her ice cream which doesn't help.  -metformin upsets stomach -jardiance not a good choice with UTI's CBGs- *** Exercise and diet- *** Lab Results  Component   Value Date   HGBA1C 8.4 (H) 03/09/2021   HGBA1C 8.5 (H) 12/18/2020   HGBA1C 8.3 (H) 08/11/2020    A/P: ***   #Mild elevation of blood pressure-previously well controlled BP Readings from Last 3 Encounters:  03/26/21 (!) 162/57  03/19/21 114/67  03/09/21 (!) 147/56  A/P: ***   Health Maintenance Due  Topic Date Due   COVID-19 Vaccine (4 - Booster for Pfizer series) 06/19/2020   INFLUENZA VACCINE  12/08/2020   Recommended follow up: No follow-ups on file. Future Appointments  Date Time Provider [place]  04/21/2021 11:00 AM Clayton Bibles, NP [facility] None  04/24/2021  1:40 PM Marin Olp, MD [facility]    Lab/Order associations: No diagnosis found.  No orders of the defined types were placed in this encounter.   I,Jada Bradford,acting as a scribe for [name], MD.,have documented all relevant documentation on the behalf of [name], MD,as directed by  [name], MD while in the presence of [name], MD.  *** Return precautions advised.  Burnett Corrente

## 2021-04-20 ENCOUNTER — Telehealth: Payer: Self-pay

## 2021-04-20 NOTE — Telephone Encounter (Signed)
Called and spoke with Pam and verbal orders given.

## 2021-04-20 NOTE — Telephone Encounter (Signed)
Home Health Verbal Orders  Agency:  [facility]  Contact and title  Requesting OT/ PT/ Skilled nursing/ Social Work/ Speech:  Skilled Nursing   Comments: started care on 12/10   Frequency:  once a week for one week // twice a week for 3 weeks // once a week for 6 weeks and two PRN   HH needs F2F w/in last 30 days .

## 2021-04-21 ENCOUNTER — Ambulatory Visit (INDEPENDENT_AMBULATORY_CARE_PROVIDER_SITE_OTHER): Payer: Medicare PPO

## 2021-04-21 ENCOUNTER — Other Ambulatory Visit: Payer: Self-pay

## 2021-04-21 ENCOUNTER — Encounter: Payer: Self-pay | Admitting: Nurse Practitioner

## 2021-04-21 ENCOUNTER — Ambulatory Visit: Payer: Medicare PPO | Admitting: Nurse Practitioner

## 2021-04-21 VITALS — BP 122/68 | HR 85 | Ht 72.0 in

## 2021-04-21 DIAGNOSIS — K219 Gastro-esophageal reflux disease without esophagitis: Secondary | ICD-10-CM

## 2021-04-21 DIAGNOSIS — Z09 Encounter for follow-up examination after completed treatment for conditions other than malignant neoplasm: Secondary | ICD-10-CM | POA: Diagnosis not present

## 2021-04-21 DIAGNOSIS — J479 Bronchiectasis, uncomplicated: Secondary | ICD-10-CM

## 2021-04-21 DIAGNOSIS — J9601 Acute respiratory failure with hypoxia: Secondary | ICD-10-CM | POA: Diagnosis not present

## 2021-04-21 DIAGNOSIS — J301 Allergic rhinitis due to pollen: Secondary | ICD-10-CM | POA: Diagnosis not present

## 2021-04-21 DIAGNOSIS — J90 Pleural effusion, not elsewhere classified: Secondary | ICD-10-CM | POA: Diagnosis not present

## 2021-04-21 DIAGNOSIS — J398 Other specified diseases of upper respiratory tract: Secondary | ICD-10-CM | POA: Diagnosis not present

## 2021-04-21 DIAGNOSIS — J189 Pneumonia, unspecified organism: Secondary | ICD-10-CM

## 2021-04-21 MED ORDER — ALBUTEROL SULFATE (2.5 MG/3ML) 0.083% IN NEBU: 2.5000 mg | INHALATION_SOLUTION | Freq: Four times a day (QID) | RESPIRATORY_TRACT | 12 refills | Status: DC | PRN

## 2021-04-21 MED ORDER — ALBUTEROL SULFATE HFA 108 (90 BASE) MCG/ACT IN AERS
2.0000 | INHALATION_SPRAY | Freq: Four times a day (QID) | RESPIRATORY_TRACT | 2 refills | Status: DC | PRN
Start: 2021-04-21 — End: 2021-10-13

## 2021-04-21 MED ORDER — FLUTICASONE PROPIONATE 50 MCG/ACT NA SUSP: 1.0000 | Freq: Every day | NASAL | 2 refills | Status: DC

## 2021-04-21 MED ORDER — MONTELUKAST SODIUM 10 MG PO TABS: 10.0000 mg | ORAL_TABLET | Freq: Every day | ORAL | 11 refills | Status: DC

## 2021-04-21 NOTE — Progress Notes (Addendum)
$'@Patient'y$  ID: [name], female    DOB: [birth year], 80 y.o.   MRN: [id number]  Chief Complaint  Patient presents with   Follow-up    4 mo f/u for bronchiectasis. Was in the hospital last month for hypoxia. Was discharged on 1-3L of O2. The hoarseness has remained the same.     Referring provider: Marin Olp, MD  HPI: 80 year old female, never smoker followed for bronchiectasis and tracheomalacia and asthma.  She is a patient of Dr. Agustina Caroli and was last seen in office on 12/31/2020.  Past medical history significant for breast cancer, diabetes, hypertension, hyperlipidemia, endometriosis, gastritis and esophageal dysplasia.  TEST/EVENTS:  12/02/2015 PFTs: FVC 2.63 (70), FEV1 2.02 (71), ratio 77, RV 2.45 (91), TLC 5.02 (81), DLCO corrected 25.96 (75) 12/04/2015 HRCT: Atherosclerosis.  No lymphadenopathy.  No significant regions of groundglass attenuation, subpleural reticulation, parenchymal banding, traction bronchiectasis or frank honeycombing.  Mild air trapping.  Compression of the trachea and mainstem bronchi on expiratory phase indicative of severe tracheobronchomalacia.  Few scattered areas of very mild cylindrical bronchiectasis noted, most evident in inferior segment of the lingula and in the right middle lobe.  4 mm right upper lobe nodule.  Few scattered 1-3 mm pulmonary nodules noted throughout the periphery of the lungs bilaterally, nonspecific but favor to be representative of benign areas of mucoid impaction. 01/26/2017 CT chest without contrast: Atherosclerosis.  No lymphadenopathy.  4 mm right upper lobe pulmonary nodule unchanged.  Small right lower lobe nodule unchanged.  Airways normal. 11/27/2020 echocardiogram: LVEF 60 to 65%.  G1 DD.  Trivial MV regurgitation. 03/21/2021 CXR 1 view: Mild streaky density at the left base.  No edema, effusion or pneumothorax.  Normal heart size.  Mild aortic tuberosity.  Mild atelectasis 03/21/2021 CTA chest: Inhomogeneous attenuation  in the thyroid with possible low-density nodules.  Small patchy infiltrates are seen in the posterior lower lung fields suggesting atelectasis/pneumonitis.  No PE.  Multiple low-density foci of varying size in the liver, suggest scarring from previous intervention or inflammatory or neoplastic process.  12/31/2020: OV with Dr. Lamonte Sakai.  Mixed disease on prior PFTs.  Chronic hoarseness and upper airway irritation, often loses voice.  Trialed off Symbicort in June.  Started PPI and changed lisinopril to valsartan in June.  Reported unable to tolerate being off lisinopril and restarted.  Was able to tolerate being off the Symbicort.  Not currently using any BD.  Continue Claritin.  Trial off of Protonix.  Continue flutter valve.  02/11/2021: OV with PCP. Restarted symbicort 80 d/t worsening SOB. Persistent lower extremity edema - pt reluctant to start diuretic. Former echo with G1DD and EF 60-65%.  03/21/2021-03/26/2021: Hospital admission for acute respiratory failure related to CAP and recent fall with effusion of right knee. Recent anal fissure surgical repair with urinary retention requiring foley catheter. Placed on prophylactic bactrim. Completed abx for CAP during hospitalization. Knee effusion aspiration with 50 cc bloody fluid. Discharged on 1L [place] to rehab (required 1-3 lpm at rehab)  04/21/2021: Today - hospital follow up Patient presents today with her husband after hospitalization for acute respiratory failure related to CAP. She was discharged to rehab and returned home Friday. She reports her breathing has improved and she only experiences mild shortness of breath with exertion if she is not wearing her oxygen. She has generalized weakness and is scheduled for home PT therapy. Her breathing had previously improved after restarting her Symbicort in October. She continues to experience hoarseness, despite multiple attempted treatment regimens.  She also endorses allergy symptoms with clear rhinorrhea  and occasional congestion. She denies wheezing, cough, chest pain, PND, orthopnea, or lower extremity swelling. She denies any recent fevers or chills. She continues on Symbicort Twice daily, Zyrtec daily, and protonix daily. She continues on 1-3 lpm supplemental O2 and her O2 saturations have been maintaining >90% at home. Overall, she feels better and offers no further complaints.    Allergies  Allergen Reactions   Nitrofurantoin Other (See Comments)    Severe headache HEADACHE   Levaquin [Levofloxacin In D5w] Other (See Comments)    Pain in tendons    Brimonidine Other (See Comments)    Made eyes and surrounding areas RED/ was an eye drop   Cephalexin Diarrhea and Other (See Comments)    Patient can't remember reaction (per chart at [facility] states diarrhea) (tolerates Augmentin fine)   Erythromycin Ethylsuccinate Hives and Diarrhea   Oxycodone Itching    Immunization History  Administered Date(s) Administered   Fluad Quad(high Dose 65+) 02/22/2019, 02/20/2020   H1N1 06/05/2008   Influenza Split 02/07/2012   Influenza Whole 03/10/2000, 01/24/2008, 03/10/2009, 01/20/2010   Influenza, High Dose Seasonal PF 02/12/2013, 01/31/2017, 01/19/2018   Influenza,inj,Quad PF,6+ Mos 01/17/2014, 03/27/2015   Influenza,inj,quad, With Preservative 02/07/2017   Influenza-Unspecified 02/09/2016   PFIZER(Purple Top)SARS-COV-2 Vaccination 06/03/2019, 06/25/2019, 04/24/2020   Pneumococcal Conjugate-13 01/17/2014   Pneumococcal Polysaccharide-23 03/10/2000, 05/02/2007, 01/02/2012   Td 05/10/1992, 12/25/2007   Zoster Recombinat (Shingrix) 02/24/2018, 04/26/2018    Past Medical History:  Diagnosis Date   Allergy    Anemia    Anxiety    Arthritis    right knee;injections every 71months    Asthma    use daily   Breast cancer ([place]) 1994/1995   HX BREAST CANCER/ right brreast and left breast in 1995   Bronchiectasis ([place])    COMPRESSION FRACTURE, LUMBAR VERTEBRAE 08/21/2008   Qualifier: Diagnosis of   By: Arnoldo Morale MD, Balinda Quails    Depression    Diabetes mellitus    takes Amaryl and Januvia daily   Diverticulitis    Dyspnea    with activity   Early cataracts, bilateral    Eczema    Endometriosis    Fatty liver    Gastritis    Glaucoma    Hammer toe    History of kidney stones    Hyperlipidemia associated with type 2 diabetes mellitus ([place]) 07/26/2007   Diet/exercise control.     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   Thyroid disease    Vitamin D deficiency    takes Vit d daily   Weakness    uses a walker and wheelchair    Tobacco History: Social History   Tobacco Use  Smoking  Status Never  Smokeless Tobacco Never   Counseling given: Not Answered   Outpatient Medications Prior to Visit  Medication Sig Dispense Refill   ACCU-CHEK GUIDE test strip USE 1 TO TEST UP TO TWICE DAILY 200 each 0   atenolol (TENORMIN) 25 MG tablet Take 1 tablet by mouth once daily (Patient taking differently: Take 25 mg by mouth daily.) 90 tablet 3   Bacillus Coagulans-Inulin (ALIGN PREBIOTIC-PROBIOTIC PO) Take 1 capsule by mouth daily.      blood glucose meter kit and supplies KIT Dispense based on patient and insurance preference. Use up to four times daily as directed. Dx E11.9 1 each 0   budesonide-formoterol (SYMBICORT) 80-4.5 MCG/ACT inhaler Inhale 2 puffs into the lungs 2 (two) times  daily. 1 each 3   cetirizine (ZYRTEC) 10 MG tablet Take 10 mg by mouth every morning.      Cholecalciferol (VITAMIN D3) 125 MCG (5000 UT) TABS Take 5,000 Units by mouth daily.     Coenzyme Q10 (COQ10) 200 MG CAPS Take 200 mg by mouth daily.     colesevelam (WELCHOL) 625 MG tablet TAKE 1 TABLET BY MOUTH ONCE DAILY IN THE MORNING AND AT NOON AND AT BEDTIME (Patient taking differently: Take 625 mg by mouth 3 (three) times daily.) 90 tablet 3   desvenlafaxine (PRISTIQ) 50 MG 24 hr tablet Take 1 tablet (50 mg total) by mouth daily. 90 tablet 3   dicyclomine (BENTYL) 20 MG tablet Take 1 tablet (20 mg total) by mouth in the morning, at noon, in the evening, and at bedtime. 120 tablet 5   dorzolamide-timolol (COSOPT) 22.3-6.8 MG/ML ophthalmic solution Place 1 drop into both eyes 2 (two) times daily.      ESTRING 2 MG vaginal ring INSERT 1 RING  INTO VAGINA EVERY 3 MONTHS 1 each 4   FIBER PO Take 2 tablets by mouth every evening.     gabapentin (NEURONTIN) 100 MG capsule TAKE 1 CAPSULE BY MOUTH THREE TIMES DAILY AS NEEDED FOR  NEUROPATHIC  PAIN. (Patient taking differently: Take 200 mg by mouth 2 (two) times daily.) 270 capsule 3   glimepiride (AMARYL) 4 MG tablet TAKE 2 TABLETS BY MOUTH ONCE DAILY WITH BREAKFAST 180 tablet 0   glucose blood (ACCU-CHEK GUIDE) test strip USE 1 ONCE DAILY TO TEST BLOOD SUGAR 100 each 0   HYDROcodone-acetaminophen (NORCO) 5-325 MG tablet Take 1-2 tablets by mouth every 6 (six) hours as needed for moderate pain or severe pain. 30 tablet 0   insulin degludec (TRESIBA) 200 UNIT/ML FlexTouch Pen Inject 60-80 Units into the skin in the morning. (Patient taking differently: Inject 80 Units into the skin in the morning.) 4 mL 11   Insulin Pen Needle (NOVOFINE PLUS PEN NEEDLE) 32G X 4 MM MISC 1 each by Does not apply route daily. 30 each 5   Lancets Misc. (ACCU-CHEK FASTCLIX LANCET) KIT Use to test blood sugars daily. Dx: E11.9 1 kit 5   latanoprost (XALATAN) 0.005 % ophthalmic  solution Place 1 drop into the right eye at bedtime.      lisinopril (ZESTRIL) 10 MG tablet Take 1 tablet (10 mg total) by mouth daily. 90 tablet 3   lovastatin (MEVACOR) 10 MG tablet Take 1 tablet (10 mg total) by mouth daily. 90 tablet 3   Misc Natural Products (NEURIVA PO) Take 1 capsule by mouth daily.     Multiple Vitamin (MULTIVITAMIN WITH MINERALS) TABS tablet Take 1 tablet by mouth daily. 3 Fruit Capsules 3 Vegetable Capsules -  Balance of Nature 30 tablet 0   pantoprazole (PROTONIX) 40 MG tablet Take 1 tablet (40 mg total) by mouth daily. Take 1 hour around food. 30 tablet 3   pramipexole (MIRAPEX) 0.5 MG tablet TAKE 1 TABLET BY MOUTH AT BEDTIME (Patient taking differently: Take 0.5 mg by mouth at bedtime.) 90 tablet 0   Respiratory Therapy Supplies (FLUTTER) DEVI Use as directed 1 each 0   ROCKLATAN 0.02-0.005 % SOLN Place 1 drop into the left eye at bedtime.      Selenium 200 MCG CAPS Take 200 mcg by mouth at bedtime.      temazepam (RESTORIL) 15 MG capsule TAKE 1 CAPSULE BY MOUTH AT BEDTIME AS NEEDED FOR SLEEP (Patient taking differently: Take 15 mg by mouth at bedtime.) 30 capsule 5   UNABLE TO FIND Take 2 tablets by mouth at bedtime. Fiber therapy     vitamin B-12 (CYANOCOBALAMIN) 1000 MCG tablet Take 1,000 mcg by mouth daily.     sulfamethoxazole-trimethoprim (BACTRIM) 400-80 MG tablet Take 1 tablet by mouth daily. 90 tablet 1   No facility-administered medications prior to visit.     Review of Systems:   Constitutional: No weight loss or gain, night sweats, fevers, chills. +fatigue, generalized weakness HEENT: No headaches, difficulty swallowing, tooth/dental problems, or sore throat. No sneezing, itching, ear ache. +nasal congestion, clear rhinorrhea, postnasal drip CV:  No chest pain, orthopnea, PND, swelling in lower extremities, anasarca, dizziness, palpitations, syncope Resp: +shortness of breath with exertion when off O2 therapy (significant improvement per pt). No excess  mucus or change in color of mucus. No productive or non-productive. No hemoptysis. No wheezing.  No chest wall deformity GI:  No heartburn, indigestion, abdominal pain, nausea, vomiting, diarrhea, change in bowel habits, loss of appetite, bloody stools.  GU: No dysuria, change in color of urine, urgency or frequency.  No flank pain, no hematuria  Skin: No rash, lesions, ulcerations MSK:  +R knee effusion, improved (follow up with ortho).  No back pain. Neuro: No dizziness or lightheadedness.  Psych: No depression or anxiety. Mood stable.     Physical Exam:  BP 122/68    Pulse 85    Ht 6' (1.829 m)    LMP 05/10/1992 Comment: partial   SpO2 94% Comment: on 1L   BMI 36.56 kg/m   GEN: Pleasant, interactive, chronically-ill appearing; obese; in no acute distress. HEENT:  Normocephalic and atraumatic. EACs patent bilaterally. TM pearly gray with present light reflex bilaterally. PERRLA. Sclera white. Nasal turbinates pink, moist and patent bilaterally. No rhinorrhea present. Oropharynx erythematous and moist, without exudate or edema. No lesions, ulcerations. NECK:  Supple w/ fair ROM. No JVD present. Normal carotid impulses w/o bruits. Thyroid symmetrical with no goiter or nodules palpated. No lymphadenopathy.   CV: RRR, no m/r/g, no peripheral edema. Pulses intact, +2 bilaterally. No cyanosis, pallor or clubbing. PULMONARY:  Unlabored, regular breathing. Clear bilaterally A&P w/o wheezes/rales/rhonchi. No accessory muscle use. No dullness to percussion. GI: BS present and normoactive. Soft, non-tender to palpation. No organomegaly or masses detected. No CVA tenderness. MSK: No erythema, warmth or tenderness. Cap refil <2 sec all extrem. No deformities or joint swelling noted.  Neuro: A/Ox3. No focal deficits noted.   Skin: Warm, no lesions or rashe Psych: Normal affect and behavior. Judgement and thought content appropriate.     Lab Results:  CBC    Component Value Date/Time   WBC 8.4  03/22/2021 0336   RBC 3.57 (L) 03/22/2021 0336   HGB 10.6 (  L) 03/22/2021 0336   HGB 14.4 07/16/2019 1327   HCT 33.9 (L) 03/22/2021 0336   HCT 44.2 07/16/2019 1327   PLT 211 03/22/2021 0336   PLT 306 07/16/2019 1327   MCV 95.0 03/22/2021 0336   MCV 95 07/16/2019 1327   MCH 29.7 03/22/2021 0336   MCHC 31.3 03/22/2021 0336   RDW 14.5 03/22/2021 0336   RDW 13.8 07/16/2019 1327   LYMPHSABS 1.3 12/18/2020 1422   LYMPHSABS 2.3 07/16/2019 1327   MONOABS 0.7 12/18/2020 1422   EOSABS 0.3 12/18/2020 1422   EOSABS 0.5 (H) 07/16/2019 1327   BASOSABS 0.0 12/18/2020 1422   BASOSABS 0.1 07/16/2019 1327    BMET    Component Value Date/Time   NA 140 03/22/2021 0336   K 3.6 03/22/2021 0336   CL 102 03/22/2021 0336   CO2 34 (H) 03/22/2021 0336   GLUCOSE 163 (H) 03/22/2021 0336   BUN 16 03/22/2021 0336   CREATININE 0.74 03/22/2021 0336   CREATININE 0.68 10/29/2020 1451   CALCIUM 8.2 (L) 03/22/2021 0336   GFRNONAA >60 03/22/2021 0336   GFRNONAA 83 10/29/2020 1451   GFRAA 96 10/29/2020 1451    BNP No results found for: BNP   Imaging:  04/22/2021: CXR reviewed by me. New small bilat pelural effusions; mild diffuse interstitial pulmonary opacity reflective of infection vs edema. No s/s of pulm edema or fluid overload on exam.  DG Chest 2 View  Result Date: 04/21/2021 CLINICAL DATA:  Follow-up pneumonia EXAM: CHEST - 2 VIEW COMPARISON:  03/21/2021 FINDINGS: The heart size and mediastinal contours are within normal limits. New small bilateral pleural effusions and mild, diffuse interstitial pulmonary opacity. Disc degenerative disease of the thoracic spine. Status post left shoulder arthroplasty. IMPRESSION: New small bilateral pleural effusions and mild, diffuse interstitial pulmonary opacity, may reflect edema or infection. Electronically Signed   By: Jearld Lesch M.D.   On: 04/21/2021 12:23   MR KNEE RIGHT WO CONTRAST  Result Date: 03/24/2021 CLINICAL DATA:  Knee replacement,  periprosthetic fracture suspected EXAM: MRI OF THE RIGHT KNEE WITHOUT CONTRAST TECHNIQUE: Multiplanar, multisequence MR imaging of the knee was performed. No intravenous contrast was administered. COMPARISON:  Right knee radiograph 03/21/2021 FINDINGS: Bones/Joint/Cartilage Total knee arthroplasty with associated susceptibility artifact. There is a moderate-sized joint effusion, with low signal intensity internal debris largest measuring up to 8 mm (axial stir image 8-10 as well as coronal stir images 17-19). There is no significant marrow signal abnormality. There is linear low intensity signal on the coronal T1 images extending from the femoral stem proximally, without any associated bony edema signal which suggests this is a chronic finding. There is no evidence of acute fracture. Ligaments Grossly intact. Muscles and Tendons Generalized mild to moderate muscle atrophy. Soft tissues Soft tissue swelling along the knee. IMPRESSION: Total knee arthroplasty with associated susceptibility artifact. Moderate-sized joint effusion with low signal intensity internal debris of unclear origin/etiology, largest measuring up to 8 mm, which could potentially represent gas if recently aspirated, artificial debris, blood products, or potentially bony fragments although this is not visible on prior radiograph. No obvious acute osseous or ligamentous injury given anatomic distortion from susceptibility artifact. Electronically Signed   By: Caprice Renshaw M.D.   On: 03/24/2021 08:36      PFT Results Latest Ref Rng & Units 12/02/2015  FVC-Pre L 2.52  FVC-Predicted Pre % 67  FVC-Post L 2.63  FVC-Predicted Post % 70  Pre FEV1/FVC % % 77  Post FEV1/FCV % % 77  FEV1-Pre L 1.94  FEV1-Predicted Pre % 68  FEV1-Post L 2.02  DLCO uncorrected ml/min/mmHg 25.72  DLCO UNC% % 74  DLCO corrected ml/min/mmHg 25.96  DLCO COR %Predicted % 75  DLVA Predicted % 94  TLC L 5.02  TLC % Predicted % 81  RV % Predicted % 91    No  results found for: NITRICOXIDE      Assessment & Plan:   Acute respiratory failure with hypoxia (HCC) Improved; however, still requiring 1-3 lpm [place] supplemental O2. Unable to perform walking oximetry today d/t weakness. Qualified for POC in wheelchair 2-3lpm. CXR for PNA follow up. Continue working with PT at home.   Patient Instructions  -Continue Symbicort 80 2 puffs Twice daily, brush tongue and rinse mouth afterwards  -Continue Zyrtec (cetirizine) 10 mg daily  -Continue Protonix 40 mg daily -Continue supplemental O2 1-3 lpm for oxygen saturation goal >88-90%.   -Singulair (montelukast) 10 mg At bedtime for asthma and allergies  -Flonase nasal spray 1-2 sprays each nostril daily -Albuterol inhaler 2 puffs or 3 mL nebulizer every 6 hours as needed for shortness of breath or wheezing   Rinse mouth after inhaled corticosteroid use.  Avoid triggers, when able.  Exercise encouraged. Notify if worsening symptoms upon exertion. Continue working with PT at home.  Notify and seek help if symptoms unrelieved by rescue inhaler.   Chest x ray today. We will notify you of results.   Pulmonary function testing scheduled. Follow up afterwards.   Qualified for POC on 2-3lpm in wheelchair. Order for POC placed. Unable to complete walking oximetry due to generalized weakness. PT is very important for this!   Incentive spirometer 10x an hour, while awake. Continue flutter valve 10x/hr while awake.   Follow up with Dr. Brock Ra after PFTs. If symptoms do not improve or worsen, please contact office for sooner follow up or seek emergency care.   Bronchiectasis ([place]) Breathing stable post hospitalization. Rx rescue inhaler and PRN albuterol neb. Continue flutter valve. Repeat PFTs - former mixed disease with hx of asthma. Will consider repeating CT chest after. See above plan.  Tracheomalacia Persistent hoarseness. Possible upper airway irritation from postnasal drip contributing. Continued  Zyrtec. Rx for flonase and singulair. Eos 300 on prev CBC with diff and hx of asthma. See above plan.  GERD Controlled. Continue protonix  Hospital discharge follow-up Continue supplemental O2. Qualified for POC today 2-3 lpm. Repeat CXR today for CAP f/u. See above plan.  Community acquired pneumonia CXR showed diffuse interstitial pulmonary opacity reflecting infection as well as bilateral small pleural effusions. Expand rx coverage due to recent hospitalization and abx therapy. Allergy to erythromycin and levaquin. Augmentin 875 bid for 7 days and doxycycline 100 mg bid for 7 days. Take with food. Avoid excessive sunlight exposure due to increased photosensitivity and hyperpigmentation. Take OTC probiotic daily while on abx therapy. Close follow up. See above plan.     Clayton Bibles, NP 04/22/2021  Pt aware and understands NP's role.

## 2021-04-21 NOTE — Assessment & Plan Note (Addendum)
Breathing stable post hospitalization. Rx rescue inhaler and PRN albuterol neb. Continue flutter valve. Repeat PFTs - former mixed disease with hx of asthma. Will consider repeating CT chest after. See above plan.

## 2021-04-21 NOTE — Patient Instructions (Addendum)
-  Continue Symbicort 80 2 puffs Twice daily, brush tongue and rinse mouth afterwards  -Continue Zyrtec (cetirizine) 10 mg daily  -Continue Protonix 40 mg daily -Continue supplemental O2 1-3 lpm for oxygen saturation goal >88-90%.   -Singulair (montelukast) 10 mg At bedtime for asthma and allergies  -Flonase nasal spray 1-2 sprays each nostril daily -Albuterol inhaler 2 puffs or 3 mL nebulizer every 6 hours as needed for shortness of breath or wheezing   Rinse mouth after inhaled corticosteroid use.  Avoid triggers, when able.  Exercise encouraged. Notify if worsening symptoms upon exertion. Continue working with PT at home.  Notify and seek help if symptoms unrelieved by rescue inhaler.   Chest x ray today. We will notify you of results.   Pulmonary function testing scheduled. Follow up afterwards.   Qualified for POC on 2-3lpm in wheelchair. Order for POC placed. Unable to complete walking oximetry due to generalized weakness. PT is very important for this!   Incentive spirometer 10x an hour, while awake. Continue flutter valve 10x/hr while awake.   Follow up with Dr. Brock Ra after PFTs. If symptoms do not improve or worsen, please contact office for sooner follow up or seek emergency care.

## 2021-04-21 NOTE — Assessment & Plan Note (Signed)
Continue supplemental O2. Qualified for POC today 2-3 lpm. Repeat CXR today for CAP f/u. See above plan.

## 2021-04-21 NOTE — Addendum Note (Signed)
Addended by: Valerie Salts on: 04/21/2021 12:30 PM   Modules accepted: Orders

## 2021-04-21 NOTE — Assessment & Plan Note (Addendum)
Persistent hoarseness. Possible upper airway irritation from postnasal drip contributing. Continued Zyrtec. Rx for flonase and singulair. Eos 300 on prev CBC with diff and hx of asthma. See above plan.

## 2021-04-21 NOTE — Assessment & Plan Note (Addendum)
Improved; however, still requiring 1-3 lpm [place] supplemental O2. Unable to perform walking oximetry today d/t weakness. Qualified for POC in wheelchair 2-3lpm. CXR for PNA follow up. Continue working with PT at home.   Patient Instructions  -Continue Symbicort 80 2 puffs Twice daily, brush tongue and rinse mouth afterwards  -Continue Zyrtec (cetirizine) 10 mg daily  -Continue Protonix 40 mg daily -Continue supplemental O2 1-3 lpm for oxygen saturation goal >88-90%.   -Singulair (montelukast) 10 mg At bedtime for asthma and allergies  -Flonase nasal spray 1-2 sprays each nostril daily -Albuterol inhaler 2 puffs or 3 mL nebulizer every 6 hours as needed for shortness of breath or wheezing   Rinse mouth after inhaled corticosteroid use.  Avoid triggers, when able.  Exercise encouraged. Notify if worsening symptoms upon exertion. Continue working with PT at home.  Notify and seek help if symptoms unrelieved by rescue inhaler.   Chest x ray today. We will notify you of results.   Pulmonary function testing scheduled. Follow up afterwards.   Qualified for POC on 2-3lpm in wheelchair. Order for POC placed. Unable to complete walking oximetry due to generalized weakness. PT is very important for this!   Incentive spirometer 10x an hour, while awake. Continue flutter valve 10x/hr while awake.   Follow up with Dr. Brock Ra after PFTs. If symptoms do not improve or worsen, please contact office for sooner follow up or seek emergency care.

## 2021-04-21 NOTE — Assessment & Plan Note (Signed)
Controlled. Continue protonix

## 2021-04-22 MED ORDER — AMOXICILLIN-POT CLAVULANATE 875-125 MG PO TABS: 1.0000 | ORAL_TABLET | Freq: Two times a day (BID) | ORAL | 0 refills | Status: DC

## 2021-04-22 MED ORDER — DOXYCYCLINE HYCLATE 100 MG PO TABS: 100.0000 mg | ORAL_TABLET | Freq: Two times a day (BID) | ORAL | 0 refills | Status: DC

## 2021-04-22 NOTE — Progress Notes (Signed)
Spoke with pt  and spouse and they both voice understanding. Pt did not have any further questions. Per Joellen Jersey I told them that  she would need to take a probiotic while on the ABT. Nothing further needed. Future OV made and patient aware if not  better to call the office to come in person.

## 2021-04-22 NOTE — Progress Notes (Signed)
Canceled azithromycin and changed to doxycycline twice daily for 7 days as pt has allergy to erythromycin. Take both abx with food. OTC probiotic daily.

## 2021-04-22 NOTE — Assessment & Plan Note (Signed)
CXR showed diffuse interstitial pulmonary opacity reflecting infection as well as bilateral small pleural effusions. Expand rx coverage due to recent hospitalization and abx therapy. Allergy to erythromycin and levaquin. Augmentin 875 bid for 7 days and doxycycline 100 mg bid for 7 days. Take with food. Avoid excessive sunlight exposure due to increased photosensitivity and hyperpigmentation. Take OTC probiotic daily while on abx therapy. Close follow up. See above plan.

## 2021-04-22 NOTE — Addendum Note (Signed)
Addended by: Clayton Bibles on: 04/22/2021 09:54 AM   Modules accepted: Orders

## 2021-04-23 DIAGNOSIS — J45909 Unspecified asthma, uncomplicated: Secondary | ICD-10-CM | POA: Diagnosis not present

## 2021-04-23 DIAGNOSIS — M179 Osteoarthritis of knee, unspecified: Secondary | ICD-10-CM | POA: Diagnosis not present

## 2021-04-23 DIAGNOSIS — J9601 Acute respiratory failure with hypoxia: Secondary | ICD-10-CM | POA: Diagnosis not present

## 2021-04-23 DIAGNOSIS — J479 Bronchiectasis, uncomplicated: Secondary | ICD-10-CM | POA: Diagnosis not present

## 2021-04-23 DIAGNOSIS — S42309A Unspecified fracture of shaft of humerus, unspecified arm, initial encounter for closed fracture: Secondary | ICD-10-CM | POA: Diagnosis not present

## 2021-04-23 DIAGNOSIS — J4520 Mild intermittent asthma, uncomplicated: Secondary | ICD-10-CM | POA: Diagnosis not present

## 2021-04-24 ENCOUNTER — Telehealth: Payer: Self-pay | Admitting: Family Medicine

## 2021-04-24 ENCOUNTER — Ambulatory Visit: Payer: Medicare PPO | Admitting: Family Medicine

## 2021-04-24 DIAGNOSIS — J9601 Acute respiratory failure with hypoxia: Secondary | ICD-10-CM

## 2021-04-24 DIAGNOSIS — R030 Elevated blood-pressure reading, without diagnosis of hypertension: Secondary | ICD-10-CM

## 2021-04-24 DIAGNOSIS — F3342 Major depressive disorder, recurrent, in full remission: Secondary | ICD-10-CM

## 2021-04-24 DIAGNOSIS — E119 Type 2 diabetes mellitus without complications: Secondary | ICD-10-CM

## 2021-04-24 NOTE — Telephone Encounter (Signed)
Yes thanks 

## 2021-04-24 NOTE — Telephone Encounter (Signed)
Gracie from [facility] called for verbal orders for home health physical therapy   One time a week for two weeks  Two times a week for four week  One time a week for two weeks   Good callback number is [phone] Faythe Ghee to leave a voicemail     Please advise

## 2021-04-25 ENCOUNTER — Other Ambulatory Visit: Payer: Self-pay | Admitting: Family Medicine

## 2021-04-27 NOTE — Telephone Encounter (Signed)
Left a message for Gracie to call us back regarding the verbal order. Her VM wasn't identified, I didn't leave any patient's information.

## 2021-04-28 NOTE — Telephone Encounter (Signed)
Left message for [name] to return call to office.

## 2021-04-28 NOTE — Telephone Encounter (Signed)
Returned [name]'s call for verbal order. No answer LMTCB to call back.

## 2021-04-28 NOTE — Telephone Encounter (Signed)
Physical Therapist called back.  I gave her Dr. Vickey Sages.

## 2021-04-29 ENCOUNTER — Telehealth (INDEPENDENT_AMBULATORY_CARE_PROVIDER_SITE_OTHER): Payer: Medicare PPO | Admitting: Nurse Practitioner

## 2021-04-29 ENCOUNTER — Other Ambulatory Visit: Payer: Self-pay

## 2021-04-29 ENCOUNTER — Other Ambulatory Visit: Payer: Self-pay | Admitting: Family Medicine

## 2021-04-29 ENCOUNTER — Encounter: Payer: Self-pay | Admitting: Nurse Practitioner

## 2021-04-29 DIAGNOSIS — R490 Dysphonia: Secondary | ICD-10-CM | POA: Diagnosis not present

## 2021-04-29 DIAGNOSIS — J479 Bronchiectasis, uncomplicated: Secondary | ICD-10-CM

## 2021-04-29 DIAGNOSIS — J189 Pneumonia, unspecified organism: Secondary | ICD-10-CM | POA: Diagnosis not present

## 2021-04-29 DIAGNOSIS — G9331 Postviral fatigue syndrome: Secondary | ICD-10-CM

## 2021-04-29 DIAGNOSIS — J398 Other specified diseases of upper respiratory tract: Secondary | ICD-10-CM | POA: Diagnosis not present

## 2021-04-29 DIAGNOSIS — R5383 Other fatigue: Secondary | ICD-10-CM | POA: Insufficient documentation

## 2021-04-29 DIAGNOSIS — J9611 Chronic respiratory failure with hypoxia: Secondary | ICD-10-CM | POA: Diagnosis not present

## 2021-04-29 MED ORDER — SPACER/AERO-HOLDING CHAMBERS DEVI: 2 refills | Status: DC

## 2021-04-29 NOTE — Assessment & Plan Note (Signed)
Continue mucolytic agents and flutter valve, IS, and physical therapy. Continue Symbicort. PFTs scheduled for January with follow up afterwards. See above.

## 2021-04-29 NOTE — Assessment & Plan Note (Signed)
Continue oxygen therapy at 1-3 lpm. Primarily utilizing 1-1.5 lpm. Notify of increasing needs. See above.

## 2021-04-29 NOTE — Assessment & Plan Note (Signed)
Persistent hoarseness. Allergy symptoms improved with Flonase and Singulair. Will add spacer for inhaler therapy to reduce possible upper airway irritation. See above.

## 2021-04-29 NOTE — Assessment & Plan Note (Signed)
Suspect likely related to deconditioning, recent hospitalization, and recent pneumonia. Advised to continue with PT and activity as tolerated. Will follow up at next visit and determine if further evaluation necessary, such as split night sleep study.

## 2021-04-29 NOTE — Progress Notes (Signed)
Patient ID: [name], female     DOB: August 09, 1940, 80 y.o.      MRN: [id number]  Chief Complaint  Patient presents with   Follow-up    Feels like she is improving, sleepy all the time. 1 - 1.5L     Virtual Visit via Video Note  I connected with [name] on 04/29/21 at  2:00 PM EST by a telephone enabled telemedicine application and verified that I am speaking with the correct person using two identifiers.  Location: Patient: Home Provider: Office   I discussed the limitations of evaluation and management by telemedicine and the availability of in person appointments. The patient expressed understanding and agreed to proceed.  History of Present Illness: 80 year old female, never smoker followed for bronchiectasis, tracheomalacia and asthma. She is a patient of Dr. Agustina Caroli and was last seen in office on 04/21/2021 by [facility] NP. Past medical history significant for breast cancer, diabetes, HTN, HLD, endometriosis, gastritis, and esophageal dysplasia.   03/21/2021-03/26/2921: Hospital admission for acute respiratory failure r/t CAP and recent fall with effusion of right knee. Recent anal fissure surgical repair with urinary retention requiring foley catheter. Placed on prophylactic bactrim. Completed abx for CAP during hospitalization. Knee effusion aspiration with ortho follow up. Discharged on 1L [place] to rehab (required 1-3 lpm at rehab depending on activity).  04/21/2021: OV with Nysir Fergusson,NP. Improved breathing since hospitalization. Only SOB with exertion if not wearing oxygen; however, not doing much as far as mobility - scheduled to start PT at home. Stated her breathing is better on Symbicort. Some allergy symptoms and persistent hoarseness. Sats maintaining >90% on 1-3 lpm (1L in office). Qualified for POC in wheelchair; unable to walk to perform walking oximetry due to generalized weakness. CXR with new small bilateral pleural effusions and mild diffuse interstitial pulmonary  opacity reflective of infection - tx with augmentin and doxy for 7 days due to recent abx use and hospitalization (allergy to levaquin). Added Singulair 10 mg qhs, flonase daily, and albuterol nebs as needed. Close follow up.  04/29/2021: Today - follow up visit Attempted several times to perform visit with video but patient was unable to connect Patient presents today for follow up after pneumonia diagnosis one week ago. She reports feeling much better with stability in her breathing. She does feel a little more fatigued after leaving the hospital and rehab. She is getting 6-8 hours of sleep a night and denies snoring; she usually sleeps in a recliner. She experiences shortness of breath upon exertion on occasion, but reports her breathing is usually fine and she is just limited by weakness. She did work with OT at home and when mobilizing, desatted into the 80's on room air but recovered with rest and applying her oxygen. She continues on this at 1-1.5 lpm. Her nasal congestion and rhinorrhea have also improved with initiation of singulair and flonase therapies. She denies cough, fevers, chills, body aches, orthopnea, PND, chest pain, or lower extremity swelling. She continues on her Symbicort inhaler Twice daily and rarely uses her albuterol inhaler. She finished her augmentin and doxy courses from [name] but her augmentin was continued by her PCP due to a UTI. Overall, she feels well and offers no further complaints.   Allergies  Allergen Reactions   Nitrofurantoin Other (See Comments)    Severe headache HEADACHE   Levaquin [Levofloxacin In D5w] Other (See Comments)    Pain in tendons    Brimonidine Other (See Comments)  Made eyes and surrounding areas RED/ was an eye drop   Cephalexin Diarrhea and Other (See Comments)    Patient can't remember reaction (per chart at [facility] states diarrhea) (tolerates Augmentin fine)   Erythromycin Ethylsuccinate Hives and Diarrhea   Oxycodone Itching    Immunization History  Administered Date(s) Administered   Fluad Quad(high Dose 65+) 02/22/2019, 02/20/2020   H1N1 06/05/2008   Influenza Split 02/07/2012   Influenza Whole 03/10/2000, 01/24/2008, 03/10/2009, 01/20/2010   Influenza, High Dose Seasonal PF 02/12/2013, 01/31/2017, 01/19/2018   Influenza,inj,Quad PF,6+ Mos 01/17/2014, 03/27/2015   Influenza,inj,quad, With Preservative 02/07/2017   Influenza-Unspecified 02/09/2016   PFIZER(Purple Top)SARS-COV-2 Vaccination 06/03/2019, 06/25/2019, 04/24/2020   Pneumococcal Conjugate-13 01/17/2014   Pneumococcal Polysaccharide-23 03/10/2000, 05/02/2007, 01/02/2012   Td 05/10/1992, 12/25/2007   Zoster Recombinat (Shingrix) 02/24/2018, 04/26/2018   Past Medical History:  Diagnosis Date   Allergy    Anemia    Anxiety    Arthritis    right knee;injections every 77months    Asthma    use daily   Breast cancer ([place]) 1994/1995   HX BREAST CANCER/ right brreast and left breast in 1995   Bronchiectasis ([place])    COMPRESSION FRACTURE, LUMBAR VERTEBRAE 08/21/2008   Qualifier: Diagnosis of  By: Arnoldo Morale MD, Balinda Quails    Depression    Diabetes mellitus    takes Amaryl and Januvia daily   Diverticulitis    Dyspnea    with activity   Early cataracts, bilateral    Eczema    Endometriosis    Fatty liver    Gastritis    Glaucoma    Hammer toe    History of kidney stones    Hyperlipidemia associated with type 2 diabetes mellitus ([place]) 07/26/2007   Diet/exercise control.     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 8 g 2   amoxicillin-clavulanate (AUGMENTIN) 875-125 MG tablet Take 1 tablet by mouth 2 (two) times daily. 14 tablet 0   atenolol (TENORMIN) 25 MG tablet Take 1 tablet by mouth once daily (Patient taking differently: Take 25 mg by mouth  daily.) 90 tablet 3   Bacillus Coagulans-Inulin (ALIGN PREBIOTIC-PROBIOTIC PO) Take 1 capsule by mouth daily.      blood glucose meter kit and supplies KIT Dispense based on patient and insurance preference. Use up to four times daily as directed. Dx E11.9 1 each 0   budesonide-formoterol (SYMBICORT) 80-4.5 MCG/ACT inhaler Inhale 2 puffs into the lungs 2 (two) times daily. 1 each 3   cetirizine (ZYRTEC) 10 MG tablet Take 10 mg by mouth every morning.      Cholecalciferol (VITAMIN D3) 125 MCG (5000 UT) TABS Take 5,000 Units by mouth daily.     Coenzyme Q10 (COQ10) 200 MG CAPS Take 200 mg by mouth daily.     colesevelam (WELCHOL) 625 MG tablet TAKE 1 TABLET BY MOUTH ONCE DAILY IN THE MORNING AND AT NOON AND AT BEDTIME (Patient taking differently: Take 625 mg by mouth 3 (three) times daily.) 90 tablet 3   desvenlafaxine (PRISTIQ) 50 MG 24 hr tablet Take 1 tablet (50 mg total) by mouth daily. 90 tablet 3   dicyclomine (BENTYL) 20 MG tablet Take 1 tablet (20 mg  total) by mouth in the morning, at noon, in the evening, and at bedtime. 120 tablet 5   dorzolamide-timolol (COSOPT) 22.3-6.8 MG/ML ophthalmic solution Place 1 drop into both eyes 2 (two) times daily.      doxycycline (VIBRA-TABS) 100 MG tablet Take 1 tablet (100 mg total) by mouth 2 (two) times daily. 14 tablet 0   ESTRING 2 MG vaginal ring INSERT 1 RING  INTO VAGINA EVERY 3 MONTHS 1 each 4   FIBER PO Take 2 tablets by mouth every evening.     fluticasone (FLONASE) 50 MCG/ACT nasal spray Place 1 spray into both nostrils daily. 18.2 mL 2   gabapentin (NEURONTIN) 100 MG capsule TAKE 1 CAPSULE BY MOUTH THREE TIMES DAILY AS NEEDED FOR  NEUROPATHIC  PAIN. (Patient taking differently: Take 200 mg by mouth 2 (two) times daily.) 270 capsule 3   glimepiride (AMARYL) 4 MG tablet TAKE 2 TABLETS BY MOUTH ONCE DAILY WITH BREAKFAST 180 tablet 0   glucose blood (ACCU-CHEK GUIDE) test strip USE 1 ONCE DAILY TO TEST BLOOD SUGAR 100 each 0   HYDROcodone-acetaminophen (NORCO) 5-325 MG tablet Take 1-2 tablets by mouth every 6 (six) hours as needed for moderate pain or severe pain. 30 tablet 0   insulin degludec (TRESIBA) 200 UNIT/ML FlexTouch Pen Inject 60-80 Units into the skin in the morning. (Patient taking differently: Inject 80 Units into the skin in the morning.) 4 mL 11   Insulin Pen Needle (NOVOFINE PLUS PEN NEEDLE) 32G X 4 MM MISC 1 each by Does not apply route daily. 30 each 5   Lancets Misc. (ACCU-CHEK FASTCLIX LANCET) KIT Use to test blood sugars daily. Dx: E11.9 1 kit 5   latanoprost (XALATAN) 0.005 % ophthalmic solution Place 1 drop into the right eye at bedtime.      lisinopril (ZESTRIL) 10 MG tablet Take 1 tablet (10 mg total) by mouth daily. 90 tablet 3   lovastatin (MEVACOR) 10 MG tablet Take 1 tablet (10 mg total) by mouth daily. 90 tablet 3   Misc Natural Products (NEURIVA PO) Take 1 capsule by mouth daily.     montelukast (SINGULAIR) 10 MG tablet Take 1 tablet (10 mg total) by mouth at bedtime. 30  tablet 11   Multiple Vitamin (MULTIVITAMIN WITH MINERALS) TABS tablet Take 1 tablet by mouth daily. 3 Fruit Capsules 3 Vegetable Capsules - Balance of Petra Kuba  30 tablet 0   pramipexole (MIRAPEX) 0.5 MG tablet TAKE 1 TABLET BY MOUTH AT BEDTIME 90 tablet 0   ROCKLATAN 0.02-0.005 % SOLN Place 1 drop into the left eye at bedtime.      Selenium 200 MCG CAPS Take 200 mcg by mouth at bedtime.      temazepam (RESTORIL) 15 MG capsule TAKE 1 CAPSULE BY MOUTH AT BEDTIME AS NEEDED FOR SLEEP (Patient taking differently: Take 15 mg by mouth at bedtime.) 30 capsule 5   UNABLE TO FIND Take 2 tablets by mouth at bedtime. Fiber therapy     vitamin B-12 (CYANOCOBALAMIN) 1000 MCG tablet Take 1,000 mcg by mouth daily.     albuterol (PROVENTIL) (2.5 MG/3ML) 0.083% nebulizer solution Take 3 mLs (2.5 mg total) by nebulization every 6 (six) hours as needed for wheezing or shortness of breath. (Patient not taking: Reported on 04/29/2021) 75 mL 12   pantoprazole (PROTONIX) 40 MG tablet Take 1 tablet (40 mg total) by mouth daily. Take 1 hour around food. (Patient not taking: Reported on 04/29/2021) 30 tablet 3   Respiratory Therapy Supplies (FLUTTER) DEVI Use as directed (Patient not taking: Reported on 04/29/2021) 1 each 0   No facility-administered medications prior to visit.     Review of Systems:   Constitutional: No weight loss or gain, night sweats, fevers, chills. +fatigue HEENT: No headaches, difficulty swallowing, tooth/dental problems, or sore throat. No sneezing, itching, ear ache, nasal congestion, or post nasal drip CV:  No chest pain, orthopnea, PND, swelling in lower extremities, anasarca, dizziness, palpitations, syncope Resp: +shortness of breath with exertion (occasionally; usually only when oxygen is not on). No excess mucus or change in color of mucus. No productive or non-productive. No hemoptysis. No wheezing.  No chest wall deformity GI:  No heartburn, indigestion, abdominal pain, nausea, vomiting,  diarrhea, change in bowel habits, loss of appetite, bloody stools.  GU: No dysuria, change in color of urine, urgency or frequency.  No flank pain, no hematuria  Skin: No rash, lesions, ulcerations MSK:  No joint pain or swelling.  No decreased range of motion.  No back pain. Neuro: No dizziness or lightheadedness.  Psych: No depression or anxiety. Mood stable.   Observations/Objective: Interactive, pleasant, A&Ox3. No signs of acute distress during conversation; no shortness of breath or cough.   12/02/2015 PFTs: FVC 2.63 (70), FEV1 2.02 (71), ratio 77, RV 2.45 (91), TLC at 5.02 (81), DLCO corrected 25.96 (75) 12/04/2015 HRCT: Atherosclerosis.  Mild air trapping.  Compression of the trachea and mainstem bronchi on expiratory phase indicative of severe tracheobronchomalacia.  Few scattered areas of very mild cylindrical bronchiectasis noted, most evident in inferior segment of the lingula and in the right middle lobe.  4 mm right upper lobe nodule.  Few scattered 1 to 3 mm pulmonary nodules noted throughout the periphery of the lungs bilaterally. 01/26/2017 CT chest without contrast: Atherosclerosis.  4 mm right upper lobe pulmonary nodule unchanged.  Small right lower lobe nodule unchanged.  Airways normal 11/27/2020 echocardiogram: LVEF 60 to 65%.  G1 DD.  Trivial MV regurgitation. 03/21/2021 CXR 1 view: Mild streaky density at the left base.  No edema effusion or pneumothorax.  Mild aortic tuberosity.  Mild atelectasis.  03/21/2021 CTA chest: Inhomogeneous attenuation in the thyroid with possible low-density nodules.  Small patchy infiltrates are seen in the posterior lower lung fields suggesting atelectasis/pneumonitis.  No PE. multiple low-density foci of varying size in the liver, suggest scarring from previous intervention or inflammatory or neoplastic  process. 04/21/2021 CXR 2 view: New small bilateral pleural effusions and mild, diffuse interstitial pulmonary opacity.  Assessment and  Plan: Community acquired pneumonia Improved symptoms without new cough or fevers. Breathing stable. Completed prescribed rx course but is on extended Augmentin due to UTI. CXR for follow up at next appt.   Patient Instructions  -Continue Symbicort 80 2 puffs Twice daily, brush tongue and rinse mouth afterwards. Start using with spacer.  -Continue Zyrtec (cetirizine) 10 mg daily  -Continue Protonix 40 mg daily -Continue supplemental O2 1-3 lpm for oxygen saturation goal >88-90%.  -Continue Singulair (montelukast) 10 mg At bedtime for asthma and allergies  -Continue Flonase nasal spray 1-2 sprays each nostril daily -Continue Albuterol inhaler 2 puffs or 3 mL nebulizer every 6 hours as needed for shortness of breath or wheezing    Rinse mouth after inhaled corticosteroid use.  Avoid triggers, when able.  Exercise encouraged. Notify if worsening symptoms upon exertion. Continue working with PT at home.  Notify and seek help if symptoms unrelieved by rescue inhaler.    Pulmonary function testing scheduled. Follow up afterwards.    Incentive spirometer 10x an hour, while awake. Continue flutter valve 10x/hr while awake.    Follow up with Dr. Brock Ra, Katie Dereke Neumann,NP or APP after PFTs. If symptoms do not improve or worsen, please contact office for sooner follow up or seek emergency care.    Bronchiectasis ([place]) Continue mucolytic agents and flutter valve, IS, and physical therapy. Continue Symbicort. PFTs scheduled for January with follow up afterwards. See above.   Tracheomalacia Persistent hoarseness. Allergy symptoms improved with Flonase and Singulair. Will add spacer for inhaler therapy to reduce possible upper airway irritation. See above.   Chronic respiratory failure with hypoxia (HCC) Continue oxygen therapy at 1-3 lpm. Primarily utilizing 1-1.5 lpm. Notify of increasing needs. See above.   Fatigue Suspect likely related to deconditioning, recent hospitalization, and recent  pneumonia. Advised to continue with PT and activity as tolerated. Will follow up at next visit and determine if further evaluation necessary, such as split night sleep study.     Follow Up Instructions: Follow up after PFTs in January with Dr. Lamonte Sakai, Roxan Diesel, NP or APP. If symptoms do not improve or worsen, please contact office for sooner follow up or seek emergency care.  I discussed the assessment and treatment plan with the patient. The patient was provided an opportunity to ask questions and all were answered. The patient agreed with the plan and demonstrated an understanding of the instructions.   The patient was advised to call back or seek an in-person evaluation if the symptoms worsen or if the condition fails to improve as anticipated.  I provided 22 minutes of non-face-to-face time during this encounter.   Clayton Bibles, NP

## 2021-04-29 NOTE — Assessment & Plan Note (Signed)
Improved symptoms without new cough or fevers. Breathing stable. Completed prescribed rx course but is on extended Augmentin due to UTI. CXR for follow up at next appt.   Patient Instructions  -Continue Symbicort 80 2 puffs Twice daily, brush tongue and rinse mouth afterwards. Start using with spacer.  -Continue Zyrtec (cetirizine) 10 mg daily  -Continue Protonix 40 mg daily -Continue supplemental O2 1-3 lpm for oxygen saturation goal >88-90%.  -Continue Singulair (montelukast) 10 mg At bedtime for asthma and allergies  -Continue Flonase nasal spray 1-2 sprays each nostril daily -Continue Albuterol inhaler 2 puffs or 3 mL nebulizer every 6 hours as needed for shortness of breath or wheezing    Rinse mouth after inhaled corticosteroid use.  Avoid triggers, when able.  Exercise encouraged. Notify if worsening symptoms upon exertion. Continue working with PT at home.  Notify and seek help if symptoms unrelieved by rescue inhaler.    Pulmonary function testing scheduled. Follow up afterwards.    Incentive spirometer 10x an hour, while awake. Continue flutter valve 10x/hr while awake.    Follow up with [name], Olivia Latresa Gasser,NP or APP after PFTs. If symptoms do not improve or worsen, please contact office for sooner follow up or seek emergency care.

## 2021-04-29 NOTE — Patient Instructions (Signed)
-  Continue Symbicort 80 2 puffs Twice daily, brush tongue and rinse mouth afterwards. Start using with spacer.  -Continue Zyrtec (cetirizine) 10 mg daily  -Continue Protonix 40 mg daily -Continue supplemental O2 1-3 lpm for oxygen saturation goal >88-90%.  -Continue Singulair (montelukast) 10 mg At bedtime for asthma and allergies  -Continue Flonase nasal spray 1-2 sprays each nostril daily -Continue Albuterol inhaler 2 puffs or 3 mL nebulizer every 6 hours as needed for shortness of breath or wheezing    Rinse mouth after inhaled corticosteroid use.  Avoid triggers, when able.  Exercise encouraged. Notify if worsening symptoms upon exertion. Continue working with PT at home.  Notify and seek help if symptoms unrelieved by rescue inhaler.    Pulmonary function testing scheduled. Follow up afterwards.    Incentive spirometer 10x an hour, while awake. Continue flutter valve 10x/hr while awake.    Follow up with Dr. Brock Ra, Katie Meira Wahba,NP or APP after PFTs. If symptoms do not improve or worsen, please contact office for sooner follow up or seek emergency care.

## 2021-05-05 ENCOUNTER — Other Ambulatory Visit: Payer: Self-pay | Admitting: Gastroenterology

## 2021-05-05 NOTE — Telephone Encounter (Signed)
Patient needs office visit.  

## 2021-05-07 ENCOUNTER — Telehealth: Payer: Self-pay | Admitting: Nurse Practitioner

## 2021-05-07 NOTE — Telephone Encounter (Signed)
Went out to the lobby to speak with pt's spouse. Mr. Dehart stated that they received ONO device in the mail and he wanted to know if pt was supposed to do the test with her using the O2 or without the O2.  Checked to see if an order had been placed and no order had been placed by [facility] Spoke with [facility] about this and she also did not remember placing an order for the ONO. After conversation with [facility], went back to the lobby and told him to have pt do the ONO with her using her O2 so we can make sure that the amount that she currently is using is enough to keep her sats stable at night and he verbalized understanding. Nothing further needed.

## 2021-05-09 ENCOUNTER — Other Ambulatory Visit: Payer: Self-pay | Admitting: Nurse Practitioner

## 2021-05-09 DIAGNOSIS — J189 Pneumonia, unspecified organism: Secondary | ICD-10-CM

## 2021-05-20 ENCOUNTER — Other Ambulatory Visit: Payer: Self-pay | Admitting: Family Medicine

## 2021-05-20 ENCOUNTER — Other Ambulatory Visit: Payer: Self-pay | Admitting: Nurse Practitioner

## 2021-05-20 DIAGNOSIS — J189 Pneumonia, unspecified organism: Secondary | ICD-10-CM

## 2021-05-21 ENCOUNTER — Encounter: Payer: Self-pay | Admitting: Family Medicine

## 2021-05-21 ENCOUNTER — Other Ambulatory Visit: Payer: Self-pay

## 2021-05-21 ENCOUNTER — Telehealth (INDEPENDENT_AMBULATORY_CARE_PROVIDER_SITE_OTHER): Payer: Medicare PPO | Admitting: Family Medicine

## 2021-05-21 VITALS — Ht 72.0 in | Wt 250.0 lb

## 2021-05-21 DIAGNOSIS — N390 Urinary tract infection, site not specified: Secondary | ICD-10-CM | POA: Diagnosis not present

## 2021-05-21 DIAGNOSIS — R300 Dysuria: Secondary | ICD-10-CM

## 2021-05-21 LAB — POC URINALSYSI DIPSTICK (AUTOMATED)
Bilirubin, UA: NEGATIVE
Blood, UA: NEGATIVE
Glucose, UA: NEGATIVE
Ketones, UA: NEGATIVE
Nitrite, UA: NEGATIVE
Protein, UA: NEGATIVE
Spec Grav, UA: >=1.03 — AB (ref 1.010–1.025)
Urobilinogen, UA: 0.2 E.U./dL
pH, UA: 5.5 (ref 5.0–8.0)

## 2021-05-21 MED ORDER — AMOXICILLIN-POT CLAVULANATE 875-125 MG PO TABS: 1.0000 | ORAL_TABLET | Freq: Two times a day (BID) | ORAL | 0 refills | Status: DC

## 2021-05-21 NOTE — Patient Instructions (Signed)
Health Maintenance Due  Topic Date Due   COVID-19 Vaccine (4 - Booster for Pfizer series) 06/19/2020   INFLUENZA VACCINE  12/08/2020    Recommended follow up: No follow-ups on file.

## 2021-05-21 NOTE — Telephone Encounter (Signed)
Patient requesting call back in regard.

## 2021-05-21 NOTE — Progress Notes (Signed)
Phone [phone] Virtual visit via phonenote   Subjective:   Chief Complaint  Patient presents with   Follow-up   Dysuria    Pt c/o dysuria that started last night. She has not taken anything otc. Pt states she checked her cbg and it was 127 today.   This visit type was conducted due to national recommendations for restrictions regarding the COVID-19 Pandemic (e.g. social distancing).  This format is felt to be most appropriate for this patient at this time balancing risks to patient and risks to population by having him in for in person visit.  All issues noted in this document were discussed and addressed.  No physical exam was performed (except for noted visual exam or audio findings with Telehealth visits).  The patient has consented to conduct a Telehealth visit and understands insurance will be billed.   Our team/I connected with [name] at  4:00 PM EST by phone (patient did not have equipment for webex) and verified that I am speaking with the correct person using two identifiers.  Location patient: [facility] Location provider: [facility], office Persons participating in the virtual visit:  patient  Time on phone: 23 minutes  Our team/I discussed the limitations of evaluation and management by telemedicine and the availability of in person appointments. In light of current covid-19 pandemic, patient also understands that we are trying to protect them by minimizing in office contact if at all possible.  The patient expressed consent for telemedicine visit and agreed to proceed. Patient understands insurance will be billed.   Past Medical History-  Patient Active Problem List   Diagnosis Date Noted   Adenomatous polyp 12/20/2018    Priority: Medium    Personal history of colonic polyps 05/18/2017    Priority: Medium    Aortic atherosclerosis (HCC) 02/16/2016    Priority: Medium    Solitary pulmonary nodule 01/08/2016    Priority: Medium    IBS (irritable bowel  syndrome) 07/17/2014    Priority: Medium    Hypertension associated with diabetes (HCC) 01/03/2014    Priority: Medium    Primary open-angle glaucoma 08/02/2012    Priority: Medium    Fatty liver 03/18/2009    Priority: Medium    Hyperlipidemia associated with type 2 diabetes mellitus (HCC) 07/26/2007    Priority: Medium    ANEMIA, B12 DEFICIENCY 12/29/2006    Priority: Medium    RESTLESS LEG SYNDROME, MILD 12/28/2006    Priority: Medium    Osteoporosis 12/12/2006    Priority: Medium    History of total knee replacement, bilateral 11/14/2017    Priority: Low   Arthritis of midfoot 03/03/2016    Priority: Low   Hammertoes of both feet 03/03/2016    Priority: Low   Posterior tibial tendon dysfunction 01/03/2014    Priority: Low   Osteoarthritis, knee 01/03/2014    Priority: Low   Glaucoma 01/03/2014    Priority: Low   Obesity (BMI 30-39.9) 06/15/2013    Priority: Low   Foot tendinitis 07/20/2010    Priority: Low   INSOMNIA, CHRONIC 07/25/2009    Priority: Low   GERD 12/25/2007    Priority: Low   NEUROPATHY, IDIOPATHIC PERIPHERAL NEC 12/28/2006    Priority: Low   BREAST CANCER, HX OF 12/12/2006    Priority: Low   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 11/22/2019    Priority: High   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    Priority: High   Bronchiectasis (HCC) 10/18/2015    Priority: High  Diabetes mellitus type II, controlled ([place]) 12/28/2006    Priority: High   Recurrent major depression in full remission ([place]) 01/03/2014    Priority: Medium    Chronic respiratory failure with hypoxia ([place]) 04/29/2021   Fatigue 04/29/2021   Hospital discharge follow-up 04/21/2021   Perineal sinus 03/25/2021   Effusion of right knee 03/22/2021   Acute respiratory failure with hypoxia ([place]) 03/22/2021   Community acquired pneumonia 03/21/2021   Chronic UTI 02/23/2021   Heart palpitations 02/11/2021   Ankle edema, bilateral 02/11/2021   Hoarseness 10/17/2020   Healthcare  maintenance 02/22/2019   Physical deconditioning 02/22/2019   SOB (shortness of breath) on exertion 02/22/2019   Pain due to onychomycosis of toenails of both feet 11/08/2018   Diabetic neuropathy ([place]) 11/08/2018    Medications- reviewed and updated Current Outpatient Medications  Medication Sig Dispense Refill   ACCU-CHEK GUIDE test strip USE 1 TO TEST UP TO TWICE DAILY 200 each 0   albuterol (PROVENTIL) (2.5 MG/3ML) 0.083% nebulizer solution Take 3 mLs (2.5 mg total) by nebulization every 6 (six) hours as needed for wheezing or shortness of breath. 75 mL 12   albuterol (VENTOLIN HFA) 108 (90 Base) MCG/ACT inhaler Inhale 2 puffs into the lungs every 6 (six) hours as needed for wheezing or shortness of breath. 8 g 2   amoxicillin-clavulanate (AUGMENTIN) 875-125 MG tablet Take 1 tablet by mouth 2 (two) times daily for 7 days. 14 tablet 0   atenolol (TENORMIN) 25 MG tablet Take 1 tablet by mouth once daily (Patient taking differently: Take 25 mg by mouth daily.) 90 tablet 3   Bacillus Coagulans-Inulin (ALIGN PREBIOTIC-PROBIOTIC PO) Take 1 capsule by mouth daily.      blood glucose meter kit and supplies KIT Dispense based on patient and insurance preference. Use up to four times daily as directed. Dx E11.9 1 each 0   budesonide-formoterol (SYMBICORT) 80-4.5 MCG/ACT inhaler Inhale 2 puffs into the lungs 2 (two) times daily. 1 each 3   cetirizine (ZYRTEC) 10 MG tablet Take 10 mg by mouth every morning.      Cholecalciferol (VITAMIN D3) 125 MCG (5000 UT) TABS Take 5,000 Units by mouth daily.     Coenzyme Q10 (COQ10) 200 MG CAPS Take 200 mg by mouth daily.     colesevelam (WELCHOL) 625 MG tablet Take 1 tablet (625 mg total) by mouth 3 (three) times daily. Need office visit. 90 tablet 0   desvenlafaxine (PRISTIQ) 50 MG 24 hr tablet Take 1 tablet (50 mg total) by mouth daily. 90 tablet 3   dicyclomine (BENTYL) 20 MG tablet Take 1 tablet (20 mg total) by mouth in the morning, at noon, in the evening,  and at bedtime. 120 tablet 5   dorzolamide-timolol (COSOPT) 22.3-6.8 MG/ML ophthalmic solution Place 1 drop into both eyes 2 (two) times daily.      ESTRING 2 MG vaginal ring INSERT 1 RING  INTO VAGINA EVERY 3 MONTHS 1 each 4   FIBER PO Take 2 tablets by mouth every evening.     fluticasone (FLONASE) 50 MCG/ACT nasal spray Place 1 spray into both nostrils daily. 18.2 mL 2   gabapentin (NEURONTIN) 100 MG capsule TAKE 1 CAPSULE BY MOUTH THREE TIMES DAILY AS NEEDED FOR  NEUROPATHIC  PAIN. (Patient taking differently: Take 200 mg by mouth 2 (two) times daily.) 270 capsule 3   glimepiride (AMARYL) 4 MG tablet TAKE 2 TABLETS BY MOUTH ONCE DAILY WITH BREAKFAST 180 tablet 0   glucose blood (ACCU-CHEK GUIDE)  test strip USE  ONCE DAILY TO CHECK GLUCOSE 100 each 0   HYDROcodone-acetaminophen (NORCO) 5-325 MG tablet Take 1-2 tablets by mouth every 6 (six) hours as needed for moderate pain or severe pain. 30 tablet 0   insulin degludec (TRESIBA) 200 UNIT/ML FlexTouch Pen Inject 60-80 Units into the skin in the morning. (Patient taking differently: Inject 80 Units into the skin in the morning.) 4 mL 11   Insulin Pen Needle (NOVOFINE PLUS PEN NEEDLE) 32G X 4 MM MISC 1 each by Does not apply route daily. 30 each 5   Lancets Misc. (ACCU-CHEK FASTCLIX LANCET) KIT Use to test blood sugars daily. Dx: E11.9 1 kit 5   latanoprost (XALATAN) 0.005 % ophthalmic solution Place 1 drop into the right eye at bedtime.      lisinopril (ZESTRIL) 10 MG tablet Take 1 tablet (10 mg total) by mouth daily. 90 tablet 3   lovastatin (MEVACOR) 10 MG tablet Take 1 tablet (10 mg total) by mouth daily. 90 tablet 3   Misc Natural Products (NEURIVA PO) Take 1 capsule by mouth daily.     montelukast (SINGULAIR) 10 MG tablet Take 1 tablet (10 mg total) by mouth at bedtime. 30 tablet 11   Multiple Vitamin (MULTIVITAMIN WITH MINERALS) TABS tablet Take 1 tablet by mouth daily. 3 Fruit Capsules 3 Vegetable Capsules - Balance of Nature 30 tablet 0    pantoprazole (PROTONIX) 40 MG tablet Take 1 tablet (40 mg total) by mouth daily. Take 1 hour around food. 30 tablet 3   pramipexole (MIRAPEX) 0.5 MG tablet TAKE 1 TABLET BY MOUTH AT BEDTIME 90 tablet 0   Respiratory Therapy Supplies (FLUTTER) DEVI Use as directed 1 each 0   ROCKLATAN 0.02-0.005 % SOLN Place 1 drop into the left eye at bedtime.      Selenium 200 MCG CAPS Take 200 mcg by mouth at bedtime.      Spacer/Aero-Holding Chambers DEVI Use spacer with inhaler therapy 1 each 2   temazepam (RESTORIL) 15 MG capsule TAKE 1 CAPSULE BY MOUTH AT BEDTIME AS NEEDED FOR SLEEP (Patient taking differently: Take 15 mg by mouth at bedtime.) 30 capsule 5   UNABLE TO FIND Take 2 tablets by mouth at bedtime. Fiber therapy     vitamin B-12 (CYANOCOBALAMIN) 1000 MCG tablet Take 1,000 mcg by mouth daily.     No current facility-administered medications for this visit.     Objective:  Ht 6' (1.829 m)    Wt 250 lb (113.4 kg)    LMP 05/10/1992 Comment: partial   BMI 33.91 kg/m  self reported vitals Gen: no distress in voice    Results for orders placed or performed in visit on 05/21/21 (from the past 24 hour(s))  POCT Urinalysis Dipstick (Automated)     Status: Abnormal   Collection Time: 05/21/21  2:09 PM  Result Value Ref Range   Color, UA yellow    Clarity, UA cloudy    Glucose, UA Negative Negative   Bilirubin, UA neg    Ketones, UA neg    Spec Grav, UA >=1.030 (A) 1.010 - 1.025   Blood, UA neg    pH, UA 5.5 5.0 - 8.0   Protein, UA Negative Negative   Urobilinogen, UA 0.2 0.2 or 1.0 E.U./dL   Nitrite, UA neg    Leukocytes, UA Large (3+) (A) Negative   *Note: Due to a large number of results and/or encounters for the requested time period, some results have not been displayed. A complete  set of results can be found in Results Review.        Assessment and Plan    #social update- Exploratory surgery on november 10th with Dr. Johney Maine- was considering seeing urogyn prior to this. By the 12th  was readmitted with pneumonia with ultimate discharge to SNF. Home from nursing home for 2-3 weeks- was there 22 days. Struggling to do the home exercises recommended.   # Urinary Tract Infection (UTI)/Dysuria S: Patient complains of having severe dysuria that had started last night - stated she had left the nursing home without preventative antibiotic. Nursing home had stopped bactrim - had been on preventative Has not taken any OTC medications. Reports pain during urination and frequent urgency with nocturia as well. Patient had also checked her CBG and it was 127 today.  A/P: Ua today with 3+ leukocytes concerning  for UTI. Last 2 true UTIs were sensitive to augmentin and we opted to try this again. Urine culture is pending as she was kind enough to drop this off earlier- we will tweak if needed when results come back.   As far as preventative- did not tolerate keflex by urogyn Dr. Wannetta Sender. and later changed to bactrim by Dr. Sabra Heck her regular GYN - consider prophylactic based on results of current culture. Did tolerate bactrim but last UTI was not sensitive. Also consider nitrofurantoin or cefaclor (ceflex worsened IBS) versus seeing how she does in regard to frequency being further out from getting home from nursing home  # restless legs- try moving mirapex 0.5 mg to 2 hours before bed and can use 0.25 mg right before bed if needed (0.75 max dose)- she had been up to 1 mg - she wanted ot consider changing temezepam as well will discuss if not feeling better  Recommended follow up: 6 months at latest- likely sooner Future Appointments  Date Time Provider [place]  06/01/2021  3:00 PM [facility] None  06/01/2021  4:00 PM Cobb, Karie Schwalbe, NP [facility] None    Lab/Order associations:   ICD-10-CM   1. Chronic UTI  N39.0     2. Dysuria  R30.0 Urine Culture      Meds ordered this encounter  Medications   amoxicillin-clavulanate (AUGMENTIN) 875-125 MG tablet     Sig: Take 1 tablet by mouth 2 (two) times daily for 7 days.    Dispense:  14 tablet    Refill:  0   Return precautions advised.  [name], MD

## 2021-05-22 LAB — URINE CULTURE
MICRO NUMBER:: 12863136
SPECIMEN QUALITY:: ADEQUATE

## 2021-05-24 DIAGNOSIS — S42309A Unspecified fracture of shaft of humerus, unspecified arm, initial encounter for closed fracture: Secondary | ICD-10-CM | POA: Diagnosis not present

## 2021-05-24 DIAGNOSIS — M179 Osteoarthritis of knee, unspecified: Secondary | ICD-10-CM | POA: Diagnosis not present

## 2021-05-24 DIAGNOSIS — J9601 Acute respiratory failure with hypoxia: Secondary | ICD-10-CM | POA: Diagnosis not present

## 2021-05-24 DIAGNOSIS — J4520 Mild intermittent asthma, uncomplicated: Secondary | ICD-10-CM | POA: Diagnosis not present

## 2021-05-24 DIAGNOSIS — J45909 Unspecified asthma, uncomplicated: Secondary | ICD-10-CM | POA: Diagnosis not present

## 2021-05-24 DIAGNOSIS — J479 Bronchiectasis, uncomplicated: Secondary | ICD-10-CM | POA: Diagnosis not present

## 2021-06-01 ENCOUNTER — Ambulatory Visit: Payer: Medicare PPO | Admitting: Adult Health

## 2021-06-01 ENCOUNTER — Other Ambulatory Visit: Payer: Self-pay

## 2021-06-01 ENCOUNTER — Ambulatory Visit (INDEPENDENT_AMBULATORY_CARE_PROVIDER_SITE_OTHER): Payer: Medicare PPO

## 2021-06-01 ENCOUNTER — Ambulatory Visit (INDEPENDENT_AMBULATORY_CARE_PROVIDER_SITE_OTHER): Payer: Medicare PPO | Admitting: Emergency Medicine

## 2021-06-01 ENCOUNTER — Encounter: Payer: Self-pay | Admitting: Adult Health

## 2021-06-01 VITALS — BP 130/60 | HR 98 | Temp 98.6°F | Ht 72.5 in | Wt 252.0 lb

## 2021-06-01 DIAGNOSIS — J398 Other specified diseases of upper respiratory tract: Secondary | ICD-10-CM

## 2021-06-01 DIAGNOSIS — J189 Pneumonia, unspecified organism: Secondary | ICD-10-CM

## 2021-06-01 DIAGNOSIS — R5381 Other malaise: Secondary | ICD-10-CM | POA: Diagnosis not present

## 2021-06-01 DIAGNOSIS — R0602 Shortness of breath: Secondary | ICD-10-CM | POA: Diagnosis not present

## 2021-06-01 DIAGNOSIS — J479 Bronchiectasis, uncomplicated: Secondary | ICD-10-CM

## 2021-06-01 DIAGNOSIS — J9611 Chronic respiratory failure with hypoxia: Secondary | ICD-10-CM | POA: Diagnosis not present

## 2021-06-01 LAB — PULMONARY FUNCTION TEST
DL/VA % pred: 125 %
DL/VA: 4.85 ml/min/mmHg/L
DLCO cor % pred: 76 %
DLCO cor: 18.21 ml/min/mmHg
DLCO unc % pred: 76 %
DLCO unc: 18.21 ml/min/mmHg
FEF 25-75 Post: 1.34 L/sec
FEF 25-75 Pre: 1.09 L/sec
FEF2575-%Change-Post: 23 %
FEF2575-%Pred-Post: 73 %
FEF2575-%Pred-Pre: 59 %
FEV1-%Change-Post: 4 %
FEV1-%Pred-Post: 51 %
FEV1-%Pred-Pre: 49 %
FEV1-Post: 1.37 L
FEV1-Pre: 1.32 L
FEV1FVC-%Change-Post: -1 %
FEV1FVC-%Pred-Pre: 105 %
FEV6-%Change-Post: 5 %
FEV6-%Pred-Post: 53 %
FEV6-%Pred-Pre: 50 %
FEV6-Post: 1.79 L
FEV6-Pre: 1.7 L
FEV6FVC-%Pred-Post: 105 %
FEV6FVC-%Pred-Pre: 105 %
FVC-%Change-Post: 5 %
FVC-%Pred-Post: 50 %
FVC-%Pred-Pre: 48 %
FVC-Post: 1.79 L
FVC-Pre: 1.7 L
Post FEV1/FVC ratio: 77 %
Post FEV6/FVC ratio: 100 %
Pre FEV1/FVC ratio: 78 %
Pre FEV6/FVC Ratio: 100 %
RV % pred: 86 %
RV: 2.41 L
TLC % pred: 69 %
TLC: 4.32 L

## 2021-06-01 NOTE — Progress Notes (Signed)
Full PFT performed today. °

## 2021-06-01 NOTE — Assessment & Plan Note (Signed)
Patient has ongoing shortness of breath with activity with decreased activity tolerance.  Suspect is multifactorial and she is recovering from a severe pneumonia and hospitalization. Oxygen demands are decreasing.  We will check labs today with BNP and be met. With advance activity as tolerated.  Continue home PT.  Oxygen to maintain O2 saturations greater than 88 to 90%  Plan  Patient Instructions  Continue on Symbicort 2 puffs Twice daily, rinse after use.  Discuss with PCP that lisinopril may be aggravating your cough and hoarseness.  Albuterol inhaler As needed   Remain on Oxygen 2lm .  Chest xray and labs today .  Follow up with Dr. Lamonte Sakai  in 6-8 weeks and As needed,.  Please contact office for sooner follow up if symptoms do not improve or worsen or seek emergency care

## 2021-06-01 NOTE — Assessment & Plan Note (Signed)
Oxygen dependent respiratory failure.  Patient has been on oxygen since November when she was diagnosed with pneumonia.  Does seem to have some decreased oxygen demands.  Now is able to be off of oxygen at rest.  Patient is continue on O2 2 L to maintain O2 saturations greater than 88 to 90%.  Plan  Patient Instructions  Continue on Symbicort 2 puffs Twice daily, rinse after use.  Discuss with PCP that lisinopril may be aggravating your cough and hoarseness.  Albuterol inhaler As needed   Remain on Oxygen 2lm .  Chest xray and labs today .  Follow up with Dr. Lamonte Sakai  in 6-8 weeks and As needed,.  Please contact office for sooner follow up if symptoms do not improve or worsen or seek emergency care

## 2021-06-01 NOTE — Patient Instructions (Addendum)
Continue on Symbicort 2 puffs Twice daily, rinse after use.  Discuss with PCP that lisinopril may be aggravating your cough and hoarseness.  Albuterol inhaler As needed   Remain on Oxygen 2lm .  Chest xray and labs today .  Follow up with Dr. Lamonte Sakai  in 6-8 weeks and As needed,.  Please contact office for sooner follow up if symptoms do not improve or worsen or seek emergency care

## 2021-06-01 NOTE — Patient Instructions (Signed)
Full PFT performed today. °

## 2021-06-01 NOTE — Assessment & Plan Note (Signed)
Patient is continue on Symbicort twice daily.  Albuterol inhaler as needed. Chest x-ray today. Patient is prone to frequent flares.  Would consider changing lisinopril as may be aggravating her cough and ongoing hoarseness  Plan  Patient Instructions  Continue on Symbicort 2 puffs Twice daily, rinse after use.  Discuss with PCP that lisinopril may be aggravating your cough and hoarseness.  Albuterol inhaler As needed   Remain on Oxygen 2lm .  Chest xray and labs today .  Follow up with Dr. Lamonte Sakai  in 6-8 weeks and As needed,.  Please contact office for sooner follow up if symptoms do not improve or worsen or seek emergency care

## 2021-06-01 NOTE — Assessment & Plan Note (Signed)
Continue with home physical therapy. 

## 2021-06-01 NOTE — Assessment & Plan Note (Signed)
Clinically patient is improving.  We will check chest x-ray today for clearance.  Plan  Patient Instructions  Continue on Symbicort 2 puffs Twice daily, rinse after use.  Discuss with PCP that lisinopril may be aggravating your cough and hoarseness.  Albuterol inhaler As needed   Remain on Oxygen 2lm .  Chest xray and labs today .  Follow up with Dr. Lamonte Sakai  in 6-8 weeks and As needed,.  Please contact office for sooner follow up if symptoms do not improve or worsen or seek emergency care

## 2021-06-01 NOTE — Progress Notes (Signed)
_0  ID: [name], female    DOB: 12-23-1940, 81 y.o.   MRN: [id number]  Chief Complaint  Patient presents with   Follow-up    Referring provider: Marin Olp, MD  HPI: 81 year old female never smoker followed for bronchiectasis and tracheomalacia  TEST/EVENTS :  Pulmonary function testing 12/02/2015 , shows mixed restriction and obstruction by spirometry without a bronchodilator response, normal lung volumes and a decreased diffusion capacity that corrects to the normal range when adjusted for alveolar volume.  High-resolution CT chest December 04, 2015 showed negative for ILD.  Severe tracheomalacia, mild bronchiectasis and multiple tiny pulmonary nodules 4 mm or less  06/01/2021 Follow up : Bronchiectasis  Patient returns for a 1 month follow-up.  Patient says she has been having difficulty since November when she was diagnosed with pneumonia and was hospitalized.  Patient said that she was treated with antibiotics.  She was hospitalized for several days.  And then had to go to rehab for about a month.  Patient did require oxygen after her pneumonia diagnosis.  She says she is currently doing physical therapy at home.  She is feeling some better but continues to get very short of breath with minimum activities.  Wants to see if she can come off oxygen.  Patient does desat with ambulation but at rest O2 saturations maintained 90 to 92% on room air.  Patient denies any chest pain orthopnea PND or increased leg swelling.  Patient does have chronic hoarseness.  This seems to wax and wane.  Has previously been seen by ENT.     Allergies  Allergen Reactions   Nitrofurantoin Other (See Comments)    Severe headache HEADACHE   Levaquin [Levofloxacin In D5w] Other (See Comments)    Pain in tendons    Brimonidine Other (See Comments)    Made eyes and surrounding areas RED/ was an eye drop   Cephalexin Diarrhea and Other (See Comments)    Patient can't remember reaction (per  chart at [facility] states diarrhea) (tolerates Augmentin fine)   Erythromycin Ethylsuccinate Hives and Diarrhea   Oxycodone Itching    Immunization History  Administered Date(s) Administered   Fluad Quad(high Dose 65+) 02/22/2019, 02/20/2020, 02/18/2021   H1N1 06/05/2008   Influenza Split 02/07/2012   Influenza Whole 03/10/2000, 01/24/2008, 03/10/2009, 01/20/2010   Influenza, High Dose Seasonal PF 02/12/2013, 01/31/2017, 01/19/2018   Influenza,inj,Quad PF,6+ Mos 01/17/2014, 03/27/2015   Influenza,inj,quad, With Preservative 02/07/2017   Influenza-Unspecified 02/09/2016   PFIZER(Purple Top)SARS-COV-2 Vaccination 06/03/2019, 06/25/2019, 04/24/2020   Pneumococcal Conjugate-13 01/17/2014   Pneumococcal Polysaccharide-23 03/10/2000, 05/02/2007, 01/02/2012   Td 05/10/1992, 12/25/2007   Zoster Recombinat (Shingrix) 02/24/2018, 04/26/2018    Past Medical History:  Diagnosis Date   Allergy    Anemia    Anxiety    Arthritis    right knee;injections every 29month    Asthma    use daily   Breast cancer ([facility] 1994/1995   HX BREAST CANCER/ right brreast and left breast in 1995   Bronchiectasis ([place]    COMPRESSION FRACTURE, LUMBAR VERTEBRAE 08/21/2008   Qualifier: Diagnosis of  By: JArnoldo MoraleMD, JBalinda Quails   Depression    Diabetes mellitus    takes Amaryl and Januvia daily   Diverticulitis    Dyspnea    with activity   Early cataracts, bilateral    Eczema    Endometriosis    Fatty liver    Gastritis    Glaucoma    Hammer toe    History  of kidney stones    Hyperlipidemia associated with type 2 diabetes mellitus ([place]) 07/26/2007   Diet/exercise control.     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 8 g 2   atenolol (TENORMIN) 25 MG tablet Take 1 tablet by mouth once daily (Patient taking differently: Take 25 mg by mouth daily.) 90 tablet 3   Bacillus Coagulans-Inulin (ALIGN PREBIOTIC-PROBIOTIC PO) Take 1 capsule by mouth daily.      blood glucose meter kit and supplies KIT Dispense based on patient and insurance preference. Use up to four times daily as directed. Dx E11.9 1 each 0   budesonide-formoterol (SYMBICORT) 80-4.5 MCG/ACT inhaler Inhale 2 puffs into the lungs 2 (two) times daily. 1 each 3   cetirizine (ZYRTEC) 10 MG tablet Take 10 mg by mouth every morning.      Cholecalciferol (VITAMIN D3) 125 MCG (5000 UT) TABS Take 5,000 Units by mouth daily.     Coenzyme Q10 (COQ10) 200 MG CAPS Take 200 mg by mouth daily.     colesevelam (WELCHOL) 625 MG tablet Take 1 tablet (625 mg total) by mouth 3 (three) times daily. Need office visit. 90 tablet 0   desvenlafaxine (PRISTIQ) 50 MG 24 hr tablet Take 1 tablet (50 mg total) by mouth daily. 90 tablet 3   dicyclomine (BENTYL) 20 MG tablet Take 1 tablet (20 mg total) by mouth in the morning, at noon,  in the evening, and at bedtime. 120 tablet 5   dorzolamide-timolol (COSOPT) 22.3-6.8 MG/ML ophthalmic solution Place 1 drop into both eyes 2 (two) times daily.      ESTRING 2 MG vaginal ring INSERT 1 RING  INTO VAGINA EVERY 3 MONTHS 1 each 4   FIBER PO Take 2 tablets by mouth every evening.     fluticasone (FLONASE) 50 MCG/ACT nasal spray Place 1 spray into both nostrils daily. 18.2 mL 2   gabapentin (NEURONTIN) 100 MG capsule TAKE 1 CAPSULE BY MOUTH THREE TIMES DAILY AS NEEDED FOR  NEUROPATHIC  PAIN. (Patient taking differently: Take 200 mg by mouth 2 (two) times daily.)  270 capsule 3   glucose blood (ACCU-CHEK GUIDE) test strip USE  ONCE DAILY TO CHECK GLUCOSE 100 each 0   insulin degludec (TRESIBA) 200 UNIT/ML FlexTouch Pen Inject 60-80 Units into the skin in the morning. (Patient taking differently: Inject 80 Units into the skin in the morning.) 4 mL 11   Insulin Pen Needle (NOVOFINE PLUS PEN NEEDLE) 32G X 4 MM MISC 1 each by Does not apply route daily. 30 each 5   Lancets Misc. (ACCU-CHEK FASTCLIX LANCET) KIT Use to test blood sugars daily. Dx: E11.9 1 kit 5   latanoprost (XALATAN) 0.005 % ophthalmic solution Place 1 drop into the right eye at bedtime.      lisinopril (ZESTRIL) 10 MG tablet Take 1 tablet (10 mg total) by mouth daily. 90 tablet 3   lovastatin (MEVACOR) 10 MG tablet Take 1 tablet (10 mg total) by mouth daily. 90 tablet 3   montelukast (SINGULAIR) 10 MG tablet Take 1 tablet (10 mg total) by mouth at bedtime. 30 tablet 11   Multiple Vitamin (MULTIVITAMIN WITH MINERALS) TABS tablet Take 1 tablet by mouth daily. 3 Fruit Capsules 3 Vegetable Capsules - Balance of Nature 30 tablet 0   pramipexole (MIRAPEX) 0.5 MG tablet TAKE 1 TABLET BY MOUTH AT BEDTIME 90 tablet 0   Respiratory Therapy Supplies (FLUTTER) DEVI Use as directed 1 each 0   ROCKLATAN 0.02-0.005 % SOLN Place 1 drop into the left eye at bedtime.      Selenium 200 MCG CAPS Take 200 mcg by mouth at bedtime.       Spacer/Aero-Holding Chambers DEVI Use spacer with inhaler therapy 1 each 2   temazepam (RESTORIL) 15 MG capsule TAKE 1 CAPSULE BY MOUTH AT BEDTIME AS NEEDED FOR SLEEP (Patient taking differently: Take 15 mg by mouth at bedtime.) 30 capsule 5   UNABLE TO FIND Take 2 tablets by mouth at bedtime. Fiber therapy     vitamin B-12 (CYANOCOBALAMIN) 1000 MCG tablet Take 1,000 mcg by mouth daily.     albuterol (PROVENTIL) (2.5 MG/3ML) 0.083% nebulizer solution Take 3 mLs (2.5 mg total) by nebulization every 6 (six) hours as needed for wheezing or shortness of breath. (Patient not taking: Reported on 06/01/2021) 75 mL 12   Misc Natural Products (NEURIVA PO) Take 1 capsule by mouth daily. (Patient not taking: Reported on 06/01/2021)     glimepiride (AMARYL) 4 MG tablet TAKE 2 TABLETS BY MOUTH ONCE DAILY WITH BREAKFAST (Patient not taking: Reported on 06/01/2021) 180 tablet 0   HYDROcodone-acetaminophen (NORCO) 5-325 MG tablet Take 1-2 tablets by mouth every 6 (six) hours as needed for moderate pain or severe pain. (Patient not taking: Reported on 06/01/2021) 30 tablet 0   pantoprazole (PROTONIX) 40 MG tablet Take 1 tablet (40 mg total) by mouth daily. Take 1 hour around food. (Patient not taking: Reported on 06/01/2021) 30 tablet 3   No facility-administered medications prior to visit.     Review of Systems:   Constitutional:   No  weight loss, night sweats,  Fevers, chills,  +fatigue, or  lassitude.  HEENT:   No headaches,  Difficulty swallowing,  Tooth/dental problems, or  Sore throat,                No sneezing, itching, ear ache, nasal congestion, post nasal drip,   CV:  No chest pain,  Orthopnea, PND, swelling in lower extremities, anasarca, dizziness, palpitations, syncope.   GI  No heartburn, indigestion, abdominal pain, nausea, vomiting, diarrhea, change in bowel  habits, loss of appetite, bloody stools.   Resp:   No chest wall deformity  Skin: no rash or lesions.  GU: no dysuria, change in color  of urine, no urgency or frequency.  No flank pain, no hematuria   MS:  No joint pain or swelling.  No decreased range of motion.  No back pain.    Physical Exam  BP 130/60 (BP Location: Right Arm, Patient Position: Sitting, Cuff Size: Normal)    Pulse 98    Temp 98.6 F (37 C) (Oral)    Ht 6' 0.5" (1.842 m)    Wt 252 lb (114.3 kg)    LMP 05/10/1992 Comment: partial   SpO2 90%    BMI 33.71 kg/m   GEN: A/Ox3; pleasant , NAD, well nourished    HEENT:  [place]/AT, NOSE-clear, THROAT-clear, no lesions, no postnasal drip or exudate noted.   NECK:  Supple w/ fair ROM; no JVD; normal carotid impulses w/o bruits; no thyromegaly or nodules palpated; no lymphadenopathy.    RESP  Clear  P & A; w/o, wheezes/ rales/ or rhonchi. no accessory muscle use, no dullness to percussion  CARD:  RRR, no m/r/g, tr  peripheral edema, pulses intact, no cyanosis or clubbing.  GI:   Soft & nt; nml bowel sounds; no organomegaly or masses detected.   Musco: Warm bil, no deformities or joint swelling noted.   Neuro: alert, no focal deficits noted.    Skin: Warm, no lesions or rashes    Lab Results:      BNP No results found for: BNP  ProBNP   Imaging: No results found.    PFT Results Latest Ref Rng & Units 06/01/2021 12/02/2015  FVC-Pre L 1.70 2.52  FVC-Predicted Pre % 48 67  FVC-Post L 1.79 2.63  FVC-Predicted Post % 50 70  Pre FEV1/FVC % % 78 77  Post FEV1/FCV % % 77 77  FEV1-Pre L 1.32 1.94  FEV1-Predicted Pre % 49 68  FEV1-Post L 1.37 2.02  DLCO uncorrected ml/min/mmHg 18.21 25.72  DLCO UNC% % 76 74  DLCO corrected ml/min/mmHg 18.21 25.96  DLCO COR %Predicted % 76 75  DLVA Predicted % 125 94  TLC L 4.32 5.02  TLC % Predicted % 69 81  RV % Predicted % 86 91    No results found for: NITRICOXIDE      Assessment & Plan:   Chronic respiratory failure with hypoxia (HCC) Oxygen dependent respiratory failure.  Patient has been on oxygen since November when she was diagnosed with  pneumonia.  Does seem to have some decreased oxygen demands.  Now is able to be off of oxygen at rest.  Patient is continue on O2 2 L to maintain O2 saturations greater than 88 to 90%.  Plan  Patient Instructions  Continue on Symbicort 2 puffs Twice daily, rinse after use.  Discuss with PCP that lisinopril may be aggravating your cough and hoarseness.  Albuterol inhaler As needed   Remain on Oxygen 2lm .  Chest xray and labs today .  Follow up with Dr. Lamonte Sakai  in 6-8 weeks and As needed,.  Please contact office for sooner follow up if symptoms do not improve or worsen or seek emergency care       Community acquired pneumonia Clinically patient is improving.  We will check chest x-ray today for clearance.  Plan  Patient Instructions  Continue on Symbicort 2 puffs Twice daily, rinse after use.  Discuss with PCP that lisinopril may be aggravating your  cough and hoarseness.  Albuterol inhaler As needed   Remain on Oxygen 2lm .  Chest xray and labs today .  Follow up with Dr. Lamonte Sakai  in 6-8 weeks and As needed,.  Please contact office for sooner follow up if symptoms do not improve or worsen or seek emergency care       Physical deconditioning Continue with home physical therapy  SOB (shortness of breath) on exertion Patient has ongoing shortness of breath with activity with decreased activity tolerance.  Suspect is multifactorial and she is recovering from a severe pneumonia and hospitalization. Oxygen demands are decreasing.  We will check labs today with BNP and be met. With advance activity as tolerated.  Continue home PT.  Oxygen to maintain O2 saturations greater than 88 to 90%  Plan  Patient Instructions  Continue on Symbicort 2 puffs Twice daily, rinse after use.  Discuss with PCP that lisinopril may be aggravating your cough and hoarseness.  Albuterol inhaler As needed   Remain on Oxygen 2lm .  Chest xray and labs today .  Follow up with Dr. Lamonte Sakai  in 6-8 weeks and As  needed,.  Please contact office for sooner follow up if symptoms do not improve or worsen or seek emergency care       Bronchiectasis [facility]) Patient is continue on Symbicort twice daily.  Albuterol inhaler as needed. Chest x-ray today. Patient is prone to frequent flares.  Would consider changing lisinopril as may be aggravating her cough and ongoing hoarseness  Plan  Patient Instructions  Continue on Symbicort 2 puffs Twice daily, rinse after use.  Discuss with PCP that lisinopril may be aggravating your cough and hoarseness.  Albuterol inhaler As needed   Remain on Oxygen 2lm .  Chest xray and labs today .  Follow up with Dr. Lamonte Sakai  in 6-8 weeks and As needed,.  Please contact office for sooner follow up if symptoms do not improve or worsen or seek emergency care         Rexene Edison, NP 06/01/2021

## 2021-06-03 ENCOUNTER — Other Ambulatory Visit: Payer: Self-pay | Admitting: *Deleted

## 2021-06-03 DIAGNOSIS — J90 Pleural effusion, not elsewhere classified: Secondary | ICD-10-CM

## 2021-06-03 NOTE — Progress Notes (Signed)
ATC x1.  I was asked to call back in a little bit.  Will attempt later.

## 2021-06-03 NOTE — Progress Notes (Signed)
Called and spoke with patient, advised on results/recommendations per Rexene Edison NP.  She verbalized understanding.  She will come to the office tomorrow to have lab work drawn.  Advised on of our PCCs will call to schedule her CT.  Nothing further needed.

## 2021-06-04 ENCOUNTER — Other Ambulatory Visit (INDEPENDENT_AMBULATORY_CARE_PROVIDER_SITE_OTHER): Payer: Medicare PPO

## 2021-06-04 DIAGNOSIS — J189 Pneumonia, unspecified organism: Secondary | ICD-10-CM

## 2021-06-04 DIAGNOSIS — Q320 Congenital tracheomalacia: Secondary | ICD-10-CM | POA: Diagnosis not present

## 2021-06-04 DIAGNOSIS — J479 Bronchiectasis, uncomplicated: Secondary | ICD-10-CM

## 2021-06-04 DIAGNOSIS — J398 Other specified diseases of upper respiratory tract: Secondary | ICD-10-CM | POA: Diagnosis not present

## 2021-06-04 LAB — BASIC METABOLIC PANEL
BUN: 14 mg/dL (ref 6–23)
CO2: 34 mEq/L — ABNORMAL HIGH (ref 19–32)
Calcium: 9.7 mg/dL (ref 8.4–10.5)
Chloride: 100 mEq/L (ref 96–112)
Creatinine, Ser: 0.56 mg/dL (ref 0.40–1.20)
GFR: 85.61 mL/min (ref >60.00)
Glucose, Bld: 171 mg/dL — ABNORMAL HIGH (ref 70–99)
Potassium: 4.4 mEq/L (ref 3.5–5.1)
Sodium: 140 mEq/L (ref 135–145)

## 2021-06-04 LAB — BRAIN NATRIURETIC PEPTIDE: Pro B Natriuretic peptide (BNP): 13 pg/mL (ref 0.0–100.0)

## 2021-06-05 NOTE — Progress Notes (Signed)
Called and spoke with patient, advised of results/recommendations per Tammy Parrett NP.  She verbalized understanding.  Nothing further needed.

## 2021-06-09 ENCOUNTER — Other Ambulatory Visit: Payer: Self-pay | Admitting: Gastroenterology

## 2021-06-10 ENCOUNTER — Ambulatory Visit
Admission: RE | Admit: 2021-06-10 | Discharge: 2021-06-10 | Disposition: A | Discharge status: Home / Self Care | Payer: Medicare PPO | Source: Ambulatory Visit | Attending: Adult Health | Admitting: Adult Health

## 2021-06-10 DIAGNOSIS — J90 Pleural effusion, not elsewhere classified: Secondary | ICD-10-CM

## 2021-06-10 DIAGNOSIS — I700 Atherosclerosis of aorta: Secondary | ICD-10-CM | POA: Diagnosis not present

## 2021-06-12 ENCOUNTER — Telehealth: Payer: Self-pay | Admitting: Family Medicine

## 2021-06-12 NOTE — Telephone Encounter (Signed)
[name] from [facility] is needing verbal orders for recertification for PT. She is asking for 1x a week for 9 weeks. Please call [phone]

## 2021-06-15 ENCOUNTER — Telehealth: Payer: Self-pay | Admitting: Gastroenterology

## 2021-06-15 NOTE — Telephone Encounter (Signed)
Returned call to patient. She reports that she has been having (R) sided abdominal pain since Friday. She denies any changes in her diet. She states that the pain is in the middle of the (R) side of her abdomen and radiates to her back. She states that the pain got severe last night but she did not want to sit waiting in the ER. She states that the pain has eased up some but she she still needs to be seen. She is still taking her Dicyclomine 20 mg tablet QID. Pt has been scheduled to see Alonza Bogus, PA-C tomorrow at 2:30 pm. Pt knows to arrive about 10 minutes early for check-in. Pt verbalized understanding and had no concerns at the end of the call.   Message sent to Barbette Or, CMA to let her know pt was added a day prior.

## 2021-06-15 NOTE — Telephone Encounter (Signed)
Called and spoke with Gracee and VO provided.

## 2021-06-15 NOTE — Telephone Encounter (Signed)
Patient called this morning stating that ever since Friday night she has had sharp pains in her side radiating to her back and feels like it has something to do with her digestive system.  She does not want to go to the ER if she doesn't have to.  Please call patient and advise. Thank you.

## 2021-06-16 ENCOUNTER — Encounter: Payer: Self-pay | Admitting: Gastroenterology

## 2021-06-16 ENCOUNTER — Ambulatory Visit: Payer: Medicare PPO | Admitting: Gastroenterology

## 2021-06-16 VITALS — BP 130/80 | HR 72 | Ht 72.0 in | Wt 244.0 lb

## 2021-06-16 DIAGNOSIS — R1031 Right lower quadrant pain: Secondary | ICD-10-CM

## 2021-06-16 NOTE — Patient Instructions (Signed)
You have been scheduled for a CT scan of the abdomen and pelvis at [facility], [place], [place] [address].  You are scheduled on Thursday 06/18/21 at 3 pm. You should arrive 15 minutes prior to your appointment time for registration. Please follow the written instructions below on the day of your exam:  1) Do not eat  anything after 11 am (4 hours prior to your test) 2) You have been given 2 bottles of oral contrast to drink. The solution may taste better if refrigerated, but do NOT add ice or any other liquid to this solution. Shake well before drinking.    Drink 1 bottle of contrast @ 1 pm (2 hours prior to your exam)  Drink 1 bottle of contrast @ 2 pm (1 hour prior to your exam)  You may take any medications as prescribed with a small amount of water, if necessary. If you take any of the following medications: METFORMIN, GLUCOPHAGE, GLUCOVANCE, AVANDAMET, RIOMET, FORTAMET, [place] MET, JANUMET, GLUMETZA or METAGLIP, you MAY be asked to HOLD this medication 48 hours AFTER the exam.  The purpose of you drinking the oral contrast is to aid in the visualization of your intestinal tract. The contrast solution may cause some diarrhea. Depending on your individual set of symptoms, you may also receive an intravenous injection of x-ray contrast/dye.   This test typically takes 30-45 minutes to complete. ____________________________________________________________________  Stop Welchol for now until bowels start moving better.   If you are age 3 or older, your body mass index should be between 23-30. Your Body mass index is 33.09 kg/m. If this is out of the aforementioned range listed, please consider follow up with your Primary Care Provider.  If you are age 28 or younger, your body mass index should be between 19-25. Your Body mass index is 33.09 kg/m. If this is out of the aformentioned range listed, please consider follow up with your Primary Care Provider.    ________________________________________________________  The [facility] providers would like to encourage you to use [facility] to communicate with providers for non-urgent requests or questions.  Due to long hold times on the telephone, sending your provider a message by [facility] may be a faster and more efficient way to get a response.  Please allow 48 business hours for a response.  Please remember that this is for non-urgent requests.  _______________________________________________________

## 2021-06-16 NOTE — Progress Notes (Signed)
06/16/2021 ANALIZ TVEDT [id number] 11-17-1940   HISTORY OF PRESENT ILLNESS: This is an 81 year old female with multiple medical problems who follows with Dr. Silverio Decamp for her history of IBS-D/bile salt related diarrhea.  She typically uses WelChol and dicyclomine regularly for her symptoms.  She is here today with her husband for complaints of right-sided abdominal pain.  She says that this began suddenly on Saturday.  It was so severe yesterday morning she was went to the emergency department.  She says that initially she thought maybe it was gas and was taking some Gas-X and maybe that felt like it helped some.  Pain kind of radiates to her back.  She says that despite her history of diarrhea she actually seems like maybe she is constipated and has not had a BM in a few days.  She has continued to take her WelChol and her dicyclomine regularly.  No associated nausea, vomiting, fevers, chills.  Colonoscopy December 19, 2018: 4 sessile polyps [tubular adenomas] removed, 2 to 7 mm in size, sigmoid and descending colon diverticulosis and internal hemorrhoids.  No recall due to age.  Past Medical History:  Diagnosis Date   Allergy    Anemia    Anxiety    Arthritis    right knee;injections every 57months    Asthma    use daily   Breast cancer ([place]) 1994/1995   HX BREAST CANCER/ right brreast and left breast in 1995   Bronchiectasis ([place])    COMPRESSION FRACTURE, LUMBAR VERTEBRAE 08/21/2008   Qualifier: Diagnosis of  By: Arnoldo Morale MD, Balinda Quails    Depression    Diabetes mellitus    takes Amaryl and Januvia daily   Diverticulitis    Dyspnea    with activity   Early cataracts, bilateral    Eczema    Endometriosis    Fatty liver    Gastritis    Glaucoma    Hammer toe    History of kidney stones    Hyperlipidemia associated with type 2 diabetes mellitus ([place]) 07/26/2007   Diet/exercise control.     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   Thyroid disease    Vitamin D deficiency    takes Vit d daily   Weakness    uses a walker and wheelchair   Past Surgical History:  Procedure Laterality Date   ADENOIDECTOMY     at age 69   ANAL FISTULOTOMY N/A 03/07/2020   Procedure: ANAL FISTULOTOMY WITH MARSUPIALIZATION;  Surgeon: Michael Boston, MD;  [facility];  Service: General;  Laterality: N/A;   BIOPSY  12/19/2018   Procedure: BIOPSY;  Surgeon: Mauri Pole, MD;  Location: [facility];  Service: Endoscopy;;  CATARACT EXTRACTION     CHOLECYSTECTOMY  06/2008   COLONOSCOPY WITH PROPOFOL N/A 05/17/2017   Procedure: COLONOSCOPY WITH PROPOFOL;  Surgeon: Mauri Pole, MD;  Location: [facility];  Service: Endoscopy;  Laterality: N/A;  PT WILL BE ADMITTED THE DAY BEFORE FOR PREP PER ROBIN KB   COLONOSCOPY WITH PROPOFOL N/A 12/19/2018   Procedure: COLONOSCOPY WITH PROPOFOL;  Surgeon: Mauri Pole, MD;  Location: [facility];  Service: Endoscopy;  Laterality: N/A;   DILATION AND CURETTAGE OF UTERUS     excision on breast     internal infected suture from breast surgery   excision removed from neck  2008   infected lymph node   HEMORRHOID SURGERY N/A 11/22/2019   Procedure: HEMORRHOIDECTOMY LIGATION , PEXY;  Surgeon: Michael Boston, MD;  Location: [place];  Service: General;  Laterality: N/A;   HYPERBARIC OXYGEN  THERAPY     FEET INFECTION   HYSTEROSCOPY  06/22/2011   PMB submucosal myoma   JOINT REPLACEMENT  2014   left total shoulder   LAPAROSCOPIC OOPHORECTOMY Right 12/2004   absent LSO   LAPAROTOMY  1977   LIGATION OF INTERNAL FISTULA TRACT N/A 11/22/2019   Procedure: REPAIR OF PERIRECTAL FISTULA, ANORECTAL EXAMINATION UNDER ANESTHESIA;  Surgeon: Michael Boston, MD;  Location: [place];  Service: General;  Laterality: N/A;   MASTECTOMY Bilateral 1994 right, 1995 left   PLACEMENT OF SETON N/A 03/19/2021   Procedure: LIMBERG RHOMBOID ROTATIONAL FLAP CLOSURE;  Surgeon: Michael Boston, MD;  Location: [facility];  Service: General;  Laterality: N/A;   POLYPECTOMY  12/19/2018   Procedure: POLYPECTOMY;  Surgeon: Mauri Pole, MD;  Location: [facility];  Service: Endoscopy;;   RECTAL EXAM UNDER ANESTHESIA N/A 03/07/2020   Procedure: ANORECTAL EXAM UNDER ANESTHESIA;  Surgeon: Michael Boston, MD;  Location: [place];  Service: General;  Laterality: N/A;   RECTAL EXAM UNDER ANESTHESIA N/A 03/19/2021   Procedure: RECTAL EXAM UNDER ANESTHESIA;  Surgeon: Michael Boston, MD;  Location: [facility];  Service: General;  Laterality: N/A;   SHOULDER HEMI-ARTHROPLASTY  01/01/2012   Procedure: SHOULDER HEMI-ARTHROPLASTY;  Surgeon: Roseanne Kaufman, MD;  Location: [place];  Service: Orthopedics;  Laterality: Left;  Left Shoulder Hemi Arthroplasty with Repair and Reconstruction as Necessary    THYROIDECTOMY  1975   22yrs ago. follows endocrine   TONSILLECTOMY     as a child   TOTAL KNEE ARTHROPLASTY Right 12/30/2014   Procedure: RIGHT TOTAL KNEE ARTHROPLASTY;  Surgeon: Gaynelle Arabian, MD;  Location: [facility];  Service: Orthopedics;  Laterality: Right;   TOTAL KNEE ARTHROPLASTY Left 11/29/2016   Procedure: LEFT TOTAL KNEE ARTHROPLASTY;  Surgeon: Gaynelle Arabian, MD;  Location: [facility];  Service: Orthopedics;  Laterality: Left;    reports that she has never smoked. She has never used  smokeless tobacco. She reports that she does not drink alcohol and does not use drugs. family history includes Arthritis in her mother; COPD in her father; Heart disease in her father; Hyperlipidemia in her father; Hypertension in her father; Pancreatic cancer in her paternal grandfather. Allergies  Allergen Reactions   Nitrofurantoin Other (See Comments)    Severe headache HEADACHE   Levaquin [Levofloxacin In D5w] Other (See Comments)    Pain in tendons    Brimonidine Other (See Comments)    Made eyes and surrounding areas RED/ was an eye drop   Cephalexin Diarrhea and Other (See Comments)    Patient can't remember reaction (per chart at [facility]  states diarrhea) (tolerates Augmentin fine)   Erythromycin Ethylsuccinate Hives and Diarrhea   Oxycodone Itching      Outpatient Encounter Medications as of 06/16/2021  Medication Sig   ACCU-CHEK GUIDE test strip USE 1 TO TEST UP TO TWICE DAILY   albuterol (PROVENTIL) (2.5 MG/3ML) 0.083% nebulizer solution Take 3 mLs (2.5 mg total) by nebulization every 6 (six) hours as needed for wheezing or shortness of breath.   albuterol (VENTOLIN HFA) 108 (90 Base) MCG/ACT inhaler Inhale 2 puffs into the lungs every 6 (six) hours as needed for wheezing or shortness of breath.   atenolol (TENORMIN) 25 MG tablet Take 1 tablet by mouth once daily (Patient taking differently: Take 25 mg by mouth daily.)   Bacillus Coagulans-Inulin (ALIGN PREBIOTIC-PROBIOTIC PO) Take 1 capsule by mouth daily.    blood glucose meter kit and supplies KIT Dispense based on patient and insurance preference. Use up to four times daily as directed. Dx E11.9   budesonide-formoterol (SYMBICORT) 80-4.5 MCG/ACT inhaler Inhale 2 puffs into the lungs 2 (two) times daily.   cetirizine (ZYRTEC) 10 MG tablet Take 10 mg by mouth every morning.    Cholecalciferol (VITAMIN D3) 125 MCG (5000 UT) TABS Take 5,000 Units by mouth daily.   Coenzyme Q10 (COQ10) 200 MG CAPS Take 200 mg by mouth daily.    colesevelam (WELCHOL) 625 MG tablet Take 1 tablet (625 mg total) by mouth 3 (three) times daily. Need office visit.   desvenlafaxine (PRISTIQ) 50 MG 24 hr tablet Take 1 tablet (50 mg total) by mouth daily.   dicyclomine (BENTYL) 20 MG tablet TAKE 1 TABLET BY MOUTH IN THE MORNING ,  AT  NOON,  IN  THE  EVENING,  AND  AT  BEDTIME   dorzolamide-timolol (COSOPT) 22.3-6.8 MG/ML ophthalmic solution Place 1 drop into both eyes 2 (two) times daily.    ESTRING 2 MG vaginal ring INSERT 1 RING  INTO VAGINA EVERY 3 MONTHS   FIBER PO Take 2 tablets by mouth every evening.   fluticasone (FLONASE) 50 MCG/ACT nasal spray Place 1 spray into both nostrils daily.   gabapentin (NEURONTIN) 100 MG capsule TAKE 1 CAPSULE BY MOUTH THREE TIMES DAILY AS NEEDED FOR  NEUROPATHIC  PAIN. (Patient taking differently: Take 200 mg by mouth 2 (two) times daily.)   glucose blood (ACCU-CHEK GUIDE) test strip USE  ONCE DAILY TO CHECK GLUCOSE   insulin degludec (TRESIBA) 200 UNIT/ML FlexTouch Pen Inject 60-80 Units into the skin in the morning. (Patient taking differently: Inject 80 Units into the skin in the morning.)   Insulin Pen Needle (NOVOFINE PLUS PEN NEEDLE) 32G X 4 MM MISC 1 each by Does not apply route daily.   Lancets Misc. (ACCU-CHEK FASTCLIX LANCET) KIT Use to test blood sugars daily. Dx: E11.9   latanoprost (XALATAN) 0.005 % ophthalmic solution Place 1 drop into the right eye at bedtime.    lisinopril (ZESTRIL) 10 MG tablet Take 1 tablet (10 mg total) by mouth daily.   lovastatin (MEVACOR) 10 MG tablet Take 1 tablet (10 mg total) by mouth daily.   montelukast (SINGULAIR) 10 MG tablet Take 1 tablet (10 mg total) by mouth at bedtime.   Multiple Vitamin (MULTIVITAMIN WITH MINERALS) TABS tablet Take 1 tablet by mouth daily. 3 Fruit Capsules 3 Vegetable Capsules - Balance of Nature   pramipexole (MIRAPEX) 0.5 MG tablet TAKE 1 TABLET BY MOUTH AT BEDTIME   Respiratory Therapy Supplies (FLUTTER) DEVI Use as directed   ROCKLATAN  0.02-0.005 % SOLN  Place 1 drop into the left eye at bedtime.    Selenium 200 MCG CAPS Take 200 mcg by mouth at bedtime.    Spacer/Aero-Holding Chambers DEVI Use spacer with inhaler therapy   temazepam (RESTORIL) 15 MG capsule TAKE 1 CAPSULE BY MOUTH AT BEDTIME AS NEEDED FOR SLEEP (Patient taking differently: Take 15 mg by mouth at bedtime.)   UNABLE TO FIND Take 2 tablets by mouth at bedtime. Fiber therapy   vitamin B-12 (CYANOCOBALAMIN) 1000 MCG tablet Take 1,000 mcg by mouth daily.   [DISCONTINUED] Misc Natural Products (NEURIVA PO) Take 1 capsule by mouth daily. (Patient not taking: Reported on 06/01/2021)   No facility-administered encounter medications on file as of 06/16/2021.     REVIEW OF SYSTEMS  : All other systems reviewed and negative except where noted in the History of Present Illness.   PHYSICAL EXAM: BP 130/80    Pulse 72    Ht 6' (1.829 m)    Wt 244 lb (110.7 kg)    LMP 05/10/1992 Comment: partial   BMI 33.09 kg/m  General: Well developed white female in no acute distress; in wheelchair Head: Normocephalic and atraumatic Eyes:  Sclerae anicteric, conjunctiva pink. Ears: Normal auditory acuity Lungs: Clear throughout to auscultation; no W/R/R. Heart: Regular rate and rhythm; no M/R/G. Abdomen: Soft, non-distended.  BS present.  Right mid-abdominal TTP, but abdominal exam benign. Musculoskeletal: Symmetrical with no gross deformities  Skin: No lesions on visible extremities Extremities: No edema  Neurological: Alert oriented x 4, grossly non-focal Psychological:  Alert and cooperative. Normal mood and affect  ASSESSMENT AND PLAN: *Right-sided abdominal: Present since last Saturday, really no other associated symptoms except for some constipation.  Typically she struggles with diarrhea.  Has continued to take her WelChol, however.  Has been using her dicyclomine.  I have asked her to discontinue her WelChol and possibly begin taking some MiraLAX until her bowels seem to be  moving again.  Pain was very severe yesterday morning to the point that she almost went to the ER.  We will plan for CT scan of the abdomen and pelvis with contrast.  CC:  Marin Olp, MD

## 2021-06-18 ENCOUNTER — Ambulatory Visit
Admission: RE | Admit: 2021-06-18 | Discharge: 2021-06-18 | Disposition: A | Discharge status: Home / Self Care | Payer: Medicare PPO | Source: Ambulatory Visit | Attending: Gastroenterology | Admitting: Gastroenterology

## 2021-06-18 DIAGNOSIS — I700 Atherosclerosis of aorta: Secondary | ICD-10-CM | POA: Diagnosis not present

## 2021-06-18 DIAGNOSIS — J479 Bronchiectasis, uncomplicated: Secondary | ICD-10-CM | POA: Diagnosis not present

## 2021-06-18 DIAGNOSIS — R1031 Right lower quadrant pain: Secondary | ICD-10-CM

## 2021-06-18 DIAGNOSIS — J90 Pleural effusion, not elsewhere classified: Secondary | ICD-10-CM | POA: Diagnosis not present

## 2021-06-18 DIAGNOSIS — N200 Calculus of kidney: Secondary | ICD-10-CM | POA: Diagnosis not present

## 2021-06-18 MED ORDER — IOPAMIDOL (ISOVUE-300) INJECTION 61%
125.0000 mL | Freq: Once | INTRAVENOUS | Status: AC | PRN
  Administered 2021-06-18: 125 mL via INTRAVENOUS

## 2021-06-19 ENCOUNTER — Telehealth: Payer: Self-pay | Admitting: Gastroenterology

## 2021-06-19 NOTE — Telephone Encounter (Signed)
Patient called stating she received her results via [place].  She would like a call explaining what they meant and how she should proceed, as she is still having pain.  Please call and advise.  Thank you.

## 2021-06-22 ENCOUNTER — Telehealth: Payer: Self-pay | Admitting: Gastroenterology

## 2021-06-22 ENCOUNTER — Encounter (HOSPITAL_COMMUNITY): Payer: Self-pay

## 2021-06-22 ENCOUNTER — Other Ambulatory Visit: Payer: Self-pay

## 2021-06-22 ENCOUNTER — Ambulatory Visit (HOSPITAL_COMMUNITY)
Admission: EM | Admit: 2021-06-22 | Discharge: 2021-06-22 | Disposition: A | Discharge status: Home / Self Care | Payer: Medicare PPO | Attending: Emergency Medicine | Admitting: Emergency Medicine

## 2021-06-22 ENCOUNTER — Other Ambulatory Visit (INDEPENDENT_AMBULATORY_CARE_PROVIDER_SITE_OTHER): Payer: Medicare PPO

## 2021-06-22 ENCOUNTER — Other Ambulatory Visit: Payer: Medicare PPO

## 2021-06-22 DIAGNOSIS — R1031 Right lower quadrant pain: Secondary | ICD-10-CM

## 2021-06-22 DIAGNOSIS — R109 Unspecified abdominal pain: Secondary | ICD-10-CM

## 2021-06-22 LAB — URINALYSIS, ROUTINE W REFLEX MICROSCOPIC
Bilirubin Urine: NEGATIVE
Ketones, ur: NEGATIVE
Nitrite: POSITIVE — AB
Specific Gravity, Urine: >=1.03 — AB (ref 1.000–1.030)
Total Protein, Urine: 30 — AB
Urine Glucose: NEGATIVE
Urobilinogen, UA: 0.2 (ref 0.0–1.0)
pH: 5.5 (ref 5.0–8.0)

## 2021-06-22 MED ORDER — SULFAMETHOXAZOLE-TRIMETHOPRIM 800-160 MG PO TABS
1.0000 | ORAL_TABLET | Freq: Two times a day (BID) | ORAL | 0 refills | Status: DC
Start: 2021-06-22 — End: 2021-06-26

## 2021-06-22 MED ORDER — HYDROCODONE-ACETAMINOPHEN 5-325 MG PO TABS
2.0000 | ORAL_TABLET | ORAL | 0 refills | Status: DC | PRN
Start: 2021-06-22 — End: 2021-07-02

## 2021-06-22 MED ORDER — KETOROLAC TROMETHAMINE 60 MG/2ML IM SOLN
60.0000 mg | Freq: Once | INTRAMUSCULAR | Status: AC
  Administered 2021-06-22: 60 mg via INTRAMUSCULAR

## 2021-06-22 MED ORDER — KETOROLAC TROMETHAMINE 60 MG/2ML IM SOLN
INTRAMUSCULAR | Status: AC
  Filled 2021-06-22: qty 2

## 2021-06-22 NOTE — Telephone Encounter (Signed)
[name] have you reviewed the CT results.  The pt is calling to discuss

## 2021-06-22 NOTE — ED Provider Notes (Signed)
[place]    CSN: [id number] Arrival date & time: 06/22/21  1459    HISTORY   Chief Complaint  Patient presents with   Flank Pain   HPI [name] is a 81 y.o. female. Patient presents today with right-sided flank pain that radiates to her abdomen.  Patient states has been present for about 5 days.  Patient was seen by her regular provider on February night, 5 days ago, for the same issue.  Patient had a CT scan done of her abdomen which was concerning for mild right urothelial hyperenhancement at the renal pelvis, a nonobstructing renal stone in the right renal pelvis measuring 9 mm, no hydronephrosis or nephrolithiasis and unremarkable bladder.  The radiologist advised to correlate with urinalysis.  The history is provided by the patient.  Past Medical History:  Diagnosis Date   Allergy    Anemia    Anxiety    Arthritis    right knee;injections every 50month    Asthma    use daily   Breast cancer ([facility] 1994/1995   HX BREAST CANCER/ right brreast and left breast in 1995   Bronchiectasis ([facility]    COMPRESSION FRACTURE, LUMBAR VERTEBRAE 08/21/2008   Qualifier: Diagnosis of  By: JArnoldo MoraleMD, JBalinda Quails   Depression    Diabetes mellitus    takes Amaryl and Januvia daily   Diverticulitis    Dyspnea    with activity   Early cataracts, bilateral    Eczema    Endometriosis    Fatty liver    Gastritis    Glaucoma    Hammer toe    History of kidney stones    Hyperlipidemia associated with type 2 diabetes mellitus ([facility] 07/26/2007   Diet/exercise control.     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   Thyroid disease    Vitamin D deficiency    takes Vit d daily   Weakness    uses a walker and wheelchair   Patient Active Problem List   Diagnosis Date Noted   Right lower quadrant abdominal pain 06/16/2021   Chronic respiratory failure with hypoxia ([facility] 04/29/2021   Fatigue 04/29/2021   Hospital discharge follow-up 04/21/2021   Perineal sinus 03/25/2021   Effusion of right knee 03/22/2021   Acute respiratory failure with hypoxia ([facility] 03/22/2021   Community acquired pneumonia 03/21/2021   Chronic UTI 02/23/2021   Heart palpitations 02/11/2021   Ankle edema, bilateral 02/11/2021   Hoarseness 10/17/2020   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 11/22/2019   Healthcare maintenance 02/22/2019   Physical deconditioning 02/22/2019   SOB (shortness of breath) on exertion 02/22/2019   Adenomatous polyp 12/20/2018   Pain  due to onychomycosis of toenails of both feet 11/08/2018   Diabetic neuropathy ([place]) 11/08/2018   History of total knee replacement, bilateral 11/14/2017   Personal history of colonic polyps 05/18/2017   Arthritis of midfoot 03/03/2016   Hammertoes of both feet 03/03/2016   Aortic atherosclerosis ([place]) 02/16/2016   Solitary pulmonary nodule 01/08/2016   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   Bronchiectasis ([place]) 10/18/2015   IBS (irritable bowel syndrome) 07/17/2014   Recurrent major depression in full remission ([place]) 01/03/2014   Posterior tibial tendon dysfunction 01/03/2014   Osteoarthritis, knee 01/03/2014   Glaucoma 01/03/2014   Hypertension associated with diabetes ([place]) 01/03/2014   Obesity (BMI 30-39.9) 06/15/2013   Primary open-angle glaucoma 08/02/2012   Foot tendinitis 07/20/2010   INSOMNIA, CHRONIC  07/25/2009   Fatty liver 03/18/2009   GERD 12/25/2007   Hyperlipidemia associated with type 2 diabetes mellitus ([place]) 07/26/2007   ANEMIA, B12 DEFICIENCY 12/29/2006   Diabetes mellitus type II, controlled ([place]) 12/28/2006   RESTLESS LEG SYNDROME, MILD 12/28/2006   NEUROPATHY, IDIOPATHIC PERIPHERAL NEC 12/28/2006   Osteoporosis 12/12/2006   BREAST CANCER, HX OF 12/12/2006   Past Surgical History:  Procedure Laterality Date   ADENOIDECTOMY     at age 80   ANAL FISTULOTOMY N/A 03/07/2020   Procedure: ANAL FISTULOTOMY WITH MARSUPIALIZATION;  Surgeon: Michael Boston, MD;  [facility];  Service: General;  Laterality: N/A;   BIOPSY  12/19/2018   Procedure: BIOPSY;  Surgeon: Mauri Pole, MD;  [facility];  Service: Endoscopy;;   CATARACT EXTRACTION     CHOLECYSTECTOMY  06/2008   COLONOSCOPY WITH PROPOFOL N/A 05/17/2017   Procedure: COLONOSCOPY WITH PROPOFOL;  Surgeon: Mauri Pole, MD;  [facility];  Service: Endoscopy;  Laterality: N/A;  PT WILL BE ADMITTED THE DAY BEFORE FOR PREP PER ROBIN KB   COLONOSCOPY WITH PROPOFOL N/A 12/19/2018   Procedure: COLONOSCOPY WITH PROPOFOL;  Surgeon: Mauri Pole, MD;  [facility];  Service: Endoscopy;  Laterality: N/A;   DILATION AND CURETTAGE OF UTERUS     excision on breast     internal infected suture from breast surgery   excision removed from neck  2008   infected lymph node   HEMORRHOID SURGERY N/A 11/22/2019   Procedure: HEMORRHOIDECTOMY LIGATION , PEXY;  Surgeon: Michael Boston, MD;  Location: [place];  Service: General;  Laterality: N/A;   HYPERBARIC OXYGEN THERAPY     FEET INFECTION   HYSTEROSCOPY  06/22/2011   PMB submucosal myoma   JOINT REPLACEMENT  2014   left total shoulder   LAPAROSCOPIC OOPHORECTOMY Right 12/2004   absent LSO   LAPAROTOMY  1977   LIGATION OF INTERNAL FISTULA TRACT N/A 11/22/2019   Procedure: REPAIR OF PERIRECTAL  FISTULA, ANORECTAL EXAMINATION UNDER ANESTHESIA;  Surgeon: Michael Boston, MD;  Location: [place];  Service: General;  Laterality: N/A;   MASTECTOMY Bilateral 1994 right, 1995 left   PLACEMENT OF SETON N/A 03/19/2021   Procedure: LIMBERG RHOMBOID ROTATIONAL FLAP CLOSURE;  Surgeon: Michael Boston, MD;  [facility];  Service: General;  Laterality: N/A;   POLYPECTOMY  12/19/2018   Procedure: POLYPECTOMY;  Surgeon: Mauri Pole, MD;  [facility];  Service: Endoscopy;;   RECTAL EXAM UNDER ANESTHESIA N/A 03/07/2020   Procedure: ANORECTAL EXAM UNDER ANESTHESIA;  Surgeon: Michael Boston, MD;  Location: [facility];  Service: General;  Laterality: N/A;   RECTAL EXAM UNDER ANESTHESIA N/A 03/19/2021   Procedure: RECTAL EXAM  UNDER ANESTHESIA;  Surgeon: Michael Boston, MD;  [facility];  Service: General;  Laterality: N/A;   SHOULDER HEMI-ARTHROPLASTY  01/01/2012   Procedure: SHOULDER HEMI-ARTHROPLASTY;  Surgeon: Roseanne Kaufman, MD;  Location: [place];  Service: Orthopedics;  Laterality: Left;  Left Shoulder Hemi Arthroplasty with Repair and Reconstruction as Necessary    THYROIDECTOMY  1975   69yr ago. follows endocrine   TONSILLECTOMY     as a child   TOTAL KNEE ARTHROPLASTY Right 12/30/2014   Procedure: RIGHT TOTAL KNEE ARTHROPLASTY;  Surgeon: FGaynelle Arabian MD;  [facility];  Service: Orthopedics;  Laterality: Right;   TOTAL KNEE ARTHROPLASTY Left 11/29/2016   Procedure: LEFT TOTAL KNEE ARTHROPLASTY;  Surgeon: AGaynelle Arabian MD;  [facility];  Service: Orthopedics;  Laterality: Left;   OB History     Gravida  0   Para      Term      Preterm      AB      Living  0      SAB      IAB      Ectopic      Multiple      Live Births           Obstetric Comments  1 adopted        Home Medications    Prior to Admission medications   Medication Sig Start Date End Date Taking? Authorizing Provider  ACCU-CHEK  GUIDE test strip USE 1 TO TEST UP TO TWICE DAILY 05/14/20   HMarin Olp MD  albuterol (PROVENTIL) (2.5 MG/3ML) 0.083% nebulizer solution Take 3 mLs (2.5 mg total) by nebulization every 6 (six) hours as needed for wheezing or shortness of breath. 04/21/21   Cobb, KKarie Schwalbe NP  albuterol (VENTOLIN HFA) 108 (90 Base) MCG/ACT inhaler Inhale 2 puffs into the lungs every 6 (six) hours as needed for wheezing or shortness of breath. 04/21/21   Cobb, KKarie Schwalbe NP  atenolol (TENORMIN) 25 MG tablet Take 1 tablet by mouth once daily Patient taking differently: Take 25 mg by mouth daily. 11/28/20   HMarin Olp MD  Bacillus Coagulans-Inulin (ALIGN PREBIOTIC-PROBIOTIC PO) Take 1 capsule by mouth daily.     [provider]  blood glucose meter kit and supplies KIT Dispense based on patient and insurance preference. Use up to four times daily as directed. Dx E11.9 08/21/19   HMarin Olp MD  budesonide-formoterol ([facility] 80-4.5 MCG/ACT inhaler Inhale 2 puffs into the lungs 2 (two) times daily. 02/11/21   HJeanie Sewer NP  cetirizine (ZYRTEC) 10 MG tablet Take 10 mg by mouth every morning.     [provider]  Cholecalciferol (VITAMIN D3) 125 MCG (5000 UT) TABS Take 5,000 Units by mouth daily.    [provider]  Coenzyme Q10 (COQ10) 200 MG CAPS Take 200 mg by mouth daily.    [provider]  colesevelam (WELCHOL) 625 MG tablet Take 1 tablet (625 mg total) by mouth 3 (three) times daily. Need office visit. 05/05/21   NMauri Pole MD  desvenlafaxine (PRISTIQ) 50 MG 24 hr tablet Take 1 tablet (50 mg total) by mouth daily. 03/04/21   HMarin Olp MD  dicyclomine (BENTYL) 20 MG tablet TAKE 1 TABLET BY MOUTH IN THE MORNING ,  AT  NOON,  IN  THE  EVENING,  AND  AT  BEDTIME 06/09/21   Nandigam, KVenia Minks MD  dorzolamide-timolol (COSOPT) 22.3-6.8 MG/ML ophthalmic solution Place 1 drop into  both eyes 2 (two) times daily.  02/08/13   [provider]  ESTRING 2 MG vaginal ring INSERT 1 RING  INTO VAGINA EVERY 3 MONTHS 07/29/20   Megan Salon, MD  FIBER PO Take 2 tablets by mouth every evening.    [provider]  fluticasone (FLONASE) 50 MCG/ACT nasal spray Place 1 spray into both nostrils daily. 04/21/21   Cobb, Karie Schwalbe, NP  gabapentin (NEURONTIN) 100 MG capsule TAKE 1 CAPSULE BY MOUTH THREE TIMES DAILY AS NEEDED FOR  NEUROPATHIC  PAIN. Patient taking differently: Take 200 mg by mouth 2 (two) times daily. 12/04/20   Marin Olp, MD  glucose blood (ACCU-CHEK GUIDE) test strip USE  ONCE DAILY TO CHECK GLUCOSE 05/20/21   Marin Olp, MD  insulin degludec (TRESIBA) 200 UNIT/ML FlexTouch Pen Inject 60-80 Units into the skin in the morning. Patient taking differently: Inject 80 Units into the skin in the morning. 01/22/21   Marin Olp, MD  Insulin Pen Needle (NOVOFINE PLUS PEN NEEDLE) 32G X 4 MM MISC 1 each by Does not apply route daily. 08/11/20   Marin Olp, MD  Lancets Misc. (ACCU-CHEK FASTCLIX LANCET) KIT Use to test blood sugars daily. Dx: E11.9 08/23/19   Marin Olp, MD  latanoprost (XALATAN) 0.005 % ophthalmic solution Place 1 drop into the right eye at bedtime.  05/20/18   [provider]  lisinopril (ZESTRIL) 10 MG tablet Take 1 tablet (10 mg total) by mouth daily. 10/29/20   Marin Olp, MD  lovastatin (MEVACOR) 10 MG tablet Take 1 tablet (10 mg total) by mouth daily. 08/11/20   Marin Olp, MD  montelukast (SINGULAIR) 10 MG tablet Take 1 tablet (10 mg total) by mouth at bedtime. 04/21/21   Cobb, Karie Schwalbe, NP  Multiple Vitamin (MULTIVITAMIN WITH MINERALS) TABS tablet Take 1 tablet by mouth daily. 3 Fruit Capsules 3 Vegetable Capsules - Balance of Nature 03/26/21   Little Ishikawa, MD  pramipexole (MIRAPEX) 0.5 MG tablet TAKE 1 TABLET BY MOUTH AT BEDTIME 04/27/21   Marin Olp, MD  Respiratory Therapy Supplies (FLUTTER) DEVI Use as directed 01/08/16    Juanito Doom, MD  ROCKLATAN 0.02-0.005 % SOLN Place 1 drop into the left eye at bedtime.  01/02/20   [provider]  Selenium 200 MCG CAPS Take 200 mcg by mouth at bedtime.     [provider]  Spacer/Aero-Holding Chambers DEVI Use spacer with inhaler therapy 04/29/21   Cobb, Karie Schwalbe, NP  temazepam (RESTORIL) 15 MG capsule TAKE 1 CAPSULE BY MOUTH AT BEDTIME AS NEEDED FOR SLEEP Patient taking differently: Take 15 mg by mouth at bedtime. 01/29/21   Marin Olp, MD  UNABLE TO FIND Take 2 tablets by mouth at bedtime. Fiber therapy    [provider]  vitamin B-12 (CYANOCOBALAMIN) 1000 MCG tablet Take 1,000 mcg by mouth daily.    [provider]   Family History Family History  Problem Relation Age of Onset   Arthritis Mother    COPD Father    Heart disease Father        MI 58 - states he died of lockjaw   Hypertension Father    Hyperlipidemia Father    Pancreatic cancer Paternal Grandfather    Colon cancer Neg Hx    Esophageal cancer Neg Hx    Rectal cancer Neg Hx    Stomach cancer Neg Hx    Social History Social History  Tobacco Use   Smoking status: Never   Smokeless tobacco: Never  Vaping Use   Vaping Use: Never used  Substance Use Topics   Alcohol use: No   Drug use: No   Allergies   Nitrofurantoin, Levaquin [levofloxacin in d5w], Brimonidine, Cephalexin, Erythromycin ethylsuccinate, and Oxycodone  Review of Systems Review of Systems Pertinent findings noted in history of present illness.   Physical Exam Triage Vital Signs ED Triage Vitals  Enc Vitals Group     BP 03/06/21 0827 (!) 147/82     Pulse Rate 03/06/21 0827 72     Resp 03/06/21 0827 18     Temp 03/06/21 0827 98.3 F (36.8 C)     Temp Source 03/06/21 0827 Oral     SpO2 03/06/21 0827 98 %     Weight --      Height --      Head Circumference --      Peak Flow --      Pain Score 03/06/21 0826 5     Pain Loc --      Pain Edu? --      Excl. in [place]? --    No data found.  Updated Vital Signs BP (!) 152/78 (BP Location: Left Arm)    Pulse (!) 111    Temp 99 F (37.2 C) (Oral)    Resp 20    LMP 05/10/1992 Comment: partial   SpO2 92%   Physical Exam  Visual Acuity Right Eye Distance:   Left Eye Distance:   Bilateral Distance:    Right Eye Near:   Left Eye Near:    Bilateral Near:     UC Couse / Diagnostics / Procedures:    EKG  Radiology No results found.  Procedures Procedures (including critical care time)  UC Diagnoses / Final Clinical Impressions(s)   I have reviewed the triage vital signs and the nursing notes.  Pertinent labs & imaging results that were available during my care of the patient were reviewed by me and considered in my medical decision making (see chart for details).    Final diagnoses:  Right flank pain     Urine dip performed by her GI provider today was positive for hematuria to a large degree, nitrites, protein, pyuria, and cloudy appearance.  Urine culture was also ordered and results are pending at this time.     I have advised patient to begin Bactrim 1 tablet twice daily for the next 5 days while she awaits the results of the urine culture.   Patient was advised to take all doses exactly as prescribed.  Patient also advised of risks of worsening infection with incomplete antibiotic therapy.   Patient advised that she should be contacted with results of the urine culture they are complete by her GI provider and that treatment will be adjusted as indicated, based on results.  Patient also advised that the CT imaging performed on February 9 demonstrated a small kidney stone that is not obstructing at this time.  Patient states she is in a significant amount of pain.  Patient states she is been taking old narcotic pain medication for pain relief at this time which is the only way she has been able to tolerate the pain.  Patient is requesting a refill.  Patient provided with an injection of ketorolac  and a prescription for 10 hydrocodone tablets.   Return precautions advised.  ED Prescriptions     Medication Sig Dispense Auth. Provider   sulfamethoxazole-trimethoprim (BACTRIM  DS) 800-160 MG tablet Take 1 tablet by mouth 2 (two) times daily for 5 days. 10 tablet Lynden Oxford Scales, PA-C   HYDROcodone-acetaminophen (NORCO/VICODIN) 5-325 MG tablet Take 2 tablets by mouth every 4 (four) hours as needed. 10 tablet Lynden Oxford Scales, PA-C      I have reviewed the PDMP during this encounter.  Pending results:  Labs Reviewed - No data to display   Medications Ordered in UC: Medications  ketorolac (TORADOL) injection 60 mg (has no administration in time range)    Disposition Upon Discharge:  Condition: stable for discharge home  Patient presented with concern for an acute illness with associated systemic symptoms and significant discomfort requiring urgent management. In my opinion, this is a condition that a prudent lay person (someone who possesses an average knowledge of health and medicine) may potentially expect to result in complications if not addressed urgently such as respiratory distress, impairment of bodily function or dysfunction of bodily organs.   As such, the patient has been evaluated and assessed, work-up was performed and treatment was provided in alignment with urgent care protocols and evidence based medicine.  Patient/parent/caregiver has been advised that the patient may require follow up for further testing and/or treatment if the symptoms continue in spite of treatment, as clinically indicated and appropriate.  Routine symptom specific, illness specific and/or disease specific instructions were discussed with the patient and/or caregiver at length.  Prevention strategies for avoiding STD exposure were also discussed.  The patient will follow up with their current PCP if and as advised. If the patient does not currently have a PCP we will assist them in  obtaining one.   The patient may need specialty follow up if the symptoms continue, in spite of conservative treatment and management, for further workup, evaluation, consultation and treatment as clinically indicated and appropriate.  Patient/parent/caregiver verbalized understanding and agreement of plan as discussed.  All questions were addressed during visit.  Please see discharge instructions below for further details of plan.  Discharge Instructions:   Discharge Instructions      The CT scan of your right lower quadrant revealed a 9 mm stone in your kidney that is not blocking anything at this time.  There is also some concern for soft tissue infection which is why your gastroenterologist asked you to come in to have your urine checked.  Urinalysis performed at your gastroenterologist office was positive for acute urinary tract infection, they have also ordered a urine culture which will provide you with a more detailed diagnosis which will include the particular bacterial organism causing the infection.  I recommend that you begin taking Bactrim today for treatment of an acute urinary tract infection.  Bactrim is a broad-spectrum antibiotic that covers many of the bacterial organisms that routinely cause urinary tract infections.  In the unlikely event that the urine culture reveals an organism that is not covered by Bactrim, I am sure that your provider will provide you with a more specific antibiotic.  I have provided you with a prescription for 10 hydrocodone tablets for your comfort.  You also received an injection of ketorolac today which should significantly reduce your pain in your right flank for the next 6 to 8 hours.  Please consider beginning a hydrocodone before your pain returns at full force, it is always easier to treat a little bit of pain than it is to treat a lot of pain.  Thank you for visiting urgent care today.  I appreciate the opportunity  to participate in your  care.    This office note has been dictated using Museum/gallery curator.  Unfortunately, and despite my best efforts, this method of dictation can sometimes lead to occasional typographical or grammatical errors.  I apologize in advance if this occurs.      Lynden Oxford Scales, PA-C 06/22/21 1643

## 2021-06-22 NOTE — Telephone Encounter (Signed)
Patient last seen By Alonza Bogus, PA. Koren Shiver, RN has spoken with the patient. Labs were done. Closing this encounter.

## 2021-06-22 NOTE — ED Triage Notes (Signed)
Pt presents with right side flank pain that radiates around to abdomen X 5 days.

## 2021-06-22 NOTE — Telephone Encounter (Signed)
Patient called and wanted to follow-up and speak with Janett Billow about recent CT and the issues she is continuing to have.  Please call patient and advise.  Thank you.

## 2021-06-22 NOTE — Discharge Instructions (Addendum)
The CT scan of your right lower quadrant revealed a 9 mm stone in your kidney that is not blocking anything at this time.  There is also some concern for soft tissue infection which is why your gastroenterologist asked you to come in to have your urine checked.  Urinalysis performed at your gastroenterologist office was positive for acute urinary tract infection, they have also ordered a urine culture which will provide you with a more detailed diagnosis which will include the particular bacterial organism causing the infection.  I recommend that you begin taking Bactrim today for treatment of an acute urinary tract infection.  Bactrim is a broad-spectrum antibiotic that covers many of the bacterial organisms that routinely cause urinary tract infections.  In the unlikely event that the urine culture reveals an organism that is not covered by Bactrim, I am sure that your provider will provide you with a more specific antibiotic.  I have provided you with a prescription for 10 hydrocodone tablets for your comfort.  You also received an injection of ketorolac today which should significantly reduce your pain in your right flank for the next 6 to 8 hours.  Please consider beginning a hydrocodone before your pain returns at full force, it is always easier to treat a little bit of pain than it is to treat a lot of pain.  Thank you for visiting urgent care today.  I appreciate the opportunity to participate in your care.

## 2021-06-23 ENCOUNTER — Telehealth: Payer: Self-pay | Admitting: Family Medicine

## 2021-06-23 NOTE — Telephone Encounter (Signed)
Returned pt call and Pt states her lung doctor said Lisinopril is not good for her coughing, she said this is twice she has been told this about the Lisinopril. So she would like to know what to do about this and would like response via mychart.

## 2021-06-23 NOTE — Progress Notes (Signed)
Called and spoke with patient, provided results/recommendations per Rexene Edison NP.  She verbalized understanding.  I let her know that I would get the information to her pcp for follow up.  Nothing further needed.

## 2021-06-23 NOTE — Telephone Encounter (Signed)
You may stop her lisinopril 10 mg and start valsartan 80 mg daily #90 with 3 refills-similar medication with lower risk for cough-can take some time for adverse effects of lisinopril to resolve in regards to cough

## 2021-06-23 NOTE — Telephone Encounter (Signed)
Patient called to sch an appt with Dr Yong Channel- 07/09/21 at 4pm - Patient needs to discuss BP medication with Dr Yong Channel or MA.- Did not mention Medication or dosage.

## 2021-06-24 ENCOUNTER — Other Ambulatory Visit: Payer: Self-pay

## 2021-06-24 ENCOUNTER — Telehealth: Payer: Self-pay

## 2021-06-24 ENCOUNTER — Ambulatory Visit (INDEPENDENT_AMBULATORY_CARE_PROVIDER_SITE_OTHER): Payer: Medicare PPO

## 2021-06-24 DIAGNOSIS — R300 Dysuria: Secondary | ICD-10-CM | POA: Diagnosis not present

## 2021-06-24 DIAGNOSIS — J45909 Unspecified asthma, uncomplicated: Secondary | ICD-10-CM | POA: Diagnosis not present

## 2021-06-24 DIAGNOSIS — N390 Urinary tract infection, site not specified: Secondary | ICD-10-CM | POA: Diagnosis not present

## 2021-06-24 DIAGNOSIS — J4520 Mild intermittent asthma, uncomplicated: Secondary | ICD-10-CM | POA: Diagnosis not present

## 2021-06-24 DIAGNOSIS — M179 Osteoarthritis of knee, unspecified: Secondary | ICD-10-CM | POA: Diagnosis not present

## 2021-06-24 DIAGNOSIS — J9601 Acute respiratory failure with hypoxia: Secondary | ICD-10-CM | POA: Diagnosis not present

## 2021-06-24 DIAGNOSIS — S42309A Unspecified fracture of shaft of humerus, unspecified arm, initial encounter for closed fracture: Secondary | ICD-10-CM | POA: Diagnosis not present

## 2021-06-24 DIAGNOSIS — J479 Bronchiectasis, uncomplicated: Secondary | ICD-10-CM | POA: Diagnosis not present

## 2021-06-24 LAB — URINE CULTURE
MICRO NUMBER:: 13000545
SPECIMEN QUALITY:: ADEQUATE

## 2021-06-24 MED ORDER — CEFTRIAXONE SODIUM 1 G IJ SOLR
1.0000 g | Freq: Once | INTRAMUSCULAR | Status: AC
  Administered 2021-06-24: 1 g via INTRAMUSCULAR

## 2021-06-24 MED ORDER — VALSARTAN 80 MG PO TABS: 80.0000 mg | ORAL_TABLET | Freq: Every day | ORAL | 3 refills | Status: DC

## 2021-06-24 NOTE — Telephone Encounter (Signed)
Valsartan sent to pharmacy, called pt but was unable to reach. Will try again later.

## 2021-06-24 NOTE — Telephone Encounter (Signed)
Noted  

## 2021-06-24 NOTE — Telephone Encounter (Signed)
Pt came in for injection, made pt aware of the below while in office.

## 2021-06-24 NOTE — Telephone Encounter (Signed)
Home Health Certification or Plan of Care Tracking   [facility] Agency: [place] Number:  [id number]  Where has form been placed:  [employer]  Faxed to:   [phone]

## 2021-06-25 ENCOUNTER — Other Ambulatory Visit: Payer: Medicare PPO

## 2021-06-26 ENCOUNTER — Telehealth (HOSPITAL_COMMUNITY): Payer: Self-pay | Admitting: *Deleted

## 2021-06-26 MED ORDER — AMOXICILLIN-POT CLAVULANATE 875-125 MG PO TABS: 1.0000 | ORAL_TABLET | Freq: Two times a day (BID) | ORAL | 0 refills | Status: AC

## 2021-06-26 NOTE — Telephone Encounter (Signed)
Returned pt phone call; pt inquiring about urine cx results and states the Bactrim is not working. Notified pt that Dr Ansel Bong office put in a note to discontinue the Bactrim and to start Augmentin -- note states Rx was sent per Dr. Ansel Bong office. Pt verbalized understanding and will f/u with Dr. Ansel Bong office.

## 2021-06-27 ENCOUNTER — Telehealth: Payer: Self-pay | Admitting: Gastroenterology

## 2021-06-27 NOTE — Telephone Encounter (Signed)
Patient called this evening after hours. Experiencing 10/10 right sided abdominal pain. States she has been evaluated for this recently with a CT scan, found to have UTI, completing antibiotics. Got constipation and took some Mag citrate which relieved her constipation but now in excruciating pain worse than before. She asked me to call her in pain medication. I told her I don't feel comfortable doing that over the phone without her being evaluated, I'm not sure if she is developing worsening UTI / pyelonephritis is possible. She needs to seek care in the ED, urgent care, or call her PCP who has been managing her UTI more recently. Did not see any problems in her bowel on recent CT Scan to cause this pain. She can try some NSAIDs OTC in the interim to see if that will help. She asked that I pass a message to Dr. Silverio Decamp / Janett Billow and is hoping to be seen next week if her symptoms persist. If related to UTI she should see PCP but defer to Dr. Margreta Journey can you please check on this patient Monday AM to see how she is doing? Thanks

## 2021-06-29 ENCOUNTER — Telehealth: Payer: Self-pay

## 2021-06-29 NOTE — Telephone Encounter (Signed)
Pt scheduled for 02/23.

## 2021-06-29 NOTE — Telephone Encounter (Signed)
I spoke with the [name] and [name] tells me that her pain has eased up since last night. [name] has an appt with urologist next week as well as appt with Janett Billow.  [name] will keep those appts as scheduled.  I did give her the recommendations from Dr Zelphia Cairo in regards to the ED or urgent care if the pain returns.  [name] is not happy with that plan and will try to wait it out until her appts next week.  I did try to impress the importance of ED eval if the pain returns and it is severe.  The [name] has been advised of the information and verbalized understanding.

## 2021-06-29 NOTE — Telephone Encounter (Signed)
Pt called in stating she is having a lot of pain from her UTI. Pt would like to be worked in so that she can have new Rx for pain med to hopefully find some relief. She has an appointment with you on 03/02 but states she can't wait that long. I did offer an appointment with an available provider for today but she states she will wait for your return due to only wanting to see you. When can we work her in this week?

## 2021-06-29 NOTE — Telephone Encounter (Signed)
Patient calling to follow up with someone regarding message below.

## 2021-07-01 NOTE — Progress Notes (Signed)
Reviewed and agree with documentation and assessment and plan. K. Veena Tichina Koebel , MD   

## 2021-07-02 ENCOUNTER — Ambulatory Visit: Payer: Medicare PPO | Admitting: Family Medicine

## 2021-07-02 ENCOUNTER — Encounter: Payer: Self-pay | Admitting: Family Medicine

## 2021-07-02 ENCOUNTER — Other Ambulatory Visit: Payer: Self-pay

## 2021-07-02 VITALS — BP 122/64 | HR 77 | Temp 98.0°F | Ht 72.0 in | Wt 245.0 lb

## 2021-07-02 DIAGNOSIS — F3342 Major depressive disorder, recurrent, in full remission: Secondary | ICD-10-CM

## 2021-07-02 DIAGNOSIS — G609 Hereditary and idiopathic neuropathy, unspecified: Secondary | ICD-10-CM | POA: Diagnosis not present

## 2021-07-02 DIAGNOSIS — I700 Atherosclerosis of aorta: Secondary | ICD-10-CM

## 2021-07-02 DIAGNOSIS — E1142 Type 2 diabetes mellitus with diabetic polyneuropathy: Secondary | ICD-10-CM | POA: Diagnosis not present

## 2021-07-02 DIAGNOSIS — E1165 Type 2 diabetes mellitus with hyperglycemia: Secondary | ICD-10-CM

## 2021-07-02 LAB — COMPREHENSIVE METABOLIC PANEL
ALT: 11 U/L (ref 0–35)
AST: 14 U/L (ref 0–37)
Albumin: 3.5 g/dL (ref 3.5–5.2)
Alkaline Phosphatase: 83 U/L (ref 39–117)
BUN: 18 mg/dL (ref 6–23)
CO2: 32 mEq/L (ref 19–32)
Calcium: 9.1 mg/dL (ref 8.4–10.5)
Chloride: 101 mEq/L (ref 96–112)
Creatinine, Ser: 0.61 mg/dL (ref 0.40–1.20)
GFR: 83.82 mL/min (ref >60.00)
Glucose, Bld: 236 mg/dL — ABNORMAL HIGH (ref 70–99)
Potassium: 4.1 mEq/L (ref 3.5–5.1)
Sodium: 138 mEq/L (ref 135–145)
Total Bilirubin: 0.3 mg/dL (ref 0.2–1.2)
Total Protein: 6.6 g/dL (ref 6.0–8.3)

## 2021-07-02 LAB — CBC WITH DIFFERENTIAL/PLATELET
Basophils Absolute: 0 10*3/uL (ref 0.0–0.1)
Basophils Relative: 0.5 % (ref 0.0–3.0)
Eosinophils Absolute: 0.3 10*3/uL (ref 0.0–0.7)
Eosinophils Relative: 4.6 % (ref 0.0–5.0)
HCT: 35.4 % — ABNORMAL LOW (ref 36.0–46.0)
Hemoglobin: 11.4 g/dL — ABNORMAL LOW (ref 12.0–15.0)
Lymphocytes Relative: 17.2 % (ref 12.0–46.0)
Lymphs Abs: 1.3 10*3/uL (ref 0.7–4.0)
MCHC: 32.3 g/dL (ref 30.0–36.0)
MCV: 88.7 fl (ref 78.0–100.0)
Monocytes Absolute: 0.5 10*3/uL (ref 0.1–1.0)
Monocytes Relative: 6.9 % (ref 3.0–12.0)
Neutro Abs: 5.1 10*3/uL (ref 1.4–7.7)
Neutrophils Relative %: 70.8 % (ref 43.0–77.0)
Platelets: 272 10*3/uL (ref 150.0–400.0)
RBC: 3.99 Mil/uL (ref 3.87–5.11)
RDW: 14.6 % (ref 11.5–15.5)
WBC: 7.3 10*3/uL (ref 4.0–10.5)

## 2021-07-02 LAB — HEMOGLOBIN A1C: Hgb A1c MFr Bld: 7.8 % — ABNORMAL HIGH (ref 4.6–6.5)

## 2021-07-02 MED ORDER — HYDROCODONE-ACETAMINOPHEN 5-325 MG PO TABS: 1.0000 | ORAL_TABLET | Freq: Four times a day (QID) | ORAL | 0 refills | Status: DC | PRN

## 2021-07-02 NOTE — Progress Notes (Signed)
Phone [phone] In person visit   Subjective:   [name] is a 81 y.o. year old very pleasant female patient who presents for/with See problem oriented charting Chief Complaint  Patient presents with   Follow-up   Hypertension   Pain    Pt is in pain and needing pain med, she is taking an old Rx of hydrocodone and it has given her some relief and would like a new Rx.   This visit occurred during the SARS-CoV-2 public health emergency.  Safety protocols were in place, including screening questions prior to the visit, additional usage of staff PPE, and extensive cleaning of exam room while observing appropriate contact time as indicated for disinfecting solutions.   Past Medical History-  Patient Active Problem List   Diagnosis Date Noted   Chronic UTI 02/23/2021    Priority: High   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 11/22/2019    Priority: High   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    Priority: High   Bronchiectasis ([place]) 10/18/2015    Priority: High   Recurrent major depression in full remission ([place]) 01/03/2014    Priority: High   Poorly controlled diabetes mellitus ([place]) 12/28/2006    Priority: High   Adenomatous polyp 12/20/2018    Priority: Medium    Personal history of colonic polyps 05/18/2017    Priority: Medium    Aortic atherosclerosis ([place]) 02/16/2016    Priority: Medium    Solitary pulmonary nodule 01/08/2016    Priority: Medium    IBS (irritable bowel syndrome) 07/17/2014    Priority: Medium    Essential hypertension 01/03/2014    Priority: Medium    Primary open-angle glaucoma 08/02/2012    Priority: Medium    Fatty liver 03/18/2009    Priority: Medium    ANEMIA, B12 DEFICIENCY 12/29/2006    Priority: Medium    RESTLESS LEG SYNDROME, MILD 12/28/2006    Priority: Medium    Osteoporosis 12/12/2006    Priority: Medium    History of total knee replacement, bilateral 11/14/2017    Priority: Low   Arthritis of midfoot 03/03/2016     Priority: Low   Hammertoes of both feet 03/03/2016    Priority: Low   Posterior tibial tendon dysfunction 01/03/2014    Priority: Low   Osteoarthritis, knee 01/03/2014    Priority: Low   Glaucoma 01/03/2014    Priority: Low   Obesity (BMI 30-39.9) 06/15/2013    Priority: Low   Foot tendinitis 07/20/2010    Priority: Low   INSOMNIA, CHRONIC 07/25/2009    Priority: Low   GERD 12/25/2007    Priority: Low   Idiopathic peripheral neuropathy 12/28/2006    Priority: Low   BREAST CANCER, HX OF 12/12/2006    Priority: Low   Right lower quadrant abdominal pain 06/16/2021   Chronic respiratory failure with hypoxia ([place]) 04/29/2021   Fatigue 04/29/2021   Hospital discharge follow-up 04/21/2021   Perineal sinus 03/25/2021   Community acquired pneumonia 03/21/2021   Heart palpitations 02/11/2021   Ankle edema, bilateral 02/11/2021   Hoarseness 10/17/2020   Physical deconditioning 02/22/2019   SOB (shortness of breath) on exertion 02/22/2019   Pain due to onychomycosis of toenails of both feet 11/08/2018    Medications- reviewed and updated Current Outpatient Medications  Medication Sig Dispense Refill   ACCU-CHEK GUIDE test strip USE 1 TO TEST UP TO TWICE DAILY 200 each 0   albuterol (PROVENTIL) (2.5 MG/3ML) 0.083% nebulizer solution Take 3 mLs (2.5 mg total) by nebulization  every 6 (six) hours as needed for wheezing or shortness of breath. 75 mL 12   albuterol (VENTOLIN HFA) 108 (90 Base) MCG/ACT inhaler Inhale 2 puffs into the lungs every 6 (six) hours as needed for wheezing or shortness of breath. 8 g 2   amoxicillin-clavulanate (AUGMENTIN) 875-125 MG tablet Take 1 tablet by mouth 2 (two) times daily for 7 days. Keflex allergy but reports tolerating augmentin in past 20 tablet 0   Bacillus Coagulans-Inulin (ALIGN PREBIOTIC-PROBIOTIC PO) Take 1 capsule by mouth daily.      blood glucose meter kit and supplies KIT Dispense based on patient and insurance preference. Use up to four times  daily as directed. Dx E11.9 1 each 0   budesonide-formoterol (SYMBICORT) 80-4.5 MCG/ACT inhaler Inhale 2 puffs into the lungs 2 (two) times daily. 1 each 3   cetirizine (ZYRTEC) 10 MG tablet Take 10 mg by mouth every morning.      Cholecalciferol (VITAMIN D3) 125 MCG (5000 UT) TABS Take 5,000 Units by mouth daily.     Coenzyme Q10 (COQ10) 200 MG CAPS Take 200 mg by mouth daily.     colesevelam (WELCHOL) 625 MG tablet Take 1 tablet (625 mg total) by mouth 3 (three) times daily. Need office visit. 90 tablet 0   desvenlafaxine (PRISTIQ) 50 MG 24 hr tablet Take 1 tablet (50 mg total) by mouth daily. 90 tablet 3   dicyclomine (BENTYL) 20 MG tablet TAKE 1 TABLET BY MOUTH IN THE MORNING ,  AT  NOON,  IN  THE  EVENING,  AND  AT  BEDTIME 120 tablet 0   dorzolamide-timolol (COSOPT) 22.3-6.8 MG/ML ophthalmic solution Place 1 drop into both eyes 2 (two) times daily.      ESTRING 2 MG vaginal ring INSERT 1 RING  INTO VAGINA EVERY 3 MONTHS 1 each 4   FIBER PO Take 2 tablets by mouth every evening.     fluticasone (FLONASE) 50 MCG/ACT nasal spray Place 1 spray into both nostrils daily. 18.2 mL 2   gabapentin (NEURONTIN) 100 MG capsule TAKE 1 CAPSULE BY MOUTH THREE TIMES DAILY AS NEEDED FOR  NEUROPATHIC  PAIN. (Patient taking differently: Take 200 mg by mouth 2 (two) times daily.) 270 capsule 3   glucose blood (ACCU-CHEK GUIDE) test strip USE  ONCE DAILY TO CHECK GLUCOSE 100 each 0   insulin degludec (TRESIBA) 200 UNIT/ML FlexTouch Pen Inject 60-80 Units into the skin in the morning. (Patient taking differently: Inject 80 Units into the skin in the morning.) 4 mL 11   Insulin Pen Needle (NOVOFINE PLUS PEN NEEDLE) 32G X 4 MM MISC 1 each by Does not apply route daily. 30 each 5   Lancets Misc. (ACCU-CHEK FASTCLIX LANCET) KIT Use to test blood sugars daily. Dx: E11.9 1 kit 5   latanoprost (XALATAN) 0.005 % ophthalmic solution Place 1 drop into the right eye at bedtime.      lovastatin (MEVACOR) 10 MG tablet Take 1  tablet (10 mg total) by mouth daily. 90 tablet 3   montelukast (SINGULAIR) 10 MG tablet Take 1 tablet (10 mg total) by mouth at bedtime. 30 tablet 11   Multiple Vitamin (MULTIVITAMIN WITH MINERALS) TABS tablet Take 1 tablet by mouth daily. 3 Fruit Capsules 3 Vegetable Capsules - Balance of Nature 30 tablet 0   pramipexole (MIRAPEX) 0.5 MG tablet TAKE 1 TABLET BY MOUTH AT BEDTIME 90 tablet 0   Respiratory Therapy Supplies (FLUTTER) DEVI Use as directed 1 each 0   ROCKLATAN 0.02-0.005 %  SOLN Place 1 drop into the left eye at bedtime.      Selenium 200 MCG CAPS Take 200 mcg by mouth at bedtime.      Spacer/Aero-Holding Chambers DEVI Use spacer with inhaler therapy 1 each 2   temazepam (RESTORIL) 15 MG capsule TAKE 1 CAPSULE BY MOUTH AT BEDTIME AS NEEDED FOR SLEEP (Patient taking differently: Take 15 mg by mouth at bedtime.) 30 capsule 5   UNABLE TO FIND Take 2 tablets by mouth at bedtime. Fiber therapy     valsartan (DIOVAN) 80 MG tablet Take 1 tablet (80 mg total) by mouth daily. 90 tablet 3   vitamin B-12 (CYANOCOBALAMIN) 1000 MCG tablet Take 1,000 mcg by mouth daily.     HYDROcodone-acetaminophen (NORCO/VICODIN) 5-325 MG tablet Take 1 tablet by mouth every 6 (six) hours as needed. 20 tablet 0   No current facility-administered medications for this visit.     Objective:  BP 122/64    Pulse 77    Temp 98 F (36.7 C)    Ht 6' (1.829 m)    Wt 245 lb (111.1 kg)    LMP 05/10/1992 Comment: partial   SpO2 95%    BMI 33.23 kg/m  Gen: NAD, resting comfortably CV: RRR no murmurs rubs or gallops Lungs: CTAB no crackles, wheeze, rhonchi Ext: no edema Skin: warm, dry Neuro: in wheelchair    Assessment and Plan    #no oxygen in 3 weeks and feels fine and home levels have been mid 90s! Doing much better!   # Diabetes S: Medication: Tresiba 70 units as well as glimepiride 8 mg -Metformin upset stomach.  Jardiance not a good choice with UTIs.  Plan was to consider Ozempic CBGs- in the last month  peak at 165 but in last 3 weeks highest 153 and tends to average probably 110s or 120s.  Exercise and diet- last visit was having hard time cutting ice cream- doing sherbet instead but still not ideal- ice cream once a week but now eating donuts- at least 1 a day Lab Results  Component Value Date   HGBA1C 8.4 (H) 03/09/2021   HGBA1C 8.5 (H) 12/18/2020   HGBA1C 8.3 (H) 08/11/2020   A/P: hopefully improved- update a1c today. Continue current meds for now. Really needs to cut down/out sweets  #hypertension S: medication: valsartan 80 mg (off lisinopril due to cough/pulm issues) Home readings #s: not checking BP Readings from Last 3 Encounters:  07/02/21 122/64  06/22/21 (!) 152/78  06/16/21 130/80  A/P: improved on repeat/controlled- continue curren tmeds  #hyperlipidemia with aortic atherosclerosis S: Medication:Lovastatin 10 mg daily Lab Results  Component Value Date   CHOL 125 12/18/2020   HDL 43.60 12/18/2020   LDLCALC 61 12/18/2020   LDLDIRECT 77.0 02/16/2016   TRIG 102.0 12/18/2020   CHOLHDL 3 12/18/2020   A/P: Excellent control last check given for aortic atherosclerosis which is presumed stable-continue current medication   #Restless legs S: Plan at last visit was to move Mirapex 0.5 mg to 2 hours before bed and then continue 0.25 mg right before bed if needed-max dose 0.75 mg.  She has been up to 1 mg on her own but we discussed potential risks of this with augmentation -She was also interested at last visit and changing temazepam potentially A/P: better with adjustment after last visit- continue current meds    # Depression S: Medication:Pristiq 50 mg Depression screen [facility] 2/9 07/02/2021 03/04/2021 03/04/2021  Decreased Interest 0 1 0  Down, Depressed, Hopeless 0  1 0  PHQ - 2 Score 0 2 0  Altered sleeping 0 0 -  Tired, decreased energy 0 2 -  Change in appetite 0 0 -  Feeling bad or failure about yourself  0 0 -  Trouble concentrating 0 0 -  Moving slowly or  fidgety/restless 0 0 -  Suicidal thoughts 0 0 -  PHQ-9 Score 0 4 -  Difficult doing work/chores Not difficult at all Somewhat difficult -  Some recent data might be hidden  A/P: Remains in for mission-continue current medicine    #Pyelonephritis ? probable based on CT "Mild right urothelial hyperenhancement seen at the renal pelvis, findings can be seen in the setting of infection. Correlate with urinalysis."-started on Augmentin about 10 days (doesn't tolerate quinolones) ago and got ceftriaxone injection-she reports having right lateral flank pain rather severe radiating to her right lower abdomen (taking Hydrocodone 1 in AM and PM) and has not improved.  Unfortunately recurrent issues despite prophylaxis.  She saw Dr. Johney Maine on 06/29/2021-he recommended follow-up with urology for recurrent UTIs but felt like prior surgery had healed well  -has appointment Monday with Dr. Tresa Moore - at this point with ongoing pain I agree we need to finish the antibiotics nad follow up with urology- not sure what else can be done at this point. Short term pain medicine refill provided for hydrocodone.  -some constipation on hydrocodone- tends toward diarrhea- she did have to take milk of magnesia  #neuropathy- on gabapentin 100 mg twice daily- feels like doing reasonably well -some after chemotherapy and could be from diabetes as well- feeling some in fingers  Recommended follow up: Return in about 14 weeks (around 10/08/2021). Future Appointments  Date Time Provider [place]  07/16/2021  2:30 PM Ritta Slot [facility]  07/29/2021  2:30 PM Byrum, Rose Fillers, MD [facility] None   Lab/Order associations:   ICD-10-CM   1. Poorly controlled diabetes mellitus (HCC)  E11.65 CBC with Differential/Platelet    Comprehensive metabolic panel    Hemoglobin A1c    2. Recurrent major depression in full remission ([place]) Chronic F33.42     3. Aortic atherosclerosis (HCC) Chronic I70.0     4.  Idiopathic peripheral neuropathy  G60.9     5. Diabetic polyneuropathy associated with type 2 diabetes mellitus (HCC)  E11.42       Meds ordered this encounter  Medications   HYDROcodone-acetaminophen (NORCO/VICODIN) 5-325 MG tablet    Sig: Take 1 tablet by mouth every 6 (six) hours as needed.    Dispense:  20 tablet    Refill:  0    Return precautions advised.  [name], MD

## 2021-07-02 NOTE — Patient Instructions (Addendum)
Please stop by lab before you go If you have mychart- we will send your results within 3 business days of [name] receiving them.  If you do not have mychart- we will call you about results within 5 business days of [name] receiving them.  *please also note that you will see labs on mychart as soon as they post. I will later go in and write notes on them- will say "notes from Dr. Yong Channel"   Recommended follow up: Return in about 14 weeks (around 10/08/2021). Also schedule physical 3 months and 1 day out from next visit (so schedule 2 visits into the future before you leave today)

## 2021-07-06 ENCOUNTER — Ambulatory Visit: Payer: Medicare PPO | Admitting: Gastroenterology

## 2021-07-06 DIAGNOSIS — N3020 Other chronic cystitis without hematuria: Secondary | ICD-10-CM | POA: Diagnosis not present

## 2021-07-06 DIAGNOSIS — N200 Calculus of kidney: Secondary | ICD-10-CM | POA: Diagnosis not present

## 2021-07-07 ENCOUNTER — Encounter: Payer: Self-pay | Admitting: Family Medicine

## 2021-07-09 ENCOUNTER — Ambulatory Visit: Payer: Medicare PPO | Admitting: Family Medicine

## 2021-07-14 ENCOUNTER — Telehealth: Payer: Self-pay | Admitting: Family Medicine

## 2021-07-14 ENCOUNTER — Other Ambulatory Visit: Payer: Self-pay | Admitting: Urology

## 2021-07-14 DIAGNOSIS — N3020 Other chronic cystitis without hematuria: Secondary | ICD-10-CM | POA: Diagnosis not present

## 2021-07-14 DIAGNOSIS — N200 Calculus of kidney: Secondary | ICD-10-CM | POA: Diagnosis not present

## 2021-07-14 NOTE — Telephone Encounter (Signed)
Dr Claudia Desanctis requesting sx clearance for patient- Sx date 07/20/21. (Shock wave lithotripsy) ? ?Last DOS with Dr Yong Channel 07/02/21.  ? ? ?[facility] [phone] Ext [phone] ?           Fax [phone]  ? ? ?[facility] faxed over clearance form on 07/14/21.  ?

## 2021-07-14 NOTE — Telephone Encounter (Signed)
Terry with [facility] wanted it noted patient missed PT on 07/09/21 due to other appts. ?

## 2021-07-14 NOTE — Telephone Encounter (Signed)
Noted, will be on the look out for form.  ?

## 2021-07-15 ENCOUNTER — Telehealth: Payer: Self-pay | Admitting: Family Medicine

## 2021-07-15 NOTE — Telephone Encounter (Signed)
Gracee from home health [phone]. ? ?Patient is having pain on upper back side- has kidney stones and is being treated for kidney stones- Kidney stone removal is on Monday 3/13 per therapist.  ? ?Therapist stated her pain is 10/10 however she is still able to move around and talk. -  ? ?Today her BP is 170/62 and 170/65 ? ?Yesterday her  BP was 190+  at urologist.  ?

## 2021-07-15 NOTE — Telephone Encounter (Signed)
Noted  

## 2021-07-16 ENCOUNTER — Ambulatory Visit: Payer: Medicare PPO | Admitting: Gastroenterology

## 2021-07-16 ENCOUNTER — Ambulatory Visit (INDEPENDENT_AMBULATORY_CARE_PROVIDER_SITE_OTHER): Payer: Medicare PPO | Admitting: Family Medicine

## 2021-07-16 ENCOUNTER — Telehealth: Payer: Self-pay | Admitting: Family Medicine

## 2021-07-16 ENCOUNTER — Encounter: Payer: Self-pay | Admitting: Family Medicine

## 2021-07-16 VITALS — BP 160/74 | HR 79 | Temp 98.6°F | Resp 94 | Ht 72.0 in | Wt 244.9 lb

## 2021-07-16 DIAGNOSIS — R600 Localized edema: Secondary | ICD-10-CM

## 2021-07-16 DIAGNOSIS — E1165 Type 2 diabetes mellitus with hyperglycemia: Secondary | ICD-10-CM | POA: Diagnosis not present

## 2021-07-16 DIAGNOSIS — I10 Essential (primary) hypertension: Secondary | ICD-10-CM

## 2021-07-16 MED ORDER — FUROSEMIDE 20 MG PO TABS: 20.0000 mg | ORAL_TABLET | Freq: Every day | ORAL | 0 refills | Status: DC | PRN

## 2021-07-16 NOTE — Telephone Encounter (Signed)
FYI

## 2021-07-16 NOTE — Patient Instructions (Addendum)
Please stop by lab before you go ?If you have mychart- we will send your results within 3 business days of [name] receiving them.  ?If you do not have mychart- we will call you about results within 5 business days of [name] receiving them.  ?*please also note that you will see labs on mychart as soon as they post. I will later go in and write notes on them- will say "notes from Dr. Yong Channel"  ? ?Short term I want to try valsartan 80 mg twice daily instead of once daily- want to get your blood pressure lower. I am not changing your prescription on the valsartan yet because I do not know if you will need this higher dose once you get the stone removed and some fluid off- we may be able to go right back down to '80mg'$  ? ?I am also going to give you lasix 20 mg- I want you to see if you can tolerate a dose of this tomorrow and if you do well with this tomorrow can take a dose on Saturday and Sunday as well in the morning. Do not take on day of procedure.  ? ?I will send in form for procedure stating you can have this procedure as long as BP <150/90 and improvement in swelling ? ?Recommended follow up: Return for as needed for new, worsening, persistent symptoms. ?

## 2021-07-16 NOTE — Progress Notes (Signed)
Patient pre-op phone call complete. Patient medications, medical history, and allergies verified. Patient instructed not to take insulin the morning of surgery. Patient can have clear liquids until 0830 and solids until 0630. Patients has restricted left extremity. All blood pressures and needle sticks on right side. Driver secured ?

## 2021-07-16 NOTE — Telephone Encounter (Signed)
Called and spoke with pt and she is aware to come as soon as possible.  ?

## 2021-07-16 NOTE — Telephone Encounter (Signed)
Pt called in and is asking for a call back asap. She stated it was important and time sensitive. Please call back at [phone] ?

## 2021-07-16 NOTE — Telephone Encounter (Signed)
She is on valsartan 80 mg ? ?Can take a second dose in the evenings if taking first dose in AM ? ?Also does her BP stay high when she is at max level of improved pain? I suspect some of BP being high is related to her pressure ? ?

## 2021-07-16 NOTE — Telephone Encounter (Signed)
Returned pt call and pt c/o bilateral feet and leg swelling since Saturday along with dry cough and she was told to inform you to see what needs to be done since she is scheduled to have her lithotripsy on Monday. Would you want to work pt in tomorrow? ?

## 2021-07-16 NOTE — Progress Notes (Signed)
Phone [phone] In person visit   Subjective:   [name] is a 81 y.o. year old very pleasant female patient who presents for/with See problem oriented charting Chief Complaint  Patient presents with   Hypertension   Leg Swelling    Started Saturday. Pt is worried about getting her surgery on Monday. She denies pain.   This visit occurred during the SARS-CoV-2 public health emergency.  Safety protocols were in place, including screening questions prior to the visit, additional usage of staff PPE, and extensive cleaning of exam room while observing appropriate contact time as indicated for disinfecting solutions.   Past Medical History-  Patient Active Problem List   Diagnosis Date Noted   Chronic UTI 02/23/2021    Priority: High   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 11/22/2019    Priority: High   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    Priority: High   Bronchiectasis ([place]) 10/18/2015    Priority: High   Recurrent major depression in full remission ([place]) 01/03/2014    Priority: High   Poorly controlled diabetes mellitus ([place]) 12/28/2006    Priority: High   Adenomatous polyp 12/20/2018    Priority: Medium    Personal history of colonic polyps 05/18/2017    Priority: Medium    Aortic atherosclerosis ([place]) 02/16/2016    Priority: Medium    Solitary pulmonary nodule 01/08/2016    Priority: Medium    IBS (irritable bowel syndrome) 07/17/2014    Priority: Medium    Essential hypertension 01/03/2014    Priority: Medium    Primary open-angle glaucoma 08/02/2012    Priority: Medium    Fatty liver 03/18/2009    Priority: Medium    ANEMIA, B12 DEFICIENCY 12/29/2006    Priority: Medium    RESTLESS LEG SYNDROME, MILD 12/28/2006    Priority: Medium    Osteoporosis 12/12/2006    Priority: Medium    History of total knee replacement, bilateral 11/14/2017    Priority: Low   Arthritis of midfoot 03/03/2016    Priority: Low   Hammertoes of both feet 03/03/2016     Priority: Low   Posterior tibial tendon dysfunction 01/03/2014    Priority: Low   Osteoarthritis, knee 01/03/2014    Priority: Low   Glaucoma 01/03/2014    Priority: Low   Obesity (BMI 30-39.9) 06/15/2013    Priority: Low   Foot tendinitis 07/20/2010    Priority: Low   INSOMNIA, CHRONIC 07/25/2009    Priority: Low   GERD 12/25/2007    Priority: Low   Idiopathic peripheral neuropathy 12/28/2006    Priority: Low   BREAST CANCER, HX OF 12/12/2006    Priority: Low   Right lower quadrant abdominal pain 06/16/2021   Chronic respiratory failure with hypoxia ([place]) 04/29/2021   Fatigue 04/29/2021   Hospital discharge follow-up 04/21/2021   Perineal sinus 03/25/2021   Community acquired pneumonia 03/21/2021   Heart palpitations 02/11/2021   Ankle edema, bilateral 02/11/2021   Hoarseness 10/17/2020   Physical deconditioning 02/22/2019   SOB (shortness of breath) on exertion 02/22/2019   Pain due to onychomycosis of toenails of both feet 11/08/2018    Medications- reviewed and updated Current Outpatient Medications  Medication Sig Dispense Refill   ACCU-CHEK GUIDE test strip USE 1 TO TEST UP TO TWICE DAILY 200 each 0   albuterol (PROVENTIL) (2.5 MG/3ML) 0.083% nebulizer solution Take 3 mLs (2.5 mg total) by nebulization every 6 (six) hours as needed for wheezing or shortness of breath. 75 mL 12  albuterol (VENTOLIN HFA) 108 (90 Base) MCG/ACT inhaler Inhale 2 puffs into the lungs every 6 (six) hours as needed for wheezing or shortness of breath. 8 g 2   Bacillus Coagulans-Inulin (ALIGN PREBIOTIC-PROBIOTIC PO) Take 1 capsule by mouth daily.      blood glucose meter kit and supplies KIT Dispense based on patient and insurance preference. Use up to four times daily as directed. Dx E11.9 1 each 0   budesonide-formoterol (SYMBICORT) 80-4.5 MCG/ACT inhaler Inhale 2 puffs into the lungs 2 (two) times daily. 1 each 3   cetirizine (ZYRTEC) 10 MG tablet Take 10 mg by mouth every morning.       Cholecalciferol (VITAMIN D3) 125 MCG (5000 UT) TABS Take 5,000 Units by mouth daily.     Coenzyme Q10 (COQ10) 200 MG CAPS Take 200 mg by mouth daily.     colesevelam (WELCHOL) 625 MG tablet Take 1 tablet (625 mg total) by mouth 3 (three) times daily. Need office visit. 90 tablet 0   desvenlafaxine (PRISTIQ) 50 MG 24 hr tablet Take 1 tablet (50 mg total) by mouth daily. 90 tablet 3   dicyclomine (BENTYL) 20 MG tablet TAKE 1 TABLET BY MOUTH IN THE MORNING ,  AT  NOON,  IN  THE  EVENING,  AND  AT  BEDTIME 120 tablet 0   dorzolamide-timolol (COSOPT) 22.3-6.8 MG/ML ophthalmic solution Place 1 drop into both eyes 2 (two) times daily.      ESTRING 2 MG vaginal ring INSERT 1 RING  INTO VAGINA EVERY 3 MONTHS 1 each 4   FIBER PO Take 2 tablets by mouth every evening.     fluticasone (FLONASE) 50 MCG/ACT nasal spray Place 1 spray into both nostrils daily. 18.2 mL 2   furosemide (LASIX) 20 MG tablet Take 1 tablet (20 mg total) by mouth daily as needed for fluid or edema. 30 tablet 0   gabapentin (NEURONTIN) 100 MG capsule TAKE 1 CAPSULE BY MOUTH THREE TIMES DAILY AS NEEDED FOR  NEUROPATHIC  PAIN. (Patient taking differently: Take 200 mg by mouth 2 (two) times daily.) 270 capsule 3   glucose blood (ACCU-CHEK GUIDE) test strip USE  ONCE DAILY TO CHECK GLUCOSE 100 each 0   HYDROcodone-acetaminophen (NORCO/VICODIN) 5-325 MG tablet Take 1 tablet by mouth every 6 (six) hours as needed. 20 tablet 0   insulin degludec (TRESIBA) 200 UNIT/ML FlexTouch Pen Inject 60-80 Units into the skin in the morning. (Patient taking differently: Inject 80 Units into the skin in the morning.) 4 mL 11   Insulin Pen Needle (NOVOFINE PLUS PEN NEEDLE) 32G X 4 MM MISC 1 each by Does not apply route daily. 30 each 5   Lancets Misc. (ACCU-CHEK FASTCLIX LANCET) KIT Use to test blood sugars daily. Dx: E11.9 1 kit 5   latanoprost (XALATAN) 0.005 % ophthalmic solution Place 1 drop into the right eye at bedtime.      lovastatin (MEVACOR) 10 MG  tablet Take 1 tablet (10 mg total) by mouth daily. 90 tablet 3   montelukast (SINGULAIR) 10 MG tablet Take 1 tablet (10 mg total) by mouth at bedtime. 30 tablet 11   Multiple Vitamin (MULTIVITAMIN WITH MINERALS) TABS tablet Take 1 tablet by mouth daily. 3 Fruit Capsules 3 Vegetable Capsules - Balance of Nature 30 tablet 0   pramipexole (MIRAPEX) 0.5 MG tablet TAKE 1 TABLET BY MOUTH AT BEDTIME 90 tablet 0   Respiratory Therapy Supplies (FLUTTER) DEVI Use as directed 1 each 0   ROCKLATAN 0.02-0.005 % SOLN  Place 1 drop into the left eye at bedtime.      Selenium 200 MCG CAPS Take 200 mcg by mouth at bedtime.      Spacer/Aero-Holding Chambers DEVI Use spacer with inhaler therapy 1 each 2   temazepam (RESTORIL) 15 MG capsule TAKE 1 CAPSULE BY MOUTH AT BEDTIME AS NEEDED FOR SLEEP (Patient taking differently: Take 15 mg by mouth at bedtime.) 30 capsule 5   UNABLE TO FIND Take 2 tablets by mouth at bedtime. Fiber therapy     valsartan (DIOVAN) 80 MG tablet Take 1 tablet (80 mg total) by mouth daily. 90 tablet 3   vitamin B-12 (CYANOCOBALAMIN) 1000 MCG tablet Take 1,000 mcg by mouth daily.     atenolol (TENORMIN) 25 MG tablet Take 25 mg by mouth daily.     No current facility-administered medications for this visit.     Objective:  BP (!) 160/74 Comment: Husband states pills were take late today.   Pulse 79    Temp 98.6 F (37 C) (Temporal)    Resp (!) 94    Ht 6' (1.829 m)    Wt 244 lb 14.9 oz (111.1 kg)    LMP 05/10/1992 Comment: partial   BMI 33.22 kg/m  Gen: NAD, resting comfortably CV: RRR no murmurs rubs or gallops Lungs: CTAB no crackles, wheeze, rhonchi Abdomen: soft/nontender/nondistended/normal bowel sounds. No rebound or guarding.  Ext: 1+ edema at least bilaterally Skin: warm, dry    Assessment and Plan   #Presurgical evaluation S: Patient will be undergoing right extracorporal shockwave lithotripsy she is hoping on Monday.  Patient is not able to complete 4 METS of activity  without shortness of breath due to sedentary activity and orthopedic issues-she did have a cardiology evaluation in 2018 with Dr. Marlou Porch prior to her knee surgery and was cleared at that time.  Fortunately she reports she will not need general anesthesia for this procedure A/P: Since patient will not need general anesthesia and this is overall low risk procedure-I believe she is medically maximized ( should consider current respiratory treatments- I do not think she requires pulm clearance as well with her stability noted today) as long as we get her blood pressure down further-I would prefer at least less than 150/90 if not lower.  Only other disqualify her I would see is if she had a significantly elevated proBNP (pending)  #Diabetes mellitus-I think since her A1c is at least under 8 we can proceed with surgery.  We will consider this reasonable control-should hold glimepiride on day of surgery.  Would reduce Tresiba to 40 units on day of surgery   #hypertension S: medication: valsartan 80mg  (cough/pulm issues on lisinopril) , atenolol 25 mg -not currently in much pain from stone 0.9 mm in size. No general sedation.  Home readings #s: has home cuff BP Readings from Last 3 Encounters:  07/16/21 (!) 160/74  07/02/21 122/64  06/22/21 (!) 152/78  A/P: Blood pressure significantly elevated but I wonder if some of this is related to pain she is experienced recently (though not actively having pain right now) or fluid overload -Trial valsartan up to 80mg  BID -lasix short term for edema as below -update me in 1 week with BP -I did tell her I thought it be reasonable to proceed with surgery as long as blood pressure is less than 150/90 and having some improvement in swelling-though prefer less than 140/90  # bilateral edema S:echocardiogram 11/27/20 and noted diastolic dysfunction with EF 60-65%. Was having SOB  and swelling in legs at that time  A/P: Patient may have some mild diastolic CHF based off  prior diastolic dysfunction -watch salt intake - elevate legs -short term lasix trial -she is not sure if she can tolerate Lasix since she already has urinary frequency and incontinence (no burning).  See urology note-they have already addressed her concerns and believe it is more likely colonization at this point -Also update labs including proBNP, CBC, CMP, TSH-if elevation of proBNP consider postponing surgery and having patient see cardiology  Recommended follow up: Return for as needed for new, worsening, persistent symptoms. Future Appointments  Date Time Provider [place]  07/29/2021  2:30 PM Collene Gobble, MD [facility] None  10/09/2021  1:40 PM Marin Olp, MD [facility]  12/31/2021  1:00 PM Marin Olp, MD [facility]    Lab/Order associations:   ICD-10-CM   1. Essential hypertension  I10 CBC with Differential/Platelet    Comprehensive metabolic panel    TSH    2. Bilateral edema of lower extremity  R60.0 Pro b natriuretic peptide (BNP)    3. Poorly controlled diabetes mellitus ([facility])  E11.65       Meds ordered this encounter  Medications   furosemide (LASIX) 20 MG tablet    Sig: Take 1 tablet (20 mg total) by mouth daily as needed for fluid or edema.    Dispense:  30 tablet    Refill:  0    Return precautions advised.  [name], MD

## 2021-07-16 NOTE — Telephone Encounter (Signed)
See below

## 2021-07-17 LAB — COMPREHENSIVE METABOLIC PANEL
ALT: 10 U/L (ref 0–35)
AST: 13 U/L (ref 0–37)
Albumin: 3.6 g/dL (ref 3.5–5.2)
Alkaline Phosphatase: 67 U/L (ref 39–117)
BUN: 14 mg/dL (ref 6–23)
CO2: 32 mEq/L (ref 19–32)
Calcium: 9.2 mg/dL (ref 8.4–10.5)
Chloride: 104 mEq/L (ref 96–112)
Creatinine, Ser: 0.58 mg/dL (ref 0.40–1.20)
GFR: 84.82 mL/min (ref >60.00)
Glucose, Bld: 166 mg/dL — ABNORMAL HIGH (ref 70–99)
Potassium: 3.6 mEq/L (ref 3.5–5.1)
Sodium: 144 mEq/L (ref 135–145)
Total Bilirubin: 0.4 mg/dL (ref 0.2–1.2)
Total Protein: 6.5 g/dL (ref 6.0–8.3)

## 2021-07-17 LAB — CBC WITH DIFFERENTIAL/PLATELET
Basophils Absolute: 0.1 10*3/uL (ref 0.0–0.1)
Basophils Relative: 0.5 % (ref 0.0–3.0)
Eosinophils Absolute: 0.5 10*3/uL (ref 0.0–0.7)
Eosinophils Relative: 4.7 % (ref 0.0–5.0)
HCT: 36.2 % (ref 36.0–46.0)
Hemoglobin: 11.9 g/dL — ABNORMAL LOW (ref 12.0–15.0)
Lymphocytes Relative: 12.3 % (ref 12.0–46.0)
Lymphs Abs: 1.2 10*3/uL (ref 0.7–4.0)
MCHC: 32.9 g/dL (ref 30.0–36.0)
MCV: 88.6 fl (ref 78.0–100.0)
Monocytes Absolute: 0.6 10*3/uL (ref 0.1–1.0)
Monocytes Relative: 6.3 % (ref 3.0–12.0)
Neutro Abs: 7.7 10*3/uL (ref 1.4–7.7)
Neutrophils Relative %: 76.2 % (ref 43.0–77.0)
Platelets: 253 10*3/uL (ref 150.0–400.0)
RBC: 4.08 Mil/uL (ref 3.87–5.11)
RDW: 15.1 % (ref 11.5–15.5)
WBC: 10.1 10*3/uL (ref 4.0–10.5)

## 2021-07-17 LAB — PRO B NATRIURETIC PEPTIDE: NT-Pro BNP: 105 pg/mL (ref 0–738)

## 2021-07-17 LAB — TSH: TSH: 2.68 u[IU]/mL (ref 0.35–5.50)

## 2021-07-17 NOTE — Telephone Encounter (Signed)
Called and lm on [name] vm tcb. ?

## 2021-07-18 ENCOUNTER — Other Ambulatory Visit: Payer: Self-pay | Admitting: Family

## 2021-07-18 ENCOUNTER — Encounter: Payer: Self-pay | Admitting: Family Medicine

## 2021-07-18 DIAGNOSIS — R0602 Shortness of breath: Secondary | ICD-10-CM

## 2021-07-19 ENCOUNTER — Other Ambulatory Visit: Payer: Self-pay | Admitting: Family Medicine

## 2021-07-20 ENCOUNTER — Encounter (HOSPITAL_BASED_OUTPATIENT_CLINIC_OR_DEPARTMENT_OTHER)
Admission: RE | Disposition: A | Discharge status: Home / Self Care | Payer: Self-pay | Source: Home / Self Care | Attending: Urology

## 2021-07-20 ENCOUNTER — Other Ambulatory Visit: Payer: Self-pay

## 2021-07-20 ENCOUNTER — Ambulatory Visit (HOSPITAL_COMMUNITY): Payer: Medicare PPO

## 2021-07-20 ENCOUNTER — Ambulatory Visit (HOSPITAL_BASED_OUTPATIENT_CLINIC_OR_DEPARTMENT_OTHER)
Admission: RE | Admit: 2021-07-20 | Discharge: 2021-07-20 | Disposition: A | Discharge status: Home / Self Care | Payer: Medicare PPO | Attending: Urology | Admitting: Urology

## 2021-07-20 ENCOUNTER — Encounter (HOSPITAL_BASED_OUTPATIENT_CLINIC_OR_DEPARTMENT_OTHER): Payer: Self-pay | Admitting: Urology

## 2021-07-20 DIAGNOSIS — N201 Calculus of ureter: Secondary | ICD-10-CM | POA: Insufficient documentation

## 2021-07-20 DIAGNOSIS — K6389 Other specified diseases of intestine: Secondary | ICD-10-CM | POA: Diagnosis not present

## 2021-07-20 DIAGNOSIS — J45909 Unspecified asthma, uncomplicated: Secondary | ICD-10-CM | POA: Insufficient documentation

## 2021-07-20 DIAGNOSIS — M47816 Spondylosis without myelopathy or radiculopathy, lumbar region: Secondary | ICD-10-CM | POA: Diagnosis not present

## 2021-07-20 DIAGNOSIS — Z853 Personal history of malignant neoplasm of breast: Secondary | ICD-10-CM | POA: Insufficient documentation

## 2021-07-20 DIAGNOSIS — E119 Type 2 diabetes mellitus without complications: Secondary | ICD-10-CM | POA: Diagnosis not present

## 2021-07-20 DIAGNOSIS — I10 Essential (primary) hypertension: Secondary | ICD-10-CM | POA: Insufficient documentation

## 2021-07-20 DIAGNOSIS — N200 Calculus of kidney: Secondary | ICD-10-CM

## 2021-07-20 HISTORY — PX: EXTRACORPOREAL SHOCK WAVE LITHOTRIPSY: SHX1557

## 2021-07-20 LAB — GLUCOSE, CAPILLARY: Glucose-Capillary: 136 mg/dL — ABNORMAL HIGH (ref 70–99)

## 2021-07-20 SURGERY — LITHOTRIPSY, ESWL
Anesthesia: LOCAL | Laterality: Right

## 2021-07-20 MED ORDER — DEXTROSE 5 % IV SOLN: 2000.0000 mg | Freq: Once | INTRAVENOUS | Status: DC

## 2021-07-20 MED ORDER — CIPROFLOXACIN HCL 500 MG PO TABS: 500.0000 mg | ORAL_TABLET | ORAL | Status: DC

## 2021-07-20 MED ORDER — DIPHENHYDRAMINE HCL 25 MG PO CAPS
ORAL_CAPSULE | ORAL | Status: AC
Start: 2021-07-20 — End: ?
  Filled 2021-07-20: qty 1

## 2021-07-20 MED ORDER — SODIUM CHLORIDE 0.9 % IV SOLN: INTRAVENOUS | Status: DC

## 2021-07-20 MED ORDER — TAMSULOSIN HCL 0.4 MG PO CAPS: 0.4000 mg | ORAL_CAPSULE | Freq: Every day | ORAL | 0 refills | Status: DC

## 2021-07-20 MED ORDER — HYDROCODONE-ACETAMINOPHEN 5-325 MG PO TABS
1.0000 | ORAL_TABLET | Freq: Four times a day (QID) | ORAL | 0 refills | Status: DC | PRN
Start: 2021-07-20 — End: 2021-12-31

## 2021-07-20 MED ORDER — CEFAZOLIN SODIUM-DEXTROSE 2-4 GM/100ML-% IV SOLN
INTRAVENOUS | Status: AC
Start: 2021-07-20 — End: ?
  Filled 2021-07-20: qty 100

## 2021-07-20 MED ORDER — DIPHENHYDRAMINE HCL 25 MG PO CAPS
25.0000 mg | ORAL_CAPSULE | ORAL | Status: AC
  Administered 2021-07-20: 25 mg via ORAL

## 2021-07-20 MED ORDER — CEFAZOLIN SODIUM-DEXTROSE 2-4 GM/100ML-% IV SOLN
2.0000 g | Freq: Once | INTRAVENOUS | Status: AC
  Administered 2021-07-20: 2 g via INTRAVENOUS

## 2021-07-20 MED ORDER — DIAZEPAM 5 MG PO TABS
ORAL_TABLET | ORAL | Status: AC
  Filled 2021-07-20: qty 1

## 2021-07-20 MED ORDER — DIAZEPAM 5 MG PO TABS: 10.0000 mg | ORAL_TABLET | ORAL | Status: DC

## 2021-07-20 MED ORDER — DIAZEPAM 5 MG PO TABS
5.0000 mg | ORAL_TABLET | ORAL | Status: AC
  Administered 2021-07-20: 5 mg via ORAL

## 2021-07-20 NOTE — Discharge Instructions (Addendum)
See [facility] discharge instructions in chart.  

## 2021-07-20 NOTE — Interval H&P Note (Signed)
History and Physical Interval Note: ? ?07/20/2021 ?12:31 PM ? ?[name]  has presented today for surgery, with the diagnosis of RIGHT RENAL CALCULUS.  The various methods of treatment have been discussed with the patient and family. After consideration of risks, benefits and other options for treatment, the patient has consented to  Procedure(s): ?EXTRACORPOREAL SHOCK WAVE LITHOTRIPSY (ESWL) (Right) as a surgical intervention.  The patient's history has been reviewed, patient examined, no change in status, stable for surgery.  I have reviewed the patient's chart and labs.  Questions were answered to the patient's satisfaction.   ? ? ?Ardis Hughs ? ? ?

## 2021-07-20 NOTE — Op Note (Signed)
See [facility] OP note scanned into chart. Also because of the size, density, location and other factors that cannot be anticipated I feel this will likely be a staged procedure. This fact supersedes any indication in the scanned [facility] operative note to the contrary.  

## 2021-07-20 NOTE — H&P (Signed)
cc: recurrent UTIs and renal calculus  ? ?07/14/21: 81 year old woman comes in for second opinion of 9 mm right renal calculus. She is also had difficulty with recurrent UTIs and what sounds like asymptomatic bacteriuria. Patient has a significant surgical history and last surgery was anal fistula repair November 2022. Following that surgery she developed urinary retention requiring a trip back to the emergency room and upon returning home tripped and fell which then required outpatient rehab. She has never had difficulty with anesthesia and does not take any blood thinners. She is here today with her husband Clair Gulling. She continues to have right flank pain intermittently and states the pain is so bad she would rather die than live with the pain. She is using all narcotics to control the pain. She is able to ambulate on her own and with a roller walker as well as transfer. Urine sample today without signs of infection.  ? ?  ?ALLERGIES: Brimonidine ?Cephalexin ?Erythromycin ?Levaquin ?Nitrofurantoin ?  ? ?MEDICATIONS: Zyrtec 10 mg capsule  ?Albuterol Sulfate  ?Albuterol Sulfate Hfa  ?Amoxicillin-Clavulanate Potass 875 mg-125 mg tablet 1 tablet PO BID start 3 days leading up to ESWL procedures  ?Bentyl  ?Budesonide-Formoterol Fumarate 80 mcg-4.5 mcg/actuation hfa aerosol with adapter  ?Coq10  ?Cosopt 22.3 mg-6.8 mg/ml drops  ?Estring  ?Fiber  ?Flonase Allergy Relief  ?Gabapentin 100 mg capsule  ?Hydrocodone-Acetaminophen 5 mg-325 mg tablet  ?Latanoprost 0.005 % drops  ?Lovastatin 10 mg tablet  ?Mirapex  ?Multivitamins With Minerals tablet  ?Pristiq 50 mg tablet, extended release 24 hr  ?Probiotic  ?Rocklatan 0.02 %-0.005 % drops  ?Selenomax 200 mcg tablet  ?Singulair  ?Temazepam 15 mg capsule  ?Tyler Aas Flextouch U-200 200 unit/ml (3 ml) insulin pen  ?Valsartan 80 mg tablet  ?Vitamin B12  ?Vitamin D3 125 mcg (5,000 unit) capsule  ?Welchol 625 mg tablet  ?  ? ?GU PSH: None  ? ?NON-GU PSH: None  ? ?GU PMH: Chronic cystitis  (w/o hematuria) - 07/06/2021 ?Renal calculus - 07/06/2021 ?  ? ?NON-GU PMH: None  ? ?FAMILY HISTORY: 1 daughter- adopted - Daughter ?1 son - Son  ? ?SOCIAL HISTORY: Marital Status: Married ?Preferred Language: [place]; Ethnicity: Not Hispanic Or Latino; Race: White ?Current Smoking Status: Patient has never smoked.  ? ?Tobacco Use Assessment Completed: Used Tobacco in last 30 days? ?Has never drank.  ?Does not drink caffeine. ?  ? ?REVIEW OF SYSTEMS:    ?GU Review Female:   Patient reports stream starts and stops, trouble starting your stream, and have to strain to urinate. Patient denies frequent urination, hard to postpone urination, burning /pain with urination, get up at night to urinate, leakage of urine, and being pregnant.  ?Gastrointestinal (Upper):   Patient denies nausea, vomiting, and indigestion/ heartburn.  ?Gastrointestinal (Lower):   Patient denies diarrhea and constipation.  ?Constitutional:   Patient denies fever, night sweats, weight loss, and fatigue.  ?Skin:   Patient denies skin rash/ lesion and itching.  ?Eyes:   Patient denies blurred vision and double vision.  ?Ears/ Nose/ Throat:   Patient denies sore throat and sinus problems.  ?Hematologic/Lymphatic:   Patient denies swollen glands and easy bruising.  ?Cardiovascular:   Patient denies leg swelling and chest pains.  ?Respiratory:   Patient denies cough and shortness of breath.  ?Endocrine:   Patient denies excessive thirst.  ?Musculoskeletal:   Patient denies back pain and joint pain.  ?Neurological:   Patient denies headaches and dizziness.  ?Psychologic:   Patient denies depression and  anxiety.  ? ?VITAL SIGNS:    ?  07/14/2021 11:06 AM  ?Weight 245 lb / 111.13 kg  ?Height 72 in / 182.88 cm  ?BP 188/84 mmHg  ?Pulse 72 /min  ?Temperature 97.1 F / 36.1 C  ?BMI 33.2 kg/m?  ? ?MULTI-SYSTEM PHYSICAL EXAMINATION:    ?Constitutional: Well-nourished. No physical deformities. Normally developed. Good grooming.  ?Neck: Neck symmetrical, not swollen.  Normal tracheal position.  ?Respiratory: No labored breathing, no use of accessory muscles.   ?Skin: No paleness, no jaundice, no cyanosis. No lesion, no ulcer, no rash.  ?Neurologic / Psychiatric: Oriented to time, oriented to place, oriented to person. No depression, no anxiety, no agitation.  ?Eyes: Normal conjunctivae. Normal eyelids.  ?Ears, Nose, Mouth, and Throat: Left ear no scars, no lesions, no masses. Right ear no scars, no lesions, no masses. Nose no scars, no lesions, no masses. Normal hearing. Normal lips.  ?Musculoskeletal: Normal gait and station of head and neck.  ? ?  ?Complexity of Data:  ?Records Review:   Previous Patient Records, POC Tool  ?Urine Test Review:   Urinalysis  ?X-Ray Review: C.T. Abdomen/Pelvis: Reviewed Films. Reviewed Report. Discussed With Patient.  ?  ? ?PROCEDURES:    ? ?     Urinalysis w/Scope ?Dipstick Dipstick Cont'd Micro  ?Color: Yellow Bilirubin: Neg mg/dL WBC/hpf: 10 - 20/hpf  ?Appearance: Slightly Cloudy Ketones: Neg mg/dL RBC/hpf: NS (Not Seen)  ?Specific Gravity: 1.020 Blood: Neg ery/uL Bacteria: NS (Not Seen)  ?pH: 5.5 Protein: Trace mg/dL Cystals: NS (Not Seen)  ?Glucose: Neg mg/dL Urobilinogen: 0.2 mg/dL Casts: NS (Not Seen)  ?  Nitrites: Neg Trichomonas: Not Present  ?  Leukocyte Esterase: 1+ leu/uL Mucous: Present  ?    Epithelial Cells: 6 - 10/hpf  ?    Yeast: NS (Not Seen)  ?    Sperm: Not Present  ? ? ?ASSESSMENT:  ?    ICD-10 Details  ?1 GU:   Renal calculus - N20.0 Chronic, Stable  ?2   Chronic cystitis (w/o hematuria) - N30.20 Chronic, Stable  ? ?PLAN:    ? ? ?      Medications ?New Meds: Tamsulosin Hcl 0.4 mg capsule 1 capsule PO Q HS start the day of ESWL  #30  0 Refill(s)  ?Pharmacy Name:  [id number]  ?Address:  [address]  ? [place], [place] [address]  ?Phone:  [phone]  ?Fax:  [phone]  ?  ?Refill Meds: Amoxicillin-Clavulanate Potass 875 mg-125 mg tablet 1 tablet PO BID start 3 days leading up to ESWL procedures  #8  0  Refill(s)  ?  ? ? ?      Document ?Letter(s):  Created for Patient: Clinical Summary  ? ? ?     Notes:   1. Renal calculus:  ?-Discussed overall management with 9 mm right renal pelvis stone including observation, ESWL and ureteroscopy  ?-Risks and benefits of ESWL discussed with the patient in detail including but not limited to pain, bleeding, infection, failure to fragment stone, need for additional treatment, risk of Steinstrasse, hematoma, page kidney, retroperitoneal bleed, damage to surrounding structures  ?-Patient is able to ambulate and can transfer onto the bed  ?-She will be scheduled for the next available date with either myself, Dr. Louis Meckel or Dr. Alinda Money  ? ?2. Chronic cystitis:  ?-Discussed with patient the difference between true infection and bacterial colonization  ?-Urinalysis today without significant bacteria  ?-Gave patient informational handout recurrent UTIs and recommended she start  d-mannose/cranberry probiotic; she is already using estradiol ring in the vagina  ? ?Will start patient on antibiotics 3 days leading up to ESWL  ? ?  ? ?

## 2021-07-21 ENCOUNTER — Encounter (HOSPITAL_BASED_OUTPATIENT_CLINIC_OR_DEPARTMENT_OTHER): Payer: Self-pay | Admitting: Urology

## 2021-07-22 DIAGNOSIS — J4520 Mild intermittent asthma, uncomplicated: Secondary | ICD-10-CM | POA: Diagnosis not present

## 2021-07-22 DIAGNOSIS — M179 Osteoarthritis of knee, unspecified: Secondary | ICD-10-CM | POA: Diagnosis not present

## 2021-07-22 DIAGNOSIS — S42309A Unspecified fracture of shaft of humerus, unspecified arm, initial encounter for closed fracture: Secondary | ICD-10-CM | POA: Diagnosis not present

## 2021-07-22 DIAGNOSIS — J9601 Acute respiratory failure with hypoxia: Secondary | ICD-10-CM | POA: Diagnosis not present

## 2021-07-29 ENCOUNTER — Ambulatory Visit: Payer: Medicare PPO | Admitting: Emergency Medicine

## 2021-07-29 ENCOUNTER — Other Ambulatory Visit: Payer: Self-pay

## 2021-07-29 ENCOUNTER — Telehealth: Payer: Self-pay | Admitting: Family Medicine

## 2021-07-29 ENCOUNTER — Encounter: Payer: Self-pay | Admitting: Emergency Medicine

## 2021-07-29 DIAGNOSIS — J9611 Chronic respiratory failure with hypoxia: Secondary | ICD-10-CM | POA: Diagnosis not present

## 2021-07-29 DIAGNOSIS — J479 Bronchiectasis, uncomplicated: Secondary | ICD-10-CM | POA: Diagnosis not present

## 2021-07-29 NOTE — Assessment & Plan Note (Signed)
She was prescribed O2 with exertion when she was admitted for CAP in 03/2021.  Rarely uses but this is principally because she is not very active.  When she is more active she does use her oxygen with exertion.  She has a POC but she does not like it, and is hoping to get an alternative.  We will ask [employer] for options and to requalify her if necessary. ?

## 2021-07-29 NOTE — Assessment & Plan Note (Addendum)
Cough is much improved, question because she is off the lisinopril.  Minimal sputum production.  Plan to continue to follow, treat her rhinitis aggressively.  Continue her Symbicort to address asthmatic component.  Mixed obstruction and restriction confirmed on her most recent PFT. ?

## 2021-07-29 NOTE — Addendum Note (Signed)
Addended by: Gavin Potters R on: 07/29/2021 04:03 PM ? ? Modules accepted: Orders ? ?

## 2021-07-29 NOTE — Patient Instructions (Addendum)
Please continue Symbicort 2 puffs twice a day.  Rinse and gargle after using. ?Keep albuterol available to use 2 puffs if you needed for shortness of breath, chest tightness, wheezing. ?Please continue your cetirizine, fluticasone nasal spray, Singulair as you have been taking them ?We reviewed your pulmonary function testing and your CT scan of the chest today. ?We will ask [employer] to work on qualifying you for a different portable oxygen concentrator. ?Follow with APP in 6 months ?Follow Dr. Lamonte Sakai in 12 months or sooner if you have any problems. ?

## 2021-07-29 NOTE — Telephone Encounter (Signed)
Noted  

## 2021-07-29 NOTE — Telephone Encounter (Signed)
[name], physical therapist, with [facility] states pt did not have therapy on 3/15 due to pt still recuperating from her recent surgery.  Just an FYI ?

## 2021-07-29 NOTE — Progress Notes (Signed)
? ?Subjective:  ? ? Patient ID: [name], female    DOB: Oct 08, 1940, 81 y.o.   MRN: [id number] ? ?HPI ? ?ROV 12/31/20 --follow-up visit for 81 year old woman with a history of bronchiectasis and associated tracheomalacia, probable asthma based on mixed disease on prior PFT.  She has chronic hoarseness and upper airway irritation, often loses her voice.  I tried stopping her Symbicort in June to see if this would help with upper airway irritation.  Also started PPI and changed her lisinopril to valsartan.  She reports today that she couldn't tolerate being off the lisinopril. Went back on it. Did tolerate being off the Symbicort. She is not using any BD currently. She has been on flonase before but not currently, remains on cetrizine. Still having voice hoarseness, some intermittent cough ? ? ?ROV 07/29/21 --81 year old woman with a history of suspected asthma, upper airway irritation syndrome and cough, bronchiectasis with significant tracheomalacia.  She has chronic hoarseness and often loses her voice.  She was hospitalized for community-acquired pneumonia and associated hypoxemia in November 2022. ?PMH: Hypertension, GERD, depression, obesity, osteoarthritis right knee, endometriosis, hyperlipidemia, GERD/gastritis. ?She is on cetirizine, fluticasone nasal spray, Singulair, Symbicort, uses albuterol almost never. She has not been using her O2 with any frequency. She does walk some, has to stop to rest. Minimal cough, can be productive. She does still have a raspy voice.  ? ?CT scan of the chest 06/10/2021 reviewed by me shows no mediastinal or axillary adenopathy, small left pleural effusion with some associated atelectasis.  Mild tubular bronchiectasis and some bandlike scarring in the bilateral bases with some stable scattered small groundglass pulmonary nodules (back to 01/26/2017) ? ?Pulmonary function testing 06/01/2021 reviewed by me shows mixed obstruction and restriction without a bronchodilator response,  FEV1 49% predicted, restricted lung volumes, decreased diffusion capacity that corrects to the normal range when adjusted for alveolar volume ? ? ?Review of Systems ?As per HPI ? ?   ?Objective:  ? Physical Exam ? ?Vitals:  ? 07/29/21 1435  ?BP: 136/84  ?Pulse: 75  ?Temp: 98.5 ?F (36.9 ?C)  ?TempSrc: Oral  ?SpO2: 96%  ?Height: 6' (1.829 m)  ? ?Gen: Pleasant, obese woman, in no distress,  normal affect ? ?ENT: No lesions,  mouth clear,  oropharynx clear, no postnasal drip, some hoarse voice ? ?Neck: No JVD, no stridor ? ?Lungs: No use of accessory muscles, few crackles, no wheezing on normal respiration, no wheeze on forced expiration ? ?Cardiovascular: RRR, heart sounds normal, no murmur or gallops, no peripheral edema ? ?Musculoskeletal: No deformities, no cyanosis or clubbing ? ?Neuro: alert, awake, non focal ? ?Skin: Warm, no lesions or rash ? ?   ?Assessment & Plan:  ? ?Chronic respiratory failure with hypoxia (HCC) ?She was prescribed O2 with exertion when she was admitted for CAP in 03/2021.  Rarely uses but this is principally because she is not very active.  When she is more active she does use her oxygen with exertion.  She has a POC but she does not like it, and is hoping to get an alternative.  We will ask [employer] for options and to requalify her if necessary. ? ?Bronchiectasis ([place]) ?Cough is much improved, question because she is off the lisinopril.  Minimal sputum production.  Plan to continue to follow, treat her rhinitis aggressively.  Continue her Symbicort to address asthmatic component.  Mixed obstruction and restriction confirmed on her most recent PFT. ? ? ?Baltazar Apo, MD, PhD ?07/29/2021, 2:55 PM ?[facility] ?[phone] or if no answer before 7:00PM call [phone] ?For any issues after 7:00PM please call eLink [phone] ? ?

## 2021-07-30 ENCOUNTER — Ambulatory Visit (HOSPITAL_BASED_OUTPATIENT_CLINIC_OR_DEPARTMENT_OTHER): Payer: Medicare PPO | Admitting: Obstetrics & Gynecology

## 2021-07-30 ENCOUNTER — Other Ambulatory Visit: Payer: Self-pay

## 2021-07-30 ENCOUNTER — Encounter (HOSPITAL_BASED_OUTPATIENT_CLINIC_OR_DEPARTMENT_OTHER): Payer: Self-pay | Admitting: Obstetrics & Gynecology

## 2021-07-30 VITALS — BP 145/63 | HR 80 | Ht 72.0 in | Wt 255.0 lb

## 2021-07-30 DIAGNOSIS — N952 Postmenopausal atrophic vaginitis: Secondary | ICD-10-CM

## 2021-07-30 DIAGNOSIS — J9611 Chronic respiratory failure with hypoxia: Secondary | ICD-10-CM

## 2021-07-30 NOTE — Progress Notes (Signed)
81 y.o. Married White female G0P0 here for Estring removal and replacement.  Reports she is having some vulvar irritation today.  She had a very loose stool yesterday while in a restaurant and really had trouble getting cleaned up.   ? ?Denies vaginal bleeding.   ? ?Past Medical History:  ?Diagnosis Date  ? Allergy   ? Anemia   ? Anxiety   ? Arthritis   ? right knee;injections every 21month   ? Asthma   ? use daily  ? Breast cancer ([facility] 1994/1995  ? HX BREAST CANCER/ right brreast and left breast in 1995  ? Bronchiectasis ([place]   ? COMPRESSION FRACTURE, LUMBAR VERTEBRAE 08/21/2008  ? Qualifier: Diagnosis of  By: JArnoldo MoraleMD, JBalinda Quails  ? Depression   ? Diabetes mellitus   ? takes Amaryl and Januvia daily  ? Diverticulitis   ? Dyspnea   ? with activity  ? Early cataracts, bilateral   ? Eczema   ? Endometriosis   ? Fatty liver   ? Gastritis   ? Glaucoma   ? Hammer toe   ? History of kidney stones   ? Hyperlipidemia associated with type 2 diabetes mellitus ([facility] 07/26/2007  ? Diet/exercise control.    ? Hypertension   ? takes Atenolol daily  ? IBS (irritable bowel syndrome)   ? Joint pain   ? Neuropathy   ? BILATERAL FEET AND MID CALF  ? Neuropathy due to medical condition ([facility]   ? bi lat legs/feet  ? Open wound of second toe of left foot in past  ? at pre-op appt, wound appears clear of infection 1 month post removal of toenail, no obvious exudate present nor any redness,  ? Osteomyelitis ([facility]   ? left 2nd toe  ? Osteopenia   ? Perirectal fistula   ? Posterior tibial tendon dysfunction   ? left foot  ? Restless leg syndrome   ? Scoliosis   ? Severe esophageal dysplasia   ? Shingles   ? 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 Thyroid disease   ? Vitamin D deficiency   ? takes Vit d daily  ? Weakness   ? uses a walker and wheelchair  ? ? ?Review of Systems  ?Constitutional: Negative.   ? ?Exam:   ?BP (!) 145/63 (BP Location: Right Arm, Patient Position: Sitting, Cuff Size: Large)   Pulse 80   Ht  6' (1.829 m)   Wt 255 lb (115.7 kg)   LMP 05/10/1992 Comment: partial  BMI 34.58 kg/m?  ? ?General appearance: alert and no distress ? ?Pelvic: External genitalia:  no lesions ?             Urethra: normal appearing urethra with no masses, tenderness or lesions ?             Bartholins and Skenes: Bartholin's, Urethra, Skene's normal    ?             Vagina: normal appearing vagina with normal color and discharge, no lesions ?             Anus:  normal sphincter tone, no lesions ? ?Estring was removed without difficulty and then replaced.  Pt tolerated procedure well.   ? ?Assessment/Plan: ?1. Vaginal atrophy ?- return 3-4 months for removal and replacement of Estring ? ?

## 2021-07-30 NOTE — Progress Notes (Signed)
Flutter valve given to husband  ?

## 2021-08-01 ENCOUNTER — Other Ambulatory Visit: Payer: Self-pay | Admitting: Family Medicine

## 2021-08-03 DIAGNOSIS — R8271 Bacteriuria: Secondary | ICD-10-CM | POA: Diagnosis not present

## 2021-08-03 DIAGNOSIS — N200 Calculus of kidney: Secondary | ICD-10-CM | POA: Diagnosis not present

## 2021-08-13 ENCOUNTER — Telehealth: Payer: Self-pay

## 2021-08-13 NOTE — Telephone Encounter (Signed)
Noted  

## 2021-08-13 NOTE — Telephone Encounter (Signed)
..  Home Health Verbal Orders  Agency:  [facility]   Caller: Gracee - PT   Contact and title   Requesting OT/ PT/ Skilled nursing/ Social Work/ Speech:   PT verbal recert   Reason for Request:  recert   Frequency:   1 time a week for 9 weeks   HH needs F2F w/in last 30 days

## 2021-08-17 DIAGNOSIS — N3020 Other chronic cystitis without hematuria: Secondary | ICD-10-CM | POA: Diagnosis not present

## 2021-08-17 DIAGNOSIS — N200 Calculus of kidney: Secondary | ICD-10-CM | POA: Diagnosis not present

## 2021-08-19 DIAGNOSIS — L239 Allergic contact dermatitis, unspecified cause: Secondary | ICD-10-CM | POA: Diagnosis not present

## 2021-08-20 DIAGNOSIS — H401112 Primary open-angle glaucoma, right eye, moderate stage: Secondary | ICD-10-CM | POA: Diagnosis not present

## 2021-08-20 DIAGNOSIS — E119 Type 2 diabetes mellitus without complications: Secondary | ICD-10-CM | POA: Diagnosis not present

## 2021-08-20 DIAGNOSIS — H04123 Dry eye syndrome of bilateral lacrimal glands: Secondary | ICD-10-CM | POA: Diagnosis not present

## 2021-08-20 DIAGNOSIS — H401123 Primary open-angle glaucoma, left eye, severe stage: Secondary | ICD-10-CM | POA: Diagnosis not present

## 2021-08-20 LAB — HM DIABETES EYE EXAM

## 2021-08-22 DIAGNOSIS — J9601 Acute respiratory failure with hypoxia: Secondary | ICD-10-CM | POA: Diagnosis not present

## 2021-08-22 DIAGNOSIS — J4520 Mild intermittent asthma, uncomplicated: Secondary | ICD-10-CM | POA: Diagnosis not present

## 2021-08-22 DIAGNOSIS — M179 Osteoarthritis of knee, unspecified: Secondary | ICD-10-CM | POA: Diagnosis not present

## 2021-08-22 DIAGNOSIS — S42309A Unspecified fracture of shaft of humerus, unspecified arm, initial encounter for closed fracture: Secondary | ICD-10-CM | POA: Diagnosis not present

## 2021-08-25 ENCOUNTER — Encounter: Payer: Self-pay | Admitting: Family Medicine

## 2021-08-25 DIAGNOSIS — I700 Atherosclerosis of aorta: Secondary | ICD-10-CM

## 2021-08-25 DIAGNOSIS — E1169 Type 2 diabetes mellitus with other specified complication: Secondary | ICD-10-CM

## 2021-08-26 NOTE — Telephone Encounter (Signed)
Okay to send referral to Cardiology and what diagnosis? ?

## 2021-09-01 DIAGNOSIS — N3020 Other chronic cystitis without hematuria: Secondary | ICD-10-CM | POA: Diagnosis not present

## 2021-09-01 DIAGNOSIS — N201 Calculus of ureter: Secondary | ICD-10-CM | POA: Diagnosis not present

## 2021-09-02 ENCOUNTER — Encounter: Payer: Self-pay | Admitting: Cardiovascular Disease

## 2021-09-02 ENCOUNTER — Other Ambulatory Visit: Payer: Self-pay | Admitting: Family

## 2021-09-02 ENCOUNTER — Ambulatory Visit: Payer: Medicare PPO | Admitting: Cardiovascular Disease

## 2021-09-02 VITALS — BP 132/84 | HR 75 | Ht 72.0 in | Wt 250.0 lb

## 2021-09-02 DIAGNOSIS — I5032 Chronic diastolic (congestive) heart failure: Secondary | ICD-10-CM | POA: Diagnosis not present

## 2021-09-02 DIAGNOSIS — R0609 Other forms of dyspnea: Secondary | ICD-10-CM

## 2021-09-02 DIAGNOSIS — E669 Obesity, unspecified: Secondary | ICD-10-CM

## 2021-09-02 DIAGNOSIS — I10 Essential (primary) hypertension: Secondary | ICD-10-CM

## 2021-09-02 DIAGNOSIS — I700 Atherosclerosis of aorta: Secondary | ICD-10-CM

## 2021-09-02 DIAGNOSIS — R0602 Shortness of breath: Secondary | ICD-10-CM

## 2021-09-02 DIAGNOSIS — E782 Mixed hyperlipidemia: Secondary | ICD-10-CM | POA: Diagnosis not present

## 2021-09-02 NOTE — Assessment & Plan Note (Signed)
Ms. [name] complains of increasing dyspnea on exertion since surgical procedure which she had November of last year.  She I suspect this is multifactorial from obesity, deconditioning, and reactive airways disease and diastolic dysfunction.  I am going to get a coronary calcium score to further rule out an ischemic etiology.  She did have a Myoview stress test performed 09/07/2016 which was low risk and nonischemic. ?

## 2021-09-02 NOTE — Assessment & Plan Note (Signed)
History of morbid obesity with a BMI of 34.  I am going to refer her to the [facility] for dietary education and facilitated weight loss. ?

## 2021-09-02 NOTE — Progress Notes (Signed)
? ? ? ?09/02/2021 ?[name]   ?03/24/1941  ?[id number] ? ?Primary Physician Hunter, Stephen O, MD ?Primary Cardiologist:  J  MD FACP, FACC, FAHA, FSCAI ? ?HPI:  [name] is a 80 y.o. morbidly overweight married Caucasian female mother of 1, adopted child, grandmother of 1 grandchild who is accompanied by her husband Jim today.  She was referred by Dr. Hunter for cardiovascular valuation because of dyspnea on exertion.  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  Her cardiac risk factors are notable for treated hypertension, diabetes and hyperlipidemia.  There is no family history of heart disease.  She is never had a heart attack or stroke.  She had multiple operations recently including correction of an anal fissure and lithotripsy for nephrolithiasis.  She says that after her last procedure in November she is noticed increasing dyspnea on exertion but denies chest pain.  She did have a negative Myoview stress test performed 09/07/2016 and a 2D echo performed 11/27/2020 revealing normal LV systolic function with grade 1 diastolic dysfunction. ? ? ?Current Meds  ?Medication Sig  ? ACCU-CHEK GUIDE test strip USE 1 TO TEST UP TO TWICE DAILY  ? acetaminophen (TYLENOL) 500 MG tablet Take 1,000 mg by mouth every 6 (six) hours as needed.  ? albuterol (PROVENTIL) (2.5 MG/3ML) 0.083% nebulizer solution Take 3 mLs (2.5 mg total) by nebulization every 6 (six) hours as needed for wheezing or shortness of breath.  ? albuterol (VENTOLIN HFA) 108 (90 Base) MCG/ACT inhaler Inhale 2 puffs into the lungs every 6 (six) hours as needed for wheezing or shortness of breath.  ? atenolol (TENORMIN) 25 MG tablet Take 25 mg by mouth daily.  ? Bacillus Coagulans-Inulin (ALIGN PREBIOTIC-PROBIOTIC PO) Take 1 capsule by mouth daily.   ? blood glucose meter kit and supplies KIT Dispense based on patient and insurance preference. Use up to four times daily as directed. Dx  E11.9  ? cetirizine (ZYRTEC) 10 MG tablet Take 10 mg by mouth every morning.   ? Cholecalciferol (VITAMIN D3) 125 MCG (5000 UT) TABS Take 5,000 Units by mouth daily.  ? Coenzyme Q10 (COQ10) 200 MG CAPS Take 200 mg by mouth daily.  ? desvenlafaxine (PRISTIQ) 50 MG 24 hr tablet Take 1 tablet (50 mg total) by mouth daily.  ? dicyclomine (BENTYL) 20 MG tablet TAKE 1 TABLET BY MOUTH IN THE MORNING ,  AT  NOON,  IN  THE  EVENING,  AND  AT  BEDTIME  ? dorzolamide-timolol (COSOPT) 22.3-6.8 MG/ML ophthalmic solution Place 1 drop into both eyes 2 (two) times daily.   ? ESTRING 2 MG vaginal ring INSERT 1 RING  INTO VAGINA EVERY 3 MONTHS  ? FIBER PO Take 2 tablets by mouth every evening.  ? fluticasone (FLONASE) 50 MCG/ACT nasal spray Place 1 spray into both nostrils daily.  ? gabapentin (NEURONTIN) 100 MG capsule TAKE 1 CAPSULE BY MOUTH THREE TIMES DAILY AS NEEDED FOR  NEUROPATHIC  PAIN. (Patient taking differently: Take 200 mg by mouth 2 (two) times daily.)  ? glimepiride (AMARYL) 4 MG tablet TAKE 2 TABLETS BY MOUTH ONCE DAILY WITH BREAKFAST  ? glucose blood (ACCU-CHEK GUIDE) test strip USE  ONCE DAILY TO CHECK GLUCOSE  ? HYDROcodone-acetaminophen (NORCO/VICODIN) 5-325 MG tablet Take 1 tablet by mouth every 6 (six) hours as needed.  ? insulin degludec (TRESIBA) 200 UNIT/ML FlexTouch Pen Inject 60-80 Units into the skin in the morning. (Patient taking differently: Inject   80 Units into the skin in the morning.)  ? Insulin Pen Needle (NOVOFINE PLUS PEN NEEDLE) 32G X 4 MM MISC 1 each by Does not apply route daily.  ? Lancets Misc. (ACCU-CHEK FASTCLIX LANCET) KIT Use to test blood sugars daily. Dx: E11.9  ? latanoprost (XALATAN) 0.005 % ophthalmic solution Place 1 drop into the right eye at bedtime.   ? lovastatin (MEVACOR) 10 MG tablet Take 1 tablet (10 mg total) by mouth daily.  ? montelukast (SINGULAIR) 10 MG tablet Take 1 tablet (10 mg total) by mouth at bedtime.  ? Multiple Vitamin (MULTIVITAMIN WITH MINERALS) TABS tablet  Take 1 tablet by mouth daily. 3 Fruit Capsules 3 Vegetable Capsules - Balance of Nature  ? pramipexole (MIRAPEX) 0.5 MG tablet TAKE 1 TABLET BY MOUTH AT BEDTIME  ? Respiratory Therapy Supplies (FLUTTER) DEVI Use as directed  ? ROCKLATAN 0.02-0.005 % SOLN Place 1 drop into the left eye at bedtime.   ? Selenium 200 MCG CAPS Take 200 mcg by mouth at bedtime.   ? Spacer/Aero-Holding Josiah Lobo DEVI Use spacer with inhaler therapy  ? SYMBICORT 80-4.5 MCG/ACT inhaler Inhale 2 puffs by mouth twice daily  ? tamsulosin (FLOMAX) 0.4 MG CAPS capsule Take 1 capsule (0.4 mg total) by mouth daily.  ? temazepam (RESTORIL) 15 MG capsule Take 1 capsule (15 mg total) by mouth at bedtime.  ? UNABLE TO FIND Take 2 tablets by mouth at bedtime. Fiber therapy  ? valsartan (DIOVAN) 80 MG tablet Take 1 tablet (80 mg total) by mouth daily.  ? vitamin B-12 (CYANOCOBALAMIN) 1000 MCG tablet Take 1,000 mcg by mouth daily.  ?  ? ?Allergies  ?Allergen Reactions  ? Nitrofurantoin Other (See Comments)  ?  Severe headache ?HEADACHE  ? Levaquin [Levofloxacin In D5w] Other (See Comments)  ?  Pain in tendons   ? Brimonidine Other (See Comments)  ?  Made eyes and surrounding areas RED/ was an eye drop  ? Cephalexin Diarrhea and Other (See Comments)  ?  Patient can't remember reaction (per chart at [facility] states diarrhea) ?(tolerates Augmentin fine)  ? Erythromycin Ethylsuccinate Hives and Diarrhea  ? Oxycodone Itching  ? ? ?Social History  ? ?Socioeconomic History  ? Marital status: Married  ?  Spouse name: Clair Gulling  ? Number of children: 1  ? Years of education: Not on file  ? Highest education level: Not on file  ?Occupational History  ? Occupation: retired  ?Tobacco Use  ? Smoking status: Never  ? Smokeless tobacco: Never  ?Vaping Use  ? Vaping Use: Never used  ?Substance and Sexual Activity  ? Alcohol use: No  ? Drug use: No  ? Sexual activity: Not Currently  ?  Partners: Male  ?  Birth control/protection: Post-menopausal, Surgical  ?  Comment: partial  hysterectomy  ?Other Topics Concern  ? Not on file  ?Social History Narrative  ? Married 39 years in 2015. 1 adopted son. 1 grandkid (3 yo grandson)  ?   ? Retired from Development worker, international aid at [employer]  ?   ? Hobbies: fashion, tv shopping, sewing, decorate  ? ?Social Determinants of Health  ? ?Financial Resource Strain: Not on file  ?Food Insecurity: Not on file  ?Transportation Needs: Not on file  ?Physical Activity: Not on file  ?Stress: Not on file  ?Social Connections: Not on file  ?Intimate Partner Violence: Not on file  ?  ? ?Review of Systems: ?General: negative for chills, fever, night sweats or weight changes.  ?Cardiovascular: negative for chest  pain, dyspnea on exertion, edema, orthopnea, palpitations, paroxysmal nocturnal dyspnea or shortness of breath ?Dermatological: negative for rash ?Respiratory: negative for cough or wheezing ?Urologic: negative for hematuria ?Abdominal: negative for nausea, vomiting, diarrhea, bright red blood per rectum, melena, or hematemesis ?Neurologic: negative for visual changes, syncope, or dizziness ?All other systems reviewed and are otherwise negative except as noted above. ? ? ? ?Blood pressure 132/84, pulse 75, height 6' (1.829 m), weight 250 lb (113.4 kg), last menstrual period 05/10/1992.  ?General appearance: alert and no distress ?Neck: no adenopathy, no carotid bruit, no JVD, supple, symmetrical, trachea midline, and thyroid not enlarged, symmetric, no tenderness/mass/nodules ?Lungs: clear to auscultation bilaterally ?Heart: regular rate and rhythm, S1, S2 normal, no murmur, click, rub or gallop ?Extremities: 1+ ankle edema bilaterally ?Pulses: 2+ and symmetric ?Skin: Skin color, texture, turgor normal. No rashes or lesions ?Neurologic: Grossly normal ? ?EKG sinus rhythm 75 without ST or T wave changes.  Personally reviewed this EKG. ? ?ASSESSMENT AND PLAN:  ? ?Essential hypertension ?History of essential hypertension blood pressure measured today 132/84.  She is on  valsartan. ? ?Hyperlipidemia ?History of hyperlipidemia on statin therapy with lipid profile performed 12/18/2020 revealing total cholesterol 125, LDL 61 HDL 43. ? ?Obesity (BMI 30-39.9) ?History of morbid obesity with a BMI of 34

## 2021-09-02 NOTE — Assessment & Plan Note (Signed)
History of essential hypertension blood pressure measured today 132/84.  She is on valsartan. ?

## 2021-09-02 NOTE — Patient Instructions (Signed)
Medication Instructions:  ?Your physician recommends that you continue on your current medications as directed. Please refer to the Current Medication list given to you today. ? ?*If you need a refill on your cardiac medications before your next appointment, please call your pharmacy* ? ? ? ?Testing/Procedures: ?Dr. Gwenlyn Found has ordered a CT coronary calcium score.  ? ?Test locations:  ?[facility] ([address] [place], [place] [address]) ?[facility] ([address] [place], [place] [address])  ? ?This is $99 out of pocket. ? ? ?Coronary CalciumScan ?A coronary calcium scan is an imaging test used to look for deposits of calcium and other fatty materials (plaques) in the inner lining of the blood vessels of the heart (coronary arteries). These deposits of calcium and plaques can partly clog and narrow the coronary arteries without producing any symptoms or warning signs. This puts a person at risk for a heart attack. This test can detect these deposits before symptoms develop. ?Tell a health care provider about: ?Any allergies you have. ?All medicines you are taking, including vitamins, herbs, eye drops, creams, and over-the-counter medicines. ?Any problems you or family members have had with anesthetic medicines. ?Any blood disorders you have. ?Any surgeries you have had. ?Any medical conditions you have. ?Whether you are pregnant or may be pregnant. ?What are the risks? ?Generally, this is a safe procedure. However, problems may occur, including: ?Harm to a pregnant woman and her unborn baby. This test involves the use of radiation. Radiation exposure can be dangerous to a pregnant woman and her unborn baby. If you are pregnant, you generally should not have this procedure done. ?Slight increase in the risk of cancer. This is because of the radiation involved in the test. ?What happens before the procedure? ?No preparation is needed for this procedure. ?What happens during the procedure? ?You  will undress and remove any jewelry around your neck or chest. ?You will put on a hospital gown. ?Sticky electrodes will be placed on your chest. The electrodes will be connected to an electrocardiogram (ECG) machine to record a tracing of the electrical activity of your heart. ?A CT scanner will take pictures of your heart. During this time, you will be asked to lie still and hold your breath for 2-3 seconds while a picture of your heart is being taken. ?The procedure may vary among health care providers and hospitals. ?What happens after the procedure? ?You can get dressed. ?You can return to your normal activities. ?It is up to you to get the results of your test. Ask your health care provider, or the department that is doing the test, when your results will be ready. ?Summary ?A coronary calcium scan is an imaging test used to look for deposits of calcium and other fatty materials (plaques) in the inner lining of the blood vessels of the heart (coronary arteries). ?Generally, this is a safe procedure. Tell your health care provider if you are pregnant or may be pregnant. ?No preparation is needed for this procedure. ?A CT scanner will take pictures of your heart. ?You can return to your normal activities after the scan is done. ?This information is not intended to replace advice given to you by your health care provider. Make sure you discuss any questions you have with your health care provider. ?Document Released: 10/23/2007 Document Revised: 03/15/2016 Document Reviewed: 03/15/2016 ?Elsevier Interactive Patient Education ? 2017 Elsevier Inc. ? ? ? ?Follow-Up: ?At [facility], you and your health needs are our priority.  As part of  our continuing mission to provide you with exceptional heart care, we have created designated Provider Care Teams.  These Care Teams include your primary Cardiologist (physician) and Advanced Practice Providers (APPs -  Physician Assistants and Nurse Practitioners) who all work  together to provide you with the care you need, when you need it. ? ?We recommend signing up for the patient portal called "MyChart".  Sign up information is provided on this After Visit Summary.  MyChart is used to connect with patients for Virtual Visits (Telemedicine).  Patients are able to view lab/test results, encounter notes, upcoming appointments, etc.  Non-urgent messages can be sent to your provider as well.   ?To learn more about what you can do with MyChart, go to [email or web address].   ? ?Your next appointment:   ?We will see you on an as needed basis. ? ? ?Provider:   ?Quay Burow, MD ?

## 2021-09-02 NOTE — Assessment & Plan Note (Signed)
2D echo performed 11/27/2020 showed normal LV systolic function with grade 1 diastolic dysfunction.  She does take as needed diuretics for swelling. ?

## 2021-09-02 NOTE — Assessment & Plan Note (Signed)
History of hyperlipidemia on statin therapy with lipid profile performed 12/18/2020 revealing total cholesterol 125, LDL 61 HDL 43. ?

## 2021-09-10 ENCOUNTER — Telehealth: Payer: Self-pay | Admitting: Family Medicine

## 2021-09-10 DIAGNOSIS — R0602 Shortness of breath: Secondary | ICD-10-CM

## 2021-09-10 MED ORDER — SYMBICORT 80-4.5 MCG/ACT IN AERO: 2.0000 | INHALATION_SPRAY | Freq: Two times a day (BID) | RESPIRATORY_TRACT | 3 refills | Status: DC

## 2021-09-10 NOTE — Telephone Encounter (Signed)
Rx sent to pharmacy   

## 2021-09-10 NOTE — Telephone Encounter (Signed)
.. ?  Encourage patient to contact the pharmacy for refills or they can request refills through [facility] ? ?LAST APPOINTMENT DATE:  ?07/16/21 ? ?NEXT APPOINTMENT DATE: ?10/09/21 ? ?MEDICATION: ?SYMBICORT 80-4.5 MCG/ACT inhaler [[id number]]  ? ? ?Is the patient out of medication?  ?Unknown ? ? ?PHARMACY: ?[place], [place] - [address]  ?[place] [place] [address]  ?Phone:  [phone]  Fax:  [phone] ? ? ?Let patient know to contact pharmacy at the end of the day to make sure medication is ready. ? ?Please notify patient to allow 48-72 hours to process  ?

## 2021-09-13 ENCOUNTER — Other Ambulatory Visit: Payer: Self-pay | Admitting: Family Medicine

## 2021-09-18 DIAGNOSIS — N201 Calculus of ureter: Secondary | ICD-10-CM | POA: Diagnosis not present

## 2021-09-18 DIAGNOSIS — R311 Benign essential microscopic hematuria: Secondary | ICD-10-CM | POA: Diagnosis not present

## 2021-09-19 ENCOUNTER — Other Ambulatory Visit: Payer: Self-pay | Admitting: Gastroenterology

## 2021-09-20 ENCOUNTER — Emergency Department (HOSPITAL_BASED_OUTPATIENT_CLINIC_OR_DEPARTMENT_OTHER)
Admission: EM | Admit: 2021-09-20 | Discharge: 2021-09-21 | Disposition: A | Discharge status: Home / Self Care | Payer: Medicare PPO | Attending: Emergency Medicine | Admitting: Emergency Medicine

## 2021-09-20 ENCOUNTER — Encounter (HOSPITAL_BASED_OUTPATIENT_CLINIC_OR_DEPARTMENT_OTHER): Payer: Self-pay

## 2021-09-20 ENCOUNTER — Other Ambulatory Visit: Payer: Self-pay

## 2021-09-20 ENCOUNTER — Emergency Department (HOSPITAL_BASED_OUTPATIENT_CLINIC_OR_DEPARTMENT_OTHER): Payer: Medicare PPO

## 2021-09-20 DIAGNOSIS — N200 Calculus of kidney: Secondary | ICD-10-CM | POA: Diagnosis not present

## 2021-09-20 DIAGNOSIS — R600 Localized edema: Secondary | ICD-10-CM | POA: Diagnosis not present

## 2021-09-20 DIAGNOSIS — Z79899 Other long term (current) drug therapy: Secondary | ICD-10-CM | POA: Diagnosis not present

## 2021-09-20 DIAGNOSIS — Z794 Long term (current) use of insulin: Secondary | ICD-10-CM | POA: Diagnosis not present

## 2021-09-20 DIAGNOSIS — N132 Hydronephrosis with renal and ureteral calculous obstruction: Secondary | ICD-10-CM | POA: Insufficient documentation

## 2021-09-20 DIAGNOSIS — Z7984 Long term (current) use of oral hypoglycemic drugs: Secondary | ICD-10-CM | POA: Insufficient documentation

## 2021-09-20 DIAGNOSIS — R1031 Right lower quadrant pain: Secondary | ICD-10-CM | POA: Diagnosis present

## 2021-09-20 DIAGNOSIS — K5730 Diverticulosis of large intestine without perforation or abscess without bleeding: Secondary | ICD-10-CM | POA: Diagnosis not present

## 2021-09-20 LAB — CBC WITH DIFFERENTIAL/PLATELET
Abs Immature Granulocytes: 0.05 10*3/uL (ref 0.00–0.07)
Basophils Absolute: 0.1 10*3/uL (ref 0.0–0.1)
Basophils Relative: 1 %
Eosinophils Absolute: 0.9 10*3/uL — ABNORMAL HIGH (ref 0.0–0.5)
Eosinophils Relative: 7 %
HCT: 39.1 % (ref 36.0–46.0)
Hemoglobin: 12.2 g/dL (ref 12.0–15.0)
Immature Granulocytes: 0 %
Lymphocytes Relative: 10 %
Lymphs Abs: 1.4 10*3/uL (ref 0.7–4.0)
MCH: 27.9 pg (ref 26.0–34.0)
MCHC: 31.2 g/dL (ref 30.0–36.0)
MCV: 89.5 fL (ref 80.0–100.0)
Monocytes Absolute: 0.9 10*3/uL (ref 0.1–1.0)
Monocytes Relative: 7 %
Neutro Abs: 10 10*3/uL — ABNORMAL HIGH (ref 1.7–7.7)
Neutrophils Relative %: 75 %
Platelets: 239 10*3/uL (ref 150–400)
RBC: 4.37 MIL/uL (ref 3.87–5.11)
RDW: 14.2 % (ref 11.5–15.5)
WBC: 13.3 10*3/uL — ABNORMAL HIGH (ref 4.0–10.5)
nRBC: 0 % (ref 0.0–0.2)

## 2021-09-20 LAB — COMPREHENSIVE METABOLIC PANEL
ALT: 9 U/L (ref 0–44)
AST: 13 U/L — ABNORMAL LOW (ref 15–41)
Albumin: 3.5 g/dL (ref 3.5–5.0)
Alkaline Phosphatase: 77 U/L (ref 38–126)
Anion gap: 10 (ref 5–15)
BUN: 13 mg/dL (ref 8–23)
CO2: 31 mmol/L (ref 22–32)
Calcium: 9.1 mg/dL (ref 8.9–10.3)
Chloride: 99 mmol/L (ref 98–111)
Creatinine, Ser: 0.8 mg/dL (ref 0.44–1.00)
GFR, Estimated: >60 mL/min (ref >60)
Glucose, Bld: 140 mg/dL — ABNORMAL HIGH (ref 70–99)
Potassium: 3.8 mmol/L (ref 3.5–5.1)
Sodium: 140 mmol/L (ref 135–145)
Total Bilirubin: 0.5 mg/dL (ref 0.3–1.2)
Total Protein: 6.7 g/dL (ref 6.5–8.1)

## 2021-09-20 NOTE — ED Triage Notes (Addendum)
Reports having a 59m kidney stone and received lithotripsy several weeks ago. Pain subsided but returned today in right flank and unable to urinate.  ?

## 2021-09-20 NOTE — ED Provider Notes (Signed)
?[facility] ?Provider Note ? ? ?CSN: [id number] ?Arrival date & time: 09/20/21  1909 ? ?  ? ?History ? ?Chief Complaint  ?Patient presents with  ? Flank Pain  ? ? ?[name] is a 81 y.o. female. ? ?HPI ?Patient had a large renal calculus.  She underwent lithotripsy.  She reports today she felt like the stone moved.  She had severe pain in her right flank and then right lower abdomen.  She then felt like was difficult to urinate and felt that she was retaining urine.  No fevers no chills.  No vomiting patient reports she is currently taking an antibiotic ?  ? ?Home Medications ?Prior to Admission medications   ?Medication Sig Start Date End Date Taking? Authorizing Provider  ?ACCU-CHEK GUIDE test strip USE 1 TO TEST UP TO TWICE DAILY 05/14/20   Marin Olp, MD  ?acetaminophen (TYLENOL) 500 MG tablet Take 1,000 mg by mouth every 6 (six) hours as needed.    [provider]  ?albuterol (PROVENTIL) (2.5 MG/3ML) 0.083% nebulizer solution Take 3 mLs (2.5 mg total) by nebulization every 6 (six) hours as needed for wheezing or shortness of breath. 04/21/21   Cobb, Karie Schwalbe, NP  ?albuterol (VENTOLIN HFA) 108 (90 Base) MCG/ACT inhaler Inhale 2 puffs into the lungs every 6 (six) hours as needed for wheezing or shortness of breath. 04/21/21   Cobb, Karie Schwalbe, NP  ?atenolol (TENORMIN) 25 MG tablet Take 25 mg by mouth daily. 06/22/21   [provider]  ?Bacillus Coagulans-Inulin (ALIGN PREBIOTIC-PROBIOTIC PO) Take 1 capsule by mouth daily.     [provider]  ?BD PEN NEEDLE NANO 2ND GEN 32G X 4 MM MISC USE AS DIRECTED DAILY 09/14/21   Marin Olp, MD  ?blood glucose meter kit and supplies KIT Dispense based on patient and insurance preference. Use up to four times daily as directed. Dx E11.9 08/21/19   Marin Olp, MD  ?cetirizine (ZYRTEC) 10 MG tablet Take 10 mg by mouth every morning.     [provider]  ?Cholecalciferol (VITAMIN D3) 125  MCG (5000 UT) TABS Take 5,000 Units by mouth daily.    [provider]  ?Coenzyme Q10 (COQ10) 200 MG CAPS Take 200 mg by mouth daily.    [provider]  ?colesevelam (WELCHOL) 625 MG tablet Take 1 tablet (625 mg total) by mouth 3 (three) times daily. Need office visit. ?Patient not taking: Reported on 09/02/2021 05/05/21   Mauri Pole, MD  ?desvenlafaxine (PRISTIQ) 50 MG 24 hr tablet Take 1 tablet (50 mg total) by mouth daily. 03/04/21   Marin Olp, MD  ?dicyclomine (BENTYL) 20 MG tablet TAKE 1 TABLET BY MOUTH IN THE MORNING ,  AT  NOON,  IN  THE  EVENING,  AND  AT  BEDTIME 06/09/21   Nandigam, Venia Minks, MD  ?dorzolamide-timolol (COSOPT) 22.3-6.8 MG/ML ophthalmic solution Place 1 drop into both eyes 2 (two) times daily.  02/08/13   [provider]  ?ESTRING 2 MG vaginal ring INSERT 1 RING  INTO VAGINA EVERY 3 MONTHS 07/29/20   Megan Salon, MD  ?FIBER PO Take 2 tablets by mouth every evening.    [provider]  ?fluticasone (FLONASE) 50 MCG/ACT nasal spray Place 1 spray into both nostrils daily. 04/21/21   Cobb, Karie Schwalbe, NP  ?furosemide (LASIX) 20 MG tablet Take 1 tablet (20 mg total) by mouth daily as needed for fluid or edema. ?Patient not taking:  Reported on 09/02/2021 07/16/21   Marin Olp, MD  ?gabapentin (NEURONTIN) 100 MG capsule TAKE 1 CAPSULE BY MOUTH THREE TIMES DAILY AS NEEDED FOR  NEUROPATHIC  PAIN. ?Patient taking differently: Take 200 mg by mouth 2 (two) times daily. 12/04/20   Marin Olp, MD  ?glimepiride (AMARYL) 4 MG tablet TAKE 2 TABLETS BY MOUTH ONCE DAILY WITH BREAKFAST 07/20/21   Marin Olp, MD  ?glucose blood (ACCU-CHEK GUIDE) test strip USE 1 ONCE DAILY TO CHECK GLUCOSE 09/14/21   Marin Olp, MD  ?HYDROcodone-acetaminophen (NORCO/VICODIN) 5-325 MG tablet Take 1 tablet by mouth every 6 (six) hours as needed. 07/20/21   Ardis Hughs, MD  ?insulin degludec (TRESIBA) 200 UNIT/ML FlexTouch Pen Inject 60-80 Units into  the skin in the morning. ?Patient taking differently: Inject 80 Units into the skin in the morning. 01/22/21   Marin Olp, MD  ?Lancets Misc. (ACCU-CHEK FASTCLIX LANCET) KIT Use to test blood sugars daily. Dx: E11.9 08/23/19   Marin Olp, MD  ?latanoprost (XALATAN) 0.005 % ophthalmic solution Place 1 drop into the right eye at bedtime.  05/20/18   [provider]  ?lovastatin (MEVACOR) 10 MG tablet Take 1 tablet (10 mg total) by mouth daily. 08/11/20   Marin Olp, MD  ?montelukast (SINGULAIR) 10 MG tablet Take 1 tablet (10 mg total) by mouth at bedtime. 04/21/21   Clayton Bibles, NP  ?Multiple Vitamin (MULTIVITAMIN WITH MINERALS) TABS tablet Take 1 tablet by mouth daily. 3 Fruit Capsules 3 Vegetable Capsules - Balance of Nature 03/26/21   Little Ishikawa, MD  ?pramipexole (MIRAPEX) 0.5 MG tablet TAKE 1 TABLET BY MOUTH AT BEDTIME 07/20/21   Marin Olp, MD  ?Respiratory Therapy Supplies (FLUTTER) DEVI Use as directed 01/08/16   Juanito Doom, MD  ?ROCKLATAN 0.02-0.005 % SOLN Place 1 drop into the left eye at bedtime.  01/02/20   [provider]  ?Selenium 200 MCG CAPS Take 200 mcg by mouth at bedtime.     [provider]  ?Spacer/Aero-Holding Chambers DEVI Use spacer with inhaler therapy 04/29/21   Clayton Bibles, NP  ?[facility] 80-4.5 MCG/ACT inhaler Inhale 2 puffs into the lungs 2 (two) times daily. 09/10/21   Marin Olp, MD  ?tamsulosin (FLOMAX) 0.4 MG CAPS capsule Take 1 capsule (0.4 mg total) by mouth daily. 07/20/21   Ardis Hughs, MD  ?temazepam (RESTORIL) 15 MG capsule Take 1 capsule (15 mg total) by mouth at bedtime. 08/03/21   Marin Olp, MD  ?UNABLE TO FIND Take 2 tablets by mouth at bedtime. Fiber therapy    [provider]  ?valsartan (DIOVAN) 80 MG tablet Take 1 tablet (80 mg total) by mouth daily. 06/24/21   Marin Olp, MD  ?vitamin B-12 (CYANOCOBALAMIN) 1000 MCG tablet Take 1,000 mcg by mouth daily.     [provider]  ?   ? ?Allergies    ?Nitrofurantoin, Levaquin [levofloxacin in d5w], Brimonidine, Cephalexin, Erythromycin ethylsuccinate, and Oxycodone   ? ?Review of Systems   ?Review of Systems ?10 systems reviewed negative except as per HPI ?Physical Exam ?Updated Vital Signs ?BP (!) 180/91   Pulse 87   Temp 98.3 ?F (36.8 ?C) (Oral)   Resp 18   Ht 6' (1.829 m)   Wt 113.4 kg   LMP 05/10/1992 Comment: partial  SpO2 92%   BMI 33.91 kg/m?  ?Physical Exam ?Constitutional:   ?   Appearance: Normal appearance.  ?HENT:  ?  Mouth/Throat:  ?   Pharynx: Oropharynx is clear.  ?Eyes:  ?   Extraocular Movements: Extraocular movements intact.  ?Cardiovascular:  ?   Rate and Rhythm: Normal rate and regular rhythm.  ?Pulmonary:  ?   Effort: Pulmonary effort is normal.  ?   Breath sounds: Normal breath sounds.  ?Abdominal:  ?   Comments: Abdomen soft.  Mild right lower quadrant pain to palpation.  No guarding.  ?Musculoskeletal:  ?   Comments: 2+ edema bilateral ankles and feet.  ?Skin: ?   General: Skin is warm and dry.  ?Neurological:  ?   General: No focal deficit present.  ?   Mental Status: She is alert and oriented to person, place, and time.  ?   Coordination: Coordination normal.  ?Psychiatric:     ?   Mood and Affect: Mood normal.  ? ? ?ED Results / Procedures / Treatments   ?Labs ?(all labs ordered are listed, but only abnormal results are displayed) ?Labs Reviewed  ?CBC WITH DIFFERENTIAL/PLATELET - Abnormal; Notable for the following components:  ?    Result Value  ? WBC 13.3 (*)   ? Neutro Abs 10.0 (*)   ? Eosinophils Absolute 0.9 (*)   ? All other components within normal limits  ?COMPREHENSIVE METABOLIC PANEL - Abnormal; Notable for the following components:  ? Glucose, Bld 140 (*)   ? AST 13 (*)   ? All other components within normal limits  ?URINE CULTURE  ?URINALYSIS, ROUTINE W REFLEX MICROSCOPIC  ? ? ?EKG ?None ? ?Radiology ?CT Renal Stone Study ? ?Result Date: 09/20/2021 ?CLINICAL DATA:   History of right renal stone with recent lithotripsy, initial encounter EXAM: CT ABDOMEN AND PELVIS WITHOUT CONTRAST TECHNIQUE: Multidetector CT imaging of the abdomen and pelvis was performed following the standard prot

## 2021-09-21 DIAGNOSIS — S42309A Unspecified fracture of shaft of humerus, unspecified arm, initial encounter for closed fracture: Secondary | ICD-10-CM | POA: Diagnosis not present

## 2021-09-21 DIAGNOSIS — J9601 Acute respiratory failure with hypoxia: Secondary | ICD-10-CM | POA: Diagnosis not present

## 2021-09-21 DIAGNOSIS — M179 Osteoarthritis of knee, unspecified: Secondary | ICD-10-CM | POA: Diagnosis not present

## 2021-09-21 DIAGNOSIS — J4520 Mild intermittent asthma, uncomplicated: Secondary | ICD-10-CM | POA: Diagnosis not present

## 2021-09-21 LAB — URINALYSIS, ROUTINE W REFLEX MICROSCOPIC
Bilirubin Urine: NEGATIVE
Glucose, UA: NEGATIVE mg/dL
Hgb urine dipstick: NEGATIVE
Ketones, ur: NEGATIVE mg/dL
Nitrite: NEGATIVE
Protein, ur: 30 mg/dL — AB
Specific Gravity, Urine: 1.019 (ref 1.005–1.030)
pH: 7 (ref 5.0–8.0)

## 2021-09-21 MED ORDER — CEPHALEXIN 250 MG PO CAPS: 1000.0000 mg | ORAL_CAPSULE | Freq: Once | ORAL | Status: DC

## 2021-09-21 MED ORDER — HYDROCODONE-ACETAMINOPHEN 5-325 MG PO TABS: 1.0000 | ORAL_TABLET | Freq: Once | ORAL | Status: DC

## 2021-09-21 MED ORDER — HYDROMORPHONE HCL 1 MG/ML IJ SOLN
0.5000 mg | Freq: Once | INTRAMUSCULAR | Status: AC
  Administered 2021-09-21: 0.5 mg via INTRAVENOUS
  Filled 2021-09-21: qty 1

## 2021-09-21 MED ORDER — ONDANSETRON HCL 4 MG/2ML IJ SOLN
4.0000 mg | Freq: Once | INTRAMUSCULAR | Status: AC
  Administered 2021-09-21: 4 mg via INTRAVENOUS
  Filled 2021-09-21: qty 2

## 2021-09-21 NOTE — Discharge Instructions (Addendum)
1.  Call [facility] in the morning to schedule your follow-up as soon as possible. ?2.  Take another pain medication when you get home. ?

## 2021-09-22 LAB — URINE CULTURE: Culture: NO GROWTH

## 2021-09-24 ENCOUNTER — Other Ambulatory Visit: Payer: Self-pay | Admitting: Urology

## 2021-09-25 ENCOUNTER — Other Ambulatory Visit: Payer: Self-pay | Admitting: Family Medicine

## 2021-09-26 ENCOUNTER — Other Ambulatory Visit: Payer: Self-pay | Admitting: Family Medicine

## 2021-10-08 ENCOUNTER — Ambulatory Visit
Admission: RE | Admit: 2021-10-08 | Discharge: 2021-10-08 | Disposition: A | Discharge status: Home / Self Care | Payer: Self-pay | Source: Ambulatory Visit | Attending: Cardiovascular Disease | Admitting: Cardiovascular Disease

## 2021-10-08 DIAGNOSIS — I700 Atherosclerosis of aorta: Secondary | ICD-10-CM

## 2021-10-08 DIAGNOSIS — I10 Essential (primary) hypertension: Secondary | ICD-10-CM

## 2021-10-09 ENCOUNTER — Ambulatory Visit: Payer: Medicare PPO | Admitting: Family Medicine

## 2021-10-09 ENCOUNTER — Encounter: Payer: Self-pay | Admitting: Family Medicine

## 2021-10-09 VITALS — BP 124/64 | HR 88 | Temp 98.1°F | Ht 72.0 in | Wt 250.0 lb

## 2021-10-09 DIAGNOSIS — E782 Mixed hyperlipidemia: Secondary | ICD-10-CM | POA: Diagnosis not present

## 2021-10-09 DIAGNOSIS — E1165 Type 2 diabetes mellitus with hyperglycemia: Secondary | ICD-10-CM

## 2021-10-09 DIAGNOSIS — I10 Essential (primary) hypertension: Secondary | ICD-10-CM

## 2021-10-09 NOTE — Progress Notes (Signed)
Phone [phone] In person visit   Subjective:   [name] is a 81 y.o. year old very pleasant female patient who presents for/with See problem oriented charting Chief Complaint  Patient presents with   Follow-up    Pt still has kidney stone.  Want to review CT scan results from Dr. Gwenlyn Found that was done yesterday.   Hypertension   Diabetes    Discuss a1c and weight loss meds (dosent want to start this until kidney stone taken care of).    Past Medical History-  Patient Active Problem List   Diagnosis Date Noted   Chronic UTI 02/23/2021    Priority: High   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 11/22/2019    Priority: High   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    Priority: High   Bronchiectasis ([place]) 10/18/2015    Priority: High   Recurrent major depression in full remission ([place]) 01/03/2014    Priority: High   Poorly controlled diabetes mellitus ([place]) 12/28/2006    Priority: High   Adenomatous polyp 12/20/2018    Priority: Medium    Personal history of colonic polyps 05/18/2017    Priority: Medium    Aortic atherosclerosis ([place]) 02/16/2016    Priority: Medium    Solitary pulmonary nodule 01/08/2016    Priority: Medium    IBS (irritable bowel syndrome) 07/17/2014    Priority: Medium    Essential hypertension 01/03/2014    Priority: Medium    Primary open-angle glaucoma 08/02/2012    Priority: Medium    Fatty liver 03/18/2009    Priority: Medium    Hyperlipidemia 07/26/2007    Priority: Medium    ANEMIA, B12 DEFICIENCY 12/29/2006    Priority: Medium    RESTLESS LEG SYNDROME, MILD 12/28/2006    Priority: Medium    Osteoporosis 12/12/2006    Priority: Medium    History of total knee replacement, bilateral 11/14/2017    Priority: Low   Arthritis of midfoot 03/03/2016    Priority: Low   Hammertoes of both feet 03/03/2016    Priority: Low   Posterior tibial tendon dysfunction 01/03/2014    Priority: Low   Osteoarthritis, knee 01/03/2014     Priority: Low   Glaucoma 01/03/2014    Priority: Low   Obesity (BMI 30-39.9) 06/15/2013    Priority: Low   Foot tendinitis 07/20/2010    Priority: Low   INSOMNIA, CHRONIC 07/25/2009    Priority: Low   GERD 12/25/2007    Priority: Low   Idiopathic peripheral neuropathy 12/28/2006    Priority: Low   BREAST CANCER, HX OF 12/12/2006    Priority: Low   Chronic diastolic heart failure ([place]) 09/02/2021   Right lower quadrant abdominal pain 06/16/2021   Chronic respiratory failure with hypoxia ([place]) 04/29/2021   Fatigue 04/29/2021   Hospital discharge follow-up 04/21/2021   Perineal sinus 03/25/2021   Heart palpitations 02/11/2021   Ankle edema, bilateral 02/11/2021   Hoarseness 10/17/2020   Physical deconditioning 02/22/2019   Dyspnea on exertion 02/22/2019   Pain due to onychomycosis of toenails of both feet 11/08/2018    Medications- reviewed and updated Current Outpatient Medications  Medication Sig Dispense Refill   ACCU-CHEK GUIDE test strip USE 1 TO TEST UP TO TWICE DAILY 200 each 0   albuterol (PROVENTIL) (2.5 MG/3ML) 0.083% nebulizer solution Take 3 mLs (2.5 mg total) by nebulization every 6 (six) hours as needed for wheezing or shortness of breath. 75 mL 12   albuterol (VENTOLIN HFA) 108 (90 Base) MCG/ACT inhaler  Inhale 2 puffs into the lungs every 6 (six) hours as needed for wheezing or shortness of breath. 8 g 2   atenolol (TENORMIN) 25 MG tablet Take 25 mg by mouth daily.     Bacillus Coagulans-Inulin (ALIGN PREBIOTIC-PROBIOTIC PO) Take 1 capsule by mouth daily.      BD PEN NEEDLE NANO 2ND GEN 32G X 4 MM MISC USE AS DIRECTED DAILY 100 each 0   cetirizine (ZYRTEC) 10 MG tablet Take 10 mg by mouth every morning.      Cholecalciferol (VITAMIN D3) 125 MCG (5000 UT) TABS Take 5,000 Units by mouth daily.     Coenzyme Q10 (COQ10) 200 MG CAPS Take 200 mg by mouth daily.     desvenlafaxine (PRISTIQ) 50 MG 24 hr tablet Take 1 tablet (50 mg total) by mouth daily. 90 tablet 3    dicyclomine (BENTYL) 20 MG tablet TAKE 1 TABLET BY MOUTH 4 TIMES DAILY IN  THE  MORNING,  AT  NOON,  IN  THE  EVENING,  AND  AT  BEDTIME 120 tablet 0   dorzolamide-timolol (COSOPT) 22.3-6.8 MG/ML ophthalmic solution Place 1 drop into both eyes 2 (two) times daily.      ESTRING 2 MG vaginal ring INSERT 1 RING  INTO VAGINA EVERY 3 MONTHS 1 each 4   FIBER PO Take 2 tablets by mouth every evening.     fluticasone (FLONASE) 50 MCG/ACT nasal spray Place 1 spray into both nostrils daily. 18.2 mL 2   furosemide (LASIX) 20 MG tablet Take 1 tablet (20 mg total) by mouth daily as needed for fluid or edema. 30 tablet 0   gabapentin (NEURONTIN) 100 MG capsule TAKE 1 CAPSULE BY MOUTH THREE TIMES DAILY AS NEEDED FOR  NEUROPATHIC  PAIN. (Patient taking differently: Take 200 mg by mouth 2 (two) times daily.) 270 capsule 3   glimepiride (AMARYL) 4 MG tablet TAKE 2 TABLETS BY MOUTH ONCE DAILY WITH BREAKFAST 180 tablet 0   glucose blood (ACCU-CHEK GUIDE) test strip USE 1 STRIP ONCE DAILY TO  CHECK  GLUCOSE 100 each 0   HYDROcodone-acetaminophen (NORCO/VICODIN) 5-325 MG tablet Take 1 tablet by mouth every 6 (six) hours as needed. 20 tablet 0   insulin degludec (TRESIBA) 200 UNIT/ML FlexTouch Pen Inject 60-80 Units into the skin in the morning. (Patient taking differently: Inject 80 Units into the skin in the morning.) 4 mL 11   Lancets Misc. (ACCU-CHEK FASTCLIX LANCET) KIT Use to test blood sugars daily. Dx: E11.9 1 kit 5   latanoprost (XALATAN) 0.005 % ophthalmic solution Place 1 drop into the right eye at bedtime.      lovastatin (MEVACOR) 10 MG tablet Take 1 tablet by mouth once daily 90 tablet 0   montelukast (SINGULAIR) 10 MG tablet Take 1 tablet (10 mg total) by mouth at bedtime. 30 tablet 11   Multiple Vitamin (MULTIVITAMIN WITH MINERALS) TABS tablet Take 1 tablet by mouth daily. 3 Fruit Capsules 3 Vegetable Capsules - Balance of Nature 30 tablet 0   pramipexole (MIRAPEX) 0.5 MG tablet TAKE 1 TABLET BY MOUTH AT  BEDTIME 90 tablet 0   ROCKLATAN 0.02-0.005 % SOLN Place 1 drop into the left eye at bedtime.      Selenium 200 MCG CAPS Take 200 mcg by mouth at bedtime.      SYMBICORT 80-4.5 MCG/ACT inhaler Inhale 2 puffs into the lungs 2 (two) times daily. 11 g 3   tamsulosin (FLOMAX) 0.4 MG CAPS capsule Take 1 capsule (0.4 mg total)  by mouth daily. 7 capsule 0   temazepam (RESTORIL) 15 MG capsule Take 1 capsule (15 mg total) by mouth at bedtime. 30 capsule 5   valsartan (DIOVAN) 80 MG tablet Take 1 tablet (80 mg total) by mouth daily. 90 tablet 3   vitamin B-12 (CYANOCOBALAMIN) 1000 MCG tablet Take 1,000 mcg by mouth daily.     No current facility-administered medications for this visit.     Objective:  BP 124/64   Pulse 88   Temp 98.1 F (36.7 C)   Ht 6' (1.829 m)   Wt 250 lb (113.4 kg)   LMP 05/10/1992 Comment: partial  SpO2 94%   BMI 33.91 kg/m  Gen: NAD, resting comfortably CV: RRR no murmurs rubs or gallops Lungs: CTAB no crackles, wheeze, rhonchi Ext: no edema Skin: warm, dry Neuro: in wheelchair Diabetic Foot Exam - Simple   Simple Foot Form Visual Inspection See comments: Yes Sensation Testing See comments: Yes Pulse Check Posterior Tibialis and Dorsalis pulse intact bilaterally: Yes Comments No sensation to monofilament with no neuropathy.  Callous at tip of second toe on the left foot- states working with podiatry       Assessment and Plan   #upcoming lithotripsy on June 8th- still having intermittent pain but better than last visit  # Diabetes S: Medication:Tresiba 80 units, glimepiride 8 mg -History of UTIs holding off on Jardiance.  We have considered starting Ozempic CBGs- low 100s in AMs- as low as 90s. No lows under 70 Exercise and diet- not exercising - set back with kidney stone issues Lab Results  Component Value Date   HGBA1C 7.8 (H) 07/02/2021   HGBA1C 8.4 (H) 03/09/2021   HGBA1C 8.5 (H) 12/18/2020  A/P: due for repeat a1c- she wants to wait and do  this after her lithotripsy- for now continue current meds. Consider ozempic- but wants to get stone removed   #hypertension S: medication: Valsartan 80 mg daily, atenolol 25 mg -Prior cough/pulmonary issues on lisinopril BP Readings from Last 3 Encounters:  10/09/21 124/64  09/21/21 (!) 165/68  09/02/21 132/84  A/P: Controlled. Continue current medications.    #hyperlipidemia with aortic atherosclerosis S: Medication:lovastatin 10 mg daily also takes coq10 -also on welchol for GI issues in past- reports she is off Lab Results  Component Value Date   CHOL 125 12/18/2020   HDL 43.60 12/18/2020   LDLCALC 61 12/18/2020   LDLDIRECT 77.0 02/16/2016   TRIG 102.0 12/18/2020   CHOLHDL 3 12/18/2020   A/P: Excellent control even with coronary artery calcium as well as aortic atherosclerosis with LDL under 70-continue current medication   Recommended follow up: Return for next already scheduled visit or sooner if needed. Future Appointments  Date Time Provider [place]  11/30/2021  1:45 PM Megan Salon, MD [facility]  12/31/2021  1:00 PM Marin Olp, MD [facility]  01/29/2022  2:00 PM Parrett, Fonnie Mu, NP [facility] None    Lab/Order associations: No diagnosis found.  No orders of the defined types were placed in this encounter.   Return precautions advised.  [name], MD

## 2021-10-09 NOTE — Patient Instructions (Addendum)
Glad you are doing better but hope you feel even better than this after your procedure  Recommended follow up: Return for next already scheduled visit or sooner if needed.

## 2021-10-13 NOTE — Progress Notes (Signed)
Talked with patient. Instructions given. Hx and meds reviewed. Driver secured. No B/p or IV;s in (L) arm.

## 2021-10-15 ENCOUNTER — Ambulatory Visit (HOSPITAL_BASED_OUTPATIENT_CLINIC_OR_DEPARTMENT_OTHER)
Admission: RE | Admit: 2021-10-15 | Discharge: 2021-10-15 | Disposition: A | Discharge status: Home / Self Care | Payer: Medicare PPO | Attending: Urology | Admitting: Urology

## 2021-10-15 ENCOUNTER — Encounter (HOSPITAL_BASED_OUTPATIENT_CLINIC_OR_DEPARTMENT_OTHER)
Admission: RE | Disposition: A | Discharge status: Home / Self Care | Payer: Self-pay | Source: Home / Self Care | Attending: Urology

## 2021-10-15 ENCOUNTER — Encounter (HOSPITAL_BASED_OUTPATIENT_CLINIC_OR_DEPARTMENT_OTHER): Payer: Self-pay | Admitting: Urology

## 2021-10-15 ENCOUNTER — Other Ambulatory Visit: Payer: Self-pay

## 2021-10-15 ENCOUNTER — Ambulatory Visit (HOSPITAL_COMMUNITY): Payer: Medicare PPO

## 2021-10-15 DIAGNOSIS — Z9049 Acquired absence of other specified parts of digestive tract: Secondary | ICD-10-CM | POA: Diagnosis not present

## 2021-10-15 DIAGNOSIS — I878 Other specified disorders of veins: Secondary | ICD-10-CM | POA: Diagnosis not present

## 2021-10-15 DIAGNOSIS — N201 Calculus of ureter: Secondary | ICD-10-CM | POA: Insufficient documentation

## 2021-10-15 HISTORY — PX: EXTRACORPOREAL SHOCK WAVE LITHOTRIPSY: SHX1557

## 2021-10-15 LAB — GLUCOSE, CAPILLARY: Glucose-Capillary: 128 mg/dL — ABNORMAL HIGH (ref 70–99)

## 2021-10-15 SURGERY — LITHOTRIPSY, ESWL
Anesthesia: LOCAL | Laterality: Right

## 2021-10-15 MED ORDER — SODIUM CHLORIDE 0.9 % IV SOLN: INTRAVENOUS | Status: DC

## 2021-10-15 MED ORDER — DOXYCYCLINE MONOHYDRATE 100 MG PO TABS: 100.0000 mg | ORAL_TABLET | Freq: Two times a day (BID) | ORAL | 0 refills | Status: AC

## 2021-10-15 MED ORDER — DIAZEPAM 5 MG PO TABS: 10.0000 mg | ORAL_TABLET | ORAL | Status: DC

## 2021-10-15 MED ORDER — CIPROFLOXACIN HCL 500 MG PO TABS
500.0000 mg | ORAL_TABLET | ORAL | Status: AC
  Administered 2021-10-15: 500 mg via ORAL

## 2021-10-15 MED ORDER — DIAZEPAM 5 MG PO TABS
ORAL_TABLET | ORAL | Status: AC
  Filled 2021-10-15: qty 1

## 2021-10-15 MED ORDER — CIPROFLOXACIN HCL 500 MG PO TABS
ORAL_TABLET | ORAL | Status: AC
  Filled 2021-10-15: qty 1

## 2021-10-15 MED ORDER — HYDROCODONE-ACETAMINOPHEN 5-325 MG PO TABS: 1.0000 | ORAL_TABLET | ORAL | 0 refills | Status: DC | PRN

## 2021-10-15 MED ORDER — DIAZEPAM 5 MG PO TABS
5.0000 mg | ORAL_TABLET | ORAL | Status: AC
  Administered 2021-10-15: 5 mg via ORAL

## 2021-10-15 MED ORDER — DIPHENHYDRAMINE HCL 25 MG PO CAPS: 25.0000 mg | ORAL_CAPSULE | ORAL | Status: DC

## 2021-10-15 NOTE — H&P (Signed)
Please see HP scanned into Epic

## 2021-10-15 NOTE — Op Note (Signed)
See [facility] OP note scanned into chart. Also because of the size, density, location and other factors that cannot be anticipated I feel this will likely be a staged procedure. This fact supersedes any indication in the scanned [facility] operative note to the contrary.  

## 2021-10-15 NOTE — Discharge Instructions (Signed)
See [facility] discharge instructions in chart.  

## 2021-10-16 ENCOUNTER — Other Ambulatory Visit: Payer: Self-pay | Admitting: Family Medicine

## 2021-10-17 ENCOUNTER — Other Ambulatory Visit: Payer: Self-pay | Admitting: Family Medicine

## 2021-10-19 ENCOUNTER — Encounter (HOSPITAL_BASED_OUTPATIENT_CLINIC_OR_DEPARTMENT_OTHER): Payer: Self-pay | Admitting: Urology

## 2021-10-22 DIAGNOSIS — M179 Osteoarthritis of knee, unspecified: Secondary | ICD-10-CM | POA: Diagnosis not present

## 2021-10-22 DIAGNOSIS — J9601 Acute respiratory failure with hypoxia: Secondary | ICD-10-CM | POA: Diagnosis not present

## 2021-10-22 DIAGNOSIS — S42309A Unspecified fracture of shaft of humerus, unspecified arm, initial encounter for closed fracture: Secondary | ICD-10-CM | POA: Diagnosis not present

## 2021-10-22 DIAGNOSIS — J4520 Mild intermittent asthma, uncomplicated: Secondary | ICD-10-CM | POA: Diagnosis not present

## 2021-10-26 DIAGNOSIS — L97522 Non-pressure chronic ulcer of other part of left foot with fat layer exposed: Secondary | ICD-10-CM | POA: Diagnosis not present

## 2021-10-30 DIAGNOSIS — N3020 Other chronic cystitis without hematuria: Secondary | ICD-10-CM | POA: Diagnosis not present

## 2021-11-05 ENCOUNTER — Telehealth: Payer: Self-pay | Admitting: Emergency Medicine

## 2021-11-05 DIAGNOSIS — H10022 Other mucopurulent conjunctivitis, left eye: Secondary | ICD-10-CM | POA: Diagnosis not present

## 2021-11-05 DIAGNOSIS — J9611 Chronic respiratory failure with hypoxia: Secondary | ICD-10-CM

## 2021-11-05 NOTE — Telephone Encounter (Signed)
Ok to discontinue 

## 2021-11-05 NOTE — Telephone Encounter (Signed)
Called patient back this evening and she states that she would like an order placed her her to discontinue her oxygen. She states she has not used her oxygen for about 3 month now. She wants [employer] to come get it so she is not being charged for something she is not using.   Please advise sir

## 2021-11-05 NOTE — Telephone Encounter (Signed)
Called and spoke with patient's husband Jeneen Rinks to let him know that Dr. Lamonte Sakai is ok with placing order for [name] to D/C oxygen. He expressed understanding. DME order has been placed. Nothing further needed at this time.

## 2021-11-11 DIAGNOSIS — L97522 Non-pressure chronic ulcer of other part of left foot with fat layer exposed: Secondary | ICD-10-CM | POA: Diagnosis not present

## 2021-11-12 DIAGNOSIS — N202 Calculus of kidney with calculus of ureter: Secondary | ICD-10-CM | POA: Diagnosis not present

## 2021-11-12 DIAGNOSIS — R8271 Bacteriuria: Secondary | ICD-10-CM | POA: Diagnosis not present

## 2021-11-12 DIAGNOSIS — N132 Hydronephrosis with renal and ureteral calculous obstruction: Secondary | ICD-10-CM | POA: Diagnosis not present

## 2021-11-12 DIAGNOSIS — N3941 Urge incontinence: Secondary | ICD-10-CM | POA: Diagnosis not present

## 2021-11-20 ENCOUNTER — Other Ambulatory Visit (HOSPITAL_BASED_OUTPATIENT_CLINIC_OR_DEPARTMENT_OTHER): Payer: Self-pay

## 2021-11-20 ENCOUNTER — Other Ambulatory Visit (HOSPITAL_BASED_OUTPATIENT_CLINIC_OR_DEPARTMENT_OTHER): Payer: Self-pay | Admitting: *Deleted

## 2021-11-20 DIAGNOSIS — N952 Postmenopausal atrophic vaginitis: Secondary | ICD-10-CM

## 2021-11-20 MED ORDER — ESTRING 2 MG VA RING: 2.0000 mg | VAGINAL_RING | VAGINAL | 4 refills | Status: DC

## 2021-11-21 DIAGNOSIS — S42309A Unspecified fracture of shaft of humerus, unspecified arm, initial encounter for closed fracture: Secondary | ICD-10-CM | POA: Diagnosis not present

## 2021-11-21 DIAGNOSIS — J4520 Mild intermittent asthma, uncomplicated: Secondary | ICD-10-CM | POA: Diagnosis not present

## 2021-11-21 DIAGNOSIS — J9601 Acute respiratory failure with hypoxia: Secondary | ICD-10-CM | POA: Diagnosis not present

## 2021-11-21 DIAGNOSIS — M179 Osteoarthritis of knee, unspecified: Secondary | ICD-10-CM | POA: Diagnosis not present

## 2021-11-30 ENCOUNTER — Ambulatory Visit (HOSPITAL_BASED_OUTPATIENT_CLINIC_OR_DEPARTMENT_OTHER): Payer: Medicare PPO | Admitting: Obstetrics & Gynecology

## 2021-12-12 ENCOUNTER — Other Ambulatory Visit: Payer: Self-pay | Admitting: Family Medicine

## 2021-12-17 DIAGNOSIS — R079 Chest pain, unspecified: Secondary | ICD-10-CM | POA: Diagnosis not present

## 2021-12-19 ENCOUNTER — Other Ambulatory Visit: Payer: Self-pay | Admitting: Family Medicine

## 2021-12-22 DIAGNOSIS — J9601 Acute respiratory failure with hypoxia: Secondary | ICD-10-CM | POA: Diagnosis not present

## 2021-12-22 DIAGNOSIS — M179 Osteoarthritis of knee, unspecified: Secondary | ICD-10-CM | POA: Diagnosis not present

## 2021-12-22 DIAGNOSIS — J4520 Mild intermittent asthma, uncomplicated: Secondary | ICD-10-CM | POA: Diagnosis not present

## 2021-12-22 DIAGNOSIS — S42309A Unspecified fracture of shaft of humerus, unspecified arm, initial encounter for closed fracture: Secondary | ICD-10-CM | POA: Diagnosis not present

## 2021-12-24 ENCOUNTER — Encounter: Payer: Self-pay | Admitting: *Deleted

## 2021-12-24 ENCOUNTER — Other Ambulatory Visit: Payer: Self-pay | Admitting: *Deleted

## 2021-12-24 NOTE — Patient Instructions (Signed)
Visit Information  Thank you for taking time to visit with me today. Please don't hesitate to contact me if I can be of assistance to you.   Following are the goals we discussed today:   Goals Addressed               This Visit's Progress     Mychart issues (pt-stated)        Care Coordination Interventions: Reviewed medications with patient and discussed purpose of all medications Reviewed scheduled/upcoming provider appointments including pending appointments. Also encouraged pt to contact her provider office for an AWV appointment. Screening for signs and symptoms of depression related to chronic disease state  Assessed social determinant of health barriers Pt having issues with access mychart however report her computer is not currently working and pt receptive to today's assessment to be mailed to the address on file.         Please call the care guide team at [phone] if you need to cancel or reschedule your appointment.   If you are experiencing a Mental Health or [place] or need someone to talk to, please call the Suicide and Crisis Lifeline: 988  The patient verbalized understanding of instructions, educational materials, and care plan provided today and agreed to receive a mailed copy of patient instructions, educational materials, and care plan.   No further follow up required: No additional needs to address at this time.  Raina Mina, RN Care Management Coordinator [place] Office [phone]

## 2021-12-24 NOTE — Patient Outreach (Signed)
  Care Coordination   Initial Visit Note   12/24/2021 Name: RENESMEE RAINE MRN: [id number] DOB: 20-Aug-1940  CORNELL GABER is a 81 y.o. year old female who sees Yong Channel, Brayton Mars, MD for primary care. I spoke with  [name] by phone today  What matters to the patients health and wellness today?  MyChart issues   Goals Addressed               This Visit's Progress     Mychart issues (pt-stated)        Care Coordination Interventions: Reviewed medications with patient and discussed purpose of all medications Reviewed scheduled/upcoming provider appointments including pending appointments. Also encouraged pt to contact her provider office for an AWV appointment. Screening for signs and symptoms of depression related to chronic disease state  Assessed social determinant of health barriers Pt having issues with access mychart however report her computer is not currently working and pt receptive to today's assessment to be mailed to the address on file.         SDOH assessments and interventions completed:  Yes  SDOH Interventions Today    Flowsheet Row Most Recent Value  SDOH Interventions   Food Insecurity Interventions Intervention Not Indicated  Housing Interventions Intervention Not Indicated  Transportation Interventions Intervention Not Indicated        Care Coordination Interventions Activated:  Yes  Care Coordination Interventions:  Yes, provided   Follow up plan: No further intervention required.   Encounter Outcome:  Pt. Visit Completed   Raina Mina, RN Care Management Coordinator [place] Office [phone]

## 2021-12-31 ENCOUNTER — Other Ambulatory Visit: Payer: Self-pay | Admitting: Family Medicine

## 2021-12-31 ENCOUNTER — Ambulatory Visit (INDEPENDENT_AMBULATORY_CARE_PROVIDER_SITE_OTHER): Payer: Medicare PPO | Admitting: Family Medicine

## 2021-12-31 ENCOUNTER — Encounter: Payer: Self-pay | Admitting: Family Medicine

## 2021-12-31 VITALS — BP 138/64 | HR 73 | Temp 98.7°F | Ht 72.0 in

## 2021-12-31 DIAGNOSIS — I10 Essential (primary) hypertension: Secondary | ICD-10-CM | POA: Diagnosis not present

## 2021-12-31 DIAGNOSIS — E1165 Type 2 diabetes mellitus with hyperglycemia: Secondary | ICD-10-CM

## 2021-12-31 DIAGNOSIS — D518 Other vitamin B12 deficiency anemias: Secondary | ICD-10-CM

## 2021-12-31 DIAGNOSIS — Z0000 Encounter for general adult medical examination without abnormal findings: Secondary | ICD-10-CM

## 2021-12-31 DIAGNOSIS — E782 Mixed hyperlipidemia: Secondary | ICD-10-CM

## 2021-12-31 LAB — CBC WITH DIFFERENTIAL/PLATELET
Basophils Absolute: 0.1 10*3/uL (ref 0.0–0.1)
Basophils Relative: 0.8 % (ref 0.0–3.0)
Eosinophils Absolute: 0.5 10*3/uL (ref 0.0–0.7)
Eosinophils Relative: 5.9 % — ABNORMAL HIGH (ref 0.0–5.0)
HCT: 39.3 % (ref 36.0–46.0)
Hemoglobin: 12.8 g/dL (ref 12.0–15.0)
Lymphocytes Relative: 13.2 % (ref 12.0–46.0)
Lymphs Abs: 1.1 10*3/uL (ref 0.7–4.0)
MCHC: 32.4 g/dL (ref 30.0–36.0)
MCV: 90.9 fl (ref 78.0–100.0)
Monocytes Absolute: 0.7 10*3/uL (ref 0.1–1.0)
Monocytes Relative: 7.6 % (ref 3.0–12.0)
Neutro Abs: 6.2 10*3/uL (ref 1.4–7.7)
Neutrophils Relative %: 72.5 % (ref 43.0–77.0)
Platelets: 231 10*3/uL (ref 150.0–400.0)
RBC: 4.33 Mil/uL (ref 3.87–5.11)
RDW: 16.2 % — ABNORMAL HIGH (ref 11.5–15.5)
WBC: 8.5 10*3/uL (ref 4.0–10.5)

## 2021-12-31 LAB — HEMOGLOBIN A1C: Hgb A1c MFr Bld: 9.1 % — ABNORMAL HIGH (ref 4.6–6.5)

## 2021-12-31 LAB — COMPREHENSIVE METABOLIC PANEL
ALT: 15 U/L (ref 0–35)
AST: 19 U/L (ref 0–37)
Albumin: 3.6 g/dL (ref 3.5–5.2)
Alkaline Phosphatase: 85 U/L (ref 39–117)
BUN: 11 mg/dL (ref 6–23)
CO2: 31 mEq/L (ref 19–32)
Calcium: 9 mg/dL (ref 8.4–10.5)
Chloride: 101 mEq/L (ref 96–112)
Creatinine, Ser: 0.55 mg/dL (ref 0.40–1.20)
GFR: 85.63 mL/min (ref >60.00)
Glucose, Bld: 163 mg/dL — ABNORMAL HIGH (ref 70–99)
Potassium: 3.8 mEq/L (ref 3.5–5.1)
Sodium: 141 mEq/L (ref 135–145)
Total Bilirubin: 0.5 mg/dL (ref 0.2–1.2)
Total Protein: 6.6 g/dL (ref 6.0–8.3)

## 2021-12-31 LAB — LIPID PANEL
Cholesterol: 111 mg/dL (ref 0–200)
HDL: 43.6 mg/dL (ref >39.00)
LDL Cholesterol: 50 mg/dL (ref 0–99)
NonHDL: 67.48
Total CHOL/HDL Ratio: 3
Triglycerides: 88 mg/dL (ref 0.0–149.0)
VLDL: 17.6 mg/dL (ref 0.0–40.0)

## 2021-12-31 LAB — TSH: TSH: 3.59 u[IU]/mL (ref 0.35–5.50)

## 2021-12-31 LAB — VITAMIN B12: Vitamin B-12: 701 pg/mL (ref 211–911)

## 2021-12-31 MED ORDER — OZEMPIC (0.25 OR 0.5 MG/DOSE) 2 MG/1.5ML ~~LOC~~ SOPN: PEN_INJECTOR | SUBCUTANEOUS | 0 refills | Status: DC

## 2021-12-31 NOTE — Patient Instructions (Addendum)
-   consider RSV shot at pharmacy  -update dental visit  -No changes today  -call eye doctor TODAY- eye is red and watering but without drainage there should not be blurry vision with pink eye- I want you to be evaluated by an eye doctor to make sure no deeper issues in the eye  - get hearing test at [facility]- try debrox over the counter a week before going  Please stop by lab before you go If you have mychart- we will send your results within 3 business days of [name] receiving them.  If you do not have mychart- we will call you about results within 5 business days of [name] receiving them.  *please also note that you will see labs on mychart as soon as they post. I will later go in and write notes on them- will say "notes from Dr. Yong Channel"   Recommended follow up: Return in about 4 months (around 05/02/2022) for followup or sooner if needed.Schedule b4 you leave.

## 2021-12-31 NOTE — Progress Notes (Signed)
Phone [phone]   Subjective:  Patient presents today for their annual physical. Chief complaint-noted.   See problem oriented charting- ROS- full  review of systems was completed and negative except for: watering left eye, slightly red, trouble controlling appetite and weight gain, slight memory issues. No shortness of breath despite low normal oxygen. Restless leg issues reported  The following were reviewed and entered/updated in epic: Past Medical History:  Diagnosis Date   Allergy    Anemia    Anxiety    Arthritis    right knee;injections every 10months    Asthma    use daily   Breast cancer ([place]) 1994/1995   HX BREAST CANCER/ right brreast and left breast in 1995   Bronchiectasis ([employer])    COMPRESSION FRACTURE, LUMBAR VERTEBRAE 08/21/2008   Qualifier: Diagnosis of  By: Arnoldo Morale MD, Balinda Quails    Depression    Diabetes mellitus    takes Amaryl and Januvia daily   Diverticulitis    Dyspnea    with activity   Early cataracts, bilateral    Eczema    Endometriosis    Fatty liver    Gastritis    Glaucoma    Hammer toe    History of kidney stones    Hyperlipidemia associated with type 2 diabetes mellitus ([place]) 07/26/2007   Diet/exercise control.     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   Thyroid disease    Vitamin D deficiency    takes Vit d daily   Weakness    uses  a walker and wheelchair   Patient Active Problem List   Diagnosis Date Noted   Chronic UTI 02/23/2021    Priority: High   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 11/22/2019    Priority: High   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    Priority: High   Bronchiectasis ([place]) 10/18/2015    Priority: High   Recurrent major depression in full remission ([place]) 01/03/2014    Priority: High   Poorly controlled diabetes mellitus ([place]) 12/28/2006    Priority: High   Adenomatous polyp 12/20/2018    Priority: Medium    Personal history of colonic polyps 05/18/2017    Priority: Medium    Aortic atherosclerosis ([place]) 02/16/2016    Priority: Medium    Solitary pulmonary nodule 01/08/2016    Priority: Medium    IBS (irritable bowel syndrome) 07/17/2014    Priority: Medium    Essential hypertension 01/03/2014    Priority: Medium    Primary open-angle glaucoma 08/02/2012    Priority: Medium    Fatty liver 03/18/2009    Priority: Medium    Hyperlipidemia 07/26/2007    Priority: Medium    ANEMIA, B12 DEFICIENCY 12/29/2006  Priority: Medium    RESTLESS LEG SYNDROME, MILD 12/28/2006    Priority: Medium    Osteoporosis 12/12/2006    Priority: Medium    History of total knee replacement, bilateral 11/14/2017    Priority: Low   Arthritis of midfoot 03/03/2016    Priority: Low   Hammertoes of both feet 03/03/2016    Priority: Low   Posterior tibial tendon dysfunction 01/03/2014    Priority: Low   Osteoarthritis, knee 01/03/2014    Priority: Low   Glaucoma 01/03/2014    Priority: Low   Obesity (BMI 30-39.9) 06/15/2013    Priority: Low   Foot tendinitis 07/20/2010    Priority: Low   INSOMNIA, CHRONIC 07/25/2009    Priority: Low   GERD 12/25/2007    Priority: Low   Idiopathic peripheral neuropathy 12/28/2006    Priority: Low   BREAST CANCER, HX OF 12/12/2006    Priority: Low   Chronic diastolic heart failure ([place]) 09/02/2021   Right lower quadrant abdominal pain 06/16/2021    Chronic respiratory failure with hypoxia ([place]) 04/29/2021   Fatigue 04/29/2021   Hospital discharge follow-up 04/21/2021   Perineal sinus 03/25/2021   Heart palpitations 02/11/2021   Ankle edema, bilateral 02/11/2021   Hoarseness 10/17/2020   Physical deconditioning 02/22/2019   Dyspnea on exertion 02/22/2019   Pain due to onychomycosis of toenails of both feet 11/08/2018   Past Surgical History:  Procedure Laterality Date   ADENOIDECTOMY     at age 67   ANAL FISTULOTOMY N/A 03/07/2020   Procedure: ANAL FISTULOTOMY WITH MARSUPIALIZATION;  Surgeon: Michael Boston, MD;  Location: [facility];  Service: General;  Laterality: N/A;   BIOPSY  12/19/2018   Procedure: BIOPSY;  Surgeon: Mauri Pole, MD;  [facility];  Service: Endoscopy;;   CATARACT EXTRACTION     CHOLECYSTECTOMY  06/2008   COLONOSCOPY WITH PROPOFOL N/A 05/17/2017   Procedure: COLONOSCOPY WITH PROPOFOL;  Surgeon: Mauri Pole, MD;  Location: [facility];  Service: Endoscopy;  Laterality: N/A;  PT WILL BE ADMITTED THE DAY BEFORE FOR PREP PER ROBIN KB   COLONOSCOPY WITH PROPOFOL N/A 12/19/2018   Procedure: COLONOSCOPY WITH PROPOFOL;  Surgeon: Mauri Pole, MD;  [facility];  Service: Endoscopy;  Laterality: N/A;   DILATION AND CURETTAGE OF UTERUS     excision on breast     internal infected suture from breast surgery   excision removed from neck  2008   infected lymph node   EXTRACORPOREAL SHOCK WAVE LITHOTRIPSY Right 07/20/2021   Procedure: EXTRACORPOREAL SHOCK WAVE LITHOTRIPSY (ESWL);  Surgeon: Ardis Hughs, MD;  Location[facility];  Service: Urology;  Laterality: Right;   EXTRACORPOREAL SHOCK WAVE LITHOTRIPSY Right 10/15/2021   Procedure: EXTRACORPOREAL SHOCK WAVE LITHOTRIPSY (ESWL);  Surgeon: Robley Fries, MD;  Location: [facility];  Service: Urology;  Laterality: Right;   HEMORRHOID SURGERY N/A 11/22/2019   Procedure:  HEMORRHOIDECTOMY LIGATION , PEXY;  Surgeon: Michael Boston, MD;  Location: [facility];  Service: General;  Laterality: N/A;   HYPERBARIC OXYGEN THERAPY     FEET INFECTION   HYSTEROSCOPY  06/22/2011   PMB submucosal myoma   JOINT REPLACEMENT  2014   left total shoulder   LAPAROSCOPIC OOPHORECTOMY Right 12/2004   absent LSO   LAPAROTOMY  1977   LIGATION OF INTERNAL FISTULA TRACT N/A 11/22/2019   Procedure: REPAIR OF PERIRECTAL FISTULA, ANORECTAL EXAMINATION UNDER ANESTHESIA;  Surgeon: Michael Boston, MD;  Location: [facility];  Service: General;  Laterality: N/A;   MASTECTOMY Bilateral 1994 right, 1995 left   PLACEMENT OF SETON N/A 03/19/2021   Procedure: LIMBERG RHOMBOID ROTATIONAL FLAP CLOSURE;  Surgeon: Michael Boston, MD;  [facility];  Service: General;  Laterality: N/A;   POLYPECTOMY  12/19/2018   Procedure: POLYPECTOMY;  Surgeon: Mauri Pole, MD;  [facility];  Service: Endoscopy;;   RECTAL EXAM UNDER ANESTHESIA N/A 03/07/2020   Procedure: ANORECTAL EXAM UNDER ANESTHESIA;  Surgeon: Michael Boston, MD;  Location: [facility];  Service: General;  Laterality: N/A;   RECTAL EXAM UNDER ANESTHESIA N/A 03/19/2021   Procedure: RECTAL EXAM UNDER ANESTHESIA;  Surgeon: Michael Boston, MD;  Location: [facility];  Service: General;  Laterality: N/A;   SHOULDER HEMI-ARTHROPLASTY  01/01/2012   Procedure: SHOULDER HEMI-ARTHROPLASTY;  Surgeon: Roseanne Kaufman, MD;  Location: [facility];  Service: Orthopedics;  Laterality: Left;  Left Shoulder Hemi Arthroplasty with Repair and Reconstruction as Necessary    THYROIDECTOMY  1975   19yrs ago. follows endocrine   TONSILLECTOMY     as a child   TOTAL KNEE ARTHROPLASTY Right 12/30/2014   Procedure: RIGHT TOTAL KNEE ARTHROPLASTY;  Surgeon: Gaynelle Arabian, MD;  Location[facility];  Service: Orthopedics;  Laterality: Right;   TOTAL KNEE ARTHROPLASTY Left 11/29/2016   Procedure: LEFT TOTAL KNEE  ARTHROPLASTY;  Surgeon: Gaynelle Arabian, MD;  [facility];  Service: Orthopedics;  Laterality: Left;    Family History  Problem Relation Age of Onset   Arthritis Mother    COPD Father    Heart disease Father        MI 58 - states he died of lockjaw   Hypertension Father    Hyperlipidemia Father    Pancreatic cancer Paternal Grandfather    Colon cancer Neg Hx    Esophageal cancer Neg Hx    Rectal cancer Neg Hx    Stomach cancer Neg Hx     Medications- reviewed and updated Current Outpatient Medications  Medication Sig Dispense Refill   ACCU-CHEK GUIDE test strip USE 1 TO TEST UP TO TWICE DAILY 200 each 0   atenolol (TENORMIN) 25 MG tablet Take 1 tablet by mouth once daily 90 tablet 0   Bacillus Coagulans-Inulin (ALIGN PREBIOTIC-PROBIOTIC PO) Take 1 capsule by mouth daily.      BD PEN NEEDLE NANO 2ND GEN 32G X 4 MM MISC USE AS DIRECTED DAILY 100 each 0   cetirizine (ZYRTEC) 10 MG tablet Take 10 mg by mouth every morning.      Cholecalciferol (VITAMIN D3) 125 MCG (5000 UT) TABS Take 5,000 Units by mouth daily.     Coenzyme Q10 (COQ10) 200 MG CAPS Take 200 mg by mouth daily.     desvenlafaxine (PRISTIQ) 50 MG 24 hr tablet Take 1 tablet (50 mg total) by mouth daily. 90 tablet 3   dicyclomine (BENTYL) 20 MG tablet TAKE 1 TABLET BY MOUTH 4 TIMES DAILY IN  THE  MORNING,  AT  NOON,  IN  THE  EVENING,  AND  AT  BEDTIME 120 tablet 0   dorzolamide-timolol (COSOPT) 22.3-6.8 MG/ML ophthalmic solution Place 1 drop into both eyes 2 (two) times daily.      ESTRING 2 MG RING Place 2 mg vaginally every 3 (three) months. INSERT 1 RING  INTO VAGINA EVERY 3 MONTHS 1 each 4   FIBER PO Take 2 tablets by mouth every evening.     fluticasone (FLONASE) 50 MCG/ACT nasal spray Place  1 spray into both nostrils daily. 18.2 mL 2   furosemide (LASIX) 20 MG tablet Take 1 tablet (20 mg total) by mouth daily as needed for fluid or edema. 30 tablet 0   gabapentin (NEURONTIN) 100 MG capsule TAKE 1 CAPSULE BY MOUTH  THREE TIMES DAILY AS NEEDED FOR  NEUROPATHIC  PAIN 270 capsule 0   glimepiride (AMARYL) 4 MG tablet TAKE 2 TABLETS BY MOUTH ONCE DAILY WITH BREAKFAST 180 tablet 0   glucose blood (ACCU-CHEK GUIDE) test strip USE 1 STRIP ONCE DAILY TO  CHECK  GLUCOSE 100 each 0   insulin degludec (TRESIBA) 200 UNIT/ML FlexTouch Pen Inject 60-80 Units into the skin in the morning. (Patient taking differently: Inject 80 Units into the skin in the morning.) 4 mL 11   Lancets Misc. (ACCU-CHEK FASTCLIX LANCET) KIT Use to test blood sugars daily. Dx: E11.9 1 kit 5   latanoprost (XALATAN) 0.005 % ophthalmic solution Place 1 drop into the right eye at bedtime.      lovastatin (MEVACOR) 10 MG tablet Take 1 tablet by mouth once daily 90 tablet 0   montelukast (SINGULAIR) 10 MG tablet Take 1 tablet (10 mg total) by mouth at bedtime. 30 tablet 11   Multiple Vitamin (MULTIVITAMIN WITH MINERALS) TABS tablet Take 1 tablet by mouth daily. 3 Fruit Capsules 3 Vegetable Capsules - Balance of Nature 30 tablet 0   pramipexole (MIRAPEX) 0.5 MG tablet TAKE 1 TABLET BY MOUTH AT BEDTIME 90 tablet 0   ROCKLATAN 0.02-0.005 % SOLN Place 1 drop into the left eye at bedtime.      Selenium 200 MCG CAPS Take 200 mcg by mouth at bedtime.      SYMBICORT 80-4.5 MCG/ACT inhaler Inhale 2 puffs into the lungs 2 (two) times daily. 11 g 3   temazepam (RESTORIL) 15 MG capsule Take 1 capsule (15 mg total) by mouth at bedtime. 30 capsule 5   valsartan (DIOVAN) 80 MG tablet Take 1 tablet (80 mg total) by mouth daily. 90 tablet 3   vitamin B-12 (CYANOCOBALAMIN) 1000 MCG tablet Take 1,000 mcg by mouth daily.     MYRBETRIQ 50 MG TB24 tablet Take 50 mg by mouth daily. Through urology     No current facility-administered medications for this visit.    Allergies-reviewed and updated Allergies  Allergen Reactions   Nitrofurantoin Other (See Comments)    Severe headache HEADACHE   Levaquin [Levofloxacin In D5w] Other (See Comments)    Pain in tendons     Brimonidine Other (See Comments)    Made eyes and surrounding areas RED/ was an eye drop   Cephalexin Diarrhea and Other (See Comments)    Patient can't remember reaction (per chart at [facility] states diarrhea) (tolerates Augmentin fine)   Erythromycin Ethylsuccinate Hives and Diarrhea   Oxycodone Itching    Social History   Social History Narrative   Married 39 years in 2015. 1 adopted son. 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 Oropharynx normal Neck:  no thyromegaly CV: RRR no murmurs rubs or gallops Lungs: CTAB no crackles, wheeze, rhonchi Abdomen: soft/nontender/nondistended/normal bowel sounds. No rebound or guarding.  Ext: no edema Skin: warm, dry Neuro: in wheelchair, PERRLA   Assessment and Plan   81 y.o. female presenting for annual physical.  Health Maintenance counseling: 1. Anticipatory guidance: Patient counseled regarding regular dental exams - advised q6 months, eye exams-regularly due to glaucoma history ,  avoiding smoking and second hand smoke, limiting alcohol to 1 beverage per day , no illicit drugs.   2. Risk factor reduction:  Advised patient of need for regular exercise and diet rich and fruits and vegetables to reduce risk of heart attack and stroke.  Exercise- encouraged walking. Has cubi like device not using Diet/weight management-refused to weigh today- not being careful with food choices- wants to reverse this - making some poor choices and encouraged her to pick healthier choices Wt Readings from Last 3 Encounters:  10/15/21  253 lb (114.8 kg)  10/09/21 250 lb (113.4 kg)  09/20/21 250 lb (113.4 kg)  3. Immunizations/screenings/ancillary studies- flu shot later in season .  Update COVID-19 vaccination in the fall when an updated version available - consider prevnar 20 and rsv at later date  Immunization History  Administered Date(s) Administered   Fluad Quad(high Dose 65+) 02/22/2019, 02/20/2020, 02/18/2021   H1N1 06/05/2008   Influenza Split 02/07/2012   Influenza Whole 03/10/2000, 01/24/2008, 03/10/2009, 01/20/2010   Influenza, High Dose Seasonal PF 02/12/2013, 01/31/2017, 01/19/2018   Influenza,inj,Quad PF,6+ Mos 01/17/2014, 03/27/2015   Influenza,inj,quad, With Preservative 02/07/2017   Influenza-Unspecified 02/09/2016   PFIZER(Purple Top)SARS-COV-2 Vaccination 06/03/2019, 06/25/2019, 04/24/2020   Pneumococcal Conjugate-13 01/17/2014   Pneumococcal Polysaccharide-23 03/10/2000, 05/02/2007, 01/02/2012   Td 05/10/1992, 12/25/2007   Zoster Recombinat (Shingrix) 02/24/2018, 04/26/2018  4. Cervical cancer screening- -follows with GYN but technically past age based screening recommendations 5. Breast cancer screening-  double mastectomy 1995- no chest wall abnormalities reported- still sees gyn 6. Colon cancer screening - 12/19/2018 with no recall due to age 37. Skin cancer screening- sees der. Lomax. advised regular sunscreen use. Denies worrisome, changing, or new skin lesions.  8. Birth control/STD check- postmenopausal/monogamous 9. Osteoporosis screening at 22- osteoporosis history- offered updating dexa 10. Smoking associated screening - never smoker  Status of chronic or acute concerns   #lithotripsy June 8th with urology- reports no more pain thankfully  #Left eye irritation - asap visit to eye doctor as has blurry vision in that eye and history of retinal issues  # Diabetes S: Medication: Tresiba 80 units, glimepiride 8 mg.  No Jardiance due to history of UTIs.  Have considered Ozempic Lab Results   Component Value Date   HGBA1C 7.8 (H) 07/02/2021   HGBA1C 8.4 (H) 03/09/2021   HGBA1C 8.5 (H) 12/18/2020   A/P: Poor control last check-hopefully A1c improved-if not under 7 consider Ozempic and stop glimepiride.  For now continue current medication -she is interested in Man  #hypertension S: medication: Valsartan 80 mg daily, atenolol 25 mg, Lasix 20 mg as needed for edema BP Readings from Last 3 Encounters:  12/31/21 138/64  10/15/21 (!) 141/55  10/09/21 124/64  A/P: Controlled. Continue current medications. Myrbetriq for OAB- not helping much yet   #hyperlipidemia #Aortic atherosclerosis S: Medication:lovastatin 10 daily along with co-Q10.  WelChol in the past for GI issues Lab Results  Component Value Date   CHOL 125 12/18/2020   HDL 43.60 12/18/2020   LDLCALC 61 12/18/2020   LDLDIRECT 77.0 02/16/2016  TRIG 102.0 12/18/2020   CHOLHDL 3 12/18/2020   A/P: Excellent control last year-update lipids with labs today  # Depression S: Medication: Pristiq 50 mg A/P: PHQ-2 of 0 today- well controlled- continue current meds  #Restless legs-Mirapex helpful at bedtime along with temazepam.  Also on gabapentin for neuropathic pain   #Bronchiectasis-remains on Symbicort   #B12 deficiency-remains on B12 supplement-update B12 labs today   #no recurrent issues with fistula- would want another opinion than Dr. Johney Maine  -mild memory issues mentioned at end of visit- advised getting hearing tested, update labs, if worsens- consider further workup and more formal memory testing -also advised awv  Recommended follow up: Return in about 4 months (around 05/02/2022) for followup or sooner if needed.Schedule b4 you leave. Future Appointments  Date Time Provider [place]  01/07/2022  2:45 PM Megan Salon, MD [facility]  01/29/2022  2:00 PM Parrett, Fonnie Mu, NP [facility] None   Lab/Order associations: fasting   ICD-10-CM   1. Preventative health care  Z00.00     2.  Poorly controlled diabetes mellitus (HCC)  E11.65 CBC with Differential/Platelet    Comprehensive metabolic panel    Lipid panel    Hemoglobin A1c    Microalbumin / creatinine urine ratio    3. Mixed hyperlipidemia  E78.2 TSH    4. Essential hypertension  I10 TSH    5. ANEMIA, B12 DEFICIENCY  D51.8 Vitamin B12     No orders of the defined types were placed in this encounter.  Return precautions advised.  [name], MD

## 2022-01-01 DIAGNOSIS — H10022 Other mucopurulent conjunctivitis, left eye: Secondary | ICD-10-CM | POA: Diagnosis not present

## 2022-01-01 LAB — MICROALBUMIN / CREATININE URINE RATIO
Creatinine,U: 158.2 mg/dL
Microalb Creat Ratio: 12.9 mg/g (ref 0.0–30.0)
Microalb, Ur: 20.3 mg/dL — ABNORMAL HIGH (ref 0.0–1.9)

## 2022-01-02 ENCOUNTER — Other Ambulatory Visit: Payer: Self-pay | Admitting: Family Medicine

## 2022-01-07 ENCOUNTER — Encounter (HOSPITAL_BASED_OUTPATIENT_CLINIC_OR_DEPARTMENT_OTHER): Payer: Self-pay | Admitting: Obstetrics & Gynecology

## 2022-01-07 ENCOUNTER — Ambulatory Visit (HOSPITAL_BASED_OUTPATIENT_CLINIC_OR_DEPARTMENT_OTHER): Payer: Medicare PPO | Admitting: Obstetrics & Gynecology

## 2022-01-07 VITALS — BP 146/75 | HR 81

## 2022-01-07 DIAGNOSIS — N952 Postmenopausal atrophic vaginitis: Secondary | ICD-10-CM | POA: Diagnosis not present

## 2022-01-07 DIAGNOSIS — R8290 Unspecified abnormal findings in urine: Secondary | ICD-10-CM

## 2022-01-08 ENCOUNTER — Telehealth: Payer: Self-pay | Admitting: Family Medicine

## 2022-01-08 NOTE — Telephone Encounter (Signed)
Rx refilled.

## 2022-01-10 NOTE — Progress Notes (Signed)
GYNECOLOGY  VISIT  CC:   estring removal/urine odor  HPI: 81 y.o. G0P0 Married White or Caucasian female here for removal and replacement of estring.  Denies vaginal bleeding.  Desires to continue using these.  Feels this does help with prevention of UTI.  Pt reports she does have odor in urine and desires testing.  Denies dysuria or hematuria.  No vaginal discharge.   Past Medical History:  Diagnosis Date   Allergy    Anemia    Anxiety    Arthritis    right knee;injections every 14months    Asthma    use daily   Breast cancer ([place]) 1994/1995   HX BREAST CANCER/ right brreast and left breast in 1995   Bronchiectasis ([place])    COMPRESSION FRACTURE, LUMBAR VERTEBRAE 08/21/2008   Qualifier: Diagnosis of  By: Arnoldo Morale MD, Balinda Quails    Depression    Diabetes mellitus    takes Amaryl and Januvia daily   Diverticulitis    Dyspnea    with activity   Early cataracts, bilateral    Eczema    Endometriosis    Fatty liver    Gastritis    Glaucoma    Hammer toe    History of kidney stones    Hyperlipidemia associated with type 2 diabetes mellitus ([place]) 07/26/2007   Diet/exercise control.     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      BD PEN NEEDLE NANO 2ND GEN 32G X 4 MM MISC USE AS DIRECTED DAILY 100 each 0   cetirizine (ZYRTEC) 10 MG tablet Take 10 mg by mouth every morning.      Cholecalciferol (VITAMIN D3) 125 MCG (5000 UT) TABS Take 5,000 Units by mouth daily.     Coenzyme Q10 (COQ10) 200 MG CAPS Take 200 mg by mouth daily.     desvenlafaxine (PRISTIQ) 50 MG 24 hr tablet Take 1 tablet (50 mg total) by mouth daily. 90 tablet 3   dicyclomine (BENTYL) 20 MG tablet TAKE 1 TABLET BY MOUTH 4 TIMES DAILY IN  THE  MORNING,  AT  NOON,  IN  THE  EVENING,  AND  AT  BEDTIME  120 tablet 0   dorzolamide-timolol (COSOPT) 22.3-6.8 MG/ML ophthalmic solution Place 1 drop into both eyes 2 (two) times daily.      ESTRING 2 MG RING Place 2 mg vaginally every 3 (three) months. INSERT 1 RING  INTO VAGINA EVERY 3 MONTHS 1 each 4   FIBER PO Take 2 tablets by mouth every evening.     fluticasone (FLONASE) 50 MCG/ACT nasal spray Place 1 spray into both nostrils daily. 18.2 mL 2   furosemide (LASIX) 20 MG tablet Take 1 tablet (20 mg total) by mouth daily as needed for fluid or edema. 30 tablet 0   gabapentin (NEURONTIN) 100 MG capsule TAKE 1 CAPSULE BY MOUTH THREE TIMES DAILY AS NEEDED FOR  NEUROPATHIC  PAIN 270 capsule 0   glimepiride (AMARYL) 4 MG tablet TAKE 2 TABLETS BY MOUTH ONCE DAILY WITH BREAKFAST 180 tablet 0   glucose blood (ACCU-CHEK GUIDE) test strip USE 1 STRIP ONCE DAILY TO  CHECK  GLUCOSE 100 each 0   Lancets Misc. (ACCU-CHEK FASTCLIX LANCET) KIT Use to test blood sugars daily. Dx: E11.9 1 kit 5   latanoprost (XALATAN) 0.005 % ophthalmic solution Place 1 drop into the right eye at bedtime.      lovastatin (MEVACOR) 10 MG tablet Take 1 tablet by  mouth once daily 90 tablet 0   montelukast (SINGULAIR) 10 MG tablet Take 1 tablet (10 mg total) by mouth at bedtime. 30 tablet 11   Multiple Vitamin (MULTIVITAMIN WITH MINERALS) TABS tablet Take 1 tablet by mouth daily. 3 Fruit Capsules 3 Vegetable Capsules - Balance of Nature 30 tablet 0   MYRBETRIQ 50 MG TB24 tablet Take 50 mg by mouth daily. Through urology     pramipexole (MIRAPEX) 0.5 MG tablet TAKE 1 TABLET BY MOUTH AT BEDTIME 90 tablet 0   ROCKLATAN 0.02-0.005 % SOLN Place 1 drop into the left eye at bedtime.      Selenium 200 MCG CAPS Take 200 mcg by mouth at bedtime.      Semaglutide,0.25 or 0.5MG /DOS, (OZEMPIC, 0.25 OR 0.5 MG/DOSE,) 2 MG/1.5ML SOPN Inject 0.25 mg as directed once a week for 28 days, THEN 0.5 mg once a week for 28 days. 2.25 mL 0   SYMBICORT 80-4.5 MCG/ACT inhaler Inhale 2 puffs into the lungs 2 (two) times daily. 11 g 3   temazepam (RESTORIL) 15 MG capsule Take 1 capsule (15 mg total) by mouth at bedtime. 30 capsule 5   valsartan (DIOVAN) 80 MG tablet Take 1 tablet (80 mg total) by mouth daily. 90 tablet 3   vitamin B-12 (CYANOCOBALAMIN) 1000 MCG tablet Take 1,000 mcg by mouth daily.     No current facility-administered medications on file prior to visit.    ALLERGIES: Nitrofurantoin, Levaquin [levofloxacin in d5w], Brimonidine, Cephalexin, Erythromycin ethylsuccinate, and Oxycodone  SH:  married, non smoker  Review of Systems  Constitutional: Negative.     PHYSICAL EXAMINATION:    BP (!) 146/75 (BP Location: Right Arm, Patient Position: Sitting, Cuff Size: Large)   Pulse 81   LMP 05/10/1992 Comment: partial    General appearance: alert, cooperative and appears stated age Lymph:  no inguinal LAD noted  Pelvic: External genitalia:  no lesions              Urethra:  normal appearing urethra with no masses, tenderness or lesions              Bartholins and Skenes: normal  Vagina: normal appearing vagina with normal color and discharge, no  lesions              Cervix: no lesions              Bimanual Exam:  Uterus:  normal size, contour, position, consistency, mobility, non-tender              Adnexa: no mass, fullness, tenderness            Estring removed and replaced without difficulty.  Assessment/Plan: 1. Vaginal atrophy - will replace Estring again in 3-4 months.  Does not need rx for this right now as this was done in July for the next year.  2. Abnormal urine odor - will check urine culture today - Urine Culture

## 2022-01-12 LAB — URINE CULTURE

## 2022-01-14 ENCOUNTER — Other Ambulatory Visit (HOSPITAL_BASED_OUTPATIENT_CLINIC_OR_DEPARTMENT_OTHER): Payer: Self-pay | Admitting: *Deleted

## 2022-01-14 MED ORDER — SULFAMETHOXAZOLE-TRIMETHOPRIM 800-160 MG PO TABS: 1.0000 | ORAL_TABLET | Freq: Two times a day (BID) | ORAL | 0 refills | Status: AC

## 2022-01-14 NOTE — Progress Notes (Signed)
Rx sent to pharmacy for treatment of positive urine culture 

## 2022-01-17 ENCOUNTER — Other Ambulatory Visit: Payer: Self-pay | Admitting: Family Medicine

## 2022-01-17 ENCOUNTER — Other Ambulatory Visit: Payer: Self-pay | Admitting: Gastroenterology

## 2022-01-18 ENCOUNTER — Other Ambulatory Visit: Payer: Self-pay | Admitting: Nurse Practitioner

## 2022-01-18 DIAGNOSIS — J301 Allergic rhinitis due to pollen: Secondary | ICD-10-CM

## 2022-01-19 DIAGNOSIS — H401112 Primary open-angle glaucoma, right eye, moderate stage: Secondary | ICD-10-CM | POA: Diagnosis not present

## 2022-01-19 DIAGNOSIS — H401123 Primary open-angle glaucoma, left eye, severe stage: Secondary | ICD-10-CM | POA: Diagnosis not present

## 2022-01-19 DIAGNOSIS — H1045 Other chronic allergic conjunctivitis: Secondary | ICD-10-CM | POA: Diagnosis not present

## 2022-01-22 DIAGNOSIS — M179 Osteoarthritis of knee, unspecified: Secondary | ICD-10-CM | POA: Diagnosis not present

## 2022-01-22 DIAGNOSIS — J4520 Mild intermittent asthma, uncomplicated: Secondary | ICD-10-CM | POA: Diagnosis not present

## 2022-01-22 DIAGNOSIS — S42309A Unspecified fracture of shaft of humerus, unspecified arm, initial encounter for closed fracture: Secondary | ICD-10-CM | POA: Diagnosis not present

## 2022-01-22 DIAGNOSIS — J9601 Acute respiratory failure with hypoxia: Secondary | ICD-10-CM | POA: Diagnosis not present

## 2022-01-27 ENCOUNTER — Encounter: Payer: Self-pay | Admitting: Family Medicine

## 2022-01-27 ENCOUNTER — Ambulatory Visit: Payer: Medicare PPO | Admitting: Family Medicine

## 2022-01-27 VITALS — BP 130/63 | HR 84 | Temp 97.3°F | Ht 72.0 in

## 2022-01-27 DIAGNOSIS — Z23 Encounter for immunization: Secondary | ICD-10-CM

## 2022-01-27 DIAGNOSIS — R300 Dysuria: Secondary | ICD-10-CM

## 2022-01-27 DIAGNOSIS — N390 Urinary tract infection, site not specified: Secondary | ICD-10-CM

## 2022-01-27 DIAGNOSIS — Z79899 Other long term (current) drug therapy: Secondary | ICD-10-CM | POA: Diagnosis not present

## 2022-01-27 DIAGNOSIS — I10 Essential (primary) hypertension: Secondary | ICD-10-CM | POA: Diagnosis not present

## 2022-01-27 MED ORDER — METHENAMINE HIPPURATE 1 G PO TABS: 1.0000 g | ORAL_TABLET | Freq: Two times a day (BID) | ORAL | 11 refills | Status: AC

## 2022-01-27 NOTE — Progress Notes (Signed)
Phone [phone] In person visit   Subjective:   [name] is a 81 y.o. year old very pleasant female patient who presents for/with See problem oriented charting Chief Complaint  Patient presents with   discuss antibiotic    Pt wants to discuss a low dose antibiotic to keep from having UTI's, she did see urology but she is still having problems with UTI.    Past Medical History-  Patient Active Problem List   Diagnosis Date Noted   Chronic UTI 02/23/2021    Priority: High   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 11/22/2019    Priority: High   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    Priority: High   Bronchiectasis ([place]) 10/18/2015    Priority: High   Recurrent major depression in full remission ([place]) 01/03/2014    Priority: High   Poorly controlled diabetes mellitus ([place]) 12/28/2006    Priority: High   Adenomatous polyp 12/20/2018    Priority: Medium    Personal history of colonic polyps 05/18/2017    Priority: Medium    Aortic atherosclerosis ([place]) 02/16/2016    Priority: Medium    Solitary pulmonary nodule 01/08/2016    Priority: Medium    IBS (irritable bowel syndrome) 07/17/2014    Priority: Medium    Essential hypertension 01/03/2014    Priority: Medium    Primary open-angle glaucoma 08/02/2012    Priority: Medium    Fatty liver 03/18/2009    Priority: Medium    Hyperlipidemia 07/26/2007    Priority: Medium    ANEMIA, B12 DEFICIENCY 12/29/2006    Priority: Medium    RESTLESS LEG SYNDROME, MILD 12/28/2006    Priority: Medium    Osteoporosis 12/12/2006    Priority: Medium    History of total knee replacement, bilateral 11/14/2017    Priority: Low   Arthritis of midfoot 03/03/2016    Priority: Low   Hammertoes of both feet 03/03/2016    Priority: Low   Posterior tibial tendon dysfunction 01/03/2014    Priority: Low   Osteoarthritis, knee 01/03/2014    Priority: Low   Glaucoma 01/03/2014    Priority: Low   Obesity (BMI 30-39.9)  06/15/2013    Priority: Low   Foot tendinitis 07/20/2010    Priority: Low   INSOMNIA, CHRONIC 07/25/2009    Priority: Low   GERD 12/25/2007    Priority: Low   Idiopathic peripheral neuropathy 12/28/2006    Priority: Low   BREAST CANCER, HX OF 12/12/2006    Priority: Low   Chronic diastolic heart failure ([place]) 09/02/2021   Right lower quadrant abdominal pain 06/16/2021   Chronic respiratory failure with hypoxia ([place]) 04/29/2021   Fatigue 04/29/2021   Hospital discharge follow-up 04/21/2021   Perineal sinus 03/25/2021   Heart palpitations 02/11/2021   Ankle edema, bilateral 02/11/2021   Hoarseness 10/17/2020   Physical deconditioning 02/22/2019   Dyspnea on exertion 02/22/2019   Pain due to onychomycosis of toenails of both feet 11/08/2018    Medications- reviewed and updated Current Outpatient Medications  Medication Sig Dispense Refill   ACCU-CHEK GUIDE test strip USE 1 TO TEST UP TO TWICE DAILY 200 each 0   atenolol (TENORMIN) 25 MG tablet Take 1 tablet by mouth once daily 90 tablet 0   Bacillus Coagulans-Inulin (ALIGN PREBIOTIC-PROBIOTIC PO) Take 1 capsule by mouth daily.      BD PEN NEEDLE NANO 2ND GEN 32G X 4 MM MISC USE AS DIRECTED DAILY 100 each 0   cetirizine (ZYRTEC) 10 MG tablet Take  10 mg by mouth every morning.      Cholecalciferol (VITAMIN D3) 125 MCG (5000 UT) TABS Take 5,000 Units by mouth daily.     Coenzyme Q10 (COQ10) 200 MG CAPS Take 200 mg by mouth daily.     desvenlafaxine (PRISTIQ) 50 MG 24 hr tablet Take 1 tablet (50 mg total) by mouth daily. 90 tablet 3   dicyclomine (BENTYL) 20 MG tablet TAKE 1 TABLET BY MOUTH 4 TIMES DAILY IN  THE  MORNING,  AT  NOON,  IN  THE  EVENING,  AND  AT  BEDTIME 120 tablet 0   dorzolamide-timolol (COSOPT) 22.3-6.8 MG/ML ophthalmic solution Place 1 drop into both eyes 2 (two) times daily.      ESTRING 2 MG RING Place 2 mg vaginally every 3 (three) months. INSERT 1 RING  INTO VAGINA EVERY 3 MONTHS 1 each 4   FIBER PO Take 2  tablets by mouth every evening.     fluticasone (FLONASE) 50 MCG/ACT nasal spray Use 1 spray(s) in each nostril once daily 16 g 11   furosemide (LASIX) 20 MG tablet Take 1 tablet (20 mg total) by mouth daily as needed for fluid or edema. 30 tablet 0   gabapentin (NEURONTIN) 100 MG capsule TAKE 1 CAPSULE BY MOUTH THREE TIMES DAILY AS NEEDED FOR  NEUROPATHIC  PAIN 270 capsule 0   glimepiride (AMARYL) 4 MG tablet TAKE 2 TABLETS BY MOUTH ONCE DAILY WITH BREAKFAST 180 tablet 0   glucose blood (ACCU-CHEK GUIDE) test strip USE 1 STRIP ONCE DAILY TO  CHECK  GLUCOSE 100 each 0   Lancets Misc. (ACCU-CHEK FASTCLIX LANCET) KIT Use to test blood sugars daily. Dx: E11.9 1 kit 5   latanoprost (XALATAN) 0.005 % ophthalmic solution Place 1 drop into the right eye at bedtime.      lovastatin (MEVACOR) 10 MG tablet Take 1 tablet by mouth once daily 90 tablet 0   methenamine (HIPREX) 1 g tablet Take 1 tablet (1 g total) by mouth 2 (two) times daily with a meal. 60 tablet 11   montelukast (SINGULAIR) 10 MG tablet Take 1 tablet (10 mg total) by mouth at bedtime. 30 tablet 11   Multiple Vitamin (MULTIVITAMIN WITH MINERALS) TABS tablet Take 1 tablet by mouth daily. 3 Fruit Capsules 3 Vegetable Capsules - Balance of Nature 30 tablet 0   MYRBETRIQ 50 MG TB24 tablet Take 50 mg by mouth daily. Through urology     pramipexole (MIRAPEX) 0.5 MG tablet TAKE 1 TABLET BY MOUTH AT BEDTIME 90 tablet 0   ROCKLATAN 0.02-0.005 % SOLN Place 1 drop into the left eye at bedtime.      Selenium 200 MCG CAPS Take 200 mcg by mouth at bedtime.      Semaglutide,0.25 or 0.5MG/DOS, (OZEMPIC, 0.25 OR 0.5 MG/DOSE,) 2 MG/1.5ML SOPN Inject 0.25 mg as directed once a week for 28 days, THEN 0.5 mg once a week for 28 days. 2.25 mL 0   SYMBICORT 80-4.5 MCG/ACT inhaler Inhale 2 puffs into the lungs 2 (two) times daily. 11 g 3   temazepam (RESTORIL) 15 MG capsule Take 1 capsule (15 mg total) by mouth at bedtime. 30 capsule 5   TRESIBA FLEXTOUCH 200 UNIT/ML  FlexTouch Pen Inject 80 Units into the skin in the morning. 9 mL 3   valsartan (DIOVAN) 80 MG tablet Take 1 tablet (80 mg total) by mouth daily. 90 tablet 3   vitamin B-12 (CYANOCOBALAMIN) 1000 MCG tablet Take 1,000 mcg by mouth daily.  No current facility-administered medications for this visit.     Objective:  BP 130/63   Pulse 84   Temp (!) 97.3 F (36.3 C)   Ht 6' (1.829 m)   LMP 05/10/1992 Comment: partial  SpO2 92%   BMI 34.31 kg/m  Gen: NAD, resting comfortably CV: RRR no murmurs rubs or gallops Lungs: CTAB no crackles, wheeze, rhonchi    Assessment and Plan   #Recurrent UTIs S:most recent UTI was late august - E. Coli 01/07/22 despite taking cranberry recommended by urology. Prior fistula did cause issues but appears to be closed now - no current symptoms other than some vaginal itch- treating with topicals originally reported-later in conversation patient states that she has developed some odor as well as burning with urination and is in fact concerned about current UTI  Reached out to urologist Dr. Claudia Desanctis through nurse. Was given a few suggestions on [place] which she had not tried. She also had read about [place]  UTI Jan 07 2022- e coli pan sensitive Also February 13 E. Coli resistant to bactrim March 04 2021 E Coli resistant to bactrim Lab Results  Component Value Date   ALT 15 12/31/2021   AST 19 12/31/2021   ALKPHOS 85 12/31/2021   BILITOT 0.5 12/31/2021  Does have fatty liver  A/P: We developed a plan based on patient currently being asymptomatic for UTI but later in conversation patient stated she was in fact having current symptoms-we first need to get a urine culture to evaluate for UTI.  If there is a current UTI we will start with treatment - If does not have UTI will start methenamine - If has UTI we will need to treat with antibiotics and then retest to show clearance before starting methenamine - 6 weeks after starting methenamine will need to come  back for liver function tests and then can recheck in December visit -Not sexually active so postcoital prophylaxis would be low yield  She asks about antibiotic prophylaxis-my concern with this is history of more resistant UTIs in her past at time of fistula as well as multiple antibiotic allergies or intolerances.  With nitrofurantoin she has severe headaches.  With cephalexin she develops diarrhea.  With Bactrim resistant on 2 over the last 3 UTI evaluations with E. coli.  We ultimately thought methenamine was a better choice though she is concerned about liver health with fatty liver   Recommended follow up: Return for as needed for new, worsening, persistent symptoms. Future Appointments  Date Time Provider [place]  01/29/2022  2:00 PM Parrett, Fonnie Mu, NP [facility] None  04/30/2022  2:20 PM Yong Channel Brayton Mars, MD [facility]   Lab/Order associations:   ICD-10-CM   1. Recurrent UTI  N39.0     2. High risk medication use  Z79.899 Comprehensive metabolic panel    Comprehensive metabolic panel    3. Essential hypertension  I10 Comprehensive metabolic panel    Comprehensive metabolic panel    4. Dysuria  R30.0 Urine Culture    5. Need for influenza vaccination  Z23 CANCELED: Flu vaccine HIGH DOSE PF (Fluzone High dose)    6. Need for immunization against influenza  Z23 Flu Vaccine QUAD High Dose(Fluad)      Meds ordered this encounter  Medications   methenamine (HIPREX) 1 g tablet    Sig: Take 1 tablet (1 g total) by mouth 2 (two) times daily with a meal.    Dispense:  60 tablet    Refill:  11    Time Spent: 36 minutes of total time (3:50 PM- 4:26 PM) was spent on the date of the encounter performing the following actions: chart review prior to seeing the patient, obtaining history, performing a medically necessary exam, counseling on the treatment plan and in fact reasons we need to avoid certain treatments most likely such as antimicrobial prophylaxis, placing  orders, and documenting in our EHR.    Return precautions advised.  [name], MD

## 2022-01-27 NOTE — Patient Instructions (Addendum)
Do not start methenamine until we are sure there is no UTI- we are doing a culture today- if negative you can start  If positive we need to treat and document clearance before starting  Please stop by lab before you go If you have mychart- we will send your results within 3 business days of [name] receiving them.  If you do not have mychart- we will call you about results within 5 business days of [name] receiving them.  *please also note that you will see labs on mychart as soon as they post. I will later go in and write notes on them- will say "notes from Dr. Yong Channel"   Schedule lab for 6 weeks AFTER you start methanamine to test liver. Schedule this once we know there is no UTI whether this week or in a few weeks on restest (if we have to use antibiotics)  Recommended follow up: Return for as needed for new, worsening, persistent symptoms.

## 2022-01-28 LAB — COMPREHENSIVE METABOLIC PANEL
ALT: 17 U/L (ref 0–35)
AST: 18 U/L (ref 0–37)
Albumin: 3.7 g/dL (ref 3.5–5.2)
Alkaline Phosphatase: 101 U/L (ref 39–117)
BUN: 11 mg/dL (ref 6–23)
CO2: 31 mEq/L (ref 19–32)
Calcium: 9.2 mg/dL (ref 8.4–10.5)
Chloride: 100 mEq/L (ref 96–112)
Creatinine, Ser: 0.69 mg/dL (ref 0.40–1.20)
GFR: 81.04 mL/min (ref >60.00)
Glucose, Bld: 187 mg/dL — ABNORMAL HIGH (ref 70–99)
Potassium: 3.8 mEq/L (ref 3.5–5.1)
Sodium: 140 mEq/L (ref 135–145)
Total Bilirubin: 0.4 mg/dL (ref 0.2–1.2)
Total Protein: 6.6 g/dL (ref 6.0–8.3)

## 2022-01-28 LAB — URINE CULTURE
MICRO NUMBER:: 13944142
SPECIMEN QUALITY:: ADEQUATE

## 2022-01-29 ENCOUNTER — Encounter: Payer: Self-pay | Admitting: Family Medicine

## 2022-01-29 ENCOUNTER — Ambulatory Visit: Payer: Medicare PPO | Admitting: Adult Health

## 2022-01-29 ENCOUNTER — Encounter: Payer: Self-pay | Admitting: Adult Health

## 2022-01-29 ENCOUNTER — Other Ambulatory Visit: Payer: Self-pay | Admitting: Family Medicine

## 2022-01-29 DIAGNOSIS — R490 Dysphonia: Secondary | ICD-10-CM | POA: Diagnosis not present

## 2022-01-29 DIAGNOSIS — E669 Obesity, unspecified: Secondary | ICD-10-CM | POA: Diagnosis not present

## 2022-01-29 DIAGNOSIS — R5381 Other malaise: Secondary | ICD-10-CM

## 2022-01-29 DIAGNOSIS — J479 Bronchiectasis, uncomplicated: Secondary | ICD-10-CM | POA: Diagnosis not present

## 2022-01-29 DIAGNOSIS — I5032 Chronic diastolic (congestive) heart failure: Secondary | ICD-10-CM | POA: Diagnosis not present

## 2022-01-29 NOTE — Assessment & Plan Note (Signed)
Appears euvolemic continue on current regimen

## 2022-01-29 NOTE — Assessment & Plan Note (Signed)
Appears stable.  Continue on current regimen.  Mucociliary clearance encouraged.  Plan  Patient Instructions  Continue on Symbicort 2 puffs Twice daily, rinse after use.  Refer to ENT for hoarseness .  Albuterol inhaler As needed   Activity as tolerated.  Follow up with Dr. Lamonte Sakai in 6 months and As needed   Please contact office for sooner follow up if symptoms do not improve or worsen or seek emergency care

## 2022-01-29 NOTE — Patient Instructions (Addendum)
Continue on Symbicort 2 puffs Twice daily, rinse after use.  Refer to ENT for hoarseness .  Albuterol inhaler As needed   Activity as tolerated.  Follow up with Dr. Lamonte Sakai in 6 months and As needed   Please contact office for sooner follow up if symptoms do not improve or worsen or seek emergency care

## 2022-01-29 NOTE — Assessment & Plan Note (Signed)
Healthy weight loss discussed 

## 2022-01-29 NOTE — Assessment & Plan Note (Signed)
Chronic hoarseness for greater than 6 months.  Did not improve after changing ACE inhibitor.  No improvement after Zyrtec and Nasonex.  Denies any reflux symptoms.  Will refer to ENT for further evaluation

## 2022-01-29 NOTE — Assessment & Plan Note (Signed)
Patient has severe physical deconditioning.  Activity as tolerated

## 2022-01-29 NOTE — Progress Notes (Signed)
$'@Patient'I$  ID: [name], female    DOB: [birth year], 81 y.o.   MRN: [id number]  Chief Complaint  Patient presents with   Follow-up    Referring provider: Marin Olp, MD  HPI: 81 year old female never smoker followed for bronchiectasis and tracheomalacia  TEST/EVENTS :  Pulmonary function testing 12/02/2015 , shows mixed restriction and obstruction by spirometry without a bronchodilator response, normal lung volumes and a decreased diffusion capacity that corrects to the normal range when adjusted for alveolar volume.   High-resolution CT chest December 04, 2015 showed negative for ILD.  Severe tracheomalacia, mild bronchiectasis and multiple tiny pulmonary nodules 4 mm or less  PFTs June 01, 2021 shows a moderate restriction with FEV1 at 51%, ratio 77, FVC 50%, DLCO 76%  CT chest June 10, 2021 shows left small effusion, mild bronchiectasis, small stable pulmonary nodules  Coronary CT chest October 08, 2021 showed resolution of small left pleural effusion.  No acute pulmonary process noted in the visualized lung fields  01/29/2022 Follow up  : Bronchiectasis and tracheomalacia Patient presents for a 69-month follow-up.  Patient has underlying mild bronchiectasis.  Remains on Symbicort twice daily.  Patient says overall breathing is doing okay.  She does complain of ongoing hoarseness for the last 6 months or more.  She says she has been taking Zyrtec and Nasonex without much relief.  She denies any reflux.  Denies any dysphagia or choking.  Previously lisinopril was changed in case it was causing some upper airway cough and hoarseness.  Did not see any change in hoarseness after this change was made.  No unintentional weight loss.  Patient says she is sedentary.  Not able to do any housework or cooking.  Her husband is the caregiver. She was previously on oxygen but returned this several months ago.  O2 saturations today are 93% on room air Patient says she can only walk short  distances before she gets out.  Current weight is at 254 pounds with a BMI at 34.   Allergies  Allergen Reactions   Nitrofurantoin Other (See Comments)    Severe headache HEADACHE   Levaquin [Levofloxacin In D5w] Other (See Comments)    Pain in tendons    Brimonidine Other (See Comments)    Made eyes and surrounding areas RED/ was an eye drop   Cephalexin Diarrhea and Other (See Comments)    Patient can't remember reaction (per chart at [facility] states diarrhea) (tolerates Augmentin fine)   Erythromycin Ethylsuccinate Hives and Diarrhea   Oxycodone Itching    Immunization History  Administered Date(s) Administered   Fluad Quad(high Dose 65+) 02/22/2019, 02/20/2020, 02/18/2021, 01/27/2022   H1N1 06/05/2008   Influenza Split 02/07/2012   Influenza Whole 03/10/2000, 01/24/2008, 03/10/2009, 01/20/2010   Influenza, High Dose Seasonal PF 02/12/2013, 01/31/2017, 01/19/2018   Influenza,inj,Quad PF,6+ Mos 01/17/2014, 03/27/2015   Influenza,inj,quad, With Preservative 02/07/2017   Influenza-Unspecified 02/09/2016   PFIZER(Purple Top)SARS-COV-2 Vaccination 06/03/2019, 06/25/2019, 04/24/2020   Pneumococcal Conjugate-13 01/17/2014   Pneumococcal Polysaccharide-23 03/10/2000, 05/02/2007, 01/02/2012   Td 05/10/1992, 12/25/2007   Zoster Recombinat (Shingrix) 02/24/2018, 04/26/2018    Past Medical History:  Diagnosis Date   Allergy    Anemia    Anxiety    Arthritis    right knee;injections every 20months    Asthma    use daily   Breast cancer ([place]) 1994/1995   HX BREAST CANCER/ right brreast and left breast in 1995   Bronchiectasis ([place])    COMPRESSION FRACTURE, LUMBAR VERTEBRAE 08/21/2008  Qualifier: Diagnosis of  By: Arnoldo Morale MD, Balinda Quails    Depression    Diabetes mellitus    takes Amaryl and Januvia daily   Diverticulitis    Dyspnea    with activity   Early cataracts, bilateral    Eczema    Endometriosis    Fatty liver    Gastritis    Glaucoma    Hammer toe    History of  kidney stones    Hyperlipidemia associated with type 2 diabetes mellitus ([place]) 07/26/2007   Diet/exercise control.     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      BD PEN NEEDLE NANO 2ND GEN 32G X 4 MM MISC USE AS DIRECTED DAILY 100 each 0   cetirizine (ZYRTEC) 10 MG tablet Take 10 mg by mouth every morning.      Cholecalciferol (VITAMIN D3) 125 MCG (5000 UT) TABS Take 5,000 Units by mouth daily.     Coenzyme Q10 (COQ10) 200 MG CAPS Take 200 mg by mouth daily.     desvenlafaxine (PRISTIQ) 50 MG 24 hr tablet Take 1 tablet (50 mg total) by mouth daily. 90 tablet 3   dicyclomine (BENTYL) 20 MG tablet TAKE 1 TABLET BY MOUTH 4 TIMES DAILY IN  THE  MORNING,  AT  NOON,  IN  THE  EVENING,  AND  AT  BEDTIME 120 tablet 0   dorzolamide-timolol  (COSOPT) 22.3-6.8 MG/ML ophthalmic solution Place 1 drop into both eyes 2 (two) times daily.      ESTRING 2 MG RING Place 2 mg vaginally every 3 (three) months. INSERT 1 RING  INTO VAGINA EVERY 3 MONTHS 1 each 4   FIBER PO Take 2 tablets by mouth every evening.     fluticasone (FLONASE) 50 MCG/ACT nasal spray Use 1 spray(s) in each nostril once daily 16 g 11   furosemide (LASIX) 20 MG tablet Take 1 tablet (20 mg total) by mouth daily as needed for fluid or edema. 30 tablet 0   gabapentin (NEURONTIN) 100 MG capsule TAKE 1 CAPSULE BY MOUTH  THREE TIMES DAILY AS NEEDED FOR  NEUROPATHIC  PAIN 270 capsule 0   glimepiride (AMARYL) 4 MG tablet TAKE 2 TABLETS BY MOUTH ONCE DAILY WITH BREAKFAST 180 tablet 0   glucose blood (ACCU-CHEK GUIDE) test strip USE 1 STRIP ONCE DAILY TO  CHECK  GLUCOSE 100 each 0   Lancets Misc. (ACCU-CHEK FASTCLIX LANCET) KIT Use to test blood sugars daily. Dx: E11.9 1 kit 5   latanoprost (XALATAN) 0.005 % ophthalmic solution Place 1 drop into the right eye at bedtime.      lovastatin (MEVACOR) 10 MG tablet Take 1 tablet by mouth once daily 90 tablet 0   methenamine (HIPREX) 1 g tablet Take 1 tablet (1 g total) by mouth 2 (two) times daily with a meal. 60 tablet 11   montelukast (SINGULAIR) 10 MG tablet Take 1 tablet (10 mg total) by mouth at bedtime. 30 tablet 11   Multiple Vitamin (MULTIVITAMIN WITH MINERALS) TABS tablet Take 1 tablet by mouth daily. 3 Fruit Capsules 3 Vegetable Capsules - Balance of Nature 30 tablet 0   MYRBETRIQ 50 MG TB24 tablet Take 50 mg by mouth daily. Through urology     pramipexole (MIRAPEX) 0.5 MG tablet TAKE 1 TABLET BY MOUTH AT BEDTIME 90 tablet 0   ROCKLATAN 0.02-0.005 % SOLN Place 1 drop into the left eye at bedtime.      Selenium 200 MCG CAPS Take 200 mcg by mouth at bedtime.      Semaglutide,0.25 or 0.5MG /DOS, (OZEMPIC, 0.25 OR 0.5 MG/DOSE,) 2 MG/1.5ML SOPN Inject 0.25 mg as directed once a week for 28 days, THEN 0.5 mg once a week for 28 days. 2.25  mL 0   SYMBICORT 80-4.5 MCG/ACT inhaler Inhale 2 puffs into the lungs 2 (two) times daily. 11 g 3   temazepam (RESTORIL) 15 MG capsule Take 1 capsule (15 mg total) by mouth at bedtime. 30 capsule 5   TRESIBA FLEXTOUCH 200 UNIT/ML FlexTouch Pen Inject 80 Units into the skin in the morning. 9 mL 3   valsartan (DIOVAN) 80 MG tablet Take 1 tablet (80 mg total) by mouth daily. 90 tablet 3   vitamin B-12 (CYANOCOBALAMIN) 1000 MCG tablet Take 1,000 mcg by mouth daily.     No facility-administered medications prior to visit.     Review of Systems:   Constitutional:   No  weight loss, night sweats,  Fevers, chills,  +fatigue, or  lassitude.  HEENT:   No headaches,  Difficulty swallowing,  Tooth/dental problems, or  Sore throat,                No sneezing, itching, ear ache, nasal congestion, post nasal drip, positive hoarseness  CV:  No chest pain,  Orthopnea, PND, swelling in lower extremities, anasarca, dizziness, palpitations, syncope.   GI  No heartburn, indigestion, abdominal pain, nausea, vomiting, diarrhea, change in bowel habits, loss of appetite, bloody stools.   Resp: No excess mucus, no productive cough,  No non-productive cough,  No coughing up of blood.  No change in color of mucus.  No wheezing.  No chest wall deformity  Skin: no rash or lesions.  GU: no dysuria, change in color of urine, no urgency or frequency.  No flank pain, no hematuria   MS:  No joint pain or swelling.  No decreased range of motion.  No back pain.    Physical Exam  BP 130/66 (BP Location: Right Arm, Patient Position: Sitting, Cuff Size: Large)   Pulse 80   Temp 98.4  F (36.9 C) (Oral)   Ht 6' (1.829 m)   Wt 254 lb (115.2 kg)   LMP 05/10/1992 Comment: partial  SpO2 93%   BMI 34.45 kg/m   GEN: A/Ox3; pleasant , NAD, well nourished    HEENT:  [place]/AT,  EACs-bilateral cerumen impaction, NOSE-clear, THROAT-clear, no lesions, no postnasal drip or exudate noted.   NECK:  Supple w/ fair ROM; no JVD;  normal carotid impulses w/o bruits; no thyromegaly or nodules palpated; no lymphadenopathy.    RESP  Clear  P & A; w/o, wheezes/ rales/ or rhonchi. no accessory muscle use, no dullness to percussion  CARD:  RRR, no m/r/g, tr  peripheral edema, pulses intact, no cyanosis or clubbing.  GI:   Soft & nt; nml bowel sounds; no organomegaly or masses detected.   Musco: Warm bil, no deformities or joint swelling noted.   Neuro: alert, no focal deficits noted.    Skin: Warm, no lesions or rashes    Lab Results:  CBC  Imaging: No results found.       Latest Ref Rng & Units 06/01/2021    2:52 PM 12/02/2015   11:51 AM  PFT Results  FVC-Pre L 1.70  2.52   FVC-Predicted Pre % 48  67   FVC-Post L 1.79  2.63   FVC-Predicted Post % 50  70   Pre FEV1/FVC % % 78  77   Post FEV1/FCV % % 77  77   FEV1-Pre L 1.32  1.94   FEV1-Predicted Pre % 49  68   FEV1-Post L 1.37  2.02   DLCO uncorrected ml/min/mmHg 18.21  25.72   DLCO UNC% % 76  74   DLCO corrected ml/min/mmHg 18.21  25.96   DLCO COR %Predicted % 76  75   DLVA Predicted % 125  94   TLC L 4.32  5.02   TLC % Predicted % 69  81   RV % Predicted % 86  91     No results found for: "NITRICOXIDE"      Assessment & Plan:   No problem-specific Assessment & Plan notes found for this encounter.     Rexene Edison, NP 01/29/2022

## 2022-02-05 ENCOUNTER — Other Ambulatory Visit: Payer: Self-pay | Admitting: Family Medicine

## 2022-02-05 ENCOUNTER — Other Ambulatory Visit: Payer: Self-pay | Admitting: Urology

## 2022-02-08 ENCOUNTER — Other Ambulatory Visit: Payer: Self-pay | Admitting: Family Medicine

## 2022-02-10 ENCOUNTER — Telehealth: Payer: Self-pay | Admitting: *Deleted

## 2022-02-10 DIAGNOSIS — R490 Dysphonia: Secondary | ICD-10-CM

## 2022-02-10 NOTE — Telephone Encounter (Signed)
Patient called and states she has not received any information about an ENT referral. She would like to be contacted once ENT referral is in place

## 2022-02-10 NOTE — Telephone Encounter (Signed)
I called the patient and let her know I have placed an urgent referral. She was appreciative of the call. Nothing further needed.

## 2022-02-19 ENCOUNTER — Other Ambulatory Visit: Payer: Self-pay | Admitting: Family Medicine

## 2022-02-26 ENCOUNTER — Other Ambulatory Visit: Payer: Self-pay | Admitting: Urology

## 2022-02-26 ENCOUNTER — Other Ambulatory Visit: Payer: Self-pay | Admitting: Family Medicine

## 2022-03-06 ENCOUNTER — Other Ambulatory Visit: Payer: Self-pay | Admitting: Urology

## 2022-03-09 ENCOUNTER — Encounter: Payer: Self-pay | Admitting: Family Medicine

## 2022-03-09 ENCOUNTER — Other Ambulatory Visit: Payer: Self-pay | Admitting: Family Medicine

## 2022-03-09 DIAGNOSIS — R0602 Shortness of breath: Secondary | ICD-10-CM

## 2022-03-12 ENCOUNTER — Other Ambulatory Visit: Payer: Self-pay | Admitting: Family Medicine

## 2022-03-19 ENCOUNTER — Other Ambulatory Visit: Payer: Self-pay | Admitting: Gastroenterology

## 2022-03-19 ENCOUNTER — Other Ambulatory Visit: Payer: Self-pay | Admitting: Family Medicine

## 2022-03-22 ENCOUNTER — Encounter: Payer: Self-pay | Admitting: Family Medicine

## 2022-03-22 ENCOUNTER — Other Ambulatory Visit (HOSPITAL_COMMUNITY)
Admission: RE | Admit: 2022-03-22 | Discharge: 2022-03-22 | Disposition: A | Discharge status: Home / Self Care | Payer: Medicare PPO | Source: Ambulatory Visit | Attending: Obstetrics & Gynecology | Admitting: Obstetrics & Gynecology

## 2022-03-22 ENCOUNTER — Ambulatory Visit (INDEPENDENT_AMBULATORY_CARE_PROVIDER_SITE_OTHER): Payer: Medicare PPO

## 2022-03-22 ENCOUNTER — Other Ambulatory Visit: Payer: Self-pay | Admitting: Family Medicine

## 2022-03-22 DIAGNOSIS — R300 Dysuria: Secondary | ICD-10-CM

## 2022-03-22 DIAGNOSIS — N898 Other specified noninflammatory disorders of vagina: Secondary | ICD-10-CM

## 2022-03-22 LAB — POCT URINALYSIS DIPSTICK
Bilirubin, UA: NEGATIVE
Glucose, UA: NEGATIVE
Ketones, UA: NEGATIVE
Nitrite, UA: POSITIVE
Protein, UA: POSITIVE — AB
Spec Grav, UA: 1.02 (ref 1.010–1.025)
Urobilinogen, UA: 0.2 E.U./dL
pH, UA: 5.5 (ref 5.0–8.0)

## 2022-03-22 MED ORDER — SEMAGLUTIDE (1 MG/DOSE) 4 MG/3ML ~~LOC~~ SOPN: 0.5000 mg | PEN_INJECTOR | SUBCUTANEOUS | 5 refills | Status: DC

## 2022-03-22 NOTE — Progress Notes (Signed)
Patient came in today with complaints of vaginal itching/burning. She also states that she is having some dysuria. Aptima swab was obtained as well as urine sample. Urine was sent off for culture. tbw

## 2022-03-23 ENCOUNTER — Telehealth (HOSPITAL_BASED_OUTPATIENT_CLINIC_OR_DEPARTMENT_OTHER): Payer: Self-pay

## 2022-03-23 ENCOUNTER — Encounter: Payer: Self-pay | Admitting: Family Medicine

## 2022-03-23 ENCOUNTER — Other Ambulatory Visit: Payer: Self-pay | Admitting: Family Medicine

## 2022-03-23 ENCOUNTER — Other Ambulatory Visit (HOSPITAL_BASED_OUTPATIENT_CLINIC_OR_DEPARTMENT_OTHER): Payer: Self-pay | Admitting: Obstetrics & Gynecology

## 2022-03-23 ENCOUNTER — Other Ambulatory Visit (HOSPITAL_BASED_OUTPATIENT_CLINIC_OR_DEPARTMENT_OTHER): Payer: Self-pay

## 2022-03-23 DIAGNOSIS — N3090 Cystitis, unspecified without hematuria: Secondary | ICD-10-CM

## 2022-03-23 DIAGNOSIS — R300 Dysuria: Secondary | ICD-10-CM | POA: Diagnosis not present

## 2022-03-23 LAB — CERVICOVAGINAL ANCILLARY ONLY
Bacterial Vaginitis (gardnerella): POSITIVE — AB
Candida Glabrata: POSITIVE — AB
Candida Vaginitis: POSITIVE — AB
Comment: NEGATIVE
Comment: NEGATIVE
Comment: NEGATIVE

## 2022-03-23 MED ORDER — FLUCONAZOLE 150 MG PO TABS: ORAL_TABLET | ORAL | 0 refills | Status: DC

## 2022-03-23 MED ORDER — METRONIDAZOLE 0.75 % EX GEL: 1.0000 | Freq: Two times a day (BID) | CUTANEOUS | 0 refills | Status: DC

## 2022-03-23 MED ORDER — SULFAMETHOXAZOLE-TRIMETHOPRIM 800-160 MG PO TABS: 1.0000 | ORAL_TABLET | Freq: Two times a day (BID) | ORAL | 0 refills | Status: DC

## 2022-03-23 NOTE — Telephone Encounter (Signed)
See pharmacy note

## 2022-03-23 NOTE — Telephone Encounter (Signed)
Patient called today wanting to know the results of her aptima swab. She states that she is itching a lot. Results are in, please advise. tbw

## 2022-03-24 MED ORDER — SEMAGLUTIDE (1 MG/DOSE) 4 MG/3ML ~~LOC~~ SOPN: 1.0000 mg | PEN_INJECTOR | SUBCUTANEOUS | 5 refills | Status: DC

## 2022-03-25 ENCOUNTER — Other Ambulatory Visit (HOSPITAL_BASED_OUTPATIENT_CLINIC_OR_DEPARTMENT_OTHER): Payer: Self-pay

## 2022-03-25 MED ORDER — METRONIDAZOLE 0.75 % VA GEL: 1.0000 | Freq: Every day | VAGINAL | 0 refills | Status: DC

## 2022-03-26 LAB — URINE CULTURE

## 2022-03-30 ENCOUNTER — Other Ambulatory Visit: Payer: Self-pay | Admitting: Family Medicine

## 2022-04-02 ENCOUNTER — Other Ambulatory Visit: Payer: Self-pay | Admitting: Urology

## 2022-04-02 ENCOUNTER — Other Ambulatory Visit: Payer: Self-pay | Admitting: Family Medicine

## 2022-04-08 ENCOUNTER — Other Ambulatory Visit: Payer: Self-pay | Admitting: Family Medicine

## 2022-04-08 DIAGNOSIS — R0602 Shortness of breath: Secondary | ICD-10-CM

## 2022-04-09 ENCOUNTER — Other Ambulatory Visit: Payer: Self-pay | Admitting: Family Medicine

## 2022-04-09 ENCOUNTER — Other Ambulatory Visit: Payer: Self-pay | Admitting: Nurse Practitioner

## 2022-04-09 DIAGNOSIS — J301 Allergic rhinitis due to pollen: Secondary | ICD-10-CM

## 2022-04-12 ENCOUNTER — Other Ambulatory Visit: Payer: Self-pay | Admitting: Family Medicine

## 2022-04-23 ENCOUNTER — Other Ambulatory Visit: Payer: Self-pay | Admitting: Gastroenterology

## 2022-04-23 ENCOUNTER — Other Ambulatory Visit: Payer: Self-pay | Admitting: Family Medicine

## 2022-04-27 ENCOUNTER — Encounter: Payer: Self-pay | Admitting: Family Medicine

## 2022-04-27 ENCOUNTER — Ambulatory Visit: Payer: Medicare PPO | Admitting: Family Medicine

## 2022-04-27 VITALS — BP 126/62 | HR 93 | Temp 98.1°F | Ht 72.0 in

## 2022-04-27 DIAGNOSIS — E782 Mixed hyperlipidemia: Secondary | ICD-10-CM | POA: Diagnosis not present

## 2022-04-27 DIAGNOSIS — Z23 Encounter for immunization: Secondary | ICD-10-CM

## 2022-04-27 DIAGNOSIS — F3342 Major depressive disorder, recurrent, in full remission: Secondary | ICD-10-CM | POA: Diagnosis not present

## 2022-04-27 DIAGNOSIS — I10 Essential (primary) hypertension: Secondary | ICD-10-CM | POA: Diagnosis not present

## 2022-04-27 DIAGNOSIS — E1165 Type 2 diabetes mellitus with hyperglycemia: Secondary | ICD-10-CM | POA: Diagnosis not present

## 2022-04-27 NOTE — Patient Instructions (Addendum)
Health Maintenance Due  Topic Date Due   Medicare Annual Wellness (AWV)  01/20/2019  You are eligible to schedule your annual wellness visit with our nurse specialist Otila Kluver.  Please consider scheduling this before you leave today  Prevnar 20 today before you leave  Please stop by lab before you go If you have mychart- we will send your results within 3 business days of [name] receiving them.  If you do not have mychart- we will call you about results within 5 business days of [name] receiving them.  *please also note that you will see labs on mychart as soon as they post. I will later go in and write notes on them- will say "notes from Dr. Yong Channel"    Recommended follow up: Return in about 4 months (around 08/27/2022) for followup or sooner if needed.Schedule b4 you leave.

## 2022-04-27 NOTE — Addendum Note (Signed)
Addended by: Clyde Lundborg A on: 04/27/2022 03:32 PM   Modules accepted: Orders

## 2022-04-27 NOTE — Progress Notes (Signed)
Phone [phone] In person visit   Subjective:   [name] is a 81 y.o. year old very pleasant female patient who presents for/with See problem oriented charting Chief Complaint  Patient presents with   Follow-up   Hypertension   Diabetes   left eye drainage    Past Medical History-  Patient Active Problem List   Diagnosis Date Noted   Chronic UTI 02/23/2021    Priority: High   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 11/22/2019    Priority: High   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    Priority: High   Bronchiectasis ([place]) 10/18/2015    Priority: High   Recurrent major depression in full remission ([place]) 01/03/2014    Priority: High   Poorly controlled diabetes mellitus ([place]) 12/28/2006    Priority: High   Adenomatous polyp 12/20/2018    Priority: Medium    Personal history of colonic polyps 05/18/2017    Priority: Medium    Aortic atherosclerosis ([place]) 02/16/2016    Priority: Medium    Solitary pulmonary nodule 01/08/2016    Priority: Medium    IBS (irritable bowel syndrome) 07/17/2014    Priority: Medium    Essential hypertension 01/03/2014    Priority: Medium    Primary open-angle glaucoma 08/02/2012    Priority: Medium    Fatty liver 03/18/2009    Priority: Medium    Hyperlipidemia 07/26/2007    Priority: Medium    ANEMIA, B12 DEFICIENCY 12/29/2006    Priority: Medium    RESTLESS LEG SYNDROME, MILD 12/28/2006    Priority: Medium    Osteoporosis 12/12/2006    Priority: Medium    History of total knee replacement, bilateral 11/14/2017    Priority: Low   Arthritis of midfoot 03/03/2016    Priority: Low   Hammertoes of both feet 03/03/2016    Priority: Low   Posterior tibial tendon dysfunction 01/03/2014    Priority: Low   Osteoarthritis, knee 01/03/2014    Priority: Low   Glaucoma 01/03/2014    Priority: Low   Obesity (BMI 30-39.9) 06/15/2013    Priority: Low   Foot tendinitis 07/20/2010    Priority: Low   INSOMNIA, CHRONIC  07/25/2009    Priority: Low   GERD 12/25/2007    Priority: Low   Idiopathic peripheral neuropathy 12/28/2006    Priority: Low   BREAST CANCER, HX OF 12/12/2006    Priority: Low   Chronic diastolic heart failure ([place]) 09/02/2021   Right lower quadrant abdominal pain 06/16/2021   Chronic respiratory failure with hypoxia ([place]) 04/29/2021   Fatigue 04/29/2021   Hospital discharge follow-up 04/21/2021   Perineal sinus 03/25/2021   Heart palpitations 02/11/2021   Ankle edema, bilateral 02/11/2021   Hoarseness 10/17/2020   Physical deconditioning 02/22/2019   Dyspnea on exertion 02/22/2019   Pain due to onychomycosis of toenails of both feet 11/08/2018    Medications- reviewed and updated Current Outpatient Medications  Medication Sig Dispense Refill   ACCU-CHEK GUIDE test strip USE 1 STRIP ONCE DAILY TO CHECK GLUCOSE 100 each 0   atenolol (TENORMIN) 25 MG tablet Take 1 tablet by mouth once daily 90 tablet 0   Bacillus Coagulans-Inulin (ALIGN PREBIOTIC-PROBIOTIC PO) Take 1 capsule by mouth daily.      BD PEN NEEDLE NANO 2ND GEN 32G X 4 MM MISC USE AS DIRECTED DAILY 100 each 0   cetirizine (ZYRTEC) 10 MG tablet Take 10 mg by mouth every morning.      Cholecalciferol (VITAMIN D3) 125 MCG (5000 UT)  TABS Take 5,000 Units by mouth daily.     Coenzyme Q10 (COQ10) 200 MG CAPS Take 200 mg by mouth daily.     desvenlafaxine (PRISTIQ) 50 MG 24 hr tablet Take 1 tablet (50 mg total) by mouth daily. 90 tablet 3   dicyclomine (BENTYL) 20 MG tablet TAKE 1 TABLET BY MOUTH 4 TIMES DAILY (  MORNING,  NOON,  EVENING,  AND  BEDTIME) 120 tablet 0   dorzolamide-timolol (COSOPT) 22.3-6.8 MG/ML ophthalmic solution Place 1 drop into both eyes 2 (two) times daily.      ESTRING 2 MG RING Place 2 mg vaginally every 3 (three) months. INSERT 1 RING  INTO VAGINA EVERY 3 MONTHS 1 each 4   FIBER PO Take 2 tablets by mouth every evening.     fluconazole (DIFLUCAN) 150 MG tablet Take one tablet every 72 hours for 3 doses  3 tablet 0   fluticasone (FLONASE) 50 MCG/ACT nasal spray Use 1 spray(s) in each nostril once daily 16 g 11   furosemide (LASIX) 20 MG tablet Take 1 tablet (20 mg total) by mouth daily as needed for fluid or edema. 30 tablet 0   gabapentin (NEURONTIN) 100 MG capsule TAKE 1 CAPSULE BY MOUTH THREE TIMES DAILY AS NEEDED FOR  NEUROPATHIC  PAIN 270 capsule 0   glimepiride (AMARYL) 4 MG tablet TAKE 2 TABLETS BY MOUTH ONCE DAILY WITH BREAKFAST 180 tablet 0   Lancets Misc. (ACCU-CHEK FASTCLIX LANCET) KIT Use to test blood sugars daily. Dx: E11.9 1 kit 5   latanoprost (XALATAN) 0.005 % ophthalmic solution Place 1 drop into the right eye at bedtime.      lovastatin (MEVACOR) 10 MG tablet Take 1 tablet by mouth once daily 90 tablet 0   methenamine (HIPREX) 1 g tablet Take 1 tablet (1 g total) by mouth 2 (two) times daily with a meal. 60 tablet 11   metroNIDAZOLE (METROGEL) 0.75 % vaginal gel Place 1 Applicatorful vaginally at bedtime. Use for 5 days 70 g 0   montelukast (SINGULAIR) 10 MG tablet TAKE 1 TABLET BY MOUTH AT BEDTIME 30 tablet 0   Multiple Vitamin (MULTIVITAMIN WITH MINERALS) TABS tablet Take 1 tablet by mouth daily. 3 Fruit Capsules 3 Vegetable Capsules - Balance of Nature 30 tablet 0   MYRBETRIQ 50 MG TB24 tablet Take 1 tablet by mouth once daily 30 tablet 0   pramipexole (MIRAPEX) 0.5 MG tablet TAKE 1 TABLET BY MOUTH AT BEDTIME 90 tablet 0   ROCKLATAN 0.02-0.005 % SOLN Place 1 drop into the left eye at bedtime.      Selenium 200 MCG CAPS Take 200 mcg by mouth at bedtime.      Semaglutide, 1 MG/DOSE, 4 MG/3ML SOPN Inject 1 mg as directed once a week. 3 mL 5   SYMBICORT 80-4.5 MCG/ACT inhaler Inhale 2 puffs by mouth twice daily 11 g 0   tamsulosin (FLOMAX) 0.4 MG CAPS capsule Take 1 capsule by mouth at bedtime 30 capsule 0   temazepam (RESTORIL) 15 MG capsule Take 1 capsule by mouth at bedtime 30 capsule 5   TRESIBA FLEXTOUCH 200 UNIT/ML FlexTouch Pen INJECT 80 UNITS SUBCUTANEOUSLY IN THE  MORNING 9 mL 0   valsartan (DIOVAN) 80 MG tablet Take 1 tablet (80 mg total) by mouth daily. 90 tablet 3   vitamin B-12 (CYANOCOBALAMIN) 1000 MCG tablet Take 1,000 mcg by mouth daily.     No current facility-administered medications for this visit.     Objective:  BP 126/62  Pulse 93   Temp 98.1 F (36.7 C)   Ht 6' (1.829 m)   LMP 05/10/1992 Comment: partial  SpO2 94%   BMI 34.45 kg/m  Gen: NAD, resting comfortably Redness in left eye (seeing her eye doctor on Thursday and has seen associate several times so far without much help) CV: RRR no murmurs rubs or gallops Lungs: CTAB no crackles, wheeze, rhonchi  Ext: no edema Skin: warm, dry     Assessment and Plan   # Diabetes S: Medication:Tresiba 84 units, Ozempic 1 mg, glimepiride 8 mg  CBGs- 100s and 120s Exercise and diet- doing some foot pedaling at night. Requested not to weigh today but has noted some weight loss at home Lab Results  Component Value Date   HGBA1C 9.1 (H) 12/31/2021   HGBA1C 7.8 (H) 07/02/2021   HGBA1C 8.4 (H) 03/09/2021   A/P: hopefully improved on ozempic- update a1c today. Continue current meds for now- 4th week today if a1c still high could consider going to 54m dose but I'm thrilled with morning sugars vs giving more time for weight loss  #hypertension S: medication: Valsartan 80 mg, atenolol 25 mg, Lasix 20 mg as needed for edema- not needing BP Readings from Last 3 Encounters:  04/27/22 126/62  01/29/22 130/66  01/27/22 130/63  A/P: stable- continue current medicines    #hyperlipidemia S: Medication:Lovastatin 10 mg along with co-Q10 Lab Results  Component Value Date   CHOL 111 12/31/2021   HDL 43.60 12/31/2021   LDLCALC 50 12/31/2021   LDLDIRECT 77.0 02/16/2016   TRIG 88.0 12/31/2021   CHOLHDL 3 12/31/2021   A/P: ideal control- continue current medications    # Depression S: Medication:Pristiq 50 mg, temazepam for sleep (no falls)    04/27/2022    2:23 PM 01/27/2022    3:18 PM  01/07/2022    3:06 PM  Depression screen PHQ 2/9  Decreased Interest 0 0 0  Down, Depressed, Hopeless 0 0 0  PHQ - 2 Score 0 0 0  Altered sleeping 0 0   Tired, decreased energy 0 0   Change in appetite 0 0   Feeling bad or failure about yourself  0 0   Trouble concentrating 0 0   Moving slowly or fidgety/restless 0 0   Suicidal thoughts 0 0   PHQ-9 Score 0 0   Difficult doing work/chores Not difficult at all Not difficult at all    A/P: continues to do well/full remisison- continue current meds  #UTIs-  wants to stay off methenamine for now- concerned about her liver- she may start later. Does see Dr. PClaudia Desanctisshe believes with urology- [facility]    Recommended follow up: Return in about 4 months (around 08/27/2022) for followup or sooner if needed.Schedule b4 you leave. Future Appointments  Date Time Provider [place] 07/30/2022  1:30 PM Parrett, TFonnie Mu NP [facility] None   Lab/Order associations:   ICD-10-CM   1. Poorly controlled diabetes mellitus ([place]  E11.65 Hemoglobin A1c    Comprehensive metabolic panel    2. Recurrent major depression in full remission ([place]  F33.42     3. Mixed hyperlipidemia  E78.2     4. Essential hypertension  I10      No orders of the defined types were placed in this encounter.  Return precautions advised.  SGarret Reddish MD

## 2022-04-28 LAB — COMPREHENSIVE METABOLIC PANEL
ALT: 13 U/L (ref 0–35)
AST: 16 U/L (ref 0–37)
Albumin: 3.8 g/dL (ref 3.5–5.2)
Alkaline Phosphatase: 73 U/L (ref 39–117)
BUN: 15 mg/dL (ref 6–23)
CO2: 31 mEq/L (ref 19–32)
Calcium: 9.5 mg/dL (ref 8.4–10.5)
Chloride: 101 mEq/L (ref 96–112)
Creatinine, Ser: 0.71 mg/dL (ref 0.40–1.20)
GFR: 79.25 mL/min (ref >60.00)
Glucose, Bld: 116 mg/dL — ABNORMAL HIGH (ref 70–99)
Potassium: 4.1 mEq/L (ref 3.5–5.1)
Sodium: 141 mEq/L (ref 135–145)
Total Bilirubin: 0.4 mg/dL (ref 0.2–1.2)
Total Protein: 6.4 g/dL (ref 6.0–8.3)

## 2022-04-28 LAB — HEMOGLOBIN A1C: Hgb A1c MFr Bld: 8.3 % — ABNORMAL HIGH (ref 4.6–6.5)

## 2022-04-29 DIAGNOSIS — H401123 Primary open-angle glaucoma, left eye, severe stage: Secondary | ICD-10-CM | POA: Diagnosis not present

## 2022-04-29 DIAGNOSIS — H16142 Punctate keratitis, left eye: Secondary | ICD-10-CM | POA: Diagnosis not present

## 2022-04-29 DIAGNOSIS — H401112 Primary open-angle glaucoma, right eye, moderate stage: Secondary | ICD-10-CM | POA: Diagnosis not present

## 2022-04-29 DIAGNOSIS — H04123 Dry eye syndrome of bilateral lacrimal glands: Secondary | ICD-10-CM | POA: Diagnosis not present

## 2022-04-30 ENCOUNTER — Other Ambulatory Visit: Payer: Self-pay | Admitting: Family Medicine

## 2022-04-30 ENCOUNTER — Other Ambulatory Visit: Payer: Self-pay | Admitting: Urology

## 2022-04-30 ENCOUNTER — Ambulatory Visit: Payer: Medicare PPO | Admitting: Family Medicine

## 2022-05-04 ENCOUNTER — Telehealth: Payer: Self-pay | Admitting: Family Medicine

## 2022-05-04 MED ORDER — DESVENLAFAXINE SUCCINATE ER 50 MG PO TB24: 50.0000 mg | ORAL_TABLET | Freq: Every day | ORAL | 3 refills | Status: DC

## 2022-05-04 NOTE — Telephone Encounter (Signed)
Look as if may have been denied in error, as of OV note on 04/27/22 pt was to continue this current med. Refill sent to pharmacy.

## 2022-05-04 NOTE — Telephone Encounter (Signed)
Pt states pharmacy informed her that her medication could not be continued & was denied by [facility]. Please advise.   MEDICATION:  desvenlafaxine (PRISTIQ) 50 MG 24 hr tablet   PHARMACY:  [facility] [address] [place] [address] Phone: [phone]  Fax: [phone]

## 2022-05-04 NOTE — Telephone Encounter (Signed)
Pharmacy states they did not receive RX, please resend.

## 2022-05-05 MED ORDER — TRESIBA FLEXTOUCH 200 UNIT/ML ~~LOC~~ SOPN: PEN_INJECTOR | SUBCUTANEOUS | 3 refills | Status: DC

## 2022-05-05 NOTE — Telephone Encounter (Signed)
Refill re-sent  

## 2022-05-06 ENCOUNTER — Ambulatory Visit (HOSPITAL_BASED_OUTPATIENT_CLINIC_OR_DEPARTMENT_OTHER): Payer: Medicare PPO | Admitting: Obstetrics & Gynecology

## 2022-05-06 DIAGNOSIS — L97512 Non-pressure chronic ulcer of other part of right foot with fat layer exposed: Secondary | ICD-10-CM | POA: Diagnosis not present

## 2022-05-07 ENCOUNTER — Other Ambulatory Visit: Payer: Self-pay | Admitting: Family Medicine

## 2022-05-07 ENCOUNTER — Other Ambulatory Visit: Payer: Self-pay | Admitting: Nurse Practitioner

## 2022-05-07 DIAGNOSIS — R0602 Shortness of breath: Secondary | ICD-10-CM

## 2022-05-07 DIAGNOSIS — J301 Allergic rhinitis due to pollen: Secondary | ICD-10-CM

## 2022-05-14 ENCOUNTER — Ambulatory Visit (INDEPENDENT_AMBULATORY_CARE_PROVIDER_SITE_OTHER): Payer: Medicare PPO

## 2022-05-14 VITALS — Wt 254.0 lb

## 2022-05-14 DIAGNOSIS — Z0000 Encounter for general adult medical examination without abnormal findings: Secondary | ICD-10-CM

## 2022-05-14 NOTE — Patient Instructions (Signed)
[name] , Thank you for taking time to come for your Medicare Wellness Visit. I appreciate your ongoing commitment to your health goals. Please review the following plan we discussed and let me know if I can assist you in the future.   These are the goals we discussed:  Goals       Mychart issues (pt-stated)      Care Coordination Interventions: Reviewed medications with patient and discussed purpose of all medications Reviewed scheduled/upcoming provider appointments including pending appointments. Also encouraged pt to contact her provider office for an AWV appointment. Screening for signs and symptoms of depression related to chronic disease state  Assessed social determinant of health barriers Pt having issues with access mychart however report her computer is not currently working and pt receptive to today's assessment to be mailed to the address on file.       Patient Stated      Will try to feel better and concentrate on self       Patient Stated      Be able to care for self         This is a list of the screening recommended for you and due dates:  Health Maintenance  Topic Date Due   Eye exam for diabetics  10/20/2021   COVID-19 Vaccine (4 - 2023-24 season) 01/08/2022   DEXA scan (bone density measurement)  07/16/2024*   Complete foot exam   10/10/2022   Hemoglobin A1C  10/27/2022   Yearly kidney health urinalysis for diabetes  01/01/2023   Yearly kidney function blood test for diabetes  04/28/2023   Medicare Annual Wellness Visit  05/15/2023   Pneumonia Vaccine  Completed   Flu Shot  Completed   Zoster (Shingles) Vaccine  Completed   HPV Vaccine  Aged Out   DTaP/Tdap/Td vaccine  Discontinued  *Topic was postponed. The date shown is not the original due date.    Advanced directives: Please bring a copy of your health care power of attorney and living will to the office at your convenience.  Conditions/risks identified: Be able to care for self   Next  appointment: Follow up in one year for your annual wellness visit    Preventive Care 65 Years and Older, Female Preventive care refers to lifestyle choices and visits with your health care provider that can promote health and wellness. What does preventive care include? A yearly physical exam. This is also called an annual well check. Dental exams once or twice a year. Routine eye exams. Ask your health care provider how often you should have your eyes checked. Personal lifestyle choices, including: Daily care of your teeth and gums. Regular physical activity. Eating a healthy diet. Avoiding tobacco and drug use. Limiting alcohol use. Practicing safe sex. Taking low-dose aspirin every day. Taking vitamin and mineral supplements as recommended by your health care provider. What happens during an annual well check? The services and screenings done by your health care provider during your annual well check will depend on your age, overall health, lifestyle risk factors, and family history of disease. Counseling  Your health care provider may ask you questions about your: Alcohol use. Tobacco use. Drug use. Emotional well-being. Home and relationship well-being. Sexual activity. Eating habits. History of falls. Memory and ability to understand (cognition). Work and work Statistician. Reproductive health. Screening  You may have the following tests or measurements: Height, weight, and BMI. Blood pressure. Lipid and cholesterol levels. These may be checked every 5 years,  or more frequently if you are over 31 years old. Skin check. Lung cancer screening. You may have this screening every year starting at age 66 if you have a 30-pack-year history of smoking and currently smoke or have quit within the past 15 years. Fecal occult blood test (FOBT) of the stool. You may have this test every year starting at age 18. Flexible sigmoidoscopy or colonoscopy. You may have a sigmoidoscopy every  5 years or a colonoscopy every 10 years starting at age 10. Hepatitis C blood test. Hepatitis B blood test. Sexually transmitted disease (STD) testing. Diabetes screening. This is done by checking your blood sugar (glucose) after you have not eaten for a while (fasting). You may have this done every 1-3 years. Bone density scan. This is done to screen for osteoporosis. You may have this done starting at age 33. Mammogram. This may be done every 1-2 years. Talk to your health care provider about how often you should have regular mammograms. Talk with your health care provider about your test results, treatment options, and if necessary, the need for more tests. Vaccines  Your health care provider may recommend certain vaccines, such as: Influenza vaccine. This is recommended every year. Tetanus, diphtheria, and acellular pertussis (Tdap, Td) vaccine. You may need a Td booster every 10 years. Zoster vaccine. You may need this after age 56. Pneumococcal 13-valent conjugate (PCV13) vaccine. One dose is recommended after age 51. Pneumococcal polysaccharide (PPSV23) vaccine. One dose is recommended after age 73. Talk to your health care provider about which screenings and vaccines you need and how often you need them. This information is not intended to replace advice given to you by your health care provider. Make sure you discuss any questions you have with your health care provider. Document Released: 05/23/2015 Document Revised: 01/14/2016 Document Reviewed: 02/25/2015 Elsevier Interactive Patient Education  2017 [place] Prevention in the Home Falls can cause injuries. They can happen to people of all ages. There are many things you can do to make your home safe and to help prevent falls. What can I do on the outside of my home? Regularly fix the edges of walkways and driveways and fix any cracks. Remove anything that might make you trip as you walk through a door, such as a raised  step or threshold. Trim any bushes or trees on the path to your home. Use bright outdoor lighting. Clear any walking paths of anything that might make someone trip, such as rocks or tools. Regularly check to see if handrails are loose or broken. Make sure that both sides of any steps have handrails. Any raised decks and porches should have guardrails on the edges. Have any leaves, snow, or ice cleared regularly. Use sand or salt on walking paths during winter. Clean up any spills in your garage right away. This includes oil or grease spills. What can I do in the bathroom? Use night lights. Install grab bars by the toilet and in the tub and shower. Do not use towel bars as grab bars. Use non-skid mats or decals in the tub or shower. If you need to sit down in the shower, use a plastic, non-slip stool. Keep the floor dry. Clean up any water that spills on the floor as soon as it happens. Remove soap buildup in the tub or shower regularly. Attach bath mats securely with double-sided non-slip rug tape. Do not have throw rugs and other things on the floor that can make you trip.  What can I do in the bedroom? Use night lights. Make sure that you have a light by your bed that is easy to reach. Do not use any sheets or blankets that are too big for your bed. They should not hang down onto the floor. Have a firm chair that has side arms. You can use this for support while you get dressed. Do not have throw rugs and other things on the floor that can make you trip. What can I do in the kitchen? Clean up any spills right away. Avoid walking on wet floors. Keep items that you use a lot in easy-to-reach places. If you need to reach something above you, use a strong step stool that has a grab bar. Keep electrical cords out of the way. Do not use floor polish or wax that makes floors slippery. If you must use wax, use non-skid floor wax. Do not have throw rugs and other things on the floor that can  make you trip. What can I do with my stairs? Do not leave any items on the stairs. Make sure that there are handrails on both sides of the stairs and use them. Fix handrails that are broken or loose. Make sure that handrails are as long as the stairways. Check any carpeting to make sure that it is firmly attached to the stairs. Fix any carpet that is loose or worn. Avoid having throw rugs at the top or bottom of the stairs. If you do have throw rugs, attach them to the floor with carpet tape. Make sure that you have a light switch at the top of the stairs and the bottom of the stairs. If you do not have them, ask someone to add them for you. What else can I do to help prevent falls? Wear shoes that: Do not have high heels. Have rubber bottoms. Are comfortable and fit you well. Are closed at the toe. Do not wear sandals. If you use a stepladder: Make sure that it is fully opened. Do not climb a closed stepladder. Make sure that both sides of the stepladder are locked into place. Ask someone to hold it for you, if possible. Clearly mark and make sure that you can see: Any grab bars or handrails. First and last steps. Where the edge of each step is. Use tools that help you move around (mobility aids) if they are needed. These include: Canes. Walkers. Scooters. Crutches. Turn on the lights when you go into a dark area. Replace any light bulbs as soon as they burn out. Set up your furniture so you have a clear path. Avoid moving your furniture around. If any of your floors are uneven, fix them. If there are any pets around you, be aware of where they are. Review your medicines with your doctor. Some medicines can make you feel dizzy. This can increase your chance of falling. Ask your doctor what other things that you can do to help prevent falls. This information is not intended to replace advice given to you by your health care provider. Make sure you discuss any questions you have with  your health care provider. Document Released: 02/20/2009 Document Revised: 10/02/2015 Document Reviewed: 05/31/2014 Elsevier Interactive Patient Education  2017 [employer].

## 2022-05-14 NOTE — Progress Notes (Signed)
I connected with  [name] on 05/14/22 by a audio enabled telemedicine application and verified that I am speaking with the correct person using two identifiers.  Patient Location: Home  Provider Location: Office/Clinic  I discussed the limitations of evaluation and management by telemedicine. The patient expressed understanding and agreed to proceed.  Subjective:   [name] is a 82 y.o. female who presents for Medicare Annual (Subsequent) preventive examination.  Review of Systems     Cardiac Risk Factors include: advanced age (>46mn, >>3women);hypertension;diabetes mellitus;dyslipidemia;obesity (BMI >30kg/m2);sedentary lifestyle     Objective:    Today's Vitals   05/14/22 1447  Weight: 254 lb (115.2 kg)   Body mass index is 34.45 kg/m.     05/14/2022    3:01 PM 10/15/2021   11:29 AM 09/20/2021    8:08 PM 07/20/2021   11:09 AM 03/21/2021    9:26 AM 03/09/2021    2:43 PM 03/07/2020   10:22 AM  Advanced Directives  Does Patient Have a Medical Advance Directive? Yes Yes No Yes No Yes Yes  Type of AParamedicof ANorwoodLiving will HCrowheartLiving will  HRiver BluffLiving will  H[place]afordLiving will HT[place]Living will  Copy of [place]in Chart? No - copy requested No - copy requested  No - copy requested   No - copy requested  Would patient like information on creating a medical advance directive?   No - Patient declined  No - Patient declined      Current Medications (verified) Outpatient Encounter Medications as of 05/14/2022  Medication Sig   ACCU-CHEK GUIDE test strip USE 1 STRIP ONCE DAILY TO CHECK GLUCOSE   atenolol (TENORMIN) 25 MG tablet Take 1 tablet by mouth once daily   Bacillus Coagulans-Inulin (ALIGN PREBIOTIC-PROBIOTIC PO) Take 1 capsule by mouth daily.    BD PEN NEEDLE NANO 2ND GEN 32G X 4 MM MISC USE AS DIRECTED DAILY    cetirizine (ZYRTEC) 10 MG tablet Take 10 mg by mouth every morning.    Cholecalciferol (VITAMIN D3) 125 MCG (5000 UT) TABS Take 5,000 Units by mouth daily.   Coenzyme Q10 (COQ10) 200 MG CAPS Take 200 mg by mouth daily.   desvenlafaxine (PRISTIQ) 50 MG 24 hr tablet Take 1 tablet (50 mg total) by mouth daily.   dicyclomine (BENTYL) 20 MG tablet TAKE 1 TABLET BY MOUTH 4 TIMES DAILY (  MORNING,  NOON,  EVENING,  AND  BEDTIME)   dorzolamide-timolol (COSOPT) 22.3-6.8 MG/ML ophthalmic solution Place 1 drop into both eyes 2 (two) times daily.    ESTRING 2 MG RING Place 2 mg vaginally every 3 (three) months. INSERT 1 RING  INTO VAGINA EVERY 3 MONTHS   FIBER PO Take 2 tablets by mouth every evening.   fluconazole (DIFLUCAN) 150 MG tablet Take one tablet every 72 hours for 3 doses   fluticasone (FLONASE) 50 MCG/ACT nasal spray Use 1 spray(s) in each nostril once daily   gabapentin (NEURONTIN) 100 MG capsule TAKE 1 CAPSULE BY MOUTH THREE TIMES DAILY AS NEEDED FOR  NEUROPATHIC  PAIN.   glimepiride (AMARYL) 4 MG tablet TAKE 2 TABLETS BY MOUTH ONCE DAILY WITH BREAKFAST   Lancets Misc. (ACCU-CHEK FASTCLIX LANCET) KIT Use to test blood sugars daily. Dx: E11.9   latanoprost (XALATAN) 0.005 % ophthalmic solution Place 1 drop into the right eye at bedtime.    lovastatin (MEVACOR) 10 MG tablet Take 1 tablet  by mouth once daily   montelukast (SINGULAIR) 10 MG tablet TAKE 1 TABLET BY MOUTH AT BEDTIME   Multiple Vitamin (MULTIVITAMIN WITH MINERALS) TABS tablet Take 1 tablet by mouth daily. 3 Fruit Capsules 3 Vegetable Capsules - Balance of Nature   MYRBETRIQ 50 MG TB24 tablet Take 1 tablet by mouth once daily   pramipexole (MIRAPEX) 0.5 MG tablet TAKE 1 TABLET BY MOUTH AT BEDTIME   Selenium 200 MCG CAPS Take 200 mcg by mouth at bedtime.    Semaglutide, 1 MG/DOSE, 4 MG/3ML SOPN Inject 1 mg as directed once a week.   SYMBICORT 80-4.5 MCG/ACT inhaler Inhale 2 puffs by mouth twice daily   tamsulosin (FLOMAX) 0.4 MG CAPS  capsule Take 1 capsule by mouth at bedtime   temazepam (RESTORIL) 15 MG capsule Take 1 capsule by mouth at bedtime   TRESIBA FLEXTOUCH 200 UNIT/ML FlexTouch Pen INJECT 80 UNITS SUBCUTANEOUSLY IN THE MORNING   valsartan (DIOVAN) 80 MG tablet Take 1 tablet (80 mg total) by mouth daily.   vitamin B-12 (CYANOCOBALAMIN) 1000 MCG tablet Take 1,000 mcg by mouth daily.   furosemide (LASIX) 20 MG tablet Take 1 tablet (20 mg total) by mouth daily as needed for fluid or edema. (Patient not taking: Reported on 05/14/2022)   methenamine (HIPREX) 1 g tablet Take 1 tablet (1 g total) by mouth 2 (two) times daily with a meal. (Patient not taking: Reported on 05/14/2022)   [DISCONTINUED] metroNIDAZOLE (METROGEL) 0.75 % vaginal gel Place 1 Applicatorful vaginally at bedtime. Use for 5 days   [DISCONTINUED] ROCKLATAN 0.02-0.005 % SOLN Place 1 drop into the left eye at bedtime.    No facility-administered encounter medications on file as of 05/14/2022.    Allergies (verified) Nitrofurantoin, Levaquin [levofloxacin in d5w], Brimonidine, Cephalexin, Erythromycin ethylsuccinate, and Oxycodone   History: Past Medical History:  Diagnosis Date   Allergy    Anemia    Anxiety    Arthritis    right knee;injections every 45month    Asthma    use daily   Breast cancer ([place] 1994/1995   HX BREAST CANCER/ right brreast and left breast in 1995   Bronchiectasis ([place]    COMPRESSION FRACTURE, LUMBAR VERTEBRAE 08/21/2008   Qualifier: Diagnosis of  By: JArnoldo MoraleMD, JBalinda Quails   Depression    Diabetes mellitus    takes Amaryl and Januvia daily   Diverticulitis    Dyspnea    with activity   Early cataracts, bilateral    Eczema    Endometriosis    Fatty liver    Gastritis    Glaucoma    Hammer toe    History of kidney stones    Hyperlipidemia associated with type 2 diabetes mellitus ([place] 07/26/2007   Diet/exercise control.     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;  Service: General;  Laterality: N/A;   BIOPSY  12/19/2018   Procedure: BIOPSY;  Surgeon: Mauri Pole, MD;  [facility];  Service: Endoscopy;;   CATARACT EXTRACTION     CHOLECYSTECTOMY  06/2008   COLONOSCOPY WITH PROPOFOL N/A 05/17/2017   Procedure: COLONOSCOPY WITH PROPOFOL;  Surgeon: Mauri Pole, MD;  [facility]: [facility];  Service: Endoscopy;  Laterality: N/A;  PT WILL BE ADMITTED THE DAY BEFORE FOR PREP PER ROBIN KB   COLONOSCOPY WITH PROPOFOL N/A 12/19/2018   Procedure: COLONOSCOPY WITH PROPOFOL;  Surgeon: Mauri Pole, MD;  [facility];  Service: Endoscopy;  Laterality: N/A;   DILATION AND CURETTAGE OF UTERUS     excision on breast     internal infected suture from breast surgery   excision removed from neck  2008   infected lymph node   EXTRACORPOREAL SHOCK WAVE LITHOTRIPSY Right 07/20/2021   Procedure: EXTRACORPOREAL SHOCK WAVE LITHOTRIPSY (ESWL);  Surgeon: Ardis Hughs, MD;  [facility];  Service: Urology;   Laterality: Right;   EXTRACORPOREAL SHOCK WAVE LITHOTRIPSY Right 10/15/2021   Procedure: EXTRACORPOREAL SHOCK WAVE LITHOTRIPSY (ESWL);  Surgeon: Robley Fries, MD;  [facility];  Service: Urology;  Laterality: Right;   HEMORRHOID SURGERY N/A 11/22/2019   Procedure: HEMORRHOIDECTOMY LIGATION , PEXY;  Surgeon: Michael Boston, MD;  Location: [place];  Service: General;  Laterality: N/A;   HYPERBARIC OXYGEN THERAPY     FEET INFECTION   HYSTEROSCOPY  06/22/2011   PMB submucosal myoma   JOINT REPLACEMENT  2014   left total shoulder   LAPAROSCOPIC OOPHORECTOMY Right 12/2004   absent LSO   LAPAROTOMY  1977   LIGATION OF INTERNAL FISTULA TRACT N/A 11/22/2019   Procedure: REPAIR OF PERIRECTAL FISTULA, ANORECTAL EXAMINATION UNDER ANESTHESIA;  Surgeon: Michael Boston, MD;  Location: [place];  Service: General;  Laterality: N/A;   MASTECTOMY Bilateral 1994 right, 1995 left   PLACEMENT OF SETON N/A 03/19/2021   Procedure: LIMBERG RHOMBOID ROTATIONAL FLAP CLOSURE;  Surgeon: Michael Boston, MD;  [facility];  Service: General;  Laterality: N/A;   POLYPECTOMY  12/19/2018   Procedure: POLYPECTOMY;  Surgeon: Mauri Pole, MD;  [facility];  Service: Endoscopy;;   RECTAL EXAM UNDER ANESTHESIA N/A 03/07/2020   Procedure: ANORECTAL EXAM UNDER ANESTHESIA;  Surgeon: Michael Boston, MD;  Location: [place];  Service: General;  Laterality: N/A;   RECTAL EXAM UNDER ANESTHESIA N/A 03/19/2021   Procedure: RECTAL EXAM UNDER ANESTHESIA;  Surgeon: Michael Boston, MD;  Location[facility];  Service: General;  Laterality: N/A;   SHOULDER HEMI-ARTHROPLASTY  01/01/2012   Procedure: SHOULDER HEMI-ARTHROPLASTY;  Surgeon: Roseanne Kaufman, MD;  Location: [place];  Service: Orthopedics;  Laterality: Left;  Left Shoulder Hemi Arthroplasty with Repair and Reconstruction as Necessary    THYROIDECTOMY  1975   24yr ago. follows endocrine    TONSILLECTOMY     as a child  TOTAL KNEE ARTHROPLASTY Right 12/30/2014   Procedure: RIGHT TOTAL KNEE ARTHROPLASTY;  Surgeon: Gaynelle Arabian, MD;  [facility];  Service: Orthopedics;  Laterality: Right;   TOTAL KNEE ARTHROPLASTY Left 11/29/2016   Procedure: LEFT TOTAL KNEE ARTHROPLASTY;  Surgeon: Gaynelle Arabian, MD;  [facility];  Service: Orthopedics;  Laterality: Left;   Family History  Problem Relation Age of Onset   Arthritis Mother    COPD Father    Heart disease Father        MI 8 - states he died of lockjaw   Hypertension Father    Hyperlipidemia Father    Pancreatic cancer Paternal Grandfather    Colon cancer Neg Hx    Esophageal cancer Neg Hx    Rectal cancer Neg Hx    Stomach cancer Neg Hx    Social History   Socioeconomic History   Marital status: Married    Spouse name: Clair Gulling   Number of children: 1   Years of education: Not on file   Highest education level: Not on file  Occupational History   Occupation: retired  Tobacco Use   Smoking status: Never   Smokeless tobacco: Never  Vaping Use   Vaping Use: Never used  Substance and Sexual Activity   Alcohol use: No   Drug use: No   Sexual activity: Not Currently    Partners: Male    Birth control/protection: Post-menopausal, Surgical    Comment: partial hysterectomy  Other Topics Concern   Not on file  Social History Narrative   Married 39 years in 2015. 1 adopted son. 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, [employer], sewing, decorate   Social Determinants of Health   Financial Resource Strain: [place]  (05/14/2022)   Overall Financial Resource Strain (CARDIA)    Difficulty of Paying Living Expenses: Not hard at all  Food Insecurity: No Food Insecurity (05/14/2022)   Hunger Vital Sign    Worried About Running Out of Food in the Last Year: Never true    [place] in the Last Year: Never true  Transportation Needs: No Transportation Needs  (05/14/2022)   PRAPARE - Hydrologist (Medical): No    Lack of Transportation (Non-Medical): No  Physical Activity: Insufficiently Active (05/14/2022)   Exercise Vital Sign    Days of Exercise per Week: 5 days    Minutes of Exercise per Session: 10 min  Stress: No Stress Concern Present (05/14/2022)   [place]    Feeling of Stress : Only a little  Social Connections: Moderately Integrated (05/14/2022)   Social Connection and Isolation Panel [NHANES]    Frequency of Communication with Friends and Family: Three times a week    Frequency of Social Gatherings with Friends and Family: Once a week    Attends Religious Services: 1 to 4 times per year    Active Member of [employer] or Organizations: No    Attends Music therapist: Never    Marital Status: Married    Tobacco Counseling Counseling given: Not Answered   Clinical Intake:  Pre-visit preparation completed: Yes  Pain : No/denies pain     BMI - recorded: 34.45 Nutritional Status: BMI > 30  Obese Nutritional Risks: None Diabetes: Yes CBG done?: Yes (134 per pt) CBG resulted in Enter/ Edit results?: No Did pt. bring in  CBG monitor from home?: No  How often do you need to have someone help you when you read instructions, pamphlets, or other written materials from your doctor or pharmacy?: 1 - Never  Diabetic?Nutrition Risk Assessment:  Has the patient had any N/V/D within the last 2 months?  No  Does the patient have any non-healing wounds?  No  Has the patient had any unintentional weight loss or weight gain?  No   Diabetes:  Is the patient diabetic?  Yes  If diabetic, was a CBG obtained today?  Yes  Did the patient bring in their glucometer from home?  No  How often do you monitor your CBG's? Daily .   Financial Strains and Diabetes Management:  Are you having any financial strains with the device, your supplies or  your medication? No .  Does the patient want to be seen by Chronic Care Management for management of their diabetes?  No  Would the patient like to be referred to a Nutritionist or for Diabetic Management?  No   Diabetic Exams:  Diabetic Eye Exam: Completed 12/23 per pt  Diabetic Foot Exam: Completed 10/09/21   Interpreter Needed?: No  Information entered by :: Charlott Rakes, LPN   Activities of Daily Living    05/14/2022    3:02 PM 10/15/2021   11:33 AM  In your present state of health, do you have any difficulty performing the following activities:  Hearing? 0 0  Vision? 0 0  Difficulty concentrating or making decisions? 0 0  Walking or climbing stairs? 0 1  Dressing or bathing? 0 1  Doing errands, shopping? 0   Preparing Food and eating ? N   Using the Toilet? N   In the past six months, have you accidently leaked urine? Y   Comment wears a brief   Do you have problems with loss of bowel control? N   Managing your Medications? N   Managing your Finances? N   Housekeeping or managing your Housekeeping? N     Patient Care Team: Marin Olp, MD as PCP - General (Family Medicine) Michael Boston, MD as Consulting Physician (General Surgery) Mauri Pole, MD as Consulting Physician (Gastroenterology) Jerline Pain, MD as Consulting Physician (Cardiology) Gaynelle Arabian, MD as Consulting Physician (Orthopedic Surgery) Jaquita Folds, MD as Consulting Physician (Gynecology)  Indicate any recent Medical Services you may have received from other than [facility] providers in the past year (date may be approximate).     Assessment:   This is a routine wellness examination for [facility].  Hearing/Vision screen Hearing Screening - Comments:: Pt denies any hearing issues  Vision Screening - Comments:: Pt follows up with [facility] for annual eye exams   Dietary issues and exercise activities discussed: Current Exercise Habits: Home exercise routine, Type of  exercise: Other - see comments (Qbee), Time (Minutes): 10, Frequency (Times/Week): 5, Weekly Exercise (Minutes/Week): 50   Goals Addressed             This Visit's Progress    Patient Stated       Be able to care for self        Depression Screen    05/14/2022    2:59 PM 04/27/2022    2:23 PM 01/27/2022    3:18 PM 01/07/2022    3:06 PM 12/31/2021   12:54 PM 12/24/2021    1:50 PM 10/09/2021    2:02 PM  PHQ 2/9 Scores  PHQ - 2 Score 0 0 0  0 0 0 0  PHQ- 9 Score 0 0 0    0    Fall Risk    05/14/2022    3:02 PM 12/31/2021   12:54 PM 03/04/2021    9:34 AM 12/18/2020    1:29 PM 08/11/2020    1:27 PM  [place] in the past year? 0 0 0 0 0  Number falls in past yr: 0 0 0 0 0  Injury with Fall? 0 0 0 0 0  Risk for fall due to : Impaired vision;Impaired mobility;Impaired balance/gait No Fall Risks  History of fall(s)   Follow up Falls prevention discussed Falls evaluation completed Falls evaluation completed Falls evaluation completed     FALL RISK PREVENTION PERTAINING TO THE HOME:  Any stairs in or around the home? Yes  If so, are there any without handrails? No  Home free of loose throw rugs in walkways, pet beds, electrical cords, etc? Yes  Adequate lighting in your home to reduce risk of falls? Yes   ASSISTIVE DEVICES UTILIZED TO PREVENT FALLS:  Life alert? No  Use of a cane, walker or w/c? Yes  Grab bars in the bathroom? No  Shower chair or bench in shower? Yes  Elevated toilet seat or a handicapped toilet? Yes   TIMED UP AND GO:  Was the test performed? No .   Cognitive Function:        05/14/2022    3:06 PM  6CIT Screen  What Year? 0 points  What month? 0 points  What time? 0 points  Count back from 20 0 points  Months in reverse 0 points  Repeat phrase 0 points  Total Score 0 points    Immunizations Immunization History  Administered Date(s) Administered   Fluad Quad(high Dose 65+) 02/22/2019, 02/20/2020, 02/18/2021, 01/27/2022   H1N1  06/05/2008   Influenza Split 02/07/2012   Influenza Whole 03/10/2000, 01/24/2008, 03/10/2009, 01/20/2010   Influenza, High Dose Seasonal PF 02/12/2013, 01/31/2017, 01/19/2018   Influenza,inj,Quad PF,6+ Mos 01/17/2014, 03/27/2015   Influenza,inj,quad, With Preservative 02/07/2017   Influenza-Unspecified 02/09/2016   PFIZER(Purple Top)SARS-COV-2 Vaccination 06/03/2019, 06/25/2019, 04/24/2020   PNEUMOCOCCAL CONJUGATE-20 04/27/2022   Pneumococcal Conjugate-13 01/17/2014   Pneumococcal Polysaccharide-23 03/10/2000, 05/02/2007, 01/02/2012   Respiratory Syncytial Virus Vaccine,Recomb Aduvanted(Arexvy) 03/08/2022   Td 05/10/1992, 12/25/2007   Zoster Recombinat (Shingrix) 02/24/2018, 04/26/2018      Flu Vaccine status: Up to date  Pneumococcal vaccine status: Up to date  Covid-19 vaccine status: Completed vaccines  Qualifies for Shingles Vaccine? Yes   Zostavax completed Yes   Shingrix Completed?: Yes  Screening Tests Health Maintenance  Topic Date Due   OPHTHALMOLOGY EXAM  10/20/2021   COVID-19 Vaccine (4 - 2023-24 season) 01/08/2022   DEXA SCAN  07/16/2024 (Originally 01/11/2005)   FOOT EXAM  10/10/2022   HEMOGLOBIN A1C  10/27/2022   Diabetic kidney evaluation - Urine ACR  01/01/2023   Diabetic kidney evaluation - eGFR measurement  04/28/2023   Medicare Annual Wellness (AWV)  05/15/2023   Pneumonia Vaccine 21+ Years old  Completed   INFLUENZA VACCINE  Completed   Zoster Vaccines- Shingrix  Completed   HPV VACCINES  Aged Out   DTaP/Tdap/Td  Discontinued    Health Maintenance  Health Maintenance Due  Topic Date Due   OPHTHALMOLOGY EXAM  10/20/2021   COVID-19 Vaccine (4 - 2023-24 season) 01/08/2022    Colorectal cancer screening: No longer required.   Mammogram status: No longer required due to double mastectomy .  Additional Screening:  Vision Screening: Recommended annual ophthalmology exams for early detection of glaucoma and other disorders of the eye. Is the  patient up to date with their annual eye exam?  Yes  Who is the provider or what is the name of the office in which the patient attends annual eye exams? [facility]  If pt is not established with a provider, would they like to be referred to a provider to establish care? No .   Dental Screening: Recommended annual dental exams for proper oral hygiene  Community Resource Referral / Chronic Care Management: CRR required this visit?  No   CCM required this visit?  No      Plan:     I have personally reviewed and noted the following in the patient's chart:   Medical and social history Use of alcohol, tobacco or illicit drugs  Current medications and supplements including opioid prescriptions. Patient is not currently taking opioid prescriptions. Functional ability and status Nutritional status Physical activity Advanced directives List of other physicians Hospitalizations, surgeries, and ER visits in previous 12 months Vitals Screenings to include cognitive, depression, and falls Referrals and appointments  In addition, I have reviewed and discussed with patient certain preventive protocols, quality metrics, and best practice recommendations. A written personalized care plan for preventive services as well as general preventive health recommendations were provided to patient.     Willette Brace, LPN   0/10/3014   Nurse Notes: none

## 2022-05-15 ENCOUNTER — Other Ambulatory Visit: Payer: Self-pay | Admitting: Family Medicine

## 2022-05-21 ENCOUNTER — Other Ambulatory Visit: Payer: Self-pay | Admitting: Gastroenterology

## 2022-06-03 ENCOUNTER — Other Ambulatory Visit (HOSPITAL_BASED_OUTPATIENT_CLINIC_OR_DEPARTMENT_OTHER): Payer: Self-pay | Admitting: *Deleted

## 2022-06-03 DIAGNOSIS — N952 Postmenopausal atrophic vaginitis: Secondary | ICD-10-CM

## 2022-06-03 MED ORDER — ESTRING 2 MG VA RING: 2.0000 mg | VAGINAL_RING | VAGINAL | 0 refills | Status: DC

## 2022-06-03 NOTE — Progress Notes (Signed)
Pts husband presents to office to request refill on wifes estring. He states that she is out. Rx sent to pharmacy

## 2022-06-04 ENCOUNTER — Other Ambulatory Visit: Payer: Self-pay | Admitting: Family Medicine

## 2022-06-04 ENCOUNTER — Other Ambulatory Visit: Payer: Self-pay | Admitting: Urology

## 2022-06-04 DIAGNOSIS — R0602 Shortness of breath: Secondary | ICD-10-CM

## 2022-06-11 ENCOUNTER — Other Ambulatory Visit: Payer: Self-pay | Admitting: Nurse Practitioner

## 2022-06-11 ENCOUNTER — Other Ambulatory Visit: Payer: Self-pay | Admitting: Urology

## 2022-06-11 DIAGNOSIS — J301 Allergic rhinitis due to pollen: Secondary | ICD-10-CM

## 2022-06-18 ENCOUNTER — Other Ambulatory Visit: Payer: Self-pay | Admitting: Family Medicine

## 2022-06-23 ENCOUNTER — Ambulatory Visit (INDEPENDENT_AMBULATORY_CARE_PROVIDER_SITE_OTHER): Payer: Medicare PPO

## 2022-06-23 ENCOUNTER — Other Ambulatory Visit (HOSPITAL_COMMUNITY)
Admission: RE | Admit: 2022-06-23 | Discharge: 2022-06-23 | Disposition: A | Discharge status: Home / Self Care | Payer: Medicare PPO | Source: Ambulatory Visit | Attending: Obstetrics & Gynecology | Admitting: Obstetrics & Gynecology

## 2022-06-23 DIAGNOSIS — N3000 Acute cystitis without hematuria: Secondary | ICD-10-CM

## 2022-06-23 DIAGNOSIS — H401123 Primary open-angle glaucoma, left eye, severe stage: Secondary | ICD-10-CM | POA: Diagnosis not present

## 2022-06-23 DIAGNOSIS — N898 Other specified noninflammatory disorders of vagina: Secondary | ICD-10-CM

## 2022-06-23 DIAGNOSIS — R350 Frequency of micturition: Secondary | ICD-10-CM

## 2022-06-23 DIAGNOSIS — H401112 Primary open-angle glaucoma, right eye, moderate stage: Secondary | ICD-10-CM | POA: Diagnosis not present

## 2022-06-23 LAB — POCT URINALYSIS DIPSTICK
Bilirubin, UA: NEGATIVE
Blood, UA: NEGATIVE
Glucose, UA: NEGATIVE
Ketones, UA: NEGATIVE
Leukocytes, UA: NEGATIVE
Nitrite, UA: POSITIVE
Protein, UA: POSITIVE — AB
Spec Grav, UA: >=1.03 — AB (ref 1.010–1.025)
Urobilinogen, UA: 0.2 E.U./dL
pH, UA: 5.5 (ref 5.0–8.0)

## 2022-06-23 LAB — HM DIABETES EYE EXAM

## 2022-06-23 NOTE — Progress Notes (Signed)
Patient came in today with complaints of urinary frequency and dysuria. Patient is also complaining of a little vaginal discharge and foul odor. tbw

## 2022-06-24 LAB — CERVICOVAGINAL ANCILLARY ONLY
Bacterial Vaginitis (gardnerella): POSITIVE — AB
Candida Glabrata: NEGATIVE
Candida Vaginitis: POSITIVE — AB
Comment: NEGATIVE
Comment: NEGATIVE
Comment: NEGATIVE

## 2022-06-28 ENCOUNTER — Other Ambulatory Visit (HOSPITAL_BASED_OUTPATIENT_CLINIC_OR_DEPARTMENT_OTHER): Payer: Self-pay | Admitting: *Deleted

## 2022-06-28 MED ORDER — FLUCONAZOLE 150 MG PO TABS: 150.0000 mg | ORAL_TABLET | Freq: Once | ORAL | 0 refills | Status: AC

## 2022-06-28 MED ORDER — METRONIDAZOLE 0.75 % VA GEL: 1.0000 | Freq: Every day | VAGINAL | 0 refills | Status: DC

## 2022-06-29 LAB — URINE CULTURE

## 2022-06-30 ENCOUNTER — Other Ambulatory Visit: Payer: Self-pay | Admitting: Family Medicine

## 2022-06-30 DIAGNOSIS — R0602 Shortness of breath: Secondary | ICD-10-CM

## 2022-06-30 MED ORDER — FLUCONAZOLE 150 MG PO TABS: 150.0000 mg | ORAL_TABLET | Freq: Once | ORAL | 0 refills | Status: AC

## 2022-06-30 MED ORDER — AMOXICILLIN-POT CLAVULANATE 500-125 MG PO TABS: 1.0000 | ORAL_TABLET | Freq: Two times a day (BID) | ORAL | 0 refills | Status: DC

## 2022-06-30 NOTE — Addendum Note (Signed)
Addended by: Megan Salon on: 06/30/2022 02:17 AM   Modules accepted: Orders

## 2022-07-02 ENCOUNTER — Other Ambulatory Visit: Payer: Self-pay | Admitting: Gastroenterology

## 2022-07-02 ENCOUNTER — Other Ambulatory Visit: Payer: Self-pay | Admitting: Urology

## 2022-07-02 ENCOUNTER — Other Ambulatory Visit: Payer: Self-pay | Admitting: Nurse Practitioner

## 2022-07-02 DIAGNOSIS — J301 Allergic rhinitis due to pollen: Secondary | ICD-10-CM

## 2022-07-15 ENCOUNTER — Other Ambulatory Visit (HOSPITAL_COMMUNITY)
Admission: RE | Admit: 2022-07-15 | Discharge: 2022-07-15 | Disposition: A | Discharge status: Home / Self Care | Payer: Medicare PPO | Source: Ambulatory Visit | Attending: Obstetrics & Gynecology | Admitting: Obstetrics & Gynecology

## 2022-07-15 ENCOUNTER — Ambulatory Visit (HOSPITAL_BASED_OUTPATIENT_CLINIC_OR_DEPARTMENT_OTHER): Payer: Medicare PPO | Admitting: Obstetrics & Gynecology

## 2022-07-15 ENCOUNTER — Encounter (HOSPITAL_BASED_OUTPATIENT_CLINIC_OR_DEPARTMENT_OTHER): Payer: Self-pay | Admitting: Obstetrics & Gynecology

## 2022-07-15 VITALS — BP 116/56 | HR 90 | Wt 257.0 lb

## 2022-07-15 DIAGNOSIS — N952 Postmenopausal atrophic vaginitis: Secondary | ICD-10-CM | POA: Diagnosis not present

## 2022-07-15 DIAGNOSIS — R3915 Urgency of urination: Secondary | ICD-10-CM | POA: Diagnosis not present

## 2022-07-15 DIAGNOSIS — N898 Other specified noninflammatory disorders of vagina: Secondary | ICD-10-CM | POA: Insufficient documentation

## 2022-07-16 ENCOUNTER — Other Ambulatory Visit: Payer: Self-pay | Admitting: Family Medicine

## 2022-07-16 LAB — CERVICOVAGINAL ANCILLARY ONLY
Bacterial Vaginitis (gardnerella): POSITIVE — AB
Candida Glabrata: POSITIVE — AB
Candida Vaginitis: NEGATIVE
Comment: NEGATIVE
Comment: NEGATIVE
Comment: NEGATIVE

## 2022-07-18 NOTE — Progress Notes (Signed)
GYNECOLOGY  VISIT  CC:   estring removal and replacement  HPI: 82 y.o. G0P0 Married White or Caucasian female here for estring removal and replacement.  Also would like testing for vaginitis.  Having some discharge as well as urinary urgency.  Does not think she can leave a sample but would like to bring one back.  H/o explosive diarrhea at times so concerned about UTI.  No fevers.  No blood in urine.  No vaginal bleeding.   Past Medical History:  Diagnosis Date   Allergy    Anemia    Anxiety    Arthritis    right knee;injections every 77month    Asthma    use daily   [place]   HX BREAST CANCER/ right brreast and left breast in 1995   [place]    COMPRESSION FRACTURE, LUMBAR VERTEBRAE 08/21/2008   Qualifier: Diagnosis of  By: JArnoldo MoraleMD, JBalinda Quails   Depression    Diabetes mellitus    takes Amaryl and Januvia daily   Diverticulitis    Dyspnea    with activity   Early cataracts, bilateral    Eczema    Endometriosis    Fatty liver    Gastritis    Glaucoma    Hammer toe    History of kidney stones    Hyperlipidemia associated with type 2 diabetes mellitus ([facility] 07/26/2007   Diet/exercise control.     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      BD PEN NEEDLE NANO 2ND GEN 32G X 4 MM MISC USE AS DIRECTED DAILY 100 each 0   cetirizine (ZYRTEC) 10 MG tablet Take 10 mg by mouth every morning.      Cholecalciferol (VITAMIN D3) 125 MCG (5000 UT) TABS Take 5,000 Units by mouth daily.     Coenzyme Q10 (COQ10) 200 MG CAPS Take 200 mg by mouth daily.     desvenlafaxine (PRISTIQ) 50 MG 24 hr tablet Take 1 tablet (50 mg total) by mouth daily. 90 tablet 3   dicyclomine (BENTYL) 20 MG tablet TAKE 1 TABLET BY MOUTH 4 TIMES DAILY (MORNING,  NOON,  EVENING,  AND  BEDTIME) 120 tablet 0   dorzolamide-timolol (COSOPT) 22.3-6.8 MG/ML ophthalmic solution Place 1 drop into both eyes 2 (two) times daily.      ESTRING 2 MG vaginal ring Place 2 mg vaginally every 3 (three) months. INSERT 1 RING  INTO VAGINA EVERY 3 MONTHS 1 each 0   FIBER PO Take 2 tablets by mouth every evening.     fluticasone (FLONASE) 50 MCG/ACT nasal spray Use 1 spray(s) in each nostril once daily 16 g 11   furosemide (LASIX) 20 MG tablet Take 1 tablet (20 mg total) by mouth daily as needed for fluid or edema. 30 tablet 0   gabapentin (NEURONTIN) 100 MG capsule TAKE 1 CAPSULE BY MOUTH THREE TIMES DAILY AS NEEDED FOR  NEUROPATHIC  PAIN. 270 capsule 0   glimepiride (AMARYL) 4 MG tablet TAKE 2 TABLETS BY MOUTH ONCE DAILY WITH BREAKFAST 180 tablet 0   Lancets Misc. (ACCU-CHEK FASTCLIX LANCET) KIT Use to test blood sugars daily. Dx: E11.9 1 kit 5   latanoprost (XALATAN) 0.005 % ophthalmic solution Place 1 drop into the right eye at bedtime.      lovastatin (MEVACOR) 10 MG tablet Take 1 tablet by mouth once daily 90 tablet 0    methenamine (HIPREX) 1 g tablet Take 1 tablet (1 g total) by mouth 2 (two) times daily with a meal. 60 tablet 11   metroNIDAZOLE (METROGEL) 0.75 % vaginal gel Place 1 Applicatorful vaginally at bedtime. Use for 5 nights. 70 g 0   montelukast (SINGULAIR) 10 MG tablet TAKE 1 TABLET BY MOUTH AT BEDTIME 30 tablet 0   Multiple Vitamin (MULTIVITAMIN WITH MINERALS) TABS tablet Take 1 tablet by mouth daily. 3 Fruit Capsules 3 Vegetable Capsules - Balance of Nature 30 tablet 0   MYRBETRIQ 50 MG TB24 tablet Take 1 tablet by mouth once daily 30 tablet 0   pramipexole (MIRAPEX) 0.5 MG tablet TAKE 1 TABLET BY MOUTH AT BEDTIME 90 tablet 0   Selenium 200 MCG CAPS Take 200 mcg by mouth at bedtime.      Semaglutide, 1 MG/DOSE, 4 MG/3ML SOPN Inject 1 mg as directed once a week. 3 mL 5   SYMBICORT 80-4.5 MCG/ACT inhaler Inhale 2 puffs by mouth twice daily 11 g 0   tamsulosin (FLOMAX) 0.4 MG CAPS capsule Take 1 capsule by mouth at bedtime 30 capsule 0   temazepam (RESTORIL) 15 MG capsule Take 1 capsule by mouth at bedtime 30 capsule 5   TRESIBA FLEXTOUCH 200 UNIT/ML FlexTouch Pen INJECT 80 UNITS SUBCUTANEOUSLY IN THE MORNING 9 mL 3   valsartan (DIOVAN) 80 MG tablet Take 1 tablet by mouth once daily 90 tablet 0   vitamin B-12 (CYANOCOBALAMIN) 1000 MCG tablet Take 1,000 mcg by mouth daily.     No current facility-administered medications on file prior to visit.    ALLERGIES: Nitrofurantoin, Levaquin [levofloxacin in d5w], Brimonidine, Cephalexin, Erythromycin ethylsuccinate, and Oxycodone  SH:  married, non smoker  Review of Systems  Constitutional: Negative.   Genitourinary:  Positive for urgency. Negative for dysuria.    PHYSICAL EXAMINATION:    BP (!) 116/56 (BP Location: Right Arm, Patient Position: Sitting, Cuff Size: Large)   Pulse 90   Wt 257 lb (116.6 kg) Comment: reported  LMP 05/10/1992 Comment: partial  BMI 34.86 kg/m     General appearance: alert, cooperative and appears stated age Lymph:   no inguinal LAD noted  Pelvic: External genitalia:  no lesions  Urethra:  normal appearing urethra with no masses, tenderness or lesions              Bartholins and Skenes: normal                 Vagina: normal appearing vagina with normal color and discharge, no lesions              Cervix: no lesions              Bimanual Exam:  Uterus:  normal size, contour, position, consistency, mobility, non-tender              Adnexa: no mass, fullness, tenderness            Chaperone, Octaviano Batty, CMA, was present for exam.  Assessment/Plan: 1. Vaginal discharge - will test for yeast and BV - Cervicovaginal ancillary only( [place])  2. Vaginal atrophy - continue with estring use, changing every 3 months  3. Urinary urgency - Urine Culture; Future  - pt or spouse will bring in new urine sample

## 2022-07-23 ENCOUNTER — Ambulatory Visit (HOSPITAL_BASED_OUTPATIENT_CLINIC_OR_DEPARTMENT_OTHER): Payer: Medicare PPO

## 2022-07-23 ENCOUNTER — Other Ambulatory Visit (HOSPITAL_BASED_OUTPATIENT_CLINIC_OR_DEPARTMENT_OTHER): Payer: Self-pay | Admitting: *Deleted

## 2022-07-23 ENCOUNTER — Other Ambulatory Visit: Payer: Self-pay | Admitting: Urology

## 2022-07-23 DIAGNOSIS — R3915 Urgency of urination: Secondary | ICD-10-CM

## 2022-07-23 MED ORDER — FLUCONAZOLE 150 MG PO TABS: 150.0000 mg | ORAL_TABLET | Freq: Once | ORAL | 0 refills | Status: AC

## 2022-07-23 MED ORDER — METRONIDAZOLE 500 MG PO TABS: 500.0000 mg | ORAL_TABLET | Freq: Two times a day (BID) | ORAL | 0 refills | Status: DC

## 2022-07-27 LAB — URINE CULTURE

## 2022-07-28 ENCOUNTER — Other Ambulatory Visit (HOSPITAL_BASED_OUTPATIENT_CLINIC_OR_DEPARTMENT_OTHER): Payer: Self-pay | Admitting: *Deleted

## 2022-07-28 MED ORDER — SULFAMETHOXAZOLE-TRIMETHOPRIM 800-160 MG PO TABS: 1.0000 | ORAL_TABLET | Freq: Two times a day (BID) | ORAL | 0 refills | Status: AC

## 2022-07-30 ENCOUNTER — Ambulatory Visit: Payer: Medicare PPO | Admitting: Adult Health

## 2022-08-07 ENCOUNTER — Other Ambulatory Visit: Payer: Self-pay | Admitting: Gastroenterology

## 2022-08-07 ENCOUNTER — Other Ambulatory Visit: Payer: Self-pay | Admitting: Urology

## 2022-08-12 ENCOUNTER — Other Ambulatory Visit: Payer: Self-pay | Admitting: Family Medicine

## 2022-08-12 DIAGNOSIS — R0602 Shortness of breath: Secondary | ICD-10-CM

## 2022-08-13 ENCOUNTER — Other Ambulatory Visit: Payer: Self-pay | Admitting: Nurse Practitioner

## 2022-08-13 DIAGNOSIS — J301 Allergic rhinitis due to pollen: Secondary | ICD-10-CM

## 2022-08-15 ENCOUNTER — Other Ambulatory Visit: Payer: Self-pay | Admitting: Family Medicine

## 2022-08-16 ENCOUNTER — Other Ambulatory Visit: Payer: Self-pay | Admitting: Family Medicine

## 2022-08-21 ENCOUNTER — Other Ambulatory Visit: Payer: Self-pay | Admitting: Urology

## 2022-08-24 ENCOUNTER — Ambulatory Visit: Payer: Medicare PPO | Admitting: Family Medicine

## 2022-08-24 ENCOUNTER — Encounter: Payer: Self-pay | Admitting: Family Medicine

## 2022-08-24 VITALS — BP 118/64 | HR 91 | Temp 98.2°F | Ht 72.0 in | Wt 256.0 lb

## 2022-08-24 DIAGNOSIS — E1165 Type 2 diabetes mellitus with hyperglycemia: Secondary | ICD-10-CM

## 2022-08-24 DIAGNOSIS — F3342 Major depressive disorder, recurrent, in full remission: Secondary | ICD-10-CM

## 2022-08-24 DIAGNOSIS — I10 Essential (primary) hypertension: Secondary | ICD-10-CM | POA: Diagnosis not present

## 2022-08-24 DIAGNOSIS — E782 Mixed hyperlipidemia: Secondary | ICD-10-CM

## 2022-08-24 DIAGNOSIS — I700 Atherosclerosis of aorta: Secondary | ICD-10-CM | POA: Diagnosis not present

## 2022-08-24 LAB — COMPREHENSIVE METABOLIC PANEL
ALT: 9 U/L (ref 0–35)
AST: 11 U/L (ref 0–37)
Albumin: 3.8 g/dL (ref 3.5–5.2)
Alkaline Phosphatase: 71 U/L (ref 39–117)
BUN: 23 mg/dL (ref 6–23)
CO2: 35 mEq/L — ABNORMAL HIGH (ref 19–32)
Calcium: 9.6 mg/dL (ref 8.4–10.5)
Chloride: 101 mEq/L (ref 96–112)
Creatinine, Ser: 0.67 mg/dL (ref 0.40–1.20)
GFR: 81.29 mL/min (ref >60.00)
Glucose, Bld: 117 mg/dL — ABNORMAL HIGH (ref 70–99)
Potassium: 4.4 mEq/L (ref 3.5–5.1)
Sodium: 143 mEq/L (ref 135–145)
Total Bilirubin: 0.3 mg/dL (ref 0.2–1.2)
Total Protein: 6.4 g/dL (ref 6.0–8.3)

## 2022-08-24 LAB — CBC WITH DIFFERENTIAL/PLATELET
Basophils Absolute: 0 10*3/uL (ref 0.0–0.1)
Basophils Relative: 0.5 % (ref 0.0–3.0)
Eosinophils Absolute: 0.3 10*3/uL (ref 0.0–0.7)
Eosinophils Relative: 3.2 % (ref 0.0–5.0)
HCT: 39.2 % (ref 36.0–46.0)
Hemoglobin: 13 g/dL (ref 12.0–15.0)
Lymphocytes Relative: 15.6 % (ref 12.0–46.0)
Lymphs Abs: 1.6 10*3/uL (ref 0.7–4.0)
MCHC: 33.1 g/dL (ref 30.0–36.0)
MCV: 92 fl (ref 78.0–100.0)
Monocytes Absolute: 0.7 10*3/uL (ref 0.1–1.0)
Monocytes Relative: 6.5 % (ref 3.0–12.0)
Neutro Abs: 7.8 10*3/uL — ABNORMAL HIGH (ref 1.4–7.7)
Neutrophils Relative %: 74.2 % (ref 43.0–77.0)
Platelets: 267 10*3/uL (ref 150.0–400.0)
RBC: 4.26 Mil/uL (ref 3.87–5.11)
RDW: 14.2 % (ref 11.5–15.5)
WBC: 10.5 10*3/uL (ref 4.0–10.5)

## 2022-08-24 NOTE — Patient Instructions (Addendum)
Please stop by lab before you go If you have mychart- we will send your results within 3 business days of us rece[place]iving them.  If you do not have mychart- we will call you about results within 5 business days of [name] receiving them.  *please also note that you will see labs on mychart as soon as they post. I will later go in and write notes on them- will say "notes from Dr. Durene Cal"   Glad you are doing well- hoping for good labs in particular a1c  Recommended follow up: Return in about 4 months (around 12/24/2022) for followup or sooner if needed.Schedule b4 you leave.

## 2022-08-24 NOTE — Progress Notes (Signed)
Phone [phone] In person visit   Subjective:   [name] is a 82 y.o. year old very pleasant female patient who presents for/with See problem oriented charting Chief Complaint  Patient presents with   Medical Management of Chronic Issues    Pt is still having UTI and yeast issuses, eye exam requested.   Diabetes   Hypertension    Past Medical History-  Patient Active Problem List   Diagnosis Date Noted   Perineal sinus 03/25/2021    Priority: High   Chronic UTI 02/23/2021    Priority: High   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 11/22/2019    Priority: High   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    Priority: High   Bronchiectasis 10/18/2015    Priority: High   Recurrent major depression in full remission 01/03/2014    Priority: High   Poorly controlled diabetes mellitus 12/28/2006    Priority: High   Dyspnea on exertion 02/22/2019    Priority: Medium    Adenomatous polyp 12/20/2018    Priority: Medium    Personal history of colonic polyps 05/18/2017    Priority: Medium    Aortic atherosclerosis 02/16/2016    Priority: Medium    Solitary pulmonary nodule 01/08/2016    Priority: Medium    IBS (irritable bowel syndrome) 07/17/2014    Priority: Medium    Essential hypertension 01/03/2014    Priority: Medium    Primary open-angle glaucoma 08/02/2012    Priority: Medium    Fatty liver 03/18/2009    Priority: Medium    Hyperlipidemia 07/26/2007    Priority: Medium    ANEMIA, B12 DEFICIENCY 12/29/2006    Priority: Medium    RESTLESS LEG SYNDROME, MILD 12/28/2006    Priority: Medium    Osteoporosis 12/12/2006    Priority: Medium    Pain due to onychomycosis of toenails of both feet 11/08/2018    Priority: Low   History of total knee replacement, bilateral 11/14/2017    Priority: Low   Arthritis of midfoot 03/03/2016    Priority: Low   Hammertoes of both feet 03/03/2016    Priority: Low   Posterior tibial tendon dysfunction 01/03/2014     Priority: Low   Osteoarthritis, knee 01/03/2014    Priority: Low   Glaucoma 01/03/2014    Priority: Low   Obesity (BMI 30-39.9) 06/15/2013    Priority: Low   Foot tendinitis 07/20/2010    Priority: Low   INSOMNIA, CHRONIC 07/25/2009    Priority: Low   GERD 12/25/2007    Priority: Low   Idiopathic peripheral neuropathy 12/28/2006    Priority: Low   BREAST CANCER, HX OF 12/12/2006    Priority: Low   Chronic diastolic heart failure 09/02/2021   Right lower quadrant abdominal pain 06/16/2021   Hoarseness 10/17/2020   Physical deconditioning 02/22/2019    Medications- reviewed and updated Current Outpatient Medications  Medication Sig Dispense Refill   ACCU-CHEK GUIDE test strip USE 1 STRIP ONCE DAILY TO  CHECK  GLUCOSE 100 each 0   atenolol (TENORMIN) 25 MG tablet Take 1 tablet by mouth once daily 90 tablet 0   Bacillus Coagulans-Inulin (ALIGN PREBIOTIC-PROBIOTIC PO) Take 1 capsule by mouth daily.      BD PEN NEEDLE NANO 2ND GEN 32G X 4 MM MISC USE AS DIRECTED DAILY 100 each 0   cetirizine (ZYRTEC) 10 MG tablet Take 10 mg by mouth every morning.      Cholecalciferol (VITAMIN D3) 125 MCG (5000 UT) TABS Take 5,000 Units  by mouth daily.     Coenzyme Q10 (COQ10) 200 MG CAPS Take 200 mg by mouth daily.     desvenlafaxine (PRISTIQ) 50 MG 24 hr tablet Take 1 tablet (50 mg total) by mouth daily. 90 tablet 3   dicyclomine (BENTYL) 20 MG tablet TAKE 1 TABLET BY MOUTH 4 TIMES DAILY (MORNING,  NOON,  EVENING,  AND  BEDTIME). 120 tablet 0   dorzolamide-timolol (COSOPT) 22.3-6.8 MG/ML ophthalmic solution Place 1 drop into both eyes 2 (two) times daily.      ESTRING 2 MG vaginal ring Place 2 mg vaginally every 3 (three) months. INSERT 1 RING  INTO VAGINA EVERY 3 MONTHS 1 each 0   FIBER PO Take 2 tablets by mouth every evening.     fluticasone (FLONASE) 50 MCG/ACT nasal spray Use 1 spray(s) in each nostril once daily 16 g 11   furosemide (LASIX) 20 MG tablet Take 1 tablet (20 mg total) by mouth  daily as needed for fluid or edema. 30 tablet 0   gabapentin (NEURONTIN) 100 MG capsule TAKE 1 CAPSULE BY MOUTH THREE TIMES DAILY AS NEEDED FOR  NEUROPATHIC  PAIN. 270 capsule 0   glimepiride (AMARYL) 4 MG tablet TAKE 2 TABLETS BY MOUTH ONCE DAILY WITH BREAKFAST 180 tablet 0   Lancets Misc. (ACCU-CHEK FASTCLIX LANCET) KIT Use to test blood sugars daily. Dx: E11.9 1 kit 5   latanoprost (XALATAN) 0.005 % ophthalmic solution Place 1 drop into the right eye at bedtime.      lovastatin (MEVACOR) 10 MG tablet Take 1 tablet by mouth once daily 90 tablet 0   methenamine (HIPREX) 1 g tablet Take 1 tablet (1 g total) by mouth 2 (two) times daily with a meal. 60 tablet 11   metroNIDAZOLE (FLAGYL) 500 MG tablet Take 1 tablet (500 mg total) by mouth 2 (two) times daily. 14 tablet 0   metroNIDAZOLE (METROGEL) 0.75 % vaginal gel Place 1 Applicatorful vaginally at bedtime. Use for 5 nights. 70 g 0   montelukast (SINGULAIR) 10 MG tablet TAKE 1 TABLET BY MOUTH AT BEDTIME 30 tablet 0   Multiple Vitamin (MULTIVITAMIN WITH MINERALS) TABS tablet Take 1 tablet by mouth daily. 3 Fruit Capsules 3 Vegetable Capsules - Balance of Nature 30 tablet 0   MYRBETRIQ 50 MG TB24 tablet Take 1 tablet by mouth once daily 30 tablet 0   pramipexole (MIRAPEX) 0.5 MG tablet TAKE 1 TABLET BY MOUTH AT BEDTIME 90 tablet 0   Selenium 200 MCG CAPS Take 200 mcg by mouth at bedtime.      Semaglutide, 1 MG/DOSE, 4 MG/3ML SOPN Inject 1 mg as directed once a week. 3 mL 5   SYMBICORT 80-4.5 MCG/ACT inhaler Inhale 2 puffs by mouth twice daily 11 g 0   tamsulosin (FLOMAX) 0.4 MG CAPS capsule Take 1 capsule by mouth at bedtime 30 capsule 0   temazepam (RESTORIL) 15 MG capsule Take 1 capsule by mouth at bedtime 30 capsule 5   TRESIBA FLEXTOUCH 200 UNIT/ML FlexTouch Pen INJECT 80 UNITS SUBCUTANEOUSLY IN THE MORNING 9 mL 0   valsartan (DIOVAN) 80 MG tablet Take 1 tablet by mouth once daily 90 tablet 0   vitamin B-12 (CYANOCOBALAMIN) 1000 MCG tablet Take  1,000 mcg by mouth daily.     No current facility-administered medications for this visit.     Objective:  BP 118/64   Pulse 91   Temp 98.2 F (36.8 C)   Ht 6' (1.829 m)   Wt 256 lb (116.1  kg)   LMP 05/10/1992 Comment: partial  SpO2 91%   BMI 34.72 kg/m  Gen: NAD, resting comfortably CV: RRR no murmurs rubs or gallops Lungs: CTAB no crackles, wheeze, rhonchi Ext: no edema Skin: warm, dry    Assessment and Plan   # Diabetes S: Medication: tresiba 84 units, Ozempic 1 mg- didn't feel well when starting but tolerating, glimepiride 8 mg CBGs- as low as 105- but recently back to 130 with some ice cream- about a week Exercise and diet- slight weight gain on home scales -doing some foot pedaling intermittently Lab Results  Component Value Date   HGBA1C 8.3 (H) 04/27/2022   HGBA1C 9.1 (H) 12/31/2021   HGBA1C 7.8 (H) 07/02/2021   A/P: hopefully improved- update a1c today. Continue current meds for now   #hypertension S: medication: atenolol 25 mg daily, valsartan 80 mg BP Readings from Last 3 Encounters:  08/24/22 118/64  07/15/22 (!) 116/56  04/27/22 126/62  A/P: stable- continue current medicines   #hyperlipidemia S: Medication: lovastatin 10 mg Lab Results  Component Value Date   CHOL 111 12/31/2021   HDL 43.60 12/31/2021   LDLCALC 50 12/31/2021   LDLDIRECT 77.0 02/16/2016   TRIG 88.0 12/31/2021   CHOLHDL 3 12/31/2021   A/P: excellent control- continue current medications with LDL under 70  # Depression S: Medication: Pristiq 50 mg, temazepam for sleep (no falls)     08/24/2022    1:13 PM 07/15/2022    4:37 PM 05/14/2022    2:59 PM  Depression screen PHQ 2/9  Decreased Interest 0 0 0  Down, Depressed, Hopeless 0 0 0  PHQ - 2 Score 0 0 0  Altered sleeping 0  0  Tired, decreased energy 0  0  Change in appetite 0  0  Feeling bad or failure about yourself  0  0  Trouble concentrating 0  0  Moving slowly or fidgety/restless 0  0  Suicidal thoughts 0  0  PHQ-9  Score 0  0  Difficult doing work/chores Not difficult at all  Not difficult at all  A/P: full remission- continue current medications   #restless legs syndrome- usually controlled on mirapex but did have one night had to take an extra   #hoarseness- seeing ENT soon- also some ear fullness to be evaluated  Recommended follow up: Return in about 4 months (around 12/24/2022) for followup or sooner if needed.Schedule b4 you leave.  Lab/Order associations:   ICD-10-CM   1. Poorly controlled diabetes mellitus  E11.65 HgB A1c    Comp Met (CMET)    CBC w/Diff    2. Essential hypertension  I10     3. Mixed hyperlipidemia  E78.2     4. Recurrent major depression in full remission Chronic F33.42     5. Aortic atherosclerosis Chronic I70.0       No orders of the defined types were placed in this encounter.   Return precautions advised.  Tana Conch, MD

## 2022-08-25 LAB — HEMOGLOBIN A1C: Hgb A1c MFr Bld: 7.6 % — ABNORMAL HIGH (ref 4.6–6.5)

## 2022-08-27 ENCOUNTER — Other Ambulatory Visit: Payer: Self-pay | Admitting: Family Medicine

## 2022-08-30 NOTE — Telephone Encounter (Signed)
Last refill: 03/01/22 #30, 5 Last OV: 08/24/22 dx. DM, HTN, hyperlipidemia

## 2022-09-03 ENCOUNTER — Other Ambulatory Visit: Payer: Self-pay | Admitting: Gastroenterology

## 2022-09-03 ENCOUNTER — Other Ambulatory Visit: Payer: Self-pay | Admitting: Urology

## 2022-09-05 ENCOUNTER — Other Ambulatory Visit: Payer: Self-pay | Admitting: Family Medicine

## 2022-09-06 ENCOUNTER — Other Ambulatory Visit: Payer: Self-pay | Admitting: Family Medicine

## 2022-09-07 ENCOUNTER — Other Ambulatory Visit (HOSPITAL_BASED_OUTPATIENT_CLINIC_OR_DEPARTMENT_OTHER): Payer: Self-pay

## 2022-09-07 ENCOUNTER — Other Ambulatory Visit (INDEPENDENT_AMBULATORY_CARE_PROVIDER_SITE_OTHER): Payer: Medicare PPO

## 2022-09-07 ENCOUNTER — Other Ambulatory Visit (HOSPITAL_BASED_OUTPATIENT_CLINIC_OR_DEPARTMENT_OTHER): Payer: Self-pay | Admitting: Obstetrics & Gynecology

## 2022-09-07 DIAGNOSIS — N3000 Acute cystitis without hematuria: Secondary | ICD-10-CM

## 2022-09-07 DIAGNOSIS — R300 Dysuria: Secondary | ICD-10-CM | POA: Diagnosis not present

## 2022-09-07 DIAGNOSIS — R3915 Urgency of urination: Secondary | ICD-10-CM

## 2022-09-07 LAB — POCT URINALYSIS DIPSTICK
Bilirubin, UA: NEGATIVE
Glucose, UA: NEGATIVE
Ketones, UA: NEGATIVE
Nitrite, UA: POSITIVE
Protein, UA: POSITIVE — AB
Spec Grav, UA: 1.02 — AB (ref 1.010–1.025)
Urobilinogen, UA: 0.2 E.U./dL
pH, UA: 7 (ref 5.0–8.0)

## 2022-09-07 MED ORDER — FLUCONAZOLE 150 MG PO TABS
ORAL_TABLET | ORAL | 0 refills | Status: DC
Start: 2022-09-07 — End: 2022-09-14

## 2022-09-07 MED ORDER — AMOXICILLIN 500 MG PO CAPS
500.0000 mg | ORAL_CAPSULE | Freq: Three times a day (TID) | ORAL | 0 refills | Status: AC
Start: 2022-09-07 — End: 2022-09-14

## 2022-09-07 NOTE — Progress Notes (Signed)
Husband brought by a sample of urine to be tested. Patient is complaining of pain while urinating. tbw

## 2022-09-10 ENCOUNTER — Other Ambulatory Visit: Payer: Self-pay | Admitting: Emergency Medicine

## 2022-09-10 ENCOUNTER — Other Ambulatory Visit: Payer: Self-pay | Admitting: Family Medicine

## 2022-09-10 ENCOUNTER — Other Ambulatory Visit: Payer: Self-pay | Admitting: Urology

## 2022-09-10 ENCOUNTER — Other Ambulatory Visit: Payer: Self-pay | Admitting: Gastroenterology

## 2022-09-10 DIAGNOSIS — J301 Allergic rhinitis due to pollen: Secondary | ICD-10-CM

## 2022-09-13 ENCOUNTER — Other Ambulatory Visit: Payer: Self-pay | Admitting: Family Medicine

## 2022-09-13 DIAGNOSIS — R0602 Shortness of breath: Secondary | ICD-10-CM

## 2022-09-14 ENCOUNTER — Encounter: Payer: Self-pay | Admitting: Adult Health

## 2022-09-14 ENCOUNTER — Ambulatory Visit: Payer: Medicare PPO | Admitting: Adult Health

## 2022-09-14 VITALS — BP 120/50 | HR 95

## 2022-09-14 DIAGNOSIS — J310 Chronic rhinitis: Secondary | ICD-10-CM | POA: Diagnosis not present

## 2022-09-14 DIAGNOSIS — J301 Allergic rhinitis due to pollen: Secondary | ICD-10-CM

## 2022-09-14 DIAGNOSIS — J479 Bronchiectasis, uncomplicated: Secondary | ICD-10-CM

## 2022-09-14 DIAGNOSIS — R0602 Shortness of breath: Secondary | ICD-10-CM | POA: Diagnosis not present

## 2022-09-14 DIAGNOSIS — R5381 Other malaise: Secondary | ICD-10-CM

## 2022-09-14 DIAGNOSIS — R490 Dysphonia: Secondary | ICD-10-CM

## 2022-09-14 MED ORDER — ALBUTEROL SULFATE HFA 108 (90 BASE) MCG/ACT IN AERS: 2.0000 | INHALATION_SPRAY | Freq: Four times a day (QID) | RESPIRATORY_TRACT | 6 refills | Status: DC | PRN

## 2022-09-14 MED ORDER — MONTELUKAST SODIUM 10 MG PO TABS
ORAL_TABLET | ORAL | 11 refills | Status: AC
Start: 2022-09-14 — End: ?

## 2022-09-14 MED ORDER — SYMBICORT 80-4.5 MCG/ACT IN AERO
2.0000 | INHALATION_SPRAY | Freq: Two times a day (BID) | RESPIRATORY_TRACT | 11 refills | Status: DC
Start: 2022-09-14 — End: 2023-10-10

## 2022-09-14 NOTE — Patient Instructions (Signed)
Continue on Symbicort 2 puffs Twice daily, rinse after use.  Follow up ENT for hoarseness .  Albuterol inhaler As needed   Continue on Zyrtec , Singulair and Nasonex daily.  Activity as tolerated.  Follow up with Dr. Delton Coombes 1 year and As needed   Please contact office for sooner follow up if symptoms do not improve or worsen or seek emergency care

## 2022-09-14 NOTE — Assessment & Plan Note (Signed)
Controlled on current maintenance regimen.  No changes  Plan  Patient Instructions  Continue on Symbicort 2 puffs Twice daily, rinse after use.  Follow up ENT for hoarseness .  Albuterol inhaler As needed   Continue on Zyrtec , Singulair and Nasonex daily.  Activity as tolerated.  Follow up with Dr. Delton Coombes 1 year and As needed   Please contact office for sooner follow up if symptoms do not improve or worsen or seek emergency care

## 2022-09-14 NOTE — Progress Notes (Signed)
@Patient  ID: [name], female    DOB: [birth year], 82 y.o.   MRN: [id number]  Chief Complaint  Patient presents with   Follow-up    Referring provider: Shelva Majestic, MD  HPI: 82 year old female never smoker followed for bronchiectasis and tracheomalacia History of chronic respiratory failure on oxygen discontinued in 2023, chronic diastolic heart failure  TEST/EVENTS :  Pulmonary function testing 12/02/2015 , shows mixed restriction and obstruction by spirometry without a bronchodilator response, normal lung volumes and a decreased diffusion capacity that corrects to the normal range when adjusted for alveolar volume.   High-resolution CT chest December 04, 2015 showed negative for ILD.  Severe tracheomalacia, mild bronchiectasis and multiple tiny pulmonary nodules 4 mm or less   PFTs June 01, 2021 shows a moderate restriction with FEV1 at 51%, ratio 77, FVC 50%, DLCO 76%   CT chest June 10, 2021 shows left small effusion, mild bronchiectasis, small stable pulmonary nodules   Coronary CT chest October 08, 2021 showed resolution of small left pleural effusion.  No acute pulmonary process noted in the visualized lung fields  09/14/2022 Follow up ; Bronchiectasis and Tracheomalacia  .  Patient presents for 48-month follow-up.  Patient is followed for mild bronchiectasis.  Remains on Symbicort twice daily.  Says overall breathing is doing okay.  Does have some chronic allergies.  Takes Zyrtec , Singulair and Nasonex daily. No albuterol use.  Needs refills of her medications. She says her activity level is at baseline.  She is somewhat sedentary.  Cannot walk long distances due to joint issues.  She denies any fever, chest pain, discolored mucus.  Had hoarseness last visit referred to ENT. Had to reschedule appointment , going in few weeks.   Prevnar 20, RSV are utd.    Allergies  Allergen Reactions   Nitrofurantoin Other (See Comments)    Severe headache HEADACHE   Levaquin  [Levofloxacin In D5w] Other (See Comments)    Pain in tendons    Brimonidine Other (See Comments)    Made eyes and surrounding areas RED/ was an eye drop   Cephalexin Diarrhea and Other (See Comments)    Patient can't remember reaction (per chart at [facility] states diarrhea) (tolerates Augmentin fine)   Erythromycin Ethylsuccinate Hives and Diarrhea   Oxycodone Itching    Immunization History  Administered Date(s) Administered   Fluad Quad(high Dose 65+) 02/22/2019, 02/20/2020, 02/18/2021, 01/27/2022   H1N1 06/05/2008   Influenza Split 02/07/2012   Influenza Whole 03/10/2000, 01/24/2008, 03/10/2009, 01/20/2010   Influenza, High Dose Seasonal PF 02/12/2013, 01/31/2017, 01/19/2018   Influenza,inj,Quad PF,6+ Mos 01/17/2014, 03/27/2015   Influenza,inj,quad, With Preservative 02/07/2017   Influenza-Unspecified 02/09/2016   PFIZER(Purple Top)SARS-COV-2 Vaccination 06/03/2019, 06/25/2019, 04/24/2020   PNEUMOCOCCAL CONJUGATE-20 04/27/2022   Pneumococcal Conjugate-13 01/17/2014   Pneumococcal Polysaccharide-23 03/10/2000, 05/02/2007, 01/02/2012   Respiratory Syncytial Virus Vaccine,Recomb Aduvanted(Arexvy) 03/08/2022   Td 05/10/1992, 12/25/2007   Zoster Recombinat (Shingrix) 02/24/2018, 04/26/2018    Past Medical History:  Diagnosis Date   Allergy    Anemia    Anxiety    Arthritis    right knee;injections every 6months    Asthma    use daily   Breast cancer (HCC) 1994/1995   HX BREAST CANCER/ right brreast and left breast in 1995   Bronchiectasis (HCC)    COMPRESSION FRACTURE, LUMBAR VERTEBRAE 08/21/2008   Qualifier: Diagnosis of  By: Lovell Sheehan MD, John E    Depression    Diabetes mellitus    takes Amaryl and Januvia daily  Diverticulitis    Dyspnea    with activity   Early cataracts, bilateral    Eczema    Endometriosis    Fatty liver    Gastritis    Glaucoma    Hammer toe    History of kidney stones    Hyperlipidemia associated with type 2 diabetes mellitus (HCC)  07/26/2007   Diet/exercise control.     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 21 capsule 0   atenolol (TENORMIN) 25 MG tablet Take 1 tablet by mouth once daily 90 tablet 0   Bacillus Coagulans-Inulin (ALIGN PREBIOTIC-PROBIOTIC PO) Take 1 capsule by mouth daily.      BD PEN NEEDLE NANO 2ND GEN 32G X 4 MM MISC USE AS DIRECTED DAILY 100 each 0   cetirizine (ZYRTEC) 10 MG tablet Take 10 mg by mouth every morning.      Cholecalciferol (VITAMIN D3) 125 MCG (5000 UT) TABS Take 5,000 Units by mouth daily.     Coenzyme Q10 (COQ10) 200 MG CAPS Take 200 mg by mouth daily.     desvenlafaxine (PRISTIQ) 50 MG 24 hr tablet Take 1 tablet (50 mg total) by mouth daily. 90 tablet 3   dicyclomine (BENTYL) 20 MG tablet TAKE 1 TABLET BY MOUTH 4 TIMES DAILY (  MORNING,  NOON,  EVENING,  AND  BEDTIME). 120  tablet 0   dorzolamide-timolol (COSOPT) 22.3-6.8 MG/ML ophthalmic solution Place 1 drop into both eyes 2 (two) times daily.      ESTRING 2 MG vaginal ring Place 2 mg vaginally every 3 (three) months. INSERT 1 RING  INTO VAGINA EVERY 3 MONTHS 1 each 0   FIBER PO Take 2 tablets by mouth every evening.     fluticasone (FLONASE) 50 MCG/ACT nasal spray Use 1 spray(s) in each nostril once daily 16 g 11   gabapentin (NEURONTIN) 100 MG capsule TAKE 1 CAPSULE BY MOUTH THREE TIMES DAILY AS NEEDED FOR  NEUROPATHIC  PAIN. 270 capsule 0   glimepiride (AMARYL) 4 MG tablet TAKE 2 TABLETS BY MOUTH ONCE DAILY WITH BREAKFAST 180 tablet 0   Lancets  Misc. (ACCU-CHEK FASTCLIX LANCET) KIT Use to test blood sugars daily. Dx: E11.9 1 kit 5   latanoprost (XALATAN) 0.005 % ophthalmic solution Place 1 drop into the right eye at bedtime.      lovastatin (MEVACOR) 10 MG tablet Take 1 tablet by mouth once daily 90 tablet 0   Multiple Vitamin (MULTIVITAMIN WITH MINERALS) TABS tablet Take 1 tablet by mouth daily. 3 Fruit Capsules 3 Vegetable Capsules - Balance of Nature 30 tablet 0   MYRBETRIQ 50 MG TB24 tablet Take 1 tablet by mouth once daily 30 tablet 0   OZEMPIC, 1 MG/DOSE, 4 MG/3ML SOPN INJECT 1MG  INTO THE SKIN AS DIRECTED ONCE A WEEK 3 mL 0   pramipexole (MIRAPEX) 0.5 MG tablet TAKE 1 TABLET BY MOUTH AT BEDTIME 90 tablet 0   Selenium 200 MCG CAPS Take 200 mcg by mouth at bedtime.      tamsulosin (FLOMAX) 0.4 MG CAPS capsule Take 1 capsule by mouth at bedtime 30 capsule 0   temazepam (RESTORIL) 15 MG capsule Take 1 capsule by mouth at bedtime 30 capsule 0   TRESIBA FLEXTOUCH 200 UNIT/ML FlexTouch Pen INJECT 80 UNITS SUBCUTANEOUSLY IN THE MORNING 9 mL 0   valsartan (DIOVAN) 80 MG tablet Take 1 tablet by mouth once daily 90 tablet 0   vitamin B-12 (CYANOCOBALAMIN) 1000 MCG tablet Take 1,000 mcg by mouth daily.     montelukast (SINGULAIR) 10 MG tablet TAKE 1 TABLET BY MOUTH AT BEDTIME . APPOINTMENT REQUIRED FOR FUTURE REFILLS  30 tablet 4   SYMBICORT 80-4.5 MCG/ACT inhaler Inhale 2 puffs by mouth twice daily 11 g 0   methenamine (HIPREX) 1 g tablet Take 1 tablet (1 g total) by mouth 2 (two) times daily with a meal. (Patient not taking: Reported on 09/14/2022) 60 tablet 11   fluconazole (DIFLUCAN) 150 MG tablet Take 1 tab and repeat in 72 hours (Patient not taking: Reported on 09/14/2022) 2 tablet 0   furosemide (LASIX) 20 MG tablet Take 1 tablet (20 mg total) by mouth daily as needed for fluid or edema. (Patient not taking: Reported on 09/14/2022) 30 tablet 0   metroNIDAZOLE (METROGEL) 0.75 % vaginal gel Place 1 Applicatorful vaginally at bedtime. Use for 5 nights. (Patient not taking: Reported on 09/14/2022) 70 g 0   No facility-administered medications prior to visit.     Review of Systems:   Constitutional:   No  weight loss, night sweats,  Fevers, chills, fatigue, or  lassitude.  HEENT:   No headaches,  Difficulty swallowing,  Tooth/dental problems, or  Sore throat,                No sneezing, itching, ear ache,  +nasal congestion, post nasal drip,   CV:  No chest pain,  Orthopnea, PND, swelling in lower extremities, anasarca, dizziness, palpitations, syncope.   GI  No heartburn, indigestion, abdominal pain, nausea, vomiting, diarrhea, change in bowel habits, loss of appetite, bloody stools.   Resp: No shortness of breath with exertion or at rest.  No excess mucus, no productive cough,  No non-productive cough,  No coughing up of blood.  No change in color of mucus.  No wheezing.  No chest wall deformity  Skin: no rash or lesions.  GU: no dysuria, change in color of urine, no urgency or frequency.  No flank pain, no hematuria   MS:  No joint pain or swelling.  No decreased range of motion.  No back pain.    Physical Exam  BP [name])  120/50 (BP Location: Right Arm, Cuff Size: Normal)   Pulse 95   LMP 05/10/1992 Comment: partial  SpO2 92%   GEN: A/Ox3; pleasant , NAD, well nourished , obese in wc    HEENT:   [place]/AT,   NOSE-clear, THROAT-clear, no lesions, no postnasal drip or exudate noted.   NECK:  Supple w/ fair ROM; no JVD; normal carotid impulses w/o bruits; no thyromegaly or nodules palpated; no lymphadenopathy.    RESP  Clear  P & A; w/o, wheezes/ rales/ or rhonchi. no accessory muscle use, no dullness to percussion  CARD:  RRR, no m/r/g, no peripheral edema, pulses intact, no cyanosis or clubbing.  GI:   Soft & nt; nml bowel sounds; no organomegaly or masses detected.   Musco: Warm bil, no deformities or joint swelling noted.   Neuro: alert, no focal deficits noted.    Skin: Warm, no lesions or rashes        Imaging: No results found.       Latest Ref Rng & Units 06/01/2021    2:52 PM 12/02/2015   11:51 AM  PFT Results  FVC-Pre L 1.70  2.52   FVC-Predicted Pre % 48  67   FVC-Post L 1.79  2.63   FVC-Predicted Post % 50  70   Pre FEV1/FVC % % 78  77   Post FEV1/FCV % % 77  77   FEV1-Pre L 1.32  1.94   FEV1-Predicted Pre % 49  68   FEV1-Post L 1.37  2.02   DLCO uncorrected ml/min/mmHg 18.21  25.72   DLCO UNC% % 76  74   DLCO corrected ml/min/mmHg 18.21  25.96   DLCO COR %Predicted % 76  75   DLVA Predicted % 125  94   TLC L 4.32  5.02   TLC % Predicted % 69  81   RV % Predicted % 86  91     No results found for: "NITRICOXIDE"      Assessment & Plan:   Bronchiectasis (HCC) Appears stable without flare.  Continue with mucociliary clearance.  Plan  Patient Instructions  Continue on Symbicort 2 puffs Twice daily, rinse after use.  Follow up ENT for hoarseness .  Albuterol inhaler As needed   Continue on Zyrtec , Singulair and Nasonex daily.  Activity as tolerated.  Follow up with Dr. Delton Coombes 1 year and As needed   Please contact office for sooner follow up if symptoms do not improve or worsen or seek emergency care     Chronic rhinitis Controlled on current maintenance regimen.  No changes  Plan  Patient Instructions  Continue on Symbicort 2 puffs  Twice daily, rinse after use.  Follow up ENT for hoarseness .  Albuterol inhaler As needed   Continue on Zyrtec , Singulair and Nasonex daily.  Activity as tolerated.  Follow up with Dr. Delton Coombes 1 year and As needed   Please contact office for sooner follow up if symptoms do not improve or worsen or seek emergency care     Physical deconditioning Activity as tolerated  Hoarseness Follow-up with ENT as planned     Rubye Oaks, NP 09/14/2022

## 2022-09-14 NOTE — Assessment & Plan Note (Signed)
Follow up with ENT as planned  

## 2022-09-14 NOTE — Assessment & Plan Note (Signed)
Activity as tolerated

## 2022-09-14 NOTE — Assessment & Plan Note (Signed)
Appears stable without flare.  Continue with mucociliary clearance.  Plan  Patient Instructions  Continue on Symbicort 2 puffs Twice daily, rinse after use.  Follow up ENT for hoarseness .  Albuterol inhaler As needed   Continue on Zyrtec , Singulair and Nasonex daily.  Activity as tolerated.  Follow up with Dr. Delton Coombes 1 year and As needed   Please contact office for sooner follow up if symptoms do not improve or worsen or seek emergency care

## 2022-09-16 ENCOUNTER — Encounter: Payer: Self-pay | Admitting: Family Medicine

## 2022-09-17 ENCOUNTER — Other Ambulatory Visit: Payer: Self-pay | Admitting: Family Medicine

## 2022-09-17 ENCOUNTER — Other Ambulatory Visit: Payer: Self-pay | Admitting: Urology

## 2022-09-23 ENCOUNTER — Other Ambulatory Visit: Payer: Self-pay | Admitting: Family Medicine

## 2022-09-25 ENCOUNTER — Other Ambulatory Visit: Payer: Self-pay | Admitting: Family Medicine

## 2022-09-26 ENCOUNTER — Encounter: Payer: Self-pay | Admitting: Family Medicine

## 2022-09-26 ENCOUNTER — Other Ambulatory Visit: Payer: Self-pay | Admitting: Family Medicine

## 2022-09-27 ENCOUNTER — Encounter: Payer: Self-pay | Admitting: Family Medicine

## 2022-09-27 MED ORDER — TRESIBA FLEXTOUCH 200 UNIT/ML ~~LOC~~ SOPN: 86.0000 [IU] | PEN_INJECTOR | Freq: Every day | SUBCUTANEOUS | 3 refills | Status: DC

## 2022-09-28 ENCOUNTER — Other Ambulatory Visit (HOSPITAL_BASED_OUTPATIENT_CLINIC_OR_DEPARTMENT_OTHER): Payer: Self-pay | Admitting: *Deleted

## 2022-09-28 MED ORDER — SULFAMETHOXAZOLE-TRIMETHOPRIM 800-160 MG PO TABS: 1.0000 | ORAL_TABLET | Freq: Two times a day (BID) | ORAL | 0 refills | Status: AC

## 2022-09-28 NOTE — Telephone Encounter (Signed)
If we filled 08/30/22 with 1 refill- has she picked up refill yet?

## 2022-09-28 NOTE — Progress Notes (Signed)
DOB verified. Informed pt that urine culture was positive for e coli. Advised that treatment with bactrim ds twice daily for 5 days is recommended as well as repeat culture in 2 weeks. Pt verbalized understanding.

## 2022-09-29 NOTE — Telephone Encounter (Signed)
This was a typo, the rx sent on 04/22 was #30 w/0rfs.

## 2022-10-04 ENCOUNTER — Other Ambulatory Visit: Payer: Self-pay | Admitting: Family Medicine

## 2022-10-07 ENCOUNTER — Other Ambulatory Visit: Payer: Self-pay | Admitting: Family Medicine

## 2022-10-08 ENCOUNTER — Other Ambulatory Visit: Payer: Self-pay | Admitting: Family Medicine

## 2022-10-13 ENCOUNTER — Other Ambulatory Visit (HOSPITAL_BASED_OUTPATIENT_CLINIC_OR_DEPARTMENT_OTHER): Payer: Self-pay | Admitting: *Deleted

## 2022-10-13 DIAGNOSIS — R300 Dysuria: Secondary | ICD-10-CM

## 2022-10-15 ENCOUNTER — Telehealth: Payer: Self-pay | Admitting: Family Medicine

## 2022-10-15 NOTE — Telephone Encounter (Signed)
Patient requests new Handicap Placard.  Requests to be advised. States it can be mailed to Patient's address or Husband will pick up.

## 2022-10-16 ENCOUNTER — Other Ambulatory Visit: Payer: Self-pay | Admitting: Urology

## 2022-10-16 ENCOUNTER — Other Ambulatory Visit: Payer: Self-pay | Admitting: Gastroenterology

## 2022-10-16 ENCOUNTER — Other Ambulatory Visit: Payer: Self-pay | Admitting: Family Medicine

## 2022-10-17 LAB — URINE CULTURE

## 2022-10-18 NOTE — Telephone Encounter (Signed)
Placard filled out and placed in the mail for pt.

## 2022-10-21 DIAGNOSIS — L905 Scar conditions and fibrosis of skin: Secondary | ICD-10-CM | POA: Diagnosis not present

## 2022-10-21 DIAGNOSIS — D225 Melanocytic nevi of trunk: Secondary | ICD-10-CM | POA: Diagnosis not present

## 2022-10-21 DIAGNOSIS — L853 Xerosis cutis: Secondary | ICD-10-CM | POA: Diagnosis not present

## 2022-10-21 DIAGNOSIS — L821 Other seborrheic keratosis: Secondary | ICD-10-CM | POA: Diagnosis not present

## 2022-10-21 DIAGNOSIS — L918 Other hypertrophic disorders of the skin: Secondary | ICD-10-CM | POA: Diagnosis not present

## 2022-10-21 DIAGNOSIS — D224 Melanocytic nevi of scalp and neck: Secondary | ICD-10-CM | POA: Diagnosis not present

## 2022-10-26 ENCOUNTER — Other Ambulatory Visit (HOSPITAL_BASED_OUTPATIENT_CLINIC_OR_DEPARTMENT_OTHER): Payer: Self-pay | Admitting: Obstetrics & Gynecology

## 2022-10-26 ENCOUNTER — Telehealth (HOSPITAL_BASED_OUTPATIENT_CLINIC_OR_DEPARTMENT_OTHER): Payer: Self-pay

## 2022-10-26 DIAGNOSIS — Z881 Allergy status to other antibiotic agents status: Secondary | ICD-10-CM

## 2022-10-26 DIAGNOSIS — N3090 Cystitis, unspecified without hematuria: Secondary | ICD-10-CM

## 2022-10-26 MED ORDER — AMOXICILLIN-POT CLAVULANATE 500-125 MG PO TABS
1.0000 | ORAL_TABLET | Freq: Two times a day (BID) | ORAL | 0 refills | Status: DC
Start: 2022-10-26 — End: 2022-11-09

## 2022-10-26 NOTE — Telephone Encounter (Signed)
Patient would like for you to look at her urine results and give her recommendations on what she should do. Please advise.

## 2022-10-28 ENCOUNTER — Other Ambulatory Visit: Payer: Self-pay | Admitting: Family Medicine

## 2022-10-28 ENCOUNTER — Other Ambulatory Visit (HOSPITAL_BASED_OUTPATIENT_CLINIC_OR_DEPARTMENT_OTHER): Payer: Self-pay | Admitting: Obstetrics & Gynecology

## 2022-10-28 NOTE — Telephone Encounter (Signed)
Patient spoke with Selena Batten and was notified of results and recommendations. tbw

## 2022-11-01 ENCOUNTER — Other Ambulatory Visit: Payer: Self-pay | Admitting: Family Medicine

## 2022-11-06 ENCOUNTER — Other Ambulatory Visit: Payer: Self-pay | Admitting: Family Medicine

## 2022-11-06 ENCOUNTER — Other Ambulatory Visit (HOSPITAL_BASED_OUTPATIENT_CLINIC_OR_DEPARTMENT_OTHER): Payer: Self-pay | Admitting: Obstetrics & Gynecology

## 2022-11-06 ENCOUNTER — Other Ambulatory Visit: Payer: Self-pay | Admitting: Gastroenterology

## 2022-11-06 DIAGNOSIS — N952 Postmenopausal atrophic vaginitis: Secondary | ICD-10-CM

## 2022-11-08 NOTE — Telephone Encounter (Signed)
Patient needs an office appointment.

## 2022-11-09 ENCOUNTER — Encounter (HOSPITAL_BASED_OUTPATIENT_CLINIC_OR_DEPARTMENT_OTHER): Payer: Self-pay | Admitting: Obstetrics & Gynecology

## 2022-11-09 ENCOUNTER — Ambulatory Visit (HOSPITAL_BASED_OUTPATIENT_CLINIC_OR_DEPARTMENT_OTHER): Payer: Medicare PPO | Admitting: Obstetrics & Gynecology

## 2022-11-09 VITALS — BP 115/51 | HR 79 | Ht 72.0 in | Wt 258.0 lb

## 2022-11-09 DIAGNOSIS — N3020 Other chronic cystitis without hematuria: Secondary | ICD-10-CM

## 2022-11-09 DIAGNOSIS — L292 Pruritus vulvae: Secondary | ICD-10-CM | POA: Diagnosis not present

## 2022-11-09 DIAGNOSIS — N952 Postmenopausal atrophic vaginitis: Secondary | ICD-10-CM

## 2022-11-09 DIAGNOSIS — K604 Rectal fistula: Secondary | ICD-10-CM

## 2022-11-09 LAB — POCT URINALYSIS DIPSTICK
Bilirubin, UA: NEGATIVE
Glucose, UA: NEGATIVE
Ketones, UA: NEGATIVE
Nitrite, UA: POSITIVE
Protein, UA: NEGATIVE
Spec Grav, UA: >=1.03 — AB (ref 1.010–1.025)
Urobilinogen, UA: 0.2 E.U./dL
pH, UA: 6 (ref 5.0–8.0)

## 2022-11-09 MED ORDER — CEPHALEXIN 500 MG PO CAPS
500.0000 mg | ORAL_CAPSULE | Freq: Three times a day (TID) | ORAL | 0 refills | Status: DC
Start: 2022-11-09 — End: 2022-11-25

## 2022-11-09 MED ORDER — ESTRADIOL 7.5 MCG/24HR VA RING
1.0000 | VAGINAL_RING | VAGINAL | 3 refills | Status: DC
Start: 2022-11-09 — End: 2024-02-21

## 2022-11-09 MED ORDER — TERCONAZOLE 0.4 % VA CREA
1.0000 | TOPICAL_CREAM | Freq: Every day | VAGINAL | 0 refills | Status: AC
Start: 2022-11-09 — End: ?

## 2022-11-09 NOTE — Progress Notes (Signed)
GYNECOLOGY  VISIT  CC:   Estring replacement, follow up urine  HPI: 82 y.o. G0P0 Married White or Caucasian female here for estring removal and replacement.  Pt having issues with recurrent/persistent UTIs.  Does have upcoming appt with Dr. Arita Miss.    Pt has hx of anal fistula with closure done by Dr. Michaell Cowing 03/2021.  This took a considerable amount of time for healing.  She does feel there is a lot of stool perianally and is concerned about possible recurrence.  Does not want to see Dr. Michaell Cowing again if possible.   Past Medical History:  Diagnosis Date   Allergy    Anemia    Anxiety    Arthritis    right knee;injections every 6months    Asthma    use daily   Breast cancer (HCC) 1994/1995   HX BREAST CANCER/ right brreast and left breast in 1995   Bronchiectasis (HCC)    COMPRESSION FRACTURE, LUMBAR VERTEBRAE 08/21/2008   Qualifier: Diagnosis of  By: Lovell Sheehan MD, Balinda Quails    Depression    Diabetes mellitus    takes Amaryl and Januvia daily   Diverticulitis    Dyspnea    with activity   Early cataracts, bilateral    Eczema    Endometriosis    Fatty liver    Gastritis    Glaucoma    Hammer toe    History of kidney stones    Hyperlipidemia associated with type 2 diabetes mellitus (HCC) 07/26/2007   Diet/exercise control.     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 8 g 6   atenolol (TENORMIN) 25 MG tablet Take 1 tablet by mouth once daily 90 tablet 0   Bacillus Coagulans-Inulin (ALIGN PREBIOTIC-PROBIOTIC PO) Take 1 capsule by mouth daily.      BD PEN NEEDLE NANO 2ND GEN 32G X 4 MM MISC USE AS DIRECTED DAILY 100 each 0   cetirizine (ZYRTEC) 10 MG tablet Take 10 mg by mouth every morning.      Cholecalciferol (VITAMIN D3) 125 MCG (5000 UT) TABS Take 5,000 Units by mouth daily.     Coenzyme Q10 (COQ10) 200 MG CAPS Take  200 mg by mouth daily.     desvenlafaxine (PRISTIQ) 50 MG 24 hr tablet Take 1 tablet (50 mg total) by mouth daily. 90 tablet 3   dicyclomine (BENTYL) 20 MG tablet TAKE 1 TABLET BY MOUTH 4 TIMES DAILY (MORNING,  NOON,  EVENING,  AND  BEDTIME) 120 tablet 0   dorzolamide-timolol (COSOPT) 22.3-6.8 MG/ML ophthalmic solution Place 1 drop into both eyes 2 (two) times daily.      FIBER PO Take 2 tablets by mouth every evening.     fluticasone (FLONASE) 50 MCG/ACT nasal spray Use 1 spray(s) in each nostril once daily 16 g 11   gabapentin (NEURONTIN) 100 MG capsule TAKE 1 CAPSULE BY MOUTH THREE TIMES DAILY AS NEEDED FOR  NEUROPATIC  PAIN. 270 capsule 0   glimepiride (AMARYL) 4 MG tablet TAKE 2 TABLETS BY MOUTH ONCE DAILY WITH BREAKFAST 180 tablet 0   Lancets Misc. (ACCU-CHEK FASTCLIX LANCET) KIT Use to test blood sugars daily. Dx: E11.9 1 kit 5   latanoprost (XALATAN) 0.005 % ophthalmic solution Place 1 drop into the right eye at bedtime.      lovastatin (MEVACOR) 10 MG tablet Take 1 tablet by mouth once daily 90 tablet 0    methenamine (HIPREX) 1 g tablet Take 1 tablet (1 g total) by mouth 2 (two) times daily with a meal. 60 tablet 11   montelukast (SINGULAIR) 10 MG tablet TAKE 1 TABLET BY MOUTH AT BEDTIME . APPOINTMENT REQUIRED FOR FUTURE REFILLS 30 tablet 11   Multiple Vitamin (MULTIVITAMIN WITH MINERALS) TABS tablet Take 1 tablet by mouth daily. 3 Fruit Capsules 3 Vegetable Capsules - Balance of Nature 30 tablet 0   MYRBETRIQ 50 MG TB24 tablet Take 1 tablet by mouth once daily 30 tablet 0   OZEMPIC, 1 MG/DOSE, 4 MG/3ML SOPN INJECT 1MG  SUBCUTANEOUSLY ONCE A WEEK AS DIRECTED 3 mL 0   pramipexole (MIRAPEX) 0.5 MG tablet TAKE 1 TABLET BY MOUTH AT BEDTIME 90 tablet 0   Selenium 200 MCG CAPS Take 200 mcg by mouth at bedtime.      SYMBICORT 80-4.5 MCG/ACT inhaler Inhale 2 puffs into the lungs 2 (two) times daily. 10.2 g 11   tamsulosin (FLOMAX) 0.4 MG CAPS capsule Take 1 capsule by mouth at bedtime 30 capsule 0   temazepam (RESTORIL) 15 MG capsule Take 1 capsule by mouth at bedtime 30 capsule 2   TRESIBA FLEXTOUCH 200 UNIT/ML FlexTouch Pen INJECT 80 UNITS SUBCUTANEOUSLY IN THE MORNING 9 mL 0   valsartan (DIOVAN) 80 MG tablet Take 1 tablet by mouth once daily 90 tablet 0   vitamin B-12 (CYANOCOBALAMIN) 1000 MCG tablet Take 1,000 mcg by mouth daily.     No current facility-administered medications on file prior to visit.    ALLERGIES: Nitrofurantoin, Levaquin [levofloxacin in d5w], Brimonidine, Cephalexin, Erythromycin ethylsuccinate, and Oxycodone  SH:  married, non smoker  Review of Systems  Constitutional: Negative.   Genitourinary:        Denies vaginal discharge or bleeding    PHYSICAL EXAMINATION:    BP (!) 115/51 (BP Location: Right Arm, Patient Position: Sitting, Cuff Size: Large)   Pulse 79   Ht 6' (1.829 m) Comment: Reported  Wt 258 lb (117 kg) Comment: Reported  LMP 05/10/1992 Comment: partial  BMI 34.99 kg/m     General appearance: alert, cooperative and appears stated age NLymph:  no inguinal  LAD noted  Pelvic: External genitalia:  no lesions  Urethra:  normal appearing urethra with no masses, tenderness or lesions              Bartholins and Skenes: normal                 Vagina: normal appearing vagina with normal color and discharge, no lesions, estring removed and replaced              Cervix: no lesions              Bimanual Exam:  Uterus:  normal size, contour, position, consistency, mobility, non-tender              Adnexa: no mass, fullness, tenderness  There is a defect between the anus and vagina where stool is present.  With compression from the vagina, more stool is present and the same occurs with pressure from the rectum  Chaperone, Raechel Ache, RN, was present for exam.  Assessment/Plan: 1. Chronic cystitis - Urine Culture - POCT Urinalysis Dipstick - cephALEXin (KEFLEX) 500 MG capsule; Take 1 capsule (500 mg total) by mouth 3 (three) times daily.  Dispense: 21 capsule; Refill: 0  2. Vaginal atrophy - estradiol (ESTRING) 7.5 MCG/24HR vaginal ring; Place 1 each vaginally every 3 (three) months.  Dispense: 1 each; Refill: 3  3. Vulvar itching - terconazole (TERAZOL 7) 0.4 % vaginal cream; Place 1 applicator vaginally at bedtime.  Dispense: 45 g; Refill: 0  4. Rectal fistula - concerning exam for recurrence of fistula.  Requests not to see Dr. Michaell Cowing again. - Ambulatory referral to General Surgery

## 2022-11-12 ENCOUNTER — Other Ambulatory Visit: Payer: Self-pay | Admitting: Urology

## 2022-11-12 DIAGNOSIS — K604 Rectal fistula, unspecified: Secondary | ICD-10-CM | POA: Insufficient documentation

## 2022-11-12 DIAGNOSIS — N3020 Other chronic cystitis without hematuria: Secondary | ICD-10-CM | POA: Insufficient documentation

## 2022-11-12 LAB — URINE CULTURE

## 2022-11-17 ENCOUNTER — Other Ambulatory Visit: Payer: Self-pay | Admitting: Family Medicine

## 2022-11-20 ENCOUNTER — Other Ambulatory Visit: Payer: Self-pay | Admitting: Urology

## 2022-11-25 ENCOUNTER — Encounter (HOSPITAL_COMMUNITY): Payer: Self-pay

## 2022-11-25 ENCOUNTER — Ambulatory Visit (HOSPITAL_COMMUNITY)
Admission: EM | Admit: 2022-11-25 | Discharge: 2022-11-25 | Disposition: A | Discharge status: Home / Self Care | Payer: Medicare PPO | Attending: Family Medicine | Admitting: Family Medicine

## 2022-11-25 DIAGNOSIS — R197 Diarrhea, unspecified: Secondary | ICD-10-CM | POA: Insufficient documentation

## 2022-11-25 DIAGNOSIS — N3020 Other chronic cystitis without hematuria: Secondary | ICD-10-CM | POA: Insufficient documentation

## 2022-11-25 DIAGNOSIS — K219 Gastro-esophageal reflux disease without esophagitis: Secondary | ICD-10-CM | POA: Diagnosis not present

## 2022-11-25 LAB — POCT URINALYSIS DIP (MANUAL ENTRY)
Bilirubin, UA: NEGATIVE
Glucose, UA: NEGATIVE mg/dL
Nitrite, UA: POSITIVE — AB
Protein Ur, POC: 100 mg/dL — AB
Spec Grav, UA: 1.02 (ref 1.010–1.025)
Urobilinogen, UA: 0.2 E.U./dL
pH, UA: 5.5 (ref 5.0–8.0)

## 2022-11-25 MED ORDER — LOPERAMIDE HCL 2 MG PO CAPS: 4.0000 mg | ORAL_CAPSULE | Freq: Three times a day (TID) | ORAL | 0 refills | Status: AC | PRN

## 2022-11-25 MED ORDER — AMOXICILLIN-POT CLAVULANATE 875-125 MG PO TABS: 1.0000 | ORAL_TABLET | Freq: Two times a day (BID) | ORAL | 0 refills | Status: AC

## 2022-11-25 MED ORDER — ONDANSETRON HCL 4 MG PO TABS: 4.0000 mg | ORAL_TABLET | Freq: Three times a day (TID) | ORAL | 0 refills | Status: DC | PRN

## 2022-11-25 NOTE — ED Provider Notes (Signed)
[facility]    CSN: [id number] Arrival date & time: 11/25/22  1208      History   Chief Complaint Chief Complaint  Patient presents with   Diarrhea    HPI [name] is a 82 y.o. female.   HPI Diarrhea and Nausea Patient here with a 2 day history of nausea, vomiting and diarrhea. Reports today symptoms have improve.  Reports a history of recurrent gastritis and this episode feels similar. Denies fever, abdominal pain or weakness. She endorses poor appetite. She has taken otc Pepcid with minimal relief.  UTI, recurrent  Patient endorses a history of recurrent UTI. She is scheduled to see a urologist tomorrow although endorses feeling as if she currently has a UTI now.  Last UTI approximately one month ago and feel that symptoms resolved with treatment, although returned round 3 days ago. Denies chills, fever, however endorses a constant urge to urinate with dysuria. Past Medical History:  Diagnosis Date   Allergy    Anemia    Anxiety    Arthritis    right knee;injections every 6months    Asthma    use daily   Breast cancer (HCC) 1994/1995   HX BREAST CANCER/ right brreast and left breast in 1995   Bronchiectasis (HCC)    COMPRESSION FRACTURE, LUMBAR VERTEBRAE 08/21/2008   Qualifier: Diagnosis of  By: Lovell Sheehan MD, Balinda Quails    Depression    Diabetes mellitus    takes Amaryl and Januvia daily   Diverticulitis    Dyspnea    with activity   Early cataracts, bilateral    Eczema    Endometriosis    Fatty liver    Gastritis    Glaucoma    Hammer toe    History of kidney stones    Hyperlipidemia associated with type 2 diabetes mellitus (HCC) 07/26/2007   Diet/exercise control.     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   Thyroid disease    Vitamin D deficiency    takes Vit d daily   Weakness    uses a walker and wheelchair    Patient Active Problem List   Diagnosis Date Noted   Chronic cystitis 11/12/2022   Rectal fistula 11/12/2022   Chronic rhinitis 09/14/2022   Chronic diastolic heart failure (HCC) 09/02/2021   Right lower quadrant abdominal pain 06/16/2021   Perineal sinus 03/25/2021   Chronic UTI 02/23/2021   Hoarseness 10/17/2020   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 11/22/2019   Physical deconditioning 02/22/2019   Dyspnea on exertion 02/22/2019   Adenomatous polyp 12/20/2018   Pain due to onychomycosis of toenails of both feet 11/08/2018   History of total knee replacement, bilateral 11/14/2017  Personal history of colonic polyps 05/18/2017   Arthritis of midfoot 03/03/2016   Hammertoes of both feet 03/03/2016   Aortic atherosclerosis (HCC) 02/16/2016   Solitary pulmonary nodule 01/08/2016   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   Bronchiectasis (HCC) 10/18/2015   IBS (irritable bowel syndrome) 07/17/2014   Recurrent major depression in full remission (HCC) 01/03/2014   Posterior tibial tendon dysfunction 01/03/2014   Osteoarthritis, knee 01/03/2014   Glaucoma 01/03/2014   Essential hypertension 01/03/2014   Obesity (BMI 30-39.9) 06/15/2013   Primary open-angle glaucoma 08/02/2012   Foot tendinitis 07/20/2010   INSOMNIA, CHRONIC 07/25/2009   Fatty liver 03/18/2009   GERD 12/25/2007   Hyperlipidemia 07/26/2007   ANEMIA, B12 DEFICIENCY 12/29/2006   Poorly controlled diabetes mellitus (HCC) 12/28/2006    RESTLESS LEG SYNDROME, MILD 12/28/2006   Idiopathic peripheral neuropathy 12/28/2006   Osteoporosis 12/12/2006   BREAST CANCER, HX OF 12/12/2006    Past Surgical History:  Procedure Laterality Date   ADENOIDECTOMY     at age 28   ANAL FISTULOTOMY N/A 03/07/2020   Procedure: ANAL FISTULOTOMY WITH MARSUPIALIZATION;  Surgeon: Karie Soda, MD;  Location: [facility];  Service: General;  Laterality: N/A;   BIOPSY  12/19/2018   Procedure: BIOPSY;  Surgeon: Napoleon Form, MD;  Location: [facility];  Service: Endoscopy;;   CATARACT EXTRACTION     CHOLECYSTECTOMY  06/2008   COLONOSCOPY WITH PROPOFOL N/A 05/17/2017   Procedure: COLONOSCOPY WITH PROPOFOL;  Surgeon: Napoleon Form, MD;  Location: [facility];  Service: Endoscopy;  Laterality: N/A;  PT WILL BE ADMITTED THE DAY BEFORE FOR PREP PER ROBIN KB   COLONOSCOPY WITH PROPOFOL N/A 12/19/2018   Procedure: COLONOSCOPY WITH PROPOFOL;  Surgeon: Napoleon Form, MD;  Location: [facility];  Service: Endoscopy;  Laterality: N/A;   DILATION AND CURETTAGE OF UTERUS     excision on breast     internal infected suture from breast surgery   excision removed from neck  2008   infected lymph node   EXTRACORPOREAL SHOCK WAVE LITHOTRIPSY Right 07/20/2021   Procedure: EXTRACORPOREAL SHOCK WAVE LITHOTRIPSY (ESWL);  Surgeon: Crist Fat, MD;  Location: [facility];  Service: Urology;  Laterality: Right;   EXTRACORPOREAL SHOCK WAVE LITHOTRIPSY Right 10/15/2021   Procedure: EXTRACORPOREAL SHOCK WAVE LITHOTRIPSY (ESWL);  Surgeon: Noel Christmas, MD;  Location: [facility];  Service: Urology;  Laterality: Right;   HEMORRHOID SURGERY N/A 11/22/2019   Procedure: HEMORRHOIDECTOMY LIGATION , PEXY;  Surgeon: Karie Soda, MD;  Location: [facility];  Service: General;  Laterality: N/A;   HYPERBARIC OXYGEN THERAPY     FEET INFECTION   HYSTEROSCOPY  06/22/2011   PMB submucosal  myoma   JOINT REPLACEMENT  2014   left total shoulder   LAPAROSCOPIC OOPHORECTOMY Right 12/2004   absent LSO   LAPAROTOMY  1977   LIGATION OF INTERNAL FISTULA TRACT N/A 11/22/2019   Procedure: REPAIR OF PERIRECTAL FISTULA, ANORECTAL EXAMINATION UNDER ANESTHESIA;  Surgeon: Karie Soda, MD;  Location: [facility] ;  Service: General;  Laterality: N/A;   MASTECTOMY Bilateral 1994 right, 1995 left   PLACEMENT OF SETON N/A 03/19/2021   Procedure: LIMBERG RHOMBOID ROTATIONAL FLAP CLOSURE;  Surgeon: Karie Soda, MD;  Location: [facility];  Service: General;  Laterality: N/A;   POLYPECTOMY  12/19/2018   Procedure: POLYPECTOMY;  Surgeon: Napoleon Form, MD;  Location: [facility];  Service: Endoscopy;;   RECTAL EXAM UNDER ANESTHESIA N/A 03/07/2020   Procedure: ANORECTAL EXAM UNDER  ANESTHESIA;  Surgeon: Karie Soda, MD;  Location: [facility];  Service: General;  Laterality: N/A;   RECTAL EXAM UNDER ANESTHESIA N/A 03/19/2021   Procedure: RECTAL EXAM UNDER ANESTHESIA;  Surgeon: Karie Soda, MD;  Location: [facility];  Service: General;  Laterality: N/A;   SHOULDER HEMI-ARTHROPLASTY  01/01/2012   Procedure: SHOULDER HEMI-ARTHROPLASTY;  Surgeon: Dominica Severin, MD;  Location: [facility];  Service: Orthopedics;  Laterality: Left;  Left Shoulder Hemi Arthroplasty with Repair and Reconstruction as Necessary    THYROIDECTOMY  1975   67yrs ago. follows endocrine   TONSILLECTOMY     as a child   TOTAL KNEE ARTHROPLASTY Right 12/30/2014   Procedure: RIGHT TOTAL KNEE ARTHROPLASTY;  Surgeon: Ollen Gross, MD;  Location: [facility];  Service: Orthopedics;  Laterality: Right;   TOTAL KNEE ARTHROPLASTY Left 11/29/2016   Procedure: LEFT TOTAL KNEE ARTHROPLASTY;  Surgeon: Ollen Gross, MD;  Location: [facility];  Service: Orthopedics;  Laterality: Left;    OB History     Gravida  0   Para      Term      Preterm      AB      Living  0      SAB      IAB      Ectopic       Multiple      Live Births           Obstetric Comments  1 adopted          Home Medications    Prior to Admission medications   Medication Sig Start Date End Date Taking? Authorizing Provider  amoxicillin-clavulanate (AUGMENTIN) 875-125 MG tablet Take 1 tablet by mouth every 12 (twelve) hours for 7 days. 11/25/22 12/02/22 Yes Bing Neighbors, NP  loperamide (IMODIUM) 2 MG capsule Take 2 capsules (4 mg total) by mouth 3 (three) times daily as needed for diarrhea or loose stools. 11/25/22  Yes Bing Neighbors, NP  ondansetron (ZOFRAN) 4 MG tablet Take 1 tablet (4 mg total) by mouth every 8 (eight) hours as needed for nausea or vomiting. 11/25/22  Yes Bing Neighbors, NP  ACCU-CHEK GUIDE test strip USE 1 STRIP ONCE DAILY TO  CHECK  GLUCOSE 09/27/22   Shelva Majestic, MD  albuterol (VENTOLIN HFA) 108 (90 Base) MCG/ACT inhaler Inhale 2 puffs into the lungs every 6 (six) hours as needed for wheezing or shortness of breath. 09/14/22   Parrett, Virgel Bouquet, NP  atenolol (TENORMIN) 25 MG tablet Take 1 tablet by mouth once daily 09/20/22   Shelva Majestic, MD  Bacillus Coagulans-Inulin (ALIGN PREBIOTIC-PROBIOTIC PO) Take 1 capsule by mouth daily.     [provider]  BD PEN NEEDLE NANO 2ND GEN 32G X 4 MM MISC USE AS DIRECTED DAILY 08/16/22   Shelva Majestic, MD  cetirizine (ZYRTEC) 10 MG tablet Take 10 mg by mouth every morning.     [provider]  Cholecalciferol (VITAMIN D3) 125 MCG (5000 UT) TABS Take 5,000 Units by mouth daily.    [provider]  Coenzyme Q10 (COQ10) 200 MG CAPS Take 200 mg by mouth daily.    [provider]  desvenlafaxine (PRISTIQ) 50 MG 24 hr tablet Take 1 tablet (50 mg total) by mouth daily. 05/04/22   Shelva Majestic, MD  dicyclomine (BENTYL) 20 MG tablet TAKE 1 TABLET BY MOUTH 4 TIMES DAILY (MORNING,  NOON,  EVENING,  AND  BEDTIME) 11/08/22   Marsa Aris  V, MD  dorzolamide-timolol (COSOPT) 22.3-6.8 MG/ML ophthalmic  solution Place 1 drop into both eyes 2 (two) times daily.  02/08/13   [provider]  estradiol (ESTRING) 7.5 MCG/24HR vaginal ring Place 1 each vaginally every 3 (three) months. 11/09/22   Jerene Bears, MD  FIBER PO Take 2 tablets by mouth every evening.    [provider]  fluticasone (FLONASE) 50 MCG/ACT nasal spray Use 1 spray(s) in each nostril once daily 01/18/22   Byrum, Les Pou, MD  gabapentin (NEURONTIN) 100 MG capsule TAKE 1 CAPSULE BY MOUTH THREE TIMES DAILY AS NEEDED FOR  NEUROPATIC  PAIN. 11/08/22   Shelva Majestic, MD  glimepiride (AMARYL) 4 MG tablet TAKE 2 TABLETS BY MOUTH ONCE DAILY WITH BREAKFAST 10/18/22   Shelva Majestic, MD  Lancets Misc. (ACCU-CHEK FASTCLIX LANCET) KIT Use to test blood sugars daily. Dx: E11.9 08/23/19   Shelva Majestic, MD  latanoprost (XALATAN) 0.005 % ophthalmic solution Place 1 drop into the right eye at bedtime.  05/20/18   [provider]  lovastatin (MEVACOR) 10 MG tablet Take 1 tablet by mouth once daily 09/13/22   Shelva Majestic, MD  methenamine (HIPREX) 1 g tablet Take 1 tablet (1 g total) by mouth 2 (two) times daily with a meal. 01/27/22   Shelva Majestic, MD  montelukast (SINGULAIR) 10 MG tablet TAKE 1 TABLET BY MOUTH AT BEDTIME . APPOINTMENT REQUIRED FOR FUTURE REFILLS 09/14/22   Parrett, Virgel Bouquet, NP  Multiple Vitamin (MULTIVITAMIN WITH MINERALS) TABS tablet Take 1 tablet by mouth daily. 3 Fruit Capsules 3 Vegetable Capsules - Balance of Nature 03/26/21   Azucena Fallen, MD  MYRBETRIQ 50 MG TB24 tablet Take 1 tablet by mouth once daily 11/16/22   McKenzie, Mardene Celeste, MD  OZEMPIC, 1 MG/DOSE, 4 MG/3ML SOPN INJECT 1MG  SUBCUTANEOUSLY ONCE A WEEK AS DIRECTED 11/02/22   Shelva Majestic, MD  pramipexole (MIRAPEX) 0.5 MG tablet TAKE 1 TABLET BY MOUTH AT BEDTIME 10/11/22   Shelva Majestic, MD  Selenium 200 MCG CAPS Take 200 mcg by mouth at bedtime.     [provider]  SYMBICORT 80-4.5 MCG/ACT inhaler Inhale 2 puffs  into the lungs 2 (two) times daily. 09/14/22   Parrett, Virgel Bouquet, NP  tamsulosin (FLOMAX) 0.4 MG CAPS capsule Take 1 capsule by mouth at bedtime 11/23/22   McKenzie, Mardene Celeste, MD  temazepam (RESTORIL) 15 MG capsule Take 1 capsule by mouth at bedtime 09/29/22   Shelva Majestic, MD  terconazole (TERAZOL 7) 0.4 % vaginal cream Place 1 applicator vaginally at bedtime. 11/09/22   Jerene Bears, MD  TRESIBA FLEXTOUCH 200 UNIT/ML FlexTouch Pen INJECT 80 UNITS SUBCUTANEOUSLY IN THE MORNING 11/17/22   Shelva Majestic, MD  valsartan (DIOVAN) 80 MG tablet Take 1 tablet by mouth once daily 09/13/22   Shelva Majestic, MD  vitamin B-12 (CYANOCOBALAMIN) 1000 MCG tablet Take 1,000 mcg by mouth daily.    [provider]    Family History Family History  Problem Relation Age of Onset   Arthritis Mother    COPD Father    Heart disease Father        MI 54 - states he died of lockjaw   Hypertension Father    Hyperlipidemia Father    Pancreatic cancer Paternal Grandfather    Colon cancer Neg Hx    Esophageal cancer Neg Hx    Rectal cancer Neg Hx    Stomach cancer Neg Hx  Social History Social History   Tobacco Use   Smoking status: Never   Smokeless tobacco: Never  Vaping Use   Vaping status: Never Used  Substance Use Topics   Alcohol use: No   Drug use: No     Allergies   Nitrofurantoin, Levaquin [levofloxacin in d5w], Brimonidine, Cephalexin, Erythromycin ethylsuccinate, and Oxycodone   Review of Systems Review of Systems Pertinent negatives listed in HPI   Physical Exam Triage Vital Signs ED Triage Vitals  Encounter Vitals Group     BP 11/25/22 1231 98/60     Systolic BP Percentile --      Diastolic BP Percentile --      Pulse Rate 11/25/22 1231 100     Resp 11/25/22 1231 16     Temp 11/25/22 1231 98.2 F (36.8 C)     Temp Source 11/25/22 1231 Oral     SpO2 11/25/22 1231 93 %     Weight --      Height --      Head Circumference --      Peak Flow --      Pain  Score 11/25/22 1232 0     Pain Loc --      Pain Education --      Exclude from Growth Chart --    No data found.  Updated Vital Signs BP 98/60 (BP Location: Left Arm)   Pulse 100   Temp 98.2 F (36.8 C) (Oral)   Resp 16   LMP 05/10/1992 Comment: partial  SpO2 93%   Visual Acuity Right Eye Distance:   Left Eye Distance:   Bilateral Distance:    Right Eye Near:   Left Eye Near:    Bilateral Near:     Physical Exam Vitals reviewed.  Constitutional:      Appearance: She is obese. She is not diaphoretic.     Comments: Chronically ill appearing   HENT:     Head: Normocephalic and atraumatic.     Mouth/Throat:     Mouth: Mucous membranes are moist.     Pharynx: No oropharyngeal exudate or posterior oropharyngeal erythema.  Eyes:     General: Scleral icterus present.     Extraocular Movements: Extraocular movements intact.     Pupils: Pupils are equal, round, and reactive to light.  Cardiovascular:     Rate and Rhythm: Normal rate and regular rhythm.  Pulmonary:     Effort: Pulmonary effort is normal.     Breath sounds: Normal breath sounds.  Abdominal:     General: Bowel sounds are increased.     Tenderness: There is abdominal tenderness in the right lower quadrant, suprapubic area and left lower quadrant. There is no right CVA tenderness, left CVA tenderness, guarding or rebound.  Musculoskeletal:        General: Normal range of motion.     Cervical back: Normal range of motion and neck supple.  Skin:    General: Skin is warm and dry.  Neurological:     General: No focal deficit present.     Mental Status: She is alert.      UC Treatments / Results  Labs (all labs ordered are listed, but only abnormal results are displayed) Labs Reviewed  POCT URINALYSIS DIP (MANUAL ENTRY) - Abnormal; Notable for the following components:      Result Value   Clarity, UA cloudy (*)    Ketones, POC UA small (15) (*)    Blood, UA moderate (*)  Protein Ur, POC =100 (*)     Nitrite, UA Positive (*)    Leukocytes, UA Small (1+) (*)    All other components within normal limits  URINE CULTURE    EKG   Radiology No results found.  Procedures Procedures (including critical care time)  Medications Ordered in UC Medications - No data to display  Initial Impression / Assessment and Plan / UC Course  I have reviewed the triage vital signs and the nursing notes.  Pertinent labs & imaging results that were available during my care of the patient were reviewed by me and considered in my medical decision making (see chart for details).    Suspect GERD and review of UA indicates a likely UTi. Based on prior urine culture will cover empirically with Augmentin x 7 days. Symptom management of nausea and diarrhea per discharge medication orders. Strict ED precautions given Urine culture pending. Patient verbalized understanding and agreement with plan. Final Clinical Impressions(s) / UC Diagnoses   Final diagnoses:  Gastroesophageal reflux disease without esophagitis  Diarrhea in adult patient  Chronic cystitis     Discharge Instructions      You have a UTI today. I am prescribing you Augmentin based on your last urine culture report. Take twice daily for 7 days. Keep follow-up with urologist.  For nausea take Zofran 4 mg every 8 hours as needed for nausea and/or vomiting.  I have also prescribed you loperamide you may take 4 mg 3 times daily if loose stools persist.   ED Prescriptions     Medication Sig Dispense Auth. Provider   amoxicillin-clavulanate (AUGMENTIN) 875-125 MG tablet Take 1 tablet by mouth every 12 (twelve) hours for 7 days. 14 tablet Bing Neighbors, NP   loperamide (IMODIUM) 2 MG capsule Take 2 capsules (4 mg total) by mouth 3 (three) times daily as needed for diarrhea or loose stools. 12 capsule Bing Neighbors, NP   ondansetron (ZOFRAN) 4 MG tablet Take 1 tablet (4 mg total) by mouth every 8 (eight) hours as needed for nausea or  vomiting. 20 tablet Bing Neighbors, NP      PDMP not reviewed this encounter.   Bing Neighbors, NP 11/29/22 1731

## 2022-11-25 NOTE — Discharge Instructions (Addendum)
You have a UTI today. I am prescribing you Augmentin based on your last urine culture report. Take twice daily for 7 days. Keep follow-up with urologist.  For nausea take Zofran 4 mg every 8 hours as needed for nausea and/or vomiting.  I have also prescribed you loperamide you may take 4 mg 3 times daily if loose stools persist.

## 2022-11-25 NOTE — ED Triage Notes (Signed)
Pt states N/V/D for the past 2 days. 

## 2022-11-26 DIAGNOSIS — R8271 Bacteriuria: Secondary | ICD-10-CM | POA: Diagnosis not present

## 2022-11-26 DIAGNOSIS — N200 Calculus of kidney: Secondary | ICD-10-CM | POA: Diagnosis not present

## 2022-11-26 DIAGNOSIS — N3941 Urge incontinence: Secondary | ICD-10-CM | POA: Diagnosis not present

## 2022-11-26 DIAGNOSIS — N3020 Other chronic cystitis without hematuria: Secondary | ICD-10-CM | POA: Diagnosis not present

## 2022-11-26 LAB — URINE CULTURE

## 2022-11-27 LAB — URINE CULTURE
Culture: >=100000 — AB
Special Requests: NORMAL

## 2022-11-29 ENCOUNTER — Other Ambulatory Visit: Payer: Self-pay | Admitting: Family Medicine

## 2022-12-06 ENCOUNTER — Other Ambulatory Visit: Payer: Self-pay | Admitting: Family Medicine

## 2022-12-08 ENCOUNTER — Encounter (INDEPENDENT_AMBULATORY_CARE_PROVIDER_SITE_OTHER): Payer: Self-pay

## 2022-12-18 ENCOUNTER — Other Ambulatory Visit: Payer: Self-pay | Admitting: Urology

## 2022-12-18 ENCOUNTER — Other Ambulatory Visit: Payer: Self-pay | Admitting: Family Medicine

## 2022-12-24 ENCOUNTER — Encounter: Payer: Self-pay | Admitting: Family Medicine

## 2022-12-24 ENCOUNTER — Ambulatory Visit: Payer: Medicare PPO | Admitting: Family Medicine

## 2022-12-24 ENCOUNTER — Other Ambulatory Visit: Payer: Self-pay

## 2022-12-24 VITALS — BP 122/60 | HR 89 | Temp 97.7°F | Ht 72.0 in | Wt 259.0 lb

## 2022-12-24 DIAGNOSIS — E782 Mixed hyperlipidemia: Secondary | ICD-10-CM | POA: Diagnosis not present

## 2022-12-24 DIAGNOSIS — Z7985 Long-term (current) use of injectable non-insulin antidiabetic drugs: Secondary | ICD-10-CM | POA: Diagnosis not present

## 2022-12-24 DIAGNOSIS — I10 Essential (primary) hypertension: Secondary | ICD-10-CM | POA: Diagnosis not present

## 2022-12-24 DIAGNOSIS — E1165 Type 2 diabetes mellitus with hyperglycemia: Secondary | ICD-10-CM | POA: Diagnosis not present

## 2022-12-24 DIAGNOSIS — N390 Urinary tract infection, site not specified: Secondary | ICD-10-CM | POA: Diagnosis not present

## 2022-12-24 LAB — POC URINALSYSI DIPSTICK (AUTOMATED)
Bilirubin, UA: NEGATIVE
Glucose, UA: NEGATIVE
Ketones, UA: NEGATIVE
Leukocytes, UA: NEGATIVE
Nitrite, UA: POSITIVE
Protein, UA: NEGATIVE
Spec Grav, UA: >=1.03 — AB (ref 1.010–1.025)
Urobilinogen, UA: 0.2 E.U./dL
pH, UA: 5 (ref 5.0–8.0)

## 2022-12-24 LAB — COMPREHENSIVE METABOLIC PANEL
ALT: 12 U/L (ref 0–35)
AST: 12 U/L (ref 0–37)
Albumin: 3.7 g/dL (ref 3.5–5.2)
Alkaline Phosphatase: 68 U/L (ref 39–117)
BUN: 19 mg/dL (ref 6–23)
CO2: 31 meq/L (ref 19–32)
Calcium: 9.4 mg/dL (ref 8.4–10.5)
Chloride: 98 meq/L (ref 96–112)
Creatinine, Ser: 0.61 mg/dL (ref 0.40–1.20)
GFR: 82.95 mL/min (ref >60.00)
Glucose, Bld: 170 mg/dL — ABNORMAL HIGH (ref 70–99)
Potassium: 4 meq/L (ref 3.5–5.1)
Sodium: 135 meq/L (ref 135–145)
Total Bilirubin: 0.3 mg/dL (ref 0.2–1.2)
Total Protein: 6.5 g/dL (ref 6.0–8.3)

## 2022-12-24 LAB — LIPID PANEL
Cholesterol: 112 mg/dL (ref 0–200)
HDL: 34.7 mg/dL — ABNORMAL LOW (ref >39.00)
LDL Cholesterol: 58 mg/dL (ref 0–99)
NonHDL: 77.54
Total CHOL/HDL Ratio: 3
Triglycerides: 97 mg/dL (ref 0.0–149.0)
VLDL: 19.4 mg/dL (ref 0.0–40.0)

## 2022-12-24 NOTE — Addendum Note (Signed)
Addended by: Gwenette Greet on: 12/24/2022 02:41 PM   Modules accepted: Orders

## 2022-12-24 NOTE — Addendum Note (Signed)
Addended by: Shelva Majestic on: 12/24/2022 05:50 PM   Modules accepted: Level of Service

## 2022-12-24 NOTE — Patient Instructions (Addendum)
Let us know if you get a flu shot this fall.  Have diabetic eye exam sent to [name] when you have it done.  Please stop by lab before you go If you have mychart- we will send your results within 3 business days of [name] receiving them.  If you do not have mychart- we will call you about results within 5 business days of [name] receiving them.  *please also note that you will see labs on mychart as soon as they post. I will later go in and write notes on them- will say "notes from Dr. Durene Cal"   #neuropathy- not sure gabapentin helpful twice a day- she is going to try once a day for a week then stop- salonpas seems to be just as helpful and lower risk  Recommended follow up: Return in about 4 months (around 04/25/2023) for followup or sooner if needed.Schedule b4 you leave.

## 2022-12-24 NOTE — Addendum Note (Signed)
Addended by: Lorn Junes on: 12/24/2022 03:41 PM   Modules accepted: Orders

## 2022-12-24 NOTE — Progress Notes (Signed)
Phone [phone] In person visit   Subjective:   [name] is a 82 y.o. year old very pleasant female patient who presents for/with See problem oriented charting Chief Complaint  Patient presents with   Medical Management of Chronic Issues   Diabetes   Hypertension   anal fistula   Urinary Tract Infection    Wants you to look at meds urology gave her   Cerumen Impaction    Ear lavage unsuccessful, discussed mineral oil/going back to ENT   Past Medical History-  Patient Active Problem List   Diagnosis Date Noted   Perineal sinus 03/25/2021    Priority: High   Chronic UTI 02/23/2021    Priority: High   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 11/22/2019    Priority: High   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    Priority: High   Bronchiectasis (HCC) 10/18/2015    Priority: High   Recurrent major depression in full remission (HCC) 01/03/2014    Priority: High   Poorly controlled diabetes mellitus (HCC) 12/28/2006    Priority: High   Dyspnea on exertion 02/22/2019    Priority: Medium    Adenomatous polyp 12/20/2018    Priority: Medium    Personal history of colonic polyps 05/18/2017    Priority: Medium    Aortic atherosclerosis (HCC) 02/16/2016    Priority: Medium    Solitary pulmonary nodule 01/08/2016    Priority: Medium    IBS (irritable bowel syndrome) 07/17/2014    Priority: Medium    Essential hypertension 01/03/2014    Priority: Medium    Primary open-angle glaucoma 08/02/2012    Priority: Medium    Fatty liver 03/18/2009    Priority: Medium    Hyperlipidemia 07/26/2007    Priority: Medium    ANEMIA, B12 DEFICIENCY 12/29/2006    Priority: Medium    RESTLESS LEG SYNDROME, MILD 12/28/2006    Priority: Medium    Osteoporosis 12/12/2006    Priority: Medium    Pain due to onychomycosis of toenails of both feet 11/08/2018    Priority: Low   History of total knee replacement, bilateral 11/14/2017    Priority: Low   Arthritis of midfoot  03/03/2016    Priority: Low   Hammertoes of both feet 03/03/2016    Priority: Low   Posterior tibial tendon dysfunction 01/03/2014    Priority: Low   Osteoarthritis, knee 01/03/2014    Priority: Low   Glaucoma 01/03/2014    Priority: Low   Obesity (BMI 30-39.9) 06/15/2013    Priority: Low   Foot tendinitis 07/20/2010    Priority: Low   INSOMNIA, CHRONIC 07/25/2009    Priority: Low   GERD 12/25/2007    Priority: Low   Idiopathic peripheral neuropathy 12/28/2006    Priority: Low   BREAST CANCER, HX OF 12/12/2006    Priority: Low   Chronic cystitis 11/12/2022   Rectal fistula 11/12/2022   Chronic rhinitis 09/14/2022   Chronic diastolic heart failure (HCC) 09/02/2021   Right lower quadrant abdominal pain 06/16/2021   Hoarseness 10/17/2020   Physical deconditioning 02/22/2019    Medications- reviewed and updated Current Outpatient Medications  Medication Sig Dispense Refill   ACCU-CHEK GUIDE test strip USE 1 STRIP ONCE DAILY TO  CHECK  GLUCOSE 100 each 0   albuterol (VENTOLIN HFA) 108 (90 Base) MCG/ACT inhaler Inhale 2 puffs into the lungs every 6 (six) hours as needed for wheezing or shortness of breath. 8 g 6   atenolol (TENORMIN) 25 MG tablet Take 1 tablet  by mouth once daily 90 tablet 0   Bacillus Coagulans-Inulin (ALIGN PREBIOTIC-PROBIOTIC PO) Take 1 capsule by mouth daily.      BD PEN NEEDLE NANO 2ND GEN 32G X 4 MM MISC USE AS DIRECTED DAILY 100 each 0   cetirizine (ZYRTEC) 10 MG tablet Take 10 mg by mouth every morning.      Cholecalciferol (VITAMIN D3) 125 MCG (5000 UT) TABS Take 5,000 Units by mouth daily.     Coenzyme Q10 (COQ10) 200 MG CAPS Take 200 mg by mouth daily.     desvenlafaxine (PRISTIQ) 50 MG 24 hr tablet Take 1 tablet (50 mg total) by mouth daily. 90 tablet 3   dicyclomine (BENTYL) 20 MG tablet TAKE 1 TABLET BY MOUTH 4 TIMES DAILY (MORNING,  NOON,  EVENING,  AND  BEDTIME) 120 tablet 0   dorzolamide-timolol (COSOPT) 22.3-6.8 MG/ML ophthalmic solution Place  1 drop into both eyes 2 (two) times daily.      estradiol (ESTRING) 7.5 MCG/24HR vaginal ring Place 1 each vaginally every 3 (three) months. 1 each 3   FIBER PO Take 2 tablets by mouth every evening.     fluticasone (FLONASE) 50 MCG/ACT nasal spray Use 1 spray(s) in each nostril once daily 16 g 11   gabapentin (NEURONTIN) 100 MG capsule TAKE 1 CAPSULE BY MOUTH THREE TIMES DAILY AS NEEDED FOR  NEUROPATIC  PAIN. 270 capsule 0   glimepiride (AMARYL) 4 MG tablet TAKE 2 TABLETS BY MOUTH ONCE DAILY WITH BREAKFAST 180 tablet 0   Lancets Misc. (ACCU-CHEK FASTCLIX LANCET) KIT Use to test blood sugars daily. Dx: E11.9 1 kit 5   latanoprost (XALATAN) 0.005 % ophthalmic solution Place 1 drop into the right eye at bedtime.      loperamide (IMODIUM) 2 MG capsule Take 2 capsules (4 mg total) by mouth 3 (three) times daily as needed for diarrhea or loose stools. 12 capsule 0   lovastatin (MEVACOR) 10 MG tablet Take 1 tablet by mouth once daily 90 tablet 0   methenamine (HIPREX) 1 g tablet Take 1 tablet (1 g total) by mouth 2 (two) times daily with a meal. 60 tablet 11   montelukast (SINGULAIR) 10 MG tablet TAKE 1 TABLET BY MOUTH AT BEDTIME . APPOINTMENT REQUIRED FOR FUTURE REFILLS 30 tablet 11   Multiple Vitamin (MULTIVITAMIN WITH MINERALS) TABS tablet Take 1 tablet by mouth daily. 3 Fruit Capsules 3 Vegetable Capsules - Balance of Nature 30 tablet 0   MYRBETRIQ 50 MG TB24 tablet Take 1 tablet by mouth once daily 30 tablet 0   ondansetron (ZOFRAN) 4 MG tablet Take 1 tablet (4 mg total) by mouth every 8 (eight) hours as needed for nausea or vomiting. 20 tablet 0   OZEMPIC, 1 MG/DOSE, 4 MG/3ML SOPN INJECT 1 MG  SUBCUTANEOUSLY ONCE A WEEK 3 mL 0   pramipexole (MIRAPEX) 0.5 MG tablet TAKE 1 TABLET BY MOUTH AT BEDTIME 90 tablet 0   Selenium 200 MCG CAPS Take 200 mcg by mouth at bedtime.      SYMBICORT 80-4.5 MCG/ACT inhaler Inhale 2 puffs into the lungs 2 (two) times daily. 10.2 g 11   tamsulosin (FLOMAX) 0.4 MG CAPS  capsule Take 1 capsule by mouth at bedtime 30 capsule 0   temazepam (RESTORIL) 15 MG capsule Take 1 capsule by mouth at bedtime 30 capsule 2   terconazole (TERAZOL 7) 0.4 % vaginal cream Place 1 applicator vaginally at bedtime. 45 g 0   TRESIBA FLEXTOUCH 200 UNIT/ML FlexTouch Pen INJECT 80  UNITS SUBCUTANEOUSLY IN THE MORNING 9 mL 0   valsartan (DIOVAN) 80 MG tablet Take 1 tablet by mouth once daily 90 tablet 0   vitamin B-12 (CYANOCOBALAMIN) 1000 MCG tablet Take 1,000 mcg by mouth daily.     No current facility-administered medications for this visit.     Objective:  BP 122/60   Pulse 89   Temp 97.7 F (36.5 C)   Ht 6' (1.829 m)   Wt 259 lb (117.5 kg)   LMP 05/10/1992 Comment: partial  SpO2 95%   BMI 35.13 kg/m  Gen: NAD, resting comfortably CV: RRR no murmurs rubs or gallops Lungs: CTAB no crackles, wheeze, rhonchi Ext: minimal  edema Skin: warm, dry Neuro: inwheelchair    Assessment and Plan   #New fistula- working with Dr. Michaell Cowing previously (3 prior procedures) and has another this coming  with Dr. Cliffton Asters- between her vagina and retum  # Concern about recurrent UTIs and has seen urology with Dr. Willette Brace on methenamine 1 g twice daily with meals- also on vitamin C, probiotic and estrogen cream -also on Myrbetriq for overactive bladder as well as Flomax -just had urinary tract infection treated 11/25/22 as well in ED  # Cerumen impaction-team attempted irrigation with limited success- has ENT visit within 2 weeks   # Diabetes S: Medication: Ozempic 1 mg and Tresiba 90 units every morning - weight up slightly -90's in the mornings but no low blood sugars Lab Results  Component Value Date   HGBA1C 7.6 (H) 08/24/2022   HGBA1C 8.3 (H) 04/27/2022   HGBA1C 9.1 (H) 12/31/2021  A/P: a1c slightly high last visit- update today- hoping improved   #hypertension S: medication: Atenolol 25 mg, valsartan 80 mg BP Readings from Last 3 Encounters:  12/24/22 122/60  11/25/22  98/60  11/09/22 (!) 115/51  A/P: stable- continue current medicines   #hyperlipidemia S: Medication: Lovastatin 10 mg Lab Results  Component Value Date   CHOL 111 12/31/2021   HDL 43.60 12/31/2021   LDLCALC 50 12/31/2021   LDLDIRECT 77.0 02/16/2016   TRIG 88.0 12/31/2021   CHOLHDL 3 12/31/2021   A/P: hopefully stable- update lipid panel today. Continue current meds for now    # Depression S: Medication: Pristiq 50 mg    12/24/2022    1:28 PM 11/09/2022    2:27 PM 08/24/2022    1:13 PM  Depression screen PHQ 2/9  Decreased Interest 0 0 0  Down, Depressed, Hopeless 0 0 0  PHQ - 2 Score 0 0 0  Altered sleeping 0  0  Tired, decreased energy 2  0  Change in appetite 0  0  Feeling bad or failure about yourself  0  0  Trouble concentrating 0  0  Moving slowly or fidgety/restless 0  0  Suicidal thoughts 0  0  PHQ-9 Score 2  0  Difficult doing work/chores Not difficult at all  Not difficult at all  A/P: full remission despite stressors- continue current medications    #neuropathy- not sure gabapentin helpful twice a day- she is going to try once a day for a week then stop- salonpas seems to be just as helpful and lower risk   Recommended follow up: Return in about 4 months (around 04/25/2023) for followup or sooner if needed.Schedule b4 you leave. Future Appointments  Date Time Provider Department Center  02/14/2023  2:55 PM Jerene Bears, MD [facility]  03/29/2023 10:40 AM Napoleon Form, MD [facility]  09/15/2023  2:30 PM  Parrett, Tammy S, NP [facility] None    Lab/Order associations:   ICD-10-CM   1. Poorly controlled diabetes mellitus (HCC)  E11.65 Urine Microalbumin w/creat. ratio    Comprehensive metabolic panel    Hemoglobin A1c    2. Essential hypertension  I10 Comprehensive metabolic panel    3. Mixed hyperlipidemia  E78.2 Lipid panel      No orders of the defined types were placed in this encounter.   Return precautions advised.  Tana Conch, MD

## 2022-12-25 ENCOUNTER — Other Ambulatory Visit: Payer: Self-pay | Admitting: Family Medicine

## 2022-12-25 LAB — MICROALBUMIN / CREATININE URINE RATIO
Creatinine, Urine: 110 mg/dL (ref 20–275)
Microalb Creat Ratio: 29 mg/g{creat} (ref <30)
Microalb, Ur: 3.2 mg/dL

## 2022-12-26 LAB — URINE CULTURE
MICRO NUMBER:: 15342055
SPECIMEN QUALITY:: ADEQUATE

## 2022-12-27 ENCOUNTER — Other Ambulatory Visit: Payer: Self-pay

## 2022-12-27 ENCOUNTER — Other Ambulatory Visit: Payer: Self-pay | Admitting: Family Medicine

## 2022-12-27 LAB — HEMOGLOBIN A1C: Hgb A1c MFr Bld: 7.3 % — ABNORMAL HIGH (ref 4.6–6.5)

## 2022-12-27 MED ORDER — AMOXICILLIN-POT CLAVULANATE 875-125 MG PO TABS: 1.0000 | ORAL_TABLET | Freq: Two times a day (BID) | ORAL | 0 refills | Status: AC

## 2022-12-27 MED ORDER — ACCU-CHEK GUIDE VI STRP: ORAL_STRIP | 3 refills | Status: DC

## 2022-12-29 DIAGNOSIS — H401112 Primary open-angle glaucoma, right eye, moderate stage: Secondary | ICD-10-CM | POA: Diagnosis not present

## 2022-12-29 DIAGNOSIS — H401123 Primary open-angle glaucoma, left eye, severe stage: Secondary | ICD-10-CM | POA: Diagnosis not present

## 2023-01-01 ENCOUNTER — Other Ambulatory Visit: Payer: Self-pay | Admitting: Gastroenterology

## 2023-01-01 ENCOUNTER — Other Ambulatory Visit: Payer: Self-pay | Admitting: Family Medicine

## 2023-01-06 DIAGNOSIS — R0981 Nasal congestion: Secondary | ICD-10-CM | POA: Diagnosis not present

## 2023-01-06 DIAGNOSIS — H6123 Impacted cerumen, bilateral: Secondary | ICD-10-CM | POA: Diagnosis not present

## 2023-01-06 DIAGNOSIS — H7291 Unspecified perforation of tympanic membrane, right ear: Secondary | ICD-10-CM | POA: Diagnosis not present

## 2023-01-06 DIAGNOSIS — R490 Dysphonia: Secondary | ICD-10-CM | POA: Diagnosis not present

## 2023-01-12 ENCOUNTER — Encounter: Payer: Self-pay | Admitting: Family Medicine

## 2023-01-13 ENCOUNTER — Other Ambulatory Visit: Payer: Self-pay

## 2023-01-13 MED ORDER — AMOXICILLIN-POT CLAVULANATE 875-125 MG PO TABS: 1.0000 | ORAL_TABLET | Freq: Two times a day (BID) | ORAL | 0 refills | Status: AC

## 2023-01-14 ENCOUNTER — Other Ambulatory Visit: Payer: Self-pay | Admitting: Family Medicine

## 2023-01-15 ENCOUNTER — Other Ambulatory Visit: Payer: Self-pay | Admitting: Family Medicine

## 2023-01-17 ENCOUNTER — Other Ambulatory Visit: Payer: Self-pay | Admitting: Family Medicine

## 2023-01-20 ENCOUNTER — Encounter: Payer: Self-pay | Admitting: Family Medicine

## 2023-01-22 ENCOUNTER — Other Ambulatory Visit: Payer: Self-pay | Admitting: Urology

## 2023-01-24 ENCOUNTER — Other Ambulatory Visit: Payer: Self-pay | Admitting: Family Medicine

## 2023-01-26 ENCOUNTER — Encounter: Payer: Self-pay | Admitting: Family Medicine

## 2023-02-06 ENCOUNTER — Other Ambulatory Visit: Payer: Self-pay | Admitting: Family Medicine

## 2023-02-09 ENCOUNTER — Other Ambulatory Visit: Payer: Self-pay | Admitting: Family Medicine

## 2023-02-14 ENCOUNTER — Ambulatory Visit (HOSPITAL_BASED_OUTPATIENT_CLINIC_OR_DEPARTMENT_OTHER): Payer: Medicare PPO | Admitting: Obstetrics & Gynecology

## 2023-02-14 ENCOUNTER — Encounter (HOSPITAL_BASED_OUTPATIENT_CLINIC_OR_DEPARTMENT_OTHER): Payer: Self-pay | Admitting: Obstetrics & Gynecology

## 2023-02-14 VITALS — BP 123/51 | HR 84 | Wt 249.0 lb

## 2023-02-14 DIAGNOSIS — N952 Postmenopausal atrophic vaginitis: Secondary | ICD-10-CM | POA: Diagnosis not present

## 2023-02-14 DIAGNOSIS — K603 Anal fistula, unspecified: Secondary | ICD-10-CM

## 2023-02-14 DIAGNOSIS — N390 Urinary tract infection, site not specified: Secondary | ICD-10-CM | POA: Diagnosis not present

## 2023-02-15 NOTE — Progress Notes (Signed)
GYNECOLOGY  VISIT  CC:   estring removal/replacement  HPI: 82 y.o. G0P0 Married White or Caucasian female here for estring removal and replacement.  Pt has used this method for years and desires to continue.  Denies vaginal bleeding.  Did see Dr. Arita Miss with urology.  Taking Fosfomycin about every 7-10 days and this has really controlled UTI symptoms.  Denies vaginal bleeding.  Does have appt to see Dr. Cliffton Asters with general surgery due to recurrent of what appears to be a rectocutaneous fistula.     Past Medical History:  Diagnosis Date   Allergy    Anemia    Anxiety    Arthritis    right knee;injections every 6months    Asthma    use daily   Breast cancer (HCC) 1994/1995   HX BREAST CANCER/ right brreast and left breast in 1995   Bronchiectasis (HCC)    COMPRESSION FRACTURE, LUMBAR VERTEBRAE 08/21/2008   Qualifier: Diagnosis of  By: Lovell Sheehan MD, Balinda Quails    Depression    Diabetes mellitus    takes Amaryl and Januvia daily   Diverticulitis    Dyspnea    with activity   Early cataracts, bilateral    Eczema    Endometriosis    Fatty liver    Gastritis    Glaucoma    Hammer toe    History of kidney stones    Hyperlipidemia associated with type 2 diabetes mellitus (HCC) 07/26/2007   Diet/exercise control.     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 8 g 6   atenolol (TENORMIN) 25 MG tablet Take 1 tablet by mouth once daily 90 tablet 0   Bacillus Coagulans-Inulin (ALIGN PREBIOTIC-PROBIOTIC PO) Take 1 capsule by mouth daily.      BD PEN NEEDLE NANO 2ND GEN 32G X 4 MM MISC USE AS DIRECTED DAILY 100 each 0   cetirizine (ZYRTEC) 10 MG tablet Take 10 mg by mouth every morning.      Cholecalciferol (VITAMIN D3) 125 MCG (5000 UT) TABS Take 5,000 Units by mouth daily.     Coenzyme Q10 (COQ10) 200 MG CAPS Take 200 mg by mouth daily.     desvenlafaxine (PRISTIQ) 50 MG 24 hr tablet Take 1 tablet (50 mg total) by mouth daily. 90 tablet  3   dicyclomine (BENTYL) 20 MG tablet TAKE 1 TABLET BY MOUTH 4 TIMES DAILY (  MORNING,  NOON,  EVENING,  AND  BEDTIME) 120 tablet 0   dorzolamide-timolol (COSOPT) 22.3-6.8 MG/ML ophthalmic solution Place 1 drop into both eyes 2 (two) times daily.      estradiol (ESTRING) 7.5 MCG/24HR vaginal ring Place 1 each vaginally every 3 (three) months. 1 each 3   FIBER PO Take 2 tablets by mouth every evening.     fluticasone (FLONASE) 50 MCG/ACT nasal spray Use 1 spray(s) in each nostril once daily 16 g 11   glimepiride (AMARYL) 4 MG tablet TAKE 2 TABLETS BY MOUTH ONCE DAILY WITH BREAKFAST 180 tablet 0   glucose blood (ACCU-CHEK GUIDE) test strip USE 1 STRIP ONCE DAILY TO  CHECK  GLUCOSE. Dx: E11.9 100 each 3   Lancets Misc. (ACCU-CHEK FASTCLIX LANCET) KIT Use to test blood sugars daily. Dx: E11.9 1 kit 5   latanoprost (XALATAN) 0.005 % ophthalmic solution Place 1 drop into the right eye at bedtime.      loperamide (IMODIUM) 2 MG capsule Take 2 capsules (4 mg total) by mouth 3 (three) times daily as needed  for diarrhea or loose stools. 12 capsule 0   lovastatin (MEVACOR) 10 MG tablet Take 1 tablet by mouth once daily 90 tablet 0   methenamine (HIPREX) 1 g tablet Take 1 tablet (1 g total) by mouth 2 (two) times daily with a meal. 60 tablet 11   mirabegron ER (MYRBETRIQ) 50 MG TB24 tablet Take 1 tablet by mouth once daily 30 tablet 0   montelukast (SINGULAIR) 10 MG tablet TAKE 1 TABLET BY MOUTH AT BEDTIME . APPOINTMENT REQUIRED FOR FUTURE REFILLS 30 tablet 11   Multiple Vitamin (MULTIVITAMIN WITH MINERALS) TABS tablet Take 1 tablet by mouth daily. 3 Fruit Capsules 3 Vegetable Capsules - Balance of Nature 30 tablet 0   ondansetron (ZOFRAN) 4 MG tablet Take 1 tablet (4 mg total) by mouth every 8 (eight) hours as needed for nausea or vomiting. 20 tablet 0   OZEMPIC, 1 MG/DOSE, 4 MG/3ML SOPN INJECT 1 MG  SUBCUTANEOUSLY ONCE A WEEK 3 mL 0   pramipexole (MIRAPEX) 0.5 MG tablet TAKE 1 TABLET BY MOUTH AT BEDTIME 90 tablet 0   Selenium 200 MCG CAPS Take 200 mcg by mouth at bedtime.      SYMBICORT 80-4.5 MCG/ACT inhaler Inhale 2 puffs into the lungs 2 (two) times daily. 10.2 g 11   tamsulosin (FLOMAX) 0.4 MG CAPS capsule Take 1 capsule by mouth at bedtime 30 capsule 0   temazepam (RESTORIL) 15 MG capsule Take 1 capsule by mouth at bedtime 30 capsule 1   TRESIBA FLEXTOUCH 200 UNIT/ML FlexTouch Pen INJECT 80 UNITS SUBCUTANEOUSLY IN THE MORNING 9 mL 0   valsartan (DIOVAN) 80 MG tablet Take 1 tablet by mouth once daily 90 tablet 0   vitamin B-12 (CYANOCOBALAMIN) 1000 MCG tablet Take 1,000 mcg by mouth daily.     gabapentin (NEURONTIN) 100 MG capsule TAKE 1 CAPSULE BY MOUTH THREE TIMES DAILY AS NEEDED FOR  NEUROPATIC  PAIN. (Patient not taking: Reported on 02/14/2023) 270 capsule 0   terconazole (TERAZOL 7) 0.4 % vaginal cream Place 1 applicator vaginally at bedtime. (Patient not taking: Reported on 02/14/2023) 45 g 0   No current facility-administered medications on file prior to visit.    ALLERGIES:  Nitrofurantoin, Levaquin [levofloxacin in d5w], Brimonidine, Cephalexin, Erythromycin ethylsuccinate, and Oxycodone  SH:  married, non smoker  Review  of Systems  Constitutional: Negative.     PHYSICAL EXAMINATION:    BP (!) 123/51 (BP Location: Right Arm, Patient Position: Sitting, Cuff Size: Large)   Pulse 84   Wt 249 lb (112.9 kg) Comment: Reported  LMP 05/10/1992 Comment: partial  BMI 33.77 kg/m     General appearance: alert, cooperative and appears stated age Lymph:  no inguinal LAD noted  Pelvic: External genitalia:  no lesions              Urethra:  normal appearing urethra with no masses, tenderness or lesions              Bartholins and Skenes: normal                 Vagina: normal mucosa without prolapse or lesions and but some stool present just at introitus              Cervix: no lesions              Bimanual Exam:  Uterus:  normal size, contour, position, consistency, mobility, non-tender              Adnexa: no mass, fullness, tenderness              Rectovaginal: No..  Confirms.              Anus:  draining fistula present at 12 o'clock just above rectum  Pt was cleansed on perineum prior to removal of Estring as there was stool present from the fistula.  Then estring removed and replaced without difficulty.     Assessment/Plan: 1. Vaginal atrophy - Estring removed and replaced.  She does have RF.  Will recheck in 3 months.  2. Anal fistula - has upcoming appt with Dr. Cliffton Asters, general surgery  3. Recurrent UTI - has seen Dr pace in referral and is now in suppressive abx therapy with fosfomycin.

## 2023-02-16 ENCOUNTER — Other Ambulatory Visit: Payer: Self-pay | Admitting: Emergency Medicine

## 2023-02-16 DIAGNOSIS — J301 Allergic rhinitis due to pollen: Secondary | ICD-10-CM

## 2023-02-19 ENCOUNTER — Other Ambulatory Visit: Payer: Self-pay | Admitting: Urology

## 2023-02-19 ENCOUNTER — Other Ambulatory Visit: Payer: Self-pay | Admitting: Family Medicine

## 2023-02-21 ENCOUNTER — Other Ambulatory Visit: Payer: Self-pay | Admitting: Family Medicine

## 2023-02-22 DIAGNOSIS — L97522 Non-pressure chronic ulcer of other part of left foot with fat layer exposed: Secondary | ICD-10-CM | POA: Diagnosis not present

## 2023-02-22 DIAGNOSIS — L97512 Non-pressure chronic ulcer of other part of right foot with fat layer exposed: Secondary | ICD-10-CM | POA: Diagnosis not present

## 2023-02-25 DIAGNOSIS — N3941 Urge incontinence: Secondary | ICD-10-CM | POA: Diagnosis not present

## 2023-02-25 DIAGNOSIS — N3020 Other chronic cystitis without hematuria: Secondary | ICD-10-CM | POA: Diagnosis not present

## 2023-02-25 DIAGNOSIS — N200 Calculus of kidney: Secondary | ICD-10-CM | POA: Diagnosis not present

## 2023-03-02 DIAGNOSIS — N823 Fistula of vagina to large intestine: Secondary | ICD-10-CM | POA: Diagnosis not present

## 2023-03-18 DIAGNOSIS — N3020 Other chronic cystitis without hematuria: Secondary | ICD-10-CM | POA: Diagnosis not present

## 2023-03-18 DIAGNOSIS — N3941 Urge incontinence: Secondary | ICD-10-CM | POA: Diagnosis not present

## 2023-03-19 ENCOUNTER — Other Ambulatory Visit: Payer: Self-pay | Admitting: Urology

## 2023-03-19 ENCOUNTER — Other Ambulatory Visit: Payer: Self-pay | Admitting: Family Medicine

## 2023-03-21 ENCOUNTER — Other Ambulatory Visit: Payer: Self-pay | Admitting: Family Medicine

## 2023-03-27 ENCOUNTER — Other Ambulatory Visit: Payer: Self-pay | Admitting: Family

## 2023-03-28 ENCOUNTER — Other Ambulatory Visit: Payer: Self-pay | Admitting: Emergency Medicine

## 2023-03-28 ENCOUNTER — Other Ambulatory Visit: Payer: Self-pay | Admitting: Family Medicine

## 2023-03-28 DIAGNOSIS — J301 Allergic rhinitis due to pollen: Secondary | ICD-10-CM

## 2023-03-29 ENCOUNTER — Ambulatory Visit: Payer: Medicare PPO | Admitting: Gastroenterology

## 2023-03-31 ENCOUNTER — Other Ambulatory Visit: Payer: Self-pay

## 2023-03-31 ENCOUNTER — Telehealth: Payer: Self-pay | Admitting: Family Medicine

## 2023-03-31 MED ORDER — TEMAZEPAM 15 MG PO CAPS: 15.0000 mg | ORAL_CAPSULE | Freq: Every day | ORAL | 5 refills | Status: DC

## 2023-03-31 NOTE — Telephone Encounter (Signed)
Did not receive pharmacy request. This has has been sent to Dr. Durene Cal for approval.

## 2023-03-31 NOTE — Telephone Encounter (Signed)
Prescription Request  Patient states she requested the following RX 11/16 or 11/17 via Pharmacy and needs RX asap  03/31/2023  LOV: 12/24/2022  What is the name of the medication or equipment? temazepam (RESTORIL) 15 MG capsule   Have you contacted your pharmacy to request a refill? Yes   Which pharmacy would you like this sent to?  [facility] [address] [place] - [address] [place] [address] Phone: [phone] Fax: [phone]    Patient notified that their request is being sent to the clinical staff for review and that they should receive a response within 2 business days.   Please advise at Mobile [phone] (mobile)

## 2023-04-08 ENCOUNTER — Other Ambulatory Visit: Payer: Self-pay | Admitting: Family Medicine

## 2023-04-11 ENCOUNTER — Telehealth: Payer: Self-pay | Admitting: Gastroenterology

## 2023-04-11 ENCOUNTER — Other Ambulatory Visit: Payer: Self-pay | Admitting: Family Medicine

## 2023-04-11 ENCOUNTER — Other Ambulatory Visit: Payer: Self-pay | Admitting: Emergency Medicine

## 2023-04-11 DIAGNOSIS — J301 Allergic rhinitis due to pollen: Secondary | ICD-10-CM

## 2023-04-11 NOTE — Telephone Encounter (Signed)
Inbound call from patient calling in regards to previous appointment for 11/19 with Dr. Lavon Paganini. Patient states she was unaware of appointment made for 11/19 and has not had contact with our office previously. She states appointment was not communicated with her and not scheduled with her. Patient would like to reschedule appointment due to referral sent by Dr. Cliffton Asters from [facility] for a rectovaginal fistula for a colonoscopy. Patient advised Dr. Lavon Paganini does not have availability soon, patient would like to discuss appointment with a nurse. Patient would also like no show status voided.

## 2023-04-11 NOTE — Telephone Encounter (Signed)
Does she need an office visit or can she be a direct colonoscopy?

## 2023-04-13 NOTE — Telephone Encounter (Signed)
Please schedule office visit preferably with me to discuss and evaluate patient prior to scheduling procedure.  Thank you

## 2023-04-14 NOTE — Telephone Encounter (Signed)
Called the patient twice at the phone number provided. First attempt the call went to a fax machine. The 2nd attempt the call rang then turned to a fast busy signal.

## 2023-04-17 ENCOUNTER — Other Ambulatory Visit: Payer: Self-pay | Admitting: Family Medicine

## 2023-04-21 ENCOUNTER — Ambulatory Visit: Payer: Medicare PPO | Admitting: Gastroenterology

## 2023-04-24 ENCOUNTER — Other Ambulatory Visit: Payer: Self-pay | Admitting: Urology

## 2023-04-25 ENCOUNTER — Ambulatory Visit: Payer: Medicare PPO | Admitting: Family Medicine

## 2023-04-29 ENCOUNTER — Other Ambulatory Visit: Payer: Self-pay | Admitting: Family Medicine

## 2023-05-02 ENCOUNTER — Telehealth: Payer: Self-pay

## 2023-05-02 ENCOUNTER — Other Ambulatory Visit (HOSPITAL_COMMUNITY): Payer: Self-pay

## 2023-05-02 NOTE — Telephone Encounter (Signed)
Copied from CRM [id number]. Topic: Clinical - Medication Refill >> May 02, 2023 11:08 AM Lorretta Harp wrote: Most Recent Primary Care Visit:  Provider: Shelva Majestic  Department: [facility]  Visit Type: OFFICE VISIT  Date: 12/24/2022  Medication: Patients needs a prior auth. Sent for her OZEMPIC, 1 MG/DOSE, 4 MG/3ML SOPN.   Has the patient contacted their pharmacy? Yes (Agent: If no, request that the patient contact the pharmacy for the refill. If patient does not wish to contact the pharmacy document the reason why and proceed with request.) (Agent: If yes, when and what did the pharmacy advise?)  Is this the correct pharmacy for this prescription? Yes If no, delete pharmacy and type the correct one.  This is the patient's preferred pharmacy:  [facility] [address], [place] - [address] [place] [address] Phone: [phone] Fax: [phone]   Has the prescription been filled recently? No  Is the patient out of the medication? No  Has the patient been seen for an appointment in the last year OR does the patient have an upcoming appointment? Yes  Can we respond through MyChart? No  Agent: Please be advised that Rx refills may take up to 3 business days. We ask that you follow-up with your pharmacy.

## 2023-05-02 NOTE — Telephone Encounter (Signed)
Pharmacy Patient Advocate Encounter   Received notification from Pt Calls Messages that prior authorization for Ozempic 4mg /25ml is required/requested.   Insurance verification completed.   The patient is insured through [employer] .   Per test claim: Refill too soon. PA is not needed at this time. Medication was filled 04/15/23. Next eligible fill date is 05/06/23.   Key: [id number]

## 2023-05-05 ENCOUNTER — Other Ambulatory Visit: Payer: Self-pay | Admitting: Family Medicine

## 2023-05-05 NOTE — Telephone Encounter (Signed)
Copied from CRM [id number]. Topic: Clinical - Prescription Issue >> May 05, 2023 11:14 AM [name] wrote: Reason for CRM: PT is requesting a prior authorization for [facility], 1 MG/DOSE, 4 MG/3ML SOPN for 2025 per her insurance.

## 2023-05-06 ENCOUNTER — Other Ambulatory Visit: Payer: Self-pay | Admitting: *Deleted

## 2023-05-06 ENCOUNTER — Other Ambulatory Visit (HOSPITAL_COMMUNITY): Payer: Self-pay

## 2023-05-06 MED ORDER — OZEMPIC (1 MG/DOSE) 4 MG/3ML ~~LOC~~ SOPN: 1.0000 mg | PEN_INJECTOR | SUBCUTANEOUS | 0 refills | Status: DC

## 2023-05-06 NOTE — Telephone Encounter (Signed)
PA not needed at this time. Claims for 2025 cannot be done until that plan is active.

## 2023-05-10 ENCOUNTER — Telehealth: Payer: Self-pay | Admitting: Family Medicine

## 2023-05-10 MED ORDER — DESVENLAFAXINE SUCCINATE ER 50 MG PO TB24: 50.0000 mg | ORAL_TABLET | Freq: Every day | ORAL | 0 refills | Status: DC

## 2023-05-10 NOTE — Telephone Encounter (Signed)
 Copied from CRM [id number]. Topic: Clinical - Medication Refill >> May 10, 2023 10:31 AM Leila C wrote: Most Recent Primary Care Visit:  Provider: KATRINKA GARNETTE KIDD  Department: [facility]  Visit Type: OFFICE VISIT  Date: 12/24/2022  Medication: desvenlafaxine  (PRISTIQ ) 50 MG 24 hr tablet    Has the patient contacted their pharmacy? Yes, the pharmacy has not received an answer from the office and recommend patient to contact the office (Agent: If no, request that the patient contact the pharmacy for the refill. If patient does not wish to contact the pharmacy document the reason why and proceed with request.) (Agent: If yes, when and what did the pharmacy advise?)  Is this the correct pharmacy for this prescription? Yes If no, delete pharmacy and type the correct one.  This is the patient's preferred pharmacy:  [facility] [address] [place] - [address]  [place] [address] Phone: [phone] Fax: [phone]   Has the prescription been filled recently?   Is the patient out of the medication? Yes  Has the patient been seen for an appointment in the last year OR does the patient have an upcoming appointment?   Can we respond through MyChart? No, please call back [phone]  Agent: Please be advised that Rx refills may take up to 3 business days. We ask that you follow-up with your pharmacy.

## 2023-05-10 NOTE — Telephone Encounter (Signed)
Medication refilled

## 2023-05-16 ENCOUNTER — Other Ambulatory Visit: Payer: Self-pay

## 2023-05-16 MED ORDER — DESVENLAFAXINE SUCCINATE ER 50 MG PO TB24: 50.0000 mg | ORAL_TABLET | Freq: Every day | ORAL | 3 refills | Status: AC

## 2023-05-19 ENCOUNTER — Other Ambulatory Visit: Payer: Self-pay | Admitting: Family Medicine

## 2023-05-21 ENCOUNTER — Other Ambulatory Visit: Payer: Self-pay | Admitting: Urology

## 2023-05-23 DIAGNOSIS — L720 Epidermal cyst: Secondary | ICD-10-CM | POA: Diagnosis not present

## 2023-05-23 DIAGNOSIS — D224 Melanocytic nevi of scalp and neck: Secondary | ICD-10-CM | POA: Diagnosis not present

## 2023-05-23 DIAGNOSIS — L821 Other seborrheic keratosis: Secondary | ICD-10-CM | POA: Diagnosis not present

## 2023-05-23 DIAGNOSIS — L650 Telogen effluvium: Secondary | ICD-10-CM | POA: Diagnosis not present

## 2023-05-23 DIAGNOSIS — B078 Other viral warts: Secondary | ICD-10-CM | POA: Diagnosis not present

## 2023-05-23 DIAGNOSIS — L218 Other seborrheic dermatitis: Secondary | ICD-10-CM | POA: Diagnosis not present

## 2023-05-26 ENCOUNTER — Other Ambulatory Visit: Payer: Self-pay | Admitting: Family Medicine

## 2023-05-27 DIAGNOSIS — H04123 Dry eye syndrome of bilateral lacrimal glands: Secondary | ICD-10-CM | POA: Diagnosis not present

## 2023-05-27 DIAGNOSIS — E119 Type 2 diabetes mellitus without complications: Secondary | ICD-10-CM | POA: Diagnosis not present

## 2023-05-27 DIAGNOSIS — H401123 Primary open-angle glaucoma, left eye, severe stage: Secondary | ICD-10-CM | POA: Diagnosis not present

## 2023-05-27 DIAGNOSIS — H401112 Primary open-angle glaucoma, right eye, moderate stage: Secondary | ICD-10-CM | POA: Diagnosis not present

## 2023-05-30 ENCOUNTER — Ambulatory Visit: Payer: Medicare PPO | Admitting: Family Medicine

## 2023-06-06 ENCOUNTER — Other Ambulatory Visit: Payer: Self-pay | Admitting: Family Medicine

## 2023-06-07 ENCOUNTER — Other Ambulatory Visit: Payer: Self-pay | Admitting: Family Medicine

## 2023-06-14 ENCOUNTER — Ambulatory Visit: Payer: Medicare PPO | Admitting: Family Medicine

## 2023-06-14 ENCOUNTER — Encounter: Payer: Self-pay | Admitting: Family Medicine

## 2023-06-14 VITALS — BP 114/71 | HR 85 | Temp 97.9°F | Wt 259.0 lb

## 2023-06-14 DIAGNOSIS — E1165 Type 2 diabetes mellitus with hyperglycemia: Secondary | ICD-10-CM

## 2023-06-14 DIAGNOSIS — Z7985 Long-term (current) use of injectable non-insulin antidiabetic drugs: Secondary | ICD-10-CM

## 2023-06-14 DIAGNOSIS — E782 Mixed hyperlipidemia: Secondary | ICD-10-CM

## 2023-06-14 DIAGNOSIS — Z7984 Long term (current) use of oral hypoglycemic drugs: Secondary | ICD-10-CM

## 2023-06-14 DIAGNOSIS — F3342 Major depressive disorder, recurrent, in full remission: Secondary | ICD-10-CM

## 2023-06-14 DIAGNOSIS — R413 Other amnesia: Secondary | ICD-10-CM

## 2023-06-14 DIAGNOSIS — I10 Essential (primary) hypertension: Secondary | ICD-10-CM

## 2023-06-14 DIAGNOSIS — J479 Bronchiectasis, uncomplicated: Secondary | ICD-10-CM | POA: Diagnosis not present

## 2023-06-14 NOTE — Progress Notes (Signed)
 Phone [phone] In person visit   Subjective:   [name] is a 83 y.o. year old very pleasant female patient who presents for/with See problem oriented charting Chief Complaint  Patient presents with   Follow-up    Pt here for 4 month f.u w.o any concerns    Depression   Diabetes   Hypertension   Hyperlipidemia    Past Medical History-  Patient Active Problem List   Diagnosis Date Noted   Perineal sinus 03/25/2021    Priority: High   Chronic UTI 02/23/2021    Priority: High   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 11/22/2019    Priority: High   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    Priority: High   Bronchiectasis (HCC) 10/18/2015    Priority: High   Recurrent major depression in full remission (HCC) 01/03/2014    Priority: High   Poorly controlled diabetes mellitus (HCC) 12/28/2006    Priority: High   Dyspnea on exertion 02/22/2019    Priority: Medium    Adenomatous polyp 12/20/2018    Priority: Medium    History of colonic polyps 05/18/2017    Priority: Medium    Aortic atherosclerosis (HCC) 02/16/2016    Priority: Medium    Solitary pulmonary nodule 01/08/2016    Priority: Medium    IBS (irritable bowel syndrome) 07/17/2014    Priority: Medium    Essential hypertension 01/03/2014    Priority: Medium    Primary open-angle glaucoma 08/02/2012    Priority: Medium    Fatty liver 03/18/2009    Priority: Medium    Hyperlipidemia 07/26/2007    Priority: Medium    ANEMIA, B12 DEFICIENCY 12/29/2006    Priority: Medium    RESTLESS LEG SYNDROME, MILD 12/28/2006    Priority: Medium    Osteoporosis 12/12/2006    Priority: Medium    Pain due to onychomycosis of toenails of both feet 11/08/2018    Priority: Low   History of total knee replacement, bilateral 11/14/2017    Priority: Low   Arthritis of midfoot 03/03/2016    Priority: Low   Hammertoes of both feet 03/03/2016    Priority: Low   Posterior tibial tendon dysfunction 01/03/2014     Priority: Low   Osteoarthritis, knee 01/03/2014    Priority: Low   Glaucoma 01/03/2014    Priority: Low   Obesity (BMI 30-39.9) 06/15/2013    Priority: Low   Foot tendinitis 07/20/2010    Priority: Low   INSOMNIA, CHRONIC 07/25/2009    Priority: Low   GERD 12/25/2007    Priority: Low   Idiopathic peripheral neuropathy 12/28/2006    Priority: Low   BREAST CANCER, HX OF 12/12/2006    Priority: Low   Chronic cystitis 11/12/2022   Rectal fistula 11/12/2022   Chronic rhinitis 09/14/2022   Chronic diastolic heart failure (HCC) 09/02/2021   Right lower quadrant abdominal pain 06/16/2021   Hoarseness 10/17/2020   Physical deconditioning 02/22/2019    Medications- reviewed and updated Current Outpatient Medications  Medication Sig Dispense Refill   atenolol  (TENORMIN ) 25 MG tablet Take 1 tablet by mouth once daily 90 tablet 0   Bacillus Coagulans-Inulin (ALIGN PREBIOTIC-PROBIOTIC PO) Take 1 capsule by mouth daily.      BD PEN NEEDLE NANO 2ND GEN 32G X 4 MM MISC USE AS DIRECTED DAILY 100 each 0   cetirizine (ZYRTEC) 10 MG tablet Take 10 mg by mouth every morning.      Cholecalciferol  (VITAMIN D3) 125 MCG (5000 UT) TABS Take 5,000  Units by mouth daily.     Coenzyme Q10 (COQ10) 200 MG CAPS Take 200 mg by mouth daily.     desvenlafaxine  (PRISTIQ ) 50 MG 24 hr tablet Take 1 tablet (50 mg total) by mouth daily. 90 tablet 3   dorzolamide -timolol  (COSOPT ) 22.3-6.8 MG/ML ophthalmic solution Place 1 drop into both eyes 2 (two) times daily.      estradiol  (ESTRING ) 7.5 MCG/24HR vaginal ring Place 1 each vaginally every 3 (three) months. 1 each 3   FIBER PO Take 2 tablets by mouth every evening.     fluticasone  (FLONASE ) 50 MCG/ACT nasal spray Use 1 spray(s) in each nostril once daily 16 g 0   fosfomycin (MONUROL) 3 g PACK Take 3 g by mouth once. Once per month on external instructions- she reports was told once every 10 days per urology     gabapentin  (NEURONTIN ) 100 MG capsule TAKE 1 CAPSULE BY  MOUTH THREE TIMES DAILY AS NEEDED FOR  NEUROPATIC  PAIN. (Patient taking differently: TAKE 1 CAPSULE BY MOUTH THREE TIMES DAILY AS NEEDED FOR  NEUROPATIC  PAIN.) 270 capsule 0   glimepiride  (AMARYL ) 4 MG tablet TAKE 2 TABLETS BY MOUTH ONCE DAILY WITH BREAKFAST 180 tablet 0   glucose blood (ACCU-CHEK GUIDE) test strip USE 1 STRIP ONCE DAILY TO  CHECK  GLUCOSE. Dx: E11.9 100 each 3   Lancets Misc. (ACCU-CHEK FASTCLIX LANCET) KIT Use to test blood sugars daily. Dx: E11.9 1 kit 5   latanoprost  (XALATAN ) 0.005 % ophthalmic solution Place 1 drop into the right eye at bedtime.      loperamide  (IMODIUM ) 2 MG capsule Take 2 capsules (4 mg total) by mouth 3 (three) times daily as needed for diarrhea or loose stools. 12 capsule 0   lovastatin  (MEVACOR ) 10 MG tablet Take 1 tablet by mouth once daily 90 tablet 0   methenamine  (HIPREX ) 1 g tablet Take 1 tablet (1 g total) by mouth 2 (two) times daily with a meal. 60 tablet 11   montelukast  (SINGULAIR ) 10 MG tablet TAKE 1 TABLET BY MOUTH AT BEDTIME . APPOINTMENT REQUIRED FOR FUTURE REFILLS 30 tablet 11   MYRBETRIQ 50 MG TB24 tablet Take 1 tablet by mouth once daily 30 tablet 0   ondansetron  (ZOFRAN ) 4 MG tablet Take 1 tablet (4 mg total) by mouth every 8 (eight) hours as needed for nausea or vomiting. 20 tablet 0   OZEMPIC , 1 MG/DOSE, 4 MG/3ML SOPN INJECT 1 MG  SUBCUTANEOUSLY ONCE A WEEK 3 mL 0   pramipexole  (MIRAPEX ) 0.5 MG tablet TAKE 1 TABLET BY MOUTH AT BEDTIME 90 tablet 0   Selenium 200 MCG CAPS Take 200 mcg by mouth at bedtime.      SYMBICORT  80-4.5 MCG/ACT inhaler Inhale 2 puffs into the lungs 2 (two) times daily. 10.2 g 11   tamsulosin  (FLOMAX ) 0.4 MG CAPS capsule Take 1 capsule by mouth at bedtime 30 capsule 0   temazepam  (RESTORIL ) 15 MG capsule Take 1 capsule (15 mg total) by mouth at bedtime. 30 capsule 5   terconazole  (TERAZOL 7 ) 0.4 % vaginal cream Place 1 applicator vaginally at bedtime. 45 g 0   TRESIBA  FLEXTOUCH 200 UNIT/ML FlexTouch Pen INJECT  80 UNITS SUBCUTANEOUSLY IN THE MORNING (Patient taking differently: 90 Units.) 9 mL 0   valsartan  (DIOVAN ) 80 MG tablet Take 1 tablet by mouth once daily 90 tablet 0   vitamin B-12 (CYANOCOBALAMIN ) 1000 MCG tablet Take 1,000 mcg by mouth daily.     albuterol  (VENTOLIN  HFA) 108 (  90 Base) MCG/ACT inhaler Inhale 2 puffs into the lungs every 6 (six) hours as needed for wheezing or shortness of breath. (Patient not taking: Reported on 06/14/2023) 8 g 6   dicyclomine  (BENTYL ) 20 MG tablet TAKE 1 TABLET BY MOUTH 4 TIMES DAILY (  MORNING,  NOON,  EVENING,  AND  BEDTIME) (Patient not taking: Reported on 06/14/2023) 120 tablet 0   Multiple Vitamin (MULTIVITAMIN WITH MINERALS) TABS tablet Take 1 tablet by mouth daily. 3 Fruit Capsules 3 Vegetable Capsules - Balance of Lysle (Patient not taking: Reported on 06/14/2023) 30 tablet 0   No current facility-administered medications for this visit.     Objective:  BP 114/71   Pulse 85   Temp 97.9 F (36.6 C)   Wt 259 lb (117.5 kg)   LMP 05/10/1992 Comment: partial  SpO2 94%   BMI 35.13 kg/m  Gen: NAD, resting comfortably CV: RRR no murmurs rubs or gallops Lungs: CTAB no crackles, wheeze, rhonchi Abdomen: soft/nontender/nondistended/normal bowel sounds. No rebound or guarding.  Ext: no edema Skin: warm, dry Neuro: grossly normal, moves all extremities  Diabetic Foot Exam - Simple   Simple Foot Form Diabetic Foot exam was performed with the following findings: Yes 06/14/2023  3:04 PM  Visual Inspection No deformities, no ulcerations, no other skin breakdown bilaterally: Yes Sensation Testing See comments: Yes Pulse Check Posterior Tibialis and Dorsalis pulse intact bilaterally: Yes Comments Some erythema on toes 3 and 4 on left foot- was trying salonpas and was on extended period- advised against. Also sees podiatry tomorrow No sensation to monofilament on either feet        Assessment and Plan   #social update- grandson now with new faster  family - military family [place], [place] I believe  #memory loss- reports prevagen helping some. Will check TSH, B12- still on B12 so should be fine.   # Diabetes S: Medication:Glimepiride  8 mg daily, Ozempic  1 mg, Tresiba  90 units CBGs- usually 80's or 90's in mornings- occasionally over 100. Only 140 one day since last visit Exercise and diet- weight back up 10 lbs - looser over holidays Lab Results  Component Value Date   HGBA1C 7.3 (H) 12/24/2022   HGBA1C 7.6 (H) 08/24/2022   HGBA1C 8.3 (H) 04/27/2022   A/P: hopefully stable or improved- update a1c today. Continue current meds for now - likely hold off on changes as long as #s under 8    #hypertension S: medication: Atenolol  25 mg daily, valsartan  80 mg daily A/P: stable- continue current medicines   #hyperlipidemia S: Medication:Lovastatin  10 mg daily Lab Results  Component Value Date   CHOL 112 12/24/2022   HDL 34.70 (L) 12/24/2022   LDLCALC 58 12/24/2022   LDLDIRECT 77.0 02/16/2016   TRIG 97.0 12/24/2022   CHOLHDL 3 12/24/2022  A/P: cholesterol controled- too soon for repeat- continue current medications   # Bronchiectasis-follows with pulmonary-on Symbicort  80-4.5 mg 2 puffs twice daily and has albuterol  but doesn't need   # Restless legs-Also on Mirapex  0.5 mg at bedtime- not needing lately  # Insomnia # Neuropathic pain/neuropathy S: Medication: Gabapentin  100 mg up to 3 times daily as needed  -Also requires temazepam  15 mg for insomnia A/P: insomnia reasonable control continue current medications Neuropathy doing ok on gabapentin - continue current medications     # Depression S: Medication:Pristiq  50 mg daily    06/14/2023    2:39 PM 02/14/2023    3:13 PM 12/24/2022    1:28 PM  Depression screen PHQ 2/9  Decreased Interest 1 0 0  Down, Depressed, Hopeless 0 0 0  PHQ - 2 Score 1 0 0  Altered sleeping 3  0  Tired, decreased energy 2  2  Change in appetite 0  0  Feeling bad or failure about yourself  0  0   Trouble concentrating 0  0  Moving slowly or fidgety/restless 0  0  Suicidal thoughts 0  0  PHQ-9 Score 6  2  Difficult doing work/chores Very difficult  Not difficult at all  A/P: reasonable control- she reports the increased issues here are anxiety related about recurrent urinary tract infection issues as below as this is primarily situational    # Recurrent UTI-on tamsulosin  0.4 mg daily, methenamine  1 g twice daily- still on  -also saw Dr. Teresa for rectovaginal fistula and was [facility] - she is making a trip to [place] for urogynecology visit. 3 prior surgeries with Dr. Sheldon.  -now on orange powder through Dr. Elisabeth every 10 days- antibiotic to redue risk  #left eye vision worsening- on new eye drop after eye surgery- so far no significant improvement  # IBS-on dicyclomine  20 mg up to 4 times a day- not taking this . Align does ok  Recommended follow up: Return in about 4 months (around 10/12/2023) for physical or sooner if needed.Schedule b4 you leave. Push out April visit Future Appointments  Date Time Provider Department Center  06/16/2023  1:35 PM Cleotilde Ronal RAMAN, MD [facility]  08/25/2023  1:20 PM Katrinka Garnette KIDD, MD [facility]  09/15/2023  2:30 PM Parrett, Madelin RAMAN, NP [facility] None   Lab/Order associations:   ICD-10-CM   1. Recurrent major depression in full remission (HCC)  F33.42     2. Poorly controlled diabetes mellitus (HCC)  E11.65 Hemoglobin A1c    Comprehensive metabolic panel    CBC w/Diff    3. Essential hypertension  I10 Comprehensive metabolic panel    4. Mixed hyperlipidemia  E78.2     5. Bronchiectasis without complication (HCC)  J47.9     6. Memory loss  R41.3 TSH    Vitamin B12     No orders of the defined types were placed in this encounter.   Return precautions advised.  Garnette Katrinka, MD

## 2023-06-14 NOTE — Patient Instructions (Addendum)
 Health Maintenance Due  Topic Date Due   Medicare Annual Wellness (AWV)  05/15/2023  You are eligible to schedule your annual wellness visit with our nurse specialist [place].  Please consider scheduling this before you leave today  Please stop by lab before you go If you have mychart- we will send your results within 3 business days of us  receiving them.  If you do not have mychart- we will call you about results within 5 business days of us  receiving them.  *please also note that you will see labs on mychart as soon as they post. I will later go in and write notes on them- will say notes from Dr. Katrinka   Recommended follow up: Return in about 4 months (around 10/12/2023) for physical or sooner if needed.Schedule b4 you leave. -reschedule April visit

## 2023-06-15 ENCOUNTER — Encounter: Payer: Self-pay | Admitting: Family Medicine

## 2023-06-15 LAB — COMPREHENSIVE METABOLIC PANEL
ALT: 13 U/L (ref 0–35)
AST: 16 U/L (ref 0–37)
Albumin: 3.9 g/dL (ref 3.5–5.2)
Alkaline Phosphatase: 56 U/L (ref 39–117)
BUN: 20 mg/dL (ref 6–23)
CO2: 31 meq/L (ref 19–32)
Calcium: 9.3 mg/dL (ref 8.4–10.5)
Chloride: 101 meq/L (ref 96–112)
Creatinine, Ser: 0.65 mg/dL (ref 0.40–1.20)
GFR: 81.42 mL/min (ref >60.00)
Glucose, Bld: 94 mg/dL (ref 70–99)
Potassium: 4 meq/L (ref 3.5–5.1)
Sodium: 141 meq/L (ref 135–145)
Total Bilirubin: 0.5 mg/dL (ref 0.2–1.2)
Total Protein: 6.8 g/dL (ref 6.0–8.3)

## 2023-06-15 LAB — CBC WITH DIFFERENTIAL/PLATELET
Basophils Absolute: 0.1 10*3/uL (ref 0.0–0.1)
Basophils Relative: 0.8 % (ref 0.0–3.0)
Eosinophils Absolute: 0.5 10*3/uL (ref 0.0–0.7)
Eosinophils Relative: 4.7 % (ref 0.0–5.0)
HCT: 42.3 % (ref 36.0–46.0)
Hemoglobin: 13.8 g/dL (ref 12.0–15.0)
Lymphocytes Relative: 15.9 % (ref 12.0–46.0)
Lymphs Abs: 1.7 10*3/uL (ref 0.7–4.0)
MCHC: 32.6 g/dL (ref 30.0–36.0)
MCV: 90.8 fL (ref 78.0–100.0)
Monocytes Absolute: 0.9 10*3/uL (ref 0.1–1.0)
Monocytes Relative: 8.1 % (ref 3.0–12.0)
Neutro Abs: 7.5 10*3/uL (ref 1.4–7.7)
Neutrophils Relative %: 70.5 % (ref 43.0–77.0)
Platelets: 299 10*3/uL (ref 150.0–400.0)
RBC: 4.66 Mil/uL (ref 3.87–5.11)
RDW: 14.3 % (ref 11.5–15.5)
WBC: 10.6 10*3/uL — ABNORMAL HIGH (ref 4.0–10.5)

## 2023-06-15 LAB — TSH: TSH: 3.99 u[IU]/mL (ref 0.35–5.50)

## 2023-06-15 LAB — HEMOGLOBIN A1C: Hgb A1c MFr Bld: 6.8 % — ABNORMAL HIGH (ref 4.6–6.5)

## 2023-06-15 LAB — VITAMIN B12: Vitamin B-12: 478 pg/mL (ref 211–911)

## 2023-06-16 ENCOUNTER — Ambulatory Visit (HOSPITAL_BASED_OUTPATIENT_CLINIC_OR_DEPARTMENT_OTHER): Payer: Medicare PPO | Admitting: Obstetrics & Gynecology

## 2023-06-16 ENCOUNTER — Encounter (HOSPITAL_BASED_OUTPATIENT_CLINIC_OR_DEPARTMENT_OTHER): Payer: Self-pay | Admitting: Obstetrics & Gynecology

## 2023-06-16 ENCOUNTER — Other Ambulatory Visit (HOSPITAL_COMMUNITY)
Admission: RE | Admit: 2023-06-16 | Discharge: 2023-06-16 | Disposition: A | Discharge status: Home / Self Care | Payer: Medicare PPO | Source: Ambulatory Visit | Attending: Obstetrics & Gynecology | Admitting: Obstetrics & Gynecology

## 2023-06-16 VITALS — BP 122/45 | HR 82

## 2023-06-16 DIAGNOSIS — N898 Other specified noninflammatory disorders of vagina: Secondary | ICD-10-CM | POA: Insufficient documentation

## 2023-06-16 DIAGNOSIS — N952 Postmenopausal atrophic vaginitis: Secondary | ICD-10-CM

## 2023-06-16 DIAGNOSIS — K604 Rectal fistula, unspecified: Secondary | ICD-10-CM | POA: Diagnosis not present

## 2023-06-16 MED ORDER — FLUCONAZOLE 150 MG PO TABS: ORAL_TABLET | ORAL | 0 refills | Status: DC

## 2023-06-16 NOTE — Progress Notes (Signed)
 GYNECOLOGY  VISIT  CC:   Estring  changes, questions  HPI: 83 y.o. G0P0 Married White or Caucasian female here for removal and replacement of Estring .  Denies vaginal bleeding.    She continues to have issues with rectocutaneous fistula.  Recently saw Dr. Teresa.  He felt her situation was too complicated for him.  She does have an appt with a urologynecologist in [place] she she found when doing an therapist, art.  Discussed with pt I think she will likely need a specialist at an academic center.  She has chronic/recurrent UTIs and has see urology, Dr. Elisabeth.  Mrs. Mcwatters is taking fosfomycin once every 10 days.  Mrs. Neuberger reports having a cystoscope as well.  There was a lot of scarring from the recurrent UTIs.  Estradiol  cream was recommended but when she started using it, she saw some blood.  She's not using this right now.  Also, she is having a lot of skin itching.  She is using a vaginal anti-itch cream.  .   Past Medical History:  Diagnosis Date   Allergy    Anemia    Anxiety    Arthritis    right knee;injections every 6months    Asthma    use daily   Breast cancer (HCC) 1994/1995   HX BREAST CANCER/ right brreast and left breast in 1995   Bronchiectasis (HCC)    COMPRESSION FRACTURE, LUMBAR VERTEBRAE 08/21/2008   Qualifier: Diagnosis of  By: Mavis MD, Norleen BRAVO    Depression    Diabetes mellitus    takes Amaryl  and Januvia  daily   Diverticulitis    Dyspnea    with activity   Early cataracts, bilateral    Eczema    Endometriosis    Fatty liver    Gastritis    Glaucoma    Hammer toe    History of kidney stones    Hyperlipidemia associated with type 2 diabetes mellitus (HCC) 07/26/2007   Diet/exercise control.     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      BD PEN NEEDLE NANO 2ND GEN 32G X 4 MM MISC USE AS DIRECTED DAILY 100 each 0   cetirizine (ZYRTEC) 10 MG tablet Take 10 mg by mouth every morning.  Cholecalciferol  (VITAMIN D3) 125 MCG (5000 UT) TABS Take 5,000 Units by mouth daily.     Coenzyme Q10 (COQ10) 200 MG CAPS Take 200 mg by mouth daily.     desvenlafaxine  (PRISTIQ ) 50 MG 24 hr tablet Take 1 tablet (50 mg total) by mouth daily. 90 tablet 3   dorzolamide -timolol  (COSOPT ) 22.3-6.8 MG/ML ophthalmic solution Place 1 drop into both eyes 2 (two) times daily.      estradiol  (ESTRING ) 7.5 MCG/24HR vaginal ring Place 1 each vaginally every 3 (three) months. 1 each 3   FIBER PO Take 2 tablets by mouth every evening.     fluticasone  (FLONASE ) 50 MCG/ACT nasal spray Use 1 spray(s) in each nostril once daily 16 g 0   fosfomycin (MONUROL) 3 g PACK Take 3 g by mouth once. Once per month on external instructions- she reports was told once every 10 days per urology     gabapentin  (NEURONTIN ) 100 MG capsule TAKE 1 CAPSULE BY MOUTH THREE TIMES DAILY AS NEEDED FOR  NEUROPATIC  PAIN. (Patient taking differently: TAKE 1 CAPSULE BY MOUTH THREE TIMES DAILY AS NEEDED FOR  NEUROPATIC  PAIN.)  270 capsule 0   glimepiride  (AMARYL ) 4 MG tablet TAKE 2 TABLETS BY MOUTH ONCE DAILY WITH BREAKFAST 180 tablet 0   glucose blood (ACCU-CHEK GUIDE) test strip USE 1 STRIP ONCE DAILY TO  CHECK  GLUCOSE. Dx: E11.9 100 each 3   Lancets Misc. (ACCU-CHEK FASTCLIX LANCET) KIT Use to test blood sugars daily. Dx: E11.9 1 kit 5   latanoprost  (XALATAN ) 0.005 % ophthalmic solution Place 1 drop into the right eye at bedtime.      loperamide  (IMODIUM ) 2 MG capsule Take 2 capsules (4 mg total) by mouth 3 (three) times daily as needed for diarrhea or loose stools. 12 capsule 0   lovastatin  (MEVACOR ) 10 MG tablet Take 1 tablet by mouth once daily 90 tablet 0   methenamine  (HIPREX ) 1 g tablet Take 1 tablet (1 g total) by mouth 2 (two) times daily with a meal. 60 tablet 11   montelukast  (SINGULAIR ) 10 MG tablet TAKE 1 TABLET BY MOUTH AT BEDTIME . APPOINTMENT REQUIRED FOR FUTURE REFILLS 30 tablet 11   MYRBETRIQ 50 MG TB24 tablet Take 1 tablet by mouth once daily 30 tablet 0   ondansetron  (ZOFRAN ) 4 MG tablet Take 1 tablet (4 mg total) by mouth every 8 (eight) hours as needed for nausea or vomiting. 20 tablet 0   OZEMPIC , 1 MG/DOSE, 4 MG/3ML SOPN INJECT 1 MG  SUBCUTANEOUSLY ONCE A WEEK 3 mL 0   pramipexole  (MIRAPEX ) 0.5 MG tablet TAKE 1 TABLET BY MOUTH AT BEDTIME 90 tablet 0   Selenium 200 MCG CAPS Take 200 mcg by mouth at bedtime.      SYMBICORT  80-4.5 MCG/ACT inhaler Inhale 2 puffs into the lungs 2 (two) times daily. 10.2 g 11   tamsulosin  (FLOMAX ) 0.4 MG CAPS capsule Take 1 capsule by mouth at bedtime 30 capsule 0   temazepam  (RESTORIL ) 15 MG capsule Take 1 capsule (15 mg total) by mouth at bedtime. 30 capsule 5   terconazole  (TERAZOL 7 ) 0.4 % vaginal cream Place 1 applicator vaginally at bedtime. 45 g 0   TRESIBA  FLEXTOUCH 200 UNIT/ML FlexTouch Pen INJECT 80 UNITS SUBCUTANEOUSLY IN THE MORNING (Patient taking differently: 90 Units.) 9 mL 0   valsartan  (DIOVAN ) 80 MG tablet Take 1 tablet by mouth once daily 90  tablet 0   vitamin B-12 (CYANOCOBALAMIN ) 1000 MCG tablet Take 1,000 mcg by  mouth daily.     albuterol  (VENTOLIN  HFA) 108 (90 Base) MCG/ACT inhaler Inhale 2 puffs into the lungs every 6 (six) hours as needed for wheezing or shortness of breath. (Patient not taking: Reported on 06/16/2023) 8 g 6   dicyclomine  (BENTYL ) 20 MG tablet TAKE 1 TABLET BY MOUTH 4 TIMES DAILY (  MORNING,  NOON,  EVENING,  AND  BEDTIME) (Patient not taking: Reported on 06/16/2023) 120 tablet 0   Multiple Vitamin (MULTIVITAMIN WITH MINERALS) TABS tablet Take 1 tablet by mouth daily. 3 Fruit Capsules 3 Vegetable Capsules - Balance of Lysle (Patient not taking: Reported on 06/16/2023) 30 tablet 0   No current facility-administered medications on file prior to visit.    ALLERGIES: Nitrofurantoin, Levaquin [levofloxacin in d5w], Brimonidine, Cephalexin , Erythromycin ethylsuccinate, and Oxycodone   SH:  married, non smoker  Review of Systems  Constitutional: Negative.   Genitourinary:        Vulvar itching    PHYSICAL EXAMINATION:    BP (!) 122/45 (BP Location: Right Arm, Patient Position: Sitting, Cuff Size: Large)   Pulse 82   LMP 05/10/1992 Comment: partial    General appearance: alert, cooperative and appears stated age Lymph:  no inguinal LAD noted  Pelvic: External genitalia:  scarring on left inferior vulva              Urethra:  normal appearing urethra with no masses, tenderness or lesions              Bartholins and Skenes: normal                 Vagina: estring  removed and replaced, no discharge or stool in vagina, but just inferior to introitus is a draining fistula with stool present  Chaperone, Bascom Kotyk, CMA, was present for exam.  Assessment/Plan: 1. Vaginal atrophy (Primary) - estring  removed and replaced without difficulty  2. Vaginal discharge - this is likely due to amount of stool that is coming from fistula and difficulty with hygiene - Cervicovaginal ancillary only( [place]) -  fluconazole  (DIFLUCAN ) 150 MG tablet; Take 1 tablet every 72 hours for 3 doses.  Dispense: 1 tablet; Refill: 0  3. Vaginal itching  4. Rectocutaneous fistula - Dr. Teresa felt pt needs colonoscopy.  Referral will be placed. - Ambulatory referral to Gastroenterology  - I feel she will likely need to see surgeon at an academic center.  Will reach out to [facility] colleague to see if has suggestions for Mrs. [name] to see.  Total time with pt:  32 minutes Documentation time:  4 minutes Total time:  36 minutes

## 2023-06-17 LAB — CERVICOVAGINAL ANCILLARY ONLY
Bacterial Vaginitis (gardnerella): NEGATIVE
Candida Glabrata: POSITIVE — AB
Candida Vaginitis: POSITIVE — AB
Comment: NEGATIVE
Comment: NEGATIVE
Comment: NEGATIVE

## 2023-06-18 ENCOUNTER — Other Ambulatory Visit: Payer: Self-pay | Admitting: Family Medicine

## 2023-06-20 ENCOUNTER — Telehealth (HOSPITAL_BASED_OUTPATIENT_CLINIC_OR_DEPARTMENT_OTHER): Payer: Self-pay | Admitting: *Deleted

## 2023-06-20 ENCOUNTER — Other Ambulatory Visit (HOSPITAL_BASED_OUTPATIENT_CLINIC_OR_DEPARTMENT_OTHER): Payer: Self-pay | Admitting: Obstetrics & Gynecology

## 2023-06-20 DIAGNOSIS — N898 Other specified noninflammatory disorders of vagina: Secondary | ICD-10-CM

## 2023-06-20 MED ORDER — FLUCONAZOLE 150 MG PO TABS: ORAL_TABLET | ORAL | 0 refills | Status: DC

## 2023-06-20 NOTE — Telephone Encounter (Signed)
 Pt called in to ask if she should keep appt that is scheduled with Nanine Babcock, urogynecology. Advised pt that, per provider, she should see a colorectal surgeon instead. Pt had an appt with Dr. Hollis Lurie but cancelled it. She will call back and try to reschedule or schedule with Dr. Ara Knee. Pt to let us  know if she needs additional referral.

## 2023-06-21 NOTE — Telephone Encounter (Signed)
No answer. No voicemail.

## 2023-06-21 NOTE — Telephone Encounter (Signed)
Spoke with the patient. She is seeing surgeon with [facility] for an opinion on surgical repair of rectovaginal fistula. Her appointment with [facility] is in March. She has previously seen Dr Cliffton Asters with [facility]. Patient was advised a colonoscopy should be done to r/o other problems.   Patient has also been referred to [name] by her PCP for a colonoscopy. Appointment to discuss this with Dr Lavon Paganini on 07/07/23 at 10:10 am.  Mornings are difficult for patient and if an afternoon opening occurs, I will ask the patient if she wants to move her appointment.

## 2023-06-21 NOTE — Telephone Encounter (Signed)
Inbound call from patient requesting a call to discuss scheduling with Dr. Lavon Paganini. Please advise, thank you.

## 2023-06-21 NOTE — Telephone Encounter (Signed)
Patient needs an appointment. She has been referred to [name] for a colonoscopy.  Called the patient. No answer. No voicemail.

## 2023-06-22 ENCOUNTER — Other Ambulatory Visit: Payer: Self-pay | Admitting: Family Medicine

## 2023-06-23 NOTE — Telephone Encounter (Signed)
See pharmacy note

## 2023-06-25 ENCOUNTER — Other Ambulatory Visit: Payer: Self-pay | Admitting: Family Medicine

## 2023-06-25 ENCOUNTER — Other Ambulatory Visit: Payer: Self-pay | Admitting: Urology

## 2023-06-28 ENCOUNTER — Other Ambulatory Visit: Payer: Self-pay | Admitting: Family Medicine

## 2023-07-06 ENCOUNTER — Other Ambulatory Visit: Payer: Self-pay | Admitting: Emergency Medicine

## 2023-07-06 DIAGNOSIS — J301 Allergic rhinitis due to pollen: Secondary | ICD-10-CM

## 2023-07-07 ENCOUNTER — Ambulatory Visit: Payer: Medicare PPO | Admitting: Gastroenterology

## 2023-07-07 ENCOUNTER — Encounter: Payer: Self-pay | Admitting: Gastroenterology

## 2023-07-07 VITALS — BP 122/62 | HR 90 | Ht 72.0 in | Wt 258.0 lb

## 2023-07-07 DIAGNOSIS — N390 Urinary tract infection, site not specified: Secondary | ICD-10-CM

## 2023-07-07 DIAGNOSIS — Z87448 Personal history of other diseases of urinary system: Secondary | ICD-10-CM | POA: Diagnosis not present

## 2023-07-07 DIAGNOSIS — Z860100 Personal history of colon polyps, unspecified: Secondary | ICD-10-CM

## 2023-07-07 DIAGNOSIS — K604 Rectal fistula, unspecified: Secondary | ICD-10-CM

## 2023-07-07 MED ORDER — SUFLAVE 178.7 G PO SOLR: 1.0000 | Freq: Once | ORAL | 0 refills | Status: DC

## 2023-07-07 MED ORDER — SUTAB 1479-225-188 MG PO TABS: ORAL_TABLET | ORAL | 0 refills | Status: DC

## 2023-07-07 NOTE — Patient Instructions (Addendum)
 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 She has had multiple unsuccessful surgeries and is experiencing recurrent urinary tract infections due to the fistula. She is scheduled to see a colorectal surgeon for further evaluation and potential treatment. Her bowel movements are regular, and she maintains a healthy diet. She also has a history of kidney stones, which are currently asymptomatic.  YOUR PLAN:  -RECTOCUTANEOUS FISTULA: A rectocutaneous fistula is an abnormal connection between the rectum and the skin. You have had multiple surgeries without success, leading to complications like recurrent urinary tract infections. You are scheduled to see Dr. Drue Dun, a colorectal surgeon, on March 11 for further evaluation. Potential treatments include minimal surgery and seton placement, which involves placing a thread to encourage natural healing. A colonoscopy is scheduled for April 21 at [facility] to better assess the condition. Continuing your current antibiotic regimen is important to manage infections.  -RECURRENT UTIS: Recurrent urinary tract infections are likely due to the rectocutaneous fistula. You should continue with the current antibiotic regimen as prescribed by Dr. Arita Miss to manage these infections.  -KIDNEY STONES: You have a history of a 5 cm kidney stone that was treated with lithotripsy. Currently, you are asymptomatic and do not need any medications. We will monitor for any recurrence of symptoms.  -GENERAL HEALTH MAINTENANCE: To improve your overall health and manage your weight, avoid artificial sweeteners, fruit juice, soda, and sweet tea. Increase your water intake, possibly with lemon and honey, and continue taking fiber supplements. Maintain a diet rich in fruits, vegetables, nuts, and oatmeal.  INSTRUCTIONS:  - Follow-up appointment with Dr. Drue Dun on March 11 - Colonoscopy scheduled for April 21 at [facility] will receive your bowel preparation through [facility], which ensures the lowest copay and home delivery, with outreach via text or call from an [phone] number. Please respond promptly to avoid rescheduling. If you are interested in alternative options or have any questions please contact them at [phone]  Your Provider Has Sent Your Bowel Prep Regimen To [facility] What to expect. [facility] will contact you to verify your information and collect your copay, if applicable. Enjoy the comfort of your home while we deliver your prescription to you, free of any shipping charges. Fast, FREE delivery or shipping. [facility] accepts all major insurance benefits and applies discounts & coupons  Have additional questions? [facility]'s patient care team is always here to help.  Chat: [email or web address] Call: [phone] Email: [email or web address] NCPDP: [id number] How will we contact you? Welcome Phone call  a Welcome text and a Checkout link in a text Texts you receive from [phone] Are Not Spam.   *To set up delivery, you must complete the checkout process via link or speak to one of our patient care representatives. If we are unable to reach you, your prescription may be delayed.  To avoid waiting on hold if you call. Utilize the secure chat feature and request [facility] call you to complete the transaction or expedite your concerns.   Due to recent changes in healthcare laws, you may see the results of your imaging and laboratory studies on MyChart before your provider has had a chance to review them.  We understand that in some cases there may be results that are confusing or concerning to you. Not all laboratory results come back in the same time frame and the provider may be waiting for multiple results in order to interpret others.  Please give  [name] 48 hours in order for your provider to thoroughly review all the results before contacting the office for clarification  of your results.   You will receive your bowel preparation through [facility], which ensures the lowest copay and home delivery, with outreach via text or call from an [phone] number. Please respond promptly to avoid rescheduling. If you are interested in alternative options or have any questions please contact them at [phone]  Your Provider Has Sent Your Bowel Prep Regimen To [facility] What to expect. [facility] will contact you to verify your information and collect your copay, if applicable. Enjoy the comfort of your home while we deliver your prescription to you, free of any shipping charges. Fast, FREE delivery or shipping. [facility] accepts all major insurance benefits and applies discounts & coupons  Have additional questions? [facility]'s patient care team is always here to help.  Chat: [email or web address] Call: [phone] Email: [email or web address] NCPDP: [id number] How will we contact you? Welcome Phone call  a Welcome text and a Checkout link in a text Texts you receive from [phone] Are Not Spam.   *To set up delivery, you must complete the checkout process via link or speak to one of our patient care representatives. If we are unable to reach you, your prescription may be delayed.  To avoid waiting on hold if you call. Utilize the secure chat feature and request [facility] call you to complete the transaction or expedite your concerns.    I appreciate the  opportunity to care for you  Thank You   Marsa Aris , MD

## 2023-07-07 NOTE — Progress Notes (Signed)
 [name]     [id number]    1940-10-25  Primary Care Physician:Hunter, Aldine Contes, MD  Referring Physician: Shelva Majestic, MD [address] [place],  [place] [address]   Chief complaint: Perianal fistula  Discussed the use of AI scribe software for clinical note transcription with the patient, who gave verbal consent to proceed.  History of Present Illness   [name] "Delray Alt" is an 83 year old female who presents for evaluation and management of a rectocutaneous fistula.  She has a rectocutaneous fistula, previously misidentified as a rectovaginal fistula, which has persisted despite three surgeries. Dissatisfaction with the surgical outcomes led to her placement in a nursing home. The current diagnosis was confirmed recently, and she is scheduled to consult with a colorectal surgeon on April 11th for further evaluation and potential surgical intervention.  She experiences recurrent urinary tract infections attributed to the fistula. She is on two antibiotic regimens: an orange powder taken once every ten days and another for regular UTIs.  She underwent a colonoscopy five years ago in 2020 and is considering another if deemed necessary by her upcoming consultation.  Her bowel movements are regular, with no issues of loose stools. She takes two fiber pills nightly to aid in bowel regularity and maintains a diet including oatmeal and nuts. She avoids fruit juices, sodas, and sweet teas, although she drinks Coke Zero and sugar-free lemonade.  She has a history of a 5 cm kidney stone, which required two lithotripsy procedures. She is currently managing well without the need for previous medications related to this condition.      CT abdomen pelvis with contrast 06/18/2021 1. Mild right urothelial hyperenhancement seen at the renal pelvis, findings can be seen in the setting of infection. Correlate with urinalysis. 2. Prominent right extrarenal pelvis  with nonobstructing right nephrolithiasis. 3. Trace left pleural effusion, decreased in size when compared with prior chest CT. 4. Aortic Atherosclerosis (ICD10-I70.0).  Colonoscopy December 19, 2018 - Three 4 to 7 mm polyps in the transverse colon and in the cecum, removed with a cold snare. Resected and retrieved. - One 2 mm polyp in the transverse colon, removed with a cold biopsy forceps. Resected and retrieved. - Diverticulosis in the sigmoid colon and in the descending colon. - Non- bleeding internal hemorrhoids. - The examination was otherwise normal.  11/22/19 -  PROCEDURE: By Dr. Michaell Cowing LIFT REPAIR OF PERIRECTAL FISTULA HEMORRHOIDECTOMY EXCISION OF ANAL POLYP HEMORRHOID LIGATION & PEXY ANORECTAL EXAMINATION UNDER ANESTHESIA  03/07/20 PROCEDURE: Dr. Michaell Cowing ANORECTAL EXAM UNDER ANESTHESIA ANAL FISTULOTOMY WITH MARSUPIALIZATION  03/19/21 PROCEDURE: By Dr. Michaell Cowing ANORECTAL EXAM UNDER ANESTHESIA LIMBERG RHOMBOID ROTATIONAL FLAP CLOSURE   Outpatient Encounter Medications as of 07/07/2023  Medication Sig   atenolol (TENORMIN) 25 MG tablet Take 1 tablet by mouth once daily   Bacillus Coagulans-Inulin (ALIGN PREBIOTIC-PROBIOTIC PO) Take 1 capsule by mouth daily.    BD PEN NEEDLE NANO 2ND GEN 32G X 4 MM MISC USE AS DIRECTED DAILY   cetirizine (ZYRTEC) 10 MG tablet Take 10 mg by mouth every morning.    Cholecalciferol (VITAMIN D3) 125 MCG (5000 UT) TABS Take 5,000 Units by mouth daily.   Coenzyme Q10 (COQ10) 200 MG CAPS Take 200 mg by mouth daily.   desvenlafaxine (PRISTIQ) 50 MG 24 hr tablet Take 1 tablet (50 mg total) by mouth daily.   dorzolamide-timolol (COSOPT) 22.3-6.8 MG/ML ophthalmic solution Place 1 drop into both eyes 2 (  two) times daily.    estradiol (ESTRING) 7.5 MCG/24HR vaginal ring Place 1 each vaginally every 3 (three) months.   FIBER PO Take 2 tablets by mouth every evening.   fluconazole (DIFLUCAN) 150 MG tablet Take 1 tablet and repeat in 72 hours   fluticasone  (FLONASE) 50 MCG/ACT nasal spray Use 1 spray(s) in each nostril once daily   fosfomycin (MONUROL) 3 g PACK Take 3 g by mouth once. Once per month on external instructions- she reports was told once every 10 days per urology   gabapentin (NEURONTIN) 100 MG capsule TAKE 1 CAPSULE BY MOUTH THREE TIMES DAILY AS NEEDED FOR  NEUROPATIC  PAIN. (Patient taking differently: TAKE 1 CAPSULE BY MOUTH THREE TIMES DAILY AS NEEDED FOR  NEUROPATIC  PAIN.)   glimepiride (AMARYL) 4 MG tablet TAKE 2 TABLETS BY MOUTH ONCE DAILY WITH BREAKFAST   glucose blood (ACCU-CHEK GUIDE) test strip USE 1 STRIP ONCE DAILY TO  CHECK  GLUCOSE. Dx: E11.9   Lancets Misc. (ACCU-CHEK FASTCLIX LANCET) KIT Use to test blood sugars daily. Dx: E11.9   latanoprost (XALATAN) 0.005 % ophthalmic solution Place 1 drop into the right eye at bedtime.    loperamide (IMODIUM) 2 MG capsule Take 2 capsules (4 mg total) by mouth 3 (three) times daily as needed for diarrhea or loose stools.   lovastatin (MEVACOR) 10 MG tablet TAKE 1 TABLET BY MOUTH ONCE DAILY   methenamine (HIPREX) 1 g tablet Take 1 tablet (1 g total) by mouth 2 (two) times daily with a meal.   montelukast (SINGULAIR) 10 MG tablet TAKE 1 TABLET BY MOUTH AT BEDTIME . APPOINTMENT REQUIRED FOR FUTURE REFILLS   Multiple Vitamin (MULTIVITAMIN WITH MINERALS) TABS tablet Take 1 tablet by mouth daily. 3 Fruit Capsules 3 Vegetable Capsules - Balance of Nature   MYRBETRIQ 50 MG TB24 tablet Take 1 tablet by mouth once daily   ondansetron (ZOFRAN) 4 MG tablet Take 1 tablet (4 mg total) by mouth every 8 (eight) hours as needed for nausea or vomiting.   OZEMPIC, 1 MG/DOSE, 4 MG/3ML SOPN INJECT 1 MG  SUBCUTANEOUSLY ONCE A WEEK   pramipexole (MIRAPEX) 0.5 MG tablet TAKE 1 TABLET BY MOUTH AT BEDTIME   Selenium 200 MCG CAPS Take 200 mcg by mouth at bedtime.    Sodium Sulfate-Mag Sulfate-KCl (SUTAB) [phone] MG TABS Use as directed for colonoscopy. MANUFACTURER CODES!! BIN: [id number] PCN: CN GROUP:  [id number] MEMBER ID: [id number];V[employer] AS SECONDARY INSURANCE ;NO PRIOR AUTHORIZATION   SYMBICORT 80-4.5 MCG/ACT inhaler Inhale 2 puffs into the lungs 2 (two) times daily.   tamsulosin (FLOMAX) 0.4 MG CAPS capsule Take 1 capsule by mouth at bedtime   temazepam (RESTORIL) 15 MG capsule Take 1 capsule (15 mg total) by mouth at bedtime.   terconazole (TERAZOL 7) 0.4 % vaginal cream Place 1 applicator vaginally at bedtime.   TRESIBA FLEXTOUCH 200 UNIT/ML FlexTouch Pen INJECT 80 UNITS SUBCUTANEOUSLY IN THE MORNING   valsartan (DIOVAN) 80 MG tablet Take 1 tablet by mouth once daily   vitamin B-12 (CYANOCOBALAMIN) 1000 MCG tablet Take 1,000 mcg by mouth daily.   [DISCONTINUED] PEG 3350-KCl-NaCl-NaSulf-MgSul (SUFLAVE) 178.7 g SOLR Take 1 kit by mouth once for 1 dose.   albuterol (VENTOLIN HFA) 108 (90 Base) MCG/ACT inhaler Inhale 2 puffs into the lungs every 6 (six) hours as needed for wheezing or shortness of breath. (Patient not taking: Reported on 06/14/2023)   [DISCONTINUED] dicyclomine (BENTYL) 20 MG tablet TAKE 1 TABLET BY MOUTH 4 TIMES DAILY (  MORNING,  NOON,  EVENING,  AND  BEDTIME) (Patient not taking: Reported on 06/16/2023)   No facility-administered encounter medications on file as of 07/07/2023.    Allergies as of 07/07/2023 - Review Complete 07/07/2023  Allergen Reaction Noted   Nitrofurantoin Other (See Comments) 10/14/2006   Levaquin [levofloxacin in d5w] Other (See Comments) 12/18/2014   Brimonidine Other (See Comments) 08/02/2012   Cephalexin Diarrhea and Other (See Comments) 10/14/2006   Erythromycin ethylsuccinate Hives and Diarrhea 10/14/2006   Oxycodone Itching 09/11/2019    Past Medical History:  Diagnosis Date   Allergy    Anemia    Anxiety    Arthritis    right knee;injections every 6months    Asthma    use daily   Breast cancer (HCC) 1994/1995   HX BREAST CANCER/ right brreast and left breast in 1995   Bronchiectasis (HCC)    COMPRESSION FRACTURE, LUMBAR VERTEBRAE  08/21/2008   Qualifier: Diagnosis of  By: Lovell Sheehan MD, Balinda Quails    Depression    Diabetes mellitus    takes Amaryl and Januvia daily   Diverticulitis    Dyspnea    with activity   Early cataracts, bilateral    Eczema    Endometriosis    Fatty liver    Gastritis    Glaucoma    Hammer toe    History of kidney stones    Hyperlipidemia associated with type 2 diabetes mellitus (HCC) 07/26/2007   Diet/exercise control.     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   Thyroid disease    Vitamin D deficiency    takes Vit d daily   Weakness    uses a walker and wheelchair    Past Surgical History:  Procedure Laterality Date   ADENOIDECTOMY     at age 2   ANAL FISTULOTOMY N/A 03/07/2020   Procedure: ANAL FISTULOTOMY WITH MARSUPIALIZATION;  Surgeon: Karie Soda, MD;  Location[facility];  Service: General;  Laterality: N/A;   BIOPSY  12/19/2018   Procedure: BIOPSY;  Surgeon: Napoleon Form, MD;  Location: [facility];  Service: Endoscopy;;   CATARACT EXTRACTION     CHOLECYSTECTOMY  06/2008   COLONOSCOPY WITH PROPOFOL N/A 05/17/2017   Procedure: COLONOSCOPY WITH PROPOFOL;  Surgeon: Napoleon Form, MD;  Location: [facility];  Service: Endoscopy;  Laterality: N/A;  PT WILL BE ADMITTED THE DAY BEFORE FOR PREP PER ROBIN KB   COLONOSCOPY WITH PROPOFOL N/A  12/19/2018   Procedure: COLONOSCOPY WITH PROPOFOL;  Surgeon: Napoleon Form, MD;  Location: [facility];  Service: Endoscopy;  Laterality: N/A;   DILATION AND CURETTAGE OF UTERUS     excision on breast     internal infected suture  from breast surgery   excision removed from neck  2008   infected lymph node   EXTRACORPOREAL SHOCK WAVE LITHOTRIPSY Right 07/20/2021   Procedure: EXTRACORPOREAL SHOCK WAVE LITHOTRIPSY (ESWL);  Surgeon: Crist Fat, MD;  Location[facility];  Service: Urology;  Laterality: Right;   EXTRACORPOREAL SHOCK WAVE LITHOTRIPSY Right 10/15/2021   Procedure: EXTRACORPOREAL SHOCK WAVE LITHOTRIPSY (ESWL);  Surgeon: Noel Christmas, MD;  Location[facility];  Service: Urology;  Laterality: Right;   HEMORRHOID SURGERY N/A 11/22/2019   Procedure: HEMORRHOIDECTOMY LIGATION , PEXY;  Surgeon: Karie Soda, MD;  Location: [facility];  Service: General;  Laterality: N/A;   HYPERBARIC OXYGEN THERAPY     FEET INFECTION   HYSTEROSCOPY  06/22/2011   PMB submucosal myoma   JOINT REPLACEMENT  2014   left total shoulder   LAPAROSCOPIC OOPHORECTOMY Right 12/2004   absent LSO   LAPAROTOMY  1977   LIGATION OF INTERNAL FISTULA TRACT N/A 11/22/2019   Procedure: REPAIR OF PERIRECTAL FISTULA, ANORECTAL EXAMINATION UNDER ANESTHESIA;  Surgeon: Karie Soda, MD;  Location: [facility] [place];  Service: General;  Laterality: N/A;   MASTECTOMY Bilateral 1994 right, 1995 left   PLACEMENT OF SETON N/A 03/19/2021   Procedure: LIMBERG RHOMBOID ROTATIONAL FLAP CLOSURE;  Surgeon: Karie Soda, MD;  Location: [facility];  Service: General;  Laterality: N/A;   POLYPECTOMY  12/19/2018   Procedure: POLYPECTOMY;  Surgeon: Napoleon Form, MD;  Location: [facility];  Service: Endoscopy;;   RECTAL EXAM UNDER ANESTHESIA N/A 03/07/2020   Procedure: ANORECTAL EXAM UNDER ANESTHESIA;  Surgeon: Karie Soda, MD;  [facility];  Service: General;  Laterality: N/A;   RECTAL EXAM UNDER ANESTHESIA N/A 03/19/2021   Procedure: RECTAL EXAM UNDER ANESTHESIA;  Surgeon: Karie Soda, MD;  Location: [facility];  Service: General;  Laterality: N/A;   SHOULDER HEMI-ARTHROPLASTY  01/01/2012   Procedure: SHOULDER HEMI-ARTHROPLASTY;  Surgeon: Dominica Severin, MD;  Location: [facility];  Service: Orthopedics;  Laterality: Left;  Left Shoulder Hemi Arthroplasty with Repair and Reconstruction as Necessary    THYROIDECTOMY  1975   18yrs ago. follows endocrine   TONSILLECTOMY     as a child   TOTAL KNEE ARTHROPLASTY Right 12/30/2014   Procedure: RIGHT TOTAL KNEE ARTHROPLASTY;  Surgeon: Ollen Gross, MD;  Location: [facility];  Service: Orthopedics;  Laterality: Right;   TOTAL KNEE ARTHROPLASTY Left 11/29/2016   Procedure: LEFT TOTAL KNEE ARTHROPLASTY;  Surgeon: Ollen Gross, MD;  Location: [facility];  Service: Orthopedics;  Laterality: Left;    Family History  Problem Relation Age of Onset   Arthritis Mother    COPD Father    Heart disease Father        MI 15 - states he died of lockjaw   Hypertension Father    Hyperlipidemia Father    Pancreatic cancer Paternal Grandfather    Colon cancer Neg Hx    Esophageal cancer Neg Hx    Rectal cancer Neg Hx    Stomach cancer Neg Hx     Social History   Socioeconomic History   Marital status: Married    Spouse name: Rosanne Ashing   Number of children: 1   Years of education: Not on file   Highest education level: Not on file  Occupational History   Occupation: retired  Tobacco Use   Smoking status: Never   Smokeless tobacco: Never  Vaping Use   Vaping status: Never Used  Substance and Sexual Activity  Alcohol use: No   Drug use: No   Sexual activity: Not Currently    Partners: Male    Birth control/protection: Post-menopausal, Surgical    Comment: partial hysterectomy  Other Topics Concern   Not on file  Social History Narrative   Married 39 years in 2015. 1 adopted son.  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, [employer], sewing, decorate   Social Drivers of Corporate investment banker Strain: Low Risk  (05/14/2022)   Overall Financial Resource Strain (CARDIA)    Difficulty of Paying Living Expenses: Not hard at all  Food Insecurity: No Food Insecurity (05/14/2022)   Hunger Vital Sign    Worried About Running Out of Food in the Last Year: Never true    Ran Out of Food in the Last Year: Never true  Transportation Needs: No Transportation Needs (05/14/2022)   PRAPARE - Administrator, Civil Service (Medical): No    Lack of Transportation (Non-Medical): No  Physical Activity: Insufficiently Active (05/14/2022)   Exercise Vital Sign    Days of Exercise per Week: 5 days    Minutes of Exercise per Session: 10 min  Stress: No Stress Concern Present (05/14/2022)   [employer] of Occupational Health - Occupational Stress Questionnaire    Feeling of Stress : Only a little  Social Connections: Moderately Integrated (05/14/2022)   Social Connection and Isolation Panel [NHANES]    Frequency of Communication with Friends and Family: Three times a week    Frequency of Social Gatherings with Friends and Family: Once a week    Attends Religious Services: 1 to 4 times per year    Active Member of [employer] or Organizations: No    Attends Banker Meetings: Never    Marital Status: Married  Catering manager Violence: Not At Risk (05/14/2022)   Humiliation, Afraid, Rape, and Kick questionnaire    Fear of Current or Ex-Partner: No    Emotionally Abused: No    Physically Abused: No    Sexually Abused: No      Review of systems: All other review of systems negative except as mentioned in the HPI.   Physical Exam: Vitals:   07/07/23 1020  BP: 122/62  Pulse: 90   Body mass index is 34.99 kg/m. Gen:      No acute distress HEENT:  sclera anicteric Ext:    No edema Neuro: alert and oriented x  3 Psych: normal mood and affect  Data Reviewed:  Reviewed labs, radiology imaging, old records and pertinent past GI work up     Assessment and Plan    Rectocutaneous Fistula Chronic rectocutaneous fistula identified by Dr. Hyacinth Meeker. Previous surgeries by Dr. Michaell Cowing were unsuccessful, leading to significant complications including recurrent UTIs and prolonged recovery. Dr. Cliffton Asters declined further surgery. Dr. Drue Dun, a colorectal surgeon, will evaluate the fistula on March 11.  - Schedule colonoscopy for April 21 at [facility] for better evaluation, exclude any underlying inflammatory bowel disease - Discuss colonoscopy results with Dr. Drue Dun - Continue current antibiotic regimen for UTIs  Recurrent UTIs Recurrent UTIs likely secondary to the rectocutaneous fistula. Managed by Dr. Arita Miss with two antibiotic regimens. - Continue current antibiotic regimen as prescribed by Dr. Arita Miss  Kidney Stones 5 cm kidney stone treated with lithotripsy. Currently asymptomatic and off previous medications. - Monitor for recurrence of symptoms  General Health Maintenance Discussed dietary modifications to  improve overall health and manage weight. Advised against artificial sweeteners and sugary drinks. Recommended increasing water intake and continuing fiber supplements. - Avoid artificial sweeteners, fruit juice, soda, and sweet tea - Increase water intake, possibly with lemon and honey - Continue fiber supplements and a diet rich in fruits, vegetables, nuts, and oatmeal  Follow-up - Follow-up appointment with Dr. Drue Dun on March 11 - Colonoscopy scheduled for April 21 at [facility].     The patient was provided an opportunity to ask questions and all were answered. The patient agreed with the plan and demonstrated an understanding of the instructions.  Iona Beard , MD    CC: Shelva Majestic, MD

## 2023-07-08 DIAGNOSIS — H04123 Dry eye syndrome of bilateral lacrimal glands: Secondary | ICD-10-CM | POA: Diagnosis not present

## 2023-07-08 DIAGNOSIS — H401112 Primary open-angle glaucoma, right eye, moderate stage: Secondary | ICD-10-CM | POA: Diagnosis not present

## 2023-07-08 DIAGNOSIS — H401123 Primary open-angle glaucoma, left eye, severe stage: Secondary | ICD-10-CM | POA: Diagnosis not present

## 2023-07-09 ENCOUNTER — Other Ambulatory Visit: Payer: Self-pay | Admitting: Family Medicine

## 2023-07-11 ENCOUNTER — Other Ambulatory Visit: Payer: Self-pay | Admitting: Family Medicine

## 2023-07-19 ENCOUNTER — Other Ambulatory Visit: Payer: Self-pay | Admitting: Family Medicine

## 2023-07-19 DIAGNOSIS — K60322 Anal fistula, complex, persistent: Secondary | ICD-10-CM | POA: Diagnosis not present

## 2023-07-22 ENCOUNTER — Ambulatory Visit: Payer: Self-pay | Admitting: Adult Health

## 2023-07-22 NOTE — Telephone Encounter (Signed)
 Spoke with [name] regarding Dr. Neville Route note. Pt is going to start some over the counter medications. If she feels otc meds aren't helping she is going to go to urgent care to be tested. Pt advised if she felt her symptoms were worsening she was going to contact us. Pt verbalized understanding and had no further concerns. Nothing further needed at this time.

## 2023-07-22 NOTE — Telephone Encounter (Signed)
Dr. Lamonte Sakai, Please advise.  Thank you.

## 2023-07-22 NOTE — Telephone Encounter (Signed)
 Chief Complaint: productive cough Symptoms: wheezing Frequency: yesterday Pertinent Negatives: Patient denies fever, URI sx, CP Disposition: [] ED /[x] Urgent Care (no appt availability in office) / [] Appointment(In office/virtual)/ []  [facility]/ [] Home Care/ [] Refused Recommended Disposition /[] [facility]/ []  Follow-up with PCP Additional Notes: Pt c/o productive cough since yesterday. Reports that it was clear sputum, and has worsened overnight. Also endorses occasional wheezing at rest. Pt reports taking respiratory inhaler as prescribed, but has not used PRN albuterol in some time and does not know if she has it on hand. Triager reinforced PRN albuterol usage as prescribed. Triager attempted to schedule at pulm and primary PC, but no access. Pt declined alternate PC. Triager attempted to pre-schedule for [facility] closest to pt, but no access to pre-scheduling today. Triager will forward encounter for Tammy, NP to review and advise. Patient verbalized understanding and is expecting call back from office for next steps. Triager also advised that if pt does not hear back from office, to follow disposition for further evaluation/treatment.    Reason for Disposition  Wheezing is present  Answer Assessment - Initial Assessment Questions E2C2 Pulmonary Triage - Initial Assessment Questions "Chief Complaint (e.g., cough, sob, wheezing, fever, chills, sweat or additional symptoms) *Go to specific symptom protocol after initial questions. Productive cough - worsened overnight  "How long have symptoms been present?" yesterday  Have you tested for COVID or Flu? Note: If not, ask patient if a home test can be taken. If so, instruct patient to call back for positive results. No  MEDICINES:   "Have you used any OTC meds to help with symptoms?" No If yes, ask "What medications?" N/a  "Have you used your inhalers/maintenance medication?" Yes If yes, "What  medications?" Symbicort 2 puffs twice daily Albuterol PRN - haven't used, unsure if she has rx  If inhaler, ask "How many puffs and how often?" Note: Review instructions on medication in the chart. See above  OXYGEN: "Do you wear supplemental oxygen?" No If yes, "How many liters are you supposed to use?" N/a  "Do you monitor your oxygen levels?" No If yes, "What is your reading (oxygen level) today?" I haven't recently - I think it needs a new battery  "What is your usual oxygen saturation reading?"  (Note: Pulmonary O2 sats should be 90% or greater) 93% in office   1. ONSET: "When did the cough begin?"      yesterday 2. SEVERITY: "How bad is the cough today?"      Worsened overnight 3. SPUTUM: "Describe the color of your sputum" (none, dry cough; clear, white, yellow, green)     clear 4. HEMOPTYSIS: "Are you coughing up any blood?" If so ask: "How much?" (flecks, streaks, tablespoons, etc.)     no 5. DIFFICULTY BREATHING: "Are you having difficulty breathing?" If Yes, ask: "How bad is it?" (e.g., mild, moderate, severe)    - MILD: No SOB at rest, mild SOB with walking, speaks normally in sentences, can lie down, no retractions, pulse < 100.    - MODERATE: SOB at rest, SOB with minimal exertion and prefers to sit, cannot lie down flat, speaks in phrases, mild retractions, audible wheezing, pulse 100-120.    - SEVERE: Very SOB at rest, speaks in single words, struggling to breathe, sitting hunched forward, retractions, pulse > 120      I don't know - I haven't been up and about today Denies SOB at rest 6. FEVER: "Do you have a fever?" If Yes, ask: "  What is your temperature, how was it measured, and when did it start?"     afebrile 7. CARDIAC HISTORY: "Do you have any history of heart disease?" (e.g., heart attack, congestive heart failure)      CHF - reports no knowledge of HF 8. LUNG HISTORY: "Do you have any history of lung disease?"  (e.g., pulmonary embolus, asthma,  emphysema)     Hx of PNA, bronchitis all my life 9. PE RISK FACTORS: "Do you have a history of blood clots?" (or: recent major surgery, recent prolonged travel, bedridden)     no 10. OTHER SYMPTOMS: "Do you have any other symptoms?" (e.g., runny nose, wheezing, chest pain)       Wheezing - occasionally at rest  12. TRAVEL: "Have you traveled out of the country in the last month?" (e.g., travel history, exposures)       no  Protocols used: Cough - Acute Productive-A-AH

## 2023-07-22 NOTE — Telephone Encounter (Signed)
 Please have her start over-the-counter decongestants like Tylenol Cold and flu temporarily for symptom control. I think it would be good for her to be seen in urgent care to be COVID tested, flu tested.  There may be medications that could be used to get her over this faster if she were positive. If she develops colored mucus, worsening cough, bronchitis symptoms then we need her to call back.  Under those circumstances may need to treat her with an antibiotic.

## 2023-07-23 ENCOUNTER — Other Ambulatory Visit: Payer: Self-pay | Admitting: Urology

## 2023-07-23 ENCOUNTER — Telehealth: Payer: Self-pay | Admitting: Pulmonary Disease

## 2023-07-23 MED ORDER — AMOXICILLIN-POT CLAVULANATE 875-125 MG PO TABS: 1.0000 | ORAL_TABLET | Freq: Two times a day (BID) | ORAL | 0 refills | Status: DC

## 2023-07-23 NOTE — Telephone Encounter (Signed)
 C/o yellow-green phlgm "Ive got a bronchitis'  Zpak does not work  Rx augmentin bid x 7 days to [facility]

## 2023-07-28 ENCOUNTER — Ambulatory Visit (HOSPITAL_COMMUNITY)
Admission: EM | Admit: 2023-07-28 | Discharge: 2023-07-28 | Disposition: A | Discharge status: Home / Self Care | Attending: Nurse Practitioner | Admitting: Nurse Practitioner

## 2023-07-28 ENCOUNTER — Ambulatory Visit: Payer: Self-pay | Admitting: Emergency Medicine

## 2023-07-28 DIAGNOSIS — J209 Acute bronchitis, unspecified: Secondary | ICD-10-CM | POA: Diagnosis not present

## 2023-07-28 MED ORDER — IPRATROPIUM-ALBUTEROL 0.5-2.5 (3) MG/3ML IN SOLN
3.0000 mL | Freq: Once | RESPIRATORY_TRACT | Status: AC
Start: 2023-07-28 — End: 2023-07-28
  Administered 2023-07-28: 3 mL via RESPIRATORY_TRACT

## 2023-07-28 MED ORDER — IPRATROPIUM-ALBUTEROL 0.5-2.5 (3) MG/3ML IN SOLN
RESPIRATORY_TRACT | Status: AC
  Filled 2023-07-28: qty 3

## 2023-07-28 NOTE — Telephone Encounter (Signed)
 Chief Complaint: hypoxia Symptoms: wheezing, SOB with exertion Frequency: a few days Pertinent Negatives: Patient denies fever, CP Disposition: [x] ED /[] Urgent Care (no appt availability in office) / [] Appointment(In office/virtual)/ []  [facility]/ [] Home Care/ [] Refused Recommended Disposition /[] [facility]/ []  Follow-up with PCP Additional Notes: Pt c/o hypoxia and need of oxygen tank. Upon further investigation, pt is being treated with bronchitis and is currently taking abx. Pt endorses SOB with exertion, wheezing and low oxygen levels. Reports low oxygen levels have been 87-89% x 2 days. Pt is not on oxygen at baseline, and was last on O2 s/p procedure and discharged to nursing home on O2 for a short time, then weaned back to baseline on RA. Pt is not taking respiratory tx as prescribed, but has been consitently taking PRN albuterol q6H for wheezing for the past few days. Reports has not taken symbicort since taking albuterol. Triager reinforced respiratory treatment usage. Pt is not currently SOB, but is at rest and has concerns of "passing out" if needing to exert herself. Triager advised to call EMS for further evaluation/tx. Triager called [facility], Kim, to notify of disposition.   Reason for Disposition  Oxygen level (e.g., pulse oximetry) 90 percent or lower  Answer Assessment - Initial Assessment Questions E2C2 Pulmonary Triage - Initial Assessment Questions "Chief Complaint (e.g., cough, sob, wheezing, fever, chills, sweat or additional symptoms) *Go to specific symptom protocol after initial questions. Reports recently dx with bronchitis - started on amoxicillin x 7 - will finish on Saturday  "How long have symptoms been present?" Past few days  Have you tested for COVID or Flu? Note: If not, ask patient if a home test can be taken. If so, instruct patient to call back for positive results. Yes, negative - took prior to asking for abx  MEDICINES:    "Have you used any OTC meds to help with symptoms?" No If yes, ask "What medications?" N/a  "Have you used your inhalers/maintenance medication?" Yes If yes, "What medications?" Albuterol - every 6 PRN - has been taking consistently x a few days Symbicort - 2 puffs twice daily  If inhaler, ask "How many puffs and how often?" Note: Review instructions on medication in the chart. See above  OXYGEN: "Do you wear supplemental oxygen?" No If yes, "How many liters are you supposed to use?" N/a Reports last time had oxygen was post-op, but then returned to RA  "Do you monitor your oxygen levels?" Yes If yes, "What is your reading (oxygen level) today?" 87-89% x 2 days at rest  "What is your usual oxygen saturation reading?"  (Note: Pulmonary O2 sats should be 90% or greater) 93%   1. MAIN CONCERN OR SYMPTOM: "What's your main concern?" (e.g., low oxygen level, breathing difficulty) "What question do you have?"     hypoxia 2. ONSET: "When did the  hypoxia  start?"      A few days ago   7. VSS MONITORING: "Do you monitor/measure your oxygen level or vital signs?" (e.g., yes, no, measurements are automatically sent to provider/call center). Document CURRENT and NORMAL BASELINE values if available.     -  P: "What is your pulse rate per minute?"   -  RR: "What is your respiratory rate per minute?"     P 86 8. BREATHING DIFFICULTY: "Are you having any difficulty breathing?" If Yes, ask: "How bad is it?"  (e.g., none, mild, moderate, severe)    - MILD: No SOB at rest, mild SOB  with walking, speaks normally in sentences, able to lie down, no retractions, pulse < 100.    - MODERATE: SOB at rest, SOB with minimal exertion and prefers to sit, cannot lie down flat, speaks in phrases, mild retractions, audible wheezing, pulse 100-120.    - SEVERE: Very SOB at rest, speaks in single words, struggling to breathe, sitting hunched forward, retractions, pulse > 120      Mild with exertion, OK at  rest 9. OTHER SYMPTOMS: "Do you have any other symptoms?" (e.g., fever, change in sputum)     Afebrile Productive cough - yellow Wheezing - albuterol helps  Protocols used: Oxygen Monitoring and Hypoxia-A-AH

## 2023-07-28 NOTE — Discharge Instructions (Signed)
 Continue Augmentin to completion.  Start using albuterol rescue inhaler every 4-6 hours scheduled instead of just as needed.  We gave you a DuoNeb breathing treatment today which helped increase your oxygen level.  Hydrate with plenty of water and start Mucinex 600 mg twice daily.  Seek care if symptoms worsen.

## 2023-07-28 NOTE — ED Provider Notes (Signed)
 [facility]    CSN: [id number] Arrival date & time: 07/28/23  1539      History   Chief Complaint Chief Complaint  Patient presents with   Shortness of Breath    HPI [name] is a 83 y.o. female.   Patient presents today with approximately 1 week of bronchitis.  Reports her pulmonologist is treating her with Augmentin and she finishes treatment in a couple of days.  Reports has been checking her oxygen level at home and has been maintaining in the mid 80s which is below her baseline of 93%.  Reports she is coughing up thick, discolored mucus and is more tired than normal.  She is also been more short of breath.  No new chest pain or tightness, fever, body aches or chills.  No abdominal pain, nausea/vomiting, or diarrhea.  Reports history of bronchitis but has never been formally diagnosed with COPD or asthma.    Past Medical History:  Diagnosis Date   Allergy    Anemia    Anxiety    Arthritis    right knee;injections every 6months    Asthma    use daily   Breast cancer (HCC) 1994/1995   HX BREAST CANCER/ right brreast and left breast in 1995   Bronchiectasis (HCC)    COMPRESSION FRACTURE, LUMBAR VERTEBRAE 08/21/2008   Qualifier: Diagnosis of  By: Lovell Sheehan MD, Balinda Quails    Depression    Diabetes mellitus    takes Amaryl and Januvia daily   Diverticulitis    Dyspnea    with activity   Early cataracts, bilateral    Eczema    Endometriosis    Fatty liver    Gastritis    Glaucoma    Hammer toe    History of kidney stones    Hyperlipidemia associated with type 2 diabetes mellitus (HCC) 07/26/2007   Diet/exercise control.     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   Thyroid disease    Vitamin D deficiency    takes Vit d daily   Weakness    uses a walker and wheelchair    Patient Active Problem List   Diagnosis Date Noted   Chronic cystitis 11/12/2022   Rectal fistula 11/12/2022   Chronic rhinitis 09/14/2022   Chronic diastolic heart failure (HCC) 09/02/2021   Right lower quadrant abdominal pain 06/16/2021   Perineal sinus 03/25/2021   Chronic UTI 02/23/2021   Hoarseness 10/17/2020   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 11/22/2019   Physical deconditioning 02/22/2019   Dyspnea on exertion 02/22/2019   Adenomatous polyp 12/20/2018   Pain due to onychomycosis of toenails of both feet 11/08/2018   History of total knee replacement, bilateral  11/14/2017   History of colonic polyps 05/18/2017   Arthritis of midfoot 03/03/2016   Hammertoes of both feet 03/03/2016   Aortic atherosclerosis (HCC) 02/16/2016   Solitary pulmonary nodule 01/08/2016   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   Bronchiectasis (HCC) 10/18/2015   IBS (irritable bowel syndrome) 07/17/2014   Recurrent major depression in full remission (HCC) 01/03/2014   Posterior tibial tendon dysfunction 01/03/2014   Osteoarthritis, knee 01/03/2014   Glaucoma 01/03/2014   Essential hypertension 01/03/2014   Obesity (BMI 30-39.9) 06/15/2013   Primary open-angle glaucoma 08/02/2012   Foot tendinitis 07/20/2010   INSOMNIA, CHRONIC 07/25/2009   Fatty liver 03/18/2009   GERD 12/25/2007   Hyperlipidemia 07/26/2007   ANEMIA, B12 DEFICIENCY 12/29/2006   Poorly controlled diabetes mellitus (HCC) 12/28/2006   RESTLESS LEG SYNDROME, MILD 12/28/2006   Idiopathic peripheral  neuropathy 12/28/2006   Osteoporosis 12/12/2006   BREAST CANCER, HX OF 12/12/2006    Past Surgical History:  Procedure Laterality Date   ADENOIDECTOMY     at age 61   ANAL FISTULOTOMY N/A 03/07/2020   Procedure: ANAL FISTULOTOMY WITH MARSUPIALIZATION;  Surgeon: Karie Soda, MD;  Location: [facility];  Service: General;  Laterality: N/A;   BIOPSY  12/19/2018   Procedure: BIOPSY;  Surgeon: Napoleon Form, MD;  Location: [facility];  Service: Endoscopy;;   CATARACT EXTRACTION     CHOLECYSTECTOMY  06/2008   COLONOSCOPY WITH PROPOFOL N/A 05/17/2017   Procedure: COLONOSCOPY WITH PROPOFOL;  Surgeon: Napoleon Form, MD;  Location: [facility];  Service: Endoscopy;  Laterality: N/A;  PT WILL BE ADMITTED THE DAY BEFORE FOR PREP PER ROBIN KB   COLONOSCOPY WITH PROPOFOL N/A 12/19/2018   Procedure: COLONOSCOPY WITH PROPOFOL;  Surgeon: Napoleon Form, MD;  Location: [facility];  Service: Endoscopy;  Laterality: N/A;   DILATION AND CURETTAGE OF UTERUS     excision on breast     internal infected suture from breast surgery   excision removed from neck  2008   infected lymph node   EXTRACORPOREAL SHOCK WAVE LITHOTRIPSY Right 07/20/2021   Procedure: EXTRACORPOREAL SHOCK WAVE LITHOTRIPSY (ESWL);  Surgeon: Crist Fat, MD;  Location: [facility];  Service: Urology;  Laterality: Right;   EXTRACORPOREAL SHOCK WAVE LITHOTRIPSY Right 10/15/2021   Procedure: EXTRACORPOREAL SHOCK WAVE LITHOTRIPSY (ESWL);  Surgeon: Noel Christmas, MD;  Location: [facility];  Service: Urology;  Laterality: Right;   HEMORRHOID SURGERY N/A 11/22/2019   Procedure: HEMORRHOIDECTOMY LIGATION , PEXY;  Surgeon: Karie Soda, MD;  Location: [facility];  Service: General;  Laterality: N/A;   HYPERBARIC OXYGEN THERAPY     FEET INFECTION   HYSTEROSCOPY  06/22/2011   PMB submucosal myoma   JOINT REPLACEMENT  2014   left total shoulder    LAPAROSCOPIC OOPHORECTOMY Right 12/2004   absent LSO   LAPAROTOMY  1977   LIGATION OF INTERNAL FISTULA TRACT N/A 11/22/2019   Procedure: REPAIR OF PERIRECTAL FISTULA, ANORECTAL EXAMINATION UNDER ANESTHESIA;  Surgeon: Karie Soda, MD;  Location: [facility];  Service: General;  Laterality: N/A;   MASTECTOMY Bilateral 1994 right, 1995 left   PLACEMENT OF SETON N/A 03/19/2021   Procedure: LIMBERG RHOMBOID ROTATIONAL FLAP CLOSURE;  Surgeon: Karie Soda, MD;  Location: [facility];  Service: General;  Laterality: N/A;   POLYPECTOMY  12/19/2018   Procedure: POLYPECTOMY;  Surgeon: Napoleon Form, MD;  Location: [facility];  Service: Endoscopy;;   RECTAL EXAM UNDER ANESTHESIA N/A 03/07/2020   Procedure: ANORECTAL  EXAM UNDER ANESTHESIA;  Surgeon: Karie Soda, MD;  Location: [facility];  Service: General;  Laterality: N/A;   RECTAL EXAM UNDER ANESTHESIA N/A 03/19/2021   Procedure: RECTAL EXAM UNDER ANESTHESIA;  Surgeon: Karie Soda, MD;  Location: [facility];  Service: General;  Laterality: N/A;   SHOULDER HEMI-ARTHROPLASTY  01/01/2012   Procedure: SHOULDER HEMI-ARTHROPLASTY;  Surgeon: Dominica Severin, MD;  Location: [facility];  Service: Orthopedics;  Laterality: Left;  Left Shoulder Hemi Arthroplasty with Repair and Reconstruction as Necessary    THYROIDECTOMY  1975   11yrs ago. follows endocrine   TONSILLECTOMY     as a child   TOTAL KNEE ARTHROPLASTY Right 12/30/2014   Procedure: RIGHT TOTAL KNEE ARTHROPLASTY;  Surgeon: Ollen Gross, MD;  Location: [facility];  Service: Orthopedics;  Laterality: Right;   TOTAL KNEE ARTHROPLASTY Left 11/29/2016   Procedure: LEFT TOTAL KNEE ARTHROPLASTY;  Surgeon: Ollen Gross, MD;  Location: [facility];  Service: Orthopedics;  Laterality: Left;    OB History     Gravida  0   Para      Term      Preterm      AB      Living  0      SAB      IAB      Ectopic      Multiple      Live Births           Obstetric  Comments  1 adopted          Home Medications    Prior to Admission medications   Medication Sig Start Date End Date Taking? Authorizing Provider  albuterol (VENTOLIN HFA) 108 (90 Base) MCG/ACT inhaler Inhale 2 puffs into the lungs every 6 (six) hours as needed for wheezing or shortness of breath. Patient not taking: Reported on 06/14/2023 09/14/22   Parrett, Virgel Bouquet, NP  amoxicillin-clavulanate (AUGMENTIN) 875-125 MG tablet Take 1 tablet by mouth 2 (two) times daily. 07/23/23   Oretha Milch, MD  atenolol (TENORMIN) 25 MG tablet Take 1 tablet by mouth once daily 06/22/23   Shelva Majestic, MD  Bacillus Coagulans-Inulin (ALIGN PREBIOTIC-PROBIOTIC PO) Take 1 capsule by mouth daily.     [provider]  BD PEN NEEDLE NANO 2ND GEN 32G X 4 MM MISC USE AS DIRECTED DAILY 05/26/23   Shelva Majestic, MD  cetirizine (ZYRTEC) 10 MG tablet Take 10 mg by mouth every morning.     [provider]  Cholecalciferol (VITAMIN D3) 125 MCG (5000 UT) TABS Take 5,000 Units by mouth daily.    [provider]  Coenzyme Q10 (COQ10) 200 MG CAPS Take 200 mg by mouth daily.    [provider]  desvenlafaxine (PRISTIQ) 50 MG 24 hr tablet Take 1 tablet (50 mg total) by mouth daily. 05/16/23   Shelva Majestic, MD  dorzolamide-timolol (COSOPT) 22.3-6.8 MG/ML ophthalmic solution Place 1 drop into both eyes 2 (two) times daily.  02/08/13   [provider]  estradiol (ESTRING) 7.5 MCG/24HR vaginal ring Place 1 each vaginally every 3 (three) months. 11/09/22   Jerene Bears, MD  FIBER PO Take 2 tablets by mouth every evening.    [provider]  fluconazole (DIFLUCAN) 150 MG tablet Take 1 tablet and repeat in 72 hours 06/20/23   Jerene Bears, MD  fluticasone [facility]) 50 MCG/ACT nasal spray Use 1 spray(s) in each nostril once daily 07/06/23   Byrum, Les Pou, MD  fosfomycin [facility])  3 g PACK Take 3 g by mouth once. Once per month on external instructions- she reports was  told once every 10 days per urology 04/15/23   [provider]  gabapentin (NEURONTIN) 100 MG capsule TAKE 1 CAPSULE BY MOUTH THREE TIMES DAILY AS NEEDED FOR  NEUROPATIC  PAIN. Patient taking differently: TAKE 1 CAPSULE BY MOUTH THREE TIMES DAILY AS NEEDED FOR  NEUROPATIC  PAIN. 11/08/22   Shelva Majestic, MD  glimepiride (AMARYL) 4 MG tablet TAKE 2 TABLETS BY MOUTH ONCE DAILY WITH BREAKFAST 07/11/23   Shelva Majestic, MD  glucose blood (ACCU-CHEK GUIDE) test strip USE 1 STRIP ONCE DAILY TO  CHECK  GLUCOSE. Dx: E11.9 12/27/22   Shelva Majestic, MD  Lancets Misc. (ACCU-CHEK FASTCLIX LANCET) KIT Use to test blood sugars daily. Dx: E11.9 08/23/19   Shelva Majestic, MD  latanoprost (XALATAN) 0.005 % ophthalmic solution Place 1 drop into the right eye at bedtime.  05/20/18   [provider]  loperamide (IMODIUM) 2 MG capsule Take 2 capsules (4 mg total) by mouth 3 (three) times daily as needed for diarrhea or loose stools. 11/25/22   Bing Neighbors, NP  lovastatin (MEVACOR) 10 MG tablet TAKE 1 TABLET BY MOUTH ONCE DAILY 06/27/23   Shelva Majestic, MD  methenamine (HIPREX) 1 g tablet Take 1 tablet (1 g total) by mouth 2 (two) times daily with a meal. 01/27/22   Shelva Majestic, MD  montelukast (SINGULAIR) 10 MG tablet TAKE 1 TABLET BY MOUTH AT BEDTIME . APPOINTMENT REQUIRED FOR FUTURE REFILLS 09/14/22   Parrett, Virgel Bouquet, NP  Multiple Vitamin (MULTIVITAMIN WITH MINERALS) TABS tablet Take 1 tablet by mouth daily. 3 Fruit Capsules 3 Vegetable Capsules - Balance of Nature 03/26/21   Azucena Fallen, MD  MYRBETRIQ 50 MG TB24 tablet Take 1 tablet by mouth once daily 07/26/23   McKenzie, Mardene Celeste, MD  ondansetron (ZOFRAN) 4 MG tablet Take 1 tablet (4 mg total) by mouth every 8 (eight) hours as needed for nausea or vomiting. 11/25/22   Bing Neighbors, NP  OZEMPIC, 1 MG/DOSE, 4 MG/3ML SOPN INJECT 1MG  SUBCUTANEOUSLY ONCE A WEEK 07/11/23   Shelva Majestic, MD  pramipexole (MIRAPEX) 0.5 MG  tablet TAKE 1 TABLET BY MOUTH AT BEDTIME 01/18/23   Shelva Majestic, MD  Selenium 200 MCG CAPS Take 200 mcg by mouth at bedtime.     [provider]  Sodium Sulfate-Mag Sulfate-KCl (SUTAB) [phone] MG TABS Use as directed for colonoscopy. MANUFACTURER CODES!! BIN: [id number] PCN: CN GROUP: [id number] MEMBER ID: [id number];QQV AS SECONDARY INSURANCE ;NO PRIOR AUTHORIZATION 07/07/23   Napoleon Form, MD  SYMBICORT 80-4.5 MCG/ACT inhaler Inhale 2 puffs into the lungs 2 (two) times daily. 09/14/22   Parrett, Virgel Bouquet, NP  tamsulosin (FLOMAX) 0.4 MG CAPS capsule Take 1 capsule by mouth at bedtime 01/24/23   McKenzie, Mardene Celeste, MD  temazepam (RESTORIL) 15 MG capsule Take 1 capsule (15 mg total) by mouth at bedtime. 03/31/23   Shelva Majestic, MD  terconazole (TERAZOL 7) 0.4 % vaginal cream Place 1 applicator vaginally at bedtime. 11/09/22   Jerene Bears, MD  TRESIBA FLEXTOUCH 200 UNIT/ML FlexTouch Pen INJECT 80 UNITS SUBCUTANEOUSLY IN THE MORNING 07/19/23   Shelva Majestic, MD  valsartan (DIOVAN) 80 MG tablet Take 1 tablet by mouth once daily 06/27/23   Shelva Majestic, MD  vitamin B-12 (CYANOCOBALAMIN) 1000 MCG tablet Take 1,000 mcg by mouth daily.  [provider]    Family History Family History  Problem Relation Age of Onset   Arthritis Mother    COPD Father    Heart disease Father        MI 73 - states he died of lockjaw   Hypertension Father    Hyperlipidemia Father    Pancreatic cancer Paternal Grandfather    Colon cancer Neg Hx    Esophageal cancer Neg Hx    Rectal cancer Neg Hx    Stomach cancer Neg Hx     Social History Social History   Tobacco Use   Smoking status: Never   Smokeless tobacco: Never  Vaping Use   Vaping status: Never Used  Substance Use Topics   Alcohol use: No   Drug use: No     Allergies   Nitrofurantoin, Levaquin [levofloxacin in d5w], Brimonidine, Cephalexin, Erythromycin ethylsuccinate, and Oxycodone   Review of  Systems Review of Systems Per HPI  Physical Exam Triage Vital Signs ED Triage Vitals [07/28/23 1544]  Encounter Vitals Group     BP 133/71     Systolic BP Percentile      Diastolic BP Percentile      Pulse Rate 87     Resp 20     Temp 98.2 F (36.8 C)     Temp Source Oral     SpO2 91 %     Weight      Height      Head Circumference      Peak Flow      Pain Score      Pain Loc      Pain Education      Exclude from Growth Chart    No data found.  Updated Vital Signs BP 133/71 (BP Location: Right Wrist)   Pulse 84   Temp 98.2 F (36.8 C) (Oral)   Resp 18   LMP 05/10/1992 Comment: partial  SpO2 96%   Visual Acuity Right Eye Distance:   Left Eye Distance:   Bilateral Distance:    Right Eye Near:   Left Eye Near:    Bilateral Near:     Physical Exam Vitals and nursing note reviewed.  Constitutional:      General: She is not in acute distress.    Appearance: Normal appearance. She is not ill-appearing or toxic-appearing.  HENT:     Head: Normocephalic and atraumatic.     Right Ear: Tympanic membrane, ear canal and external ear normal.     Left Ear: Tympanic membrane, ear canal and external ear normal.     Nose: Congestion present. No rhinorrhea.     Mouth/Throat:     Mouth: Mucous membranes are moist.     Pharynx: Oropharynx is clear. Posterior oropharyngeal erythema present. No oropharyngeal exudate.  Eyes:     General: No scleral icterus.    Extraocular Movements: Extraocular movements intact.  Cardiovascular:     Rate and Rhythm: Normal rate and regular rhythm.  Pulmonary:     Effort: Pulmonary effort is normal. No respiratory distress.     Breath sounds: Wheezing present. No decreased breath sounds, rhonchi or rales.  Musculoskeletal:     Cervical back: Normal range of motion and neck supple.  Lymphadenopathy:     Cervical: No cervical adenopathy.  Skin:    General: Skin is warm and dry.     Coloration: Skin is not jaundiced or pale.      Findings: No erythema or rash.  Neurological:  Mental Status: She is alert and oriented to person, place, and time.  Psychiatric:        Behavior: Behavior is cooperative.      UC Treatments / Results  Labs (all labs ordered are listed, but only abnormal results are displayed) Labs Reviewed - No data to display  EKG   Radiology No results found.  Procedures Procedures (including critical care time)  Medications Ordered in UC Medications  ipratropium-albuterol (DUONEB) 0.5-2.5 (3) MG/3ML nebulizer solution 3 mL (3 mLs Nebulization Given 07/28/23 1608)    Initial Impression / Assessment and Plan / UC Course  I have reviewed the triage vital signs and the nursing notes.  Pertinent labs & imaging results that were available during my care of the patient were reviewed by me and considered in my medical decision making (see chart for details).   Patient is well-appearing, normotensive, afebrile, not tachycardic, not tachypneic, oxygenating well on room air.    1. Acute bronchitis, unspecified organism In triage, SpO2 low 90% on room air and patient wheezing After DuoNeb, SpO2 increased to 96% on room air and patient reported much improvement Recommended continuing Augmentin to completion, start albuterol inhaler scheduled for the next 48 hours, then use as needed Patient reports she does not tolerate prednisone well and declines prednisone prescription today Also recommended guaifenesin, hydration Strict ER and return precautions discussed  The patient was given the opportunity to ask questions.  All questions answered to their satisfaction.  The patient is in agreement to this plan.   Final Clinical Impressions(s) / UC Diagnoses   Final diagnoses:  Acute bronchitis, unspecified organism     Discharge Instructions      Continue Augmentin to completion.  Start using albuterol rescue inhaler every 4-6 hours scheduled instead of just as needed.  We gave you a DuoNeb  breathing treatment today which helped increase your oxygen level.  Hydrate with plenty of water and start Mucinex 600 mg twice daily.  Seek care if symptoms worsen.   ED Prescriptions   None    PDMP not reviewed this encounter.   Valentino Nose, NP 07/28/23 2006

## 2023-07-28 NOTE — ED Triage Notes (Signed)
 Patient had bronchitis diagnosed last week. Completed her course of antibiotics this past Saturday. Still having a productive cough with fatigue and [place]/green mucus. Has been SOB and O2 stats have been in the mid 80s according to home pulse ox.   Denies history of COPD or asthma. Does have history of re-occurring bronchitis and pneumonia. Not on home oxygen.

## 2023-07-30 NOTE — Telephone Encounter (Signed)
 Thank you :)

## 2023-08-01 ENCOUNTER — Other Ambulatory Visit: Payer: Self-pay | Admitting: Family Medicine

## 2023-08-02 DIAGNOSIS — D259 Leiomyoma of uterus, unspecified: Secondary | ICD-10-CM | POA: Diagnosis not present

## 2023-08-02 DIAGNOSIS — K603 Anal fistula, unspecified: Secondary | ICD-10-CM | POA: Diagnosis not present

## 2023-08-02 DIAGNOSIS — N888 Other specified noninflammatory disorders of cervix uteri: Secondary | ICD-10-CM | POA: Diagnosis not present

## 2023-08-02 DIAGNOSIS — K5730 Diverticulosis of large intestine without perforation or abscess without bleeding: Secondary | ICD-10-CM | POA: Diagnosis not present

## 2023-08-07 ENCOUNTER — Other Ambulatory Visit: Payer: Self-pay | Admitting: Family Medicine

## 2023-08-12 ENCOUNTER — Ambulatory Visit: Payer: Medicare PPO | Admitting: Gastroenterology

## 2023-08-12 ENCOUNTER — Encounter: Payer: Self-pay | Admitting: Gastroenterology

## 2023-08-12 VITALS — BP 116/68 | HR 67

## 2023-08-12 DIAGNOSIS — K635 Polyp of colon: Secondary | ICD-10-CM | POA: Diagnosis not present

## 2023-08-12 DIAGNOSIS — Z792 Long term (current) use of antibiotics: Secondary | ICD-10-CM | POA: Diagnosis not present

## 2023-08-12 DIAGNOSIS — K604 Rectal fistula, unspecified: Secondary | ICD-10-CM | POA: Diagnosis not present

## 2023-08-12 NOTE — H&P (View-Only) (Signed)
 [name]    [id number]    [birth year]  Primary Care Physician:Hunter, Aldine Contes, MD  Referring Physician: Shelva Majestic, MD [address] [place],  [place] [address]   Chief complaint: Rectocutaneous fistula  Discussed the use of AI scribe software for clinical note transcription with the patient, who gave verbal consent to proceed.  History of Present Illness [name] "Delray Alt" is an 83 year old female with a rectal cutaneous fistula who presents for follow-up on MRI results and potential colonoscopy.  She has a history of a rectal cutaneous fistula, which continues to leak primarily after bowel movements, though not into her diaper. There is no bleeding or stomach pain, and the fistula is not connected to the vagina. She is concerned about the fistula not closing on its own and the potential need for surgery.  She is frustrated with the delay in receiving MRI results, which were performed on August 02, 2023, at [facility]. The MRI experience was negative due to discomfort and inadequate assistance from the staff, exacerbating her back pain and causing shoulder discomfort. She is scheduled for a colonoscopy on August 29, 2023, but is uncertain if it will be necessary depending on the MRI results. She recalls a previous colonoscopy that revealed three polyps, which may need follow-up.  She has a history of recurrent urinary tract infections (UTIs) attributed to the fistula. She is currently on antibiotics to manage the UTIs and has no recent UTI symptoms.   CT abdomen pelvis with contrast 06/18/2021 1. Mild right urothelial hyperenhancement seen at the renal pelvis, findings can be seen in the setting of infection. Correlate with urinalysis. 2. Prominent right extrarenal pelvis with nonobstructing right nephrolithiasis. 3. Trace left pleural effusion, decreased in size when compared with prior chest CT. 4. Aortic Atherosclerosis (ICD10-I70.0).    Colonoscopy December 19, 2018 - Three 4 to 7 mm polyps in the transverse colon and in the cecum, removed with a cold snare. Resected and retrieved. - One 2 mm polyp in the transverse colon, removed with a cold biopsy forceps. Resected and retrieved. - Diverticulosis in the sigmoid colon and in the descending colon. - Non- bleeding internal hemorrhoids. - The examination was otherwise normal.   11/22/19 -  PROCEDURE: By Dr. Michaell Cowing LIFT REPAIR OF PERIRECTAL FISTULA HEMORRHOIDECTOMY EXCISION OF ANAL POLYP HEMORRHOID LIGATION & PEXY ANORECTAL EXAMINATION UNDER ANESTHESIA  03/07/20 PROCEDURE: Dr. Michaell Cowing ANORECTAL EXAM UNDER ANESTHESIA ANAL FISTULOTOMY WITH MARSUPIALIZATION  03/19/21 PROCEDURE: By Dr. Michaell Cowing ANORECTAL EXAM UNDER ANESTHESIA LIMBERG RHOMBOID ROTATIONAL FLAP CLOSURE    Outpatient Encounter Medications as of 08/12/2023  Medication Sig   albuterol (VENTOLIN HFA) 108 (90 Base) MCG/ACT inhaler Inhale 2 puffs into the lungs every 6 (six) hours as needed for wheezing or shortness of breath.   atenolol (TENORMIN) 25 MG tablet Take 1 tablet by mouth once daily   Bacillus Coagulans-Inulin (ALIGN PREBIOTIC-PROBIOTIC PO) Take 1 capsule by mouth daily.    BD PEN NEEDLE NANO 2ND GEN 32G X 4 MM MISC USE AS DIRECTED DAILY   cetirizine (ZYRTEC) 10 MG tablet Take 10 mg by mouth every morning.    Cholecalciferol (VITAMIN D3) 125 MCG (5000 UT) TABS Take 5,000 Units by mouth daily.   Coenzyme Q10 (COQ10) 200 MG CAPS Take 200 mg by mouth daily.   desvenlafaxine (PRISTIQ) 50 MG 24 hr tablet Take 1 tablet (50 mg total) by mouth daily.   dorzolamide-timolol (COSOPT) 22.3-6.8  MG/ML ophthalmic solution Place 1 drop into both eyes 2 (two) times daily.    estradiol (ESTRING) 7.5 MCG/24HR vaginal ring Place 1 each vaginally every 3 (three) months.   FIBER PO Take 2 tablets by mouth every evening.   fluconazole (DIFLUCAN) 150 MG tablet Take 1 tablet and repeat in 72 hours   fluticasone (FLONASE) 50 MCG/ACT  nasal spray Use 1 spray(s) in each nostril once daily   fosfomycin (MONUROL) 3 g PACK Take 3 g by mouth once. Once per month on external instructions- she reports was told once every 10 days per urology   gabapentin (NEURONTIN) 100 MG capsule TAKE 1 CAPSULE BY MOUTH THREE TIMES DAILY AS NEEDED FOR  NEUROPATIC  PAIN. (Patient taking differently: TAKE 1 CAPSULE BY MOUTH THREE TIMES DAILY AS NEEDED FOR  NEUROPATIC  PAIN.)   glimepiride (AMARYL) 4 MG tablet TAKE 2 TABLETS BY MOUTH ONCE DAILY WITH BREAKFAST   glucose blood (ACCU-CHEK GUIDE) test strip USE 1 STRIP ONCE DAILY TO  CHECK  GLUCOSE. Dx: E11.9   Lancets Misc. (ACCU-CHEK FASTCLIX LANCET) KIT Use to test blood sugars daily. Dx: E11.9   latanoprost (XALATAN) 0.005 % ophthalmic solution Place 1 drop into the right eye at bedtime.    loperamide (IMODIUM) 2 MG capsule Take 2 capsules (4 mg total) by mouth 3 (three) times daily as needed for diarrhea or loose stools.   lovastatin (MEVACOR) 10 MG tablet TAKE 1 TABLET BY MOUTH ONCE DAILY   methenamine (HIPREX) 1 g tablet Take 1 tablet (1 g total) by mouth 2 (two) times daily with a meal.   montelukast (SINGULAIR) 10 MG tablet TAKE 1 TABLET BY MOUTH AT BEDTIME . APPOINTMENT REQUIRED FOR FUTURE REFILLS   Multiple Vitamin (MULTIVITAMIN WITH MINERALS) TABS tablet Take 1 tablet by mouth daily. 3 Fruit Capsules 3 Vegetable Capsules - Balance of Nature   MYRBETRIQ 50 MG TB24 tablet Take 1 tablet by mouth once daily   ondansetron (ZOFRAN) 4 MG tablet Take 1 tablet (4 mg total) by mouth every 8 (eight) hours as needed for nausea or vomiting.   OZEMPIC, 1 MG/DOSE, 4 MG/3ML SOPN INJECT 1 MG  SUBCUTANEOUSLY ONCE A WEEK   pramipexole (MIRAPEX) 0.5 MG tablet TAKE 1 TABLET BY MOUTH AT BEDTIME   Selenium 200 MCG CAPS Take 200 mcg by mouth at bedtime.    Sodium Sulfate-Mag Sulfate-KCl (SUTAB) [id number] MG TABS Use as directed for colonoscopy. MANUFACTURER CODES!! BIN: [id number] PCN: CN GROUP: [id number] MEMBER ID:  [id number];[employer] AS SECONDARY INSURANCE ;NO PRIOR AUTHORIZATION   SYMBICORT 80-4.5 MCG/ACT inhaler Inhale 2 puffs into the lungs 2 (two) times daily.   tamsulosin (FLOMAX) 0.4 MG CAPS capsule Take 1 capsule by mouth at bedtime   temazepam (RESTORIL) 15 MG capsule Take 1 capsule (15 mg total) by mouth at bedtime.   terconazole (TERAZOL 7) 0.4 % vaginal cream Place 1 applicator vaginally at bedtime.   TRESIBA FLEXTOUCH 200 UNIT/ML FlexTouch Pen INJECT 80 UNITS SUBCUTANEOUSLY IN THE MORNING   valsartan (DIOVAN) 80 MG tablet Take 1 tablet by mouth once daily   vitamin B-12 (CYANOCOBALAMIN) 1000 MCG tablet Take 1,000 mcg by mouth daily.   [DISCONTINUED] amoxicillin-clavulanate (AUGMENTIN) 875-125 MG tablet Take 1 tablet by mouth 2 (two) times daily.   No facility-administered encounter medications on file as of 08/12/2023.    Allergies as of 08/12/2023 - Review Complete 08/12/2023  Allergen Reaction Noted   Nitrofurantoin Other (See Comments) 10/14/2006   Levaquin [levofloxacin in d5w] Other (See Comments) 12/18/2014  Brimonidine Other (See Comments) 08/02/2012   Cephalexin Diarrhea and Other (See Comments) 10/14/2006   Erythromycin ethylsuccinate Hives and Diarrhea 10/14/2006   Oxycodone Itching 09/11/2019    Past Medical History:  Diagnosis Date   Allergy    Anemia    Anxiety    Arthritis    right knee;injections every 6months    Asthma    use daily   Breast cancer (HCC) 1994/1995   HX BREAST CANCER/ right brreast and left breast in 1995   Bronchiectasis (HCC)    COMPRESSION FRACTURE, LUMBAR VERTEBRAE 08/21/2008   Qualifier: Diagnosis of  By: Lovell Sheehan MD, Balinda Quails    Depression    Diabetes mellitus    takes Amaryl and Januvia daily   Diverticulitis    Dyspnea    with activity   Early cataracts, bilateral    Eczema    Endometriosis    Fatty liver    Gastritis    Glaucoma    Hammer toe    History of kidney stones    Hyperlipidemia associated with type 2 diabetes mellitus  (HCC) 07/26/2007   Diet/exercise control.     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   Thyroid disease    Vitamin D deficiency    takes Vit d daily   Weakness    uses a walker and wheelchair    Past Surgical History:  Procedure Laterality Date   ADENOIDECTOMY     at age 89   ANAL FISTULOTOMY N/A 03/07/2020   Procedure: ANAL FISTULOTOMY WITH MARSUPIALIZATION;  Surgeon: Karie Soda, MD;  Location: [facility];  Service: General;  Laterality: N/A;   BIOPSY  12/19/2018   Procedure: BIOPSY;  Surgeon: Napoleon Form, MD;  Location: [facility];  Service: Endoscopy;;   CATARACT EXTRACTION     CHOLECYSTECTOMY  06/2008   COLONOSCOPY WITH PROPOFOL N/A 05/17/2017   Procedure: COLONOSCOPY WITH PROPOFOL;  Surgeon: Napoleon Form, MD;  Location: [facility];  Service: Endoscopy;  Laterality: N/A;  PT WILL BE ADMITTED THE DAY BEFORE FOR PREP PER ROBIN KB   COLONOSCOPY WITH PROPOFOL N/A 12/19/2018   Procedure: COLONOSCOPY WITH PROPOFOL;  Surgeon: Napoleon Form, MD;  Location: [facility];  Service: Endoscopy;  Laterality: N/A;   DILATION AND CURETTAGE OF UTERUS     excision on breast     internal infected suture from breast surgery   excision removed from neck  2008   infected lymph node   EXTRACORPOREAL SHOCK WAVE LITHOTRIPSY Right 07/20/2021   Procedure:  EXTRACORPOREAL SHOCK WAVE LITHOTRIPSY (ESWL);  Surgeon: Crist Fat, MD;  Location: [facility];  Service: Urology;  Laterality: Right;   EXTRACORPOREAL SHOCK WAVE LITHOTRIPSY Right 10/15/2021  Procedure: EXTRACORPOREAL SHOCK WAVE LITHOTRIPSY (ESWL);  Surgeon: Noel Christmas, MD;  Location: [facility];  Service: Urology;  Laterality: Right;   HEMORRHOID SURGERY N/A 11/22/2019   Procedure: HEMORRHOIDECTOMY LIGATION , PEXY;  Surgeon: Karie Soda, MD;  Location: [facility];  Service: General;  Laterality: N/A;   HYPERBARIC OXYGEN THERAPY     FEET INFECTION   HYSTEROSCOPY  06/22/2011   PMB submucosal myoma   JOINT REPLACEMENT  2014   left total shoulder   LAPAROSCOPIC OOPHORECTOMY Right 12/2004   absent LSO   LAPAROTOMY  1977   LIGATION OF INTERNAL FISTULA TRACT N/A 11/22/2019   Procedure: REPAIR OF PERIRECTAL FISTULA, ANORECTAL EXAMINATION UNDER ANESTHESIA;  Surgeon: Karie Soda, MD;  Location[facility];  Service: General;  Laterality: N/A;   MASTECTOMY Bilateral 1994 right, 1995 left   PLACEMENT OF SETON N/A 03/19/2021   Procedure: LIMBERG RHOMBOID ROTATIONAL FLAP CLOSURE;  Surgeon: Karie Soda, MD;  Location: [facility];  Service: General;  Laterality: N/A;   POLYPECTOMY  12/19/2018   Procedure: POLYPECTOMY;  Surgeon: Napoleon Form, MD;  Location: [facility];  Service: Endoscopy;;   RECTAL EXAM UNDER ANESTHESIA N/A 03/07/2020   Procedure: ANORECTAL EXAM UNDER ANESTHESIA;  Surgeon: Karie Soda, MD;  Location: [facility];  Service: General;  Laterality: N/A;   RECTAL EXAM UNDER ANESTHESIA N/A 03/19/2021   Procedure: RECTAL EXAM UNDER ANESTHESIA;  Surgeon: Karie Soda, MD;  Location: [facility];  Service: General;  Laterality: N/A;   SHOULDER HEMI-ARTHROPLASTY  01/01/2012   Procedure: SHOULDER HEMI-ARTHROPLASTY;  Surgeon: Dominica Severin, MD;  Location: [facility];  Service: Orthopedics;  Laterality:  Left;  Left Shoulder Hemi Arthroplasty with Repair and Reconstruction as Necessary    THYROIDECTOMY  1975   23yrs ago. follows endocrine   TONSILLECTOMY     as a child   TOTAL KNEE ARTHROPLASTY Right 12/30/2014   Procedure: RIGHT TOTAL KNEE ARTHROPLASTY;  Surgeon: Ollen Gross, MD;  Location: [facility];  Service: Orthopedics;  Laterality: Right;   TOTAL KNEE ARTHROPLASTY Left 11/29/2016   Procedure: LEFT TOTAL KNEE ARTHROPLASTY;  Surgeon: Ollen Gross, MD;  Location: [facility];  Service: Orthopedics;  Laterality: Left;    Family History  Problem Relation Age of Onset   Arthritis Mother    COPD Father    Heart disease Father        MI 68 - states he died of lockjaw   Hypertension Father    Hyperlipidemia Father    Pancreatic cancer Paternal Grandfather    Colon cancer Neg Hx    Esophageal cancer Neg Hx    Rectal cancer Neg Hx    Stomach cancer Neg Hx     Social History   Socioeconomic History   Marital status: Married    Spouse name: Rosanne Ashing   Number of children: 1   Years of education: Not on file   Highest education level: Not on file  Occupational History   Occupation: retired  Tobacco Use   Smoking status: Never   Smokeless tobacco: Never  Vaping Use   Vaping status: Never Used  Substance and Sexual Activity   Alcohol use: No   Drug use: No   Sexual activity: Not Currently    Partners: Male    Birth control/protection: Post-menopausal, Surgical    Comment: partial hysterectomy  Other Topics Concern   Not on file  Social History Narrative   Married 39 years in 2015. 1 adopted son. 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, [employer], sewing, decorate   Social Drivers of Corporate investment banker Strain: Low Risk  (05/14/2022)   Overall Financial Resource Strain (CARDIA)    Difficulty of Paying Living Expenses: Not hard at all  Food Insecurity: Low Risk  (07/19/2023)   Received from [facility]   Hunger Vital Sign     Worried About Running Out of Food in the Last Year: Never true    Ran Out of Food in the Last Year: Never true  Transportation Needs: No Transportation Needs (07/19/2023)   Received from [facility]    In the past 12 months, has lack of reliable transportation kept you from medical appointments, meetings, work or from getting things needed for daily living? : No  Physical Activity: Insufficiently Active (05/14/2022)   Exercise Vital Sign    Days of Exercise per Week: 5 days    Minutes of Exercise per Session: 10 min  Stress: No Stress Concern Present (05/14/2022)   [employer] of Occupational Health - Occupational Stress Questionnaire    Feeling of Stress : Only a little  Social Connections: Moderately Integrated (05/14/2022)   Social Connection and Isolation Panel [NHANES]    Frequency of Communication with Friends and Family: Three times a week    Frequency of Social Gatherings with Friends and Family: Once a week    Attends Religious Services: 1 to 4 times per year    Active Member of [employer] or Organizations: No    Attends Banker Meetings: Never    Marital Status: Married  Catering manager Violence: Not At Risk (05/14/2022)   Humiliation, Afraid, Rape, and Kick questionnaire    Fear of Current or Ex-Partner: No    Emotionally Abused: No    Physically Abused: No    Sexually Abused: No      Review of systems: All other review of systems negative except as mentioned in the HPI.   Physical Exam: Vitals:   08/12/23 1444  BP: 116/68  Pulse: 67   There is no height or weight on file to calculate BMI. Gen:      No acute distress HEENT:  sclera anicteric Neuro: alert and oriented x 3 Psych: normal mood and affect  Data Reviewed:  Reviewed labs, radiology imaging, old records and pertinent past GI work up     Assessment and Plan Assessment & Plan Rectal Cutaneous Fistula She has a rectal cutaneous fistula causing leakage, particularly  post-defecation, without bleeding or abdominal pain. The fistula is not connected to the vagina, which is favorable. There is a concern about the need for surgical intervention due to the fistula's persistence and history of UTIs. Spontaneous closure is unlikely. Surgical options, depend on MRI findings. Previous surgeries have led to complications, necessitating careful planning to avoid further issues. - Await MRI results to determine the necessity of a colonoscopy. - Keep the colonoscopy scheduled for August 29, 2023, but cancel if deemed unnecessary after MRI results. - Maintain the virtual visit with Dr. Drue Dun to discuss MRI results  - Keep the in-person visit with Dr. Joycelyn Das assistant on August 30, 2023, to discuss further management and potential surgery. - Continue antibiotics to manage UTIs.  Urinary Tract Infections (UTIs) UTIs are related to the rectal cutaneous fistula. She is currently on antibiotics with no recent specimens indicating active infection. - Continue antibiotics to manage UTIs.  Colonoscopy Follow-Up A previous colonoscopy revealed three polyps. A follow-up colonoscopy is scheduled to assess the polyps and gather additional information for fistula management. It may be canceled if MRI results indicate it is unnecessary. - Keep the colonoscopy scheduled for August 29, 2023, but cancel if deemed unnecessary after MRI results.  Goals of Care She prefers avoiding unnecessary procedures and emphasizes careful decision-making given her age. The importance of considering the least invasive options and avoiding surgery unless absolutely necessary is discussed. The potential for the fistula to close on its own is considered, but if intervention is needed, the least disruptive option will be chosen. - Discuss the results of the MRI and the necessity of further procedures during the telephone visit and in-person visit.    This visit required >30 minutes of patient care (this  includes precharting, chart review, review of results, face-to-face time used for counseling as well as treatment plan and follow-up. The patient was provided an opportunity to ask questions and all were answered. The patient agreed with the plan and demonstrated an understanding of the instructions.  Iona Beard , MD    CC: Shelva Majestic, MD

## 2023-08-12 NOTE — Progress Notes (Signed)
 [name]    [id number]    [birth year]  Primary Care Physician:Hunter, Aldine Contes, MD  Referring Physician: Shelva Majestic, MD [address] [place],  [place] [address]   Chief complaint: Rectocutaneous fistula  Discussed the use of AI scribe software for clinical note transcription with the patient, who gave verbal consent to proceed.  History of Present Illness [name] "[name]" is an 83 year old female with a rectal cutaneous fistula who presents for follow-up on MRI results and potential colonoscopy.  She has a history of a rectal cutaneous fistula, which continues to leak primarily after bowel movements, though not into her diaper. There is no bleeding or stomach pain, and the fistula is not connected to the vagina. She is concerned about the fistula not closing on its own and the potential need for surgery.  She is frustrated with the delay in receiving MRI results, which were performed on August 02, 2023, at [facility]. The MRI experience was negative due to discomfort and inadequate assistance from the staff, exacerbating her back pain and causing shoulder discomfort. She is scheduled for a colonoscopy on August 29, 2023, but is uncertain if it will be necessary depending on the MRI results. She recalls a previous colonoscopy that revealed three polyps, which may need follow-up.  She has a history of recurrent urinary tract infections (UTIs) attributed to the fistula. She is currently on antibiotics to manage the UTIs and has no recent UTI symptoms.   CT abdomen pelvis with contrast 06/18/2021 1. Mild right urothelial hyperenhancement seen at the renal pelvis, findings can be seen in the setting of infection. Correlate with urinalysis. 2. Prominent right extrarenal pelvis with nonobstructing right nephrolithiasis. 3. Trace left pleural effusion, decreased in size when compared with prior chest CT. 4. Aortic Atherosclerosis (ICD10-I70.0).    Colonoscopy December 19, 2018 - Three 4 to 7 mm polyps in the transverse colon and in the cecum, removed with a cold snare. Resected and retrieved. - One 2 mm polyp in the transverse colon, removed with a cold biopsy forceps. Resected and retrieved. - Diverticulosis in the sigmoid colon and in the descending colon. - Non- bleeding internal hemorrhoids. - The examination was otherwise normal.   11/22/19 -  PROCEDURE: By Dr. Michaell Cowing LIFT REPAIR OF PERIRECTAL FISTULA HEMORRHOIDECTOMY EXCISION OF ANAL POLYP HEMORRHOID LIGATION & PEXY ANORECTAL EXAMINATION UNDER ANESTHESIA  03/07/20 PROCEDURE: Dr. Michaell Cowing ANORECTAL EXAM UNDER ANESTHESIA ANAL FISTULOTOMY WITH MARSUPIALIZATION  03/19/21 PROCEDURE: By Dr. Michaell Cowing ANORECTAL EXAM UNDER ANESTHESIA LIMBERG RHOMBOID ROTATIONAL FLAP CLOSURE    Outpatient Encounter Medications as of 08/12/2023  Medication Sig   albuterol (VENTOLIN HFA) 108 (90 Base) MCG/ACT inhaler Inhale 2 puffs into the lungs every 6 (six) hours as needed for wheezing or shortness of breath.   atenolol (TENORMIN) 25 MG tablet Take 1 tablet by mouth once daily   Bacillus Coagulans-Inulin (ALIGN PREBIOTIC-PROBIOTIC PO) Take 1 capsule by mouth daily.    BD PEN NEEDLE NANO 2ND GEN 32G X 4 MM MISC USE AS DIRECTED DAILY   cetirizine (ZYRTEC) 10 MG tablet Take 10 mg by mouth every morning.    Cholecalciferol (VITAMIN D3) 125 MCG (5000 UT) TABS Take 5,000 Units by mouth daily.   Coenzyme Q10 (COQ10) 200 MG CAPS Take 200 mg by mouth daily.   desvenlafaxine (PRISTIQ) 50 MG 24 hr tablet Take 1 tablet (50 mg total) by mouth daily.   dorzolamide-timolol (COSOPT) 22.3-6.8  MG/ML ophthalmic solution Place 1 drop into both eyes 2 (two) times daily.    estradiol (ESTRING) 7.5 MCG/24HR vaginal ring Place 1 each vaginally every 3 (three) months.   FIBER PO Take 2 tablets by mouth every evening.   fluconazole (DIFLUCAN) 150 MG tablet Take 1 tablet and repeat in 72 hours   fluticasone (FLONASE) 50 MCG/ACT  nasal spray Use 1 spray(s) in each nostril once daily   fosfomycin (MONUROL) 3 g PACK Take 3 g by mouth once. Once per month on external instructions- she reports was told once every 10 days per urology   gabapentin (NEURONTIN) 100 MG capsule TAKE 1 CAPSULE BY MOUTH THREE TIMES DAILY AS NEEDED FOR  NEUROPATIC  PAIN. (Patient taking differently: TAKE 1 CAPSULE BY MOUTH THREE TIMES DAILY AS NEEDED FOR  NEUROPATIC  PAIN.)   glimepiride (AMARYL) 4 MG tablet TAKE 2 TABLETS BY MOUTH ONCE DAILY WITH BREAKFAST   glucose blood (ACCU-CHEK GUIDE) test strip USE 1 STRIP ONCE DAILY TO  CHECK  GLUCOSE. Dx: E11.9   Lancets Misc. (ACCU-CHEK FASTCLIX LANCET) KIT Use to test blood sugars daily. Dx: E11.9   latanoprost (XALATAN) 0.005 % ophthalmic solution Place 1 drop into the right eye at bedtime.    loperamide (IMODIUM) 2 MG capsule Take 2 capsules (4 mg total) by mouth 3 (three) times daily as needed for diarrhea or loose stools.   lovastatin (MEVACOR) 10 MG tablet TAKE 1 TABLET BY MOUTH ONCE DAILY   methenamine (HIPREX) 1 g tablet Take 1 tablet (1 g total) by mouth 2 (two) times daily with a meal.   montelukast (SINGULAIR) 10 MG tablet TAKE 1 TABLET BY MOUTH AT BEDTIME . APPOINTMENT REQUIRED FOR FUTURE REFILLS   Multiple Vitamin (MULTIVITAMIN WITH MINERALS) TABS tablet Take 1 tablet by mouth daily. 3 Fruit Capsules 3 Vegetable Capsules - Balance of Nature   MYRBETRIQ 50 MG TB24 tablet Take 1 tablet by mouth once daily   ondansetron (ZOFRAN) 4 MG tablet Take 1 tablet (4 mg total) by mouth every 8 (eight) hours as needed for nausea or vomiting.   OZEMPIC, 1 MG/DOSE, 4 MG/3ML SOPN INJECT 1 MG  SUBCUTANEOUSLY ONCE A WEEK   pramipexole (MIRAPEX) 0.5 MG tablet TAKE 1 TABLET BY MOUTH AT BEDTIME   Selenium 200 MCG CAPS Take 200 mcg by mouth at bedtime.    Sodium Sulfate-Mag Sulfate-KCl (SUTAB) [id number] MG TABS Use as directed for colonoscopy. MANUFACTURER CODES!! BIN: [id number] PCN: CN GROUP: [id number] MEMBER ID:  [id number];[employer] AS SECONDARY INSURANCE ;NO PRIOR AUTHORIZATION   SYMBICORT 80-4.5 MCG/ACT inhaler Inhale 2 puffs into the lungs 2 (two) times daily.   tamsulosin (FLOMAX) 0.4 MG CAPS capsule Take 1 capsule by mouth at bedtime   temazepam (RESTORIL) 15 MG capsule Take 1 capsule (15 mg total) by mouth at bedtime.   terconazole (TERAZOL 7) 0.4 % vaginal cream Place 1 applicator vaginally at bedtime.   TRESIBA FLEXTOUCH 200 UNIT/ML FlexTouch Pen INJECT 80 UNITS SUBCUTANEOUSLY IN THE MORNING   valsartan (DIOVAN) 80 MG tablet Take 1 tablet by mouth once daily   vitamin B-12 (CYANOCOBALAMIN) 1000 MCG tablet Take 1,000 mcg by mouth daily.   [DISCONTINUED] amoxicillin-clavulanate (AUGMENTIN) 875-125 MG tablet Take 1 tablet by mouth 2 (two) times daily.   No facility-administered encounter medications on file as of 08/12/2023.    Allergies as of 08/12/2023 - Review Complete 08/12/2023  Allergen Reaction Noted   Nitrofurantoin Other (See Comments) 10/14/2006   Levaquin [levofloxacin in d5w] Other (See Comments) 12/18/2014  Brimonidine Other (See Comments) 08/02/2012   Cephalexin Diarrhea and Other (See Comments) 10/14/2006   Erythromycin ethylsuccinate Hives and Diarrhea 10/14/2006   Oxycodone Itching 09/11/2019    Past Medical History:  Diagnosis Date   Allergy    Anemia    Anxiety    Arthritis    right knee;injections every 6months    Asthma    use daily   Breast cancer (HCC) 1994/1995   HX BREAST CANCER/ right brreast and left breast in 1995   Bronchiectasis (HCC)    COMPRESSION FRACTURE, LUMBAR VERTEBRAE 08/21/2008   Qualifier: Diagnosis of  By: Lovell Sheehan MD, Balinda Quails    Depression    Diabetes mellitus    takes Amaryl and Januvia daily   Diverticulitis    Dyspnea    with activity   Early cataracts, bilateral    Eczema    Endometriosis    Fatty liver    Gastritis    Glaucoma    Hammer toe    History of kidney stones    Hyperlipidemia associated with type 2 diabetes mellitus  (HCC) 07/26/2007   Diet/exercise control.     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   Thyroid disease    Vitamin D deficiency    takes Vit d daily   Weakness    uses a walker and wheelchair    Past Surgical History:  Procedure Laterality Date   ADENOIDECTOMY     at age 89   ANAL FISTULOTOMY N/A 03/07/2020   Procedure: ANAL FISTULOTOMY WITH MARSUPIALIZATION;  Surgeon: Karie Soda, MD;  Location: [facility];  Service: General;  Laterality: N/A;   BIOPSY  12/19/2018   Procedure: BIOPSY;  Surgeon: Napoleon Form, MD;  Location: [facility];  Service: Endoscopy;;   CATARACT EXTRACTION     CHOLECYSTECTOMY  06/2008   COLONOSCOPY WITH PROPOFOL N/A 05/17/2017   Procedure: COLONOSCOPY WITH PROPOFOL;  Surgeon: Napoleon Form, MD;  Location: [facility];  Service: Endoscopy;  Laterality: N/A;  PT WILL BE ADMITTED THE DAY BEFORE FOR PREP PER ROBIN KB   COLONOSCOPY WITH PROPOFOL N/A 12/19/2018   Procedure: COLONOSCOPY WITH PROPOFOL;  Surgeon: Napoleon Form, MD;  Location: [facility];  Service: Endoscopy;  Laterality: N/A;   DILATION AND CURETTAGE OF UTERUS     excision on breast     internal infected suture from breast surgery   excision removed from neck  2008   infected lymph node   EXTRACORPOREAL SHOCK WAVE LITHOTRIPSY Right 07/20/2021   Procedure:  EXTRACORPOREAL SHOCK WAVE LITHOTRIPSY (ESWL);  Surgeon: Crist Fat, MD;  Location: [facility];  Service: Urology;  Laterality: Right;   EXTRACORPOREAL SHOCK WAVE LITHOTRIPSY Right 10/15/2021  Procedure: EXTRACORPOREAL SHOCK WAVE LITHOTRIPSY (ESWL);  Surgeon: Noel Christmas, MD;  Location: [facility];  Service: Urology;  Laterality: Right;   HEMORRHOID SURGERY N/A 11/22/2019   Procedure: HEMORRHOIDECTOMY LIGATION , PEXY;  Surgeon: Karie Soda, MD;  Location: [facility];  Service: General;  Laterality: N/A;   HYPERBARIC OXYGEN THERAPY     FEET INFECTION   HYSTEROSCOPY  06/22/2011   PMB submucosal myoma   JOINT REPLACEMENT  2014   left total shoulder   LAPAROSCOPIC OOPHORECTOMY Right 12/2004   absent LSO   LAPAROTOMY  1977   LIGATION OF INTERNAL FISTULA TRACT N/A 11/22/2019   Procedure: REPAIR OF PERIRECTAL FISTULA, ANORECTAL EXAMINATION UNDER ANESTHESIA;  Surgeon: Karie Soda, MD;  Location[facility];  Service: General;  Laterality: N/A;   MASTECTOMY Bilateral 1994 right, 1995 left   PLACEMENT OF SETON N/A 03/19/2021   Procedure: LIMBERG RHOMBOID ROTATIONAL FLAP CLOSURE;  Surgeon: Karie Soda, MD;  Location: [facility];  Service: General;  Laterality: N/A;   POLYPECTOMY  12/19/2018   Procedure: POLYPECTOMY;  Surgeon: Napoleon Form, MD;  Location: [facility];  Service: Endoscopy;;   RECTAL EXAM UNDER ANESTHESIA N/A 03/07/2020   Procedure: ANORECTAL EXAM UNDER ANESTHESIA;  Surgeon: Karie Soda, MD;  Location: [facility];  Service: General;  Laterality: N/A;   RECTAL EXAM UNDER ANESTHESIA N/A 03/19/2021   Procedure: RECTAL EXAM UNDER ANESTHESIA;  Surgeon: Karie Soda, MD;  Location: [facility];  Service: General;  Laterality: N/A;   SHOULDER HEMI-ARTHROPLASTY  01/01/2012   Procedure: SHOULDER HEMI-ARTHROPLASTY;  Surgeon: Dominica Severin, MD;  Location: [facility];  Service: Orthopedics;  Laterality:  Left;  Left Shoulder Hemi Arthroplasty with Repair and Reconstruction as Necessary    THYROIDECTOMY  1975   23yrs ago. follows endocrine   TONSILLECTOMY     as a child   TOTAL KNEE ARTHROPLASTY Right 12/30/2014   Procedure: RIGHT TOTAL KNEE ARTHROPLASTY;  Surgeon: Ollen Gross, MD;  Location: [facility];  Service: Orthopedics;  Laterality: Right;   TOTAL KNEE ARTHROPLASTY Left 11/29/2016   Procedure: LEFT TOTAL KNEE ARTHROPLASTY;  Surgeon: Ollen Gross, MD;  Location: [facility];  Service: Orthopedics;  Laterality: Left;    Family History  Problem Relation Age of Onset   Arthritis Mother    COPD Father    Heart disease Father        MI 68 - states he died of lockjaw   Hypertension Father    Hyperlipidemia Father    Pancreatic cancer Paternal Grandfather    Colon cancer Neg Hx    Esophageal cancer Neg Hx    Rectal cancer Neg Hx    Stomach cancer Neg Hx     Social History   Socioeconomic History   Marital status: Married    Spouse name: Rosanne Ashing   Number of children: 1   Years of education: Not on file   Highest education level: Not on file  Occupational History   Occupation: retired  Tobacco Use   Smoking status: Never   Smokeless tobacco: Never  Vaping Use   Vaping status: Never Used  Substance and Sexual Activity   Alcohol use: No   Drug use: No   Sexual activity: Not Currently    Partners: Male    Birth control/protection: Post-menopausal, Surgical    Comment: partial hysterectomy  Other Topics Concern   Not on file  Social History Narrative   Married 39 years in 2015. 1 adopted son. 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, [employer], sewing, decorate   Social Drivers of Corporate investment banker Strain: Low Risk  (05/14/2022)   Overall Financial Resource Strain (CARDIA)    Difficulty of Paying Living Expenses: Not hard at all  Food Insecurity: Low Risk  (07/19/2023)   Received from [facility]   Hunger Vital Sign     Worried About Running Out of Food in the Last Year: Never true    Ran Out of Food in the Last Year: Never true  Transportation Needs: No Transportation Needs (07/19/2023)   Received from [facility]    In the past 12 months, has lack of reliable transportation kept you from medical appointments, meetings, work or from getting things needed for daily living? : No  Physical Activity: Insufficiently Active (05/14/2022)   Exercise Vital Sign    Days of Exercise per Week: 5 days    Minutes of Exercise per Session: 10 min  Stress: No Stress Concern Present (05/14/2022)   [employer] of Occupational Health - Occupational Stress Questionnaire    Feeling of Stress : Only a little  Social Connections: Moderately Integrated (05/14/2022)   Social Connection and Isolation Panel [NHANES]    Frequency of Communication with Friends and Family: Three times a week    Frequency of Social Gatherings with Friends and Family: Once a week    Attends Religious Services: 1 to 4 times per year    Active Member of [employer] or Organizations: No    Attends Banker Meetings: Never    Marital Status: Married  Catering manager Violence: Not At Risk (05/14/2022)   Humiliation, Afraid, Rape, and Kick questionnaire    Fear of Current or Ex-Partner: No    Emotionally Abused: No    Physically Abused: No    Sexually Abused: No      Review of systems: All other review of systems negative except as mentioned in the HPI.   Physical Exam: Vitals:   08/12/23 1444  BP: 116/68  Pulse: 67   There is no height or weight on file to calculate BMI. Gen:      No acute distress HEENT:  sclera anicteric Neuro: alert and oriented x 3 Psych: normal mood and affect  Data Reviewed:  Reviewed labs, radiology imaging, old records and pertinent past GI work up     Assessment and Plan Assessment & Plan Rectal Cutaneous Fistula She has a rectal cutaneous fistula causing leakage, particularly  post-defecation, without bleeding or abdominal pain. The fistula is not connected to the vagina, which is favorable. There is a concern about the need for surgical intervention due to the fistula's persistence and history of UTIs. Spontaneous closure is unlikely. Surgical options, depend on MRI findings. Previous surgeries have led to complications, necessitating careful planning to avoid further issues. - Await MRI results to determine the necessity of a colonoscopy. - Keep the colonoscopy scheduled for August 29, 2023, but cancel if deemed unnecessary after MRI results. - Maintain the virtual visit with Dr. Drue Dun to discuss MRI results  - Keep the in-person visit with Dr. Joycelyn Das assistant on August 30, 2023, to discuss further management and potential surgery. - Continue antibiotics to manage UTIs.  Urinary Tract Infections (UTIs) UTIs are related to the rectal cutaneous fistula. She is currently on antibiotics with no recent specimens indicating active infection. - Continue antibiotics to manage UTIs.  Colonoscopy Follow-Up A previous colonoscopy revealed three polyps. A follow-up colonoscopy is scheduled to assess the polyps and gather additional information for fistula management. It may be canceled if MRI results indicate it is unnecessary. - Keep the colonoscopy scheduled for August 29, 2023, but cancel if deemed unnecessary after MRI results.  Goals of Care She prefers avoiding unnecessary procedures and emphasizes careful decision-making given her age. The importance of considering the least invasive options and avoiding surgery unless absolutely necessary is discussed. The potential for the fistula to close on its own is considered, but if intervention is needed, the least disruptive option will be chosen. - Discuss the results of the MRI and the necessity of further procedures during the telephone visit and in-person visit.    This visit required >30 minutes of patient care (this  includes precharting, chart review, review of results, face-to-face time used for counseling as well as treatment plan and follow-up. The patient was provided an opportunity to ask questions and all were answered. The patient agreed with the plan and demonstrated an understanding of the instructions.  Iona Beard , MD    CC: Shelva Majestic, MD

## 2023-08-12 NOTE — Patient Instructions (Addendum)
 VISIT SUMMARY:  During your visit, we discussed the results of your MRI and the potential need for a colonoscopy. We also reviewed your ongoing issues with a rectal cutaneous fistula and recurrent urinary tract infections (UTIs).  YOUR PLAN:  -RECTAL CUTANEOUS FISTULA: A rectal cutaneous fistula is an abnormal connection between the rectum and the skin, causing leakage, especially after bowel movements. We are waiting for the MRI results to decide if a colonoscopy is necessary. Your colonoscopy is scheduled for August 29, 2023, but it may be canceled based on the MRI findings. You will have a telephone visit with Dr. Drue Dun to discuss the MRI results and a follow-up in-person visit on August 30, 2023, to talk about further management and potential surgery. Continue taking your antibiotics to manage UTIs.  -URINARY TRACT INFECTIONS (UTIS): UTIs are infections in any part of the urinary system. Your UTIs are related to the rectal cutaneous fistula. Continue taking your antibiotics to manage these infections.  -COLONOSCOPY FOLLOW-UP: A colonoscopy is a procedure to examine the inside of your colon. Your previous colonoscopy found three polyps, and a follow-up is scheduled for August 29, 2023, to check on them and gather more information for managing your fistula. This may be canceled if the MRI results show it is unnecessary.  INSTRUCTIONS:  Please keep your scheduled colonoscopy on August 29, 2023, unless we inform you otherwise based on the MRI results. Attend your telephone visit with Dr. Drue Dun to discuss the MRI results and the potential need for the colonoscopy. Also, keep your in-person visit with Dr. Joycelyn Das assistant on August 30, 2023, to discuss further management and potential surgery.  I appreciate the  opportunity to care for you  Thank You   Marsa Aris , MD

## 2023-08-15 ENCOUNTER — Encounter: Payer: Self-pay | Admitting: Gastroenterology

## 2023-08-15 DIAGNOSIS — H401112 Primary open-angle glaucoma, right eye, moderate stage: Secondary | ICD-10-CM | POA: Diagnosis not present

## 2023-08-17 DIAGNOSIS — K604 Rectal fistula, unspecified: Secondary | ICD-10-CM | POA: Diagnosis not present

## 2023-08-17 DIAGNOSIS — Z860100 Personal history of colon polyps, unspecified: Secondary | ICD-10-CM | POA: Diagnosis not present

## 2023-08-18 ENCOUNTER — Telehealth: Payer: Self-pay | Admitting: Gastroenterology

## 2023-08-18 DIAGNOSIS — N3020 Other chronic cystitis without hematuria: Secondary | ICD-10-CM | POA: Diagnosis not present

## 2023-08-18 NOTE — Telephone Encounter (Signed)
Called the patient. No answer. No voicemail.

## 2023-08-18 NOTE — Telephone Encounter (Signed)
 Requesting to speak with a nurse in regards to having a UTI please advise.

## 2023-08-21 ENCOUNTER — Other Ambulatory Visit: Payer: Self-pay | Admitting: Urology

## 2023-08-22 ENCOUNTER — Other Ambulatory Visit: Payer: Self-pay

## 2023-08-22 ENCOUNTER — Telehealth: Payer: Self-pay

## 2023-08-22 MED ORDER — SUTAB 1479-225-188 MG PO TABS: 24.0000 | ORAL_TABLET | ORAL | 0 refills | Status: DC

## 2023-08-22 NOTE — Telephone Encounter (Signed)
 Procedure:colon Procedure date: 09/01/23 Procedure location: [facility] Arrival Time: 745 am Spoke with the patient Y/N: y Any prep concerns? Yes pt never receive prep so I called it in to her main pharmacy  .Has the patient obtained the prep from the pharmacy ? n Do you have a care partner and transportation: y Any additional concerns? n

## 2023-08-22 NOTE — Telephone Encounter (Signed)
 Call placed to the pt and no answer no voice mail  Will await further communication from the pt

## 2023-08-22 NOTE — Telephone Encounter (Signed)
Line rings no answer no voice mail  °

## 2023-08-22 NOTE — Telephone Encounter (Signed)
Patient called requesting to speak with nurse.  Please advise.

## 2023-08-25 ENCOUNTER — Encounter: Payer: Medicare PPO | Admitting: Family Medicine

## 2023-08-25 ENCOUNTER — Telehealth: Payer: Self-pay | Admitting: Gastroenterology

## 2023-08-25 NOTE — Telephone Encounter (Signed)
 Patient called and stated that she has a procedure at the hospital coming up and would like to go over what medication she is allowed to take. Patient is requesting a call back. Please advise.

## 2023-08-25 NOTE — Telephone Encounter (Signed)
 Patient has a colonoscopy at [facility] on 08/29/23. Confirmed she still has her written instructions. Confirmed she last took Ozempic on 08/22/23 and knows she is not to take it again until after her procedure. Advised the patient she will be contacted by a nurse from [facility] with specific medication instructions. The patient does not have a voicemail Advised the patient she will need to answer her calls or she will miss the call.

## 2023-08-29 ENCOUNTER — Other Ambulatory Visit: Payer: Self-pay | Admitting: Family Medicine

## 2023-08-29 ENCOUNTER — Encounter (HOSPITAL_COMMUNITY): Payer: Self-pay | Admitting: Registered Nurse

## 2023-08-29 ENCOUNTER — Telehealth: Payer: Self-pay

## 2023-08-29 ENCOUNTER — Other Ambulatory Visit: Payer: Self-pay

## 2023-08-29 ENCOUNTER — Encounter (HOSPITAL_COMMUNITY): Payer: Self-pay | Admitting: Gastroenterology

## 2023-08-29 ENCOUNTER — Encounter (HOSPITAL_COMMUNITY)
Admission: RE | Disposition: A | Discharge status: Home / Self Care | Payer: Self-pay | Source: Home / Self Care | Attending: Gastroenterology

## 2023-08-29 ENCOUNTER — Ambulatory Visit (HOSPITAL_COMMUNITY)
Admission: RE | Admit: 2023-08-29 | Discharge: 2023-08-29 | Disposition: A | Discharge status: Home / Self Care | Payer: Medicare PPO | Attending: Gastroenterology | Admitting: Gastroenterology

## 2023-08-29 DIAGNOSIS — Z1211 Encounter for screening for malignant neoplasm of colon: Secondary | ICD-10-CM | POA: Diagnosis not present

## 2023-08-29 DIAGNOSIS — Z538 Procedure and treatment not carried out for other reasons: Secondary | ICD-10-CM | POA: Diagnosis not present

## 2023-08-29 DIAGNOSIS — Z860100 Personal history of colon polyps, unspecified: Secondary | ICD-10-CM | POA: Insufficient documentation

## 2023-08-29 SURGERY — CANCELLED PROCEDURE

## 2023-08-29 MED ORDER — ONDANSETRON 4 MG PO TBDP: 4.0000 mg | ORAL_TABLET | Freq: Four times a day (QID) | ORAL | 0 refills | Status: DC | PRN

## 2023-08-29 SURGICAL SUPPLY — 19 items
ELECT REM PT RETURN 9FT ADLT (ELECTROSURGICAL) IMPLANT
ELECTRODE REM PT RTRN 9FT ADLT (ELECTROSURGICAL) IMPLANT
FLOOR PAD 36X40 (MISCELLANEOUS) ×2 IMPLANT
FORCEPS BIOP RAD 4 LRG CAP 4 (CUTTING FORCEPS) IMPLANT
FORCEPS BIOP RJ4 240 W/NDL (CUTTING FORCEPS) IMPLANT
FORCEPS BXJMBJMB 240X2.8X (CUTTING FORCEPS) IMPLANT
INJECTOR/SNARE I SNARE (MISCELLANEOUS) IMPLANT
LUBRICANT JELLY 4.5OZ STERILE (MISCELLANEOUS) IMPLANT
MANIFOLD NEPTUNE II (INSTRUMENTS) IMPLANT
NEEDLE SCLEROTHERAPY 25GX240 (NEEDLE) IMPLANT
PAD FLOOR 36X40 (MISCELLANEOUS) ×2 IMPLANT
PROBE APC STR FIRE (PROBE) IMPLANT
PROBE INJECTION GOLD 7FR (MISCELLANEOUS) IMPLANT
SNARE ROTATE MED OVAL 20MM (MISCELLANEOUS) IMPLANT
SYR 50ML LL SCALE MARK (SYRINGE) IMPLANT
TRAP SPECIMEN MUCOUS 40CC (MISCELLANEOUS) IMPLANT
TUBING ENDO SMARTCAP PENTAX (MISCELLANEOUS) IMPLANT
TUBING IRRIGATION ENDOGATOR (MISCELLANEOUS) ×2 IMPLANT
WATER STERILE IRR 1000ML POUR (IV SOLUTION) IMPLANT

## 2023-08-29 NOTE — Anesthesia Preprocedure Evaluation (Signed)
 Anesthesia Evaluation    Airway        Dental   Pulmonary shortness of breath and with exertion, asthma           Cardiovascular hypertension, Pt. on medications and Pt. on home beta blockers +CHF    EKG 09/02/21 NSR, Normal  Echo 11/27/20 1. Left ventricular ejection fraction, by estimation, is 60 to 65%. The  left ventricle has normal function. The left ventricle has no regional  wall motion abnormalities. Left ventricular diastolic parameters are  consistent with Grade I diastolic  dysfunction (impaired relaxation).   2. Right ventricular systolic function is normal. The right ventricular  size is normal.   3. The mitral valve is normal in structure. Trivial mitral valve  regurgitation. No evidence of mitral stenosis.   4. The aortic valve is tricuspid. Aortic valve regurgitation is not  visualized. No aortic stenosis is present.      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 Restless legs syndrome  Neuromuscular disease    GI/Hepatic ,GERD  Medicated,,Hx/o hepatic steatosis IBS  Hx/o Colon polyps Hx/o anal fistula Hx/o diverticulitis   Endo/Other  diabetes, Poorly Controlled, Type 2, Oral Hypoglycemic Agents  Obesity HLD GLP-1 RA therapy- Last dose Hx/o Breast Ca S/P bilateral mastectomy + ChemoRx  Renal/GU Renal diseaseHx/o renal calculi  negative genitourinary   Musculoskeletal  (+) Arthritis , Osteoarthritis,    Abdominal   Peds  Hematology  (+) Blood dyscrasia, anemia   Anesthesia Other Findings   Reproductive/Obstetrics                              Anesthesia Physical Anesthesia Plan Anesthesia Quick Evaluation

## 2023-08-29 NOTE — Progress Notes (Signed)
 Patient came into hospital for outpatient procedure and was having Rissmiller stools prior to colonoscopy. Additional prep to be given overnight and to remain on clear liquids. Patient agreeable for outpatient colonoscopy tomorrow at 8:30. Instructions given to patient to be at hospital by 7:30 in the morning on August 30, 2023.

## 2023-08-29 NOTE — Telephone Encounter (Signed)
 This is a patient of Dr Leonia Raman. Patient was at [facility] this morning for her colonoscopy. Her prep was not adequate and she was sent home on clear liquids, instructions for Miralax  prep and scheduled to return tomorrow. [name] is calling feeling weak, nauseated and concerned she is not going to be able to continue with clear liquids. She is diabetic. She reports her fingerstick glucose was 72. She has only had water . Discussed broth and other clear liquids for her to try.  Needing ondansetron  for nausea, please. Okay to send?

## 2023-08-29 NOTE — Telephone Encounter (Signed)
 Spoke with the patient. Broth has helped. She is still a little queasy. Rx to pharmacy to have on hand.

## 2023-08-30 ENCOUNTER — Ambulatory Visit (HOSPITAL_COMMUNITY): Admitting: Anesthesiology

## 2023-08-30 ENCOUNTER — Encounter (HOSPITAL_COMMUNITY): Payer: Self-pay | Admitting: Gastroenterology

## 2023-08-30 ENCOUNTER — Other Ambulatory Visit: Payer: Self-pay

## 2023-08-30 ENCOUNTER — Ambulatory Visit (HOSPITAL_COMMUNITY)
Admission: RE | Admit: 2023-08-30 | Discharge: 2023-08-30 | Disposition: A | Discharge status: Home / Self Care | Attending: Gastroenterology | Admitting: Gastroenterology

## 2023-08-30 ENCOUNTER — Encounter (HOSPITAL_COMMUNITY)
Admission: RE | Disposition: A | Discharge status: Home / Self Care | Payer: Self-pay | Source: Home / Self Care | Attending: Gastroenterology

## 2023-08-30 DIAGNOSIS — K621 Rectal polyp: Secondary | ICD-10-CM | POA: Diagnosis not present

## 2023-08-30 DIAGNOSIS — I509 Heart failure, unspecified: Secondary | ICD-10-CM | POA: Insufficient documentation

## 2023-08-30 DIAGNOSIS — K648 Other hemorrhoids: Secondary | ICD-10-CM | POA: Diagnosis not present

## 2023-08-30 DIAGNOSIS — E669 Obesity, unspecified: Secondary | ICD-10-CM | POA: Insufficient documentation

## 2023-08-30 DIAGNOSIS — K589 Irritable bowel syndrome without diarrhea: Secondary | ICD-10-CM | POA: Insufficient documentation

## 2023-08-30 DIAGNOSIS — I5032 Chronic diastolic (congestive) heart failure: Secondary | ICD-10-CM

## 2023-08-30 DIAGNOSIS — Z1211 Encounter for screening for malignant neoplasm of colon: Secondary | ICD-10-CM

## 2023-08-30 DIAGNOSIS — Z7985 Long-term (current) use of injectable non-insulin antidiabetic drugs: Secondary | ICD-10-CM | POA: Insufficient documentation

## 2023-08-30 DIAGNOSIS — I110 Hypertensive heart disease with heart failure: Secondary | ICD-10-CM | POA: Insufficient documentation

## 2023-08-30 DIAGNOSIS — K635 Polyp of colon: Secondary | ICD-10-CM

## 2023-08-30 DIAGNOSIS — E1165 Type 2 diabetes mellitus with hyperglycemia: Secondary | ICD-10-CM | POA: Insufficient documentation

## 2023-08-30 DIAGNOSIS — D123 Benign neoplasm of transverse colon: Secondary | ICD-10-CM | POA: Insufficient documentation

## 2023-08-30 DIAGNOSIS — K219 Gastro-esophageal reflux disease without esophagitis: Secondary | ICD-10-CM | POA: Diagnosis not present

## 2023-08-30 DIAGNOSIS — D128 Benign neoplasm of rectum: Secondary | ICD-10-CM | POA: Diagnosis not present

## 2023-08-30 DIAGNOSIS — R159 Full incontinence of feces: Secondary | ICD-10-CM | POA: Diagnosis not present

## 2023-08-30 DIAGNOSIS — J45909 Unspecified asthma, uncomplicated: Secondary | ICD-10-CM | POA: Insufficient documentation

## 2023-08-30 DIAGNOSIS — K5730 Diverticulosis of large intestine without perforation or abscess without bleeding: Secondary | ICD-10-CM | POA: Diagnosis not present

## 2023-08-30 DIAGNOSIS — K604 Rectal fistula, unspecified: Secondary | ICD-10-CM | POA: Insufficient documentation

## 2023-08-30 DIAGNOSIS — K6289 Other specified diseases of anus and rectum: Secondary | ICD-10-CM | POA: Diagnosis not present

## 2023-08-30 DIAGNOSIS — Z7984 Long term (current) use of oral hypoglycemic drugs: Secondary | ICD-10-CM | POA: Diagnosis not present

## 2023-08-30 DIAGNOSIS — Z860100 Personal history of colon polyps, unspecified: Secondary | ICD-10-CM

## 2023-08-30 DIAGNOSIS — R933 Abnormal findings on diagnostic imaging of other parts of digestive tract: Secondary | ICD-10-CM

## 2023-08-30 HISTORY — PX: COLONOSCOPY: SHX5424

## 2023-08-30 HISTORY — PX: POLYPECTOMY: SHX149

## 2023-08-30 LAB — GLUCOSE, CAPILLARY
Glucose-Capillary: 66 mg/dL — ABNORMAL LOW (ref 70–99)
Glucose-Capillary: 96 mg/dL (ref 70–99)
Glucose-Capillary: 80 mg/dL (ref 70–99)

## 2023-08-30 SURGERY — COLONOSCOPY
Anesthesia: Monitor Anesthesia Care

## 2023-08-30 MED ORDER — DEXTROSE 50 % IV SOLN
12.5000 g | INTRAVENOUS | Status: AC
  Administered 2023-08-30: 12.5 g via INTRAVENOUS

## 2023-08-30 MED ORDER — SODIUM CHLORIDE 0.9 % IV SOLN
INTRAVENOUS | Status: DC
Start: 2023-08-30 — End: 2023-08-30

## 2023-08-30 MED ORDER — PROPOFOL 10 MG/ML IV BOLUS
INTRAVENOUS | Status: DC | PRN
  Administered 2023-08-30: 70 mg via INTRAVENOUS
  Administered 2023-08-30 (×2): 30 mg via INTRAVENOUS
  Administered 2023-08-30: 20 mg via INTRAVENOUS
  Administered 2023-08-30: 30 mg via INTRAVENOUS

## 2023-08-30 MED ORDER — LIDOCAINE 2% (20 MG/ML) 5 ML SYRINGE
INTRAMUSCULAR | Status: DC | PRN
  Administered 2023-08-30: 100 mg via INTRAVENOUS

## 2023-08-30 MED ORDER — DEXTROSE 50 % IV SOLN
INTRAVENOUS | Status: AC
  Filled 2023-08-30: qty 50

## 2023-08-30 NOTE — Discharge Instructions (Signed)
 YOU HAD AN ENDOSCOPIC PROCEDURE TODAY: Refer to the procedure report and other information in the discharge instructions given to you for any specific questions about what was found during the examination. If this information does not answer your questions, please call [place] office at [phone] to clarify.   YOU SHOULD EXPECT: Some feelings of bloating in the abdomen. Passage of more gas than usual. Walking can help get rid of the air that was put into your GI tract during the procedure and reduce the bloating. If you had a lower endoscopy (such as a colonoscopy or flexible sigmoidoscopy) you may notice spotting of blood in your stool or on the toilet paper. Some abdominal soreness may be present for a day or two, also.  DIET: Your first meal following the procedure should be a light meal and then it is ok to progress to your normal diet. A half-sandwich or bowl of soup is an example of a good first meal. Heavy or fried foods are harder to digest and may make you feel nauseous or bloated. Drink plenty of fluids but you should avoid alcoholic beverages for 24 hours. If you had a esophageal dilation, please see attached instructions for diet.    ACTIVITY: Your care partner should take you home directly after the procedure. You should plan to take it easy, moving slowly for the rest of the day. You can resume normal activity the day after the procedure however YOU SHOULD NOT DRIVE, use power tools, machinery or perform tasks that involve climbing or major physical exertion for 24 hours (because of the sedation medicines used during the test).   SYMPTOMS TO REPORT IMMEDIATELY: A gastroenterologist can be reached at any hour. Please call [phone]  for any of the following symptoms:  Following lower endoscopy (colonoscopy, flexible sigmoidoscopy) Excessive amounts of blood in the stool  Significant tenderness, worsening of abdominal pains  Swelling of the abdomen that is new, acute  Fever of 100 or  higher   FOLLOW UP:  If any biopsies were taken you will be contacted by phone or by letter within the next 1-3 weeks. Call [phone]  if you have not heard about the biopsies in 3 weeks.  Please also call with any specific questions about appointments or follow up tests.

## 2023-08-30 NOTE — Transfer of Care (Signed)
 Immediate Anesthesia Transfer of Care Note  Patient: [name]  Procedure(s) Performed: COLONOSCOPY POLYPECTOMY, INTESTINE  Patient Location: PACU  Anesthesia Type:MAC  Level of Consciousness: awake, alert , and oriented  Airway & Oxygen  Therapy: Patient Spontanous Breathing and Patient connected to nasal cannula oxygen   Post-op Assessment: Report given to RN and Post -op Vital signs reviewed and stable  Post vital signs: Reviewed and stable  Last Vitals:  Vitals Value Taken Time  BP    Temp 35.7 C 08/30/23 0901  Pulse 82 08/30/23 0904  Resp 13 08/30/23 0904  SpO2 94 % 08/30/23 0904  Vitals shown include unfiled device data.  Last Pain:  Vitals:   08/30/23 0901  TempSrc: Temporal         Complications: No notable events documented.

## 2023-08-30 NOTE — Interval H&P Note (Signed)
 History and Physical Interval Note:  08/30/2023 8:32 AM  [name]  has presented today for surgery, with the diagnosis of screening colonoscopy.  The various methods of treatment have been discussed with the patient and family. After consideration of risks, benefits and other options for treatment, the patient has consented to  Procedure(s): COLONOSCOPY (N/A) as a surgical intervention.  The patient's history has been reviewed, patient examined, no change in status, stable for surgery.  I have reviewed the patient's chart and labs.  Questions were answered to the patient's satisfaction.     Saverio Kader

## 2023-08-30 NOTE — Anesthesia Preprocedure Evaluation (Signed)
 Anesthesia Evaluation  Patient identified by MRN, date of birth, ID band Patient awake    Reviewed: Allergy & Precautions, NPO status , Patient's Chart, lab work & pertinent test results  Airway Mallampati: III  TM Distance: >3 FB Neck ROM: Full    Dental  (+) Dental Advisory Given   Pulmonary shortness of breath and with exertion, asthma    breath sounds clear to auscultation       Cardiovascular hypertension, Pt. on medications and Pt. on home beta blockers +CHF   Rhythm:Regular Rate:Normal  EKG 09/02/21 NSR, Normal  Echo 11/27/20 1. Left ventricular ejection fraction, by estimation, is 60 to 65%. The  left ventricle has normal function. The left ventricle has no regional  wall motion abnormalities. Left ventricular diastolic parameters are  consistent with Grade I diastolic  dysfunction (impaired relaxation).   2. Right ventricular systolic function is normal. The right ventricular  size is normal.   3. The mitral valve is normal in structure. Trivial mitral valve  regurgitation. No evidence of mitral stenosis.   4. The aortic valve is tricuspid. Aortic valve regurgitation is not  visualized. No aortic stenosis is present.      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 Restless legs syndrome  Neuromuscular disease    GI/Hepatic ,GERD  Medicated,,Hx/o hepatic steatosis IBS  Hx/o Colon polyps Hx/o anal fistula Hx/o diverticulitis   Endo/Other  diabetes, Poorly Controlled, Type 2, Oral Hypoglycemic Agents  Obesity HLD GLP-1 RA therapy- Last dose Hx/o Breast Ca S/P bilateral mastectomy + ChemoRx  Renal/GU Renal diseaseHx/o renal calculi  negative genitourinary   Musculoskeletal  (+) Arthritis , Osteoarthritis,    Abdominal   Peds  Hematology  (+) Blood dyscrasia, anemia   Anesthesia Other Findings    Reproductive/Obstetrics                             Anesthesia Physical Anesthesia Plan  ASA: 3  Anesthesia Plan: MAC   Post-op Pain Management: Minimal or no pain anticipated   Induction:   PONV Risk Score and Plan: 2 and Propofol  infusion and Ondansetron   Airway Management Planned: Natural Airway and Simple Face Mask  Additional Equipment:   Intra-op Plan:   Post-operative Plan:   Informed Consent: I have reviewed the patients History and Physical, chart, labs and discussed the procedure including the risks, benefits and alternatives for the proposed anesthesia with the patient or authorized representative who has indicated his/her understanding and acceptance.       Plan Discussed with: CRNA  Anesthesia Plan Comments:        Anesthesia Quick Evaluation

## 2023-08-30 NOTE — Op Note (Signed)
 [facility] Patient Name: [name] Procedure Date : 08/30/2023 MRN: [id number] Attending MD: Sergio Dandy , MD, [id number] Date of Birth: 1940-11-06 CSN: [id number] Age: 83 Admit Type: Outpatient Procedure:                Colonoscopy Indications:              Abnormal CT of the GI tract, Fecal incontinence,                            rectal fistula Providers:                Sergio Dandy, MD, Millicent Ally, RN, Gabino Joe, Technician Referring MD:              Medicines:                Monitored Anesthesia Care Complications:            No immediate complications. Estimated Blood Loss:     Estimated blood loss was minimal. Estimated blood                            loss was minimal. Procedure:                Pre-Anesthesia Assessment:                           - Prior to the procedure, a History and Physical                            was performed, and patient medications and                            allergies were reviewed. The patient's tolerance of                            previous anesthesia was also reviewed. The risks                            and benefits of the procedure and the sedation                            options and risks were discussed with the patient.                            All questions were answered, and informed consent                            was obtained. Prior Anticoagulants: The patient has                            taken no anticoagulant or antiplatelet agents. ASA  Grade Assessment: III - A patient with severe                            systemic disease. After reviewing the risks and                            benefits, the patient was deemed in satisfactory                            condition to undergo the procedure.                           After obtaining informed consent, the colonoscope                            was passed under direct vision.  Throughout the                            procedure, the patient's blood pressure, pulse, and                            oxygen  saturations were monitored continuously. The                            [id number]-[id number] ([id number]) Olympus peds colonoscope was                            introduced through the anus and advanced to the the                            cecum, identified by appendiceal orifice and                            ileocecal valve. The colonoscopy was performed                            without difficulty. The patient tolerated the                            procedure well. The quality of the bowel                            preparation was good. The ileocecal valve,                            appendiceal orifice, and rectum were photographed. Scope In: 8:44:34 AM Scope Out: 8:57:59 AM Scope Withdrawal Time: 0 hours 8 minutes 58 seconds  Total Procedure Duration: 0 hours 13 minutes 25 seconds  Findings:      The perianal exam was abnormal. Extensive scar tissue in the peri anal       area from prior surgeries, no visible fistula      A 7 mm polyp was found in the transverse colon. The polyp was sessile.       The polyp was removed with a  cold snare. Resection and retrieval were       complete.      A 1 mm polyp was found in the rectum. The polyp was sessile. The polyp       was removed with a cold biopsy forceps. Resection and retrieval were       complete.      A few large-mouthed, medium-mouthed and small-mouthed diverticula were       found in the sigmoid colon.      Non-bleeding internal hemorrhoids were found during retroflexion. The       hemorrhoids were small. Impression:               - Abnormal perianal exam.                           - One 7 mm polyp in the transverse colon, removed                            with a cold snare. Resected and retrieved.                           - One 1 mm polyp in the rectum, removed with a cold                            biopsy  forceps. Resected and retrieved.                           - Diverticulosis in the sigmoid colon.                           - Non-bleeding internal hemorrhoids. Recommendation:           - Resume previous diet.                           - Continue present medications.                           - Await pathology results.                           - No repeat colonoscopy due to age. Procedure Code(s):        --- Professional ---                           [phone], Colonoscopy, flexible; with removal of                            tumor(s), polyp(s), or other lesion(s) by snare                            technique                           45380, 59, Colonoscopy, flexible; with biopsy,                            single or  multiple Diagnosis Code(s):        --- Professional ---                           K64.8, Other hemorrhoids                           D12.3, Benign neoplasm of transverse colon (hepatic                            flexure or splenic flexure)                           D12.8, Benign neoplasm of rectum                           R15.9, Full incontinence of feces                           K57.30, Diverticulosis of large intestine without                            perforation or abscess without bleeding                           R93.3, Abnormal findings on diagnostic imaging of                            other parts of digestive tract CPT copyright 2022 [employer]. All rights reserved. The codes documented in this report are preliminary and upon coder review may  be revised to meet current compliance requirements. Candy Ziegler V. Wyman Meschke, MD 08/30/2023 9:34:46 AM This report has been signed electronically. Number of Addenda: 0

## 2023-08-30 NOTE — Progress Notes (Signed)
 Hypoglycemic Event  CBG: 66  Treatment: D50 25 mL (12.5 gm)  Symptoms: None  Follow-up CBG: [id number] CBG Result:96  Possible Reasons for Event: Inadequate meal intake  Comments/MD notified:Dr Harwood Lingo notified.    Yuvraj Pfeifer P Twilia Yaklin

## 2023-08-31 ENCOUNTER — Encounter (HOSPITAL_COMMUNITY): Payer: Self-pay | Admitting: Gastroenterology

## 2023-08-31 LAB — SURGICAL PATHOLOGY

## 2023-08-31 NOTE — Telephone Encounter (Signed)
 Spoke with the patient. See telephone encounter of 08/29/23.

## 2023-08-31 NOTE — Anesthesia Postprocedure Evaluation (Signed)
 Anesthesia Post Note  Patient: [name]  Procedure(s) Performed: COLONOSCOPY POLYPECTOMY, INTESTINE     Patient location during evaluation: PACU Anesthesia Type: MAC Level of consciousness: awake and alert Pain management: pain level controlled Vital Signs Assessment: post-procedure vital signs reviewed and stable Respiratory status: spontaneous breathing, nonlabored ventilation, respiratory function stable and patient connected to nasal cannula oxygen  Cardiovascular status: stable and blood pressure returned to baseline Postop Assessment: no apparent nausea or vomiting Anesthetic complications: no  No notable events documented.  Last Vitals:  Vitals:   08/30/23 0930 08/30/23 0940  BP:    Pulse: 82 82  Resp: 16 11  Temp:    SpO2: 95% 96%    Last Pain:  Vitals:   08/30/23 0920  TempSrc:   PainSc: 0-No pain                 Melvenia Stabs

## 2023-09-02 ENCOUNTER — Other Ambulatory Visit (HOSPITAL_BASED_OUTPATIENT_CLINIC_OR_DEPARTMENT_OTHER): Payer: Self-pay | Admitting: Obstetrics & Gynecology

## 2023-09-02 DIAGNOSIS — N898 Other specified noninflammatory disorders of vagina: Secondary | ICD-10-CM

## 2023-09-02 MED ORDER — FLUCONAZOLE 150 MG PO TABS: 150.0000 mg | ORAL_TABLET | Freq: Once | ORAL | 0 refills | Status: DC

## 2023-09-05 ENCOUNTER — Encounter: Payer: Self-pay | Admitting: Family Medicine

## 2023-09-05 ENCOUNTER — Other Ambulatory Visit: Payer: Self-pay | Admitting: Family Medicine

## 2023-09-12 ENCOUNTER — Other Ambulatory Visit (HOSPITAL_BASED_OUTPATIENT_CLINIC_OR_DEPARTMENT_OTHER): Payer: Self-pay | Admitting: Obstetrics & Gynecology

## 2023-09-12 ENCOUNTER — Other Ambulatory Visit: Payer: Self-pay | Admitting: Family Medicine

## 2023-09-12 DIAGNOSIS — N898 Other specified noninflammatory disorders of vagina: Secondary | ICD-10-CM

## 2023-09-13 DIAGNOSIS — L659 Nonscarring hair loss, unspecified: Secondary | ICD-10-CM | POA: Diagnosis not present

## 2023-09-13 DIAGNOSIS — K603 Anal fistula, unspecified: Secondary | ICD-10-CM | POA: Diagnosis not present

## 2023-09-14 ENCOUNTER — Ambulatory Visit: Admitting: Podiatry

## 2023-09-15 ENCOUNTER — Ambulatory Visit

## 2023-09-15 ENCOUNTER — Ambulatory Visit: Payer: Medicare PPO | Admitting: Adult Health

## 2023-09-15 ENCOUNTER — Encounter: Payer: Self-pay | Admitting: Adult Health

## 2023-09-15 VITALS — BP 118/63 | HR 86 | Temp 97.8°F | Ht 72.0 in | Wt 258.0 lb

## 2023-09-15 DIAGNOSIS — J479 Bronchiectasis, uncomplicated: Secondary | ICD-10-CM

## 2023-09-15 DIAGNOSIS — R0989 Other specified symptoms and signs involving the circulatory and respiratory systems: Secondary | ICD-10-CM | POA: Diagnosis not present

## 2023-09-15 DIAGNOSIS — J310 Chronic rhinitis: Secondary | ICD-10-CM

## 2023-09-15 MED ORDER — IPRATROPIUM BROMIDE 0.03 % NA SOLN: 2.0000 | Freq: Two times a day (BID) | NASAL | 5 refills | Status: AC | PRN

## 2023-09-15 NOTE — Progress Notes (Signed)
 @Patient  ID: [name], female    DOB: 1940/11/12, 83 y.o.   MRN: [id number]  Chief Complaint  Patient presents with   Follow-up    Referring provider: Almira Jaeger, MD  HPI: 83 yo female never smoker followed for bronchiectasis and tracheomalacia History of chronic respiratory failure on oxygen  discontinued in 2023, chronic diastolic heart failure   TEST/EVENTS :  Pulmonary function testing 12/02/2015 , shows mixed restriction and obstruction by spirometry without a bronchodilator response, normal lung volumes and a decreased diffusion capacity that corrects to the normal range when adjusted for alveolar volume.   High-resolution CT chest December 04, 2015 showed negative for ILD.  Severe tracheomalacia, mild bronchiectasis and multiple tiny pulmonary nodules 4 mm or less   PFTs June 01, 2021 shows a moderate restriction with FEV1 at 51%, ratio 77, FVC 50%, DLCO 76%   CT chest June 10, 2021 shows left small effusion, mild bronchiectasis, small stable pulmonary nodules   Coronary CT chest October 08, 2021 showed resolution of small left pleural effusion.  No acute pulmonary process noted in the visualized lung fields  09/15/2023 Follow up : Bronchiectasis  Patient returns for a 1 year follow-up.  Patient has bronchiectasis.  Says overall breathing has been doing okay.  She did have an acute bronchitis in March that required antibiotics.  It took her few weeks to get over but she is back to baseline.  Does complain of chronic sinus congestion, stuffy nose, fullness in her ears and constant drippy nose.  She remains on Singulair , Zyrtec and Nasonex .  She denies any fever or discolored mucus.  Remains on Symbicort  twice daily.  Has upcoming surgery planned for anal fistula.   Allergies  Allergen Reactions   Nitrofurantoin Other (See Comments)    Severe headache HEADACHE   Levaquin [Levofloxacin In D5w] Other (See Comments)    Pain in tendons    Brimonidine Other (See  Comments)    Made eyes and surrounding areas RED/ was an eye drop   Cephalexin  Diarrhea and Other (See Comments)    Patient can't remember reaction (per chart at [facility] states diarrhea) (tolerates Augmentin  fine)   Erythromycin Ethylsuccinate Hives and Diarrhea   Oxycodone  Itching    Immunization History  Administered Date(s) Administered   Fluad Quad(high Dose 65+) 02/22/2019, 02/20/2020, 02/18/2021, 01/27/2022   H1N1 06/05/2008   Influenza Split 02/07/2012   Influenza Whole 03/10/2000, 01/24/2008, 03/10/2009, 01/20/2010   Influenza, High Dose Seasonal PF 02/12/2013, 01/31/2017, 01/19/2018   Influenza,inj,Quad PF,6+ Mos 01/17/2014, 03/27/2015   Influenza,inj,quad, With Preservative 02/07/2017   Influenza-Unspecified 02/09/2016, 02/23/2023   PFIZER(Purple Top)SARS-COV-2 Vaccination 06/03/2019, 06/25/2019, 04/24/2020   PNEUMOCOCCAL CONJUGATE-20 04/27/2022   Pneumococcal Conjugate-13 01/17/2014   Pneumococcal Polysaccharide-23 03/10/2000, 05/02/2007, 01/02/2012   Respiratory Syncytial Virus Vaccine,Recomb Aduvanted(Arexvy) 03/08/2022   Td 05/10/1992, 12/25/2007   Zoster Recombinant(Shingrix) 02/24/2018, 04/26/2018    Past Medical History:  Diagnosis Date   Allergy    Anemia    Anxiety    Arthritis    right knee;injections every 6months    Asthma    use daily   Breast cancer (HCC) 1994/1995   HX BREAST CANCER/ right brreast and left breast in 1995   Bronchiectasis (HCC)    COMPRESSION FRACTURE, LUMBAR VERTEBRAE 08/21/2008   Qualifier: Diagnosis of  By: Larrie Po MD, John E    Depression    Diabetes mellitus    takes Amaryl  and Januvia  daily   Diverticulitis    Dyspnea    with activity  Early cataracts, bilateral    Eczema    Endometriosis    Fatty liver    Gastritis    Glaucoma    Hammer toe    History of kidney stones    Hyperlipidemia associated with type 2 diabetes mellitus (HCC) 07/26/2007   Diet/exercise control.     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 8 g 6   atenolol  (TENORMIN ) 25 MG tablet Take 1 tablet by mouth once daily 90 tablet 0   Bacillus Coagulans-Inulin (ALIGN PREBIOTIC-PROBIOTIC PO) Take 1 capsule by mouth daily.      BD PEN NEEDLE NANO 2ND GEN 32G X 4 MM MISC USE AS DIRECTED DAILY 100 each 0   cetirizine (ZYRTEC) 10 MG tablet Take 10 mg by mouth every morning.      Cholecalciferol  (VITAMIN D3) 125 MCG (5000 UT) TABS Take 5,000 Units by mouth daily.     Coenzyme Q10 (COQ10) 200 MG CAPS Take 200 mg by mouth daily.     desvenlafaxine  (PRISTIQ ) 50 MG 24 hr tablet Take 1 tablet (50 mg total) by mouth daily. 90 tablet 3   dorzolamide -timolol  (COSOPT ) 22.3-6.8 MG/ML ophthalmic solution Place 1 drop into both eyes 2 (two) times daily.      estradiol  (ESTRING ) 7.5 MCG/24HR vaginal ring Place 1 each vaginally every 3 (three) months. 1 each 3   FIBER PO Take 2 tablets by mouth  every evening.     fluconazole  (DIFLUCAN ) 150 MG tablet TAKE ONE TABLET BY MOUTH AS A ONE-TIME DOSE REPEAT  IN  48  HOURS  IF  SYMPTOMS  ARE  NOT  COMPLETELY  RESOLVED 2 tablet 0   fluticasone  (FLONASE ) 50 MCG/ACT nasal spray Use 1 spray(s) in each nostril once daily 16 g 0   fosfomycin (MONUROL) 3 g PACK Take 3 g by mouth once. Once per month on external instructions- she reports was told once every 10 days per urology     gabapentin  (NEURONTIN ) 100 MG capsule TAKE 1 CAPSULE BY MOUTH THREE TIMES DAILY AS NEEDED FOR  NEUROPATIC  PAIN. (Patient taking differently: TAKE 1 CAPSULE BY MOUTH THREE TIMES DAILY AS NEEDED FOR  NEUROPATIC  PAIN.)  270 capsule 0   glimepiride  (AMARYL ) 4 MG tablet TAKE 2 TABLETS BY MOUTH ONCE DAILY WITH BREAKFAST 180 tablet 0   glucose blood (ACCU-CHEK GUIDE) test strip USE 1 STRIP ONCE DAILY TO  CHECK  GLUCOSE. Dx: E11.9 100 each 3   Lancets Misc. (ACCU-CHEK FASTCLIX LANCET) KIT Use to test blood sugars daily. Dx: E11.9 1 kit 5   latanoprost  (XALATAN ) 0.005 % ophthalmic solution Place 1 drop into the right eye at bedtime.      loperamide  (IMODIUM ) 2 MG capsule Take 2 capsules (4 mg total) by mouth 3 (three) times daily as needed for diarrhea or loose stools. 12 capsule 0   lovastatin  (MEVACOR ) 10 MG tablet TAKE 1 TABLET BY MOUTH ONCE DAILY 90 tablet 0   methenamine  (HIPREX ) 1 g tablet Take 1 tablet (1 g total) by mouth 2 (two) times daily with a meal. 60 tablet 11   montelukast  (SINGULAIR ) 10 MG tablet TAKE 1 TABLET BY MOUTH AT BEDTIME . APPOINTMENT REQUIRED FOR FUTURE REFILLS 30 tablet 11   Multiple Vitamin (MULTIVITAMIN WITH MINERALS) TABS tablet Take 1 tablet by mouth daily. 3 Fruit Capsules 3 Vegetable Capsules - Balance of Nature 30 tablet 0   MYRBETRIQ 50 MG TB24 tablet Take 1 tablet by mouth once daily 30 tablet 0   ondansetron  (ZOFRAN -ODT) 4 MG disintegrating tablet Take 1 tablet (4 mg total) by mouth every 6 (six) hours as needed for nausea or vomiting. 10 tablet 0    OZEMPIC , 1 MG/DOSE, 4 MG/3ML SOPN INJECT 1MG  SUBCUTANEOUSLY ONCE A WEEK 3 mL 0   pramipexole  (MIRAPEX ) 0.5 MG tablet TAKE 1 TABLET BY MOUTH AT BEDTIME 90 tablet 0   Selenium 200 MCG CAPS Take 200 mcg by mouth at bedtime.      SYMBICORT  80-4.5 MCG/ACT inhaler Inhale 2 puffs into the lungs 2 (two) times daily. 10.2 g 11   tamsulosin  (FLOMAX ) 0.4 MG CAPS capsule Take 1 capsule by mouth at bedtime 30 capsule 0   temazepam  (RESTORIL ) 15 MG capsule Take 1 capsule (15 mg total) by mouth at bedtime. 30 capsule 5   terconazole  (TERAZOL 7 ) 0.4 % vaginal cream Place 1 applicator vaginally at bedtime. 45 g 0   TRESIBA  FLEXTOUCH 200 UNIT/ML FlexTouch Pen INJECT 80 UNITS SUBCUTANEOUSLY IN THE MORNING 9 mL 0   valsartan  (DIOVAN ) 80 MG tablet Take 1 tablet by mouth once daily 90 tablet 0   vitamin B-12 (CYANOCOBALAMIN ) 1000 MCG tablet Take 1,000 mcg by mouth daily.     No facility-administered medications prior to visit.     Review of Systems:   Constitutional:   No  weight loss, night sweats,  Fevers, chills, +fatigue, or  lassitude.  HEENT:   No headaches,  Difficulty swallowing,  Tooth/dental problems, or  Sore throat,                No sneezing, itching, ear ache, +nasal congestion, post nasal drip,   CV:  No chest pain,  Orthopnea, PND, swelling in lower extremities, anasarca, dizziness, palpitations, syncope.   GI  No heartburn, indigestion, abdominal pain, nausea, vomiting, diarrhea, change in bowel habits, loss of appetite, bloody stools.   Resp: .  No chest wall deformity  Skin: no rash or lesions.  GU: no dysuria, change in color of urine, no urgency or frequency.  No flank pain, no hematuria   MS:  No joint pain or swelling.  No decreased range of motion.  No back pain.    Physical Exam  BP 118/63 (BP Location: Right Arm, Patient Position: Sitting, Cuff Size: Large)   Pulse 86   Temp 97.8 F (36.6 C) (Temporal)   Ht 6' (1.829 m)   Wt 258 lb (117 kg)   LMP 05/10/1992 Comment:  partial  SpO2 90%   BMI 34.99 kg/m   GEN: A/Ox3; pleasant , NAD, well nourished, wheelchair   HEENT:  /AT,   NOSE-clear, THROAT-clear, no lesions, no postnasal drip or exudate noted.   NECK:  Supple w/ fair ROM; no JVD; normal carotid impulses w/o bruits; no thyromegaly or nodules palpated; no lymphadenopathy.    RESP  Clear  P & A; w/o, wheezes/ rales/ or rhonchi. no accessory muscle use, no dullness to percussion  CARD:  RRR, no m/r/g, no peripheral edema, pulses intact, no cyanosis or clubbing.  GI:   Soft & nt; nml bowel sounds; no organomegaly or masses detected.   Musco: Warm bil, no deformities or joint swelling noted.   Neuro: alert, no focal deficits noted.    Skin: Warm, no lesions or rashes    Lab Results:      Imaging: No results found.  Administration History     None          Latest Ref Rng & Units 06/01/2021    2:52 PM 12/02/2015   11:51 AM  PFT Results  FVC-Pre L 1.70  2.52   FVC-Predicted Pre % 48  67   FVC-Post L 1.79  2.63   FVC-Predicted Post % 50  70   Pre FEV1/FVC % % 78  77   Post FEV1/FCV % % 77  77   FEV1-Pre L 1.32  1.94   FEV1-Predicted Pre % 49  68   FEV1-Post L 1.37  2.02   DLCO uncorrected ml/min/mmHg 18.21  25.72   DLCO UNC% % 76  74   DLCO corrected ml/min/mmHg 18.21  25.96   DLCO COR %Predicted % 76  75   DLVA Predicted % 125  94   TLC L 4.32  5.02   TLC % Predicted % 69  81   RV % Predicted % 86  91     No results found for: "NITRICOXIDE"      Assessment & Plan:   Bronchiectasis (HCC) Appears stable.  Recent bronchitis few months ago now resolved.  Plan  Continue on Symbicort  twice daily.  Control for triggers.  Continue with mucociliary clearance chest x-ray today.  Chronic rhinitis Increase in symptoms-add ipratropium nasal spray.  Begin saline nasal rinses twice daily.  Change Zyrtec to Allegra.  Continue on Singulair  and Nasonex   Plan  Patient Instructions  Chest xray today  Continue on  Symbicort  2 puffs Twice daily, rinse after use.  Albuterol  inhaler As needed   Change Zyrtec to Allegra daily  Continue on Singulair  and Nasonex  daily.  Try Ipratropium nasal 2 puffs Twice daily  As needed  drippy nose.  Activity as tolerated.  Follow up with Dr. Baldwin Levee 1 year and As needed   Please contact office for sooner follow up if symptoms do not improve or worsen or seek emergency care       Roena Clark, NP 09/15/2023

## 2023-09-15 NOTE — Assessment & Plan Note (Signed)
 Appears stable.  Recent bronchitis few months ago now resolved.  Plan  Continue on Symbicort  twice daily.  Control for triggers.  Continue with mucociliary clearance chest x-ray today.

## 2023-09-15 NOTE — Patient Instructions (Addendum)
 Chest xray today  Continue on Symbicort  2 puffs Twice daily, rinse after use.  Albuterol  inhaler As needed   Change Zyrtec to Allegra daily  Continue on Singulair  and Nasonex  daily.  Try Ipratropium nasal 2 puffs Twice daily  As needed  drippy nose.  Activity as tolerated.  Follow up with Dr. Baldwin Levee 1 year and As needed   Please contact office for sooner follow up if symptoms do not improve or worsen or seek emergency care

## 2023-09-15 NOTE — Assessment & Plan Note (Signed)
 Increase in symptoms-add ipratropium nasal spray.  Begin saline nasal rinses twice daily.  Change Zyrtec to Allegra.  Continue on Singulair  and Nasonex   Plan  Patient Instructions  Chest xray today  Continue on Symbicort  2 puffs Twice daily, rinse after use.  Albuterol  inhaler As needed   Change Zyrtec to Allegra daily  Continue on Singulair  and Nasonex  daily.  Try Ipratropium nasal 2 puffs Twice daily  As needed  drippy nose.  Activity as tolerated.  Follow up with Dr. Baldwin Levee 1 year and As needed   Please contact office for sooner follow up if symptoms do not improve or worsen or seek emergency care

## 2023-09-16 DIAGNOSIS — N3941 Urge incontinence: Secondary | ICD-10-CM | POA: Diagnosis not present

## 2023-09-16 DIAGNOSIS — N200 Calculus of kidney: Secondary | ICD-10-CM | POA: Diagnosis not present

## 2023-09-16 DIAGNOSIS — N3020 Other chronic cystitis without hematuria: Secondary | ICD-10-CM | POA: Diagnosis not present

## 2023-09-19 ENCOUNTER — Other Ambulatory Visit: Payer: Self-pay | Admitting: Family Medicine

## 2023-09-24 ENCOUNTER — Other Ambulatory Visit: Payer: Self-pay | Admitting: Emergency Medicine

## 2023-09-24 ENCOUNTER — Other Ambulatory Visit: Payer: Self-pay | Admitting: Family Medicine

## 2023-09-24 DIAGNOSIS — J301 Allergic rhinitis due to pollen: Secondary | ICD-10-CM

## 2023-09-29 ENCOUNTER — Encounter: Payer: Self-pay | Admitting: Family Medicine

## 2023-10-04 ENCOUNTER — Other Ambulatory Visit: Payer: Self-pay

## 2023-10-04 ENCOUNTER — Telehealth: Payer: Self-pay

## 2023-10-04 DIAGNOSIS — E1165 Type 2 diabetes mellitus with hyperglycemia: Secondary | ICD-10-CM

## 2023-10-04 MED ORDER — ACCU-CHEK GUIDE W/DEVICE KIT: PACK | 3 refills | Status: AC

## 2023-10-04 NOTE — Telephone Encounter (Signed)
Meter sent to requested pharmacy.

## 2023-10-07 ENCOUNTER — Encounter: Payer: Self-pay | Admitting: Family Medicine

## 2023-10-07 DIAGNOSIS — I152 Hypertension secondary to endocrine disorders: Secondary | ICD-10-CM | POA: Diagnosis not present

## 2023-10-07 DIAGNOSIS — E119 Type 2 diabetes mellitus without complications: Secondary | ICD-10-CM | POA: Diagnosis not present

## 2023-10-07 DIAGNOSIS — E7849 Other hyperlipidemia: Secondary | ICD-10-CM | POA: Diagnosis not present

## 2023-10-08 ENCOUNTER — Other Ambulatory Visit: Payer: Self-pay | Admitting: Family Medicine

## 2023-10-08 ENCOUNTER — Other Ambulatory Visit: Payer: Self-pay | Admitting: Adult Health

## 2023-10-08 DIAGNOSIS — R0602 Shortness of breath: Secondary | ICD-10-CM

## 2023-10-10 ENCOUNTER — Encounter: Payer: Self-pay | Admitting: Family Medicine

## 2023-10-11 ENCOUNTER — Ambulatory Visit: Admitting: Podiatry

## 2023-10-11 ENCOUNTER — Encounter: Payer: Self-pay | Admitting: Podiatry

## 2023-10-11 DIAGNOSIS — B351 Tinea unguium: Secondary | ICD-10-CM | POA: Diagnosis not present

## 2023-10-11 DIAGNOSIS — M79671 Pain in right foot: Secondary | ICD-10-CM | POA: Diagnosis not present

## 2023-10-11 DIAGNOSIS — I739 Peripheral vascular disease, unspecified: Secondary | ICD-10-CM

## 2023-10-11 DIAGNOSIS — M79672 Pain in left foot: Secondary | ICD-10-CM | POA: Diagnosis not present

## 2023-10-11 DIAGNOSIS — I10 Essential (primary) hypertension: Secondary | ICD-10-CM | POA: Diagnosis not present

## 2023-10-11 NOTE — Progress Notes (Signed)
 Patient presents for evaluation and treatment of tenderness and some redness around nails feet.  Tenderness around toes with walking and wearing shoes.  Physical exam:  General appearance: Alert, pleasant, and in no acute distress.  Vascular: Pedal pulses: DP 2/4, PT 0/4.  Moderate edema lower legs bilaterally  Neurological:  Complaint of burning in feet especially the fourth toe on the left  Dermatologic:  Nails thickened, disfigured, discolored 1-5 BL with subungual debris.  Redness and hypertrophic nail folds along nail folds bilaterally but no signs of drainage or infection.  Musculoskeletal:  Hammertoe deformities 2 through 5 bilaterally with hallux abductovalgus deformities bilaterally.   Diagnosis: 1. Painful onychomycotic nails 1 through 5 bilaterally. 2. Pain toes 1 through 5 bilaterally. 3.  PVD  Plan: Debrided onychomycotic nails 1 through 5 bilaterally.  Return 3 months

## 2023-10-13 ENCOUNTER — Ambulatory Visit: Payer: Medicare PPO | Admitting: Family Medicine

## 2023-10-14 ENCOUNTER — Ambulatory Visit (HOSPITAL_BASED_OUTPATIENT_CLINIC_OR_DEPARTMENT_OTHER): Admitting: Obstetrics & Gynecology

## 2023-10-14 ENCOUNTER — Encounter (HOSPITAL_BASED_OUTPATIENT_CLINIC_OR_DEPARTMENT_OTHER): Payer: Self-pay | Admitting: Obstetrics & Gynecology

## 2023-10-14 VITALS — BP 132/60 | HR 80 | Ht 72.0 in | Wt 258.0 lb

## 2023-10-14 DIAGNOSIS — K604 Rectal fistula, unspecified: Secondary | ICD-10-CM | POA: Diagnosis not present

## 2023-10-14 DIAGNOSIS — N390 Urinary tract infection, site not specified: Secondary | ICD-10-CM | POA: Diagnosis not present

## 2023-10-14 DIAGNOSIS — N952 Postmenopausal atrophic vaginitis: Secondary | ICD-10-CM | POA: Diagnosis not present

## 2023-10-17 ENCOUNTER — Ambulatory Visit (HOSPITAL_BASED_OUTPATIENT_CLINIC_OR_DEPARTMENT_OTHER): Payer: Medicare PPO | Admitting: Obstetrics & Gynecology

## 2023-10-17 DIAGNOSIS — K219 Gastro-esophageal reflux disease without esophagitis: Secondary | ICD-10-CM | POA: Diagnosis not present

## 2023-10-17 DIAGNOSIS — I10 Essential (primary) hypertension: Secondary | ICD-10-CM | POA: Diagnosis not present

## 2023-10-17 DIAGNOSIS — K603 Anal fistula, unspecified: Secondary | ICD-10-CM | POA: Diagnosis not present

## 2023-10-17 DIAGNOSIS — K760 Fatty (change of) liver, not elsewhere classified: Secondary | ICD-10-CM | POA: Diagnosis not present

## 2023-10-17 DIAGNOSIS — M1990 Unspecified osteoarthritis, unspecified site: Secondary | ICD-10-CM | POA: Diagnosis not present

## 2023-10-17 NOTE — Progress Notes (Signed)
 GYNECOLOGY  VISIT  CC:   Estring  removal  HPI: 83 y.o. G0P0 Married White or Caucasian female here for Estring  removal.  Pt has surgery scheduled next week for the beginning of a rectocutaneous fistula repair.  She continues to have recurrent UTIs but this is likely due to the present of stool on her vulva almost all of the time.  Denies vaginal bleeding.     Past Medical History:  Diagnosis Date   Allergy    Anemia    Anxiety    Arthritis    right knee;injections every 6months    Asthma    use daily   Breast cancer (HCC) 1994/1995   HX BREAST CANCER/ right brreast and left breast in 1995   Bronchiectasis (HCC)    COMPRESSION FRACTURE, LUMBAR VERTEBRAE 08/21/2008   Qualifier: Diagnosis of  By: Larrie Po MD, Wilmon Hashimoto    Depression    Diabetes mellitus    takes Amaryl  and Januvia  daily   Diverticulitis    Dyspnea    with activity   Early cataracts, bilateral    Eczema    Endometriosis    Fatty liver    Gastritis    Glaucoma    Hammer toe    History of kidney stones    Hyperlipidemia associated with type 2 diabetes mellitus (HCC) 07/26/2007   Diet/exercise control.     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 8 g 6   atenolol  (TENORMIN ) 25 MG tablet Take 1 tablet by mouth once daily 90 tablet 0   Bacillus Coagulans-Inulin (ALIGN PREBIOTIC-PROBIOTIC PO) Take 1 capsule by mouth daily.      BD PEN NEEDLE NANO 2ND GEN 32G X 4 MM MISC USE AS DIRECTED DAILY 100 each 0   Blood Glucose Monitoring Suppl (ACCU-CHEK GUIDE) w/Device KIT Use to test blood sugars daily. Dx: E11.9 1 kit 3   cetirizine (ZYRTEC) 10 MG tablet Take 10 mg by mouth every morning.      Cholecalciferol  (VITAMIN D3) 125 MCG (5000 UT) TABS Take 5,000 Units by mouth daily.     Coenzyme Q10 (COQ10) 200 MG CAPS Take 200 mg by mouth daily.     desvenlafaxine  (PRISTIQ ) 50 MG 24 hr tablet Take 1 tablet (50 mg total) by mouth daily. 90 tablet  3   dorzolamide -timolol  (COSOPT ) 22.3-6.8 MG/ML ophthalmic solution Place 1 drop into both eyes 2 (two) times daily.      estradiol  (ESTRING ) 7.5 MCG/24HR vaginal ring Place 1 each vaginally every 3 (three) months. 1 each 3   FIBER PO Take 2 tablets by mouth every evening.     fluconazole  (DIFLUCAN ) 150 MG tablet TAKE ONE TABLET BY MOUTH AS A ONE-TIME DOSE REPEAT  IN  48  HOURS  IF  SYMPTOMS  ARE  NOT  COMPLETELY  RESOLVED 2 tablet 0   fluticasone  (FLONASE ) 50 MCG/ACT nasal spray Use 1 spray(s) in each nostril once daily 16 g 0   fosfomycin (MONUROL) 3 g PACK Take 3 g by mouth once. Once per month on external instructions- she reports was told once every 10 days per urology     gabapentin  (NEURONTIN ) 100 MG capsule TAKE 1 CAPSULE BY MOUTH THREE TIMES DAILY AS NEEDED FOR  NEUROPATIC  PAIN. (Patient taking differently: TAKE 1 CAPSULE BY MOUTH THREE TIMES DAILY AS NEEDED FOR  NEUROPATIC  PAIN.) 270 capsule 0   glimepiride  (AMARYL ) 4 MG tablet TAKE 2 TABLETS BY MOUTH ONCE DAILY WITH BREAKFAST 180 tablet 0   glucose blood  (ACCU-CHEK GUIDE) test strip USE 1 STRIP ONCE DAILY TO  CHECK  GLUCOSE. Dx: E11.9 100 each 3   ipratropium (ATROVENT ) 0.03 % nasal spray Place 2 sprays into both nostrils 2 (two) times daily as needed for rhinitis. 30 mL 5   Lancets Misc. (ACCU-CHEK FASTCLIX LANCET) KIT Use to test blood sugars daily. Dx: E11.9 1 kit 5   latanoprost  (XALATAN ) 0.005 % ophthalmic solution Place 1 drop into the right eye at bedtime.      loperamide  (IMODIUM ) 2 MG capsule Take 2 capsules (4 mg total) by mouth 3 (three) times daily as needed for diarrhea or loose stools. 12 capsule 0   lovastatin  (MEVACOR ) 10 MG tablet Take 1 tablet by mouth once daily 90 tablet 0   methenamine  (HIPREX ) 1 g tablet Take 1 tablet (1 g total) by mouth 2 (two) times daily with a meal. 60 tablet 11   montelukast  (SINGULAIR ) 10 MG tablet TAKE 1 TABLET BY MOUTH AT BEDTIME . APPOINTMENT REQUIRED FOR FUTURE REFILLS 30 tablet 11   Multiple Vitamin (MULTIVITAMIN WITH MINERALS) TABS tablet Take 1 tablet by mouth daily. 3 Fruit Capsules 3 Vegetable Capsules - Balance of Nature 30 tablet 0   MYRBETRIQ 50 MG TB24 tablet Take 1 tablet by mouth once daily 30 tablet 0   ondansetron  (ZOFRAN -ODT) 4 MG disintegrating tablet Take 1 tablet (4 mg total) by mouth every 6 (six) hours as needed for nausea or vomiting. 10 tablet 0   OZEMPIC , 1 MG/DOSE, 4 MG/3ML SOPN INJECT 1MG  SUBCUTANEOUSLY ONCE A WEEK 3 mL 0   pramipexole  (MIRAPEX ) 0.5 MG tablet TAKE 1 TABLET BY MOUTH AT BEDTIME 90 tablet 0   Selenium 200 MCG CAPS Take 200 mcg by mouth at bedtime.      SYMBICORT  80-4.5 MCG/ACT inhaler Inhale 2 puffs by mouth twice daily 11 g 0   tamsulosin  (FLOMAX ) 0.4 MG CAPS capsule Take 1 capsule by mouth at bedtime 30 capsule 0   temazepam  (RESTORIL ) 15 MG capsule Take 1 capsule by mouth at bedtime 30 capsule 0   terconazole  (TERAZOL 7 ) 0.4 % vaginal cream Place 1 applicator vaginally at bedtime. 45 g 0   TRESIBA  FLEXTOUCH 200 UNIT/ML FlexTouch Pen INJECT 80 UNITS  SUBCUTANEOUSLY IN THE MORNING 9 mL 0  valsartan  (DIOVAN ) 80 MG tablet Take 1 tablet by mouth once daily 90 tablet 0   vitamin B-12 (CYANOCOBALAMIN ) 1000 MCG tablet Take 1,000 mcg by mouth daily.     No current facility-administered medications on file prior to visit.    ALLERGIES: Nitrofurantoin, Levaquin [levofloxacin in d5w], Brimonidine, Cephalexin , Erythromycin ethylsuccinate, and Oxycodone   SH:  married, non smoker  Review of Systems  Constitutional: Negative.   Genitourinary: Negative.     PHYSICAL EXAMINATION:    BP 132/60 (BP Location: Right Arm, Patient Position: Sitting)   Pulse 80   Ht 6' (1.829 m)   Wt 258 lb (117 kg)   LMP 05/10/1992 Comment: partial  BMI 34.99 kg/m     General appearance: alert, cooperative and appears stated age  Lymph:  no inguinal LAD noted Pelvic: External genitalia:  NAEFG              Urethra:  normal appearing urethra with no masses, tenderness or lesions              Bartholins and Skenes: normal                 Vagina: no lesions, estring  removed without difficulty              Anus:  yellowish stool present above and to left of anal spincter  Chaperone was present for exam.  Assessment/Plan: 1. Vaginal atrophy (Primary) - estring  removed but not replaced.  She will call after surgery is completed for replacement  2. Recurrent UTI - on fosfomycin once every 10 days  3. Rectal fistula - has beginning of surgery procedure scheduled next week

## 2023-10-18 ENCOUNTER — Other Ambulatory Visit: Payer: Self-pay | Admitting: Family Medicine

## 2023-10-21 ENCOUNTER — Other Ambulatory Visit: Payer: Self-pay | Admitting: Family Medicine

## 2023-10-22 ENCOUNTER — Other Ambulatory Visit: Payer: Self-pay | Admitting: Family Medicine

## 2023-10-25 ENCOUNTER — Other Ambulatory Visit: Payer: Self-pay

## 2023-10-25 MED ORDER — GLIMEPIRIDE 4 MG PO TABS: 4.0000 mg | ORAL_TABLET | Freq: Every day | ORAL | 3 refills | Status: AC

## 2023-10-26 ENCOUNTER — Encounter (HOSPITAL_BASED_OUTPATIENT_CLINIC_OR_DEPARTMENT_OTHER): Payer: Self-pay | Admitting: Obstetrics & Gynecology

## 2023-10-26 ENCOUNTER — Other Ambulatory Visit: Payer: Self-pay

## 2023-10-26 ENCOUNTER — Ambulatory Visit: Payer: Self-pay | Admitting: Gastroenterology

## 2023-10-26 ENCOUNTER — Ambulatory Visit (HOSPITAL_BASED_OUTPATIENT_CLINIC_OR_DEPARTMENT_OTHER): Admitting: Obstetrics & Gynecology

## 2023-10-26 VITALS — BP 132/61 | HR 89 | Ht 72.0 in | Wt 258.0 lb

## 2023-10-26 DIAGNOSIS — N952 Postmenopausal atrophic vaginitis: Secondary | ICD-10-CM | POA: Diagnosis not present

## 2023-10-26 MED ORDER — TEMAZEPAM 15 MG PO CAPS: 15.0000 mg | ORAL_CAPSULE | Freq: Every day | ORAL | 5 refills | Status: DC

## 2023-10-26 NOTE — Progress Notes (Signed)
 Are the dates right?  It looks like we just filled this last month with 5 refills from what you listed

## 2023-10-26 NOTE — Progress Notes (Signed)
 Sorry that should be 0rfs.

## 2023-10-28 NOTE — Progress Notes (Signed)
 GYNECOLOGY  VISIT  CC:   estring  placement  HPI: 83 y.o. G0P0 Married White or Caucasian female here for Estring  placement.  Estring  was removed prior to recent surgery.  On June 9 she had an exam under anesthesia with irrigation of perianal fistula and seton placement.  She has done well since the procedure.  She reports the presence of stool is much decreased.  She is really not having any pain.  She is tolerating the seton very well.  She does have some questions about this and I answered these today.   Past Medical History:  Diagnosis Date   Allergy    Anemia    Anxiety    Arthritis    right knee;injections every 6months    Asthma    use daily   Breast cancer (HCC) 1994/1995   HX BREAST CANCER/ right brreast and left breast in 1995   Bronchiectasis (HCC)    COMPRESSION FRACTURE, LUMBAR VERTEBRAE 08/21/2008   Qualifier: Diagnosis of  By: Larrie Po MD, Wilmon Hashimoto    Depression    Diabetes mellitus    takes Amaryl  and Januvia  daily   Diverticulitis    Dyspnea    with activity   Early cataracts, bilateral    Eczema    Endometriosis    Fatty liver    Gastritis    Glaucoma    Hammer toe    History of kidney stones    Hyperlipidemia associated with type 2 diabetes mellitus (HCC) 07/26/2007   Diet/exercise control.     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 8 g 6   atenolol  (TENORMIN ) 25 MG tablet Take 1 tablet by mouth once daily 90 tablet 0   Bacillus Coagulans-Inulin (ALIGN PREBIOTIC-PROBIOTIC PO) Take 1 capsule by mouth daily.      BD PEN NEEDLE NANO 2ND GEN 32G X 4 MM MISC USE AS DIRECTED DAILY 100 each 0   Blood Glucose Monitoring Suppl (ACCU-CHEK GUIDE) w/Device KIT Use to test blood sugars daily. Dx: E11.9 1 kit 3   cetirizine (ZYRTEC) 10 MG tablet Take 10 mg by mouth every morning.      Cholecalciferol  (VITAMIN D3) 125 MCG (5000 UT) TABS Take 5,000 Units by mouth daily.     Coenzyme Q10 (COQ10) 200 MG CAPS Take  200 mg by mouth daily.     desvenlafaxine  (PRISTIQ ) 50 MG 24 hr tablet Take 1 tablet (50 mg total) by mouth daily. 90 tablet 3   dorzolamide -timolol  (COSOPT ) 22.3-6.8 MG/ML ophthalmic solution Place 1 drop into both eyes 2 (two) times daily.      estradiol  (ESTRING ) 7.5 MCG/24HR vaginal ring Place 1 each vaginally every 3 (three) months. 1 each 3   FIBER PO Take 2 tablets by mouth every evening.     fluconazole  (DIFLUCAN ) 150 MG tablet TAKE ONE TABLET BY MOUTH AS A ONE-TIME DOSE REPEAT  IN  48  HOURS  IF  SYMPTOMS  ARE  NOT  COMPLETELY  RESOLVED 2 tablet 0   fluticasone  (FLONASE ) 50 MCG/ACT nasal spray Use 1 spray(s) in each nostril once daily 16 g 0   fosfomycin (MONUROL) 3 g PACK Take 3 g by mouth once. Once per month on external instructions- she reports was told once every 10 days per urology     gabapentin  (NEURONTIN ) 100 MG capsule TAKE 1 CAPSULE BY MOUTH THREE TIMES DAILY AS NEEDED FOR  NEUROPATIC  PAIN. (Patient taking differently: TAKE 1 CAPSULE BY MOUTH THREE TIMES DAILY AS NEEDED FOR   NEUROPATIC  PAIN.) 270 capsule 0   glimepiride  (AMARYL ) 4 MG tablet Take 1 tablet (4 mg total) by mouth daily with breakfast. 180 tablet 3   glucose blood (ACCU-CHEK GUIDE) test strip USE 1 STRIP ONCE DAILY TO  CHECK  GLUCOSE. Dx: E11.9 100 each 3   ipratropium (ATROVENT ) 0.03 % nasal spray Place 2 sprays into both nostrils 2 (two) times daily as needed for rhinitis. 30 mL 5   Lancets Misc. (ACCU-CHEK FASTCLIX LANCET) KIT Use to test blood sugars daily. Dx: E11.9 1 kit 5   latanoprost  (XALATAN ) 0.005 % ophthalmic solution Place 1 drop into the right eye at bedtime.      loperamide  (IMODIUM ) 2 MG capsule Take 2 capsules (4 mg total) by mouth 3 (three) times daily as needed for diarrhea or loose stools. 12 capsule 0   lovastatin  (MEVACOR ) 10 MG tablet Take 1 tablet by mouth once daily 90 tablet 0   methenamine  (HIPREX ) 1 g tablet Take 1 tablet (1 g total) by mouth 2 (two) times daily with a meal. 60 tablet 11   montelukast  (SINGULAIR ) 10 MG tablet TAKE 1 TABLET BY MOUTH AT BEDTIME . APPOINTMENT REQUIRED FOR FUTURE REFILLS 30 tablet 11   Multiple Vitamin (MULTIVITAMIN WITH MINERALS) TABS tablet Take 1 tablet by mouth daily. 3 Fruit Capsules 3 Vegetable Capsules - Balance of Nature 30 tablet 0   MYRBETRIQ 50 MG TB24 tablet Take 1 tablet by mouth once daily 30 tablet 0   ondansetron  (ZOFRAN -ODT) 4 MG disintegrating tablet Take 1 tablet (4 mg total) by mouth every 6 (six) hours as needed for nausea or vomiting. 10 tablet 0   OZEMPIC , 1 MG/DOSE, 4 MG/3ML SOPN INJECT 1MG  SUBCUTANEOUSLY ONCE A WEEK 3 mL 0   pramipexole  (MIRAPEX ) 0.5 MG tablet TAKE 1 TABLET BY MOUTH AT BEDTIME 90 tablet 0   Selenium 200 MCG CAPS Take 200 mcg by mouth at bedtime.      SYMBICORT  80-4.5 MCG/ACT inhaler Inhale 2 puffs by mouth twice daily 11 g 0   tamsulosin  (FLOMAX ) 0.4 MG CAPS capsule Take 1 capsule by mouth at bedtime 30 capsule 0   terconazole  (TERAZOL 7 ) 0.4 % vaginal cream Place 1 applicator vaginally at bedtime. 45 g 0    TRESIBA  FLEXTOUCH 200 UNIT/ML FlexTouch Pen  INJECT 80 UNITS SUBCUTANEOUSLY IN THE MORNING 9 mL 0   valsartan  (DIOVAN ) 80 MG tablet Take 1 tablet by mouth once daily 90 tablet 0   vitamin B-12 (CYANOCOBALAMIN ) 1000 MCG tablet Take 1,000 mcg by mouth daily.     No current facility-administered medications on file prior to visit.    ALLERGIES: Nitrofurantoin, Levaquin [levofloxacin in d5w], Brimonidine, Cephalexin , Erythromycin ethylsuccinate, and Oxycodone   SH:Aaron Aas  Married, non-smoker  Review of Systems  Constitutional: Negative.   Genitourinary: Negative.     PHYSICAL EXAMINATION:    BP 132/61   Pulse 89   Ht 6' (1.829 m) Comment: Reported  Wt 258 lb (117 kg)   LMP 05/10/1992 Comment: partial  BMI 34.99 kg/m     General appearance: alert, cooperative and appears stated age  Lymph:  no inguinal LAD noted Pelvic: External genitalia:  no lesions              Urethra:  normal appearing urethra with no masses, tenderness or lesions              Bartholins and Skenes: normal                 Vagina: normal mucosa without prolapse or lesions and Estring  placed without difficulty.              Chaperone was present for exam.  Assessment/Plan: 1. Vaginal atrophy (Primary) - String placed today without difficulty.  She will return in about 4 months for repeat procedure.

## 2023-11-03 ENCOUNTER — Other Ambulatory Visit: Payer: Self-pay | Admitting: Adult Health

## 2023-11-03 DIAGNOSIS — H04123 Dry eye syndrome of bilateral lacrimal glands: Secondary | ICD-10-CM | POA: Diagnosis not present

## 2023-11-03 DIAGNOSIS — H02824 Cysts of left upper eyelid: Secondary | ICD-10-CM | POA: Diagnosis not present

## 2023-11-03 DIAGNOSIS — H401123 Primary open-angle glaucoma, left eye, severe stage: Secondary | ICD-10-CM | POA: Diagnosis not present

## 2023-11-03 DIAGNOSIS — H401112 Primary open-angle glaucoma, right eye, moderate stage: Secondary | ICD-10-CM | POA: Diagnosis not present

## 2023-11-10 ENCOUNTER — Other Ambulatory Visit: Payer: Self-pay | Admitting: Adult Health

## 2023-11-10 DIAGNOSIS — R0602 Shortness of breath: Secondary | ICD-10-CM

## 2023-11-14 ENCOUNTER — Other Ambulatory Visit: Payer: Self-pay | Admitting: Family Medicine

## 2023-11-17 ENCOUNTER — Ambulatory Visit (HOSPITAL_BASED_OUTPATIENT_CLINIC_OR_DEPARTMENT_OTHER): Admitting: Obstetrics & Gynecology

## 2023-11-22 DIAGNOSIS — K604 Rectal fistula, unspecified: Secondary | ICD-10-CM | POA: Diagnosis not present

## 2023-11-22 DIAGNOSIS — K603 Anal fistula, unspecified: Secondary | ICD-10-CM | POA: Diagnosis not present

## 2023-11-28 DIAGNOSIS — L578 Other skin changes due to chronic exposure to nonionizing radiation: Secondary | ICD-10-CM | POA: Diagnosis not present

## 2023-11-28 DIAGNOSIS — L821 Other seborrheic keratosis: Secondary | ICD-10-CM | POA: Diagnosis not present

## 2023-11-28 DIAGNOSIS — L218 Other seborrheic dermatitis: Secondary | ICD-10-CM | POA: Diagnosis not present

## 2023-11-28 DIAGNOSIS — D224 Melanocytic nevi of scalp and neck: Secondary | ICD-10-CM | POA: Diagnosis not present

## 2023-11-28 DIAGNOSIS — D2271 Melanocytic nevi of right lower limb, including hip: Secondary | ICD-10-CM | POA: Diagnosis not present

## 2023-11-29 ENCOUNTER — Encounter: Payer: Self-pay | Admitting: Family Medicine

## 2023-11-29 ENCOUNTER — Ambulatory Visit (INDEPENDENT_AMBULATORY_CARE_PROVIDER_SITE_OTHER): Payer: Medicare PPO | Admitting: Family Medicine

## 2023-11-29 VITALS — BP 118/70 | HR 77 | Temp 97.6°F | Ht 72.0 in | Wt 253.0 lb

## 2023-11-29 DIAGNOSIS — F3342 Major depressive disorder, recurrent, in full remission: Secondary | ICD-10-CM

## 2023-11-29 DIAGNOSIS — I10 Essential (primary) hypertension: Secondary | ICD-10-CM | POA: Diagnosis not present

## 2023-11-29 DIAGNOSIS — Z0000 Encounter for general adult medical examination without abnormal findings: Secondary | ICD-10-CM | POA: Diagnosis not present

## 2023-11-29 DIAGNOSIS — Z7985 Long-term (current) use of injectable non-insulin antidiabetic drugs: Secondary | ICD-10-CM | POA: Diagnosis not present

## 2023-11-29 DIAGNOSIS — E119 Type 2 diabetes mellitus without complications: Secondary | ICD-10-CM

## 2023-11-29 DIAGNOSIS — I5032 Chronic diastolic (congestive) heart failure: Secondary | ICD-10-CM

## 2023-11-29 DIAGNOSIS — E782 Mixed hyperlipidemia: Secondary | ICD-10-CM

## 2023-11-29 NOTE — Progress Notes (Signed)
 Phone [phone]   Subjective:  Patient presents today for their annual physical. Chief complaint-noted.   See problem oriented charting- ROS- full  review of systems was completed and negative Per full ROS sheet completed by patient except for topics noted under acute/chronic concerns  The following were reviewed and entered/updated in epic: Past Medical History:  Diagnosis Date   Allergy    Anemia    Anxiety    Arthritis    right knee;injections every 6months    Asthma    use daily   Breast cancer (HCC) 1994/1995   HX BREAST CANCER/ right brreast and left breast in 1995   Bronchiectasis (HCC)    COMPRESSION FRACTURE, LUMBAR VERTEBRAE 08/21/2008   Qualifier: Diagnosis of  By: Mavis MD, Norleen BRAVO    Depression    Diabetes mellitus    takes Amaryl  and Januvia  daily   Diverticulitis    Dyspnea    with activity   Early cataracts, bilateral    Eczema    Endometriosis    Fatty liver    Gastritis    Glaucoma    Hammer toe    History of kidney stones    Hyperlipidemia associated with type 2 diabetes mellitus (HCC) 07/26/2007   Diet/exercise control.     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   Thyroid  disease    Vitamin D  deficiency    takes Vit d daily   Weakness    uses a walker and wheelchair   Patient Active Problem List   Diagnosis Date Noted   Perineal sinus  03/25/2021    Priority: High   Chronic UTI 02/23/2021    Priority: High   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 11/22/2019    Priority: High   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    Priority: High   Bronchiectasis (HCC) 10/18/2015    Priority: High   Recurrent major depression in full remission (HCC) 01/03/2014    Priority: High   Diabetes mellitus type II, controlled (HCC) 12/28/2006    Priority: High   Chronic diastolic heart failure (HCC) 09/02/2021    Priority: Medium    Dyspnea on exertion 02/22/2019    Priority: Medium    Adenomatous polyp 12/20/2018    Priority: Medium    History of colonic polyps 05/18/2017    Priority: Medium    Aortic atherosclerosis (HCC) 02/16/2016    Priority: Medium    Solitary pulmonary nodule 01/08/2016    Priority: Medium    IBS (irritable bowel syndrome) 07/17/2014    Priority: Medium    Essential hypertension 01/03/2014    Priority: Medium    Primary open-angle glaucoma 08/02/2012    Priority: Medium    Fatty liver 03/18/2009  Priority: Medium    Hyperlipidemia 07/26/2007    Priority: Medium    ANEMIA, B12 DEFICIENCY 12/29/2006    Priority: Medium    RESTLESS LEG SYNDROME, MILD 12/28/2006    Priority: Medium    Osteoporosis 12/12/2006    Priority: Medium    Pain due to onychomycosis of toenails of both feet 11/08/2018    Priority: Low   History of total knee replacement, bilateral 11/14/2017    Priority: Low   Arthritis of midfoot 03/03/2016    Priority: Low   Hammertoes of both feet 03/03/2016    Priority: Low   Posterior tibial tendon dysfunction 01/03/2014    Priority: Low   Osteoarthritis, knee 01/03/2014    Priority: Low   Glaucoma 01/03/2014    Priority: Low   Obesity (BMI 30-39.9) 06/15/2013    Priority: Low   Foot tendinitis 07/20/2010    Priority: Low   INSOMNIA, CHRONIC 07/25/2009    Priority: Low   GERD 12/25/2007    Priority: Low   Idiopathic peripheral neuropathy 12/28/2006    Priority: Low    BREAST CANCER, HX OF 12/12/2006    Priority: Low   Abnormal CT scan, colon 08/30/2023   Polyp of transverse colon 08/30/2023   Polyp of rectum 08/30/2023   Chronic cystitis 11/12/2022   Rectal fistula 11/12/2022   Chronic rhinitis 09/14/2022   Right lower quadrant abdominal pain 06/16/2021   Hoarseness 10/17/2020   Physical deconditioning 02/22/2019   Past Surgical History:  Procedure Laterality Date   ADENOIDECTOMY     at age 62   ANAL FISTULOTOMY N/A 03/07/2020   Procedure: ANAL FISTULOTOMY WITH MARSUPIALIZATION;  Surgeon: Sheldon Standing, MD;  Location: [facility];  Service: General;  Laterality: N/A;   BIOPSY  12/19/2018   Procedure: BIOPSY;  Surgeon: Shila Gustav GAILS, MD;  Location: [facility];  Service: Endoscopy;;   CATARACT EXTRACTION     CHOLECYSTECTOMY  06/2008   COLONOSCOPY N/A 08/30/2023   Procedure: COLONOSCOPY;  Surgeon: Shila Gustav GAILS, MD;  Location: [facility];  Service: Gastroenterology;  Laterality: N/A;   COLONOSCOPY WITH PROPOFOL  N/A 05/17/2017   Procedure: COLONOSCOPY WITH PROPOFOL ;  Surgeon: Shila Gustav GAILS, MD;  Location: [facility];  Service: Endoscopy;  Laterality: N/A;  PT WILL BE ADMITTED THE DAY BEFORE FOR PREP PER ROBIN KB   COLONOSCOPY WITH PROPOFOL  N/A 12/19/2018   Procedure: COLONOSCOPY WITH PROPOFOL ;  Surgeon: Shila Gustav GAILS, MD;  Location: [facility];  Service: Endoscopy;  Laterality: N/A;   DILATION AND CURETTAGE OF UTERUS     excision on breast     internal infected suture from breast surgery   excision removed from neck  2008   infected lymph node   EXTRACORPOREAL SHOCK WAVE LITHOTRIPSY Right 07/20/2021   Procedure: EXTRACORPOREAL SHOCK WAVE LITHOTRIPSY (ESWL);  Surgeon: Cam Morene ORN, MD;  Location: [facility];  Service: Urology;  Laterality: Right;   EXTRACORPOREAL SHOCK WAVE LITHOTRIPSY Right 10/15/2021   Procedure: EXTRACORPOREAL SHOCK WAVE LITHOTRIPSY (ESWL);  Surgeon: Elisabeth Valli BIRCH, MD;  Location: [facility];  Service: Urology;  Laterality: Right;   HEMORRHOID SURGERY N/A 11/22/2019   Procedure: HEMORRHOIDECTOMY LIGATION , PEXY;  Surgeon: Sheldon Standing, MD;  Location: [facility] [place];  Service: General;  Laterality: N/A;   HYPERBARIC OXYGEN  THERAPY     FEET INFECTION   HYSTEROSCOPY  06/22/2011   PMB submucosal myoma   JOINT REPLACEMENT  2014   left total shoulder   LAPAROSCOPIC OOPHORECTOMY Right  12/2004   absent LSO   LAPAROTOMY  1977   LIGATION OF INTERNAL FISTULA TRACT N/A 11/22/2019   Procedure: REPAIR OF PERIRECTAL FISTULA, ANORECTAL EXAMINATION UNDER ANESTHESIA;  Surgeon: Sheldon Standing, MD;  Location: [facility] [place];  Service: General;  Laterality: N/A;   MASTECTOMY Bilateral 1994 right, 1995 left   PLACEMENT OF SETON N/A 03/19/2021   Procedure: LIMBERG RHOMBOID ROTATIONAL FLAP CLOSURE;  Surgeon: Sheldon Standing, MD;  Location: [facility];  Service: General;  Laterality: N/A;   POLYPECTOMY  12/19/2018   Procedure: POLYPECTOMY;  Surgeon: Shila Gustav GAILS, MD;  Location: [facility];  Service: Endoscopy;;   POLYPECTOMY  08/30/2023   Procedure: POLYPECTOMY, INTESTINE;  Surgeon: Shila Gustav GAILS, MD;  Location: [facility];  Service: Gastroenterology;;   RECTAL EXAM UNDER ANESTHESIA N/A 03/07/2020   Procedure: ANORECTAL EXAM UNDER ANESTHESIA;  Surgeon: Sheldon Standing, MD;  Location: [facility] [place];  Service: General;  Laterality: N/A;   RECTAL EXAM UNDER ANESTHESIA N/A 03/19/2021   Procedure: RECTAL EXAM UNDER ANESTHESIA;  Surgeon: Sheldon Standing, MD;  Location: [facility];  Service: General;  Laterality: N/A;   SHOULDER HEMI-ARTHROPLASTY  01/01/2012   Procedure: SHOULDER HEMI-ARTHROPLASTY;  Surgeon: Elsie Mussel, MD;  Location: [facility];  Service: Orthopedics;  Laterality: Left;  Left Shoulder Hemi Arthroplasty with Repair and Reconstruction as Necessary    THYROIDECTOMY  1975   73yrs ago. follows endocrine    TONSILLECTOMY     as a child   TOTAL KNEE ARTHROPLASTY Right 12/30/2014   Procedure: RIGHT TOTAL KNEE ARTHROPLASTY;  Surgeon: Dempsey Moan, MD;  Location: [facility];  Service: Orthopedics;  Laterality: Right;   TOTAL KNEE ARTHROPLASTY Left 11/29/2016   Procedure: LEFT TOTAL KNEE ARTHROPLASTY;  Surgeon: Moan Dempsey, MD;  Location: [facility];  Service: Orthopedics;  Laterality: Left;    Family History  Problem Relation Age of Onset   Arthritis Mother    COPD Father    Heart disease Father        MI 73 - states he died of lockjaw   Hypertension Father    Hyperlipidemia Father    Pancreatic cancer Paternal Grandfather    Colon cancer Neg Hx    Esophageal cancer Neg Hx    Rectal cancer Neg Hx    Stomach cancer Neg Hx     Medications- reviewed and updated Current Outpatient Medications  Medication Sig Dispense Refill   albuterol  (VENTOLIN  HFA) 108 (90 Base) MCG/ACT inhaler INHALE 2 PUFFS BY MOUTH EVERY 6 HOURS AS NEEDED FOR WHEEZING OR SHORTNESS OF BREATH 18 g 0   atenolol  (TENORMIN ) 25 MG tablet Take 1 tablet by mouth once daily 90 tablet 0   Bacillus Coagulans-Inulin (ALIGN PREBIOTIC-PROBIOTIC PO) Take 1 capsule by mouth daily.      BD PEN NEEDLE NANO 2ND GEN 32G X 4 MM MISC USE AS DIRECTED DAILY 100 each 0   Blood Glucose Monitoring Suppl (ACCU-CHEK GUIDE) w/Device KIT Use to test blood sugars daily. Dx: E11.9 1 kit 3   cetirizine (ZYRTEC) 10 MG tablet Take 10 mg by mouth every morning.      Cholecalciferol  (VITAMIN D3) 125 MCG (5000 UT) TABS Take 5,000 Units by mouth daily.     Coenzyme Q10 (COQ10) 200 MG CAPS Take 200 mg by mouth daily.     desvenlafaxine  (PRISTIQ ) 50 MG 24 hr tablet Take 1 tablet (50 mg total) by mouth daily. 90 tablet 3   dorzolamide -timolol  (COSOPT ) 22.3-6.8 MG/ML ophthalmic solution Place 1 drop into both eyes  2 (two) times daily.      estradiol  (ESTRING ) 7.5 MCG/24HR vaginal ring Place 1 each vaginally every 3 (three) months. 1 each 3   FIBER PO Take 2 tablets  by mouth every evening.     fluconazole  (DIFLUCAN ) 150 MG tablet TAKE ONE TABLET BY MOUTH AS A ONE-TIME DOSE REPEAT  IN  48  HOURS  IF  SYMPTOMS  ARE  NOT  COMPLETELY  RESOLVED 2 tablet 0   fluticasone  (FLONASE ) 50 MCG/ACT nasal spray Use 1 spray(s) in each nostril once daily 16 g 0   fosfomycin (MONUROL) 3 g PACK Take 3 g by mouth once. Once per month on external instructions- she reports was told once every 10 days per urology     glimepiride  (AMARYL ) 4 MG tablet Take 1 tablet (4 mg total) by mouth daily with breakfast. 180 tablet 3   glucose blood (ACCU-CHEK GUIDE) test strip USE 1 STRIP ONCE DAILY TO  CHECK  GLUCOSE. Dx: E11.9 100 each 3   ipratropium (ATROVENT ) 0.03 % nasal spray Place 2 sprays into both nostrils 2 (two) times daily as needed for rhinitis. 30 mL 5   Lancets Misc. (ACCU-CHEK FASTCLIX LANCET) KIT Use to test blood sugars daily. Dx: E11.9 1 kit 5   latanoprost  (XALATAN ) 0.005 % ophthalmic solution Place 1 drop into the right eye at bedtime.      loperamide  (IMODIUM ) 2 MG capsule Take 2 capsules (4 mg total) by mouth 3 (three) times daily as needed for diarrhea or loose stools. 12 capsule 0   lovastatin  (MEVACOR ) 10 MG tablet Take 1 tablet by mouth once daily 90 tablet 0   methenamine  (HIPREX ) 1 g tablet Take 1 tablet (1 g total) by mouth 2 (two) times daily with a meal. 60 tablet 11   montelukast  (SINGULAIR ) 10 MG tablet TAKE 1 TABLET BY MOUTH AT BEDTIME . APPOINTMENT REQUIRED FOR FUTURE REFILLS 30 tablet 11   Multiple Vitamin (MULTIVITAMIN WITH MINERALS) TABS tablet Take 1 tablet by mouth daily. 3 Fruit Capsules 3 Vegetable Capsules - Balance of Nature 30 tablet 0   MYRBETRIQ 50 MG TB24 tablet Take 1 tablet by mouth once daily 30 tablet 0   OZEMPIC , 1 MG/DOSE, 4 MG/3ML SOPN INJECT 1MG  SUBCUTANEOUSLY ONCE A WEEK 3 mL 0   Selenium 200 MCG CAPS Take 200 mcg by mouth at bedtime.      SYMBICORT  80-4.5 MCG/ACT inhaler Inhale 2 puffs by mouth twice daily 11 g 0   tamsulosin  (FLOMAX )  0.4 MG CAPS capsule Take 1 capsule by mouth at bedtime 30 capsule 0   temazepam  (RESTORIL ) 15 MG capsule Take 1 capsule (15 mg total) by mouth at bedtime. 30 capsule 5   terconazole  (TERAZOL 7 ) 0.4 % vaginal cream Place 1 applicator vaginally at bedtime. 45 g 0   TRESIBA  FLEXTOUCH 200 UNIT/ML FlexTouch Pen INJECT 80 UNITS SUBCUTANEOUSLY IN THE MORNING 9 mL 0   valsartan  (DIOVAN ) 80 MG tablet Take 1 tablet by mouth once daily 90 tablet 0   vitamin B-12 (CYANOCOBALAMIN ) 1000 MCG tablet Take 1,000 mcg by mouth daily.     No current facility-administered medications for this visit.    Allergies-reviewed and updated Allergies  Allergen Reactions   Nitrofurantoin Other (See Comments)    Severe headache HEADACHE   Levaquin [Levofloxacin In D5w] Other (See Comments)    Pain in tendons    Brimonidine Other (See Comments)    Made eyes and surrounding areas RED/ was an eye drop  Cephalexin  Diarrhea and Other (See Comments)    Patient can't remember reaction (per chart at [facility] states diarrhea) (tolerates Augmentin  fine)   Erythromycin Ethylsuccinate Hives and Diarrhea   Oxycodone  Itching    Social History   Social History Narrative   Married 39 years in 2015. 1 adopted son. 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 Oropharynx normal Neck: no thyromegaly CV: RRR no murmurs rubs or gallops Lungs: CTAB no crackles, wheeze, rhonchi Abdomen: soft/nontender/nondistended/normal bowel sounds. No rebound or guarding.  Ext: no edema Skin: warm, dry Neuro: grossly normal, moves all extremities, PERRLA   Assessment and Plan   83 y.o. female presenting for annual physical.   Health Maintenance counseling: 1. Anticipatory guidance: Patient counseled regarding regular dental exams -advised q6 months, eye exams - regular eye exams,  avoiding smoking and second hand smoke , limiting alcohol to 1 beverage per day- non , no illicit drugs .   2. Risk factor reduction:  Advised patient of need for regular exercise and diet rich and fruits and vegetables to reduce risk of heart attack and stroke.  Exercise- doing foot pedals hour 3 days a week.  Diet/weight management-down 6 lbs from last visit- going in right direction- could cut out her ice cream.  Wt Readings from Last 3 Encounters:  11/29/23 253 lb (114.8 kg)  10/26/23 258 lb (117 kg)  10/14/23 258 lb (117 kg)  3. Immunizations/screenings/ancillary studies- up to date , Tetanus, Diphtheria, and Pertussis (Tdap) at pharmacy Immunization History  Administered Date(s) Administered   Fluad Quad(high Dose 65+) 02/22/2019, 02/20/2020, 02/18/2021, 01/27/2022   H1N1 06/05/2008   Influenza Split 02/07/2012   Influenza Whole 03/10/2000, 01/24/2008, 03/10/2009, 01/20/2010   Influenza, High Dose Seasonal PF 02/12/2013, 01/31/2017, 01/19/2018   Influenza,inj,Quad PF,6+ Mos 01/17/2014, 03/27/2015   Influenza,inj,quad, With Preservative 02/07/2017   Influenza-Unspecified 02/09/2016, 02/23/2023   PFIZER(Purple Top)SARS-COV-2 Vaccination 06/03/2019, 06/25/2019, 04/24/2020   PNEUMOCOCCAL CONJUGATE-20 04/27/2022   Pneumococcal Conjugate-13 01/17/2014   Pneumococcal Polysaccharide-23 03/10/2000, 05/02/2007, 01/02/2012   Respiratory Syncytial Virus Vaccine,Recomb Aduvanted(Arexvy) 03/08/2022   Td 05/10/1992, 12/25/2007   Zoster Recombinant(Shingrix) 02/24/2018, 04/26/2018   4. Cervical cancer screening- still sees GYN every 6 months ut past age based screening recommendations  5. Breast cancer screening-  double mastectomy 1995- no chest wall abnormalities and sees gynecology 6. Colon cancer screening - final colonoscopy age 68-no  further colonoscopy per Dr. Shila- from notes June 2025- he polyp(s) removed were benign, one of them is precancerous.  Normally 5 year colonoscopy follow up is recommended.  In general, we stop routine screening  colonoscopy around 80.  Accordingly, we will not contact you to schedule a follow up exam.  7. Skin cancer screening- Dr. Cary yearly- had visit yesterday. advised regular sunscreen use. Denies worrisome, changing, or new skin lesions.  8. Birth control/STD check- only active with husband 9. Osteoporosis screening at 89- osteoporosis in past- offered DEXA- declines again today 10. Smoking associated screening - never smoker  Status of chronic or acute concerns   # Diabetes S: Medication:Glimepiride  8 mg daily, Ozempic  1  mg, Tresiba  92 units lately. Did have to miss some Ozempic  dosages with surgery CBGs- no low sugars. Fluctuates and usually below 100 Lab Results  Component Value Date   HGBA1C 6.8 (H) 06/14/2023   HGBA1C 7.3 (H) 12/24/2022   HGBA1C 7.6 (H) 08/24/2022   A/P: hopefully stable- update a1c today. Continue current meds for now  -fi any sugars below 80 may need to go back down   #hypertension S: medication: Atenolol  25 mg daily, valsartan  80 mg daily A/P: stable- continue current medicines   #hyperlipidemia # Aortic atherosclerosis S: Medication:Lovastatin  10 mg daily  Lab Results  Component Value Date   CHOL 112 12/24/2022   HDL 34.70 (L) 12/24/2022   LDLCALC 58 12/24/2022   LDLDIRECT 77.0 02/16/2016   TRIG 97.0 12/24/2022   CHOLHDL 3 12/24/2022  A/P: hopefully stable- update lipid panel today. Continue current meds for now   # Bronchiectasis-follows with pulmonary-on Symbicort  80-4.5 mg 2 puffs twice daily -singular available as well   # Restless legs # Insomnia # Neuropathic pain/neuropathy S: Medication: NONE -in pastGabapentin 100 mg up to 3 times daily as needed- w  -Also on Mirapex  0.5 mg at bedtime -Also requires temazepam  15 mg for  insomnia A/P: restless legs and neuropathy better lately- continue to monitor - have mirapex  if needed but need  Insomnia- temazepam  helpful- continue current medications    # Depression S: Medication: Pristiq  50 mg daily     11/29/2023    2:21 PM 10/26/2023   11:09 AM 06/16/2023    1:59 PM  Depression screen PHQ 2/9  Decreased Interest 0 0 0  Down, Depressed, Hopeless 0 0 0  PHQ - 2 Score 0 0 0  Altered sleeping 0    Tired, decreased energy 0    Change in appetite 0    Feeling bad or failure about yourself  0    Trouble concentrating 0    Moving slowly or fidgety/restless 0    Suicidal thoughts 0    PHQ-9 Score 0    Difficult doing work/chores Not difficult at all    A/P: full remission- continue current medications     # Recurrent UTI-on tamsulosin  0.4 mg daily, methenamine  1 g twice daily,-fosfomycin per urology every 10 days as well 3 g- has upcoming appointment with urology to discuss further -Prior fistula surgery  x3with Dr. Beecher fistula surgery with Dr. Cy Brochure [facility] June 2025- did well with surgery and no ongoing drainage -also on myrbetriq  # IBS-on dicyclomine  20 mg up to 4 times a day in past- not needing now   #chronic diastolic CHF- needed intermittent fluid pills in past but no recent need. We will monitor- but I do not think this is going of the be an issue for her with only grade I diastolic dysfunction- look out for swelling or weight gain  Recommended follow up: Return in about 4 months (around 03/31/2024) for followup or sooner if needed.Schedule b4 you leave. Future Appointments  Date Time Provider Department Center  02/13/2024  2:15 PM Christine Rush, [place] [facility]  02/27/2024  1:35 PM Cleotilde Ronal RAMAN, MD [facility]   Lab/Order associations: fasting   ICD-10-CM   1. Preventative health care  Z00.00     2. Controlled type 2 diabetes mellitus without complication, without long-term current use of insulin  (HCC)   E11.9 Comprehensive metabolic panel with GFR    CBC with Differential/Platelet    Lipid panel    Hemoglobin A1c  Microalbumin / creatinine urine ratio    3. Chronic diastolic heart failure (HCC)  P49.67     4. Recurrent major depression in full remission (HCC)  F33.42     5. Essential hypertension  I10     6. Mixed hyperlipidemia  E78.2       No orders of the defined types were placed in this encounter.   Return precautions advised.  Garnette Lukes, MD

## 2023-11-29 NOTE — Patient Instructions (Addendum)
 Schedule a lab visit at the check out desk within 2 weeks. Return for future fasting labs meaning nothing but water  after midnight please. Ok to take your medications with water .   Schedule follow up with dentist  Tetanus, Diphtheria, and Pertussis (Tdap) at pharmacy   Recommended follow up: Return in about 4 months (around 03/31/2024) for followup or sooner if needed.Schedule b4 you leave.

## 2023-11-30 ENCOUNTER — Other Ambulatory Visit: Payer: Self-pay

## 2023-11-30 ENCOUNTER — Ambulatory Visit: Payer: Self-pay | Admitting: Family Medicine

## 2023-11-30 LAB — CBC WITH DIFFERENTIAL/PLATELET
Basophils Absolute: 0.1 K/uL (ref 0.0–0.1)
Basophils Relative: 0.7 % (ref 0.0–3.0)
Eosinophils Absolute: 0.3 K/uL (ref 0.0–0.7)
Eosinophils Relative: 2.9 % (ref 0.0–5.0)
HCT: 40.5 % (ref 36.0–46.0)
Hemoglobin: 13.6 g/dL (ref 12.0–15.0)
Lymphocytes Relative: 12.7 % (ref 12.0–46.0)
Lymphs Abs: 1.2 K/uL (ref 0.7–4.0)
MCHC: 33.6 g/dL (ref 30.0–36.0)
MCV: 89.8 fl (ref 78.0–100.0)
Monocytes Absolute: 0.7 K/uL (ref 0.1–1.0)
Monocytes Relative: 7 % (ref 3.0–12.0)
Neutro Abs: 7.2 K/uL (ref 1.4–7.7)
Neutrophils Relative %: 76.7 % (ref 43.0–77.0)
Platelets: 250 K/uL (ref 150.0–400.0)
RBC: 4.51 Mil/uL (ref 3.87–5.11)
RDW: 14.4 % (ref 11.5–15.5)
WBC: 9.4 K/uL (ref 4.0–10.5)

## 2023-11-30 LAB — LIPID PANEL
Cholesterol: 103 mg/dL (ref 0–200)
HDL: 38.9 mg/dL — ABNORMAL LOW (ref >39.00)
LDL Cholesterol: 48 mg/dL (ref 0–99)
NonHDL: 64.24
Total CHOL/HDL Ratio: 3
Triglycerides: 83 mg/dL (ref 0.0–149.0)
VLDL: 16.6 mg/dL (ref 0.0–40.0)

## 2023-11-30 LAB — COMPREHENSIVE METABOLIC PANEL WITH GFR
ALT: 14 U/L (ref 0–35)
AST: 17 U/L (ref 0–37)
Albumin: 3.9 g/dL (ref 3.5–5.2)
Alkaline Phosphatase: 53 U/L (ref 39–117)
BUN: 16 mg/dL (ref 6–23)
CO2: 31 meq/L (ref 19–32)
Calcium: 9.3 mg/dL (ref 8.4–10.5)
Chloride: 103 meq/L (ref 96–112)
Creatinine, Ser: 0.59 mg/dL (ref 0.40–1.20)
GFR: 83.07 mL/min (ref >60.00)
Glucose, Bld: 102 mg/dL — ABNORMAL HIGH (ref 70–99)
Potassium: 4.3 meq/L (ref 3.5–5.1)
Sodium: 141 meq/L (ref 135–145)
Total Bilirubin: 0.5 mg/dL (ref 0.2–1.2)
Total Protein: 6.7 g/dL (ref 6.0–8.3)

## 2023-11-30 LAB — MICROALBUMIN / CREATININE URINE RATIO
Creatinine,U: 121.8 mg/dL
Microalb Creat Ratio: 32.9 mg/g — ABNORMAL HIGH (ref 0.0–30.0)
Microalb, Ur: 4 mg/dL — ABNORMAL HIGH (ref 0.0–1.9)

## 2023-11-30 LAB — HEMOGLOBIN A1C: Hgb A1c MFr Bld: 6.8 % — ABNORMAL HIGH (ref 4.6–6.5)

## 2023-11-30 MED ORDER — VALSARTAN 160 MG PO TABS: 160.0000 mg | ORAL_TABLET | Freq: Every day | ORAL | 3 refills | Status: DC

## 2023-12-03 ENCOUNTER — Other Ambulatory Visit: Payer: Self-pay | Admitting: Family Medicine

## 2023-12-12 ENCOUNTER — Other Ambulatory Visit: Payer: Self-pay | Admitting: Adult Health

## 2023-12-12 ENCOUNTER — Other Ambulatory Visit: Payer: Self-pay | Admitting: Family Medicine

## 2023-12-12 DIAGNOSIS — R0602 Shortness of breath: Secondary | ICD-10-CM

## 2023-12-13 ENCOUNTER — Other Ambulatory Visit: Payer: Self-pay

## 2023-12-13 ENCOUNTER — Emergency Department (HOSPITAL_COMMUNITY)

## 2023-12-13 ENCOUNTER — Emergency Department (HOSPITAL_COMMUNITY)
Admission: EM | Admit: 2023-12-13 | Discharge: 2023-12-13 | Disposition: A | Discharge status: Home / Self Care | Attending: Emergency Medicine | Admitting: Emergency Medicine

## 2023-12-13 ENCOUNTER — Encounter (HOSPITAL_COMMUNITY): Payer: Self-pay | Admitting: Emergency Medicine

## 2023-12-13 DIAGNOSIS — R531 Weakness: Secondary | ICD-10-CM | POA: Insufficient documentation

## 2023-12-13 DIAGNOSIS — Z79899 Other long term (current) drug therapy: Secondary | ICD-10-CM | POA: Insufficient documentation

## 2023-12-13 DIAGNOSIS — E119 Type 2 diabetes mellitus without complications: Secondary | ICD-10-CM | POA: Insufficient documentation

## 2023-12-13 DIAGNOSIS — R5383 Other fatigue: Secondary | ICD-10-CM | POA: Diagnosis present

## 2023-12-13 DIAGNOSIS — Z7951 Long term (current) use of inhaled steroids: Secondary | ICD-10-CM | POA: Diagnosis not present

## 2023-12-13 DIAGNOSIS — J45909 Unspecified asthma, uncomplicated: Secondary | ICD-10-CM | POA: Insufficient documentation

## 2023-12-13 DIAGNOSIS — I700 Atherosclerosis of aorta: Secondary | ICD-10-CM | POA: Diagnosis not present

## 2023-12-13 DIAGNOSIS — R5381 Other malaise: Secondary | ICD-10-CM | POA: Diagnosis not present

## 2023-12-13 DIAGNOSIS — Z7984 Long term (current) use of oral hypoglycemic drugs: Secondary | ICD-10-CM | POA: Insufficient documentation

## 2023-12-13 DIAGNOSIS — D72829 Elevated white blood cell count, unspecified: Secondary | ICD-10-CM | POA: Insufficient documentation

## 2023-12-13 DIAGNOSIS — Z853 Personal history of malignant neoplasm of breast: Secondary | ICD-10-CM | POA: Diagnosis not present

## 2023-12-13 DIAGNOSIS — I10 Essential (primary) hypertension: Secondary | ICD-10-CM | POA: Insufficient documentation

## 2023-12-13 LAB — COMPREHENSIVE METABOLIC PANEL WITH GFR
ALT: 13 U/L (ref 0–44)
AST: 17 U/L (ref 15–41)
Albumin: 3.4 g/dL — ABNORMAL LOW (ref 3.5–5.0)
Alkaline Phosphatase: 57 U/L (ref 38–126)
Anion gap: 12 (ref 5–15)
BUN: 14 mg/dL (ref 8–23)
CO2: 26 mmol/L (ref 22–32)
Calcium: 8.8 mg/dL — ABNORMAL LOW (ref 8.9–10.3)
Chloride: 103 mmol/L (ref 98–111)
Creatinine, Ser: 0.56 mg/dL (ref 0.44–1.00)
GFR, Estimated: >60 mL/min
Glucose, Bld: 127 mg/dL — ABNORMAL HIGH (ref 70–99)
Potassium: 4.2 mmol/L (ref 3.5–5.1)
Sodium: 141 mmol/L (ref 135–145)
Total Bilirubin: 0.8 mg/dL (ref 0.0–1.2)
Total Protein: 6.6 g/dL (ref 6.5–8.1)

## 2023-12-13 LAB — URINALYSIS, ROUTINE W REFLEX MICROSCOPIC
Bilirubin Urine: NEGATIVE
Glucose, UA: NEGATIVE mg/dL
Hgb urine dipstick: NEGATIVE
Ketones, ur: NEGATIVE mg/dL
Nitrite: NEGATIVE
Protein, ur: NEGATIVE mg/dL
Specific Gravity, Urine: 1.011 (ref 1.005–1.030)
pH: 5 (ref 5.0–8.0)

## 2023-12-13 LAB — CBC
HCT: 43.5 % (ref 36.0–46.0)
Hemoglobin: 13.8 g/dL (ref 12.0–15.0)
MCH: 29.4 pg (ref 26.0–34.0)
MCHC: 31.7 g/dL (ref 30.0–36.0)
MCV: 92.8 fL (ref 80.0–100.0)
Platelets: 262 10*3/uL (ref 150–400)
RBC: 4.69 MIL/uL (ref 3.87–5.11)
RDW: 13.5 % (ref 11.5–15.5)
WBC: 10.4 10*3/uL (ref 4.0–10.5)
nRBC: 0 % (ref 0.0–0.2)

## 2023-12-13 LAB — TROPONIN I (HIGH SENSITIVITY)
Troponin I (High Sensitivity): 3 ng/L (ref <18)
Troponin I (High Sensitivity): 3 ng/L (ref <18)

## 2023-12-13 MED ORDER — SODIUM CHLORIDE 0.9 % IV BOLUS
500.0000 mL | Freq: Once | INTRAVENOUS | Status: AC
  Administered 2023-12-13: 500 mL via INTRAVENOUS

## 2023-12-13 NOTE — ED Provider Triage Note (Signed)
 Emergency Medicine Provider Triage Evaluation Note  [name] , a 83 y.o. female  was evaluated in triage.  Pt complains of fatigue.  Pt said her PCP doubled her Losartan because she had protein in her urine.   Since then, her BP has been low and she's not been feeling well.  BP machine at home showed a SBP in the 90s, so she called EMS.  She had a little CP as well, but that is gone now.  Review of Systems  Positive: Weakness, low bp, CP Negative: Fever, chills  Physical Exam  BP (!) 139/56 (BP Location: Right Arm)   Pulse 74   Temp 97.9 F (36.6 C) (Oral)   Resp 16   Ht 6' (1.829 m)   Wt 113.4 kg   LMP 05/10/1992 Comment: partial  SpO2 97%   BMI 33.91 kg/m  Gen:   Awake, no distress   Resp:  Normal effort  MSK:   Moves extremities without difficulty  Other:    Medical Decision Making  Medically screening exam initiated at 4:58 PM.  Appropriate orders placed.  JULIANI LADUKE was informed that the remainder of the evaluation will be completed by another provider, this initial triage assessment does not replace that evaluation, and the importance of remaining in the ED until their evaluation is complete.     Dean Clarity, MD 12/13/23 1700

## 2023-12-13 NOTE — Discharge Instructions (Signed)
 As we discussed, your workup in the ER today was reassuring for acute findings.  Laboratory evaluation, EKG, and x-ray did not reveal any emergent cause of your symptoms.  Given this, suspect your symptoms are due to the recent increase in the dose of your losartan.  Please go back to taking only 10 mg daily and reach out to your PCP for further instructions.  Please follow-up closely with them as well.  Return if development of any new or worsening symptoms.

## 2023-12-13 NOTE — ED Provider Notes (Signed)
 [facility] AT [facility] Provider Note   CSN: [id number] Arrival date & time: 12/13/23  1455     Patient presents with: Fatigue   [name] is a 83 y.o. female.   Patient with history of hyperlipidemia, diabetes, hypertension presents today with complaints of fatigue. Reports her symptoms have been ongoing for 1 week since she was told to double her dose of losartan. Reports that she was told to do so because there was protein in her urine. Reports her home blood pressures have been in the 90s. She reached out to her doctor but has not heard back. Reports she did have an episode of chest pain and shortness of breath this morning but this has since resolved. Denies headaches, cough, congestion, fevers, chills, abdominal pain, nausea, vomiting, diarrhea, or dysuria.   The history is provided by the patient. No language interpreter was used.       Prior to Admission medications   Medication Sig Start Date End Date Taking? Authorizing Provider  albuterol  (VENTOLIN  HFA) 108 (90 Base) MCG/ACT inhaler INHALE 2 PUFFS BY MOUTH EVERY 6 HOURS AS NEEDED FOR WHEEZING OR SHORTNESS OF BREATH 11/04/23   Parrett, Madelin RAMAN, NP  atenolol  (TENORMIN ) 25 MG tablet Take 1 tablet by mouth once daily 09/19/23   Katrinka Garnette KIDD, MD  Bacillus Coagulans-Inulin (ALIGN PREBIOTIC-PROBIOTIC PO) Take 1 capsule by mouth daily.     [provider]  BD PEN NEEDLE NANO 2ND GEN 32G X 4 MM MISC USE AS DIRECTED DAILY 10/18/23   Katrinka Garnette KIDD, MD  Blood Glucose Monitoring Suppl (ACCU-CHEK GUIDE) w/Device KIT Use to test blood sugars daily. Dx: E11.9 10/04/23   Katrinka Garnette KIDD, MD  cetirizine (ZYRTEC) 10 MG tablet Take 10 mg by mouth every morning.     [provider]  Cholecalciferol  (VITAMIN D3) 125 MCG (5000 UT) TABS Take 5,000 Units by mouth daily.    [provider]  Coenzyme Q10 (COQ10) 200 MG CAPS Take 200 mg by mouth daily.    [provider]   desvenlafaxine  (PRISTIQ ) 50 MG 24 hr tablet Take 1 tablet (50 mg total) by mouth daily. 05/16/23   Katrinka Garnette KIDD, MD  dorzolamide -timolol  (COSOPT ) 22.3-6.8 MG/ML ophthalmic solution Place 1 drop into both eyes 2 (two) times daily.  02/08/13   [provider]  estradiol  (ESTRING ) 7.5 MCG/24HR vaginal ring Place 1 each vaginally every 3 (three) months. 11/09/22   Cleotilde Ronal RAMAN, MD  FIBER PO Take 2 tablets by mouth every evening.    [provider]  fluconazole  (DIFLUCAN ) 150 MG tablet TAKE ONE TABLET BY MOUTH AS A ONE-TIME DOSE REPEAT  IN  48  HOURS  IF  SYMPTOMS  ARE  NOT  COMPLETELY  RESOLVED 09/12/23   Lo, Arland POUR, CNM  fluticasone  (FLONASE ) 50 MCG/ACT nasal spray Use 1 spray(s) in each nostril once daily 09/26/23   Byrum, Robert S, MD  fosfomycin (MONUROL) 3 g PACK Take 3 g by mouth once. Once per month on external instructions- she reports was told once every 10 days per urology 04/15/23   [provider]  glimepiride  (AMARYL ) 4 MG tablet Take 1 tablet (4 mg total) by mouth daily with breakfast. 10/25/23   Katrinka Garnette KIDD, MD  glucose blood (ACCU-CHEK GUIDE) test strip USE 1 STRIP ONCE DAILY TO  CHECK  GLUCOSE. Dx: E11.9 12/27/22   Katrinka Garnette KIDD, MD  ipratropium (ATROVENT ) 0.03 % nasal spray Place 2 sprays into  both nostrils 2 (two) times daily as needed for rhinitis. 09/15/23   Parrett, Madelin RAMAN, NP  Lancets Misc. (ACCU-CHEK FASTCLIX LANCET) KIT Use to test blood sugars daily. Dx: E11.9 08/23/19   Katrinka Garnette KIDD, MD  latanoprost  (XALATAN ) 0.005 % ophthalmic solution Place 1 drop into the right eye at bedtime.  05/20/18   [provider]  loperamide  (IMODIUM ) 2 MG capsule Take 2 capsules (4 mg total) by mouth 3 (three) times daily as needed for diarrhea or loose stools. 11/25/22   Arloa Suzen RAMAN, NP  lovastatin  (MEVACOR ) 10 MG tablet Take 1 tablet by mouth once daily 09/19/23   Katrinka Garnette KIDD, MD  methenamine  (HIPREX ) 1 g tablet Take 1 tablet (1 g total) by  mouth 2 (two) times daily with a meal. 01/27/22   Katrinka Garnette KIDD, MD  montelukast  (SINGULAIR ) 10 MG tablet TAKE 1 TABLET BY MOUTH AT BEDTIME . APPOINTMENT REQUIRED FOR FUTURE REFILLS 09/14/22   Parrett, Madelin RAMAN, NP  Multiple Vitamin (MULTIVITAMIN WITH MINERALS) TABS tablet Take 1 tablet by mouth daily. 3 Fruit Capsules 3 Vegetable Capsules - Balance of Nature 03/26/21   Lue Elsie BROCKS, MD  MYRBETRIQ 50 MG TB24 tablet Take 1 tablet by mouth once daily 09/06/23   Sherrilee Belvie CROME, MD  OZEMPIC , 1 MG/DOSE, 4 MG/3ML SOPN INJECT 1MG  SUBCUTANEOUSLY ONCE A WEEK 12/12/23   Katrinka Garnette KIDD, MD  Selenium 200 MCG CAPS Take 200 mcg by mouth at bedtime.     [provider]  SYMBICORT  80-4.5 MCG/ACT inhaler Inhale 2 puffs by mouth twice daily 12/13/23   Parrett, Madelin RAMAN, NP  tamsulosin  (FLOMAX ) 0.4 MG CAPS capsule Take 1 capsule by mouth at bedtime 01/24/23   McKenzie, Belvie CROME, MD  temazepam  (RESTORIL ) 15 MG capsule Take 1 capsule (15 mg total) by mouth at bedtime. 10/26/23   Katrinka Garnette KIDD, MD  terconazole  (TERAZOL 7 ) 0.4 % vaginal cream Place 1 applicator vaginally at bedtime. 11/09/22   Cleotilde Ronal RAMAN, MD  TRESIBA  FLEXTOUCH 200 UNIT/ML FlexTouch Pen INJECT 80 UNITS SUBCUTANEOUSLY IN THE MORNING 12/05/23   Katrinka Garnette KIDD, MD  valsartan  (DIOVAN ) 160 MG tablet Take 1 tablet (160 mg total) by mouth daily. 11/30/23   Katrinka Garnette KIDD, MD  vitamin B-12 (CYANOCOBALAMIN ) 1000 MCG tablet Take 1,000 mcg by mouth daily.    [provider]    Allergies: Nitrofurantoin, Levaquin [levofloxacin in d5w], Brimonidine, Cephalexin , Erythromycin ethylsuccinate, and Oxycodone     Review of Systems  Constitutional:  Positive for fatigue.  All other systems reviewed and are negative.   Updated Vital Signs BP 127/62   Pulse 80   Temp 98.6 F (37 C) (Oral)   Resp 16   Ht 6' (1.829 m)   Wt 113.4 kg   LMP 05/10/1992 Comment: partial  SpO2 100%   BMI 33.91 kg/m   Physical Exam Vitals and  nursing note reviewed.  Constitutional:      General: She is not in acute distress.    Appearance: Normal appearance. She is normal weight. She is not ill-appearing, toxic-appearing or diaphoretic.  HENT:     Head: Normocephalic and atraumatic.  Cardiovascular:     Rate and Rhythm: Normal rate and regular rhythm.     Heart sounds: Normal heart sounds.  Pulmonary:     Effort: Pulmonary effort is normal. No respiratory distress.     Breath sounds: Normal breath sounds.  Abdominal:     General: Abdomen is flat.     Palpations:  Abdomen is soft.     Tenderness: There is no abdominal tenderness.  Musculoskeletal:        General: Normal range of motion.     Cervical back: Normal range of motion.     Right lower leg: No edema.     Left lower leg: No edema.  Skin:    General: Skin is warm and dry.  Neurological:     General: No focal deficit present.     Mental Status: She is alert and oriented to person, place, and time.  Psychiatric:        Mood and Affect: Mood normal.        Behavior: Behavior normal.     (all labs ordered are listed, but only abnormal results are displayed) Labs Reviewed  COMPREHENSIVE METABOLIC PANEL WITH GFR - Abnormal; Notable for the following components:      Result Value   Glucose, Bld 127 (*)    Calcium  8.8 (*)    Albumin 3.4 (*)    All other components within normal limits  URINALYSIS, ROUTINE W REFLEX MICROSCOPIC - Abnormal; Notable for the following components:   APPearance HAZY (*)    Leukocytes,Ua SMALL (*)    Bacteria, UA RARE (*)    All other components within normal limits  CBC  TROPONIN I (HIGH SENSITIVITY)  TROPONIN I (HIGH SENSITIVITY)    EKG: None  Radiology: [facility] Chest Port 1 View Result Date: 12/13/2023 CLINICAL DATA:  [id number] Chest pain [id number]. malaise x 1 week. States her doctor increased her losartan and since then has been having issues with recall and sleep. States she has been taking her BP at home and concerned that it has  been too low. EXAM: PORTABLE CHEST 1 VIEW COMPARISON:  Chest x-ray 09/15/2023, CT chest 10/08/2021 a CT chest 06/10/2021 FINDINGS: The heart and mediastinal contours are within normal limits. Atherosclerotic plaque. No focal consolidation. Chronic coarsened interstitial markings with no overt pulmonary edema. No pleural effusion. No pneumothorax. No acute osseous abnormality.  Bilateral axillary vascular clips. IMPRESSION: 1. No active disease. 2.  Aortic Atherosclerosis (ICD10-I70.0). Electronically Signed   By: Morgane  Naveau M.D.   On: 12/13/2023 19:06     Procedures   Medications Ordered in the ED  sodium chloride  0.9 % bolus 500 mL (0 mLs Intravenous Stopped 12/13/23 1828)                                    Medical Decision Making Amount and/or Complexity of Data Reviewed Labs: ordered.   This patient is a 83 y.o. female who presents to the ED for concern of fatigue, this involves an extensive number of treatment options, and is a complaint that carries with it a high risk of complications and morbidity. The emergent differential diagnosis prior to evaluation includes, but is not limited to,  CVA, ACS, arrhythmia, syncope, orthostatic hypotension, sepsis, hypoglycemia, hypoxia, electrolyte disturbance, endocrine disorder, anemia, environmental exposure, polypharmacy  This is not an exhaustive differential.   Past Medical History / Co-morbidities / Social History:  has a past medical history of Allergy, Anemia, Anxiety, Arthritis, Asthma, Breast cancer (HCC) (1994/1995), Bronchiectasis (HCC), COMPRESSION FRACTURE, LUMBAR VERTEBRAE (08/21/2008), Depression, Diabetes mellitus, Diverticulitis, Dyspnea, Early cataracts, bilateral, Eczema, Endometriosis, Fatty liver, Gastritis, Glaucoma, Hammer toe, History of kidney stones, Hyperlipidemia associated with type 2 diabetes mellitus (HCC) (07/26/2007), Hypertension, IBS (irritable bowel syndrome), Joint pain, Neuropathy, Neuropathy due to medical  condition (  HCC), Open wound of second toe of left foot (in past), Osteomyelitis (HCC), Osteopenia, Perirectal fistula, Posterior tibial tendon dysfunction, Restless leg syndrome, Scoliosis, Severe esophageal dysplasia, Shingles, Thyroid  disease, Vitamin D  deficiency, and Weakness.  Additional history: Chart reviewed.  Physical Exam: Physical exam performed. The pertinent findings include: no acute physical exam abnormalities  Lab Tests: I ordered, and personally interpreted labs.  The pertinent results include:  UA with small leukocytes, rare bacteria, no WBCs, likely noninfectious. No other acute laboratory abnormalities. [place] troponin WNL   Imaging Studies: I ordered imaging studies including CXR. I independently visualized and interpreted imaging which showed   1. No active disease. 2.  Aortic Atherosclerosis  I agree with the radiologist interpretation.   Cardiac Monitoring:  The patient was maintained on a cardiac monitor.  Cardiac monitor showed an underlying rhythm of: sinus rhythm, no STEMI. I agree with this interpretation.   Disposition: After consideration of the diagnostic results and the patients response to treatment, I feel that emergency department workup does not suggest an emergent condition requiring admission or immediate intervention beyond what has been performed at this time. The plan is: Discharge with close patient follow-up and return precautions.  Patient's symptoms are likely related to the increased dose of her losartan as this is when her symptoms began.  Her blood pressures have been normal throughout her visit today.  Recommend that she go back to her previous dose of losartan and reach out to her doctor for further instructions. Patient feels well at this time and is ready to go home. She is able to walk to the bathroom without dyspnea with a steady gait. Low suspicion for ACS, PE, or CVA. Evaluation and diagnostic testing in the emergency department does not  suggest an emergent condition requiring admission or immediate intervention beyond what has been performed at this time.  Plan for discharge with close PCP follow-up.  Patient is understanding and amenable with plan, educated on red flag symptoms that would prompt immediate return.  Patient discharged in stable condition.  Final diagnoses:  Generalized weakness    ED Discharge Orders     None     An After Visit Summary was printed and given to the patient.      Nora Lauraine DELENA DEVONNA 12/13/23 2109    Ruthe Cornet, DO 12/13/23 2239

## 2023-12-13 NOTE — ED Triage Notes (Signed)
 Per [facility] pt coming from home c/o malaise x 1 week. States her doctor increased her losartan and since then has been having issues with recall and sleep. States she has been taking her BP at home and concerned that it has been too low.

## 2023-12-14 ENCOUNTER — Other Ambulatory Visit: Payer: Self-pay | Admitting: Family Medicine

## 2023-12-15 ENCOUNTER — Telehealth: Payer: Self-pay

## 2023-12-15 NOTE — Telephone Encounter (Signed)
 Transition Care Management Follow-up Telephone Call Date of discharge and from where: 12/13/23; [place] How have you been since you were released from the hospital? About the same; still weak Any questions or concerns? Yes  Items Reviewed: Did the pt receive and understand the discharge instructions provided? Yes  Medications obtained and verified? Yes  Other?   Any new allergies since your discharge? No  Dietary orders reviewed? No Do you have support at home? Yes   Home Care and Equipment/Supplies: Were home health services ordered? no If so, what is the name of the agency?   Has the agency set up a time to come to the patient's home? not applicable Were any new equipment or medical supplies ordered?  No What is the name of the medical supply agency?  Were you able to get the supplies/equipment? not applicable Do you have any questions related to the use of the equipment or supplies? No  Functional Questionnaire: (I = Independent and D = Dependent) ADLs: D  Bathing/Dressing- D  Meal Prep- D  Eating- I  Maintaining continence- I  Transferring/Ambulation- I  Managing Meds- I  Follow up appointments reviewed:  PCP Hospital f/u appt confirmed? Yes  Scheduled to see Dr Katrinka on 12/22/23 @ 1:20pm. Specialist Hospital f/u appt confirmed? NA  Are transportation arrangements needed? No  If their condition worsens, is the pt aware to call PCP or go to the Emergency Dept.? Yes Was the patient provided with contact information for the PCP's office or ED? Yes Was to pt encouraged to call back with questions or concerns? Yes

## 2023-12-17 ENCOUNTER — Other Ambulatory Visit: Payer: Self-pay | Admitting: Family Medicine

## 2023-12-22 ENCOUNTER — Ambulatory Visit: Admitting: Family Medicine

## 2023-12-22 ENCOUNTER — Encounter: Payer: Self-pay | Admitting: Family Medicine

## 2023-12-22 ENCOUNTER — Other Ambulatory Visit: Payer: Self-pay | Admitting: Family Medicine

## 2023-12-22 VITALS — BP 128/68 | HR 84 | Temp 97.9°F | Ht 72.0 in

## 2023-12-22 DIAGNOSIS — E782 Mixed hyperlipidemia: Secondary | ICD-10-CM

## 2023-12-22 DIAGNOSIS — E1129 Type 2 diabetes mellitus with other diabetic kidney complication: Secondary | ICD-10-CM

## 2023-12-22 DIAGNOSIS — Z7985 Long-term (current) use of injectable non-insulin antidiabetic drugs: Secondary | ICD-10-CM | POA: Diagnosis not present

## 2023-12-22 DIAGNOSIS — R809 Proteinuria, unspecified: Secondary | ICD-10-CM

## 2023-12-22 DIAGNOSIS — I952 Hypotension due to drugs: Secondary | ICD-10-CM | POA: Diagnosis not present

## 2023-12-22 DIAGNOSIS — I10 Essential (primary) hypertension: Secondary | ICD-10-CM

## 2023-12-22 DIAGNOSIS — F3341 Major depressive disorder, recurrent, in partial remission: Secondary | ICD-10-CM

## 2023-12-22 NOTE — Patient Instructions (Addendum)
 Lets try a more consistent bedtime daily for at least 2 weeks for a reset  Stay on lower dose of valsartan - blood pressure looks better today and glad you are improving  If not continuing to see me better please see us  back  j Recommended follow up: Return for next already scheduled visit or sooner if needed.

## 2023-12-22 NOTE — Progress Notes (Signed)
 Phone [phone] In person visit   Subjective:   [name] is a 83 y.o. year old very pleasant female patient who presents for/with See problem oriented charting Chief Complaint  Patient presents with   Hospitalization Follow-up    Past Medical History-  Patient Active Problem List   Diagnosis Date Noted   Perineal sinus 03/25/2021    Priority: High   Chronic UTI 02/23/2021    Priority: High   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 11/22/2019    Priority: High   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    Priority: High   Bronchiectasis (HCC) 10/18/2015    Priority: High   Recurrent major depression in full remission (HCC) 01/03/2014    Priority: High   Diabetes mellitus with microalbuminuria (HCC) 12/28/2006    Priority: High   Chronic diastolic heart failure (HCC) 09/02/2021    Priority: Medium    Dyspnea on exertion 02/22/2019    Priority: Medium    Adenomatous polyp 12/20/2018    Priority: Medium    History of colonic polyps 05/18/2017    Priority: Medium    Aortic atherosclerosis (HCC) 02/16/2016    Priority: Medium    Solitary pulmonary nodule 01/08/2016    Priority: Medium    IBS (irritable bowel syndrome) 07/17/2014    Priority: Medium    Essential hypertension 01/03/2014    Priority: Medium    Primary open-angle glaucoma 08/02/2012    Priority: Medium    Fatty liver 03/18/2009    Priority: Medium    Hyperlipidemia 07/26/2007    Priority: Medium    ANEMIA, B12 DEFICIENCY 12/29/2006    Priority: Medium    RESTLESS LEG SYNDROME, MILD 12/28/2006    Priority: Medium    Osteoporosis 12/12/2006    Priority: Medium    Pain due to onychomycosis of toenails of both feet 11/08/2018    Priority: Low   History of total knee replacement, bilateral 11/14/2017    Priority: Low   Arthritis of midfoot 03/03/2016    Priority: Low   Hammertoes of both feet 03/03/2016    Priority: Low   Posterior tibial tendon dysfunction 01/03/2014    Priority: Low    Osteoarthritis, knee 01/03/2014    Priority: Low   Glaucoma 01/03/2014    Priority: Low   Obesity (BMI 30-39.9) 06/15/2013    Priority: Low   Foot tendinitis 07/20/2010    Priority: Low   INSOMNIA, CHRONIC 07/25/2009    Priority: Low   GERD 12/25/2007    Priority: Low   Idiopathic peripheral neuropathy 12/28/2006    Priority: Low   BREAST CANCER, HX OF 12/12/2006    Priority: Low   Abnormal CT scan, colon 08/30/2023   Polyp of transverse colon 08/30/2023   Polyp of rectum 08/30/2023   Chronic cystitis 11/12/2022   Rectal fistula 11/12/2022   Chronic rhinitis 09/14/2022   Right lower quadrant abdominal pain 06/16/2021   Hoarseness 10/17/2020   Physical deconditioning 02/22/2019    Medications- reviewed and updated Current Outpatient Medications  Medication Sig Dispense Refill   albuterol  (VENTOLIN  HFA) 108 (90 Base) MCG/ACT inhaler INHALE 2 PUFFS BY MOUTH EVERY 6 HOURS AS NEEDED FOR WHEEZING OR SHORTNESS OF BREATH 18 g 0   atenolol  (TENORMIN ) 25 MG tablet Take 1 tablet by mouth once daily 90 tablet 0   Bacillus Coagulans-Inulin (ALIGN PREBIOTIC-PROBIOTIC PO) Take 1 capsule by mouth daily.      BD PEN NEEDLE NANO 2ND GEN 32G X 4 MM MISC USE AS DIRECTED DAILY 100  each 0   Blood Glucose Monitoring Suppl (ACCU-CHEK GUIDE) w/Device KIT Use to test blood sugars daily. Dx: E11.9 1 kit 3   cetirizine (ZYRTEC) 10 MG tablet Take 10 mg by mouth every morning.      Cholecalciferol  (VITAMIN D3) 125 MCG (5000 UT) TABS Take 5,000 Units by mouth daily.     Coenzyme Q10 (COQ10) 200 MG CAPS Take 200 mg by mouth daily.     desvenlafaxine  (PRISTIQ ) 50 MG 24 hr tablet Take 1 tablet (50 mg total) by mouth daily. 90 tablet 3   dorzolamide -timolol  (COSOPT ) 22.3-6.8 MG/ML ophthalmic solution Place 1 drop into both eyes 2 (two) times daily.      estradiol  (ESTRING ) 7.5 MCG/24HR vaginal ring Place 1 each vaginally every 3 (three) months. 1 each 3   FIBER PO Take 2 tablets by mouth every evening.      fluticasone  (FLONASE ) 50 MCG/ACT nasal spray Use 1 spray(s) in each nostril once daily 16 g 0   fosfomycin (MONUROL) 3 g PACK Take 3 g by mouth once. Once per month on external instructions- she reports was told once every 10 days per urology     glimepiride  (AMARYL ) 4 MG tablet Take 1 tablet (4 mg total) by mouth daily with breakfast. 180 tablet 3   glucose blood (ACCU-CHEK GUIDE) test strip USE 1 STRIP ONCE DAILY TO  CHECK  GLUCOSE. Dx: E11.9 100 each 3   ipratropium (ATROVENT ) 0.03 % nasal spray Place 2 sprays into both nostrils 2 (two) times daily as needed for rhinitis. 30 mL 5   Lancets Misc. (ACCU-CHEK FASTCLIX LANCET) KIT Use to test blood sugars daily. Dx: E11.9 1 kit 5   latanoprost  (XALATAN ) 0.005 % ophthalmic solution Place 1 drop into the right eye at bedtime.      loperamide  (IMODIUM ) 2 MG capsule Take 2 capsules (4 mg total) by mouth 3 (three) times daily as needed for diarrhea or loose stools. 12 capsule 0   lovastatin  (MEVACOR ) 10 MG tablet Take 1 tablet by mouth once daily 90 tablet 0   methenamine  (HIPREX ) 1 g tablet Take 1 tablet (1 g total) by mouth 2 (two) times daily with a meal. 60 tablet 11   montelukast  (SINGULAIR ) 10 MG tablet TAKE 1 TABLET BY MOUTH AT BEDTIME . APPOINTMENT REQUIRED FOR FUTURE REFILLS 30 tablet 11   Multiple Vitamin (MULTIVITAMIN WITH MINERALS) TABS tablet Take 1 tablet by mouth daily. 3 Fruit Capsules 3 Vegetable Capsules - Balance of Nature 30 tablet 0   MYRBETRIQ 50 MG TB24 tablet Take 1 tablet by mouth once daily 30 tablet 0   OZEMPIC , 1 MG/DOSE, 4 MG/3ML SOPN INJECT 1MG  SUBCUTANEOUSLY ONCE A WEEK 3 mL 0   Selenium 200 MCG CAPS Take 200 mcg by mouth at bedtime.      SYMBICORT  80-4.5 MCG/ACT inhaler Inhale 2 puffs by mouth twice daily 11 g 5   tamsulosin  (FLOMAX ) 0.4 MG CAPS capsule Take 1 capsule by mouth at bedtime 30 capsule 0   temazepam  (RESTORIL ) 15 MG capsule Take 1 capsule (15 mg total) by mouth at bedtime. 30 capsule 5   TRESIBA  FLEXTOUCH 200  UNIT/ML FlexTouch Pen INJECT 80 UNITS SUBCUTANEOUSLY IN THE MORNING 9 mL 0   valsartan  (DIOVAN ) 80 MG tablet Take 1 tablet by mouth once daily 90 tablet 0   vitamin B-12 (CYANOCOBALAMIN ) 1000 MCG tablet Take 1,000 mcg by mouth daily.     fluconazole  (DIFLUCAN ) 150 MG tablet TAKE ONE TABLET BY MOUTH AS A ONE-TIME  DOSE REPEAT  IN  48  HOURS  IF  SYMPTOMS  ARE  NOT  COMPLETELY  RESOLVED (Patient not taking: Reported on 12/22/2023) 2 tablet 0   terconazole  (TERAZOL 7 ) 0.4 % vaginal cream Place 1 applicator vaginally at bedtime. (Patient not taking: Reported on 12/22/2023) 45 g 0   No current facility-administered medications for this visit.     Objective:  BP 128/68 (BP Location: Left Arm, Patient Position: Sitting, Cuff Size: Normal)   Pulse 84   Temp 97.9 F (36.6 C) (Temporal)   Ht 6' (1.829 m)   LMP 05/10/1992 Comment: partial  SpO2 94%   BMI 33.91 kg/m  Gen: NAD, resting comfortably CV: RRR no murmurs rubs or gallops Lungs: CTAB no crackles, wheeze, rhonchi Ext: no edema Skin: warm, dry Neuro: Sitting comfortably in a wheelchair per baseline    Assessment and Plan   # ED follow-up for generalized weakness from hypotension and medication change S: Patient was seen on 12/13/2023 for generalized weakness/fatigue.  In the emergency department.  She felt like her symptoms started a week prior since doubling her dose of losartan.  This has been increased due to microalbuminuria.  She did also have an episode of chest pain and shortness of breath that morning but resolved subsequently.  No headaches, cough, congestion, fevers, chills, Donnell pain, nausea, vomiting, diarrhea or dysuria.  Labs reviewed: CMP showed mildly low calcium  at 8.8 mg/dL, mildly low albumin at 3.4 g/dL but otherwise normal, CBC reassuring, troponin trend flat and low.  She had a chest x-ray without acute disease though aortic atherosclerosis noted.  EKG reported as reassuring.  Urinalysis was hazy with small leukocytes  and rare bacteria but was not having urinary symptoms.  She received a bolus of IV fluid.  They essentially recommended she return to her prior dose of losartan and follow-up with us  in office  Since being home and decreasing dose of losartan back down- seems more mentally clear (previously noted more confusion but still doesn't feel as sharp as prior to dosage adjustment- reports prevagen) -still low energy -feels more depressed even though remains on antidepressant -sleeping more inconsistently. Temazepam  not as helpful since this.  -blood pressure has improved  -making some gradual progress A/P: Generalized weakness/fatigue likely related to medication change and hypotension associted with that as well as hypovolemia- responded well to bolus of fluids.  Patient reports steadily improving as gets further out from emergency department and going back to the lower dose of valsartan  80 mg.  She prefers to continue to monitor.  Blood work largely reassuring so we opted not to repeat at this time.  She does feel that slightly worsened her depression and PHQ-9 was up to 10 today but feels like she is doing better each day so wants to monitor for now    # Diabetes with microalbuminuria S: Medication:Glimepiride  8 mg daily, Ozempic  1 mg, Tresiba  80 units -For microalbuminuria valsartan  80 mg-hypotensive on 160 mg.  Extensive UTI history so not great candidate for Jardiance.  Currently would also lower blood pressure likely so options are limited Lab Results  Component Value Date   HGBA1C 6.8 (H) 11/29/2023   HGBA1C 6.8 (H) 06/14/2023   HGBA1C 7.3 (H) 12/24/2022   A/P: Diabetes well-controlled with A1c of 6.8-continue current medication - Microalbuminuria with mild elevation at 32.9 per ratio-max tolerable dose of valsartan  is 80 mg due to hypotension.  As above not a good candidate for Jardiance or Darice yet either-continue current medication.  At later date we did consider possibly coming off of  atenolol  and retrying maybe valsartan  120 mg or 160 mg   #hypertension S: medication: Atenolol  25 mg daily, valsartan  80 mg daily A/P: Well-controlled-continue current medication no get  #hyperlipidemia S: Medication:Lovastatin  10 mg daily  Lab Results  Component Value Date   CHOL 103 11/29/2023   HDL 38.90 (L) 11/29/2023   LDLCALC 48 11/29/2023   LDLDIRECT 77.0 02/16/2016   TRIG 83.0 11/29/2023   CHOLHDL 3 11/29/2023  A/P: Well-controlled-continue current medication  # Depression S: Medication:Pristiq  50 mg daily A/P: PHQ-9 elevated today at 10 with recent acute stressor-she prefers to continue current medication for now but reach back out if fails to improve   #REcurrent urinary tract infection- avoid jardiance as result. Remains on suppressive therapy  Recommended follow up: Return for next already scheduled visit or sooner if needed. Future Appointments  Date Time Provider Department Center  02/13/2024  2:15 PM Christine Rush, [place]  02/27/2024  1:35 PM Cleotilde Ronal RAMAN, MD [facility]  04/02/2024  3:00 PM Katrinka Garnette KIDD, MD [facility]    Lab/Order associations:   ICD-10-CM   1. Hypotension due to drugs  I95.2     2. Diabetes mellitus with microalbuminuria (HCC)  E11.29    R80.9     3. Essential hypertension  I10     4. Mixed hyperlipidemia  E78.2     5. Recurrent major depression in partial remission (HCC)  F33.41       No orders of the defined types were placed in this encounter.   Return precautions advised.  Garnette Katrinka, MD

## 2023-12-23 ENCOUNTER — Other Ambulatory Visit: Payer: Self-pay | Admitting: Emergency Medicine

## 2023-12-23 DIAGNOSIS — J301 Allergic rhinitis due to pollen: Secondary | ICD-10-CM

## 2023-12-25 ENCOUNTER — Other Ambulatory Visit: Payer: Self-pay | Admitting: Family Medicine

## 2024-01-05 ENCOUNTER — Encounter (HOSPITAL_BASED_OUTPATIENT_CLINIC_OR_DEPARTMENT_OTHER): Payer: Self-pay

## 2024-01-07 ENCOUNTER — Other Ambulatory Visit: Payer: Self-pay | Admitting: Family Medicine

## 2024-01-09 ENCOUNTER — Other Ambulatory Visit: Payer: Self-pay | Admitting: Family Medicine

## 2024-01-26 ENCOUNTER — Other Ambulatory Visit: Payer: Self-pay | Admitting: Family Medicine

## 2024-01-28 ENCOUNTER — Other Ambulatory Visit: Payer: Self-pay | Admitting: Family Medicine

## 2024-01-31 NOTE — Progress Notes (Signed)
 [name]                                          MRN: [id number]   01/31/2024   The [facility] Quality Team Specialist reviewed this patient medical record for the purposes of chart review for care gap closure. The following were reviewed: abstraction for care gap closure-kidney health evaluation for diabetes:eGFR  and uACR.    [facility] Quality Team

## 2024-02-06 ENCOUNTER — Other Ambulatory Visit: Payer: Self-pay | Admitting: Family Medicine

## 2024-02-08 ENCOUNTER — Encounter (HOSPITAL_COMMUNITY): Payer: Self-pay

## 2024-02-08 ENCOUNTER — Ambulatory Visit (HOSPITAL_COMMUNITY)
Admission: EM | Admit: 2024-02-08 | Discharge: 2024-02-08 | Disposition: A | Discharge status: Home / Self Care | Attending: Family Medicine | Admitting: Family Medicine

## 2024-02-08 DIAGNOSIS — M25562 Pain in left knee: Secondary | ICD-10-CM | POA: Diagnosis not present

## 2024-02-08 NOTE — ED Triage Notes (Signed)
 Patient here today with c/o pain behind left knee X 2 weeks. Patient has increased pain with weightbearing.

## 2024-02-11 NOTE — ED Provider Notes (Signed)
 [facility] CARE CENTER   [id number] 02/08/24 Arrival Time: 1302  ASSESSMENT & PLAN:  1. Acute pain of left knee    Reassured. I do not see any signs of DVT. Pain in knee is chronic. History of Bakers cyst and this may be the cause of acute pain. Prefers to continue OTC analgesics. Activities as tolerated.  Recommend:  Follow-up Information     Schedule an appointment as soon as possible for a visit  with Melodi Lerner, MD.   Specialty: Orthopedic Surgery Contact information: [address] [place] [address] [phone]                 Reviewed expectations re: course of current medical issues. Questions answered. Outlined signs and symptoms indicating need for more acute intervention. Patient verbalized understanding. After Visit Summary given.  SUBJECTIVE: History from: patient. [name] is a 83 y.o. female who reports chronic L knee pain. Sometimes behind knee More with bearing wt. Denies swelling in LE. Otherwise well. H/O bilat knee replacement.  Past Surgical History:  Procedure Laterality Date   ADENOIDECTOMY     at age 50   ANAL FISTULOTOMY N/A 03/07/2020   Procedure: ANAL FISTULOTOMY WITH MARSUPIALIZATION;  Surgeon: Sheldon Standing, MD;  Location: [facility];  Service: General;  Laterality: N/A;   BIOPSY  12/19/2018   Procedure: BIOPSY;  Surgeon: Shila Gustav GAILS, MD;  Location: [facility];  Service: Endoscopy;;   CATARACT EXTRACTION     CHOLECYSTECTOMY  06/2008   COLONOSCOPY N/A 08/30/2023   Procedure: COLONOSCOPY;  Surgeon: Shila Gustav GAILS, MD;  Location: [facility];  Service: Gastroenterology;  Laterality: N/A;   COLONOSCOPY WITH PROPOFOL  N/A 05/17/2017   Procedure: COLONOSCOPY WITH PROPOFOL ;  Surgeon: Shila Gustav GAILS, MD;  Location: [facility];  Service: Endoscopy;  Laterality: N/A;  PT WILL BE ADMITTED THE DAY BEFORE FOR PREP PER ROBIN KB   COLONOSCOPY WITH PROPOFOL  N/A 12/19/2018    Procedure: COLONOSCOPY WITH PROPOFOL ;  Surgeon: Shila Gustav GAILS, MD;  Location: [facility];  Service: Endoscopy;  Laterality: N/A;   DILATION AND CURETTAGE OF UTERUS     excision on breast     internal infected suture from breast surgery   excision removed from neck  2008   infected lymph node   EXTRACORPOREAL SHOCK WAVE LITHOTRIPSY Right 07/20/2021   Procedure: EXTRACORPOREAL SHOCK WAVE LITHOTRIPSY (ESWL);  Surgeon: Cam Morene ORN, MD;  Location: [facility];  Service: Urology;  Laterality: Right;   EXTRACORPOREAL SHOCK WAVE LITHOTRIPSY Right 10/15/2021   Procedure: EXTRACORPOREAL SHOCK WAVE LITHOTRIPSY (ESWL);  Surgeon: Elisabeth Valli BIRCH, MD;  Location: [facility];  Service: Urology;  Laterality: Right;   HEMORRHOID SURGERY N/A 11/22/2019   Procedure: HEMORRHOIDECTOMY LIGATION , PEXY;  Surgeon: Sheldon Standing, MD;  Location: [facility];  Service: General;  Laterality: N/A;   HYPERBARIC OXYGEN  THERAPY     FEET INFECTION   HYSTEROSCOPY  06/22/2011   PMB submucosal myoma   JOINT REPLACEMENT  2014   left total shoulder   LAPAROSCOPIC OOPHORECTOMY Right 12/2004   absent LSO   LAPAROTOMY  1977   LIGATION OF INTERNAL FISTULA TRACT N/A 11/22/2019   Procedure: REPAIR OF PERIRECTAL FISTULA, ANORECTAL EXAMINATION UNDER ANESTHESIA;  Surgeon: Sheldon Standing, MD;  Location: [facility];  Service: General;  Laterality: N/A;   MASTECTOMY Bilateral 1994 right, 1995 left   PLACEMENT OF SETON N/A 03/19/2021   Procedure: LIMBERG RHOMBOID ROTATIONAL FLAP CLOSURE;  Surgeon:  Sheldon Standing, MD;  [facility];  Service: General;  Laterality: N/A;   POLYPECTOMY  12/19/2018   Procedure: POLYPECTOMY;  Surgeon: Shila Gustav GAILS, MD;  [facility];  Service: Endoscopy;;   POLYPECTOMY  08/30/2023   Procedure: POLYPECTOMY, INTESTINE;  Surgeon: Shila Gustav GAILS, MD;  Location: [facility];  Service: Gastroenterology;;   RECTAL  EXAM UNDER ANESTHESIA N/A 03/07/2020   Procedure: ANORECTAL EXAM UNDER ANESTHESIA;  Surgeon: Sheldon Standing, MD;  Location: [facility];  Service: General;  Laterality: N/A;   RECTAL EXAM UNDER ANESTHESIA N/A 03/19/2021   Procedure: RECTAL EXAM UNDER ANESTHESIA;  Surgeon: Sheldon Standing, MD;  Location[facility];  Service: General;  Laterality: N/A;   SHOULDER HEMI-ARTHROPLASTY  01/01/2012   Procedure: SHOULDER HEMI-ARTHROPLASTY;  Surgeon: Elsie Mussel, MD;  [facility];  Service: Orthopedics;  Laterality: Left;  Left Shoulder Hemi Arthroplasty with Repair and Reconstruction as Necessary    THYROIDECTOMY  1975   85yrs ago. follows endocrine   TONSILLECTOMY     as a child   TOTAL KNEE ARTHROPLASTY Right 12/30/2014   Procedure: RIGHT TOTAL KNEE ARTHROPLASTY;  Surgeon: Dempsey Moan, MD;  Location: [facility];  Service: Orthopedics;  Laterality: Right;   TOTAL KNEE ARTHROPLASTY Left 11/29/2016   Procedure: LEFT TOTAL KNEE ARTHROPLASTY;  Surgeon: Moan Dempsey, MD;  Location: [facility];  Service: Orthopedics;  Laterality: Left;      OBJECTIVE:  Vitals:   02/08/24 1514  BP: 115/73  Pulse: 74  Resp: 16  Temp: 98.2 F (36.8 C)  TempSrc: Oral  SpO2: 92%    General appearance: alert; no distress Extremities: LLE: warm with well perfused appearance; healed surgical scar; no knee or calf swelling; ques some fullness of posterior knee; FROM; decreased distal sensation but this is her baseline; 1+ DP pulse Psychological: alert and cooperative; normal mood and affect      Allergies  Allergen Reactions   Nitrofurantoin Other (See Comments)    Severe headache HEADACHE   Levaquin [Levofloxacin In D5w] Other (See Comments)    Pain in tendons    Brimonidine Other (See Comments)    Made eyes and surrounding areas RED/ was an eye drop   Cephalexin  Diarrhea and Other (See Comments)    Patient can't remember reaction (per chart at [facility] states diarrhea) (tolerates Augmentin  fine)    Erythromycin Ethylsuccinate Hives and Diarrhea   Oxycodone  Itching    Past Medical History:  Diagnosis Date   Allergy    Anemia    Anxiety    Arthritis    right knee;injections every 6months    Asthma    use daily   Breast cancer (HCC) 1994/1995   HX BREAST CANCER/ right brreast and left breast in 1995   Bronchiectasis (HCC)    COMPRESSION FRACTURE, LUMBAR VERTEBRAE 08/21/2008   Qualifier: Diagnosis of  By: Mavis MD, Norleen BRAVO    Depression    Diabetes mellitus    takes Amaryl  and Januvia  daily   Diverticulitis    Dyspnea    with activity   Early cataracts, bilateral    Eczema    Endometriosis    Fatty liver    Gastritis    Glaucoma    Hammer toe    History of kidney stones    Hyperlipidemia associated with type 2 diabetes mellitus (HCC) 07/26/2007   Diet/exercise control.     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   Thyroid  disease    Vitamin D  deficiency    takes Vit d daily   Weakness    uses a walker and wheelchair   Social History   Socioeconomic History   Marital status: Married    Spouse name: Signe   Number of children: 1   Years of education: Not on file   Highest education level: Not on file  Occupational History   Occupation: retired  Tobacco Use   Smoking status: Never   Smokeless tobacco: Never  Vaping Use   Vaping status: Never Used  Substance and  Sexual Activity   Alcohol use: No   Drug use: No   Sexual activity: Not Currently    Partners: Male    Birth control/protection: Post-menopausal, Surgical    Comment: partial hysterectomy  Other Topics Concern   Not on file  Social History Narrative   Married 39 years in 2015. 1 adopted son. 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 : Never true  Transportation Needs: No Transportation Needs (07/19/2023)   Received from [employer]    In the past 12 months, has lack of reliable transportation kept you from medical appointments, meetings, work or from getting things needed for daily living? : No  Physical Activity: Insufficiently Active (05/14/2022)   Exercise Vital Sign    Days of Exercise per Week: 5 days    Minutes of Exercise per Session: 10 min  Stress: No Stress Concern Present (05/14/2022)   [employer] of Occupational Health - Occupational Stress Questionnaire    Feeling of Stress : Only a little  Social Connections: Moderately Integrated (05/14/2022)   Social Connection and Isolation Panel    Frequency of Communication with Friends and Family: Three times a week    Frequency of Social Gatherings with Friends and Family: Once a week    Attends Religious Services: 1 to 4 times per year    Active Member of [employer] or Organizations: No    Attends Banker Meetings: Never    Marital Status:  Married   Family History  Problem Relation Age  of Onset   Arthritis Mother    COPD Father    Heart disease Father        MI 83 - states he died of lockjaw   Hypertension Father    Hyperlipidemia Father    Pancreatic cancer Paternal Grandfather    Colon cancer Neg Hx    Esophageal cancer Neg Hx    Rectal cancer Neg Hx    Stomach cancer Neg Hx    Past Surgical History:  Procedure Laterality Date   ADENOIDECTOMY     at age 25   ANAL FISTULOTOMY N/A 03/07/2020   Procedure: ANAL FISTULOTOMY WITH MARSUPIALIZATION;  Surgeon: Sheldon Standing, MD;  Location: [facility];  Service: General;  Laterality: N/A;   BIOPSY  12/19/2018   Procedure: BIOPSY;  Surgeon: Shila Gustav GAILS, MD;  [facility];  Service: Endoscopy;;   CATARACT EXTRACTION     CHOLECYSTECTOMY  06/2008   COLONOSCOPY N/A 08/30/2023   Procedure: COLONOSCOPY;  Surgeon: Shila Gustav GAILS, MD;  Location: [facility];  Service: Gastroenterology;  Laterality: N/A;   COLONOSCOPY WITH PROPOFOL  N/A 05/17/2017   Procedure: COLONOSCOPY WITH PROPOFOL ;  Surgeon: Shila Gustav GAILS, MD;  [facility];  Service: Endoscopy;  Laterality: N/A;  PT WILL BE ADMITTED THE DAY BEFORE FOR PREP PER ROBIN KB   COLONOSCOPY WITH PROPOFOL  N/A 12/19/2018   Procedure: COLONOSCOPY WITH PROPOFOL ;  Surgeon: Shila Gustav GAILS, MD;  [facility];  Service: Endoscopy;  Laterality: N/A;   DILATION AND CURETTAGE OF UTERUS     excision on breast     internal infected suture from breast surgery   excision removed from neck  2008   infected lymph node   EXTRACORPOREAL SHOCK WAVE LITHOTRIPSY Right 07/20/2021   Procedure: EXTRACORPOREAL SHOCK WAVE LITHOTRIPSY (ESWL);  Surgeon: Cam Morene ORN, MD;  Location[facility];  Service: Urology;  Laterality: Right;   EXTRACORPOREAL SHOCK WAVE LITHOTRIPSY Right 10/15/2021   Procedure: EXTRACORPOREAL SHOCK WAVE LITHOTRIPSY (ESWL);  Surgeon: Elisabeth Valli BIRCH,  MD;  Location: [facility];  Service: Urology;  Laterality: Right;   HEMORRHOID SURGERY N/A 11/22/2019   Procedure: HEMORRHOIDECTOMY LIGATION , PEXY;  Surgeon: Sheldon Standing, MD;  Location: [facility];  Service: General;  Laterality: N/A;   HYPERBARIC OXYGEN  THERAPY     FEET INFECTION   HYSTEROSCOPY  06/22/2011   PMB submucosal myoma   JOINT REPLACEMENT  2014   left total shoulder   LAPAROSCOPIC OOPHORECTOMY Right 12/2004   absent LSO   LAPAROTOMY  1977   LIGATION OF INTERNAL FISTULA TRACT N/A 11/22/2019   Procedure: REPAIR OF PERIRECTAL FISTULA, ANORECTAL EXAMINATION UNDER ANESTHESIA;  Surgeon: Sheldon Standing, MD;  Location: [facility];  Service: General;  Laterality: N/A;   MASTECTOMY Bilateral 1994 right, 1995 left   PLACEMENT OF SETON N/A 03/19/2021   Procedure: LIMBERG RHOMBOID ROTATIONAL FLAP CLOSURE;  Surgeon: Sheldon Standing, MD;  [facility];  Service: General;  Laterality: N/A;   POLYPECTOMY  12/19/2018   Procedure: POLYPECTOMY;  Surgeon: Shila Gustav GAILS, MD;  [facility];  Service: Endoscopy;;   POLYPECTOMY  08/30/2023   Procedure: POLYPECTOMY, INTESTINE;  Surgeon: Shila Gustav GAILS, MD;  Location: [facility];  Service: Gastroenterology;;   RECTAL EXAM UNDER ANESTHESIA N/A 03/07/2020   Procedure: ANORECTAL EXAM UNDER ANESTHESIA;  Surgeon: Sheldon Standing, MD;  Location[facility];  Service: General;  Laterality: N/A;   RECTAL EXAM UNDER ANESTHESIA N/A 03/19/2021  Procedure: RECTAL EXAM UNDER ANESTHESIA;  Surgeon: Sheldon Standing, MD;  [facility];  Service: General;  Laterality: N/A;   SHOULDER HEMI-ARTHROPLASTY  01/01/2012   Procedure: SHOULDER HEMI-ARTHROPLASTY;  Surgeon: Elsie Mussel, MD;  Location: [facility];  Service: Orthopedics;  Laterality: Left;  Left Shoulder Hemi Arthroplasty with Repair and Reconstruction as Necessary    THYROIDECTOMY  1975   64yrs ago. follows endocrine    TONSILLECTOMY     as a child   TOTAL KNEE ARTHROPLASTY Right 12/30/2014   Procedure: RIGHT TOTAL KNEE ARTHROPLASTY;  Surgeon: Dempsey Moan, MD;  [facility];  Service: Orthopedics;  Laterality: Right;   TOTAL KNEE ARTHROPLASTY Left 11/29/2016   Procedure: LEFT TOTAL KNEE ARTHROPLASTY;  Surgeon: Moan Dempsey, MD;  [facility];  Service: Orthopedics;  Laterality: Left;       Rolinda Rogue, MD 02/11/24 1229

## 2024-02-13 ENCOUNTER — Ambulatory Visit: Admitting: Podiatry

## 2024-02-17 DIAGNOSIS — Z96652 Presence of left artificial knee joint: Secondary | ICD-10-CM | POA: Diagnosis not present

## 2024-02-17 DIAGNOSIS — G5793 Unspecified mononeuropathy of bilateral lower limbs: Secondary | ICD-10-CM | POA: Diagnosis not present

## 2024-02-19 ENCOUNTER — Other Ambulatory Visit: Payer: Self-pay | Admitting: Family Medicine

## 2024-02-21 ENCOUNTER — Other Ambulatory Visit (HOSPITAL_BASED_OUTPATIENT_CLINIC_OR_DEPARTMENT_OTHER): Payer: Self-pay | Admitting: Obstetrics & Gynecology

## 2024-02-21 ENCOUNTER — Other Ambulatory Visit: Payer: Self-pay | Admitting: Family Medicine

## 2024-02-21 DIAGNOSIS — N952 Postmenopausal atrophic vaginitis: Secondary | ICD-10-CM

## 2024-02-27 ENCOUNTER — Ambulatory Visit (HOSPITAL_BASED_OUTPATIENT_CLINIC_OR_DEPARTMENT_OTHER): Admitting: Obstetrics & Gynecology

## 2024-02-28 ENCOUNTER — Ambulatory Visit (HOSPITAL_BASED_OUTPATIENT_CLINIC_OR_DEPARTMENT_OTHER): Admitting: Obstetrics & Gynecology

## 2024-03-02 DIAGNOSIS — H401112 Primary open-angle glaucoma, right eye, moderate stage: Secondary | ICD-10-CM | POA: Diagnosis not present

## 2024-03-02 DIAGNOSIS — H04123 Dry eye syndrome of bilateral lacrimal glands: Secondary | ICD-10-CM | POA: Diagnosis not present

## 2024-03-02 DIAGNOSIS — E119 Type 2 diabetes mellitus without complications: Secondary | ICD-10-CM | POA: Diagnosis not present

## 2024-03-02 DIAGNOSIS — H401123 Primary open-angle glaucoma, left eye, severe stage: Secondary | ICD-10-CM | POA: Diagnosis not present

## 2024-03-05 ENCOUNTER — Other Ambulatory Visit: Payer: Self-pay | Admitting: Family Medicine

## 2024-03-05 ENCOUNTER — Ambulatory Visit (HOSPITAL_BASED_OUTPATIENT_CLINIC_OR_DEPARTMENT_OTHER): Admitting: Obstetrics & Gynecology

## 2024-03-05 NOTE — Progress Notes (Deleted)
 GYNECOLOGY  VISIT  CC:   No chief complaint on file.   HPI: 83 y.o. G0P0 Married White or Caucasian female here for Estring  check.  Patient's last menstrual period was 05/10/1992.  Past Medical History:  Diagnosis Date   Allergy    Anemia    Anxiety    Arthritis    right knee;injections every 6months    Asthma    use daily   Breast cancer (HCC) 1994/1995   HX BREAST CANCER/ right brreast and left breast in 1995   Bronchiectasis (HCC)    COMPRESSION FRACTURE, LUMBAR VERTEBRAE 08/21/2008   Qualifier: Diagnosis of  By: Mavis MD, Norleen BRAVO    Depression    Diabetes mellitus    takes Amaryl  and Januvia  daily   Diverticulitis    Dyspnea    with activity   Early cataracts, bilateral    Eczema    Endometriosis    Fatty liver    Gastritis    Glaucoma    Hammer toe    History of kidney stones    Hyperlipidemia associated with type 2 diabetes mellitus (HCC) 07/26/2007   Diet/exercise control.     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   Thyroid  disease    Vitamin D  deficiency    takes Vit d daily   Weakness    uses a walker and wheelchair    MEDS:  Reviewed in EPIC  ALLERGIES: Nitrofurantoin, Levaquin [levofloxacin in d5w], Brimonidine, Cephalexin , Erythromycin ethylsuccinate, and Oxycodone   SH:  ***  ROS  PHYSICAL EXAMINATION:    LMP 05/10/1992 Comment: partial     General appearance: alert, cooperative and appears stated age Neck: no adenopathy, supple, symmetrical, trachea midline and thyroid  {CHL AMB PHY EX THYROID  NORM DEFAULT:[phone]::normal to inspection and palpation} CV:  {Exam; heart brief:31539} Lungs:  {pe lungs ob:314451} Breasts: {Exam; breast:13139::normal appearance, no masses or tenderness} Abdomen: soft, non-tender; bowel sounds normal; no masses,  no organomegaly Lymph:  no inguinal LAD noted  Pelvic: External genitalia:  no lesions              Urethra:  normal appearing urethra with no masses, tenderness or lesions              Bartholins and Skenes: normal                 Vagina: {exam; pelvic vaginal:30846}              Cervix: {CHL AMB PHY EX CERVIX NORM [phone]::no lesions}              Bimanual Exam:  Uterus:  {CHL AMB PHY EX UTERUS NORM DEFAULT:[phone]::normal size, contour, position, consistency, mobility, non-tender}  Adnexa: {CHL AMB PHY EX ADNEXA NO MASS [phone]::no mass, fullness, tenderness}              Rectovaginal: {yes no:[id number]}.  Confirms.              Anus:  normal sphincter tone, no lesions  Chaperone was present for exam.  Assessment/Plan: There are no diagnoses linked to this encounter.

## 2024-03-06 ENCOUNTER — Ambulatory Visit: Admitting: Podiatry

## 2024-03-06 LAB — OPHTHALMOLOGY REPORT-SCANNED

## 2024-03-07 ENCOUNTER — Ambulatory Visit (HOSPITAL_BASED_OUTPATIENT_CLINIC_OR_DEPARTMENT_OTHER): Admitting: Obstetrics & Gynecology

## 2024-03-10 ENCOUNTER — Other Ambulatory Visit: Payer: Self-pay | Admitting: Family Medicine

## 2024-03-13 ENCOUNTER — Ambulatory Visit: Admitting: Podiatry

## 2024-03-13 DIAGNOSIS — B351 Tinea unguium: Secondary | ICD-10-CM

## 2024-03-13 DIAGNOSIS — M79672 Pain in left foot: Secondary | ICD-10-CM | POA: Diagnosis not present

## 2024-03-13 DIAGNOSIS — M79671 Pain in right foot: Secondary | ICD-10-CM | POA: Diagnosis not present

## 2024-03-13 NOTE — Progress Notes (Signed)
 Patient presents for evaluation and treatment of tenderness and some redness around nails feet.  Tenderness around toes with walking and wearing shoes.  Physical exam:  General appearance: Alert, pleasant, and in no acute distress.  Vascular: Pedal pulses: DP 2/4 B/L, PT 0/4 B/L. Moderate edema lower legs bilaterally  Neurologic:  Dermatologic:  Nails thickened, disfigured, discolored 1-5 BL with subungual debris.  Redness and hypertrophic nail folds along nail folds bilaterally but no signs of drainage or infection.  Musculoskeletal:     Diagnosis: 1. Painful onychomycotic nails 1 through 5 bilaterally. 2. Pain toes 1 through 5 bilaterally.  Plan: -Debrided onychomycotic nails 1 through 5 bilaterally.  Sharply debrided nails with nail clipper and reduced with a power bur.  Return 3 months [facility]

## 2024-03-31 ENCOUNTER — Other Ambulatory Visit: Payer: Self-pay | Admitting: Family Medicine

## 2024-04-02 ENCOUNTER — Other Ambulatory Visit: Payer: Self-pay | Admitting: Family Medicine

## 2024-04-02 ENCOUNTER — Encounter: Payer: Self-pay | Admitting: Family Medicine

## 2024-04-02 ENCOUNTER — Ambulatory Visit: Admitting: Family Medicine

## 2024-04-02 VITALS — BP 122/70 | HR 79 | Temp 97.2°F | Ht 72.0 in

## 2024-04-02 DIAGNOSIS — I10 Essential (primary) hypertension: Secondary | ICD-10-CM | POA: Diagnosis not present

## 2024-04-02 DIAGNOSIS — Z7985 Long-term (current) use of injectable non-insulin antidiabetic drugs: Secondary | ICD-10-CM

## 2024-04-02 DIAGNOSIS — Z794 Long term (current) use of insulin: Secondary | ICD-10-CM

## 2024-04-02 DIAGNOSIS — E782 Mixed hyperlipidemia: Secondary | ICD-10-CM | POA: Diagnosis not present

## 2024-04-02 DIAGNOSIS — Z7984 Long term (current) use of oral hypoglycemic drugs: Secondary | ICD-10-CM | POA: Diagnosis not present

## 2024-04-02 DIAGNOSIS — E1129 Type 2 diabetes mellitus with other diabetic kidney complication: Secondary | ICD-10-CM | POA: Diagnosis not present

## 2024-04-02 DIAGNOSIS — R809 Proteinuria, unspecified: Secondary | ICD-10-CM

## 2024-04-02 NOTE — Patient Instructions (Addendum)
 Please stop by lab before you go If you have mychart- we will send your results within 3 business days of us  receiving them.  If you do not have mychart- we will call you about results within 5 business days of us  receiving them.  *please also note that you will see labs on mychart as soon as they post. I will later go in and write notes on them- will say notes from Dr. Katrinka   No changes today unless labs lead us  to make changes  We are considering stopping atenolol  and increasing valsartan  to 120 mg (would be 1.5 tablets of a new 80 mg pill)   Recommended follow up: Return in about 14 weeks (around 07/09/2024) for followup or sooner if needed.Schedule b4 you leave.

## 2024-04-02 NOTE — Progress Notes (Signed)
 Phone [phone] In person visit   Subjective:   [name] is a 83 y.o. year old very pleasant female patient who presents for/with See problem oriented charting Chief Complaint  Patient presents with   Diabetes    Patient states her urine is frothy and bubbly; pt would like bp meds to be changed;    Hypotension    Past Medical History-  Patient Active Problem List   Diagnosis Date Noted   Perineal sinus 03/25/2021    Priority: High   Chronic UTI 02/23/2021    Priority: High   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 11/22/2019    Priority: High   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    Priority: High   Bronchiectasis (HCC) 10/18/2015    Priority: High   Recurrent major depression in full remission 01/03/2014    Priority: High   Diabetes mellitus with microalbuminuria (HCC) 12/28/2006    Priority: High   Chronic diastolic heart failure (HCC) 09/02/2021    Priority: Medium    Dyspnea on exertion 02/22/2019    Priority: Medium    Adenomatous polyp 12/20/2018    Priority: Medium    History of colonic polyps 05/18/2017    Priority: Medium    Aortic atherosclerosis 02/16/2016    Priority: Medium    Solitary pulmonary nodule 01/08/2016    Priority: Medium    IBS (irritable bowel syndrome) 07/17/2014    Priority: Medium    Essential hypertension 01/03/2014    Priority: Medium    Primary open-angle glaucoma 08/02/2012    Priority: Medium    Fatty liver 03/18/2009    Priority: Medium    Hyperlipidemia 07/26/2007    Priority: Medium    ANEMIA, B12 DEFICIENCY 12/29/2006    Priority: Medium    RESTLESS LEG SYNDROME, MILD 12/28/2006    Priority: Medium    Osteoporosis 12/12/2006    Priority: Medium    Pain due to onychomycosis of toenails of both feet 11/08/2018    Priority: Low   History of total knee replacement, bilateral 11/14/2017    Priority: Low   Arthritis of midfoot 03/03/2016    Priority: Low   Hammertoes of both feet 03/03/2016     Priority: Low   Posterior tibial tendon dysfunction 01/03/2014    Priority: Low   Osteoarthritis, knee 01/03/2014    Priority: Low   Glaucoma 01/03/2014    Priority: Low   Obesity (BMI 30-39.9) 06/15/2013    Priority: Low   Foot tendinitis 07/20/2010    Priority: Low   INSOMNIA, CHRONIC 07/25/2009    Priority: Low   GERD 12/25/2007    Priority: Low   Idiopathic peripheral neuropathy 12/28/2006    Priority: Low   BREAST CANCER, HX OF 12/12/2006    Priority: Low   Abnormal CT scan, colon 08/30/2023   Polyp of transverse colon 08/30/2023   Polyp of rectum 08/30/2023   Chronic cystitis 11/12/2022   Rectal fistula 11/12/2022   Chronic rhinitis 09/14/2022   Right lower quadrant abdominal pain 06/16/2021   Hoarseness 10/17/2020   Physical deconditioning 02/22/2019    Medications- reviewed and updated Current Outpatient Medications  Medication Sig Dispense Refill   ACCU-CHEK GUIDE TEST test strip USE 1 STRIP ONCE DAILY TO CHECK GLUCOSE 100 each 0   albuterol  (VENTOLIN  HFA) 108 (90 Base) MCG/ACT inhaler INHALE 2 PUFFS BY MOUTH EVERY 6 HOURS AS NEEDED FOR WHEEZING OR SHORTNESS OF BREATH 18 g 0   atenolol  (TENORMIN ) 25 MG tablet TAKE 1 TABLET BY MOUTH ONCE  DAILY . APPOINTMENT REQUIRED FOR FUTURE REFILLS 90 tablet 0   Bacillus Coagulans-Inulin (ALIGN PREBIOTIC-PROBIOTIC PO) Take 1 capsule by mouth daily.      Blood Glucose Monitoring Suppl (ACCU-CHEK GUIDE) w/Device KIT Use to test blood sugars daily. Dx: E11.9 1 kit 3   CAPVAXIVE 0.5 ML injection      cetirizine (ZYRTEC) 10 MG tablet Take 10 mg by mouth every morning.      Cholecalciferol  (VITAMIN D3) 125 MCG (5000 UT) TABS Take 5,000 Units by mouth daily.     Coenzyme Q10 (COQ10) 200 MG CAPS Take 200 mg by mouth daily.     desvenlafaxine  (PRISTIQ ) 50 MG 24 hr tablet Take 1 tablet (50 mg total) by mouth daily. 90 tablet 3   dorzolamide -timolol  (COSOPT ) 22.3-6.8 MG/ML ophthalmic solution Place 1 drop into both eyes 2 (two) times daily.       EMBECTA PEN NEEDLE NANO 2 GEN 32G X 4 MM MISC USE AS DIRECTED DAILY 100 each 0   ESTRING  7.5 MCG/24HR vaginal ring INSERT 1 RING VAGINALLY EVERY 3 MONTHS 1 each 0   FIBER PO Take 2 tablets by mouth every evening.     fluticasone  (FLONASE ) 50 MCG/ACT nasal spray Use 1 spray(s) in each nostril once daily 16 g 2   fosfomycin (MONUROL) 3 g PACK Take 3 g by mouth once. Once per month on external instructions- she reports was told once every 10 days per urology     glimepiride  (AMARYL ) 4 MG tablet Take 1 tablet (4 mg total) by mouth daily with breakfast. 180 tablet 3   ipratropium (ATROVENT ) 0.03 % nasal spray Place 2 sprays into both nostrils 2 (two) times daily as needed for rhinitis. 30 mL 5   Lancets Misc. (ACCU-CHEK FASTCLIX LANCET) KIT Use to test blood sugars daily. Dx: E11.9 1 kit 5   latanoprost  (XALATAN ) 0.005 % ophthalmic solution Place 1 drop into the right eye at bedtime.      loperamide  (IMODIUM ) 2 MG capsule Take 2 capsules (4 mg total) by mouth 3 (three) times daily as needed for diarrhea or loose stools. 12 capsule 0   lovastatin  (MEVACOR ) 10 MG tablet Take 1 tablet by mouth once daily 90 tablet 0   methenamine  (HIPREX ) 1 g tablet Take 1 tablet (1 g total) by mouth 2 (two) times daily with a meal. 60 tablet 11   montelukast  (SINGULAIR ) 10 MG tablet TAKE 1 TABLET BY MOUTH AT BEDTIME . APPOINTMENT REQUIRED FOR FUTURE REFILLS 30 tablet 11   Multiple Vitamin (MULTIVITAMIN WITH MINERALS) TABS tablet Take 1 tablet by mouth daily. 3 Fruit Capsules 3 Vegetable Capsules - Balance of Nature 30 tablet 0   MYRBETRIQ 50 MG TB24 tablet Take 1 tablet by mouth once daily 30 tablet 0   OZEMPIC , 1 MG/DOSE, 4 MG/3ML SOPN INJECT 1MG   SUBCUTANEOUSLY ONCE A WEEK 3 mL 0   Selenium 200 MCG CAPS Take 200 mcg by mouth at bedtime.      SYMBICORT  80-4.5 MCG/ACT inhaler Inhale 2 puffs by mouth twice daily 11 g 5   temazepam  (RESTORIL ) 15 MG capsule Take 1 capsule (15 mg total) by mouth at bedtime. 30 capsule 5    terconazole  (TERAZOL 7 ) 0.4 % vaginal cream Place 1 applicator vaginally at bedtime. 45 g 0   TRESIBA  FLEXTOUCH 200 UNIT/ML FlexTouch Pen INJECT 80 UNITS SUBCUTANEOUSLY IN THE MORNING 9 mL 0   valsartan  (DIOVAN ) 80 MG tablet Take 1 tablet by mouth once daily 90 tablet 0  vitamin B-12 (CYANOCOBALAMIN ) 1000 MCG tablet Take 1,000 mcg by mouth daily.     VYZULTA 0.024 % SOLN Place 1 drop into the left eye at bedtime.     fluconazole  (DIFLUCAN ) 150 MG tablet TAKE ONE TABLET BY MOUTH AS A ONE-TIME DOSE REPEAT  IN  48  HOURS  IF  SYMPTOMS  ARE  NOT  COMPLETELY  RESOLVED (Patient not taking: Reported on 04/02/2024) 2 tablet 0   No current facility-administered medications for this visit.     Objective:  BP 122/70 (BP Location: Left Arm, Patient Position: Sitting, Cuff Size: Normal)   Pulse 79   Temp (!) 97.2 F (36.2 C) (Temporal)   Ht 6' (1.829 m)   LMP 05/10/1992 Comment: partial  SpO2 92%   BMI 33.91 kg/m  Gen: NAD, resting comfortably CV: RRR no murmurs rubs or gallops Lungs: CTAB no crackles, wheeze, rhonchi Ext: no edema Skin: warm, dry     Assessment and Plan    # Diabetes with microalbuminuria S: Medication:Glimepiride  8 mg daily, Ozempic  1 mg, Tresiba  80 units -For microalbuminuria valsartan  80 mg-hypotensive on 160 mg.  Extensive UTI history so not great candidate for Jardiance.  Currently would also lower blood pressure likely so options are limited -under 120 in morning She has been doing some reading and after this noted foamy urine and made her more concerned- not sure how long that's been present and could have been some time.  Lab Results  Component Value Date   HGBA1C 6.8 (H) 11/29/2023   HGBA1C 6.8 (H) 06/14/2023   HGBA1C 7.3 (H) 12/24/2022   A/P: diabetes well controlled in past- update a1c with labs- continue current medications for now   For microalbuminuria since she's noting foamy urine- update UA and UACR today- likely continue current medications but  did discuss potentially stopping atenolol  and trying valsartan  120 mg (was hypotensive on 160 mg in past so want to be cautious)   #hypertension S: medication: Atenolol  25 mg daily, valsartan  80 mg daily- half of 160 mg dose BP Readings from Last 3 Encounters:  04/02/24 122/70  02/08/24 115/73  12/22/23 128/68  A/P: blood pressure well controlled continue current medications although considering stopping atenolol  for space for valsartan   #hyperlipidemia # Aortic atherosclerosis S: Medication: Lovastatin  10 mg daily  Lab Results  Component Value Date   CHOL 103 11/29/2023   HDL 38.90 (L) 11/29/2023   LDLCALC 48 11/29/2023   LDLDIRECT 77.0 02/16/2016   TRIG 83.0 11/29/2023   CHOLHDL 3 11/29/2023  A/P: very well controlled last visit continue current medications   # Bronchiectasis-follows with pulmonary-on Symbicort  80-4.5 mg 2 puffs twice daily -singulair  available as needed from pulmonary in past - not needing -doing well continue current medications   # Restless legs- no recent issues still   # Depression S: Medication:Pristiq  50 mg daily    04/02/2024    3:05 PM 12/22/2023    1:31 PM 11/29/2023    2:21 PM  Depression screen PHQ 2/9  Decreased Interest 2 0 0  Down, Depressed, Hopeless 2 3 0  PHQ - 2 Score 4 3 0  Altered sleeping 2 3 0  Tired, decreased energy 2 3 0  Change in appetite 1 1 0  Feeling bad or failure about yourself  0 0 0  Trouble concentrating 0 0 0  Moving slowly or fidgety/restless 0 0 0  Suicidal thoughts 0 0 0  PHQ-9 Score 9 10  0   Difficult doing work/chores Somewhat difficult  Not difficult at all Not difficult at all     Data saved with a previous flowsheet row definition  A/P: she reports being worried about kidneys has been big prescribed as requested tof this with foamy urine- after going over kidneyfailurerisk she is feeling ebtter    # Recurrent UTI-on methenamine  1 g twice daily  -fosfomycin per urology every 10 days as well 3 g- no  recent issues  Recommended follow up: No follow-ups on file. Future Appointments  Date Time Provider Department Center  04/11/2024  2:55 PM Cleotilde Ronal RAMAN, MD [facility] [phone] [facility]    Lab/Order associations:   ICD-10-CM   1. Diabetes mellitus with microalbuminuria (HCC)  E11.29 Urinalysis, Routine w reflex microscopic   R80.9 Microalbumin / creatinine urine ratio    Comprehensive metabolic panel with GFR    Hemoglobin A1c    2. Mixed hyperlipidemia  E78.2 Comprehensive metabolic panel with GFR    3. Essential hypertension  I10 Comprehensive metabolic panel with GFR      No orders of the defined types were placed in this encounter.   Return precautions advised.  Garnette Lukes, MD

## 2024-04-03 ENCOUNTER — Ambulatory Visit: Payer: Self-pay | Admitting: Family Medicine

## 2024-04-03 LAB — URINALYSIS, ROUTINE W REFLEX MICROSCOPIC
Bilirubin Urine: NEGATIVE
Hgb urine dipstick: NEGATIVE
Ketones, ur: NEGATIVE
Leukocytes,Ua: NEGATIVE
Nitrite: NEGATIVE
RBC / HPF: NONE SEEN (ref 0–?)
Specific Gravity, Urine: 1.015 (ref 1.000–1.030)
Urine Glucose: NEGATIVE
Urobilinogen, UA: 0.2 (ref 0.0–1.0)
pH: 6.5 (ref 5.0–8.0)

## 2024-04-03 LAB — COMPREHENSIVE METABOLIC PANEL WITH GFR
ALT: 11 U/L (ref 0–35)
AST: 12 U/L (ref 0–37)
Albumin: 4 g/dL (ref 3.5–5.2)
Alkaline Phosphatase: 55 U/L (ref 39–117)
BUN: 17 mg/dL (ref 6–23)
CO2: 34 meq/L — ABNORMAL HIGH (ref 19–32)
Calcium: 9.4 mg/dL (ref 8.4–10.5)
Chloride: 102 meq/L (ref 96–112)
Creatinine, Ser: 0.65 mg/dL (ref 0.40–1.20)
GFR: 80.96 mL/min (ref >60.00)
Glucose, Bld: 105 mg/dL — ABNORMAL HIGH (ref 70–99)
Potassium: 4.7 meq/L (ref 3.5–5.1)
Sodium: 142 meq/L (ref 135–145)
Total Bilirubin: 0.5 mg/dL (ref 0.2–1.2)
Total Protein: 6.3 g/dL (ref 6.0–8.3)

## 2024-04-03 LAB — MICROALBUMIN / CREATININE URINE RATIO
Creatinine,U: 118 mg/dL
Microalb Creat Ratio: 33.4 mg/g — ABNORMAL HIGH (ref 0.0–30.0)
Microalb, Ur: 3.9 mg/dL — ABNORMAL HIGH (ref 0.0–1.9)

## 2024-04-03 LAB — HEMOGLOBIN A1C: Hgb A1c MFr Bld: 6.4 % (ref 4.6–6.5)

## 2024-04-11 ENCOUNTER — Ambulatory Visit (HOSPITAL_BASED_OUTPATIENT_CLINIC_OR_DEPARTMENT_OTHER): Admitting: Obstetrics & Gynecology

## 2024-04-11 ENCOUNTER — Encounter (HOSPITAL_BASED_OUTPATIENT_CLINIC_OR_DEPARTMENT_OTHER): Payer: Self-pay | Admitting: Obstetrics & Gynecology

## 2024-04-11 DIAGNOSIS — Z4689 Encounter for fitting and adjustment of other specified devices: Secondary | ICD-10-CM

## 2024-04-11 DIAGNOSIS — N952 Postmenopausal atrophic vaginitis: Secondary | ICD-10-CM

## 2024-04-11 MED ORDER — ESTRING 7.5 MCG/24HR VA RING: 1.0000 | VAGINAL_RING | VAGINAL | 3 refills | Status: AC

## 2024-04-11 NOTE — Progress Notes (Unsigned)
 GYNECOLOGY  VISIT  CC:   No chief complaint on file.   HPI: 83 y.o. G0P0 Married White or Caucasian female here for Estring  removal and replacement.  Patient's last menstrual period was 05/10/1992.  Past Medical History:  Diagnosis Date   Allergy    Anemia    Anxiety    Arthritis    right knee;injections every 6months    Asthma    use daily   Breast cancer (HCC) 1994/1995   HX BREAST CANCER/ right brreast and left breast in 1995   Bronchiectasis (HCC)    COMPRESSION FRACTURE, LUMBAR VERTEBRAE 08/21/2008   Qualifier: Diagnosis of  By: Mavis MD, Norleen BRAVO    Depression    Diabetes mellitus    takes Amaryl  and Januvia  daily   Diverticulitis    Dyspnea    with activity   Early cataracts, bilateral    Eczema    Endometriosis    Fatty liver    Gastritis    Glaucoma    Hammer toe    History of kidney stones    Hyperlipidemia associated with type 2 diabetes mellitus (HCC) 07/26/2007   Diet/exercise control.     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   Thyroid  disease    Vitamin D  deficiency    takes Vit d daily   Weakness    uses a walker and wheelchair    MEDS:  Reviewed in EPIC  ALLERGIES: Nitrofurantoin, Levaquin [levofloxacin in d5w], Brimonidine, Cephalexin , Erythromycin ethylsuccinate, and Oxycodone   SH:  ***  ROS  PHYSICAL EXAMINATION:    LMP 05/10/1992  Comment: partial    General appearance: alert, cooperative and appears stated age Neck: no adenopathy, supple, symmetrical, trachea midline and thyroid  {CHL AMB PHY EX THYROID  NORM DEFAULT:[phone]::normal to inspection and palpation} CV:  {Exam; heart brief:31539} Lungs:  {pe lungs ob:314451} Breasts: {Exam; breast:13139::normal appearance, no masses or tenderness} Abdomen: soft, non-tender; bowel sounds normal; no masses,  no organomegaly Lymph:  no inguinal LAD noted  Pelvic: External genitalia:  no lesions              Urethra:  normal appearing urethra with no masses, tenderness or lesions              Bartholins and Skenes: normal                 Vagina: {exam; pelvic vaginal:30846}              Cervix: {CHL AMB PHY EX CERVIX NORM DEFAULT:[phone]::no lesions}              Bimanual Exam:  Uterus:  {CHL AMB PHY EX UTERUS NORM DEFAULT:[phone]::normal size, contour, position, consistency, mobility, non-tender}  Adnexa: {CHL AMB PHY EX ADNEXA NO MASS [phone]::no mass, fullness, tenderness}              Rectovaginal: {yes no:[id number]}.  Confirms.              Anus:  normal sphincter tone, no lesions  Chaperone was present for exam.  Assessment/Plan: There are no diagnoses linked to this encounter.

## 2024-04-21 ENCOUNTER — Other Ambulatory Visit: Payer: Self-pay | Admitting: Family Medicine

## 2024-04-22 ENCOUNTER — Other Ambulatory Visit: Payer: Self-pay | Admitting: Family Medicine

## 2024-04-28 ENCOUNTER — Other Ambulatory Visit: Payer: Self-pay | Admitting: Family Medicine

## 2024-04-30 ENCOUNTER — Telehealth: Payer: Self-pay | Admitting: Family Medicine

## 2024-04-30 ENCOUNTER — Other Ambulatory Visit: Payer: Self-pay

## 2024-04-30 ENCOUNTER — Other Ambulatory Visit: Payer: Self-pay | Admitting: Family Medicine

## 2024-04-30 MED ORDER — TEMAZEPAM 15 MG PO CAPS: 15.0000 mg | ORAL_CAPSULE | Freq: Every day | ORAL | 0 refills | Status: DC

## 2024-04-30 MED ORDER — TEMAZEPAM 15 MG PO CAPS: 15.0000 mg | ORAL_CAPSULE | Freq: Every day | ORAL | 5 refills | Status: AC

## 2024-04-30 NOTE — Telephone Encounter (Signed)
 This was refilled today   Copied from CRM #[id number]. Topic: Clinical - Prescription Issue >> Apr 30, 2024  9:11 AM Wess RAMAN wrote: Reason for CRM: Patient states there has been a delay in her temazepam  (RESTORIL ) 15 MG capsule  Callback #: [id number]  Pharmacy: [facility] [address], [place] - [address] [place] [address] Phone: [phone] Fax: [phone] Hours: Not open 24 hours

## 2024-04-30 NOTE — Telephone Encounter (Signed)
 Patient called in in regards to temazepam  being sent to pharmacy. It looks like it was prescribed on 04/23/2024,however it saysprint instead of being sent electronically to the pharmacy.

## 2024-04-30 NOTE — Telephone Encounter (Signed)
 Spoke with patient and let her know this has been sent to Dr. Katrinka for review. He is currently in clinic and will refill this once he is done seeing patients for the day. It is ultimately up to the pharmacy if it gets filled today or not.

## 2024-04-30 NOTE — Telephone Encounter (Signed)
 Refilled today.   Copied from CRM #[id number]. Topic: Clinical - Prescription Issue >> Apr 30, 2024 12:35 PM Aleatha BROCKS wrote: Reason for CRM: [facility] pharmacy calling about the temazepam  (RESTORIL ) 15 MG capsule [Pharmacy Med Name: Temazepam  15 MG Oral Capsule] [[id number]] says patient hasn't had it filled since 11/16   [facility] [address], [place] - [address] [place] [address] Phone: [phone] Fax: [phone] Hours: Not open 24 hours

## 2024-04-30 NOTE — Telephone Encounter (Signed)
 Please advise as refill has already been sent today, do you see any issues on your end that I cannot see?   Copied from CRM #[id number]. Topic: Clinical - Prescription Issue >> Apr 30, 2024 12:35 PM Aleatha BROCKS wrote: Reason for CRM: [facility] pharmacy calling about the temazepam  (RESTORIL ) 15 MG capsule [Pharmacy Med Name: Temazepam  15 MG Oral Capsule] [[id number]] says patient hasn't had it filled since 11/16   [facility] [address], [place] - [address] [place] [address] Phone: [phone] Fax: [phone] Hours: Not open 24 hours >> Apr 30, 2024  3:52 PM Macario HERO wrote: Patient called said she did not receive her refill for her medication and will need it tonight. Advised to check with pharmacy.

## 2024-04-30 NOTE — Telephone Encounter (Signed)
 We sent later- did she receive?

## 2024-05-05 ENCOUNTER — Other Ambulatory Visit: Payer: Self-pay | Admitting: Family Medicine

## 2024-05-13 ENCOUNTER — Other Ambulatory Visit: Payer: Self-pay | Admitting: Family Medicine

## 2024-05-16 ENCOUNTER — Ambulatory Visit: Payer: Self-pay

## 2024-05-16 NOTE — Telephone Encounter (Signed)
 FYI Only or Action Required?: FYI only for provider: appointment scheduled on 05/17/24.  Patient was last seen in primary care on 04/02/2024 by Katrinka Garnette KIDD, MD.  Called Nurse Triage reporting Tingling.  Symptoms began several days ago.  Interventions attempted: Nothing.  Symptoms are: stable.  Triage Disposition: See PCP When Office is Open (Within 3 Days)  Patient/caregiver understands and will follow disposition?: Yes   Reason for Disposition  [1] Numbness or tingling on both sides of body AND [2] is a new symptom present > 24 hours  Answer Assessment - Initial Assessment Questions Neuropathy in feet in and legs.   1. SYMPTOM: What is the main symptom you are concerned about? (e.g., weakness, numbness)     Right and left hand tingling  2. ONSET: When did this start? (e.g., minutes, hours, days; while sleeping)     Last couple of days  3. LAST NORMAL: When was the last time you (the patient) were normal (no symptoms)?     Unsure  4. PATTERN Does this come and go, or has it been constant since it started?  Is it present now?     Unsure  5. CARDIAC SYMPTOMS: Have you had any of the following symptoms: chest pain, difficulty breathing, palpitations?     Denies  6. NEUROLOGIC SYMPTOMS: Have you had any of the following symptoms: headache, dizziness, vision loss, double vision, changes in speech, unsteady on your feet?     Blurred vision, cannot recall things as quickly, has to search for words sometimes last couple of weeks. Feels discomfort behind left ear at times  7. OTHER SYMPTOMS: Do you have any other symptoms?     Blood sugar concerns, today was 146.  Protocols used: Neurologic [facility]  Copied from CRM #[id number]. Topic: Clinical - Red Word Triage >> May 16, 2024 12:58 PM Rea ORN wrote: Red Word that prompted transfer to Nurse Triage: increasing blood sugar. 146 but it is typically below 100. Pt starting to have tingling in arm, doesn't feel  well and stated a change in her vision. Pt is asking for soonest available afternoon appt.

## 2024-05-16 NOTE — Telephone Encounter (Signed)
 Patient scheduled to see Dr. Katrinka 05/18/2023. Dr. Katrinka will review triage notes.

## 2024-05-17 ENCOUNTER — Ambulatory Visit: Admitting: Family Medicine

## 2024-05-17 ENCOUNTER — Other Ambulatory Visit: Payer: Self-pay | Admitting: Family Medicine

## 2024-05-17 ENCOUNTER — Encounter: Payer: Self-pay | Admitting: Family Medicine

## 2024-05-17 VITALS — BP 118/72 | HR 88 | Temp 97.7°F | Ht 72.0 in

## 2024-05-17 DIAGNOSIS — F439 Reaction to severe stress, unspecified: Secondary | ICD-10-CM | POA: Diagnosis not present

## 2024-05-17 DIAGNOSIS — R809 Proteinuria, unspecified: Secondary | ICD-10-CM | POA: Diagnosis not present

## 2024-05-17 DIAGNOSIS — I10 Essential (primary) hypertension: Secondary | ICD-10-CM | POA: Diagnosis not present

## 2024-05-17 DIAGNOSIS — E1129 Type 2 diabetes mellitus with other diabetic kidney complication: Secondary | ICD-10-CM | POA: Diagnosis not present

## 2024-05-17 DIAGNOSIS — Z7985 Long-term (current) use of injectable non-insulin antidiabetic drugs: Secondary | ICD-10-CM | POA: Diagnosis not present

## 2024-05-17 NOTE — Patient Instructions (Addendum)
 Lets try Tresiba  86 units- if you get any sugars under 80 let me know. You are planning on trying a supplement as well and if sugars start dropping with that we may need to reduce Tresiba  as well -if sugars trend down let me know and we can reduce doses as needed- definitely want to avoid lows  baseline discrepancy between right and left eye and I wonder if blurry vision is when sugar is running higher- I asked her to check next time that happens. Also schedule follow up with optho as soon as possible so they can look in the eyes  Recommended follow up: Return for next already scheduled visit or sooner if needed.

## 2024-05-17 NOTE — Progress Notes (Signed)
 " Phone [phone] In person visit   Subjective:   [name] is a 84 y.o. year old very pleasant female patient who presents for/with See problem oriented charting Chief Complaint  Patient presents with   Tingling    Bilateral hand tingling she does not know for how long; no appetite; dry flaky skin all over body;    Blood Sugar Concerns    Has noticed blood sugar has been climbing up; her vision is getting slightly worse and more blurry, vision test completed;     Past Medical History-  Patient Active Problem List   Diagnosis Date Noted   Perineal sinus 03/25/2021    Priority: High   Chronic UTI 02/23/2021    Priority: High   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 11/22/2019    Priority: High   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    Priority: High   Bronchiectasis (HCC) 10/18/2015    Priority: High   Recurrent major depression in full remission 01/03/2014    Priority: High   Diabetes mellitus with microalbuminuria (HCC) 12/28/2006    Priority: High   Chronic diastolic heart failure (HCC) 09/02/2021    Priority: Medium    Dyspnea on exertion 02/22/2019    Priority: Medium    Adenomatous polyp 12/20/2018    Priority: Medium    History of colonic polyps 05/18/2017    Priority: Medium    Aortic atherosclerosis 02/16/2016    Priority: Medium    Solitary pulmonary nodule 01/08/2016    Priority: Medium    IBS (irritable bowel syndrome) 07/17/2014    Priority: Medium    Essential hypertension 01/03/2014    Priority: Medium    Primary open-angle glaucoma 08/02/2012    Priority: Medium    Fatty liver 03/18/2009    Priority: Medium    Hyperlipidemia 07/26/2007    Priority: Medium    ANEMIA, B12 DEFICIENCY 12/29/2006    Priority: Medium    RESTLESS LEG SYNDROME, MILD 12/28/2006    Priority: Medium    Osteoporosis 12/12/2006    Priority: Medium    Pain due to onychomycosis of toenails of both feet 11/08/2018    Priority: Low   History of total knee  replacement, bilateral 11/14/2017    Priority: Low   Arthritis of midfoot 03/03/2016    Priority: Low   Hammertoes of both feet 03/03/2016    Priority: Low   Posterior tibial tendon dysfunction 01/03/2014    Priority: Low   Osteoarthritis, knee 01/03/2014    Priority: Low   Glaucoma 01/03/2014    Priority: Low   Obesity (BMI 30-39.9) 06/15/2013    Priority: Low   Foot tendinitis 07/20/2010    Priority: Low   INSOMNIA, CHRONIC 07/25/2009    Priority: Low   GERD 12/25/2007    Priority: Low   Idiopathic peripheral neuropathy 12/28/2006    Priority: Low   BREAST CANCER, HX OF 12/12/2006    Priority: Low   Abnormal CT scan, colon 08/30/2023   Polyp of transverse colon 08/30/2023   Polyp of rectum 08/30/2023   Chronic cystitis 11/12/2022   Rectal fistula 11/12/2022   Chronic rhinitis 09/14/2022   Right lower quadrant abdominal pain 06/16/2021   Hoarseness 10/17/2020   Physical deconditioning 02/22/2019    Medications- reviewed and updated Current Outpatient Medications  Medication Sig Dispense Refill   ACCU-CHEK GUIDE TEST test strip USE 1 STRIP ONCE DAILY TO  CHECK  GLUCOSE. 100 each 0   albuterol  (VENTOLIN  HFA) 108 (90 Base) MCG/ACT inhaler INHALE  2 PUFFS BY MOUTH EVERY 6 HOURS AS NEEDED FOR WHEEZING OR SHORTNESS OF BREATH 18 g 0   atenolol  (TENORMIN ) 25 MG tablet TAKE 1 TABLET BY MOUTH ONCE DAILY . APPOINTMENT REQUIRED FOR FUTURE REFILLS 90 tablet 0   Bacillus Coagulans-Inulin (ALIGN PREBIOTIC-PROBIOTIC PO) Take 1 capsule by mouth daily.      Blood Glucose Monitoring Suppl (ACCU-CHEK GUIDE) w/Device KIT Use to test blood sugars daily. Dx: E11.9 1 kit 3   CAPVAXIVE 0.5 ML injection      cetirizine (ZYRTEC) 10 MG tablet Take 10 mg by mouth every morning.      Cholecalciferol  (VITAMIN D3) 125 MCG (5000 UT) TABS Take 5,000 Units by mouth daily.     Coenzyme Q10 (COQ10) 200 MG CAPS Take 200 mg by mouth daily.     desvenlafaxine  (PRISTIQ ) 50 MG 24 hr tablet Take 1 tablet (50 mg  total) by mouth daily. 90 tablet 3   dorzolamide -timolol  (COSOPT ) 22.3-6.8 MG/ML ophthalmic solution Place 1 drop into both eyes 2 (two) times daily.      EMBECTA PEN NEEDLE NANO 2 GEN 32G X 4 MM MISC USE AS DIRECTED DAILY 100 each 0   estradiol  (ESTRING ) 7.5 MCG/24HR vaginal ring Place 1 each vaginally every 3 (three) months. follow package directions 1 each 3   FIBER PO Take 2 tablets by mouth every evening.     fluticasone  (FLONASE ) 50 MCG/ACT nasal spray Use 1 spray(s) in each nostril once daily 16 g 2   fosfomycin (MONUROL) 3 g PACK Take 3 g by mouth once. Once per month on external instructions- she reports was told once every 10 days per urology     glimepiride  (AMARYL ) 4 MG tablet Take 1 tablet (4 mg total) by mouth daily with breakfast. 180 tablet 3   ipratropium (ATROVENT ) 0.03 % nasal spray Place 2 sprays into both nostrils 2 (two) times daily as needed for rhinitis. 30 mL 5   ketoconazole (NIZORAL) 2 % shampoo SMARTSIG:liberally Topical 3 Times a Week     Lancets Misc. (ACCU-CHEK FASTCLIX LANCET) KIT Use to test blood sugars daily. Dx: E11.9 1 kit 5   latanoprost  (XALATAN ) 0.005 % ophthalmic solution Place 1 drop into the right eye at bedtime.      loperamide  (IMODIUM ) 2 MG capsule Take 2 capsules (4 mg total) by mouth 3 (three) times daily as needed for diarrhea or loose stools. 12 capsule 0   lovastatin  (MEVACOR ) 10 MG tablet Take 1 tablet by mouth once daily 90 tablet 0   methenamine  (HIPREX ) 1 g tablet Take 1 tablet (1 g total) by mouth 2 (two) times daily with a meal. 60 tablet 11   montelukast  (SINGULAIR ) 10 MG tablet TAKE 1 TABLET BY MOUTH AT BEDTIME . APPOINTMENT REQUIRED FOR FUTURE REFILLS 30 tablet 11   Multiple Vitamin (MULTIVITAMIN WITH MINERALS) TABS tablet Take 1 tablet by mouth daily. 3 Fruit Capsules 3 Vegetable Capsules - Balance of Nature 30 tablet 0   MYRBETRIQ 50 MG TB24 tablet Take 1 tablet by mouth once daily 30 tablet 0   OZEMPIC , 1 MG/DOSE, 4 MG/3ML SOPN INJECT 1  MG  SUBCUTANEOUSLY ONCE A WEEK 3 mL 0   Selenium 200 MCG CAPS Take 200 mcg by mouth at bedtime.      SYMBICORT  80-4.5 MCG/ACT inhaler Inhale 2 puffs by mouth twice daily 11 g 5   temazepam  (RESTORIL ) 15 MG capsule Take 1 capsule (15 mg total) by mouth at bedtime. 30 capsule 5   terconazole  (  TERAZOL 7 ) 0.4 % vaginal cream Place 1 applicator vaginally at bedtime. 45 g 0   TRESIBA  FLEXTOUCH 200 UNIT/ML FlexTouch Pen INJECT 80 UNITS SUBCUTANEOUSLY IN THE MORNING 9 mL 0   valsartan  (DIOVAN ) 80 MG tablet Take 1 tablet by mouth once daily 90 tablet 0   vitamin B-12 (CYANOCOBALAMIN ) 1000 MCG tablet Take 1,000 mcg by mouth daily.     VYZULTA 0.024 % SOLN Place 1 drop into the left eye at bedtime.     fluconazole  (DIFLUCAN ) 150 MG tablet TAKE ONE TABLET BY MOUTH AS A ONE-TIME DOSE REPEAT  IN  48  HOURS  IF  SYMPTOMS  ARE  NOT  COMPLETELY  RESOLVED (Patient not taking: Reported on 05/17/2024) 2 tablet 0   No current facility-administered medications for this visit.     Objective:  BP 118/72 (BP Location: Left Arm, Patient Position: Sitting, Cuff Size: Normal)   Pulse 88   Temp 97.7 F (36.5 C) (Temporal)   Ht 6' (1.829 m)   LMP 05/10/1992 Comment: partial  SpO2 93%   BMI 33.91 kg/m  Gen: NAD, resting comfortably CV: RRR no murmurs rubs or gallops Lungs: CTAB no crackles, wheeze, rhonchi.  Ext: no edema Skin: warm, dry Neuro: grossly normal, moves all extremities Tinel and phalen test negative on wrists- in regards to tingling into hands  Baseline vision loss in left eye though- so stable we believe Vision Screening   Right eye Left eye Both eyes  Without correction 20/30 20/100 20/40  With correction          Assessment and Plan   # Social update/severe stressors-their son is back in rehab again for drug addiction and they found out they have been lied to about his apparent progress  -We discussed how stress can certainly affect blood sugar-husband reports this is when a lot of the  changes have occurred    # Change in vision S: Patient feels like her vision is getting slightly worse and more blurry. Left eye worse at baseline A/P: baseline discrepancy between right and left eye and I wonder if blurry vision is when sugar is running higher- I asked her to check next time that happens. Also schedule follow up with optho as soon as possible so they can look in the eyes  # Diabetes with microalbuminuria S: Medication:Glimepiride  8 mg daily, Ozempic  1 mg, Tresiba  84 units -For microalbuminuria valsartan  80 mg-hypotensive on 160 mg.  Extensive UTI history so not great candidate for Jardiance.  Currently would also lower blood pressure likely so options are limited CBGs- feels blood sugars trending up- had been below 100 but trending up most recently into 140's or 130s -feels hands tingling more as sugars have gone up and skin feels dry. Appetite is low.  Exercise and diet- diet did worsen over holiday- had more peanut brittle but otherwise stable   Lab Results  Component Value Date   HGBA1C 6.4 04/02/2024   HGBA1C 6.8 (H) 11/29/2023   HGBA1C 6.8 (H) 06/14/2023  A/P: diabetes has been well controlled but lately sugars trending up- Lets try Tresiba  86 units- if you get any sugars under 80 let me know. You are planning on trying a supplement as well and if sugars start dropping with that we may need to reduce Tresiba  as well -too soon for a1c repeat -if sugars higher could cause some dehydration/dry skin -Offered updated work such as CBC, CMP, TSH but she declined -Microalbuminuria noted-see discussion about possible blood pressure changes under  hypertension section   #hypertension S: medication: Atenolol  25 mg daily, valsartan  80 mg daily A/P: Blood pressure is well-controlled-microalbumin to creatinine ratio mildly elevated we discussed possibly increasing valsartan  but with all the other stressors she has had lately we opted to hold steady for now-continue current  medication   Recommended follow up: Return for next already scheduled visit or sooner if needed. Future Appointments  Date Time Provider Department Center  07/09/2024  3:00 PM Katrinka Garnette KIDD, MD [facility]  08/15/2024  2:15 PM Cleotilde Ronal RAMAN, MD [facility] [phone] [facility]    Lab/Order associations:   ICD-10-CM   1. Essential hypertension  I10     2. Diabetes mellitus with microalbuminuria (HCC)  E11.29    R80.9     3. Stress  F43.9       No orders of the defined types were placed in this encounter.   Return precautions advised.  Garnette Katrinka, MD  "

## 2024-05-26 ENCOUNTER — Other Ambulatory Visit: Payer: Self-pay | Admitting: Urology

## 2024-05-28 ENCOUNTER — Encounter: Payer: Self-pay | Admitting: Family Medicine

## 2024-05-28 ENCOUNTER — Other Ambulatory Visit: Payer: Self-pay | Admitting: Family Medicine

## 2024-05-29 MED ORDER — PRAMIPEXOLE DIHYDROCHLORIDE 0.5 MG PO TABS: 0.5000 mg | ORAL_TABLET | Freq: Every day | ORAL | 11 refills | Status: AC

## 2024-06-03 ENCOUNTER — Other Ambulatory Visit: Payer: Self-pay | Admitting: Family Medicine

## 2024-06-07 ENCOUNTER — Ambulatory Visit: Admitting: Podiatry

## 2024-06-08 ENCOUNTER — Other Ambulatory Visit: Payer: Self-pay | Admitting: Adult Health

## 2024-06-08 DIAGNOSIS — R0602 Shortness of breath: Secondary | ICD-10-CM

## 2024-06-11 ENCOUNTER — Encounter: Payer: Self-pay | Admitting: Family Medicine

## 2024-06-13 ENCOUNTER — Ambulatory Visit: Admitting: Podiatry

## 2024-06-14 ENCOUNTER — Other Ambulatory Visit (HOSPITAL_BASED_OUTPATIENT_CLINIC_OR_DEPARTMENT_OTHER): Payer: Self-pay

## 2024-06-14 MED ORDER — FLUCONAZOLE 150 MG PO TABS: ORAL_TABLET | ORAL | 0 refills | Status: AC

## 2024-06-14 NOTE — Progress Notes (Signed)
 Patient left a message on the nurse line requesting medication for yeast infection.  Spoke with Dr.Miller. Prescription for Diflucan  150 mg po x1 repeat in 72 hours if symptoms persist sent to [facility] off [address]. Spoke with patient. Patient verbalizes understanding.

## 2024-06-19 ENCOUNTER — Ambulatory Visit: Admitting: Podiatry

## 2024-07-09 ENCOUNTER — Ambulatory Visit: Admitting: Family Medicine

## 2024-08-15 ENCOUNTER — Ambulatory Visit (HOSPITAL_BASED_OUTPATIENT_CLINIC_OR_DEPARTMENT_OTHER): Admitting: Obstetrics & Gynecology
